# Patient Record
Sex: Male | Born: 1935 | Race: White | Hispanic: No | Marital: Married | Smoking: Never smoker
Health system: Southern US, Community
[De-identification: ages and names within clinical notes are randomized; demographics above are authoritative.]

## PROBLEM LIST (undated history)

## (undated) DIAGNOSIS — IMO0001 Reserved for inherently not codable concepts without codable children: Secondary | ICD-10-CM

## (undated) DIAGNOSIS — I639 Cerebral infarction, unspecified: Secondary | ICD-10-CM

## (undated) DIAGNOSIS — N189 Chronic kidney disease, unspecified: Secondary | ICD-10-CM

## (undated) DIAGNOSIS — I5022 Chronic systolic (congestive) heart failure: Secondary | ICD-10-CM

## (undated) DIAGNOSIS — I340 Nonrheumatic mitral (valve) insufficiency: Secondary | ICD-10-CM

## (undated) DIAGNOSIS — I2510 Atherosclerotic heart disease of native coronary artery without angina pectoris: Secondary | ICD-10-CM

## (undated) DIAGNOSIS — C801 Malignant (primary) neoplasm, unspecified: Secondary | ICD-10-CM

## (undated) DIAGNOSIS — K219 Gastro-esophageal reflux disease without esophagitis: Secondary | ICD-10-CM

## (undated) DIAGNOSIS — M7120 Synovial cyst of popliteal space [Baker], unspecified knee: Secondary | ICD-10-CM

## (undated) DIAGNOSIS — J90 Pleural effusion, not elsewhere classified: Secondary | ICD-10-CM

## (undated) DIAGNOSIS — Z85038 Personal history of other malignant neoplasm of large intestine: Secondary | ICD-10-CM

## (undated) DIAGNOSIS — I219 Acute myocardial infarction, unspecified: Secondary | ICD-10-CM

## (undated) DIAGNOSIS — H269 Unspecified cataract: Secondary | ICD-10-CM

## (undated) DIAGNOSIS — I10 Essential (primary) hypertension: Secondary | ICD-10-CM

## (undated) DIAGNOSIS — I509 Heart failure, unspecified: Secondary | ICD-10-CM

## (undated) DIAGNOSIS — L0390 Cellulitis, unspecified: Secondary | ICD-10-CM

## (undated) DIAGNOSIS — E785 Hyperlipidemia, unspecified: Secondary | ICD-10-CM

## (undated) DIAGNOSIS — J810 Acute pulmonary edema: Secondary | ICD-10-CM

## (undated) DIAGNOSIS — Z5189 Encounter for other specified aftercare: Secondary | ICD-10-CM

## (undated) DIAGNOSIS — D649 Anemia, unspecified: Secondary | ICD-10-CM

## (undated) DIAGNOSIS — I255 Ischemic cardiomyopathy: Secondary | ICD-10-CM

## (undated) DIAGNOSIS — F419 Anxiety disorder, unspecified: Secondary | ICD-10-CM

## (undated) DIAGNOSIS — I350 Nonrheumatic aortic (valve) stenosis: Secondary | ICD-10-CM

## (undated) HISTORY — DX: Ischemic cardiomyopathy: I25.5

## (undated) HISTORY — DX: Hyperlipidemia, unspecified: E78.5

## (undated) HISTORY — PX: CATARACT EXTRACTION: SUR2

## (undated) HISTORY — DX: Nonrheumatic aortic (valve) stenosis: I35.0

## (undated) HISTORY — PX: CARDIAC CATHETERIZATION: SHX172

## (undated) HISTORY — DX: Personal history of other malignant neoplasm of large intestine: Z85.038

## (undated) HISTORY — DX: Atherosclerotic heart disease of native coronary artery without angina pectoris: I25.10

## (undated) HISTORY — PX: COLONOSCOPY: SHX174

## (undated) HISTORY — PX: TONSILLECTOMY: SUR1361

## (undated) HISTORY — PX: ESOPHAGEAL DILATION: SHX303

## (undated) HISTORY — DX: Chronic systolic (congestive) heart failure: I50.22

## (undated) HISTORY — DX: Essential (primary) hypertension: I10

## (undated) HISTORY — DX: Cerebral infarction, unspecified: I63.9

---

## 1994-12-11 HISTORY — PX: COLON RESECTION: SHX5231

## 1998-11-06 ENCOUNTER — Inpatient Hospital Stay (HOSPITAL_COMMUNITY): Admission: EM | Admit: 1998-11-06 | Discharge: 1998-11-08 | Payer: Self-pay | Admitting: Emergency Medicine

## 1998-11-06 ENCOUNTER — Encounter: Payer: Self-pay | Admitting: Emergency Medicine

## 1998-11-07 ENCOUNTER — Encounter: Payer: Self-pay | Admitting: Family Medicine

## 1999-10-13 ENCOUNTER — Encounter (INDEPENDENT_AMBULATORY_CARE_PROVIDER_SITE_OTHER): Payer: Self-pay | Admitting: Specialist

## 1999-10-13 ENCOUNTER — Ambulatory Visit (HOSPITAL_COMMUNITY): Admission: RE | Admit: 1999-10-13 | Discharge: 1999-10-13 | Payer: Self-pay | Admitting: Gastroenterology

## 2000-09-10 DIAGNOSIS — I639 Cerebral infarction, unspecified: Secondary | ICD-10-CM

## 2000-09-10 HISTORY — DX: Cerebral infarction, unspecified: I63.9

## 2000-09-11 ENCOUNTER — Inpatient Hospital Stay (HOSPITAL_COMMUNITY): Admission: EM | Admit: 2000-09-11 | Discharge: 2000-09-13 | Payer: Self-pay | Admitting: Emergency Medicine

## 2000-09-12 ENCOUNTER — Encounter: Payer: Self-pay | Admitting: Neurology

## 2000-09-13 ENCOUNTER — Encounter: Payer: Self-pay | Admitting: Pediatrics

## 2000-09-14 ENCOUNTER — Inpatient Hospital Stay (HOSPITAL_COMMUNITY): Admission: EM | Admit: 2000-09-14 | Discharge: 2000-09-20 | Payer: Self-pay | Admitting: Emergency Medicine

## 2000-09-14 ENCOUNTER — Encounter: Payer: Self-pay | Admitting: Emergency Medicine

## 2000-09-15 ENCOUNTER — Encounter: Payer: Self-pay | Admitting: Neurology

## 2000-09-17 ENCOUNTER — Encounter: Payer: Self-pay | Admitting: Pediatrics

## 2000-09-20 ENCOUNTER — Inpatient Hospital Stay (HOSPITAL_COMMUNITY)
Admission: AD | Admit: 2000-09-20 | Discharge: 2000-09-27 | Payer: Self-pay | Admitting: Physical Medicine & Rehabilitation

## 2000-09-25 ENCOUNTER — Encounter: Payer: Self-pay | Admitting: Physical Medicine & Rehabilitation

## 2000-10-02 ENCOUNTER — Encounter
Admission: RE | Admit: 2000-10-02 | Discharge: 2000-10-25 | Payer: Self-pay | Admitting: Physical Medicine & Rehabilitation

## 2000-10-15 ENCOUNTER — Encounter: Payer: Self-pay | Admitting: Physical Medicine & Rehabilitation

## 2000-10-15 ENCOUNTER — Ambulatory Visit (HOSPITAL_COMMUNITY)
Admission: RE | Admit: 2000-10-15 | Discharge: 2000-10-15 | Payer: Self-pay | Admitting: Physical Medicine & Rehabilitation

## 2001-07-25 ENCOUNTER — Encounter: Payer: Self-pay | Admitting: Otolaryngology

## 2001-07-25 ENCOUNTER — Encounter: Admission: RE | Admit: 2001-07-25 | Discharge: 2001-07-25 | Payer: Self-pay | Admitting: Otolaryngology

## 2001-07-26 ENCOUNTER — Ambulatory Visit (HOSPITAL_BASED_OUTPATIENT_CLINIC_OR_DEPARTMENT_OTHER): Admission: RE | Admit: 2001-07-26 | Discharge: 2001-07-26 | Payer: Self-pay | Admitting: Otolaryngology

## 2002-12-13 ENCOUNTER — Encounter: Payer: Self-pay | Admitting: Emergency Medicine

## 2002-12-13 ENCOUNTER — Emergency Department (HOSPITAL_COMMUNITY): Admission: EM | Admit: 2002-12-13 | Discharge: 2002-12-13 | Payer: Self-pay | Admitting: Emergency Medicine

## 2004-07-01 ENCOUNTER — Encounter: Admission: RE | Admit: 2004-07-01 | Discharge: 2004-07-01 | Payer: Self-pay | Admitting: Otolaryngology

## 2004-07-04 ENCOUNTER — Ambulatory Visit (HOSPITAL_BASED_OUTPATIENT_CLINIC_OR_DEPARTMENT_OTHER): Admission: RE | Admit: 2004-07-04 | Discharge: 2004-07-04 | Payer: Self-pay | Admitting: Otolaryngology

## 2004-07-04 ENCOUNTER — Ambulatory Visit (HOSPITAL_COMMUNITY): Admission: RE | Admit: 2004-07-04 | Discharge: 2004-07-04 | Payer: Self-pay | Admitting: Otolaryngology

## 2007-12-12 HISTORY — PX: EYE SURGERY: SHX253

## 2008-01-21 ENCOUNTER — Encounter: Payer: Self-pay | Admitting: Family Medicine

## 2008-01-21 ENCOUNTER — Ambulatory Visit: Payer: Self-pay

## 2008-02-06 ENCOUNTER — Ambulatory Visit: Payer: Self-pay | Admitting: Cardiology

## 2009-02-03 ENCOUNTER — Encounter: Payer: Self-pay | Admitting: Cardiology

## 2009-02-03 ENCOUNTER — Ambulatory Visit: Payer: Self-pay | Admitting: Cardiology

## 2009-02-03 DIAGNOSIS — I359 Nonrheumatic aortic valve disorder, unspecified: Secondary | ICD-10-CM | POA: Insufficient documentation

## 2009-02-03 DIAGNOSIS — I119 Hypertensive heart disease without heart failure: Secondary | ICD-10-CM

## 2009-03-23 ENCOUNTER — Ambulatory Visit: Payer: Self-pay | Admitting: Vascular Surgery

## 2009-03-24 ENCOUNTER — Ambulatory Visit (HOSPITAL_COMMUNITY): Admission: RE | Admit: 2009-03-24 | Discharge: 2009-03-24 | Payer: Self-pay | Admitting: Ophthalmology

## 2009-11-24 ENCOUNTER — Encounter (INDEPENDENT_AMBULATORY_CARE_PROVIDER_SITE_OTHER): Payer: Self-pay | Admitting: *Deleted

## 2010-01-14 ENCOUNTER — Ambulatory Visit: Payer: Self-pay | Admitting: Cardiology

## 2010-01-27 ENCOUNTER — Ambulatory Visit (HOSPITAL_COMMUNITY): Admission: RE | Admit: 2010-01-27 | Discharge: 2010-01-27 | Payer: Self-pay | Admitting: Cardiology

## 2010-01-27 ENCOUNTER — Ambulatory Visit: Payer: Self-pay

## 2010-01-27 ENCOUNTER — Ambulatory Visit: Payer: Self-pay | Admitting: Cardiovascular Disease

## 2010-01-27 ENCOUNTER — Encounter: Payer: Self-pay | Admitting: Cardiology

## 2011-01-12 NOTE — Assessment & Plan Note (Signed)
Summary: F1Y/ANAS   Visit Type:  1 yr f/u Primary Provider:  Jacalyn Lefevre  CC:  no cardiac complaints today.  History of Present Illness: Mr Schroepfer returns today for evaluation and management of his moderate aortic stenosis. He is extremely active and denies any angina, presyncope or syncope with exertion, or significant dyspnea on exertion.  His blood pressure is 100 better control. He denies any palpitations.  Current Medications (verified): 1)  Prazosin Hcl 1 Mg Caps (Prazosin Hcl) .Marland Kitchen.. 1 Cap Two Times A Day 2)  Plavix 75 Mg Tabs (Clopidogrel Bisulfate) .Marland Kitchen.. 1 Tab Qd 3)  Metipranolol 0.3 % Soln (Metipranolol) .Marland Kitchen.. 1 Gtt Each Eye Once Daily 4)  Prilosec Otc 20 Mg Tbec (Omeprazole Magnesium) .Marland Kitchen.. 1 Tab Qd 5)  Amlodipine Besylate 5 Mg Tabs (Amlodipine Besylate) .Marland Kitchen.. 1 Tab Qam 6)  Metoprolol Succinate 50 Mg Xr24h-Tab (Metoprolol Succinate) .Marland Kitchen.. 1 Tab Qd 7)  Travatan Z 0.004 % Soln (Travoprost) .Marland Kitchen.. 1 Gtt Each Eye At Bedtime  Allergies (verified): No Known Drug Allergies  Past History:  Past Medical History: Last updated: 09-Feb-2009 Hypertension Significant for stroke on September 11, 2000 history of colon cancer, resection in 1996 dilatation of the esophagus in 2002 and 2005    Past Surgical History: Last updated: February 09, 2009 Cancer removed from colon 1995-1996? dilation of esophagus  Family History: Last updated: Feb 09, 2009 Mother: cancer..died @ 31 Father: MI.Marland Kitchendied @ 85 Siblings: Mental disorder..sister died @ 80 brother died @ 70.. diabetic and CHF  Social History: Last updated: 2009-02-09 Married  Tobacco Use - No.  Alcohol Use - no Drug Use - no  Risk Factors: Smoking Status: never (February 09, 2009)  Review of Systems       negative other than history of present illness  Vital Signs:  Patient profile:   77 year old male Height:      69 inches Weight:      171 pounds BMI:     25.34 Pulse rate:   59 / minute Pulse rhythm:   irregular BP sitting:    136 / 80  (left arm) Cuff size:   large  Vitals Entered By: Danielle Rankin, CMA (January 14, 2010 10:03 AM)  Physical Exam  General:  Well developed, well nourished, in no acute distress. Head:  normocephalic and atraumatic Eyes:  PERRLA/EOM intact; conjunctiva and lids normal. Neck:  Neck supple, no JVD. No masses, thyromegaly or abnormal cervical nodes. Chest Orlena Garmon:  no deformities or breast masses noted Lungs:  Clear bilaterally to auscultation and percussion. Heart:  aortic stenosis murmur, S2 difficult to hear split, regular rate and rhythm, PMI nondisplaced Abdomen:  Bowel sounds positive; abdomen soft and non-tender without masses, organomegaly, or hernias noted. No hepatosplenomegaly. Msk:  decreased ROM.   Pulses:  pulses normal in all 4 extremities Extremities:  No clubbing or cyanosis. Neurologic:  Alert and oriented x 3. Skin:  Intact without lesions or rashes. Psych:  Normal affect.   Impression & Recommendations:  Problem # 1:  AORTIC STENOSIS/ INSUFFICIENCY, NON-RHEUMATIC (ICD-424.1)  I will obtain a 2-D echocardiogram to evaluate his aortic stenosis. It is very difficult to hear his S2 split, but it clearly is not symptomatic at this point. His updated medication list for this problem includes:    Metoprolol Succinate 50 Mg Xr24h-tab (Metoprolol succinate) .Marland Kitchen... 1 tab qd  Orders: EKG w/ Interpretation (93000) Echocardiogram (Echo)  Problem # 2:  HYPERTENSION, HEART UNCONTROLLED W/O ASSOC CHF (ICD-402.00) Assessment: Improved  His updated medication list for this problem includes:  Prazosin Hcl 1 Mg Caps (Prazosin hcl) .Marland Kitchen... 1 cap two times a day    Amlodipine Besylate 5 Mg Tabs (Amlodipine besylate) .Marland Kitchen... 1 tab qam    Metoprolol Succinate 50 Mg Xr24h-tab (Metoprolol succinate) .Marland Kitchen... 1 tab qd  Patient Instructions: 1)  Your physician recommends that you schedule a follow-up appointment in: YEAR WITH DR Yousif Edelson  2)  Your physician recommends that you continue on  your current medications as directed. Please refer to the Current Medication list given to you today. 3)  Your physician has requested that you have an echocardiogram.  Echocardiography is a painless test that uses sound waves to create images of your heart. It provides your doctor with information about the size and shape of your heart and how well your heart's chambers and valves are working.  This procedure takes approximately one hour. There are no restrictions for this procedure.

## 2011-02-17 ENCOUNTER — Encounter (HOSPITAL_BASED_OUTPATIENT_CLINIC_OR_DEPARTMENT_OTHER)
Admission: RE | Admit: 2011-02-17 | Discharge: 2011-02-17 | Disposition: A | Discharge status: Home / Self Care | Payer: Medicare Other | Source: Ambulatory Visit | Attending: Otolaryngology | Admitting: Otolaryngology

## 2011-02-17 ENCOUNTER — Other Ambulatory Visit: Payer: Self-pay | Admitting: Otolaryngology

## 2011-02-17 ENCOUNTER — Ambulatory Visit
Admission: RE | Admit: 2011-02-17 | Discharge: 2011-02-17 | Disposition: A | Discharge status: Home / Self Care | Payer: Medicare Other | Source: Ambulatory Visit | Attending: Otolaryngology | Admitting: Otolaryngology

## 2011-02-17 DIAGNOSIS — Z01811 Encounter for preprocedural respiratory examination: Secondary | ICD-10-CM

## 2011-02-17 LAB — BASIC METABOLIC PANEL
Calcium: 9.4 mg/dL (ref 8.4–10.5)
GFR calc Af Amer: 43 mL/min — ABNORMAL LOW (ref >60)
GFR calc non Af Amer: 36 mL/min — ABNORMAL LOW (ref >60)
Glucose, Bld: 167 mg/dL — ABNORMAL HIGH (ref 70–99)
Sodium: 137 mEq/L (ref 135–145)

## 2011-02-21 ENCOUNTER — Ambulatory Visit (HOSPITAL_BASED_OUTPATIENT_CLINIC_OR_DEPARTMENT_OTHER)
Admission: RE | Admit: 2011-02-21 | Discharge: 2011-02-21 | Disposition: A | Discharge status: Home / Self Care | Payer: Medicare Other | Source: Ambulatory Visit | Attending: Otolaryngology | Admitting: Otolaryngology

## 2011-02-21 DIAGNOSIS — Z01812 Encounter for preprocedural laboratory examination: Secondary | ICD-10-CM | POA: Insufficient documentation

## 2011-02-21 DIAGNOSIS — R1310 Dysphagia, unspecified: Secondary | ICD-10-CM | POA: Insufficient documentation

## 2011-02-21 DIAGNOSIS — K222 Esophageal obstruction: Secondary | ICD-10-CM | POA: Insufficient documentation

## 2011-02-21 LAB — GLUCOSE, CAPILLARY: Glucose-Capillary: 112 mg/dL — ABNORMAL HIGH (ref 70–99)

## 2011-02-22 LAB — POCT HEMOGLOBIN-HEMACUE: Hemoglobin: 13.4 g/dL (ref 13.0–17.0)

## 2011-03-03 NOTE — Op Note (Signed)
  NAMETERIQUE, KAWABATA             ACCOUNT NO.:  [id number]  MEDICAL RECORD NO.:  [id number]            PATIENT TYPE:  LOCATION:                                 FACILITY:  PHYSICIAN:  Kristine Garbe. Ezzard Standing, M.D. DATE OF BIRTH:  DATE OF PROCEDURE:  02/21/2011 DATE OF DISCHARGE:                              OPERATIVE REPORT   PREOPERATIVE DIAGNOSIS:  Esophageal stricture.  POSTOPERATIVE DIAGNOSIS:  Esophageal stricture.  OPERATION:  Cervical esophagoscopy with dilation of esophageal stricture using the Savary dilators (dilated to a #20 Savary dilator).  SURGEON:  Kristine Garbe. Ezzard Standing, MD  ANESTHESIA:  General endotracheal.  COMPLICATIONS:  None.  BRIEF CLINICAL NOTE:  [name] [name] is an 77 year old gentleman who has had history of esophageal stricture status post dilations in 2002 and also 2005.  More recently, he has developed a progressive dysphagia with difficulty swallowing large amounts of food as well as pills.  He is taken to the operating room at this time for esophagoscopy and dilation of esophageal stricture.  DESCRIPTION OF PROCEDURE:  After adequate endotracheal anesthesia, the patient received 1 g Ancef IV preoperatively.  Cervical esophagoscopy was performed, a stricture was identified at approximate 16-17 cm from the upper lip.  The Savary guidewire was passed down through the stricture site, and then starting with a #8 Savary dilator the stricture was dilated sequentially up to a #20 Savary dilator without any difficulty.  Following dilation to a #20 Savary dilator, cervical esophagoscopy was performed and the area distal to the stricture was clear of any stricture or abnormalities.  This completed the procedure. The patient was awakened from anesthesia and transferred to recovery room postop doing well.  DISPOSITION:  Nuno was discharged home later this morning on amoxicillin suspension 400 mg b.i.d. for 1 week, Nexium 40 mg once a day for 4 weeks.   We will have him followup in my office in 2 weeks for recheck.          ______________________________ Kristine Garbe. Ezzard Standing, M.D.    CEN/MEDQ  D:  02/21/2011  T:  02/22/2011  Job:  [id number]  Electronically Signed by Dillard Cannon M.D. on 03/03/2011 11:11:56 AM

## 2011-03-17 ENCOUNTER — Encounter: Payer: Self-pay | Admitting: Cardiology

## 2011-03-23 ENCOUNTER — Ambulatory Visit (INDEPENDENT_AMBULATORY_CARE_PROVIDER_SITE_OTHER): Payer: Medicare Other | Admitting: Cardiology

## 2011-03-23 ENCOUNTER — Encounter: Payer: Self-pay | Admitting: Cardiology

## 2011-03-23 VITALS — BP 220/80 | HR 54 | Resp 14 | Ht 69.0 in | Wt 146.0 lb

## 2011-03-23 DIAGNOSIS — I119 Hypertensive heart disease without heart failure: Secondary | ICD-10-CM

## 2011-03-23 DIAGNOSIS — I359 Nonrheumatic aortic valve disorder, unspecified: Secondary | ICD-10-CM

## 2011-03-23 DIAGNOSIS — I6529 Occlusion and stenosis of unspecified carotid artery: Secondary | ICD-10-CM

## 2011-03-23 NOTE — Assessment & Plan Note (Signed)
Stable with recent Echo showing mild AS. Follow clinically.

## 2011-03-23 NOTE — Progress Notes (Signed)
   Patient ID: [name] [name], male    DOB: 26-Feb-1926, 77 y.o.   MRN: [id number]  HPI  [name] [name] returns for E and M of his aortic valve disease. He denies any angina, DOE, or syncope. He has well controlled blood pressure at home. It is almost always high in the office. Last Echocardiogram was 2/11 which showed only mild AS, moderate LVH, no AI. EF was 60%.    Review of Systems  All other systems reviewed and are negative.      Physical Exam  Nursing note and vitals reviewed. Constitutional: He is oriented to person, place, and time. He appears well-developed and well-nourished. No distress.  HENT:  Head: Normocephalic and atraumatic.  Eyes: EOM are normal. Pupils are equal, round, and reactive to light.  Neck: Normal range of motion. Neck supple. No JVD present. No tracheal deviation present. No thyromegaly present.       Bruits versus referred sounds  Cardiovascular: Regular rhythm, S1 normal, intact distal pulses and normal pulses.   No extrasystoles are present. Bradycardia present.  PMI is not displaced.  Exam reveals no S4.   Murmur heard.  Crescendo systolic murmur is present with a grade of 2/6   No diastolic murmur is present  Pulmonary/Chest: Effort normal and breath sounds normal. He has no rales.  Abdominal: Soft. Bowel sounds are normal.       No midline bruit  Musculoskeletal: He exhibits no edema.  Neurological: He is alert and oriented to person, place, and time.  Skin: Skin is warm and dry.  Psychiatric: He has a normal mood and affect.

## 2011-03-23 NOTE — Patient Instructions (Signed)
Your physician recommends that you schedule a follow-up appointment in: 1 YEAR WITH DR. WALL  Your physician has requested that you have a carotid duplex 433.10. This test is an ultrasound of the carotid arteries in your neck. It looks at blood flow through these arteries that supply the brain with blood. Allow one hour for this exam. There are no restrictions or special instructions.

## 2011-04-05 ENCOUNTER — Encounter (INDEPENDENT_AMBULATORY_CARE_PROVIDER_SITE_OTHER): Payer: Medicare Other | Admitting: *Deleted

## 2011-04-05 DIAGNOSIS — I6529 Occlusion and stenosis of unspecified carotid artery: Secondary | ICD-10-CM

## 2011-04-05 DIAGNOSIS — R0989 Other specified symptoms and signs involving the circulatory and respiratory systems: Secondary | ICD-10-CM

## 2011-04-10 ENCOUNTER — Encounter: Payer: Self-pay | Admitting: Cardiology

## 2011-04-18 ENCOUNTER — Telehealth: Payer: Self-pay | Admitting: *Deleted

## 2011-04-18 NOTE — Telephone Encounter (Signed)
Message copied by Lisabeth Devoid on Tue Apr 18, 2011  3:02 PM ------      Message from: Valera Castle      Created: Thu Apr 13, 2011  9:16 AM       Stable, ASA, Followup in 2 years

## 2011-04-18 NOTE — Telephone Encounter (Signed)
Pt aware of carotid results Debbie Nissi Doffing RN  

## 2011-04-25 NOTE — Assessment & Plan Note (Signed)
[facility]                            CARDIOLOGY OFFICE NOTE   [name] [name]                      MRN:          [id number]  DATE:02/06/2008                            DOB:          21-Dec-1925    Was asked by Dr. Bradd Canary to consult on [name] [name] with a heart  murmur.   HISTORY OF PRESENT ILLNESS:  [name] [name] is an 77 year old married  white male who has had no previous cardiac condition.  He has carried a  diagnosis of hypertension for some 30 years.   He was recently seen by Dr. Artis Flock who heard a heart murmur.   He denies any exertional dyspnea, angina, presyncope or syncope with  exertion.  He denies orthopnea, PND or peripheral edema.   PAST MEDICAL HISTORY:  Significant for stroke on September 11, 2000.  He is  on Plavix ever since and has done well.  He has no remaining dysfunction  or disability.  He has also had a history of colon cancer, resection in  1996.  He has had dilatation of the esophagus in 2002 and 2005.   His current meds are prazosin 1 mg p.o. b.i.d. Plavix 75 mg a day,  metipranolol eyedrops Prilosec over-the-counter, fluorouracil cream,  amlodipine 5 mg a day, metoprolol succinate 50 mg a day and Travatan.   He has no known drug allergies.   His family history is noncontributory.   SOCIAL HISTORY:  Retired.  He is married.  Three children.  Wife is with  him today.   He does not smoke, does not drink.   REVIEW OF SYSTEMS:  All 15 points of care reviewed and only significant  for previous ulcer, gastroesophageal reflux, and the above HPI.  He has  borderline diabetes.   EXAM:  Today his blood pressure is 151/81, pulse is 53 and sinus brady  by EKG.  Weight is 220.  He is 5 feet 9 inches.  He looks younger than stated age.  Face shows diffuse erythema from his cream for his skin cancers.  PERLA.  Extraocular intact.  He has arcus senilis.  Sclerae are slightly  injected.  Facial symmetry is normal.   Dentition satisfactory.  Neck is supple.  Carotids were equal bilaterally with a soft systolic  sound the right neck.  There is no bruit on the left.  LUNGS:  Clear.  HEART:  Reveals a nondisplaced PMI.  Soft aortic stenosis murmur along  left sternal border.  Difficult to hear S2 split.  There is no gallop.  There is no lift.  ABDOMEN:  Soft, good bowel sounds.  No midline bruits.  EXTREMITIES:  No cyanosis, clubbing or edema.  Pulses are intact.  NEURO:  Exam is intact.   Electrocardiogram shows sinus bradycardia rate of 53, a left axis  deviation with some LVH with strain pattern.   I had a nice chat with [name] [name] and his wife.  I have reviewed 2-D  echocardiogram that had been ordered prior to this visit which shows  mild to moderate aortic stenosis.  He has calcification but his mean  gradient is only 15 mmHg.  His estimated valve area was greater than 1.  He has mild mitral regurgitation.  Mild left atrial dilatation.  EF is  55 6%.  There was no significant LVH.   ASSESSMENT/PLAN:  1. Mild to moderate aortic stenosis which is currently asymptomatic.  2. Hypertension.  3. Sinus bradycardia.  4. History of a previous stroke.  5. Diet-controlled diabetes.  6. Other problems as listed above.   RECOMMENDATIONS:  At the present time I have asked [name] [name] to see  [place] back on annual basis.  He may never need to have any significant  intervention of his aortic valve.  We talked about the classic warning  signs of dyspnea on exertion, exertional angina, or presyncope or  syncope.  If any of those happen, I want to see him back sooner than a  year.  Otherwise will see him back at that time.     Thomas C. Daleen Squibb, MD, [facility]  Electronically Signed    TCW/MedQ  DD: 02/06/2008  DT: 02/07/2008  Job #: [id number]   cc:   Quita Skye. Artis Flock, M.D.

## 2011-04-28 NOTE — H&P (Signed)
[place]  Patient:    [name] [name], [name] [name]                      MRN: [id number] Adm. Date:  [id number] Attending:  Erich Montane                         History and Physical  PATIENTS ADDRESS [address] [place], [place]  [address]  DATE OF BIRTH:  [birth year].  REASON FOR ADMISSION:  This is one of several [facility] admissions for this 77 year old right-handed white married male from [place], [place], discharged 24 hours ago, and now readmitted for recurrent right-sided weakness.  HISTORY OF PRESENT ILLNESS:  Mr. Withey has a 30-year history of high blood pressure, without known diabetes mellitus, heart disease, stroke or previous neurologic symptoms.  On September 11, 2000, he developed the onset of right foot and leg dragging, stuttering speech, and blurred vision and was admitted to [facility] for IV heparin therapy.  At that time, his Doppler studies showed right internal carotid artery stenosis of 40-60%, with normal vertebral antegrade flow and normal left ICA flow.  An extracranial MRA showed no significant stenosis of the right internal carotid artery.  There was evidence of sinus bradycardia and nonspecific ST-T wave changes on his EKG.  An MRI of the brain showed atrophy and small vessel disease.  The diffusion images were negative for an acute abnormality.  There was decreased caliber to the left internal carotid artery in its vertical petrous and cavernous segments and the MRA of the brain showed evidence of the left internal carotid artery to have a long segment of 50% stenosis and intracranially, there was severe atherosclerotic disease in the middle cerebral artery branches on the left, with lack of flow into the distal branches of the left middle cerebral artery distal to the trifurcation; there was also stenosis of the posterior cerebral arteries bilaterally.  A 2-D  echocardiogram was unremarkable and a glycosylated hemoglobin A1c was 7.1, which is elevated.  His cholesterol was 138 with triglycerides of 111. HDL cholesterol was 25.  LDL was 91.  He had low HDLs.  In the hospital, he was placed on IV heparin therapy and subsequently was placed on Plavix and aspirin.  He was discharged on September 13, 2000.  At home this day, he was noted to have difficulty with slurred speech, increasing right-sided weakness, without associated headache, and a tendency to fall to his right.  There was no chest pain or palpitations and he came to the emergency room for further evaluation.  PAST MEDICAL HISTORY:  His past medical history is significant for hypertension for 30 years, left brain TIA, September 11, 2000, colon cancer, status post surgery in 1995, and appendectomy in 1945.  MEDICINES AT TIME OF ADMISSION 1. Plavix 75 mg q.d. 2. Prilosec 40 mg p.o. q.d. 3. Aspirin 325 mg q.d. 4. Prazosin 1 mg p.o. b.i.d.  FAMILY HISTORY:  His mother died at 39 from oral cancer.  His father died at 69 from myocardial infarction.  He has one brother, 68, living and well.  He has a brother, 60, with heart problems.  He has a sister, 33, and another sister, 65, living and well.  He has three children, a son, 4, and daughters, 85 and 90, living and well.  HABIT HISTORY:  He does not smoke cigarettes.  He does not drink  alcohol.  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  ALLERGIES:  He has no allergies.  PHYSICAL EXAMINATION  GENERAL:  Examination revealed a well-developed, pleasant white male with blood pressure in the right arm 190/80, left arm 180/80.  Heart rate was 60. He was afebrile.  Respiratory rate was 18.  NEUROLOGIC:  He was alert and oriented x 3.  He could name objects.  There was no evidence of any aphasia.  He had stuttering speech.  His cranial nerve examination revealed visual fields to be full.  The disks were flat.   The extraocular movements were full.  There was no seventh nerve palsy.  Tongue was midline.  The uvula was midline.  Gags were present.  Sternocleidomastoid and trapezius testing were normal.  Motor examination revealed right hand distal-greater-than-proximal drift with decreased rapid alternating movement skills in the right hand.  His sensory examination was intact to pinprick, touch, ______ and vibration testing.  Deep tendon reflexes were 2+ and plantar responses were downgoing.  HEENT:  Examination revealed an old hole in the left tympanic membrane.  NECK:  There was a loud right carotid bruit heard.  The neck was supple.  LUNGS:  Clear to auscultation.  HEART:  No murmurs.  ABDOMEN:  There was no enlargement of the liver, spleen or kidneys.  GU:  He was uncircumcised.  RECTAL:  Examination was not done since it is not pertinent to the present illness.  IMAGING STUDY:  CT scan of the brain showed ischemic stroke of the small posterior limb of the internal capsule.  IMPRESSION 1. Left brain stroke, code 434.01. 2. 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 3. Hypertension, code 796.2. 4. Colon cancer, code 153.9. 5. Right carotid bruit, code 785.9.  PLAN:  Plan at this time is to admit the patient for heparin therapy. DD:  09/14/00 TD:  09/15/00 Job: [id number]

## 2011-04-28 NOTE — Discharge Summary (Signed)
[place]  Patient:    [name] [name], [name] [name]                      MRN: [id number] Adm. Date:  [id number] Disc. Date: 09/19/00 Attending:  Erich Montane                           Discharge Summary  ADMISSION DIAGNOSES: 1. New onset of left brain stroke with aphasia and right hemiparesis. 2. History of hypertension.  DISCHARGE DIAGNOSES: 1. Left brain stroke secondary to distal middle cerebral artery occlusion. 2. History of hypertension. 3. History of colon cancer.  PROCEDURE: 1. CT scan of the brain. 2. MRI scan of the brain. 3. MRI angiogram. 4. Two-dimensional echocardiogram. 5. Cerebral angiogram.  COMPLICATIONS:  None.  HISTORY OF PRESENT ILLNESS:  [name] [name] is a 77 year old white gentleman born 1926/11/10, with a history of hypertension.  This patient presents following a recent evaluation for a TIA-type event on September 11, 2000.  This patient at that time had positive right foot and leg dragging, stuttering speech, and blurred vision.  The patient was placed on heparin therapy at that point and had Doppler study showing right internal carotid artery stenosis at 40 to 60%, vertebral artery flow was antegrade, normal left internal carotid artery flow.  An extracranial MRI angiogram showed no significant stenosis of the right internal carotid artery and MRI of the brain showed atrophy with small vessel disease.  A diffusion weight image was negative at that time. There was some evidence of decreased caliber of the left internal carotid artery in the petrous segment.  MRI angiogram intracranially showed about 50% stenosis of the carotid artery intracranially atherosclerotic disease effecting the middle cerebral branches on the left.  Two-dimensional echocardiogram was roughly unremarkable at that point.  The patient was discharged on Plavix and aspirin and was being treated with prazosin for his hypertension.  The patient,  however, came back several days after discharge with new onset of right-sided hemiparesis, aphasia, gait disturbance.  The patient was admitted for further evaluation.  PAST MEDICAL HISTORY:  Significant for: 1. History of hypertension. 2. Left brain transient ischemic attack on September 11, 2000. 3. History of colon cancer, status post surgical resection in 1995. 4. History of appendectomy in 1945.  MEDICATIONS: 1. Plavix 75 mg a day. 2. Prilosec 40 mg a day. 3. Aspirin 325 mg daily. 4. Prazosin 1 mg b.i.d.  ALLERGIES:  No known drug allergies.  SOCIAL HISTORY:  He does not smoke or drink.  Please refer to history and physical dictation for social history, family history, review of systems, and physical examination.  LABORATORY DATA:  Notable for white count of 6.0, hemoglobin 13.0, hematocrit 36.9, MCV 90.4, platelets 138.  Coags were normal on admission.  Sodium 138, potassium 3.9, chloride 106, CO2 25, glucose 121, BUN 17, creatinine 1.1, calcium 9.1, total protein 6.4, albumin 3.4, AST 36, ALT 31, ALP 49, total bilirubin 1.1.  CT scan of the brain on admission showed a nonhemorrhagic, left thallamic/internal capsular infarct.  Two-dimensional echocardiogram that apparently was done on the prior admission showed evidence of no aortic stenosis, moderate aortic sclerosis, no tricuspid regurgitation, normal tricuspid valve, no mitral reguritation, normal mitral valve, left ventricular hypertrophy was seen, normal left ventricular systolic function, no source of intracardial thrombi or emboli were seen.  EKG revealed sinus bradycardia with a heart rate of 54, otherwise normal EKG  was noted.  HOSPITAL COURSE:  The patient has done well during the course of hospitalization.  The patient was admitted for an evaluation of stroke event involving the left brain.  The patient has had right hemiparesis of face and arm greater than leg.  The patient underwent an evaluation for  possible intracranial disease.  The patient was placed on heparin.  Cerebral angiogram was performed showing evidence of an occluded inferior division branch of the left middle cerebral artery, approximately 40% stenosis of the superior division of the left middle cerebral, approximately 50% stenosis of the right posterior cerebral artery just distal to its origin with some focal stenosis distally to this as well.  The left posterior frontal and anterior parietal regions were being supplied to the distal branches of the left close marginal artery and some partial reconstitution of the angular distribution of the left middle cerebral artery was seen.   The patient was seen and evaluated by physical, occupational, and speech therapy.  A barium swallow study was performed showing tracheal aspiration of honey and lectar-thick liquids.  The patient was placed on pudding consistency diet, excluding liquids currently. This may need to be repeated in the future.  The patient was taken off of heparin following the cerebral angiogram and was placed on Plavix.  This patient has done well so far and rehabilitation has been consulted. Rehabilitation felt that he would be an excellent candidate for acute inpatient rehabilitation and will be transferred when a bed is available.  At the time of discharge, the patient is bright, alert, and cooperative.  Mild aphasia is noted.  Weakness of the right arm and face greater than leg is noted.  The patient is ambulatory with minimal assistance.  The patient has weakness of the right arm.  At the time of this dictation, the patient currently is on subcu heparin as per recommendations by rehabilitation.  He is on Plavix 75 mg a day, Pepcid 20 mg q.12h., prazosin 1 mg daily.  This may need eventually to be increased to 1 mg twice a day.  The patient is medically ready for transfer to rehabilitation unit at this point. DD:  09/19/00 TD:  09/19/00 Job:  [id number]

## 2011-04-28 NOTE — Op Note (Signed)
[place]  Patient:    [name] [name], [name] [name]                                Visit Number: [id number] MRN: [id number]          Service Type: DSU Location: [facility] Attending:  Carlean Purl Proc. Date: 07/26/01 Adm. Date:  [id number]   CC:         Stanley C. Andrey Campanile, M.D.   Operative Report  PREOPERATIVE DIAGNOSIS:  Dysphagia secondary to upper cervical esophageal stricture.  POSTOPERATIVE DIAGNOSIS:  Dysphagia secondary to upper cervical esophageal stricture.  PROCEDURE:  Direct laryngoscopy, cervical esophagoscopy with dilation of cervical esophageal stricture with Savary dilators (dilated to #20).  SURGEON:  Kristine Garbe. Ezzard Standing, M.D.  ANESTHESIA:  General endotracheal.  COMPLICATIONS:  None.  BRIEF CLINICAL NOTE:  [name] [name] is a 77 year old gentleman who has had long-standing problems with dysphagia on examination and barium swallow. 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 Gastroenterology felt that this would be better dilated under a general anesthetic versus in the endoscopy suite.  He is taken to the operating room at this time for dilation of proximal cervical esophageal stricture.  DESCRIPTION OF PROCEDURE:  After adequate anesthesia, first direct laryngoscopy was performed.  The base of tongue, epiglottis, piriform sinuses were clear.  Vocal cords likewise were clear.  Next, the cervical esophagoscope was passed.  On examination, both piriform sinuses were clear, but on attempting to enter the cervical esophagus there was a very tight stricture, less than 8 mm in size.  I was able to pass the smallest Pilling dilator through the cervical esophagoscope and down through the small opening to the cervical esophagus.  It was elected to use the Savary dilators.  The guidewire was passed down to the small opening first and then using the sequential dilators starting with a #8, the strictured area  was dilated up to #20 with the Savary dilators.  After dilating the cervical stricture, rigid esophagoscopy was performed.  The esophagoscope could now be passed through the strictured area, and the more distal esophagus appeared normal.  There was a moderate amount of bleeding around the dilated strictured area where the mucosa was torn, but there was no evidence of perforation.  This completed the procedure.  The patient was awoken from anesthesia and transferred to the recovery room postop doing well.  DISPOSITION:  [name] [name] is discharged home later this morning.  He was instructed to double his dose of Prilosec to 20 mg twice a day for the next two weeks to help reduce any incidence of reflux disease.  He is instructed to take a liquid diet today and advance to a regular diet tomorrow.  Will continue with his regular medications, and he will have a follow-up appointment in my office in two weeks for recheck. DD:  07/26/01 TD:  07/26/01 Job: [id number]

## 2011-04-28 NOTE — Op Note (Signed)
[name] [name], [name] [name]                         ACCOUNT NO.:  [id number]   MEDICAL RECORD NO.:  [id number]                   PATIENT TYPE:  AMB   LOCATION:  DSC                                  FACILITY:  [facility]   PHYSICIAN:  Christopher E. Ezzard Standing, M.D.         DATE OF BIRTH:  March 26, 1926   DATE OF PROCEDURE:  07/04/2004  DATE OF DISCHARGE:                                 OPERATIVE REPORT   PREOPERATIVE DIAGNOSIS:  Cervical esophageal stricture with a dysphagia.   POSTOPERATIVE DIAGNOSIS:  Cervical esophageal stricture with a dysphagia.   OPERATION:  Direct laryngoscopy, rigid esophagoscopy with dilation of  cervical esophageal stricture with Savory dilators.   SURGEON:  Dr. Narda Bonds.   ANESTHESIA:  General.   COMPLICATIONS:  None.   BRIEF CLINICAL NOTE:  [name] [name] is a 77 year old gentleman who has had  a history of dysphagia in the past requiring dilation of the cervical  esophagus from a small stricture last performed in July of 2002.  He had  dysphagia for a number of years but in 2002, was having significant problems  where a barium swallow showed a proximal esophageal web.  Dr. Randa Evens  recommended dilation of this under direct esophagoscopy.  He has done well  for about three years but over the last year, has gradually gotten worse.  He was taken to the operating room at this time for dilation of cervical  esophageal stricture.   DESCRIPTION OF PROCEDURE:  After adequate endotracheal anesthesia, first a  direct laryngoscopy was performed.  The base of the tongue, epiglottis were  all normal.  Evaluation of the endolarynx, vocal cords were clear.  The A.  folds were clear.  Both piriform sinuses were clear.  On examination of the  opening of the esophagus, there was dense scar tissue with a small opening  to the cervical esophagus approximately 3-4 mm in size.  Using the cervical  esophagoscope, this was positioned to visualize the stricture site.  The  wire  guide of the Savory dilator was then passed through the small opening  of the cervical esophagus and passed down to where the wire 30 cm mark was  at the lips.  Then starting with the 6 mm Savory dilator, thrusts of  dilations of the esophagus stricture were performed.  We were able to dilate  them up to 20 mm using the [facility] dilators in a sequential manner.  Following dilation with a 20 mm Savory dilator, esophagoscopy was performed  again.  This time was able to easily pass the cervical esophagoscope through  the strictured area down into the cervical esophagus.  There were some  abrasions where the dilation occurred and of the proximal cervical esophagus  but the mucosa distal to this was otherwise intact and appeared normal.  This completed the procedure.  [name] [name] was awoken from anesthesia and  transferred to the recovery room postoperatively doing well.   DISPOSITION:  Durelle was  discharged home later this morning on Tylenol and  Tylenol #3 p.r.n. pain.  He was instructed to stay on liquids for the next  24 hours and then advanced to a regular diet over the next two or three  days.  I will have him follow up in my office in 7-10 days for recheck.  He  will notify us if he had any significant fever or severe pain.                                               Kristine Garbe. Ezzard Standing, M.D.    CEN/MEDQ  D:  07/04/2004  T:  07/04/2004  Job:  [id number]

## 2011-04-28 NOTE — Discharge Summary (Signed)
[place]  Patient:    [name] [name], [name] [name]                      MRN: [id number] Adm. Date:  [id number] Disc. Date: [id number] Attending:  Fenton Malling CC:         [place]. Andrey Campanile, M.D.   Discharge Summary  DATE OF BIRTH:  12-Feb-1926  FINAL DIAGNOSES: 1. Transient ischemic attack left brain (435.8). 2. Hypertension without congestive heart failure (404.10). 3. Gastroesophageal reflux disease. 4. Mild hyperglycemia, question glucose intolerance versus incipient    diabetes. 5. Bradycardia, unknown etiology.  PROCEDURES:  MRI brain, MRA intracranial, carotid Doppler, 2-D echocardiogram.  COMPLICATIONS:  None.  SUMMARY OF HOSPITALIZATION:  The patient did well in the hospital and did not have any further TIA-like events.  He had experienced vision change, weakness, and clumsiness of the right foot and dizziness on the day of admission that lasted for a few minutes.  It appeared that his right-sided visual loss was a visual field deficit rather than a monocular problem.  His symptoms resolved within 30 to 35 minutes.  Risk factors included hypertension.  Other medical problems included gastroesophageal reflux disease, history of esophageal bleeding, colon cancer with resection without recurrence.  There is a family history of coronary artery disease in his father and his sister.  During the hospitalization, the patient had normal examination.  Laboratory data as follows:  MRI of the brain showed mild central and cortical atrophy, small vessel disease in the left corona radiata that was patchy.  No acute lesions were seen.  MR angiography: Hyperplastic left internal carotid with decreased flow in the left parietal region, particularly in the distal M1 segment and trifurcation. There was also atherosclerotic narrowing of the right P1 segment.  An MRA of the neck was performed today which failed to show evidence of  significant stenosis either of the right or left internal carotid arteries.  Carotid Dopplers suggested a 40 to 60% stenosis of the right internal carotid artery, but this must have been related to tortuosity because no obstruction was seen on the basis of MRI criteria.  Left internal carotid was normal. Vertebrals were antegrade.  There was evidence of noncalcific plaque at the bifurcation proximal internal and external carotid arteries on the right and the left.  There was noncalcific plaque at the bifurcation.  A 2-D echocardiogram performed this evening by Dr. Corliss Marcus showed evidence of left ventricular hypertrophy.  He said that there were no other significant abnormalities.  EKG showed a sinus bradycardia with nonspecific ST-T wave abnormalities.  CBC: White count 4900, hemoglobin 13.3, hematocrit 37.9, MCV 91.7, platelet count 160,000, 54 polys, 36 lymphs, 5 monos, 2 eosinophils, 3 AUC.  Initial pH 7.415.  Basic metabolic panel: Sodium 140, potassium 4.1, chloride 106, CO2 30, glucose 139, BUN 11, creatinine 1.1, calcium 8.8.  The patient had not been placed on IV fluids.  Hemoglobin A1C 7.1, slightly elevated.  Total cholesterol 138.  Triglycerides 111, HDL fraction 25, LDL 91.  This is a little less than 6:1 which is about average risk for a male.  The patient was placed on heparin, had no further symptoms.  Heparin was tapered and discontinued today, and he will be sent home on aspirin 325 mg per day and Plavix 75 mg per day.  The patient is at risk for further stroke in the left brain.  Should he have further TIA events, we would need  to consider switching to Coumadin.  I do not know if that will add additional benefit to him.  It certainly would increase his risk.  I am not willing to assume that until such time as it is necessary to do so.  I discussed this with the patient, and he is in agreement.  The patient is to follow up with Dr. Karma Ganja for secondary  stroke prevention which includes treatment with antiplatelet drugs, careful titration of his blood pressure, and consideration of oral hypoglycemic agents, although his glucose is not markedly elevated, and hemoglobin A1C is only mildly so, also consideration of use of "statin" medications such as Pravachol given his low HDL.  I appreciate the opportunity to see the patient and will see him back in followup at Dr. Julien Nordmann request. DD:  09/13/00 TD:  09/14/00 Job: [id number]

## 2011-04-28 NOTE — H&P (Signed)
[place]  Patient:    [name] [name], [name] [name]                      MRN: [id number] Adm. Date:  [id number] Attending:  Fenton Malling                         History and Physical  DATE OF BIRTH: [birth year]  CHIEF COMPLAINT: Right-sided weakness and visual changes.  HISTORY OF PRESENT ILLNESS: Mr. Spence Soberano is a 77 year old white male with a past medical history of hypertension, gastroesophageal reflux disease, history of colon carcinoma, and history of GI bleeding, who was in his usual state of health until about 2 p.m. today when he while shopping in a store began to notice he was dragging his right foot.  He also noticed that objects in his right field of vision seemed to have a blurred quality as if there was a translucent film across his right visual field.  He initially indicated that this was out of his right eye but he did not do any testing of his eyes independently and on closer questioning it is clear that this is actually a visual field symptom.  He noticed no problems in his right hand.  He reports he may have had slurred speech but denies any problems with word finding.  His wife accompanied him out of the store and they went home and called his primary physician, Dr. Andrey Campanile, who advised them to come in.  He was still symptomatic upon going to Dr. Julien Nordmann office but by the time he left the office his symptoms had resolved.  Total duration was 30-35 minutes.  He came to the ER and has had no further spells.  He did note that while he was in the store he felt dizzy and as though he was "going to pass out", but does not describe a vertiginous sensation.  He has had no further episodes since his arrival and denies any history of previous similar episodes.  PAST MEDICAL HISTORY:  1. Hypertension, under fairly good control.  2. Gastroesophageal reflux disease.  3. History of colon cancer, status post surgery in 1995 with no  evidence of     recurrence.  4. History of GI bleeding.  SOCIAL HISTORY: He is married and [place]  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  He denies alcohol, tobacco, or illicit drug use.  FAMILY HISTORY: Mother died at age 47 of oral cancer.  Father died at age 16 with coronary disease.  He has a sister with coronary disease at age 48 and a brother who was mentally retarded and died early.  He also has a brother, sister, and other children who are alive and well.  ALLERGIES: No known drug allergies.  MEDICATIONS:  1. Prilosec.  2. Prazosin.  No antiplatelet agents.  REVIEW OF SYSTEMS: The patient denies fever or chills, weight loss, sweats, headaches, numbness, dementia, Parkinsons, neuromuscular disorder, prior head injury, nausea, vomiting, melena, hematochezia, diarrhea, constipation, swelling, cyanosis, flushing, or pallor.  PHYSICAL EXAMINATION:  VITAL SIGNS: Temperature 98 degrees, blood pressure 154/83, pulse 59, respirations 16.  Oxygen saturation 96% on room air.  GENERAL: He is alert and in no evident distress.  HEENT: Head normocephalic, atraumatic.  Oropharynx benign.  NECK: Supple, without adenopathy or bruits.  No JVD.  HEART: Bradycardic and  irregular, without murmurs, rubs, or gallops.  CHEST: Clear to auscultation bilaterally.  ABDOMEN: Normoactive bowel sounds.  Soft, nontender, nondistended.  EXTREMITIES: No clubbing, cyanosis, or edema.  Peripheral pulses 2+.  GU/RECTAL: Prostate benign.  Normal sphincter tone.  Hemoccult negative stool.  SKIN: No rash.  NEUROLOGIC: Mental status shows he is awake and alert and fully oriented.  He is able to give a clear and concise history.  Speech is fluent and not dysarthric.  Mood is euthymic and affect appropriate.  He scores 30/30 on the mini mental status examination.  Cranial nerves show funduscopic examination is  benign.  Visual acuity is 20/25 OU.  Pupils are equal and briskly reactive. Extraocular movements are intact without nystagmus.  Visual fields are full to confrontation.  Facial strength and sensation are normal.  Tongue and palate move well and are in the midline.  Motor examination shows normal bulk and tone, no atrophy or fasciculations.  Normal strength in all tested extremity muscles.  Sensation intact to light touch in all extremities.  Cerebellar shows rapid alternating movements normal.  Finger-to-nose normal.  Gait normal.  He is able to heel and toe walk.  Reflexes 2+ and symmetric.  Toes are downgoing.  LABORATORY DATA: CBC showed a WBC of 4.9, hemoglobin 13.3, platelets 160,000. BMET is remarkable for a glucose of 151.  Coags are normal.  EKG shows sinus bradycardia with a rate of 55.  No acute injury current.  IMPRESSION:  1. Left hemispheric transient ischemic attack with right transient visual     field defects and weakness.  This may be due to posterior circulation     event.  2. Hyperglycemia, without history of diabetes.  3. Hypertension, fairly well controlled.  4. Gastroesophageal reflux disease.  PLAN:  1. Admit to stroke team.  2. Heparin per protocol.  3. Carotid Dopplers.  4. Echocardiogram.  5. MRI with MRA of intracerebral vessels.  6. Repeat BMET, check hemoglobin A1C, TSH, and lipids in the morning.  7. Disposition pending above. DD:  09/11/00 TD:  09/12/00 Job: [id number]

## 2011-12-12 DIAGNOSIS — I219 Acute myocardial infarction, unspecified: Secondary | ICD-10-CM

## 2011-12-12 HISTORY — DX: Acute myocardial infarction, unspecified: I21.9

## 2011-12-13 ENCOUNTER — Other Ambulatory Visit: Payer: Self-pay

## 2011-12-13 ENCOUNTER — Encounter (HOSPITAL_COMMUNITY): Payer: Self-pay

## 2011-12-13 ENCOUNTER — Inpatient Hospital Stay (HOSPITAL_COMMUNITY)
Admission: EM | Admit: 2011-12-13 | Discharge: 2011-12-23 | DRG: 280 | Disposition: A | Discharge status: Home Health | Payer: Medicare Other | Attending: Cardiology | Admitting: Cardiology

## 2011-12-13 ENCOUNTER — Inpatient Hospital Stay (HOSPITAL_COMMUNITY): Payer: Medicare Other

## 2011-12-13 ENCOUNTER — Emergency Department (HOSPITAL_COMMUNITY): Payer: Medicare Other

## 2011-12-13 ENCOUNTER — Encounter (HOSPITAL_COMMUNITY)
Admission: EM | Disposition: A | Discharge status: Home Health | Payer: Self-pay | Source: Home / Self Care | Attending: Cardiology

## 2011-12-13 DIAGNOSIS — J9600 Acute respiratory failure, unspecified whether with hypoxia or hypercapnia: Secondary | ICD-10-CM | POA: Diagnosis not present

## 2011-12-13 DIAGNOSIS — I959 Hypotension, unspecified: Secondary | ICD-10-CM | POA: Diagnosis not present

## 2011-12-13 DIAGNOSIS — Z7982 Long term (current) use of aspirin: Secondary | ICD-10-CM

## 2011-12-13 DIAGNOSIS — E785 Hyperlipidemia, unspecified: Secondary | ICD-10-CM | POA: Diagnosis present

## 2011-12-13 DIAGNOSIS — E119 Type 2 diabetes mellitus without complications: Secondary | ICD-10-CM | POA: Diagnosis present

## 2011-12-13 DIAGNOSIS — I219 Acute myocardial infarction, unspecified: Secondary | ICD-10-CM | POA: Diagnosis not present

## 2011-12-13 DIAGNOSIS — Z79899 Other long term (current) drug therapy: Secondary | ICD-10-CM

## 2011-12-13 DIAGNOSIS — R0789 Other chest pain: Secondary | ICD-10-CM | POA: Diagnosis not present

## 2011-12-13 DIAGNOSIS — D649 Anemia, unspecified: Secondary | ICD-10-CM | POA: Diagnosis present

## 2011-12-13 DIAGNOSIS — R079 Chest pain, unspecified: Secondary | ICD-10-CM

## 2011-12-13 DIAGNOSIS — I5021 Acute systolic (congestive) heart failure: Secondary | ICD-10-CM | POA: Diagnosis not present

## 2011-12-13 DIAGNOSIS — R5381 Other malaise: Secondary | ICD-10-CM | POA: Diagnosis not present

## 2011-12-13 DIAGNOSIS — I214 Non-ST elevation (NSTEMI) myocardial infarction: Secondary | ICD-10-CM | POA: Diagnosis not present

## 2011-12-13 DIAGNOSIS — Z85038 Personal history of other malignant neoplasm of large intestine: Secondary | ICD-10-CM | POA: Diagnosis not present

## 2011-12-13 DIAGNOSIS — Z8673 Personal history of transient ischemic attack (TIA), and cerebral infarction without residual deficits: Secondary | ICD-10-CM

## 2011-12-13 DIAGNOSIS — R609 Edema, unspecified: Secondary | ICD-10-CM

## 2011-12-13 DIAGNOSIS — Z09 Encounter for follow-up examination after completed treatment for conditions other than malignant neoplasm: Secondary | ICD-10-CM | POA: Diagnosis not present

## 2011-12-13 DIAGNOSIS — E876 Hypokalemia: Secondary | ICD-10-CM | POA: Diagnosis not present

## 2011-12-13 DIAGNOSIS — Z9911 Dependence on respirator [ventilator] status: Secondary | ICD-10-CM | POA: Diagnosis not present

## 2011-12-13 DIAGNOSIS — J811 Chronic pulmonary edema: Secondary | ICD-10-CM | POA: Diagnosis not present

## 2011-12-13 DIAGNOSIS — I119 Hypertensive heart disease without heart failure: Secondary | ICD-10-CM

## 2011-12-13 DIAGNOSIS — M7989 Other specified soft tissue disorders: Secondary | ICD-10-CM

## 2011-12-13 DIAGNOSIS — I501 Left ventricular failure: Secondary | ICD-10-CM

## 2011-12-13 DIAGNOSIS — I359 Nonrheumatic aortic valve disorder, unspecified: Secondary | ICD-10-CM | POA: Diagnosis present

## 2011-12-13 DIAGNOSIS — J90 Pleural effusion, not elsewhere classified: Secondary | ICD-10-CM | POA: Diagnosis not present

## 2011-12-13 DIAGNOSIS — N183 Chronic kidney disease, stage 3 unspecified: Secondary | ICD-10-CM | POA: Diagnosis present

## 2011-12-13 DIAGNOSIS — R0989 Other specified symptoms and signs involving the circulatory and respiratory systems: Secondary | ICD-10-CM | POA: Diagnosis not present

## 2011-12-13 DIAGNOSIS — I2510 Atherosclerotic heart disease of native coronary artery without angina pectoris: Secondary | ICD-10-CM | POA: Diagnosis present

## 2011-12-13 DIAGNOSIS — I130 Hypertensive heart and chronic kidney disease with heart failure and stage 1 through stage 4 chronic kidney disease, or unspecified chronic kidney disease: Secondary | ICD-10-CM | POA: Diagnosis present

## 2011-12-13 DIAGNOSIS — J9819 Other pulmonary collapse: Secondary | ICD-10-CM | POA: Diagnosis not present

## 2011-12-13 DIAGNOSIS — D631 Anemia in chronic kidney disease: Secondary | ICD-10-CM

## 2011-12-13 DIAGNOSIS — N179 Acute kidney failure, unspecified: Secondary | ICD-10-CM | POA: Diagnosis not present

## 2011-12-13 DIAGNOSIS — I509 Heart failure, unspecified: Secondary | ICD-10-CM | POA: Diagnosis present

## 2011-12-13 DIAGNOSIS — N189 Chronic kidney disease, unspecified: Secondary | ICD-10-CM | POA: Diagnosis not present

## 2011-12-13 DIAGNOSIS — R918 Other nonspecific abnormal finding of lung field: Secondary | ICD-10-CM | POA: Diagnosis not present

## 2011-12-13 DIAGNOSIS — J984 Other disorders of lung: Secondary | ICD-10-CM | POA: Diagnosis not present

## 2011-12-13 DIAGNOSIS — J810 Acute pulmonary edema: Secondary | ICD-10-CM

## 2011-12-13 DIAGNOSIS — I5023 Acute on chronic systolic (congestive) heart failure: Secondary | ICD-10-CM

## 2011-12-13 HISTORY — PX: LEFT HEART CATHETERIZATION WITH CORONARY ANGIOGRAM: SHX5451

## 2011-12-13 LAB — PROTIME-INR
INR: 1.14 (ref 0.00–1.49)
Prothrombin Time: 14.8 seconds (ref 11.6–15.2)

## 2011-12-13 LAB — POCT I-STAT, CHEM 8
BUN: 20 mg/dL (ref 6–23)
Creatinine, Ser: 1.7 mg/dL — ABNORMAL HIGH (ref 0.50–1.35)
Potassium: 4.2 mEq/L (ref 3.5–5.1)
Sodium: 140 mEq/L (ref 135–145)

## 2011-12-13 LAB — POCT I-STAT 3, ART BLOOD GAS (G3+)
Acid-base deficit: 3 mmol/L — ABNORMAL HIGH (ref 0.0–2.0)
Bicarbonate: 21.8 mEq/L (ref 20.0–24.0)
Bicarbonate: 21.9 mEq/L (ref 20.0–24.0)
O2 Saturation: 87 %
O2 Saturation: 94 %
TCO2: 23 mmol/L (ref 0–100)
TCO2: 23 mmol/L (ref 0–100)
pCO2 arterial: 32.8 mmHg — ABNORMAL LOW (ref 35.0–45.0)
pCO2 arterial: 48.2 mmHg — ABNORMAL HIGH (ref 35.0–45.0)
pCO2 arterial: 39.6 mmHg (ref 35.0–45.0)
pH, Arterial: 7.257 — ABNORMAL LOW (ref 7.350–7.450)
pO2, Arterial: 44 mmHg — ABNORMAL LOW (ref 80.0–100.0)
pO2, Arterial: 59 mmHg — ABNORMAL LOW (ref 80.0–100.0)
pO2, Arterial: 74 mmHg — ABNORMAL LOW (ref 80.0–100.0)

## 2011-12-13 LAB — COMPREHENSIVE METABOLIC PANEL
AST: 17 U/L (ref 0–37)
Albumin: 3.1 g/dL — ABNORMAL LOW (ref 3.5–5.2)
Alkaline Phosphatase: 57 U/L (ref 39–117)
BUN: 20 mg/dL (ref 6–23)
Creatinine, Ser: 1.79 mg/dL — ABNORMAL HIGH (ref 0.50–1.35)
Potassium: 4.1 mEq/L (ref 3.5–5.1)
Total Protein: 6 g/dL (ref 6.0–8.3)

## 2011-12-13 LAB — HEPARIN LEVEL (UNFRACTIONATED): Heparin Unfractionated: 0.23 IU/mL — ABNORMAL LOW (ref 0.30–0.70)

## 2011-12-13 LAB — CARDIAC PANEL(CRET KIN+CKTOT+MB+TROPI)
CK, MB: 26.6 ng/mL (ref 0.3–4.0)
Relative Index: 6 — ABNORMAL HIGH (ref 0.0–2.5)
Total CK: 449 U/L — ABNORMAL HIGH (ref 7–232)

## 2011-12-13 LAB — CBC
HCT: 34.5 % — ABNORMAL LOW (ref 39.0–52.0)
MCHC: 33.6 g/dL (ref 30.0–36.0)
Platelets: 124 10*3/uL — ABNORMAL LOW (ref 150–400)
RDW: 12.9 % (ref 11.5–15.5)

## 2011-12-13 LAB — POCT I-STAT TROPONIN I

## 2011-12-13 SURGERY — LEFT HEART CATHETERIZATION WITH CORONARY ANGIOGRAM
Anesthesia: LOCAL

## 2011-12-13 MED ORDER — MIDAZOLAM HCL 2 MG/2ML IJ SOLN
INTRAMUSCULAR | Status: AC
  Filled 2011-12-13: qty 2

## 2011-12-13 MED ORDER — SODIUM CHLORIDE 0.9 % IV SOLN: 250.0000 mL | INTRAVENOUS | Status: DC | PRN

## 2011-12-13 MED ORDER — LIDOCAINE HCL (CARDIAC) 20 MG/ML IV SOLN
INTRAVENOUS | Status: AC
  Filled 2011-12-13: qty 5

## 2011-12-13 MED ORDER — SODIUM CHLORIDE 0.9 % IJ SOLN
3.0000 mL | Freq: Two times a day (BID) | INTRAMUSCULAR | Status: DC
  Administered 2011-12-13 – 2011-12-19 (×10): 3 mL via INTRAVENOUS

## 2011-12-13 MED ORDER — SODIUM CHLORIDE 0.9 % IV SOLN
25.0000 ug/h | INTRAVENOUS | Status: DC
  Administered 2011-12-13: 25 ug/h via INTRAVENOUS
  Filled 2011-12-13 (×2): qty 50

## 2011-12-13 MED ORDER — ADULT MULTIVITAMIN W/MINERALS CH
1.0000 | ORAL_TABLET | Freq: Every day | ORAL | Status: DC
  Filled 2011-12-13: qty 1

## 2011-12-13 MED ORDER — NITROGLYCERIN 0.4 MG SL SUBL: 0.4000 mg | SUBLINGUAL_TABLET | SUBLINGUAL | Status: DC | PRN

## 2011-12-13 MED ORDER — FUROSEMIDE 10 MG/ML IJ SOLN
INTRAMUSCULAR | Status: AC
  Filled 2011-12-13: qty 4

## 2011-12-13 MED ORDER — SODIUM CHLORIDE 0.9 % IJ SOLN: 3.0000 mL | Freq: Two times a day (BID) | INTRAMUSCULAR | Status: DC

## 2011-12-13 MED ORDER — MIDAZOLAM HCL 2 MG/2ML IJ SOLN
1.0000 mg | INTRAMUSCULAR | Status: DC | PRN
  Administered 2011-12-13 (×2): 2 mg via INTRAVENOUS
  Filled 2011-12-13 (×3): qty 2

## 2011-12-13 MED ORDER — PANTOPRAZOLE SODIUM 40 MG IV SOLR
40.0000 mg | Freq: Every day | INTRAVENOUS | Status: DC
  Administered 2011-12-13 – 2011-12-15 (×3): 40 mg via INTRAVENOUS
  Filled 2011-12-13 (×4): qty 40

## 2011-12-13 MED ORDER — SIMVASTATIN 10 MG PO TABS
10.0000 mg | ORAL_TABLET | Freq: Every day | ORAL | Status: DC
  Filled 2011-12-13: qty 1

## 2011-12-13 MED ORDER — ASPIRIN 81 MG PO CHEW
324.0000 mg | CHEWABLE_TABLET | ORAL | Status: AC
  Administered 2011-12-13: 324 mg via ORAL
  Filled 2011-12-13: qty 4

## 2011-12-13 MED ORDER — MIDAZOLAM HCL 2 MG/2ML IJ SOLN
INTRAMUSCULAR | Status: AC
  Administered 2011-12-13: 4 mg
  Filled 2011-12-13: qty 4

## 2011-12-13 MED ORDER — ASPIRIN EC 81 MG PO TBEC: 81.0000 mg | DELAYED_RELEASE_TABLET | Freq: Every day | ORAL | Status: DC

## 2011-12-13 MED ORDER — SODIUM CHLORIDE 0.9 % IV SOLN
250.0000 mL | INTRAVENOUS | Status: DC | PRN
  Administered 2011-12-13: 250 mL via INTRAVENOUS

## 2011-12-13 MED ORDER — ROCURONIUM BROMIDE 50 MG/5ML IV SOLN
INTRAVENOUS | Status: AC
Start: 2011-12-13 — End: 2011-12-14
  Filled 2011-12-13: qty 2

## 2011-12-13 MED ORDER — HEPARIN (PORCINE) IN NACL 2-0.9 UNIT/ML-% IJ SOLN
INTRAMUSCULAR | Status: AC
  Filled 2011-12-13: qty 2000

## 2011-12-13 MED ORDER — SODIUM CHLORIDE 0.9 % IJ SOLN: 3.0000 mL | INTRAMUSCULAR | Status: DC | PRN

## 2011-12-13 MED ORDER — SIMVASTATIN 10 MG PO TABS
10.0000 mg | ORAL_TABLET | Freq: Every day | ORAL | Status: DC
  Administered 2011-12-13 – 2011-12-18 (×5): 10 mg
  Filled 2011-12-13 (×7): qty 1

## 2011-12-13 MED ORDER — LISINOPRIL 20 MG PO TABS
20.0000 mg | ORAL_TABLET | Freq: Every day | ORAL | Status: DC
  Filled 2011-12-13: qty 1

## 2011-12-13 MED ORDER — FENTANYL CITRATE 0.05 MG/ML IJ SOLN
25.0000 ug | INTRAMUSCULAR | Status: DC | PRN
  Administered 2011-12-13: 50 ug via INTRAVENOUS
  Administered 2011-12-13: 18:00:00 via INTRAVENOUS
  Filled 2011-12-13 (×3): qty 2

## 2011-12-13 MED ORDER — HEPARIN SOD (PORCINE) IN D5W 100 UNIT/ML IV SOLN
1000.0000 [IU]/h | INTRAVENOUS | Status: DC
  Administered 2011-12-13 – 2011-12-14 (×2): 1200 [IU]/h via INTRAVENOUS
  Administered 2011-12-14: 1000 [IU]/h via INTRAVENOUS
  Filled 2011-12-13 (×4): qty 250

## 2011-12-13 MED ORDER — FENTANYL CITRATE 0.05 MG/ML IJ SOLN
100.0000 ug | Freq: Once | INTRAMUSCULAR | Status: AC
  Administered 2011-12-13: 100 ug via INTRAVENOUS

## 2011-12-13 MED ORDER — HEPARIN SOD (PORCINE) IN D5W 100 UNIT/ML IV SOLN: 1200.0000 [IU]/h | INTRAVENOUS | Status: DC

## 2011-12-13 MED ORDER — CLOPIDOGREL BISULFATE 75 MG PO TABS
600.0000 mg | ORAL_TABLET | Freq: Once | ORAL | Status: DC
  Filled 2011-12-13: qty 8

## 2011-12-13 MED ORDER — ETOMIDATE 2 MG/ML IV SOLN
INTRAVENOUS | Status: AC
  Administered 2011-12-13: 20 mg
  Filled 2011-12-13: qty 20

## 2011-12-13 MED ORDER — METOPROLOL SUCCINATE ER 50 MG PO TB24
50.0000 mg | ORAL_TABLET | Freq: Every day | ORAL | Status: DC
  Administered 2011-12-13: 50 mg via ORAL
  Filled 2011-12-13: qty 1

## 2011-12-13 MED ORDER — TIMOLOL MALEATE 0.5 % OP SOLN
1.0000 [drp] | Freq: Two times a day (BID) | OPHTHALMIC | Status: DC
  Administered 2011-12-13 – 2011-12-23 (×20): 1 [drp] via OPHTHALMIC
  Filled 2011-12-13 (×3): qty 5

## 2011-12-13 MED ORDER — SODIUM CHLORIDE 0.9 % IJ SOLN
3.0000 mL | Freq: Two times a day (BID) | INTRAMUSCULAR | Status: DC
  Administered 2011-12-13 – 2011-12-22 (×15): 3 mL via INTRAVENOUS

## 2011-12-13 MED ORDER — LIDOCAINE HCL (PF) 1 % IJ SOLN
INTRAMUSCULAR | Status: AC
  Filled 2011-12-13: qty 30

## 2011-12-13 MED ORDER — SUCCINYLCHOLINE CHLORIDE 20 MG/ML IJ SOLN
INTRAMUSCULAR | Status: AC
  Filled 2011-12-13: qty 10

## 2011-12-13 MED ORDER — SODIUM CHLORIDE 0.9 % IV SOLN
INTRAVENOUS | Status: DC
  Administered 2011-12-13 – 2011-12-18 (×2): via INTRAVENOUS

## 2011-12-13 MED ORDER — NITROGLYCERIN IN D5W 200-5 MCG/ML-% IV SOLN
5.0000 ug/min | INTRAVENOUS | Status: DC
  Administered 2011-12-13 (×2): 5 ug/min via INTRAVENOUS
  Filled 2011-12-13: qty 250

## 2011-12-13 MED ORDER — ONDANSETRON HCL 4 MG/2ML IJ SOLN: 4.0000 mg | Freq: Four times a day (QID) | INTRAMUSCULAR | Status: DC | PRN

## 2011-12-13 MED ORDER — NITROGLYCERIN 0.2 MG/ML ON CALL CATH LAB
INTRAVENOUS | Status: AC
  Filled 2011-12-13: qty 1

## 2011-12-13 MED ORDER — DEXTROSE 5 % IV SOLN
8.0000 mg/h | INTRAVENOUS | Status: DC
  Administered 2011-12-13: 8 mg/h via INTRAVENOUS
  Filled 2011-12-13 (×5): qty 25

## 2011-12-13 MED ORDER — SODIUM CHLORIDE 0.9 % IV SOLN
1.0000 mg/h | INTRAVENOUS | Status: DC
  Administered 2011-12-13: 2 mg/h via INTRAVENOUS
  Administered 2011-12-14: 1 mg/h via INTRAVENOUS
  Filled 2011-12-13 (×2): qty 10

## 2011-12-13 MED ORDER — PANTOPRAZOLE SODIUM 40 MG PO TBEC
40.0000 mg | DELAYED_RELEASE_TABLET | Freq: Every day | ORAL | Status: DC
  Administered 2011-12-13: 40 mg via ORAL
  Filled 2011-12-13: qty 1

## 2011-12-13 MED ORDER — CLOPIDOGREL BISULFATE 75 MG PO TABS: 75.0000 mg | ORAL_TABLET | Freq: Every day | ORAL | Status: DC

## 2011-12-13 MED ORDER — FENTANYL CITRATE 0.05 MG/ML IJ SOLN
INTRAMUSCULAR | Status: AC
  Administered 2011-12-13: 100 ug
  Filled 2011-12-13: qty 4

## 2011-12-13 MED ORDER — ASPIRIN 300 MG RE SUPP: 300.0000 mg | RECTAL | Status: AC

## 2011-12-13 MED ORDER — ALBUTEROL SULFATE (5 MG/ML) 0.5% IN NEBU
2.5000 mg | INHALATION_SOLUTION | Freq: Four times a day (QID) | RESPIRATORY_TRACT | Status: DC
  Administered 2011-12-13 – 2011-12-18 (×19): 2.5 mg via RESPIRATORY_TRACT
  Filled 2011-12-13 (×19): qty 0.5

## 2011-12-13 MED ORDER — HEPARIN SODIUM (PORCINE) 1000 UNIT/ML IJ SOLN
INTRAMUSCULAR | Status: AC
  Filled 2011-12-13: qty 1

## 2011-12-13 MED ORDER — MIDAZOLAM HCL 2 MG/2ML IJ SOLN
2.0000 mg | Freq: Once | INTRAMUSCULAR | Status: DC
  Filled 2011-12-13: qty 2

## 2011-12-13 MED ORDER — ACETAMINOPHEN 325 MG PO TABS
650.0000 mg | ORAL_TABLET | ORAL | Status: DC | PRN
  Administered 2011-12-14: 650 mg via ORAL
  Filled 2011-12-13: qty 2

## 2011-12-13 MED ORDER — VITAMIN D3 25 MCG (1000 UNIT) PO TABS
2000.0000 [IU] | ORAL_TABLET | Freq: Every day | ORAL | Status: DC
  Filled 2011-12-13: qty 2

## 2011-12-13 MED ORDER — SODIUM CHLORIDE 0.9 % IV SOLN
250.0000 mL | INTRAVENOUS | Status: DC | PRN
  Administered 2011-12-13 (×2): 1000 mL via INTRAVENOUS

## 2011-12-13 MED ORDER — VERAPAMIL HCL 2.5 MG/ML IV SOLN
INTRAVENOUS | Status: AC
  Filled 2011-12-13: qty 2

## 2011-12-13 MED ORDER — HEPARIN (PORCINE) IN NACL 100-0.45 UNIT/ML-% IJ SOLN
1200.0000 [IU]/h | INTRAMUSCULAR | Status: DC
  Administered 2011-12-13: 1000 [IU]/h via INTRAVENOUS
  Administered 2011-12-13: 1200 [IU]/h via INTRAVENOUS
  Administered 2011-12-13: 1000 [IU]/h via INTRAVENOUS
  Filled 2011-12-13 (×2): qty 250

## 2011-12-13 NOTE — Interval H&P Note (Signed)
History and Physical Interval Note:  12/13/2011 2:30 PM  [name] [name]  has presented today for surgery, with the diagnosis of chest pain  The various methods of treatment have been discussed with the patient and family. After consideration of risks, benefits and other options for treatment, the patient has consented to  Procedure(s): LEFT HEART CATHETERIZATION WITH CORONARY ANGIOGRAM as a surgical intervention .  The patients' history has been reviewed, patient examined, no change in status, stable for surgery.  I have reviewed the patients' chart and labs.  Questions were answered to the patient's satisfaction.     Theron Arista [facility]

## 2011-12-13 NOTE — Op Note (Signed)
Cardiac Catheterization Procedure Note  Name: [name] [name] MRN: [id number] DOB: [birth year]  Procedure: Left Heart Cath, Selective Coronary Angiography  Indication: 78 year old white male who presents with a non-ST elevation myocardial infarction. He has a history of chronic renal insufficiency. Echocardiogram in the past demonstrated LVH with normal systolic function and mild to moderate aortic stenosis.   Procedural Details: The right wrist was prepped, draped, and anesthetized with 1% lidocaine. Using the modified Seldinger technique, a 5 French sheath was introduced into the right radial artery. 3 mg of verapamil was administered through the sheath, weight-based unfractionated heparin was administered intravenously. Standard Judkins catheters were used for selective coronary angiography . Catheter exchanges were performed over an exchange length guidewire. Procedure was complicated by development of acute pulmonary edema associated with sinus tachycardia, hypoxemia, and increased patient agitation. This was managed with increasing IV nitroglycerin, IV Lasix, and BiPAP therapy. Patient was transported to the intensive care unit following the procedure. A TR band was used for radial hemostasis at the completion of the procedure.   Procedural Findings: Hemodynamics: AO 148/82 with a mean of 111 mmHg LV 162 with an EDP of 42 mmHg  Coronary angiography: Coronary dominance: right  Left mainstem: Mild ostial disease of 20%.  Left anterior descending (LAD): 50% ostial disease. The mid LAD is diffusely diseased up to 60-70%. The first diagonal branch has 40% disease at the origin and in the mid vessel. The second diagonal branch has 70% disease.  Left circumflex (LCx): Occluded at the ostium. There is minimal left to left collateral.  Right coronary artery (RCA): This is a dominant vessel. There is an 80-90% ostial stenosis.  Left ventriculography: Not performed to avoid increased contrast  load.  Final Conclusions:   1. Severe 3 vessel obstructive coronary disease. 2. Severely elevated left ventricular filling pressures.  Recommendations:  Aggressive medical therapy for pulmonary edema. Patient will  be transferred to the intensive care unit for IV Lasix, nitroglycerin, and BiPAP therapy.  [name] [name] 12/13/2011, 3:12 PM

## 2011-12-13 NOTE — H&P (View-Only) (Signed)
  Patient Name: Chastin Riesgo      SUBJECTIVE:no further chest pain no history of CAD;   Has new onset edema but without sob Echo 2011 normal LV function and Mild AS  Past Medical History  Diagnosis Date  . HYPERTENSION, HEART UNCONTROLLED W/O ASSOC CHF 02/03/2009  . Stroke 10.2.01  . History of colon cancer   . Heart murmur     PHYSICAL EXAM Filed Vitals:   12/13/11 0330 12/13/11 0415 12/13/11 0457 12/13/11 0730  BP: 125/59 117/57 122/57 120/59  Pulse: 76 71 65 62  Temp:   99.2 F (37.3 C) 98.5 F (36.9 C)  TempSrc:   Oral Oral  Resp: 24 23 23 22   Height:    5\' 8"  (1.727 m)  Weight:    149 lb 14.6 oz (68 kg)  SpO2: 97% 96% 97% 98%    General appearance: alert, cooperative, appears stated age and no distress Neck: no carotid bruit, no JVD, thyroid not enlarged, symmetric, no tenderness/mass/nodules and carotids brisk Lungs: clear to auscultation bilaterally Heart: systolic murmur: systolic ejection 3/6, crescendo and decrescendo at 2nd right intercostal space; split s2 preserved Abdomen: soft, non-tender; bowel sounds normal; no masses,  no organomegaly Extremities: edema 2 Pulses: 2+ and symmetric Skin: Skin color, texture, turgor normal. No rashes or lesions Neurologic: Alert and oriented X 3, normal strength and tone. Normal symmetric reflexes. Normal coordination and gait  TELEMETRY: Reviewed telemetry pt in nsr ECG with ST depression:    Intake/Output Summary (Last 24 hours) at 12/13/11 0815 Last data filed at 12/13/11 0500  Gross per 24 hour  Intake   9.17 ml  Output      0 ml  Net   9.17 ml    LABS: Basic Metabolic Panel:  Lab 12/13/11 4098 12/13/11 0252  NA 140 137  K 4.2 4.1  CL 105 102  CO2 -- 26  GLUCOSE 182* 180*  BUN 20 20  CREATININE 1.70* 1.79*  CALCIUM -- 8.9  MG -- --  PHOS -- --   Cardiac Enzymes: No results found for this basename: CKTOTAL:3,CKMB:3,CKMBINDEX:3,TROPONINI:3 in the last 72 hours CBC:  Lab 12/13/11 0310 12/13/11  0252  WBC -- 6.5  NEUTROABS -- --  HGB 11.9* 11.6*  HCT 35.0* 34.5*  MCV -- 96.1  PLT -- 124*   PROTIME: No results found for this basename: LABPROT:3,INR:3 in the last 72 hours Liver Function Tests:  Basename 12/13/11 0252  AST 17  ALT 13  ALKPHOS 57  BILITOT 0.4  PROT 6.0  ALBUMIN 3.1*     ASSESSMENT AND PLAN: 78 yo male with typical chest pain and +tn POC and repeat pending.  With chronic renal insuff (Cr 1.8 02/2010) hydration and cors only seems appropriate,  Will have to be careful with fluid given edema.  AS can be reevaluated by echo but does not seem the culprit by exam   Patient Active Hospital Problem List: NSTEMI (non-ST elevated myocardial infarction) (12/13/2011)   Assessment: as above   Plan: ccontinue ASA, BB hep and NTG; already has received plavix Chronic renal insufficiency, stage III (moderate) (12/13/2011)   Assessment: hydration pre procedure   Plan:  Anemia (12/13/2011)   Assessment: lieky related to above but will check stool guiac Edema-hold off on diuretics until post cath give renal insuff   Plan:      Signed, Sherryl Manges MD  12/13/2011

## 2011-12-13 NOTE — Procedures (Signed)
Central Venous Catheter Insertion Procedure Note [name] [name] [id number] 01-Jan-1926  Procedure: Insertion of Central Venous Catheter Indications: Assessment of intravascular volume and Drug and/or fluid administration  Procedure Details Consent: Risks of procedure as well as the alternatives and risks of each were explained to the (patient/caregiver).  Consent for procedure obtained. Time Out: Verified patient identification, verified procedure, site/side was marked, verified correct patient position, special equipment/implants available, medications/allergies/relevent history reviewed, required imaging and test results available.  Performed  Maximum sterile technique was used including antiseptics, cap, gloves, gown, hand hygiene, mask and sheet. Skin prep: Chlorhexidine; local anesthetic administered A antimicrobial bonded/coated triple lumen catheter was placed in the right subclavian vein using the Seldinger technique.  Evaluation Blood flow good Complications: No apparent complications Patient did tolerate procedure well. Chest X-ray ordered to verify placement.  CXR: normal.  [name] [name] 12/13/2011, 4:56 PM

## 2011-12-13 NOTE — Progress Notes (Signed)
CTSP following cath  Repeat ABG 55/29/7.25 on 100% BiPAP HR 140 qwith ST dep  BP 170/110 Diffuse rales Have discussed with CCM and family  We will proceed with intubation and aggressive medical therapy with issues of revascularization and IABP support deferred.   CCT  Dr Delford Field to see

## 2011-12-13 NOTE — Progress Notes (Signed)
ANTICOAGULATION CONSULT NOTE - Follow Up  Pharmacy Consult for Heparin Indication: chest pain/ACS  No Known Allergies  Patient Measurements: Height: 5\' 8"  (172.7 cm) Weight: 152 lb 8.9 oz (69.2 kg) IBW/kg (Calculated) : 68.4   Vital Signs: Temp: 98.2 F (36.8 C) (01/02 2001) Temp src: Oral (01/02 2001) BP: 93/57 mmHg (01/02 1921) Pulse Rate: 73  (01/02 1921)  Labs:  Basename 12/13/11 1800 12/13/11 1133 12/13/11 0849 12/13/11 0745 12/13/11 0310 12/13/11 0252  HGB -- -- -- -- 11.9* 11.6*  HCT -- -- -- -- 35.0* 34.5*  PLT -- -- -- -- -- 124*  APTT -- -- -- -- -- --  LABPROT -- -- 14.8 -- -- --  INR -- -- 1.14 -- -- --  HEPARINUNFRC -- 0.23* -- -- -- --  CREATININE -- -- -- -- 1.70* 1.79*  CKTOTAL 1098* -- -- 449* -- --  CKMB 65.4* -- -- 26.6* -- --  TROPONINI >25.00* -- -- 13.96* -- --   Estimated Creatinine Clearance: 31.9 ml/min (by C-G formula based on Cr of 1.7).  Medical History: Past Medical History  Diagnosis Date  . HYPERTENSION, HEART UNCONTROLLED W/O ASSOC CHF 02/03/2009  . Stroke 10.2.01  . History of colon cancer   . Heart murmur   . Hypertension     Medications:  Scheduled:     . albuterol  2.5 mg Nebulization Q6H  . aspirin  324 mg Oral NOW   Or  . aspirin  300 mg Rectal NOW  . etomidate      . fentaNYL      . furosemide      . furosemide      . heparin      . heparin      . lidocaine (cardiac) 100 mg/37ml      . lidocaine      . midazolam      . midazolam      . midazolam      . nitroGLYCERIN      . pantoprazole (PROTONIX) IV  40 mg Intravenous Daily  . rocuronium      . simvastatin  10 mg Per Tube QHS  . sodium chloride  3 mL Intravenous Q12H  . sodium chloride  3 mL Intravenous Q12H  . succinylcholine      . timolol  1 drop Both Eyes BID  . verapamil      . DISCONTD: aspirin EC  81 mg Oral Daily  . DISCONTD: cholecalciferol  2,000 Units Oral Daily  . DISCONTD: clopidogrel  600 mg Oral Once  . DISCONTD: clopidogrel  75 mg Oral  Daily  . DISCONTD: lisinopril  20 mg Oral Daily  . DISCONTD: metoprolol  50 mg Oral Daily  . DISCONTD: mulitivitamin with minerals  1 tablet Oral Daily  . DISCONTD: pantoprazole  40 mg Oral Q1200  . DISCONTD: simvastatin  10 mg Oral QHS  . DISCONTD: sodium chloride  3 mL Intravenous Q12H    Assessment: 78 year old male presented with NSTEMI and is now post-cath with findings of multi-vessel disease as well as elevated left ventricular filling pressures.  He developed complications of pulmonary edema and was transferred to ICU.  The plan now is to resume anticoagulation 8 hours post sheath removal which was around 1530PM.    Goal of Therapy:  Heparin level 0.3-0.7 units/ml   Plan:  1) Restart IV heparin drip at 1200 units / hr 2) Obtain heparin level 8 hours after restart 3)  Monitor for  signs of bleeding   Nadara Mustard, PharmD., MS Clinical Pharmacist Pager:  [phone] 12/13/2011,8:27 PM

## 2011-12-13 NOTE — Procedures (Signed)
Intubation Procedure Note [name] [name] [id number] 10-Oct-1926  Procedure: Intubation Indications: Respiratory insufficiency  Procedure Details Consent: Risks of procedure as well as the alternatives and risks of each were explained to the (patient/caregiver).  Consent for procedure obtained. Time Out: Verified patient identification, verified procedure, site/side was marked, verified correct patient position, special equipment/implants available, medications/allergies/relevent history reviewed, required imaging and test results available.  Performed  Miller   Evaluation Hemodynamic Status: BP stable throughout; O2 sats: stable throughout Patient's Current Condition: unstable Complications: No apparent complications Patient did tolerate procedure well. Chest X-ray ordered to verify placement.  CXR: tube position acceptable.   [name] [name] 12/13/2011

## 2011-12-13 NOTE — Progress Notes (Signed)
  Patient Name: [name] [name]      SUBJECTIVE:no further chest pain no history of CAD;   Has new onset edema but without sob Echo 2011 normal LV function and Mild AS  Past Medical History  Diagnosis Date  . HYPERTENSION, HEART UNCONTROLLED W/O ASSOC CHF 02/03/2009  . Stroke 10.2.01  . History of colon cancer   . Heart murmur     PHYSICAL EXAM Filed Vitals:   12/13/11 0330 12/13/11 0415 12/13/11 0457 12/13/11 0730  BP: 125/59 117/57 122/57 120/59  Pulse: 76 71 65 62  Temp:   99.2 F (37.3 C) 98.5 F (36.9 C)  TempSrc:   Oral Oral  Resp: 24 23 23 22  Height:    5' 8" (1.727 m)  Weight:    149 lb 14.6 oz (68 kg)  SpO2: 97% 96% 97% 98%    General appearance: alert, cooperative, appears stated age and no distress Neck: no carotid bruit, no JVD, thyroid not enlarged, symmetric, no tenderness/mass/nodules and carotids brisk Lungs: clear to auscultation bilaterally Heart: systolic murmur: systolic ejection 3/6, crescendo and decrescendo at 2nd right intercostal space; split s2 preserved Abdomen: soft, non-tender; bowel sounds normal; no masses,  no organomegaly Extremities: edema 2 Pulses: 2+ and symmetric Skin: Skin color, texture, turgor normal. No rashes or lesions Neurologic: Alert and oriented X 3, normal strength and tone. Normal symmetric reflexes. Normal coordination and gait  TELEMETRY: Reviewed telemetry pt in nsr ECG with ST depression:    Intake/Output Summary (Last 24 hours) at 12/13/11 0815 Last data filed at 12/13/11 0500  Gross per 24 hour  Intake   9.17 ml  Output      0 ml  Net   9.17 ml    LABS: Basic Metabolic Panel:  Lab 12/13/11 0310 12/13/11 0252  NA 140 137  K 4.2 4.1  CL 105 102  CO2 -- 26  GLUCOSE 182* 180*  BUN 20 20  CREATININE 1.70* 1.79*  CALCIUM -- 8.9  MG -- --  PHOS -- --   Cardiac Enzymes: No results found for this basename: CKTOTAL:3,CKMB:3,CKMBINDEX:3,TROPONINI:3 in the last 72 hours CBC:  Lab 12/13/11 0310 12/13/11  0252  WBC -- 6.5  NEUTROABS -- --  HGB 11.9* 11.6*  HCT 35.0* 34.5*  MCV -- 96.1  PLT -- 124*   PROTIME: No results found for this basename: LABPROT:3,INR:3 in the last 72 hours Liver Function Tests:  Basename 12/13/11 0252  AST 17  ALT 13  ALKPHOS 57  BILITOT 0.4  PROT 6.0  ALBUMIN 3.1*     ASSESSMENT AND PLAN: 78 yo male with typical chest pain and +tn POC and repeat pending.  With chronic renal insuff (Cr 1.8 02/2010) hydration and cors only seems appropriate,  Will have to be careful with fluid given edema.  AS can be reevaluated by echo but does not seem the culprit by exam   Patient Active Hospital Problem List: NSTEMI (non-ST elevated myocardial infarction) (12/13/2011)   Assessment: as above   Plan: ccontinue ASA, BB hep and NTG; already has received plavix Chronic renal insufficiency, stage III (moderate) (12/13/2011)   Assessment: hydration pre procedure   Plan:  Anemia (12/13/2011)   Assessment: lieky related to above but will check stool guiac Edema-hold off on diuretics until post cath give renal insuff   Plan:      Signed, Steven Klein MD  12/13/2011   

## 2011-12-13 NOTE — ED Provider Notes (Signed)
History     CSN: [id number]  Arrival date & time 12/13/11  0229   First MD Initiated Contact with Patient 12/13/11 0248      Chief Complaint  Patient presents with  . Chest Pain    (Consider location/radiation/quality/duration/timing/severity/associated sxs/prior treatment) Patient is a 78 y.o. male presenting with chest pain. The history is provided by the patient.  Chest Pain The chest pain began 3 - 5 hours ago. Duration of episode(s) is 4 hours. Chest pain occurs constantly. The chest pain is improving. Associated with: nothing. At its most intense, the pain is at 6/10. The pain is currently at 1/10. The severity of the pain is moderate. The quality of the pain is described as pressure-like. The pain does not radiate. Exacerbated by: nothing. Primary symptoms include shortness of breath. Pertinent negatives for primary symptoms include no fever, no abdominal pain, no nausea and no vomiting.  Pertinent negatives for associated symptoms include no diaphoresis and no near-syncope. He tried aspirin for the symptoms.  His past medical history is significant for strokes.  Pertinent negatives for past medical history include no CAD.  Procedure history is negative for cardiac catheterization.    at home tonight around 10 PM began to develop left-sided chest pressure that worse around 11 p.m. and he took aspirin. Pain lasts about 4 hours until EMS arrived and gave him nitroglycerin. Pain-free on arrival to emergency department no history of similar pain. No history of MI or known heart disease. Moderate in severity no known aggravating factors. No recent illness. No fevers or chills. No leg pain or swelling. No weakness or numbness.  Past Medical History  Diagnosis Date  . HYPERTENSION, HEART UNCONTROLLED W/O ASSOC CHF 02/03/2009  . Stroke 10.2.01  . History of colon cancer   . Heart murmur     Past Surgical History  Procedure Date  . Colon resection 1996  . Esophageal dilation   .  Colonoscopy   . Cataract extraction     Family History  Problem Relation Age of Onset  . Cancer    . Heart attack    . Diabetes    . Heart failure    . Cancer Mother   . Heart attack Father     History  Substance Use Topics  . Smoking status: Never Smoker   . Smokeless tobacco: Not on file  . Alcohol Use: No      Review of Systems  Constitutional: Negative for fever, chills and diaphoresis.  HENT: Negative for neck pain and neck stiffness.   Eyes: Negative for pain.  Respiratory: Positive for shortness of breath.   Cardiovascular: Positive for chest pain. Negative for near-syncope.  Gastrointestinal: Negative for nausea, vomiting and abdominal pain.  Genitourinary: Negative for dysuria.  Musculoskeletal: Negative for back pain.  Skin: Negative for rash.  Neurological: Negative for headaches.  All other systems reviewed and are negative.    Allergies  Review of patient's allergies indicates no known allergies.  Home Medications   Current Outpatient Rx  Name Route Sig Dispense Refill  . CLOPIDOGREL BISULFATE 75 MG PO TABS Oral Take 75 mg by mouth daily.      Marland Kitchen LISINOPRIL 20 MG PO TABS Oral Take 20 mg by mouth daily.      Marland Kitchen METFORMIN HCL 500 MG PO TABS  2 tabs morning and 1 tab po qhs     . METIPRANOLOL 0.3 % OP SOLN Both Eyes Place 1 drop into both eyes daily.      Marland Kitchen  METOPROLOL SUCCINATE ER 50 MG PO TB24 Oral Take 50 mg by mouth daily.      . MULTIVITAMINS PO CAPS Oral Take 1 capsule by mouth daily.      Marland Kitchen OMEPRAZOLE 20 MG PO CPDR Oral Take 20 mg by mouth daily.      Marland Kitchen PRAZOSIN HCL 1 MG PO CAPS Oral Take 1 mg by mouth 2 (two) times daily.      Marland Kitchen SIMVASTATIN 10 MG PO TABS Oral Take 10 mg by mouth at bedtime.        BP 119/49  Pulse 75  Temp(Src) 99.8 F (37.7 C) (Oral)  Resp 18  Ht 5\' 8"  (1.727 m)  Wt 150 lb (68.04 kg)  BMI 22.81 kg/m2  SpO2 91%  Physical Exam  Constitutional: He is oriented to person, place, and time. He appears well-developed and  well-nourished.  HENT:  Head: Normocephalic and atraumatic.  Eyes: Conjunctivae and EOM are normal. Pupils are equal, round, and reactive to light.  Neck: Trachea normal. Neck supple. No thyromegaly present.  Cardiovascular: Normal rate, regular rhythm, S1 normal, S2 normal and normal pulses.   Murmur heard.  Systolic murmur is present with a grade of 4/6   No diastolic murmur is present  Pulses:      Radial pulses are 2+ on the right side, and 2+ on the left side.  Pulmonary/Chest: Effort normal and breath sounds normal. He has no wheezes. He has no rhonchi. He has no rales. He exhibits no tenderness.  Abdominal: Soft. Normal appearance and bowel sounds are normal. There is no tenderness. There is no CVA tenderness and negative Murphy's sign.  Musculoskeletal:       BLE:s Calves nontender, no cords or erythema, negative Homans sign  Neurological: He is alert and oriented to person, place, and time. He has normal strength. No cranial nerve deficit or sensory deficit. GCS eye subscore is 4. GCS verbal subscore is 5. GCS motor subscore is 6.  Skin: Skin is warm and dry. No rash noted. He is not diaphoretic.  Psychiatric: His speech is normal.       Cooperative and appropriate    ED Course  Procedures (including critical care time)  Labs Reviewed  POCT I-STAT, CHEM 8 - Abnormal; Notable for the following:    Creatinine, Ser 1.70 (*)    Glucose, Bld 182 (*)    Hemoglobin 11.9 (*)    HCT 35.0 (*)    All other components within normal limits  POCT I-STAT TROPONIN I - Abnormal; Notable for the following:    Troponin i, poc 0.31 (*)    All other components within normal limits  I-STAT TROPONIN I  I-STAT, CHEM 8  CBC  COMPREHENSIVE METABOLIC PANEL   Dg Chest Portable 1 View  12/13/2011  *RADIOLOGY REPORT*  Clinical Data: Left-sided chest pain and pressure  PORTABLE CHEST - 1 VIEW  Comparison: 02/17/2011  Findings: Shallow inspiration with elevation of the left hemidiaphragm similar to  previous study.  The heart size and pulmonary vascularity appear increased and there is new development of interstitial change suggesting pulmonary vascular congestion with mild interstitial edema.  No blunting of costophrenic angles. No pneumothorax.  No focal airspace consolidation.  Degenerative changes in the spine.  IMPRESSION: Increasing heart size and pulmonary vascular congestion with mild interstitial edema since previous study.  Original Report Authenticated By: Marlon Pel, M.D.   CRITICAL CARE Performed by: Sunnie Nielsen   Total critical care time: 45  Critical care time  was exclusive of separately billable procedures and treating other patients.  Critical care was necessary to treat or prevent imminent or life-threatening deterioration.  Critical care was time spent personally by me on the following activities: development of treatment plan with patient and/or surrogate as well as nursing, discussions with consultants, evaluation of patient's response to treatment, examination of patient, obtaining history from patient or surrogate, ordering and performing treatments and interventions, ordering and review of laboratory studies, ordering and review of radiographic studies, pulse oximetry and IV drips and serial evaluations, re-evaluation of patient's condition.   Date: 12/13/2011  Rate: 83  Rhythm: normal sinus rhythm  QRS Axis: normal  Intervals: normal  ST/T Wave abnormalities: ST depressions inferiorly and ST depressions laterally  Conduction Disutrbances:none  Narrative Interpretation:   Old EKG Reviewed: new INF ST depressions, STE aVR > V1  Patient developed return of mild chest pain and repeat EKG demonstrated no changes from above. Labs reviewed and heparin initiated and a nitroglycerin drip initiated. Cardiology consultation obtained and [place] cardiologist on call to see patient bedside at 3:45 AM.  MDM    non-ST elevation MI. Aspirin prior to arrival.  Nitroglycerin for pain. Heparin drip initiated. Cardiology consult for admit. Serial EKGs.        Sunnie Nielsen, MD 12/14/11 [phone]

## 2011-12-13 NOTE — Progress Notes (Signed)
CRITICAL VALUE ALERT  Critical value received: Troponin I 13.96 and MB 26.6  Date of notification:  12/13/2011 Time of notification:  0904  Critical value read back:yes  Nurse who received alert:  Victorino December RN  MD notified (1st page):  Theodore Demark PA  Time of first page:  0905  MD notified (2nd page):Dr. Marikay Alar  Time of second page: 0920  Responding MD:  Theodore Demark PA     Dr. Marikay Alar  Time MD responded: [phone]      [phone]

## 2011-12-13 NOTE — Progress Notes (Addendum)
ANTICOAGULATION CONSULT NOTE - Initial Consult  Pharmacy Consult for Heparin Indication: chest pain/ACS  No Known Allergies  Patient Measurements: Height: 5\' 8"  (172.7 cm) Weight: 149 lb 14.6 oz (68 kg) IBW/kg (Calculated) : 68.4   Vital Signs: Temp: 98.5 F (36.9 C) (01/02 0730) Temp src: Oral (01/02 0730) BP: 120/59 mmHg (01/02 0730) Pulse Rate: 62  (01/02 0730)  Labs:  Basename 12/13/11 0849 12/13/11 0745 12/13/11 0310 12/13/11 0252  HGB -- -- 11.9* 11.6*  HCT -- -- 35.0* 34.5*  PLT -- -- -- 124*  APTT -- -- -- --  LABPROT 14.8 -- -- --  INR 1.14 -- -- --  HEPARINUNFRC -- -- -- --  CREATININE -- -- 1.70* 1.79*  CKTOTAL -- 449* -- --  CKMB -- 26.6* -- --  TROPONINI -- 13.96* -- --   Estimated Creatinine Clearance: 31.7 ml/min (by C-G formula based on Cr of 1.7).  Medical History: Past Medical History  Diagnosis Date  . HYPERTENSION, HEART UNCONTROLLED W/O ASSOC CHF 02/03/2009  . Stroke 10.2.01  . History of colon cancer   . Heart murmur   . Hypertension     Medications:  Scheduled:    . aspirin  324 mg Oral NOW   Or  . aspirin  300 mg Rectal NOW  . aspirin EC  81 mg Oral Daily  . cholecalciferol  2,000 Units Oral Daily  . clopidogrel  600 mg Oral Once  . clopidogrel  75 mg Oral Daily  . metoprolol  50 mg Oral Daily  . mulitivitamin with minerals  1 tablet Oral Daily  . pantoprazole  40 mg Oral Q1200  . simvastatin  10 mg Oral QHS  . sodium chloride  3 mL Intravenous Q12H  . timolol  1 drop Both Eyes BID  . DISCONTD: lisinopril  20 mg Oral Daily    Assessment: 78 year old male presented this AM to ED with CP.  Heparin drip started at approximately 330 AM, ? Cath today  Goal of Therapy:  Heparin level 0.3-0.7 units/ml   Plan:  1) Continue heparin drip at 1000 units / hr 2) Obtain heparin level at 11:30 am if not to cath  Thank you  Elwin Sleight 12/13/2011,10:06 AM   Heparin level = 0.23 (goal = 0.3 to 0.7)  Plan: 1) Increase heparin  to 1200 units / hr  2) Follow up post cath  Thank you.  Okey Regal, PharmD

## 2011-12-13 NOTE — Progress Notes (Signed)
*  PRELIMINARY RESULTS*  Lower Extremity Venous has been performed. Bilateral:  No evidence of DVT or Baker's Cyst.     Farrel Demark RDMS 12/13/2011, 10:01 AM

## 2011-12-13 NOTE — ED Notes (Signed)
Pt is brought in by ems for chest pains. Pt states that the pain started at 1900 last night. #20 Left AC. Pt took 4 baby asa prior to ems arrival. ems gave one nitro. No relief with the nitro. Pt denies sob but says that he is getting winded.

## 2011-12-13 NOTE — H&P (Signed)
[name] [name] is an 78 y.o. male with mild to moderate aortic stenosis and LVH, HTN, DM and hyerlipidemia. Chief Complaint: Chest pain HPI: He was in is usual state of health until about bedtime when he started having left sided chest pain that was about 8/10 in intensity and pressure-like in quality . He denies nausea or vomiting, denies radiation, lightheadedness or SOB. Patient worsened over the course of 3 hrs and he had to have his wife call 911. He received 325mg  of Aspirin but only started to feel better after he received nitroglycerin. Pain resolved completely but recurred in the ER. His EKG revealed new st depression in leads V4-6 and point of care troponin was 0.31.  Past Medical History  Diagnosis Date  . HYPERTENSION, HEART UNCONTROLLED W/O ASSOC CHF 02/03/2009  . Stroke 10.2.01  . History of colon cancer   . Heart murmur     Past Surgical History  Procedure Date  . Colon resection 1996  . Esophageal dilation   . Colonoscopy   . Cataract extraction     Family History  Problem Relation Age of Onset  . Cancer    . Heart attack    . Diabetes    . Heart failure    . Cancer Mother   . Heart attack Father    Social History:  reports that he has never smoked. He does not have any smokeless tobacco history on file. He reports that he does not drink alcohol or use illicit drugs.  Allergies: No Known Allergies  Medications Prior to Admission  Medication Dose Route Frequency Provider Last Rate Last Dose  . heparin ADULT infusion 100 units/mL (25000 units/250 mL)  1,000 Units/hr Intravenous Continuous Sunnie Nielsen, MD 10 mL/hr at 12/13/11 0417 1,000 Units/hr at 12/13/11 0417  . nitroGLYCERIN 0.2 mg/mL in dextrose 5 % infusion  5 mcg/min Intravenous Titrated Sunnie Nielsen, MD 1.5 mL/hr at 12/13/11 0340 5 mcg/min at 12/13/11 0340   Medications Prior to Admission  Medication Sig Dispense Refill  . clopidogrel (PLAVIX) 75 MG tablet Take 75 mg by mouth daily.        Marland Kitchen lisinopril  (PRINIVIL,ZESTRIL) 20 MG tablet Take 20 mg by mouth daily.        . metFORMIN (GLUCOPHAGE) 500 MG tablet 2 tabs morning and 1 tab po qhs       . metipranolol (OPTIPRANOLOL) 0.3 % ophthalmic solution Place 1 drop into both eyes daily.        . metoprolol (TOPROL-XL) 50 MG 24 hr tablet Take 50 mg by mouth daily.        . Multiple Vitamin (MULTIVITAMIN) capsule Take 1 capsule by mouth daily.        Marland Kitchen omeprazole (PRILOSEC) 20 MG capsule Take 20 mg by mouth daily.        . prazosin (MINIPRESS) 1 MG capsule Take 1 mg by mouth 2 (two) times daily.        . simvastatin (ZOCOR) 10 MG tablet Take 10 mg by mouth at bedtime.          Results for orders placed during the hospital encounter of 12/13/11 (from the past 48 hour(s))  CBC     Status: Abnormal   Collection Time   12/13/11  2:52 AM      Component Value Range Comment   WBC 6.5  4.0 - 10.5 (K/uL)    RBC 3.59 (*) 4.22 - 5.81 (MIL/uL)    Hemoglobin 11.6 (*) 13.0 - 17.0 (g/dL)  HCT 34.5 (*) 39.0 - 52.0 (%)    MCV 96.1  78.0 - 100.0 (fL)    MCH 32.3  26.0 - 34.0 (pg)    MCHC 33.6  30.0 - 36.0 (g/dL)    RDW 09.8  11.9 - 14.7 (%)    Platelets 124 (*) 150 - 400 (K/uL)   COMPREHENSIVE METABOLIC PANEL     Status: Abnormal   Collection Time   12/13/11  2:52 AM      Component Value Range Comment   Sodium 137  135 - 145 (mEq/L)    Potassium 4.1  3.5 - 5.1 (mEq/L)    Chloride 102  96 - 112 (mEq/L)    CO2 26  19 - 32 (mEq/L)    Glucose, Bld 180 (*) 70 - 99 (mg/dL)    BUN 20  6 - 23 (mg/dL)    Creatinine, Ser 8.29 (*) 0.50 - 1.35 (mg/dL)    Calcium 8.9  8.4 - 10.5 (mg/dL)    Total Protein 6.0  6.0 - 8.3 (g/dL)    Albumin 3.1 (*) 3.5 - 5.2 (g/dL)    AST 17  0 - 37 (U/L)    ALT 13  0 - 53 (U/L)    Alkaline Phosphatase 57  39 - 117 (U/L)    Total Bilirubin 0.4  0.3 - 1.2 (mg/dL)    GFR calc non Af Amer 33 (*) >90 (mL/min)    GFR calc Af Amer 39 (*) >90 (mL/min)   POCT I-STAT TROPONIN I     Status: Abnormal   Collection Time   12/13/11  3:08 AM       Component Value Range Comment   Troponin i, poc 0.31 (*) 0.00 - 0.08 (ng/mL)    Comment NOTIFIED PHYSICIAN      Comment 3            POCT I-STAT, CHEM 8     Status: Abnormal   Collection Time   12/13/11  3:10 AM      Component Value Range Comment   Sodium 140  135 - 145 (mEq/L)    Potassium 4.2  3.5 - 5.1 (mEq/L)    Chloride 105  96 - 112 (mEq/L)    BUN 20  6 - 23 (mg/dL)    Creatinine, Ser 5.62 (*) 0.50 - 1.35 (mg/dL)    Glucose, Bld 130 (*) 70 - 99 (mg/dL)    Calcium, Ion 8.65  1.12 - 1.32 (mmol/L)    TCO2 25  0 - 100 (mmol/L)    Hemoglobin 11.9 (*) 13.0 - 17.0 (g/dL)    HCT 78.4 (*) 69.6 - 52.0 (%)    Dg Chest Portable 1 View  12/13/2011  *RADIOLOGY REPORT*  Clinical Data: Left-sided chest pain and pressure  PORTABLE CHEST - 1 VIEW  Comparison: 02/17/2011  Findings: Shallow inspiration with elevation of the left hemidiaphragm similar to previous study.  The heart size and pulmonary vascularity appear increased and there is new development of interstitial change suggesting pulmonary vascular congestion with mild interstitial edema.  No blunting of costophrenic angles. No pneumothorax.  No focal airspace consolidation.  Degenerative changes in the spine.  IMPRESSION: Increasing heart size and pulmonary vascular congestion with mild interstitial edema since previous study.  Original Report Authenticated By: Marlon Pel, M.D.    Review of Systems  Constitutional: Negative.   HENT: Negative.   Eyes: Negative.   Respiratory: Negative.   Gastrointestinal: Negative.   Genitourinary: Negative.   Musculoskeletal: Negative.  Skin: Negative.   Neurological: Negative.   Endo/Heme/Allergies: Negative.   Psychiatric/Behavioral: Negative.     Blood pressure 122/57, pulse 65, temperature 99.2 F (37.3 C), temperature source Oral, resp. rate 23, height 5\' 8"  (1.727 m), weight 150 lb (68.04 kg), SpO2 97.00%. Physical Exam  Constitutional: He is oriented to person, place, and time. No  distress.  HENT:  Head: Normocephalic.  Mouth/Throat: Oropharynx is clear and moist.  Eyes: Conjunctivae are normal. Pupils are equal, round, and reactive to light. No scleral icterus.  Neck: Normal range of motion. Neck supple. No JVD present.  Cardiovascular: Normal rate, regular rhythm, S1 normal and S2 normal.  PMI is not displaced.  Exam reveals gallop, S3 and decreased pulses. Exam reveals no S4 and no friction rub.   Murmur heard.  Crescendo decrescendo systolic murmur is present with a grade of 4/6  Respiratory: Effort normal and breath sounds normal. No respiratory distress. He has no wheezes. He has no rales. He exhibits no tenderness.  GI: Soft. Bowel sounds are normal. There is no tenderness.  Genitourinary:       Deferred  Musculoskeletal:       Right lower leg: He exhibits swelling and edema.       Legs: Lymphadenopathy:    He has no cervical adenopathy.  Neurological: He is alert and oriented to person, place, and time.  Skin: Skin is warm and dry. No pallor.  Psychiatric: He has a normal mood and affect. His speech is normal and behavior is normal. Judgment and thought content normal. Cognition and memory are normal.     Assessment/Plan NSTEMI  78 yr old man with mild to moderate aortic stenosis and multiple risk factors for CAD presents with typical chest pain, new ST depression and initial point of care troponin of 0.31  PLAN Proceed directly to cardiac catheterization C/w ASA  Plavix 600mg  stat then 75mg  daily Heparin drip Nitro drip ACEI,BB,Statin D/C metformin 2D echo  Hydrate gently prior to cath (renal insufficiency).  Grandville Silos 12/13/2011, 5:18 AM

## 2011-12-13 NOTE — ED Notes (Signed)
Troponin 0.31 ng/mL reported to Dr. Theodoro Kalata by B. Bing Plume, EMT

## 2011-12-13 NOTE — Consult Note (Signed)
Name: [name] [name] MRN: [id number] DOB: 1926/10/07    LOS: 0  PCCM  CONSULT NOTE  History of Present Illness: Elderly M with CAD s/p cath attempt this am with subsequent acute pulm edema.  PCCM asked to help with care . Pt intubated and CVL placed.  Pt with STEMI on admit  Lines / Drains: ETT 12/12/10>>> CVL R [place] 12/12/10>>>   Cultures: none  Antibiotics: none  Tests / Events: 1/2>>cath attempt no PCI done  The patient is sedated, intubated and unable to provide history, which was obtained for available medical records.    Past Medical History  Diagnosis Date  . HYPERTENSION, HEART UNCONTROLLED W/O ASSOC CHF 02/03/2009  . Stroke 10.2.01  . History of colon cancer   . Heart murmur   . Hypertension    Past Surgical History  Procedure Date  . Colon resection 1996  . Esophageal dilation   . Colonoscopy   . Cataract extraction    Prior to Admission medications   Medication Sig Start Date End Date Taking? Authorizing Provider  cholecalciferol (VITAMIN D) 1000 UNITS tablet Take 2,000 Units by mouth daily.     Yes Historical Provider, MD  clopidogrel (PLAVIX) 75 MG tablet Take 75 mg by mouth daily.     Yes Historical Provider, MD  lisinopril (PRINIVIL,ZESTRIL) 20 MG tablet Take 20 mg by mouth daily.     Yes Historical Provider, MD  metFORMIN (GLUCOPHAGE) 500 MG tablet 2 tabs morning and 1 tab po qhs    Yes Historical Provider, MD  metipranolol (OPTIPRANOLOL) 0.3 % ophthalmic solution Place 1 drop into both eyes daily.     Yes Historical Provider, MD  metoprolol (TOPROL-XL) 50 MG 24 hr tablet Take 50 mg by mouth daily.     Yes Historical Provider, MD  Multiple Vitamin (MULTIVITAMIN) capsule Take 1 capsule by mouth daily.     Yes Historical Provider, MD  omeprazole (PRILOSEC) 20 MG capsule Take 20 mg by mouth daily.     Yes Historical Provider, MD  prazosin (MINIPRESS) 1 MG capsule Take 1 mg by mouth 2 (two) times daily.     Yes Historical Provider, MD  simvastatin (ZOCOR) 10 MG  tablet Take 10 mg by mouth at bedtime.     Yes Historical Provider, MD   Allergies No Known Allergies  Family History Family History  Problem Relation Age of Onset  . Cancer    . Heart attack    . Diabetes    . Heart failure    . Cancer Mother   . Heart attack Father     Social History  reports that he has never smoked. He has never used smokeless tobacco. He reports that he does not drink alcohol or use illicit drugs.  Review Of Systems  11 points review of systems is negative with an exception of listed in HPI.  Vital Signs: Temp:  [98 F (36.7 C)-99.8 F (37.7 C)] 98 F (36.7 C) (01/02 1340) Pulse Rate:  [62-141] 106  (01/02 1627) Resp:  [13-39] 13  (01/02 1627) BP: (65-161)/(46-123) 65/46 mmHg (01/02 1627) SpO2:  [0 %-99 %] 90 % (01/02 1627) FiO2 (%):  [88 %-100 %] 100 % (01/02 1627) Weight:  [68 kg (149 lb 14.6 oz)-68.04 kg (150 lb)] 149 lb 14.6 oz (68 kg) (01/02 0730) I/O last 3 completed shifts: In: 9.2 [I.V.:9.2] Out: -   Physical Examination: General:  Elderly M on vent Neuro:  Awake and alert prior to intubation   HEENT:  JVD  noted.  orophyx clear Neck:  supple   Cardiovascular:  RRR nl s1 s2 no s3/s4  Lungs:  Rales, poor airflow Abdomen:  Soft NT BSA Musculoskeletal:  FROM Skin:  Clear   Ventilator settings: Vent Mode:  [-] PRVC FiO2 (%):  [88 %-100 %] 100 % Set Rate:  [12 bmp] 12 bmp Vt Set:  [500 mL] 500 mL PEEP:  [5 cmH20] 5 cmH20 Plateau Pressure:  [26 cmH20] 26 cmH20  Labs and Imaging:  Reviewed.  Please refer to the Assessment and Plan section for relevant results. Dg Chest Portable 1 View  12/13/2011  *RADIOLOGY REPORT*  Clinical Data: Left-sided chest pain and pressure  PORTABLE CHEST - 1 VIEW  Comparison: 02/17/2011  Findings: Shallow inspiration with elevation of the left hemidiaphragm similar to previous study.  The heart size and pulmonary vascularity appear increased and there is new development of interstitial change suggesting  pulmonary vascular congestion with mild interstitial edema.  No blunting of costophrenic angles. No pneumothorax.  No focal airspace consolidation.  Degenerative changes in the spine.  IMPRESSION: Increasing heart size and pulmonary vascular congestion with mild interstitial edema since previous study.  Original Report Authenticated By: Marlon Pel, M.D.    Assessment and Plan: 1. Acute pulmonary edema and acute resp failure d/t CAD and STEMI Plan Full vent supp IV lasix drip BD neb meds Per cardiology  2. CKD    Lab 12/13/11 0310 12/13/11 0252  CREATININE 1.70* 1.79*    Monitor  3. Anemia  Lab 12/13/11 0310 12/13/11 0252  HGB 11.9* 11.6*  HCT 35.0* 34.5*  WBC -- 6.5  PLT -- 124*   Monitor  4. HTN, hold BP meds for now   Best practices / Disposition: -->ICU status under PCCM -->full code -->SCD  for DVT Px -->Protonix for GI Px -->ventilator bundle -->diet  NPO -->family updated at bedside  The patient is critically ill with multiple organ systems failure and requires high complexity decision making for assessment and support, frequent evaluation and titration of therapies, application of advanced monitoring technologies and extensive interpretation of multiple databases. Critical Care Time devoted to patient care services described in this note is 45  minutes.  [name]  M.D. Pulmonary and Critical Care Medicine [facility] Cell: [phone]   Pager: [phone] 12/13/2011, 4:51 PM

## 2011-12-13 NOTE — Progress Notes (Signed)
Late entry- 1545- Pt received from cath lab. Pt currently on Bipap and having labored breathing-RR 30-50s with pink, frothy sputum. Pt extremely anxious and grabbing at tubing. Pt reassured and MD at bedside. ABG drawn. 1550-Results of ABG called to elink and Dr Graciela Husbands. Dr Graciela Husbands at bedside to speak with family. Wife states pt would like short term intubation but does not want long term ventilation per pt's previous request. Dr Delford Field, PCCM, at bedside for intubation. 1615-Pt intubated with sats 85%. Dr Delford Field aware of sats and will continue to monitor.  1645-Central line placed by Dr Delford Field, without difficulty, first attempt, using sterile technique. 1655-Portable chest xray done and Dr Delford Field reviewed- states ETT placement appropriate and central line okay to use. 1700-Sats continue in the 80's. Dr Delford Field aware. Will continue to monitor. 1745-Family at bedside, with pastor. All informed of patient's POC and verbalize understanding. Pt alert at times and nods head appropriately. 1805-Wife given pt's dentures and partials. She states she will carry them home and will bring them back when appropriate for pt.

## 2011-12-13 NOTE — Progress Notes (Signed)
  Echocardiogram 2D Echocardiogram has been performed.  Berda Shelvin Lynn Tarus Briski, RDCS 12/13/2011, 10:12 AM

## 2011-12-13 NOTE — ED Notes (Signed)
Family at bedside. 

## 2011-12-14 ENCOUNTER — Inpatient Hospital Stay (HOSPITAL_COMMUNITY): Payer: Medicare Other

## 2011-12-14 DIAGNOSIS — I501 Left ventricular failure: Secondary | ICD-10-CM

## 2011-12-14 DIAGNOSIS — I5021 Acute systolic (congestive) heart failure: Secondary | ICD-10-CM

## 2011-12-14 DIAGNOSIS — J9600 Acute respiratory failure, unspecified whether with hypoxia or hypercapnia: Secondary | ICD-10-CM

## 2011-12-14 DIAGNOSIS — J810 Acute pulmonary edema: Secondary | ICD-10-CM

## 2011-12-14 DIAGNOSIS — I214 Non-ST elevation (NSTEMI) myocardial infarction: Secondary | ICD-10-CM

## 2011-12-14 DIAGNOSIS — I5023 Acute on chronic systolic (congestive) heart failure: Secondary | ICD-10-CM

## 2011-12-14 LAB — BLOOD GAS, ARTERIAL
Acid-base deficit: 0.2 mmol/L (ref 0.0–2.0)
Drawn by: 31101
FIO2: 1 %
MECHVT: 500 mL
O2 Saturation: 100 %
RATE: 12 resp/min
TCO2: 25.2 mmol/L (ref 0–100)
pCO2 arterial: 39.1 mmHg (ref 35.0–45.0)

## 2011-12-14 LAB — BASIC METABOLIC PANEL
BUN: 31 mg/dL — ABNORMAL HIGH (ref 6–23)
CO2: 26 mEq/L (ref 19–32)
Chloride: 102 mEq/L (ref 96–112)
Glucose, Bld: 191 mg/dL — ABNORMAL HIGH (ref 70–99)
Potassium: 4.8 mEq/L (ref 3.5–5.1)
Sodium: 138 mEq/L (ref 135–145)

## 2011-12-14 LAB — HEPARIN LEVEL (UNFRACTIONATED): Heparin Unfractionated: 0.61 IU/mL (ref 0.30–0.70)

## 2011-12-14 LAB — LIPID PANEL
Cholesterol: 124 mg/dL (ref 0–200)
HDL: 47 mg/dL (ref >39)
Total CHOL/HDL Ratio: 2.6 RATIO
Triglycerides: 69 mg/dL (ref <150)

## 2011-12-14 LAB — GLUCOSE, CAPILLARY
Glucose-Capillary: 158 mg/dL — ABNORMAL HIGH (ref 70–99)
Glucose-Capillary: 173 mg/dL — ABNORMAL HIGH (ref 70–99)

## 2011-12-14 LAB — CBC
HCT: 41.6 % (ref 39.0–52.0)
Hemoglobin: 13.9 g/dL (ref 13.0–17.0)
RBC: 4.3 MIL/uL (ref 4.22–5.81)
WBC: 11 10*3/uL — ABNORMAL HIGH (ref 4.0–10.5)

## 2011-12-14 LAB — PROTIME-INR: INR: 1.24 (ref 0.00–1.49)

## 2011-12-14 MED ORDER — ASPIRIN 81 MG PO CHEW
324.0000 mg | CHEWABLE_TABLET | ORAL | Status: AC
  Administered 2011-12-14: 324 mg via ORAL
  Filled 2011-12-14: qty 4

## 2011-12-14 MED ORDER — ASPIRIN 300 MG RE SUPP
300.0000 mg | RECTAL | Status: AC
  Filled 2011-12-14: qty 1

## 2011-12-14 MED ORDER — CHLORHEXIDINE GLUCONATE 0.12 % MT SOLN
OROMUCOSAL | Status: AC
  Administered 2011-12-14: 15 mL
  Filled 2011-12-14: qty 15

## 2011-12-14 NOTE — Progress Notes (Signed)
Midnight CBG 181. E-Link MD notified.  No orders received.

## 2011-12-14 NOTE — Progress Notes (Signed)
Patient Name: [name] [name]      SUBJECTIVE:responsive to voice and denies ch pain or sob--intuibated  Past Medical History  Diagnosis Date  . HYPERTENSION, HEART UNCONTROLLED W/O ASSOC CHF 02/03/2009  . Stroke 10.2.01  . History of colon cancer   . Heart murmur   . Hypertension     PHYSICAL EXAM Filed Vitals:   12/14/11 0700 12/14/11 0711 12/14/11 0800 12/14/11 0925  BP: 116/70  119/60   Pulse: 65 65 65 66  Temp:   99.3 F (37.4 C)   TempSrc:   Oral   Resp: 15 14 14 16   Height:      Weight:      SpO2: 97% 97% 97% 95%    General appearance: intubated Neck: no JVD Lungs: clear to auscultation bilaterally and anteriorly Heart: regular rate and rhythm, S1, S2 normal, no murmur, click, rub or gallop Extremities: edema trace Skin: Skin color, texture, turgor normal. No rashes or lesions Neurologic: Grossly normal   TELEMETRY: Reviewed telemetry pt in nsr :    Intake/Output Summary (Last 24 hours) at 12/14/11 1105 Last data filed at 12/14/11 0800  Gross per 24 hour  Intake  517.5 ml  Output    891 ml  Net -373.5 ml    LABS: Basic Metabolic Panel:  Lab 12/14/11 0347 12/13/11 0310 12/13/11 0252  NA 138 140 137  K 4.8 4.2 4.1  CL 102 105 102  CO2 26 -- 26  GLUCOSE 191* 182* 180*  BUN 31* 20 20  CREATININE 2.33* 1.70* 1.79*  CALCIUM 8.7 -- 8.9  MG -- -- --  PHOS -- -- --   Cardiac Enzymes:  Basename 12/13/11 1800 12/13/11 0745  CKTOTAL 1098* 449*  CKMB 65.4* 26.6*  CKMBINDEX -- --  TROPONINI >25.00* 13.96*   CBC:  Lab 12/14/11 0400 12/13/11 0310 12/13/11 0252  WBC 11.0* -- 6.5  NEUTROABS -- -- --  HGB 13.9 11.9* 11.6*  HCT 41.6 35.0* 34.5*  MCV 96.7 -- 96.1  PLT 158 -- 124*   PROTIME:  Basename 12/14/11 0400 12/13/11 0849  LABPROT 15.9* 14.8  INR 1.24 1.14   Liver Function Tests:  Basename 12/13/11 0252  AST 17  ALT 13  ALKPHOS 57  BILITOT 0.4  PROT 6.0  ALBUMIN 3.1*   No results found for this basename: LIPASE:2,AMYLASE:2  in the last 72 hours BNP: No components found with this basename: POCBNP:3 D-Dimer: No results found for this basename: DDIMER:2 in the last 72 hours Hemoglobin A1C: No results found for this basename: HGBA1C in the last 72 hours Fasting Lipid Panel:  Basename 12/14/11 0400  CHOL 124  HDL 47  LDLCALC 63  TRIG 69  CHOLHDL 2.6  LDLDIRECT --   Thyroid Function Tests: No results found for this basename: TSH,T4TOTAL,FREET3,T3FREE,THYROIDAB in the last 72 hours Anemia Panel: No results found for this basename: VITAMINB12,FOLATE,FERRITIN,TIBC,IRON,RETICCTPCT in the last 72 hours  ECHO Left ventricle: The cavity size was normal. Wall thickness was normal. Systolic function was severely reduced. The estimated ejection fraction was in the range of 25% to 30%. Diffuse hypokinesis. There is akinesis of the posterolateral myocardium. Features are consistent with a pseudonormal left ventricular filling pattern, with concomitant abnormal relaxation and increased filling pressure (grade 2 diastolic dysfunction). - Aortic valve: Valve mobility was restricted. There was moderate to severe stenosis. Valve area: 1.03cm^2(VTI). Valve area: 1.11cm^2 (Vmax). - Mitral valve: Calcified annulus. Mild to moderate regurgitation. - Left atrium: The atrium was moderately dilated  ASSESSMENT AND PLAN:  Status post nonSTEMI complicated by acute pulmonary edema Echo reports aortic stenosis as well as mitral regurgitation neither of which is audible on examination. He is gradually improving from a pulmonary point of view and leak in further weAn  over the next 24 hours. We will need to review revascularization options with Dr. [place].  Renal insufficiency has also raised his head, not unexpectedly given hiis  day yesterday. Carefully he has reached his [place] and will begin to improve his renal function.  We will utilize antiplatelet therapy; anticipate initiation of beta blocker therapy within the  next few days and ACE inhibitors if his creatinine tolerates and hydralazine nitrates if they do not.  I reviewed with the family that the course is still treacherous.    Patient Active Hospital Problem List: NSTEMI (non-ST elevated myocardial infarction) (12/13/2011)   Acute systolic heart failure (12/14/2011)   Acute on chronic renal failure (12/13/2011)     REspiratory failure    Signed, Sherryl Manges MD  12/14/2011

## 2011-12-14 NOTE — Consult Note (Signed)
Name: [name] [name] MRN: [id number] DOB: 17-May-1926    LOS: 1  PCCM  CONSULT NOTE  History of Present Illness: Elderly M with CAD s/p cath attempt this am with subsequent acute pulm edema.  PCCM asked to help with care . Pt intubated and CVL placed.  Pt with STEMI on admit  Lines / Drains: ETT 12/12/10>>> CVL R [place] 12/12/10>>>   Cultures: None Recent Results (from the past 240 hour(s))  MRSA PCR SCREENING     Status: Normal   Collection Time   12/13/11  8:49 AM      Component Value Range Status Comment   MRSA by PCR NEGATIVE  NEGATIVE  Final      Antibiotics: Anti-infectives    None      Tests / Events: 1/2>>cath attempt no PCI done   INTERVAL HX and OVERNIGHT:  On 90% fio2 despite lasix gtt  Deeply sedated - RASS -3. WUA in progress. Dtr and wife at bedside - in tears   Vital Signs: Temp:  [97.2 F (36.2 C)-100.3 F (37.9 C)] 99.3 F (37.4 C) (01/03 0800) Pulse Rate:  [63-141] 66  (01/03 0925) Resp:  [12-39] 16  (01/03 0925) BP: (65-195)/(45-123) 119/60 mmHg (01/03 0800) SpO2:  [84 %-99 %] 95 % (01/03 0925) FiO2 (%):  [70 %-100 %] 70 % (01/03 0925) Weight:  [66.9 kg (147 lb 7.8 oz)-69.2 kg (152 lb 8.9 oz)] 147 lb 7.8 oz (66.9 kg) (01/03 0500) I/O last 3 completed shifts: In: 487.7 [I.V.:457.7; NG/GT:20; IV Piggyback:10] Out: 726 [Urine:726]  Physical Examination: General:  Elderly M on vent Neuro:  RASS -3 on sedation gtt HEENT:  JVD noted.  orophyx clear Neck:  supple   Cardiovascular:  RRR nl s1 s2 no s3/s4  Lungs:  Rales,  Abdomen:  Soft NT BSA Musculoskeletal:  FROM Skin:  Clear   Ventilator settings: Vent Mode:  [-] PRVC FiO2 (%):  [70 %-100 %] 70 % Set Rate:  [12 bmp] 12 bmp Vt Set:  [500 mL] 500 mL PEEP:  [5 cmH20-7 cmH20] 7 cmH20 Plateau Pressure:  [15 cmH20-26 cmH20] 16 cmH20  Labs and Imaging:  Portable Chest Xray In Am  12/14/2011  *RADIOLOGY REPORT*  Clinical Data: Endotracheal tube.  Short of breath.  PORTABLE CHEST - 1 VIEW   Comparison: 12/13/2011.  Findings: Endotracheal tube remains present with the tip at the level of the clavicles.  Enteric tube is present.  Right subclavian central line present with the tip in the mid SVC.  Bilateral perihilar and basilar predominant airspace disease most compatible with pulmonary edema.  Bilateral pleural effusions.  Basilar atelectasis.  IMPRESSION:  1.  Stable support apparatus. 2.  Worsening pulmonary aeration with increasing perihilar and basilar predominant airspace disease consistent with edema. 3.  Basilar atelectasis and bilateral pleural effusions.  Original Report Authenticated By: Andreas Newport, M.D.   Dg Chest Port 1 View  12/13/2011  *RADIOLOGY REPORT*  Clinical Data: Respiratory distress.  Central line placement.  PORTABLE CHEST - 1 VIEW  Comparison: Portable film earlier in the day.  Findings: Central venous line has been placed from a right subclavian approach.  Tip lies at the SVC/RA junction.  There is no visible pneumothorax.  Endotracheal tube lies 6 cm above the carina.  Bilateral perihilar air space disease has worsened consistent with progressive acute pulmonary edema.  Normal heart size.  Bones unremarkable.  IMPRESSION: Central venous catheter good position from a right subclavian approach.  Tip SVC/RA junction.  No pneumothorax.  Worsening pulmonary edema.  Original Report Authenticated By: Elsie Stain, M.D.   Dg Chest Portable 1 View  12/13/2011  *RADIOLOGY REPORT*  Clinical Data: Left-sided chest pain and pressure  PORTABLE CHEST - 1 VIEW  Comparison: 02/17/2011  Findings: Shallow inspiration with elevation of the left hemidiaphragm similar to previous study.  The heart size and pulmonary vascularity appear increased and there is new development of interstitial change suggesting pulmonary vascular congestion with mild interstitial edema.  No blunting of costophrenic angles. No pneumothorax.  No focal airspace consolidation.  Degenerative changes in the spine.   IMPRESSION: Increasing heart size and pulmonary vascular congestion with mild interstitial edema since previous study.  Original Report Authenticated By: Marlon Pel, M.D.     [facility]     Component Value Date/Time   NA 138 12/14/2011 0400   K 4.8 12/14/2011 0400   CL 102 12/14/2011 0400   CO2 26 12/14/2011 0400   GLUCOSE 191* 12/14/2011 0400   BUN 31* 12/14/2011 0400   CREATININE 2.33* 12/14/2011 0400   CALCIUM 8.7 12/14/2011 0400   PROT 6.0 12/13/2011 0252   ALBUMIN 3.1* 12/13/2011 0252   AST 17 12/13/2011 0252   ALT 13 12/13/2011 0252   ALKPHOS 57 12/13/2011 0252   BILITOT 0.4 12/13/2011 0252   GFRNONAA 24* 12/14/2011 0400   GFRAA 28* 12/14/2011 0400   CBC    Component Value Date/Time   WBC 11.0* 12/14/2011 0400   RBC 4.30 12/14/2011 0400   HGB 13.9 12/14/2011 0400   HCT 41.6 12/14/2011 0400   PLT 158 12/14/2011 0400   MCV 96.7 12/14/2011 0400   MCH 32.3 12/14/2011 0400   MCHC 33.4 12/14/2011 0400   RDW 13.4 12/14/2011 0400    Recent Labs  Basename 12/14/11 0400 12/13/11 0310 12/13/11 0252   HGB 13.9 11.9* 11.6*  ]  Recent Labs  Basename 12/14/11 0400 12/13/11 0310 12/13/11 0252   BUN 31* 20 20  ]  Assessment and Plan: 1. Acute pulmonary edema and acute resp failure d/t CAD and STEMI  - on 12/13/10: still on high fio2. Does not meet SBT criteria Plan Full vent sup- increase peep IV lasix drip BD neb meds Per cardiology  2. CKD    Lab 12/14/11 0400 12/13/11 0310 12/13/11 0252  CREATININE 2.33* 1.70* 1.79*    Monitor  3. Anemia  Lab 12/14/11 0400 12/13/11 0310 12/13/11 0252  HGB 13.9 11.9* 11.6*  HCT 41.6 35.0* 34.5*  WBC 11.0* -- 6.5  PLT 158 -- 124*   Monitor  4. HTN, hold BP meds for now   Best practices / Disposition: -->ICU status under PCCM -->full code -->SCD  for DVT Px -->Protonix for GI Px -->ventilator bundle -->diet  NPO -->family wife and bio dtr updated at bedside  The patient is critically ill with multiple organ systems failure and requires high complexity  decision making for assessment and support, frequent evaluation and titration of therapies, application of advanced monitoring technologies and extensive interpretation of multiple databases. Critical Care Time devoted to patient care services described in this note is 45  minutes.  Dr. Kalman Shan, M.D., [facility].C.P Pulmonary and Critical Care Medicine Staff Physician [facility] Pager: [phone], If no answer or between  15:00h - 7:00h: call [phone]

## 2011-12-14 NOTE — Progress Notes (Signed)
ANTICOAGULATION CONSULT NOTE - Follow Up Consult  Pharmacy Consult for: Heparin Indication: chest pain/ACS  No Known Allergies  Vital Signs: Temp: 99.9 F (37.7 C) (01/03 1530) Temp src: Oral (01/03 1530) BP: 119/56 mmHg (01/03 1925) Pulse Rate: 70  (01/03 1925)  Labs:  Basename 12/14/11 1850 12/14/11 0923 12/14/11 0400 12/13/11 1800 12/13/11 1133 12/13/11 0849 12/13/11 0745 12/13/11 0310 12/13/11 0252  HGB -- -- 13.9 -- -- -- -- 11.9* --  HCT -- -- 41.6 -- -- -- -- 35.0* 34.5*  PLT -- -- 158 -- -- -- -- -- 124*  APTT -- -- -- -- -- -- -- -- --  LABPROT -- -- 15.9* -- -- 14.8 -- -- --  INR -- -- 1.24 -- -- 1.14 -- -- --  HEPARINUNFRC 1.10* 0.61 -- -- 0.23* -- -- -- --  CREATININE -- -- 2.33* -- -- -- -- 1.70* 1.79*  CKTOTAL -- -- -- 1098* -- -- 449* -- --  CKMB -- -- -- 65.4* -- -- 26.6* -- --  TROPONINI -- -- -- >25.00* -- -- 13.96* -- --   Estimated Creatinine Clearance: 22.7 ml/min (by C-G formula based on Cr of 2.33).   Medications:  Heparin @ 1200 units/hr  Assessment: 85yom w/ NSTEMI resumed on heparin s/p cath 2/2 findings of multi-vessel disease.Heparin level has increased from 0.61 to 1.1?  Goal of Therapy:  Heparin level 0.3-0.7 units/ml   Plan:  1) Continue heparin @ 1000 units/hr  Merilynn Finland, [employer] 12/14/2011,7:38 PM

## 2011-12-14 NOTE — Progress Notes (Addendum)
INITIAL ADULT NUTRITION ASSESSMENT Date: 12/14/2011   Time: 3:06 PM  Reason for Assessment: VDRF  ASSESSMENT: Male 78 y.o.  Dx: NSTEMI (non-ST elevated myocardial infarction)  Hx:  Past Medical History  Diagnosis Date  . HYPERTENSION, HEART UNCONTROLLED W/O ASSOC CHF 02/03/2009  . Stroke 10.2.01  . History of colon cancer   . Heart murmur   . Hypertension     Related Meds:     . albuterol  2.5 mg Nebulization Q6H  . aspirin  324 mg Oral NOW   Or  . aspirin  300 mg Rectal NOW  . chlorhexidine      . etomidate      . fentaNYL      . fentaNYL  100 mcg Intravenous Once  . lidocaine (cardiac) 100 mg/54ml      . midazolam      . midazolam      . midazolam  2 mg Intravenous Once  . pantoprazole (PROTONIX) IV  40 mg Intravenous Daily  . rocuronium      . simvastatin  10 mg Per Tube QHS  . sodium chloride  3 mL Intravenous Q12H  . sodium chloride  3 mL Intravenous Q12H  . succinylcholine      . timolol  1 drop Both Eyes BID  . DISCONTD: aspirin EC  81 mg Oral Daily  . DISCONTD: cholecalciferol  2,000 Units Oral Daily  . DISCONTD: clopidogrel  600 mg Oral Once  . DISCONTD: clopidogrel  75 mg Oral Daily  . DISCONTD: metoprolol  50 mg Oral Daily  . DISCONTD: mulitivitamin with minerals  1 tablet Oral Daily  . DISCONTD: pantoprazole  40 mg Oral Q1200  . DISCONTD: simvastatin  10 mg Oral QHS  . DISCONTD: sodium chloride  3 mL Intravenous Q12H    Ht: 5\' 8"  (172.7 cm)  Wt: 147 lb 7.8 oz (66.9 kg)  Ideal Wt: 70 kg % Ideal Wt: 95%  Usual Wt: unable to obtain % Usual Wt: ---  Body mass index is 22.43 kg/(m^2).  Food/Nutrition Related Hx: no nutrition problems per nutrition screen  Labs:  CMP     Component Value Date/Time   NA 138 12/14/2011 0400   K 4.8 12/14/2011 0400   CL 102 12/14/2011 0400   CO2 26 12/14/2011 0400   GLUCOSE 191* 12/14/2011 0400   BUN 31* 12/14/2011 0400   CREATININE 2.33* 12/14/2011 0400   CALCIUM 8.7 12/14/2011 0400   PROT 6.0 12/13/2011 0252   ALBUMIN  3.1* 12/13/2011 0252   AST 17 12/13/2011 0252   ALT 13 12/13/2011 0252   ALKPHOS 57 12/13/2011 0252   BILITOT 0.4 12/13/2011 0252   GFRNONAA 24* 12/14/2011 0400   GFRAA 28* 12/14/2011 0400    I/O last 3 completed shifts: In: 487.7 [I.V.:457.7; NG/GT:20; IV Piggyback:10] Out: 726 [Urine:726] Total I/O In: 216.5 [I.V.:216.5] Out: 390 [Urine:390]  Diet Order: NPO  Supplements/Tube Feeding: N/A  IVF:    sodium chloride Last Rate: 12 mL/hr at 12/14/11 1300  fentaNYL infusion INTRAVENOUS Last Rate: 25 mcg/hr (12/14/11 1300)  furosemide (LASIX) infusion Last Rate: 8 mg/hr (12/14/11 1300)  heparin Last Rate: 1,200 Units/hr (12/14/11 1300)  midazolam (VERSED) infusion Last Rate: 1 mg/hr (12/14/11 1300)  DISCONTD: heparin Last Rate: Stopped (12/13/11 1354)  DISCONTD: heparin   DISCONTD: nitroGLYCERIN Last Rate: Stopped (12/13/11 1500)    Estimated Nutritional Needs:   Kcal: 1,500-1,600 Protein: 80-90 Fluid: 1.5-1.6 L  RD unable to obtain nutrition hx -- pt intubated & sedated; s/p  cardiac cath attempt 1/2 with subsequent acute pulmonary edema; does not meet SBT criteria; OGT in place; if extubation not expected in next 24-48 hours would initiate enteral nutrition support -- please see recommendations below  NUTRITION DIAGNOSIS: -Inadequate oral intake (NI-2.1).  Status: Ongoing  RELATED TO: inability to eat   AS EVIDENCE BY: NPO status  MONITORING/EVALUATION(Goals): Goal: Initiate EN within 24-48 hours if extubation not expected to meet >90% of minimum estimated nutrition needs Monitor: EN initiation, labs, weight, I/O's  EDUCATION NEEDS: -Education not appropriate at this time  INTERVENTION:  If EN initiated, recommend Promote formula -- initiate at 25 ml/hr, advance 10 ml every 4 hours until goal rate of 55 ml/hr to provide 1440 kcals, 83 gm protein, 1107 ml of free water  RD to follow for nutrition care plan  Dietitian #: [phone]  DOCUMENTATION CODES Per approved criteria    -Not Applicable    Alger Memos 12/14/2011, 3:06 PM

## 2011-12-14 NOTE — Progress Notes (Signed)
ANTICOAGULATION CONSULT NOTE - Follow Up Consult  Pharmacy Consult for: Heparin Indication: chest pain/ACS  No Known Allergies  Vital Signs: Temp: 99.3 F (37.4 C) (01/03 0800) Temp src: Oral (01/03 0800) BP: 119/60 mmHg (01/03 0800) Pulse Rate: 66  (01/03 0925)  Labs:  Basename 12/14/11 0923 12/14/11 0400 12/13/11 1800 12/13/11 1133 12/13/11 0849 12/13/11 0745 12/13/11 0310 12/13/11 0252  HGB -- 13.9 -- -- -- -- 11.9* --  HCT -- 41.6 -- -- -- -- 35.0* 34.5*  PLT -- 158 -- -- -- -- -- 124*  APTT -- -- -- -- -- -- -- --  LABPROT -- 15.9* -- -- 14.8 -- -- --  INR -- 1.24 -- -- 1.14 -- -- --  HEPARINUNFRC 0.61 -- -- 0.23* -- -- -- --  CREATININE -- 2.33* -- -- -- -- 1.70* 1.79*  CKTOTAL -- -- 1098* -- -- 449* -- --  CKMB -- -- 65.4* -- -- 26.6* -- --  TROPONINI -- -- >25.00* -- -- 13.96* -- --   Estimated Creatinine Clearance: 22.7 ml/min (by C-G formula based on Cr of 2.33).   Medications:  Heparin @ 1200 units/hr  Assessment: 85yom w/ NSTEMI resumed on heparin s/p cath 2/2 findings of multi-vessel disease. Initial heparin level (drawn ~ 2h late) after resuming heparin is therapeutic at 0.61. No bleeding noted.  Goal of Therapy:  Heparin level 0.3-0.7 units/ml   Plan:  1) Continue heparin @ 1200 units/hr 2) Check a confirmatory heparin level in 8 hours  Fredrik Rigger 12/14/2011,10:50 AM

## 2011-12-15 ENCOUNTER — Inpatient Hospital Stay (HOSPITAL_COMMUNITY): Payer: Medicare Other

## 2011-12-15 DIAGNOSIS — I214 Non-ST elevation (NSTEMI) myocardial infarction: Principal | ICD-10-CM

## 2011-12-15 LAB — BASIC METABOLIC PANEL
CO2: 29 mEq/L (ref 19–32)
Chloride: 101 mEq/L (ref 96–112)
GFR calc non Af Amer: 23 mL/min — ABNORMAL LOW (ref >90)
Glucose, Bld: 150 mg/dL — ABNORMAL HIGH (ref 70–99)
Potassium: 3.6 mEq/L (ref 3.5–5.1)
Sodium: 139 mEq/L (ref 135–145)

## 2011-12-15 LAB — GLUCOSE, CAPILLARY
Glucose-Capillary: 155 mg/dL — ABNORMAL HIGH (ref 70–99)
Glucose-Capillary: 154 mg/dL — ABNORMAL HIGH (ref 70–99)
Glucose-Capillary: 166 mg/dL — ABNORMAL HIGH (ref 70–99)

## 2011-12-15 LAB — CBC
MCH: 32.5 pg (ref 26.0–34.0)
MCV: 95.6 fL (ref 78.0–100.0)
Platelets: 160 10*3/uL (ref 150–400)
RBC: 4.06 MIL/uL — ABNORMAL LOW (ref 4.22–5.81)

## 2011-12-15 MED ORDER — BIOTENE DRY MOUTH MT LIQD
15.0000 mL | Freq: Four times a day (QID) | OROMUCOSAL | Status: DC
  Administered 2011-12-16 (×2): 15 mL via OROMUCOSAL

## 2011-12-15 MED ORDER — FUROSEMIDE 10 MG/ML IJ SOLN
40.0000 mg | Freq: Once | INTRAMUSCULAR | Status: AC
  Administered 2011-12-15: 40 mg via INTRAVENOUS
  Filled 2011-12-15: qty 4

## 2011-12-15 MED ORDER — FUROSEMIDE 10 MG/ML IJ SOLN
80.0000 mg | Freq: Two times a day (BID) | INTRAMUSCULAR | Status: DC
  Administered 2011-12-15 (×2): 80 mg via INTRAVENOUS
  Filled 2011-12-15 (×5): qty 8

## 2011-12-15 MED ORDER — TICAGRELOR 90 MG PO TABS
90.0000 mg | ORAL_TABLET | Freq: Two times a day (BID) | ORAL | Status: DC
  Administered 2011-12-16 – 2011-12-23 (×15): 90 mg via ORAL
  Filled 2011-12-15 (×17): qty 1

## 2011-12-15 MED ORDER — FENTANYL CITRATE 0.05 MG/ML IJ SOLN
25.0000 ug | INTRAMUSCULAR | Status: DC | PRN
  Administered 2011-12-15 (×3): 50 ug via INTRAVENOUS
  Filled 2011-12-15 (×3): qty 2

## 2011-12-15 MED ORDER — HEPARIN SOD (PORCINE) IN D5W 100 UNIT/ML IV SOLN
1100.0000 [IU]/h | INTRAVENOUS | Status: DC
  Administered 2011-12-15 (×2): 1100 [IU]/h via INTRAVENOUS
  Administered 2011-12-16: 1050 [IU]/h via INTRAVENOUS
  Administered 2011-12-17: 1100 [IU]/h via INTRAVENOUS
  Filled 2011-12-15 (×6): qty 250

## 2011-12-15 MED ORDER — CHLORHEXIDINE GLUCONATE 0.12 % MT SOLN
15.0000 mL | Freq: Two times a day (BID) | OROMUCOSAL | Status: DC
  Administered 2011-12-15 – 2011-12-16 (×2): 15 mL via OROMUCOSAL
  Filled 2011-12-15 (×2): qty 15

## 2011-12-15 MED ORDER — PROMOTE PO LIQD
25.0000 mL/h | ORAL | Status: DC
  Filled 2011-12-15 (×5): qty 1000

## 2011-12-15 NOTE — Progress Notes (Signed)
Fentanyl 75 and versed 25 wasted in sink. Witnessed by crystal rice rn

## 2011-12-15 NOTE — Progress Notes (Addendum)
Nutrition Consult/Follow-up  RD consult for EN initiation and management. Initial nutrition assessment completed 1/3.  Diet Order:  NPO  Estimated Nutrition Needs: 1,500-1,600 kcals, 80-90 gm protein  Meds: Scheduled Meds:   . albuterol  2.5 mg Nebulization Q6H  . aspirin  324 mg Oral NOW   Or  . aspirin  300 mg Rectal NOW  . furosemide  80 mg Intravenous BID  . pantoprazole (PROTONIX) IV  40 mg Intravenous Daily  . simvastatin  10 mg Per Tube QHS  . sodium chloride  3 mL Intravenous Q12H  . sodium chloride  3 mL Intravenous Q12H  . timolol  1 drop Both Eyes BID  . DISCONTD: midazolam  2 mg Intravenous Once   Continuous Infusions:   . sodium chloride 12 mL/hr at 12/14/11 1800  . heparin 1,000 Units/hr (12/14/11 1940)  . DISCONTD: fentaNYL infusion INTRAVENOUS 25 mcg/hr (12/14/11 1800)  . DISCONTD: furosemide (LASIX) infusion 8 mg/hr (12/14/11 1800)  . DISCONTD: midazolam (VERSED) infusion 1 mg/hr (12/14/11 1829)   PRN Meds:.sodium chloride, fentaNYL, sodium chloride, sodium chloride, DISCONTD: acetaminophen, DISCONTD: ondansetron (ZOFRAN) IV  Labs:  CMP     Component Value Date/Time   NA 139 12/15/2011 0515   K 3.6 12/15/2011 0515   CL 101 12/15/2011 0515   CO2 29 12/15/2011 0515   GLUCOSE 150* 12/15/2011 0515   BUN 41* 12/15/2011 0515   CREATININE 2.43* 12/15/2011 0515   CALCIUM 8.9 12/15/2011 0515   PROT 6.0 12/13/2011 0252   ALBUMIN 3.1* 12/13/2011 0252   AST 17 12/13/2011 0252   ALT 13 12/13/2011 0252   ALKPHOS 57 12/13/2011 0252   BILITOT 0.4 12/13/2011 0252   GFRNONAA 23* 12/15/2011 0515   GFRAA 27* 12/15/2011 0515     Intake/Output Summary (Last 24 hours) at 12/15/11 1017 Last data filed at 12/15/11 0900  Gross per 24 hour  Intake 762.25 ml  Output   1450 ml  Net -687.75 ml    Weight Status:  66.5 kg (1/4) -- stable  Nutrition Dx:  Inadequate Oral Intake, ongoing  Goal:  EN to meet >90% of minimum estimated nutrition needs, currently unmet Monitor: EN tolerance, weight,  labs, I/O's  Intervention:    Initiate Promote formula at 25 ml/hr via OGT, advance 10 ml every 4 hours until goal rate of 55 ml/hr to provide 1440 kcals, 83 gm protein, 1107 ml of free water  RD to follow for nutrition care plan   Alger Memos Pager #:  [phone]

## 2011-12-15 NOTE — Progress Notes (Signed)
Patient Name: [name] [name]      SUBJECTIVE:responsive to voice and denies ch pain or sob--intubated  Past Medical History  Diagnosis Date  . HYPERTENSION, HEART UNCONTROLLED W/O ASSOC CHF 02/03/2009  . Stroke 10.2.01  . History of colon cancer   . Heart murmur   . Hypertension     PHYSICAL EXAM Filed Vitals:   12/15/11 0400 12/15/11 0500 12/15/11 0600 12/15/11 0729  BP:  152/63 134/57   Pulse: 64 69 67   Temp:      TempSrc:      Resp: 12 17 12    Height:      Weight:  146 lb 9.7 oz (66.5 kg)    SpO2: 96% 100% 96% 97%    General appearance: intubated reduced vent support  40% O2 Neck: no JVD-8cm  Lungs: clear to auscultation bilaterally and anteriorly Heart: regular rate and rhythm, S1, S2 normal, no murmur, click, rub or gallop Extremities: edema trace Skin: Skin color, texture, turgor normal. No rashes or lesions Neurologic: Grossly normal   TELEMETRY: Reviewed telemetry pt in nsr :    Intake/Output Summary (Last 24 hours) at 12/15/11 0811 Last data filed at 12/15/11 0600  Gross per 24 hour  Intake    767 ml  Output   1450 ml  Net   -683 ml    LABS: Basic Metabolic Panel:  Lab 12/15/11 1610 12/14/11 0400 12/13/11 0310 12/13/11 0252  NA 139 138 140 137  K 3.6 4.8 4.2 4.1  CL 101 102 105 102  CO2 29 26 -- 26  GLUCOSE 150* 191* 182* 180*  BUN 41* 31* 20 20  CREATININE 2.43* 2.33* 1.70* 1.79*  CALCIUM 8.9 8.7 -- --  MG -- -- -- --  PHOS -- -- -- --   Cardiac Enzymes:  Basename 12/13/11 1800 12/13/11 0745  CKTOTAL 1098* 449*  CKMB 65.4* 26.6*  CKMBINDEX -- --  TROPONINI >25.00* 13.96*   CBC:  Lab 12/14/11 0400 12/13/11 0310 12/13/11 0252  WBC 11.0* -- 6.5  NEUTROABS -- -- --  HGB 13.9 11.9* 11.6*  HCT 41.6 35.0* 34.5*  MCV 96.7 -- 96.1  PLT 158 -- 124*   PROTIME:  Basename 12/14/11 0400 12/13/11 0849  LABPROT 15.9* 14.8  INR 1.24 1.14   Liver Function Tests:  Basename 12/13/11 0252  AST 17  ALT 13  ALKPHOS 57  BILITOT 0.4    PROT 6.0  ALBUMIN 3.1*    Fasting Lipid Panel:  Basename 12/14/11 0400  CHOL 124  HDL 47  LDLCALC 63  TRIG 69  CHOLHDL 2.6  LDLDIRECT --    ECHO Left ventricle: The cavity size was normal. Wall thickness was normal. Systolic function was severely reduced. The estimated ejection fraction was in the range of 25% to 30%. Diffuse hypokinesis. There is akinesis of the posterolateral myocardium. Features are consistent with a pseudonormal left ventricular filling pattern, with concomitant abnormal relaxation and increased filling pressure (grade 2 diastolic dysfunction). - Aortic valve: Valve mobility was restricted. There was moderate to severe stenosis. Valve area: 1.03cm^2(VTI). Valve area: 1.11cm^2 (Vmax). - Mitral valve: Calcified annulus. Mild to moderate regurgitation. - Left atrium: The atrium was moderately dilated      ASSESSMENT AND PLAN:  Status post nonSTEMI complicated by acute pulmonary edema Echo reports aortic stenosis as well as mitral regurgitation neither of which is audible on examination. He is gradually improving from a pulmonary point of view and leak in further weAn  over the next 24 hours. We  will need to review revascularization options with Dr. [place].  Renal insufficiency has also raised his head, not unexpectedly The rate of rise is slowing so hopefully  he is approacjhing his [place] and will begin to improve his renal function.  We will utilize antiplatelet therapy; anticipate initiation of beta blocker therapy within the next few days and ACE inhibitors if his creatinine tolerates and hydralazine nitrates if they do not.  I reviewed with the family that the course is still treacherous.  Today will change lasix to iv bolus and await extubation  Patient Active Hospital Problem List: NSTEMI (non-ST elevated myocardial infarction) (12/13/2011)   Acute systolic heart failure (12/14/2011)   Acute on chronic renal failure (12/13/2011)     REspiratory  failure    Signed, Sherryl Manges MD  12/15/2011

## 2011-12-15 NOTE — Progress Notes (Signed)
Name: [name] [name] MRN: [id number] DOB: 17-Dec-1925    LOS: 2  PCCM  CONSULT NOTE  History of Present Illness: Elderly M with CAD s/p cath attempt this am with subsequent acute pulm edema.  PCCM asked to help with care . Pt intubated and CVL placed.  Pt with STEMI on admit  Lines / Drains: ETT 12/12/10>>> CVL R [place] 12/12/10>>>   Cultures: None Recent Results (from the past 240 hour(s))  MRSA PCR SCREENING     Status: Normal   Collection Time   12/13/11  8:49 AM      Component Value Range Status Comment   MRSA by PCR NEGATIVE  NEGATIVE  Final      Antibiotics: Anti-infectives    None      Tests / Events: 1/2>>cath attempt no PCI done   INTERVAL HX and OVERNIGHT:  Diuresed well. Fio2 down to 40%/peep 7. Cards going to change to lasix bolus from gtt On continous sedation -  RASS -1, CAM-ICU negative for delirium.  Afrebrile  Vital Signs: Temp:  [97.5 F (36.4 C)-99.9 F (37.7 C)] 98.2 F (36.8 C) (01/04 0800) Pulse Rate:  [62-77] 73  (01/04 0900) Resp:  [12-20] 20  (01/04 0900) BP: (114-152)/(52-98) 131/98 mmHg (01/04 0900) SpO2:  [96 %-100 %] 100 % (01/04 0900) FiO2 (%):  [39.6 %-69.7 %] 39.9 % (01/04 0900) Weight:  [66.5 kg (146 lb 9.7 oz)] 146 lb 9.7 oz (66.5 kg) (01/04 0500) I/O last 3 completed shifts: In: 1258.8 [I.V.:1238.8; NG/GT:20] Out: 2115 [Urine:2115]  Physical Examination: General:  Elde rly M on vent Neuro:  On continous sedation -  RASS -1, CAM-ICU negative for delirium. HEENT:  JVD noted.  orophyx clear Neck:  supple   Cardiovascular:  RRR nl s1 s2 no s3/s4  Lungs:  Rales improved Abdomen:  Soft NT BSA Musculoskeletal:  FROM Skin:  Clear   Ventilator settings: Vent Mode:  [-] PRVC FiO2 (%):  [39.6 %-69.7 %] 39.9 % Set Rate:  [4 bmp-12 bmp] 12 bmp Vt Set:  [500 mL] 500 mL PEEP:  [4.8 cmH20-7 cmH20] 4.8 cmH20 Pressure Support:  [5 cmH20] 5 cmH20 Plateau Pressure:  [15 cmH20-17 cmH20] 16 cmH20  Labs and Imaging:  Portable Chest Xray In  Am  12/14/2011  *RADIOLOGY REPORT*  Clinical Data: Endotracheal tube.  Short of breath.  PORTABLE CHEST - 1 VIEW  Comparison: 12/13/2011.  Findings: Endotracheal tube remains present with the tip at the level of the clavicles.  Enteric tube is present.  Right subclavian central line present with the tip in the mid SVC.  Bilateral perihilar and basilar predominant airspace disease most compatible with pulmonary edema.  Bilateral pleural effusions.  Basilar atelectasis.  IMPRESSION:  1.  Stable support apparatus. 2.  Worsening pulmonary aeration with increasing perihilar and basilar predominant airspace disease consistent with edema. 3.  Basilar atelectasis and bilateral pleural effusions.  Original Report Authenticated By: Andreas Newport, M.D.   Dg Chest Port 1 View  12/13/2011  *RADIOLOGY REPORT*  Clinical Data: Respiratory distress.  Central line placement.  PORTABLE CHEST - 1 VIEW  Comparison: Portable film earlier in the day.  Findings: Central venous line has been placed from a right subclavian approach.  Tip lies at the SVC/RA junction.  There is no visible pneumothorax.  Endotracheal tube lies 6 cm above the carina.  Bilateral perihilar air space disease has worsened consistent with progressive acute pulmonary edema.  Normal heart size.  Bones unremarkable.  IMPRESSION: Central venous catheter good position  from a right subclavian approach.  Tip SVC/RA junction.  No pneumothorax.  Worsening pulmonary edema.  Original Report Authenticated By: Elsie Stain, M.D.     [facility]     Component Value Date/Time   NA 139 12/15/2011 0515   K 3.6 12/15/2011 0515   CL 101 12/15/2011 0515   CO2 29 12/15/2011 0515   GLUCOSE 150* 12/15/2011 0515   BUN 41* 12/15/2011 0515   CREATININE 2.43* 12/15/2011 0515   CALCIUM 8.9 12/15/2011 0515   PROT 6.0 12/13/2011 0252   ALBUMIN 3.1* 12/13/2011 0252   AST 17 12/13/2011 0252   ALT 13 12/13/2011 0252   ALKPHOS 57 12/13/2011 0252   BILITOT 0.4 12/13/2011 0252   GFRNONAA 23* 12/15/2011 0515    GFRAA 27* 12/15/2011 0515   CBC    Component Value Date/Time   WBC 11.0* 12/14/2011 0400   RBC 4.30 12/14/2011 0400   HGB 13.9 12/14/2011 0400   HCT 41.6 12/14/2011 0400   PLT 158 12/14/2011 0400   MCV 96.7 12/14/2011 0400   MCH 32.3 12/14/2011 0400   MCHC 33.4 12/14/2011 0400   RDW 13.4 12/14/2011 0400    Recent Labs  Basename 12/14/11 0400 12/13/11 0310 12/13/11 0252   HGB 13.9 11.9* 11.6*  ]  Recent Labs  Basename 12/15/11 0515 12/14/11 0400 12/13/11 0310 12/13/11 0252   BUN 41* 31* 20 20  ]  Assessment and Plan: 1. Acute pulmonary edema and acute resp failure d/t CAD and STEMI  - on 12/14/10: Meets SBT criteria Plan SBT today 12/15/11 but do not extubate until BNP improves significantly and cxr shows improved edema Might even need T piece trial to ensure loss of peep after extubation will not result in failed extubation Change to prn sedatino 12/15/11 without benzo but fentanyl alone Contninue diuresis Get nutrtion consult  2. CKD    Lab 12/15/11 0515 12/14/11 0400 12/13/11 0310 12/13/11 0252  CREATININE 2.43* 2.33* 1.70* 1.79*    Monitor  3. Anemia  Lab 12/14/11 0400 12/13/11 0310 12/13/11 0252  HGB 13.9 11.9* 11.6*  HCT 41.6 35.0* 34.5*  WBC 11.0* -- 6.5  PLT 158 -- 124*   Monitor. PRBC for hgb < 8gm%  4. HTN, hold BP meds for now   Best practices / Disposition: -->ICU status -->full code -->SCD  for DVT Px aliong with cardiac anticoagulant -->Protonix for GI Px -->ventilator bundle -->diet  -start tube feeds -->family wife and bio dtr x 2 updated at bedside  The patient is critically ill with multiple organ systems failure and requires high complexity decision making for assessment and support, frequent evaluation and titration of therapies, application of advanced monitoring technologies and extensive interpretation of multiple databases. Critical Care Time devoted to patient care services described in this note is 35  minutes.  Dr. Kalman Shan, M.D.,  [facility].C.P Pulmonary and Critical Care Medicine Staff Physician [facility] [place] [facility] Pager: [phone], If no answer or between  15:00h - 7:00h: call [phone]  12/15/2011

## 2011-12-15 NOTE — Progress Notes (Signed)
ANTICOAGULATION CONSULT NOTE - Follow Up Consult  Pharmacy Consult for: Heparin Indication: NSTEMI s/p cath w/ multi-vessel dz  No Known Allergies  Vital Signs: Temp: 99.4 F (37.4 C) (01/04 1140) Temp src: Oral (01/04 1140) BP: 150/86 mmHg (01/04 1100) Pulse Rate: 88  (01/04 1100)  Labs:  Basename 12/15/11 0947 12/15/11 0515 12/14/11 1850 12/14/11 0923 12/14/11 0400 12/13/11 1800 12/13/11 0849 12/13/11 0745 12/13/11 0310 12/13/11 0252  HGB -- -- -- -- 13.9 -- -- -- 11.9* --  HCT -- -- -- -- 41.6 -- -- -- 35.0* 34.5*  PLT -- -- -- -- 158 -- -- -- -- 124*  APTT -- -- -- -- -- -- -- -- -- --  LABPROT -- -- -- -- 15.9* -- 14.8 -- -- --  INR -- -- -- -- 1.24 -- 1.14 -- -- --  HEPARINUNFRC 0.31 -- 1.10* 0.61 -- -- -- -- -- --  CREATININE -- 2.43* -- -- 2.33* -- -- -- 1.70* --  CKTOTAL -- -- -- -- -- 1098* -- 449* -- --  CKMB -- -- -- -- -- 65.4* -- 26.6* -- --  TROPONINI -- -- -- -- -- >25.00* -- 13.96* -- --   Estimated Creatinine Clearance: 21.7 ml/min (by C-G formula based on Cr of 2.43).   Medications:  Heparin @ 1000 units/hr  Assessment: 85yom continues on heparin s/p cath. Heparin level of 0.31 is at goal. No bleeding noted. Will increase rate slightly to try and prevent subtherapeutic level.  Goal of Therapy:  Heparin level 0.3-0.7 units/ml   Plan:  1) Increase heparin to 1100 units/hr 2) Follow up heparin level in AM  Fredrik Rigger 12/15/2011,11:41 AM

## 2011-12-16 ENCOUNTER — Inpatient Hospital Stay (HOSPITAL_COMMUNITY): Payer: Medicare Other

## 2011-12-16 DIAGNOSIS — J810 Acute pulmonary edema: Secondary | ICD-10-CM

## 2011-12-16 DIAGNOSIS — J9600 Acute respiratory failure, unspecified whether with hypoxia or hypercapnia: Secondary | ICD-10-CM

## 2011-12-16 DIAGNOSIS — I214 Non-ST elevation (NSTEMI) myocardial infarction: Secondary | ICD-10-CM

## 2011-12-16 DIAGNOSIS — Z9911 Dependence on respirator [ventilator] status: Secondary | ICD-10-CM

## 2011-12-16 LAB — GLUCOSE, CAPILLARY
Glucose-Capillary: 151 mg/dL — ABNORMAL HIGH (ref 70–99)
Glucose-Capillary: 169 mg/dL — ABNORMAL HIGH (ref 70–99)
Glucose-Capillary: 195 mg/dL — ABNORMAL HIGH (ref 70–99)

## 2011-12-16 LAB — BASIC METABOLIC PANEL
BUN: 47 mg/dL — ABNORMAL HIGH (ref 6–23)
Calcium: 9.3 mg/dL (ref 8.4–10.5)
GFR calc Af Amer: 26 mL/min — ABNORMAL LOW (ref >90)
GFR calc non Af Amer: 23 mL/min — ABNORMAL LOW (ref >90)
Glucose, Bld: 153 mg/dL — ABNORMAL HIGH (ref 70–99)
Potassium: 3.2 mEq/L — ABNORMAL LOW (ref 3.5–5.1)
Sodium: 144 mEq/L (ref 135–145)

## 2011-12-16 LAB — HEPARIN LEVEL (UNFRACTIONATED): Heparin Unfractionated: 0.72 IU/mL — ABNORMAL HIGH (ref 0.30–0.70)

## 2011-12-16 MED ORDER — FUROSEMIDE 10 MG/ML IJ SOLN
40.0000 mg | Freq: Two times a day (BID) | INTRAMUSCULAR | Status: DC
  Administered 2011-12-16: 40 mg via INTRAVENOUS
  Filled 2011-12-16 (×4): qty 4

## 2011-12-16 MED ORDER — CARVEDILOL 3.125 MG PO TABS
3.1250 mg | ORAL_TABLET | Freq: Two times a day (BID) | ORAL | Status: DC
  Administered 2011-12-16 – 2011-12-17 (×3): 3.125 mg via ORAL
  Filled 2011-12-16 (×7): qty 1

## 2011-12-16 MED ORDER — PANTOPRAZOLE SODIUM 40 MG PO TBEC
40.0000 mg | DELAYED_RELEASE_TABLET | Freq: Every day | ORAL | Status: DC
  Administered 2011-12-16 – 2011-12-23 (×8): 40 mg via ORAL
  Filled 2011-12-16 (×9): qty 1

## 2011-12-16 NOTE — Progress Notes (Signed)
Name: [name] [name] MRN: [id number] DOB: June 14, 1926    LOS: 3  PCCM  CONSULT NOTE  History of Present Illness: Elderly M with CAD s/p cath attempt this am with subsequent acute pulm edema.  PCCM asked to help with care . Pt intubated and CVL placed.  Pt with STEMI on admit  Lines / Drains: ETT 12/12/10>>> CVL R [place] 12/12/10>>>   Cultures: None Recent Results (from the past 240 hour(s))  MRSA PCR SCREENING     Status: Normal   Collection Time   12/13/11  8:49 AM      Component Value Range Status Comment   MRSA by PCR NEGATIVE  NEGATIVE  Final      Antibiotics: Anti-infectives    None      Tests / Events: 1/2>>cath attempt no PCI done   INTERVAL HX and OVERNIGHT:  1/5> Weaned yesterday most of day 5/5 -tolerated well.  Neg 3L balance yesterday, CVP 4 Pt is awake and communicates wants ETT out "now"  Several family members at bedside-updated   Vital Signs: Temp:  [98 F (36.7 C)-99.4 F (37.4 C)] 98.5 F (36.9 C) (01/05 0347) Pulse Rate:  [64-102] 71  (01/05 0333) Resp:  [12-26] 16  (01/05 0700) BP: (127-173)/(54-119) 131/58 mmHg (01/05 0400) SpO2:  [94 %-100 %] 97 % (01/05 0700) FiO2 (%):  [39.6 %-40.3 %] 39.8 % (01/05 0700) Weight:  [61.5 kg (135 lb 9.3 oz)] 135 lb 9.3 oz (61.5 kg) (01/05 0442) I/O last 3 completed shifts: In: 943.8 [I.V.:935.8; IV Piggyback:8] Out: 4540 [Urine:4540]  Physical Examination: General:  Elde rly M on vent Neuro:  A/O x 3 , very little sedation needed-none for 12 hr  HEENT:  JVD noted.  ETT in place  Neck:  supple   Cardiovascular:  RRR nl s1 s2 no s3/s4 , no edema, CVP 4  Lungs: decreased BS in bases  Abdomen:  Soft NT BSA Musculoskeletal: nm Skin:  Clear   Ventilator settings: Vent Mode:  [-] PRVC FiO2 (%):  [39.6 %-40.3 %] 39.8 % Set Rate:  [12 bmp] 12 bmp Vt Set:  [500 mL] 500 mL PEEP:  [4.6 cmH20-5 cmH20] 5 cmH20 Pressure Support:  [5 cmH20] 5 cmH20 Plateau Pressure:  [14 cmH20-15 cmH20] 14 cmH20  Labs and  Imaging:  Dg Chest Port 1 View  12/15/2011  *RADIOLOGY REPORT*  Clinical Data: ETT  PORTABLE CHEST - 1 VIEW  Comparison: 12/14/2011  Findings: Endotracheal tube terminates 5 cm above the carina.  Moderate interstitial edema.  Suspected bilateral pleural effusions.  No pneumothorax.  The heart is top normal in size.  Stable right subclavian venous catheter.  Degenerative changes of the visualized thoracolumbar spine.  IMPRESSION: Endotracheal tube terminates 5 cm above the carina.  Moderate interstitial edema with suspected bilateral pleural effusions.  Original Report Authenticated By: Charline Bills, M.D.     [facility]     Component Value Date/Time   NA 139 12/15/2011 0515   K 3.6 12/15/2011 0515   CL 101 12/15/2011 0515   CO2 29 12/15/2011 0515   GLUCOSE 150* 12/15/2011 0515   BUN 41* 12/15/2011 0515   CREATININE 2.43* 12/15/2011 0515   CALCIUM 8.9 12/15/2011 0515   PROT 6.0 12/13/2011 0252   ALBUMIN 3.1* 12/13/2011 0252   AST 17 12/13/2011 0252   ALT 13 12/13/2011 0252   ALKPHOS 57 12/13/2011 0252   BILITOT 0.4 12/13/2011 0252   GFRNONAA 23* 12/15/2011 0515   GFRAA 27* 12/15/2011 0515   CBC  Component Value Date/Time   WBC 10.4 12/15/2011 1524   RBC 4.06* 12/15/2011 1524   HGB 13.2 12/15/2011 1524   HCT 38.8* 12/15/2011 1524   PLT 160 12/15/2011 1524   MCV 95.6 12/15/2011 1524   MCH 32.5 12/15/2011 1524   MCHC 34.0 12/15/2011 1524   RDW 13.0 12/15/2011 1524    Recent Labs  Basename 12/15/11 1524 12/14/11 0400   HGB 13.2 13.9  ]  Recent Labs  Basename 12/15/11 0515 12/14/11 0400   BUN 41* 31*  ]  Assessment and Plan: 1. Acute pulmonary edema and acute resp failure d/t CAD and STEMI  - on 1/512: improved aeration on xr, Neg i/o bal.  Weaned well. Off sedation.  Plan Plan to extubate today.  Check xray in am  Will discuss with cards regarding diuresis ( scr tr up and cvp low-appears very dry)   2. CKD    Lab 12/15/11 0515 12/14/11 0400 12/13/11 0310 12/13/11 0252  CREATININE 2.43* 2.33* 1.70* 1.79*    1/5>scr tr up with diuresis -discuss with cards  bmet in am   3. Anemia  Lab 12/15/11 1524 12/14/11 0400 12/13/11 0310 12/13/11 0252  HGB 13.2 13.9 11.9* --  HCT 38.8* 41.6 35.0* --  WBC 10.4 11.0* -- 6.5  PLT 160 158 -- 124*   Monitor. PRBC for hgb < 8gm%  4. HTN, hold BP meds for now   5. CAD/STEMI -on hep drip  Per Cards   Best practices / Disposition: -->ICU status -->full code -->SCD  for DVT Px aliong with cardiac anticoagulant -->Protonix for GI Px -->ventilator bundle -->family wife /family updated at bedside  Rubye Oaks NP-C  [facility] Pulmonary/Critical Care  [phone]  12/16/2011  Pt seen and examined and database reviewed. I agree with above findings, assessment and plan  Billy Fischer, MD;  PCCM service; Mobile [phone]

## 2011-12-16 NOTE — Progress Notes (Signed)
Pt tol ice chips well--tol meds in apple sauce without difficulty. Family at bedside.

## 2011-12-16 NOTE — Progress Notes (Addendum)
Patient Name: [name] [name]      SUBJECTIVE: --intubated but wide denies chest pain radiating to his tube out  Past Medical History  Diagnosis Date  . HYPERTENSION, HEART UNCONTROLLED W/O ASSOC CHF 02/03/2009  . Stroke 10.2.01  . History of colon cancer   . Heart murmur   . Hypertension     PHYSICAL EXAM Filed Vitals:   12/16/11 0700 12/16/11 0752 12/16/11 0755 12/16/11 0800  BP:    154/68  Pulse:    72  Temp:   99.2 F (37.3 C)   TempSrc:   Oral   Resp: 16   24  Height:      Weight:      SpO2: 97% 96%  98%    General appearance: intubated reduced vent support  40% O2 Neck: no JVD-8cm  Lungs: clear to auscultation bilaterally and anteriorly Heart: regular rate and rhythm, S1, S2 normal, 2/6 murmur at the left lower sternal border going to the apex Extremities: edema trace Skin: Skin color, texture, turgor normal. No rashes or lesions Neurologic: Grossly normal   TELEMETRY: Reviewed telemetry pt in nsr :    Intake/Output Summary (Last 24 hours) at 12/16/11 0809 Last data filed at 12/16/11 0800  Gross per 24 hour  Intake    522 ml  Output   3140 ml  Net  -2618 ml    LABS: Basic Metabolic Panel:  Lab 12/15/11 1610 12/14/11 0400 12/13/11 0310 12/13/11 0252  NA 139 138 140 137  K 3.6 4.8 4.2 4.1  CL 101 102 105 102  CO2 29 26 -- 26  GLUCOSE 150* 191* 182* 180*  BUN 41* 31* 20 20  CREATININE 2.43* 2.33* 1.70* 1.79*  CALCIUM 8.9 8.7 -- --  MG -- -- -- --  PHOS -- -- -- --   Cardiac Enzymes:  Basename 12/13/11 1800  CKTOTAL 1098*  CKMB 65.4*  CKMBINDEX --  TROPONINI >25.00*   CBC:  Lab 12/15/11 1524 12/14/11 0400 12/13/11 0310 12/13/11 0252  WBC 10.4 11.0* -- 6.5  NEUTROABS -- -- -- --  HGB 13.2 13.9 11.9* 11.6*  HCT 38.8* 41.6 35.0* 34.5*  MCV 95.6 96.7 -- 96.1  PLT 160 158 -- 124*   PROTIME:  Basename 12/14/11 0400 12/13/11 0849  LABPROT 15.9* 14.8  INR 1.24 1.14   Liver Function Tests: No results found for this basename:  AST:2,ALT:2,ALKPHOS:2,BILITOT:2,PROT:2,ALBUMIN:2 in the last 72 hours  Fasting Lipid Panel:  Basename 12/14/11 0400  CHOL 124  HDL 47  LDLCALC 63  TRIG 69  CHOLHDL 2.6  LDLDIRECT --    ECHO Left ventricle: The cavity size was normal. Wall thickness was normal. Systolic function was severely reduced. The estimated ejection fraction was in the range of 25% to 30%. Diffuse hypokinesis. There is akinesis of the posterolateral myocardium. Features are consistent with a pseudonormal left ventricular filling pattern, with concomitant abnormal relaxation and increased filling pressure (grade 2 diastolic dysfunction). - Aortic valve: Valve mobility was restricted. There was moderate to severe stenosis. Valve area: 1.03cm^2(VTI). Valve area: 1.11cm^2 (Vmax). - Mitral valve: Calcified annulus. Mild to moderate regurgitation. - Left atrium: The atrium was moderately dilated      ASSESSMENT AND PLAN:  Status post nonSTEMI complicated by acute pulmonary edema Echo reports aortic stenosis as well as mitral regurgitation . His murmur is now audible.   Is vastly improved from [name] review and we expect extubation. There is a vigorous diuresis yesterday and we will need to wait and see what his  creatinine is doing prior to the initiation of afterload reduction.  We will begin beta blocker therapy and continued use of antiplatelet therapy.Add tricagelor per Dr Margaretha Seeds review with Dr. [place] next week revascularization options.  Lipids presently well controlled on statin therapy at 10 mg a day Patient Active Hospital Problem List: NSTEMI (non-ST elevated myocardial infarction) (12/13/2011)   Acute systolic heart failure (12/14/2011)   Acute on chronic renal failure (12/13/2011)     REspiratory failure    Signed, [name] Manges MD  12/16/2011

## 2011-12-16 NOTE — Progress Notes (Signed)
ANTICOAGULATION CONSULT NOTE - Follow Up Consult  Pharmacy Consult for Heparin  Indication: NSTEMI s/p cath w/ multi-vessel dz   Assessment:  69 YOM continues on heparin s/p cath. Heparin level 0.72 (up from 0.31 yesterday). No bleeding noted. CBC ok. No issues with infusion.   Goal of Therapy:  Heparin level 0.3-0.7 units/ml   Plan:  1. Decrease heparin to1050 units/hr (87ml/hr) 2. F/u AM heparin level   Thank you,  Brett Fairy, PharmD Pager: [phone]  12/16/2011 3:05 PM   No Known Allergies  Patient Measurements: Height: 5\' 8"  (172.7 cm) Weight: 135 lb 9.3 oz (61.5 kg) IBW/kg (Calculated) : 68.4    Vital Signs: Temp: 98.2 F (36.8 C) (01/05 1149) Temp src: Oral (01/05 1149) BP: 121/87 mmHg (01/05 1400) Pulse Rate: 77  (01/05 1400)  Labs:  Basename 12/16/11 1158 12/16/11 0600 12/15/11 1524 12/15/11 0947 12/15/11 0515 12/14/11 0400 12/13/11 1800  HGB -- -- 13.2 -- -- 13.9 --  HCT -- -- 38.8* -- -- 41.6 --  PLT -- -- 160 -- -- 158 --  APTT -- -- -- -- -- -- --  LABPROT -- -- -- -- -- 15.9* --  INR -- -- -- -- -- 1.24 --  HEPARINUNFRC 0.72* 0.63 -- 0.31 -- -- --  CREATININE -- 2.45* -- -- 2.43* 2.33* --  CKTOTAL -- -- -- -- -- -- 1098*  CKMB -- -- -- -- -- -- 65.4*  TROPONINI -- -- -- -- -- -- >25.00*   Estimated Creatinine Clearance: 19.9 ml/min (by C-G formula based on Cr of 2.45).   Medications:  Scheduled:    . albuterol  2.5 mg Nebulization Q6H  . carvedilol  3.125 mg Oral BID WC  . feeding supplement (PROMOTE)  25-55 mL/hr Per Tube Q18H  . furosemide  40 mg Intravenous Once  . furosemide  40 mg Intravenous BID  . pantoprazole  40 mg Oral Q1200  . simvastatin  10 mg Per Tube QHS  . sodium chloride  3 mL Intravenous Q12H  . sodium chloride  3 mL Intravenous Q12H  . Ticagrelor  90 mg Oral BID  . timolol  1 drop Both Eyes BID  . DISCONTD: antiseptic oral rinse  15 mL Mouth Rinse QID  . DISCONTD: chlorhexidine  15 mL Mouth Rinse BID  . DISCONTD:  furosemide  80 mg Intravenous BID  . DISCONTD: pantoprazole (PROTONIX) IV  40 mg Intravenous Daily   Infusions:    . sodium chloride 12 mL/hr at 12/14/11 1800  . heparin 1,100 Units/hr (12/15/11 2131)   PRN: sodium chloride, sodium chloride, sodium chloride, DISCONTD: fentaNYL  Bufford Buttner, Logun Colavito N 12/16/2011,3:02 PM

## 2011-12-16 NOTE — Progress Notes (Signed)
ANTICOAGULATION CONSULT NOTE - Follow Up Consult  Pharmacy Consult for Heparin  Indication: NSTEMI s/p cath w/ multi-vessel dz  Assessment: 59 YOM continues on heparin s/p cath. Heparin level 0.63 (up from 0.31). No bleeding noted. CBC ok. Pt was subtherapeutic at 10 units/hr and supratherapeutic at 12 units/hr. Currently at 11 units/hr. Although patient is currently therapeutic, the level may continue to rise.   Goal of Therapy:  Heparin level 0.3-0.7 units/ml   Plan:  1. Will check confirmatory heparin level at 1300 today and f/u with heparin dosing.   Thank you,  Brett Fairy, PharmD Pager: [phone]  12/16/2011 8:23 AM   No Known Allergies  Patient Measurements: Height: 5\' 8"  (172.7 cm) Weight: 135 lb 9.3 oz (61.5 kg) IBW/kg (Calculated) : 68.4    Vital Signs: Temp: 99.2 F (37.3 C) (01/05 0755) Temp src: Oral (01/05 0755) BP: 154/68 mmHg (01/05 0800) Pulse Rate: 72  (01/05 0800)  Labs:  Basename 12/16/11 0600 12/15/11 1524 12/15/11 0947 12/15/11 0515 12/14/11 1850 12/14/11 0400 12/13/11 1800 12/13/11 0849  HGB -- 13.2 -- -- -- 13.9 -- --  HCT -- 38.8* -- -- -- 41.6 -- --  PLT -- 160 -- -- -- 158 -- --  APTT -- -- -- -- -- -- -- --  LABPROT -- -- -- -- -- 15.9* -- 14.8  INR -- -- -- -- -- 1.24 -- 1.14  HEPARINUNFRC 0.63 -- 0.31 -- 1.10* -- -- --  CREATININE -- -- -- 2.43* -- 2.33* -- --  CKTOTAL -- -- -- -- -- -- 1098* --  CKMB -- -- -- -- -- -- 65.4* --  TROPONINI -- -- -- -- -- -- >25.00* --   Estimated Creatinine Clearance: 20 ml/min (by C-G formula based on Cr of 2.43).   Medications:  Scheduled:    . albuterol  2.5 mg Nebulization Q6H  . antiseptic oral rinse  15 mL Mouth Rinse QID  . chlorhexidine  15 mL Mouth Rinse BID  . feeding supplement (PROMOTE)  25-55 mL/hr Per Tube Q18H  . furosemide  40 mg Intravenous Once  . furosemide  80 mg Intravenous BID  . pantoprazole (PROTONIX) IV  40 mg Intravenous Daily  . simvastatin  10 mg Per Tube QHS  .  sodium chloride  3 mL Intravenous Q12H  . sodium chloride  3 mL Intravenous Q12H  . Ticagrelor  90 mg Oral BID  . timolol  1 drop Both Eyes BID  . DISCONTD: midazolam  2 mg Intravenous Once   Infusions:    . sodium chloride 12 mL/hr at 12/14/11 1800  . heparin 1,100 Units/hr (12/15/11 2131)  . DISCONTD: fentaNYL infusion INTRAVENOUS 25 mcg/hr (12/14/11 1800)  . DISCONTD: furosemide (LASIX) infusion 8 mg/hr (12/14/11 1800)  . DISCONTD: heparin 1,000 Units/hr (12/14/11 1940)  . DISCONTD: midazolam (VERSED) infusion 1 mg/hr (12/14/11 1829)   PRN: sodium chloride, fentaNYL, sodium chloride, sodium chloride, DISCONTD: acetaminophen, DISCONTD: ondansetron (ZOFRAN) IV   Amarien Carne N 12/16/2011,8:10 AM

## 2011-12-17 ENCOUNTER — Inpatient Hospital Stay (HOSPITAL_COMMUNITY): Payer: Medicare Other

## 2011-12-17 DIAGNOSIS — E876 Hypokalemia: Secondary | ICD-10-CM

## 2011-12-17 LAB — CBC
HCT: 33.7 % — ABNORMAL LOW (ref 39.0–52.0)
Hemoglobin: 11.4 g/dL — ABNORMAL LOW (ref 13.0–17.0)
RDW: 12.8 % (ref 11.5–15.5)
WBC: 6.1 10*3/uL (ref 4.0–10.5)

## 2011-12-17 LAB — GLUCOSE, CAPILLARY
Glucose-Capillary: 147 mg/dL — ABNORMAL HIGH (ref 70–99)
Glucose-Capillary: 174 mg/dL — ABNORMAL HIGH (ref 70–99)
Glucose-Capillary: 160 mg/dL — ABNORMAL HIGH (ref 70–99)

## 2011-12-17 LAB — BASIC METABOLIC PANEL
CO2: 33 mEq/L — ABNORMAL HIGH (ref 19–32)
Chloride: 99 mEq/L (ref 96–112)
GFR calc Af Amer: 27 mL/min — ABNORMAL LOW (ref >90)
Sodium: 143 mEq/L (ref 135–145)

## 2011-12-17 LAB — HEPARIN LEVEL (UNFRACTIONATED): Heparin Unfractionated: 0.28 IU/mL — ABNORMAL LOW (ref 0.30–0.70)

## 2011-12-17 MED ORDER — FUROSEMIDE 10 MG/ML IJ SOLN
20.0000 mg | Freq: Once | INTRAMUSCULAR | Status: AC
  Administered 2011-12-18: 20 mg via INTRAVENOUS
  Filled 2011-12-17: qty 2

## 2011-12-17 MED ORDER — POTASSIUM CHLORIDE 20 MEQ PO PACK: 40.0000 meq | PACK | Freq: Once | ORAL | Status: DC

## 2011-12-17 MED ORDER — POTASSIUM CHLORIDE CRYS ER 20 MEQ PO TBCR
EXTENDED_RELEASE_TABLET | ORAL | Status: AC
  Filled 2011-12-17: qty 2

## 2011-12-17 MED ORDER — POTASSIUM CHLORIDE CRYS ER 20 MEQ PO TBCR
40.0000 meq | EXTENDED_RELEASE_TABLET | ORAL | Status: AC
  Administered 2011-12-17 (×2): 40 meq via ORAL
  Filled 2011-12-17: qty 2

## 2011-12-17 MED ORDER — ASPIRIN 81 MG PO CHEW
81.0000 mg | CHEWABLE_TABLET | Freq: Every day | ORAL | Status: DC
  Administered 2011-12-17 – 2011-12-23 (×7): 81 mg via ORAL
  Filled 2011-12-17 (×7): qty 1

## 2011-12-17 MED ORDER — CARVEDILOL 6.25 MG PO TABS
6.2500 mg | ORAL_TABLET | Freq: Two times a day (BID) | ORAL | Status: DC
  Administered 2011-12-17 – 2011-12-18 (×2): 6.25 mg via ORAL
  Filled 2011-12-17 (×4): qty 1

## 2011-12-17 NOTE — Progress Notes (Signed)
Name: [name] [name] MRN: [id number] DOB: 06-13-1926    LOS: 4  PCCM  CONSULT NOTE  History of Present Illness: Elderly M with CAD s/p cath attempt this am with subsequent acute pulm edema.  PCCM asked to help with care . Pt intubated and CVL placed.  Pt with STEMI on admit  Lines / Drains: ETT 12/12/10>>>15 CVL [place]  12/12/10>>>   Cultures: None Recent Results (from the past 240 hour(s))  MRSA PCR SCREENING     Status: Normal   Collection Time   12/13/11  8:49 AM      Component Value Range Status Comment   MRSA by PCR NEGATIVE  NEGATIVE  Final      Antibiotics: Anti-infectives    None      Tests / Events: 1/2>>cath attempt no PCI done   INTERVAL HX and OVERNIGHT:  1/5> extubated yesteday, good sats overnight.    Vital Signs: Temp:  [97.8 F (36.6 C)-98.7 F (37.1 C)] 98.2 F (36.8 C) (01/06 0740) Pulse Rate:  [60-81] 69  (01/06 1000) Resp:  [15-28] 28  (01/06 1000) BP: (92-163)/(31-87) 119/45 mmHg (01/06 1000) SpO2:  [91 %-99 %] 95 % (01/06 1000) Weight:  [61.6 kg (135 lb 12.9 oz)] 135 lb 12.9 oz (61.6 kg) (01/06 0500) I/O last 3 completed shifts: In: 1282 [P.O.:540; I.V.:738; IV Piggyback:4] Out: 2780 [Urine:2780]  Physical Examination: General:  NAD  Neuro:  A/O x 3  HEENT:  JVD noted.  Neck:  supple   Cardiovascular:  RRR nl s1 s2 no s3/s4 , no edema,  Lungs: decreased BS in bases  Abdomen:  Soft NT BSA Musculoskeletal: nm Skin:  Clear   Ventilator settings:    Labs and Imaging:  Dg Chest Port 1 View  12/17/2011  *RADIOLOGY REPORT*  Clinical Data: Follow up edema  PORTABLE CHEST - 1 VIEW  Comparison: 12/16/2011  Findings: Interval extubation.  Patchy bilateral airspace opacities, possibly edema.  Left lower lobe opacity, likely a combination of atelectasis and effusion. No pneumothorax.  The heart is top normal in size.  Stable right subclavian venous catheter.  IMPRESSION: Interval extubation.  Patchy bilateral airspace opacities, possibly edema.   Left lower lobe opacity, likely a combination of atelectasis and effusion.  Original Report Authenticated By: Charline Bills, M.D.   Dg Chest Port 1 View  12/16/2011  *RADIOLOGY REPORT*  Clinical Data: Pulmonary edema, intubated  PORTABLE CHEST - 1 VIEW  Comparison: 12/15/2011  Findings: Endotracheal tube terminates 5 cm above the carina. Stable right subclavian venous catheter.  Mild cardiomegaly with moderate interstitial edema and suspected bilateral pleural effusions.  No pneumothorax.  IMPRESSION: Endotracheal tube terminates 5 cm above the carina.  Mild cardiomegaly with moderate interstitial edema and suspected bilateral pleural effusions.  Original Report Authenticated By: Charline Bills, M.D.   Dg Chest Port 1 View  12/15/2011  *RADIOLOGY REPORT*  Clinical Data: ETT  PORTABLE CHEST - 1 VIEW  Comparison: 12/14/2011  Findings: Endotracheal tube terminates 5 cm above the carina.  Moderate interstitial edema.  Suspected bilateral pleural effusions.  No pneumothorax.  The heart is top normal in size.  Stable right subclavian venous catheter.  Degenerative changes of the visualized thoracolumbar spine.  IMPRESSION: Endotracheal tube terminates 5 cm above the carina.  Moderate interstitial edema with suspected bilateral pleural effusions.  Original Report Authenticated By: Charline Bills, M.D.     [facility]     Component Value Date/Time   NA 143 12/17/2011 0610   K 2.5* 12/17/2011 4098  CL 99 12/17/2011 0610   CO2 33* 12/17/2011 0610   GLUCOSE 150* 12/17/2011 0610   BUN 55* 12/17/2011 0610   CREATININE 2.39* 12/17/2011 0610   CALCIUM 8.9 12/17/2011 0610   PROT 6.0 12/13/2011 0252   ALBUMIN 3.1* 12/13/2011 0252   AST 17 12/13/2011 0252   ALT 13 12/13/2011 0252   ALKPHOS 57 12/13/2011 0252   BILITOT 0.4 12/13/2011 0252   GFRNONAA 24* 12/17/2011 0610   GFRAA 27* 12/17/2011 0610   CBC    Component Value Date/Time   WBC 6.1 12/17/2011 0610   RBC 3.54* 12/17/2011 0610   HGB 11.4* 12/17/2011 0610   HCT 33.7* 12/17/2011 0610     PLT 144* 12/17/2011 0610   MCV 95.2 12/17/2011 0610   MCH 32.2 12/17/2011 0610   MCHC 33.8 12/17/2011 0610   RDW 12.8 12/17/2011 0610    Recent Labs  Basename 12/17/11 0610 12/15/11 1524   HGB 11.4* 13.2  ]  Recent Labs  Basename 12/17/11 0610 12/16/11 0600 12/15/11 0515   BUN 55* 47* 41*  ]  Assessment and Plan: 1. Acute pulmonary edema and acute resp failure d/t CAD and STEMI  - on 1/612: neg bal, good UOP  -extubated 1/5  -xray cont w/ Patchy bilateral airspace opacities, possibly edema. Left lower lobe opacity, likely a combination of atelectasis and effusion. No fever or bump in wbc   Plan Mobilize pt.  Hold on abx  Cont on diuresis per cards  2. CKD    Lab 12/17/11 0610 12/16/11 0600 12/15/11 0515 12/14/11 0400 12/13/11 0310  CREATININE 2.39* 2.45* 2.43* 2.33* 1.70*   1/6>scr tr up with diuresis Lasix decreased per cards  bmet in am   3. Anemia  Lab 12/17/11 0610 12/15/11 1524 12/14/11 0400  HGB 11.4* 13.2 13.9  HCT 33.7* 38.8* 41.6  WBC 6.1 10.4 11.0*  PLT 144* 160 158   Monitor. PRBC for hgb < 8gm%  4. HTN, hold BP meds for now   5. CAD/STEMI -on hep drip  Per Cards   Best practices / Disposition: -->ICU status -->full code -->SCD  for DVT Px aliong with cardiac anticoagulant -->Protonix for GI Px  -->family wife /family updated at bedside   >pulmonary will sign off, contact As needed     Rubye Oaks NP-C  [facility]  [phone]  12/17/2011  Patient seen and examined and database was reviewed with ACNP. The above note reflects the plan as established on morning rounds.  Billy Fischer, MD;  PCCM service; Mobile [phone]

## 2011-12-17 NOTE — Progress Notes (Addendum)
ANTICOAGULATION CONSULT NOTE - Follow Up Consult  Pharmacy Consult for Heparin Indication: NSTEMI s/p cath w/ multi-vessel dz   Assessment:  41 YOM continues on heparin s/p cath. Heparin level 0.28, down from 0.72 yesterday with only a 50 unit decrease in dose. Noted large drop in H/H/Plts. Spoke with nurse, pt not bleeding from any sites and pt not complaining of stomach pain. No issues with infusion.   Goal of Therapy:  Heparin level 0.3-0.7 units/ml   Plan:  1. Increase heparin to1100 units/hr (69ml/hr)  2. F/u 8 hr heparin level  3. ++++Follow up CBC++++  Thank you,  Brett Fairy, PharmD Pager: [phone]  12/17/2011 8:07 AM   No Known Allergies  Patient Measurements: Height: 5\' 8"  (172.7 cm) Weight: 135 lb 12.9 oz (61.6 kg) IBW/kg (Calculated) : 68.4   Vital Signs: Temp: 98.2 F (36.8 C) (01/06 0740) Temp src: Oral (01/06 0740) BP: 131/48 mmHg (01/06 0700) Pulse Rate: 61  (01/06 0700)  Labs:  Basename 12/17/11 0610 12/16/11 1158 12/16/11 0600 12/15/11 1524 12/15/11 0515  HGB 11.4* -- -- 13.2 --  HCT 33.7* -- -- 38.8* --  PLT 144* -- -- 160 --  APTT -- -- -- -- --  LABPROT -- -- -- -- --  INR -- -- -- -- --  HEPARINUNFRC 0.28* 0.72* 0.63 -- --  CREATININE 2.39* -- 2.45* -- 2.43*  CKTOTAL -- -- -- -- --  CKMB -- -- -- -- --  TROPONINI -- -- -- -- --   Estimated Creatinine Clearance: 20.4 ml/min (by C-G formula based on Cr of 2.39).   Medications:  Scheduled:    . albuterol  2.5 mg Nebulization Q6H  . carvedilol  3.125 mg Oral BID WC  . feeding supplement (PROMOTE)  25-55 mL/hr Per Tube Q18H  . furosemide  40 mg Intravenous BID  . pantoprazole  40 mg Oral Q1200  . potassium chloride SA      . potassium chloride  40 mEq Oral Q2H  . simvastatin  10 mg Per Tube QHS  . sodium chloride  3 mL Intravenous Q12H  . sodium chloride  3 mL Intravenous Q12H  . Ticagrelor  90 mg Oral BID  . timolol  1 drop Both Eyes BID  . DISCONTD: antiseptic oral rinse  15 mL  Mouth Rinse QID  . DISCONTD: chlorhexidine  15 mL Mouth Rinse BID  . DISCONTD: furosemide  80 mg Intravenous BID  . DISCONTD: pantoprazole (PROTONIX) IV  40 mg Intravenous Daily  . DISCONTD: potassium chloride  40 mEq Oral Once  . DISCONTD: potassium chloride  40 mEq Oral Once   Infusions:    . sodium chloride 12 mL/hr at 12/14/11 1800  . heparin 1,050 Units/hr (12/16/11 1615)   PRN: sodium chloride, sodium chloride, sodium chloride, DISCONTD: fentaNYL  Juliann Pulse 12/17/2011,8:00 AM  Addendum:   RN Myriam Jacobson called to notify Rx that pt had very minor bleeding from nose, now resolved.  Will continue to monitor.  Kabe Mckoy L. Illene Bolus, PharmD, BCPS Clinical Pharmacist Pager: [phone] 12/17/2011 5:48 PM

## 2011-12-17 NOTE — Progress Notes (Signed)
   Patient Name: [name] [name]      SUBJECTIVE: Much improved this morning. He has no chest pain or shortness of breath.  Past Medical History  Diagnosis Date  . HYPERTENSION, HEART UNCONTROLLED W/O ASSOC CHF 02/03/2009  . Stroke 10.2.01  . History of colon cancer   . Heart murmur   . Hypertension     PHYSICAL EXAM Filed Vitals:   12/17/11 0600 12/17/11 0700 12/17/11 0740 12/17/11 0755  BP: 131/46 131/48    Pulse: 60 61    Temp:   98.2 F (36.8 C)   TempSrc:   Oral   Resp: 25 24    Height:      Weight:      SpO2: 93% 97%  98%    Well developed and nourished in no acute distress HENT normal Neck supple with JVP-7-8 Carotids brisk and full   Clear Regular rate and rhythm, no murmurs or gallops Abd-soft with active BS without hepatomegaly No Clubbing cyanosis edema Skin-warm and dry A & Oriented  Grossly normal sensory and motor function   TELEMETRY: Reviewed telemetry pt in sinus :    Intake/Output Summary (Last 24 hours) at 12/17/11 0813 Last data filed at 12/17/11 0700  Gross per 24 hour  Intake   1009 ml  Output    840 ml  Net    169 ml    LABS: Basic Metabolic Panel:  Lab 12/17/11 1610 12/16/11 0600 12/15/11 0515 12/14/11 0400 12/13/11 0310 12/13/11 0252  NA 143 144 139 138 140 137  K 2.5* 3.2* 3.6 4.8 4.2 4.1  CL 99 99 101 102 105 102  CO2 33* 30 29 26  -- 26  GLUCOSE 150* 153* 150* 191* 182* 180*  BUN 55* 47* 41* 31* 20 20  CREATININE 2.39* 2.45* 2.43* 2.33* 1.70* 1.79*  CALCIUM 8.9 9.3 -- -- -- --  MG -- -- -- -- -- --  PHOS -- -- -- -- -- --   CBC:  Lab 12/17/11 0610 12/15/11 1524 12/14/11 0400 12/13/11 0310 12/13/11 0252  WBC 6.1 10.4 11.0* -- 6.5  NEUTROABS -- -- -- -- --  HGB 11.4* 13.2 13.9 11.9* 11.6*  HCT 33.7* 38.8* 41.6 35.0* 34.5*  MCV 95.2 95.6 96.7 -- 96.1  PLT 144* 160 158 -- 124*     ASSESSMENT AND PLAN: Patient is hemodynamically much improved. I appear to be guilty of a little bit overzealous diuresis. We will  back off on his Lasix today. We'll replete his potassium. Cr starting down even with BUN going up  Anticipate reviewing with interventional colleagues tomorrow a plan for revascularization.  Will increase carvedilol a little bit today.  Continue aspirin heparin and ticagrelor  Patient Active Hospital Problem List: NSTEMI (non-ST elevated myocardial infarction) (12/13/2011)   Acute systolic heart failure (12/14/2011)   Acute on chronic renal failure (12/13/2011)   Anemia (12/13/2011)   Hypokalemia (12/17/2011)      Signed, Sherryl Manges MD  12/17/2011

## 2011-12-17 NOTE — Progress Notes (Signed)
Pt had very small nose bleed--pharmacy notified.

## 2011-12-17 NOTE — Progress Notes (Signed)
crCRITICAL VALUE ALERT  Critical value received:  K+ 2.5  Date of notification:  12/17/11  Time of notification:  0800  Critical value read back:yes  Nurse who received alert:  Cephus Shelling RN  MD notified (1st page):  Graciela Husbands  Time of first page: 0802  MD notified (2nd page):  Time of second page:  Responding MD: Graciela Husbands  Time MD responded: [phone]

## 2011-12-17 NOTE — Progress Notes (Addendum)
ANTICOAGULATION CONSULT NOTE - Follow Up Consult  Pharmacy Consult for UFH Indication: NSTEMI  No Known Allergies  Patient Measurements: Height: 5\' 8"  (172.7 cm) Weight: 135 lb 12.9 oz (61.6 kg) IBW/kg (Calculated) : 68.4   Vital Signs: Temp: 100.1 F (37.8 C) (01/06 1600) Temp src: Oral (01/06 1600) BP: 138/61 mmHg (01/06 1800) Pulse Rate: 80  (01/06 1800)  Labs:  Basename 12/17/11 1647 12/17/11 0610 12/16/11 1158 12/16/11 0600 12/15/11 1524 12/15/11 0515  HGB -- 11.4* -- -- 13.2 --  HCT -- 33.7* -- -- 38.8* --  PLT -- 144* -- -- 160 --  APTT -- -- -- -- -- --  LABPROT -- -- -- -- -- --  INR -- -- -- -- -- --  HEPARINUNFRC 0.33 0.28* 0.72* -- -- --  CREATININE -- 2.39* -- 2.45* -- 2.43*  CKTOTAL -- -- -- -- -- --  CKMB -- -- -- -- -- --  TROPONINI -- -- -- -- -- --   Estimated Creatinine Clearance: 20.4 ml/min (by C-G formula based on Cr of 2.39).   Medications:  Scheduled:    . albuterol  2.5 mg Nebulization Q6H  . carvedilol  6.25 mg Oral BID WC  . feeding supplement (PROMOTE)  25-55 mL/hr Per Tube Q18H  . furosemide  20 mg Intravenous Once  . pantoprazole  40 mg Oral Q1200  . potassium chloride SA      . potassium chloride  40 mEq Oral Q2H  . simvastatin  10 mg Per Tube QHS  . sodium chloride  3 mL Intravenous Q12H  . sodium chloride  3 mL Intravenous Q12H  . Ticagrelor  90 mg Oral BID  . timolol  1 drop Both Eyes BID  . DISCONTD: carvedilol  3.125 mg Oral BID WC  . DISCONTD: furosemide  40 mg Intravenous BID  . DISCONTD: potassium chloride  40 mEq Oral Once  . DISCONTD: potassium chloride  40 mEq Oral Once    Assessment: 79 y/o male patient admitted with NSTEMI, receiving IV heparin for anticoagulation. Heparin level therapeutic on 1100 units/hr. Noted epistaxis this evening. Will keep heparin on lower side of goal.  Goal of Therapy:  Heparin level 0.3-0.7 units/ml   Plan:  Continue heparin gtt at 1100 units/hr and f/u in am.  Verlene Mayer, PharmD, BCPS Pager [phone] 12/17/2011,6:17 PM   Patient has not received aspirin since 1.3, was d/c'd. Will resume this pm.

## 2011-12-18 DIAGNOSIS — I219 Acute myocardial infarction, unspecified: Secondary | ICD-10-CM

## 2011-12-18 DIAGNOSIS — N179 Acute kidney failure, unspecified: Secondary | ICD-10-CM

## 2011-12-18 DIAGNOSIS — I5021 Acute systolic (congestive) heart failure: Secondary | ICD-10-CM

## 2011-12-18 DIAGNOSIS — I359 Nonrheumatic aortic valve disorder, unspecified: Secondary | ICD-10-CM

## 2011-12-18 LAB — HEPARIN LEVEL (UNFRACTIONATED): Heparin Unfractionated: 0.27 IU/mL — ABNORMAL LOW (ref 0.30–0.70)

## 2011-12-18 LAB — GLUCOSE, CAPILLARY: Glucose-Capillary: 139 mg/dL — ABNORMAL HIGH (ref 70–99)

## 2011-12-18 LAB — BASIC METABOLIC PANEL
CO2: 30 mEq/L (ref 19–32)
Calcium: 9.1 mg/dL (ref 8.4–10.5)
Chloride: 97 mEq/L (ref 96–112)
Glucose, Bld: 149 mg/dL — ABNORMAL HIGH (ref 70–99)
Sodium: 137 mEq/L (ref 135–145)

## 2011-12-18 MED ORDER — CARVEDILOL 12.5 MG PO TABS
12.5000 mg | ORAL_TABLET | Freq: Two times a day (BID) | ORAL | Status: DC
  Administered 2011-12-18 – 2011-12-19 (×3): 12.5 mg via ORAL
  Filled 2011-12-18 (×6): qty 1

## 2011-12-18 MED ORDER — HEPARIN SODIUM (PORCINE) 5000 UNIT/ML IJ SOLN
5000.0000 [IU] | Freq: Three times a day (TID) | INTRAMUSCULAR | Status: DC
  Administered 2011-12-18 – 2011-12-20 (×8): 5000 [IU] via SUBCUTANEOUS
  Filled 2011-12-18 (×14): qty 1

## 2011-12-18 MED ORDER — ALBUTEROL SULFATE (5 MG/ML) 0.5% IN NEBU: 2.5000 mg | INHALATION_SOLUTION | RESPIRATORY_TRACT | Status: DC | PRN

## 2011-12-18 NOTE — Progress Notes (Signed)
Inpatient Diabetes Program Recommendations  AACE/ADA: New Consensus Statement on Inpatient Glycemic Control (2009)  Target Ranges:  Prepandial:   less than 140 mg/dL      Peak postprandial:   less than 180 mg/dL (1-2 hours)      Critically ill patients:  140 - 180 mg/dL    Inpatient Diabetes Program Recommendations Correction (SSI): Start senstiive scale TID

## 2011-12-18 NOTE — Progress Notes (Signed)
CARDIAC REHAB PHASE I   PRE:  Rate/Rhythm: 65SR  BP:  Supine:   Sitting: 144/66  Standing:    SaO2: 98%RA  MODE:  Ambulation: 150 ft   POST:  Rate/Rhythem: 74SR  BP:  Supine:   Sitting: 159/68  Standing:    SaO2: 98%RA 1450-1515 Pt walked 150 ft on RA with rolling walker and asst x 2. Encouraged pt to stand close to walker and upright. Denied CP. To chair with call light. Tolerated well.  Duanne Limerick

## 2011-12-18 NOTE — Progress Notes (Signed)
Patient Name: [name] [name]      SUBJECTIVE: Much improved this morning. He has no chest pain or shortness of breath. States breathing significantly better since he coughed up some secretions and had a BM. Still feels quite weak.   Past Medical History  Diagnosis Date  . HYPERTENSION, HEART UNCONTROLLED W/O ASSOC CHF 02/03/2009  . Stroke 10.2.01  . History of colon cancer   . Heart murmur   . Hypertension     PHYSICAL EXAM Filed Vitals:   12/18/11 0400 12/18/11 0500 12/18/11 0600 12/18/11 0700  BP: 130/56 154/71 144/55 143/55  Pulse: 63 68 64 63  Temp: 97.5 F (36.4 C)     TempSrc: Oral     Resp: 27 20 16 17   Height:      Weight:  63.1 kg (139 lb 1.8 oz)    SpO2: 96% 97% 93% 94%    Well developed and nourished in no acute distress HENT normal Neck supple with JVP 6cm Carotids brisk and full   Clear Regular rate and rhythm, harsh grade 2-3/6 systolic murmur RUSB. Abd-soft with active BS without hepatomegaly No Clubbing cyanosis edema, a lot of bruising on arms. Skin-warm and dry A & Oriented  Grossly normal sensory and motor function   TELEMETRY: Reviewed telemetry pt in sinus :    Intake/Output Summary (Last 24 hours) at 12/18/11 0759 Last data filed at 12/18/11 0700  Gross per 24 hour  Intake 1713.5 ml  Output   1055 ml  Net  658.5 ml    LABS: Basic Metabolic Panel:  Lab 12/18/11 5409 12/17/11 0610 12/16/11 0600 12/15/11 0515 12/14/11 0400 12/13/11 0310 12/13/11 0252  NA 137 143 144 139 138 140 137  K 3.8 2.5* 3.2* 3.6 4.8 4.2 4.1  CL 97 99 99 101 102 105 102  CO2 30 33* 30 29 26  -- 26  GLUCOSE 149* 150* 153* 150* 191* 182* 180*  BUN 49* 55* 47* 41* 31* 20 20  CREATININE 2.09* 2.39* 2.45* 2.43* 2.33* 1.70* 1.79*  CALCIUM 9.1 8.9 -- -- -- -- --  MG -- -- -- -- -- -- --  PHOS -- -- -- -- -- -- --   CBC:  Lab 12/17/11 0610 12/15/11 1524 12/14/11 0400 12/13/11 0310 12/13/11 0252  WBC 6.1 10.4 11.0* -- 6.5  NEUTROABS -- -- -- -- --  HGB  11.4* 13.2 13.9 11.9* 11.6*  HCT 33.7* 38.8* 41.6 35.0* 34.5*  MCV 95.2 95.6 96.7 -- 96.1  PLT 144* 160 158 -- 124*     ASSESSMENT AND PLAN: 1. NSTEMI secondary to ostial LCX occlusion. No recurrent angina.  2. CAD cath films reviewed today. L main 50% ostial, Diffuse moderate LAD disease, 100% ostial LCX, 80% ostial RCA.  3. Moderate to severe aortic stenosis by Echo. Mean gradient 16 mmHg, peak 29 mmHg, AVA of 1.1 cm2. By cath AV gradient 14 mmHg.  4. Acute on chronic renal insufficiency. Improving with decrease diuretic dose.  5. CHF with acute systolic failure and pulmonary edema. Clinically improved. Still needs diuresis. EF 25-30 %.d  6. Anemia. Mild.  7. Hypokalemia. Corrected.  8. Deconditioning.  Plan: Will move to stepdown. Progress activity with cardiac Rehab. Stop IV heparin. Will change to SQ for DVT prophylaxis. Change Lasix to po. Continue to hold ACEi due to renal insufficiency. Increase Coreg. Add nitrates. Will need to consider Surgical consult for CABG/AVR. Clearly now patient is at high risk and I think we need him to recover from this  acute episode before we make a decision but I will ask CV surgery to see at some point.   Patient Active Hospital Problem List: NSTEMI (non-ST elevated myocardial infarction) (12/13/2011)   Acute systolic heart failure (12/14/2011)   Acute on chronic renal failure (12/13/2011)   Anemia (12/13/2011)   Hypokalemia (12/17/2011)      Signed, Peter [place] MD, [facility]  12/18/2011

## 2011-12-19 LAB — BASIC METABOLIC PANEL
BUN: 45 mg/dL — ABNORMAL HIGH (ref 6–23)
Creatinine, Ser: 1.95 mg/dL — ABNORMAL HIGH (ref 0.50–1.35)
GFR calc Af Amer: 35 mL/min — ABNORMAL LOW (ref >90)
GFR calc non Af Amer: 30 mL/min — ABNORMAL LOW (ref >90)
Glucose, Bld: 136 mg/dL — ABNORMAL HIGH (ref 70–99)
Potassium: 3.3 mEq/L — ABNORMAL LOW (ref 3.5–5.1)

## 2011-12-19 MED ORDER — ISOSORBIDE MONONITRATE ER 30 MG PO TB24
30.0000 mg | ORAL_TABLET | Freq: Every day | ORAL | Status: DC
  Administered 2011-12-19 – 2011-12-20 (×2): 30 mg via ORAL
  Filled 2011-12-19 (×3): qty 1

## 2011-12-19 MED ORDER — POTASSIUM CHLORIDE CRYS ER 20 MEQ PO TBCR
40.0000 meq | EXTENDED_RELEASE_TABLET | Freq: Once | ORAL | Status: AC
  Administered 2011-12-19: 40 meq via ORAL
  Filled 2011-12-19: qty 2

## 2011-12-19 MED ORDER — ALBUTEROL SULFATE HFA 108 (90 BASE) MCG/ACT IN AERS
2.0000 | INHALATION_SPRAY | RESPIRATORY_TRACT | Status: DC | PRN
  Filled 2011-12-19: qty 6.7

## 2011-12-19 MED ORDER — SIMVASTATIN 10 MG PO TABS
10.0000 mg | ORAL_TABLET | Freq: Every day | ORAL | Status: DC
  Administered 2011-12-19 – 2011-12-22 (×4): 10 mg via ORAL
  Filled 2011-12-19 (×5): qty 1

## 2011-12-19 MED ORDER — FUROSEMIDE 40 MG PO TABS
40.0000 mg | ORAL_TABLET | Freq: Two times a day (BID) | ORAL | Status: DC
  Administered 2011-12-19 – 2011-12-22 (×6): 40 mg via ORAL
  Filled 2011-12-19 (×9): qty 1

## 2011-12-19 NOTE — Progress Notes (Signed)
CARDIAC REHAB PHASE I   PRE:  Rate/Rhythm: 62 SR    BP: sitting 111/55    SaO2: 94 RA  MODE:  Ambulation: 250 ft   POST:  Rate/Rhythm: 76 SR with PVCs    BP: sitting 125/62     SaO2: 97 RA  Tolerated well assist x2 with RW. Bent forward over RW. No c/o. No CP, to chair. [phone]   [name] CES, ACSM

## 2011-12-19 NOTE — Progress Notes (Signed)
Cardiology Progress Note Patient Name: [name] [name] Date of Encounter: 12/19/2011, 8:22 AM     Subjective  No overnight events. Patient was able to walk with cardiac rehab yesterday without shortness of breath or chest pain. Patient feels well this morning without any complaints.   Objective   Telemetry: Sinus rhythm 60s-70s  Medications: . aspirin  81 mg Oral Daily  . carvedilol  12.5 mg Oral BID WC  . heparin  5,000 Units Subcutaneous Q8H  . pantoprazole  40 mg Oral Q1200  . simvastatin  10 mg Per Tube QHS  . Ticagrelor  90 mg Oral BID  . timolol  1 drop Both Eyes BID    Physical Exam: Temp:  [97.4 F (36.3 C)-99.7 F (37.6 C)] 98.9 F (37.2 C) (01/08 0744) Pulse Rate:  [63-73] 67  (01/07 1713) Resp:  [16-18] 18  (01/08 0744) BP: (122-159)/(55-96) 139/55 mmHg (01/08 0342) SpO2:  [91 %-96 %] 92 % (01/08 0744) Weight:  [138 lb 3.7 oz (62.7 kg)] 138 lb 3.7 oz (62.7 kg) (01/08 0500)  General: Elderly white male, in no acute distress. Head: Normocephalic, atraumatic, sclera non-icteric, nares are without discharge.  Neck: Supple. Negative for carotid bruits or JVD Lungs: Fine bibasilar rales. No wheezes or rhonchi. Breathing is unlabored. Heart: RRR S1 S2.  harsh grade 2-3/6 systolic murmur RUSB. No rubs or gallops.  Abdomen: Soft, non-tender, non-distended with normoactive bowel sounds. No rebound/guarding. No obvious abdominal masses. Msk:  Strength and tone appear normal for age. Extremities: No edema. No clubbing or cyanosis. Distal pedal pulses are 2+ and equal bilaterally. Neuro: Alert and oriented X 3. Moves all extremities spontaneously. Psych:  Responds to questions appropriately with a normal affect.  Intake/Output Summary (Last 24 hours) at 12/19/11 0822 Last data filed at 12/19/11 0500  Gross per 24 hour  Intake    711 ml  Output   1050 ml  Net   -339 ml   Labs:  [facility] 12/19/11 0645 12/18/11 0629  NA 137 137  K 3.3* 3.8  CL 99 97  CO2 28 30   GLUCOSE 136* 149*  BUN 45* 49*  CREATININE 1.95* 2.09*  CALCIUM 8.9 9.1   Basename 12/17/11 0610  WBC 6.1  HGB 11.4*  HCT 33.7*  MCV 95.2  PLT 144*     12/13/2011 07:45 12/13/2011 18:00  CK, MB 26.6 (HH) 65.4 (HH)  CK Total 449 (H) 1098 (H)  Troponin I 13.96 (HH) >25.00 (HH)     12/14/2011 04:00  Cholesterol 124  Triglycerides 69  HDL 47  LDL (calc) 63  VLDL 14  Total CHOL/HDL Ratio 2.6   Radiology/Studies:   12/13/11 - 2D Echocardiogram Study Conclusions - Left ventricle: The cavity size was normal. Wall thickness was normal. Systolic function was severely reduced. The estimated ejection fraction was in the range of 25% to 30%. Diffuse hypokinesis. There is akinesis of the posterolateral myocardium. Features are consistent with a pseudonormal left ventricular filling pattern, with concomitant abnormal relaxation and increased filling pressure (grade 2 diastolic dysfunction). - Aortic valve: Valve mobility was restricted. There was moderate to severe stenosis. Valve area: 1.03cm^2(VTI). Valve area: 1.11cm^2 (Vmax). - Mitral valve: Calcified annulus. Mild to moderate regurgitation. - Left atrium: The atrium was moderately dilated.  12/13/11 - Left Heart Cath, Selective Coronary Angiography Hemodynamics:  AO 148/82 with a mean of 111 mmHg  LV 162 with an EDP of 42 mmHg  Coronary dominance: right  Left mainstem: Mild ostial  disease of 20%.  Left anterior descending (LAD): 50% ostial disease. The mid LAD is diffusely diseased up to 60-70%. The first diagonal branch has 40% disease at the origin and in the mid vessel. The second diagonal branch has 70% disease.  Left circumflex (LCx): Occluded at the ostium. There is minimal left to left collateral.  Right coronary artery (RCA): This is a dominant vessel. There is an 80-90% ostial stenosis.  Left ventriculography: Not performed to avoid increased contrast load.  Final Conclusions:  1. Severe 3 vessel obstructive coronary disease.  2.  Severely elevated left ventricular filling pressures.  Recommendations: Aggressive medical therapy for pulmonary edema. Patient will be transferred to the intensive care unit for IV Lasix, nitroglycerin, and BiPAP therapy.   Dg Chest Port 1 View 12/17/2011 Findings: Interval extubation.  Patchy bilateral airspace opacities, possibly edema.  Left lower lobe opacity, likely a combination of atelectasis and effusion. No pneumothorax.  The heart is top normal in size.  Stable right subclavian venous catheter.  IMPRESSION: Interval extubation.  Patchy bilateral airspace opacities, possibly edema.  Left lower lobe opacity, likely a combination of atelectasis and effusion.    Assessment and Plan  78 y.o. male w/ PMHx significant for Aortic Stenosis, HTN, CKD, and CVA who presented [facility]11 with complaints of chest pain and had new ST depressions on EKG and positive poc troponin.   1. NSTEMI: Secondary to ostial LCx occlusion. No recurrent angina  2. CAD: L main 50% ostial, Diffuse moderate LAD disease, 100% ostial LCX, 80% ostial RCA.   3. Moderate to severe aortic stenosis by Echo. Mean gradient 16 mmHg, peak 29 mmHg, AVA of 1.1 cm2. By cath AV gradient 14 mmHg.   4. Acute on chronic renal insufficiency. BUN/Crt 45/1.95 this am.   5. CHF with acute systolic failure and pulmonary edema. EF 25-30 %. Clinically improved.  6. Anemia. Mild.   7. Hypokalemia. 3.3 this am  8. Deconditioning.   Plan: Awaiting stepdown bed. Was able to ambulate with Cardiac Rehab yesterday without chest pain. BUN/Crt improving. Volume status stable. Continue to hold ACEi due to renal insufficiency. Cont ASA, Coreg,statin, ticagrelor. Add Imdur 30mg . Supplement Potassium.  Check CBC in AM and cont to guaic stools. Will need to consider Surgical consult for CABG/AVR when recovered from this acute episode.    Signed, HOPE, JESSICA PA-C  Patient seen and examined and history reviewed. Agree with  above findings and plan. Patient is making excellent progress. Will transfer to telemetry. Continue to monitor renal function.  Theron Arista [facility] 12/19/2011 11:47 AM

## 2011-12-20 ENCOUNTER — Other Ambulatory Visit: Payer: Self-pay

## 2011-12-20 DIAGNOSIS — I2510 Atherosclerotic heart disease of native coronary artery without angina pectoris: Secondary | ICD-10-CM

## 2011-12-20 LAB — CBC
Hemoglobin: 11.5 g/dL — ABNORMAL LOW (ref 13.0–17.0)
MCH: 31.5 pg (ref 26.0–34.0)
MCHC: 33.5 g/dL (ref 30.0–36.0)
RDW: 12.6 % (ref 11.5–15.5)

## 2011-12-20 LAB — BASIC METABOLIC PANEL
BUN: 46 mg/dL — ABNORMAL HIGH (ref 6–23)
Calcium: 9.2 mg/dL (ref 8.4–10.5)
GFR calc Af Amer: 30 mL/min — ABNORMAL LOW (ref >90)
GFR calc non Af Amer: 26 mL/min — ABNORMAL LOW (ref >90)
Glucose, Bld: 133 mg/dL — ABNORMAL HIGH (ref 70–99)
Potassium: 3.7 mEq/L (ref 3.5–5.1)
Sodium: 139 mEq/L (ref 135–145)

## 2011-12-20 LAB — GLUCOSE, CAPILLARY: Glucose-Capillary: 147 mg/dL — ABNORMAL HIGH (ref 70–99)

## 2011-12-20 MED ORDER — CARVEDILOL 25 MG PO TABS
25.0000 mg | ORAL_TABLET | Freq: Two times a day (BID) | ORAL | Status: DC
  Administered 2011-12-20 – 2011-12-23 (×7): 25 mg via ORAL
  Filled 2011-12-20 (×9): qty 1

## 2011-12-20 NOTE — Progress Notes (Signed)
Cardiology Progress Note Patient Name: [name] [name] Date of Encounter: 12/20/2011, 7:33 AM     Subjective  No overnight events. Patient was able to walk with cardiac rehab yesterday without shortness of breath or chest pain. Patient feels well this morning without any complaints. Didn't sleep well last night.   Objective   Telemetry: Sinus rhythm 60s-70s  Medications: . aspirin  81 mg Oral Daily  . carvedilol  12.5 mg Oral BID WC  . heparin  5,000 Units Subcutaneous Q8H  . pantoprazole  40 mg Oral Q1200  . simvastatin  10 mg Per Tube QHS  . Ticagrelor  90 mg Oral BID  . timolol  1 drop Both Eyes BID    Physical Exam: Temp:  [97.4 F (36.3 C)-98.9 F (37.2 C)] 97.5 F (36.4 C) (01/09 0500) Pulse Rate:  [62-65] 65  (01/09 0500) Resp:  [16-20] 18  (01/09 0500) BP: (117-137)/(61-80) 121/67 mmHg (01/09 0500) SpO2:  [92 %-98 %] 98 % (01/09 0500) Weight:  [63.095 kg (139 lb 1.6 oz)] 63.095 kg (139 lb 1.6 oz) (01/09 0500)  General: Elderly white male, in no acute distress. Head: Normocephalic, atraumatic, sclera non-icteric, nares are without discharge.  Neck: Supple. Negative for carotid bruits or JVD Lungs: Fine bibasilar rales. No wheezes or rhonchi. Breathing is unlabored. Heart: RRR S1 S2.  harsh grade 2-3/6 systolic murmur RUSB. No rubs or gallops.  Abdomen: Soft, non-tender, non-distended with normoactive bowel sounds. No rebound/guarding. No obvious abdominal masses. Msk:  Strength and tone appear normal for age. Extremities: No edema. No clubbing or cyanosis. Distal pedal pulses are 2+ and equal bilaterally. Neuro: Alert and oriented X 3. Moves all extremities spontaneously. Psych:  Responds to questions appropriately with a normal affect.  Intake/Output Summary (Last 24 hours) at 12/19/11 0822 Last data filed at 12/19/11 0500  Gross per 24 hour  Intake    711 ml  Output   1050 ml  Net   -339 ml   Labs:  [facility] 12/19/11 0645 12/18/11 0629  NA 137 137    K 3.3* 3.8  CL 99 97  CO2 28 30  GLUCOSE 136* 149*  BUN 45* 49*  CREATININE 1.95* 2.09*  CALCIUM 8.9 9.1   Basename 12/17/11 0610  WBC 6.1  HGB 11.4*  HCT 33.7*  MCV 95.2  PLT 144*     12/13/2011 07:45 12/13/2011 18:00  CK, MB 26.6 (HH) 65.4 (HH)  CK Total 449 (H) 1098 (H)  Troponin I 13.96 (HH) >25.00 (HH)     12/14/2011 04:00  Cholesterol 124  Triglycerides 69  HDL 47  LDL (calc) 63  VLDL 14  Total CHOL/HDL Ratio 2.6   Radiology/Studies:   12/13/11 - 2D Echocardiogram Study Conclusions - Left ventricle: The cavity size was normal. Wall thickness was normal. Systolic function was severely reduced. The estimated ejection fraction was in the range of 25% to 30%. Diffuse hypokinesis. There is akinesis of the posterolateral myocardium. Features are consistent with a pseudonormal left ventricular filling pattern, with concomitant abnormal relaxation and increased filling pressure (grade 2 diastolic dysfunction). - Aortic valve: Valve mobility was restricted. There was moderate to severe stenosis. Valve area: 1.03cm^2(VTI). Valve area: 1.11cm^2 (Vmax). - Mitral valve: Calcified annulus. Mild to moderate regurgitation. - Left atrium: The atrium was moderately dilated.  12/13/11 - Left Heart Cath, Selective Coronary Angiography Hemodynamics:  AO 148/82 with a mean of 111 mmHg  LV 162 with an EDP of 42 mmHg  Coronary dominance:  right  Left mainstem: Mild ostial disease of 20%.  Left anterior descending (LAD): 50% ostial disease. The mid LAD is diffusely diseased up to 60-70%. The first diagonal branch has 40% disease at the origin and in the mid vessel. The second diagonal branch has 70% disease.  Left circumflex (LCx): Occluded at the ostium. There is minimal left to left collateral.  Right coronary artery (RCA): This is a dominant vessel. There is an 80-90% ostial stenosis.  Left ventriculography: Not performed to avoid increased contrast load.  Final Conclusions:  1. Severe 3  vessel obstructive coronary disease.  2. Severely elevated left ventricular filling pressures.  Recommendations: Aggressive medical therapy for pulmonary edema. Patient will be transferred to the intensive care unit for IV Lasix, nitroglycerin, and BiPAP therapy.   Dg Chest Port 1 View 12/17/2011 Findings: Interval extubation.  Patchy bilateral airspace opacities, possibly edema.  Left lower lobe opacity, likely a combination of atelectasis and effusion. No pneumothorax.  The heart is top normal in size.  Stable right subclavian venous catheter.  IMPRESSION: Interval extubation.  Patchy bilateral airspace opacities, possibly edema.  Left lower lobe opacity, likely a combination of atelectasis and effusion.    Assessment and Plan  78 y.o. male w/ PMHx significant for Aortic Stenosis, HTN, CKD, and CVA who presented to [facility] on 12/13/11 with complaints of chest pain and had new ST depressions on EKG and positive poc troponin.   1. NSTEMI: Secondary to ostial LCx occlusion. No recurrent angina  2. CAD: L main 50% ostial, Diffuse moderate LAD disease, 100% ostial LCX, 80% ostial RCA.   3. Moderate to severe aortic stenosis by Echo. Mean gradient 16 mmHg, peak 29 mmHg, AVA of 1.1 cm2. By cath AV gradient 14 mmHg.   4. Acute on chronic renal insufficiency. BMET pending today.   5. CHF with acute systolic failure and pulmonary edema. EF 25-30 %. Clinically improved.  6. Anemia. Mild.   7. Hypokalemia.   8. Deconditioning.   Plan: Progressing well with cardiac Rehab. Will increase Coreg to 25 mg bid. Hold ACEi due to renal insufficiency.  Will need to consider Surgical consult for CABG/AVR when recovered from this acute episode. Hopefully home next 1-2 days. Will benefit from [facility] +/- PT. Will PT assessment.   Theron Arista [facility] 12/20/2011 7:33 AM

## 2011-12-20 NOTE — Consult Note (Signed)
[address]            [name] [address]          [phone]     Reason for Consult: Severe multivessel coronary disease status post ST segment elevation MI Referring Physician: Dr. Peter Swaziland  [name] [name] is an 78 y.o. male.  HPI:   The patient has a known history of mild to moderate aortic stenosis with left ventricular hypertrophy as well as hypertension, diabetes, and hyperlipidemia. He was in his usual state of health until around midnight on 12/12/2010. While trying to get sleep he developed sudden substernal chest pressure which he never had before. After about 3 hours it was getting worse and he woke his wife up to call 911 and he was brought to [facility] by EMS. He was given an aspirin as well as nitroglycerin and his chest pain resolved completely. It recurred in the emergency room. Echocardiogram showed ST segment depression in leads V4 through V6 and his initial troponin was 0.31. An echocardiogram showed moderate to severe aortic stenosis with an ejection fraction of 25-30%. He subsequently underwent cardiac catheterization which showed the culprit to be a left circumflex occluded at the origin. The patient also has a significant ostial left main stenosis and I would estimate to be in the range of 60%. The LAD has a 50% ostial stenosis in the mid LAD and is diffusely diseased up to about 70%. The right coronary artery is a dominant vessel with 80 and 90% ostial stenosis. The patient went into pulmonary edema post catheterization and required intubation. He gradually improved and since extubation has done well. He has had no recurrent symptoms.  Past Medical History  Diagnosis Date  . HYPERTENSION, HEART UNCONTROLLED W/O ASSOC CHF 02/03/2009  . Stroke, no residual 10.2.01  . History of colon cancer   . Heart murmur   . Hypertension     Past Surgical History  Procedure Date  . Colon resection 1996  . Esophageal dilation x 3   .  Colonoscopy   . Cataract extraction     Family History  Problem Relation Age of Onset  . Cancer    . Heart attack    . Diabetes    . Heart failure    . Cancer Mother   . Heart attack Father     Social History:  reports that he has never smoked. He has never used smokeless tobacco. He reports that he does not drink alcohol or use illicit drugs.  Allergies: No Known Allergies  Medications:  Prior to Admission:  Prescriptions prior to admission  Medication Sig Dispense Refill  . cholecalciferol (VITAMIN D) 1000 UNITS tablet Take 2,000 Units by mouth daily.        . clopidogrel (PLAVIX) 75 MG tablet Take 75 mg by mouth daily.        Marland Kitchen lisinopril (PRINIVIL,ZESTRIL) 20 MG tablet Take 20 mg by mouth daily.        . metFORMIN (GLUCOPHAGE) 500 MG tablet 2 tabs morning and 1 tab po qhs       . metipranolol (OPTIPRANOLOL) 0.3 % ophthalmic solution Place 1 drop into both eyes daily.        . metoprolol (TOPROL-XL) 50 MG 24 hr tablet Take 50 mg by mouth daily.        . Multiple Vitamin (MULTIVITAMIN) capsule Take 1 capsule  by mouth daily.        Marland Kitchen omeprazole (PRILOSEC) 20 MG capsule Take 20 mg by mouth daily.        . prazosin (MINIPRESS) 1 MG capsule Take 1 mg by mouth 2 (two) times daily.        . simvastatin (ZOCOR) 10 MG tablet Take 10 mg by mouth at bedtime.         Scheduled:   . aspirin  81 mg Oral Daily  . carvedilol  25 mg Oral BID WC  . furosemide  40 mg Oral BID  . heparin  5,000 Units Subcutaneous Q8H  . isosorbide mononitrate  30 mg Oral Daily  . pantoprazole  40 mg Oral Q1200  . simvastatin  10 mg Oral QHS  . sodium chloride  3 mL Intravenous Q12H  . Ticagrelor  90 mg Oral BID  . timolol  1 drop Both Eyes BID  . DISCONTD: carvedilol  12.5 mg Oral BID WC  . DISCONTD: sodium chloride  3 mL Intravenous Q12H   Continuous:   . DISCONTD: sodium chloride 10 mL/hr at 12/18/11 0357   ZOX:[id number], albuterol, sodium chloride, DISCONTD: sodium chloride, DISCONTD: sodium  chloride  Results for orders placed during the hospital encounter of 12/13/11 (from the past 48 hour(s))  BASIC METABOLIC PANEL     Status: Abnormal   Collection Time   12/19/11  6:45 AM      Component Value Range Comment   Sodium 137  135 - 145 (mEq/L)    Potassium 3.3 (*) 3.5 - 5.1 (mEq/L)    Chloride 99  96 - 112 (mEq/L)    CO2 28  19 - 32 (mEq/L)    Glucose, Bld 136 (*) 70 - 99 (mg/dL)    BUN 45 (*) 6 - 23 (mg/dL)    Creatinine, Ser 8.11 (*) 0.50 - 1.35 (mg/dL)    Calcium 8.9  8.4 - 10.5 (mg/dL)    GFR calc non Af Amer 30 (*) >90 (mL/min)    GFR calc Af Amer 35 (*) >90 (mL/min)   CBC     Status: Abnormal   Collection Time   12/20/11  5:50 AM      Component Value Range Comment   WBC 6.3  4.0 - 10.5 ([name]/uL)    RBC 3.65 (*) 4.22 - 5.81 (MIL/uL)    Hemoglobin 11.5 (*) 13.0 - 17.0 (g/dL)    HCT 91.4 (*) 78.2 - 52.0 (%)    MCV 94.0  78.0 - 100.0 (fL)    MCH 31.5  26.0 - 34.0 (pg)    MCHC 33.5  30.0 - 36.0 (g/dL)    RDW 95.6  21.3 - 08.6 (%)    Platelets 174  150 - 400 ([name]/uL)   BASIC METABOLIC PANEL     Status: Abnormal   Collection Time   12/20/11  5:50 AM      Component Value Range Comment   Sodium 139  135 - 145 (mEq/L)    Potassium 3.7  3.5 - 5.1 (mEq/L)    Chloride 100  96 - 112 (mEq/L)    CO2 29  19 - 32 (mEq/L)    Glucose, Bld 133 (*) 70 - 99 (mg/dL)    BUN 46 (*) 6 - 23 (mg/dL)    Creatinine, Ser 5.78 (*) 0.50 - 1.35 (mg/dL)    Calcium 9.2  8.4 - 10.5 (mg/dL)    GFR calc non Af Amer 26 (*) >90 (mL/min)    GFR calc Af  Amer 30 (*) >90 (mL/min)   GLUCOSE, CAPILLARY     Status: Abnormal   Collection Time   12/20/11  8:04 AM      Component Value Range Comment   Glucose-Capillary 152 (*) 70 - 99 (mg/dL)   GLUCOSE, CAPILLARY     Status: Abnormal   Collection Time   12/20/11 11:42 AM      Component Value Range Comment   Glucose-Capillary 162 (*) 70 - 99 (mg/dL)      No results found.  Review of Systems  Constitutional: Negative for fever, chills, weight loss and  malaise/fatigue.  HENT: Positive for tinnitus.   Eyes: Negative.   Respiratory: Negative for cough, hemoptysis, sputum production and shortness of breath.   Cardiovascular: Positive for chest pain. Negative for palpitations, orthopnea, claudication, leg swelling and PND.  Gastrointestinal: Negative.   Genitourinary: Negative.   Musculoskeletal: Negative.   Skin: Negative.   Neurological: Negative.   Endo/Heme/Allergies: Does not bruise/bleed easily.  Psychiatric/Behavioral: Negative.    Blood pressure 121/67, pulse 65, temperature 97.5 F (36.4 C), temperature source Oral, resp. rate 18, height 5\' 8"  (1.727 m), weight 63.095 kg (139 lb 1.6 oz), SpO2 98.00%. Physical Exam  Constitutional: He is oriented to person, place, and time. He appears well-developed and well-nourished. No distress.  HENT:  Head: Normocephalic and atraumatic.  Mouth/Throat: Oropharynx is clear and moist. No oropharyngeal exudate.  Eyes: EOM are normal. Pupils are equal, round, and reactive to light.  Neck: Neck supple. No JVD present. No thyromegaly present.  Cardiovascular: Normal rate, regular rhythm, intact distal pulses and normal pulses.  Exam reveals no gallop and no friction rub.   Murmur heard.      2/6 systolic ejection murmur over aorta  Respiratory: Breath sounds normal.  GI: Soft. Bowel sounds are normal. He exhibits no mass. There is no tenderness.  Musculoskeletal: He exhibits no edema.  Lymphadenopathy:    He has no cervical adenopathy.  Neurological: He is alert and oriented to person, place, and time. He has normal strength. No cranial nerve deficit or sensory deficit.  Skin: Skin is warm and dry.  Psychiatric: He has a normal mood and affect.   Cardiac Cath:     Procedural Findings: Hemodynamics: AO 148/82 with a mean of 111 mmHg LV 162 with an EDP of 42 mmHg  Coronary angiography: Coronary dominance: right  Left mainstem: Mild ostial disease of 20%.  Left anterior descending  (LAD): 50% ostial disease. The mid LAD is diffusely diseased up to 60-70%. The first diagonal branch has 40% disease at the origin and in the mid vessel. The second diagonal branch has 70% disease.  Left circumflex (LCx): Occluded at the ostium. There is minimal left to left collateral.  Right coronary artery (RCA): This is a dominant vessel. There is an 80-90% ostial stenosis.  Left ventriculography: Not performed to avoid increased contrast load.  Final Conclusions:   1. Severe 3 vessel obstructive coronary disease. 2. Severely elevated left ventricular filling pressures.    [facility]*                *[facility]*                      [address]                     [place], [place] [address]  [phone]   ------------------------------------------------------------ Transthoracic Echocardiography  Patient:    [name], [name] MR #:       [id number] Study Date: 12/13/2011 Gender:     M Age:        77 Height:     172.7cm Weight:     68kg BSA:        1.23m^2 Pt. Status: Room:       2505    PERFORMING   [place], [facility]  ATTENDING    Valera Castle, MD  SONOGRAPHER  [facility], RDCS  ORDERING     [place], [place] cc:  ------------------------------------------------------------ LV EF: 25% -   30%  ------------------------------------------------------------ Indications:      MI - acute 410.91.  ------------------------------------------------------------ History:   PMH:   Congestive heart failure.  Aortic valve disease.  Risk factors:  Hypertension.  ------------------------------------------------------------ Study Conclusions  - Left ventricle: The cavity size was normal. Wall thickness   was normal. Systolic function was severely reduced. The   estimated ejection fraction was in the range of 25% to   30%. Diffuse hypokinesis. There is akinesis of the   posterolateral myocardium. Features are  consistent with a   pseudonormal left ventricular filling pattern, with   concomitant abnormal relaxation and increased filling   pressure (grade 2 diastolic dysfunction). - Aortic valve: Valve mobility was restricted. There was   moderate to severe stenosis. Valve area: 1.03cm^2(VTI).   Valve area: 1.11cm^2 (Vmax). - Mitral valve: Calcified annulus. Mild to moderate   regurgitation. - Left atrium: The atrium was moderately dilated. Transthoracic echocardiography.  M-mode, complete 2D, spectral Doppler, and color Doppler.  Height:  Height: 172.7cm. Height: 68in.  Weight:  Weight: 68kg. Weight: 149.6lb.  Body mass index:  BMI: 22.8kg/m^2.  Body surface area:    BSA: 1.108m^2.  Blood pressure:     120/59.  Patient status:  Inpatient.  Location:  Bedside.  ------------------------------------------------------------  ------------------------------------------------------------ Left ventricle:  The cavity size was normal. Wall thickness was normal. Systolic function was severely reduced. The estimated ejection fraction was in the range of 25% to 30%. Diffuse hypokinesis.  Regional wall motion abnormalities: There is akinesis of the posterolateral myocardium. Features are consistent with a pseudonormal left ventricular filling pattern, with concomitant abnormal relaxation and increased filling pressure (grade 2 diastolic dysfunction).  ------------------------------------------------------------ Aortic valve:   Moderately calcified leaflets. Valve mobility was restricted.  Doppler:   There was moderate to severe stenosis.    No regurgitation.    VTI ratio of LVOT to aortic valve: 0.3. Valve area: 1.03cm^2(VTI). Indexed valve area: 0.57cm^2/m^2 (VTI). Peak velocity ratio of LVOT to aortic valve: 0.32. Valve area: 1.11cm^2 (Vmax). Indexed valve area: 0.61cm^2/m^2 (Vmax).    Mean gradient: 16mm Hg (S). Peak gradient: 29mm Hg  (S).  ------------------------------------------------------------ Aorta:  Aortic root: The aortic root was normal in size.  ------------------------------------------------------------ Mitral valve:   Calcified annulus. Mobility was not restricted.  Doppler:  Transvalvular velocity was within the normal range. There was no evidence for stenosis.  Mild to moderate regurgitation.    Peak gradient: 6mm Hg (D).  ------------------------------------------------------------ Left atrium:  The atrium was moderately dilated.  ------------------------------------------------------------ Right ventricle:  The cavity size was normal. Systolic function was normal.  ------------------------------------------------------------ Pulmonic valve:    Doppler:  Transvalvular velocity was within the normal range. There was no evidence for stenosis.   ------------------------------------------------------------ Tricuspid valve:   Structurally normal valve.    Doppler: Transvalvular velocity was within the normal range.  Trivial regurgitation.  ------------------------------------------------------------ Right atrium:  The atrium was normal in size.  ------------------------------------------------------------ Pericardium:  There was no pericardial effusion.  ------------------------------------------------------------ Systemic veins: Inferior vena cava: The vessel was normal in size.  ------------------------------------------------------------  2D measurements        Normal  Doppler measurements   Normal Left ventricle                 Left ventricle LVID ED,   50.3 mm     43-52   Ea, lat    6.69 cm/s   ------ chord,                         ann, tiss PLAX                           DP LVID ES,   43.3 mm     23-38   E/Ea, lat  17.6        ------ chord,                         ann, tiss     4 PLAX                           DP FS, chord,   14 %      >29     Ea, med    5.15 cm/s   ------ PLAX                            ann, tiss LVPW, ED   10.6 mm     ------  DP IVS/LVPW   0.99        <1.3    E/Ea, med  22.9        ------ ratio, ED                      ann, tiss     1 Ventricular septum             DP IVS, ED    10.5 mm     ------  LVOT LVOT                           Peak vel,  86.5 cm/s   ------ Diam, S      21 mm     ------  S Area       3.46 cm^2   ------  VTI, S     18.5 cm     ------ Aorta                          Aortic valve Root diam,   31 mm     ------  Peak vel,   270 cm/s   ------ ED                             S Left atrium                    Mean vel,   182 cm/s   ------ AP dim       39 mm     ------  S AP dim     2.16 cm/m^2 <2.2  VTI, S     62.4 cm     ------ index                          Mean         16 mm Hg  ------                                gradient,                                S                                Peak         29 mm Hg  ------                                gradient,                                S                                VTI ratio   0.3        ------                                LVOT/AV                                Area, VTI  1.03 cm^2   ------                                Area index 0.57 cm^2/m ------                                (VTI)           ^2                                Peak vel   0.32        ------                                ratio,                                LVOT/AV                                Area, Vmax 1.11 cm^2   ------                                Area index 0.61  cm^2/m ------                                (Vmax)          ^2                                Mitral valve                                Peak E vel  118 cm/s   ------                                Peak A vel 93.8 cm/s   ------                                Decelerati  211 ms     150-23                                on time                0                                Peak          6 mm Hg  ------                                 gradient,                                D                                Peak E/A    1.3        ------                                ratio                                Regurg       37 cm/s   ------                                alias vel,                                PISA                                Max regurg  589 cm/s   ------  vel                                Regurg VTI  195 cm     ------                                ERO, PISA  0.14 cm^2   ------                                Regurg       27 ml     ------                                vol, PISA                                Right ventricle                                Sa vel,    15.9 cm/s   ------                                lat ann,                                tiss DP   ------------------------------------------------------------ Prepared and Electronically Authenticated by  Olga Millers, MD, [facility] 2013-01-02T14:57:[id number]   Assessment/Plan:  The patient has significant ostial left main and severe multivessel coronary disease with  moderate to severe aortic stenosis status post ST segment elevation MI secondary to left circumflex occlusion. He has severely reduced systolic function on his admission echocardiogram and went into acute pulmonary edema post catheterization. He is now doing well clinically but I think his best long-term prognosis and quality of life is going to be with coronary bypass graft surgery and aortic valve replacement. His operative risk is increased due to his advanced age, left ventricular dysfunction, and chronic renal insufficiency. I think the potential benefits for survival far outweigh the risk given the degree of coronary stenosis and aortic stenosis. I discussed the recommendation for surgical treatment and the benefits and risks with the patient and wife including but not limited to bleeding, blood transfusion, infection, stroke, myocardial  infarction, graft failure, acute renal failure possibly requiring dialysis, heart block requiring permanent pacemaker, organ dysfunction, and death. They seem to understand and are inclined to proceed with surgery once he recovers. I agree with Dr. Swaziland that it is probably best to send him home for about one month so that he can recover from his current myocardial infarction and improve his strength and nutritional state. This will also help to minimize the insult of his kidneys. The patient will be discharged home and followed up in Dr. Elvis Coil office and I will be happy to see him when Dr. Swaziland feels he is ready for surgery.  [name] [name],[name] [name] 12/20/2011, 1:16 PM

## 2011-12-20 NOTE — Progress Notes (Signed)
Physical Therapy Evaluation Patient Details Name: Izaac Reisig MRN: [id number] DOB: [birth year] Today's Date: 12/20/2011  Problem List:  Patient Active Problem List  Diagnoses  . HYPERTENSION, HEART UNCONTROLLED W/O ASSOC CHF  . AORTIC STENOSIS/ INSUFFICIENCY, NON-RHEUMATIC  . NSTEMI (non-ST elevated myocardial infarction)  . Acute on chronic renal failure  . Anemia  . Acute systolic heart failure  . Hypokalemia    Past Medical History:  Past Medical History  Diagnosis Date  . HYPERTENSION, HEART UNCONTROLLED W/O ASSOC CHF 02/03/2009  . Stroke 10.2.01  . History of colon cancer   . Heart murmur   . Hypertension    Past Surgical History:  Past Surgical History  Procedure Date  . Colon resection 1996  . Esophageal dilation   . Colonoscopy   . Cataract extraction     PT Assessment/Plan/Recommendation PT Assessment Clinical Impression Statement: Pt tolerated treatment well. During DGI and ambulation noted patient did tend to lean toward side he was looking and did have a few LOB, recommended using a walker when first going home to help with safey. Educated family on proper techique for using walker and also for carrying walker up the stairs if the patient is alone and  needs to do so himself. Recommending HHPT for further rehab after discharge.  PT Recommendation/Assessment: Patient will need skilled PT in the acute care venue PT Problem List: Decreased activity tolerance;Decreased balance;Decreased mobility;Decreased coordination;Decreased safety awareness Barriers to Discharge: None PT Therapy Diagnosis : Difficulty walking PT Plan PT Frequency: Min 3X/week PT Treatment/Interventions: DME instruction;Gait training;Stair training;Functional mobility training;Therapeutic activities;Therapeutic exercise;Balance training;Patient/family education PT Recommendation Follow Up Recommendations: Supervision for mobility/OOB Equipment Recommended:  (daughter is going to borrow RW  from friend to use) PT Goals  Acute Rehab PT Goals PT Goal Formulation: With patient Time For Goal Achievement: 7 days Pt will Ambulate: >150 feet;with modified independence;with least restrictive assistive device PT Goal: Ambulate - Progress: Other (comment) Pt will Go Up / Down Stairs: Flight;with supervision;with least restrictive assistive device;with cues (comment type and amount) PT Goal: Up/Down Stairs - Progress: Other (comment) Pt will Perform Home Exercise Program: with supervision, verbal cues required/provided PT Goal: Perform Home Exercise Program - Progress: Other (comment) Additional Goals Additional Goal #1: Pt will score >45/56 on Berg Balance Test.  PT Goal: Additional Goal #1 - Progress: Other (comment)  PT Evaluation Precautions/Restrictions   Fall Prior Functioning  Home Living Lives With: Spouse Receives Help From: Family Type of Home: Apartment Home Layout: Two level Alternate Level Stairs-Rails: Right Alternate Level Stairs-Number of Steps: flight Home Access: Level entry Bathroom Shower/Tub: Engineer, manufacturing systems: Standard Home Adaptive Equipment: Shower chair with back;Grab bars around toilet;Grab bars in shower Prior Function Level of Independence: Independent with basic ADLs;Independent with homemaking with ambulation;Independent with transfers;Independent with gait Driving: Yes Vocation: Retired Designer, television/film set Level: Oriented X4 Sensation/Coordination Educational psychologist Movements are Fluid and Coordinated: Yes Extremity Assessment RUE Assessment RUE Assessment: Within Functional Limits LUE Assessment LUE Assessment: Within Functional Limits RLE Assessment RLE Assessment: Within Functional Limits LLE Assessment LLE Assessment: Within Functional Limits Mobility (including Balance) Transfers Transfers: Yes Ambulation/Gait Ambulation/Gait: Yes Ambulation/Gait Assistance: 4: Min assist Ambulation/Gait  Assistance Details (indicate cue type and reason): tends to loose balance a little when looking side to side, starts to stagger to side looking at  Ambulation Distance (Feet): 300 Feet Assistive device: None Gait Pattern: Decreased stride length;Lateral trunk lean to left Gait velocity: starts to lean toward side after a few feet, looses balance  when not focusing but is able to self correct without PT asssistance  Stairs: No Wheelchair Mobility Wheelchair Mobility: No  Posture/Postural Control Posture/Postural Control: No significant limitations Balance Balance Assessed: Yes Dynamic Gait Index Level Surface: Normal Change in Gait Speed: Normal Gait with Horizontal Head Turns: Mild Impairment Gait with Vertical Head Turns: Mild Impairment Gait and Pivot Turn: Normal Step Over Obstacle: Mild Impairment Step Around Obstacles: Normal Steps: Mild Impairment Total Score: 20  High Level Balance High Level Balance Comments: Pt was able to complete DGI with minimal LOB. He scored a 20/24 on the DGI. Did note pt had some trouble when looking side to side, he tended to start to walk to the side he turned his head.  Exercise    End of Session PT - End of Session Equipment Utilized During Treatment: Gait belt Activity Tolerance: Patient tolerated treatment well Patient left: in bed;with call bell in reach;with family/visitor present General Behavior During Session: [facility] for tasks performed Cognition: [facility] for tasks performed  Elvera Bicker 12/20/2011, 1:39 PM  [facility] [phone] (pager)

## 2011-12-20 NOTE — Progress Notes (Signed)
CARDIAC REHAB PHASE I   PRE:  Rate/Rhythm: 57 SB    BP: sitting 21308    SaO2:  MODE:  Ambulation: 320 ft   POST:  Rate/Rhythm: 68    BP: sitting 65784     SaO2: 98 RA  Assist x1 with RW. Has trouble staying close to RW, esp in turns. VCs for safety. Wife sts he has stairs to climb outside of apt. Plan to not leave once there. Tired after walk. Ed completed including NTG, HF, daily wts, sodium, walking daily in apt. Will f/u. [phone]  [name] CES, ACSM

## 2011-12-20 NOTE — Progress Notes (Signed)
Nutrition Follow-up  Pt extubated 1/5. Noted discharge hopefully in 1-2 days.  Diet Order:  Carbohydrate Modified Medium Calorie. PO intake 100% per flowsheet records.  Re-estimated Nutrition Needs: 1600-1800 kcals, 80-90 gm  Meds: Scheduled Meds:   . aspirin  81 mg Oral Daily  . carvedilol  25 mg Oral BID WC  . furosemide  40 mg Oral BID  . heparin  5,000 Units Subcutaneous Q8H  . isosorbide mononitrate  30 mg Oral Daily  . pantoprazole  40 mg Oral Q1200  . simvastatin  10 mg Oral QHS  . sodium chloride  3 mL Intravenous Q12H  . Ticagrelor  90 mg Oral BID  . timolol  1 drop Both Eyes BID  . DISCONTD: carvedilol  12.5 mg Oral BID WC  . DISCONTD: sodium chloride  3 mL Intravenous Q12H   Continuous Infusions:   . DISCONTD: sodium chloride 10 mL/hr at 12/18/11 0357   PRN Meds:.albuterol, albuterol, sodium chloride, DISCONTD: sodium chloride, DISCONTD: sodium chloride  Labs:  CMP     Component Value Date/Time   NA 139 12/20/2011 0550   K 3.7 12/20/2011 0550   CL 100 12/20/2011 0550   CO2 29 12/20/2011 0550   GLUCOSE 133* 12/20/2011 0550   BUN 46* 12/20/2011 0550   CREATININE 2.21* 12/20/2011 0550   CALCIUM 9.2 12/20/2011 0550   PROT 6.0 12/13/2011 0252   ALBUMIN 3.1* 12/13/2011 0252   AST 17 12/13/2011 0252   ALT 13 12/13/2011 0252   ALKPHOS 57 12/13/2011 0252   BILITOT 0.4 12/13/2011 0252   GFRNONAA 26* 12/20/2011 0550   GFRAA 30* 12/20/2011 0550     Intake/Output Summary (Last 24 hours) at 12/20/11 1031 Last data filed at 12/20/11 0500  Gross per 24 hour  Intake    123 ml  Output    200 ml  Net    -77 ml    Weight Status:  63 kg (1/9) -- down likely due to diuresis  Nutrition Dx:  Inadequate Oral Intake, resolved  Goal:  Meet 90-100% of estimated nutrition needs, met Monitor: PO intake, labs, weight, I/O's  Intervention:  No nutrition intervention warranted at this time  RD to follow for nutrition care plan   Alger Memos Pager #:  [phone]

## 2011-12-20 NOTE — Progress Notes (Signed)
   CARE MANAGEMENT NOTE 12/20/2011  Patient:  [name] [name],[name] [name]   Account Number:  [id number]  Date Initiated:  12/20/2011  Documentation initiated by:  GRAVES-BIGELOW,Tekeshia Klahr  Subjective/Objective Assessment:   Pt admitted with Nstemi. Plan for home with [facility] RN. Will speak to pt and see what agency he will like to go with. PT is still working with pt- will look at recommendations.     Action/Plan:   CM will place Brilinta card for 30 day supply in shadow chart. Please give pt card before d/c. CM will call [facility] to make sure they have in stock. Will make [facility] RN referral  with [facility].   Anticipated DC Date:  12/22/2011   Anticipated DC Plan:  HOME W HOME HEALTH SERVICES      DC Planning Services  CM consult      [facility] Choice  HOME HEALTH   Choice offered to / List presented to:  C-1 Patient        HH arranged  HH-10 DISEASE MANAGEMENT  HH-1 RN      [facility] agency  [facility]   Status of service:  Completed, signed off Medicare Important Message given?   (If response is "NO", the following Medicare IM given date fields will be blank) Date Medicare IM given:   Date Additional Medicare IM given:    Discharge Disposition:  HOME W HOME HEALTH SERVICES  Per UR Regulation:    Comments:  12-20-11 [id number], RN,BSN [phone] CM did make referral with [facility] for services. SOC to begin within 24-48 hrs psot d/c. CM called [facility] and they have 1 bottle in stock. Cm will make pt aware.

## 2011-12-21 LAB — GLUCOSE, CAPILLARY: Glucose-Capillary: 147 mg/dL — ABNORMAL HIGH (ref 70–99)

## 2011-12-21 LAB — BASIC METABOLIC PANEL
GFR calc Af Amer: 30 mL/min — ABNORMAL LOW (ref >90)
GFR calc non Af Amer: 26 mL/min — ABNORMAL LOW (ref >90)
Potassium: 3.4 mEq/L — ABNORMAL LOW (ref 3.5–5.1)
Sodium: 136 mEq/L (ref 135–145)

## 2011-12-21 MED ORDER — ISOSORBIDE MONONITRATE ER 60 MG PO TB24
60.0000 mg | ORAL_TABLET | Freq: Every day | ORAL | Status: DC
  Administered 2011-12-21 – 2011-12-22 (×2): 60 mg via ORAL
  Filled 2011-12-21 (×2): qty 1

## 2011-12-21 MED ORDER — GUAIFENESIN-DM 100-10 MG/5ML PO SYRP
15.0000 mL | ORAL_SOLUTION | ORAL | Status: DC | PRN
  Administered 2011-12-21: 15 mL via ORAL
  Filled 2011-12-21: qty 5

## 2011-12-21 NOTE — Progress Notes (Signed)
CARDIAC REHAB PHASE I   PRE:  Rate/Rhythm: 60 SR    BP: sitting 124/56    SaO2:   MODE:  Ambulation: 500 ft   POST:  Rate/Rhythm: 68    BP: sitting 150/70     SaO2:   Tolerated fairly well, stronger today with more focus to stay closer to RW. Increased distance. Had x1 incident of getting foot outside of RW leg when he got close to wall on turn. Continue to give vcs for standing tall and close to RW. Less fatigue today. HHPT will be of much benefit as pt has stairs to enter home. Will f/u. [phone]  [name] CES, ACSM

## 2011-12-21 NOTE — Progress Notes (Signed)
Cardiology Progress Note Patient Name: [name] [name] Date of Encounter: 12/21/2011, 6:49 AM     Subjective  Developed nosebleed last night. SQ heparin stopped. Nosebleed now controlled. Denies SOB or chest pain. Making progress with PT   Objective   Telemetry: Sinus rhythm 55-60  Medications: . aspirin  81 mg Oral Daily  . carvedilol  12.5 mg Oral BID WC  . heparin  5,000 Units Subcutaneous Q8H  . pantoprazole  40 mg Oral Q1200  . simvastatin  10 mg Per Tube QHS  . Ticagrelor  90 mg Oral BID  . timolol  1 drop Both Eyes BID    Physical Exam: Temp:  [97.4 F (36.3 C)-98.3 F (36.8 C)] 97.4 F (36.3 C) (01/10 0500) Pulse Rate:  [58-68] 61  (01/10 0500) Resp:  [18-24] 18  (01/10 0500) BP: (97-158)/(55-73) 158/73 mmHg (01/10 0500) SpO2:  [92 %] 92 % (01/10 0500) Weight:  [62.1 kg (136 lb 14.5 oz)] 62.1 kg (136 lb 14.5 oz) (01/10 0500)  General: Elderly white male, in no acute distress. Head: Normocephalic, atraumatic, sclera non-icteric, nares are without discharge.  Neck: Supple. Negative for carotid bruits or JVD Lungs: Fine bibasilar rales. No wheezes or rhonchi. Breathing is unlabored. Heart: RRR S1 S2.  harsh grade 2-3/6 systolic murmur RUSB. No rubs or gallops.  Abdomen: Soft, non-tender, non-distended with normoactive bowel sounds. No rebound/guarding. No obvious abdominal masses. Msk:  Strength and tone appear normal for age. Extremities: No edema. No clubbing or cyanosis. Distal pedal pulses are 2+ and equal bilaterally. Neuro: Alert and oriented X 3. Moves all extremities spontaneously. Psych:  Responds to questions appropriately with a normal affect.  Intake/Output Summary (Last 24 hours) at 12/19/11 0822 Last data filed at 12/19/11 0500  Gross per 24 hour  Intake    711 ml  Output   1050 ml  Net   -339 ml   Labs:  [facility] 12/19/11 0645 12/18/11 0629  NA 137 137  K 3.3* 3.8  CL 99 97  CO2 28 30  GLUCOSE 136* 149*  BUN 45* 49*  CREATININE  1.95* 2.09*  CALCIUM 8.9 9.1   Basename 12/17/11 0610  WBC 6.1  HGB 11.4*  HCT 33.7*  MCV 95.2  PLT 144*     12/13/2011 07:45 12/13/2011 18:00  CK, MB 26.6 (HH) 65.4 (HH)  CK Total 449 (H) 1098 (H)  Troponin I 13.96 (HH) >25.00 (HH)     12/14/2011 04:00  Cholesterol 124  Triglycerides 69  HDL 47  LDL (calc) 63  VLDL 14  Total CHOL/HDL Ratio 2.6   Radiology/Studies:   12/13/11 - 2D Echocardiogram Study Conclusions - Left ventricle: The cavity size was normal. Wall thickness was normal. Systolic function was severely reduced. The estimated ejection fraction was in the range of 25% to 30%. Diffuse hypokinesis. There is akinesis of the posterolateral myocardium. Features are consistent with a pseudonormal left ventricular filling pattern, with concomitant abnormal relaxation and increased filling pressure (grade 2 diastolic dysfunction). - Aortic valve: Valve mobility was restricted. There was moderate to severe stenosis. Valve area: 1.03cm^2(VTI). Valve area: 1.11cm^2 (Vmax). - Mitral valve: Calcified annulus. Mild to moderate regurgitation. - Left atrium: The atrium was moderately dilated.  12/13/11 - Left Heart Cath, Selective Coronary Angiography Hemodynamics:  AO 148/82 with a mean of 111 mmHg  LV 162 with an EDP of 42 mmHg  Coronary dominance: right  Left mainstem: Mild ostial disease of 20%.  Left anterior descending (LAD): 50%  ostial disease. The mid LAD is diffusely diseased up to 60-70%. The first diagonal branch has 40% disease at the origin and in the mid vessel. The second diagonal branch has 70% disease.  Left circumflex (LCx): Occluded at the ostium. There is minimal left to left collateral.  Right coronary artery (RCA): This is a dominant vessel. There is an 80-90% ostial stenosis.  Left ventriculography: Not performed to avoid increased contrast load.  Final Conclusions:  1. Severe 3 vessel obstructive coronary disease.  2. Severely elevated left ventricular filling  pressures.  Recommendations: Aggressive medical therapy for pulmonary edema. Patient will be transferred to the intensive care unit for IV Lasix, nitroglycerin, and BiPAP therapy.   Dg Chest Port 1 View 12/17/2011 Findings: Interval extubation.  Patchy bilateral airspace opacities, possibly edema.  Left lower lobe opacity, likely a combination of atelectasis and effusion. No pneumothorax.  The heart is top normal in size.  Stable right subclavian venous catheter.  IMPRESSION: Interval extubation.  Patchy bilateral airspace opacities, possibly edema.  Left lower lobe opacity, likely a combination of atelectasis and effusion.    Assessment and Plan  78 y.o. male w/ PMHx significant for Aortic Stenosis, HTN, CKD, and CVA who presented to [facility] on 12/13/11 with complaints of chest pain and had new ST depressions on EKG and positive poc troponin.   1. NSTEMI: Secondary to ostial LCx occlusion. No recurrent angina  2. CAD: L main 50% ostial, Diffuse moderate LAD disease, 100% ostial LCX, 80% ostial RCA.   3. Moderate to severe aortic stenosis by Echo. Mean gradient 16 mmHg, peak 29 mmHg, AVA of 1.1 cm2. By cath AV gradient 14 mmHg.   4. Acute on chronic renal insufficiency. Reanl function stable today.   5. CHF with acute systolic failure and pulmonary edema. EF 25-30 %. Clinically improved. Weight stable. Fluid balance negative  6. Anemia. Mild.   7. Hypokalemia.   8. Deconditioning.   Plan: Progressing well with cardiac Rehab and PT. Appreciate Dr. Sharee Pimple input. Will continue to work with PT today. Plan on discharge in am with [facility] and PT.    Theron Arista [facility] 12/21/2011 6:49 AM

## 2011-12-21 NOTE — Progress Notes (Signed)
Patient has been referred to [facility] for [facility] RN services. Spoke with Hilda Lias who confirmed the referral.

## 2011-12-21 NOTE — Progress Notes (Signed)
RN spoke with [name] [name] concerning nose bleeds. Pt scheduled for 5000 units sq heparin at 0600. Per NP, order to change heparin to SCDs. Pt given 15ml of robutussin for cough. Fine crackles in bil bases of lungs. Pt advised to use IS. Will continue to monitor.

## 2011-12-21 NOTE — Progress Notes (Addendum)
Pt experienced nose bleeds X2 during 7p-7a shift. First occurrence at 0300 during ambulation to bathroom. Nose bleed occurring at 0410 am while pt in bed had duration of 10 minutes. Warm towel placed over patient's nose, pressure placed by RN. Pt states nose bleed during night occurred 1 night night ago, does not recall informing MD of nose bleeds. Heart rate 58,  B/P 127/62. RN assessed fine crackles in bil bases of pt's lungs. Pt complaining of non-productive cough. Will continue to monitor.

## 2011-12-21 NOTE — Plan of Care (Signed)
Problem: Phase I Progression Outcomes Goal: Initial discharge plan identified Outcome: Completed/Met Date Met:  12/21/11 Home with spouse and HHRN Goal: Other Phase I Outcomes/Goals Outcome: Completed/Met Date Met:  12/21/11 Bed alarm

## 2011-12-22 LAB — BASIC METABOLIC PANEL
BUN: 40 mg/dL — ABNORMAL HIGH (ref 6–23)
Chloride: 99 mEq/L (ref 96–112)
GFR calc Af Amer: 30 mL/min — ABNORMAL LOW (ref >90)
Glucose, Bld: 142 mg/dL — ABNORMAL HIGH (ref 70–99)
Potassium: 3.5 mEq/L (ref 3.5–5.1)
Sodium: 139 mEq/L (ref 135–145)

## 2011-12-22 LAB — GLUCOSE, CAPILLARY
Glucose-Capillary: 143 mg/dL — ABNORMAL HIGH (ref 70–99)
Glucose-Capillary: 179 mg/dL — ABNORMAL HIGH (ref 70–99)
Glucose-Capillary: 175 mg/dL — ABNORMAL HIGH (ref 70–99)

## 2011-12-22 MED ORDER — ISOSORBIDE MONONITRATE ER 60 MG PO TB24: 60.0000 mg | ORAL_TABLET | Freq: Every day | ORAL | Status: DC

## 2011-12-22 MED ORDER — FUROSEMIDE 40 MG PO TABS: 40.0000 mg | ORAL_TABLET | Freq: Two times a day (BID) | ORAL | Status: DC

## 2011-12-22 MED ORDER — ISOSORBIDE MONONITRATE ER 30 MG PO TB24
30.0000 mg | ORAL_TABLET | Freq: Every day | ORAL | Status: DC
  Administered 2011-12-23: 30 mg via ORAL
  Filled 2011-12-22: qty 1

## 2011-12-22 MED ORDER — ASPIRIN 81 MG PO CHEW: 81.0000 mg | CHEWABLE_TABLET | Freq: Every day | ORAL | Status: DC

## 2011-12-22 MED ORDER — CARVEDILOL 25 MG PO TABS: 25.0000 mg | ORAL_TABLET | Freq: Two times a day (BID) | ORAL | Status: DC

## 2011-12-22 MED ORDER — TICAGRELOR 90 MG PO TABS: 90.0000 mg | ORAL_TABLET | Freq: Two times a day (BID) | ORAL | Status: DC

## 2011-12-22 MED ORDER — ISOSORBIDE MONONITRATE ER 30 MG PO TB24: 60.0000 mg | ORAL_TABLET | Freq: Every day | ORAL | Status: DC

## 2011-12-22 MED ORDER — FUROSEMIDE 40 MG PO TABS
40.0000 mg | ORAL_TABLET | Freq: Every day | ORAL | Status: DC
  Administered 2011-12-23: 40 mg via ORAL
  Filled 2011-12-22: qty 1

## 2011-12-22 MED ORDER — ISOSORBIDE MONONITRATE ER 30 MG PO TB24: 30.0000 mg | ORAL_TABLET | Freq: Every day | ORAL | Status: DC

## 2011-12-22 NOTE — Progress Notes (Signed)
Pt. Wife came out and stated patient didn't know his name. I went to patients room neuro check preformed. Neuro check was normal  Pt B/P taken 80/44. Theodore Demark, PA notified. No new orders received. Pt returned to bed. Will continue to monitor patient

## 2011-12-22 NOTE — Progress Notes (Signed)
Physical Therapy Treatment Patient Details Name: [name] [name] MRN: [id number] DOB: 1926-01-19 Today's Date: 12/22/2011  PT Assessment/Plan  PT - Assessment/Plan Comments on Treatment Session: Pt scored a 51/56 on the Berg Balance Scale indicating a moderate risk for falling, still recommending using a RW when first going home for safety. Recommending pt have a HH safety evaluation and please give patient a prescription to follow up with outpatient PT at a future date.  PT Plan: Discharge plan remains appropriate PT Frequency: Min 3X/week Follow Up Recommendations: Home health PT (with transition to outpatient PT ) Equipment Recommended:  (going to borrow a RW from a friend) PT Goals  Acute Rehab PT Goals PT Goal Formulation: With patient PT Goal: Ambulate - Progress: Progressing toward goal PT Goal: Up/Down Stairs - Progress: Progressing toward goal PT Goal: Perform Home Exercise Program - Progress: Progressing toward goal Additional Goals PT Goal: Additional Goal #1 - Progress: Met  PT Treatment Precautions/Restrictions  Precautions Precautions: Fall Mobility (including Balance) Bed Mobility Bed Mobility: No Transfers Transfers: Yes Sit to Stand: 7: Independent Stand to Sit: 7: Independent Stand Pivot Transfers: 7: Independent Ambulation/Gait Ambulation/Gait: Yes Ambulation/Gait Assistance: 5: Supervision Ambulation/Gait Assistance Details (indicate cue type and reason): pt was more balanced today and was able to ambulate without any LOB or staggers to the side  Ambulation Distance (Feet): 100 Feet Assistive device: None Gait Pattern: Decreased stride length Stairs: No Wheelchair Mobility Wheelchair Mobility: No  Hospital doctor Sit to Stand: Able to stand without using hands and stabilize independently Standing Unsupported: Able to stand safely 2 minutes Sitting with Back Unsupported but Feet Supported on Floor or Stool: Able to sit safely and securely 2  minutes Stand to Sit: Sits safely with minimal use of hands Transfers: Able to transfer safely, minor use of hands Standing Unsupported with Eyes Closed: Able to stand 10 seconds with supervision Standing Ubsupported with Feet Together: Able to place feet together independently and stand for 1 minute with supervision From Standing, Reach Forward with Outstretched Arm: Can reach confidently >25 cm (10") From Standing Position, Pick up Object from Floor: Able to pick up shoe safely and easily From Standing Position, Turn to Look Behind Over each Shoulder: Looks behind from both sides and weight shifts well Turn 360 Degrees: Able to turn 360 degrees safely in 4 seconds or less Standing Unsupported, Alternately Place Feet on Step/Stool: Able to stand independently and complete 8 steps >20 seconds Standing Unsupported, One Foot in Front: Able to plae foot ahead of the other independently and hold 30 seconds Standing on One Leg: Able to lift leg independently and hold 5-10 seconds Total Score: 51  High Level Balance High Level Balance Comments: Pt scored a 51/56 on the Berg balance scale indicating that he is at moderate risk for falling but on the boarder line for being low risk of falling. Do still recommend using RW when first going home at this time  for safety until feels comfortable without any assistive device. Should work on more of difficult balance activities in outpatiient PT.    End of Session PT - End of Session Equipment Utilized During Treatment: Gait belt Activity Tolerance: Patient tolerated treatment well Patient left: in bed;with call bell in reach;with family/visitor present General Behavior During Session: [facility] for tasks performed Cognition: [facility] for tasks performed  Elvera Bicker 12/22/2011, 10:20 AM [facility] [phone] (pager)

## 2011-12-22 NOTE — Progress Notes (Signed)
Cardiology Progress Note Patient Name: [name] [name] Date of Encounter: 12/22/2011, 8:47 AM     Subjective   Denies SOB or chest pain. Making progress with PT. Feels great.   Objective   Telemetry: Sinus rhythm 55-60  Medications: . aspirin  81 mg Oral Daily  . carvedilol  12.5 mg Oral BID WC  . heparin  5,000 Units Subcutaneous Q8H  . pantoprazole  40 mg Oral Q1200  . simvastatin  10 mg Per Tube QHS  . Ticagrelor  90 mg Oral BID  . timolol  1 drop Both Eyes BID    Physical Exam: Temp:  [97.4 F (36.3 C)-97.8 F (36.6 C)] 97.5 F (36.4 C) (01/11 0500) Pulse Rate:  [56-112] 112  (01/11 0500) Resp:  [16-20] 16  (01/11 0500) BP: (121-145)/(54-64) 145/64 mmHg (01/11 0500) SpO2:  [94 %-96 %] 96 % (01/11 0500) Weight:  [63 kg (138 lb 14.2 oz)] 63 kg (138 lb 14.2 oz) (01/11 0500)  General: Elderly white male, in no acute distress. Head: Normocephalic, atraumatic, sclera non-icteric, nares are without discharge.  Neck: Supple. Negative for carotid bruits or JVD Lungs: Clear. No wheezes or rhonchi. Breathing is unlabored. Heart: RRR S1 S2.  harsh grade 2-3/6 systolic murmur RUSB. No rubs or gallops.  Abdomen: Soft, non-tender, non-distended with normoactive bowel sounds. No rebound/guarding. No obvious abdominal masses. Msk:  Strength and tone appear normal for age. Extremities: No edema. No clubbing or cyanosis. Distal pedal pulses are 2+ and equal bilaterally. Neuro: Alert and oriented X 3. Moves all extremities spontaneously. Psych:  Responds to questions appropriately with a normal affect.  Intake/Output Summary (Last 24 hours) at 12/19/11 0822 Last data filed at 12/19/11 0500  Gross per 24 hour  Intake    711 ml  Output   1050 ml  Net   -339 ml   Labs: BMET    Component Value Date/Time   NA 139 12/22/2011 0641   K 3.5 12/22/2011 0641   CL 99 12/22/2011 0641   CO2 28 12/22/2011 0641   GLUCOSE 142* 12/22/2011 0641   BUN 40* 12/22/2011 0641   CREATININE 2.20*  12/22/2011 0641   CALCIUM 9.4 12/22/2011 0641   GFRNONAA 26* 12/22/2011 0641   GFRAA 30* 12/22/2011 0641      Radiology/Studies:   12/13/11 - 2D Echocardiogram Study Conclusions - Left ventricle: The cavity size was normal. Wall thickness was normal. Systolic function was severely reduced. The estimated ejection fraction was in the range of 25% to 30%. Diffuse hypokinesis. There is akinesis of the posterolateral myocardium. Features are consistent with a pseudonormal left ventricular filling pattern, with concomitant abnormal relaxation and increased filling pressure (grade 2 diastolic dysfunction). - Aortic valve: Valve mobility was restricted. There was moderate to severe stenosis. Valve area: 1.03cm^2(VTI). Valve area: 1.11cm^2 (Vmax). - Mitral valve: Calcified annulus. Mild to moderate regurgitation. - Left atrium: The atrium was moderately dilated.  12/13/11 - Left Heart Cath, Selective Coronary Angiography Hemodynamics:  AO 148/82 with a mean of 111 mmHg  LV 162 with an EDP of 42 mmHg  Coronary dominance: right  Left mainstem: Mild ostial disease of 20%.  Left anterior descending (LAD): 50% ostial disease. The mid LAD is diffusely diseased up to 60-70%. The first diagonal branch has 40% disease at the origin and in the mid vessel. The second diagonal branch has 70% disease.  Left circumflex (LCx): Occluded at the ostium. There is minimal left to left collateral.  Right coronary artery (  RCA): This is a dominant vessel. There is an 80-90% ostial stenosis.  Left ventriculography: Not performed to avoid increased contrast load.  Final Conclusions:  1. Severe 3 vessel obstructive coronary disease.  2. Severely elevated left ventricular filling pressures.  Recommendations: Aggressive medical therapy for pulmonary edema. Patient will be transferred to the intensive care unit for IV Lasix, nitroglycerin, and BiPAP therapy.   Dg Chest Port 1 View 12/17/2011 Findings: Interval extubation.  Patchy  bilateral airspace opacities, possibly edema.  Left lower lobe opacity, likely a combination of atelectasis and effusion. No pneumothorax.  The heart is top normal in size.  Stable right subclavian venous catheter.  IMPRESSION: Interval extubation.  Patchy bilateral airspace opacities, possibly edema.  Left lower lobe opacity, likely a combination of atelectasis and effusion.    Assessment and Plan  78 y.o. male w/ PMHx significant for Aortic Stenosis, HTN, CKD, and CVA who presented to [facility] on 12/13/11 with complaints of chest pain and had new ST depressions on EKG and positive poc troponin.   1. NSTEMI: Secondary to ostial LCx occlusion. No recurrent angina  2. CAD: L main 50% ostial, Diffuse moderate LAD disease, 100% ostial LCX, 80% ostial RCA.   3. Moderate to severe aortic stenosis by Echo. Mean gradient 16 mmHg, peak 29 mmHg, AVA of 1.1 cm2. By cath AV gradient 14 mmHg.   4. Acute on chronic renal insufficiency. Reanl function stable today.   5. CHF with acute systolic failure and pulmonary edema. EF 25-30 %. Clinically improved. Weight stable. Fluid balance negative  6. Anemia. Mild.   7. Deconditioning.   Plan: Progressing well with cardiac Rehab and PT. Appreciate Dr. Sharee Pimple input. Will continue to work with PT today. Plan on discharge today with [facility] and PT. Follow up in our office with Lawson Fiscal in 1 week with BMET, CBC.    Theron Arista [facility] 12/22/2011 8:47 AM

## 2011-12-22 NOTE — Progress Notes (Signed)
   CARE MANAGEMENT NOTE 12/22/2011  Patient:  [name] [name],[name] [name]   Account Number:  [id number]  Date Initiated:  12/20/2011  Documentation initiated by:  GRAVES-BIGELOW,Gerrell Tabet  Subjective/Objective Assessment:   Pt admitted with Nstemi. Plan for home with [facility] RN. Will speak to pt and see what agency he will like to go with. PT is still working with pt- will look at recommendations.     Action/Plan:   CM will place Brilinta card for 30 day supply in shadow chart. Please give pt card before d/c. CM will call [facility] to make sure they have in stock. Will make [facility] RN referral  with [facility].   Anticipated DC Date:  12/22/2011   Anticipated DC Plan:  HOME W HOME HEALTH SERVICES      DC Planning Services  CM consult      [facility] Choice  HOME HEALTH   Choice offered to / List presented to:  C-1 Patient        HH arranged  HH-10 DISEASE MANAGEMENT  HH-1 RN  HH-2 PT      HH agency  [facility]   Status of service:  Completed, signed off Medicare Important Message given?   (If response is "NO", the following Medicare IM given date fields will be blank) Date Medicare IM given:   Date Additional Medicare IM given:    Discharge Disposition:  HOME W HOME HEALTH SERVICES  Per UR Regulation:    Comments:  12-22-11 1419 Tomi Bamberger, RN,BSN [phone] CM made referral with [facility] for PT consult for safety evaluation for home. Pt will hopefully d/c on 12-23-11 with [facility] services. SOC to begin within 24-48 hrs post d/c.   12/21/2011  0845    Konrad Felix RN, MSN, OCN, case manager Confirmed referral for [facility] RN with Hilda Lias of [facility].  12-20-11 [address], [place] [phone] CM did make referral with [facility] for services. SOC to begin within 24-48 hrs psot d/c. CM called [facility] and they have 1 bottle in stock. Cm will make pt aware.

## 2011-12-22 NOTE — Progress Notes (Signed)
Pt wife stated pt was complaining of having difficulty swallowing. Theodore Demark, PA notified and swallow screen preformed. Patient passed swallow screen and is sitting up in bed talking to wife and daughter. Will continue to monitor. No new orders recieved

## 2011-12-22 NOTE — Discharge Summary (Addendum)
CARDIOLOGY DISCHARGE SUMMARY   Patient ID: [name] [name] MRN: [id number] DOB/AGE: 07-15-1926 78 y.o.  Admit date: 12/13/2011 Discharge date: 12/22/2011  Primary Discharge Diagnosis:  Non-ST elevation MI with acute pulmonary edema requiring intubation Secondary Discharge Diagnosis:  Patient Active Problem List  Diagnoses  . HYPERTENSION, HEART UNCONTROLLED W/O ASSOC CHF  . AORTIC STENOSIS/ INSUFFICIENCY, NON-RHEUMATIC  . NSTEMI (non-ST elevated myocardial infarction)  . Acute on chronic renal failure  . Anemia  . Acute systolic heart failure  . Hypokalemia    Consults: Pulmonary and critical care medicine, triad cardiac and thoracic surgery, nutrition, physical therapy, cardiac rehabilitation  Procedures: Intubation and mechanical ventilation, 2-D echocardiogram, left heart catheterization, selective coronary angiography, insertion of central venous catheter, lower extremity venous Dopplers  Hospital Course: [name] is an 78 year old male with a history of coronary artery disease he developed chest pain and EMS was summoned. He received aspirin and nitroglycerin. In the emergency room, he had EKG changes and elevated cardiac enzymes. He was admitted for further evaluation and treatment. He was taken to the cath lab. The cardiac catheterization results are listed below.  Cardiac catheterization: Left mainstem: Mild ostial disease of 20%.  Left anterior descending (LAD): 50% ostial disease. The mid LAD is diffusely diseased up to 60-70%. The first diagonal branch has 40% disease at the origin and in the mid vessel. The second diagonal branch has 70% disease.  Left circumflex (LCx): Occluded at the ostium. There is minimal left to left collateral.  Right coronary artery (RCA): This is a dominant vessel. There is an 80-90% ostial stenosis.   He was in pulmonary edema and was transferred to the critical care unit for IV Lasix and placed on BiPAP. His condition worsened. Pulmonary  was consulted. He was intubated and a central line was placed.  He has chronic renal insufficiency but his creatinine worsened. This was followed closely during his hospital stay. His ACE inhibitor was discontinued. He was diuresed with a Lasix drip, and his volume status gradually improved. A 2-D echocardiogram showed aortic stenosis as well as mitral regurgitation. He was started on tube feeds to maintain his nutrition. By 12/16/2011, his respiratory status had improved and he was extubated. As his blood pressure improved, he was started on a beta blocker. This was uptitrated for better blood pressure and heart rate control. He was hypokalemic because of diuresis but this was supplemented and resolved. His creatinine peaked at 2.45 and trended back down slightly. At discharge he still has significant renal insufficiency and therefore the ACE inhibitor was not restarted.  Because of his prolonged hospitalization and acute illness, he had problems with deconditioning. He was seen by both cardiac rehabilitation and physical therapy. The physical therapy is to continue as an outpatient.  He was seen by cardiac surgery and consideration for bypass surgery with an aortic valve replacement. Dr. Laneta Simmers saw him and his recommendations are summarized below.  The patient has significant ostial left main and severe multivessel coronary disease with moderate to severe aortic stenosis status post ST segment elevation MI secondary to left circumflex occlusion. He has severely reduced systolic function on his admission echocardiogram and went into acute pulmonary edema post catheterization. I think his best long-term prognosis and quality of life is going to be with coronary bypass graft surgery and aortic valve replacement. His operative risk is increased due to his advanced age, left ventricular dysfunction, and chronic renal insufficiency.  I discussed the recommendation for surgical treatment and the benefits and  risks with the patient and wife. They seem to understand and are inclined to proceed with surgery once he recovers. I agree with Dr. Swaziland that it is probably best to send him home for about one month so that he can recover from his current myocardial infarction and improve his strength and nutritional state. This will also help to minimize the insult of his kidneys.  Physical therapy room and recommended a home health and safety evaluation. He will also have a home health RN and physical therapy. It was recommended that he use a rolling walker. His fall risk was communicated with the patient and his wife. He has tolerated treatment well.  On 12/22/2011, he was seen Dr Swaziland. He was ambulating without chest pain or shortness of breath and considered stable for discharge, to followup as an outpatient.   Labs:   Lab Results  Component Value Date   WBC 6.3 12/20/2011   HGB 11.5* 12/20/2011   HCT 34.3* 12/20/2011   MCV 94.0 12/20/2011   PLT 174 12/20/2011    Lab 12/22/11 0641  NA 139  K 3.5  CL 99  CO2 28  BUN 40*  CREATININE 2.20*  CALCIUM 9.4  PROT --  BILITOT --  ALKPHOS --  ALT --  AST --  GLUCOSE 142*   Lab Results  Component Value Date   CKTOTAL 1098* 12/13/2011   CKMB 65.4* 12/13/2011   TROPONINI >25.00* 12/13/2011    Lipid Panel     Component Value Date/Time   CHOL 124 12/14/2011 0400   TRIG 69 12/14/2011 0400   HDL 47 12/14/2011 0400   CHOLHDL 2.6 12/14/2011 0400   VLDL 14 12/14/2011 0400   LDLCALC 63 12/14/2011 0400     Radiology: PORTABLE CHEST - 1 VIEW  Comparison: 12/16/2011  Findings: Interval extubation.  Patchy bilateral airspace opacities, possibly edema. Left lower lobe opacity, likely a combination of atelectasis and effusion. No pneumothorax.  The heart is top normal in size.  Stable right subclavian venous catheter.  IMPRESSION: Interval extubation.  Patchy bilateral airspace opacities, possibly edema.  Left lower lobe opacity, likely a combination of  atelectasis and effusion.   EKG: 14-Dec-2011 06:56:15 Normal sinus rhythm rate 66 Left ventricular hypertrophy with repolarization abnormality Abnormal ECG  Echo: 12/13/2011 Study Conclusions  - Left ventricle: The cavity size was normal. Wall thickness was normal. Systolic function was severely reduced. The estimated ejection fraction was in the range of 25% to 30%. Diffuse hypokinesis. There is akinesis of the posterolateral myocardium. Features are consistent with a pseudonormal left ventricular filling pattern, with concomitant abnormal relaxation and increased filling pressure (grade 2 diastolic dysfunction). - Aortic valve: Valve mobility was restricted. There was moderate to severe stenosis. Valve area: 1.03cm^2(VTI). Valve area: 1.11cm^2 (Vmax). - Mitral valve: Calcified annulus. Mild to moderate regurgitation. - Left atrium: The atrium was moderately dilated.  Lower extremity venous Dopplers 12/13/2011 Summary: No evidence of deep vein thrombosis involving the right lower extremity and left lower extremity.    FOLLOW UP PLANS AND APPOINTMENTS Discharge Orders    Future Appointments: Provider: Department: Dept Phone: Center:   01/11/2012 10:30 AM Beatrice Lecher, PA [facility] [phone] [facility]     Future Orders Please Complete By Expires   Diet - low sodium heart healthy      Increase activity slowly      Call MD for:  redness, tenderness, or signs of infection (pain, swelling, redness, odor or green/yellow discharge around incision site)      (  HEART FAILURE PATIENTS) Call MD:  Anytime you have any of the following symptoms: 1) 3 pound weight gain in 24 hours or 5 pounds in 1 week 2) shortness of breath, with or without a dry hacking cough 3) swelling in the hands, feet or stomach 4) if you have to sleep on extra pillows at night in order to breathe.        Current Discharge Medication List    START taking these medications   Details  aspirin 81  MG chewable tablet Chew 1 tablet (81 mg total) by mouth daily.    carvedilol (COREG) 25 MG tablet Take 1 tablet (25 mg total) by mouth 2 (two) times daily with a meal. Qty: 60 tablet, Refills: 11    furosemide (LASIX) 40 MG tablet Take 1 tablet (40 mg total) by mouth 2 (two) times daily. Qty: 60 tablet, Refills: 11    isosorbide mononitrate (IMDUR) 30MG  24 hr tablet Take 1 tablet (30 mg total) by mouth daily. Qty: 30 tablet, Refills: 11    Ticagrelor (BRILINTA) 90 MG TABS tablet Take 1 tablet (90 mg total) by mouth 2 (two) times daily. Qty: 60 tablet, Refills: 11      CONTINUE these medications which have NOT CHANGED   Details  cholecalciferol (VITAMIN D) 1000 UNITS tablet Take 2,000 Units by mouth daily.      metFORMIN (GLUCOPHAGE) 500 MG tablet 2 tabs morning and 1 tab po qhs     metipranolol (OPTIPRANOLOL) 0.3 % ophthalmic solution Place 1 drop into both eyes daily.      Multiple Vitamin (MULTIVITAMIN) capsule Take 1 capsule by mouth daily.      omeprazole (PRILOSEC) 20 MG capsule Take 20 mg by mouth daily.      simvastatin (ZOCOR) 10 MG tablet Take 10 mg by mouth at bedtime.        STOP taking these medications     clopidogrel (PLAVIX) 75 MG tablet      lisinopril (PRINIVIL,ZESTRIL) 20 MG tablet      metoprolol (TOPROL-XL) 50 MG 24 hr tablet      prazosin (MINIPRESS) 1 MG capsule        Follow-up Information    Follow up with Tereso Newcomer, PA. (for Dr Daleen Squibb, see on January 31, 10:30 AM)    Contact information:   [address] [place] [address] [phone]       Follow up with Karn Pickler in 3 weeks. (As needed)    Contact information:   [address] [facility] [address] [phone]          BRING ALL MEDICATIONS WITH YOU TO FOLLOW UP APPOINTMENTS  Time spent with patient to include physician time: Signed: Theodore Demark 12/22/2011, 12:21 PM Co-Sign MD  Patient was kept overnight  secondary to hypotension and confusion, weakness. Imdur was adjusted to 30 mg daily from 60 mg daily. Lasix was decreased to 40 mg daily from BID. He was found to be hypokalemic on rounds day of discharge with K+ of 3.1. He was given 40 mEq po prior to leaving. He will need follow-up BMET next week. I have called the office to inform them of this.

## 2011-12-22 NOTE — Progress Notes (Signed)
CTSP re: hypotension.  [name] got his morning medications as prescribed. He received Lasix 40 mg, core eighth 25 mg, both at 8 AM. A 10 AM, he received Imdur 60 mg. At approximately 1 PM, he was not feeling well and when his blood pressure was checked it was approximately 80 systolic. This was confirmed manually. The patient was sitting up at that time. He was put back in bed but was very weak. He did not have chest pain or shortness of breath.  Dr. Swaziland was made aware of the situation and recommended decreasing his Lasix from 40 mg twice a day to 40 mg daily. His Imdur was decreased from 60 mg daily to 30 mg daily. He will be held overnight.  Later, his blood pressure improved somewhat but he remained weak. He also had some possible mental status changes that resolved without intervention. A bedside swallow screen was performed and he passed. There was no unilateral weakness.   The situation was discussed with the patient, his wife and daughter. They were apprised of the medication changes made and advised that he was being kept overnight. If he has no problems overnight, he should be stable for discharge home tomorrow. They were in agreement with this plan. All questions were answered.

## 2011-12-23 DIAGNOSIS — I214 Non-ST elevation (NSTEMI) myocardial infarction: Secondary | ICD-10-CM

## 2011-12-23 LAB — BASIC METABOLIC PANEL
Chloride: 98 mEq/L (ref 96–112)
Creatinine, Ser: 2.19 mg/dL — ABNORMAL HIGH (ref 0.50–1.35)
GFR calc Af Amer: 30 mL/min — ABNORMAL LOW (ref >90)
GFR calc non Af Amer: 26 mL/min — ABNORMAL LOW (ref >90)
Potassium: 3.1 mEq/L — ABNORMAL LOW (ref 3.5–5.1)

## 2011-12-23 LAB — GLUCOSE, CAPILLARY: Glucose-Capillary: 224 mg/dL — ABNORMAL HIGH (ref 70–99)

## 2011-12-23 MED ORDER — FUROSEMIDE 40 MG PO TABS: 40.0000 mg | ORAL_TABLET | Freq: Every day | ORAL | Status: DC

## 2011-12-23 MED ORDER — POTASSIUM CHLORIDE CRYS ER 20 MEQ PO TBCR
40.0000 meq | EXTENDED_RELEASE_TABLET | Freq: Once | ORAL | Status: AC
  Administered 2011-12-23: 40 meq via ORAL
  Filled 2011-12-23: qty 2

## 2011-12-23 NOTE — Progress Notes (Signed)
Pt very confused this morning. Pt states "feel like they are in a dream  and can't get out, scenarios keep repeating themselves".  Pt complains of no weakness, SOB, numbness, and pupils equal and react to light. Will continue to monitor.

## 2011-12-23 NOTE — Progress Notes (Signed)
SUBJECTIVE:Feeling much better, wants to go home. Concerned about cost of medications.  Principal Problem:  *NSTEMI (non-ST elevated myocardial infarction) Active Problems:  AORTIC STENOSIS/ INSUFFICIENCY, NON-RHEUMATIC  Acute on chronic renal failure  Anemia  Acute systolic heart failure  Hypokalemia   LABS: Basic Metabolic Panel:  Basename 12/23/11 0500 12/22/11 0641  NA 137 139  K 3.1* 3.5  CL 98 99  CO2 30 28  GLUCOSE 130* 142*  BUN 38* 40*  CREATININE 2.19* 2.20*  CALCIUM 9.0 9.4  MG -- --  PHOS -- --   RADIOLOGY: IMPRESSION: Interval extubation. Patchy bilateral airspace opacities, possibly edema. Left lower lobe opacity, likely a combination of atelectasis and effusion.  PHYSICAL EXAM BP 169/60  Pulse 62  Temp(Src) 98.4 F (36.9 C) (Oral)  Resp 18  Ht 5\' 8"  (1.727 m)  Wt 141 lb 8.6 oz (64.2 kg)  BMI 21.52 kg/m2  SpO2 98% General: Well developed, well nourished, in no acute distress Head: Eyes PERRLA, No xanthomas.   Normal cephalic and atramatic  Lungs: Clear bilaterally to auscultation and percussion. Heart: HRRR S1 S2, 2/6 systolic murmur MRG .  Pulses are 2+ & equal.            Positive carotid bruit. No JVD.  No abdominal bruits. No femoral bruits. Abdomen: Bowel sounds are positive, abdomen soft and non-tender without masses or                  Hernia's noted. Msk:  Back normal, normal gait. Normal strength and tone for age. Extremities: No clubbing, cyanosis or edema.  DP +1 Eccymosis  Noted on both arms. Cath insertion site  Neuro: Alert and oriented X 3. Psych:  Good affect, responds appropriately  TELEMETRY: Reviewed  ASSESSMENT AND PLAN: 1. NSTEMI:  3 V cad  Seen by B. Bartle.  He recommended that he recover from current event and increae strength.  Plan cardiac rehab.  ONce stronger plan for surgery (CABG and AVR) 2.  BP:  Better than yesterday.  I would favor running a little high given anatomy.  Wife will follow as outpatient.  Need to  replete K.  Send on  daily dose.  F/U this week as outpaient. 3.  AS:  Plan for AVR with CABG\ 4.  Dyslipidemia:  COntinue statin 5.  Renal:  Follow BMET this week with BNP.   Bettey Mare. Lyman Bishop NP [facility] 12/23/2011, 8:50 AM I have seen and examined patient.  Lungs CTA;  Cardia:  RRR  III/VI systolic murmur   Ext:  No edemai\ Plan to d/c with close outpatient f/u.

## 2011-12-23 NOTE — Discharge Summary (Signed)
Patient seen and examined and history reviewed. Agree with above findings and plan. See earlier rounding note.  Thedora Hinders 12/23/2011 10:23 AM

## 2011-12-23 NOTE — Progress Notes (Signed)
Patient seen and examined and history reviewed. Agree with above findings and plan.  Thedora Hinders 12/23/2011 10:22 AM

## 2011-12-26 DIAGNOSIS — I359 Nonrheumatic aortic valve disorder, unspecified: Secondary | ICD-10-CM | POA: Diagnosis not present

## 2011-12-26 DIAGNOSIS — I2510 Atherosclerotic heart disease of native coronary artery without angina pectoris: Secondary | ICD-10-CM | POA: Diagnosis not present

## 2011-12-26 DIAGNOSIS — E119 Type 2 diabetes mellitus without complications: Secondary | ICD-10-CM | POA: Diagnosis not present

## 2011-12-26 DIAGNOSIS — I509 Heart failure, unspecified: Secondary | ICD-10-CM | POA: Diagnosis not present

## 2011-12-26 DIAGNOSIS — I214 Non-ST elevation (NSTEMI) myocardial infarction: Secondary | ICD-10-CM | POA: Diagnosis not present

## 2011-12-26 DIAGNOSIS — I10 Essential (primary) hypertension: Secondary | ICD-10-CM | POA: Diagnosis not present

## 2011-12-27 DIAGNOSIS — I214 Non-ST elevation (NSTEMI) myocardial infarction: Secondary | ICD-10-CM | POA: Diagnosis not present

## 2011-12-27 DIAGNOSIS — I359 Nonrheumatic aortic valve disorder, unspecified: Secondary | ICD-10-CM | POA: Diagnosis not present

## 2011-12-27 DIAGNOSIS — I2510 Atherosclerotic heart disease of native coronary artery without angina pectoris: Secondary | ICD-10-CM | POA: Diagnosis not present

## 2011-12-27 DIAGNOSIS — E119 Type 2 diabetes mellitus without complications: Secondary | ICD-10-CM | POA: Diagnosis not present

## 2011-12-27 DIAGNOSIS — I509 Heart failure, unspecified: Secondary | ICD-10-CM | POA: Diagnosis not present

## 2011-12-27 DIAGNOSIS — I10 Essential (primary) hypertension: Secondary | ICD-10-CM | POA: Diagnosis not present

## 2011-12-29 DIAGNOSIS — I509 Heart failure, unspecified: Secondary | ICD-10-CM | POA: Diagnosis not present

## 2011-12-29 DIAGNOSIS — I10 Essential (primary) hypertension: Secondary | ICD-10-CM | POA: Diagnosis not present

## 2011-12-29 DIAGNOSIS — I2510 Atherosclerotic heart disease of native coronary artery without angina pectoris: Secondary | ICD-10-CM | POA: Diagnosis not present

## 2011-12-29 DIAGNOSIS — I359 Nonrheumatic aortic valve disorder, unspecified: Secondary | ICD-10-CM | POA: Diagnosis not present

## 2011-12-29 DIAGNOSIS — I214 Non-ST elevation (NSTEMI) myocardial infarction: Secondary | ICD-10-CM | POA: Diagnosis not present

## 2011-12-29 DIAGNOSIS — E119 Type 2 diabetes mellitus without complications: Secondary | ICD-10-CM | POA: Diagnosis not present

## 2012-01-01 DIAGNOSIS — I509 Heart failure, unspecified: Secondary | ICD-10-CM | POA: Diagnosis not present

## 2012-01-01 DIAGNOSIS — E119 Type 2 diabetes mellitus without complications: Secondary | ICD-10-CM | POA: Diagnosis not present

## 2012-01-01 DIAGNOSIS — I214 Non-ST elevation (NSTEMI) myocardial infarction: Secondary | ICD-10-CM | POA: Diagnosis not present

## 2012-01-01 DIAGNOSIS — I359 Nonrheumatic aortic valve disorder, unspecified: Secondary | ICD-10-CM | POA: Diagnosis not present

## 2012-01-01 DIAGNOSIS — I10 Essential (primary) hypertension: Secondary | ICD-10-CM | POA: Diagnosis not present

## 2012-01-01 DIAGNOSIS — I2510 Atherosclerotic heart disease of native coronary artery without angina pectoris: Secondary | ICD-10-CM | POA: Diagnosis not present

## 2012-01-02 DIAGNOSIS — I359 Nonrheumatic aortic valve disorder, unspecified: Secondary | ICD-10-CM | POA: Diagnosis not present

## 2012-01-02 DIAGNOSIS — I509 Heart failure, unspecified: Secondary | ICD-10-CM | POA: Diagnosis not present

## 2012-01-02 DIAGNOSIS — E119 Type 2 diabetes mellitus without complications: Secondary | ICD-10-CM | POA: Diagnosis not present

## 2012-01-02 DIAGNOSIS — I214 Non-ST elevation (NSTEMI) myocardial infarction: Secondary | ICD-10-CM | POA: Diagnosis not present

## 2012-01-02 DIAGNOSIS — I10 Essential (primary) hypertension: Secondary | ICD-10-CM | POA: Diagnosis not present

## 2012-01-02 DIAGNOSIS — I2510 Atherosclerotic heart disease of native coronary artery without angina pectoris: Secondary | ICD-10-CM | POA: Diagnosis not present

## 2012-01-03 DIAGNOSIS — I10 Essential (primary) hypertension: Secondary | ICD-10-CM | POA: Diagnosis not present

## 2012-01-03 DIAGNOSIS — I509 Heart failure, unspecified: Secondary | ICD-10-CM | POA: Diagnosis not present

## 2012-01-03 DIAGNOSIS — I359 Nonrheumatic aortic valve disorder, unspecified: Secondary | ICD-10-CM | POA: Diagnosis not present

## 2012-01-03 DIAGNOSIS — E119 Type 2 diabetes mellitus without complications: Secondary | ICD-10-CM | POA: Diagnosis not present

## 2012-01-03 DIAGNOSIS — I2510 Atherosclerotic heart disease of native coronary artery without angina pectoris: Secondary | ICD-10-CM | POA: Diagnosis not present

## 2012-01-03 DIAGNOSIS — I214 Non-ST elevation (NSTEMI) myocardial infarction: Secondary | ICD-10-CM | POA: Diagnosis not present

## 2012-01-05 DIAGNOSIS — I359 Nonrheumatic aortic valve disorder, unspecified: Secondary | ICD-10-CM | POA: Diagnosis not present

## 2012-01-05 DIAGNOSIS — I10 Essential (primary) hypertension: Secondary | ICD-10-CM | POA: Diagnosis not present

## 2012-01-05 DIAGNOSIS — I214 Non-ST elevation (NSTEMI) myocardial infarction: Secondary | ICD-10-CM | POA: Diagnosis not present

## 2012-01-05 DIAGNOSIS — I509 Heart failure, unspecified: Secondary | ICD-10-CM | POA: Diagnosis not present

## 2012-01-05 DIAGNOSIS — I2510 Atherosclerotic heart disease of native coronary artery without angina pectoris: Secondary | ICD-10-CM | POA: Diagnosis not present

## 2012-01-05 DIAGNOSIS — E119 Type 2 diabetes mellitus without complications: Secondary | ICD-10-CM | POA: Diagnosis not present

## 2012-01-08 DIAGNOSIS — I2510 Atherosclerotic heart disease of native coronary artery without angina pectoris: Secondary | ICD-10-CM | POA: Diagnosis not present

## 2012-01-08 DIAGNOSIS — I509 Heart failure, unspecified: Secondary | ICD-10-CM | POA: Diagnosis not present

## 2012-01-08 DIAGNOSIS — I359 Nonrheumatic aortic valve disorder, unspecified: Secondary | ICD-10-CM | POA: Diagnosis not present

## 2012-01-08 DIAGNOSIS — I10 Essential (primary) hypertension: Secondary | ICD-10-CM | POA: Diagnosis not present

## 2012-01-08 DIAGNOSIS — E119 Type 2 diabetes mellitus without complications: Secondary | ICD-10-CM | POA: Diagnosis not present

## 2012-01-08 DIAGNOSIS — I214 Non-ST elevation (NSTEMI) myocardial infarction: Secondary | ICD-10-CM | POA: Diagnosis not present

## 2012-01-09 ENCOUNTER — Ambulatory Visit (INDEPENDENT_AMBULATORY_CARE_PROVIDER_SITE_OTHER): Payer: Medicare Other | Admitting: *Deleted

## 2012-01-09 DIAGNOSIS — I119 Hypertensive heart disease without heart failure: Secondary | ICD-10-CM

## 2012-01-09 LAB — BASIC METABOLIC PANEL
Chloride: 102 mEq/L (ref 96–112)
GFR: 27.55 mL/min — ABNORMAL LOW (ref >60.00)
Potassium: 4.2 mEq/L (ref 3.5–5.1)
Sodium: 141 mEq/L (ref 135–145)

## 2012-01-10 ENCOUNTER — Encounter: Payer: Self-pay | Admitting: Cardiology

## 2012-01-10 DIAGNOSIS — I359 Nonrheumatic aortic valve disorder, unspecified: Secondary | ICD-10-CM | POA: Diagnosis not present

## 2012-01-10 DIAGNOSIS — E119 Type 2 diabetes mellitus without complications: Secondary | ICD-10-CM | POA: Diagnosis not present

## 2012-01-10 DIAGNOSIS — I509 Heart failure, unspecified: Secondary | ICD-10-CM | POA: Diagnosis not present

## 2012-01-10 DIAGNOSIS — I2510 Atherosclerotic heart disease of native coronary artery without angina pectoris: Secondary | ICD-10-CM | POA: Diagnosis not present

## 2012-01-10 DIAGNOSIS — I214 Non-ST elevation (NSTEMI) myocardial infarction: Secondary | ICD-10-CM | POA: Diagnosis not present

## 2012-01-10 DIAGNOSIS — I10 Essential (primary) hypertension: Secondary | ICD-10-CM | POA: Diagnosis not present

## 2012-01-11 ENCOUNTER — Encounter: Payer: Self-pay | Admitting: Physician Assistant

## 2012-01-11 ENCOUNTER — Ambulatory Visit (INDEPENDENT_AMBULATORY_CARE_PROVIDER_SITE_OTHER): Payer: Medicare Other | Admitting: Physician Assistant

## 2012-01-11 VITALS — BP 133/64 | HR 52 | Resp 18 | Ht 67.0 in | Wt 139.0 lb

## 2012-01-11 DIAGNOSIS — I255 Ischemic cardiomyopathy: Secondary | ICD-10-CM

## 2012-01-11 DIAGNOSIS — I5042 Chronic combined systolic (congestive) and diastolic (congestive) heart failure: Secondary | ICD-10-CM | POA: Insufficient documentation

## 2012-01-11 DIAGNOSIS — I5022 Chronic systolic (congestive) heart failure: Secondary | ICD-10-CM

## 2012-01-11 DIAGNOSIS — I25110 Atherosclerotic heart disease of native coronary artery with unstable angina pectoris: Secondary | ICD-10-CM | POA: Insufficient documentation

## 2012-01-11 DIAGNOSIS — I2589 Other forms of chronic ischemic heart disease: Secondary | ICD-10-CM

## 2012-01-11 DIAGNOSIS — I10 Essential (primary) hypertension: Secondary | ICD-10-CM | POA: Diagnosis not present

## 2012-01-11 DIAGNOSIS — N184 Chronic kidney disease, stage 4 (severe): Secondary | ICD-10-CM | POA: Insufficient documentation

## 2012-01-11 DIAGNOSIS — I2510 Atherosclerotic heart disease of native coronary artery without angina pectoris: Secondary | ICD-10-CM | POA: Diagnosis not present

## 2012-01-11 DIAGNOSIS — E785 Hyperlipidemia, unspecified: Secondary | ICD-10-CM

## 2012-01-11 DIAGNOSIS — I359 Nonrheumatic aortic valve disorder, unspecified: Secondary | ICD-10-CM | POA: Diagnosis not present

## 2012-01-11 DIAGNOSIS — N189 Chronic kidney disease, unspecified: Secondary | ICD-10-CM

## 2012-01-11 MED ORDER — ISOSORBIDE MONONITRATE ER 30 MG PO TB24: 30.0000 mg | ORAL_TABLET | Freq: Every day | ORAL | Status: DC

## 2012-01-11 NOTE — Assessment & Plan Note (Signed)
Doing well as noted above.  Continue ASA and Brilinta.  Schedule appt with Dr. Laneta Simmers to discuss timing of CABG/AVR.

## 2012-01-11 NOTE — Assessment & Plan Note (Addendum)
Lab Results  Component Value Date   CREATININE 2.4* 01/09/2012   BUN 35* 01/09/2012   NA 141 01/09/2012   K 4.2 01/09/2012   CL 102 01/09/2012   CO2 29 01/09/2012     Stable.  Arrange follow up bmet in 2 weeks.

## 2012-01-11 NOTE — Patient Instructions (Addendum)
Your physician recommends that you schedule a follow-up appointment in: DR. WALL IN 2 MONTHS  Your physician recommends that you return for lab work in: 2 WEEKS FOR REPEAT BMET DX HTN, CAD  A REFILL WAS SENT IN TODAY FOR IMDUR 30 MG 1 TABLET DAILY # 30 X 11  YOU HAVE AN APPOINTMENT TO SEE DR. BARTLE TO DISCUSS ABOUT HAVING CABG AND AVR; YOUR APPT IS ON 01/23/12 PLEASE BE THERE @ 3:30 TO FILL OUT PAPER WORK FOR YOUR 4:00 PM APPT WITH THE DR.

## 2012-01-11 NOTE — Assessment & Plan Note (Signed)
Volume stable.  Continue current meds. 

## 2012-01-11 NOTE — Assessment & Plan Note (Signed)
Continue Zocor 

## 2012-01-11 NOTE — Progress Notes (Signed)
[address] [place], [place]  [address] Phone: [phone] Fax:  [phone]  Date:  01/11/2012   Name:  [name] [name]       DOB:  [birth year] MRN:  [id number]  PCP:  Dr. Bufford Spikes Primary Cardiologist:  Dr. Valera Castle  Primary Electrophysiologist:  None    History of Present Illness: [name] [name] is a 78 y.o. male who presents for post hospital follow up.  He has a history of hypertension and aortic stenosis. He was admitted 1/2- 1/11 with an NSTEMI.  He presented with chest pain and EKG changes.  He was taken directly to the cardiac catheterization lab: Ostial left main 20%, ostial LAD 50%, mid 60-70%, ostial D1 40% and mid 40%, D2 70%, ostial circumflex occluded, ostial RCA 80-90%, LVEDP was 42.  He required aggressive IV diuresis post catheterization for pulmonary edema.  He progressed to respiratory failure and was placed on the ventilator.  He developed acute on chronic renal failure.  His ACE inhibitor was discontinued.  Echocardiogram 12/13/11: EF 25-30%, posterolateral akinesis and diffuse hypokinesis, grade 2 diastolic dysfunction, moderate-severe AS, aortic valve area 1.03, mean gradient 16, mild-moderate MR, moderate LAE.  He required tube feedings for nutrition.  He also required physical therapy due to deconditioning.  He was seen by Dr. Laneta Simmers of cardiac surgery.  It was felt that the patient should be allowed to recover over one month prior to proceeding with CABG plus AVR.  Labs: Hemoglobin 11.5, ALT 13, TC 124, TG 69, HDL 47, LDL 63.  Carotid Dopplers 4/12:  0-39% bilaterally.  Follow up labs 01/09/12: Potassium 4.2, BUN 35, creatinine 2.4.  He is doing very well.  Denies chest pain, dyspnea, syncope, near syncope, fatigue, orthopnea, PND, edema.  Weights fairly stable.  He has completed HHPT.  Note from PT reviewed.  He was recently d/c and has progressed nicely.  Vitals have been good and BP noted to be 120/60.    Past Medical History    Diagnosis Date  . HTN (hypertension)   . Stroke 10.2.01  . History of colon cancer   . Aortic stenosis     Echocardiogram 12/13/11: EF 25-30%, posterolateral akinesis and diffuse hypokinesis, grade 2 diastolic dysfunction, moderate-severe AS, aortic valve area 1.03, mean gradient 16, mild-moderate MR, moderate LAE  . CAD (coronary artery disease)     s/p NSTEMI 12/2011:  LHC - Ostial left main 20%, ostial LAD 50%, mid 60-70%, ostial D1 40% and mid 40%, D2 70%, ostial circumflex occluded, ostial RCA 80-90%, LVEDP was 42;  plan eventually for CABG/AVR with Dr. Laneta Simmers  . Ischemic cardiomyopathy   . Chronic systolic heart failure   . CKD (chronic kidney disease)   . HLD (hyperlipidemia)   . DM2 (diabetes mellitus, type 2)     Current Outpatient Prescriptions  Medication Sig Dispense Refill  . aspirin 81 MG chewable tablet Chew 1 tablet (81 mg total) by mouth daily.      . carvedilol (COREG) 25 MG tablet Take 1 tablet (25 mg total) by mouth 2 (two) times daily with a meal.  60 tablet  11  . cholecalciferol (VITAMIN D) 1000 UNITS tablet Take 2,000 Units by mouth daily.        . furosemide (LASIX) 40 MG tablet Take 1 tablet (40 mg total) by mouth 2 (two) times daily.  60 tablet  11  . isosorbide mononitrate (IMDUR) 30 MG 24 hr tablet Take 1 tablet (30 mg total) by mouth daily.  30 tablet  11  . metFORMIN (GLUCOPHAGE) 500 MG tablet 2 tabs morning and 1 tab po qhs       . metipranolol (OPTIPRANOLOL) 0.3 % ophthalmic solution Place 1 drop into both eyes 2 (two) times daily.       . Multiple Vitamin (MULTIVITAMIN) capsule Take 1 capsule by mouth daily.        Marland Kitchen omeprazole (PRILOSEC) 20 MG capsule Take 20 mg by mouth daily.        . simvastatin (ZOCOR) 10 MG tablet Take 10 mg by mouth at bedtime.        . Ticagrelor (BRILINTA) 90 MG TABS tablet Take 1 tablet (90 mg total) by mouth 2 (two) times daily.  60 tablet  11    Allergies: No Known Allergies  History  Substance Use Topics  . Smoking  status: Never Smoker   . Smokeless tobacco: Never Used  . Alcohol Use: No     ROS:  Please see the history of present illness.    All other systems reviewed and negative.   PHYSICAL EXAM: VS:  BP 133/64  Pulse 52  Resp 18  Ht 5\' 7"  (1.702 m)  Wt 139 lb (63.05 kg)  BMI 21.77 kg/m2 Well nourished, well developed, in no acute distress HEENT: normal Neck: no JVD Cardiac:  normal S1, S2; RRR; harsh 2/6 systolic murmur RUSB Lungs:  clear to auscultation bilaterally, no wheezing, rhonchi or rales Abd: soft, nontender, no hepatomegaly Ext: no edema; right wrist without hematoma or bruit Skin: warm and dry Neuro:  CNs 2-12 intact, no focal abnormalities noted  EKG:  Sinus brady, HR 52, normal axis, LVH, no sig change when compared to prior  ASSESSMENT AND PLAN:

## 2012-01-11 NOTE — Assessment & Plan Note (Signed)
Controlled.  Continue current therapy.  

## 2012-01-11 NOTE — Assessment & Plan Note (Signed)
He is not on ACE due to CKD.  Can consider adding hydralazine and nitrates for afterload reduction.  But, with severe AS, do not want to push BP down too low.  Hopefully LVF will recover after CABG and AVR.

## 2012-01-11 NOTE — Assessment & Plan Note (Signed)
Doing well.  No angina.  He has progressed nicely with HHPT since d/c.  Discussed with Dr. Valera Castle.  Will schedule follow up with Dr. Laneta Simmers to discuss timing of CABG/AVR.

## 2012-01-12 DIAGNOSIS — E119 Type 2 diabetes mellitus without complications: Secondary | ICD-10-CM | POA: Diagnosis not present

## 2012-01-12 DIAGNOSIS — I359 Nonrheumatic aortic valve disorder, unspecified: Secondary | ICD-10-CM | POA: Diagnosis not present

## 2012-01-12 DIAGNOSIS — I214 Non-ST elevation (NSTEMI) myocardial infarction: Secondary | ICD-10-CM | POA: Diagnosis not present

## 2012-01-12 DIAGNOSIS — I10 Essential (primary) hypertension: Secondary | ICD-10-CM | POA: Diagnosis not present

## 2012-01-12 DIAGNOSIS — I509 Heart failure, unspecified: Secondary | ICD-10-CM | POA: Diagnosis not present

## 2012-01-12 DIAGNOSIS — I2510 Atherosclerotic heart disease of native coronary artery without angina pectoris: Secondary | ICD-10-CM | POA: Diagnosis not present

## 2012-01-18 ENCOUNTER — Other Ambulatory Visit: Payer: Self-pay | Admitting: *Deleted

## 2012-01-23 ENCOUNTER — Ambulatory Visit (INDEPENDENT_AMBULATORY_CARE_PROVIDER_SITE_OTHER): Payer: Medicare Other | Admitting: Surgery

## 2012-01-23 ENCOUNTER — Encounter: Payer: Self-pay | Admitting: Surgery

## 2012-01-23 VITALS — BP 174/72 | HR 56 | Resp 18 | Ht 68.0 in | Wt 136.0 lb

## 2012-01-23 DIAGNOSIS — I2510 Atherosclerotic heart disease of native coronary artery without angina pectoris: Secondary | ICD-10-CM | POA: Diagnosis not present

## 2012-01-23 DIAGNOSIS — I359 Nonrheumatic aortic valve disorder, unspecified: Secondary | ICD-10-CM

## 2012-01-23 DIAGNOSIS — I350 Nonrheumatic aortic (valve) stenosis: Secondary | ICD-10-CM

## 2012-01-23 NOTE — Progress Notes (Signed)
[address]            [name] [address]          [phone]      HPI:  The patient has a known history of mild to moderate aortic stenosis with left ventricular hypertrophy as well as hypertension, diabetes, and hyperlipidemia. He was in his usual state of health until around midnight on 12/12/2010. While trying to get sleep he developed sudden substernal chest pressure which he never had before. After about 3 hours it was getting worse and he woke his wife up to call [phone] and he was brought to [facility] by EMS. He was given an aspirin as well as nitroglycerin and his chest pain resolved completely. It recurred in the emergency room. Echocardiogram showed ST segment depression in leads V4 through V6 and his initial troponin was 0.31. An echocardiogram showed moderate to severe aortic stenosis with an ejection fraction of 25-30%. He subsequently underwent cardiac catheterization which showed the culprit to be a left circumflex occluded at the origin. The patient also has a significant ostial left main stenosis and I would estimate to be in the range of 60%. The LAD has a 50% ostial stenosis in the mid LAD and is diffusely diseased up to about 70%. The right coronary artery is a dominant vessel with 80 and 90% ostial stenosis. The patient went into pulmonary edema post catheterization and required intubation. I was consulted to see him during his hospitalization and agreed that aortic valve replacement and coronary bypass graft surgery was probably the best treatment for preventing further myocardial ischemia and infarction and progressive congestive heart failure symptoms. Dr. [place] and I felt that it was best to allow him to recover for a month or so after his myocardial infarction before proceeding with surgery. He also has some chronic kidney disease with a creatinine of around 2.2-2.4.  Since discharge the patient said that he has continued to feel better. He is  now walking and exercising daily without chest pain or shortness of breath. He denies any dyspnea or fatigue. His appetite has been good.   Current Outpatient Prescriptions  Medication Sig Dispense Refill  . aspirin 81 MG chewable tablet Chew 1 tablet (81 mg total) by mouth daily.      . carvedilol (COREG) 25 MG tablet Take 1 tablet (25 mg total) by mouth 2 (two) times daily with a meal.  60 tablet  11  . cholecalciferol (VITAMIN D) 1000 UNITS tablet Take 2,000 Units by mouth daily.        . furosemide (LASIX) 40 MG tablet Take 1 tablet (40 mg total) by mouth 2 (two) times daily.  60 tablet  11  . isosorbide mononitrate (IMDUR) 30 MG 24 hr tablet Take 1 tablet (30 mg total) by mouth daily.  30 tablet  11  . metFORMIN (GLUCOPHAGE) 500 MG tablet 2 tabs morning and 1 tab po qhs       . metipranolol (OPTIPRANOLOL) 0.3 % ophthalmic solution Place 1 drop into both eyes 2 (two) times daily.       . Multiple Vitamin (MULTIVITAMIN) capsule Take 1 capsule by mouth daily.        Marland Kitchen omeprazole (PRILOSEC) 20 MG capsule Take 20 mg by mouth daily.        . simvastatin (ZOCOR) 10 MG tablet Take 10 mg by mouth at bedtime.        Marland Kitchen  Ticagrelor (BRILINTA) 90 MG TABS tablet Take 1 tablet (90 mg total) by mouth 2 (two) times daily.  60 tablet  11     Physical Exam:  BP 174/72  Pulse 56  Resp 18  Ht 5\' 8"  (1.727 m)  Wt 136 lb (61.689 kg)  BMI 20.68 kg/m2  SpO2 98% He looks well. Cardiac exam shows a regular rate and rhythm with normal S1 and S2. There is a grade 3/6 harsh systolic murmur over the aorta. Lung exam is clear. There is no peripheral edema.  Diagnostic Tests:  [facility] D[facility]*                *[facility]*                      [address]                     [place], [place] [address]                         [phone]   ------------------------------------------------------------ Transthoracic Echocardiography  Patient:    [name] [name], [name] [name] MR #:        [id number] Study Date: 12/13/2011 Gender:     M Age:        78 Height:     172.7cm Weight:     68kg BSA:        1.36m^2 Pt. Status: Room:       2505    PERFORMING   [place], [facility]  ATTENDING    Valera Castle, MD  SONOGRAPHER  [facility], RDCS  ORDERING     [place], [place] cc:  ------------------------------------------------------------ LV EF: 25% -   30%  ------------------------------------------------------------ Indications:      MI - acute 410.91.  ------------------------------------------------------------ History:   PMH:   Congestive heart failure.  Aortic valve disease.  Risk factors:  Hypertension.  ------------------------------------------------------------ Study Conclusions  - Left ventricle: The cavity size was normal. Wall thickness   was normal. Systolic function was severely reduced. The   estimated ejection fraction was in the range of 25% to   30%. Diffuse hypokinesis. There is akinesis of the   posterolateral myocardium. Features are consistent with a   pseudonormal left ventricular filling pattern, with   concomitant abnormal relaxation and increased filling   pressure (grade 2 diastolic dysfunction). - Aortic valve: Valve mobility was restricted. There was   moderate to severe stenosis. Valve area: 1.03cm^2(VTI).   Valve area: 1.11cm^2 (Vmax). - Mitral valve: Calcified annulus. Mild to moderate   regurgitation. - Left atrium: The atrium was moderately dilated. Transthoracic echocardiography.  M-mode, complete 2D, spectral Doppler, and color Doppler.  Height:  Height: 172.7cm. Height: 68in.  Weight:  Weight: 68kg. Weight: 149.6lb.  Body mass index:  BMI: 22.8kg/m^2.  Body surface area:    BSA: 1.64m^2.  Blood pressure:     120/59.  Patient status:  Inpatient.  Location:  Bedside.  ------------------------------------------------------------  ------------------------------------------------------------ Left ventricle:  The cavity size  was normal. Wall thickness was normal. Systolic function was severely reduced. The estimated ejection fraction was in the range of 25% to 30%. Diffuse hypokinesis.  Regional wall motion abnormalities: There is akinesis of the posterolateral myocardium. Features are consistent with a pseudonormal left ventricular filling pattern, with concomitant abnormal relaxation and increased filling pressure (grade 2 diastolic dysfunction).  ------------------------------------------------------------ Aortic valve:   Moderately calcified leaflets. Valve mobility was restricted.  Doppler:   There was moderate to severe stenosis.    No regurgitation.    VTI ratio of LVOT to aortic valve: 0.3. Valve area: 1.03cm^2(VTI). Indexed valve area: 0.57cm^2/m^2 (VTI). Peak velocity ratio of LVOT to aortic valve: 0.32. Valve area: 1.11cm^2 (Vmax). Indexed valve area: 0.61cm^2/m^2 (Vmax).    Mean gradient: 16mm Hg (S). Peak gradient: 29mm Hg (S).  ------------------------------------------------------------ Aorta:  Aortic root: The aortic root was normal in size.  ------------------------------------------------------------ Mitral valve:   Calcified annulus. Mobility was not restricted.  Doppler:  Transvalvular velocity was within the normal range. There was no evidence for stenosis.  Mild to moderate regurgitation.    Peak gradient: 6mm Hg (D).  ------------------------------------------------------------ Left atrium:  The atrium was moderately dilated.  ------------------------------------------------------------ Right ventricle:  The cavity size was normal. Systolic function was normal.  ------------------------------------------------------------ Pulmonic valve:    Doppler:  Transvalvular velocity was within the normal range. There was no evidence for stenosis.   ------------------------------------------------------------ Tricuspid valve:   Structurally normal valve.    Doppler: Transvalvular  velocity was within the normal range.  Trivial regurgitation.  ------------------------------------------------------------ Right atrium:  The atrium was normal in size.  ------------------------------------------------------------ Pericardium:  There was no pericardial effusion.  ------------------------------------------------------------ Systemic veins: Inferior vena cava: The vessel was normal in size.  ------------------------------------------------------------  2D measurements        Normal  Doppler measurements   Normal Left ventricle                 Left ventricle LVID ED,   50.3 mm     43-52   Ea, lat    6.69 cm/s   ------ chord,                         ann, tiss PLAX                           DP LVID ES,   43.3 mm     23-38   E/Ea, lat  17.6        ------ chord,                         ann, tiss     4 PLAX                           DP FS, chord,   14 %      >29     Ea, med    5.15 cm/s   ------ PLAX                           ann, tiss LVPW, ED   10.6 mm     ------  DP IVS/LVPW   0.99        <1.3    E/Ea, med  22.9        ------ ratio, ED                      ann, tiss     1 Ventricular septum             DP IVS, ED    10.5 mm     ------  LVOT LVOT  Peak vel,  86.5 cm/s   ------ Diam, S      21 mm     ------  S Area       3.46 cm^2   ------  VTI, S     18.5 cm     ------ Aorta                          Aortic valve Root diam,   31 mm     ------  Peak vel,   270 cm/s   ------ ED                             S Left atrium                    Mean vel,   182 cm/s   ------ AP dim       39 mm     ------  S AP dim     2.16 cm/m^2 <2.2    VTI, S     62.4 cm     ------ index                          Mean         16 mm Hg  ------                                gradient,                                S                                Peak         29 mm Hg  ------                                gradient,                                S                                 VTI ratio   0.3        ------                                LVOT/AV                                Area, VTI  1.03 cm^2   ------                                Area index 0.57 cm^2/m ------                                (VTI)           ^  2                                Peak vel   0.32        ------                                ratio,                                LVOT/AV                                Area, Vmax 1.11 cm^2   ------                                Area index 0.61 cm^2/m ------                                (Vmax)          ^2                                Mitral valve                                Peak E vel  118 cm/s   ------                                Peak A vel 93.8 cm/s   ------                                Decelerati  211 ms     150-23                                on time                0                                Peak          6 mm Hg  ------                                gradient,                                D                                Peak E/A    1.3        ------  ratio                                Regurg       37 cm/s   ------                                alias vel,                                PISA                                Max regurg  589 cm/s   ------                                vel                                Regurg VTI  195 cm     ------                                ERO, PISA  0.14 cm^2   ------                                Regurg       27 ml     ------                                vol, PISA                                Right ventricle                                Sa vel,    15.9 cm/s   ------                                lat ann,                                tiss DP   ------------------------------------------------------------ Prepared and Electronically Authenticated by  Olga Millers, MD, [facility] 2013-01-02T14:57:48.950 Procedural  Findings: Hemodynamics: AO 148/82 with a mean of 111 mmHg LV 162 with an EDP of 42 mmHg  Coronary angiography: Coronary dominance: right  Left mainstem: Mild ostial disease of 20%.  Left anterior descending (LAD): 50% ostial disease. The mid LAD is diffusely diseased up to 60-70%. The first diagonal branch has 40% disease at the origin and in the mid vessel. The second diagonal branch has 70% disease.  Left circumflex (LCx): Occluded at the ostium. There is minimal left to left collateral.  Right coronary artery (RCA): This is a dominant vessel. There is an 80-90% ostial stenosis.  Left ventriculography: Not  performed to avoid increased contrast load.  Final Conclusions:   1. Severe 3 vessel obstructive coronary disease. 2. Severely elevated left ventricular filling pressures.   Impression:  The patient has moderate to severe aortic stenosis as well as severe multivessel coronary disease with moderate left ventricular dysfunction status post ST segment elevation MI secondary to left circumflex occlusion. He is recovering well after his myocardial infarction and even though he is 78 years old, or is he is still active and independent. I think his best long-term prognosis is going to be with aortic valve replacement and coronary bypass graft surgery. I think that if he continues medical therapy he is going to present within the next one to 2 years with worsening congestive heart failure symptoms or myocardial ischemia and may not be an operative candidate at that time. His operative risk is increased due to to his age and chronic kidney disease but I think the risk of surgery is still much less than the risk of continued medical therapy. I discussed all this with the patient and his wife. I discussed the operative procedure with the patient and wife including alternatives, benefits and risks; including but not limited to bleeding, blood transfusion, infection, stroke, myocardial infarction,  graft failure, heart block requiring a permanent pacemaker, organ dysfunction, and death.  Margaretmary Dys understands and agrees to proceed.  We will schedule surgery for February 28.  Plan:  We will plan aortic valve replacement and coronary bypass graft surgery on Thursday, 02/08/2012.

## 2012-01-24 ENCOUNTER — Telehealth: Payer: Self-pay

## 2012-01-24 ENCOUNTER — Other Ambulatory Visit: Payer: Self-pay

## 2012-01-24 ENCOUNTER — Other Ambulatory Visit: Payer: Self-pay | Admitting: Surgery

## 2012-01-24 DIAGNOSIS — I359 Nonrheumatic aortic valve disorder, unspecified: Secondary | ICD-10-CM

## 2012-01-24 DIAGNOSIS — I2510 Atherosclerotic heart disease of native coronary artery without angina pectoris: Secondary | ICD-10-CM

## 2012-01-24 NOTE — Telephone Encounter (Signed)
Per Dr. Sharee Pimple request, I spoke with the patient regarding his Brilinta - instructed him to stop taking after the 02/01/12 dose in preparation for his 02/08/12 surgery.

## 2012-01-25 ENCOUNTER — Other Ambulatory Visit (INDEPENDENT_AMBULATORY_CARE_PROVIDER_SITE_OTHER): Payer: Medicare Other | Admitting: *Deleted

## 2012-01-25 DIAGNOSIS — I2510 Atherosclerotic heart disease of native coronary artery without angina pectoris: Secondary | ICD-10-CM

## 2012-01-25 DIAGNOSIS — I10 Essential (primary) hypertension: Secondary | ICD-10-CM | POA: Diagnosis not present

## 2012-01-25 DIAGNOSIS — I359 Nonrheumatic aortic valve disorder, unspecified: Secondary | ICD-10-CM | POA: Diagnosis not present

## 2012-01-25 DIAGNOSIS — I5022 Chronic systolic (congestive) heart failure: Secondary | ICD-10-CM | POA: Diagnosis not present

## 2012-01-25 LAB — BASIC METABOLIC PANEL
CO2: 31 mEq/L (ref 19–32)
Calcium: 9.7 mg/dL (ref 8.4–10.5)
Glucose, Bld: 220 mg/dL — ABNORMAL HIGH (ref 70–99)
Sodium: 141 mEq/L (ref 135–145)

## 2012-01-26 ENCOUNTER — Encounter (HOSPITAL_COMMUNITY): Payer: Self-pay | Admitting: Pharmacy Technician

## 2012-01-26 ENCOUNTER — Telehealth: Payer: Self-pay | Admitting: *Deleted

## 2012-01-26 NOTE — Telephone Encounter (Signed)
Message copied by Tarri Fuller on Fri Jan 26, 2012 11:21 AM ------      Message from: [place], [place] T      Created: Fri Jan 26, 2012  8:23 AM       Stable creatinine      Tereso Newcomer, PA-C  8:22 AM 01/26/2012

## 2012-01-26 NOTE — Telephone Encounter (Signed)
pt notified of lab results and asked for lab results to be sent to surgeon Dr. Laneta Simmers. Danielle Rankin

## 2012-01-30 DIAGNOSIS — I2510 Atherosclerotic heart disease of native coronary artery without angina pectoris: Secondary | ICD-10-CM | POA: Diagnosis not present

## 2012-01-30 DIAGNOSIS — I214 Non-ST elevation (NSTEMI) myocardial infarction: Secondary | ICD-10-CM | POA: Diagnosis not present

## 2012-01-30 DIAGNOSIS — I509 Heart failure, unspecified: Secondary | ICD-10-CM | POA: Diagnosis not present

## 2012-01-30 DIAGNOSIS — I359 Nonrheumatic aortic valve disorder, unspecified: Secondary | ICD-10-CM | POA: Diagnosis not present

## 2012-01-30 DIAGNOSIS — I10 Essential (primary) hypertension: Secondary | ICD-10-CM | POA: Diagnosis not present

## 2012-01-30 DIAGNOSIS — E119 Type 2 diabetes mellitus without complications: Secondary | ICD-10-CM | POA: Diagnosis not present

## 2012-02-06 ENCOUNTER — Inpatient Hospital Stay (HOSPITAL_COMMUNITY)
Admission: RE | Admit: 2012-02-06 | Discharge: 2012-02-06 | Disposition: A | Discharge status: Home / Self Care | Payer: Medicare Other | Source: Ambulatory Visit | Attending: Surgery | Admitting: Surgery

## 2012-02-06 ENCOUNTER — Encounter (HOSPITAL_COMMUNITY)
Admission: RE | Admit: 2012-02-06 | Discharge: 2012-02-06 | Disposition: A | Discharge status: Home / Self Care | Payer: Medicare Other | Source: Ambulatory Visit | Attending: Surgery | Admitting: Surgery

## 2012-02-06 ENCOUNTER — Other Ambulatory Visit: Payer: Self-pay

## 2012-02-06 ENCOUNTER — Ambulatory Visit (HOSPITAL_COMMUNITY)
Admission: RE | Admit: 2012-02-06 | Discharge: 2012-02-06 | Disposition: A | Discharge status: Home / Self Care | Payer: Medicare Other | Source: Ambulatory Visit | Attending: Surgery | Admitting: Surgery

## 2012-02-06 ENCOUNTER — Encounter (HOSPITAL_COMMUNITY): Payer: Self-pay

## 2012-02-06 DIAGNOSIS — Z01811 Encounter for preprocedural respiratory examination: Secondary | ICD-10-CM | POA: Diagnosis not present

## 2012-02-06 DIAGNOSIS — M899 Disorder of bone, unspecified: Secondary | ICD-10-CM | POA: Diagnosis not present

## 2012-02-06 DIAGNOSIS — M949 Disorder of cartilage, unspecified: Secondary | ICD-10-CM | POA: Diagnosis not present

## 2012-02-06 DIAGNOSIS — I2510 Atherosclerotic heart disease of native coronary artery without angina pectoris: Secondary | ICD-10-CM | POA: Diagnosis not present

## 2012-02-06 DIAGNOSIS — M47814 Spondylosis without myelopathy or radiculopathy, thoracic region: Secondary | ICD-10-CM | POA: Diagnosis not present

## 2012-02-06 DIAGNOSIS — Z01810 Encounter for preprocedural cardiovascular examination: Secondary | ICD-10-CM

## 2012-02-06 DIAGNOSIS — I359 Nonrheumatic aortic valve disorder, unspecified: Secondary | ICD-10-CM

## 2012-02-06 HISTORY — DX: Malignant (primary) neoplasm, unspecified: C80.1

## 2012-02-06 HISTORY — DX: Gastro-esophageal reflux disease without esophagitis: K21.9

## 2012-02-06 HISTORY — DX: Chronic kidney disease, unspecified: N18.9

## 2012-02-06 LAB — BLOOD GAS, ARTERIAL
Bicarbonate: 28.3 mEq/L — ABNORMAL HIGH (ref 20.0–24.0)
FIO2: 0.21 %
Patient temperature: 98.6
pH, Arterial: 7.505 — ABNORMAL HIGH (ref 7.350–7.450)

## 2012-02-06 LAB — COMPREHENSIVE METABOLIC PANEL
ALT: 21 U/L (ref 0–53)
AST: 22 U/L (ref 0–37)
Albumin: 3.1 g/dL — ABNORMAL LOW (ref 3.5–5.2)
Alkaline Phosphatase: 53 U/L (ref 39–117)
Glucose, Bld: 79 mg/dL (ref 70–99)
Potassium: 4.3 mEq/L (ref 3.5–5.1)
Sodium: 138 mEq/L (ref 135–145)
Total Protein: 6.2 g/dL (ref 6.0–8.3)

## 2012-02-06 LAB — ABO/RH: ABO/RH(D): O POS

## 2012-02-06 LAB — CBC
Hemoglobin: 11 g/dL — ABNORMAL LOW (ref 13.0–17.0)
MCHC: 34.1 g/dL (ref 30.0–36.0)
RDW: 13.8 % (ref 11.5–15.5)

## 2012-02-06 LAB — SURGICAL PCR SCREEN
MRSA, PCR: NEGATIVE
Staphylococcus aureus: POSITIVE — AB

## 2012-02-06 LAB — URINALYSIS, ROUTINE W REFLEX MICROSCOPIC
Bilirubin Urine: NEGATIVE
Hgb urine dipstick: NEGATIVE
Ketones, ur: NEGATIVE mg/dL
Nitrite: NEGATIVE
Urobilinogen, UA: 0.2 mg/dL (ref 0.0–1.0)

## 2012-02-06 LAB — PROTIME-INR: INR: 1.16 (ref 0.00–1.49)

## 2012-02-06 MED ORDER — DEXTROSE 5 % IV SOLN
750.0000 mg | INTRAVENOUS | Status: DC
  Filled 2012-02-06: qty 750

## 2012-02-06 MED ORDER — DEXMEDETOMIDINE HCL 100 MCG/ML IV SOLN: 0.1000 ug/kg/h | INTRAVENOUS | Status: DC

## 2012-02-06 MED ORDER — EPINEPHRINE HCL 1 MG/ML IJ SOLN
0.5000 ug/min | INTRAVENOUS | Status: DC
  Filled 2012-02-06: qty 4

## 2012-02-06 MED ORDER — MAGNESIUM SULFATE 50 % IJ SOLN
40.0000 meq | INTRAMUSCULAR | Status: DC
  Filled 2012-02-06: qty 10

## 2012-02-06 MED ORDER — SODIUM CHLORIDE 0.9 % IV SOLN
INTRAVENOUS | Status: AC
  Administered 2012-02-07: 70 mL/h via INTRAVENOUS
  Filled 2012-02-06: qty 40

## 2012-02-06 MED ORDER — NITROGLYCERIN IN D5W 200-5 MCG/ML-% IV SOLN
2.0000 ug/min | INTRAVENOUS | Status: AC
  Administered 2012-02-07: 5 ug/min via INTRAVENOUS
  Filled 2012-02-06: qty 250

## 2012-02-06 MED ORDER — POTASSIUM CHLORIDE 2 MEQ/ML IV SOLN
80.0000 meq | INTRAVENOUS | Status: DC
  Filled 2012-02-06: qty 40

## 2012-02-06 MED ORDER — SODIUM CHLORIDE 0.9 % IV SOLN
INTRAVENOUS | Status: DC
  Filled 2012-02-06: qty 40

## 2012-02-06 MED ORDER — VANCOMYCIN HCL 1000 MG IV SOLR
1250.0000 mg | INTRAVENOUS | Status: AC
  Administered 2012-02-07: 1250 mg via INTRAVENOUS
  Filled 2012-02-06: qty 1250

## 2012-02-06 MED ORDER — VERAPAMIL HCL 2.5 MG/ML IV SOLN
INTRAVENOUS | Status: AC
  Administered 2012-02-07: 11:00:00
  Filled 2012-02-06: qty 2.5

## 2012-02-06 MED ORDER — VERAPAMIL HCL 2.5 MG/ML IV SOLN
INTRAVENOUS | Status: DC
  Filled 2012-02-06: qty 2.5

## 2012-02-06 MED ORDER — SODIUM CHLORIDE 0.9 % IV SOLN
0.1000 ug/kg/h | INTRAVENOUS | Status: AC
  Administered 2012-02-07: .2 ug/kg/h via INTRAVENOUS
  Filled 2012-02-06: qty 4

## 2012-02-06 MED ORDER — PHENYLEPHRINE HCL 10 MG/ML IJ SOLN
30.0000 ug/min | INTRAVENOUS | Status: DC
  Filled 2012-02-06: qty 2

## 2012-02-06 MED ORDER — CEFUROXIME SODIUM 1.5 G IJ SOLR
1.5000 g | INTRAMUSCULAR | Status: AC
  Administered 2012-02-07: .75 g via INTRAVENOUS
  Administered 2012-02-07: 1.5 g via INTRAVENOUS
  Filled 2012-02-06: qty 1.5

## 2012-02-06 MED ORDER — DOPAMINE-DEXTROSE 3.2-5 MG/ML-% IV SOLN
2.0000 ug/kg/min | INTRAVENOUS | Status: AC
  Administered 2012-02-07: 2 ug/kg/min via INTRAVENOUS
  Filled 2012-02-06: qty 250

## 2012-02-06 MED ORDER — SODIUM CHLORIDE 0.9 % IV SOLN
INTRAVENOUS | Status: AC
  Administered 2012-02-07: 1 [IU]/h via INTRAVENOUS
  Filled 2012-02-06: qty 1

## 2012-02-06 MED ORDER — METOPROLOL TARTRATE 12.5 MG HALF TABLET
12.5000 mg | ORAL_TABLET | Freq: Once | ORAL | Status: DC
  Filled 2012-02-06: qty 1

## 2012-02-06 MED ORDER — SODIUM CHLORIDE 0.9 % IV SOLN
INTRAVENOUS | Status: DC
  Filled 2012-02-06: qty 1

## 2012-02-06 MED ORDER — NITROGLYCERIN IN D5W 200-5 MCG/ML-% IV SOLN
2.0000 ug/min | INTRAVENOUS | Status: DC
  Filled 2012-02-06: qty 250

## 2012-02-06 MED ORDER — CHLORHEXIDINE GLUCONATE 4 % EX LIQD
30.0000 mL | CUTANEOUS | Status: DC
  Filled 2012-02-06: qty 30

## 2012-02-06 MED ORDER — DOPAMINE-DEXTROSE 3.2-5 MG/ML-% IV SOLN: 2.0000 ug/kg/min | INTRAVENOUS | Status: DC

## 2012-02-06 MED ORDER — SODIUM CHLORIDE 0.9 % IV SOLN: 0.1000 ug/kg/h | INTRAVENOUS | Status: DC

## 2012-02-06 NOTE — H&P (Signed)
[address]            [name] [address]          [phone]      HPI:  The patient has a known history of mild to moderate aortic stenosis with left ventricular hypertrophy as well as hypertension, diabetes, and hyperlipidemia. He was in his usual state of health until around midnight on 12/12/2010. While trying to get sleep he developed sudden substernal chest pressure which he never had before. After about 3 hours it was getting worse and he woke his wife up to call 911 and he was brought to [facility] by EMS. He was given an aspirin as well as nitroglycerin and his chest pain resolved completely. It recurred in the emergency room. Echocardiogram showed ST segment depression in leads V4 through V6 and his initial troponin was 0.31. An echocardiogram showed moderate to severe aortic stenosis with an ejection fraction of 25-30%. He subsequently underwent cardiac catheterization which showed the culprit to be a left circumflex occluded at the origin. The patient also has a significant ostial left main stenosis and I would estimate to be in the range of 60%. The LAD has a 50% ostial stenosis in the mid LAD and is diffusely diseased up to about 70%. The right coronary artery is a dominant vessel with 80 and 90% ostial stenosis. The patient went into pulmonary edema post catheterization and required intubation. I was consulted to see him during his hospitalization and agreed that aortic valve replacement and coronary bypass graft surgery was probably the best treatment for preventing further myocardial ischemia and infarction and progressive congestive heart failure symptoms. Dr. [place] and I felt that it was best to allow him to recover for a month or so after his myocardial infarction before proceeding with surgery. He also has some chronic kidney disease with a creatinine of around 2.2-2.4.  Since discharge the patient said that he has continued to feel better. He is  now walking and exercising daily without chest pain or shortness of breath. He denies any dyspnea or fatigue. His appetite has been good.  Current Outpatient Prescriptions   Medication  Sig  Dispense  Refill   .  aspirin 81 MG chewable tablet  Chew 1 tablet (81 mg total) by mouth daily.     .  carvedilol (COREG) 25 MG tablet  Take 1 tablet (25 mg total) by mouth 2 (two) times daily with a meal.  60 tablet  11   .  cholecalciferol (VITAMIN D) 1000 UNITS tablet  Take 2,000 Units by mouth daily.     .  furosemide (LASIX) 40 MG tablet  Take 1 tablet (40 mg total) by mouth 2 (two) times daily.  60 tablet  11   .  isosorbide mononitrate (IMDUR) 30 MG 24 hr tablet  Take 1 tablet (30 mg total) by mouth daily.  30 tablet  11   .  metFORMIN (GLUCOPHAGE) 500 MG tablet  2 tabs morning and 1 tab po qhs     .  metipranolol (OPTIPRANOLOL) 0.3 % ophthalmic solution  Place 1 drop into both eyes 2 (two) times daily.     .  Multiple Vitamin (MULTIVITAMIN) capsule  Take 1 capsule by mouth daily.     Marland Kitchen  omeprazole (PRILOSEC) 20 MG capsule  Take 20 mg by mouth daily.     Marland Kitchen  simvastatin (ZOCOR) 10 MG tablet  Take 10 mg by mouth at bedtime.     .  Ticagrelor (BRILINTA) 90 MG TABS tablet  Take 1 tablet (90 mg total) by mouth 2 (two) times daily.  60 tablet  11     Past Medical History   Diagnosis  Date   .  HYPERTENSION, HEART UNCONTROLLED W/O ASSOC CHF  02/03/2009   .  Stroke, no residual  10.2.01   .  History of colon cancer     .  Heart murmur     .  Hypertension         Past Surgical History   Procedure  Date   .  Colon resection  1996   .  Esophageal dilation x 3     .  Colonoscopy     .  Cataract extraction         Family History   Problem  Relation  Age of Onset   .  Cancer       .  Heart attack       .  Diabetes       .  Heart failure       .  Cancer  Mother     .  Heart attack  Father       Social History:  reports that he has never smoked. He has never used smokeless tobacco. He reports  that he does not drink alcohol or use illicit drugs.  Allergies: No Known Allergies  Review of Systems  Constitutional: Negative for fever, chills, weight loss and malaise/fatigue.  HENT: Positive for tinnitus.   Eyes: Negative.   Respiratory: Negative for cough, hemoptysis, sputum production and shortness of breath.   Cardiovascular: Positive for chest pain. Negative for palpitations, orthopnea, claudication, leg swelling and PND.  Gastrointestinal: Negative.   Genitourinary: Negative.   Musculoskeletal: Negative.   Skin: Negative.   Neurological: Negative.   Endo/Heme/Allergies: Does not bruise/bleed easily.  Psychiatric/Behavioral: Negative.    Blood pressure 121/67, pulse 65, temperature 97.5 F (36.4 C), temperature source Oral, resp. rate 18, height 5\' 8"  (1.727 m), weight 63.095 kg (139 lb 1.6 oz), SpO2 98.00%. Physical Exam  Constitutional: He is oriented to person, place, and time. He appears well-developed and well-nourished. No distress.  HENT:   Head: Normocephalic and atraumatic.   Mouth/Throat: Oropharynx is clear and moist. No oropharyngeal exudate.  Eyes: EOM are normal. Pupils are equal, round, and reactive to light.  Neck: Neck supple. No JVD present. No thyromegaly present.  Cardiovascular: Normal rate, regular rhythm, intact distal pulses and normal pulses.  Exam reveals no gallop and no friction rub.    Murmur heard.      2/6 systolic ejection murmur over aorta  Respiratory: Breath sounds normal.  GI: Soft. Bowel sounds are normal. He exhibits no mass. There is no tenderness.  Musculoskeletal: He exhibits no edema.  Lymphadenopathy:    He has no cervical adenopathy.  Neurological: He is alert and oriented to person, place, and time. He has normal strength. No cranial nerve deficit or sensory deficit.  Skin: Skin is warm and dry.  Psychiatric: He has a normal mood and affect.     Diagnostic studies:  Cardiac Cath:        Procedural  Findings: Hemodynamics: AO 148/82 with a mean of 111 mmHg LV 162 with an EDP of 42 mmHg  Coronary angiography: Coronary dominance: right  Left mainstem: Mild ostial disease of 20%.  Left anterior descending (  LAD): 50% ostial disease. The mid LAD is diffusely diseased up to 60-70%. The first diagonal branch has 40% disease at the origin and in the mid vessel. The second diagonal branch has 70% disease.  Left circumflex (LCx): Occluded at the ostium. There is minimal left to left collateral.  Right coronary artery (RCA): This is a dominant vessel. There is an 80-90% ostial stenosis.  Left ventriculography: Not performed to avoid increased contrast load.  Final Conclusions:   1. Severe 3 vessel obstructive coronary disease. 2. Severely elevated left ventricular filling pressures.    [facility]*                *[facility]*                      [address]                     [place], [place] [address]                         [phone]   ------------------------------------------------------------ Transthoracic Echocardiography  Patient:    [name] [name], [name] [name] MR #:       [id number] Study Date: 12/13/2011 Gender:     M Age:        78 Height:     172.7cm Weight:     68kg BSA:        1.58m^2 Pt. Status: Room:       2505    PERFORMING   [place], [facility]  ATTENDING    Valera Castle, MD  SONOGRAPHER  [facility], RDCS  ORDERING     [place], [place] cc:  ------------------------------------------------------------ LV EF: 25% -   30%  ------------------------------------------------------------ Indications:      MI - acute 410.91.  ------------------------------------------------------------ History:   PMH:   Congestive heart failure.  Aortic valve disease.  Risk factors:  Hypertension.  ------------------------------------------------------------ Study Conclusions  - Left ventricle: The cavity size was normal. Wall thickness    was normal. Systolic function was severely reduced. The   estimated ejection fraction was in the range of 25% to   30%. Diffuse hypokinesis. There is akinesis of the   posterolateral myocardium. Features are consistent with a   pseudonormal left ventricular filling pattern, with   concomitant abnormal relaxation and increased filling   pressure (grade 2 diastolic dysfunction). - Aortic valve: Valve mobility was restricted. There was   moderate to severe stenosis. Valve area: 1.03cm^2(VTI).   Valve area: 1.11cm^2 (Vmax). - Mitral valve: Calcified annulus. Mild to moderate   regurgitation. - Left atrium: The atrium was moderately dilated. Transthoracic echocardiography.  M-mode, complete 2D, spectral Doppler, and color Doppler.  Height:  Height: 172.7cm. Height: 68in.  Weight:  Weight: 68kg. Weight: 149.6lb.  Body mass index:  BMI: 22.8kg/m^2.  Body surface area:    BSA: 1.41m^2.  Blood pressure:     120/59.  Patient status:  Inpatient.  Location:  Bedside.  ------------------------------------------------------------  ------------------------------------------------------------ Left ventricle:  The cavity size was normal. Wall thickness was normal. Systolic function was severely reduced. The estimated ejection fraction was in the range of 25% to 30%. Diffuse hypokinesis.  Regional wall motion abnormalities: There is akinesis of the posterolateral myocardium. Features are consistent with a pseudonormal left ventricular filling pattern, with concomitant abnormal relaxation and increased filling pressure (grade 2 diastolic dysfunction).  ------------------------------------------------------------ Aortic valve:   Moderately calcified leaflets. Valve mobility was restricted.  Doppler:   There was moderate to severe stenosis.    No regurgitation.    VTI ratio of LVOT to aortic valve: 0.3. Valve area: 1.03cm^2(VTI). Indexed valve area: 0.57cm^2/m^2 (VTI). Peak velocity ratio of LVOT to  aortic valve: 0.32. Valve area: 1.11cm^2 (Vmax). Indexed valve area: 0.61cm^2/m^2 (Vmax).    Mean gradient: 16mm Hg (S). Peak gradient: 29mm Hg (S).  ------------------------------------------------------------ Aorta:  Aortic root: The aortic root was normal in size.  ------------------------------------------------------------ Mitral valve:   Calcified annulus. Mobility was not restricted.  Doppler:  Transvalvular velocity was within the normal range. There was no evidence for stenosis.  Mild to moderate regurgitation.    Peak gradient: 6mm Hg (D).  ------------------------------------------------------------ Left atrium:  The atrium was moderately dilated.  ------------------------------------------------------------ Right ventricle:  The cavity size was normal. Systolic function was normal.  ------------------------------------------------------------ Pulmonic valve:    Doppler:  Transvalvular velocity was within the normal range. There was no evidence for stenosis.   ------------------------------------------------------------ Tricuspid valve:   Structurally normal valve.    Doppler: Transvalvular velocity was within the normal range.  Trivial regurgitation.  ------------------------------------------------------------ Right atrium:  The atrium was normal in size.  ------------------------------------------------------------ Pericardium:  There was no pericardial effusion.  ------------------------------------------------------------ Systemic veins: Inferior vena cava: The vessel was normal in size.  ------------------------------------------------------------  2D measurements        Normal  Doppler measurements   Normal Left ventricle                 Left ventricle LVID ED,   50.3 mm     43-52   Ea, lat    6.69 cm/s   ------ chord,                         ann, tiss PLAX                           DP LVID ES,   43.3 mm     23-38   E/Ea, lat  17.6        ------ chord,                          ann, tiss     4 PLAX                           DP FS, chord,   14 %      >29     Ea, med    5.15 cm/s   ------ PLAX                           ann, tiss LVPW, ED   10.6 mm     ------  DP IVS/LVPW   0.99        <1.3    E/Ea, med  22.9        ------ ratio, ED                      ann, tiss     1 Ventricular septum             DP IVS, ED    10.5 mm     ------  LVOT LVOT  Peak vel,  86.5 cm/s   ------ Diam, S      21 mm     ------  S Area       3.46 cm^2   ------  VTI, S     18.5 cm     ------ Aorta                          Aortic valve Root diam,   31 mm     ------  Peak vel,   270 cm/s   ------ ED                             S Left atrium                    Mean vel,   182 cm/s   ------ AP dim       39 mm     ------  S AP dim     2.16 cm/m^2 <2.2    VTI, S     62.4 cm     ------ index                          Mean         16 mm Hg  ------                                gradient,                                S                                Peak         29 mm Hg  ------                                gradient,                                S                                VTI ratio   0.3        ------                                LVOT/AV                                Area, VTI  1.03 cm^2   ------                                Area index 0.57 cm^2/m ------                                (VTI)           ^  2                                Peak vel   0.32        ------                                ratio,                                LVOT/AV                                Area, Vmax 1.11 cm^2   ------                                Area index 0.61 cm^2/m ------                                (Vmax)          ^2                                Mitral valve                                Peak E vel  118 cm/s   ------                                Peak A vel 93.8 cm/s   ------                                Decelerati  211 ms      150-23                                on time                0                                Peak          6 mm Hg  ------                                gradient,                                D                                Peak E/A    1.3        ------  ratio                                Regurg       37 cm/s   ------                                alias vel,                                PISA                                Max regurg  589 cm/s   ------                                vel                                Regurg VTI  195 cm     ------                                ERO, PISA  0.14 cm^2   ------                                Regurg       27 ml     ------                                vol, PISA                                Right ventricle                                Sa vel,    15.9 cm/s   ------                                lat ann,                                tiss DP   ------------------------------------------------------------ Prepared and Electronically Authenticated by  Olga Millers, MD, [facility] 2013-01-02T14:57:[id number]   Impression:  The patient has moderate to severe aortic stenosis as well as severe multivessel coronary disease with moderate left ventricular dysfunction status post ST segment elevation MI secondary to left circumflex occlusion. He is recovering well after his myocardial infarction and even though he is 78 years old, or is he is still active and independent. I think his best long-term prognosis is going to be with aortic valve replacement and coronary bypass graft surgery. I think that if he continues medical therapy he is going to present within the next one to 2 years with worsening congestive heart failure symptoms or myocardial ischemia and  may not be an operative candidate at that time. His operative risk is increased due to to his age and chronic kidney disease but I think the risk of surgery is  still much less than the risk of continued medical therapy. I discussed all this with the patient and his wife. I discussed the operative procedure with the patient and wife including alternatives, benefits and risks; including but not limited to bleeding, blood transfusion, infection, stroke, myocardial infarction, graft failure, heart block requiring a permanent pacemaker, organ dysfunction, and death. [name] [name] understands and agrees to proceed. We will schedule surgery for February 28.    Plan:   We will plan aortic valve replacement and coronary bypass graft surgery on Wednesday 02/07/12.

## 2012-02-06 NOTE — Progress Notes (Signed)
Pre-op Cardiac Surgery  Carotid Findings:  No significant ICA stenosis with antegrade vertebral flow bilaterally.  Upper Extremity Right Left  Brachial Pressures 143 141  Radial Waveforms Tri Tri  Ulnar Waveforms Tri Tri  Palmar Arch (Allen's Test) Waveform decreases >50% with radial compression and remains normal with ulnar compression Waveform decreases >50% with radial compression and remains normal with ulnar compression    Lower  Extremity Right Left  Dorsalis Pedis Not obtained 185, Biphasic  Peroneal 53, Monophasic   Posterior Tibial 82, Monophasic 166, Monophasic  Ankle/Brachial Indices 0.57 1.29    Findings:  Right ABI shows moderate disease. Left ABI is normal, but may be falsely elevated.   Farrel Demark , RDMS

## 2012-02-06 NOTE — Progress Notes (Addendum)
Echo 03/23/11 and 12/13/11 cath 12/13/11 in epic Dopplers in epic,,pfi's on chart

## 2012-02-06 NOTE — Pre-Procedure Instructions (Addendum)
20 [name] [name]  02/06/2012   Your procedure is scheduled on: 2.27.13  Report to [facility] at 630  AM.  Call this number if you have problems the morning of surgery: [phone]   Remember:   Do not eat food:After Midnight.  May have clear liquids: up to 4 Hours before arrival.230 am  Clear liquids include soda, tea, black coffee, apple or grape juice, broth.  Take these medicines the morning of surgery with A SIP OF WATER: carvedilol, imdur, omeprazole, aspirin              STOP   Brilinta, multivit  Do not wear jewelry, make-up or nail polish.  Do not wear lotions, powders, or perfumes. You may wear deodorant.  Do not shave 48 hours prior to surgery.  Do not bring valuables to the hospital.  Contacts, dentures or bridgework may not be worn into surgery.  Leave suitcase in the car. After surgery it may be brought to your room.  For patients admitted to the hospital, checkout time is 11:00 AM the day of discharge.   Patients discharged the day of surgery will not be allowed to drive home.  Name and phone number of your driver:roberta [phone]   Special Instructions: Incentive Spirometry - Practice and bring it with you on the day of surgery. and CHG Shower Use Special Wash: 1/2 bottle night before surgery and 1/2 bottle morning of surgery.   Please read over the following fact sheets that you were given: Pain Booklet, Coughing and Deep Breathing, Blood Transfusion Information, Open Heart Packet, MRSA Information and Surgical Site Infection Prevention

## 2012-02-07 ENCOUNTER — Encounter (HOSPITAL_COMMUNITY): Payer: Self-pay | Admitting: Anesthesiology

## 2012-02-07 ENCOUNTER — Encounter: Payer: Self-pay | Admitting: Cardiology

## 2012-02-07 ENCOUNTER — Inpatient Hospital Stay (HOSPITAL_COMMUNITY)
Admission: RE | Admit: 2012-02-07 | Discharge: 2012-02-15 | DRG: 220 | Disposition: A | Discharge status: Home / Self Care | Payer: Medicare Other | Source: Ambulatory Visit | Attending: Surgery | Admitting: Surgery

## 2012-02-07 ENCOUNTER — Other Ambulatory Visit: Payer: Self-pay | Admitting: *Deleted

## 2012-02-07 ENCOUNTER — Encounter (HOSPITAL_COMMUNITY): Payer: Self-pay

## 2012-02-07 ENCOUNTER — Encounter (HOSPITAL_COMMUNITY)
Admission: RE | Disposition: A | Discharge status: Home / Self Care | Payer: Self-pay | Source: Ambulatory Visit | Attending: Surgery

## 2012-02-07 ENCOUNTER — Inpatient Hospital Stay (HOSPITAL_COMMUNITY): Payer: Medicare Other

## 2012-02-07 ENCOUNTER — Ambulatory Visit (HOSPITAL_COMMUNITY): Payer: Medicare Other | Admitting: Anesthesiology

## 2012-02-07 DIAGNOSIS — I129 Hypertensive chronic kidney disease with stage 1 through stage 4 chronic kidney disease, or unspecified chronic kidney disease: Secondary | ICD-10-CM | POA: Diagnosis present

## 2012-02-07 DIAGNOSIS — E119 Type 2 diabetes mellitus without complications: Secondary | ICD-10-CM | POA: Diagnosis present

## 2012-02-07 DIAGNOSIS — Z951 Presence of aortocoronary bypass graft: Secondary | ICD-10-CM | POA: Diagnosis not present

## 2012-02-07 DIAGNOSIS — I252 Old myocardial infarction: Secondary | ICD-10-CM | POA: Diagnosis not present

## 2012-02-07 DIAGNOSIS — R339 Retention of urine, unspecified: Secondary | ICD-10-CM | POA: Diagnosis not present

## 2012-02-07 DIAGNOSIS — J811 Chronic pulmonary edema: Secondary | ICD-10-CM | POA: Diagnosis not present

## 2012-02-07 DIAGNOSIS — I97190 Other postprocedural cardiac functional disturbances following cardiac surgery: Secondary | ICD-10-CM | POA: Diagnosis present

## 2012-02-07 DIAGNOSIS — J90 Pleural effusion, not elsewhere classified: Secondary | ICD-10-CM | POA: Diagnosis present

## 2012-02-07 DIAGNOSIS — R918 Other nonspecific abnormal finding of lung field: Secondary | ICD-10-CM | POA: Diagnosis not present

## 2012-02-07 DIAGNOSIS — I10 Essential (primary) hypertension: Secondary | ICD-10-CM | POA: Diagnosis not present

## 2012-02-07 DIAGNOSIS — D62 Acute posthemorrhagic anemia: Secondary | ICD-10-CM | POA: Diagnosis not present

## 2012-02-07 DIAGNOSIS — I2510 Atherosclerotic heart disease of native coronary artery without angina pectoris: Principal | ICD-10-CM | POA: Diagnosis present

## 2012-02-07 DIAGNOSIS — Z23 Encounter for immunization: Secondary | ICD-10-CM

## 2012-02-07 DIAGNOSIS — Z01812 Encounter for preprocedural laboratory examination: Secondary | ICD-10-CM

## 2012-02-07 DIAGNOSIS — E785 Hyperlipidemia, unspecified: Secondary | ICD-10-CM | POA: Diagnosis present

## 2012-02-07 DIAGNOSIS — J984 Other disorders of lung: Secondary | ICD-10-CM | POA: Diagnosis not present

## 2012-02-07 DIAGNOSIS — N189 Chronic kidney disease, unspecified: Secondary | ICD-10-CM | POA: Diagnosis present

## 2012-02-07 DIAGNOSIS — Z954 Presence of other heart-valve replacement: Secondary | ICD-10-CM | POA: Diagnosis not present

## 2012-02-07 DIAGNOSIS — I359 Nonrheumatic aortic valve disorder, unspecified: Secondary | ICD-10-CM | POA: Diagnosis present

## 2012-02-07 DIAGNOSIS — J9819 Other pulmonary collapse: Secondary | ICD-10-CM | POA: Diagnosis not present

## 2012-02-07 DIAGNOSIS — R0602 Shortness of breath: Secondary | ICD-10-CM | POA: Diagnosis not present

## 2012-02-07 HISTORY — PX: CORONARY ARTERY BYPASS GRAFT: SHX141

## 2012-02-07 HISTORY — PX: AORTIC VALVE REPLACEMENT: SHX41

## 2012-02-07 LAB — POCT I-STAT 4, (NA,K, GLUC, HGB,HCT)
Glucose, Bld: 108 mg/dL — ABNORMAL HIGH (ref 70–99)
Glucose, Bld: 132 mg/dL — ABNORMAL HIGH (ref 70–99)
Glucose, Bld: 112 mg/dL — ABNORMAL HIGH (ref 70–99)
Glucose, Bld: 124 mg/dL — ABNORMAL HIGH (ref 70–99)
HCT: 23 % — ABNORMAL LOW (ref 39.0–52.0)
HCT: 21 % — ABNORMAL LOW (ref 39.0–52.0)
HCT: 20 % — ABNORMAL LOW (ref 39.0–52.0)
Hemoglobin: 8.8 g/dL — ABNORMAL LOW (ref 13.0–17.0)
Hemoglobin: 7.8 g/dL — ABNORMAL LOW (ref 13.0–17.0)
Hemoglobin: 7.1 g/dL — ABNORMAL LOW (ref 13.0–17.0)
Hemoglobin: 6.8 g/dL — CL (ref 13.0–17.0)
Hemoglobin: 8.5 g/dL — ABNORMAL LOW (ref 13.0–17.0)
Potassium: 4.4 mEq/L (ref 3.5–5.1)
Potassium: 3.9 mEq/L (ref 3.5–5.1)
Potassium: 5.5 mEq/L — ABNORMAL HIGH (ref 3.5–5.1)
Sodium: 136 mEq/L (ref 135–145)
Sodium: 133 mEq/L — ABNORMAL LOW (ref 135–145)
Sodium: 137 mEq/L (ref 135–145)

## 2012-02-07 LAB — POCT I-STAT 3, ART BLOOD GAS (G3+)
Acid-base deficit: 1 mmol/L (ref 0.0–2.0)
Acid-base deficit: 1 mmol/L (ref 0.0–2.0)
Bicarbonate: 31.1 mEq/L — ABNORMAL HIGH (ref 20.0–24.0)
Bicarbonate: 27.4 mEq/L — ABNORMAL HIGH (ref 20.0–24.0)
Bicarbonate: 23.3 mEq/L (ref 20.0–24.0)
Bicarbonate: 24.4 mEq/L — ABNORMAL HIGH (ref 20.0–24.0)
Bicarbonate: 24.7 mEq/L — ABNORMAL HIGH (ref 20.0–24.0)
O2 Saturation: 100 %
O2 Saturation: 99 %
Patient temperature: 35.1
Patient temperature: 38.2
TCO2: 32 mmol/L (ref 0–100)
TCO2: 28 mmol/L (ref 0–100)
TCO2: 26 mmol/L (ref 0–100)
pCO2 arterial: 42.5 mmHg (ref 35.0–45.0)
pCO2 arterial: 34.7 mmHg — ABNORMAL LOW (ref 35.0–45.0)
pCO2 arterial: 24.3 mmHg — ABNORMAL LOW (ref 35.0–45.0)
pCO2 arterial: 44 mmHg (ref 35.0–45.0)
pH, Arterial: 7.473 — ABNORMAL HIGH (ref 7.350–7.450)
pH, Arterial: 7.583 — ABNORMAL HIGH (ref 7.350–7.450)
pO2, Arterial: 55 mmHg — ABNORMAL LOW (ref 80.0–100.0)
pO2, Arterial: 173 mmHg — ABNORMAL HIGH (ref 80.0–100.0)
pO2, Arterial: 142 mmHg — ABNORMAL HIGH (ref 80.0–100.0)

## 2012-02-07 LAB — PROTIME-INR
INR: 1.78 — ABNORMAL HIGH (ref 0.00–1.49)
Prothrombin Time: 21 seconds — ABNORMAL HIGH (ref 11.6–15.2)

## 2012-02-07 LAB — POCT I-STAT, CHEM 8
BUN: 27 mg/dL — ABNORMAL HIGH (ref 6–23)
Calcium, Ion: 1.12 mmol/L (ref 1.12–1.32)
Chloride: 104 mEq/L (ref 96–112)
Creatinine, Ser: 1.8 mg/dL — ABNORMAL HIGH (ref 0.50–1.35)
TCO2: 23 mmol/L (ref 0–100)

## 2012-02-07 LAB — CBC
HCT: 25.6 % — ABNORMAL LOW (ref 39.0–52.0)
Hemoglobin: 8.8 g/dL — ABNORMAL LOW (ref 13.0–17.0)
MCH: 31.6 pg (ref 26.0–34.0)
MCH: 31.9 pg (ref 26.0–34.0)
MCHC: 34.7 g/dL (ref 30.0–36.0)
MCV: 92.8 fL (ref 78.0–100.0)
Platelets: 102 10*3/uL — ABNORMAL LOW (ref 150–400)
RBC: 2.76 MIL/uL — ABNORMAL LOW (ref 4.22–5.81)
RDW: 15 % (ref 11.5–15.5)
WBC: 5.3 10*3/uL (ref 4.0–10.5)

## 2012-02-07 LAB — GLUCOSE, CAPILLARY
Glucose-Capillary: 84 mg/dL (ref 70–99)
Glucose-Capillary: 80 mg/dL (ref 70–99)
Glucose-Capillary: 115 mg/dL — ABNORMAL HIGH (ref 70–99)
Glucose-Capillary: 155 mg/dL — ABNORMAL HIGH (ref 70–99)

## 2012-02-07 LAB — HEMOGLOBIN AND HEMATOCRIT, BLOOD: Hemoglobin: 7.3 g/dL — ABNORMAL LOW (ref 13.0–17.0)

## 2012-02-07 LAB — FIBRINOGEN: Fibrinogen: 156 mg/dL — ABNORMAL LOW (ref 204–475)

## 2012-02-07 SURGERY — REPLACEMENT, AORTIC VALVE, OPEN
Anesthesia: General | Site: Chest | Wound class: Clean

## 2012-02-07 MED ORDER — SODIUM CHLORIDE 0.9 % IV SOLN: 250.0000 mL | INTRAVENOUS | Status: DC

## 2012-02-07 MED ORDER — BISACODYL 10 MG RE SUPP: 10.0000 mg | Freq: Every day | RECTAL | Status: DC

## 2012-02-07 MED ORDER — TIMOLOL MALEATE 0.5 % OP SOLN
1.0000 [drp] | Freq: Two times a day (BID) | OPHTHALMIC | Status: DC
  Administered 2012-02-07 – 2012-02-15 (×16): 1 [drp] via OPHTHALMIC
  Filled 2012-02-07: qty 5

## 2012-02-07 MED ORDER — ACETAMINOPHEN 160 MG/5ML PO SOLN: 650.0000 mg | ORAL | Status: AC

## 2012-02-07 MED ORDER — POTASSIUM CHLORIDE 10 MEQ/50ML IV SOLN
10.0000 meq | INTRAVENOUS | Status: AC
  Administered 2012-02-07 (×2): 10 meq via INTRAVENOUS

## 2012-02-07 MED ORDER — ASPIRIN 81 MG PO CHEW: 324.0000 mg | CHEWABLE_TABLET | Freq: Every day | ORAL | Status: DC

## 2012-02-07 MED ORDER — SODIUM CHLORIDE 0.9 % IV SOLN
INTRAVENOUS | Status: DC
  Administered 2012-02-07: 20 mL/h via INTRAVENOUS

## 2012-02-07 MED ORDER — LACTATED RINGERS IV SOLN
INTRAVENOUS | Status: DC | PRN
  Administered 2012-02-07 (×2): via INTRAVENOUS

## 2012-02-07 MED ORDER — SODIUM CHLORIDE 0.9 % IR SOLN
Status: DC | PRN
  Administered 2012-02-07: 7000 mL

## 2012-02-07 MED ORDER — METOPROLOL TARTRATE 12.5 MG HALF TABLET
12.5000 mg | ORAL_TABLET | Freq: Two times a day (BID) | ORAL | Status: DC
  Filled 2012-02-07 (×3): qty 1

## 2012-02-07 MED ORDER — SIMVASTATIN 10 MG PO TABS
10.0000 mg | ORAL_TABLET | Freq: Every day | ORAL | Status: DC
  Administered 2012-02-08 – 2012-02-14 (×7): 10 mg via ORAL
  Filled 2012-02-07 (×9): qty 1

## 2012-02-07 MED ORDER — HEMOSTATIC AGENTS (NO CHARGE) OPTIME
TOPICAL | Status: DC | PRN
  Administered 2012-02-07: 3 via TOPICAL

## 2012-02-07 MED ORDER — VANCOMYCIN HCL 1000 MG IV SOLR
1000.0000 mg | Freq: Once | INTRAVENOUS | Status: AC
  Administered 2012-02-07: 1000 mg via INTRAVENOUS
  Filled 2012-02-07: qty 1000

## 2012-02-07 MED ORDER — ARTIFICIAL TEARS OP OINT
TOPICAL_OINTMENT | OPHTHALMIC | Status: DC | PRN
  Administered 2012-02-07: 1 via OPHTHALMIC

## 2012-02-07 MED ORDER — SODIUM CHLORIDE 0.9 % IV SOLN
250.0000 mL | INTRAVENOUS | Status: DC
  Administered 2012-02-07: 20 mL/h via INTRAVENOUS

## 2012-02-07 MED ORDER — NITROGLYCERIN IN D5W 200-5 MCG/ML-% IV SOLN
0.0000 ug/min | INTRAVENOUS | Status: DC
  Administered 2012-02-07: 70 ug/min via INTRAVENOUS
  Filled 2012-02-07: qty 250

## 2012-02-07 MED ORDER — LACTATED RINGERS IV SOLN: 500.0000 mL | Freq: Once | INTRAVENOUS | Status: AC | PRN

## 2012-02-07 MED ORDER — HEPARIN SODIUM (PORCINE) 1000 UNIT/ML IJ SOLN
INTRAMUSCULAR | Status: DC | PRN
  Administered 2012-02-07: 15000 [IU] via INTRAVENOUS

## 2012-02-07 MED ORDER — SODIUM CHLORIDE 0.9 % IJ SOLN
3.0000 mL | Freq: Two times a day (BID) | INTRAMUSCULAR | Status: DC
  Administered 2012-02-08 (×2): 3 mL via INTRAVENOUS

## 2012-02-07 MED ORDER — METOPROLOL TARTRATE 25 MG/10 ML ORAL SUSPENSION
12.5000 mg | Freq: Two times a day (BID) | ORAL | Status: DC
  Filled 2012-02-07 (×3): qty 5

## 2012-02-07 MED ORDER — VECURONIUM BROMIDE 10 MG IV SOLR
INTRAVENOUS | Status: DC | PRN
  Administered 2012-02-07: 10 mg via INTRAVENOUS
  Administered 2012-02-07 (×2): 5 mg via INTRAVENOUS
  Administered 2012-02-07: 10 mg via INTRAVENOUS

## 2012-02-07 MED ORDER — ACETAMINOPHEN 650 MG RE SUPP
650.0000 mg | RECTAL | Status: AC
  Administered 2012-02-07: 650 mg via RECTAL

## 2012-02-07 MED ORDER — ONDANSETRON HCL 4 MG/2ML IJ SOLN
4.0000 mg | Freq: Four times a day (QID) | INTRAMUSCULAR | Status: DC | PRN
  Filled 2012-02-07: qty 2

## 2012-02-07 MED ORDER — PANTOPRAZOLE SODIUM 40 MG PO TBEC: 40.0000 mg | DELAYED_RELEASE_TABLET | Freq: Every day | ORAL | Status: DC

## 2012-02-07 MED ORDER — SODIUM CHLORIDE 0.45 % IV SOLN
INTRAVENOUS | Status: DC
  Administered 2012-02-07: 20 mL/h via INTRAVENOUS

## 2012-02-07 MED ORDER — ACETAMINOPHEN 160 MG/5ML PO SOLN
975.0000 mg | Freq: Four times a day (QID) | ORAL | Status: DC
  Administered 2012-02-07: 975 mg
  Filled 2012-02-07 (×2): qty 40.6

## 2012-02-07 MED ORDER — ASPIRIN EC 325 MG PO TBEC
325.0000 mg | DELAYED_RELEASE_TABLET | Freq: Every day | ORAL | Status: DC
  Filled 2012-02-07 (×2): qty 1

## 2012-02-07 MED ORDER — LACTATED RINGERS IV SOLN: INTRAVENOUS | Status: DC

## 2012-02-07 MED ORDER — BISACODYL 5 MG PO TBEC: 10.0000 mg | DELAYED_RELEASE_TABLET | Freq: Every day | ORAL | Status: DC

## 2012-02-07 MED ORDER — DOCUSATE SODIUM 100 MG PO CAPS: 200.0000 mg | ORAL_CAPSULE | Freq: Every day | ORAL | Status: DC

## 2012-02-07 MED ORDER — SODIUM CHLORIDE 0.9 % IJ SOLN: 3.0000 mL | INTRAMUSCULAR | Status: DC | PRN

## 2012-02-07 MED ORDER — MUPIROCIN 2 % EX OINT
TOPICAL_OINTMENT | CUTANEOUS | Status: AC
  Administered 2012-02-07: 1 via NASAL
  Filled 2012-02-07: qty 22

## 2012-02-07 MED ORDER — ROCURONIUM BROMIDE 100 MG/10ML IV SOLN
INTRAVENOUS | Status: DC | PRN
  Administered 2012-02-07: 50 mg via INTRAVENOUS

## 2012-02-07 MED ORDER — PHENYLEPHRINE HCL 10 MG/ML IJ SOLN
0.0000 ug/min | INTRAVENOUS | Status: DC
  Filled 2012-02-07: qty 2

## 2012-02-07 MED ORDER — HETASTARCH-ELECTROLYTES 6 % IV SOLN
INTRAVENOUS | Status: DC | PRN
  Administered 2012-02-07: 14:00:00 via INTRAVENOUS

## 2012-02-07 MED ORDER — MORPHINE SULFATE 2 MG/ML IJ SOLN
1.0000 mg | INTRAMUSCULAR | Status: AC | PRN
  Administered 2012-02-07 (×2): 2 mg via INTRAVENOUS
  Filled 2012-02-07 (×2): qty 1

## 2012-02-07 MED ORDER — SODIUM CHLORIDE 0.9 % IV SOLN
0.1000 ug/kg/h | INTRAVENOUS | Status: DC
  Filled 2012-02-07 (×2): qty 2

## 2012-02-07 MED ORDER — HEMOSTATIC AGENTS (NO CHARGE) OPTIME
TOPICAL | Status: DC | PRN
  Administered 2012-02-07: 1 via TOPICAL

## 2012-02-07 MED ORDER — VITAMIN D3 25 MCG (1000 UNIT) PO TABS
2000.0000 [IU] | ORAL_TABLET | Freq: Every day | ORAL | Status: DC
  Administered 2012-02-09: 1000 [IU] via ORAL
  Administered 2012-02-10 – 2012-02-15 (×6): 2000 [IU] via ORAL
  Filled 2012-02-07 (×8): qty 2

## 2012-02-07 MED ORDER — FENTANYL CITRATE 0.05 MG/ML IJ SOLN
INTRAMUSCULAR | Status: DC | PRN
  Administered 2012-02-07: 150 ug via INTRAVENOUS
  Administered 2012-02-07: 250 ug via INTRAVENOUS
  Administered 2012-02-07: 700 ug via INTRAVENOUS
  Administered 2012-02-07: 250 ug via INTRAVENOUS
  Administered 2012-02-07: 100 ug via INTRAVENOUS
  Administered 2012-02-07: 50 ug via INTRAVENOUS

## 2012-02-07 MED ORDER — LACTATED RINGERS IV SOLN
INTRAVENOUS | Status: DC | PRN
  Administered 2012-02-07 (×2): via INTRAVENOUS

## 2012-02-07 MED ORDER — ETOMIDATE 2 MG/ML IV SOLN
INTRAVENOUS | Status: DC | PRN
  Administered 2012-02-07: 8 mg via INTRAVENOUS

## 2012-02-07 MED ORDER — INSULIN REGULAR HUMAN 100 UNIT/ML IJ SOLN
INTRAMUSCULAR | Status: DC
  Filled 2012-02-07: qty 1

## 2012-02-07 MED ORDER — INSULIN ASPART 100 UNIT/ML ~~LOC~~ SOLN
0.0000 [IU] | SUBCUTANEOUS | Status: DC
  Administered 2012-02-08 (×2): 2 [IU] via SUBCUTANEOUS
  Administered 2012-02-08: 12 [IU] via SUBCUTANEOUS
  Filled 2012-02-07: qty 3

## 2012-02-07 MED ORDER — MIDAZOLAM HCL 5 MG/5ML IJ SOLN
INTRAMUSCULAR | Status: DC | PRN
  Administered 2012-02-07: 4 mg via INTRAVENOUS
  Administered 2012-02-07: 1 mg via INTRAVENOUS
  Administered 2012-02-07: 3 mg via INTRAVENOUS
  Administered 2012-02-07: 2 mg via INTRAVENOUS

## 2012-02-07 MED ORDER — MORPHINE SULFATE 4 MG/ML IJ SOLN
2.0000 mg | INTRAMUSCULAR | Status: DC | PRN
  Administered 2012-02-08 (×3): 2 mg via INTRAVENOUS
  Filled 2012-02-07 (×2): qty 1

## 2012-02-07 MED ORDER — ALBUMIN HUMAN 5 % IV SOLN: 250.0000 mL | INTRAVENOUS | Status: AC | PRN

## 2012-02-07 MED ORDER — MIDAZOLAM HCL 2 MG/2ML IJ SOLN: 2.0000 mg | INTRAMUSCULAR | Status: DC | PRN

## 2012-02-07 MED ORDER — PROTAMINE SULFATE 10 MG/ML IV SOLN
INTRAVENOUS | Status: DC | PRN
  Administered 2012-02-07: 150 mg via INTRAVENOUS

## 2012-02-07 MED ORDER — THROMBIN 20000 UNITS EX KIT
PACK | CUTANEOUS | Status: DC | PRN
  Administered 2012-02-07: 20000 [IU] via TOPICAL

## 2012-02-07 MED ORDER — MUPIROCIN 2 % EX OINT
TOPICAL_OINTMENT | Freq: Two times a day (BID) | CUTANEOUS | Status: DC
Start: 2012-02-07 — End: 2012-02-15
  Administered 2012-02-07: 1 via NASAL
  Administered 2012-02-07 – 2012-02-15 (×16): via NASAL
  Filled 2012-02-07: qty 22

## 2012-02-07 MED ORDER — ACETAMINOPHEN 500 MG PO TABS
1000.0000 mg | ORAL_TABLET | Freq: Four times a day (QID) | ORAL | Status: DC
  Administered 2012-02-08 – 2012-02-09 (×3): 1000 mg via ORAL
  Filled 2012-02-07 (×8): qty 2

## 2012-02-07 MED ORDER — INSULIN REGULAR BOLUS VIA INFUSION
0.0000 [IU] | Freq: Three times a day (TID) | INTRAVENOUS | Status: DC
  Administered 2012-02-07: 0 [IU] via INTRAVENOUS
  Filled 2012-02-07: qty 10

## 2012-02-07 MED ORDER — FAMOTIDINE IN NACL 20-0.9 MG/50ML-% IV SOLN
20.0000 mg | Freq: Two times a day (BID) | INTRAVENOUS | Status: AC
  Administered 2012-02-07 – 2012-02-08 (×2): 20 mg via INTRAVENOUS
  Filled 2012-02-07: qty 50

## 2012-02-07 MED ORDER — DEXTROSE 5 % IV SOLN
1.5000 g | Freq: Two times a day (BID) | INTRAVENOUS | Status: DC
  Administered 2012-02-07 – 2012-02-08 (×3): 1.5 g via INTRAVENOUS
  Filled 2012-02-07 (×5): qty 1.5

## 2012-02-07 MED ORDER — LACTATED RINGERS IV SOLN
INTRAVENOUS | Status: DC | PRN
  Administered 2012-02-07: 08:00:00 via INTRAVENOUS

## 2012-02-07 MED ORDER — METOPROLOL TARTRATE 1 MG/ML IV SOLN: 2.5000 mg | INTRAVENOUS | Status: DC | PRN

## 2012-02-07 MED ORDER — INSULIN ASPART 100 UNIT/ML ~~LOC~~ SOLN
0.0000 [IU] | SUBCUTANEOUS | Status: AC
  Administered 2012-02-07 (×2): 2 [IU] via SUBCUTANEOUS
  Filled 2012-02-07: qty 3

## 2012-02-07 MED ORDER — OXYCODONE HCL 5 MG PO TABS: 5.0000 mg | ORAL_TABLET | ORAL | Status: DC | PRN

## 2012-02-07 MED ORDER — ALBUMIN HUMAN 5 % IV SOLN
INTRAVENOUS | Status: DC | PRN
  Administered 2012-02-07 (×2): via INTRAVENOUS

## 2012-02-07 SURGICAL SUPPLY — 125 items
ADAPTER CARDIO PERF ANTE/RETRO (ADAPTER) ×2 IMPLANT
ATTRACTOMAT 16X20 MAGNETIC DRP (DRAPES) ×2 IMPLANT
BAG DECANTER FOR FLEXI CONT (MISCELLANEOUS) ×2 IMPLANT
BANDAGE ELASTIC 4 VELCRO ST LF (GAUZE/BANDAGES/DRESSINGS) ×2 IMPLANT
BANDAGE ELASTIC 6 VELCRO ST LF (GAUZE/BANDAGES/DRESSINGS) ×2 IMPLANT
BANDAGE GAUZE ELAST BULKY 4 IN (GAUZE/BANDAGES/DRESSINGS) ×2 IMPLANT
BASKET HEART (ORDER IN 25'S) (MISCELLANEOUS) ×1
BASKET HEART (ORDER IN 25S) (MISCELLANEOUS) ×1 IMPLANT
BENZOIN TINCTURE PRP APPL 2/3 (GAUZE/BANDAGES/DRESSINGS) ×2 IMPLANT
BLADE SAW STERNAL (BLADE) ×2 IMPLANT
BLADE SURG 11 STRL SS (BLADE) ×2 IMPLANT
BLADE SURG 15 STRL LF DISP TIS (BLADE) ×1 IMPLANT
BLADE SURG 15 STRL SS (BLADE) ×1
BLADE SURG ROTATE 9660 (MISCELLANEOUS) IMPLANT
CANISTER SUCTION 2500CC (MISCELLANEOUS) ×2 IMPLANT
CANNULA GUNDRY RCSP 15FR (MISCELLANEOUS) ×2 IMPLANT
CATH ROBINSON RED A/P 18FR (CATHETERS) ×8 IMPLANT
CATH THORACIC 28FR (CATHETERS) ×2 IMPLANT
CATH THORACIC 28FR RT ANG (CATHETERS) IMPLANT
CATH THORACIC 36FR (CATHETERS) ×2 IMPLANT
CATH THORACIC 36FR RT ANG (CATHETERS) ×2 IMPLANT
CLIP FOGARTY SPRING 6M (CLIP) IMPLANT
CLIP TI MEDIUM 24 (CLIP) IMPLANT
CLIP TI WIDE RED SMALL 24 (CLIP) ×2 IMPLANT
CLOTH BEACON ORANGE TIMEOUT ST (SAFETY) ×2 IMPLANT
CONT SPEC 4OZ CLIKSEAL STRL BL (MISCELLANEOUS) ×2 IMPLANT
CONT SPEC STER OR (MISCELLANEOUS) ×2 IMPLANT
COVER SURGICAL LIGHT HANDLE (MISCELLANEOUS) ×4 IMPLANT
CRADLE DONUT ADULT HEAD (MISCELLANEOUS) ×2 IMPLANT
DRAPE CARDIOVASCULAR INCISE (DRAPES) ×2
DRAPE SLUSH MACHINE 52X66 (DRAPES) IMPLANT
DRAPE SLUSH/WARMER DISC (DRAPES) ×2 IMPLANT
DRAPE SRG 135X102X78XABS (DRAPES) ×1 IMPLANT
DRSG COVADERM 4X14 (GAUZE/BANDAGES/DRESSINGS) ×2 IMPLANT
ELECT CAUTERY BLADE 6.4 (BLADE) ×2 IMPLANT
ELECT REM PT RETURN 9FT ADLT (ELECTROSURGICAL) ×4
ELECTRODE REM PT RTRN 9FT ADLT (ELECTROSURGICAL) ×2 IMPLANT
GAUZE KERLIX 2  STERILE LF (GAUZE/BANDAGES/DRESSINGS) ×2 IMPLANT
GLOVE BIO SURGEON STRL SZ 6 (GLOVE) ×4 IMPLANT
GLOVE BIO SURGEON STRL SZ 6.5 (GLOVE) ×6 IMPLANT
GLOVE BIO SURGEON STRL SZ7 (GLOVE) IMPLANT
GLOVE BIO SURGEON STRL SZ7.5 (GLOVE) IMPLANT
GLOVE BIOGEL PI IND STRL 6 (GLOVE) ×2 IMPLANT
GLOVE BIOGEL PI IND STRL 6.5 (GLOVE) IMPLANT
GLOVE BIOGEL PI IND STRL 7.0 (GLOVE) ×2 IMPLANT
GLOVE BIOGEL PI INDICATOR 6 (GLOVE) ×2
GLOVE BIOGEL PI INDICATOR 6.5 (GLOVE)
GLOVE BIOGEL PI INDICATOR 7.0 (GLOVE) ×2
GLOVE EUDERMIC 7 POWDERFREE (GLOVE) ×4 IMPLANT
GLOVE ORTHO TXT STRL SZ7.5 (GLOVE) IMPLANT
GOWN PREVENTION PLUS XLARGE (GOWN DISPOSABLE) ×2 IMPLANT
GOWN STRL NON-REIN LRG LVL3 (GOWN DISPOSABLE) ×12 IMPLANT
HEART VENT LT CURVED (MISCELLANEOUS) ×2 IMPLANT
HEMOSTAT POWDER SURGIFOAM 1G (HEMOSTASIS) ×6 IMPLANT
HEMOSTAT SURGICEL 2X14 (HEMOSTASIS) ×2 IMPLANT
INSERT FOGARTY 61MM (MISCELLANEOUS) IMPLANT
INSERT FOGARTY XLG (MISCELLANEOUS) IMPLANT
KIT BASIN OR (CUSTOM PROCEDURE TRAY) ×4 IMPLANT
KIT CATH CPB BARTLE (MISCELLANEOUS) ×2 IMPLANT
KIT ROOM TURNOVER OR (KITS) ×2 IMPLANT
KIT SUCTION CATH 14FR (SUCTIONS) ×2 IMPLANT
KIT VASOVIEW W/TROCAR VH 2000 (KITS) ×2 IMPLANT
LINE EXTENSION DELIVERY (MISCELLANEOUS) ×2 IMPLANT
NS IRRIG 1000ML POUR BTL (IV SOLUTION) ×12 IMPLANT
PACK OPEN HEART (CUSTOM PROCEDURE TRAY) ×2 IMPLANT
PAD ARMBOARD 7.5X6 YLW CONV (MISCELLANEOUS) ×4 IMPLANT
PENCIL BUTTON HOLSTER BLD 10FT (ELECTRODE) ×2 IMPLANT
PUNCH AORTIC ROTATE 4.0MM (MISCELLANEOUS) ×2 IMPLANT
PUNCH AORTIC ROTATE 4.5MM 8IN (MISCELLANEOUS) ×2 IMPLANT
PUNCH AORTIC ROTATE 5MM 8IN (MISCELLANEOUS) IMPLANT
SOLUTION ANTI FOG 6CC (MISCELLANEOUS) IMPLANT
SPONGE GAUZE 4X4 12PLY (GAUZE/BANDAGES/DRESSINGS) ×4 IMPLANT
SPONGE INTESTINAL PEANUT (DISPOSABLE) IMPLANT
SPONGE LAP 18X18 X RAY DECT (DISPOSABLE) IMPLANT
SPONGE LAP 4X18 X RAY DECT (DISPOSABLE) ×2 IMPLANT
STRIP CLOSURE SKIN 1/2X4 (GAUZE/BANDAGES/DRESSINGS) ×2 IMPLANT
SUT BONE WAX W31G (SUTURE) ×2 IMPLANT
SUT ETHIBON 2 0 V 52N 30 (SUTURE) ×4 IMPLANT
SUT ETHIBON EXCEL 2-0 V-5 (SUTURE) IMPLANT
SUT ETHIBOND 2 0 SH (SUTURE) ×2
SUT ETHIBOND 2 0 SH 36X2 (SUTURE) ×1 IMPLANT
SUT ETHIBOND 2 0 V4 (SUTURE) IMPLANT
SUT ETHIBOND 2 0V4 GREEN (SUTURE) IMPLANT
SUT ETHIBOND 4 0 RB 1 (SUTURE) IMPLANT
SUT ETHIBOND V-5 VALVE (SUTURE) IMPLANT
SUT MNCRL AB 4-0 PS2 18 (SUTURE) IMPLANT
SUT PROLENE 3 0 SH 1 (SUTURE) ×2 IMPLANT
SUT PROLENE 3 0 SH DA (SUTURE) ×2 IMPLANT
SUT PROLENE 3 0 SH1 36 (SUTURE) IMPLANT
SUT PROLENE 4 0 RB 1 (SUTURE) ×6
SUT PROLENE 4 0 SH DA (SUTURE) IMPLANT
SUT PROLENE 4-0 RB1 .5 CRCL 36 (SUTURE) ×6 IMPLANT
SUT PROLENE 5 0 C 1 36 (SUTURE) IMPLANT
SUT PROLENE 6 0 C 1 30 (SUTURE) IMPLANT
SUT PROLENE 6 0 CC (SUTURE) IMPLANT
SUT PROLENE 7 0 BV 1 (SUTURE) IMPLANT
SUT PROLENE 7 0 BV1 MDA (SUTURE) ×4 IMPLANT
SUT PROLENE 7.0 RB 3 (SUTURE) ×2 IMPLANT
SUT PROLENE 8 0 BV175 6 (SUTURE) IMPLANT
SUT SILK  1 MH (SUTURE)
SUT SILK 1 MH (SUTURE) IMPLANT
SUT SILK 2 0 SH CR/8 (SUTURE) IMPLANT
SUT SILK 3 0 SH CR/8 (SUTURE) IMPLANT
SUT STEEL 6MS V (SUTURE) ×4 IMPLANT
SUT STEEL STERNAL CCS#1 18IN (SUTURE) IMPLANT
SUT STEEL SZ 6 DBL 3X14 BALL (SUTURE) IMPLANT
SUT VIC AB 1 CTX 36 (SUTURE) ×2
SUT VIC AB 1 CTX36XBRD ANBCTR (SUTURE) ×2 IMPLANT
SUT VIC AB 2-0 CT1 27 (SUTURE) ×2
SUT VIC AB 2-0 CT1 TAPERPNT 27 (SUTURE) ×1 IMPLANT
SUT VIC AB 2-0 CTX 27 (SUTURE) IMPLANT
SUT VIC AB 3-0 SH 27 (SUTURE)
SUT VIC AB 3-0 SH 27X BRD (SUTURE) IMPLANT
SUT VIC AB 3-0 X1 27 (SUTURE) IMPLANT
SUT VICRYL 4-0 PS2 18IN ABS (SUTURE) ×2 IMPLANT
SUTURE E-PAK OPEN HEART (SUTURE) ×2 IMPLANT
SYSTEM SAHARA CHEST DRAIN ATS (WOUND CARE) ×2 IMPLANT
TOWEL OR 17X24 6PK STRL BLUE (TOWEL DISPOSABLE) ×4 IMPLANT
TOWEL OR 17X26 10 PK STRL BLUE (TOWEL DISPOSABLE) ×4 IMPLANT
TRAY FOLEY IC TEMP SENS 14FR (CATHETERS) ×2 IMPLANT
TUBE SUCT INTRACARD DLP 20F (MISCELLANEOUS) ×2 IMPLANT
TUBING INSUFFLATION 10FT LAP (TUBING) ×2 IMPLANT
UNDERPAD 30X30 INCONTINENT (UNDERPADS AND DIAPERS) ×2 IMPLANT
VALVE MAGNA EASE AORTIC 23MM (Prosthesis & Implant Heart) ×2 IMPLANT
WATER STERILE IRR 1000ML POUR (IV SOLUTION) ×4 IMPLANT

## 2012-02-07 NOTE — Progress Notes (Signed)
                   [address]            [place] [address]          [phone]    BP 106/46  Pulse 75  Temp(Src) 101.1 F (38.4 C) (Oral)  Resp 14  Wt 145 lb 8.1 oz (66 kg)  SpO2 100%  PAP: (21-40)/(12-23) 34/14 mmHg CO:  [3.2 L/min-4 L/min] 3.9 L/min CI:  [1.8 L/min/m2-2.5 L/min/m2] 2.5 L/min/m2     . sodium chloride 20 mL/hr (02/07/12 1445)  . sodium chloride 20 mL/hr (02/07/12 1445)  . sodium chloride 20 mL/hr (02/07/12 1615)  . dexmedetomidine (PRECEDEX) IV infusion 0.3 mcg/kg/hr (02/07/12 1700)  . nitroGLYCERIN 40 mcg/min (02/07/12 2049)  . phenylephrine (NEO-SYNEPHRINE) Adult infusion 0 mcg/min (02/07/12 1445)  . DISCONTD: sodium chloride    . DISCONTD: insulin (NOVOLIN-R) infusion 1.1 Units/hr (02/07/12 1445)  . DISCONTD: lactated ringers      Not bleeding Extubated  Alert neuro intact  Delight Ovens MD  Beeper [phone] Office [phone] 02/07/2012 9:27 PM

## 2012-02-07 NOTE — Preoperative (Signed)
Beta Blockers   Reason not to administer Beta Blockers:Not Applicable 

## 2012-02-07 NOTE — Brief Op Note (Signed)
02/07/2012  2:17 PM  PATIENT:  Margaretmary Dys  78 y.o. male  PRE-OPERATIVE DIAGNOSIS:  CAD, AORTIC STENOSIS  POST-OPERATIVE DIAGNOSIS:  CAD, AORTIC STENOSIS  PROCEDURE:  Procedure(s) (LRB): AORTIC VALVE REPLACEMENT (AVR) (N/A) CORONARY ARTERY BYPASS GRAFTING (CABG) (N/A)  LIMA to LAD, SVG to D2, SVG to PDA.  EVH right leg.  SURGEON:  Surgeon(s) and Role: [name], MD - Primary  PHYSICIAN ASSISTANT: Coral Ceo, [facility]   ANESTHESIA:   general  Off bypass on dopamine 2 mcg/kg/min.

## 2012-02-07 NOTE — Procedures (Signed)
Extubation Procedure Note  Patient Details:   Name: [name] [name] DOB: 01/14/1926 MRN: [id number]   Airway Documentation:  AIRWAYS 8.5 mm (Active)  Secured at (cm) 21 cm 02/07/2012 12:00 AM    Evaluation  O2 sats: stable throughout Complications: No apparent complications Patient did tolerate procedure well. Bilateral Breath Sounds: Diminished   Yes PT had Nif of -20, VC of over 1000 ml, and able to lift head for at least 5 seconds. PT extubated to 3 lpm nasal cannula and spo2 is 98% ABG    Component Value Date/Time   PHART 7.369 02/07/2012 2008   PCO2ART 42.9 02/07/2012 2008   PO2ART 173.0* 02/07/2012 2008   HCO3 24.4* 02/07/2012 2008   TCO2 23 02/07/2012 2012   ACIDBASEDEF 1.0 02/07/2012 2008   O2SAT 99.0 02/07/2012 2008     [name] 02/07/2012, 8:46 PM

## 2012-02-07 NOTE — Anesthesia Postprocedure Evaluation (Signed)
  Anesthesia Post-op Note  Patient: [name] [name]  Procedure(s) Performed: Procedure(s) (LRB): AORTIC VALVE REPLACEMENT (AVR) (N/A) CORONARY ARTERY BYPASS GRAFTING (CABG) (N/A)  Patient Location: SICU  Anesthesia Type: General  Level of Consciousness: sedated  Airway and Oxygen Therapy: Patient remains intubated per anesthesia plan  Post-op Pain: none  Post-op Assessment: Post-op Vital signs reviewed, Patient's Cardiovascular Status Stable and Respiratory Function Stable  Post-op Vital Signs: Reviewed and stable  Complications: No apparent anesthesia complications

## 2012-02-07 NOTE — Transfer of Care (Signed)
Immediate Anesthesia Transfer of Care Note  Patient: Paz Winsett  Procedure(s) Performed: Procedure(s) (LRB): AORTIC VALVE REPLACEMENT (AVR) (N/A) CORONARY ARTERY BYPASS GRAFTING (CABG) (N/A)  Patient Location: PACU and SICU  Anesthesia Type: General  Level of Consciousness: sedated, unresponsive and Patient remains intubated per anesthesia plan  Airway & Oxygen Therapy: Patient remains intubated per anesthesia plan and Patient placed on Ventilator (see vital sign flow sheet for setting)  Post-op Assessment: Report given to PACU RN and Post -op Vital signs reviewed and stable  Post vital signs: Reviewed and stable  Complications: No apparent anesthesia complications

## 2012-02-07 NOTE — Anesthesia Preprocedure Evaluation (Addendum)
Anesthesia Evaluation  Patient identified by MRN, date of birth, ID band Patient awake    Reviewed: Allergy & Precautions, H&P , NPO status , Patient's Chart, lab work & pertinent test results, reviewed documented beta blocker date and time   History of Anesthesia Complications Negative for: history of anesthetic complications  Airway Mallampati: II TM Distance: >3 FB Neck ROM: Full    Dental  (+) Edentulous Upper, Lower Dentures and Dental Advisory Given   Pulmonary neg pulmonary ROS,  clear to auscultation        Cardiovascular Exercise Tolerance: Poor hypertension, Pt. on medications and Pt. on home beta blockers + angina with exertion + CAD, + Past MI (MI 12/13/11; card cath with immediate CHF requiring intubation), +CHF and + DOE + Valvular Problems/Murmurs AS Regular Bradycardia+ Systolic murmurs EF 25-30% with severe AS, mild MR   Neuro/Psych CVA, No Residual Symptoms Negative Psych ROS   GI/Hepatic Neg liver ROS, GERD-  Medicated and Controlled,  Endo/Other  Diabetes mellitus-, Type 2, Oral Hypoglycemic Agents  Renal/GU Renal InsufficiencyRenal disease  Genitourinary negative   Musculoskeletal negative musculoskeletal ROS (+)   Abdominal   Peds  Hematology negative hematology ROS (+)   Anesthesia Other Findings   Reproductive/Obstetrics negative OB ROS                          Anesthesia Physical Anesthesia Plan  ASA: IV  Anesthesia Plan: General   Post-op Pain Management:    Induction: Intravenous  Airway Management Planned: Oral ETT  Additional Equipment: Arterial line, 3D TEE, CVP, Ultrasound Guidance Line Placement and PA Cath  Intra-op Plan:   Post-operative Plan: Post-operative intubation/ventilation  Informed Consent:   Dental advisory given  Plan Discussed with: Anesthesiologist  Anesthesia Plan Comments:         Anesthesia Quick Evaluation

## 2012-02-07 NOTE — OR Nursing (Signed)
Small amount of blood noted on rectal temperature probe upon removal at end of surgery.

## 2012-02-07 NOTE — Interval H&P Note (Signed)
History and Physical Interval Note:  02/07/2012 8:30 AM  [name] [name]  has presented today for surgery, with the diagnosis of CAD, AORTIC STENOSIS  The various methods of treatment have been discussed with the patient and family. After consideration of risks, benefits and other options for treatment, the patient has consented to  Procedure(s) (LRB): AORTIC VALVE REPLACEMENT (AVR) (N/A) CORONARY ARTERY BYPASS GRAFTING (CABG) (N/A) as a surgical intervention .  The patients' history has been reviewed, patient examined, no change in status, stable for surgery.  I have reviewed the patients' chart and labs.  Questions were answered to the patient's satisfaction.     [name]

## 2012-02-07 NOTE — OR Nursing (Signed)
13:15pm - 1st call to SICU; call to volunteer desk to inform family off pump

## 2012-02-07 NOTE — Progress Notes (Signed)
  Echocardiogram Echocardiogram Transesophageal has been performed.  Mercy Moore 02/07/2012, 9:24 AM

## 2012-02-08 ENCOUNTER — Encounter: Payer: Self-pay | Admitting: Cardiology

## 2012-02-08 ENCOUNTER — Encounter (HOSPITAL_COMMUNITY): Payer: Self-pay | Admitting: Surgery

## 2012-02-08 ENCOUNTER — Inpatient Hospital Stay (HOSPITAL_COMMUNITY): Payer: Medicare Other

## 2012-02-08 DIAGNOSIS — I2510 Atherosclerotic heart disease of native coronary artery without angina pectoris: Principal | ICD-10-CM

## 2012-02-08 DIAGNOSIS — J811 Chronic pulmonary edema: Secondary | ICD-10-CM | POA: Diagnosis not present

## 2012-02-08 DIAGNOSIS — J9819 Other pulmonary collapse: Secondary | ICD-10-CM | POA: Diagnosis not present

## 2012-02-08 DIAGNOSIS — R918 Other nonspecific abnormal finding of lung field: Secondary | ICD-10-CM | POA: Diagnosis not present

## 2012-02-08 LAB — PREPARE FRESH FROZEN PLASMA: Unit division: 0

## 2012-02-08 LAB — TYPE AND SCREEN
ABO/RH(D): O POS
Antibody Screen: NEGATIVE
Unit division: 0

## 2012-02-08 LAB — BASIC METABOLIC PANEL
BUN: 24 mg/dL — ABNORMAL HIGH (ref 6–23)
Calcium: 7.2 mg/dL — ABNORMAL LOW (ref 8.4–10.5)
Creatinine, Ser: 1.51 mg/dL — ABNORMAL HIGH (ref 0.50–1.35)
GFR calc Af Amer: 47 mL/min — ABNORMAL LOW (ref >90)
GFR calc non Af Amer: 41 mL/min — ABNORMAL LOW (ref >90)
Potassium: 3.6 mEq/L (ref 3.5–5.1)

## 2012-02-08 LAB — CBC
HCT: 20.3 % — ABNORMAL LOW (ref 39.0–52.0)
HCT: 27.2 % — ABNORMAL LOW (ref 39.0–52.0)
HCT: 30.5 % — ABNORMAL LOW (ref 39.0–52.0)
MCH: 31.6 pg (ref 26.0–34.0)
MCH: 31.4 pg (ref 26.0–34.0)
MCH: 31.6 pg (ref 26.0–34.0)
MCHC: 33.5 g/dL (ref 30.0–36.0)
MCHC: 33.5 g/dL (ref 30.0–36.0)
MCHC: 33.4 g/dL (ref 30.0–36.0)
MCV: 94.4 fL (ref 78.0–100.0)
MCV: 94.4 fL (ref 78.0–100.0)
RDW: 15.5 % (ref 11.5–15.5)
RDW: 15.6 % — ABNORMAL HIGH (ref 11.5–15.5)
RDW: 15.5 % (ref 11.5–15.5)

## 2012-02-08 LAB — PREPARE PLATELET PHERESIS: Unit division: 0

## 2012-02-08 LAB — GLUCOSE, CAPILLARY
Glucose-Capillary: 157 mg/dL — ABNORMAL HIGH (ref 70–99)
Glucose-Capillary: 138 mg/dL — ABNORMAL HIGH (ref 70–99)
Glucose-Capillary: 267 mg/dL — ABNORMAL HIGH (ref 70–99)
Glucose-Capillary: 61 mg/dL — ABNORMAL LOW (ref 70–99)

## 2012-02-08 LAB — POCT I-STAT, CHEM 8
Calcium, Ion: 1.2 mmol/L (ref 1.12–1.32)
Chloride: 104 mEq/L (ref 96–112)
Glucose, Bld: 205 mg/dL — ABNORMAL HIGH (ref 70–99)
HCT: 30 % — ABNORMAL LOW (ref 39.0–52.0)
Hemoglobin: 10.2 g/dL — ABNORMAL LOW (ref 13.0–17.0)
TCO2: 25 mmol/L (ref 0–100)

## 2012-02-08 MED ORDER — SODIUM CHLORIDE 0.9 % IV SOLN: INTRAVENOUS | Status: DC

## 2012-02-08 MED ORDER — METOCLOPRAMIDE HCL 5 MG/ML IJ SOLN
5.0000 mg | Freq: Four times a day (QID) | INTRAMUSCULAR | Status: AC
  Administered 2012-02-08 – 2012-02-09 (×4): 5 mg via INTRAVENOUS
  Filled 2012-02-08 (×4): qty 1

## 2012-02-08 MED ORDER — INSULIN ASPART 100 UNIT/ML ~~LOC~~ SOLN
0.0000 [IU] | SUBCUTANEOUS | Status: DC
  Administered 2012-02-08: 2 [IU] via SUBCUTANEOUS
  Filled 2012-02-08: qty 3

## 2012-02-08 MED ORDER — INSULIN ASPART 100 UNIT/ML ~~LOC~~ SOLN: 0.0000 [IU] | SUBCUTANEOUS | Status: DC

## 2012-02-08 MED ORDER — FUROSEMIDE 10 MG/ML IJ SOLN
80.0000 mg | Freq: Once | INTRAMUSCULAR | Status: AC
  Administered 2012-02-08: 80 mg via INTRAVENOUS
  Filled 2012-02-08: qty 8

## 2012-02-08 MED ORDER — INSULIN GLARGINE 100 UNIT/ML ~~LOC~~ SOLN
10.0000 [IU] | SUBCUTANEOUS | Status: DC
  Administered 2012-02-08: 10 [IU] via SUBCUTANEOUS
  Filled 2012-02-08: qty 3

## 2012-02-08 MED ORDER — CARVEDILOL 6.25 MG PO TABS
6.2500 mg | ORAL_TABLET | Freq: Two times a day (BID) | ORAL | Status: DC
  Administered 2012-02-08 – 2012-02-13 (×11): 6.25 mg via ORAL
  Filled 2012-02-08 (×13): qty 1

## 2012-02-08 MED ORDER — POTASSIUM CHLORIDE 10 MEQ/50ML IV SOLN
INTRAVENOUS | Status: AC
  Administered 2012-02-08: 10 meq via INTRAVENOUS
  Filled 2012-02-08: qty 200

## 2012-02-08 MED ORDER — SODIUM CHLORIDE 0.9 % IV SOLN
INTRAVENOUS | Status: DC
  Administered 2012-02-08: 6.4 [IU]/h via INTRAVENOUS
  Filled 2012-02-08: qty 1

## 2012-02-08 MED ORDER — INSULIN ASPART 100 UNIT/ML ~~LOC~~ SOLN: 3.0000 [IU] | Freq: Three times a day (TID) | SUBCUTANEOUS | Status: DC

## 2012-02-08 MED ORDER — INSULIN GLARGINE 100 UNIT/ML ~~LOC~~ SOLN: 12.0000 [IU] | Freq: Two times a day (BID) | SUBCUTANEOUS | Status: DC

## 2012-02-08 MED ORDER — POTASSIUM CHLORIDE 10 MEQ/50ML IV SOLN
10.0000 meq | INTRAVENOUS | Status: AC
  Administered 2012-02-08 (×3): 10 meq via INTRAVENOUS

## 2012-02-08 MED FILL — Magnesium Sulfate Inj 50%: INTRAMUSCULAR | Qty: 10 | Status: AC

## 2012-02-08 MED FILL — Verapamil HCl IV Soln 2.5 MG/ML: INTRAVENOUS | Qty: 4 | Status: AC

## 2012-02-08 MED FILL — Mupirocin Oint 2%: CUTANEOUS | Qty: 22 | Status: AC

## 2012-02-08 MED FILL — Potassium Chloride Inj 2 mEq/ML: INTRAVENOUS | Qty: 40 | Status: AC

## 2012-02-08 MED FILL — Nitroglycerin IV Soln 5 MG/ML: INTRAVENOUS | Qty: 10 | Status: AC

## 2012-02-08 MED FILL — Lactated Ringer's Solution: INTRAVENOUS | Qty: 500 | Status: AC

## 2012-02-08 MED FILL — Heparin Sodium (Porcine) Inj 1000 Unit/ML: INTRAMUSCULAR | Qty: 10 | Status: AC

## 2012-02-08 NOTE — Progress Notes (Signed)
[facility] BRIEF SICU PROGRESS NOTE  1 Day Post-Op  S/P Procedure(s) (LRB): AORTIC VALVE REPLACEMENT (AVR) (N/A) CORONARY ARTERY BYPASS GRAFTING (CABG) (N/A)   Stable day NSR with stable BP Breathing comfortably with O2 sats 98-100% Diuresing adequately Hgb stable  Plan: Continue current plan  Haden Cavenaugh H 02/08/2012 5:21 PM

## 2012-02-08 NOTE — Progress Notes (Signed)
1 Day Post-Op Procedure(s) (LRB): AORTIC VALVE REPLACEMENT (AVR) (N/A) CORONARY ARTERY BYPASS GRAFTING (CABG) (N/A) Subjective: Mild nausea, no pain  Objective: Vital signs in last 24 hours: Temp:  [95.5 F (35.3 C)-101.1 F (38.4 C)] 98.6 F (37 C) (02/28 0722) Pulse Rate:  [65-90] 66  (02/28 0700) Cardiac Rhythm:  [-] Normal sinus rhythm (02/28 0700) Resp:  [8-28] 13  (02/28 0700) BP: (101-161)/(44-75) 144/63 mmHg (02/28 0700) SpO2:  [97 %-100 %] 100 % (02/28 0700) Arterial Line BP: (109-179)/(43-75) 175/56 mmHg (02/28 0700) FiO2 (%):  [40 %-50 %] 40 % (02/27 1932) Weight:  [66 kg (145 lb 8.1 oz)-68.6 kg (151 lb 3.8 oz)] 68.6 kg (151 lb 3.8 oz) (02/28 0458)  Hemodynamic parameters for last 24 hours: PAP: (21-43)/(9-24) 28/10 mmHg CO:  [3.2 L/min-4 L/min] 3.8 L/min CI:  [1.8 L/min/m2-2.5 L/min/m2] 2.1 L/min/m2  Intake/Output from previous day: 02/27 0701 - 02/28 0700 In: 6810.8 [I.V.:4007.8; UJWJX:9147; NG/GT:60; IV Piggyback:900] Out: 4790 [Urine:2390; Emesis/NG output:300; Blood:1400; Chest Tube:700] Intake/Output this shift:    General appearance: alert and cooperative Neurologic: intact Heart: regular rate and rhythm Lungs: clear to auscultation bilaterally Extremities: edema moderate Wound: dressing dry  Lab Results:  Basename 02/08/12 0400 02/07/12 2016  WBC 5.6 5.3  HGB 6.8* 8.8*  HCT 20.3* 25.6*  PLT 66* 103*   BMET:  Basename 02/08/12 0400 02/07/12 2016 02/07/12 2012 02/06/12 1443  NA 141 -- 139 --  K 3.6 -- 4.6 --  CL 111 -- 104 --  CO2 20 -- -- 25  GLUCOSE 117* -- 150* --  BUN 24* -- 27* --  CREATININE 1.51* 1.63* -- --  CALCIUM 7.2* -- -- 9.7    PT/INR:  Basename 02/07/12 1437  LABPROT 21.0*  INR 1.78*   ABG    Component Value Date/Time   PHART 7.360 02/07/2012 2227   HCO3 24.7* 02/07/2012 2227   TCO2 26 02/07/2012 2227   ACIDBASEDEF 1.0 02/07/2012 2227   O2SAT 99.0 02/07/2012 2227   CBG (last 3)   Basename 02/08/12 0741 02/08/12 0400  02/08/12 0015  GLUCAP 157* 86 102*    Assessment/Plan: S/P Procedure(s) (LRB): AORTIC VALVE REPLACEMENT (AVR) (N/A) CORONARY ARTERY BYPASS GRAFTING (CABG) (N/A) Mobilize Diuresis:  Watch creatinine.  Baseline CKD with creat 2-2.4.  d/c tubes/lines reglan for nausea Acute blood loss anemia:  Repeat hgb this am before considering transfusion, since there was a 2 point drop since last pm. He was anemic preop. See progression orders   LOS: 1 day    Berley Gambrell K 02/08/2012

## 2012-02-08 NOTE — Op Note (Signed)
NAMEHAIDER, [name] [name]             ACCOUNT NO.:  [id number]  MEDICAL RECORD NO.:  [id number]  LOCATION:  2315                         FACILITY:  [facility]  PHYSICIAN:  Evelene Croon, M.D.     DATE OF BIRTH:  Jun 23, 1926  DATE OF PROCEDURE:  02/07/2012 DATE OF DISCHARGE:                              OPERATIVE REPORT   PREOPERATIVE DIAGNOSES: 1. Severe multivessel coronary artery disease. 2. Severe aortic stenosis.  POSTOPERATIVE DIAGNOSES: 1. Severe multivessel coronary artery disease. 2. Severe aortic stenosis.  OPERATIVE PROCEDURE:  Median sternotomy, extracorporeal circulation, coronary artery bypass graft surgery x3 using a left internal mammary artery graft to the left anterior descending coronary artery, with a saphenous vein graft to the second diagonal branch of the LAD, and a saphenous vein graft to the posterior descending branch of the right coronary artery.  Aortic valve replacement using a 23-mm Edwards pericardial valve.  Endoscopic vein harvesting from the right leg.  ATTENDING SURGEON:  Evelene Croon, MD  ASSISTANT:  Coral Ceo, PA-C  ANESTHESIA:  General endotracheal.  CLINICAL HISTORY:  This patient is an 78 year old gentleman with a known history of mild-to-moderate aortic stenosis with left ventricular hypertrophy, as well as hypertension, diabetes, and hyperlipidemia who was admitted last month with substernal chest pressure, which he had never had before.  Electrocardiogram showed ST-segment depression in lead V4 through V6 and his initial troponin was 0.31.  He had an echocardiogram done which showed moderate to severe aortic stenosis with ejection fraction of 25-30%.  Cardiac catheterization showed the culprit to be occluded left circumflex at its origin.  He also had significant ostial left main stenosis that I estimated to be at least 60%.  The LAD had a 50% ostial stenosis and in the midportion was diffusely diseased up to about 70%.  The right  coronary artery was a dominant vessel with 80-90% ostial stenosis.  Post catheterization, the patient went into pulmonary edema and required intubation.  He improved with diuresis and going off the ventilator.  Given his acute MI and baseline chronic kidney disease with creatinine of 2.2-2.4, it was felt that it would be best to let him recover from his myocardial infarction and plan to do aortic valve replacement and coronary bypass surgery in about 4 weeks. I saw him back in the office and he was doing quite well and said that he felt better than he has in a while.  He is anxious to proceed ahead with surgery while he was still doing well.  I discussed the operative procedure of aortic valve replacement and coronary bypass surgery with the patient and his wife including alternatives, benefits, and risks including, but not limited to, bleeding, blood transfusion, infection, stroke, myocardial infarction, graft failure, heart block requiring permanent pacemaker, organ dysfunction, and death.  He understood and agreed to proceed.  We also discussed the use of a tissue valve given his age and he was in agreement with that.  PROCEDURE:  The patient was taken to the operating room and placed on the table in supine position.  After induction of general endotracheal anesthesia, a Foley catheter was placed in the bladder using sterile technique.  Then, the chest, abdomen, and both lower  extremities were prepped and draped in usual sterile manner.  A TEE was performed by Dr. Sampson Goon and showed severe calcific aortic stenosis.  There was mild mitral regurgitation that was central.  Left ventricular function was moderately depressed with an ejection fraction about 25-30%.  Then, the chest was opened through a median sternotomy incision, and the pericardium opened in the midline.  Examination of the heart showed good ventricular contractility.  The ascending aorta had no palpable plaques in it,  it was relatively long.  Then, the left internal mammary artery was harvested from the chest wall as a pedicle graft.  This was a medium caliber vessel with excellent blood flow through it.  At the same time, we harvested a segment of greater saphenous vein from the right leg using endoscopic vein harvest technique.  This vein was of medium size and good quality.  Then, the patient was heparinized and when an adequate ACT was obtained, the distal ascending aorta was cannulated using a 20-French aortic cannula for arterial inflow.  Venous outflow was achieved using a two- stage venous cannula for the right atrial appendage.  An antegrade cardioplegia and vent cannula was inserted in the aortic root.  A left ventricular vent was placed through the right superior pulmonary vein, and a retrograde cardioplegia cannula placed through a pursestring suture in the right atrium and advanced in the coronary sinus.  The patient was placed on cardiopulmonary bypass and distal coronaries identified.  The LAD was a moderate-sized graftable vessel.  It was heavily diseased throughout its proximal and midportion but further distally was suitable for grafting.  The first diagonal branch was small and diffusely diseased and was not felt to be graftable.  The second diagonal was slightly larger and heavily diseased proximally but in the midportion was suitable for grafting.  The left circumflex gave off 2 small diffusely diseased marginal branches neither of which were graftable.  This area had some collateral flow seen on catheterization and still appeared to have some viable muscle in this territory.  The right coronary artery was diffusely calcified and tight and this extended out into the mid portion of the posterior descending branch. The distal posterior descending was small but suitable for grafting.  Then, the aorta was crossclamped and 500 mL of cold blood antegrade cardioplegia was administered  in the aortic root with quick arrest of the heart.  Systemic hypothermia to 20 degrees centigrade and topical hypothermic iced saline were used.  A temperature probe was placed in the septum and an insulating pad in the pericardium.  Then, the first distal anastomosis was performed to the posterior descending coronary artery.  The internal diameter distally was about 1.5-1.6 mm.  Conduit used was a segment of greater saphenous vein and the anastomosis performed in an end-to-side manner using continuous 7-0 Prolene suture.  Flow was noted through the graft was excellent.  Second distal anastomosis was performed at the second diagonal branch. The internal diameter distally was about 1.5 mm.  The conduit used was a second segment of greater saphenous vein and the anastomosis performed in an end-to-side manner using continuous 7-0 Prolene suture.  Flow was noted through the graft and was good.  A third distal anastomosis was performed to the distal LAD.  The internal diameter was about 1.6 mm.  The conduit used was a left internal mammary graft, this was brought through an opening in the left pericardium anterior to the phrenic nerve.  This anastomosed to the LAD in an  end-to-side manner using continuous 8-0 Prolene suture.  The pedicle was sutured to the epicardium with 6-0 Prolene sutures.  Then, another dose of antegrade cardioplegia was given followed by 300 mL of retrograde cold blood cardioplegia.  Additional doses of retrograde cardioplegia were given throughout the remainder of the operation about 20 minutes intervals to maintain myocardial temperature around 10 degrees centigrade.  The aorta was then opened transversely about 1 cm above the sino-tubular junction.  Examination of the native valve showed that there were 3 leaflets that were heavily calcified and poorly mobile.  The left and right coronary ostia were identified and appeared narrowed.  The native valve was excised.   The anulus was decalcified with rongeurs.  Care was taken to remove all particulate debris.  The left ventricle and the aortic root were irrigated with iced saline solution.  Then, the anulus was sized.  A 23-mm Edwards pericardial Magna-Ease valve was chosen. This had model #3300TFX, serial [id number]. Then, a series of pledgeted 2-0 Ethibond horizontal mattress sutures were placed around the aortic anulus with the pledgets in a subannular position.  The sutures were then placed through the sewing ring and the valve lowered into place.  The sutures were tied sequentially.  The valve seated nicely.  The coronary ostia were not obstructed.  The patient was then rewarmed to 37 degrees centigrade.  The aortotomy was closed in 2 layers using continuous 4-0 Prolene suture with felt strips to reinforce the closure.  Then, the 2 proximal vein graft anastomoses were performed in the mid ascending aorta in an end-to-side manner using continuous 6-0 Prolene suture.  Then, the left side of the heart was de- aired.  The head was placed in Trendelenburg position, and the cross- clamp removed with a time of 118 minutes.  There was spontaneous return of marked sinus bradycardia rhythm.  The proximal and distal anastomoses appeared hemostatic and allowed the grafts satisfactory.  Graft markers were placed around the proximal anastomoses.  Two temporary right ventricular and right atrial pacing wires were placed and brought out through the skin.  When the patient was warmed to 37 degrees centigrade, he was paced in an AV sequential mode and weaned from cardiopulmonary bypass on dopamine at 2 mcg/kg/minute.  Cardiac function appeared good with cardiac output of 4.5 L/minute.  TEE showed normal functioning of aortic valve prosthesis without evidence of perivalvular leak or regurgitation through the valve.  There was still mild mitral regurgitation.  Left ventricular function appeared improved.  Pulmonary  artery pressures were normal. Then, protamine was given, and the venous and aortic cannulae were removed without difficulty.  Hemostasis was achieved.  The patient was given 2 units of platelets due to platelet count of 70,000.  Then, 3 chest tubes were placed with a tube in the posterior pericardium, one in the left pleural space, and one in the anterior mediastinum.  The sternum was closed with #6 stainless steel wires.  The fascia was closed with continuous #1 Vicryl suture.  Subcutaneous tissues were closed with continuous 2-0 Vicryl and the skin with a 3-0 Vicryl subcuticular closure.  The lower extremity vein harvest site was closed in layers in a similar manner.  The sponge, needle, and instrument counts were correct according to the scrub nurse.  Dry sterile dressings were applied over the incisions around chest tubes, which were hooked to Pleur-Evac suction.  The patient remained hemodynamically stable and was transported to the SICU in guarded  but stable condition.  Evelene Croon, M.D.     BB/MEDQ  D:  02/07/2012  T:  02/08/2012  Job:  [id number]

## 2012-02-08 NOTE — Progress Notes (Signed)
CRITICAL VALUE ALERT  Critical value received:  Hg 6.8  Date of notification:  02/08/12  Time of notification:  1623  Critical value read back:yes  Nurse who received alert:  S. Meredith Mody, RN  MD notified (1st page): N/A  MD not called per order to not call for Hg unless lower the 6.  Will continue to closely monitor patient and call MD if patient shows any signs of bleeding.  Vitals WNL.

## 2012-02-08 NOTE — Plan of Care (Signed)
Problem: Phase I Progression Outcomes Goal: Verbalized level of anxiety/depression Outcome: Completed/Met Date Met:  02/08/12 denies

## 2012-02-08 NOTE — Progress Notes (Signed)
UR Completed.  [name] [name] 02/08/2012 [phone]

## 2012-02-08 NOTE — Progress Notes (Signed)
   CARE MANAGEMENT NOTE 02/08/2012  Patient:  [name] [name]   Account Number:  [id number]  Date Initiated:  02/08/2012  Documentation initiated by:  [facility]  Subjective/Objective Assessment:   Post op CABG x3.  has spouse.     Action/Plan:   PTA, PT INDEPENDENT, LIVES WITH SPOUSE.  PLANS TO DC HOME WITH SPOUSE AS CAREGIVER.   Anticipated DC Date:  02/13/2012   Anticipated DC Plan:  HOME W HOME HEALTH SERVICES      DC Planning Services  CM consult      Choice offered to / List presented to:             Status of service:  In process, will continue to follow Medicare Important Message given?   (If response is "NO", the following Medicare IM given date fields will be blank) Date Medicare IM given:   Date Additional Medicare IM given:    Discharge Disposition:    Per UR Regulation:  Reviewed for med. necessity/level of care/duration of stay  Comments:  02/08/12 [name] Howell,RN,BSN 1515 MET WITH PT TO DISCUSS HOME NEEDS.  PT STATES WIFE WILL PROVIDE 24HR CARE AT DISCHARGE.  HE HAS A WALKER AT HOME IF NEEDED.  WILL FOLLOW FOR HOME NEEDS AS PT PROGRESSES. Phone #[phone]

## 2012-02-09 ENCOUNTER — Encounter: Payer: Self-pay | Admitting: Cardiology

## 2012-02-09 ENCOUNTER — Inpatient Hospital Stay (HOSPITAL_COMMUNITY): Payer: Medicare Other

## 2012-02-09 DIAGNOSIS — J90 Pleural effusion, not elsewhere classified: Secondary | ICD-10-CM | POA: Diagnosis not present

## 2012-02-09 DIAGNOSIS — J9819 Other pulmonary collapse: Secondary | ICD-10-CM | POA: Diagnosis not present

## 2012-02-09 DIAGNOSIS — J984 Other disorders of lung: Secondary | ICD-10-CM | POA: Diagnosis not present

## 2012-02-09 LAB — BASIC METABOLIC PANEL
BUN: 30 mg/dL — ABNORMAL HIGH (ref 6–23)
CO2: 25 mEq/L (ref 19–32)
Calcium: 8.6 mg/dL (ref 8.4–10.5)
Creatinine, Ser: 1.95 mg/dL — ABNORMAL HIGH (ref 0.50–1.35)

## 2012-02-09 LAB — GLUCOSE, CAPILLARY
Glucose-Capillary: 60 mg/dL — ABNORMAL LOW (ref 70–99)
Glucose-Capillary: 84 mg/dL (ref 70–99)
Glucose-Capillary: 138 mg/dL — ABNORMAL HIGH (ref 70–99)
Glucose-Capillary: 93 mg/dL (ref 70–99)

## 2012-02-09 LAB — CBC
HCT: 30.5 % — ABNORMAL LOW (ref 39.0–52.0)
MCHC: 33.4 g/dL (ref 30.0–36.0)
MCV: 95.9 fL (ref 78.0–100.0)
Platelets: 96 10*3/uL — ABNORMAL LOW (ref 150–400)
RDW: 15.6 % — ABNORMAL HIGH (ref 11.5–15.5)
WBC: 9.1 10*3/uL (ref 4.0–10.5)

## 2012-02-09 MED ORDER — ASPIRIN EC 325 MG PO TBEC
325.0000 mg | DELAYED_RELEASE_TABLET | Freq: Every day | ORAL | Status: DC
  Administered 2012-02-09 – 2012-02-15 (×7): 325 mg via ORAL
  Filled 2012-02-09 (×7): qty 1

## 2012-02-09 MED ORDER — BISACODYL 5 MG PO TBEC
10.0000 mg | DELAYED_RELEASE_TABLET | Freq: Every day | ORAL | Status: DC | PRN
  Administered 2012-02-09: 10 mg via ORAL
  Filled 2012-02-09: qty 2

## 2012-02-09 MED ORDER — ONDANSETRON HCL 4 MG PO TABS: 4.0000 mg | ORAL_TABLET | Freq: Four times a day (QID) | ORAL | Status: DC | PRN

## 2012-02-09 MED ORDER — ACETAMINOPHEN 325 MG PO TABS: 650.0000 mg | ORAL_TABLET | Freq: Four times a day (QID) | ORAL | Status: DC | PRN

## 2012-02-09 MED ORDER — INSULIN GLARGINE 100 UNIT/ML ~~LOC~~ SOLN
15.0000 [IU] | Freq: Every day | SUBCUTANEOUS | Status: DC
  Administered 2012-02-09 – 2012-02-11 (×3): 15 [IU] via SUBCUTANEOUS
  Filled 2012-02-09: qty 3

## 2012-02-09 MED ORDER — INSULIN ASPART 100 UNIT/ML ~~LOC~~ SOLN
0.0000 [IU] | Freq: Three times a day (TID) | SUBCUTANEOUS | Status: DC
  Administered 2012-02-09 – 2012-02-12 (×7): 2 [IU] via SUBCUTANEOUS
  Administered 2012-02-12: 4 [IU] via SUBCUTANEOUS
  Administered 2012-02-12: 2 [IU] via SUBCUTANEOUS
  Administered 2012-02-13: 4 [IU] via SUBCUTANEOUS
  Administered 2012-02-13: 2 [IU] via SUBCUTANEOUS
  Administered 2012-02-13 (×2): 4 [IU] via SUBCUTANEOUS
  Administered 2012-02-14 (×4): 2 [IU] via SUBCUTANEOUS
  Filled 2012-02-09: qty 3

## 2012-02-09 MED ORDER — SODIUM CHLORIDE 0.9 % IJ SOLN: 3.0000 mL | INTRAMUSCULAR | Status: DC | PRN

## 2012-02-09 MED ORDER — TRAMADOL HCL 50 MG PO TABS: 50.0000 mg | ORAL_TABLET | Freq: Two times a day (BID) | ORAL | Status: DC | PRN

## 2012-02-09 MED ORDER — DOCUSATE SODIUM 100 MG PO CAPS
200.0000 mg | ORAL_CAPSULE | Freq: Every day | ORAL | Status: DC
  Administered 2012-02-09: 100 mg via ORAL
  Administered 2012-02-12: 200 mg via ORAL
  Filled 2012-02-09 (×6): qty 2

## 2012-02-09 MED ORDER — SODIUM CHLORIDE 0.9 % IV SOLN: 250.0000 mL | INTRAVENOUS | Status: DC | PRN

## 2012-02-09 MED ORDER — MOVING RIGHT ALONG BOOK
Freq: Once | Status: AC
  Administered 2012-02-09: 08:00:00
  Filled 2012-02-09: qty 1

## 2012-02-09 MED ORDER — ONDANSETRON HCL 4 MG/2ML IJ SOLN: 4.0000 mg | Freq: Four times a day (QID) | INTRAMUSCULAR | Status: DC | PRN

## 2012-02-09 MED ORDER — OXYCODONE HCL 5 MG PO TABS: 5.0000 mg | ORAL_TABLET | ORAL | Status: DC | PRN

## 2012-02-09 MED ORDER — SODIUM CHLORIDE 0.9 % IJ SOLN
3.0000 mL | Freq: Two times a day (BID) | INTRAMUSCULAR | Status: DC
  Administered 2012-02-09 – 2012-02-15 (×9): 3 mL via INTRAVENOUS

## 2012-02-09 MED ORDER — BISACODYL 10 MG RE SUPP
10.0000 mg | Freq: Every day | RECTAL | Status: DC | PRN
  Administered 2012-02-10: 10 mg via RECTAL
  Filled 2012-02-09: qty 1

## 2012-02-09 MED ORDER — PANTOPRAZOLE SODIUM 40 MG PO TBEC
40.0000 mg | DELAYED_RELEASE_TABLET | Freq: Every day | ORAL | Status: DC
  Administered 2012-02-09 – 2012-02-15 (×7): 40 mg via ORAL
  Filled 2012-02-09 (×5): qty 1

## 2012-02-09 NOTE — Progress Notes (Signed)
Pt voided at 1759 after having foley removed at 0900 this am. Pt had not voided other than that. Bladder scanned after pt voided and 0ml were left. Will continue to encourage PO intake of water and monitor voiding.

## 2012-02-09 NOTE — Progress Notes (Signed)
CARDIAC REHAB PHASE I   PRE:  Rate/Rhythm: 67 SR    BP: sitting 160/68    SaO2: 95 RA  MODE:  Ambulation: 400 ft   POST:  Rate/Rhythm: 72    BP: sitting 160/70     SaO2: 97  RA  Assist x1 with RW for 3 rd walk today. Sts he feels good and is not tired. Had trouble staying close to RW and keeping feet inside legs of RW during turns. This was a problem last hospitalization as well. Return to recliner. [phone]  [name] CES, ACSM

## 2012-02-09 NOTE — Progress Notes (Signed)
CBG at 0200 60.  Pt asymptomatic.  Pt given 240oz orange juice.  CBG rechecked at 0250 = 84.  Will continue to monitor

## 2012-02-09 NOTE — Progress Notes (Signed)
2 Days Post-Op Procedure(s) (LRB): AORTIC VALVE REPLACEMENT (AVR) (N/A) CORONARY ARTERY BYPASS GRAFTING (CABG) (N/A) Subjective: No complaints   Objective: Vital signs in last 24 hours: Temp:  [98 F (36.7 C)-99 F (37.2 C)] 98 F (36.7 C) (03/01 0738) Pulse Rate:  [53-66] 65  (03/01 0700) Cardiac Rhythm:  [-] Sinus bradycardia (03/01 0000) Resp:  [15-22] 19  (03/01 0700) BP: (130-177)/(50-99) 151/64 mmHg (03/01 0700) SpO2:  [94 %-100 %] 97 % (03/01 0700) Arterial Line BP: (136-186)/(44-67) 150/49 mmHg (02/28 1315) Weight:  [69.1 kg (152 lb 5.4 oz)] 69.1 kg (152 lb 5.4 oz) (03/01 0600)  Hemodynamic parameters for last 24 hours: PAP: (29-39)/(12-20) 39/20 mmHg  Intake/Output from previous day: 02/28 0701 - 03/01 0700 In: 1923 [P.O.:1080; I.V.:532; IV Piggyback:311] Out: 1650 [Urine:1620; Chest Tube:30] Intake/Output this shift:    General appearance: alert and cooperative Neurologic: intact Heart: regular rate and rhythm and friction rub heard  Lungs: clear to auscultation bilaterally Extremities: edema mild Wound: dressing dry  Lab Results:  Basename 02/09/12 0400 02/08/12 1730 02/08/12 1700  WBC 9.1 -- 8.9  HGB 10.2* 10.2* --  HCT 30.5* 30.0* --  PLT 96* -- 113*   BMET:  Basename 02/09/12 0400 02/08/12 1730 02/08/12 0400  NA 137 139 --  K 4.6 4.9 --  CL 104 104 --  CO2 25 -- 20  GLUCOSE 89 205* --  BUN 30* 29* --  CREATININE 1.95* 1.80* --  CALCIUM 8.6 -- 7.2*    PT/INR:  Basename 02/07/12 1437  LABPROT 21.0*  INR 1.78*   ABG    Component Value Date/Time   PHART 7.360 02/07/2012 2227   HCO3 24.7* 02/07/2012 2227   TCO2 25 02/08/2012 1730   ACIDBASEDEF 1.0 02/07/2012 2227   O2SAT 99.0 02/07/2012 2227   CBG (last 3)   Basename 02/09/12 0403 02/09/12 0256 02/09/12 0211  GLUCAP 78 84 60*    Assessment/Plan: S/P Procedure(s) (LRB): AORTIC VALVE REPLACEMENT (AVR) (N/A) CORONARY ARTERY BYPASS GRAFTING (CABG) (N/A) Mobilize Diabetes control:   Continue lantus and SSI. Chronic Kidney disease: Creatinine baseline about 2-2.4 Plan for transfer to step-down: see transfer orders   LOS: 2 days    Maeola Mchaney K 02/09/2012

## 2012-02-10 LAB — URINALYSIS, ROUTINE W REFLEX MICROSCOPIC
Bilirubin Urine: NEGATIVE
Glucose, UA: NEGATIVE mg/dL
Ketones, ur: NEGATIVE mg/dL
Nitrite: NEGATIVE
Protein, ur: 100 mg/dL — AB
Specific Gravity, Urine: 1.019 (ref 1.005–1.030)
Urobilinogen, UA: 1 mg/dL (ref 0.0–1.0)
pH: 5.5 (ref 5.0–8.0)

## 2012-02-10 LAB — CBC
MCH: 31.6 pg (ref 26.0–34.0)
MCHC: 33.9 g/dL (ref 30.0–36.0)
Platelets: 121 10*3/uL — ABNORMAL LOW (ref 150–400)

## 2012-02-10 LAB — BASIC METABOLIC PANEL
BUN: 33 mg/dL — ABNORMAL HIGH (ref 6–23)
CO2: 24 mEq/L (ref 19–32)
Calcium: 9.2 mg/dL (ref 8.4–10.5)
Chloride: 99 mEq/L (ref 96–112)
Creatinine, Ser: 2.09 mg/dL — ABNORMAL HIGH (ref 0.50–1.35)
Glucose, Bld: 100 mg/dL — ABNORMAL HIGH (ref 70–99)

## 2012-02-10 LAB — GLUCOSE, CAPILLARY: Glucose-Capillary: 108 mg/dL — ABNORMAL HIGH (ref 70–99)

## 2012-02-10 LAB — URINE MICROSCOPIC-ADD ON

## 2012-02-10 MED ORDER — SODIUM CHLORIDE 0.9 % IV SOLN
INTRAVENOUS | Status: DC
  Administered 2012-02-10 – 2012-02-11 (×3): via INTRAVENOUS

## 2012-02-10 MED ORDER — TAMSULOSIN HCL 0.4 MG PO CAPS
0.4000 mg | ORAL_CAPSULE | Freq: Every day | ORAL | Status: DC
  Administered 2012-02-10 – 2012-02-14 (×5): 0.4 mg via ORAL
  Filled 2012-02-10 (×7): qty 1

## 2012-02-10 NOTE — Progress Notes (Signed)
                    [address]            [place] [address]          [phone]     3 Days Post-Op Procedure(s) (LRB): AORTIC VALVE REPLACEMENT (AVR) (N/A) CORONARY ARTERY BYPASS GRAFTING (CABG) (N/A)  Subjective: Had urinary retention overnight requiring Foley to be replaced.  Reports no h/o GU problems in past.  Finally had a BM.  Objective: Vital signs in last 24 hours: Patient Vitals for the past 24 hrs:  BP Temp Temp src Pulse Resp SpO2 Weight  02/10/12 0436 129/62 mmHg 98.8 F (37.1 C) Oral 68  18  94 % 70.217 kg (154 lb 12.8 oz)  02/09/12 2155 114/50 mmHg 98.4 F (36.9 C) Oral 67  16  90 % -  02/09/12 1420 116/86 mmHg 97.8 F (36.6 C) Oral 66  16  93 % -   Current Weight  02/10/12 70.217 kg (154 lb 12.8 oz)     Intake/Output from previous day: 03/01 0701 - 03/02 0700 In: 1380 [P.O.:1380] Out: 200 [Urine:200]  CBGs 40-138-93-88-100  PHYSICAL EXAM:  Heart: RRR Lungs: decreased BS in bases Wound: clean and dry Extremities: mild RLE edema  Lab Results: CBC: Basename 02/10/12 0655 02/09/12 0400  WBC 11.9* 9.1  HGB 10.5* 10.2*  HCT 31.0* 30.5*  PLT 121* 96*   BMET:  Basename 02/10/12 0655 02/09/12 0400  NA 134* 137  K 4.1 4.6  CL 99 104  CO2 24 25  GLUCOSE 100* 89  BUN 33* 30*  CREATININE 2.09* 1.95*  CALCIUM 9.2 8.6    PT/INR:  Basename 02/07/12 1437  LABPROT 21.0*  INR 1.78*     Assessment/Plan: S/P Procedure(s) (LRB): AORTIC VALVE REPLACEMENT (AVR) (N/A) CORONARY ARTERY BYPASS GRAFTING (CABG) (N/A) CV- stable on current meds. CRI- Cr up some today- continue to monitor.  GU- urinary retention. Foley back in place, UOP marginal.  Continue IVF for now. ?start Flomax. DM- Metformin held secondary to increased Cr.  Continue Lantus, SSI for now. CRPI/PT   LOS: 3 days    Ludia Gartland H 02/10/2012

## 2012-02-10 NOTE — Progress Notes (Signed)
Patient in bathroom and trying to have a B.M. And cannot and voided a very small amt in commode. Abdo. Sl. Distended and patient feels like he has to void and have a  B.M. Bladder scan patient 266 ml in bladder. R.N. Aware and Dr. Maren Beach aware of output last 24 hrs. See orders to start 0.9 % N.S. At 50 ml/hr and insert foley. Gave patient a ducolax supp. Patient is impacted felt hard stool down low. Foley 75F inserted without diff. Pt. tol. well. Clr. Yellow urine obtain.approx 150 m; obtained.  :Patient had a large B.M.

## 2012-02-10 NOTE — Progress Notes (Signed)
CARDIAC REHAB PHASE I   PRE:  Rate/Rhythm: 65 SR    BP: sitting 140/60    SaO2: 98  RA  MODE:  Ambulation: 500 ft   POST:  Rate/Rhythm: 74    BP: sitting 160/70     SaO2: 97 RA  Tolerated well. First walk today. Sts walking to BR many times. VCs to keep feet inside RW. Doing well otherwise. To recliner.  [id number]  [name] CES, ACSM

## 2012-02-10 NOTE — Progress Notes (Signed)
Postop day 5 aVR-CABG x3  Patient is progressing maintaining sinus rhythm ambulating on room air. Last p.m. a Foley catheter was inserted do to retention and elevated creatinine 2.4. He is been placed on IV fluids 50 cc per hour and the urine output from his indwelling catheter is now adequate. We will initiate Flomax and leave the catheter in for 48 hours. UA will be checked.  The patient and wife expressed desire for him to go home with home health support and we'll care management for assistance with arrangements.

## 2012-02-10 NOTE — Progress Notes (Signed)
Patient ambulated 500 ft using rolling walker with RN on room air. Patient has a slow, steady gait. Patient tolerated ambulation well. Patient returned to bed and resting. Will continue to monitor.

## 2012-02-11 LAB — GLUCOSE, CAPILLARY
Glucose-Capillary: 120 mg/dL — ABNORMAL HIGH (ref 70–99)
Glucose-Capillary: 126 mg/dL — ABNORMAL HIGH (ref 70–99)

## 2012-02-11 LAB — BASIC METABOLIC PANEL
CO2: 26 mEq/L (ref 19–32)
Calcium: 8.7 mg/dL (ref 8.4–10.5)
GFR calc non Af Amer: 29 mL/min — ABNORMAL LOW (ref >90)
Potassium: 4.1 mEq/L (ref 3.5–5.1)
Sodium: 139 mEq/L (ref 135–145)

## 2012-02-11 MED ORDER — GLUCOSE 40 % PO GEL
ORAL | Status: AC
  Administered 2012-02-11: 06:00:00
  Filled 2012-02-11: qty 1

## 2012-02-11 NOTE — Progress Notes (Signed)
Pt. Ambulated 500 ft using RW.  Tolerated well. Encouraged one more walk today. [name] [name], Chrystine Oiler

## 2012-02-11 NOTE — Progress Notes (Addendum)
                    [address]            [place] [address]          [phone]     4 Days Post-Op Procedure(s) (LRB): AORTIC VALVE REPLACEMENT (AVR) (N/A) CORONARY ARTERY BYPASS GRAFTING (CABG) (N/A)  Subjective: Feels well, no complaints.  Objective: Vital signs in last 24 hours: Patient Vitals for the past 24 hrs:  BP Temp Temp src Pulse Resp SpO2 Weight  02/11/12 0508 146/78 mmHg 99.7 F (37.6 C) Oral 74  19  91 % 70.761 kg (156 lb)  02/10/12 1959 149/76 mmHg 99.1 F (37.3 C) Oral 73  19  94 % -  02/10/12 1516 151/75 mmHg 98.1 F (36.7 C) Oral 66  18  95 % -   Current Weight  02/11/12 70.761 kg (156 lb)     Intake/Output from previous day: 03/02 0701 - 03/03 0700 In: 1690.8 [P.O.:480; I.V.:1210.8] Out: 1660 [Urine:1660]  CBGs 116-57-60-120  PHYSICAL EXAM:  Heart: RRR Lungs: clear Wound: clean and dry Extremities: mild LE edema  Lab Results: CBC: Basename 02/10/12 0655 02/09/12 0400  WBC 11.9* 9.1  HGB 10.5* 10.2*  HCT 31.0* 30.5*  PLT 121* 96*   BMET:  Basename 02/11/12 0505 02/10/12 0655  NA 139 134*  K 4.1 4.1  CL 106 99  CO2 26 24  GLUCOSE 57* 100*  BUN 35* 33*  CREATININE 1.99* 2.09*  CALCIUM 8.7 9.2    PT/INR: No results found for this basename: LABPROT,INR in the last 72 hours U/A  Small leukocytes, cx pending  Assessment/Plan: S/P Procedure(s) (LRB): AORTIC VALVE REPLACEMENT (AVR) (N/A) CORONARY ARTERY BYPASS GRAFTING (CABG) (N/A) CV- stable GU- Leave Foley today and consider voiding trial in am.  On Flomax. CRI- Cr trending back down.  Watch.  Will saline lock IVF as UOP excellent and eating better. DM- po meds held due to increased Cr. Having hypoglycemia - will d/c Lantus and monitor. Continue SSI.   LOS: 4 days    COLLINS,GINA H 02/11/2012   patient examined and medical record reviewed,agree with above note. VAN TRIGT III,Estelita Iten 02/11/2012

## 2012-02-11 NOTE — Progress Notes (Signed)
CBG: 60  Treatment: Glucose Gel  Symptoms: None  Follow-up CBG: Time:0709 CBG Result:120  Possible Reasons for Event: Unknown  Comments/MD notified:Will continue to monitor    [name] [name], Wayne Both

## 2012-02-12 DIAGNOSIS — I359 Nonrheumatic aortic valve disorder, unspecified: Secondary | ICD-10-CM

## 2012-02-12 DIAGNOSIS — I2510 Atherosclerotic heart disease of native coronary artery without angina pectoris: Secondary | ICD-10-CM | POA: Diagnosis not present

## 2012-02-12 LAB — BASIC METABOLIC PANEL
BUN: 26 mg/dL — ABNORMAL HIGH (ref 6–23)
CO2: 24 mEq/L (ref 19–32)
Chloride: 106 mEq/L (ref 96–112)
Creatinine, Ser: 1.68 mg/dL — ABNORMAL HIGH (ref 0.50–1.35)
Glucose, Bld: 72 mg/dL (ref 70–99)

## 2012-02-12 LAB — URINE CULTURE
Colony Count: NO GROWTH
Culture  Setup Time: 201303030440
Culture: NO GROWTH

## 2012-02-12 LAB — GLUCOSE, CAPILLARY
Glucose-Capillary: 78 mg/dL (ref 70–99)
Glucose-Capillary: 151 mg/dL — ABNORMAL HIGH (ref 70–99)
Glucose-Capillary: 146 mg/dL — ABNORMAL HIGH (ref 70–99)
Glucose-Capillary: 183 mg/dL — ABNORMAL HIGH (ref 70–99)

## 2012-02-12 MED ORDER — TRAMADOL HCL 50 MG PO TABS: 50.0000 mg | ORAL_TABLET | Freq: Two times a day (BID) | ORAL | Status: AC | PRN

## 2012-02-12 MED ORDER — POTASSIUM CHLORIDE CRYS ER 20 MEQ PO TBCR: 20.0000 meq | EXTENDED_RELEASE_TABLET | Freq: Every day | ORAL | Status: DC

## 2012-02-12 MED ORDER — FUROSEMIDE 40 MG PO TABS: 40.0000 mg | ORAL_TABLET | Freq: Every day | ORAL | Status: DC

## 2012-02-12 MED ORDER — POTASSIUM CHLORIDE CRYS ER 20 MEQ PO TBCR
30.0000 meq | EXTENDED_RELEASE_TABLET | Freq: Once | ORAL | Status: AC
  Administered 2012-02-12: 30 meq via ORAL
  Filled 2012-02-12: qty 1

## 2012-02-12 MED ORDER — POTASSIUM CHLORIDE CRYS ER 20 MEQ PO TBCR
40.0000 meq | EXTENDED_RELEASE_TABLET | Freq: Two times a day (BID) | ORAL | Status: DC
  Administered 2012-02-12 – 2012-02-13 (×2): 40 meq via ORAL
  Filled 2012-02-12 (×3): qty 2

## 2012-02-12 MED ORDER — TRAMADOL HCL 50 MG PO TABS: 50.0000 mg | ORAL_TABLET | Freq: Two times a day (BID) | ORAL | Status: DC | PRN

## 2012-02-12 MED ORDER — ASPIRIN 325 MG PO TBEC: 325.0000 mg | DELAYED_RELEASE_TABLET | Freq: Every day | ORAL | Status: DC

## 2012-02-12 MED ORDER — CARVEDILOL 6.25 MG PO TABS: 6.2500 mg | ORAL_TABLET | Freq: Two times a day (BID) | ORAL | Status: DC

## 2012-02-12 MED ORDER — FUROSEMIDE 80 MG PO TABS
80.0000 mg | ORAL_TABLET | Freq: Two times a day (BID) | ORAL | Status: DC
  Administered 2012-02-12 – 2012-02-14 (×4): 80 mg via ORAL
  Filled 2012-02-12 (×6): qty 1

## 2012-02-12 MED ORDER — TAMSULOSIN HCL 0.4 MG PO CAPS: 0.4000 mg | ORAL_CAPSULE | Freq: Every day | ORAL | Status: DC

## 2012-02-12 NOTE — Discharge Summary (Signed)
Physician Discharge Summary  Patient ID: Cori Henningsen MRN: [id number] DOB/AGE: [birth year] 78 y.o.  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   Procedure (s):  Median sternotomy, extracorporeal circulation,  coronary artery bypass graft surgery x3 using a left internal mammary artery graft to the left anterior descending coronary artery, with a saphenous vein graft to the second diagonal branch of the LAD, and a saphenous vein graft to the posterior descending branch of the right coronary artery. Aortic valve replacement using a 23-mm Edwards  pericardial valve. Endoscopic vein harvesting from the right leg by Dr. Laneta Simmers on 02/08/2012.  History of Presenting Illness: This is an 78 year old Caucasian male with  a known history of mild to moderate aortic stenosis with left ventricular hypertrophy as well as hypertension, diabetes, and hyperlipidemia. He was in his usual state of health until around midnight on 12/12/2010. While trying to get sleep he developed sudden substernal chest pressure which he never had before. After about 3 hours, it was getting worse and he woke his wife up to call [phone] and he was brought to [facility] by EMS. He was given an aspirin as well as nitroglycerin and his chest pain resolved completely. It recurred in the emergency room. Echocardiogram showed ST segment depression in leads V4 through V6 and his initial troponin was 0.31. An echocardiogram showed moderate to severe aortic  stenosis with an ejection fraction of 25-30%. He subsequently underwent cardiac catheterization which showed the culprit to be a left circumflex occluded at the origin. The patient also has a significant ostial left main stenosis and I would estimate to be in the range of 60%. The LAD has a 50% ostial stenosis in the mid LAD and is diffusely diseased up to about 70%. The right coronary artery is a dominant vessel with 80 and 90% ostial stenosis. The patient went into pulmonary edema post catheterization and required intubation. I was consulted to see him during this hospitalization.A long discussion was had regarding the necessitation of an AVR and CABG. He agreed that aortic valve replacement and coronary bypass graft surgery was probably the best treatment for preventing further myocardial ischemia and infarction and progressive congestive heart failure symptoms. Potential risks, complications, and benefits were discussed with him and he agreed to proceed. Dr. [place] and I felt that it was best to allow him to recover for a month or so after his myocardial infarction before proceeding with surgery. It should also be noted that he has some chronic kidney disease with a creatinine of around 2.2-2.4. Since discharge the patient said that he has continued to feel better. He is now walking and exercising daily without chest pain or shortness of breath. He denies any dyspnea or fatigue and his appetite has been good. He was admitted to [facility] in order to undergo an AVR/CABGx3 on 02/08/2012.   Brief Hospital Course:  He was extubated successfully without difficulty the evening of surgery. Ganz, A-line, and chest tubes were all removed early in his postoperative course. His Foley did remained for a couple days postoperatively as he had elevated renal insufficiency. He was also  found have acute blood loss anemia. His H&H was low as 6 and 20.3. This was repeated because there was almost a 2 point drop from  earlier. The repeat H and H was 10.2 and 30.5.He was also found to have thrombocytopenia postoperatively. His platelet count was low as 66,000. His last platelet count was up to 121,000. She stated, the patient history of diabetes mellitus. His glucose was well controlled on insulin. He was not able to be restarted on his metformin as his creatinine remained elevated. His creatinine did go as high as 2.09; however, his last creatinine was down to 1.68. He was felt surgically stable for transfer from the intensive care unit to PCT U. for further convalescence on 02/09/2012. Unfortunately, he developed urinary retention and had to have the Foley be reinserted. He was also started on Flomax. His Foley was removed on 02/11/2012 and he was able to void on his own without difficulty. He has  already been tolerating a diet and has had a bowel movement. His continued to progress with cardiac rehabilitation. He then had hypoglycemia as a result all scheduled insulin was discontinued and he was only given insulin on a when necessary basis. Epicardial pacing wires and chest tube sutures will be removed in the a.m. Provided he remains afebrile, hemodynamically  stable, and pending morning round evaluation, he will be stable for discharge on 02/13/2012. Filed Vitals:   02/12/12 0455  BP: 153/75  Pulse: 67  Temp: 97.7 F (36.5 C)  Resp: 16     Latest Vital Signs: Blood pressure 153/75, pulse 67, temperature 97.7 F (36.5 C), temperature source Oral, resp. rate 16, height 5\' 9"  (1.753 m), weight 158 lb 1.6 oz (71.714 kg), SpO2 94.00%.  Physical Exam: Cardiovascular: RRR, no murmurs, gallops, or rubs.  Pulmonary: Slightly decreased at bases; no rales, wheezes, or rhonchi.  Abdomen: Soft, non tender, bowel sounds present.  Extremities: Trace lower extremity edema.  Wounds: Clean and dry. No erythema or signs of infection.   Discharge Condition:Stable  Recent laboratory studies:  Lab Results  Component Value  Date   WBC 11.9* 02/10/2012   HGB 10.5* 02/10/2012   HCT 31.0* 02/10/2012   MCV 93.4 02/10/2012   PLT 121* 02/10/2012   Lab Results  Component Value Date   NA 137 02/12/2012   K 3.5 02/12/2012   CL 106 02/12/2012   CO2 24 02/12/2012   CREATININE 1.68* 02/12/2012   GLUCOSE 72 02/12/2012      Diagnostic Studies:   Dg Chest Portable 1 View In Am  02/09/2012  *RADIOLOGY REPORT*  Clinical Data: Postop for cardiac surgery.  Hypertension. Diabetes.  Hypokalemia.  PORTABLE CHEST - 1 VIEW  Comparison: 1 day prior  Findings: Removal of Swan-Ganz catheter with a right IJ Cordis sheath remaining in place. Prior median sternotomy.  Aortic valve repair.  Mediastinal drain and left-sided chest tubes have been removed. No pneumothorax.  Small left pleural effusion is not significantly changed.  Improved interstitial edema/mild venous congestion.  Patchy bibasilar airspace disease persists.  IMPRESSION:  1.  Improved interstitial edema/venous congestion. 2.  Removal of support apparatus, including left-sided chest tubes without pneumothorax. 3.  Similar small left pleural effusion and patchy bibasilar atelectasis.  Original Report Authenticated By: Consuello Bossier, M.D.    Discharge Orders    Future Appointments: Provider: Department: Dept Phone: Center:   02/22/2012 12:00 PM Valera Castle, MD [facility] [phone] [facility]   03/05/2012 2:30 PM [name], MD [facility]  [phone] [id number]      Discharge Medications: Medication List  As of 02/12/2012  9:26 AM   STOP taking these medications         aspirin 81 MG chewable tablet      isosorbide mononitrate 30 MG 24 hr tablet      Ticagrelor 90 MG Tabs tablet         TAKE these medications         aspirin 325 MG EC tablet   Take 1 tablet (325 mg total) by mouth daily.      carvedilol 6.25 MG tablet   Commonly known as: COREG   Take 1 tablet (6.25 mg total) by mouth 2 (two) times daily with a meal.      cholecalciferol 1000 UNITS tablet    Commonly known as: VITAMIN D   Take 2,000 Units by mouth daily.      furosemide 40 MG tablet   Commonly known as: LASIX   Take 1 tablet (40 mg total) by mouth daily. For 5 days then stop.      metFORMIN 500 MG tablet   Commonly known as: GLUCOPHAGE   Take 500-1,000 mg by mouth. 2 tabs in the am, 1 tab in the pm      metipranolol 0.3 % ophthalmic solution   Commonly known as: OPTIPRANOLOL   Place 1 drop into both eyes 2 (two) times daily.      multivitamin capsule   Take 1 capsule by mouth daily.      omeprazole 20 MG capsule   Commonly known as: PRILOSEC   Take 20 mg by mouth daily.      potassium chloride SA 20 MEQ tablet   Commonly known as: K-DUR,KLOR-CON   Take 1 tablet (20 mEq total) by mouth daily. For 5 days then stop.      simvastatin 10 MG tablet   Commonly known as: ZOCOR   Take 10 mg by mouth at bedtime.      Tamsulosin HCl 0.4 MG Caps   Commonly known as: FLOMAX   Take 1 capsule (0.4 mg total) by mouth daily after supper.      traMADol 50 MG tablet   Commonly known as: ULTRAM   Take 1 tablet (50 mg total) by mouth every 12 (twelve) hours as needed for pain.            Follow Up Appointments: Follow-up Information    Follow up with Valera Castle, MD. (Call for a follow up for 2 weeks)    Contact information:   [address] [place] [address] [phone]       Follow up with [name], MD. (PA/LAT CXR to be taken on 03/05/2012  at 1:30 pm;Appointment with Dr. Laneta Simmers is on 03/05/2012 at 2:30 pm)    Contact information:   [address] [place] [address] [phone]          Signed: Doree Fudge MPA-C 02/12/2012, 9:26 AM

## 2012-02-12 NOTE — Progress Notes (Signed)
Foley removed at 0500 this AM. Pt voided x1 at 0830 AM. No s/s of distress. No discomfort. Will continue to monitor. Dion Saucier

## 2012-02-12 NOTE — Progress Notes (Signed)
    SUBJECTIVE: Only c/o mild SOB with activity. No chest pain. No events.   BP 153/75  Pulse 67  Temp(Src) 97.7 F (36.5 C) (Oral)  Resp 16  Ht 5\' 9"  (1.753 m)  Wt 158 lb 1.6 oz (71.714 kg)  BMI 23.35 kg/m2  SpO2 94%  Intake/Output Summary (Last 24 hours) at 02/12/12 0811 Last data filed at 02/12/12 0500  Gross per 24 hour  Intake 1684.17 ml  Output    600 ml  Net 1084.17 ml    PHYSICAL EXAM General: Well developed, well nourished, in no acute distress. Alert and oriented x 3.  Psych:  Good affect, responds appropriately Neck: No JVD. No masses noted.  Lungs: Clear bilaterally with no wheezes or rhonci noted.  Heart: RRR with no murmurs noted. Abdomen: Bowel sounds are present. Soft, non-tender.  Extremities: No lower extremity edema.   LABS: Basic Metabolic Panel:  Basename 02/12/12 0500 02/11/12 0505  NA 137 139  K 3.5 4.1  CL 106 106  CO2 24 26  GLUCOSE 72 57*  BUN 26* 35*  CREATININE 1.68* 1.99*  CALCIUM 8.2* 8.7  MG -- --  PHOS -- --   CBC:  Basename 02/10/12 0655  WBC 11.9*  NEUTROABS --  HGB 10.5*  HCT 31.0*  MCV 93.4  PLT 121*   Current Meds:    . aspirin EC  325 mg Oral Daily  . carvedilol  6.25 mg Oral BID WC  . cholecalciferol  2,000 Units Oral Daily  . docusate sodium  200 mg Oral Daily  . insulin aspart  0-24 Units Subcutaneous TID AC & HS  . mupirocin ointment   Nasal BID  . pantoprazole  40 mg Oral QAC breakfast  . simvastatin  10 mg Oral QHS  . sodium chloride  3 mL Intravenous Q12H  . Tamsulosin HCl  0.4 mg Oral QPC supper  . timolol  1 drop Both Eyes BID  . DISCONTD: insulin glargine  15 Units Subcutaneous Daily     ASSESSMENT AND PLAN:  Pt followed in our office by Dr. Daleen Squibb. Now s/p AVR with bioprosthetic AV and 3V CABG on 02/07/12. He is doing well overall.  Renal function is improving. He has some SOB. Lungs are clear overall. May need repeat CXR.  [name] [name]  3/4/20138:11 AM

## 2012-02-12 NOTE — Progress Notes (Addendum)
CARDIAC REHAB PHASE I   PRE:  Rate/Rhythm: 71 SR  BP:  Supine:   Sitting: 110/68  Standing:    SaO2: 97 RA  MODE:  Ambulation: 550 ft   POST:  Rate/Rhythem: 77  BP:  Supine:   Sitting: 122/70  Standing:    SaO2: 97 RA 0955-1015 Assisted X 1 and used hand assist to ambulate. Gait steady with walking. VS stable He has walker at home he can use. Back to recliner after walk with call light in reach and family present. Pt was using arms a lot with getting out of chair, discussed sternal precautions with him and family.  [name] [name]

## 2012-02-12 NOTE — Progress Notes (Signed)
Patient ID: [name] [name], male   DOB: 01-Mar-1926, 78 y.o.   MRN: [id number]   He is still SOB with lying down and walking.  Weight is 6 kg over preop and he has significant lower extremity edema.  I would diurese him further. He has bilateral effusions and atelectasis on exam.  CXR to be done in am.  I would not send him home until breathing is better or he will bounce right back.

## 2012-02-12 NOTE — Progress Notes (Signed)
UR Completed.  [name] [name] [phone] 02/12/2012  

## 2012-02-12 NOTE — Progress Notes (Signed)
Called to restart out-dated iv; site wnl; flushed easily with 10cc ns; pt requests "it not be changed; let's leave it;"  Pt for probable d/c home Tuesday; will re-assess and chg if needed;

## 2012-02-12 NOTE — Progress Notes (Addendum)
5 Days Post-Op Procedure(s) (LRB): AORTIC VALVE REPLACEMENT (AVR) (N/A) CORONARY ARTERY BYPASS GRAFTING (CABG) (N/A)  Subjective: Patient with some shortness of breath.Has already voided without difficulty.  Objective: Vital signs in last 24 hours: Patient Vitals for the past 24 hrs:  BP Temp Temp src Pulse Resp SpO2 Weight  02/12/12 0500 - - - - - - 158 lb 1.6 oz (71.714 kg)  02/12/12 0455 153/75 mmHg 97.7 F (36.5 C) Oral 67  16  94 % -  02/11/12 2013 148/72 mmHg 99.7 F (37.6 C) Oral 76  18  91 % -  02/11/12 1500 150/74 mmHg 97.5 F (36.4 C) Oral 72  18  95 % -   Pre op weight  66.4 kg Current Weight  02/12/12 158 lb 1.6 oz (71.714 kg)      Intake/Output from previous day: 03/03 0701 - 03/04 0700 In: 1924.2 [P.O.:480; I.V.:1144.2] Out: 600 [Urine:600]   Physical Exam:  Cardiovascular: RRR, no murmurs, gallops, or rubs. Pulmonary: Slightly decreased at bases; no rales, wheezes, or rhonchi. Abdomen: Soft, non tender, bowel sounds present. Extremities: Trace lower extremity edema. Wounds: Clean and dry.  No erythema or signs of infection.  Lab Results: CBC: Basename 02/10/12 0655  WBC 11.9*  HGB 10.5*  HCT 31.0*  PLT 121*   BMET:  Basename 02/12/12 0500 02/11/12 0505  NA 137 139  K 3.5 4.1  CL 106 106  CO2 24 26  GLUCOSE 72 57*  BUN 26* 35*  CREATININE 1.68* 1.99*  CALCIUM 8.2* 8.7    PT/INR: No results found for this basename: LABPROT,INR in the last 72 hours ABG:  INR: Will add last result for INR, ABG once components are confirmed Will add last 4 CBG results once components are confirmed  Assessment/Plan:  1. CV - Previous trigeminy.Maintaining SR. Continue Coreg 6.25 bid. 2.  Pulmonary - Encourage incentive spirometer.Check CXR am. 3. Renal insufficiency-Creatinine continues to decrease. Now 1.68. 4.  Acute blood loss anemia - Last H/H 10.5/31. 5.DM-Pre op HGA1C 6.CBGs 141/126/78.Oral medication being held secondary to elevated  creatinine. 6.Supplement potassium. 7.Probable home mid week.  ZIMMERMAN,DONIELLE MPA-C 02/12/2012   I have seen and examined Margaretmary Dys and agree with the above assessment  and plan.  Delight Ovens MD [facility] [phone] Office [phone] 02/12/2012 9:12 AM

## 2012-02-12 NOTE — Discharge Instructions (Signed)
Activity: 1.May walk up steps                2.No lifting more than ten pounds for four weeks.                 3.No driving for four weeks.                4.Stop any activity that causes chest pain, shortness of breath, dizziness, sweating or excessive weakness.                5.Avoid straining.                6.Continue with your breathing exercises daily.  Diet: Diabetic diet and Low fat, Low salt diet  Wound Care: May shower.  Clean wounds with mild soap and water daily. Contact the office at [phone] if any problems arise. Coronary Artery Bypass Grafting Care After Refer to this sheet in the next few weeks. These instructions provide you with information on caring for yourself after your procedure. Your caregiver may also give you more specific instructions. Your treatment has been planned according to current medical practices, but problems sometimes occur. Call your caregiver if you have any problems or questions after your procedure.  Recovery from open heart surgery will be different for everyone. Some people feel well after 3 or 4 weeks, while for others it takes longer. After heart surgery, it may be normal to:  Not have an appetite, feel nauseated by the smell of food, or only want to eat a small amount.   Be constipated because of changes in your diet, activity, and medicines. Eat foods high in fiber. Add fresh fruits and vegetables to your diet. Stool softeners may be helpful.   Feel sad or unhappy. You may be frustrated or cranky. You may have good days and bad days. Do not give up. Talk to your caregiver if you do not feel better.   Feel weakness and fatigue. You many need physical therapy or cardiac rehabilitation to get your strength back.   Develop an irregular heartbeat called atrial fibrillation. Symptoms of atrial fibrillation are a fast, irregular heartbeat or feelings of fluttery heartbeats, shortness of breath, low blood pressure, and dizziness. If these symptoms  develop, see your caregiver right away.  MEDICATION  Have a list of all the medicines you will be taking when you leave the hospital. For every medicine, know the following:   Name.   Exact dose.   Time of day to be taken.   How often it should be taken.   Why you are taking it.   Ask which medicines should or should not be taken together. If you take more than one heart medicine, ask if it is okay to take them together. Some heart medicines should not be taken at the same time because they may lower your blood pressure too much.   Narcotic pain medicine can cause constipation. Eat fresh fruits and vegetables. Add fiber to your diet. Stool softener medicine may help relieve constipation.   Keep a copy of your medicines with you at all times.   Do not add or stop taking any medicine until you check with your caregiver.   Medicines can have side effects. Call your caregiver who prescribed the medicine if you:   Start throwing up, have diarrhea, or have stomach pain.   Feel dizzy or lightheaded when you stand up.   Feel your heart is skipping beats or is beating   too fast or too slow.   Develop a rash.   Notice unusual bruising or bleeding.  HOME CARE INSTRUCTIONS  After heart surgery, it is important to learn how to take your pulse. Have your caregiver show you how to take your pulse.   Use your incentive spirometer. Ask your caregiver how long after surgery you need to use it.  Care of your chest incision  Tell your caregiver right away if you notice clicking in your chest (sternum).   Support your chest with a pillow or your arms when you take deep breaths and cough.   Follow your caregiver's instructions about when you can bathe or swim.   Protect your incision from sunlight during the first year to keep the scar from getting dark.   Tell your caregiver if you notice:   Increased tenderness of your incision.   Increased redness or swelling around your incision.     Drainage or pus from your incision.  Care of your leg incision(s)  Avoid crossing your legs.   Avoid sitting for long periods of time. Change positions every half hour.   Elevate your leg(s) when you are sitting.   Check your leg(s) daily for swelling. Check the incisions for redness or drainage.   Wear your elastic stockings as told by your caregiver. Take them off at bedtime.  Diet  Diet is very important to heart health.   Eat plenty of fresh fruits and vegetables. Meats should be lean cut. Avoid canned, processed, and fried foods.   Talk to a dietician. They can teach you how to make healthy food and drink choices.  Weight  Weigh yourself every day. This is important because it helps to know if you are retaining fluid that may make your heart and lungs work harder.   Use the same scale each time.   Weigh yourself every morning at the same time. You should do this after you go to the bathroom, but before you eat breakfast.   Your weight will be more accurate if you do not wear any clothes.   Record your weight.   Tell your caregiver if you have gained 2 pounds or more overnight.  Activity Stop any activity at once if you have chest pain, shortness of breath, irregular heartbeats, or dizziness. Get help right away if you have any of these symptoms.  Bathing.  Avoid soaking in a bath or hot tub until your incisions are healed.   Rest. You need a balance of rest and activity.   Exercise. Exercise per your caregiver's advice. You may need physical therapy or cardiac rehabilitation to help strengthen your muscles and build your endurance.   Climbing stairs. Unless your caregiver tells you not to climb stairs, go up stairs slowly and rest if you tire. Do not pull yourself up by the handrail.   Driving a car. Follow your caregiver's advice on when you may drive. You may ride as a passenger at any time. When traveling for long periods of time in a car, get out of the car and  walk around for a few minutes every 2 hours.   Lifting. Avoid lifting, pushing, or pulling anything heavier than 10 pounds for 6 weeks after surgery or as told by your caregiver.   Returning to work. Check with your caregiver. People heal at different rates. Most people will be able to go back to work 6 to 12 weeks after surgery.   Sexual activity. You may resume sexual relations as   told by your caregiver.  SEEK MEDICAL CARE IF:  Any of your incisions are red, painful, or have any type of drainage coming from them.   You have an oral temperature above 102 F (38.9 C).   You have ankle or leg swelling.   You have pain in your legs.   You have weight gain of 2 or more pounds a day.   You feel dizzy or lightheaded when you stand up.  SEEK IMMEDIATE MEDICAL CARE IF:  You have angina or chest pain that goes to your jaw or arms. Call your local emergency services right away.   You have shortness of breath at rest or with activity.   You have a fast or irregular heartbeat (arrhythmia).   There is a "clicking" in your sternum when you move.   You have numbness or weakness in your arms or legs.  MAKE SURE YOU:  Understand these instructions.   Will watch your condition.   Will get help right away if you are not doing well or get worse.  Document Released: 06/16/2005 Document Revised: 11/16/2011 Document Reviewed: 02/01/2011 Exit[employer] Patient Information 2012 [employer].Aortic Valve Replacement Care After Read the instructions outlined below and refer to this sheet for the next few weeks. These discharge instructions provide you with general information on caring for yourself after you leave the hospital. Your surgeon may also give you specific instructions. While your treatment has been planned according to the most current medical practices available, unavoidable complications occasionally occur. If you have any problems or questions after discharge, please call your  surgeon. AFTER THE PROCEDURE  Full recovery from heart valve surgery can take several months.   Blood thinning (anticoagulation) treatment with warfarin is often prescribed for 6 weeks to 3 months after surgery for those with biological valves. It is prescribed for life for those with mechanical valves.   Recovery includes healing of the surgical incision. There is a gradual building of stamina and exercise abilities. An exercise program under the direction of a physical therapist may be recommended.   Once you have an artificial valve, your heart function and your life will return to normal. You usually feel better after surgery. Shortness of breath and fatigue should lessen. If your heart was already severely damaged before your surgery, you may continue to have problems.   You can usually resume most of your normal activities. You will have to continue to monitor your condition. You need to watch out for blood clots and infections.   Artificial valves need to be replaced after a period of time. It is important that you see your caregiver regularly.   Some individuals with an aortic valve replacement need to take antibiotics before having dental work or other surgical procedures. This is called prophylactic antibiotic treatment. These drugs help to prevent infective endocarditis. Antibiotics are only recommended for individuals with the highest risk for developing infective endocarditis. Let your dentist and your caregiver know if you have a history of any of the following so that the necessary precautions can be taken:   A VSD.   A repaired VSD.   Endocarditis in the past.   An artificial (prosthetic) heart valve.  HOME CARE INSTRUCTIONS   Use all medications as prescribed.   Take your temperature every morning for the first week after surgery. Record these.   Weigh yourself every morning for at least the first week after surgery and record.   Do not lift more than 10 pounds (4.5    kg) until your breastbone (sternum) has healed. Avoid all activities which would place strain on your incision.   You may shower as soon as directed by your caregiver after surgery. Pat incisions dry. Do not rub incisions with washcloth or towel.   Avoid driving for 4 to 6 weeks following surgery or as instructed.   Use your elastic stockings during the day. You should wear the stockings for at least 2 weeks after discharge or longer if your ankles are swollen. The stockings help blood flow and help reduce swelling in the legs. It is easiest to put the stockings on before you get out of bed in the morning. They should fit snugly.  Pain Control  If a prescription was given for a pain reliever, please follow your doctor's directions.   If the pain is not relieved by your medicine, becomes worse, or you have difficulty breathing, call your surgeon.  Activity  Take frequent rest periods throughout the day.   Wait one week before returning to strenuous activities such as heavy lifting (more than 10 pounds), pushing or pulling.   Talk with your doctor about when you may return to work and your exercise routine.   Do not drive while taking prescription pain medication.  Nutrition  You may resume your normal diet.   Drink plenty of fluids (6-8 glasses a day).   Eat a well-balanced diet.   Call your caregiver for persistent nausea or vomiting.  Elimination Your normal bowel function should return. If constipation should occur, you may:  Take a mild laxative.   Add fruit and bran to your diet.   Drink more fluids.   Call your doctor if constipation is not relieved.  SEEK IMMEDIATE MEDICAL CARE IF:   You develop chest pain which is not coming from your surgical cut (incision).   You develop shortness of breath or have difficulty breathing.   You develop a temperature over 101 F (38.3 C).   You have a sudden weight gain. Let your caregiver know what the weight gain is.   You  develop a rash.   You develop any reaction or side effects to medications given.   You have increased bleeding from wounds.   You see redness, swelling, or have increasing pain in wounds.   You have pus coming from your wound.   You develop lightheadedness or feel faint.  Document Released: 06/15/2005 Document Revised: 11/16/2011 Document Reviewed: 09/06/2005 Exit[employer] Patient Information 2012 [employer].    

## 2012-02-12 NOTE — Progress Notes (Signed)
   CARE MANAGEMENT NOTE 02/12/2012  Patient:  [name] [name],[name] [name]   Account Number:  [id number]  Date Initiated:  02/08/2012  Documentation initiated by:  Avie Arenas  Subjective/Objective Assessment:   Post op CABG x3.  has spouse.     Action/Plan:   PTA, PT INDEPENDENT, LIVES WITH SPOUSE.  PLANS TO DC HOME WITH SPOUSE AS CAREGIVER.   Anticipated DC Date:  02/13/2012   Anticipated DC Plan:  HOME W HOME HEALTH SERVICES      DC Planning Services  CM consult      [facility] Choice  HOME HEALTH   Choice offered to / List presented to:  C-1 Patient        HH arranged  HH-1 RN      [facility] agency  [facility]   Status of service:  In process, will continue to follow Medicare Important Message given?   (If response is "NO", the following Medicare IM given date fields will be blank) Date Medicare IM given:   Date Additional Medicare IM given:    Discharge Disposition:  HOME W HOME HEALTH SERVICES  Per UR Regulation:  Reviewed for med. necessity/level of care/duration of stay  Comments:  02/12/12 Elyse Prevo,RN,BSN 1224 MET WITH PT AND WIFE; PT PROGRESSING WELL, INTERESTED IN HHRN AT DC.  WILL OBTAIN ORDER--THEY REQUEST [facility] FOR HOME HEALTH FOLLOW UP.  START OF CARE 24-48HR POST DC DATE. REFERRAL TO [facility]. Phone #[phone]  02/08/12 Alucard Fearnow,RN,BSN 1515 MET WITH PT TO DISCUSS HOME NEEDS.  PT STATES WIFE WILL PROVIDE 24HR CARE AT DISCHARGE.  HE HAS A WALKER AT HOME IF NEEDED.  WILL FOLLOW FOR HOME NEEDS AS PT PROGRESSES. Phone #[phone]

## 2012-02-13 ENCOUNTER — Inpatient Hospital Stay (HOSPITAL_COMMUNITY): Payer: Medicare Other

## 2012-02-13 DIAGNOSIS — R0602 Shortness of breath: Secondary | ICD-10-CM | POA: Diagnosis not present

## 2012-02-13 DIAGNOSIS — J90 Pleural effusion, not elsewhere classified: Secondary | ICD-10-CM | POA: Diagnosis not present

## 2012-02-13 LAB — BASIC METABOLIC PANEL
CO2: 26 mEq/L (ref 19–32)
Calcium: 9.2 mg/dL (ref 8.4–10.5)
Creatinine, Ser: 1.74 mg/dL — ABNORMAL HIGH (ref 0.50–1.35)
GFR calc Af Amer: 40 mL/min — ABNORMAL LOW (ref >90)
Sodium: 136 mEq/L (ref 135–145)

## 2012-02-13 LAB — GLUCOSE, CAPILLARY
Glucose-Capillary: 127 mg/dL — ABNORMAL HIGH (ref 70–99)
Glucose-Capillary: 198 mg/dL — ABNORMAL HIGH (ref 70–99)

## 2012-02-13 MED ORDER — POTASSIUM CHLORIDE CRYS ER 20 MEQ PO TBCR
40.0000 meq | EXTENDED_RELEASE_TABLET | Freq: Every day | ORAL | Status: AC
  Administered 2012-02-13 – 2012-02-15 (×3): 40 meq via ORAL
  Filled 2012-02-13 (×2): qty 2

## 2012-02-13 MED ORDER — CARVEDILOL 12.5 MG PO TABS
12.5000 mg | ORAL_TABLET | Freq: Two times a day (BID) | ORAL | Status: DC
  Administered 2012-02-13 – 2012-02-14 (×2): 12.5 mg via ORAL
  Filled 2012-02-13 (×4): qty 1

## 2012-02-13 MED ORDER — GLIMEPIRIDE 1 MG PO TABS
1.0000 mg | ORAL_TABLET | Freq: Every day | ORAL | Status: DC
  Administered 2012-02-14 – 2012-02-15 (×2): 1 mg via ORAL
  Filled 2012-02-13 (×3): qty 1

## 2012-02-13 MED ORDER — PREDNISONE 10 MG PO TABS
10.0000 mg | ORAL_TABLET | Freq: Every day | ORAL | Status: DC
  Administered 2012-02-14 – 2012-02-15 (×2): 10 mg via ORAL
  Filled 2012-02-13 (×3): qty 1

## 2012-02-13 MED FILL — Albumin, Human Inj 5%: INTRAVENOUS | Qty: 250 | Status: AC

## 2012-02-13 MED FILL — Heparin Sodium (Porcine) Inj 1000 Unit/ML: INTRAMUSCULAR | Qty: 10 | Status: AC

## 2012-02-13 MED FILL — Sodium Chloride IV Soln 0.9%: INTRAVENOUS | Qty: 1000 | Status: AC

## 2012-02-13 MED FILL — Heparin Sodium (Porcine) Inj 1000 Unit/ML: INTRAMUSCULAR | Qty: 30 | Status: AC

## 2012-02-13 MED FILL — Sodium Chloride Irrigation Soln 0.9%: Qty: 3000 | Status: AC

## 2012-02-13 MED FILL — Lidocaine HCl IV Inj 20 MG/ML: INTRAVENOUS | Qty: 10 | Status: AC

## 2012-02-13 MED FILL — Mannitol IV Soln 20%: INTRAVENOUS | Qty: 500 | Status: AC

## 2012-02-13 MED FILL — Electrolyte-R (PH 7.4) Solution: INTRAVENOUS | Qty: 6000 | Status: AC

## 2012-02-13 MED FILL — Sodium Bicarbonate IV Soln 8.4%: INTRAVENOUS | Qty: 50 | Status: AC

## 2012-02-13 NOTE — Progress Notes (Signed)
CARDIAC REHAB PHASE I   PRE:  Rate/Rhythm: 66SR  BP:  Supine:   Sitting: 130/70  Standing:    SaO2: 94%RA  MODE:  Ambulation: 550 ft   POST:  Rate/Rhythem: 66  BP:  Supine: 140/90  Sitting:   Standing:    SaO2: 100%RA 1050-1108 Pt walked 550 ft on RA with handheld asst. Gait steady. Limps on right leg. Stated this was from old CVA. To bed after walk. Did not want to increase distance this walk. To bed for pacing wire removal.  [name] [name]

## 2012-02-13 NOTE — Progress Notes (Addendum)
                    [address]            [place] [address]          [phone]     6 Days Post-Op Procedure(s) (LRB): AORTIC VALVE REPLACEMENT (AVR) (N/A) CORONARY ARTERY BYPASS GRAFTING (CABG) (N/A)  Subjective: Breathing was "rough" last night, but feels much better this morning.  Family notices a difference in that he can talk without dyspneic.  Voiding without problem.   Objective: Vital signs in last 24 hours: Patient Vitals for the past 24 hrs:  BP Temp Temp src Pulse Resp SpO2 Weight  02/13/12 0403 158/73 mmHg 98.6 F (37 C) Oral 69  18  99 % 70.534 kg (155 lb 8 oz)  02/12/12 2143 187/91 mmHg 98.4 F (36.9 C) Axillary 77  18  95 % -  02/12/12 1419 110/71 mmHg 98.5 F (36.9 C) Oral 67  18  95 % -   Current Weight  02/13/12 70.534 kg (155 lb 8 oz)  Pre-op wt= 66 kg  Intake/Output from previous day: 03/04 0701 - 03/05 0700 In: 492 [P.O.:492] Out: 900 [Urine:900]  CBGs [phone]  PHYSICAL EXAM:  Heart: RRR Lungs: decreased BS in bases Wound: clean and dry Extremities: bilat LE edema, R>L  Lab Results: CBC:No results found for this basename: WBC:2,HGB:2,HCT:2,PLT:2 in the last 72 hours BMET:  Basename 02/13/12 0502 02/12/12 0500  NA 136 137  K 4.2 3.5  CL 102 106  CO2 26 24  GLUCOSE 128* 72  BUN 27* 26*  CREATININE 1.74* 1.68*  CALCIUM 9.2 8.2*    PT/INR: No results found for this basename: LABPROT,INR in the last 72 hours  CXR- small bilateral effusions, L>R  Assessment/Plan: S/P Procedure(s) (LRB): AORTIC VALVE REPLACEMENT (AVR) (N/A) CORONARY ARTERY BYPASS GRAFTING (CABG) (N/A) Vol overload- wt down 2 kg/24 hrs and breathing better.  Continue diuresis. GU- voiding without difficulty.  Cont Flomax. CV- BPs trending up.  Will increase Coreg to 12.5 mg (was on 25 at home) and watch BPs. DM- continue SSI and watch . Metformin on hold secondary to increased Cr. ARI- Cr up slightly- monitor.    LOS: 6 days     COLLINS,GINA H 02/13/2012   He feels better today after diuresis and lost 2 lbs overnight.  CXR shows small bilat pleural effusions and bibasilar atel.  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  With renal dysfunction we can't use NSAID so will give a few days of prednisone.  It may raise his blood glucose but we can deal with that. With his renal dysfunction I would avoid restarting Metformin.  Will start low dose Amaryl and follow. I would not send him home until completely diuresed, especially with renal dysfunction.

## 2012-02-13 NOTE — Progress Notes (Signed)
Patient stated that he felt better after receiving his 80mg  lasix po medicine last night. Will continue to monitor.

## 2012-02-13 NOTE — Progress Notes (Signed)
Pacing wires pulled at 1115. All wires intact. Pt tolerated procedure well. No s/s of distress. No arrhythmias. Site WNL. Vital signs stable. Call bell within reach. Bedrest until 1215. Dion Saucier

## 2012-02-14 LAB — BASIC METABOLIC PANEL
BUN: 29 mg/dL — ABNORMAL HIGH (ref 6–23)
CO2: 25 mEq/L (ref 19–32)
Chloride: 102 mEq/L (ref 96–112)
Creatinine, Ser: 1.8 mg/dL — ABNORMAL HIGH (ref 0.50–1.35)
GFR calc Af Amer: 38 mL/min — ABNORMAL LOW (ref >90)
Glucose, Bld: 123 mg/dL — ABNORMAL HIGH (ref 70–99)
Potassium: 4.1 mEq/L (ref 3.5–5.1)

## 2012-02-14 MED ORDER — FUROSEMIDE 80 MG PO TABS
80.0000 mg | ORAL_TABLET | Freq: Every day | ORAL | Status: DC
  Administered 2012-02-15: 80 mg via ORAL
  Filled 2012-02-14 (×2): qty 1

## 2012-02-14 MED ORDER — CARVEDILOL 25 MG PO TABS
25.0000 mg | ORAL_TABLET | Freq: Two times a day (BID) | ORAL | Status: DC
  Administered 2012-02-14 – 2012-02-15 (×2): 25 mg via ORAL
  Filled 2012-02-14 (×5): qty 1

## 2012-02-14 NOTE — Progress Notes (Signed)
Pt walking independently with wife. Doing well. Ed completed. Requests his referral be sent to [facility]. Pt sts he will walk with his wife.  [phone]

## 2012-02-14 NOTE — Progress Notes (Signed)
                    [address]            [place] [address]          [phone]     7 Days Post-Op Procedure(s) (LRB): AORTIC VALVE REPLACEMENT (AVR) (N/A) CORONARY ARTERY BYPASS GRAFTING (CABG) (N/A)  Subjective: Walked x3 yesterday.  Feeling well, no complaints.  Objective: Vital signs in last 24 hours: Patient Vitals for the past 24 hrs:  BP Temp Temp src Pulse Resp SpO2 Weight  02/14/12 0634 136/70 mmHg 98.4 F (36.9 C) Oral 67  18  94 % 67.6 kg (149 lb 0.5 oz)  02/13/12 2106 138/69 mmHg 97.9 F (36.6 C) Oral 68  18  95 % -  02/13/12 1359 132/71 mmHg 97.7 F (36.5 C) Oral 64  18  95 % -  02/13/12 1112 152/65 mmHg - - 61  20  99 % -   Current Weight  02/14/12 67.6 kg (149 lb 0.5 oz)   Pre-op wt= 66 kg   Intake/Output from previous day: 03/05 0701 - 03/06 0700 In: 720 [P.O.:720] Out: 1900 [Urine:1900]  CBGs [phone]  PHYSICAL EXAM:  Heart: RRR, soft rub Lungs: few fine basilar crackles, overall clear Wound: clean and dry Extremities: mild RLE edema, LLE nearly resolved  Lab Results: CBC:No results found for this basename: WBC:2,HGB:2,HCT:2,PLT:2 in the last 72 hours BMET:  [facility] 02/14/12 0537 02/13/12 0502  NA 136 136  K 4.1 4.2  CL 102 102  CO2 25 26  GLUCOSE 123* 128*  BUN 29* 27*  CREATININE 1.80* 1.74*  CALCIUM 8.4 9.2    PT/INR: No results found for this basename: LABPROT,INR in the last 72 hours   Assessment/Plan: S/P Procedure(s) (LRB): AORTIC VALVE REPLACEMENT (AVR) (N/A) CORONARY ARTERY BYPASS GRAFTING (CABG) (N/A) CV- stable.  BPs still slightly elevated.  Will resume home dose of Coreg. Vol overload- diuresed 1900 cc yesterday, wt down 3 kg.  Continue Lasix- will decrease to 80 mg daily in light of renal function and since he is almost back to pre-op wt. ARI- Cr continues to trend up.  Monitor. Decrease Lasix dose. Prob pericarditis- continue steroid taper. DM- now on Amaryl.  Watch CBGs while on  steroids. GU- stable, cont Flomax.   LOS: 7 days    Aniylah Avans H 02/14/2012

## 2012-02-15 DIAGNOSIS — I2510 Atherosclerotic heart disease of native coronary artery without angina pectoris: Secondary | ICD-10-CM | POA: Diagnosis not present

## 2012-02-15 DIAGNOSIS — I359 Nonrheumatic aortic valve disorder, unspecified: Secondary | ICD-10-CM | POA: Diagnosis not present

## 2012-02-15 LAB — GLUCOSE, CAPILLARY
Glucose-Capillary: 112 mg/dL — ABNORMAL HIGH (ref 70–99)
Glucose-Capillary: 137 mg/dL — ABNORMAL HIGH (ref 70–99)

## 2012-02-15 LAB — BASIC METABOLIC PANEL
BUN: 29 mg/dL — ABNORMAL HIGH (ref 6–23)
Chloride: 100 mEq/L (ref 96–112)
Glucose, Bld: 112 mg/dL — ABNORMAL HIGH (ref 70–99)
Potassium: 3.8 mEq/L (ref 3.5–5.1)
Sodium: 138 mEq/L (ref 135–145)

## 2012-02-15 MED ORDER — PREDNISONE 10 MG PO TABS: 10.0000 mg | ORAL_TABLET | Freq: Every day | ORAL | Status: DC

## 2012-02-15 MED ORDER — CARVEDILOL 25 MG PO TABS: 25.0000 mg | ORAL_TABLET | Freq: Two times a day (BID) | ORAL | Status: DC

## 2012-02-15 MED ORDER — FUROSEMIDE 40 MG PO TABS: 40.0000 mg | ORAL_TABLET | Freq: Every day | ORAL | Status: DC

## 2012-02-15 MED ORDER — GLIMEPIRIDE 1 MG PO TABS: 1.0000 mg | ORAL_TABLET | Freq: Every day | ORAL | Status: DC

## 2012-02-15 MED ORDER — POTASSIUM CHLORIDE CRYS ER 20 MEQ PO TBCR: 20.0000 meq | EXTENDED_RELEASE_TABLET | Freq: Every day | ORAL | Status: DC

## 2012-02-15 NOTE — Progress Notes (Signed)
Pt. Discharged 02/15/2012  11:34 AM Discharge instructions reviewed with patient/family. Patient/family verbalized understanding. All Rx's given. Questions answered as needed. Pt. Discharged to home with family/self.  Joselito Fieldhouse, Randall An  RN

## 2012-02-15 NOTE — Progress Notes (Signed)
Subjective:  Feels well.  Denies any chest pain.  Objective:  Vital Signs in the last 24 hours: Temp:  [96.7 F (35.9 C)-98.5 F (36.9 C)] 96.7 F (35.9 C) (03/07 0827) Pulse Rate:  [59-76] 66  (03/07 0827) Resp:  [18-19] 18  (03/07 0827) BP: (122-156)/(63-70) 152/69 mmHg (03/07 0827) SpO2:  [92 %-98 %] 96 % (03/07 0827) Weight:  [146 lb 2.6 oz (66.3 kg)] 146 lb 2.6 oz (66.3 kg) (03/07 0609)  Intake/Output from previous day: 03/06 0701 - 03/07 0700 In: 240 [P.O.:240] Out: 1500 [Urine:1500] Intake/Output from this shift:       . aspirin EC  325 mg Oral Daily  . carvedilol  25 mg Oral BID WC  . cholecalciferol  2,000 Units Oral Daily  . docusate sodium  200 mg Oral Daily  . furosemide  80 mg Oral Daily  . glimepiride  1 mg Oral Q breakfast  . insulin aspart  0-24 Units Subcutaneous TID AC & HS  . mupirocin ointment   Nasal BID  . pantoprazole  40 mg Oral QAC breakfast  . potassium chloride  40 mEq Oral Daily  . predniSONE  10 mg Oral Q breakfast  . simvastatin  10 mg Oral QHS  . sodium chloride  3 mL Intravenous Q12H  . Tamsulosin HCl  0.4 mg Oral QPC supper  . timolol  1 drop Both Eyes BID      . sodium chloride Stopped (02/12/12 0500)    Physical Exam: The patient appears to be in no distress.  Head and neck exam reveals that the pupils are equal and reactive.  The extraocular movements are full.  There is no scleral icterus.  Mouth and pharynx are benign.  No lymphadenopathy.  No carotid bruits.  The jugular venous pressure is normal.  Thyroid is not enlarged or tender.  Chest is clear to percussion and auscultation.  No rales or rhonchi.  Mild decrease in breath sounds at left base.  Heart reveals no abnormal lift or heave.  First and second heart sounds are normal.  There is a two- component pericardial friction rub along left sternal edge (asymptomatic).  The abdomen is soft and nontender.  Bowel sounds are normoactive.  There is no hepatosplenomegaly or  mass.  There are no abdominal bruits.  Extremities reveal Mild residual edema right lower leg.  Pedal pulses are good.  There is no cyanosis or clubbing.  Neurologic exam is normal strength and no lateralizing weakness.  No sensory deficits.  Integument reveals no rash  Lab Results: No results found for this basename: WBC:2,HGB:2,PLT:2 in the last 72 hours  Basename 02/15/12 0520 02/14/12 0537  NA 138 136  K 3.8 4.1  CL 100 102  CO2 29 25  GLUCOSE 112* 123*  BUN 29* 29*  CREATININE 1.86* 1.80*   No results found for this basename: TROPONINI:2,CK,MB:2 in the last 72 hours Hepatic Function Panel No results found for this basename: PROT,ALBUMIN,AST,ALT,ALKPHOS,BILITOT,BILIDIR,IBILI in the last 72 hours No results found for this basename: CHOL in the last 72 hours No results found for this basename: PROTIME in the last 72 hours  Imaging: Imaging results have been reviewed. Last xray 02/13/12.  Cardiac Studies:  Assessment/Plan:  Patient Active Hospital Problem List: Severe Aortic Stenosis (02/03/2009)   Assessment: Doing well.  Rhythm stable. Weight down another 3 lb overnight. Asymptomatic pericardial rub.   Plan: Home soon.  Should follow-up with Dr. Daleen Squibb in about 2 weeks.   LOS: 8 days  Cassell Clement 02/15/2012, 9:41 AM

## 2012-02-15 NOTE — Progress Notes (Signed)
UR Completed.   [name] [name] [phone] 02/15/2012  

## 2012-02-15 NOTE — Progress Notes (Addendum)
                    [address]            [place] [address]          [phone]     8 Days Post-Op Procedure(s) (LRB): AORTIC VALVE REPLACEMENT (AVR) (N/A) CORONARY ARTERY BYPASS GRAFTING (CABG) (N/A)  Subjective: Feels well.  Wants to go home today.  Objective: Vital signs in last 24 hours: Patient Vitals for the past 24 hrs:  BP Temp Temp src Pulse Resp SpO2 Weight  02/15/12 0827 152/69 mmHg 96.7 F (35.9 C) Oral 66  18  96 % -  02/15/12 0609 156/70 mmHg 97.6 F (36.4 C) Oral 76  18  98 % 66.3 kg (146 lb 2.6 oz)  02/14/12 2124 122/63 mmHg 98.1 F (36.7 C) Oral 59  19  92 % -  02/14/12 1331 123/64 mmHg 98.5 F (36.9 C) Oral 68  18  96 % -   Current Weight  02/15/12 66.3 kg (146 lb 2.6 oz)  Pre-op wt= 66 kg    Intake/Output from previous day: 03/06 0701 - 03/07 0700 In: 240 [P.O.:240] Out: 1500 [Urine:1500]  CBGs 150-149-112-112  PHYSICAL EXAM:  Heart: RRR Lungs: generally clear Wound: clean and dry Extremities: trace LE edema   Lab Results: CBC:No results found for this basename: WBC:2,HGB:2,HCT:2,PLT:2 in the last 72 hours BMET:  Basename 02/15/12 0520 02/14/12 0537  NA 138 136  K 3.8 4.1  CL 100 102  CO2 29 25  GLUCOSE 112* 123*  BUN 29* 29*  CREATININE 1.86* 1.80*  CALCIUM 8.6 8.4    PT/INR: No results found for this basename: LABPROT,INR in the last 72 hours   Assessment/Plan: S/P Procedure(s) (LRB): AORTIC VALVE REPLACEMENT (AVR) (N/A) CORONARY ARTERY BYPASS GRAFTING (CABG) (N/A) CV- stable. Vol overload- back to preop weight and LE edema nearly resolved. ARI- Cr stable.  Pericarditis- steroid taper. DM- sugars stable on Amaryl. GU- stable, cont Flomax. R eye has area of lateral erythema, prominent blood vessels.  Pt has had this multiple times in the past and his ophthalmologist is aware and not concerned (feels it is related to cataracts/eye surgery).  Watch. Possible d/c later today or in am.    LOS: 8 days     Hawkins Seaman H 02/15/2012  ADDENDUM: discussed with Dr. Dorris Fetch.  Since he is back to baseline weight and his edema is significantly improved, we will plan discharge home today.

## 2012-02-15 NOTE — Discharge Summary (Signed)
[address]            [name] [address]          [phone]         Discharge Summary:ADDENDUM  Name: [name] [name] DOB: 06-Apr-1926 78 y.o. MRN: [id number]  Admission Date: 02/07/2012 Discharge Date: 02/15/2012   Additional Discharge Diagnoses:   Postoperative acute renal insufficiency  Post pericardiotomy syndrome  Postoperative urinary retention, resolved  Postoperative volume overload  Bilateral pleural effusions   Additional Hospital Course:   The patient was originally scheduled for discharge home on 02/12/2012. However on morning rounds evaluation he was noted to be significantly volume overloaded, short of breath, approximately 6 kg above his preoperative weight increased lower extremity edema on physical exam. He remained in the hospital for aggressive diuresis with IV Lasix. Chest x-ray was obtained which showed stable small bilateral pleural effusions, left greater than right. Also his renal function has continued to worsen, with creatinine bumping up to 1.8. Because of his acute renal insufficiency his metformin was not restarted and he was started on He also developed a pericardial rub, it was felt to have a post pericardiotomy syndrome which could be contributing to his effusions. He was started on a short prednisone taper.  Over the next several days, the patient made slow but steady progress. He has been diuresed back to his preoperative weight and his lower extremity edema is almost completely resolved. He still has a soft rub on exam, but his breathing has remained stable and his effusions are decreasing with diuresis. His renal function is stable. He is voiding without difficulty. His blood sugars are stable on Amaryl. He has been seen and evaluated on the morning of this dictation and is deemed ready for discharge home at this time.     Recent vital signs:  Filed Vitals:   02/15/12 0827  BP: 152/69  Pulse: 66    Temp: 96.7 F (35.9 C)  Resp: 18    Recent laboratory studies:  CBC:No results found for this basename: WBC:2,HGB:2,HCT:2,PLT:2 in the last 72 hours BMET:  Basename 02/15/12 0520 02/14/12 0537  NA 138 136  K 3.8 4.1  CL 100 102  CO2 29 25  GLUCOSE 112* 123*  BUN 29* 29*  CREATININE 1.86* 1.80*  CALCIUM 8.6 8.4    PT/INR: No results found for this basename: LABPROT,INR in the last 72 hours  Discharge Medications:   Medication List  As of 02/15/2012 10:44 AM   STOP taking these medications         aspirin 81 MG chewable tablet      isosorbide mononitrate 30 MG 24 hr tablet      metFORMIN 500 MG tablet      Ticagrelor 90 MG Tabs tablet         TAKE these medications         aspirin 325 MG EC tablet   Take 1 tablet (325 mg total) by mouth daily.      carvedilol 25 MG tablet   Commonly known as: COREG   Take 1 tablet (25 mg total) by mouth 2 (two) times daily with a meal.      cholecalciferol 1000 UNITS tablet   Commonly known as: VITAMIN D   Take 2,000 Units by mouth daily.      furosemide 40 MG tablet   Commonly known as: LASIX  Take 1 tablet (40 mg total) by mouth daily.      glimepiride 1 MG tablet   Commonly known as: AMARYL   Take 1 tablet (1 mg total) by mouth daily with breakfast.      metipranolol 0.3 % ophthalmic solution   Commonly known as: OPTIPRANOLOL   Place 1 drop into both eyes 2 (two) times daily.      multivitamin capsule   Take 1 capsule by mouth daily.      omeprazole 20 MG capsule   Commonly known as: PRILOSEC   Take 20 mg by mouth daily.      potassium chloride SA 20 MEQ tablet   Commonly known as: K-DUR,KLOR-CON   Take 1 tablet (20 mEq total) by mouth daily.      predniSONE 10 MG tablet   Commonly known as: DELTASONE   Take 1 tablet (10 mg total) by mouth daily with breakfast. X 1 dose      simvastatin 10 MG tablet   Commonly known as: ZOCOR   Take 10 mg by mouth at bedtime.      Tamsulosin HCl 0.4 MG Caps   Commonly  known as: FLOMAX   Take 1 capsule (0.4 mg total) by mouth daily after supper.      traMADol 50 MG tablet   Commonly known as: ULTRAM   Take 1 tablet (50 mg total) by mouth every 12 (twelve) hours as needed for pain.            Discharge Instructions:  The patient is to refrain from driving, heavy lifting or strenuous activity.  May shower daily and clean incisions with soap and water.  May resume regular diet.  Discharge Orders    Future Appointments: Provider: Department: Dept Phone: Center:   02/22/2012 12:00 PM Valera Castle, MD [facility] [phone] [facility]   03/05/2012 2:30 PM [name], MD [facility] [phone] [facility]      Follow-up Information    Follow up with Valera Castle, MD. (Call for a follow up for 2 weeks)    Contact information:   [address] [place] [address] [phone]       Follow up with [name], MD on 03/05/2012. (Have a chest x-ray at 1:30, then see Dr. Laneta Simmers at 2:30 pm)    Contact information:   [address] [place] [address] [phone]           Tally Mckinnon H 02/15/2012, 10:44 AM

## 2012-02-16 DIAGNOSIS — I509 Heart failure, unspecified: Secondary | ICD-10-CM | POA: Diagnosis not present

## 2012-02-16 DIAGNOSIS — I10 Essential (primary) hypertension: Secondary | ICD-10-CM | POA: Diagnosis not present

## 2012-02-16 DIAGNOSIS — I214 Non-ST elevation (NSTEMI) myocardial infarction: Secondary | ICD-10-CM | POA: Diagnosis not present

## 2012-02-16 DIAGNOSIS — I2510 Atherosclerotic heart disease of native coronary artery without angina pectoris: Secondary | ICD-10-CM | POA: Diagnosis not present

## 2012-02-16 DIAGNOSIS — E119 Type 2 diabetes mellitus without complications: Secondary | ICD-10-CM | POA: Diagnosis not present

## 2012-02-16 DIAGNOSIS — I359 Nonrheumatic aortic valve disorder, unspecified: Secondary | ICD-10-CM | POA: Diagnosis not present

## 2012-02-19 DIAGNOSIS — I509 Heart failure, unspecified: Secondary | ICD-10-CM | POA: Diagnosis not present

## 2012-02-19 DIAGNOSIS — I2510 Atherosclerotic heart disease of native coronary artery without angina pectoris: Secondary | ICD-10-CM | POA: Diagnosis not present

## 2012-02-19 DIAGNOSIS — I10 Essential (primary) hypertension: Secondary | ICD-10-CM | POA: Diagnosis not present

## 2012-02-19 DIAGNOSIS — E119 Type 2 diabetes mellitus without complications: Secondary | ICD-10-CM | POA: Diagnosis not present

## 2012-02-19 DIAGNOSIS — I214 Non-ST elevation (NSTEMI) myocardial infarction: Secondary | ICD-10-CM | POA: Diagnosis not present

## 2012-02-19 DIAGNOSIS — I359 Nonrheumatic aortic valve disorder, unspecified: Secondary | ICD-10-CM | POA: Diagnosis not present

## 2012-02-22 ENCOUNTER — Ambulatory Visit (INDEPENDENT_AMBULATORY_CARE_PROVIDER_SITE_OTHER): Payer: Medicare Other | Admitting: Cardiology

## 2012-02-22 ENCOUNTER — Encounter: Payer: Self-pay | Admitting: Cardiology

## 2012-02-22 VITALS — BP 130/68 | HR 64 | Ht 69.0 in | Wt 149.0 lb

## 2012-02-22 DIAGNOSIS — I2510 Atherosclerotic heart disease of native coronary artery without angina pectoris: Secondary | ICD-10-CM | POA: Diagnosis not present

## 2012-02-22 DIAGNOSIS — N189 Chronic kidney disease, unspecified: Secondary | ICD-10-CM

## 2012-02-22 DIAGNOSIS — I2589 Other forms of chronic ischemic heart disease: Secondary | ICD-10-CM

## 2012-02-22 DIAGNOSIS — I5022 Chronic systolic (congestive) heart failure: Secondary | ICD-10-CM

## 2012-02-22 DIAGNOSIS — D649 Anemia, unspecified: Secondary | ICD-10-CM

## 2012-02-22 DIAGNOSIS — E876 Hypokalemia: Secondary | ICD-10-CM

## 2012-02-22 DIAGNOSIS — I509 Heart failure, unspecified: Secondary | ICD-10-CM | POA: Diagnosis not present

## 2012-02-22 DIAGNOSIS — I255 Ischemic cardiomyopathy: Secondary | ICD-10-CM

## 2012-02-22 DIAGNOSIS — I359 Nonrheumatic aortic valve disorder, unspecified: Secondary | ICD-10-CM

## 2012-02-22 DIAGNOSIS — I10 Essential (primary) hypertension: Secondary | ICD-10-CM

## 2012-02-22 LAB — BASIC METABOLIC PANEL
BUN: 30 mg/dL — ABNORMAL HIGH (ref 6–23)
Calcium: 9.3 mg/dL (ref 8.4–10.5)
GFR: 32.86 mL/min — ABNORMAL LOW (ref >60.00)
Glucose, Bld: 108 mg/dL — ABNORMAL HIGH (ref 70–99)
Potassium: 5 mEq/L (ref 3.5–5.1)
Sodium: 139 mEq/L (ref 135–145)

## 2012-02-22 NOTE — Assessment & Plan Note (Signed)
We'll recheck potassium and kidney function.

## 2012-02-22 NOTE — Patient Instructions (Addendum)
Your physician recommends that you schedule a follow-up appointment in: 3 months with Dr. Daleen Squibb.  LABS TODAY:  BMET  You have been referred to Cardiac Rehab at [facility].

## 2012-02-22 NOTE — Progress Notes (Signed)
. HPI [name] [name] returns today for evaluation and management of recent aortic valve replacement severe aortic stenosis and coronary artery bypass grafting for severe three-vessel coronary artery disease.  His preoperative ejection fraction was only 25%. His multiple comorbidities including advanced age.  He's done well except for significant bruising in the right upper mid thigh graft harvesting. He has had some edema particularly lower extremity. He is on Lasix and potassium.  His wife is very supportive and would like to get him in cardiac rehabilitation. We will send a referral. Is not seen surgery back yet.  He denies any chest pain other than incisional pain. He denies orthopnea, PND or pleuritic chest pain.  Past Medical History  Diagnosis Date  . HTN (hypertension)   . History of colon cancer   . Aortic stenosis     Echocardiogram 12/13/11: EF 25-30%, posterolateral akinesis and diffuse hypokinesis, grade 2 diastolic dysfunction, moderate-severe AS, aortic valve area 1.03, mean gradient 16, mild-moderate MR, moderate LAE  . CAD (coronary artery disease)     s/p NSTEMI 12/2011:  LHC - Ostial left main 20%, ostial LAD 50%, mid 60-70%, ostial D1 40% and mid 40%, D2 70%, ostial circumflex occluded, ostial RCA 80-90%, LVEDP was 42;  plan eventually for CABG/AVR with Dr. Laneta Simmers  . Ischemic cardiomyopathy   . Chronic systolic heart failure   . CKD (chronic kidney disease)   . HLD (hyperlipidemia)   . DM2 (diabetes mellitus, type 2)   . Angina   . Heart murmur   . Cancer     colon resection  . Stroke 10/01  . GERD (gastroesophageal reflux disease)   . Diabetes mellitus     borderline  . Chronic kidney disease     Current Outpatient Prescriptions  Medication Sig Dispense Refill  . aspirin EC 325 MG EC tablet Take 1 tablet (325 mg total) by mouth daily.  30 tablet    . carvedilol (COREG) 25 MG tablet Take 1 tablet (25 mg total) by mouth 2 (two) times daily with a meal.  60 tablet   1  . cholecalciferol (VITAMIN D) 1000 UNITS tablet Take 2,000 Units by mouth daily.       Marland Kitchen glimepiride (AMARYL) 1 MG tablet Take 1 tablet (1 mg total) by mouth daily with breakfast.  30 tablet  1  . Multiple Vitamin (MULTIVITAMIN) capsule Take 1 capsule by mouth daily.        Marland Kitchen omeprazole (PRILOSEC) 20 MG capsule Take 20 mg by mouth daily.        . potassium chloride SA (K-DUR,KLOR-CON) 20 MEQ tablet Take 1 tablet (20 mEq total) by mouth daily.  14 tablet  0  . simvastatin (ZOCOR) 10 MG tablet Take 10 mg by mouth at bedtime.        . Tamsulosin HCl (FLOMAX) 0.4 MG CAPS Take 1 capsule (0.4 mg total) by mouth daily after supper.  30 capsule  1  . traMADol (ULTRAM) 50 MG tablet Take 1 tablet (50 mg total) by mouth every 12 (twelve) hours as needed for pain.  40 tablet  0  . furosemide (LASIX) 40 MG tablet Take 1 tablet (40 mg total) by mouth daily.  14 tablet  0  . metipranolol (OPTIPRANOLOL) 0.3 % ophthalmic solution Place 1 drop into both eyes 2 (two) times daily.         No Known Allergies  Family History  Problem Relation Age of Onset  . Cancer    . Heart attack    .  Diabetes    . Heart failure    . Cancer Mother   . Heart attack Father     History   Social History  . Marital Status: Married    Spouse Name: N/A    Number of Children: N/A  . Years of Education: N/A   Occupational History  . Not on file.   Social History Main Topics  . Smoking status: Never Smoker   . Smokeless tobacco: Never Used  . Alcohol Use: No  . Drug Use: No  . Sexually Active: Not Currently   Other Topics Concern  . Not on file   Social History Narrative  . No narrative on file    ROS ALL NEGATIVE EXCEPT THOSE NOTED IN HPI  PE  General Appearance: well developed, well nourished in no acute distress, elderly frail HEENT: symmetrical face, PERRLA,   Neck: no JVD, thyromegaly, or adenopathy, trachea midline Chest: symmetric without deformity Cardiac: PMI non-displaced, RRR, normal S1,  S2, no gallop or murmur Lung: clear to ausculation and percussion Vascular: all pulses full without bruits  Abdominal: nondistended, nontender, good bowel sounds, no HSM, no bruits Extremities: no cyanosis, 2+ pitting edema of the ankle on the right and 1+ on the left. Right inner thigh shows no acute gouty, no redness but you can feel subcutaneous hematoma. Is no discoloration now no sign of DVT, no varicosities  Skin: normal color, no rashes Neuro: alert and oriented x 3, non-focal Pysch: normal affect  EKG  BMET    Component Value Date/Time   NA 139 02/22/2012 1250   K 5.0 02/22/2012 1250   CL 98 02/22/2012 1250   CO2 33* 02/22/2012 1250   GLUCOSE 108* 02/22/2012 1250   BUN 30* 02/22/2012 1250   CREATININE 2.1* 02/22/2012 1250   CALCIUM 9.3 02/22/2012 1250   GFRNONAA 32* 02/15/2012 0520   GFRAA 37* 02/15/2012 0520    Lipid Panel     Component Value Date/Time   CHOL 124 12/14/2011 0400   TRIG 69 12/14/2011 0400   HDL 47 12/14/2011 0400   CHOLHDL 2.6 12/14/2011 0400   VLDL 14 12/14/2011 0400   LDLCALC 63 12/14/2011 0400    CBC    Component Value Date/Time   WBC 11.9* 02/10/2012 0655   RBC 3.32* 02/10/2012 0655   HGB 10.5* 02/10/2012 0655   HCT 31.0* 02/10/2012 0655   PLT 121* 02/10/2012 0655   MCV 93.4 02/10/2012 0655   MCH 31.6 02/10/2012 0655   MCHC 33.9 02/10/2012 0655   RDW 14.9 02/10/2012 0655

## 2012-02-22 NOTE — Assessment & Plan Note (Signed)
Status post aortic valve replacement with significant improvement in his symptoms.

## 2012-02-22 NOTE — Assessment & Plan Note (Signed)
Stable. Hopefully, his LV function with improved after revascularization and aortic valve replacement. I will repeat an echocardiogram in 6 months.

## 2012-02-23 DIAGNOSIS — I10 Essential (primary) hypertension: Secondary | ICD-10-CM | POA: Diagnosis not present

## 2012-02-23 DIAGNOSIS — E119 Type 2 diabetes mellitus without complications: Secondary | ICD-10-CM | POA: Diagnosis not present

## 2012-02-23 DIAGNOSIS — I359 Nonrheumatic aortic valve disorder, unspecified: Secondary | ICD-10-CM | POA: Diagnosis not present

## 2012-02-23 DIAGNOSIS — I509 Heart failure, unspecified: Secondary | ICD-10-CM | POA: Diagnosis not present

## 2012-02-23 DIAGNOSIS — I2510 Atherosclerotic heart disease of native coronary artery without angina pectoris: Secondary | ICD-10-CM | POA: Diagnosis not present

## 2012-02-23 DIAGNOSIS — I214 Non-ST elevation (NSTEMI) myocardial infarction: Secondary | ICD-10-CM | POA: Diagnosis not present

## 2012-02-24 DIAGNOSIS — I10 Essential (primary) hypertension: Secondary | ICD-10-CM | POA: Diagnosis not present

## 2012-02-24 DIAGNOSIS — I509 Heart failure, unspecified: Secondary | ICD-10-CM | POA: Diagnosis not present

## 2012-02-24 DIAGNOSIS — E119 Type 2 diabetes mellitus without complications: Secondary | ICD-10-CM | POA: Diagnosis not present

## 2012-02-24 DIAGNOSIS — I359 Nonrheumatic aortic valve disorder, unspecified: Secondary | ICD-10-CM | POA: Diagnosis not present

## 2012-02-24 DIAGNOSIS — I214 Non-ST elevation (NSTEMI) myocardial infarction: Secondary | ICD-10-CM | POA: Diagnosis not present

## 2012-02-24 DIAGNOSIS — I2510 Atherosclerotic heart disease of native coronary artery without angina pectoris: Secondary | ICD-10-CM | POA: Diagnosis not present

## 2012-02-26 ENCOUNTER — Other Ambulatory Visit: Payer: Self-pay

## 2012-02-26 MED ORDER — POTASSIUM CHLORIDE CRYS ER 20 MEQ PO TBCR: 20.0000 meq | EXTENDED_RELEASE_TABLET | Freq: Every day | ORAL | Status: DC

## 2012-02-27 ENCOUNTER — Other Ambulatory Visit: Payer: Self-pay | Admitting: Surgery

## 2012-02-27 DIAGNOSIS — I2510 Atherosclerotic heart disease of native coronary artery without angina pectoris: Secondary | ICD-10-CM | POA: Diagnosis not present

## 2012-02-27 DIAGNOSIS — I359 Nonrheumatic aortic valve disorder, unspecified: Secondary | ICD-10-CM | POA: Diagnosis not present

## 2012-02-27 DIAGNOSIS — I10 Essential (primary) hypertension: Secondary | ICD-10-CM | POA: Diagnosis not present

## 2012-02-27 DIAGNOSIS — I509 Heart failure, unspecified: Secondary | ICD-10-CM | POA: Diagnosis not present

## 2012-02-27 DIAGNOSIS — I214 Non-ST elevation (NSTEMI) myocardial infarction: Secondary | ICD-10-CM | POA: Diagnosis not present

## 2012-02-27 DIAGNOSIS — E119 Type 2 diabetes mellitus without complications: Secondary | ICD-10-CM | POA: Diagnosis not present

## 2012-03-01 DIAGNOSIS — I10 Essential (primary) hypertension: Secondary | ICD-10-CM | POA: Diagnosis not present

## 2012-03-01 DIAGNOSIS — I2510 Atherosclerotic heart disease of native coronary artery without angina pectoris: Secondary | ICD-10-CM | POA: Diagnosis not present

## 2012-03-01 DIAGNOSIS — I359 Nonrheumatic aortic valve disorder, unspecified: Secondary | ICD-10-CM | POA: Diagnosis not present

## 2012-03-01 DIAGNOSIS — E119 Type 2 diabetes mellitus without complications: Secondary | ICD-10-CM | POA: Diagnosis not present

## 2012-03-01 DIAGNOSIS — I509 Heart failure, unspecified: Secondary | ICD-10-CM | POA: Diagnosis not present

## 2012-03-01 DIAGNOSIS — I214 Non-ST elevation (NSTEMI) myocardial infarction: Secondary | ICD-10-CM | POA: Diagnosis not present

## 2012-03-05 ENCOUNTER — Ambulatory Visit (INDEPENDENT_AMBULATORY_CARE_PROVIDER_SITE_OTHER): Payer: Self-pay | Admitting: Surgery

## 2012-03-05 ENCOUNTER — Encounter: Payer: Self-pay | Admitting: Surgery

## 2012-03-05 ENCOUNTER — Ambulatory Visit
Admission: RE | Admit: 2012-03-05 | Discharge: 2012-03-05 | Disposition: A | Discharge status: Home / Self Care | Payer: Medicare Other | Source: Ambulatory Visit | Attending: Surgery | Admitting: Surgery

## 2012-03-05 VITALS — BP 170/74 | HR 64 | Resp 16 | Ht 69.0 in | Wt 136.0 lb

## 2012-03-05 DIAGNOSIS — I359 Nonrheumatic aortic valve disorder, unspecified: Secondary | ICD-10-CM

## 2012-03-05 DIAGNOSIS — J90 Pleural effusion, not elsewhere classified: Secondary | ICD-10-CM | POA: Diagnosis not present

## 2012-03-05 DIAGNOSIS — Z09 Encounter for follow-up examination after completed treatment for conditions other than malignant neoplasm: Secondary | ICD-10-CM

## 2012-03-05 DIAGNOSIS — Z951 Presence of aortocoronary bypass graft: Secondary | ICD-10-CM | POA: Diagnosis not present

## 2012-03-05 DIAGNOSIS — I2510 Atherosclerotic heart disease of native coronary artery without angina pectoris: Secondary | ICD-10-CM

## 2012-03-05 DIAGNOSIS — J9819 Other pulmonary collapse: Secondary | ICD-10-CM | POA: Diagnosis not present

## 2012-03-05 NOTE — Progress Notes (Signed)
                     [address]            [name] [address]          [phone]     HPI: Patient returns for routine postoperative follow-up having undergone CABG and AVR with a tissue valve on 02/07/12. The patient's early postoperative recovery while in the hospital was notable for an uncomplicated postop course. Since hospital discharge the patient reports that he has been feeling well.  He is ambulating without shortness of breath and doing 2500 on his IS.   Current Outpatient Prescriptions  Medication Sig Dispense Refill  . aspirin EC 325 MG EC tablet Take 1 tablet (325 mg total) by mouth daily.  30 tablet    . carvedilol (COREG) 25 MG tablet Take 1 tablet (25 mg total) by mouth 2 (two) times daily with a meal.  60 tablet  1  . cholecalciferol (VITAMIN D) 1000 UNITS tablet Take 2,000 Units by mouth daily.       . furosemide (LASIX) 40 MG tablet Take 1 tablet (40 mg total) by mouth daily.  14 tablet  0  . glimepiride (AMARYL) 1 MG tablet Take 1 tablet (1 mg total) by mouth daily with breakfast.  30 tablet  1  . metipranolol (OPTIPRANOLOL) 0.3 % ophthalmic solution Place 1 drop into both eyes 2 (two) times daily.       . Multiple Vitamin (MULTIVITAMIN) capsule Take 1 capsule by mouth daily.        Marland Kitchen omeprazole (PRILOSEC) 20 MG capsule Take 20 mg by mouth daily.        . simvastatin (ZOCOR) 10 MG tablet Take 10 mg by mouth at bedtime.        . Tamsulosin HCl (FLOMAX) 0.4 MG CAPS Take 1 capsule (0.4 mg total) by mouth daily after supper.  30 capsule  1    Physical Exam: BP 170/74  Pulse 64  Resp 16  Ht 5\' 9"  (1.753 m)  Wt 136 lb (61.689 kg)  BMI 20.08 kg/m2  SpO2 98% He looks well. Lungs:  Decrease breath sounds over the left lower lobe. Heart:  RRR, valve sounds normal Incision healing well.  Sternum stable Mild lower extremity edema, R>L.  Leg incision ok   Diagnostic Tests:  Moderate left pleural effusion that is significantly larger than at the  time of discharge.  Impression:  He is doing well overall s/p CABG/AVR.  He does have a significant left pleural effusion and I think it is worth having a thoracentesis done even though he is asymptomatic.  Plan:  I will schedule thoracentesis of the left pleural effusion by IR and I will see him back in 2 weeks for followup.

## 2012-03-06 ENCOUNTER — Other Ambulatory Visit: Payer: Self-pay | Admitting: Surgery

## 2012-03-06 DIAGNOSIS — J90 Pleural effusion, not elsewhere classified: Secondary | ICD-10-CM

## 2012-03-12 ENCOUNTER — Ambulatory Visit (HOSPITAL_COMMUNITY)
Admission: RE | Admit: 2012-03-12 | Discharge: 2012-03-12 | Disposition: A | Discharge status: Home / Self Care | Payer: Medicare Other | Source: Ambulatory Visit | Attending: Surgery | Admitting: Surgery

## 2012-03-12 ENCOUNTER — Ambulatory Visit (HOSPITAL_COMMUNITY)
Admission: RE | Admit: 2012-03-12 | Discharge: 2012-03-12 | Disposition: A | Discharge status: Home / Self Care | Payer: Medicare Other | Source: Ambulatory Visit | Attending: Radiology | Admitting: Radiology

## 2012-03-12 VITALS — BP 140/58

## 2012-03-12 DIAGNOSIS — J90 Pleural effusion, not elsewhere classified: Secondary | ICD-10-CM | POA: Diagnosis not present

## 2012-03-12 DIAGNOSIS — J9819 Other pulmonary collapse: Secondary | ICD-10-CM | POA: Diagnosis not present

## 2012-03-12 NOTE — Procedures (Signed)
Left pleural effusion US guided Left thoracentesis  Pt tolerated well 1.1 L yellow fluid  cxr pending BP stable

## 2012-03-13 ENCOUNTER — Other Ambulatory Visit: Payer: Self-pay | Admitting: Surgery

## 2012-03-13 ENCOUNTER — Telehealth (HOSPITAL_COMMUNITY): Payer: Self-pay | Admitting: *Deleted

## 2012-03-13 DIAGNOSIS — I359 Nonrheumatic aortic valve disorder, unspecified: Secondary | ICD-10-CM | POA: Diagnosis not present

## 2012-03-13 DIAGNOSIS — I2510 Atherosclerotic heart disease of native coronary artery without angina pectoris: Secondary | ICD-10-CM

## 2012-03-13 DIAGNOSIS — I214 Non-ST elevation (NSTEMI) myocardial infarction: Secondary | ICD-10-CM | POA: Diagnosis not present

## 2012-03-13 DIAGNOSIS — I509 Heart failure, unspecified: Secondary | ICD-10-CM | POA: Diagnosis not present

## 2012-03-13 DIAGNOSIS — I10 Essential (primary) hypertension: Secondary | ICD-10-CM | POA: Diagnosis not present

## 2012-03-13 DIAGNOSIS — E119 Type 2 diabetes mellitus without complications: Secondary | ICD-10-CM | POA: Diagnosis not present

## 2012-03-13 NOTE — Telephone Encounter (Signed)
Post procedure follow up call.  Spoke with pt says doing well no problems

## 2012-03-19 ENCOUNTER — Ambulatory Visit: Payer: Self-pay | Admitting: Surgery

## 2012-03-19 ENCOUNTER — Ambulatory Visit
Admission: RE | Admit: 2012-03-19 | Discharge: 2012-03-19 | Disposition: A | Discharge status: Home / Self Care | Payer: Medicare Other | Source: Ambulatory Visit | Attending: Surgery | Admitting: Surgery

## 2012-03-19 VITALS — Ht 69.0 in

## 2012-03-19 DIAGNOSIS — Z951 Presence of aortocoronary bypass graft: Secondary | ICD-10-CM | POA: Diagnosis not present

## 2012-03-19 DIAGNOSIS — J90 Pleural effusion, not elsewhere classified: Secondary | ICD-10-CM | POA: Diagnosis not present

## 2012-03-19 DIAGNOSIS — I2510 Atherosclerotic heart disease of native coronary artery without angina pectoris: Secondary | ICD-10-CM

## 2012-03-21 ENCOUNTER — Other Ambulatory Visit: Payer: Self-pay | Admitting: Surgery

## 2012-03-21 ENCOUNTER — Encounter (HOSPITAL_COMMUNITY)
Admission: RE | Admit: 2012-03-21 | Discharge: 2012-03-21 | Disposition: A | Discharge status: Home / Self Care | Payer: Medicare Other | Source: Ambulatory Visit | Attending: Cardiology | Admitting: Cardiology

## 2012-03-21 DIAGNOSIS — E119 Type 2 diabetes mellitus without complications: Secondary | ICD-10-CM | POA: Insufficient documentation

## 2012-03-21 DIAGNOSIS — E785 Hyperlipidemia, unspecified: Secondary | ICD-10-CM | POA: Insufficient documentation

## 2012-03-21 DIAGNOSIS — Z951 Presence of aortocoronary bypass graft: Secondary | ICD-10-CM | POA: Insufficient documentation

## 2012-03-21 DIAGNOSIS — N189 Chronic kidney disease, unspecified: Secondary | ICD-10-CM | POA: Insufficient documentation

## 2012-03-21 DIAGNOSIS — Z5189 Encounter for other specified aftercare: Secondary | ICD-10-CM | POA: Insufficient documentation

## 2012-03-21 DIAGNOSIS — I252 Old myocardial infarction: Secondary | ICD-10-CM | POA: Insufficient documentation

## 2012-03-21 DIAGNOSIS — I2510 Atherosclerotic heart disease of native coronary artery without angina pectoris: Secondary | ICD-10-CM

## 2012-03-21 DIAGNOSIS — Z954 Presence of other heart-valve replacement: Secondary | ICD-10-CM | POA: Insufficient documentation

## 2012-03-21 DIAGNOSIS — I129 Hypertensive chronic kidney disease with stage 1 through stage 4 chronic kidney disease, or unspecified chronic kidney disease: Secondary | ICD-10-CM | POA: Insufficient documentation

## 2012-03-21 DIAGNOSIS — I359 Nonrheumatic aortic valve disorder, unspecified: Secondary | ICD-10-CM | POA: Insufficient documentation

## 2012-03-21 NOTE — Progress Notes (Signed)
Cardiac Rehab Medication Review by a Pharmacist  Does the patient  feel that his/her medications are working for him/her?  yes  Has the patient been experiencing any side effects to the medications prescribed?  no  Does the patient measure his/her own blood pressure or blood glucose at home?  no   Does the patient have any problems obtaining medications due to transportation or finances?   no  Understanding of regimen: good Understanding of indications: fair Potential of compliance: good    Pharmacist comments: Doing well, right leg is having some trouble with stiffness, to see MD next week    [name] [name], Cristal Deer. PharmD 03/21/2012 8:18 AM

## 2012-03-25 ENCOUNTER — Encounter: Payer: Self-pay | Admitting: Surgery

## 2012-03-25 ENCOUNTER — Encounter: Payer: Self-pay | Admitting: Cardiology

## 2012-03-25 ENCOUNTER — Encounter (HOSPITAL_COMMUNITY): Payer: Medicare Other

## 2012-03-25 ENCOUNTER — Ambulatory Visit (INDEPENDENT_AMBULATORY_CARE_PROVIDER_SITE_OTHER): Payer: Self-pay | Admitting: Surgery

## 2012-03-25 VITALS — BP 150/71 | HR 62 | Resp 18 | Ht 69.0 in | Wt 148.0 lb

## 2012-03-25 DIAGNOSIS — Z9889 Other specified postprocedural states: Secondary | ICD-10-CM

## 2012-03-25 DIAGNOSIS — Z951 Presence of aortocoronary bypass graft: Secondary | ICD-10-CM

## 2012-03-25 DIAGNOSIS — Z952 Presence of prosthetic heart valve: Secondary | ICD-10-CM

## 2012-03-25 DIAGNOSIS — I2510 Atherosclerotic heart disease of native coronary artery without angina pectoris: Secondary | ICD-10-CM

## 2012-03-27 ENCOUNTER — Encounter (HOSPITAL_COMMUNITY)
Admission: RE | Admit: 2012-03-27 | Discharge: 2012-03-27 | Disposition: A | Discharge status: Home / Self Care | Payer: Medicare Other | Source: Ambulatory Visit | Attending: Cardiology | Admitting: Cardiology

## 2012-03-27 DIAGNOSIS — Z951 Presence of aortocoronary bypass graft: Secondary | ICD-10-CM | POA: Diagnosis not present

## 2012-03-27 DIAGNOSIS — I252 Old myocardial infarction: Secondary | ICD-10-CM | POA: Diagnosis not present

## 2012-03-27 DIAGNOSIS — I129 Hypertensive chronic kidney disease with stage 1 through stage 4 chronic kidney disease, or unspecified chronic kidney disease: Secondary | ICD-10-CM | POA: Diagnosis not present

## 2012-03-27 DIAGNOSIS — I509 Heart failure, unspecified: Secondary | ICD-10-CM | POA: Diagnosis not present

## 2012-03-27 DIAGNOSIS — Z5189 Encounter for other specified aftercare: Secondary | ICD-10-CM | POA: Diagnosis not present

## 2012-03-27 DIAGNOSIS — Z954 Presence of other heart-valve replacement: Secondary | ICD-10-CM | POA: Diagnosis not present

## 2012-03-27 DIAGNOSIS — N189 Chronic kidney disease, unspecified: Secondary | ICD-10-CM | POA: Diagnosis not present

## 2012-03-27 DIAGNOSIS — E119 Type 2 diabetes mellitus without complications: Secondary | ICD-10-CM | POA: Diagnosis not present

## 2012-03-27 DIAGNOSIS — E785 Hyperlipidemia, unspecified: Secondary | ICD-10-CM | POA: Diagnosis not present

## 2012-03-27 DIAGNOSIS — I359 Nonrheumatic aortic valve disorder, unspecified: Secondary | ICD-10-CM | POA: Diagnosis not present

## 2012-03-27 DIAGNOSIS — I10 Essential (primary) hypertension: Secondary | ICD-10-CM | POA: Diagnosis not present

## 2012-03-27 DIAGNOSIS — I2510 Atherosclerotic heart disease of native coronary artery without angina pectoris: Secondary | ICD-10-CM | POA: Diagnosis not present

## 2012-03-27 DIAGNOSIS — I214 Non-ST elevation (NSTEMI) myocardial infarction: Secondary | ICD-10-CM | POA: Diagnosis not present

## 2012-03-27 NOTE — Progress Notes (Signed)
[name] [name] 78 y.o. male       Nutrition Screen                                                                    YES  NO Do you live in a nursing home?  X   Do you eat out more than 3 times/week?    X If yes, how many times per week do you eat out?  Do you have food allergies?   X If yes, what are you allergic to?  Have you gained or lost more than 10 lbs without trying?              X  If yes, how much weight have you lost and over what time period?  lbs gained or lost over  weeks/month  Do you want to lose weight?     X If yes, what is a goal weight or amount of weight you would like to lose?  lb  Do you eat alone most of the time?   X   Do you eat less than 2 meals/day?  X If yes, how many meals do you eat?  Do you drink more than 3 alcohol drinks/day?  X If yes, how many drinks per day?  Are you having trouble with constipation? *  X If yes, what are you doing to help relieve constipation?  Do you have financial difficulties with buying food?*    X   Are you experiencing regular nausea/ vomiting?*     X   Do you have a poor appetite? *                                        X   Do you have trouble chewing/swallowing? *   X    Pt with diagnoses of:  X CABG              X AVR/MVR/ICD      X Dyslipidemia  / HDL< 40 / LDL>70 / High TG      X >76 years old  X %  Body fat >goal / Body Mass Index >25 X HTN / BP >120/80 X MI X Pre-diabetes XCHF        Pt Risk Score   1       Diagnosis Risk Score  90       Total Risk Score   91                        X High Risk                Low Risk    HT: 64.75" Ht Readings from Last 1 Encounters:  03/25/12 5\' 9"  (1.753 m)    WT:   148.5 lb (67.5 kg) Wt Readings from Last 3 Encounters:  03/25/12 148 lb (67.132 kg)  03/21/12 148 lb 13 oz (67.5 kg)  03/05/12 136 lb (61.689 kg)     IBW 61.1 110%IBW BMI 23.5 25.2%body fat  Meds reviewed: Glimepiride, MVI, vitamin D Past Medical History  Diagnosis Date  . HTN (hypertension)   .  History of colon cancer   . Aortic stenosis     Echocardiogram 12/13/11: EF 25-30%, posterolateral akinesis and diffuse hypokinesis, grade 2 diastolic dysfunction, moderate-severe AS, aortic valve area 1.03, mean gradient 16, mild-moderate MR, moderate LAE  . CAD (coronary artery disease)     s/p NSTEMI 12/2011:  LHC - Ostial left main 20%, ostial LAD 50%, mid 60-70%, ostial D1 40% and mid 40%, D2 70%, ostial circumflex occluded, ostial RCA 80-90%, LVEDP was 42;  plan eventually for CABG/AVR with Dr. Laneta Simmers  . Ischemic cardiomyopathy   . Chronic systolic heart failure   . CKD (chronic kidney disease)   . HLD (hyperlipidemia)   . DM2 (diabetes mellitus, type 2)   . Angina   . Heart murmur   . Cancer     colon resection  . Stroke 10/01  . GERD (gastroesophageal reflux disease)   . Diabetes mellitus     borderline  . Chronic kidney disease        Activity level: Pt is active  Wt goal: 148 lb ( 67.5 kg) Current tobacco use? No Food/Drug Interaction? No Labs:  Lipid Panel     Component Value Date/Time   CHOL 124 12/14/2011 0400   TRIG 69 12/14/2011 0400   HDL 47 12/14/2011 0400   CHOLHDL 2.6 12/14/2011 0400   VLDL 14 12/14/2011 0400   LDLCALC 63 12/14/2011 0400   Lab Results  Component Value Date   HGBA1C 6.0* 02/06/2012   02/22/12 Glucose 108  LDL goal:< 70      MI, DM and > 2:      HTN, family h/o, > 76 yo male Estimated Daily Nutrition Needs for: ? wt maintenance  1950-2200 Kcal , Total Fat 60-70gm, Saturated Fat 14-17 gm, Trans Fat 2.1-2.4 gm,  Sodium less than 1500 mg , Grams of CHO 250

## 2012-03-27 NOTE — Progress Notes (Addendum)
Pt started cardiac rehab today.  Pt tolerated light exercise without difficulty. Telemetry Sinus with St Depression and T wave inversion not present on previous ECG tracings.  Blood pressure 120-150 systolic over 60 to 70 diastolic. Faxed ECG tracings with vital signs to Dr Anola Gurney office for review. Dr Johney Frame notified and reviewed ECG tracings said [name] [name] is okay to go home. Will continue to monitor the patient throughout  the program. [name] [name] left without complaints.

## 2012-03-29 ENCOUNTER — Encounter (HOSPITAL_COMMUNITY)
Admission: RE | Admit: 2012-03-29 | Discharge: 2012-03-29 | Disposition: A | Discharge status: Home / Self Care | Payer: Medicare Other | Source: Ambulatory Visit | Attending: Cardiology | Admitting: Cardiology

## 2012-03-29 LAB — GLUCOSE, CAPILLARY
Glucose-Capillary: 85 mg/dL (ref 70–99)
Glucose-Capillary: 85 mg/dL (ref 70–99)

## 2012-03-29 NOTE — Progress Notes (Signed)
CBG 85.  Patient asymptomatic.  [name] [name] was given lemonade and graham crackers.  Repeat blood sugar 85 15 minutes later.  Dr Ernest Mallick office called to notify of blood sugars.  Will fax [name] [name]'s exercise flow sheet to Dr Ernest Mallick office for review.

## 2012-04-01 ENCOUNTER — Encounter (HOSPITAL_COMMUNITY)
Admission: RE | Admit: 2012-04-01 | Discharge: 2012-04-01 | Disposition: A | Discharge status: Home / Self Care | Payer: Medicare Other | Source: Ambulatory Visit | Attending: Cardiology | Admitting: Cardiology

## 2012-04-01 ENCOUNTER — Encounter: Payer: Self-pay | Admitting: Surgery

## 2012-04-01 NOTE — Progress Notes (Signed)
Nutrition Note Spoke with pt. Pt holding Amaryl due to low pre-exercise CBG's of 106 and 85 mg/dL. Post-exercise CBG's decreasing significantly despite pre-exercise snack given. Pre-exercise CBG's should be > 110 mg/dL due to risk for hypoglycemia with Amaryl. Pt last A1c 6.0, which indicates very tight glucose control. Pt ate a broiled chicken sandwiches, carrots, greens, pineapple, and a glass of milk at 12:15-13:30 pm. Pt reports today's lunch is typical for him. Pre-exercise CBG while holding Amaryl today was 219 mg/dL and post-exercise CBG was 122 mg/dL. Will continue to monitor pre- and post-exercise CBG's while pt continues to hold Amaryl. Will notify pt's PCP of pt's trial of holding Amaryl and monitoring CBG's. Continue client-centered nutrition education by RD as part of interdisciplinary care.  Monitor and evaluate progress toward nutrition goal with team.

## 2012-04-01 NOTE — Progress Notes (Signed)
HPI:  Patient returns for routine postoperative follow-up having undergone CABG and AVR with a tissue valve on 02/07/12. I last saw him on 03/05/2012 for his initial postoperative visit and a chest x-ray at that time showed a significant left pleural effusion which did increase from his last chest x-ray while in the hospital. He felt well and was asymptomatic but given the size of the effusion I thought it was best to proceed with a thoracentesis. He had left thoracentesis performed on 03/12/2012 by IR withdrawing 1.1 L of yellow serous fluid. He says that since then he has continued to feel well it is walking daily without chest pain or shortness of breath.   Current Outpatient Prescriptions  Medication Sig Dispense Refill  . aspirin EC 325 MG EC tablet Take 1 tablet (325 mg total) by mouth daily.  30 tablet    . carvedilol (COREG) 25 MG tablet Take 1 tablet (25 mg total) by mouth 2 (two) times daily with a meal.  60 tablet  1  . cholecalciferol (VITAMIN D) 1000 UNITS tablet Take 2,000 Units by mouth daily.       . furosemide (LASIX) 40 MG tablet Take 1 tablet (40 mg total) by mouth daily.  14 tablet  0  . glimepiride (AMARYL) 1 MG tablet Take 1 tablet (1 mg total) by mouth daily with breakfast.  30 tablet  1  . metipranolol (OPTIPRANOLOL) 0.3 % ophthalmic solution Place 1 drop into both eyes 2 (two) times daily.       . Multiple Vitamin (MULTIVITAMIN) capsule Take 1 capsule by mouth daily.        Marland Kitchen omeprazole (PRILOSEC) 20 MG capsule Take 20 mg by mouth daily.        . simvastatin (ZOCOR) 10 MG tablet Take 10 mg by mouth at bedtime.        . Tamsulosin HCl (FLOMAX) 0.4 MG CAPS Take 1 capsule (0.4 mg total) by mouth daily after supper.  30 capsule  1  . DISCONTD: metFORMIN (GLUCOPHAGE) 500 MG tablet Take 500-1,000 mg by mouth. 2 tabs in the am, 1 tab in the pm      . DISCONTD: potassium chloride SA (K-DUR,KLOR-CON) 20 MEQ tablet Take 1 tablet (20 mEq total) by mouth daily.  14 tablet  3      Physical Exam: BP 150/71  Pulse 62  Resp 18  Ht 5\' 9"  (1.753 m)  Wt 148 lb (67.132 kg)  BMI 21.86 kg/m2  SpO2 98% He looks well. Lung exam reveals decreased breath sounds at left base. Cardiac exam shows a regular rate and rhythm with normal bowel sounds. There is no peripheral edema.  Diagnostic Tests:  *RADIOLOGY REPORT*   Clinical Data: Status post CABG, follow-up   CHEST - 2 VIEW   Comparison: Chest x-ray of 03/12/2012   Findings: There has been a slight increase in volume of the left pleural effusion and there may be a tiny right effusion present. Cardiomegaly is stable.  Median sternotomy sutures are noted from prior CABG as well as aortic valve replacement.   IMPRESSION: Slight increase in small left pleural effusion.   Original Report Authenticated By: Juline Patch, M.D.   Impression:  He continues to do well following surgery. Chest x-ray today shows a slight increase in the left pleural effusion compared to his chest x-ray immediately after thoracentesis. We will continue to follow this for now.  Plan:  I will plan to see him back in about one month with  a repeat chest x-ray. I told him if he develops any shortness of breath he should call his immediately so that we can see him and get a repeat chest x-ray at that time.

## 2012-04-02 LAB — GLUCOSE, CAPILLARY: Glucose-Capillary: 219 mg/dL — ABNORMAL HIGH (ref 70–99)

## 2012-04-03 ENCOUNTER — Other Ambulatory Visit: Payer: Self-pay | Admitting: Cardiology

## 2012-04-03 ENCOUNTER — Encounter (HOSPITAL_COMMUNITY)
Admission: RE | Admit: 2012-04-03 | Discharge: 2012-04-03 | Disposition: A | Discharge status: Home / Self Care | Payer: Medicare Other | Source: Ambulatory Visit | Attending: Cardiology | Admitting: Cardiology

## 2012-04-03 DIAGNOSIS — I6529 Occlusion and stenosis of unspecified carotid artery: Secondary | ICD-10-CM

## 2012-04-03 NOTE — Progress Notes (Signed)
Fax received from [employer], DO. Glimepiride discontinued and Hemoglobin A1c to be rechecked at next office visit. Pt informed re: DO order. Pt expressed understanding. Continue client-centered nutrition education by RD as part of interdisciplinary care.  Monitor and evaluate progress toward nutrition goal with team.

## 2012-04-04 NOTE — Progress Notes (Signed)
[name] [name] is not taking his glimepiride. Blood sugars have been stable. Will continue to monitor the patient.

## 2012-04-05 ENCOUNTER — Encounter (HOSPITAL_COMMUNITY)
Admission: RE | Admit: 2012-04-05 | Discharge: 2012-04-05 | Disposition: A | Discharge status: Home / Self Care | Payer: Medicare Other | Source: Ambulatory Visit | Attending: Cardiology | Admitting: Cardiology

## 2012-04-08 ENCOUNTER — Encounter (HOSPITAL_COMMUNITY): Payer: Medicare Other

## 2012-04-08 ENCOUNTER — Inpatient Hospital Stay (HOSPITAL_COMMUNITY)
Admission: AD | Admit: 2012-04-08 | Discharge: 2012-04-15 | DRG: 863 | Disposition: A | Discharge status: Home / Self Care | Payer: Medicare Other | Source: Ambulatory Visit | Attending: Surgery | Admitting: Surgery

## 2012-04-08 ENCOUNTER — Institutional Professional Consult (permissible substitution) (INDEPENDENT_AMBULATORY_CARE_PROVIDER_SITE_OTHER): Payer: Medicare Other | Admitting: Physician Assistant

## 2012-04-08 ENCOUNTER — Other Ambulatory Visit: Payer: Self-pay | Admitting: Thoracic Surgery (Cardiothoracic Vascular Surgery)

## 2012-04-08 VITALS — BP 140/70 | Temp 97.8°F | Resp 16 | Ht 69.0 in | Wt 140.4 lb

## 2012-04-08 DIAGNOSIS — Z7982 Long term (current) use of aspirin: Secondary | ICD-10-CM | POA: Diagnosis not present

## 2012-04-08 DIAGNOSIS — Z9889 Other specified postprocedural states: Secondary | ICD-10-CM | POA: Diagnosis not present

## 2012-04-08 DIAGNOSIS — Z951 Presence of aortocoronary bypass graft: Secondary | ICD-10-CM

## 2012-04-08 DIAGNOSIS — E785 Hyperlipidemia, unspecified: Secondary | ICD-10-CM | POA: Diagnosis present

## 2012-04-08 DIAGNOSIS — N179 Acute kidney failure, unspecified: Secondary | ICD-10-CM | POA: Diagnosis present

## 2012-04-08 DIAGNOSIS — I2589 Other forms of chronic ischemic heart disease: Secondary | ICD-10-CM | POA: Diagnosis present

## 2012-04-08 DIAGNOSIS — B9689 Other specified bacterial agents as the cause of diseases classified elsewhere: Secondary | ICD-10-CM | POA: Diagnosis present

## 2012-04-08 DIAGNOSIS — T8140XA Infection following a procedure, unspecified, initial encounter: Secondary | ICD-10-CM | POA: Diagnosis not present

## 2012-04-08 DIAGNOSIS — L0291 Cutaneous abscess, unspecified: Secondary | ICD-10-CM

## 2012-04-08 DIAGNOSIS — I359 Nonrheumatic aortic valve disorder, unspecified: Secondary | ICD-10-CM

## 2012-04-08 DIAGNOSIS — I2510 Atherosclerotic heart disease of native coronary artery without angina pectoris: Secondary | ICD-10-CM

## 2012-04-08 DIAGNOSIS — I5022 Chronic systolic (congestive) heart failure: Secondary | ICD-10-CM | POA: Diagnosis present

## 2012-04-08 DIAGNOSIS — L03119 Cellulitis of unspecified part of limb: Secondary | ICD-10-CM | POA: Diagnosis present

## 2012-04-08 DIAGNOSIS — I129 Hypertensive chronic kidney disease with stage 1 through stage 4 chronic kidney disease, or unspecified chronic kidney disease: Secondary | ICD-10-CM | POA: Diagnosis present

## 2012-04-08 DIAGNOSIS — J90 Pleural effusion, not elsewhere classified: Secondary | ICD-10-CM | POA: Diagnosis not present

## 2012-04-08 DIAGNOSIS — Z79899 Other long term (current) drug therapy: Secondary | ICD-10-CM

## 2012-04-08 DIAGNOSIS — N189 Chronic kidney disease, unspecified: Secondary | ICD-10-CM | POA: Diagnosis present

## 2012-04-08 DIAGNOSIS — E876 Hypokalemia: Secondary | ICD-10-CM | POA: Diagnosis present

## 2012-04-08 DIAGNOSIS — L02419 Cutaneous abscess of limb, unspecified: Secondary | ICD-10-CM | POA: Diagnosis present

## 2012-04-08 DIAGNOSIS — Y92009 Unspecified place in unspecified non-institutional (private) residence as the place of occurrence of the external cause: Secondary | ICD-10-CM

## 2012-04-08 DIAGNOSIS — E119 Type 2 diabetes mellitus without complications: Secondary | ICD-10-CM | POA: Diagnosis present

## 2012-04-08 DIAGNOSIS — L0390 Cellulitis, unspecified: Secondary | ICD-10-CM

## 2012-04-08 DIAGNOSIS — M7989 Other specified soft tissue disorders: Secondary | ICD-10-CM | POA: Diagnosis not present

## 2012-04-08 DIAGNOSIS — Y838 Other surgical procedures as the cause of abnormal reaction of the patient, or of later complication, without mention of misadventure at the time of the procedure: Secondary | ICD-10-CM | POA: Diagnosis present

## 2012-04-08 HISTORY — DX: Pleural effusion, not elsewhere classified: J90

## 2012-04-08 HISTORY — DX: Encounter for other specified aftercare: Z51.89

## 2012-04-08 HISTORY — DX: Reserved for inherently not codable concepts without codable children: IMO0001

## 2012-04-08 HISTORY — DX: Heart failure, unspecified: I50.9

## 2012-04-08 HISTORY — DX: Acute myocardial infarction, unspecified: I21.9

## 2012-04-08 LAB — CBC
MCHC: 33.1 g/dL (ref 30.0–36.0)
Platelets: 144 10*3/uL — ABNORMAL LOW (ref 150–400)
RDW: 12.6 % (ref 11.5–15.5)

## 2012-04-08 LAB — GLUCOSE, CAPILLARY
Glucose-Capillary: 138 mg/dL — ABNORMAL HIGH (ref 70–99)
Glucose-Capillary: 127 mg/dL — ABNORMAL HIGH (ref 70–99)

## 2012-04-08 LAB — BASIC METABOLIC PANEL
GFR calc Af Amer: 36 mL/min — ABNORMAL LOW (ref >90)
GFR calc non Af Amer: 31 mL/min — ABNORMAL LOW (ref >90)
Potassium: 4.2 mEq/L (ref 3.5–5.1)
Sodium: 138 mEq/L (ref 135–145)

## 2012-04-08 MED ORDER — VITAMIN D3 25 MCG (1000 UNIT) PO TABS
2000.0000 [IU] | ORAL_TABLET | Freq: Every day | ORAL | Status: DC
  Administered 2012-04-09 – 2012-04-15 (×7): 2000 [IU] via ORAL
  Filled 2012-04-08 (×7): qty 2

## 2012-04-08 MED ORDER — PANTOPRAZOLE SODIUM 40 MG PO TBEC
40.0000 mg | DELAYED_RELEASE_TABLET | Freq: Every day | ORAL | Status: DC
  Administered 2012-04-08 – 2012-04-14 (×7): 40 mg via ORAL
  Filled 2012-04-08 (×7): qty 1

## 2012-04-08 MED ORDER — ASPIRIN 81 MG PO TBEC: 325.0000 mg | DELAYED_RELEASE_TABLET | Freq: Every day | ORAL | Status: DC

## 2012-04-08 MED ORDER — INSULIN ASPART 100 UNIT/ML ~~LOC~~ SOLN
0.0000 [IU] | Freq: Three times a day (TID) | SUBCUTANEOUS | Status: DC
  Administered 2012-04-08 – 2012-04-09 (×4): 1 [IU] via SUBCUTANEOUS
  Administered 2012-04-10: 2 [IU] via SUBCUTANEOUS
  Administered 2012-04-10 (×2): 1 [IU] via SUBCUTANEOUS
  Administered 2012-04-11: 2 [IU] via SUBCUTANEOUS
  Administered 2012-04-11 – 2012-04-12 (×2): 1 [IU] via SUBCUTANEOUS
  Administered 2012-04-13: 3 [IU] via SUBCUTANEOUS
  Administered 2012-04-13: 1 [IU] via SUBCUTANEOUS
  Administered 2012-04-14: 3 [IU] via SUBCUTANEOUS
  Administered 2012-04-14: 1 [IU] via SUBCUTANEOUS

## 2012-04-08 MED ORDER — ASPIRIN EC 325 MG PO TBEC
325.0000 mg | DELAYED_RELEASE_TABLET | Freq: Every day | ORAL | Status: DC
  Administered 2012-04-08 – 2012-04-15 (×8): 325 mg via ORAL
  Filled 2012-04-08 (×8): qty 1

## 2012-04-08 MED ORDER — ENOXAPARIN SODIUM 40 MG/0.4ML ~~LOC~~ SOLN
40.0000 mg | Freq: Two times a day (BID) | SUBCUTANEOUS | Status: DC
  Administered 2012-04-08 – 2012-04-09 (×2): 40 mg via SUBCUTANEOUS
  Filled 2012-04-08 (×4): qty 0.4

## 2012-04-08 MED ORDER — INSULIN ASPART 100 UNIT/ML ~~LOC~~ SOLN: 0.0000 [IU] | Freq: Every day | SUBCUTANEOUS | Status: DC

## 2012-04-08 MED ORDER — TAMSULOSIN HCL 0.4 MG PO CAPS
0.4000 mg | ORAL_CAPSULE | Freq: Every day | ORAL | Status: DC
  Administered 2012-04-08 – 2012-04-14 (×7): 0.4 mg via ORAL
  Filled 2012-04-08 (×8): qty 1

## 2012-04-08 MED ORDER — ACETAMINOPHEN 325 MG PO TABS
650.0000 mg | ORAL_TABLET | ORAL | Status: DC | PRN
  Administered 2012-04-08 (×2): 650 mg via ORAL
  Filled 2012-04-08 (×2): qty 2

## 2012-04-08 MED ORDER — SIMVASTATIN 20 MG PO TABS
20.0000 mg | ORAL_TABLET | Freq: Every day | ORAL | Status: DC
  Administered 2012-04-08 – 2012-04-14 (×7): 20 mg via ORAL
  Filled 2012-04-08 (×8): qty 1

## 2012-04-08 MED ORDER — FUROSEMIDE 40 MG PO TABS
40.0000 mg | ORAL_TABLET | Freq: Every day | ORAL | Status: DC
  Administered 2012-04-09 – 2012-04-15 (×7): 40 mg via ORAL
  Filled 2012-04-08 (×7): qty 1

## 2012-04-08 MED ORDER — METIPRANOLOL 0.3 % OP SOLN
1.0000 [drp] | Freq: Two times a day (BID) | OPHTHALMIC | Status: DC
  Filled 2012-04-08 (×2): qty 5

## 2012-04-08 MED ORDER — TRAMADOL HCL 50 MG PO TABS: 50.0000 mg | ORAL_TABLET | Freq: Four times a day (QID) | ORAL | Status: DC | PRN

## 2012-04-08 MED ORDER — VANCOMYCIN HCL 1000 MG IV SOLR
750.0000 mg | INTRAVENOUS | Status: DC
  Administered 2012-04-08 – 2012-04-09 (×2): 750 mg via INTRAVENOUS
  Filled 2012-04-08 (×3): qty 750

## 2012-04-08 MED ORDER — CARVEDILOL 25 MG PO TABS
25.0000 mg | ORAL_TABLET | Freq: Two times a day (BID) | ORAL | Status: DC
  Administered 2012-04-08 – 2012-04-15 (×14): 25 mg via ORAL
  Filled 2012-04-08 (×16): qty 1

## 2012-04-08 NOTE — Progress Notes (Signed)
ANTIBIOTIC CONSULT NOTE - INITIAL  Pharmacy Consult for Vancomycin Indication: cellulitis  No Known Allergies  Patient Measurements: Height: 5\' 9"  (175.3 cm) Weight: 141 lb 8.6 oz (64.2 kg) IBW/kg (Calculated) : 70.7  Adjusted Body Weight:  Vital Signs: Temp: 99.2 F (37.3 C) (04/29 1420) Temp src: Oral (04/29 1420) BP: 163/74 mmHg (04/29 1420) Pulse Rate: 67  (04/29 1420) Intake/Output from previous day:   Intake/Output from this shift:    Labs: No results found for this basename: WBC:3,HGB:3,PLT:3,LABCREA:3,CREATININE:3 in the last 72 hours Estimated Creatinine Clearance: 23.8 ml/min (by C-G formula based on Cr of 2.1). No results found for this basename: VANCOTROUGH:2,VANCOPEAK:2,VANCORANDOM:2,GENTTROUGH:2,GENTPEAK:2,GENTRANDOM:2,TOBRATROUGH:2,TOBRAPEAK:2,TOBRARND:2,AMIKACINPEAK:2,AMIKACINTROU:2,AMIKACIN:2, in the last 72 hours   Microbiology: No results found for this or any previous visit (from the past 720 hour(s)).  Medical History: Past Medical History  Diagnosis Date  . HTN (hypertension)   . History of colon cancer   . Aortic stenosis     Echocardiogram 12/13/11: EF 25-30%, posterolateral akinesis and diffuse hypokinesis, grade 2 diastolic dysfunction, moderate-severe AS, aortic valve area 1.03, mean gradient 16, mild-moderate MR, moderate LAE  . CAD (coronary artery disease)     s/p NSTEMI 12/2011:  LHC - Ostial left main 20%, ostial LAD 50%, mid 60-70%, ostial D1 40% and mid 40%, D2 70%, ostial circumflex occluded, ostial RCA 80-90%, LVEDP was 42;  plan eventually for CABG/AVR with Dr. Laneta Simmers  . Ischemic cardiomyopathy   . Chronic systolic heart failure   . CKD (chronic kidney disease)   . HLD (hyperlipidemia)   . DM2 (diabetes mellitus, type 2)   . Angina   . Heart murmur   . Cancer     colon resection  . Stroke 10/01  . GERD (gastroesophageal reflux disease)   . Diabetes mellitus     borderline  . Chronic kidney disease     Medications:    Prescriptions prior to admission  Medication Sig Dispense Refill  . aspirin EC 325 MG EC tablet Take 1 tablet (325 mg total) by mouth daily.  30 tablet    . carvedilol (COREG) 25 MG tablet Take 1 tablet (25 mg total) by mouth 2 (two) times daily with a meal.  60 tablet  1  . cholecalciferol (VITAMIN D) 1000 UNITS tablet Take 2,000 Units by mouth daily.       . furosemide (LASIX) 40 MG tablet Take 1 tablet (40 mg total) by mouth daily.  14 tablet  0  . metipranolol (OPTIPRANOLOL) 0.3 % ophthalmic solution Place 1 drop into both eyes 2 (two) times daily.       . Multiple Vitamin (MULTIVITAMIN) capsule Take 1 capsule by mouth daily.        Marland Kitchen omeprazole (PRILOSEC) 20 MG capsule Take 20 mg by mouth daily.        . simvastatin (ZOCOR) 10 MG tablet Take 10 mg by mouth at bedtime.        . Tamsulosin HCl (FLOMAX) 0.4 MG CAPS Take 1 capsule (0.4 mg total) by mouth daily after supper.  30 capsule  1   Assessment:[name] [name] is an 78 yo male S/P AVR/CABG performed by Dr. Laneta Simmers in 02/07/2012. At his postoperative visit he was found to have a large sided left pleural effusion, for which he underwent IR guided Thoracentesis with approximately 1L of serosaguinous fluid evacuated. He last followed up with Dr. Laneta Simmers on 03/25/2012 at which time he was doing very well. He contacted our office concerned about his right leg. He stated he had undergone  vein harvest, but his leg is very red and painful to touch.  Starting vancomycin for cellulitis, CrCl =approximately 30 ml/min  Goal of Therapy:  Vancomycin trough level 10-15 mcg/ml  Plan:  1) Vancomycin 750 mg IV Q 24 hours 2) Follow Scr, plan, cultures.  Elwin Sleight 04/08/2012,2:25 PM

## 2012-04-08 NOTE — Progress Notes (Signed)
  HPI:  [name] [name] is an 78 yo male S/P AVR/CABG performed by Dr. Laneta Simmers in 02/07/2012.  At his postoperative visit he was found to have a large sided left pleural effusion, for which he underwent IR guided Thoracentesis with approximately 1L of serosaguinous fluid evacuated.  He last followed up with Dr. Laneta Simmers on 03/25/2012 at which time he was doing very well.  He contacted our office concerned about his right leg.  He stated he had undergone vein harvest, but his leg is very red and painful to touch.  The patient was instructed to come to clinic this afternoon to be evaluated.  The patient states overall he has been doing very well.  He does state that he had some swellling in his RLE where the vein was taken out, however overnight the leg became more swollen, red, and very painful to touch.  He states he was not able to get up out of bed and walk on the leg this morning.  He denies fever, but states that they did not take his temperature.  He does complain of chills.  The patient states he is a borderline diabetic, and as of late his blood sugars have been running very low.   Current Outpatient Prescriptions  Medication Sig Dispense Refill  . aspirin EC 325 MG EC tablet Take 1 tablet (325 mg total) by mouth daily.  30 tablet    . carvedilol (COREG) 25 MG tablet Take 1 tablet (25 mg total) by mouth 2 (two) times daily with a meal.  60 tablet  1  . cholecalciferol (VITAMIN D) 1000 UNITS tablet Take 2,000 Units by mouth daily.       . furosemide (LASIX) 40 MG tablet Take 1 tablet (40 mg total) by mouth daily.  14 tablet  0  . metipranolol (OPTIPRANOLOL) 0.3 % ophthalmic solution Place 1 drop into both eyes 2 (two) times daily.       . Multiple Vitamin (MULTIVITAMIN) capsule Take 1 capsule by mouth daily.        Marland Kitchen omeprazole (PRILOSEC) 20 MG capsule Take 20 mg by mouth daily.        . simvastatin (ZOCOR) 10 MG tablet Take 10 mg by mouth at bedtime.        . Tamsulosin HCl (FLOMAX) 0.4 MG CAPS Take 1  capsule (0.4 mg total) by mouth daily after supper.  30 capsule  1  . DISCONTD: metFORMIN (GLUCOPHAGE) 500 MG tablet Take 500-1,000 mg by mouth. 2 tabs in the am, 1 tab in the pm      . DISCONTD: potassium chloride SA (K-DUR,KLOR-CON) 20 MEQ tablet Take 1 tablet (20 mEq total) by mouth daily.  14 tablet  3   Physical Exam:  Gen: no apparent distress Lungs: CTA bilaterally Heart: RRR Abd: soft non-tender, non-distended Extremities: RLE very swollen along vein harvesting tunnel from mid calf to right above knee, erythematous and soft to touch; Neuro: grossly intact  Impression:  [name] [name] is an 78 yo male S/P AVR/CABG from 02/07/2012 who presents with questionable RLE cellulitis.    Procedure: I/D of RLE possible abscess, 2cm incision made in RLE calf region, approximately 200cc of serosanguinous drainage expressed, +Odor, wound culture obtained, wound cleaned and packed with iodophorm gauze  Plan:  1. RLE Cellulitis-  Will admit to inpatient 2. Check CBC, BMET, Blood Cultures

## 2012-04-08 NOTE — H&P (Signed)
CARDIOTHORACIC SURGERY HISTORY AND PHYSICAL EXAM  PCP is [name],[name] L., DO, DO Attending physician is [name]., MD   HPI:  [name] [name] is an 78 yo male S/P AVR/CABG performed by Dr. Laneta Simmers in 02/07/2012.  At his postoperative visit he was found to have a large sided left pleural effusion, for which he underwent IR guided Thoracentesis with approximately 1L of serosaguinous fluid evacuated.  He last followed up with Dr. Laneta Simmers on 03/25/2012 at which time he was doing very well.  He contacted our office concerned about his right leg.  He stated he had undergone vein harvest, but his leg is very red and painful to touch.  The patient was instructed to come to clinic this afternoon to be evaluated.  The patient states overall he has been doing very well.  He does state that he had some swellling in his RLE where the vein was taken out, however overnight the leg became more swollen, red, and very painful to touch.  He states he was not able to get up out of bed and walk on the leg this morning.  He denies fever, but states that they did not take his temperature.  He does complain of chills.  The patient states he is a borderline diabetic, and as of late his blood sugars have been running very low.     Current Outpatient Prescriptions   Medication  Sig  Dispense  Refill   .  aspirin EC 325 MG EC tablet  Take 1 tablet (325 mg total) by mouth daily.   30 tablet      .  carvedilol (COREG) 25 MG tablet  Take 1 tablet (25 mg total) by mouth 2 (two) times daily with a meal.   60 tablet   1   .  cholecalciferol (VITAMIN D) 1000 UNITS tablet  Take 2,000 Units by mouth daily.          .  furosemide (LASIX) 40 MG tablet  Take 1 tablet (40 mg total) by mouth daily.   14 tablet   0   .  metipranolol (OPTIPRANOLOL) 0.3 % ophthalmic solution  Place 1 drop into both eyes 2 (two) times daily.          .  Multiple Vitamin (MULTIVITAMIN) capsule  Take 1 capsule by mouth daily.           Marland Kitchen  omeprazole  (PRILOSEC) 20 MG capsule  Take 20 mg by mouth daily.           .  simvastatin (ZOCOR) 10 MG tablet  Take 10 mg by mouth at bedtime.           .  Tamsulosin HCl (FLOMAX) 0.4 MG CAPS  Take 1 capsule (0.4 mg total) by mouth daily after supper.   30 capsule   1   .  DISCONTD: metFORMIN (GLUCOPHAGE) 500 MG tablet  Take 500-1,000 mg by mouth. 2 tabs in the am, 1 tab in the pm         .  DISCONTD: potassium chloride SA (K-DUR,KLOR-CON) 20 MEQ tablet  Take 1 tablet (20 mEq total) by mouth daily.   14 tablet   3    Physical Exam:  Gen: no apparent distress Lungs: CTA bilaterally Heart: RRR Abd: soft non-tender, non-distended Extremities: RLE very swollen along vein harvesting tunnel from mid calf to right above knee, erythematous and soft to touch; Neuro: grossly intact  Impression:  [name] is an 78 yo male S/P AVR/CABG from 02/07/2012 who presents with questionable RLE cellulitis.    Procedure: I/D of RLE possible abscess, 2cm incision made in RLE calf region, approximately 200cc of serosanguinous drainage expressed, +Odor, wound culture obtained, wound cleaned and packed with iodophorm gauze  Plan:  1. RLE Cellulitis-  Will admit to inpatient  BARRETT, ERIN R    I have seen and examined the patient and agree with the assessment and plan as outlined by [name].  [name] [name] 04/08/2012 6:54 PM

## 2012-04-09 ENCOUNTER — Encounter (HOSPITAL_COMMUNITY): Payer: Self-pay | Admitting: General Practice

## 2012-04-09 LAB — GLUCOSE, CAPILLARY
Glucose-Capillary: 138 mg/dL — ABNORMAL HIGH (ref 70–99)
Glucose-Capillary: 141 mg/dL — ABNORMAL HIGH (ref 70–99)

## 2012-04-09 MED ORDER — METIPRANOLOL 0.3 % OP SOLN
1.0000 [drp] | Freq: Two times a day (BID) | OPHTHALMIC | Status: DC
  Administered 2012-04-10 – 2012-04-15 (×11): 1 [drp] via OPHTHALMIC
  Filled 2012-04-09: qty 5

## 2012-04-09 MED ORDER — ENOXAPARIN SODIUM 30 MG/0.3ML ~~LOC~~ SOLN
30.0000 mg | SUBCUTANEOUS | Status: DC
  Administered 2012-04-10 – 2012-04-15 (×6): 30 mg via SUBCUTANEOUS
  Filled 2012-04-09 (×7): qty 0.3

## 2012-04-09 MED ORDER — CIPROFLOXACIN HCL 500 MG PO TABS
500.0000 mg | ORAL_TABLET | Freq: Every day | ORAL | Status: DC
  Administered 2012-04-09 – 2012-04-10 (×2): 500 mg via ORAL
  Filled 2012-04-09 (×3): qty 1

## 2012-04-09 MED ORDER — OXYCODONE HCL 5 MG PO TABS: 5.0000 mg | ORAL_TABLET | ORAL | Status: DC | PRN

## 2012-04-09 NOTE — Progress Notes (Signed)
Agree with outpatient discharge summary written by Trevor Mace exercise specialist.

## 2012-04-09 NOTE — Progress Notes (Addendum)
  Subjective:  Mr. Osment complains of right leg pain  Objective: Vital signs in last 24 hours: Temp:  [97.8 F (36.6 C)-101.6 F (38.7 C)] 99.2 F (37.3 C) (04/30 0613) Pulse Rate:  [67-71] 71  (04/30 1478) Cardiac Rhythm:  [-] Normal sinus rhythm (04/29 2015) Resp:  [16-18] 18  (04/30 0613) BP: (118-163)/(59-74) 134/59 mmHg (04/30 0613) SpO2:  [90 %-94 %] 92 % (04/30 0613) Weight:  [140 lb 6.4 oz (63.685 kg)-141 lb 8.6 oz (64.2 kg)] 141 lb 8.6 oz (64.2 kg) (04/29 1420)  Intake/Output from previous day: 04/29 0701 - 04/30 0700 In: 600 [P.O.:600] Out: 575 [Urine:575]  General appearance: alert, cooperative and no distress Heart: regular rate and rhythm Lungs: clear to auscultation bilaterally Abdomen: soft, non-tender; bowel sounds normal; no masses,  no organomegaly Extremities: edema trace Wound: RLE wound I/D yesterday, still draining serosanguinous fluid, +Odor  Lab Results:  [facility] 04/08/12 1617  WBC 8.2  HGB 11.0*  HCT 33.2*  PLT 144*   BMET:  Basename 04/08/12 1617  NA 138  K 4.2  CL 100  CO2 29  GLUCOSE 123*  BUN 32*  CREATININE 1.90*  CALCIUM 9.4    PT/INR: No results found for this basename: LABPROT,INR in the last 72 hours ABG    Component Value Date/Time   PHART 7.360 02/07/2012 2227   HCO3 24.7* 02/07/2012 2227   TCO2 25 02/08/2012 1730   ACIDBASEDEF 1.0 02/07/2012 2227   O2SAT 99.0 02/07/2012 2227   CBG (last 3)   Basename 04/09/12 0615 04/08/12 2110 04/08/12 1640  GLUCAP 121* 127* 138*    Assessment/Plan:  1. RLE Cellulitis- S/P I&D, culture pending, will continue Vancomycin, add Cipro PO for further coverage 2. R/O Bacteremia- patients WBC WNL, remains Febrile, blood cultures pending 3. Renal Insufficiency- patients creatinine is 1.91, which is running around his baseline, will discontinue ultram 4. Pain control- will start Oxy IR Q4 prn pain 6. DM- CBGs being closely monitored, will adjust medications as needed 7. Wound  care   LOS: 1 day    [name] 04/09/2012

## 2012-04-09 NOTE — Progress Notes (Signed)
Cardiac Rehabilitation Program Outcomes Report   Orientation:  03/21/2012 Discharge Date:  04/09/2012  # of sessions completed: 4/36  Cardiologist: Daleen Squibb Family MD:  [place] Time:  1445  A.  Exercise Program:  Tolerates exercise @ 3.2 METS for 30 minutes, Bike Test Results:  Pre: 0.73 mile and Discharged  B.  Mental Health:  Good mental attitude and Quality of Life (QOL) Pre Scores Only:  Overall  28.81, Health/Functioning 28.80, Socioeconomics 30, Psych/Spiritual 30, Family 25.50    C.  Education/Instruction/Skills  Uses Perceived Exertion Scale and/or Dyspnea Scale and Attended 4/13 education classes    D.  Nutrition/Weight Control/Body Composition:     E.  Blood Lipids    Lab Results  Component Value Date   CHOL 124 12/14/2011   HDL 47 12/14/2011   LDLCALC 63 12/14/2011   TRIG 69 12/14/2011   CHOLHDL 2.6 12/14/2011    F.  Lifestyle Changes:    G.  Symptoms noted with exercise:  Hypoglycemic (4/19)  and Resting hypertension post exercise (4/26)  Report Completed By:  Fabio Pierce, MA, ACSM RCEP   Comments:  Pt did well while in program and achieved 3.2 METs.  Pt was dropped from program after a readmission for cellulitis in his leg (vein graft site).  Since pt will need to be off his feet for a while, we felt it was best to drop him at this time; however, once he is cleared to return we will gladly bring the pt back in to resume rehab at that time (starting on session 5).  As of d/c pt was in sinus rhythm with ST depression.  Thanks for the referral and we look forward to working with him again in the future. Fabio Pierce, MA, ACSM RCEP

## 2012-04-09 NOTE — Progress Notes (Signed)
UR Completed.  Dajha Urquilla Jane [phone] 04/09/2012  

## 2012-04-09 NOTE — Progress Notes (Signed)
   CARE MANAGEMENT NOTE 04/09/2012  Patient:  Hickam,Rino   Account Number:  [id number]  Date Initiated:  04/09/2012  Documentation initiated by:  Avie Arenas  Subjective/Objective Assessment:   Cellulitis - post CABG in Feb.  has spouse had HHRN with [facility] on last dc'.     Action/Plan:   PTA, PT INDEPENDENT, LIVES WITH SPOUSE.  WIFE TO PROVIDE CARE AT DISCHARGE.  CURRENTLY NOT ACTIVE WITH HH AGENCY; WILL FOLLOW FOR HOME NEEDS AS PT PROGRESSES.   Anticipated DC Date:  04/12/2012   Anticipated DC Plan:  HOME W HOME HEALTH SERVICES      DC Planning Services  CM consult      Choice offered to / List presented to:             Status of service:  In process, will continue to follow Medicare Important Message given?   (If response is "NO", the following Medicare IM given date fields will be blank) Date Medicare IM given:   Date Additional Medicare IM given:    Discharge Disposition:    Per UR Regulation:  Reviewed for med. necessity/level of care/duration of stay  If discussed at Long Length of Stay Meetings, dates discussed:    Comments:    Jerrell Belfast, RN, BSN Phone #[phone]

## 2012-04-10 ENCOUNTER — Encounter (HOSPITAL_COMMUNITY): Payer: Medicare Other

## 2012-04-10 LAB — GLUCOSE, CAPILLARY: Glucose-Capillary: 162 mg/dL — ABNORMAL HIGH (ref 70–99)

## 2012-04-10 MED ORDER — DOCUSATE SODIUM 100 MG PO CAPS
100.0000 mg | ORAL_CAPSULE | Freq: Two times a day (BID) | ORAL | Status: DC
  Administered 2012-04-10 – 2012-04-11 (×2): 100 mg via ORAL
  Filled 2012-04-10 (×11): qty 1

## 2012-04-10 MED ORDER — DEXTROSE 5 % IV SOLN
1.0000 g | INTRAVENOUS | Status: DC
  Administered 2012-04-10: 1 g via INTRAVENOUS
  Filled 2012-04-10 (×2): qty 1

## 2012-04-10 MED ORDER — CIPROFLOXACIN IN D5W 400 MG/200ML IV SOLN
400.0000 mg | Freq: Every day | INTRAVENOUS | Status: DC
  Administered 2012-04-11: 400 mg via INTRAVENOUS
  Filled 2012-04-10: qty 200

## 2012-04-10 MED ORDER — BISACODYL 5 MG PO TBEC
5.0000 mg | DELAYED_RELEASE_TABLET | Freq: Every day | ORAL | Status: DC | PRN
  Administered 2012-04-10: 5 mg via ORAL
  Filled 2012-04-10: qty 1

## 2012-04-10 NOTE — Progress Notes (Signed)
ANTIBIOTIC CONSULT NOTE - FOLLOW UP  Pharmacy Consult for cefepime Indication: GNR in RLE wound  No Known Allergies  Patient Measurements: Height: 5\' 9"  (175.3 cm) Weight: 141 lb 8.6 oz (64.2 kg) IBW/kg (Calculated) : 70.7    Vital Signs: Temp: 96.9 F (36.1 C) (05/01 0615) Temp src: Oral (05/01 0615) BP: 134/60 mmHg (05/01 0615) Pulse Rate: 64  (05/01 0615) Intake/Output from previous day: 04/30 0701 - 05/01 0700 In: 720 [P.O.:720] Out: 850 [Urine:850] Intake/Output from this shift: Total I/O In: 240 [P.O.:240] Out: -   Labs:  Basename 04/08/12 1617  WBC 8.2  HGB 11.0*  PLT 144*  LABCREA --  CREATININE 1.90*   Estimated Creatinine Clearance: 26.3 ml/min (by C-G formula based on Cr of 1.9). No results found for this basename: VANCOTROUGH:2,VANCOPEAK:2,VANCORANDOM:2,GENTTROUGH:2,GENTPEAK:2,GENTRANDOM:2,TOBRATROUGH:2,TOBRAPEAK:2,TOBRARND:2,AMIKACINPEAK:2,AMIKACINTROU:2,AMIKACIN:2, in the last 72 hours   Microbiology: Recent Results (from the past 720 hour(s))  CULTURE, BLOOD (ROUTINE X 2)     Status: Normal (Preliminary result)   Collection Time   04/08/12  4:11 PM      Component Value Range Status Comment   Specimen Description BLOOD LEFT ARM   Final    Special Requests     Final    Value: BOTTLES DRAWN AEROBIC AND ANAEROBIC  AER 10CC, ANA 6CC   Culture  Setup Time [id number]   Final    Culture     Final    Value:        BLOOD CULTURE RECEIVED NO GROWTH TO DATE CULTURE WILL BE HELD FOR 5 DAYS BEFORE ISSUING A FINAL NEGATIVE REPORT   Report Status PENDING   Incomplete   CULTURE, BLOOD (ROUTINE X 2)     Status: Normal (Preliminary result)   Collection Time   04/08/12  4:16 PM      Component Value Range Status Comment   Specimen Description BLOOD RIGHT ARM   Final    Special Requests BOTTLES DRAWN AEROBIC AND ANAEROBIC 5CC   Final    Culture  Setup Time [id number]   Final    Culture     Final    Value:        BLOOD CULTURE RECEIVED NO GROWTH TO DATE CULTURE  WILL BE HELD FOR 5 DAYS BEFORE ISSUING A FINAL NEGATIVE REPORT   Report Status PENDING   Incomplete   CULTURE, ROUTINE-ABSCESS     Status: Normal (Preliminary result)   Collection Time   04/08/12 11:30 PM      Component Value Range Status Comment   GRAM STAIN No WBC Seen   Preliminary    GRAM STAIN No Squamous Epithelial Cells Seen   Preliminary    GRAM STAIN Moderate Gram Negative Rods   Preliminary      Assessment: 78 yo man with RLE cellulitis at site of vein harvesting s/p I&D 4/29.  T max 99.2,  Also hypothermic at 96.9.  Wound cx with GNRs. IV vanco stopped and cipro changed from po to IV.  D/w PA, Erin Barrett.  Will add anti-pseudomonal cephalosporin until sensitivities are reported.  Plan:  Cefepime 1 gm q24 until sensitivities reported. Then narrow abx. Continue on Cipro 400 mg IV q24.   Herby Abraham, Pharm.D. [phone] 04/10/2012 11:20 AM

## 2012-04-10 NOTE — Progress Notes (Addendum)
Subjective:  [name] [name] continues to complain of RLE discomfort.  He has remained febrile off and on.  Objective: Vital signs in last 24 hours: Temp:  [96.9 F (36.1 C)-99.2 F (37.3 C)] 96.9 F (36.1 C) (05/01 0615) Pulse Rate:  [64-68] 64  (05/01 0615) Cardiac Rhythm:  [-] Normal sinus rhythm (04/30 2030) Resp:  [16-18] 17  (05/01 0615) BP: (125-134)/(52-60) 134/60 mmHg (05/01 0615) SpO2:  [95 %] 95 % (05/01 0615)  Intake/Output from previous day: 04/30 0701 - 05/01 0700 In: 720 [P.O.:720] Out: 850 [Urine:850]  General appearance: alert, cooperative and no distress Heart: regular rate and rhythm Lungs: clear to auscultation bilaterally Abdomen: soft, non-tender; bowel sounds normal; no masses,  no organomegaly Extremities: edema trace Wound: appears to be worsening, leg is very erythematous, ecchymotic, drainage present has become purulent +Odor, wound tracks approximatly 6cm up leg  Lab Results:  [facility] 04/08/12 1617  WBC 8.2  HGB 11.0*  HCT 33.2*  PLT 144*   BMET:  Basename 04/08/12 1617  NA 138  K 4.2  CL 100  CO2 29  GLUCOSE 123*  BUN 32*  CREATININE 1.90*  CALCIUM 9.4    PT/INR: No results found for this basename: LABPROT,INR in the last 72 hours ABG    Component Value Date/Time   PHART 7.360 02/07/2012 2227   HCO3 24.7* 02/07/2012 2227   TCO2 25 02/08/2012 1730   ACIDBASEDEF 1.0 02/07/2012 2227   O2SAT 99.0 02/07/2012 2227   CBG (last 3)   Basename 04/10/12 0626 04/09/12 2020 04/09/12 1638  GLUCAP 128* 141* 138*    Assessment/Plan:  1. RLE Cellulitis- appears to be worsening, on Vancomycin and Cipro, wound culture is pending 2. ? Bactermia- blood cultures remain negative, will repeat CBC in AM 3. Renal Insufficiency- chronic 4. DM- CBGS, controlled with sliding scale 5. Dispo- wound appears to be worsening, will discuss with staff, may benefit from further debridement and washout in the OR   LOS: 2 days    BARRETT,  ERIN 04/10/2012   I have seen and examined the patient and agree with the assessment and plan as outlined.  However, I think his leg looks much better than it did 48 hours ago.  Wound culture is growing Gram negative rods - will change Cipro to IV and d/c Vanc.  I am not convinced that the wound needs to be opened further, but it might benefit from wound VAC due to the amount of drainage.  Will ask wound care team to place [facility].   Demetri Goshert H 04/10/2012 10:56 AM

## 2012-04-10 NOTE — Progress Notes (Signed)
   CARE MANAGEMENT NOTE 04/10/2012  Patient:  [name],[name]   Account Number:  [id number]  Date Initiated:  04/09/2012  Documentation initiated by:  Avie Arenas  Subjective/Objective Assessment:   Cellulitis - post CABG in Feb.  has spouse had HHRN with [facility] on last dc'.     Action/Plan:   PTA, PT INDEPENDENT, LIVES WITH SPOUSE.  WIFE TO PROVIDE CARE AT DISCHARGE.  CURRENTLY NOT ACTIVE WITH HH AGENCY; WILL FOLLOW FOR HOME NEEDS AS PT PROGRESSES.   Anticipated DC Date:  04/12/2012   Anticipated DC Plan:  HOME W HOME HEALTH SERVICES      DC Planning Services  CM consult      Choice offered to / List presented to:             Status of service:  In process, will continue to follow Medicare Important Message given?   (If response is "NO", the following Medicare IM given date fields will be blank) Date Medicare IM given:   Date Additional Medicare IM given:    Discharge Disposition:    Per UR Regulation:  Reviewed for med. necessity/level of care/duration of stay  If discussed at Long Length of Stay Meetings, dates discussed:    Comments:  04/10/12 [name] Jeon,RN,BSN 1200 WOUND VAC PLACED TODAY BY WOC RN.  MD:  PLEASE SIGN [employer] VAC INSURANCE AUTH FORM IF PT WILL NEED WOUND VAC FOR HOME. WILL FOLLOW. Phone #[phone]

## 2012-04-10 NOTE — Consult Note (Signed)
WOC consult Note Reason for Consult: requested to place NPWT VAC dressing to RLE wound.  Pt has small opening along surgical site from surgery in Feb. 2013.  Draining serous fluid upon my removal of the dressing today.   Wound type: surgical Measurement:1.5cm 0.5cm x 0.2cm Wound bed: unable to visualize Drainage (amount, consistency, odor) serous Periwound: surrounding erythema that extends up the extremity and induration. Dressing procedure/placement/frequency: applied VAC dressing to LE wound, covered intact skin with Mepitel first to protect skin, applied 1pc black foam to open area, mushroom with another pc. Of black foam to protect surrounding skin from [facility] pad.  Seal obtained at .  Pt. Tolerated without problems.  Education provided to pt and his wife on mobility with new tubing attached to leg and safety measures.  WOC will follow as needed for assistance with [facility] dressing Thanks  Thurlow Gallaga [employer], CWOCN [phone])

## 2012-04-11 DIAGNOSIS — L02419 Cutaneous abscess of limb, unspecified: Secondary | ICD-10-CM

## 2012-04-11 LAB — BASIC METABOLIC PANEL
CO2: 25 mEq/L (ref 19–32)
Calcium: 9.2 mg/dL (ref 8.4–10.5)
Creatinine, Ser: 2.11 mg/dL — ABNORMAL HIGH (ref 0.50–1.35)
GFR calc non Af Amer: 27 mL/min — ABNORMAL LOW (ref >90)
Glucose, Bld: 140 mg/dL — ABNORMAL HIGH (ref 70–99)

## 2012-04-11 LAB — GLUCOSE, CAPILLARY
Glucose-Capillary: 157 mg/dL — ABNORMAL HIGH (ref 70–99)
Glucose-Capillary: 102 mg/dL — ABNORMAL HIGH (ref 70–99)

## 2012-04-11 LAB — CBC
Hemoglobin: 10.1 g/dL — ABNORMAL LOW (ref 13.0–17.0)
MCH: 31.7 pg (ref 26.0–34.0)
MCHC: 33.3 g/dL (ref 30.0–36.0)
MCV: 95 fL (ref 78.0–100.0)
RBC: 3.19 MIL/uL — ABNORMAL LOW (ref 4.22–5.81)

## 2012-04-11 MED ORDER — OXYCODONE HCL 5 MG PO TABS: 5.0000 mg | ORAL_TABLET | ORAL | Status: AC | PRN

## 2012-04-11 MED ORDER — CIPROFLOXACIN HCL 250 MG PO TABS
250.0000 mg | ORAL_TABLET | Freq: Two times a day (BID) | ORAL | Status: DC
  Administered 2012-04-11 – 2012-04-15 (×8): 250 mg via ORAL
  Filled 2012-04-11 (×10): qty 1

## 2012-04-11 NOTE — Progress Notes (Signed)
CARDIAC REHAB PHASE I   PRE:  Rate/Rhythm: 56SB  BP:  Supine: 110/40  Sitting:   Standing:    SaO2: 96%RA  MODE:  Ambulation: 440 ft   POST:  Rate/Rhythem: 59  BP:  Supine:   Sitting: 122/56  Standing:    SaO2: 98%RA 1337-1410 Pt walked 440 ft on RA with rolling walker and asst x 1. Pt able to bear weight on both legs and stated it felt good to be walking. He stated it did not hurt as he thought it would. He was very pleased with his walk as I was. Mainly walked with pt for safety. Tolerated very well. Back to bed after walk. Wound vac intact.  [name] [name]

## 2012-04-11 NOTE — Progress Notes (Signed)
Pt has had a good BM today and does not want any stool softeners or laxatives for now. Will continue to monitor. [name] [name]

## 2012-04-11 NOTE — Care Management Note (Signed)
    Page 1 of 2   04/11/2012     3:13:09 PM   CARE MANAGEMENT NOTE 04/11/2012  Patient:  [name] [name], [name] [name]   Account Number:  [id number]  Date Initiated:  04/09/2012  Documentation initiated by:  Avie Arenas  Subjective/Objective Assessment:   Cellulitis - post CABG in Feb.  has spouse had HHRN with [facility] on last dc'.     Action/Plan:   PTA, PT INDEPENDENT, LIVES WITH SPOUSE.  WIFE TO PROVIDE CARE AT DISCHARGE.  CURRENTLY NOT ACTIVE WITH HH AGENCY; WILL FOLLOW FOR HOME NEEDS AS PT PROGRESSES.   Anticipated DC Date:  04/12/2012   Anticipated DC Plan:  HOME W HOME HEALTH SERVICES      DC Planning Services  CM consult      [facility] Choice  HOME HEALTH   Choice offered to / List presented to:  C-1 Patient   DME arranged  VAC      DME agency  [facility]     HH arranged  HH-1 RN      [facility] agency  [facility]   Status of service:  In process, will continue to follow Medicare Important Message given?   (If response is "NO", the following Medicare IM given date fields will be blank) Date Medicare IM given:   Date Additional Medicare IM given:    Discharge Disposition:  HOME W HOME HEALTH SERVICES  Per UR Regulation:  Reviewed for med. necessity/level of care/duration of stay  If discussed at Long Length of Stay Meetings, dates discussed:    Comments:  04/11/12 Vencent Hauschild,RN,BSN 1150 CM REFERRAL FOR HOME WOUND VAC.  MET WITH PT AND FAMILY TO DISCUSS HOME HEALTH CHOICE; THEY PREFER [facility] FOR HOME HEALTH FOLLOW UP.  PT MAY NEED IV ANTIBIOTICS AT DC; WILL FOLLOW FOR CULTURE RESULTS.  WILL COMPLETE AND FAX HOME VAC INSURANCE AUTHORIZATION FORMS TO [employer].  AWAIT INSURANCE APPROVAL AND DELIVERY OF HOME VAC.  PT/FAMILY HAVE LOTS OF QUESTIONS:  ERIN BARRETT, PA NOTIFIED AND CAME TO PT'S ROOM TO ANSWER ALL QUESTIONS.  04/10/12 Sonny Anthes,RN,BSN 1200 WOUND VAC PLACED TODAY BY WOC RN.  MD:  PLEASE SIGN [employer] VAC INSURANCE AUTH FORM IF PT WILL NEED WOUND VAC FOR HOME. WILL FOLLOW.

## 2012-04-11 NOTE — Progress Notes (Addendum)
Subjective:  [name] [name] has no complaints this morning.  He states the pain in his RLE has improved.  He underwent wound vac placement yesterday  Objective: Vital signs in last 24 hours: Temp:  [95.7 F (35.4 C)-98.2 F (36.8 C)] 95.7 F (35.4 C) (05/02 0517) Pulse Rate:  [55-63] 63  (05/02 0517) Cardiac Rhythm:  [-] Sinus bradycardia;Normal sinus rhythm (05/02 0738) Resp:  [16-19] 19  (05/02 0517) BP: (114-160)/(53-70) 159/68 mmHg (05/02 0517) SpO2:  [96 %-98 %] 96 % (05/02 0517)  Intake/Output from previous day: 05/01 0701 - 05/02 0700 In: 720 [P.O.:720] Out: 825 [Urine:825]    General appearance: alert, cooperative and no distress Heart: regular rate and rhythm Lungs: clear to auscultation bilaterally Abdomen: soft, non-tender; bowel sounds normal; no masses,  no organomegaly Extremities: edema trace Wound: wound vac in place, surrounding tissue appears less erythematous  Lab Results:  Basename 04/11/12 0635 04/08/12 1617  WBC 7.9 8.2  HGB 10.1* 11.0*  HCT 30.3* 33.2*  PLT 121* 144*   BMET:  Basename 04/08/12 1617  NA 138  K 4.2  CL 100  CO2 29  GLUCOSE 123*  BUN 32*  CREATININE 1.90*  CALCIUM 9.4    PT/INR: No results found for this basename: LABPROT,INR in the last 72 hours ABG    Component Value Date/Time   PHART 7.360 02/07/2012 2227   HCO3 24.7* 02/07/2012 2227   TCO2 25 02/08/2012 1730   ACIDBASEDEF 1.0 02/07/2012 2227   O2SAT 99.0 02/07/2012 2227   CBG (last 3)   Basename 04/11/12 0632 04/10/12 2053 04/10/12 1555  GLUCAP 134* 131* 143*    Assessment/Plan:  1. RLE Cellulitis- + GNR on culture, currently on IV Cipro and Cefipime, sensitivities pending 2. Wound Care- wound vac applied yesterday, will likely be changed tomorrow 3. Renal Insuffiency- chronic 4. Dispo- pending sensitivity results, once obtained patient will be ready for discharge   LOS: 3 days    BARRETT, ERIN 04/11/2012   Leg wound culture grew Gram neg rods.  ID and  sens pending. He can be on oral cipro for this. Will put on 250mg  every 12 hrs with creat of 2.0. Follow up on ID and sens.  Will dc maxepime. He still has a lot of pain around right knee when getting up to ambulate but this is getting better.  He will be able to go home with wound vac as soon as he is able to ambulate independently. He has to go up a long flight of stairs just to get into his house.

## 2012-04-11 NOTE — Discharge Summary (Signed)
Physician Discharge Summary  Patient ID: [name] [name] MRN: [id number] DOB/AGE: [birth year] 78 y.o.  Admit date: 04/08/2012 Discharge date: 04/15/2012  Admission Diagnoses:  Patient Active Problem List  Diagnoses  . Hypertension  . Severe Aortic Stenosis  . NSTEMI (non-ST elevated myocardial infarction)  . Acute on chronic renal failure  . Anemia  . Hypokalemia  . CAD  . Chronic systolic heart failure  . Ischemic cardiomyopathy  . CKD (chronic kidney disease)  . HLD (hyperlipidemia)  . Cellulitis and abscess of leg   Discharge Diagnoses:   Patient Active Problem List  Diagnoses  . Hypertension  . Severe Aortic Stenosis  . NSTEMI (non-ST elevated myocardial infarction)  . Acute on chronic renal failure  . Anemia  . Hypokalemia  . CAD  . Chronic systolic heart failure  . Ischemic cardiomyopathy  . CKD (chronic kidney disease)  . HLD (hyperlipidemia)  . Cellulitis and abscess of leg   Discharged Condition: good  History of Present Illness:   [name] [name] is an 78 yo male S/P AVR/CABG performed by Dr. Laneta Simmers in 02/07/2012. At his postoperative visit he was found to have a large sided left pleural effusion, for which he underwent IR guided Thoracentesis with approximately 1L of serosaguinous fluid evacuated. He last followed up with Dr. Laneta Simmers on 03/25/2012 at which time he was doing very well. He contacted the [facility] office on 04/08/2012 with a complaint of RLE swelling, reddness, and painful to touch.  He was scheduled for clinic that afternoon.  The patient presented at which time he stated his leg was so tender he was not able to get up out of bed and walk.  He was febrile on exam in the office and his RLE was erythematous, warm to touch, and there was a collection of fluid in the RLE endovein harvest tract.  Due to the appearance of the wound it was felt the patient should undergo ID with wound culture and subsequent admission to the hospital for treatment with IV antibiotics.   The wound was lanced with approximately 2cm incision, the wound drained approximately 200c of serosanguinous fluid, which did not appear to be purulent however there was an odor present.  The wound was cleaned and packed with iodophrom gauze.  Wound culture was obtained during the procedure.  Hospital Course:   Upon arrival to the hospital from our office the patient was placed on IV Vancomycin and labs and blood cultures were obtained.  The patient's CBC was WNL and his creatinine was elevated which appeared to be his baseline value.  HD #1 the patients wound was again assessed.  It appeared clean with continue drainage of serosanguinous fluid.  The patient remained febrile.  The patient was started on oral Ciprofloxacin for additional coverage.  HD #2 the patient's blood cultures were negative but would be monitored for 5 days total.  The patients wound culture was positive for gram negative rods.  His Vancomycin was discontinued.  He was placed on IV Cefipime per pharmacy recommendations and his Cipro regimen was changed to IV as well.  The patients wound continued to drain serosanquinous fluid.  Wound care was consulted and placed a wound vac over the incision.  HD #3 the patient is doing better.  He is no longer febrile and states he is not experiencing as much pain in his RLE. HD #4  Final culture results were positive for Serratia Marcessans, which is sensitive to Ciprofloxacin.  HD #6 the patients wound was re-evaluated and it  was felt there would not be need of a wound vac.  The patient has approximately a 1 inch incision of right calf that is draining serosanquinous fluid.  He will require wet to dry dressing changes daily.  He will remain on Ciprofloxacin 250mg  PO BID.  Today is day 5/14.  He is medically stable and will be discharged home today.  He is scheduled to follow up with Dr. Laneta Simmers on 04/23/2012 at 12:00 Disposition: 01-Home or Self Care  Discharge Orders    Future Appointments: Provider:  Department: Dept Phone: Center:   04/23/2012 12:00 PM [name], MD [facility] [phone] [facility]   05/29/2012 12:15 PM Gaylord Shih, MD [facility] [phone] [facility]     Medication List  As of 04/15/2012  8:48 AM   STOP taking these medications         traMADol 50 MG tablet         TAKE these medications         aspirin 325 MG EC tablet   Take 1 tablet (325 mg total) by mouth daily.      carvedilol 25 MG tablet   Commonly known as: COREG   Take 1 tablet (25 mg total) by mouth 2 (two) times daily with a meal.      cholecalciferol 1000 UNITS tablet   Commonly known as: VITAMIN D   Take 2,000 Units by mouth daily.      ciprofloxacin 250 MG tablet   Commonly known as: CIPRO   Take 1 tablet (250 mg total) by mouth 2 (two) times daily.      furosemide 40 MG tablet   Commonly known as: LASIX   Take 1 tablet (40 mg total) by mouth daily.      metipranolol 0.3 % ophthalmic solution   Commonly known as: OPTIPRANOLOL   Place 1 drop into both eyes 2 (two) times daily.      multivitamin capsule   Take 1 capsule by mouth daily.      omeprazole 20 MG capsule   Commonly known as: PRILOSEC   Take 20 mg by mouth daily.      oxyCODONE 5 MG immediate release tablet   Commonly known as: Oxy IR/ROXICODONE   Take 1 tablet (5 mg total) by mouth every 4 (four) hours as needed.      simvastatin 10 MG tablet   Commonly known as: ZOCOR   Take 10 mg by mouth at bedtime.      Tamsulosin HCl 0.4 MG Caps   Commonly known as: FLOMAX   Take 1 capsule (0.4 mg total) by mouth daily after supper.           Follow-up Information    Follow up with [name], MD on 04/23/2012. (Appointment is at 12:00pm)    Contact information:   [address] [place] [address] [phone]       Follow up with [facility]. (please get chest xray performed one hour prior to appoitment  with Dr. Laneta Simmers)    Contact information:   [address], [address]          Signed: [name] 04/15/2012, 8:48 AM

## 2012-04-12 ENCOUNTER — Encounter (HOSPITAL_COMMUNITY): Payer: Medicare Other

## 2012-04-12 ENCOUNTER — Inpatient Hospital Stay (HOSPITAL_COMMUNITY): Payer: Medicare Other

## 2012-04-12 LAB — GLUCOSE, CAPILLARY
Glucose-Capillary: 118 mg/dL — ABNORMAL HIGH (ref 70–99)
Glucose-Capillary: 95 mg/dL (ref 70–99)
Glucose-Capillary: 140 mg/dL — ABNORMAL HIGH (ref 70–99)

## 2012-04-12 LAB — CULTURE, ROUTINE-ABSCESS
Gram Stain: NONE SEEN
Gram Stain: NONE SEEN

## 2012-04-12 MED ORDER — CIPROFLOXACIN HCL 500 MG PO TABS: 500.0000 mg | ORAL_TABLET | Freq: Two times a day (BID) | ORAL | Status: DC

## 2012-04-12 MED ORDER — CIPROFLOXACIN HCL 250 MG PO TABS: 500.0000 mg | ORAL_TABLET | Freq: Two times a day (BID) | ORAL | Status: DC

## 2012-04-12 NOTE — Progress Notes (Signed)
CARDIAC REHAB PHASE I   PRE:  Rate/Rhythm: 53SB  BP:  Supine: 100/60  Sitting:   Standing:    SaO2: 95%RA  MODE:  Ambulation: 500 ft   POST:  Rate/Rhythem: 56  BP:  Supine: 130/60  Sitting:   Standing:    SaO2: 97%RA 1441-1507 Pt walked 500 ft on RA with rolling walker and minimal asst. Gait steady. Tolerated well. Wants to go home.  [name] [name]

## 2012-04-12 NOTE — Progress Notes (Addendum)
Subjective:  [name] is doing well this morning.  He states he was able to ambulate through the hallway without difficulty.  He states the pain in his leg is better.   Objective: Vital signs in last 24 hours: Temp:  [98.2 F (36.8 C)-98.6 F (37 C)] 98.4 F (36.9 C) (05/03 0440) Pulse Rate:  [54-58] 58  (05/03 0440) Cardiac Rhythm:  [-] Sinus bradycardia (05/02 1940) Resp:  [16-18] 16  (05/03 0440) BP: (124-153)/(48-65) 153/65 mmHg (05/03 0440) SpO2:  [93 %-98 %] 93 % (05/03 0440)  Intake/Output from previous day: 05/02 0701 - 05/03 0700 In: 720 [P.O.:720] Out: 801 [Urine:750; Drains:50; Stool:1]    General appearance: alert, cooperative and no distress Heart: regular rate and rhythm Lungs: clear to auscultation bilaterally Abdomen: soft, non-tender; bowel sounds normal; no masses,  no organomegaly Extremities: edema trace Wound: wound vac in place, erythema improved, still warm to touch  Lab Results:  [facility] 04/11/12 0635  WBC 7.9  HGB 10.1*  HCT 30.3*  PLT 121*   BMET:  Basename 04/11/12 0635  NA 137  K 4.4  CL 101  CO2 25  GLUCOSE 140*  BUN 39*  CREATININE 2.11*  CALCIUM 9.2    PT/INR: No results found for this basename: LABPROT,INR in the last 72 hours ABG    Component Value Date/Time   PHART 7.360 02/07/2012 2227   HCO3 24.7* 02/07/2012 2227   TCO2 25 02/08/2012 1730   ACIDBASEDEF 1.0 02/07/2012 2227   O2SAT 99.0 02/07/2012 2227   CBG (last 3)   Basename 04/12/12 0610 04/11/12 2024 04/11/12 1648  GLUCAP 118* 144* 102*    Assessment/Plan:  1. RLE Cellulitis- culture growing GNR, on Cipro and Cefipime awaiting final results 2. Wound care- wound vac in place, will likely be changed today 3. DM- CBGs being controlled with diet and sliding scale insulin prn 4. Renal Insufficiency- chronic 5. Dispo- patient will need wound vac at discharge, awaiting final culture report to make appropriate antibiotic treatment plan, will d/c once this report is  obtained    LOS: 4 days    Raford Pitcher, ERIN 04/12/2012   Chart reviewed, patient examined, agree with above. He is walking well without pain.  Followup on ID and sens of GNR in wound.  Wound vac change today.  He can go home later today or tomorrow.  Will repeat chest xray while he is here since he had some effusion and left thoracentesis before.

## 2012-04-13 LAB — GLUCOSE, CAPILLARY: Glucose-Capillary: 129 mg/dL — ABNORMAL HIGH (ref 70–99)

## 2012-04-13 NOTE — Progress Notes (Addendum)
                    [address]            [name] [address]          [phone]          Subjective: Feels well, no complaints.  Objective: Vital signs in last 24 hours: Patient Vitals for the past 24 hrs:  BP Temp Temp src Pulse Resp SpO2  04/13/12 0424 144/57 mmHg 98.6 F (37 C) Oral 65  19  97 %  04/12/12 2127 142/58 mmHg 98.4 F (36.9 C) Oral 57  18  93 %  04/12/12 1627 140/56 mmHg - - 60  - -  04/12/12 1337 115/57 mmHg 98.4 F (36.9 C) Oral 55  18  95 %  04/12/12 1015 139/60 mmHg - - 68  - -   Current Weight  04/08/12 64.2 kg (141 lb 8.6 oz)     Intake/Output from previous day: 05/03 0701 - 05/04 0700 In: 720 [P.O.:720] Out: 700 [Urine:700]    PHYSICAL EXAM:  Heart: RRR Lungs: clear Wound: RLE with decreased erythema, no fluctuance, minimal edema.  VAC in place R calf with serosanguinous drainage.   Lab Results: CBC: Basename 04/11/12 0635  WBC 7.9  HGB 10.1*  HCT 30.3*  PLT 121*   BMET:  Basename 04/11/12 0635  NA 137  K 4.4  CL 101  CO2 25  GLUCOSE 140*  BUN 39*  CREATININE 2.11*  CALCIUM 9.2    PT/INR: No results found for this basename: LABPROT,INR in the last 72 hours   Assessment/Plan: Serratia RLE cellulitis- improving.  Continue Cipro, VAC. Stable otherwise, continue current care.   LOS: 5 days    COLLINS,GINA H 04/13/2012   I have seen and examined [name] [name] and agree with the above assessment  and plan.  Delight Ovens MD Beeper [phone] Office [phone] 04/13/2012 12:03 PM

## 2012-04-13 NOTE — Progress Notes (Addendum)
CARDIAC REHAB PHASE I   PRE:  Rate/Rhythm: 64 NSR  BP:  Supine:   Sitting: 120/60  Standing:    SaO2: 100%  MODE:  Ambulation: 550 ft   POST:  Rate/Rhythem: 59 SB  BP:  Supine:   Sitting: 150/68  Standing:    SaO2: 99%   Pt ambulated with RN X1 assist for safety without difficulty. Steady gait, asymptomatic.  Will continue to follow.   Esaias Cleavenger [place]  12:10-12:37pm

## 2012-04-14 NOTE — Progress Notes (Addendum)
                    [address]            [place] [address]          [phone]          Subjective: No complaints.  Objective: Vital signs in last 24 hours: Patient Vitals for the past 24 hrs:  BP Temp Temp src Pulse Resp SpO2 Weight  04/14/12 0630 - - - 65  - - -  04/14/12 0429 149/62 mmHg 98.5 F (36.9 C) Oral 58  18  91 % 63.3 kg (139 lb 8.8 oz)  04/13/12 2002 128/63 mmHg 98.6 F (37 C) Oral 56  18  97 % -  04/13/12 1440 140/57 mmHg 98.6 F (37 C) Oral 67  18  97 % -   Current Weight  04/14/12 63.3 kg (139 lb 8.8 oz)     Intake/Output from previous day: 05/04 0701 - 05/05 0700 In: 480 [P.O.:480] Out: 775 [Urine:775]    PHYSICAL EXAM:  Heart: RRR Lungs: clear Wound: RLE with VAC in place, surrounding erythema nearly resolved. Extremities: mild RLE edema  Lab Results: CBC:No results found for this basename: WBC:2,HGB:2,HCT:2,PLT:2 in the last 72 hours BMET: No results found for this basename: NA:2,K:2,CL:2,CO2:2,GLUCOSE:2,BUN:2,CREATININE:2,CALCIUM:2 in the last 72 hours  PT/INR: No results found for this basename: LABPROT,INR in the last 72 hours   Assessment/Plan: Serratia RLE wound infection/cellulitis- Continue VAC, antibiotic therapy (Vanc/Cipro). For VAC change in am.     LOS: 6 days    COLLINS,GINA H 04/14/2012   swelling and redness much better today. Wound Vac nurse to coordinated with Dr Laneta Simmers in am to check wound prob d/c home in am

## 2012-04-15 ENCOUNTER — Encounter (HOSPITAL_COMMUNITY): Payer: Medicare Other

## 2012-04-15 LAB — CBC
HCT: 27.9 % — ABNORMAL LOW (ref 39.0–52.0)
Hemoglobin: 9.3 g/dL — ABNORMAL LOW (ref 13.0–17.0)
MCV: 92.4 fL (ref 78.0–100.0)
RBC: 3.02 MIL/uL — ABNORMAL LOW (ref 4.22–5.81)
WBC: 5.9 10*3/uL (ref 4.0–10.5)

## 2012-04-15 LAB — CULTURE, BLOOD (ROUTINE X 2)
Culture  Setup Time: 201304300111
Culture: NO GROWTH

## 2012-04-15 MED ORDER — CIPROFLOXACIN HCL 250 MG PO TABS: 250.0000 mg | ORAL_TABLET | Freq: Two times a day (BID) | ORAL | Status: AC

## 2012-04-15 NOTE — Progress Notes (Addendum)
Subjective:  [name] [name] has no complaints this morning.  He was unable to get a wound vac on Friday, being per [facility] wound does not meet requirements for wound vac.   Objective: Vital signs in last 24 hours: Temp:  [98 F (36.7 C)-98.7 F (37.1 C)] 98 F (36.7 C) (05/06 0419) Pulse Rate:  [54-68] 57  (05/06 0419) Cardiac Rhythm:  [-] Sinus bradycardia (05/05 1930) Resp:  [18-19] 19  (05/06 0419) BP: (140-155)/(56-69) 153/69 mmHg (05/06 0419) SpO2:  [92 %-95 %] 95 % (05/06 0419)  Intake/Output from previous day: 05/05 0701 - 05/06 0700 In: 720 [P.O.:720] Out: 351 [Urine:350; Stool:1]    General appearance: alert, cooperative and no distress Neurologic: intact Heart: regular rate and rhythm Lungs: clear to auscultation bilaterally Abdomen: soft, non-tender; bowel sounds normal; no masses,  no organomegaly Extremities: edema RLE 1+ LLE trace Wound: wound vac in place, wound appears to be less erythematous, minimal drainage output  Lab Results:  Basename 04/15/12 0445  WBC 5.9  HGB 9.3*  HCT 27.9*  PLT 171   BMET: No results found for this basename: NA:2,K:2,CL:2,CO2:2,GLUCOSE:2,BUN:2,CREATININE:2,CALCIUM:2 in the last 72 hours  PT/INR: No results found for this basename: LABPROT,INR in the last 72 hours ABG    Component Value Date/Time   PHART 7.360 02/07/2012 2227   HCO3 24.7* 02/07/2012 2227   TCO2 25 02/08/2012 1730   ACIDBASEDEF 1.0 02/07/2012 2227   O2SAT 99.0 02/07/2012 2227   CBG (last 3)   Basename 04/15/12 0555 04/14/12 2152 04/14/12 1607  GLUCAP 105* 191* 117*    Assessment/Plan:  1. RLE Cellulitis- + Serratia Marcescens, on Cipro 2. Wound care- to be changed this morning, Dr. Laneta Simmers to evaluate wound and assess need for wound vac 3. Dispo- patient stable for discharge today, we are having issues with getting wound vac approval.  Most likely patient can be discharged home with routine wound care per home health.   LOS: 7 days    [name] 04/15/2012    Chart reviewed, patient examined, agree with above. Wound vac removed.  The wound is tiny with minimal serous drainage.  He just needs a small wet to dry saline dressing to keep the edges of the wound apart so that any further fluid will drain.  Continue cipro for serratia in culture.  Will send home today with [facility] to do dressing.  He can return to cardiac rehab now.

## 2012-04-15 NOTE — Progress Notes (Signed)
CARDIAC REHAB PHASE I   PRE:  Rate/Rhythm: 66SR  BP:  Supine: 150/58  Sitting:   Standing:    SaO2: 96%RA  MODE:  Ambulation: 550 ft   POST:  Rate/Rhythem: 72  BP:  Supine:   Sitting: 138/60  Standing:    SaO2: 97%RA 0837-0900 Pt walked 550 ft on RA with rolling walker and minimal asst. Has walker at home. Tolerated well. Will notify CRP 2 that pt for discharge today. Encouraged pt to increase walking at home to prepare for return to program.  [name] [name]

## 2012-04-16 DIAGNOSIS — I10 Essential (primary) hypertension: Secondary | ICD-10-CM | POA: Diagnosis not present

## 2012-04-16 DIAGNOSIS — I214 Non-ST elevation (NSTEMI) myocardial infarction: Secondary | ICD-10-CM | POA: Diagnosis not present

## 2012-04-16 DIAGNOSIS — E119 Type 2 diabetes mellitus without complications: Secondary | ICD-10-CM | POA: Diagnosis not present

## 2012-04-16 DIAGNOSIS — I509 Heart failure, unspecified: Secondary | ICD-10-CM | POA: Diagnosis not present

## 2012-04-16 DIAGNOSIS — I2510 Atherosclerotic heart disease of native coronary artery without angina pectoris: Secondary | ICD-10-CM | POA: Diagnosis not present

## 2012-04-16 DIAGNOSIS — I359 Nonrheumatic aortic valve disorder, unspecified: Secondary | ICD-10-CM | POA: Diagnosis not present

## 2012-04-17 ENCOUNTER — Encounter (HOSPITAL_COMMUNITY): Payer: Medicare Other

## 2012-04-17 DIAGNOSIS — I214 Non-ST elevation (NSTEMI) myocardial infarction: Secondary | ICD-10-CM | POA: Diagnosis not present

## 2012-04-17 DIAGNOSIS — E119 Type 2 diabetes mellitus without complications: Secondary | ICD-10-CM | POA: Diagnosis not present

## 2012-04-17 DIAGNOSIS — I509 Heart failure, unspecified: Secondary | ICD-10-CM | POA: Diagnosis not present

## 2012-04-17 DIAGNOSIS — I359 Nonrheumatic aortic valve disorder, unspecified: Secondary | ICD-10-CM | POA: Diagnosis not present

## 2012-04-17 DIAGNOSIS — I10 Essential (primary) hypertension: Secondary | ICD-10-CM | POA: Diagnosis not present

## 2012-04-17 DIAGNOSIS — I2510 Atherosclerotic heart disease of native coronary artery without angina pectoris: Secondary | ICD-10-CM | POA: Diagnosis not present

## 2012-04-19 ENCOUNTER — Other Ambulatory Visit: Payer: Self-pay | Admitting: Surgery

## 2012-04-19 ENCOUNTER — Encounter (HOSPITAL_COMMUNITY): Payer: Medicare Other

## 2012-04-19 DIAGNOSIS — I10 Essential (primary) hypertension: Secondary | ICD-10-CM | POA: Diagnosis not present

## 2012-04-19 DIAGNOSIS — I2510 Atherosclerotic heart disease of native coronary artery without angina pectoris: Secondary | ICD-10-CM

## 2012-04-19 DIAGNOSIS — I214 Non-ST elevation (NSTEMI) myocardial infarction: Secondary | ICD-10-CM | POA: Diagnosis not present

## 2012-04-19 DIAGNOSIS — E119 Type 2 diabetes mellitus without complications: Secondary | ICD-10-CM | POA: Diagnosis not present

## 2012-04-19 DIAGNOSIS — I359 Nonrheumatic aortic valve disorder, unspecified: Secondary | ICD-10-CM | POA: Diagnosis not present

## 2012-04-19 DIAGNOSIS — I509 Heart failure, unspecified: Secondary | ICD-10-CM | POA: Diagnosis not present

## 2012-04-22 ENCOUNTER — Encounter (HOSPITAL_COMMUNITY): Payer: Medicare Other

## 2012-04-23 ENCOUNTER — Ambulatory Visit: Payer: Medicare Other

## 2012-04-23 ENCOUNTER — Ambulatory Visit
Admission: RE | Admit: 2012-04-23 | Discharge: 2012-04-23 | Disposition: A | Discharge status: Home / Self Care | Payer: Medicare Other | Source: Ambulatory Visit | Attending: Surgery | Admitting: Surgery

## 2012-04-23 ENCOUNTER — Encounter: Payer: Self-pay | Admitting: Surgery

## 2012-04-23 ENCOUNTER — Ambulatory Visit (INDEPENDENT_AMBULATORY_CARE_PROVIDER_SITE_OTHER): Payer: Self-pay | Admitting: Surgery

## 2012-04-23 VITALS — BP 148/72 | HR 56 | Resp 18 | Ht 69.0 in | Wt 146.0 lb

## 2012-04-23 DIAGNOSIS — Z954 Presence of other heart-valve replacement: Secondary | ICD-10-CM

## 2012-04-23 DIAGNOSIS — J90 Pleural effusion, not elsewhere classified: Secondary | ICD-10-CM | POA: Diagnosis not present

## 2012-04-23 DIAGNOSIS — Z951 Presence of aortocoronary bypass graft: Secondary | ICD-10-CM

## 2012-04-23 DIAGNOSIS — T8140XA Infection following a procedure, unspecified, initial encounter: Secondary | ICD-10-CM

## 2012-04-23 DIAGNOSIS — I2510 Atherosclerotic heart disease of native coronary artery without angina pectoris: Secondary | ICD-10-CM

## 2012-04-23 DIAGNOSIS — J9819 Other pulmonary collapse: Secondary | ICD-10-CM | POA: Diagnosis not present

## 2012-04-23 DIAGNOSIS — T8149XA Infection following a procedure, other surgical site, initial encounter: Secondary | ICD-10-CM

## 2012-04-23 DIAGNOSIS — Z952 Presence of prosthetic heart valve: Secondary | ICD-10-CM

## 2012-04-23 NOTE — Progress Notes (Signed)
HPI:  The patient returns today for examination of his right leg saphenous vein harvest wound. He was recently hospitalized for wound infection with Serratia and was treated briefly with a wound VAC. Since discharge, a home health nurse has been doing wet-to-dry dressings once daily and he has been maintained on Cipro. He denies any fever or chills. He feels well overall.  Current Outpatient Prescriptions  Medication Sig Dispense Refill  . aspirin EC 325 MG EC tablet Take 1 tablet (325 mg total) by mouth daily.  30 tablet    . cholecalciferol (VITAMIN D) 1000 UNITS tablet Take 2,000 Units by mouth daily.       . ciprofloxacin (CIPRO) 250 MG tablet Take 1 tablet (250 mg total) by mouth 2 (two) times daily.  30 tablet  0  . furosemide (LASIX) 40 MG tablet Take 1 tablet (40 mg total) by mouth daily.  14 tablet  0  . metipranolol (OPTIPRANOLOL) 0.3 % ophthalmic solution Place 1 drop into both eyes 2 (two) times daily.       . Multiple Vitamin (MULTIVITAMIN) capsule Take 1 capsule by mouth daily.        Marland Kitchen omeprazole (PRILOSEC) 20 MG capsule Take 20 mg by mouth daily.        . simvastatin (ZOCOR) 10 MG tablet Take 10 mg by mouth at bedtime.        . Tamsulosin HCl (FLOMAX) 0.4 MG CAPS Take 1 capsule (0.4 mg total) by mouth daily after supper.  30 capsule  1  . carvedilol (COREG) 25 MG tablet Take 1 tablet (25 mg total) by mouth 2 (two) times daily with a meal.  60 tablet  1  . DISCONTD: metFORMIN (GLUCOPHAGE) 500 MG tablet Take 500-1,000 mg by mouth. 2 tabs in the am, 1 tab in the pm      . DISCONTD: potassium chloride SA (K-DUR,KLOR-CON) 20 MEQ tablet Take 1 tablet (20 mEq total) by mouth daily.  14 tablet  3     Physical Exam: BP 148/72  Pulse 56  Resp 18  Ht 5\' 9"  (1.753 m)  Wt 146 lb (66.225 kg)  BMI 21.56 kg/m2  SpO2 99% He looks well. Lung exam reveals slight decreased breath sounds at left base. Cardiac exam shows a regular rate and rhythm with normal valve sounds. Chest incision is  healing well and the sternum is stable. The right leg vein harvest wound is clean and almost healed. There is mild residual induration along the vein harvest tunnel. There is mild right lower leg edema.  Diagnostic Tests:  CHEST - 2 VIEW   Comparison: Most recent 04/12/2012   Findings: Cardiomegaly.  Previous median sternotomy for aortic valve replacement.  Improving left pleural effusion.  Mild left basilar atelectasis is improved.  Clear right lung.  No bony abnormality.   IMPRESSION: Improving left pleural effusion and left basilar atelectasis.   Original Report Authenticated By: Elsie Stain, M.D.  Impression: His leg wound infection is resolving with dressing changes and antibiotics. He will complete his course of Cipro which is due stop next week. His wife is going to do once daily wet-to-dry dressing changes until the wound is too small to use a dressing on. He can resume cardiac rehabilitation.  Plan: I will plan to see him back in 3 weeks for followup.

## 2012-04-24 ENCOUNTER — Encounter (HOSPITAL_COMMUNITY): Payer: Medicare Other

## 2012-04-25 ENCOUNTER — Encounter (HOSPITAL_COMMUNITY)
Admission: RE | Admit: 2012-04-25 | Discharge: 2012-04-25 | Disposition: A | Discharge status: Home / Self Care | Payer: Medicare Other | Source: Ambulatory Visit | Attending: Cardiology | Admitting: Cardiology

## 2012-04-25 DIAGNOSIS — Z954 Presence of other heart-valve replacement: Secondary | ICD-10-CM | POA: Insufficient documentation

## 2012-04-25 DIAGNOSIS — Z5189 Encounter for other specified aftercare: Secondary | ICD-10-CM | POA: Insufficient documentation

## 2012-04-25 DIAGNOSIS — E785 Hyperlipidemia, unspecified: Secondary | ICD-10-CM | POA: Insufficient documentation

## 2012-04-25 DIAGNOSIS — I359 Nonrheumatic aortic valve disorder, unspecified: Secondary | ICD-10-CM | POA: Insufficient documentation

## 2012-04-25 DIAGNOSIS — I129 Hypertensive chronic kidney disease with stage 1 through stage 4 chronic kidney disease, or unspecified chronic kidney disease: Secondary | ICD-10-CM | POA: Insufficient documentation

## 2012-04-25 DIAGNOSIS — I252 Old myocardial infarction: Secondary | ICD-10-CM | POA: Insufficient documentation

## 2012-04-25 DIAGNOSIS — N189 Chronic kidney disease, unspecified: Secondary | ICD-10-CM | POA: Insufficient documentation

## 2012-04-25 DIAGNOSIS — E119 Type 2 diabetes mellitus without complications: Secondary | ICD-10-CM | POA: Insufficient documentation

## 2012-04-25 DIAGNOSIS — Z951 Presence of aortocoronary bypass graft: Secondary | ICD-10-CM | POA: Insufficient documentation

## 2012-04-25 NOTE — Progress Notes (Signed)
Cardiac Rehab Medication Review by a Pharmacist  Does the patient  feel that his/her medications are working for him/her?  yes  Has the patient been experiencing any side effects to the medications prescribed?  no  Does the patient measure his/her own blood pressure or blood glucose at home?  no   Does the patient have any problems obtaining medications due to transportation or finances?   no  Understanding of regimen: good Understanding of indications: good Potential of compliance: good    Pharmacist comments: Patient uses a pill box to help remember when to take medications and also has help of family member at home.      Darreld Mclean 04/25/2012 9:23 AM

## 2012-04-26 ENCOUNTER — Encounter (HOSPITAL_COMMUNITY): Payer: Medicare Other

## 2012-04-29 ENCOUNTER — Encounter (HOSPITAL_COMMUNITY): Payer: Medicare Other

## 2012-04-29 ENCOUNTER — Encounter (HOSPITAL_COMMUNITY)
Admission: RE | Admit: 2012-04-29 | Discharge: 2012-04-29 | Disposition: A | Discharge status: Home / Self Care | Payer: Medicare Other | Source: Ambulatory Visit | Attending: Cardiology | Admitting: Cardiology

## 2012-04-29 DIAGNOSIS — E785 Hyperlipidemia, unspecified: Secondary | ICD-10-CM | POA: Diagnosis not present

## 2012-04-29 DIAGNOSIS — I359 Nonrheumatic aortic valve disorder, unspecified: Secondary | ICD-10-CM | POA: Diagnosis not present

## 2012-04-29 DIAGNOSIS — Z954 Presence of other heart-valve replacement: Secondary | ICD-10-CM | POA: Diagnosis not present

## 2012-04-29 DIAGNOSIS — Z5189 Encounter for other specified aftercare: Secondary | ICD-10-CM | POA: Diagnosis not present

## 2012-04-29 DIAGNOSIS — I129 Hypertensive chronic kidney disease with stage 1 through stage 4 chronic kidney disease, or unspecified chronic kidney disease: Secondary | ICD-10-CM | POA: Diagnosis not present

## 2012-04-29 DIAGNOSIS — Z951 Presence of aortocoronary bypass graft: Secondary | ICD-10-CM | POA: Diagnosis not present

## 2012-04-29 DIAGNOSIS — I252 Old myocardial infarction: Secondary | ICD-10-CM | POA: Diagnosis not present

## 2012-04-29 DIAGNOSIS — N189 Chronic kidney disease, unspecified: Secondary | ICD-10-CM | POA: Diagnosis not present

## 2012-04-29 DIAGNOSIS — E119 Type 2 diabetes mellitus without complications: Secondary | ICD-10-CM | POA: Diagnosis not present

## 2012-04-29 LAB — GLUCOSE, CAPILLARY: Glucose-Capillary: 136 mg/dL — ABNORMAL HIGH (ref 70–99)

## 2012-04-29 NOTE — Progress Notes (Signed)
Pt started cardiac rehab today.  Pt tolerated light exercise without difficulty. Telemetry Sinus rhythm with ST depression previously documented. Vital signs stable.  Blood sugar 136. [name] [name] is not on diabetic medication. Will continue to monitor the patient throughout  the program.

## 2012-05-01 ENCOUNTER — Encounter (HOSPITAL_COMMUNITY): Payer: Medicare Other

## 2012-05-01 ENCOUNTER — Encounter (HOSPITAL_COMMUNITY)
Admission: RE | Admit: 2012-05-01 | Discharge: 2012-05-01 | Disposition: A | Discharge status: Home / Self Care | Payer: Medicare Other | Source: Ambulatory Visit | Attending: Cardiology | Admitting: Cardiology

## 2012-05-01 NOTE — Progress Notes (Signed)
[name] [name] 78 y.o. male       Nutrition Screen                                                                    YES  NO Do you live in a nursing home?  X   Do you eat out more than 3 times/week?    X If yes, how many times per week do you eat out?   Do you have food allergies?   X If yes, what are you allergic to?  Have you gained or lost more than 10 lbs without trying?               X If yes, how much weight have you lost and over what time period?  Do you want to lose weight?     X If yes, what is a goal weight or amount of weight you would like to lose?   Do you eat alone most of the time?   X   Do you eat less than 2 meals/day?  X If yes, how many meals do you eat?    Do you drink more than 3 alcohol drinks/day?  X If yes, how many drinks per day?   Are you having trouble with constipation? *  X If yes, what are you doing to help relieve constipation?  Do you have financial difficulties with buying food?*    X   Are you experiencing regular nausea/ vomiting?*     X   Do you have a poor appetite? *                                        X   Do you have trouble chewing/swallowing? *   X    Pt with diagnoses of:  X CABG              X AVR/MVR/ICD      X Dyslipidemia  / HDL< 40 / LDL>70 / High TG      X >81 years old  X %  Body fat >goal / Body Mass Index >25 X HTN / BP >120/80 X MI X CHF          X DM/A1c >6 / CBG >126       Pt Risk Score   0       Diagnosis Risk Score  135       Total Risk Score   135                        X High Risk                Low Risk    HT: 66.75" Ht Readings from Last 1 Encounters:  04/25/12 5' 6.75" (1.695 m)    WT:   145.2 lb (66.0 kg) Wt Readings from Last 3 Encounters:  04/25/12 145 lb 8.1 oz (66 kg)  04/23/12 146 lb (66.225 kg)  04/14/12 139 lb 8.8 oz (63.3 kg)     IBW 66.6 99%IBW BMI 23.0 23.2%body fat  Meds reviewed: MVI, Vitamin D Past Medical History  Diagnosis Date  . HTN (hypertension)   . History of colon cancer     . Aortic stenosis     Echocardiogram 12/13/11: EF 25-30%, posterolateral akinesis and diffuse hypokinesis, grade 2 diastolic dysfunction, moderate-severe AS, aortic valve area 1.03, mean gradient 16, mild-moderate MR, moderate LAE  . CAD (coronary artery disease)     s/p NSTEMI 12/2011:  LHC - Ostial left main 20%, ostial LAD 50%, mid 60-70%, ostial D1 40% and mid 40%, D2 70%, ostial circumflex occluded, ostial RCA 80-90%, LVEDP was 42;  plan eventually for CABG/AVR with Dr. Laneta Simmers  . Ischemic cardiomyopathy   . Chronic systolic heart failure   . CKD (chronic kidney disease)   . HLD (hyperlipidemia)   . DM2 (diabetes mellitus, type 2)   . Angina   . Heart murmur   . Cancer     colon resection  . Stroke 10/01  . GERD (gastroesophageal reflux disease)   . Chronic kidney disease   . Myocardial infarction   . Pleural effusion   . CHF (congestive heart failure)   . Diabetes mellitus     borderline  . Blood transfusion     NO REACTION TO TRANSFUSION       Activity level: Pt is active  Wt goal: 145.2 lb ( 66.0 kg) Current tobacco use? No Food/Drug Interaction? No Labs:  Lipid Panel     Component Value Date/Time   CHOL 124 12/14/2011 0400   TRIG 69 12/14/2011 0400   HDL 47 12/14/2011 0400   CHOLHDL 2.6 12/14/2011 0400   VLDL 14 12/14/2011 0400   LDLCALC 63 12/14/2011 0400   Lab Results  Component Value Date   HGBA1C 6.0* 02/06/2012   04/11/12 Glucose 140  LDL goal: < 70      MI, DM and > 2:      HTN, family h/o, > 76 yo male Estimated Daily Nutrition Needs for: ? wt maintenance 1950-2100 Kcal , Total Fat 65-70gm, Saturated Fat 15-16 gm, Trans Fat 2.1-2.3 gm,  Sodium less than 1500 mg, Grams of CHO 250

## 2012-05-03 ENCOUNTER — Encounter (HOSPITAL_COMMUNITY): Payer: Medicare Other

## 2012-05-03 ENCOUNTER — Encounter (HOSPITAL_COMMUNITY)
Admission: RE | Admit: 2012-05-03 | Discharge: 2012-05-03 | Disposition: A | Discharge status: Home / Self Care | Payer: Medicare Other | Source: Ambulatory Visit | Attending: Cardiology | Admitting: Cardiology

## 2012-05-06 ENCOUNTER — Encounter (HOSPITAL_COMMUNITY): Payer: Medicare Other

## 2012-05-08 ENCOUNTER — Encounter (HOSPITAL_COMMUNITY)
Admission: RE | Admit: 2012-05-08 | Discharge: 2012-05-08 | Disposition: A | Discharge status: Home / Self Care | Payer: Medicare Other | Source: Ambulatory Visit | Attending: Cardiology | Admitting: Cardiology

## 2012-05-08 ENCOUNTER — Encounter (HOSPITAL_COMMUNITY): Payer: Medicare Other

## 2012-05-10 ENCOUNTER — Encounter (HOSPITAL_COMMUNITY)
Admission: RE | Admit: 2012-05-10 | Discharge: 2012-05-10 | Disposition: A | Discharge status: Home / Self Care | Payer: Medicare Other | Source: Ambulatory Visit | Attending: Cardiology | Admitting: Cardiology

## 2012-05-10 ENCOUNTER — Encounter (HOSPITAL_COMMUNITY): Payer: Medicare Other

## 2012-05-13 ENCOUNTER — Encounter (HOSPITAL_COMMUNITY): Payer: Medicare Other

## 2012-05-13 ENCOUNTER — Encounter (HOSPITAL_COMMUNITY)
Admission: RE | Admit: 2012-05-13 | Discharge: 2012-05-13 | Disposition: A | Discharge status: Home / Self Care | Payer: Medicare Other | Source: Ambulatory Visit | Attending: Cardiology | Admitting: Cardiology

## 2012-05-13 DIAGNOSIS — N189 Chronic kidney disease, unspecified: Secondary | ICD-10-CM | POA: Insufficient documentation

## 2012-05-13 DIAGNOSIS — E785 Hyperlipidemia, unspecified: Secondary | ICD-10-CM | POA: Insufficient documentation

## 2012-05-13 DIAGNOSIS — I359 Nonrheumatic aortic valve disorder, unspecified: Secondary | ICD-10-CM | POA: Insufficient documentation

## 2012-05-13 DIAGNOSIS — Z954 Presence of other heart-valve replacement: Secondary | ICD-10-CM | POA: Diagnosis not present

## 2012-05-13 DIAGNOSIS — I129 Hypertensive chronic kidney disease with stage 1 through stage 4 chronic kidney disease, or unspecified chronic kidney disease: Secondary | ICD-10-CM | POA: Insufficient documentation

## 2012-05-13 DIAGNOSIS — I252 Old myocardial infarction: Secondary | ICD-10-CM | POA: Insufficient documentation

## 2012-05-13 DIAGNOSIS — Z951 Presence of aortocoronary bypass graft: Secondary | ICD-10-CM | POA: Insufficient documentation

## 2012-05-13 DIAGNOSIS — Z5189 Encounter for other specified aftercare: Secondary | ICD-10-CM | POA: Diagnosis not present

## 2012-05-13 DIAGNOSIS — E119 Type 2 diabetes mellitus without complications: Secondary | ICD-10-CM | POA: Diagnosis not present

## 2012-05-14 ENCOUNTER — Ambulatory Visit: Payer: Medicare Other | Admitting: Surgery

## 2012-05-15 ENCOUNTER — Encounter: Payer: Self-pay | Admitting: Surgery

## 2012-05-15 ENCOUNTER — Ambulatory Visit (INDEPENDENT_AMBULATORY_CARE_PROVIDER_SITE_OTHER): Payer: Medicare Other | Admitting: Surgery

## 2012-05-15 ENCOUNTER — Encounter (HOSPITAL_COMMUNITY): Payer: Medicare Other

## 2012-05-15 ENCOUNTER — Encounter (HOSPITAL_COMMUNITY)
Admission: RE | Admit: 2012-05-15 | Discharge: 2012-05-15 | Disposition: A | Discharge status: Home / Self Care | Payer: Medicare Other | Source: Ambulatory Visit | Attending: Cardiology | Admitting: Cardiology

## 2012-05-15 VITALS — BP 149/70 | HR 64 | Resp 18 | Ht 69.0 in | Wt 150.0 lb

## 2012-05-15 DIAGNOSIS — J90 Pleural effusion, not elsewhere classified: Secondary | ICD-10-CM

## 2012-05-15 DIAGNOSIS — Z952 Presence of prosthetic heart valve: Secondary | ICD-10-CM

## 2012-05-15 DIAGNOSIS — Z951 Presence of aortocoronary bypass graft: Secondary | ICD-10-CM

## 2012-05-15 DIAGNOSIS — Z954 Presence of other heart-valve replacement: Secondary | ICD-10-CM | POA: Diagnosis not present

## 2012-05-15 DIAGNOSIS — T8140XA Infection following a procedure, unspecified, initial encounter: Secondary | ICD-10-CM

## 2012-05-16 ENCOUNTER — Encounter: Payer: Self-pay | Admitting: Surgery

## 2012-05-16 NOTE — Progress Notes (Signed)
HPI: The patient returns today for followup of his right leg saphenous vein harvest wound infection. He said that it closed up last week and he is no longer doing dressing changes to it. He denies any fever or chills. He feels well otherwise.  Current Outpatient Prescriptions  Medication Sig Dispense Refill  . aspirin EC 325 MG EC tablet Take 1 tablet (325 mg total) by mouth daily.  30 tablet    . carvedilol (COREG) 25 MG tablet Take 1 tablet (25 mg total) by mouth 2 (two) times daily with a meal.  60 tablet  1  . cholecalciferol (VITAMIN D) 1000 UNITS tablet Take 1,000 Units by mouth daily.       . furosemide (LASIX) 40 MG tablet Take 1 tablet (40 mg total) by mouth daily.  14 tablet  0  . metipranolol (OPTIPRANOLOL) 0.3 % ophthalmic solution Place 1 drop into both eyes 2 (two) times daily.       . Multiple Vitamin (MULTIVITAMIN) capsule Take 1 capsule by mouth daily.        Marland Kitchen omeprazole (PRILOSEC) 20 MG capsule Take 20 mg by mouth daily.        . simvastatin (ZOCOR) 10 MG tablet Take 10 mg by mouth at bedtime.        . Tamsulosin HCl (FLOMAX) 0.4 MG CAPS Take 1 capsule (0.4 mg total) by mouth daily after supper.  30 capsule  1  . DISCONTD: metFORMIN (GLUCOPHAGE) 500 MG tablet Take 500-1,000 mg by mouth. 2 tabs in the am, 1 tab in the pm      . DISCONTD: potassium chloride SA (K-DUR,KLOR-CON) 20 MEQ tablet Take 1 tablet (20 mEq total) by mouth daily.  14 tablet  3     Physical Exam: BP 149/70  Pulse 64  Resp 18  Ht 5\' 9"  (1.753 m)  Wt 150 lb (68.04 kg)  BMI 22.15 kg/m2  SpO2 97% He looks well. The right leg saphenous vein harvest wound has completely healed. There is no sign of infection.  There is mild edema in both lower legs. Lungs: Clear  Diagnostic Tests: None  Impression: The right leg vein harvest wound has completely healed. I would not expect him to have any more trouble with that.  Plan: I told him he did not need to return to see me unless he developed any further  problems with his incisions. He will continue followup with cardiology.

## 2012-05-17 ENCOUNTER — Encounter (HOSPITAL_COMMUNITY): Payer: Medicare Other

## 2012-05-17 ENCOUNTER — Encounter (HOSPITAL_COMMUNITY)
Admission: RE | Admit: 2012-05-17 | Discharge: 2012-05-17 | Disposition: A | Discharge status: Home / Self Care | Payer: Medicare Other | Source: Ambulatory Visit | Attending: Cardiology | Admitting: Cardiology

## 2012-05-20 ENCOUNTER — Encounter (HOSPITAL_COMMUNITY)
Admission: RE | Admit: 2012-05-20 | Discharge: 2012-05-20 | Disposition: A | Discharge status: Home / Self Care | Payer: Medicare Other | Source: Ambulatory Visit | Attending: Cardiology | Admitting: Cardiology

## 2012-05-20 ENCOUNTER — Encounter (HOSPITAL_COMMUNITY): Payer: Medicare Other

## 2012-05-21 DIAGNOSIS — I10 Essential (primary) hypertension: Secondary | ICD-10-CM | POA: Diagnosis not present

## 2012-05-21 DIAGNOSIS — E1159 Type 2 diabetes mellitus with other circulatory complications: Secondary | ICD-10-CM | POA: Diagnosis not present

## 2012-05-22 ENCOUNTER — Encounter (HOSPITAL_COMMUNITY): Payer: Medicare Other

## 2012-05-22 ENCOUNTER — Encounter (HOSPITAL_COMMUNITY)
Admission: RE | Admit: 2012-05-22 | Discharge: 2012-05-22 | Disposition: A | Discharge status: Home / Self Care | Payer: Medicare Other | Source: Ambulatory Visit | Attending: Cardiology | Admitting: Cardiology

## 2012-05-23 DIAGNOSIS — I2510 Atherosclerotic heart disease of native coronary artery without angina pectoris: Secondary | ICD-10-CM | POA: Diagnosis not present

## 2012-05-23 DIAGNOSIS — I359 Nonrheumatic aortic valve disorder, unspecified: Secondary | ICD-10-CM | POA: Diagnosis not present

## 2012-05-23 DIAGNOSIS — I10 Essential (primary) hypertension: Secondary | ICD-10-CM | POA: Diagnosis not present

## 2012-05-23 DIAGNOSIS — E1159 Type 2 diabetes mellitus with other circulatory complications: Secondary | ICD-10-CM | POA: Diagnosis not present

## 2012-05-24 ENCOUNTER — Encounter (HOSPITAL_COMMUNITY)
Admission: RE | Admit: 2012-05-24 | Discharge: 2012-05-24 | Disposition: A | Discharge status: Home / Self Care | Payer: Medicare Other | Source: Ambulatory Visit

## 2012-05-24 ENCOUNTER — Encounter (HOSPITAL_COMMUNITY): Payer: Medicare Other

## 2012-05-27 ENCOUNTER — Encounter (HOSPITAL_COMMUNITY)
Admission: RE | Admit: 2012-05-27 | Discharge: 2012-05-27 | Disposition: A | Discharge status: Home / Self Care | Payer: Medicare Other | Source: Ambulatory Visit | Attending: Cardiology | Admitting: Cardiology

## 2012-05-27 ENCOUNTER — Encounter (HOSPITAL_COMMUNITY): Payer: Medicare Other

## 2012-05-29 ENCOUNTER — Encounter (HOSPITAL_COMMUNITY)
Admission: RE | Admit: 2012-05-29 | Discharge: 2012-05-29 | Disposition: A | Discharge status: Home / Self Care | Payer: Medicare Other | Source: Ambulatory Visit | Attending: Cardiology | Admitting: Cardiology

## 2012-05-29 ENCOUNTER — Encounter (HOSPITAL_COMMUNITY): Payer: Medicare Other

## 2012-05-29 ENCOUNTER — Ambulatory Visit (INDEPENDENT_AMBULATORY_CARE_PROVIDER_SITE_OTHER): Payer: Medicare Other | Admitting: Cardiology

## 2012-05-29 ENCOUNTER — Encounter: Payer: Self-pay | Admitting: Cardiology

## 2012-05-29 VITALS — BP 130/64 | HR 67 | Ht 69.0 in | Wt 149.0 lb

## 2012-05-29 DIAGNOSIS — E785 Hyperlipidemia, unspecified: Secondary | ICD-10-CM

## 2012-05-29 DIAGNOSIS — I5022 Chronic systolic (congestive) heart failure: Secondary | ICD-10-CM | POA: Diagnosis not present

## 2012-05-29 DIAGNOSIS — N189 Chronic kidney disease, unspecified: Secondary | ICD-10-CM

## 2012-05-29 DIAGNOSIS — I2589 Other forms of chronic ischemic heart disease: Secondary | ICD-10-CM | POA: Diagnosis not present

## 2012-05-29 DIAGNOSIS — I10 Essential (primary) hypertension: Secondary | ICD-10-CM

## 2012-05-29 DIAGNOSIS — I255 Ischemic cardiomyopathy: Secondary | ICD-10-CM

## 2012-05-29 DIAGNOSIS — I359 Nonrheumatic aortic valve disorder, unspecified: Secondary | ICD-10-CM | POA: Diagnosis not present

## 2012-05-29 NOTE — Assessment & Plan Note (Signed)
Status post aortic valve replacement with progressive improvement in stamina and less shortness of breath.

## 2012-05-29 NOTE — Assessment & Plan Note (Signed)
Status post coronary bypass grafting. Stable. Improved functional capacity postop.

## 2012-05-29 NOTE — Assessment & Plan Note (Signed)
Stable. Continue medical therapy 

## 2012-05-29 NOTE — Patient Instructions (Addendum)
Your physician wants you to follow-up in: 6 months with Dr. Wall.  You will receive a reminder letter in the mail two months in advance. If you don't receive a letter, please call our office to schedule the follow-up appointment.  

## 2012-05-29 NOTE — Progress Notes (Signed)
HPI  [name] returns for close followup after having coronary bypass grafting and aortic valve are placement about 6 months ago. Please see my previous note.  He continues to progressively improve. He denies any orthopnea, PND but has had some edema in his right lower extremity which is where his grafts were harvested. His stamina has improved and his shortness of breath on exertion has improved. He denies any chest pain. He is nodding had incisional chest pain.  He is very compliant with his medications. His weight is improved. He is now over the cellulitis of his right lower extremity. Past Medical History  Diagnosis Date  . HTN (hypertension)   . History of colon cancer   . Aortic stenosis     Echocardiogram 12/13/11: EF 25-30%, posterolateral akinesis and diffuse hypokinesis, grade 2 diastolic dysfunction, moderate-severe AS, aortic valve area 1.03, mean gradient 16, mild-moderate MR, moderate LAE  . CAD (coronary artery disease)     s/p NSTEMI 12/2011:  LHC - Ostial left main 20%, ostial LAD 50%, mid 60-70%, ostial D1 40% and mid 40%, D2 70%, ostial circumflex occluded, ostial RCA 80-90%, LVEDP was 42;  plan eventually for CABG/AVR with Dr. Laneta Simmers  . Ischemic cardiomyopathy   . Chronic systolic heart failure   . CKD (chronic kidney disease)   . HLD (hyperlipidemia)   . DM2 (diabetes mellitus, type 2)   . Angina   . Heart murmur   . Cancer     colon resection  . Stroke 10/01  . GERD (gastroesophageal reflux disease)   . Chronic kidney disease   . Myocardial infarction   . Pleural effusion   . CHF (congestive heart failure)   . Diabetes mellitus     borderline  . Blood transfusion     NO REACTION TO TRANSFUSION    Current Outpatient Prescriptions  Medication Sig Dispense Refill  . aspirin EC 325 MG EC tablet Take 1 tablet (325 mg total) by mouth daily.  30 tablet    . carvedilol (COREG) 25 MG tablet Take 1 tablet (25 mg total) by mouth 2 (two) times daily with a meal.  60  tablet  1  . cholecalciferol (VITAMIN D) 1000 UNITS tablet Take 1,000 Units by mouth daily.       . furosemide (LASIX) 40 MG tablet Take 1 tablet (40 mg total) by mouth daily.  14 tablet  0  . metipranolol (OPTIPRANOLOL) 0.3 % ophthalmic solution Place 1 drop into both eyes 2 (two) times daily.       . Multiple Vitamin (MULTIVITAMIN) capsule Take 1 capsule by mouth daily.        . nitroGLYCERIN (NITROSTAT) 0.4 MG SL tablet Place 0.4 mg under the tongue every 5 (five) minutes as needed.      Marland Kitchen omeprazole (PRILOSEC) 20 MG capsule Take 20 mg by mouth daily.        . simvastatin (ZOCOR) 10 MG tablet Take 10 mg by mouth at bedtime.        . Tamsulosin HCl (FLOMAX) 0.4 MG CAPS Take 1 capsule (0.4 mg total) by mouth daily after supper.  30 capsule  1  . DISCONTD: metFORMIN (GLUCOPHAGE) 500 MG tablet Take 500-1,000 mg by mouth. 2 tabs in the am, 1 tab in the pm      . DISCONTD: potassium chloride SA (K-DUR,KLOR-CON) 20 MEQ tablet Take 1 tablet (20 mEq total) by mouth daily.  14 tablet  3    No Known Allergies  Family History  Problem Relation Age of Onset  . Cancer    . Heart attack    . Diabetes    . Heart failure    . Cancer Mother   . Heart attack Father     History   Social History  . Marital Status: Married    Spouse Name: N/A    Number of Children: N/A  . Years of Education: N/A   Occupational History  . Not on file.   Social History Main Topics  . Smoking status: Never Smoker   . Smokeless tobacco: Never Used  . Alcohol Use: No  . Drug Use: No  . Sexually Active: Not Currently   Other Topics Concern  . Not on file   Social History Narrative  . No narrative on file    ROS ALL NEGATIVE EXCEPT THOSE NOTED IN HPI  PE  General Appearance: well developed, well nourished in no acute distress, looks much stronger HEENT: symmetrical face, PERRLA, good dentition  Neck: no JVD, thyromegaly, or adenopathy, trachea midline Chest: symmetric without deformity Cardiac: PMI  non-displaced, RRR, normal S1, S2, no gallop, soft systolic murmur S2 splits no diastolic component Lung: clear to ausculation and percussion Vascular: all pulses full without bruits  Abdominal: nondistended, nontender, good bowel sounds, no HSM, no bruits Extremities: no cyanosis, clubbing, 1+ pitting edema the right lower extremity Feet are warm with no signs of ulceration or lesions. no sign of DVT, no varicosities  Skin: normal color, no rashes Neuro: alert and oriented x 3, non-focal Pysch: normal affect  EKG  BMET    Component Value Date/Time   NA 137 04/11/2012 0635   K 4.4 04/11/2012 0635   CL 101 04/11/2012 0635   CO2 25 04/11/2012 0635   GLUCOSE 140* 04/11/2012 0635   BUN 39* 04/11/2012 0635   CREATININE 2.11* 04/11/2012 0635   CALCIUM 9.2 04/11/2012 0635   GFRNONAA 27* 04/11/2012 0635   GFRAA 31* 04/11/2012 0635    Lipid Panel     Component Value Date/Time   CHOL 124 12/14/2011 0400   TRIG 69 12/14/2011 0400   HDL 47 12/14/2011 0400   CHOLHDL 2.6 12/14/2011 0400   VLDL 14 12/14/2011 0400   LDLCALC 63 12/14/2011 0400    CBC    Component Value Date/Time   WBC 5.9 04/15/2012 0445   RBC 3.02* 04/15/2012 0445   HGB 9.3* 04/15/2012 0445   HCT 27.9* 04/15/2012 0445   PLT 171 04/15/2012 0445   MCV 92.4 04/15/2012 0445   MCH 30.8 04/15/2012 0445   MCHC 33.3 04/15/2012 0445   RDW 12.3 04/15/2012 0445

## 2012-05-31 ENCOUNTER — Encounter (HOSPITAL_COMMUNITY): Payer: Medicare Other

## 2012-06-03 ENCOUNTER — Encounter (HOSPITAL_COMMUNITY): Payer: Medicare Other

## 2012-06-03 ENCOUNTER — Encounter (HOSPITAL_COMMUNITY)
Admission: RE | Admit: 2012-06-03 | Discharge: 2012-06-03 | Disposition: A | Discharge status: Home / Self Care | Payer: Medicare Other | Source: Ambulatory Visit | Attending: Cardiology | Admitting: Cardiology

## 2012-06-03 ENCOUNTER — Other Ambulatory Visit: Payer: Self-pay | Admitting: Physician Assistant

## 2012-06-03 ENCOUNTER — Other Ambulatory Visit: Payer: Self-pay | Admitting: Cardiology

## 2012-06-04 DIAGNOSIS — H4011X0 Primary open-angle glaucoma, stage unspecified: Secondary | ICD-10-CM | POA: Diagnosis not present

## 2012-06-04 DIAGNOSIS — H409 Unspecified glaucoma: Secondary | ICD-10-CM | POA: Diagnosis not present

## 2012-06-04 DIAGNOSIS — H2510 Age-related nuclear cataract, unspecified eye: Secondary | ICD-10-CM | POA: Diagnosis not present

## 2012-06-05 ENCOUNTER — Encounter (HOSPITAL_COMMUNITY): Payer: Medicare Other

## 2012-06-05 ENCOUNTER — Encounter (HOSPITAL_COMMUNITY)
Admission: RE | Admit: 2012-06-05 | Discharge: 2012-06-05 | Disposition: A | Discharge status: Home / Self Care | Payer: Medicare Other | Source: Ambulatory Visit | Attending: Cardiology | Admitting: Cardiology

## 2012-06-05 NOTE — Progress Notes (Signed)
[name] [name] 78 y.o. male Nutrition Note Spoke with pt.  Nutrition Plan, Nutrition Survey, and cholesterol goals reviewed with pt. Pt is following Step 2 of the Therapeutic Lifestyle Changes diet. Pt is watching his sodium intake due to CHF. Pt choosing mostly fresh or frozen vegetables and rarely adds salt to food. Age-appropriate nutrition recommendations discussed. Pt is a diet-controlled diabetic. Pt last A1c was 6.0, which indicates tight glucose control. This Clinical research associate went over Diabetes Education test results. Pt expressed understanding of information reviewed.  Nutrition Diagnosis   Food-and nutrition-related knowledge deficit related to lack of exposure to information as related to diagnosis of: ? CVD ? DM (A1c 6.0)  Nutrition RX/ Estimated Daily Nutrition Needs for: wt maintenance 1950-2100 Kcal, 65-70 gm fat, 15-16 gm sat fat, 2.1-2.3 gm trans-fat, <1500 mg sodium , 250 gm CHO   Nutrition Intervention   Pt's individual nutrition plan including cholesterol goals reviewed with pt.   Benefits of adopting Therapeutic Lifestyle Changes discussed when Medficts reviewed.   Pt to attend the Portion Distortion class - met 06/03/12   Pt to attend the  ? Nutrition I class                         ? Nutrition II class        ? Diabetes Blitz class        ? Diabetes Q & A class   Pt given handouts for: ? wt loss ? DM ? low sodium    Continue client-centered nutrition education by RD, as part of interdisciplinary care. Goal(s)   Pt to describe the benefit of including fruits, vegetables, whole grains, and low-fat dairy products in a heart healthy meal plan. Monitor and Evaluate progress toward nutrition goal with team.  Nutrition Risk: change to Moderate Risk

## 2012-06-07 ENCOUNTER — Encounter (HOSPITAL_COMMUNITY): Payer: Medicare Other

## 2012-06-07 ENCOUNTER — Encounter (HOSPITAL_COMMUNITY)
Admission: RE | Admit: 2012-06-07 | Discharge: 2012-06-07 | Disposition: A | Discharge status: Home / Self Care | Payer: Medicare Other | Source: Ambulatory Visit | Attending: Cardiology | Admitting: Cardiology

## 2012-06-10 ENCOUNTER — Encounter (HOSPITAL_COMMUNITY): Payer: Medicare Other

## 2012-06-10 ENCOUNTER — Encounter (HOSPITAL_COMMUNITY)
Admission: RE | Admit: 2012-06-10 | Discharge: 2012-06-10 | Disposition: A | Discharge status: Home / Self Care | Payer: Medicare Other | Source: Ambulatory Visit | Attending: Cardiology | Admitting: Cardiology

## 2012-06-10 DIAGNOSIS — I359 Nonrheumatic aortic valve disorder, unspecified: Secondary | ICD-10-CM | POA: Diagnosis not present

## 2012-06-10 DIAGNOSIS — N189 Chronic kidney disease, unspecified: Secondary | ICD-10-CM | POA: Insufficient documentation

## 2012-06-10 DIAGNOSIS — Z954 Presence of other heart-valve replacement: Secondary | ICD-10-CM | POA: Diagnosis not present

## 2012-06-10 DIAGNOSIS — Z951 Presence of aortocoronary bypass graft: Secondary | ICD-10-CM | POA: Diagnosis not present

## 2012-06-10 DIAGNOSIS — I252 Old myocardial infarction: Secondary | ICD-10-CM | POA: Diagnosis not present

## 2012-06-10 DIAGNOSIS — E785 Hyperlipidemia, unspecified: Secondary | ICD-10-CM | POA: Insufficient documentation

## 2012-06-10 DIAGNOSIS — Z5189 Encounter for other specified aftercare: Secondary | ICD-10-CM | POA: Diagnosis not present

## 2012-06-10 DIAGNOSIS — I129 Hypertensive chronic kidney disease with stage 1 through stage 4 chronic kidney disease, or unspecified chronic kidney disease: Secondary | ICD-10-CM | POA: Diagnosis not present

## 2012-06-10 DIAGNOSIS — E119 Type 2 diabetes mellitus without complications: Secondary | ICD-10-CM | POA: Diagnosis not present

## 2012-06-12 ENCOUNTER — Encounter (HOSPITAL_COMMUNITY): Payer: Medicare Other

## 2012-06-12 ENCOUNTER — Encounter (HOSPITAL_COMMUNITY)
Admission: RE | Admit: 2012-06-12 | Discharge: 2012-06-12 | Disposition: A | Discharge status: Home / Self Care | Payer: Medicare Other | Source: Ambulatory Visit | Attending: Cardiology | Admitting: Cardiology

## 2012-06-14 ENCOUNTER — Encounter (HOSPITAL_COMMUNITY): Payer: Medicare Other

## 2012-06-14 ENCOUNTER — Other Ambulatory Visit: Payer: Self-pay | Admitting: Physician Assistant

## 2012-06-14 ENCOUNTER — Encounter (HOSPITAL_COMMUNITY)
Admission: RE | Admit: 2012-06-14 | Discharge: 2012-06-14 | Disposition: A | Discharge status: Home / Self Care | Payer: Medicare Other | Source: Ambulatory Visit | Attending: Cardiology | Admitting: Cardiology

## 2012-06-14 ENCOUNTER — Other Ambulatory Visit: Payer: Self-pay | Admitting: Cardiology

## 2012-06-17 ENCOUNTER — Encounter (HOSPITAL_COMMUNITY): Payer: Medicare Other

## 2012-06-17 ENCOUNTER — Encounter (HOSPITAL_COMMUNITY)
Admission: RE | Admit: 2012-06-17 | Discharge: 2012-06-17 | Disposition: A | Discharge status: Home / Self Care | Payer: Medicare Other | Source: Ambulatory Visit | Attending: Cardiology | Admitting: Cardiology

## 2012-06-19 ENCOUNTER — Encounter (HOSPITAL_COMMUNITY): Payer: Medicare Other

## 2012-06-19 ENCOUNTER — Encounter (HOSPITAL_COMMUNITY)
Admission: RE | Admit: 2012-06-19 | Discharge: 2012-06-19 | Disposition: A | Discharge status: Home / Self Care | Payer: Medicare Other | Source: Ambulatory Visit | Attending: Cardiology | Admitting: Cardiology

## 2012-06-21 ENCOUNTER — Encounter (HOSPITAL_COMMUNITY): Payer: Medicare Other

## 2012-06-21 ENCOUNTER — Encounter (HOSPITAL_COMMUNITY)
Admission: RE | Admit: 2012-06-21 | Discharge: 2012-06-21 | Disposition: A | Discharge status: Home / Self Care | Payer: Medicare Other | Source: Ambulatory Visit | Attending: Cardiology | Admitting: Cardiology

## 2012-06-21 LAB — GLUCOSE, CAPILLARY: Glucose-Capillary: 106 mg/dL — ABNORMAL HIGH (ref 70–99)

## 2012-06-21 NOTE — Progress Notes (Signed)
Resting and exertional blood pressure have been elevated recently exit blood pressure 152/60 today.  [name] [name] is taking his medications as prescribed. [name] [name] is without complaints. Will fax exercise flow sheets to Dr. Vern Claude office for review.

## 2012-06-24 ENCOUNTER — Encounter (HOSPITAL_COMMUNITY)
Admission: RE | Admit: 2012-06-24 | Discharge: 2012-06-24 | Disposition: A | Discharge status: Home / Self Care | Payer: Medicare Other | Source: Ambulatory Visit | Attending: Cardiology | Admitting: Cardiology

## 2012-06-24 ENCOUNTER — Encounter (HOSPITAL_COMMUNITY): Payer: Medicare Other

## 2012-06-26 ENCOUNTER — Encounter (HOSPITAL_COMMUNITY)
Admission: RE | Admit: 2012-06-26 | Discharge: 2012-06-26 | Disposition: A | Discharge status: Home / Self Care | Payer: Medicare Other | Source: Ambulatory Visit | Attending: Cardiology | Admitting: Cardiology

## 2012-06-26 ENCOUNTER — Encounter (HOSPITAL_COMMUNITY): Payer: Medicare Other

## 2012-06-28 ENCOUNTER — Encounter (HOSPITAL_COMMUNITY): Payer: Medicare Other

## 2012-06-28 ENCOUNTER — Encounter (HOSPITAL_COMMUNITY)
Admission: RE | Admit: 2012-06-28 | Discharge: 2012-06-28 | Disposition: A | Discharge status: Home / Self Care | Payer: Medicare Other | Source: Ambulatory Visit | Attending: Cardiology | Admitting: Cardiology

## 2012-07-01 ENCOUNTER — Encounter (HOSPITAL_COMMUNITY): Payer: Medicare Other

## 2012-07-01 ENCOUNTER — Encounter (HOSPITAL_COMMUNITY)
Admission: RE | Admit: 2012-07-01 | Discharge: 2012-07-01 | Disposition: A | Discharge status: Home / Self Care | Payer: Medicare Other | Source: Ambulatory Visit | Attending: Cardiology | Admitting: Cardiology

## 2012-07-03 ENCOUNTER — Encounter (HOSPITAL_COMMUNITY): Payer: Medicare Other

## 2012-07-03 ENCOUNTER — Encounter (HOSPITAL_COMMUNITY)
Admission: RE | Admit: 2012-07-03 | Discharge: 2012-07-03 | Disposition: A | Discharge status: Home / Self Care | Payer: Medicare Other | Source: Ambulatory Visit | Attending: Cardiology | Admitting: Cardiology

## 2012-07-05 ENCOUNTER — Encounter (HOSPITAL_COMMUNITY)
Admission: RE | Admit: 2012-07-05 | Discharge: 2012-07-05 | Disposition: A | Discharge status: Home / Self Care | Payer: Medicare Other | Source: Ambulatory Visit | Attending: Cardiology | Admitting: Cardiology

## 2012-07-05 ENCOUNTER — Encounter (HOSPITAL_COMMUNITY): Payer: Medicare Other

## 2012-07-08 ENCOUNTER — Encounter (HOSPITAL_COMMUNITY)
Admission: RE | Admit: 2012-07-08 | Discharge: 2012-07-08 | Disposition: A | Discharge status: Home / Self Care | Payer: Medicare Other | Source: Ambulatory Visit | Attending: Cardiology | Admitting: Cardiology

## 2012-07-08 ENCOUNTER — Encounter (HOSPITAL_COMMUNITY): Payer: Medicare Other

## 2012-07-09 DIAGNOSIS — H409 Unspecified glaucoma: Secondary | ICD-10-CM | POA: Diagnosis not present

## 2012-07-09 DIAGNOSIS — H4011X0 Primary open-angle glaucoma, stage unspecified: Secondary | ICD-10-CM | POA: Diagnosis not present

## 2012-07-09 NOTE — Progress Notes (Signed)
[name] [name] graduates next week. [name] [name] feels he needs more "therapy' for his leg once he finishes cardiac rehab. [name] [name] does appear to limp after walking the track for an extended period of time.  Upon inspection patient has bilateral lower extremity edema. SAO2 95% on room air.  [name] [name] denies shortness of breath.  Lung fields clear upon ausculation. [name] [name]'s weight is up 1.6 kg from the 07/05/12 exercise session. Dr Renato Gails patients primary physician called and notified of patients concerns and weight gain. Dr Renato Gails said she will see [name] [name] in the office this week.  Appointment made for [name] [name] to see Dr Renato Gails on Thursday. Will fax exercise flow sheets to Dr. Ernest Mallick office for review.

## 2012-07-10 ENCOUNTER — Encounter (HOSPITAL_COMMUNITY)
Admission: RE | Admit: 2012-07-10 | Discharge: 2012-07-10 | Disposition: A | Discharge status: Home / Self Care | Payer: Medicare Other | Source: Ambulatory Visit | Attending: Cardiology | Admitting: Cardiology

## 2012-07-10 ENCOUNTER — Encounter (HOSPITAL_COMMUNITY): Payer: Medicare Other

## 2012-07-11 ENCOUNTER — Other Ambulatory Visit: Payer: Self-pay | Admitting: Physician Assistant

## 2012-07-11 ENCOUNTER — Other Ambulatory Visit: Payer: Self-pay | Admitting: *Deleted

## 2012-07-11 ENCOUNTER — Other Ambulatory Visit: Payer: Self-pay | Admitting: Internal Medicine

## 2012-07-11 DIAGNOSIS — G8190 Hemiplegia, unspecified affecting unspecified side: Secondary | ICD-10-CM

## 2012-07-12 ENCOUNTER — Encounter (HOSPITAL_COMMUNITY)
Admission: RE | Admit: 2012-07-12 | Discharge: 2012-07-12 | Disposition: A | Discharge status: Home / Self Care | Payer: Medicare Other | Source: Ambulatory Visit | Attending: Cardiology | Admitting: Cardiology

## 2012-07-12 ENCOUNTER — Ambulatory Visit
Admission: RE | Admit: 2012-07-12 | Discharge: 2012-07-12 | Disposition: A | Discharge status: Home / Self Care | Payer: Medicare Other | Source: Ambulatory Visit | Attending: Internal Medicine | Admitting: Internal Medicine

## 2012-07-12 ENCOUNTER — Encounter (HOSPITAL_COMMUNITY): Payer: Medicare Other

## 2012-07-12 DIAGNOSIS — E785 Hyperlipidemia, unspecified: Secondary | ICD-10-CM | POA: Diagnosis not present

## 2012-07-12 DIAGNOSIS — Z954 Presence of other heart-valve replacement: Secondary | ICD-10-CM | POA: Insufficient documentation

## 2012-07-12 DIAGNOSIS — I359 Nonrheumatic aortic valve disorder, unspecified: Secondary | ICD-10-CM | POA: Insufficient documentation

## 2012-07-12 DIAGNOSIS — I129 Hypertensive chronic kidney disease with stage 1 through stage 4 chronic kidney disease, or unspecified chronic kidney disease: Secondary | ICD-10-CM | POA: Diagnosis not present

## 2012-07-12 DIAGNOSIS — Z951 Presence of aortocoronary bypass graft: Secondary | ICD-10-CM | POA: Insufficient documentation

## 2012-07-12 DIAGNOSIS — N189 Chronic kidney disease, unspecified: Secondary | ICD-10-CM | POA: Diagnosis not present

## 2012-07-12 DIAGNOSIS — E119 Type 2 diabetes mellitus without complications: Secondary | ICD-10-CM | POA: Insufficient documentation

## 2012-07-12 DIAGNOSIS — Z5189 Encounter for other specified aftercare: Secondary | ICD-10-CM | POA: Insufficient documentation

## 2012-07-12 DIAGNOSIS — G319 Degenerative disease of nervous system, unspecified: Secondary | ICD-10-CM | POA: Diagnosis not present

## 2012-07-12 DIAGNOSIS — G8190 Hemiplegia, unspecified affecting unspecified side: Secondary | ICD-10-CM

## 2012-07-12 DIAGNOSIS — I252 Old myocardial infarction: Secondary | ICD-10-CM | POA: Diagnosis not present

## 2012-07-15 ENCOUNTER — Encounter (HOSPITAL_COMMUNITY)
Admission: RE | Admit: 2012-07-15 | Discharge: 2012-07-15 | Disposition: A | Discharge status: Home / Self Care | Payer: Medicare Other | Source: Ambulatory Visit | Attending: Cardiology | Admitting: Cardiology

## 2012-07-18 ENCOUNTER — Other Ambulatory Visit: Payer: Self-pay | Admitting: Cardiology

## 2012-07-23 ENCOUNTER — Encounter (HOSPITAL_COMMUNITY): Payer: Self-pay | Admitting: *Deleted

## 2012-07-23 ENCOUNTER — Inpatient Hospital Stay (HOSPITAL_COMMUNITY)
Admission: EM | Admit: 2012-07-23 | Discharge: 2012-07-26 | DRG: 292 | Disposition: A | Discharge status: Home / Self Care | Payer: Medicare Other | Attending: Cardiology | Admitting: Cardiology

## 2012-07-23 ENCOUNTER — Emergency Department (HOSPITAL_COMMUNITY): Payer: Medicare Other

## 2012-07-23 DIAGNOSIS — Z954 Presence of other heart-valve replacement: Secondary | ICD-10-CM

## 2012-07-23 DIAGNOSIS — J9819 Other pulmonary collapse: Secondary | ICD-10-CM | POA: Diagnosis not present

## 2012-07-23 DIAGNOSIS — I2589 Other forms of chronic ischemic heart disease: Secondary | ICD-10-CM | POA: Diagnosis present

## 2012-07-23 DIAGNOSIS — N179 Acute kidney failure, unspecified: Secondary | ICD-10-CM | POA: Diagnosis present

## 2012-07-23 DIAGNOSIS — Z951 Presence of aortocoronary bypass graft: Secondary | ICD-10-CM | POA: Diagnosis not present

## 2012-07-23 DIAGNOSIS — Z79899 Other long term (current) drug therapy: Secondary | ICD-10-CM

## 2012-07-23 DIAGNOSIS — R091 Pleurisy: Secondary | ICD-10-CM | POA: Diagnosis not present

## 2012-07-23 DIAGNOSIS — M7989 Other specified soft tissue disorders: Secondary | ICD-10-CM | POA: Diagnosis not present

## 2012-07-23 DIAGNOSIS — I129 Hypertensive chronic kidney disease with stage 1 through stage 4 chronic kidney disease, or unspecified chronic kidney disease: Secondary | ICD-10-CM | POA: Diagnosis present

## 2012-07-23 DIAGNOSIS — I252 Old myocardial infarction: Secondary | ICD-10-CM | POA: Diagnosis not present

## 2012-07-23 DIAGNOSIS — E119 Type 2 diabetes mellitus without complications: Secondary | ICD-10-CM | POA: Diagnosis present

## 2012-07-23 DIAGNOSIS — K219 Gastro-esophageal reflux disease without esophagitis: Secondary | ICD-10-CM | POA: Diagnosis present

## 2012-07-23 DIAGNOSIS — Z8673 Personal history of transient ischemic attack (TIA), and cerebral infarction without residual deficits: Secondary | ICD-10-CM | POA: Diagnosis not present

## 2012-07-23 DIAGNOSIS — I2510 Atherosclerotic heart disease of native coronary artery without angina pectoris: Secondary | ICD-10-CM | POA: Diagnosis present

## 2012-07-23 DIAGNOSIS — L02419 Cutaneous abscess of limb, unspecified: Secondary | ICD-10-CM | POA: Diagnosis present

## 2012-07-23 DIAGNOSIS — I25110 Atherosclerotic heart disease of native coronary artery with unstable angina pectoris: Secondary | ICD-10-CM | POA: Diagnosis present

## 2012-07-23 DIAGNOSIS — Z85038 Personal history of other malignant neoplasm of large intestine: Secondary | ICD-10-CM | POA: Diagnosis not present

## 2012-07-23 DIAGNOSIS — I119 Hypertensive heart disease without heart failure: Secondary | ICD-10-CM | POA: Diagnosis present

## 2012-07-23 DIAGNOSIS — D649 Anemia, unspecified: Secondary | ICD-10-CM | POA: Diagnosis present

## 2012-07-23 DIAGNOSIS — I5023 Acute on chronic systolic (congestive) heart failure: Secondary | ICD-10-CM | POA: Diagnosis not present

## 2012-07-23 DIAGNOSIS — E785 Hyperlipidemia, unspecified: Secondary | ICD-10-CM | POA: Diagnosis present

## 2012-07-23 DIAGNOSIS — I509 Heart failure, unspecified: Secondary | ICD-10-CM | POA: Diagnosis not present

## 2012-07-23 DIAGNOSIS — R0602 Shortness of breath: Secondary | ICD-10-CM | POA: Diagnosis not present

## 2012-07-23 DIAGNOSIS — D631 Anemia in chronic kidney disease: Secondary | ICD-10-CM | POA: Diagnosis present

## 2012-07-23 DIAGNOSIS — Z7982 Long term (current) use of aspirin: Secondary | ICD-10-CM | POA: Diagnosis not present

## 2012-07-23 DIAGNOSIS — I5022 Chronic systolic (congestive) heart failure: Secondary | ICD-10-CM | POA: Diagnosis not present

## 2012-07-23 DIAGNOSIS — J90 Pleural effusion, not elsewhere classified: Secondary | ICD-10-CM | POA: Diagnosis present

## 2012-07-23 DIAGNOSIS — R6889 Other general symptoms and signs: Secondary | ICD-10-CM | POA: Diagnosis not present

## 2012-07-23 DIAGNOSIS — N184 Chronic kidney disease, stage 4 (severe): Secondary | ICD-10-CM | POA: Diagnosis present

## 2012-07-23 DIAGNOSIS — N189 Chronic kidney disease, unspecified: Secondary | ICD-10-CM | POA: Diagnosis present

## 2012-07-23 DIAGNOSIS — M7120 Synovial cyst of popliteal space [Baker], unspecified knee: Secondary | ICD-10-CM

## 2012-07-23 HISTORY — DX: Acute pulmonary edema: J81.0

## 2012-07-23 HISTORY — DX: Synovial cyst of popliteal space (Baker), unspecified knee: M71.20

## 2012-07-23 HISTORY — DX: Nonrheumatic mitral (valve) insufficiency: I34.0

## 2012-07-23 HISTORY — DX: Cellulitis, unspecified: L03.90

## 2012-07-23 LAB — COMPREHENSIVE METABOLIC PANEL
ALT: 12 U/L (ref 0–53)
Alkaline Phosphatase: 69 U/L (ref 39–117)
CO2: 29 mEq/L (ref 19–32)
Chloride: 100 mEq/L (ref 96–112)
GFR calc Af Amer: 35 mL/min — ABNORMAL LOW (ref >90)
Glucose, Bld: 143 mg/dL — ABNORMAL HIGH (ref 70–99)
Potassium: 4 mEq/L (ref 3.5–5.1)
Sodium: 136 mEq/L (ref 135–145)
Total Bilirubin: 0.6 mg/dL (ref 0.3–1.2)
Total Protein: 6.5 g/dL (ref 6.0–8.3)

## 2012-07-23 LAB — GLUCOSE, CAPILLARY: Glucose-Capillary: 145 mg/dL — ABNORMAL HIGH (ref 70–99)

## 2012-07-23 LAB — CBC
Hemoglobin: 9.4 g/dL — ABNORMAL LOW (ref 13.0–17.0)
Hemoglobin: 9.5 g/dL — ABNORMAL LOW (ref 13.0–17.0)
MCHC: 31.9 g/dL (ref 30.0–36.0)
RBC: 3.42 MIL/uL — ABNORMAL LOW (ref 4.22–5.81)
RBC: 3.52 MIL/uL — ABNORMAL LOW (ref 4.22–5.81)
WBC: 5.8 10*3/uL (ref 4.0–10.5)
WBC: 5.2 10*3/uL (ref 4.0–10.5)

## 2012-07-23 LAB — CREATININE, SERUM
Creatinine, Ser: 1.91 mg/dL — ABNORMAL HIGH (ref 0.50–1.35)
GFR calc non Af Amer: 31 mL/min — ABNORMAL LOW (ref >90)

## 2012-07-23 LAB — PROTIME-INR: INR: 1.39 (ref 0.00–1.49)

## 2012-07-23 LAB — CARDIAC PANEL(CRET KIN+CKTOT+MB+TROPI)
Relative Index: INVALID (ref 0.0–2.5)
Troponin I: <0.3 ng/mL (ref <0.30)

## 2012-07-23 LAB — POCT I-STAT 3, ART BLOOD GAS (G3+)
Acid-Base Excess: 5 mmol/L — ABNORMAL HIGH (ref 0.0–2.0)
Bicarbonate: 28.6 mEq/L — ABNORMAL HIGH (ref 20.0–24.0)
pO2, Arterial: 110 mmHg — ABNORMAL HIGH (ref 80.0–100.0)

## 2012-07-23 MED ORDER — HEPARIN SODIUM (PORCINE) 5000 UNIT/ML IJ SOLN
5000.0000 [IU] | Freq: Three times a day (TID) | INTRAMUSCULAR | Status: DC
  Administered 2012-07-23 – 2012-07-24 (×3): 5000 [IU] via SUBCUTANEOUS
  Filled 2012-07-23 (×6): qty 1

## 2012-07-23 MED ORDER — FUROSEMIDE 10 MG/ML IJ SOLN
40.0000 mg | Freq: Once | INTRAMUSCULAR | Status: AC
  Administered 2012-07-23: 40 mg via INTRAVENOUS
  Filled 2012-07-23: qty 4

## 2012-07-23 MED ORDER — SIMVASTATIN 10 MG PO TABS
10.0000 mg | ORAL_TABLET | Freq: Every day | ORAL | Status: DC
  Administered 2012-07-23: 10 mg via ORAL
  Filled 2012-07-23 (×2): qty 1

## 2012-07-23 MED ORDER — ACETAMINOPHEN 500 MG PO TABS
500.0000 mg | ORAL_TABLET | ORAL | Status: DC | PRN
  Filled 2012-07-23: qty 1

## 2012-07-23 MED ORDER — MULTIVITAMINS PO CAPS: 1.0000 | ORAL_CAPSULE | Freq: Every day | ORAL | Status: DC

## 2012-07-23 MED ORDER — PANTOPRAZOLE SODIUM 40 MG PO TBEC
40.0000 mg | DELAYED_RELEASE_TABLET | Freq: Every day | ORAL | Status: DC
  Administered 2012-07-23 – 2012-07-25 (×3): 40 mg via ORAL
  Filled 2012-07-23 (×4): qty 1

## 2012-07-23 MED ORDER — ADULT MULTIVITAMIN W/MINERALS CH
1.0000 | ORAL_TABLET | Freq: Every day | ORAL | Status: DC
  Administered 2012-07-23 – 2012-07-26 (×4): 1 via ORAL
  Filled 2012-07-23 (×4): qty 1

## 2012-07-23 MED ORDER — TRAVOPROST (BAK FREE) 0.004 % OP SOLN
1.0000 [drp] | Freq: Two times a day (BID) | OPHTHALMIC | Status: DC
  Administered 2012-07-23 – 2012-07-26 (×7): 1 [drp] via OPHTHALMIC
  Filled 2012-07-23: qty 2.5

## 2012-07-23 MED ORDER — SODIUM CHLORIDE 0.9 % IV SOLN: 250.0000 mL | INTRAVENOUS | Status: DC | PRN

## 2012-07-23 MED ORDER — SODIUM CHLORIDE 0.9 % IJ SOLN: 3.0000 mL | INTRAMUSCULAR | Status: DC | PRN

## 2012-07-23 MED ORDER — NITROGLYCERIN 0.4 MG SL SUBL: 0.4000 mg | SUBLINGUAL_TABLET | SUBLINGUAL | Status: DC | PRN

## 2012-07-23 MED ORDER — FUROSEMIDE 10 MG/ML IJ SOLN
40.0000 mg | Freq: Two times a day (BID) | INTRAMUSCULAR | Status: DC
  Administered 2012-07-23: 40 mg via INTRAVENOUS
  Filled 2012-07-23 (×3): qty 4

## 2012-07-23 MED ORDER — ONDANSETRON HCL 4 MG/2ML IJ SOLN: 4.0000 mg | Freq: Four times a day (QID) | INTRAMUSCULAR | Status: DC | PRN

## 2012-07-23 MED ORDER — INSULIN ASPART 100 UNIT/ML ~~LOC~~ SOLN
0.0000 [IU] | Freq: Three times a day (TID) | SUBCUTANEOUS | Status: DC
  Administered 2012-07-23: 2 [IU] via SUBCUTANEOUS
  Administered 2012-07-23 – 2012-07-25 (×2): 1 [IU] via SUBCUTANEOUS
  Administered 2012-07-25: 2 [IU] via SUBCUTANEOUS
  Administered 2012-07-25: 5 [IU] via SUBCUTANEOUS
  Administered 2012-07-26: 3 [IU] via SUBCUTANEOUS
  Administered 2012-07-26: 1 [IU] via SUBCUTANEOUS
  Filled 2012-07-23: qty 2

## 2012-07-23 MED ORDER — VITAMIN D3 25 MCG (1000 UNIT) PO TABS
1000.0000 [IU] | ORAL_TABLET | Freq: Every day | ORAL | Status: DC
  Administered 2012-07-23 – 2012-07-26 (×4): 1000 [IU] via ORAL
  Filled 2012-07-23 (×4): qty 1

## 2012-07-23 MED ORDER — CARVEDILOL 25 MG PO TABS
25.0000 mg | ORAL_TABLET | Freq: Two times a day (BID) | ORAL | Status: DC
  Administered 2012-07-23 – 2012-07-26 (×5): 25 mg via ORAL
  Filled 2012-07-23 (×8): qty 1

## 2012-07-23 MED ORDER — TAMSULOSIN HCL 0.4 MG PO CAPS
0.4000 mg | ORAL_CAPSULE | Freq: Every day | ORAL | Status: DC
  Administered 2012-07-23 – 2012-07-25 (×3): 0.4 mg via ORAL
  Filled 2012-07-23 (×4): qty 1

## 2012-07-23 MED ORDER — ASPIRIN EC 325 MG PO TBEC
325.0000 mg | DELAYED_RELEASE_TABLET | Freq: Every day | ORAL | Status: DC
  Administered 2012-07-23 – 2012-07-26 (×4): 325 mg via ORAL
  Filled 2012-07-23 (×4): qty 1

## 2012-07-23 MED ORDER — SODIUM CHLORIDE 0.9 % IJ SOLN
3.0000 mL | Freq: Two times a day (BID) | INTRAMUSCULAR | Status: DC
  Administered 2012-07-23 (×2): 3 mL via INTRAVENOUS

## 2012-07-23 NOTE — ED Notes (Signed)
Breakfast Ordered 

## 2012-07-23 NOTE — Progress Notes (Signed)
*  Preliminary Results* Bilateral lower extremity venous duplex completed. There is no obvious evidence of deep vein thrombosis bilaterally. Unable to visualize mid to distal segment of the right posterior tibial vein, therefore deep vein thrombosis cannot be excluded in this segment.  07/23/2012 3:34 PM Gertie Fey, RDMS, RDCS

## 2012-07-23 NOTE — H&P (Signed)
History and Physical  Patient ID: [name] [name] MRN: [id number], DOB: 06-Jan-1926 Date of Encounter: 07/23/2012, 8:43 AM Primary Physician: [name] Spikes, DO Primary Cardiologist: Dr. Daleen [name]  Chief Complaint: shortness of breath  HPI: [name] is an 78 y/o M with a hx of CVA, colon cancer, CKG CAD s/p NSTEMI 12/2011 with flash pulmonary edema requiring intubation, as well as severe AS who went on to have CABG/pericardial AVR 01/2012. His post-op course was complicated by L pleural effusion requiring thoracentesis as well as RLE cellulitis (+serratia) in 03/2012. He last saw Dr. Daleen [name] 05/2012 and was felt to be doing well. He presented to [facility] today with complaints of SOB. He reports a functional decline over the last few months since his surgery, more recently in the last 3 weeks with progressive weakness, dyspnea with exertion, orthopnea that "settles itself out" once he lies down, and LEE. He has started having intermittent episodes of respiratory distress like "gasping for air." Breathing is better sitting up in a recliner, but even with this measure had difficulty breathing last night so came to the ER. He reports are palpitations but does not relay them to these events. No chest pain/pressure, syncope, fevers, chills, nausea, diaphoresis, or appetite changes. He monitors his blood pressure at home and reports it has been consistently WNL. Here it was 165/79 on arrival. He does not use added salt. He saw his PCP for his progressive weakness in early August with CT head performed at that time demonstrating no acute abnormality.   In the ED, CXR demonstrates moderate CHF, moderate L pleural effusion with compressive atelectasis. Labs [place] [address], BUN/Cr 32/1.93, Hgb 9.4. (OP values: Cr 2.11 & Hgb 9.3 04/2012). Troponin I is neg x 1. He was given 40mg  IV Lasix in the ED with some improvement in symptoms.  Past Medical History  Diagnosis Date  . HTN (hypertension)     . History of colon cancer     s/p colon resection  . Aortic stenosis     a. s/p AVR with 23mm Edwards pericardial valve 02/07/12 - post-op course complicated by pleural effusion requring thoracentesis/leg cellulitis 03/2012.  Marland Kitchen CAD (coronary artery disease)     a. s/p NSTEMI 12/2011:  LHC - Ostial left main 20%, ostial LAD 50%, mid 60-70%, ostial D1 40% and mid 40%, D2 70%, ostial circumflex occluded, ostial RCA 80-90%, LVEDP was 42. b.  s/p CABG x 3 at time of AVR (LiMA-LAD, SVG-2nd daigonal, SVG-PDA) 02/07/12 (post-op course noted above).  . Ischemic cardiomyopathy   . Chronic systolic heart failure     a. TEE 01/2012: EF 25-30%, diffuse hypokinesis. b. Not on ACEI due to renal insufficiency.  Marland Kitchen HLD (hyperlipidemia)   . Stroke 10/01  . GERD (gastroesophageal reflux disease)   . Chronic kidney disease   . Myocardial infarction   . Pleural effusion     a. post-operatively after AVR/CABG s/p thoracentesis 03/2012 yielding 1L serosanguinous fluid.  . Diabetes mellitus     borderline  . Blood transfusion     NO REACTION TO TRANSFUSION  . Cellulitis     a. RLE cellulitis 2 months post-operatively after AVR/CABG - serratia marcessans, tx with I&D/antibiotics  . Mitral regurgitation     Moderate by TEE 01/2012  . Flash pulmonary edema     Post-cath 12/2011, went into acute pulm edema requiring IV lasix and intubation     Most Recent Cardiac Studies: TEE 02/07/12 Study Conclusions (Intraoperative transesophageal echocardiography) -  Left ventricle: Systolic function was severely reduced. The estimated ejection fraction was in the range of 25% to 30%. Diffuse hypokinesis.  - Aortic valve: Moderately to severely calcified annulus. Trileaflet; severely thickened, severely calcified leaflets. Valve area: 0.87cm^2(VTI). Valve area: 0.87cm^2 (Vmax). - Mitral valve: Mildly calcified annulus. Mildly thickened leaflets . Moderate regurgitation, with multiple jets directed eccentrically and toward the free  wall. - Right atrium: No evidence of thrombus in the atrial cavity or appendage. - Atrial septum: No defect or patent foramen ovale was identified. Echo contrast study showed no right-to-left atrial level shunt, following an increase in RA pressure induced by provocative maneuvers. - Tricuspid valve: No evidence of vegetation.   Cardiac Cath 12/2011 (pre-cabg/AVR) Procedural Findings:  Hemodynamics:  AO 148/82 with a mean of 111 mmHg  LV 162 with an EDP of 42 mmHg  Coronary angiography:  Coronary dominance: right  Left mainstem: Mild ostial disease of 20%.  Left anterior descending (LAD): 50% ostial disease. The mid LAD is diffusely diseased up to 60-70%. The first diagonal branch has 40% disease at the origin and in the mid vessel. The second diagonal branch has 70% disease.  Left circumflex (LCx): Occluded at the ostium. There is minimal left to left collateral.  Right coronary artery (RCA): This is a dominant vessel. There is an 80-90% ostial stenosis.  Left ventriculography: Not performed to avoid increased contrast load.  Final Conclusions:  1. Severe 3 vessel obstructive coronary disease.  2. Severely elevated left ventricular filling pressures.  Recommendations:  Aggressive medical therapy for pulmonary edema. Patient will  be transferred to the intensive care unit for IV Lasix, nitroglycerin, and BiPAP therapy.    Surgical History:  Past Surgical History  Procedure Date  . Colon resection 1996  . Esophageal dilation   . Colonoscopy   . Cataract extraction     rt  . Cardiac catheterization     1.3.13  stopped breathing, put on ventilator for 5-6 days  . Eye surgery   . Tonsillectomy   . Aortic valve replacement 02/07/2012    Procedure: AORTIC VALVE REPLACEMENT (AVR);  Surgeon: [name], MD;  Location: [facility] OR;  Service: Open Heart Surgery;  Laterality: N/A;  . Coronary artery bypass graft 02/07/2012    Procedure: CORONARY ARTERY BYPASS GRAFTING (CABG);  Surgeon: [name], MD;  Location: [facility] OR;  Service: Open Heart Surgery;  Laterality: N/A;  CABG x three; using right leg greater saphenous vein harvested endoscopically     Home Meds: Prior to Admission medications   Medication Sig Start Date End Date Taking? Authorizing Provider  aspirin EC 325 MG EC tablet Take 1 tablet (325 mg total) by mouth daily. 02/12/12  Yes Donielle Margaretann Loveless, PA  carvedilol (COREG) 25 MG tablet TAKE 1 TABLET BY MOUTH TWICE DAILY WITH A MEAL 07/18/12  Yes Gaylord Shih, MD  cholecalciferol (VITAMIN D) 1000 UNITS tablet Take 1,000 Units by mouth daily.    Yes Historical Provider, MD  furosemide (LASIX) 40 MG tablet TAKE 1 TABLET BY MOUTH ONCE DAILY 06/03/12  Yes Gaylord Shih, MD  Multiple Vitamin (MULTIVITAMIN) capsule Take 1 capsule by mouth daily.     Yes Historical Provider, MD  nitroGLYCERIN (NITROSTAT) 0.4 MG SL tablet Place 0.4 mg under the tongue every 5 (five) minutes x 3 doses as needed. For chest pain   Yes Historical Provider, MD  omeprazole (PRILOSEC) 20 MG capsule Take 20 mg by mouth daily.     Yes Historical  Provider, MD  simvastatin (ZOCOR) 10 MG tablet Take 10 mg by mouth at bedtime.     Yes Historical Provider, MD  Tamsulosin HCl (FLOMAX) 0.4 MG CAPS TAKE 1 CAPSULE BY MOUTH ONCE DAILY AFTER SUPPER 06/14/12  Yes Gaylord Shih, MD  Travoprost, BAK Free, (TRAVATAN) 0.004 % SOLN ophthalmic solution Place 1 drop into both eyes 2 (two) times daily.   Yes Historical Provider, MD    Allergies: No Known Allergies  History   Social History  . Marital Status: Married    Spouse Name: N/A    Number of Children: N/A  . Years of Education: N/A   Occupational History  . Not on file.   Social History Main Topics  . Smoking status: Never Smoker   . Smokeless tobacco: Never Used  . Alcohol Use: No  . Drug Use: No  . Sexually Active: Not Currently   Other Topics Concern  . Not on file   Social History Narrative  . No narrative on file     Family History  Problem  Relation Age of Onset  . Cancer    . Heart attack    . Diabetes    . Heart failure    . Cancer Mother   . Heart attack Father     Review of Systems: General: negative for chills, fever, night sweats or weight changes.  Cardiovascular: see above Dermatological: negative for rash Respiratory: negative for cough or wheezing Urologic: negative for hematuria Abdominal: negative for nausea, vomiting, diarrhea, bright red blood per rectum, melena, or hematemesis Neurologic: negative for visual changes, syncope, or dizziness, but +"overall weak" All other systems reviewed and are otherwise negative except as noted above.  Labs:   Lab Results  Component Value Date   WBC 5.8 07/23/2012   HGB 9.4* 07/23/2012   HCT 29.5* 07/23/2012   MCV 86.3 07/23/2012   PLT 154 07/23/2012     Lab 07/23/12 0433  NA 136  K 4.0  CL 100  CO2 29  BUN 32*  CREATININE 1.93*  CALCIUM 9.1  PROT 6.5  BILITOT 0.6  ALKPHOS 69  ALT 12  AST 14  GLUCOSE 143*    Basename 07/23/12 0433  CKTOTAL --  CKMB --  TROPONINI <0.30   Lab Results  Component Value Date   CHOL 124 12/14/2011   HDL 47 12/14/2011   LDLCALC 63 12/14/2011   TRIG 69 12/14/2011   Radiology/Studies:  Chest Port 1 View 07/23/2012  *RADIOLOGY REPORT*  Clinical Data: Short of breath.  PORTABLE CHEST - 1 VIEW  Comparison: 04/23/2012.  Findings: Increasing bilateral pleural effusions, left moderate and right small.  Diffuse airspace disease is present with perihilar and basilar predominant distribution compatible with CHF. Compressive atelectasis of the left lower lobe.  Median sternotomy and aortic valve replacement. Monitoring leads are projected over the chest.  IMPRESSION: Moderate CHF.  Moderate left pleural effusion with compressive atelectasis.  Original Report Authenticated By: Andreas Newport, M.D.     EKG: NSR 65bpm, biphasic T I, TWI avL, slight ST depression 1mm V6, ST segment abnormalities in V2-V3. Lateral changes are more pronounced  compared to 03/06/2012.  Physical Exam: Blood pressure 153/58, pulse 69, temperature 99 F (37.2 C), temperature source Oral, resp. rate 20, SpO2 98.00%. General: Well developed thin elderly WM in no acute distress. Head: Normocephalic, atraumatic, sclera non-icteric, no xanthomas, nares are without discharge.  Neck: Negative for carotid bruits. JVD moderately elevated. Lungs: Decreased BS about 1/2 way up, coarse beyond  that. No overt rales, wheezes or rhonchi. Breathing is unlabored on [place]. Heart: RRR with S1 S2. Very soft systolic murmur, no diastolic murmur heard. No rubs or gallops appreciated. Sternal wound appears to be healving well. Abdomen: Soft, non-tender, non-distended with normoactive bowel sounds. No hepatomegaly. No rebound/guarding. No obvious abdominal masses. Msk:  Strength and tone appear normal for age. Extremities: No clubbing or cyanosis. 1-2+ LE edema. His RLE does appear somewhat more generally erythematous than the L, but no difference in warmth of extremities. No suppuration or dehiscence of harvest site. Distal pedal pulses are 1+ and equal bilaterally. Neuro: Alert and oriented X 3. Moves all extremities spontaneously. Psych:  Responds to questions appropriately with a normal affect.    ASSESSMENT AND PLAN:  Mr. Gonzaga is an 78 y/o M with hx of CVA, CKD and CAD s/p CABG / pericardial AVR for AS in Feb 2013 whose post-op course was complicated in April 2013 by RLE cellulitis requiring antibiotic/I&D as well as L pleural effusion requiring tap. He presents with weakness/dyspnea, CHF, & recurrent L pleural effusion.  1. Acute on chronic systolic CHF - unclear precipitant but this appears to have been a progressive process. Will admit, diurese, follow daily weights as well as I&O's. See below re: pleural effusion. In regards to his RLE erythema, will obtain LE dopplers to rule out DVT (note pt is afebrile, normal WBC). Continue to hold off on ACEI given CKD & need for  diuresis.  2. Recurrent left pleural effusion - will ask TCTS to see regarding their input on possible repeat thoracentesis. 3. CAD s/p CABG - no chest pain. Dyspnea likely related to #1/#2. Continue ASA, BB, statin. 4. Aortic stenosis s/p tissue AVR - No AI on exam. Hold off on echo for now. 5. Chronic renal insufficiency, stage III-IV - will need to watch Cr closely with diuresis. Cr appears at baseline. 6. H/o borderline DM - not on any agents at home. Add CBG checks and SSI. 7. Anemia - Hgb in line with 04/2012 but may actually be higher given volume overload. No overt signs/symptoms of bleeding. Repeat PT/INR in AM. Follow CBC.  Signed, Dayna Dunn PA-C 07/23/2012, 8:43 AM  History and all data above reviewed.  Patient examined.  I agree with the findings as above.  He has has progressive dyspnea similar to previous.  He is no SOB trying to climb the stairs.  No fevers or chills.  Weights stable.    The patient exam reveals COR:RRR, no rub  ,  Lungs: Decreased BS on the left base  ,  Abd: Positive bowel sounds, no rebound no guarding, Ext Mild edema right greater than left.  All available labs, radiology testing, previous records reviewed. Agree with documented assessment and plan. We will admit for diuresis but he will need repeat thoracentesis and consideration of further treatment of this recurrent effusion.  We have contacted [facility].  Fayrene Fearing Haniah Penny  9:00 AM  07/23/2012

## 2012-07-23 NOTE — ED Provider Notes (Addendum)
History     CSN: [id number]  Arrival date & time 07/23/12  0419   First MD Initiated Contact with Patient 07/23/12 [phone]      Chief Complaint  Patient presents with  . Shortness of Breath    (Consider location/radiation/quality/duration/timing/severity/associated sxs/prior treatment) Patient is a 78 y.o. male presenting with shortness of breath. The history is provided by the patient.  Shortness of Breath  The current episode started today. The problem occurs rarely. The problem has been resolved. The problem is moderate. Nothing relieves the symptoms. Associated symptoms include shortness of breath. Urine output has been normal. There were no sick contacts.    Past Medical History  Diagnosis Date  . HTN (hypertension)   . History of colon cancer   . Aortic stenosis     Echocardiogram 12/13/11: EF 25-30%, posterolateral akinesis and diffuse hypokinesis, grade 2 diastolic dysfunction, moderate-severe AS, aortic valve area 1.03, mean gradient 16, mild-moderate MR, moderate LAE  . CAD (coronary artery disease)     s/p NSTEMI 12/2011:  LHC - Ostial left main 20%, ostial LAD 50%, mid 60-70%, ostial D1 40% and mid 40%, D2 70%, ostial circumflex occluded, ostial RCA 80-90%, LVEDP was 42;  plan eventually for CABG/AVR with Dr. Laneta Simmers  . Ischemic cardiomyopathy   . Chronic systolic heart failure   . CKD (chronic kidney disease)   . HLD (hyperlipidemia)   . DM2 (diabetes mellitus, type 2)   . Angina   . Heart murmur   . Cancer     colon resection  . Stroke 10/01  . GERD (gastroesophageal reflux disease)   . Chronic kidney disease   . Myocardial infarction   . Pleural effusion   . CHF (congestive heart failure)   . Diabetes mellitus     borderline  . Blood transfusion     NO REACTION TO TRANSFUSION    Past Surgical History  Procedure Date  . Colon resection 1996  . Esophageal dilation   . Colonoscopy   . Cataract extraction     rt  . Cardiac catheterization     1.3.13   stopped breathing, put on ventalator for 5-6 days  . Eye surgery   . Tonsillectomy   . Aortic valve replacement 02/07/2012    Procedure: AORTIC VALVE REPLACEMENT (AVR);  Surgeon: [name], MD;  Location: [facility] OR;  Service: Open Heart Surgery;  Laterality: N/A;  . Coronary artery bypass graft 02/07/2012    Procedure: CORONARY ARTERY BYPASS GRAFTING (CABG);  Surgeon: [name], MD;  Location: [facility] OR;  Service: Open Heart Surgery;  Laterality: N/A;  CABG x three; using right leg greater saphenous vein harvested endoscopically    Family History  Problem Relation Age of Onset  . Cancer    . Heart attack    . Diabetes    . Heart failure    . Cancer Mother   . Heart attack Father     History  Substance Use Topics  . Smoking status: Never Smoker   . Smokeless tobacco: Never Used  . Alcohol Use: No      Review of Systems  Respiratory: Positive for shortness of breath.   All other systems reviewed and are negative.    Allergies  Review of patient's allergies indicates no known allergies.  Home Medications   Current Outpatient Rx  Name Route Sig Dispense Refill  . ASPIRIN 325 MG PO TBEC Oral Take 1 tablet (325 mg total) by mouth daily. 30 tablet   .  CARVEDILOL 25 MG PO TABS  TAKE 1 TABLET BY MOUTH TWICE DAILY WITH A MEAL 60 tablet 5  . VITAMIN D 1000 UNITS PO TABS Oral Take 1,000 Units by mouth daily.     . FUROSEMIDE 40 MG PO TABS  TAKE 1 TABLET BY MOUTH ONCE DAILY 30 tablet 6  . METIPRANOLOL 0.3 % OP SOLN Both Eyes Place 1 drop into both eyes 2 (two) times daily.     . MULTIVITAMINS PO CAPS Oral Take 1 capsule by mouth daily.      Marland Kitchen NITROGLYCERIN 0.4 MG SL SUBL Sublingual Place 0.4 mg under the tongue every 5 (five) minutes as needed.    Marland Kitchen OMEPRAZOLE 20 MG PO CPDR Oral Take 20 mg by mouth daily.      Marland Kitchen SIMVASTATIN 10 MG PO TABS Oral Take 10 mg by mouth at bedtime.      . TAMSULOSIN HCL 0.4 MG PO CAPS  TAKE 1 CAPSULE BY MOUTH ONCE DAILY AFTER SUPPER 30 capsule 1    BP  165/79  Pulse 69  Temp 99 F (37.2 C) (Oral)  Resp 18  SpO2 100%  Physical Exam  Constitutional: He is oriented to person, place, and time. He appears well-developed and well-nourished.  HENT:  Head: Normocephalic and atraumatic.  Eyes: Conjunctivae are normal. Pupils are equal, round, and reactive to light.  Neck: Normal range of motion. Neck supple.  Cardiovascular: Normal rate, regular rhythm, normal heart sounds and intact distal pulses.   Pulmonary/Chest: Effort normal and breath sounds normal.  Abdominal: Soft. Bowel sounds are normal.  Musculoskeletal: He exhibits edema.       Right shoulder: He exhibits swelling.       Right ankle: He exhibits swelling.       Left ankle: He exhibits swelling.  Neurological: He is alert and oriented to person, place, and time.  Skin: Skin is warm and dry.  Psychiatric: He has a normal mood and affect. His behavior is normal. Judgment and thought content normal.    ED Course  Procedures (including critical care time)   Labs Reviewed  CBC  COMPREHENSIVE METABOLIC PANEL  TROPONIN I  PRO B NATRIURETIC PEPTIDE  PROTIME-INR   No results found.   No diagnosis found.    Date: 07/23/2012  Rate: 65  Rhythm: normal sinus rhythm  QRS Axis: left  Intervals: PR prolonged  ST/T Wave abnormalities: normal  Conduction Disutrbances:nonspecific intraventricular conduction delay  Narrative Interpretation:   Old EKG Reviewed: none available  MDM  + hx of chf,  Sob this am.  Improved with supplemental o2 at home.   + chf,  Pleural effusion.  Discussed with labauer on call,  Will admit        Rosanne Ashing, MD 07/23/12 [id number]  Corryn Madewell Lytle Michaels, MD 07/23/12 [phone]

## 2012-07-23 NOTE — Progress Notes (Signed)
Pt admitted to room 4709 from the ED.  Pt alert and oriented, VS wnl, pt placed on tele, and pt oriented to room and call light.  Will carry out MD orders and continue to monitor.

## 2012-07-23 NOTE — ED Notes (Signed)
Report called to RN on [id number]. 

## 2012-07-23 NOTE — ED Notes (Signed)
Per EMS pt. Has c/o SOB.  Pt. Was in the tripod position and unable to speak.  Pt. Noted with shallow breathing and being pale.  Pt. Reports that when he takes a deep breath he feels "like a mass is on his left side."  Pt. Has a past surgery in February and the leg got infected and he has felt weak since then.

## 2012-07-23 NOTE — Progress Notes (Signed)
Utilization review completed.  

## 2012-07-23 NOTE — ED Notes (Signed)
Pt resting on stretcher, nad noted, abc intact, deneis needs at this time. Will monitor pt.

## 2012-07-24 ENCOUNTER — Inpatient Hospital Stay (HOSPITAL_COMMUNITY): Payer: Medicare Other

## 2012-07-24 ENCOUNTER — Encounter (HOSPITAL_COMMUNITY)
Admission: EM | Disposition: A | Discharge status: Home / Self Care | Payer: Self-pay | Source: Home / Self Care | Attending: Cardiology

## 2012-07-24 ENCOUNTER — Inpatient Hospital Stay (HOSPITAL_COMMUNITY): Payer: Medicare Other | Admitting: Anesthesiology

## 2012-07-24 ENCOUNTER — Encounter (HOSPITAL_COMMUNITY): Payer: Self-pay | Admitting: Anesthesiology

## 2012-07-24 DIAGNOSIS — I5022 Chronic systolic (congestive) heart failure: Secondary | ICD-10-CM | POA: Diagnosis not present

## 2012-07-24 DIAGNOSIS — R091 Pleurisy: Secondary | ICD-10-CM | POA: Diagnosis not present

## 2012-07-24 DIAGNOSIS — J90 Pleural effusion, not elsewhere classified: Secondary | ICD-10-CM

## 2012-07-24 HISTORY — PX: CHEST TUBE INSERTION: SHX231

## 2012-07-24 LAB — BASIC METABOLIC PANEL
BUN: 35 mg/dL — ABNORMAL HIGH (ref 6–23)
Calcium: 9 mg/dL (ref 8.4–10.5)
GFR calc non Af Amer: 29 mL/min — ABNORMAL LOW (ref >90)
Glucose, Bld: 119 mg/dL — ABNORMAL HIGH (ref 70–99)
Potassium: 3.7 mEq/L (ref 3.5–5.1)

## 2012-07-24 LAB — CBC WITH DIFFERENTIAL/PLATELET
Basophils Absolute: 0 10*3/uL (ref 0.0–0.1)
Eosinophils Relative: 1 % (ref 0–5)
HCT: 29.3 % — ABNORMAL LOW (ref 39.0–52.0)
Lymphocytes Relative: 23 % (ref 12–46)
MCV: 87.2 fL (ref 78.0–100.0)
Monocytes Absolute: 0.5 10*3/uL (ref 0.1–1.0)
RDW: 14.7 % (ref 11.5–15.5)
WBC: 4.9 10*3/uL (ref 4.0–10.5)

## 2012-07-24 LAB — GLUCOSE, CAPILLARY: Glucose-Capillary: 116 mg/dL — ABNORMAL HIGH (ref 70–99)

## 2012-07-24 SURGERY — INSERTION, PLEURAL DRAINAGE CATHETER
Anesthesia: Monitor Anesthesia Care | Site: Chest | Laterality: Left | Wound class: Clean Contaminated

## 2012-07-24 SURGERY — Surgical Case
Anesthesia: *Unknown

## 2012-07-24 MED ORDER — MIDAZOLAM HCL 5 MG/5ML IJ SOLN
INTRAMUSCULAR | Status: DC | PRN
  Administered 2012-07-24: 2 mg via INTRAVENOUS

## 2012-07-24 MED ORDER — ESMOLOL HCL 10 MG/ML IV SOLN
INTRAVENOUS | Status: DC | PRN
  Administered 2012-07-24: 10 mg via INTRAVENOUS

## 2012-07-24 MED ORDER — LIDOCAINE HCL 1 % IJ SOLN
INTRAMUSCULAR | Status: DC | PRN
  Administered 2012-07-24: 9 mL

## 2012-07-24 MED ORDER — HYDROMORPHONE HCL PF 1 MG/ML IJ SOLN: 0.2500 mg | INTRAMUSCULAR | Status: DC | PRN

## 2012-07-24 MED ORDER — LIDOCAINE HCL (PF) 1 % IJ SOLN
INTRAMUSCULAR | Status: AC
  Filled 2012-07-24: qty 30

## 2012-07-24 MED ORDER — CEFAZOLIN SODIUM 1-5 GM-% IV SOLN
1.0000 g | INTRAVENOUS | Status: AC
  Administered 2012-07-24: 1 g via INTRAVENOUS
  Filled 2012-07-24: qty 50

## 2012-07-24 MED ORDER — LACTATED RINGERS IV SOLN
INTRAVENOUS | Status: DC | PRN
  Administered 2012-07-24: 15:00:00 via INTRAVENOUS

## 2012-07-24 MED ORDER — LIDOCAINE HCL (CARDIAC) 20 MG/ML IV SOLN
INTRAVENOUS | Status: DC | PRN
  Administered 2012-07-24: 40 mg via INTRAVENOUS

## 2012-07-24 MED ORDER — CEFAZOLIN SODIUM-DEXTROSE 2-3 GM-% IV SOLR
INTRAVENOUS | Status: AC
  Filled 2012-07-24: qty 50

## 2012-07-24 MED ORDER — PROPOFOL 10 MG/ML IV EMUL
INTRAVENOUS | Status: DC | PRN
  Administered 2012-07-24: 100 ug/kg/min via INTRAVENOUS

## 2012-07-24 MED ORDER — LACTATED RINGERS IV SOLN
INTRAVENOUS | Status: DC
  Administered 2012-07-24: 15:00:00 via INTRAVENOUS

## 2012-07-24 MED ORDER — ONDANSETRON HCL 4 MG/2ML IJ SOLN: 4.0000 mg | Freq: Once | INTRAMUSCULAR | Status: DC | PRN

## 2012-07-24 MED ORDER — 0.9 % SODIUM CHLORIDE (POUR BTL) OPTIME
TOPICAL | Status: DC | PRN
  Administered 2012-07-24: 1000 mL

## 2012-07-24 MED ORDER — CEFAZOLIN SODIUM-DEXTROSE 2-3 GM-% IV SOLR
INTRAVENOUS | Status: DC | PRN
  Administered 2012-07-24: 2 g via INTRAVENOUS

## 2012-07-24 MED ORDER — FUROSEMIDE 10 MG/ML IJ SOLN
40.0000 mg | Freq: Every day | INTRAMUSCULAR | Status: DC
  Administered 2012-07-24: 40 mg via INTRAVENOUS
  Filled 2012-07-24: qty 4

## 2012-07-24 SURGICAL SUPPLY — 23 items
CANISTER SUCTION 2500CC (MISCELLANEOUS) IMPLANT
CLOTH BEACON ORANGE TIMEOUT ST (SAFETY) ×2 IMPLANT
COVER SURGICAL LIGHT HANDLE (MISCELLANEOUS) ×4 IMPLANT
DERMABOND ADVANCED (GAUZE/BANDAGES/DRESSINGS) ×1
DERMABOND ADVANCED .7 DNX12 (GAUZE/BANDAGES/DRESSINGS) ×1 IMPLANT
DRAPE C-ARM 42X72 X-RAY (DRAPES) ×2 IMPLANT
DRAPE LAPAROSCOPIC ABDOMINAL (DRAPES) ×2 IMPLANT
DRAPE PROXIMA HALF (DRAPES) ×2 IMPLANT
GLOVE EUDERMIC 7 POWDERFREE (GLOVE) ×2 IMPLANT
GOWN PREVENTION PLUS XLARGE (GOWN DISPOSABLE) ×2 IMPLANT
KIT BASIN OR (CUSTOM PROCEDURE TRAY) ×2 IMPLANT
KIT PLEURX DRAIN  500ML BOTTLE (DRAIN) ×2 IMPLANT
KIT PLEURX DRAIN CATH 15.5FR (DRAIN) ×2 IMPLANT
KIT ROOM TURNOVER OR (KITS) ×2 IMPLANT
NS IRRIG 1000ML POUR BTL (IV SOLUTION) ×2 IMPLANT
PACK GENERAL/GYN (CUSTOM PROCEDURE TRAY) ×2 IMPLANT
PAD ARMBOARD 7.5X6 YLW CONV (MISCELLANEOUS) ×4 IMPLANT
SET DRAINAGE LINE (MISCELLANEOUS) IMPLANT
SUT ETHILON 3 0 PS 1 (SUTURE) ×2 IMPLANT
SUT VIC AB 3-0 X1 27 (SUTURE) ×2 IMPLANT
TOWEL OR 17X24 6PK STRL BLUE (TOWEL DISPOSABLE) ×2 IMPLANT
TOWEL OR 17X26 10 PK STRL BLUE (TOWEL DISPOSABLE) ×2 IMPLANT
WATER STERILE IRR 1000ML POUR (IV SOLUTION) ×2 IMPLANT

## 2012-07-24 NOTE — Consult Note (Signed)
[address]            [name] [address]          [id number]       Reason for Consult: Recurrent left pleural effusion s/p AVR/CABG in Feb 2013 Referring Physician: Dr. Rollene Rotunda  [name] [name] is an 78 y.o. male.  HPI:   The patient is an 78 year old gentleman known to me status post coronary bypass graft surgery and pericardial aortic valve replacement in February 2013. He initially presented in January 2013 with a non-ST segment elevation MI and flash pulmonary edema requiring brief intubation. His postoperative course was somewhat slow due to to his age and he subsequently returned in April with a large left pleural effusion that required thoracentesis. There was partial recurrence of the effusion but last time I saw him in June of 2013 a chest x-ray showed a small residual left pleural effusion and the patient was doing well. He also developed an infection in the right lower extremity vein harvest site with Serratia that responded to antibiotics and dressing changes. He now presented with complaints of worsening shortness of breath, orthopnea, and lower extremity edema as well as progressive weakness over the past couple months. His BNP was noted to be 30,000 and chest x-ray showed a large left pleural effusion with left lower lobe atelectasis. There is a small right pleural effusion. He had seen his primary physician in early August for progressive weakness and had a CT scan of the head that showed no acute abnormality but chronic changes of age.  Past Medical History  Diagnosis Date  . HTN (hypertension)   . History of colon cancer     s/p colon resection  . Aortic stenosis     a. s/p AVR with 23mm Edwards pericardial valve 02/07/12 - post-op course complicated by pleural effusion requring thoracentesis/leg cellulitis 03/2012.  Marland Kitchen CAD (coronary artery disease)     a. s/p NSTEMI 12/2011:  LHC - Ostial left main 20%, ostial LAD 50%, mid 60-70%,  ostial D1 40% and mid 40%, D2 70%, ostial circumflex occluded, ostial RCA 80-90%, LVEDP was 42. b.  s/p CABG x 3 at time of AVR (LiMA-LAD, SVG-2nd daigonal, SVG-PDA) 02/07/12 (post-op course noted above).  . Ischemic cardiomyopathy   . Chronic systolic heart failure     a. TEE 01/2012: EF 25-30%, diffuse hypokinesis. b. Not on ACEI due to renal insufficiency.  Marland Kitchen HLD (hyperlipidemia)   . Stroke 10/01  . GERD (gastroesophageal reflux disease)   . Chronic kidney disease   . Myocardial infarction   . Pleural effusion     a. post-operatively after AVR/CABG s/p thoracentesis 03/2012 yielding 1L serosanguinous fluid.  . Diabetes mellitus     borderline  . Blood transfusion     NO REACTION TO TRANSFUSION  . Cellulitis     a. RLE cellulitis 2 months post-operatively after AVR/CABG - serratia marcessans, tx with I&D/antibiotics  . Mitral regurgitation     Moderate by TEE 01/2012  . Flash pulmonary edema     Post-cath 12/2011, went into acute pulm edema requiring IV lasix and intubation    Past Surgical History  Procedure Date  . Colon resection 1996  . Esophageal dilation   . Colonoscopy   . Cataract extraction     rt  . Cardiac catheterization     1.3.13  stopped breathing, put on ventilator  for 5-6 days  . Eye surgery   . Tonsillectomy   . Aortic valve replacement 02/07/2012    Procedure: AORTIC VALVE REPLACEMENT (AVR);  Surgeon: [name], MD;  Location: [facility] OR;  Service: Open Heart Surgery;  Laterality: N/A;  . Coronary artery bypass graft 02/07/2012    Procedure: CORONARY ARTERY BYPASS GRAFTING (CABG);  Surgeon: [name], MD;  Location: [facility] OR;  Service: Open Heart Surgery;  Laterality: N/A;  CABG x three; using right leg greater saphenous vein harvested endoscopically    Family History  Problem Relation Age of Onset  . Cancer    . Heart attack    . Diabetes    . Heart failure    . Cancer Mother   . Heart attack Father     Social History:  reports that he has never  smoked. He has never used smokeless tobacco. He reports that he does not drink alcohol or use illicit drugs.  Allergies: No Known Allergies  Medications:  I have reviewed the patient's current medications. Prior to Admission:  Prescriptions prior to admission  Medication Sig Dispense Refill  . aspirin EC 325 MG EC tablet Take 1 tablet (325 mg total) by mouth daily.  30 tablet    . carvedilol (COREG) 25 MG tablet TAKE 1 TABLET BY MOUTH TWICE DAILY WITH A MEAL  60 tablet  5  . cholecalciferol (VITAMIN D) 1000 UNITS tablet Take 1,000 Units by mouth daily.       . furosemide (LASIX) 40 MG tablet TAKE 1 TABLET BY MOUTH ONCE DAILY  30 tablet  6  . Multiple Vitamin (MULTIVITAMIN) capsule Take 1 capsule by mouth daily.        . nitroGLYCERIN (NITROSTAT) 0.4 MG SL tablet Place 0.4 mg under the tongue every 5 (five) minutes x 3 doses as needed. For chest pain      . omeprazole (PRILOSEC) 20 MG capsule Take 20 mg by mouth daily.        . simvastatin (ZOCOR) 10 MG tablet Take 10 mg by mouth at bedtime.        . Tamsulosin HCl (FLOMAX) 0.4 MG CAPS TAKE 1 CAPSULE BY MOUTH ONCE DAILY AFTER SUPPER  30 capsule  1  . Travoprost, BAK Free, (TRAVATAN) 0.004 % SOLN ophthalmic solution Place 1 drop into both eyes 2 (two) times daily.       Scheduled:   . aspirin EC  325 mg Oral Daily  . carvedilol  25 mg Oral BID WC  .  ceFAZolin (ANCEF) IV  1 g Intravenous On Call to OR  . cholecalciferol  1,000 Units Oral Daily  . furosemide  40 mg Intravenous BID  . insulin aspart  0-9 Units Subcutaneous TID WC  . multivitamin with minerals  1 tablet Oral Daily  . pantoprazole  40 mg Oral Q1200  . simvastatin  10 mg Oral QHS  . sodium chloride  3 mL Intravenous Q12H  . Tamsulosin HCl  0.4 mg Oral QPC supper  . Travoprost (BAK Free)  1 drop Both Eyes BID  . DISCONTD: heparin  5,000 Units Subcutaneous Q8H  . DISCONTD: multivitamin  1 capsule Oral Daily   Continuous:  ZOX:WRUEAV chloride, acetaminophen,  nitroGLYCERIN, ondansetron (ZOFRAN) IV, sodium chloride  Results for orders placed during the hospital encounter of 07/23/12 (from the past 48 hour(s))  CBC     Status: Abnormal   Collection Time   07/23/12  4:33 AM      Component Value  Range Comment   WBC 5.8  4.0 - 10.5 K/uL    RBC 3.42 (*) 4.22 - 5.81 MIL/uL    Hemoglobin 9.4 (*) 13.0 - 17.0 g/dL    HCT 40.9 (*) 81.1 - 52.0 %    MCV 86.3  78.0 - 100.0 fL    MCH 27.5  26.0 - 34.0 pg    MCHC 31.9  30.0 - 36.0 g/dL    RDW 91.4  78.2 - 95.6 %    Platelets 154  150 - 400 K/uL   COMPREHENSIVE METABOLIC PANEL     Status: Abnormal   Collection Time   07/23/12  4:33 AM      Component Value Range Comment   Sodium 136  135 - 145 mEq/L    Potassium 4.0  3.5 - 5.1 mEq/L    Chloride 100  96 - 112 mEq/L    CO2 29  19 - 32 mEq/L    Glucose, Bld 143 (*) 70 - 99 mg/dL    BUN 32 (*) 6 - 23 mg/dL    Creatinine, Ser 2.13 (*) 0.50 - 1.35 mg/dL    Calcium 9.1  8.4 - 08.6 mg/dL    Total Protein 6.5  6.0 - 8.3 g/dL    Albumin 2.9 (*) 3.5 - 5.2 g/dL    AST 14  0 - 37 U/L    ALT 12  0 - 53 U/L    Alkaline Phosphatase 69  39 - 117 U/L    Total Bilirubin 0.6  0.3 - 1.2 mg/dL    GFR calc non Af Amer 30 (*) >90 mL/min    GFR calc Af Amer 35 (*) >90 mL/min   TROPONIN I     Status: Normal   Collection Time   07/23/12  4:33 AM      Component Value Range Comment   Troponin I <0.30  <0.30 ng/mL   PRO B NATRIURETIC PEPTIDE     Status: Abnormal   Collection Time   07/23/12  4:33 AM      Component Value Range Comment   Pro B Natriuretic peptide (BNP) 30452.0 (*) 0 - 450 pg/mL   PROTIME-INR     Status: Abnormal   Collection Time   07/23/12  4:33 AM      Component Value Range Comment   Prothrombin Time 17.3 (*) 11.6 - 15.2 seconds    INR 1.39  0.00 - 1.49   POCT I-STAT 3, BLOOD GAS (G3+)     Status: Abnormal   Collection Time   07/23/12  5:09 AM      Component Value Range Comment   pH, Arterial 7.502 (*) 7.350 - 7.450    pCO2 arterial 36.5  35.0 - 45.0  mmHg    pO2, Arterial 110.0 (*) 80.0 - 100.0 mmHg    Bicarbonate 28.6 (*) 20.0 - 24.0 mEq/L    TCO2 30  0 - 100 mmol/L    O2 Saturation 99.0      Acid-Base Excess 5.0 (*) 0.0 - 2.0 mmol/L    Collection site RADIAL, ALLEN'S TEST ACCEPTABLE      Drawn by Operator      Sample type ARTERIAL     GLUCOSE, CAPILLARY     Status: Abnormal   Collection Time   07/23/12  9:57 AM      Component Value Range Comment   Glucose-Capillary 176 (*) 70 - 99 mg/dL   CARDIAC PANEL(CRET KIN+CKTOT+MB+TROPI)     Status: Normal   Collection Time  07/23/12 11:30 AM      Component Value Range Comment   Total CK 38  7 - 232 U/L    CK, MB 2.3  0.3 - 4.0 ng/mL    Troponin I <0.30  <0.30 ng/mL    Relative Index RELATIVE INDEX IS INVALID  0.0 - 2.5   CBC     Status: Abnormal   Collection Time   07/23/12 11:30 AM      Component Value Range Comment   WBC 5.2  4.0 - 10.5 K/uL    RBC 3.52 (*) 4.22 - 5.81 MIL/uL    Hemoglobin 9.5 (*) 13.0 - 17.0 g/dL    HCT 60.4 (*) 54.0 - 52.0 %    MCV 86.9  78.0 - 100.0 fL    MCH 27.0  26.0 - 34.0 pg    MCHC 31.0  30.0 - 36.0 g/dL    RDW 98.1  19.1 - 47.8 %    Platelets 158  150 - 400 K/uL   CREATININE, SERUM     Status: Abnormal   Collection Time   07/23/12 11:30 AM      Component Value Range Comment   Creatinine, Ser 1.91 (*) 0.50 - 1.35 mg/dL    GFR calc non Af Amer 31 (*) >90 mL/min    GFR calc Af Amer 35 (*) >90 mL/min   GLUCOSE, CAPILLARY     Status: Abnormal   Collection Time   07/23/12 11:42 AM      Component Value Range Comment   Glucose-Capillary 145 (*) 70 - 99 mg/dL    Comment 1 Notify RN     GLUCOSE, CAPILLARY     Status: Abnormal   Collection Time   07/23/12  4:01 PM      Component Value Range Comment   Glucose-Capillary 110 (*) 70 - 99 mg/dL    Comment 1 Notify RN     GLUCOSE, CAPILLARY     Status: Abnormal   Collection Time   07/23/12  9:20 PM      Component Value Range Comment   Glucose-Capillary 159 (*) 70 - 99 mg/dL   BASIC METABOLIC PANEL      Status: Abnormal   Collection Time   07/24/12  6:15 AM      Component Value Range Comment   Sodium 140  135 - 145 mEq/L    Potassium 3.7  3.5 - 5.1 mEq/L    Chloride 100  96 - 112 mEq/L    CO2 30  19 - 32 mEq/L    Glucose, Bld 119 (*) 70 - 99 mg/dL    BUN 35 (*) 6 - 23 mg/dL    Creatinine, Ser 2.95 (*) 0.50 - 1.35 mg/dL    Calcium 9.0  8.4 - 62.1 mg/dL    GFR calc non Af Amer 29 (*) >90 mL/min    GFR calc Af Amer 34 (*) >90 mL/min   PROTIME-INR     Status: Abnormal   Collection Time   07/24/12  6:15 AM      Component Value Range Comment   Prothrombin Time 16.9 (*) 11.6 - 15.2 seconds    INR 1.35  0.00 - 1.49   CBC WITH DIFFERENTIAL     Status: Abnormal   Collection Time   07/24/12  6:15 AM      Component Value Range Comment   WBC 4.9  4.0 - 10.5 K/uL    RBC 3.36 (*) 4.22 - 5.81 MIL/uL    Hemoglobin 9.1 (*)  13.0 - 17.0 g/dL    HCT 45.4 (*) 09.8 - 52.0 %    MCV 87.2  78.0 - 100.0 fL    MCH 27.1  26.0 - 34.0 pg    MCHC 31.1  30.0 - 36.0 g/dL    RDW 11.9  14.7 - 82.9 %    Platelets 151  150 - 400 K/uL    Neutrophils Relative 66  43 - 77 %    Neutro Abs 3.2  1.7 - 7.7 K/uL    Lymphocytes Relative 23  12 - 46 %    Lymphs Abs 1.1  0.7 - 4.0 K/uL    Monocytes Relative 9  3 - 12 %    Monocytes Absolute 0.5  0.1 - 1.0 K/uL    Eosinophils Relative 1  0 - 5 %    Eosinophils Absolute 0.1  0.0 - 0.7 K/uL    Basophils Relative 0  0 - 1 %    Basophils Absolute 0.0  0.0 - 0.1 K/uL     Dg Chest Port 1 View  07/23/2012  *RADIOLOGY REPORT*  Clinical Data: Short of breath.  PORTABLE CHEST - 1 VIEW  Comparison: 04/23/2012.  Findings: Increasing bilateral pleural effusions, left moderate and right small.  Diffuse airspace disease is present with perihilar and basilar predominant distribution compatible with CHF. Compressive atelectasis of the left lower lobe.  Median sternotomy and aortic valve replacement. Monitoring leads are projected over the chest.  IMPRESSION: Moderate CHF.  Moderate left  pleural effusion with compressive atelectasis.  Original Report Authenticated By: Andreas Newport, M.D.    Review of Systems  Constitutional: Positive for weight loss and malaise/fatigue. Negative for fever, chills and diaphoresis.  HENT: Negative.   Eyes: Negative.   Respiratory: Positive for cough and shortness of breath. Negative for hemoptysis, sputum production and wheezing.   Cardiovascular: Positive for orthopnea, leg swelling and PND. Negative for chest pain, palpitations and claudication.  Gastrointestinal: Negative.   Genitourinary: Negative.   Musculoskeletal:       Reports difficulty using right leg. He says that when he walks he has trouble bending the leg up and therefore flops the leg on the ground with his gait. He says this has been present since surgery but I don't remember him mentioning it before.  Skin: Negative.   Neurological: Positive for weakness.  Endo/Heme/Allergies: Negative.   Psychiatric/Behavioral: Negative.    Blood pressure 166/70, pulse 65, temperature 98.6 F (37 C), temperature source Oral, resp. rate 24, height 5\' 9"  (1.753 m), weight 69.4 kg (153 lb), SpO2 92.00%. Physical Exam  Constitutional: He is oriented to person, place, and time. He appears well-developed and well-nourished. No distress.  HENT:  Head: Normocephalic and atraumatic.  Mouth/Throat: Oropharynx is clear and moist.  Eyes: Conjunctivae and EOM are normal. Pupils are equal, round, and reactive to light.  Neck: Normal range of motion. Neck supple. No JVD present. No tracheal deviation present. No thyromegaly present.  Cardiovascular: Normal rate and regular rhythm.  Exam reveals no gallop and no friction rub.   Murmur heard.      2/6 systolic murmur at apex radiating into axilla.  Respiratory: Effort normal. No stridor.       Absent BS over LLL  GI: Soft. Bowel sounds are normal. He exhibits no distension and no mass. There is no tenderness.  Musculoskeletal: He exhibits edema.        Bilateral lower leg edema  Lymphadenopathy:    He has no cervical adenopathy.  Neurological: He is alert and oriented to person, place, and time. He has normal strength. No cranial nerve deficit or sensory deficit.  Skin: Skin is warm and dry.  Psychiatric: He has a normal mood and affect.    Assessment/Plan:  He has a recurrent large left pleural effusion with left lower lobe atelectasis and small right pleural effusion with signs of total body volume overload. I suspect this effusion is related to congestive heart failure [place]NP [place] 14782. He had some residual MR at the time of surgery that was mild to moderate and has a murmur on exam.  He has not had an echo since surgery.  I think the best way to manage his effusion is with a pleurx catheter to allow continuing drainage while his heart failure is treated. I don't think that the effusion will resolve until the heart failure is resolved. I discussed the procedure with him including aternatives, benefits, and risks and he agrees to proceed.  Will make NPO and do this afternoon.  BARTLE,BRYAN K 07/24/2012, 7:39 AM

## 2012-07-24 NOTE — Progress Notes (Signed)
Patient ID: Thor Nannini, male   DOB: 04/21/1926, 78 y.o.   MRN: [id number]   Patient Name: Chason Mciver Date of Encounter: 07/24/2012    SUBJECTIVE Breathing better this morning after diuresis. Creatinine increased a bit. Evaluated this morning by Dr. Laneta Simmers we will do a thoracentesis this afternoon.  CURRENT MEDS    . aspirin EC  325 mg Oral Daily  . carvedilol  25 mg Oral BID WC  .  ceFAZolin (ANCEF) IV  1 g Intravenous On Call to OR  . cholecalciferol  1,000 Units Oral Daily  . furosemide  40 mg Intravenous BID  . insulin aspart  0-9 Units Subcutaneous TID WC  . multivitamin with minerals  1 tablet Oral Daily  . pantoprazole  40 mg Oral Q1200  . simvastatin  10 mg Oral QHS  . sodium chloride  3 mL Intravenous Q12H  . Tamsulosin HCl  0.4 mg Oral QPC supper  . Travoprost (BAK Free)  1 drop Both Eyes BID  . DISCONTD: heparin  5,000 Units Subcutaneous Q8H  . DISCONTD: multivitamin  1 capsule Oral Daily    OBJECTIVE  Filed Vitals:   07/23/12 1808 07/23/12 2121 07/24/12 0240 07/24/12 0619  BP: 119/43 140/62 150/76 166/70  Pulse: 57 59 67 65  Temp: 97.4 F (36.3 C) 98.3 F (36.8 C) 98.1 F (36.7 C) 98.6 F (37 C)  TempSrc: Oral Oral Oral Oral  Resp: 19 18 18 24   Height:      Weight:      SpO2: 93% 96% 92% 92%    Intake/Output Summary (Last 24 hours) at 07/24/12 0750 Last data filed at 07/24/12 0729  Gross per 24 hour  Intake    465 ml  Output   2400 ml  Net  -1935 ml   Filed Weights   07/23/12 0742 07/23/12 1039  Weight: 153 lb (69.4 kg) 153 lb (69.4 kg)    PHYSICAL EXAM  General: Pleasant, NAD. Neuro: Alert and oriented X 3. Moves all extremities spontaneously. Psych: Normal affect. HEENT:  Normal  Neck: Supple without bruits or JVD. Lungs:  Resp regular and unlabored, CTA. Decreased breath sounds in left base Heart: RRR no s3, s4, or murmurs. Abdomen: Soft, non-tender, non-distended, BS + x 4.  Extremities: No clubbing, cyanosis , minimal edema  DP/PT/Radials 2+ and equal bilaterally.  Accessory Clinical Findings  CBC  Basename 07/24/12 0615 07/23/12 1130  WBC 4.9 5.2  NEUTROABS 3.2 --  HGB 9.1* 9.5*  HCT 29.3* 30.6*  MCV 87.2 86.9  PLT 151 158   Basic Metabolic Panel  Basename 07/24/12 0615 07/23/12 1130 07/23/12 0433  NA 140 -- 136  K 3.7 -- 4.0  CL 100 -- 100  CO2 30 -- 29  GLUCOSE 119* -- 143*  BUN 35* -- 32*  CREATININE 2.00* 1.91* --  CALCIUM 9.0 -- 9.1  MG -- -- --  PHOS -- -- --   Liver Function Tests  [facility] 07/23/12 0433  AST 14  ALT 12  ALKPHOS 69  BILITOT 0.6  PROT 6.5  ALBUMIN 2.9*   No results found for this basename: LIPASE:2,AMYLASE:2 in the last 72 hours Cardiac Enzymes  Basename 07/23/12 1130 07/23/12 0433  CKTOTAL 38 --  CKMB 2.3 --  CKMBINDEX -- --  TROPONINI <0.30 <0.30   BNP No components found with this basename: POCBNP:3 D-Dimer No results found for this basename: DDIMER:2 in the last 72 hours Hemoglobin A1C No results found for this basename: HGBA1C in the last 72  hours Fasting Lipid Panel No results found for this basename: CHOL,HDL,LDLCALC,TRIG,CHOLHDL,LDLDIRECT in the last 72 hours Thyroid Function Tests No results found for this basename: TSH,T4TOTAL,FREET3,T3FREE,THYROIDAB in the last 72 hours  TELE  Normal sinus rhythm  ECG   Radiology/Studies  Ct Head Wo Contrast  07/12/2012  *RADIOLOGY REPORT*  Clinical Data: Hemipareses.  History of stroke.  CT HEAD WITHOUT CONTRAST  Technique:  Contiguous axial images were obtained from the base of the skull through the vertex without contrast.  Comparison: Report from examinations 2001 and 2004.  Actual images no longer available.  Findings: The brain shows mild generalized atrophy, not advanced for age.  There is no evidence of old or acute focal infarction. No mass lesion, hemorrhage, hydrocephalus or extra-axial collection.  There is atherosclerotic calcification of the major vessels at the base of the brain.   Calvarium is unremarkable. Sinuses, middle ears and mastoids are clear with chronic sclerosis of the left mastoid region.  IMPRESSION: No acute or focal finding.  Mild age related atrophy, not advanced for age.  Original Report Authenticated By: Thomasenia Sales, M.D.   Dg Chest Port 1 View  07/23/2012  *RADIOLOGY REPORT*  Clinical Data: Short of breath.  PORTABLE CHEST - 1 VIEW  Comparison: 04/23/2012.  Findings: Increasing bilateral pleural effusions, left moderate and right small.  Diffuse airspace disease is present with perihilar and basilar predominant distribution compatible with CHF. Compressive atelectasis of the left lower lobe.  Median sternotomy and aortic valve replacement. Monitoring leads are projected over the chest.  IMPRESSION: Moderate CHF.  Moderate left pleural effusion with compressive atelectasis.  Original Report Authenticated By: Andreas Newport, M.D.    ASSESSMENT AND PLAN     He is feeling better after diuresis. Thoracentesis should help significantly. With his creatinine elevated, will cut his Lasix back to 40 mg IV every morning. Follow renal function. Venous Doppler is pending.  Signed, Valera Castle MD

## 2012-07-24 NOTE — Anesthesia Postprocedure Evaluation (Signed)
  Anesthesia Post-op Note  Patient: [name] [name]  Procedure(s) Performed: Procedure(s) (LRB): INSERTION PLEURAL DRAINAGE CATHETER (Left)  Patient Location: PACU  Anesthesia Type: MAC  Level of Consciousness: awake, alert  and oriented  Airway and Oxygen Therapy: Patient Spontanous Breathing and Patient connected to nasal cannula oxygen  Post-op Pain: none  Post-op Assessment: Post-op Vital signs reviewed  Post-op Vital Signs: Reviewed  Complications: No apparent anesthesia complications

## 2012-07-24 NOTE — Progress Notes (Signed)
Patient transferred to OR for thorancetesis procedure via stretcher. Nurse went down with patient to OR. Family present during time of transfer.

## 2012-07-24 NOTE — Anesthesia Procedure Notes (Signed)
Procedure Name: MAC Date/Time: 07/24/2012 3:40 PM Performed by: Alanda Amass A Pre-anesthesia Checklist: Patient identified, Emergency Drugs available, Suction available, Patient being monitored and Timeout performed Oxygen Delivery Method: Simple face mask

## 2012-07-24 NOTE — Brief Op Note (Signed)
07/23/2012 - 07/24/2012  4:19 PM  PATIENT:  [name] [name]  78 y.o. male  PRE-OPERATIVE DIAGNOSIS:  pleural effusion  POST-OPERATIVE DIAGNOSIS:  pleural effusion  PROCEDURE:  Procedure(s) (LRB): INSERTION PLEURAL DRAINAGE CATHETER (Left)  SURGEON:  Surgeon(s) and Role:    * [name], MD - Primary  PHYSICIAN ASSISTANT: none  ASSISTANTS: none   ANESTHESIA:   local and IV sedation   1700 cc serous pleural fluid:  Sent for cytology   EBL:  Total I/O In: 250 [I.V.:250] Out: 155 [Urine:150; Blood:5]  BLOOD ADMINISTERED:none  DRAINS: none   LOCAL MEDICATIONS USED:  LIDOCAINE   PATIENT DISPOSITION:  PACU - hemodynamically stable.   Delay start of Pharmacological VTE agent (>24hrs) due to surgical blood loss or risk of bleeding: yes

## 2012-07-24 NOTE — Progress Notes (Signed)
Pharmacist Heart Failure Core Measure Documentation  Assessment: [name] [name] has an EF documented as 25-30% on 02/07/2012 by ECHO.  Rationale: Heart failure patients with left ventricular systolic dysfunction (LVSD) and an EF < 40% should be prescribed an angiotensin converting enzyme inhibitor (ACEI) or angiotensin receptor blocker (ARB) at discharge unless a contraindication is documented in the medical record.  This patient is not currently on an ACEI or ARB for HF.  This note is being placed in the record in order to provide documentation that a contraindication to the use of these agents is present for this encounter.  ACE Inhibitor or Angiotensin Receptor Blocker is contraindicated (specify all that apply)  []   ACEI allergy AND ARB allergy []   Angioedema []   Moderate or severe aortic stenosis []   Hyperkalemia []   Hypotension []   Renal artery stenosis [x]   Worsening renal function, preexisting renal disease or dysfunction   [name] [name] 07/24/2012 1:29 PM

## 2012-07-24 NOTE — Transfer of Care (Signed)
Immediate Anesthesia Transfer of Care Note  Patient: [name] [name]  Procedure(s) Performed: Procedure(s) (LRB): INSERTION PLEURAL DRAINAGE CATHETER (Left)  Patient Location: PACU  Anesthesia Type: MAC  Level of Consciousness: sedated  Airway & Oxygen Therapy: Patient Spontanous Breathing  Post-op Assessment: Report given to PACU RN  Post vital signs: Reviewed and stable  Complications: No apparent anesthesia complications

## 2012-07-24 NOTE — Anesthesia Preprocedure Evaluation (Addendum)
Anesthesia Evaluation  Patient identified by MRN, date of birth, ID band Patient awake    Reviewed: Allergy & Precautions, H&P , NPO status , Patient's Chart, lab work & pertinent test results, reviewed documented beta blocker date and time   Airway Mallampati: I TM Distance: >3 FB Neck ROM: Full    Dental  (+) Missing and Dental Advisory Given   Pulmonary  breath sounds clear to auscultation        Cardiovascular hypertension, Pt. on home beta blockers and Pt. on medications + CAD and + Past MI Rhythm:Regular Rate:Normal     Neuro/Psych    GI/Hepatic   Endo/Other    Renal/GU Renal InsufficiencyRenal disease     Musculoskeletal   Abdominal   Peds  Hematology   Anesthesia Other Findings   Reproductive/Obstetrics                          Anesthesia Physical Anesthesia Plan  ASA: IV  Anesthesia Plan: MAC   Post-op Pain Management:    Induction: Intravenous  Airway Management Planned: Simple Face Mask  Additional Equipment:   Intra-op Plan:   Post-operative Plan: Extubation in OR  Informed Consent: I have reviewed the patients History and Physical, chart, labs and discussed the procedure including the risks, benefits and alternatives for the proposed anesthesia with the patient or authorized representative who has indicated his/her understanding and acceptance.   Dental advisory given  Plan Discussed with: CRNA, Anesthesiologist and Surgeon  Anesthesia Plan Comments:         Anesthesia Quick Evaluation

## 2012-07-25 ENCOUNTER — Encounter (HOSPITAL_COMMUNITY): Payer: Self-pay | Admitting: Surgery

## 2012-07-25 DIAGNOSIS — I5022 Chronic systolic (congestive) heart failure: Secondary | ICD-10-CM | POA: Diagnosis not present

## 2012-07-25 LAB — GLUCOSE, CAPILLARY
Glucose-Capillary: 152 mg/dL — ABNORMAL HIGH (ref 70–99)
Glucose-Capillary: 283 mg/dL — ABNORMAL HIGH (ref 70–99)

## 2012-07-25 LAB — BASIC METABOLIC PANEL
BUN: 34 mg/dL — ABNORMAL HIGH (ref 6–23)
Calcium: 9.1 mg/dL (ref 8.4–10.5)
GFR calc non Af Amer: 29 mL/min — ABNORMAL LOW (ref >90)
Glucose, Bld: 163 mg/dL — ABNORMAL HIGH (ref 70–99)

## 2012-07-25 MED ORDER — FUROSEMIDE 40 MG PO TABS
40.0000 mg | ORAL_TABLET | Freq: Every day | ORAL | Status: DC
  Administered 2012-07-25 – 2012-07-26 (×2): 40 mg via ORAL
  Filled 2012-07-25 (×2): qty 1

## 2012-07-25 NOTE — Progress Notes (Signed)
1 Day Post-Op Procedure(s) (LRB): INSERTION PLEURAL DRAINAGE CATHETER (Left)  Subjective: No complaints, slept well. Feels much better. Ambulated today  Objective: Vital signs in last 24 hours: Temp:  [97.7 F (36.5 C)-98 F (36.7 C)] 97.8 F (36.6 C) (08/15 1413) Pulse Rate:  [59-68] 65  (08/15 1700) Cardiac Rhythm:  [-]  Resp:  [18-20] 18  (08/15 1413) BP: (123-151)/(38-58) 125/45 mmHg (08/15 1700) SpO2:  [94 %-100 %] 94 % (08/15 1413) Weight:  [64.093 kg (141 lb 4.8 oz)] 64.093 kg (141 lb 4.8 oz) (08/15 0449)  Hemodynamic parameters for last 24 hours:    Intake/Output from previous day: 08/14 0701 - 08/15 0700 In: 1000 [I.V.:1000] Out: 1855 [Urine:1850; Blood:5] Intake/Output this shift:    General appearance: alert and cooperative Heart: regular rate and rhythm, systolic murmur at apex Lungs: clear to auscultation bilaterally  Lab Results:  [facility] 07/24/12 0615 07/23/12 1130  WBC 4.9 5.2  HGB 9.1* 9.5*  HCT 29.3* 30.6*  PLT 151 158   BMET:  Basename 07/25/12 0600 07/24/12 0615  NA 139 140  K 3.7 3.7  CL 98 100  CO2 32 30  GLUCOSE 163* 119*  BUN 34* 35*  CREATININE 1.97* 2.00*  CALCIUM 9.1 9.0    PT/INR:  Basename 07/24/12 0615  LABPROT 16.9*  INR 1.35   ABG    Component Value Date/Time   PHART 7.502* 07/23/2012 0509   HCO3 28.6* 07/23/2012 0509   TCO2 30 07/23/2012 0509   ACIDBASEDEF 1.0 02/07/2012 2227   O2SAT 99.0 07/23/2012 0509   CBG (last 3)   Basename 07/25/12 1644 07/25/12 1113 07/25/12 0604  GLUCAP 143* 283* 152*    Assessment/Plan: S/P Procedure(s) (LRB): INSERTION PLEURAL DRAINAGE CATHETER (Left) Pleurx catheter not drained today.  We will drain it tomorrow. When he goes home he will need HHRN to drain the catheter daily and I want to see him in the office in a week with a cxr.   LOS: 2 days    Evelene Croon K 07/25/2012

## 2012-07-25 NOTE — Progress Notes (Signed)
Inpatient Diabetes Program Recommendations  AACE/ADA: New Consensus Statement on Inpatient Glycemic Control  Target Ranges:  Prepandial:   less than 140 mg/dL      Peak postprandial:   less than 180 mg/dL (1-2 hours)      Critically ill patients:  140 - 180 mg/dL  Pager:  [phone] Hours:  8 am-10pm   Reason for Visit: Elevated prandial glucose:  152, 283 mg/dL  Inpatient Diabetes Program Recommendations  Insulin - Meal Coverage: May consider adding Novolog 3 units TID with meals  Diet: Add CHO modified medium to diet    Alfredia Client PhD, RN, BC-ADM Diabetes Coordinator  Office:  [phone] Team Pager:  [phone]

## 2012-07-25 NOTE — Op Note (Signed)
[name] [name] NO.:  [id number]  MEDICAL RECORD NO.:  [id number]  LOCATION:  4709                         FACILITY:  [facility]  PHYSICIAN:  Evelene Croon, M.D.     DATE OF BIRTH:  03-10-1926  DATE OF PROCEDURE:  07/24/2012 DATE OF DISCHARGE:                              OPERATIVE REPORT   PREOPERATIVE DIAGNOSIS:  Recurrent left pleural effusion.  POSTOPERATIVE DIAGNOSIS:  Recurrent left pleural effusion.  OPERATIVE PROCEDURE:  Insertion of left PleurX catheter.  ATTENDING SURGEON:  Evelene Croon, MD  ANESTHESIA:  Local with IV sedation.  ESTIMATED BLOOD LOSS:  Zero.  CLINICAL HISTORY:  This patient is an 78 year old gentleman who underwent aortic valve replacement and coronary artery bypass surgery by me in February 2013.  He had a left pleural effusion that developed postoperatively and had to have a left thoracentesis about 2 months postoperatively.  He had partial recurrence of this left pleural effusion, but remains stable without symptoms and now presents with worsening shortness of breath and generalized weakness with a moderate- to-large left pleural effusion and left lower lobe atelectasis.  He also has small right pleural effusion.  His BNP was 30,000.  I was asked to see him for management of the left pleural effusion and I felt that this was most likely due to congestive heart failure and would be best managed with insertion of a PleurX catheter since this effusion had recurred.  I discussed the operative procedure with the patient including alternatives, benefits, and risks including, but not limited to bleeding, infection, injury to the lung, pneumothorax, malfunction of the catheter, and recurrence of the effusion.  He understood all of this and agreed to proceed.  OPERATIVE PROCEDURE:  The patient was seen in the preoperative holding area, and the proper patient, proper operation, proper operative side were confirmed after reviewing his  x-rays.  Left-sided chest was signed by me.  Preoperative intravenous Ancef was given.  The patient was then taken back to the operating room, placed on the table in supine position.  After intravenous sedation under the care of Anesthesiology, a roll was placed beneath the left side of the back to slightly raised the left side of the chest.  Then, the left side of the chest was prepped with Betadine soap and solution, draped in a usual sterile manner.  Time-out was taken.  Proper patient, proper operation, proper operative side were confirmed with nursing and anesthesia staff.  Then, 1% lidocaine local anesthesia was used to anesthetize the skin and subcutaneous tissue in the left midaxillary line at approximately the eighth intercostal space.  A small incision was made about 5-mm in length and then a needle catheter was used to enter the left pleural space.  There was immediate return of serous fluid.  The needle was removed and a guidewire advanced through the catheter into the left pleural space.  Proper position was confirmed by fluoroscopy.  Then, a site was chosen for the catheter to exit anteriorly just above the left costal margin.  A 5-mm incision was made at this location after anesthetize the skin and subcutaneous tissue with 1% lidocaine local anesthesia.  Then, the subcutaneous tissue between the two  incisions was anesthetized with 1% lidocaine local anesthesia to allow tunneling of the catheter.  The catheter was then cut to the appropriate length and tunneled from the skin exit site to the midaxillary entry site using the provided tunneler.  It was positioned, so the cuff on the catheter was just inside the skin exit site.  Then, the peel-away introducer and sheath were inserted into the left pleural space over the guidewire and the guidewire and introducer were removed.  The catheter was then inserted into the left pleural space through the peel-away sheath and the sheath  was removed.  The fluoroscopy was performed to confirm proper position of the catheter within the left pleural space in a basilar location.  The catheter was then sewn at the exit site to the skin using a 3-0 nylon suture.  The chest entry site was closed with a single 3-0 Vicryl subcuticular stitch.  The catheter was then connected to vacuum bottle and 1700 mL of yellow serous fluid was removed.  This was sent for cytology.  The catheter was then capped.  A dry sterile dressing was applied around the catheter and then it was cover with a waterproof dressing.  The catheter was coiled and taped to the dressing.  The patient tolerated the procedure well and was then transported to the postanesthesia Care unit in satisfactory, stable condition.     Evelene Croon, M.D.     BB/MEDQ  D:  07/24/2012  T:  07/25/2012  Job:  [id number]

## 2012-07-25 NOTE — Progress Notes (Signed)
Patient ID: [name] [name], male   DOB: 11/16/1926, 78 y.o.   MRN: [id number]   Patient Name: [name] [name] Date of Encounter: 07/25/2012    SUBJECTIVE No shortness of breath after diuresis and thoracentesis this morning. 1700 cc of serous fluid was removed. Small tube still in place. Renal function is stable with cut back on Lasix yesterday. Venous Dopplers did not show DVT an incidental Baker's cyst on the left. The right leg was one was bothering him on admission. It has gotten much better with diuresis. He has no discomfort in his left leg.  CURRENT MEDS    . aspirin EC  325 mg Oral Daily  . carvedilol  25 mg Oral BID WC  .  ceFAZolin (ANCEF) IV  1 g Intravenous On Call to OR  . cholecalciferol  1,000 Units Oral Daily  . furosemide  40 mg Intravenous Daily  . insulin aspart  0-9 Units Subcutaneous TID WC  . multivitamin with minerals  1 tablet Oral Daily  . pantoprazole  40 mg Oral Q1200  . sodium chloride  3 mL Intravenous Q12H  . Tamsulosin HCl  0.4 mg Oral QPC supper  . Travoprost (BAK Free)  1 drop Both Eyes BID  . DISCONTD: simvastatin  10 mg Oral QHS    OBJECTIVE  Filed Vitals:   07/24/12 1734 07/24/12 1908 07/24/12 2059 07/25/12 0449  BP:  172/60 123/38 143/55  Pulse:  57 59 68  Temp: 97 F (36.1 C)  97.7 F (36.5 C) 98 F (36.7 C)  TempSrc:   Oral Oral  Resp:  20 20 18   Height:      Weight:    141 lb 4.8 oz (64.093 kg)  SpO2:  96% 99% 100%    Intake/Output Summary (Last 24 hours) at 07/25/12 0932 Last data filed at 07/25/12 0924  Gross per 24 hour  Intake   1240 ml  Output   1930 ml  Net   -690 ml   Filed Weights   07/23/12 0742 07/23/12 1039 07/25/12 0449  Weight: 153 lb (69.4 kg) 153 lb (69.4 kg) 141 lb 4.8 oz (64.093 kg)    PHYSICAL EXAM  General: Pleasant, NAD. Neuro: Alert and oriented X 3. Moves all extremities spontaneously. Psych: Normal affect. HEENT:  Normal  Neck: Supple without bruits or JVD. Lungs:  Resp regular and unlabored,  CTA. Heart: RRR no s3, s4, or murmurs. Abdomen: Soft, non-tender, non-distended, BS + x 4.  Extremities: No clubbing, cyanosis , minimal pitting edema at the ankles. No tenderness in either popliteal fossa. DP/PT/Radials 2+ and equal bilaterally.  Accessory Clinical Findings  CBC  Basename 07/24/12 0615 07/23/12 1130  WBC 4.9 5.2  NEUTROABS 3.2 --  HGB 9.1* 9.5*  HCT 29.3* 30.6*  MCV 87.2 86.9  PLT 151 158   Basic Metabolic Panel  Basename 07/25/12 0600 07/24/12 0615  NA 139 140  K 3.7 3.7  CL 98 100  CO2 32 30  GLUCOSE 163* 119*  BUN 34* 35*  CREATININE 1.97* 2.00*  CALCIUM 9.1 9.0  MG -- --  PHOS -- --   Liver Function Tests  [facility] 07/23/12 0433  AST 14  ALT 12  ALKPHOS 69  BILITOT 0.6  PROT 6.5  ALBUMIN 2.9*   No results found for this basename: LIPASE:2,AMYLASE:2 in the last 72 hours Cardiac Enzymes  Basename 07/23/12 1130 07/23/12 0433  CKTOTAL 38 --  CKMB 2.3 --  CKMBINDEX -- --  TROPONINI <0.30 <0.30   BNP No  components found with this basename: POCBNP:3 D-Dimer No results found for this basename: DDIMER:2 in the last 72 hours Hemoglobin A1C No results found for this basename: HGBA1C in the last 72 hours Fasting Lipid Panel No results found for this basename: CHOL,HDL,LDLCALC,TRIG,CHOLHDL,LDLDIRECT in the last 72 hours Thyroid Function Tests No results found for this basename: TSH,T4TOTAL,FREET3,T3FREE,THYROIDAB in the last 72 hours  TELE  Normal sinus rhythm  ECG    Radiology/Studies  Ct Head Wo Contrast  07/12/2012  *RADIOLOGY REPORT*  Clinical Data: Hemipareses.  History of stroke.  CT HEAD WITHOUT CONTRAST  Technique:  Contiguous axial images were obtained from the base of the skull through the vertex without contrast.  Comparison: Report from examinations 2001 and 2004.  Actual images no longer available.  Findings: The brain shows mild generalized atrophy, not advanced for age.  There is no evidence of old or acute focal  infarction. No mass lesion, hemorrhage, hydrocephalus or extra-axial collection.  There is atherosclerotic calcification of the major vessels at the base of the brain.  Calvarium is unremarkable. Sinuses, middle ears and mastoids are clear with chronic sclerosis of the left mastoid region.  IMPRESSION: No acute or focal finding.  Mild age related atrophy, not advanced for age.  Original Report Authenticated By: Thomasenia Sales, M.D.   Dg Chest Port 1 View  07/24/2012  *RADIOLOGY REPORT*  Clinical Data: 77 year old male status post pleural catheter placement.  PORTABLE CHEST - 1 VIEW  Comparison: 07/23/2012 and earlier.  Findings: Portable supine AP view 1640 hours.  Small caliber left pleural drain in place.  Virtually resolved previously seen moderate to large left pleural effusion.  Small pneumothorax identified at the left base, pleural edge visible at the costophrenic sulcus.  Small volume subcutaneous gas.   Stable cardiac size and mediastinal contours.  Sequelae of cardiac valve replacement.  Veiling opacity on the right compatible with small pleural effusion. Visualized tracheal air column is within normal limits.  IMPRESSION: 1.  Resolved left pleural effusion with small pneumothorax status post left pleural catheter placement. 2.  Veiling right pleural effusion.  Original Report Authenticated By: Harley Hallmark, M.D.   Dg Chest Port 1 View  07/23/2012  *RADIOLOGY REPORT*  Clinical Data: Short of breath.  PORTABLE CHEST - 1 VIEW  Comparison: 04/23/2012.  Findings: Increasing bilateral pleural effusions, left moderate and right small.  Diffuse airspace disease is present with perihilar and basilar predominant distribution compatible with CHF. Compressive atelectasis of the left lower lobe.  Median sternotomy and aortic valve replacement. Monitoring leads are projected over the chest.  IMPRESSION: Moderate CHF.  Moderate left pleural effusion with compressive atelectasis.  Original Report Authenticated  By: Andreas Newport, M.D.   Dg C-arm 1-60 Min-no Report  07/24/2012  CLINICAL DATA: intraop OR 17   C-ARM 1-60 MINUTES  Fluoroscopy was utilized by the requesting physician.  No radiographic  interpretation.      ASSESSMENT AND PLAN     He presented with acute on chronic systolic heart failure. He is diuresed his renal function stabilized. He was on 40 mg of Lasix by mouth on admission. Much of his deterioration and symptoms were due to large left pleural effusion. He is status post thoracentesis this morning with chest tube remaining.  We decrease his Lasix to 40 mg by mouth this morning. Check electrolytes and renal function morning. He may be able to go home tomorrow.  I do not think his leg pain was from his statin. We'll restart at discharge.  Signed, Valera Castle MD

## 2012-07-26 ENCOUNTER — Encounter (HOSPITAL_COMMUNITY): Payer: Self-pay | Admitting: Physician Assistant

## 2012-07-26 ENCOUNTER — Other Ambulatory Visit: Payer: Self-pay | Admitting: Surgery

## 2012-07-26 DIAGNOSIS — I5023 Acute on chronic systolic (congestive) heart failure: Secondary | ICD-10-CM

## 2012-07-26 DIAGNOSIS — J90 Pleural effusion, not elsewhere classified: Secondary | ICD-10-CM

## 2012-07-26 DIAGNOSIS — I5022 Chronic systolic (congestive) heart failure: Secondary | ICD-10-CM | POA: Diagnosis not present

## 2012-07-26 LAB — BASIC METABOLIC PANEL
BUN: 30 mg/dL — ABNORMAL HIGH (ref 6–23)
CO2: 24 mEq/L (ref 19–32)
GFR calc non Af Amer: 31 mL/min — ABNORMAL LOW (ref >90)
Glucose, Bld: 126 mg/dL — ABNORMAL HIGH (ref 70–99)
Potassium: 4.3 mEq/L (ref 3.5–5.1)

## 2012-07-26 LAB — GLUCOSE, CAPILLARY
Glucose-Capillary: 201 mg/dL — ABNORMAL HIGH (ref 70–99)
Glucose-Capillary: 222 mg/dL — ABNORMAL HIGH (ref 70–99)

## 2012-07-26 NOTE — Discharge Summary (Signed)
Discharge Summary   Patient ID: [name] [name],  MRN: [id number], DOB/AGE: [birth year] 78 y.o.  Admit date: 07/23/2012 Discharge date: 07/26/2012  Primary Physician: Bufford Spikes, DO Primary Cardiologist: Juanito Doom, MD  Discharge Diagnoses Principal Problem:  *Acute on chronic systolic CHF (congestive heart failure) Active Problems:  Hypertension  Acute on chronic renal failure  Anemia  CAD  HLD (hyperlipidemia)  Cellulitis and abscess of leg  Pleural effusion   Allergies No Known Allergies  Diagnostic Studies/Procedures  PORTABLE CHEST X-RAY - 07/23/12  PORTABLE CHEST - 1 VIEW  Comparison: 04/23/2012.  Findings: Increasing bilateral pleural effusions, left moderate and  right small. Diffuse airspace disease is present with perihilar  and basilar predominant distribution compatible with CHF.  Compressive atelectasis of the left lower lobe. Median sternotomy  and aortic valve replacement. Monitoring leads are projected over  the chest.  IMPRESSION:  Moderate CHF. Moderate left pleural effusion with compressive  Atelectasis.  LOWER EXTREMITY VENOUS DUPLEX - 07/23/12  - No evidence of deep vein thrombosis involving the right lower extremity and left lower extremity. - Unable to visualize a portion of the right posterior tibial vein, therefore deep vein thrombosis cannot be excluded in this segment. Incidental findings are consistent with: Baker's Cyst on the left.  PORTABLE X-RAY - 07/24/12  Comparison: 07/23/2012 and earlier.  Findings: Portable supine AP view 1640 hours. Small caliber left  pleural drain in place. Virtually resolved previously seen  moderate to large left pleural effusion. Small pneumothorax  identified at the left base, pleural edge visible at the  costophrenic sulcus. Small volume subcutaneous gas.  Stable cardiac size and mediastinal contours. Sequelae of cardiac  valve replacement. Veiling opacity on the right compatible with  small  pleural effusion. Visualized tracheal air column is within  normal limits.  IMPRESSION:  1. Resolved left pleural effusion with small pneumothorax status  post left pleural catheter placement.  2. Veiling right pleural effusion.  INSERTION OF LEFT PLEURX CATHETER - 07/24/12  Successful placement of left PleurX catheter to left pleural effusion space.   History of Present Illness  [name] [name] is a 78yo Caucasian male with the above problem list who was admitted to [facility] on 07/23/12 for acute on chronic systolic CHF with resultant moderate pleural effusion.   He has a history of CAD and a prior history of AS and underwent CABG/pericardial AVR in 01/2012 c/b L pleural effusion requiring thoracentesis and RLE cellulitis (+ serratia) in 03/2012. He had followed up with Dr. Daleen Squibb in 05/2012, and was felt to be stable. He reported a functinoal decline since his surgery with a more recent endorsement of progressive weakness, dyspnea on exertion, orthopnea and LEE. He did note occasional, intermittent episodes of respiratory distress described as "gasping for air." He noted improvement with sitting in a recliner. He denied chest pain, palpitations, fevers, cough or chills. There was no endorsement of increased dietary sodium. He did undergo noncontrast head CT by his PCP earlier this month revealing no acute abnormality. Given his worsening shortness of breath, he presented to [facility] ED.   There, pBNP was [place] [address]. CXR revealing moderate CHF, moderate L pleural effusion with compressive atelectasis. Baseline renal dysfunction was appreciated. He was found to have a normocytic anemia consistent with prior CBCs. No leukocytosis. Trop-I WNL. He was administered Lasix IV in the ED with some improvement in symptoms. The patient was admitted with acute on chronic systolic CHF.   Hospital Course  There was noted  RLE edema for which LE venous dopplers were ordered revealing no  evidence of DVT, but did indicate an incidental finding of Baker's cyst on the left. The patient's CHF exacerbation was felt to be progressive without a clear etiology. There is low suspicion of cardiac ischemia, BP remained well-controlled, the patient denied dietary indiscretion or medication noncompliance. The patient had a noted EF 25-30% on echocardiography in 01/2012. A TCTS consultation was requested regarding the patient's recurrent left pleural effusion with resultant compressive atelectasis. The recommendation was made for chest tube placement for therapeutic alleviation. As above, this was successful with an initial 1700 cc's of serous, transudative pleural fluid drawn.   Meanwhile, the patient was diuresed with Lasix IV. He did develop transient acute on CKD secondary to this. Lasix was down-titrated, held and eventually transitioned back to his outpatient dosing once his symptoms improved. ACEi/ARB therapy was not initiated given the patient's baseline CKD. He was continued on outpatient Coreg. He diuresed well (I/O -2165 cc, admission weight 153 lbs, discharge weight 145 lbs), there was good output from the chest tube and his symptoms improved.   There were no active signs of bleeding and H/H were followed throughout the admission with stable findings noted. The patient was noted to be hyperglycemic during the admission requiring SSI. There were no episodes of chest pain.   There was a concern of statin intolerance attributing to the patient's RLE discomfort and weakness. This was initially not felt to be contributing to the patient's symptoms, but will be held for 6 weeks with symptom monitoring. If symptoms do not improve, statin therapy will be re-initiated. He was assessed by Dr. Daleen Squibb today and felt to be stable for discharge given his CHF status, he will follow-up in the clinic in </= 7 days as scheduled below. Zocor will be held on discharge and for 6 weeks to evaluate for statin  intolerance. The patient will follow-up in 6 weeks with Dr. Daleen Squibb at which time further recommendations will be made. Additionally, the patient's EF is 25-30%. Consideration of repeat echo and implantation of an ICD for primary prevention if EF remains < 35% should be discussed as an outpatient. This has been conveyed upon scheduling for follow-up. He will follow-up with Dr. Laneta Simmers for chest tube removal in ~ 1 week after this was placed. CXR will be performed at this time. This is outlined below. He has a history of borderline DM and hyperglycemia this admission. He will follow-up with his PCP for this. Home health RN has been arranged for continued chest tube management until it is removed. This will also be drained by TCTS prior to discharge. This information, including supplemental heart failure and pleural effusion material, has been clearly outlined in the discharge AVS.   Discharge Vitals:  Blood pressure 139/51, pulse 67, temperature 98.2 F (36.8 C), temperature source Oral, resp. rate 16, height 5\' 9"  (1.753 m), weight 65.908 kg (145 lb 4.8 oz), SpO2 93.00%.   Weight change: 1.814 kg (4 lb)  Labs: Recent Labs  [facility] 07/24/12 0615   WBC 4.9   HGB 9.1*   HCT 29.3*   MCV 87.2   PLT 151    Lab 07/26/12 0629 07/25/12 0600 07/24/12 0615 07/23/12 0433  NA 136 139 140 --  K 4.3 3.7 3.7 --  CL 100 98 100 --  CO2 24 32 30 --  BUN 30* 34* 35* --  CREATININE 1.87* 1.97* 2.00* --  CALCIUM 8.8 9.1 9.0 --  PROT -- -- --  6.5  BILITOT -- -- -- 0.6  ALKPHOS -- -- -- 69  ALT -- -- -- 12  AST -- -- -- 14  AMYLASE -- -- -- --  LIPASE -- -- -- --  GLUCOSE 126* 163* 119* --   No results found for this basename: CKTOTAL:3,CKMB:3,CKMBINDEX:3,TROPONINI:3 in the last 72 hours Disposition:  Discharge Orders    Future Appointments: Provider: Department: Dept Phone: Center:   07/30/2012 9:00 AM [name], MD [facility] [phone] [facility]   07/31/2012 8:00 AM Rosalio Macadamia, NP [facility] [phone] None   09/06/2012 2:30 PM Gaylord Shih, MD [facility] [phone] [id number]     Follow-up Information    Follow up with [name], MD on 07/30/2012. (At 9:00 AM for chest tube removal. )    Contact information:   [address] [place] [address] [phone]       Follow up with Norma Fredrickson, NP on 07/31/2012. (At 8:00 AM for follow-up after this hospitalization. )    Contact information:   [address] [place] [address] [phone]       Schedule an appointment as soon as possible for a visit with [name], [name], DO. (In 1-2 weeks regarding elevated blood sugars and left leg Baker's cyst here in the hospital. )    Contact information:   [address] [place] [address] [phone]       Follow up with Valera Castle, MD on 09/06/2012. (At 2:30 PM with Dr. Daleen Squibb for follow-up and to discuss restarting a statin. )    Contact information:   [address] [place] [address] [phone]          Discharge Medications:  Medication List  As of 07/26/2012 12:22 PM   CONTINUE taking these medications         aspirin 325 MG EC tablet   Take 1 tablet (325 mg total) by mouth daily.      carvedilol 25 MG tablet   Commonly known as: COREG   TAKE 1 TABLET BY MOUTH TWICE DAILY WITH A MEAL      cholecalciferol 1000 UNITS tablet   Commonly known as: VITAMIN D      furosemide 40 MG tablet   Commonly known as: LASIX   TAKE 1 TABLET BY MOUTH ONCE DAILY      multivitamin capsule      nitroGLYCERIN 0.4 MG SL tablet   Commonly known as: NITROSTAT      omeprazole 20 MG capsule   Commonly known as: PRILOSEC      Tamsulosin HCl 0.4 MG Caps   Commonly known as: FLOMAX   TAKE 1 CAPSULE BY MOUTH ONCE DAILY AFTER SUPPER      Travoprost (BAK Free) 0.004 % Soln ophthalmic solution   Commonly known as: TRAVATAN          STOP taking these medications         simvastatin 10 MG tablet           Outstanding Labs/Studies: None  Duration of Discharge Encounter: Greater than 30 minutes including physician time.  Signed, R. Hurman Horn, PA-C 07/26/2012, 12:22 PM     Jesse Sans. Daleen Squibb, MD, [facility] Pager:  [phone]

## 2012-07-26 NOTE — Progress Notes (Signed)
[name] [name] to be D/C'd Home per MD order.  Discussed with the patient and all questions fully answered.   Dryden, Tapley  Home Medication Instructions [id number]   Printed on:07/26/12 1507  Medication Information                    omeprazole (PRILOSEC) 20 MG capsule Take 20 mg by mouth daily.             Multiple Vitamin (MULTIVITAMIN) capsule Take 1 capsule by mouth daily.             cholecalciferol (VITAMIN D) 1000 UNITS tablet Take 1,000 Units by mouth daily.            aspirin EC 325 MG EC tablet Take 1 tablet (325 mg total) by mouth daily.           nitroGLYCERIN (NITROSTAT) 0.4 MG SL tablet Place 0.4 mg under the tongue every 5 (five) minutes x 3 doses as needed. For chest pain           furosemide (LASIX) 40 MG tablet TAKE 1 TABLET BY MOUTH ONCE DAILY           Tamsulosin HCl (FLOMAX) 0.4 MG CAPS TAKE 1 CAPSULE BY MOUTH ONCE DAILY AFTER SUPPER           carvedilol (COREG) 25 MG tablet TAKE 1 TABLET BY MOUTH TWICE DAILY WITH A MEAL           Travoprost, BAK Free, (TRAVATAN) 0.004 % SOLN ophthalmic solution Place 1 drop into both eyes 2 (two) times daily.             VVS, Skin clean, dry and intact without evidence of skin break down, no evidence of skin tears noted. IV catheter discontinued intact. Site without signs and symptoms of complications. Dressing and pressure applied.  An After Visit Summary was printed and given to the patient. Patient escorted via WC, and D/C home via private auto with wife.  [name] [name] 07/26/2012 3:07 PM

## 2012-07-26 NOTE — Progress Notes (Signed)
                    [address]            [place] [address]          [phone]     2 Days Post-Op Procedure(s) (LRB): INSERTION PLEURAL DRAINAGE CATHETER (Left)  Subjective: Feels well.  Mild SOB with exertion.  Objective: Vital signs in last 24 hours: Patient Vitals for the past 24 hrs:  BP Temp Temp src Pulse Resp SpO2 Weight  07/26/12 0535 139/51 mmHg 98.2 F (36.8 C) Oral 67  16  93 % 145 lb 4.8 oz (65.908 kg)  07/25/12 2135 152/56 mmHg 99.4 F (37.4 C) Oral 66  20  96 % -  07/25/12 1700 125/45 mmHg - - 65  - - -  07/25/12 1413 124/46 mmHg 97.8 F (36.6 C) Oral 61  18  94 % -  07/25/12 1200 151/58 mmHg - - 61  - - -   Current Weight  07/26/12 145 lb 4.8 oz (65.908 kg)     Intake/Output from previous day: 08/15 0701 - 08/16 0700 In: 960 [P.O.:960] Out: 725 [Urine:725]    PHYSICAL EXAM:  Heart: RRR Lungs: slightly decreased BS in left base Wound: PleuRx catheter site clean and dry   Lab Results: CBC: Basename 07/24/12 0615 07/23/12 1130  WBC 4.9 5.2  HGB 9.1* 9.5*  HCT 29.3* 30.6*  PLT 151 158   BMET:  Basename 07/26/12 0629 07/25/12 0600  NA 136 139  K 4.3 3.7  CL 100 98  CO2 24 32  GLUCOSE 126* 163*  BUN 30* 34*  CREATININE 1.87* 1.97*  CALCIUM 8.8 9.1    PT/INR:  Basename 07/24/12 0615  LABPROT 16.9*  INR 1.35      Assessment/Plan: S/P Procedure(s) (LRB): INSERTION PLEURAL DRAINAGE CATHETER (Left) I drained the PleuRx catheter this morning and it yielded approximately 500 ml of straw colored fluid.  He will need to be drained daily at home. OK to discharge from our standpoint.  Follow up in 1 week with Dr. Laneta Simmers with chest x-ray.   LOS: 3 days    Deidre Carino H 07/26/2012

## 2012-07-26 NOTE — Progress Notes (Signed)
HOME HEALTH AGENCIES SERVING [place]   Agencies that are Medicare-Certified and are affiliated with [facility]  Telephone Number Address  [facility]   The [facility] has ownership interest in this company; however, you are under no obligation to use this agency. [phone] or  [phone] [address] [place], [place] [address]   Agencies that are Medicare-Certified and are not affiliated with [facility] Telephone Number Address  [facility] [phone] Fax [phone] [address] [place], [place]  [address]  [facility] [phone] or [phone] Fax [phone] [address] [place], [place] [address]  [facility] [phone] Fax [phone] [address] [place], [place] [address]  [facility] [phone] Fax [phone] [address] [place], [place]  [address]  [facility] [phone] Fax [phone] [address], Suite [place], [place] [address]  [facility] [phone] [address] [place], [place] [address]  [facility] [phone]  [address] [place], [place] [address]  [facility] [phone] or [phone] Fax [phone] [address] [place], [place]  [address]  [facility] [phone] Fax [phone] [address] [place], [place]  [address]  [facility]  [phone] Fax [phone] [address] [place], [place] [address]               Agencies that are not Medicare-Certified and are not affiliated with [facility] Agency Telephone Number Address  [facility] [place] [phone] or [phone] Fax [phone] [address] [place]  [address]  [facility] [phone] Fax [phone] [address] [place], [place]  [address]  [facility]  [phone] Fax [phone] [address] [place], [place] [address]  [facility] [phone] Fax [phone] [address] [place], [place] [address]  [facility] [phone] or [phone] Fax [phone] [address] [place], [place]  [address]  [facility] [phone] or [phone] Fax [phone] [address], Suite [place], [place]  [address]  [facility] [phone] Fax [phone] [address] [place], [place]  [address]  [facility] [phone] [address] [place], [place]  [address]  [facility] [phone] Fax [phone] [address] [place], [place]  [address]  [facility] [phone] Fax [phone] [address] [place], [place]  [address]  [facility] [phone] Fax [phone] [address] [place], [place] [address]  [facility] [phone] Fax [phone] [address] [place], [place]  [address]   In to see patient and wife regarding discharge with home health services. Choice of agencies given to patient. Reports  having used [facility] in the past and being pleased with services. Wishes to use that agency again. Appropriate referrals will be made.

## 2012-07-26 NOTE — Progress Notes (Signed)
Patient ID: [name] [name], male   DOB: 04-10-1926, 78 y.o.   MRN: [id number]   Patient Name: [name] [name] Date of Encounter: 07/26/2012    SUBJECTIVE  [name] feels better this morning but has had little shortness of breath during the night. Suggested we drain today for a good time. Input is positive, creatinine is improved.  Daughter is here at bedside. She's physical therapist. She has noted deterioration in his right leg of the last month or so. His CT scan ordered by his primary care which did not show any new stroke. He had a remote stroke in the past. He did affect his right side. He offers no other complaints of TIAs or threatened stroke.    CURRENT MEDS    . aspirin EC  325 mg Oral Daily  . carvedilol  25 mg Oral BID WC  . cholecalciferol  1,000 Units Oral Daily  . furosemide  40 mg Oral Daily  . insulin aspart  0-9 Units Subcutaneous TID WC  . multivitamin with minerals  1 tablet Oral Daily  . pantoprazole  40 mg Oral Q1200  . sodium chloride  3 mL Intravenous Q12H  . Tamsulosin HCl  0.4 mg Oral QPC supper  . Travoprost (BAK Free)  1 drop Both Eyes BID  . DISCONTD: furosemide  40 mg Intravenous Daily    OBJECTIVE  Filed Vitals:   07/25/12 1413 07/25/12 1700 07/25/12 2135 07/26/12 0535  BP: 124/46 125/45 152/56 139/51  Pulse: 61 65 66 67  Temp: 97.8 F (36.6 C)  99.4 F (37.4 C) 98.2 F (36.8 C)  TempSrc: Oral  Oral Oral  Resp: 18  20 16   Height:      Weight:    145 lb 4.8 oz (65.908 kg)  SpO2: 94%  96% 93%    Intake/Output Summary (Last 24 hours) at 07/26/12 0812 Last data filed at 07/26/12 0758  Gross per 24 hour  Intake    960 ml  Output    725 ml  Net    235 ml   Filed Weights   07/23/12 1039 07/25/12 0449 07/26/12 0535  Weight: 153 lb (69.4 kg) 141 lb 4.8 oz (64.093 kg) 145 lb 4.8 oz (65.908 kg)    PHYSICAL EXAM  General: Pleasant, NAD. Neuro: Alert and oriented X 3. Moves all extremities spontaneously. Psych: Normal affect. HEENT:   Normal  Neck: Supple without bruits or JVD. Lungs:  Resp regular and unlabored, CTA. Heart: RRR no s3, s4, or murmurs. Abdomen: Soft, non-tender, non-distended, BS + x 4.  Extremities: No clubbing, cyanosis or edema. DP/PT/Radials 2+ and equal bilaterally, right leg is weak. He has no muscle tenderness.  Accessory Clinical Findings  CBC  Basename 07/24/12 0615 07/23/12 1130  WBC 4.9 5.2  NEUTROABS 3.2 --  HGB 9.1* 9.5*  HCT 29.3* 30.6*  MCV 87.2 86.9  PLT 151 158   Basic Metabolic Panel  Basename 07/25/12 0600 07/24/12 0615  NA 139 140  K 3.7 3.7  CL 98 100  CO2 32 30  GLUCOSE 163* 119*  BUN 34* 35*  CREATININE 1.97* 2.00*  CALCIUM 9.1 9.0  MG -- --  PHOS -- --   Liver Function Tests No results found for this basename: AST:2,ALT:2,ALKPHOS:2,BILITOT:2,PROT:2,ALBUMIN:2 in the last 72 hours No results found for this basename: LIPASE:2,AMYLASE:2 in the last 72 hours Cardiac Enzymes  Basename 07/23/12 1130  CKTOTAL 38  CKMB 2.3  CKMBINDEX --  TROPONINI <0.30   BNP No components found with this  basename: POCBNP:3 D-Dimer No results found for this basename: DDIMER:2 in the last 72 hours Hemoglobin A1C No results found for this basename: HGBA1C in the last 72 hours Fasting Lipid Panel No results found for this basename: CHOL,HDL,LDLCALC,TRIG,CHOLHDL,LDLDIRECT in the last 72 hours Thyroid Function Tests No results found for this basename: TSH,T4TOTAL,FREET3,T3FREE,THYROIDAB in the last 72 hours  TELE  Normal sinus rhythm  ECG   Radiology/Studies  Ct Head Wo Contrast  07/12/2012  *RADIOLOGY REPORT*  Clinical Data: Hemipareses.  History of stroke.  CT HEAD WITHOUT CONTRAST  Technique:  Contiguous axial images were obtained from the base of the skull through the vertex without contrast.  Comparison: Report from examinations 2001 and 2004.  Actual images no longer available.  Findings: The brain shows mild generalized atrophy, not advanced for age.  There is no  evidence of old or acute focal infarction. No mass lesion, hemorrhage, hydrocephalus or extra-axial collection.  There is atherosclerotic calcification of the major vessels at the base of the brain.  Calvarium is unremarkable. Sinuses, middle ears and mastoids are clear with chronic sclerosis of the left mastoid region.  IMPRESSION: No acute or focal finding.  Mild age related atrophy, not advanced for age.  Original Report Authenticated By: Thomasenia Sales, M.D.   Dg Chest Port 1 View  07/24/2012  *RADIOLOGY REPORT*  Clinical Data: 78 year old male status post pleural catheter placement.  PORTABLE CHEST - 1 VIEW  Comparison: 07/23/2012 and earlier.  Findings: Portable supine AP view 1640 hours.  Small caliber left pleural drain in place.  Virtually resolved previously seen moderate to large left pleural effusion.  Small pneumothorax identified at the left base, pleural edge visible at the costophrenic sulcus.  Small volume subcutaneous gas.   Stable cardiac size and mediastinal contours.  Sequelae of cardiac valve replacement.  Veiling opacity on the right compatible with small pleural effusion. Visualized tracheal air column is within normal limits.  IMPRESSION: 1.  Resolved left pleural effusion with small pneumothorax status post left pleural catheter placement. 2.  Veiling right pleural effusion.  Original Report Authenticated By: Harley Hallmark, M.D.   Dg Chest Port 1 View  07/23/2012  *RADIOLOGY REPORT*  Clinical Data: Short of breath.  PORTABLE CHEST - 1 VIEW  Comparison: 04/23/2012.  Findings: Increasing bilateral pleural effusions, left moderate and right small.  Diffuse airspace disease is present with perihilar and basilar predominant distribution compatible with CHF. Compressive atelectasis of the left lower lobe.  Median sternotomy and aortic valve replacement. Monitoring leads are projected over the chest.  IMPRESSION: Moderate CHF.  Moderate left pleural effusion with compressive atelectasis.   Original Report Authenticated By: Andreas Newport, M.D.   Dg C-arm 1-60 Min-no Report  07/24/2012  CLINICAL DATA: intraop OR 17   C-ARM 1-60 MINUTES  Fluoroscopy was utilized by the requesting physician.  No radiographic  interpretation.      ASSESSMENT AND PLAN     He is stable from a cardiac standpoint. Once his chest tube drained, and he feels well without shortness of breath, will discharge home. Because of the concern from his daughter that simvastatin could be causing some right leg weakness, which I doubt, we will discontinue this drug and all statins for 6 weeks. Otherwise, his meds are unchanged. In addition he will see Dr. Laneta Simmers next week for followup and chest x-ray. Home health nurse needs to be arranged to drain his chest tube per Dr. Laneta Simmers. We also will arrange outpatient physical therapy. All questions answered. I'll  see him back in the office in a couple weeks.  Signed, Valera Castle MD

## 2012-07-28 DIAGNOSIS — J90 Pleural effusion, not elsewhere classified: Secondary | ICD-10-CM | POA: Diagnosis not present

## 2012-07-28 DIAGNOSIS — Z4801 Encounter for change or removal of surgical wound dressing: Secondary | ICD-10-CM | POA: Diagnosis not present

## 2012-07-28 DIAGNOSIS — E119 Type 2 diabetes mellitus without complications: Secondary | ICD-10-CM | POA: Diagnosis not present

## 2012-07-28 DIAGNOSIS — I10 Essential (primary) hypertension: Secondary | ICD-10-CM | POA: Diagnosis not present

## 2012-07-28 DIAGNOSIS — I2589 Other forms of chronic ischemic heart disease: Secondary | ICD-10-CM | POA: Diagnosis not present

## 2012-07-28 DIAGNOSIS — I5023 Acute on chronic systolic (congestive) heart failure: Secondary | ICD-10-CM | POA: Diagnosis not present

## 2012-07-30 ENCOUNTER — Ambulatory Visit (INDEPENDENT_AMBULATORY_CARE_PROVIDER_SITE_OTHER): Payer: Medicare Other | Admitting: Surgery

## 2012-07-30 ENCOUNTER — Ambulatory Visit
Admission: RE | Admit: 2012-07-30 | Discharge: 2012-07-30 | Disposition: A | Discharge status: Home / Self Care | Payer: Medicare Other | Source: Ambulatory Visit | Attending: Surgery | Admitting: Surgery

## 2012-07-30 ENCOUNTER — Encounter: Payer: Self-pay | Admitting: Surgery

## 2012-07-30 VITALS — BP 138/71 | HR 58 | Resp 16

## 2012-07-30 DIAGNOSIS — J9819 Other pulmonary collapse: Secondary | ICD-10-CM | POA: Diagnosis not present

## 2012-07-30 DIAGNOSIS — I509 Heart failure, unspecified: Secondary | ICD-10-CM

## 2012-07-30 DIAGNOSIS — Z09 Encounter for follow-up examination after completed treatment for conditions other than malignant neoplasm: Secondary | ICD-10-CM | POA: Diagnosis not present

## 2012-07-30 DIAGNOSIS — R918 Other nonspecific abnormal finding of lung field: Secondary | ICD-10-CM | POA: Diagnosis not present

## 2012-07-30 DIAGNOSIS — J90 Pleural effusion, not elsewhere classified: Secondary | ICD-10-CM

## 2012-07-30 NOTE — Progress Notes (Signed)
[address]            [name] [address]          [phone]      HPI:  He returns today for followup after placement of a left Pleurx catheter last Wednesday for recurrent left pleural effusion after aortic valve replacement and coronary bypass graft surgery in February 2013. The catheter was drained completely on Friday prior to discharge. He was drained again on Sunday by home health nursing and drained 1200 cc although the patient's wife notes that only one bottle was used and she did not think the catheter was completely drained. The catheter was drained in the office today and 1300 cc of serous fluid was removed with complete drainage of the catheter. He denies any chest pain or shortness of breath.  Current Outpatient Prescriptions  Medication Sig Dispense Refill  . aspirin EC 325 MG EC tablet Take 1 tablet (325 mg total) by mouth daily.  30 tablet    . carvedilol (COREG) 25 MG tablet TAKE 1 TABLET BY MOUTH TWICE DAILY WITH A MEAL  60 tablet  5  . cholecalciferol (VITAMIN D) 1000 UNITS tablet Take 1,000 Units by mouth daily.       . furosemide (LASIX) 40 MG tablet TAKE 1 TABLET BY MOUTH ONCE DAILY  30 tablet  6  . Multiple Vitamin (MULTIVITAMIN) capsule Take 1 capsule by mouth daily.        . nitroGLYCERIN (NITROSTAT) 0.4 MG SL tablet Place 0.4 mg under the tongue every 5 (five) minutes x 3 doses as needed. For chest pain      . omeprazole (PRILOSEC) 20 MG capsule Take 20 mg by mouth daily.        . Tamsulosin HCl (FLOMAX) 0.4 MG CAPS TAKE 1 CAPSULE BY MOUTH ONCE DAILY AFTER SUPPER  30 capsule  1  . Travoprost, BAK Free, (TRAVATAN) 0.004 % SOLN ophthalmic solution Place 1 drop into both eyes 2 (two) times daily.      Marland Kitchen DISCONTD: metFORMIN (GLUCOPHAGE) 500 MG tablet Take 500-1,000 mg by mouth. 2 tabs in the am, 1 tab in the pm      . DISCONTD: potassium chloride SA (K-DUR,KLOR-CON) 20 MEQ tablet Take 1 tablet (20 mEq total) by mouth daily.  14  tablet  3     Physical Exam: BP 138/71  Pulse 58  Resp 16  SpO2 100% He looks well. Lung exam before draining the Pleurx catheter showed absent breath sounds in the left lower lobe. There are markedly improved after drainage of the catheter. The catheter exit site has slight redness around it. There is no drainage. He has moderate bilateral lower extremity edema to the knee.  Diagnostic Tests:  *RADIOLOGY REPORT*   Clinical Data: Pleural effusion   CHEST - 2 VIEW   Comparison: 07/24/2012   Findings: Left basilar thoracic Pleurx drain is stable in position. The tip is positioned in the posterior costophrenic sulcus.  The pneumothorax has resolved.  A moderate pleural effusion has recurred.   Opacity throughout the right hemithorax has markedly improved. Subsegmental atelectasis at the right base persist.  Stable postoperative changes and thoracic spine.   IMPRESSION: Resolved left pneumothorax.   Moderate left pleural effusion has reaccumulated.   Left Pleurx drain is stable.   Improved right pleural effusion with residual right basilar atelectasis.  Original Report Authenticated By: Donavan Burnet, M.D.   Impression:  He still has a large amount of drainage from the left Pleurx catheter. I think he should be drained daily by home health nursing. The etiology of this effusion is most likely chronic congestive heart failure and this needs to be managed aggressively by cardiology if we have any hope of resolving this pleural effusion permanently. His wife notes that his weight has been decreasing and his lower extremity edema seems to be improving.  Plan:  We will continue once daily drainage of the Pleurx catheter by home health nursing and I will plan to see him back in about 2 weeks with a repeat chest x-ray.

## 2012-07-31 ENCOUNTER — Ambulatory Visit (INDEPENDENT_AMBULATORY_CARE_PROVIDER_SITE_OTHER): Payer: Medicare Other | Admitting: Nurse Practitioner

## 2012-07-31 ENCOUNTER — Telehealth: Payer: Self-pay | Admitting: *Deleted

## 2012-07-31 ENCOUNTER — Encounter: Payer: Self-pay | Admitting: Nurse Practitioner

## 2012-07-31 VITALS — BP 136/64 | HR 58 | Ht 69.0 in | Wt 147.0 lb

## 2012-07-31 DIAGNOSIS — R0609 Other forms of dyspnea: Secondary | ICD-10-CM

## 2012-07-31 DIAGNOSIS — I2589 Other forms of chronic ischemic heart disease: Secondary | ICD-10-CM | POA: Diagnosis not present

## 2012-07-31 DIAGNOSIS — R0600 Dyspnea, unspecified: Secondary | ICD-10-CM

## 2012-07-31 DIAGNOSIS — R0989 Other specified symptoms and signs involving the circulatory and respiratory systems: Secondary | ICD-10-CM | POA: Diagnosis not present

## 2012-07-31 DIAGNOSIS — J90 Pleural effusion, not elsewhere classified: Secondary | ICD-10-CM | POA: Diagnosis not present

## 2012-07-31 DIAGNOSIS — I10 Essential (primary) hypertension: Secondary | ICD-10-CM | POA: Diagnosis not present

## 2012-07-31 DIAGNOSIS — E119 Type 2 diabetes mellitus without complications: Secondary | ICD-10-CM | POA: Diagnosis not present

## 2012-07-31 DIAGNOSIS — I5023 Acute on chronic systolic (congestive) heart failure: Secondary | ICD-10-CM | POA: Diagnosis not present

## 2012-07-31 DIAGNOSIS — I5020 Unspecified systolic (congestive) heart failure: Secondary | ICD-10-CM | POA: Diagnosis not present

## 2012-07-31 DIAGNOSIS — Z4801 Encounter for change or removal of surgical wound dressing: Secondary | ICD-10-CM | POA: Diagnosis not present

## 2012-07-31 LAB — CBC WITH DIFFERENTIAL/PLATELET
Basophils Absolute: 0 10*3/uL (ref 0.0–0.1)
Basophils Relative: 0.4 % (ref 0.0–3.0)
Eosinophils Absolute: 0.1 10*3/uL (ref 0.0–0.7)
Eosinophils Relative: 2.7 % (ref 0.0–5.0)
HCT: 29.8 % — ABNORMAL LOW (ref 39.0–52.0)
Hemoglobin: 9.5 g/dL — ABNORMAL LOW (ref 13.0–17.0)
Lymphocytes Relative: 21.8 % (ref 12.0–46.0)
Lymphs Abs: 1.2 10*3/uL (ref 0.7–4.0)
MCHC: 31.8 g/dL (ref 30.0–36.0)
MCV: 86.2 fl (ref 78.0–100.0)
Monocytes Absolute: 0.5 10*3/uL (ref 0.1–1.0)
Monocytes Relative: 9 % (ref 3.0–12.0)
Neutro Abs: 3.5 10*3/uL (ref 1.4–7.7)
Neutrophils Relative %: 66.1 % (ref 43.0–77.0)
Platelets: 171 10*3/uL (ref 150.0–400.0)
RBC: 3.46 Mil/uL — ABNORMAL LOW (ref 4.22–5.81)
RDW: 15.6 % — ABNORMAL HIGH (ref 11.5–14.6)
WBC: 5.3 10*3/uL (ref 4.5–10.5)

## 2012-07-31 LAB — BASIC METABOLIC PANEL
BUN: 35 mg/dL — ABNORMAL HIGH (ref 6–23)
CO2: 32 mEq/L (ref 19–32)
Calcium: 9.1 mg/dL (ref 8.4–10.5)
Chloride: 96 mEq/L (ref 96–112)
Creatinine, Ser: 2 mg/dL — ABNORMAL HIGH (ref 0.4–1.5)
GFR: 34.56 mL/min — ABNORMAL LOW (ref >60.00)
Glucose, Bld: 139 mg/dL — ABNORMAL HIGH (ref 70–99)
Potassium: 4.6 mEq/L (ref 3.5–5.1)
Sodium: 134 mEq/L — ABNORMAL LOW (ref 135–145)

## 2012-07-31 LAB — BRAIN NATRIURETIC PEPTIDE: Pro B Natriuretic peptide (BNP): 1518 pg/mL — ABNORMAL HIGH (ref 0.0–100.0)

## 2012-07-31 MED ORDER — ISOSORBIDE MONONITRATE ER 30 MG PO TB24: 30.0000 mg | ORAL_TABLET | Freq: Every day | ORAL | Status: DC

## 2012-07-31 MED ORDER — HYDRALAZINE HCL 25 MG PO TABS: 25.0000 mg | ORAL_TABLET | Freq: Two times a day (BID) | ORAL | Status: DC

## 2012-07-31 NOTE — Telephone Encounter (Signed)
Message copied by Awilda Bill on Wed Jul 31, 2012  4:31 PM ------      Message from: Rosalio Macadamia      Created: Wed Jul 31, 2012 12:47 PM       Ok to report. Labs are satisfactory. Fluid level test has improved. Kidney function basically unchanged. Could consider treatment for his Aranesp. Would discuss at his appointment next week with Dr. Daleen Squibb.

## 2012-07-31 NOTE — Progress Notes (Signed)
[name] [name] Date of Birth: Aug 12, 1926 Medical Record #[id number]  History of Present Illness: [name] [name] is seen today for a post hospital visit. He is seen for Dr. Daleen Squibb. He has multiple medical issues which include chronic systolic heart failure. His EF from February of 2013 was only 25 to 30%. His other issues include renal failure, HTN, anemia, CAD, HLD, cellulitis with prior leg abscess and pleural effusion for which he has had a pleurex catheter placed. He has had AS and underwent CABG/pericardial AVR back in February. He has had a steady decline since his surgery.   He comes in today. He is here with his wife. He was recently admitted with acute CHF. BNP was markedly elevated. Still has his pleurex in place and it is drained daily. He is not on ACE/ARB due to his kidney function. Saw Dr. Laneta Simmers yesterday. His breathing has improved. Swelling has improved but not resolved. Weights have been ok but declining overall. No chest pain. Has not had an updated echo since his surgery. Trying to not use salt. Did not understand about how to adjust his diuretics based on his weight. Not dizzy or lightheaded. BP is running in the 130's at home. Never been on nitrate/hydralazine. Probably can't use Aldactone due to his kidneys.   Current Outpatient Prescriptions on File Prior to Visit  Medication Sig Dispense Refill  . aspirin EC 325 MG EC tablet Take 1 tablet (325 mg total) by mouth daily.  30 tablet    . carvedilol (COREG) 25 MG tablet TAKE 1 TABLET BY MOUTH TWICE DAILY WITH A MEAL  60 tablet  5  . cholecalciferol (VITAMIN D) 1000 UNITS tablet Take 1,000 Units by mouth daily.       . furosemide (LASIX) 40 MG tablet TAKE 1 TABLET BY MOUTH ONCE DAILY  30 tablet  6  . Multiple Vitamin (MULTIVITAMIN) capsule Take 1 capsule by mouth daily.        . nitroGLYCERIN (NITROSTAT) 0.4 MG SL tablet Place 0.4 mg under the tongue every 5 (five) minutes x 3 doses as needed. For chest pain      . omeprazole  (PRILOSEC) 20 MG capsule Take 20 mg by mouth daily.        . Tamsulosin HCl (FLOMAX) 0.4 MG CAPS TAKE 1 CAPSULE BY MOUTH ONCE DAILY AFTER SUPPER  30 capsule  1  . Travoprost, BAK Free, (TRAVATAN) 0.004 % SOLN ophthalmic solution Place 1 drop into both eyes 2 (two) times daily.      . hydrALAZINE (APRESOLINE) 25 MG tablet Take 1 tablet (25 mg total) by mouth 2 (two) times daily.  60 tablet  3  . DISCONTD: metFORMIN (GLUCOPHAGE) 500 MG tablet Take 500-1,000 mg by mouth. 2 tabs in the am, 1 tab in the pm      . DISCONTD: potassium chloride SA (K-DUR,KLOR-CON) 20 MEQ tablet Take 1 tablet (20 mEq total) by mouth daily.  14 tablet  3    No Known Allergies  Past Medical History  Diagnosis Date  . HTN (hypertension)   . History of colon cancer     s/p colon resection  . Aortic stenosis     a. s/p AVR with 23mm Edwards pericardial valve 02/07/12 - post-op course complicated by pleural effusion requring thoracentesis/leg cellulitis 03/2012.  Marland Kitchen CAD (coronary artery disease)     a. s/p NSTEMI 12/2011:  LHC - Ostial left main 20%, ostial LAD 50%, mid 60-70%, ostial D1 40% and mid 40%, D2 70%, ostial  circumflex occluded, ostial RCA 80-90%, LVEDP was 42. b.  s/p CABG x 3 at time of AVR (LiMA-LAD, SVG-2nd daigonal, SVG-PDA) 02/07/12 (post-op course noted above).  . Ischemic cardiomyopathy   . Chronic systolic heart failure     a. TEE 01/2012: EF 25-30%, diffuse hypokinesis. b. Not on ACEI due to renal insufficiency.  Marland Kitchen HLD (hyperlipidemia)   . Stroke 10/01  . GERD (gastroesophageal reflux disease)   . Chronic kidney disease   . Myocardial infarction   . Pleural effusion     a. post-operatively after AVR/CABG s/p thoracentesis 03/2012 yielding 1L serosanguinous fluid.  . Diabetes mellitus     borderline  . Blood transfusion     NO REACTION TO TRANSFUSION  . Cellulitis     a. RLE cellulitis 2 months post-operatively after AVR/CABG - serratia marcessans, tx with I&D/antibiotics  . Mitral regurgitation      Moderate by TEE 01/2012  . Flash pulmonary edema     Post-cath 12/2011, went into acute pulm edema requiring IV lasix and intubation  . Baker's cyst 07/23/12    Incidental finding of LE venous dopplers    Past Surgical History  Procedure Date  . Colon resection 1996  . Esophageal dilation   . Colonoscopy   . Cataract extraction     rt  . Cardiac catheterization     1.3.13  stopped breathing, put on ventilator for 5-6 days  . Eye surgery   . Tonsillectomy   . Aortic valve replacement 02/07/2012    Procedure: AORTIC VALVE REPLACEMENT (AVR);  Surgeon: [name], MD;  Location: [facility] OR;  Service: Open Heart Surgery;  Laterality: N/A;  . Coronary artery bypass graft 02/07/2012    Procedure: CORONARY ARTERY BYPASS GRAFTING (CABG);  Surgeon: [name], MD;  Location: [facility] OR;  Service: Open Heart Surgery;  Laterality: N/A;  CABG x three; using right leg greater saphenous vein harvested endoscopically  . Chest tube insertion 07/24/2012    Procedure: INSERTION PLEURAL DRAINAGE CATHETER;  Surgeon: [name], MD;  Location: [facility] OR;  Service: Thoracic;  Laterality: Left;    History  Smoking status  . Never Smoker   Smokeless tobacco  . Never Used    History  Alcohol Use No    Family History  Problem Relation Age of Onset  . Cancer    . Heart attack    . Diabetes    . Heart failure    . Cancer Mother   . Heart attack Father     Review of Systems: The review of systems is per the HPI.  All other systems were reviewed and are negative.  Physical Exam: BP 136/64  Pulse 58  Ht 5\' 9"  (1.753 m)  Wt 147 lb (66.679 kg)  BMI 21.71 kg/m2 Patient is very pleasant and in no acute distress. He is thin. Skin is warm and dry. Color is normal.  HEENT is unremarkable. Normocephalic/atraumatic. PERRL. Sclera are nonicteric. Neck is supple. No masses. No JVD. Lungs are clear but decreased on the left. Left pleurex in place. Cardiac exam shows a regular rate and rhythm. Soft S3 noted.  Abdomen is soft. Extremities are with 1-2+ edema. Gait and ROM are intact. No gross neurologic deficits noted.  LABORATORY DATA: PENDING FOR TODAY  Lab Results  Component Value Date   WBC 4.9 07/24/2012   HGB 9.1* 07/24/2012   HCT 29.3* 07/24/2012   PLT 151 07/24/2012   GLUCOSE 126* 07/26/2012   CHOL 124 12/14/2011  TRIG 69 12/14/2011   HDL 47 12/14/2011   LDLCALC 63 12/14/2011   ALT 12 07/23/2012   AST 14 07/23/2012   NA 136 07/26/2012   K 4.3 07/26/2012   CL 100 07/26/2012   CREATININE 1.87* 07/26/2012   BUN 30* 07/26/2012   CO2 24 07/26/2012   INR 1.35 07/24/2012   HGBA1C 6.0* 02/06/2012     Assessment / Plan: 1. Systolic heart failure - Patient has had recent admission. On target dose of his Coreg. Not a candidate for ACE/ARB due to his kidneys. Will update his echo and see where we stand with his EF. I have added Imdur 30 mg and Hydralazine 25 mg BID to his regimen. Will continue to monitor his BP at home and weigh daily. His wife will give extra dose of diuretic for weight gain over 3 pounds in 24 hours. Continue salt restriction. Will check labs today as well. He may benefit from having his anemia corrected with Aranesp as well.   2. S/P CABG/AVR - no chest pain. Will update echo.   3. CKD - rechecking labs today.   He is to see Dr. Daleen Squibb next week. Will see what the echo shows. Medicines are added today. Patient is agreeable to this plan and will call if any problems develop in the interim.

## 2012-07-31 NOTE — Telephone Encounter (Signed)
Called and notified spouse of lab results.  Pt does not see Dr. Daleen Squibb until the end of September.  Made follow up appt with Lawson Fiscal in the mean while to f/u echo and CKD issues.   Vista Mink, CMA

## 2012-07-31 NOTE — Patient Instructions (Addendum)
We are adding Imdur 30 mg daily  We are adding Hydralazine 25 mg two times a day  Monitor your blood pressure at home. You will see your blood pressure come down  Weigh every day. Take an extra fluid pill for weight gain of 3 pounds overnight  Minimize your salt  We will see you next week  We are going to get an ultrasound of your heart  Call the [facility] office at [phone] if you have any questions, problems or concerns.

## 2012-08-01 DIAGNOSIS — E119 Type 2 diabetes mellitus without complications: Secondary | ICD-10-CM | POA: Diagnosis not present

## 2012-08-01 DIAGNOSIS — J90 Pleural effusion, not elsewhere classified: Secondary | ICD-10-CM | POA: Diagnosis not present

## 2012-08-01 DIAGNOSIS — I5023 Acute on chronic systolic (congestive) heart failure: Secondary | ICD-10-CM | POA: Diagnosis not present

## 2012-08-01 DIAGNOSIS — I10 Essential (primary) hypertension: Secondary | ICD-10-CM | POA: Diagnosis not present

## 2012-08-01 DIAGNOSIS — I2589 Other forms of chronic ischemic heart disease: Secondary | ICD-10-CM | POA: Diagnosis not present

## 2012-08-01 DIAGNOSIS — Z4801 Encounter for change or removal of surgical wound dressing: Secondary | ICD-10-CM | POA: Diagnosis not present

## 2012-08-02 DIAGNOSIS — I2589 Other forms of chronic ischemic heart disease: Secondary | ICD-10-CM | POA: Diagnosis not present

## 2012-08-02 DIAGNOSIS — J90 Pleural effusion, not elsewhere classified: Secondary | ICD-10-CM | POA: Diagnosis not present

## 2012-08-02 DIAGNOSIS — Z4801 Encounter for change or removal of surgical wound dressing: Secondary | ICD-10-CM | POA: Diagnosis not present

## 2012-08-02 DIAGNOSIS — E119 Type 2 diabetes mellitus without complications: Secondary | ICD-10-CM | POA: Diagnosis not present

## 2012-08-02 DIAGNOSIS — I10 Essential (primary) hypertension: Secondary | ICD-10-CM | POA: Diagnosis not present

## 2012-08-02 DIAGNOSIS — I5023 Acute on chronic systolic (congestive) heart failure: Secondary | ICD-10-CM | POA: Diagnosis not present

## 2012-08-03 DIAGNOSIS — I10 Essential (primary) hypertension: Secondary | ICD-10-CM | POA: Diagnosis not present

## 2012-08-03 DIAGNOSIS — I2589 Other forms of chronic ischemic heart disease: Secondary | ICD-10-CM | POA: Diagnosis not present

## 2012-08-03 DIAGNOSIS — Z4801 Encounter for change or removal of surgical wound dressing: Secondary | ICD-10-CM | POA: Diagnosis not present

## 2012-08-03 DIAGNOSIS — I5023 Acute on chronic systolic (congestive) heart failure: Secondary | ICD-10-CM | POA: Diagnosis not present

## 2012-08-03 DIAGNOSIS — J90 Pleural effusion, not elsewhere classified: Secondary | ICD-10-CM | POA: Diagnosis not present

## 2012-08-03 DIAGNOSIS — E119 Type 2 diabetes mellitus without complications: Secondary | ICD-10-CM | POA: Diagnosis not present

## 2012-08-04 DIAGNOSIS — Z4801 Encounter for change or removal of surgical wound dressing: Secondary | ICD-10-CM | POA: Diagnosis not present

## 2012-08-04 DIAGNOSIS — I5023 Acute on chronic systolic (congestive) heart failure: Secondary | ICD-10-CM | POA: Diagnosis not present

## 2012-08-04 DIAGNOSIS — I2589 Other forms of chronic ischemic heart disease: Secondary | ICD-10-CM | POA: Diagnosis not present

## 2012-08-04 DIAGNOSIS — I10 Essential (primary) hypertension: Secondary | ICD-10-CM | POA: Diagnosis not present

## 2012-08-04 DIAGNOSIS — E119 Type 2 diabetes mellitus without complications: Secondary | ICD-10-CM | POA: Diagnosis not present

## 2012-08-04 DIAGNOSIS — J90 Pleural effusion, not elsewhere classified: Secondary | ICD-10-CM | POA: Diagnosis not present

## 2012-08-05 DIAGNOSIS — J90 Pleural effusion, not elsewhere classified: Secondary | ICD-10-CM | POA: Diagnosis not present

## 2012-08-05 DIAGNOSIS — I10 Essential (primary) hypertension: Secondary | ICD-10-CM | POA: Diagnosis not present

## 2012-08-05 DIAGNOSIS — E119 Type 2 diabetes mellitus without complications: Secondary | ICD-10-CM | POA: Diagnosis not present

## 2012-08-05 DIAGNOSIS — Z4801 Encounter for change or removal of surgical wound dressing: Secondary | ICD-10-CM | POA: Diagnosis not present

## 2012-08-05 DIAGNOSIS — I2589 Other forms of chronic ischemic heart disease: Secondary | ICD-10-CM | POA: Diagnosis not present

## 2012-08-05 DIAGNOSIS — I5023 Acute on chronic systolic (congestive) heart failure: Secondary | ICD-10-CM | POA: Diagnosis not present

## 2012-08-06 ENCOUNTER — Ambulatory Visit (HOSPITAL_COMMUNITY): Payer: Medicare Other | Attending: Cardiology | Admitting: Radiology

## 2012-08-06 DIAGNOSIS — I2589 Other forms of chronic ischemic heart disease: Secondary | ICD-10-CM | POA: Insufficient documentation

## 2012-08-06 DIAGNOSIS — I079 Rheumatic tricuspid valve disease, unspecified: Secondary | ICD-10-CM | POA: Insufficient documentation

## 2012-08-06 DIAGNOSIS — I509 Heart failure, unspecified: Secondary | ICD-10-CM | POA: Insufficient documentation

## 2012-08-06 DIAGNOSIS — I379 Nonrheumatic pulmonary valve disorder, unspecified: Secondary | ICD-10-CM | POA: Diagnosis not present

## 2012-08-06 DIAGNOSIS — R0600 Dyspnea, unspecified: Secondary | ICD-10-CM

## 2012-08-06 DIAGNOSIS — J90 Pleural effusion, not elsewhere classified: Secondary | ICD-10-CM | POA: Diagnosis not present

## 2012-08-06 DIAGNOSIS — Z4801 Encounter for change or removal of surgical wound dressing: Secondary | ICD-10-CM | POA: Diagnosis not present

## 2012-08-06 DIAGNOSIS — I5023 Acute on chronic systolic (congestive) heart failure: Secondary | ICD-10-CM | POA: Diagnosis not present

## 2012-08-06 DIAGNOSIS — I5020 Unspecified systolic (congestive) heart failure: Secondary | ICD-10-CM

## 2012-08-06 DIAGNOSIS — I10 Essential (primary) hypertension: Secondary | ICD-10-CM | POA: Insufficient documentation

## 2012-08-06 DIAGNOSIS — I059 Rheumatic mitral valve disease, unspecified: Secondary | ICD-10-CM | POA: Diagnosis not present

## 2012-08-06 DIAGNOSIS — E119 Type 2 diabetes mellitus without complications: Secondary | ICD-10-CM | POA: Diagnosis not present

## 2012-08-06 DIAGNOSIS — I359 Nonrheumatic aortic valve disorder, unspecified: Secondary | ICD-10-CM | POA: Diagnosis not present

## 2012-08-06 DIAGNOSIS — I2510 Atherosclerotic heart disease of native coronary artery without angina pectoris: Secondary | ICD-10-CM | POA: Insufficient documentation

## 2012-08-06 NOTE — Progress Notes (Signed)
Echocardiogram performed.  

## 2012-08-07 DIAGNOSIS — I5023 Acute on chronic systolic (congestive) heart failure: Secondary | ICD-10-CM | POA: Diagnosis not present

## 2012-08-07 DIAGNOSIS — I10 Essential (primary) hypertension: Secondary | ICD-10-CM | POA: Diagnosis not present

## 2012-08-07 DIAGNOSIS — E119 Type 2 diabetes mellitus without complications: Secondary | ICD-10-CM | POA: Diagnosis not present

## 2012-08-07 DIAGNOSIS — I2589 Other forms of chronic ischemic heart disease: Secondary | ICD-10-CM | POA: Diagnosis not present

## 2012-08-07 DIAGNOSIS — J90 Pleural effusion, not elsewhere classified: Secondary | ICD-10-CM | POA: Diagnosis not present

## 2012-08-07 DIAGNOSIS — Z4801 Encounter for change or removal of surgical wound dressing: Secondary | ICD-10-CM | POA: Diagnosis not present

## 2012-08-08 DIAGNOSIS — I5023 Acute on chronic systolic (congestive) heart failure: Secondary | ICD-10-CM | POA: Diagnosis not present

## 2012-08-08 DIAGNOSIS — Z4801 Encounter for change or removal of surgical wound dressing: Secondary | ICD-10-CM | POA: Diagnosis not present

## 2012-08-08 DIAGNOSIS — I2589 Other forms of chronic ischemic heart disease: Secondary | ICD-10-CM | POA: Diagnosis not present

## 2012-08-08 DIAGNOSIS — I10 Essential (primary) hypertension: Secondary | ICD-10-CM | POA: Diagnosis not present

## 2012-08-08 DIAGNOSIS — J90 Pleural effusion, not elsewhere classified: Secondary | ICD-10-CM | POA: Diagnosis not present

## 2012-08-08 DIAGNOSIS — E119 Type 2 diabetes mellitus without complications: Secondary | ICD-10-CM | POA: Diagnosis not present

## 2012-08-09 ENCOUNTER — Other Ambulatory Visit: Payer: Self-pay | Admitting: Surgery

## 2012-08-09 DIAGNOSIS — Z4801 Encounter for change or removal of surgical wound dressing: Secondary | ICD-10-CM | POA: Diagnosis not present

## 2012-08-09 DIAGNOSIS — J90 Pleural effusion, not elsewhere classified: Secondary | ICD-10-CM | POA: Diagnosis not present

## 2012-08-09 DIAGNOSIS — I5023 Acute on chronic systolic (congestive) heart failure: Secondary | ICD-10-CM | POA: Diagnosis not present

## 2012-08-09 DIAGNOSIS — I2589 Other forms of chronic ischemic heart disease: Secondary | ICD-10-CM | POA: Diagnosis not present

## 2012-08-09 DIAGNOSIS — I10 Essential (primary) hypertension: Secondary | ICD-10-CM | POA: Diagnosis not present

## 2012-08-09 DIAGNOSIS — E119 Type 2 diabetes mellitus without complications: Secondary | ICD-10-CM | POA: Diagnosis not present

## 2012-08-10 DIAGNOSIS — I5023 Acute on chronic systolic (congestive) heart failure: Secondary | ICD-10-CM | POA: Diagnosis not present

## 2012-08-10 DIAGNOSIS — I2589 Other forms of chronic ischemic heart disease: Secondary | ICD-10-CM | POA: Diagnosis not present

## 2012-08-10 DIAGNOSIS — J90 Pleural effusion, not elsewhere classified: Secondary | ICD-10-CM | POA: Diagnosis not present

## 2012-08-10 DIAGNOSIS — Z4801 Encounter for change or removal of surgical wound dressing: Secondary | ICD-10-CM | POA: Diagnosis not present

## 2012-08-10 DIAGNOSIS — I10 Essential (primary) hypertension: Secondary | ICD-10-CM | POA: Diagnosis not present

## 2012-08-10 DIAGNOSIS — E119 Type 2 diabetes mellitus without complications: Secondary | ICD-10-CM | POA: Diagnosis not present

## 2012-08-11 DIAGNOSIS — Z4801 Encounter for change or removal of surgical wound dressing: Secondary | ICD-10-CM | POA: Diagnosis not present

## 2012-08-11 DIAGNOSIS — J90 Pleural effusion, not elsewhere classified: Secondary | ICD-10-CM | POA: Diagnosis not present

## 2012-08-11 DIAGNOSIS — I5023 Acute on chronic systolic (congestive) heart failure: Secondary | ICD-10-CM | POA: Diagnosis not present

## 2012-08-11 DIAGNOSIS — E119 Type 2 diabetes mellitus without complications: Secondary | ICD-10-CM | POA: Diagnosis not present

## 2012-08-11 DIAGNOSIS — I10 Essential (primary) hypertension: Secondary | ICD-10-CM | POA: Diagnosis not present

## 2012-08-11 DIAGNOSIS — I2589 Other forms of chronic ischemic heart disease: Secondary | ICD-10-CM | POA: Diagnosis not present

## 2012-08-12 ENCOUNTER — Other Ambulatory Visit: Payer: Self-pay | Admitting: Cardiology

## 2012-08-12 DIAGNOSIS — I5023 Acute on chronic systolic (congestive) heart failure: Secondary | ICD-10-CM | POA: Diagnosis not present

## 2012-08-12 DIAGNOSIS — J90 Pleural effusion, not elsewhere classified: Secondary | ICD-10-CM | POA: Diagnosis not present

## 2012-08-12 DIAGNOSIS — I10 Essential (primary) hypertension: Secondary | ICD-10-CM | POA: Diagnosis not present

## 2012-08-12 DIAGNOSIS — E119 Type 2 diabetes mellitus without complications: Secondary | ICD-10-CM | POA: Diagnosis not present

## 2012-08-12 DIAGNOSIS — Z4801 Encounter for change or removal of surgical wound dressing: Secondary | ICD-10-CM | POA: Diagnosis not present

## 2012-08-12 DIAGNOSIS — I2589 Other forms of chronic ischemic heart disease: Secondary | ICD-10-CM | POA: Diagnosis not present

## 2012-08-13 ENCOUNTER — Ambulatory Visit (INDEPENDENT_AMBULATORY_CARE_PROVIDER_SITE_OTHER): Payer: Medicare Other | Admitting: Surgery

## 2012-08-13 ENCOUNTER — Ambulatory Visit
Admission: RE | Admit: 2012-08-13 | Discharge: 2012-08-13 | Disposition: A | Discharge status: Home / Self Care | Payer: Medicare Other | Source: Ambulatory Visit | Attending: Surgery | Admitting: Surgery

## 2012-08-13 ENCOUNTER — Encounter: Payer: Self-pay | Admitting: Surgery

## 2012-08-13 VITALS — BP 130/56 | HR 59 | Resp 18 | Ht 69.0 in | Wt 140.0 lb

## 2012-08-13 DIAGNOSIS — J9819 Other pulmonary collapse: Secondary | ICD-10-CM | POA: Diagnosis not present

## 2012-08-13 DIAGNOSIS — Z09 Encounter for follow-up examination after completed treatment for conditions other than malignant neoplasm: Secondary | ICD-10-CM | POA: Diagnosis not present

## 2012-08-13 DIAGNOSIS — J90 Pleural effusion, not elsewhere classified: Secondary | ICD-10-CM

## 2012-08-13 DIAGNOSIS — I509 Heart failure, unspecified: Secondary | ICD-10-CM | POA: Diagnosis not present

## 2012-08-13 NOTE — Progress Notes (Signed)
[address]            [name] [address]          [phone]      HPI:  He returns today for followup of a left Pleurx catheter placed on 07/23/2012 for recurrent left pleural effusion. The catheter drained 900-950 cc per day from August 24 through August 28 and since has started to decrease. It drained 750 cc on August 29, 825 cc on August 30, and 650 cc on August 31. Then on September 1 it only drained 100 cc but his wife suspects that the nurse did not drain it  correctly. The following day on September 2  it drained 1020 cc. Today in the office it drained 550 cc of yellow, serous fluid. The patient said that he has been feeling well otherwise and has had no shortness of breath. His lower extremity edema has continued to decrease.  Current Outpatient Prescriptions  Medication Sig Dispense Refill  . aspirin EC 325 MG EC tablet Take 1 tablet (325 mg total) by mouth daily.  30 tablet    . carvedilol (COREG) 25 MG tablet TAKE 1 TABLET BY MOUTH TWICE DAILY WITH A MEAL  60 tablet  5  . cholecalciferol (VITAMIN D) 1000 UNITS tablet Take 1,000 Units by mouth daily.       . furosemide (LASIX) 40 MG tablet TAKE 1 TABLET BY MOUTH ONCE DAILY  30 tablet  6  . hydrALAZINE (APRESOLINE) 25 MG tablet Take 1 tablet (25 mg total) by mouth 2 (two) times daily.  60 tablet  3  . isosorbide mononitrate (IMDUR) 30 MG 24 hr tablet Take 1 tablet (30 mg total) by mouth daily.  30 tablet  6  . Multiple Vitamin (MULTIVITAMIN) capsule Take 1 capsule by mouth daily.        . nitroGLYCERIN (NITROSTAT) 0.4 MG SL tablet Place 0.4 mg under the tongue every 5 (five) minutes x 3 doses as needed. For chest pain      . omeprazole (PRILOSEC) 20 MG capsule Take 20 mg by mouth daily.        . Tamsulosin HCl (FLOMAX) 0.4 MG CAPS TAKE 1 CAPSULE BY MOUTH ONCE DAILY AFTER SUPPER  30 capsule  1  . Travoprost, BAK Free, (TRAVATAN) 0.004 % SOLN ophthalmic solution Place 1 drop into both eyes 2 (two)  times daily.      Marland Kitchen DISCONTD: metFORMIN (GLUCOPHAGE) 500 MG tablet Take 500-1,000 mg by mouth. 2 tabs in the am, 1 tab in the pm      . DISCONTD: potassium chloride SA (K-DUR,KLOR-CON) 20 MEQ tablet Take 1 tablet (20 mEq total) by mouth daily.  14 tablet  3     Physical Exam: BP 130/56  Pulse 59  Resp 18  Ht 5\' 9"  (1.753 m)  Wt 140 lb (63.504 kg)  BMI 20.67 kg/m2  SpO2 95% He looks well. Lung exam reveals slight decrease in breath sounds at the left base prior to drainage of the Pleurx catheter. The catheter exit site has mild irritation around it. The chest wall entry incision is well-healed.  Diagnostic Tests:  *RADIOLOGY REPORT*   Clinical Data: Left pleural effusion, follow-up, some shortness of breath   CHEST - 2 VIEW   Comparison: Chest x-ray of 07/30/2012   Findings: There has been a decrease in volume of the left pleural effusion  with a left pleurex catheter remaining.  Volume loss at the left lung base has improved.  The right lung is clear.  The heart remains enlarged and there is perhaps minimal pulmonary vascular congestion present.   IMPRESSION:   1.  Decrease in volume of the left pleural effusion with decrease in left basilar atelectasis. 2.  Cardiomegaly.  Question mild pulmonary vascular congestion.     Original Report Authenticated By: Juline Patch, M.D.   Impression:  The catheter appears to be functioning well and the amount of drainage seems to be trending downwards although still a significant volume. I think we should continue daily drainage for now. I would give him a few more weeks before considering doing a talc pleurodesis through the catheter.  Plan:  I'll see him back in 2 weeks with a chest x-ray.

## 2012-08-14 ENCOUNTER — Encounter: Payer: Self-pay | Admitting: Nurse Practitioner

## 2012-08-14 ENCOUNTER — Ambulatory Visit (INDEPENDENT_AMBULATORY_CARE_PROVIDER_SITE_OTHER): Payer: Medicare Other | Admitting: Nurse Practitioner

## 2012-08-14 VITALS — BP 142/56 | HR 52 | Ht 69.0 in | Wt 138.8 lb

## 2012-08-14 DIAGNOSIS — G629 Polyneuropathy, unspecified: Secondary | ICD-10-CM

## 2012-08-14 DIAGNOSIS — R5383 Other fatigue: Secondary | ICD-10-CM

## 2012-08-14 DIAGNOSIS — I2589 Other forms of chronic ischemic heart disease: Secondary | ICD-10-CM | POA: Diagnosis not present

## 2012-08-14 DIAGNOSIS — R531 Weakness: Secondary | ICD-10-CM

## 2012-08-14 DIAGNOSIS — D531 Other megaloblastic anemias, not elsewhere classified: Secondary | ICD-10-CM

## 2012-08-14 DIAGNOSIS — E119 Type 2 diabetes mellitus without complications: Secondary | ICD-10-CM | POA: Diagnosis not present

## 2012-08-14 DIAGNOSIS — R5381 Other malaise: Secondary | ICD-10-CM

## 2012-08-14 DIAGNOSIS — G589 Mononeuropathy, unspecified: Secondary | ICD-10-CM

## 2012-08-14 DIAGNOSIS — I5022 Chronic systolic (congestive) heart failure: Secondary | ICD-10-CM

## 2012-08-14 DIAGNOSIS — J90 Pleural effusion, not elsewhere classified: Secondary | ICD-10-CM | POA: Diagnosis not present

## 2012-08-14 DIAGNOSIS — D619 Aplastic anemia, unspecified: Secondary | ICD-10-CM | POA: Diagnosis not present

## 2012-08-14 DIAGNOSIS — R42 Dizziness and giddiness: Secondary | ICD-10-CM

## 2012-08-14 DIAGNOSIS — D539 Nutritional anemia, unspecified: Secondary | ICD-10-CM | POA: Diagnosis not present

## 2012-08-14 DIAGNOSIS — Z4801 Encounter for change or removal of surgical wound dressing: Secondary | ICD-10-CM | POA: Diagnosis not present

## 2012-08-14 DIAGNOSIS — I5023 Acute on chronic systolic (congestive) heart failure: Secondary | ICD-10-CM | POA: Diagnosis not present

## 2012-08-14 DIAGNOSIS — I25810 Atherosclerosis of coronary artery bypass graft(s) without angina pectoris: Secondary | ICD-10-CM

## 2012-08-14 DIAGNOSIS — I10 Essential (primary) hypertension: Secondary | ICD-10-CM | POA: Diagnosis not present

## 2012-08-14 LAB — BASIC METABOLIC PANEL
BUN: 35 mg/dL — ABNORMAL HIGH (ref 6–23)
CO2: 32 mEq/L (ref 19–32)
Calcium: 9.2 mg/dL (ref 8.4–10.5)
Chloride: 101 mEq/L (ref 96–112)
Creatinine, Ser: 2.1 mg/dL — ABNORMAL HIGH (ref 0.4–1.5)
GFR: 32.45 mL/min — ABNORMAL LOW (ref >60.00)
Glucose, Bld: 82 mg/dL (ref 70–99)
Potassium: 4.5 mEq/L (ref 3.5–5.1)
Sodium: 137 mEq/L (ref 135–145)

## 2012-08-14 LAB — CBC WITH DIFFERENTIAL/PLATELET
Basophils Absolute: 0 10*3/uL (ref 0.0–0.1)
Basophils Relative: 0.5 % (ref 0.0–3.0)
Eosinophils Absolute: 0.1 10*3/uL (ref 0.0–0.7)
Eosinophils Relative: 2 % (ref 0.0–5.0)
HCT: 32 % — ABNORMAL LOW (ref 39.0–52.0)
Hemoglobin: 10 g/dL — ABNORMAL LOW (ref 13.0–17.0)
Lymphocytes Relative: 30.1 % (ref 12.0–46.0)
Lymphs Abs: 1.5 10*3/uL (ref 0.7–4.0)
MCHC: 31.4 g/dL (ref 30.0–36.0)
MCV: 85.8 fl (ref 78.0–100.0)
Monocytes Absolute: 0.3 10*3/uL (ref 0.1–1.0)
Monocytes Relative: 6.4 % (ref 3.0–12.0)
Neutro Abs: 3 10*3/uL (ref 1.4–7.7)
Neutrophils Relative %: 61 % (ref 43.0–77.0)
Platelets: 190 10*3/uL (ref 150.0–400.0)
RBC: 3.72 Mil/uL — ABNORMAL LOW (ref 4.22–5.81)
RDW: 16 % — ABNORMAL HIGH (ref 11.5–14.6)
WBC: 4.8 10*3/uL (ref 4.5–10.5)

## 2012-08-14 LAB — FOLATE: Folate: >24.8 ng/mL (ref >5.9)

## 2012-08-14 LAB — FERRITIN: Ferritin: 29.1 ng/mL (ref 22.0–322.0)

## 2012-08-14 LAB — VITAMIN B12: Vitamin B-12: 412 pg/mL (ref 211–911)

## 2012-08-14 MED ORDER — HYDRALAZINE HCL 25 MG PO TABS: 25.0000 mg | ORAL_TABLET | Freq: Three times a day (TID) | ORAL | Status: DC

## 2012-08-14 NOTE — Patient Instructions (Addendum)
We are going to check labs today.  Based on your labs, we may send you to hematology for your anemia  Increase the Hydralazine to three times a day.  See Dr. Daleen Squibb later this month as planned.  Weigh yourself each morning and record.  Take extra dose of diuretic for weight gain of 3 pounds in 24 hours.   Limit sodium intake. Goal is to have less than 2000 mg (2gm) of salt per day.   Call the [facility] office at [phone] if you have any questions, problems or concerns.

## 2012-08-14 NOTE — Progress Notes (Signed)
Margaretmary Dys Date of Birth: [birth year] Medical Record #[id number]  History of Present Illness: Mr. Carranza is seen back today for a 2 week check. He is subsequently seen with Dr. Daleen Squibb. He has multiple medical issues which include chronic systolic heart failure. His EF from February of 2013 was only 25 to 30%. His other issues include renal failure, HTN, anemia, CAD, HLD, cellulitis with prior leg abscess and pleural effusion for which he has had a pleurex catheter placed. He has had AS and underwent CABG/pericardial AVR back in February. He had a steady decline since his surgery. When I saw him 2 weeks ago, I placed him on nitrates and hydralazine. He has had his echo updated which unfortunately does not show improvement in his EF.  He comes in today. He is here with his wife. He is doing so much better clinically. Weight is down 9 pounds. Pleurex is still in place but starting to drain less. Not as short of breath. Swelling has resolved. Not dizzy. Tolerating his medicines well. Now actually walking. Overall, he has improved quite nicely.   Current Outpatient Prescriptions on File Prior to Visit  Medication Sig Dispense Refill  . aspirin EC 325 MG EC tablet Take 1 tablet (325 mg total) by mouth daily.  30 tablet    . carvedilol (COREG) 25 MG tablet TAKE 1 TABLET BY MOUTH TWICE DAILY WITH A MEAL  60 tablet  5  . cholecalciferol (VITAMIN D) 1000 UNITS tablet Take 1,000 Units by mouth daily.       . furosemide (LASIX) 40 MG tablet TAKE 1 TABLET BY MOUTH ONCE DAILY  30 tablet  6  . isosorbide mononitrate (IMDUR) 30 MG 24 hr tablet Take 1 tablet (30 mg total) by mouth daily.  30 tablet  6  . Multiple Vitamin (MULTIVITAMIN) capsule Take 1 capsule by mouth daily.        . nitroGLYCERIN (NITROSTAT) 0.4 MG SL tablet Place 0.4 mg under the tongue every 5 (five) minutes x 3 doses as needed. For chest pain      . omeprazole (PRILOSEC) 20 MG capsule Take 20 mg by mouth daily.        . Tamsulosin HCl  (FLOMAX) 0.4 MG CAPS TAKE 1 CAPSULE BY MOUTH ONCE DAILY AFTER SUPPER  30 capsule  1  . Travoprost, BAK Free, (TRAVATAN) 0.004 % SOLN ophthalmic solution Place 1 drop into both eyes 2 (two) times daily.      Marland Kitchen DISCONTD: hydrALAZINE (APRESOLINE) 25 MG tablet Take 1 tablet (25 mg total) by mouth 2 (two) times daily.  60 tablet  3  . DISCONTD: metFORMIN (GLUCOPHAGE) 500 MG tablet Take 500-1,000 mg by mouth. 2 tabs in the am, 1 tab in the pm      . DISCONTD: potassium chloride SA (K-DUR,KLOR-CON) 20 MEQ tablet Take 1 tablet (20 mEq total) by mouth daily.  14 tablet  3    No Known Allergies  Past Medical History  Diagnosis Date  . HTN (hypertension)   . History of colon cancer     s/p colon resection  . Aortic stenosis     a. s/p AVR with 23mm Edwards pericardial valve 02/07/12 - post-op course complicated by pleural effusion requring thoracentesis/leg cellulitis 03/2012.  Marland Kitchen CAD (coronary artery disease)     a. s/p NSTEMI 12/2011:  LHC - Ostial left main 20%, ostial LAD 50%, mid 60-70%, ostial D1 40% and mid 40%, D2 70%, ostial circumflex occluded, ostial RCA 80-90%, LVEDP was 42.  b.  s/p CABG x 3 at time of AVR (LiMA-LAD, SVG-2nd daigonal, SVG-PDA) 02/07/12 (post-op course noted above).  . Ischemic cardiomyopathy   . Chronic systolic heart failure     a. TEE 01/2012: EF 25-30%, diffuse hypokinesis. b. Not on ACEI due to renal insufficiency.  Marland Kitchen HLD (hyperlipidemia)   . Stroke 10/01  . GERD (gastroesophageal reflux disease)   . Chronic kidney disease   . Myocardial infarction   . Pleural effusion     a. post-operatively after AVR/CABG s/p thoracentesis 03/2012 yielding 1L serosanguinous fluid.  . Diabetes mellitus     borderline  . Blood transfusion     NO REACTION TO TRANSFUSION  . Cellulitis     a. RLE cellulitis 2 months post-operatively after AVR/CABG - serratia marcessans, tx with I&D/antibiotics  . Mitral regurgitation     Moderate by TEE 01/2012  . Flash pulmonary edema     Post-cath  12/2011, went into acute pulm edema requiring IV lasix and intubation  . Baker's cyst 07/23/12    Incidental finding of LE venous dopplers    Past Surgical History  Procedure Date  . Colon resection 1996  . Esophageal dilation   . Colonoscopy   . Cataract extraction     rt  . Cardiac catheterization     1.3.13  stopped breathing, put on ventilator for 5-6 days  . Eye surgery   . Tonsillectomy   . Aortic valve replacement 02/07/2012    Procedure: AORTIC VALVE REPLACEMENT (AVR);  Surgeon: [name], MD;  Location: [facility] OR;  Service: Open Heart Surgery;  Laterality: N/A;  . Coronary artery bypass graft 02/07/2012    Procedure: CORONARY ARTERY BYPASS GRAFTING (CABG);  Surgeon: [name], MD;  Location: [facility] OR;  Service: Open Heart Surgery;  Laterality: N/A;  CABG x three; using right leg greater saphenous vein harvested endoscopically  . Chest tube insertion 07/24/2012    Procedure: INSERTION PLEURAL DRAINAGE CATHETER;  Surgeon: [name], MD;  Location: [facility] OR;  Service: Thoracic;  Laterality: Left;    History  Smoking status  . Never Smoker   Smokeless tobacco  . Never Used    History  Alcohol Use No    Family History  Problem Relation Age of Onset  . Cancer    . Heart attack    . Diabetes    . Heart failure    . Cancer Mother   . Heart attack Father     Review of Systems: The review of systems is per the HPI.  All other systems were reviewed and are negative.  Physical Exam: BP 142/56  Pulse 52  Ht 5\' 9"  (1.753 m)  Wt 138 lb 12.8 oz (62.959 kg)  BMI 20.50 kg/m2 Patient is very pleasant and in no acute distress. He looks stronger to me. Weight is down 9 pounds since last visit. Skin is warm and dry. Color is normal.  HEENT is unremarkable. Normocephalic/atraumatic. PERRL. Sclera are nonicteric. Neck is supple. No masses. No JVD. Lungs are clear except for some ronchi in the left base. Cardiac exam shows a regular rate and rhythm. Abdomen is soft. Extremities  are without edema. Gait and ROM are intact. No gross neurologic deficits noted.   LABORATORY DATA: PENDING  Echo Study Conclusions  - Left ventricle: The cavity size was normal. Wall thickness was normal. The estimated ejection fraction was 25%. Global hypokinesis with posterior, inferior and inferoseptal akinesis. Features are consistent with a pseudonormal left ventricular filling  pattern, with concomitant abnormal relaxation and increased filling pressure (grade 2 diastolic dysfunction). E/medial e' > 15 suggests LV end diastolic pressure at least 20 mmHg. - Aortic valve: Bioprosthetic aortic valve. No regurgitation. No stenosis. Mean gradient: 6mm Hg (S). Peak gradient: 12mm Hg (S). - Mitral valve: Mild regurgitation. - Left atrium: The atrium was moderately dilated. - Right ventricle: The cavity size was mildly dilated. Systolic function was mildly to moderately reduced. - Right atrium: The atrium was mildly dilated. - Pulmonary arteries: PA systolic pressure 25-29 mmHg. - Systemic veins: IVC measured 2.2 cm with normal respirophasic variation, suggesting RA pressure 6-10 mmHg. - Pericardium, extracardiac: A trivial pericardial effusion was identified.  Lab Results  Component Value Date   WBC 5.3 07/31/2012   HGB 9.5* 07/31/2012   HCT 29.8* 07/31/2012   PLT 171.0 07/31/2012   GLUCOSE 139* 07/31/2012   CHOL 124 12/14/2011   TRIG 69 12/14/2011   HDL 47 12/14/2011   LDLCALC 63 12/14/2011   ALT 12 07/23/2012   AST 14 07/23/2012   NA 134* 07/31/2012   K 4.6 07/31/2012   CL 96 07/31/2012   CREATININE 2.0* 07/31/2012   BUN 35* 07/31/2012   CO2 32 07/31/2012   INR 1.35 07/24/2012   HGBA1C 6.0* 02/06/2012    Assessment / Plan: 1. Systolic heart failure - Repeat echo shows his EF to still be reduced. However, he is doing much better and looks more compensated today. I have increased his hydralazine to TID. He is to see Dr. Daleen Squibb later this month. Could give consideration for increasing the  hydralazine on return.  2. Anemia. We will check a panel today. Will consider referral to hematology for Aranesp.  I think overall he is doing better. We will check labs today. Hydralazine is increased. Will consider referral to hematology. Continue with daily weights and salt restriction. Patient is agreeable to this plan and will call if any problems develop in the interim.

## 2012-08-15 DIAGNOSIS — I5023 Acute on chronic systolic (congestive) heart failure: Secondary | ICD-10-CM | POA: Diagnosis not present

## 2012-08-15 DIAGNOSIS — I2589 Other forms of chronic ischemic heart disease: Secondary | ICD-10-CM | POA: Diagnosis not present

## 2012-08-15 DIAGNOSIS — Z4801 Encounter for change or removal of surgical wound dressing: Secondary | ICD-10-CM | POA: Diagnosis not present

## 2012-08-15 DIAGNOSIS — I10 Essential (primary) hypertension: Secondary | ICD-10-CM | POA: Diagnosis not present

## 2012-08-15 DIAGNOSIS — E119 Type 2 diabetes mellitus without complications: Secondary | ICD-10-CM | POA: Diagnosis not present

## 2012-08-15 DIAGNOSIS — J90 Pleural effusion, not elsewhere classified: Secondary | ICD-10-CM | POA: Diagnosis not present

## 2012-08-16 DIAGNOSIS — E119 Type 2 diabetes mellitus without complications: Secondary | ICD-10-CM | POA: Diagnosis not present

## 2012-08-16 DIAGNOSIS — I10 Essential (primary) hypertension: Secondary | ICD-10-CM | POA: Diagnosis not present

## 2012-08-16 DIAGNOSIS — Z4801 Encounter for change or removal of surgical wound dressing: Secondary | ICD-10-CM | POA: Diagnosis not present

## 2012-08-16 DIAGNOSIS — M6250 Muscle wasting and atrophy, not elsewhere classified, unspecified site: Secondary | ICD-10-CM | POA: Diagnosis not present

## 2012-08-16 DIAGNOSIS — D638 Anemia in other chronic diseases classified elsewhere: Secondary | ICD-10-CM | POA: Diagnosis not present

## 2012-08-16 DIAGNOSIS — I5023 Acute on chronic systolic (congestive) heart failure: Secondary | ICD-10-CM | POA: Diagnosis not present

## 2012-08-16 DIAGNOSIS — I509 Heart failure, unspecified: Secondary | ICD-10-CM | POA: Diagnosis not present

## 2012-08-16 DIAGNOSIS — I2589 Other forms of chronic ischemic heart disease: Secondary | ICD-10-CM | POA: Diagnosis not present

## 2012-08-16 DIAGNOSIS — G8190 Hemiplegia, unspecified affecting unspecified side: Secondary | ICD-10-CM | POA: Diagnosis not present

## 2012-08-16 DIAGNOSIS — J90 Pleural effusion, not elsewhere classified: Secondary | ICD-10-CM | POA: Diagnosis not present

## 2012-08-19 DIAGNOSIS — I2589 Other forms of chronic ischemic heart disease: Secondary | ICD-10-CM | POA: Diagnosis not present

## 2012-08-19 DIAGNOSIS — Z4801 Encounter for change or removal of surgical wound dressing: Secondary | ICD-10-CM | POA: Diagnosis not present

## 2012-08-19 DIAGNOSIS — I5023 Acute on chronic systolic (congestive) heart failure: Secondary | ICD-10-CM | POA: Diagnosis not present

## 2012-08-19 DIAGNOSIS — J90 Pleural effusion, not elsewhere classified: Secondary | ICD-10-CM | POA: Diagnosis not present

## 2012-08-19 DIAGNOSIS — I10 Essential (primary) hypertension: Secondary | ICD-10-CM | POA: Diagnosis not present

## 2012-08-19 DIAGNOSIS — E119 Type 2 diabetes mellitus without complications: Secondary | ICD-10-CM | POA: Diagnosis not present

## 2012-08-21 ENCOUNTER — Telehealth: Payer: Self-pay | Admitting: *Deleted

## 2012-08-21 NOTE — Telephone Encounter (Signed)
Message copied by Awilda Bill on Wed Aug 21, 2012  4:45 PM ------      Message from: Rosalio Macadamia      Created: Sat Aug 17, 2012 10:09 AM       Ok to report. Still waiting on the iron levels. Would like to refer him to hematology. Can we find out where the rest of the results are?

## 2012-08-21 NOTE — Telephone Encounter (Signed)
Called patient regarding labs.  We have results for the basic chemistries but are missing some results from [facility].  Called [facility] and neither the blood nor the requisition were received at [facility].  Vista Mink, CMA

## 2012-08-21 NOTE — Telephone Encounter (Signed)
Message copied by Awilda Bill on Wed Aug 21, 2012  3:42 PM ------      Message from: Rosalio Macadamia      Created: Sat Aug 17, 2012 10:09 AM       Ok to report. Still waiting on the iron levels. Would like to refer him to hematology. Can we find out where the rest of the results are?

## 2012-08-21 NOTE — Telephone Encounter (Signed)
Pt returned call this afternoon and agreed to come in for a re-draw of the iron-TIBC Friday of this week.  Pt will be in around 2:00. This will be a no charge re-draw.  Routing to [employer] for [employer].  Pt aware of lab results. Will still need referral to Hematology once all results are back in.   Vista Mink, CMA

## 2012-08-22 DIAGNOSIS — J90 Pleural effusion, not elsewhere classified: Secondary | ICD-10-CM | POA: Diagnosis not present

## 2012-08-22 DIAGNOSIS — I2589 Other forms of chronic ischemic heart disease: Secondary | ICD-10-CM | POA: Diagnosis not present

## 2012-08-22 DIAGNOSIS — E119 Type 2 diabetes mellitus without complications: Secondary | ICD-10-CM | POA: Diagnosis not present

## 2012-08-22 DIAGNOSIS — I10 Essential (primary) hypertension: Secondary | ICD-10-CM | POA: Diagnosis not present

## 2012-08-22 DIAGNOSIS — Z4801 Encounter for change or removal of surgical wound dressing: Secondary | ICD-10-CM | POA: Diagnosis not present

## 2012-08-22 DIAGNOSIS — I5023 Acute on chronic systolic (congestive) heart failure: Secondary | ICD-10-CM | POA: Diagnosis not present

## 2012-08-22 NOTE — Telephone Encounter (Signed)
Patient coming in for repeat labs Friday 08/23/2012.  Vista Mink, CMA

## 2012-08-23 ENCOUNTER — Other Ambulatory Visit (INDEPENDENT_AMBULATORY_CARE_PROVIDER_SITE_OTHER): Payer: Medicare Other

## 2012-08-23 ENCOUNTER — Other Ambulatory Visit: Payer: Self-pay | Admitting: Surgery

## 2012-08-23 DIAGNOSIS — D619 Aplastic anemia, unspecified: Secondary | ICD-10-CM | POA: Diagnosis not present

## 2012-08-23 DIAGNOSIS — I2510 Atherosclerotic heart disease of native coronary artery without angina pectoris: Secondary | ICD-10-CM

## 2012-08-23 DIAGNOSIS — I5022 Chronic systolic (congestive) heart failure: Secondary | ICD-10-CM | POA: Diagnosis not present

## 2012-08-23 DIAGNOSIS — J90 Pleural effusion, not elsewhere classified: Secondary | ICD-10-CM

## 2012-08-23 DIAGNOSIS — I25810 Atherosclerosis of coronary artery bypass graft(s) without angina pectoris: Secondary | ICD-10-CM | POA: Diagnosis not present

## 2012-08-23 DIAGNOSIS — D539 Nutritional anemia, unspecified: Secondary | ICD-10-CM | POA: Diagnosis not present

## 2012-08-23 DIAGNOSIS — R42 Dizziness and giddiness: Secondary | ICD-10-CM | POA: Diagnosis not present

## 2012-08-23 DIAGNOSIS — R5383 Other fatigue: Secondary | ICD-10-CM | POA: Diagnosis not present

## 2012-08-23 DIAGNOSIS — G589 Mononeuropathy, unspecified: Secondary | ICD-10-CM | POA: Diagnosis not present

## 2012-08-23 LAB — IRON AND TIBC
%SAT: 14 % — ABNORMAL LOW (ref 20–55)
Iron: 51 ug/dL (ref 42–165)
TIBC: 374 ug/dL (ref 215–435)
UIBC: 323 ug/dL (ref 125–400)

## 2012-08-26 ENCOUNTER — Telehealth: Payer: Self-pay | Admitting: *Deleted

## 2012-08-26 DIAGNOSIS — E119 Type 2 diabetes mellitus without complications: Secondary | ICD-10-CM | POA: Diagnosis not present

## 2012-08-26 DIAGNOSIS — J90 Pleural effusion, not elsewhere classified: Secondary | ICD-10-CM | POA: Diagnosis not present

## 2012-08-26 DIAGNOSIS — Z4801 Encounter for change or removal of surgical wound dressing: Secondary | ICD-10-CM | POA: Diagnosis not present

## 2012-08-26 DIAGNOSIS — I5023 Acute on chronic systolic (congestive) heart failure: Secondary | ICD-10-CM | POA: Diagnosis not present

## 2012-08-26 DIAGNOSIS — I2589 Other forms of chronic ischemic heart disease: Secondary | ICD-10-CM | POA: Diagnosis not present

## 2012-08-26 DIAGNOSIS — I10 Essential (primary) hypertension: Secondary | ICD-10-CM | POA: Diagnosis not present

## 2012-08-26 NOTE — Telephone Encounter (Signed)
Message copied by Awilda Bill on Mon Aug 26, 2012  8:38 AM ------      Message from: Rosalio Macadamia      Created: Sat Aug 24, 2012  9:27 AM       Ok to report. Can we get him a referral to hematology to be considered for Aranesp?

## 2012-08-27 ENCOUNTER — Encounter: Payer: Self-pay | Admitting: Surgery

## 2012-08-27 ENCOUNTER — Other Ambulatory Visit: Payer: Self-pay | Admitting: Nurse Practitioner

## 2012-08-27 ENCOUNTER — Ambulatory Visit (INDEPENDENT_AMBULATORY_CARE_PROVIDER_SITE_OTHER): Payer: Medicare Other | Admitting: Surgery

## 2012-08-27 ENCOUNTER — Other Ambulatory Visit: Payer: Self-pay | Admitting: *Deleted

## 2012-08-27 ENCOUNTER — Encounter (HOSPITAL_COMMUNITY): Payer: Self-pay | Admitting: Pharmacy Technician

## 2012-08-27 ENCOUNTER — Ambulatory Visit
Admission: RE | Admit: 2012-08-27 | Discharge: 2012-08-27 | Disposition: A | Discharge status: Home / Self Care | Payer: Medicare Other | Source: Ambulatory Visit | Attending: Surgery | Admitting: Surgery

## 2012-08-27 VITALS — BP 152/58 | HR 52 | Resp 16 | Ht 69.0 in | Wt 134.2 lb

## 2012-08-27 DIAGNOSIS — Z4801 Encounter for change or removal of surgical wound dressing: Secondary | ICD-10-CM | POA: Diagnosis not present

## 2012-08-27 DIAGNOSIS — I10 Essential (primary) hypertension: Secondary | ICD-10-CM | POA: Diagnosis not present

## 2012-08-27 DIAGNOSIS — J90 Pleural effusion, not elsewhere classified: Secondary | ICD-10-CM

## 2012-08-27 DIAGNOSIS — Z951 Presence of aortocoronary bypass graft: Secondary | ICD-10-CM | POA: Diagnosis not present

## 2012-08-27 DIAGNOSIS — E119 Type 2 diabetes mellitus without complications: Secondary | ICD-10-CM | POA: Diagnosis not present

## 2012-08-27 DIAGNOSIS — D649 Anemia, unspecified: Secondary | ICD-10-CM

## 2012-08-27 DIAGNOSIS — I5023 Acute on chronic systolic (congestive) heart failure: Secondary | ICD-10-CM | POA: Diagnosis not present

## 2012-08-27 DIAGNOSIS — Z09 Encounter for follow-up examination after completed treatment for conditions other than malignant neoplasm: Secondary | ICD-10-CM | POA: Diagnosis not present

## 2012-08-27 DIAGNOSIS — I2589 Other forms of chronic ischemic heart disease: Secondary | ICD-10-CM | POA: Diagnosis not present

## 2012-08-27 DIAGNOSIS — J9819 Other pulmonary collapse: Secondary | ICD-10-CM | POA: Diagnosis not present

## 2012-08-27 NOTE — Progress Notes (Signed)
                 [address]            [place] [address]          [phone]      HPI:  He returns today for followup of a left Pleurx catheter placed on 07/23/2012 for recurrent left pleural effusion. The catheter drained 900-950 cc per day from August 24 through August 28 and since has started to decrease. It drained 750 cc on August 29, 825 cc on August 30, and 650 cc on August 31. Then on September 1 it only drained 100 cc but his wife suspects that the nurse did not drain it correctly. The following day on September 2 it drained 1020 cc. On Sept. 3 it drained 550 cc of yellow, serous fluid. Since I last saw him on Sept. 3 it has continued to drain 500 to 600 cc per day. In the office today it was 550cc.The patient said that he has been feeling well otherwise and has had no shortness of breath. His lower extremity edema has continued to decrease.  Current Outpatient Prescriptions   Medication  Sig  Dispense  Refill   .  aspirin EC 325 MG EC tablet  Take 1 tablet (325 mg total) by mouth daily.  30 tablet    .  carvedilol (COREG) 25 MG tablet  TAKE 1 TABLET BY MOUTH TWICE DAILY WITH A MEAL  60 tablet  5   .  cholecalciferol (VITAMIN D) 1000 UNITS tablet  Take 1,000 Units by mouth daily.     .  furosemide (LASIX) 40 MG tablet  TAKE 1 TABLET BY MOUTH ONCE DAILY  30 tablet  6   .  hydrALAZINE (APRESOLINE) 25 MG tablet  Take 1 tablet (25 mg total) by mouth 3 (three) times daily.  90 tablet  3   .  isosorbide mononitrate (IMDUR) 30 MG 24 hr tablet  Take 1 tablet (30 mg total) by mouth daily.  30 tablet  6   .  Multiple Vitamin (MULTIVITAMIN) capsule  Take 1 capsule by mouth daily.     .  nitroGLYCERIN (NITROSTAT) 0.4 MG SL tablet  Place 0.4 mg under the tongue every 5 (five) minutes x 3 doses as needed. For chest pain     .  omeprazole (PRILOSEC) 20 MG capsule  Take 20 mg by mouth daily.     .  Tamsulosin HCl (FLOMAX) 0.4 MG CAPS  TAKE 1 CAPSULE BY MOUTH ONCE DAILY AFTER SUPPER   30 capsule  1   .  Travoprost, BAK Free, (TRAVATAN) 0.004 % SOLN ophthalmic solution  Place 1 drop into both eyes 2 (two) times daily.     .  DISCONTD: metFORMIN (GLUCOPHAGE) 500 MG tablet  Take 500-1,000 mg by mouth. 2 tabs in the am, 1 tab in the pm     .  DISCONTD: potassium chloride SA (K-DUR,KLOR-CON) 20 MEQ tablet  Take 1 tablet (20 mEq total) by mouth daily.  14 tablet  3    Physical Exam:  BP 152/58  Pulse 52  Resp 16  Ht 5' 9" (1.753 m)  Wt 134 lb 3.2 oz (60.873 kg)  BMI 19.82 kg/m2  SpO2 98%  He looks well.  Lung exam reveals decreased breath sounds at left base.  The catheter site has no signs of infection.  Diagnostic Tests:  *RADIOLOGY REPORT*  Clinical Data: CABG 2 weeks ago,   follow-up  CHEST - 2 VIEW  Comparison: Chest x-ray of 08/13/2012  Findings: There is little change in pleural and parenchymal opacity  at the left lung base consistent with effusion and atelectasis. A  left pleurex catheter remains. Cardiomegaly is stable and there  does appear to be mild pulmonary vascular congestion present.  Median sternotomy sutures are noted from CABG and aortic valve  replacement. There are degenerative changes throughout the  thoracic spine.  IMPRESSION:  1. No significant change in the left pleural effusion with left  pleurex catheter remaining. Mild left basilar atelectasis remains.  2. No change in cardiomegaly. Suspect mild pulmonary vascular  congestion.  Original Report Authenticated By: PAUL D. BARRY, M.D.  Impression:  He continues to have 500- 600 cc per day of serous drainage from the left Pleurx catheter. It has been almost 5 weeks since it was inserted. He appears to be euvolemic with no significant peripheral edema and I think his congestive heart failure is well-managed. I think it is best to proceed with talc pleurodesis through the Pleurx catheter. I discussed the procedure with the patient and his wife including alternatives, benefits, and risks including  but not limited to fever, chest pain, systemic inflammatory reaction, failure to resolve the pleural fluid drainage, and catheter malfunction. They understand and would like to proceed with talc pleurodesis.  Plan:  I will schedule talc pleurodesis in short stay on Friday, 08/30/2012.     

## 2012-08-28 ENCOUNTER — Telehealth: Payer: Self-pay | Admitting: Oncology

## 2012-08-28 NOTE — Telephone Encounter (Signed)
S/W in re NP appt 9/25 @ 1:30 w/Dr. Clelia Croft. Referring Dr. Daleen Squibb Dx- Anemia. NP packet mailed.

## 2012-08-28 NOTE — Telephone Encounter (Signed)
C/D on 9/18 for appt 9/25.  °

## 2012-08-29 ENCOUNTER — Encounter (HOSPITAL_COMMUNITY): Payer: Self-pay | Admitting: Vascular Surgery

## 2012-08-29 ENCOUNTER — Encounter (HOSPITAL_COMMUNITY)
Admission: RE | Admit: 2012-08-29 | Discharge: 2012-08-29 | Disposition: A | Discharge status: Home / Self Care | Payer: Medicare Other | Source: Ambulatory Visit | Attending: Surgery | Admitting: Surgery

## 2012-08-29 ENCOUNTER — Encounter (HOSPITAL_COMMUNITY): Payer: Self-pay

## 2012-08-29 VITALS — BP 161/65 | HR 54 | Temp 97.4°F | Resp 20 | Ht 69.0 in | Wt 139.5 lb

## 2012-08-29 DIAGNOSIS — J90 Pleural effusion, not elsewhere classified: Secondary | ICD-10-CM

## 2012-08-29 DIAGNOSIS — E119 Type 2 diabetes mellitus without complications: Secondary | ICD-10-CM | POA: Diagnosis not present

## 2012-08-29 DIAGNOSIS — Z01811 Encounter for preprocedural respiratory examination: Secondary | ICD-10-CM | POA: Diagnosis not present

## 2012-08-29 DIAGNOSIS — I10 Essential (primary) hypertension: Secondary | ICD-10-CM | POA: Diagnosis not present

## 2012-08-29 DIAGNOSIS — Z01818 Encounter for other preprocedural examination: Secondary | ICD-10-CM | POA: Diagnosis not present

## 2012-08-29 DIAGNOSIS — I5023 Acute on chronic systolic (congestive) heart failure: Secondary | ICD-10-CM | POA: Diagnosis not present

## 2012-08-29 DIAGNOSIS — I2589 Other forms of chronic ischemic heart disease: Secondary | ICD-10-CM | POA: Diagnosis not present

## 2012-08-29 DIAGNOSIS — Z01812 Encounter for preprocedural laboratory examination: Secondary | ICD-10-CM | POA: Diagnosis not present

## 2012-08-29 DIAGNOSIS — Z4801 Encounter for change or removal of surgical wound dressing: Secondary | ICD-10-CM | POA: Diagnosis not present

## 2012-08-29 HISTORY — DX: Unspecified cataract: H26.9

## 2012-08-29 LAB — PROTIME-INR
INR: 1.1 (ref 0.00–1.49)
Prothrombin Time: 14.1 seconds (ref 11.6–15.2)

## 2012-08-29 LAB — CBC
MCH: 26.6 pg (ref 26.0–34.0)
MCV: 86.4 fL (ref 78.0–100.0)
Platelets: 144 10*3/uL — ABNORMAL LOW (ref 150–400)
RDW: 16.3 % — ABNORMAL HIGH (ref 11.5–15.5)

## 2012-08-29 LAB — COMPREHENSIVE METABOLIC PANEL
AST: 20 U/L (ref 0–37)
Albumin: 3 g/dL — ABNORMAL LOW (ref 3.5–5.2)
BUN: 39 mg/dL — ABNORMAL HIGH (ref 6–23)
CO2: 32 mEq/L (ref 19–32)
Calcium: 9.5 mg/dL (ref 8.4–10.5)
Creatinine, Ser: 2.03 mg/dL — ABNORMAL HIGH (ref 0.50–1.35)
GFR calc non Af Amer: 28 mL/min — ABNORMAL LOW (ref >90)

## 2012-08-29 LAB — SURGICAL PCR SCREEN: MRSA, PCR: NEGATIVE

## 2012-08-29 MED ORDER — DEXTROSE 5 % IV SOLN
1.5000 g | INTRAVENOUS | Status: DC
  Filled 2012-08-29: qty 1.5

## 2012-08-29 NOTE — Progress Notes (Signed)
Confirmed with Dereck Leep, RN. That patient needed preop workup as ordered.

## 2012-08-29 NOTE — Pre-Procedure Instructions (Signed)
20 [name] [name]  08/29/2012   Your procedure is scheduled on:  Friday August 30, 2012  Report to [facility] Short Stay Center at 7:30 AM.  Call this number if you have problems the morning of surgery: [phone]   Remember:   Do not eat food or drink:After Midnight.      Take these medicines the morning of surgery with A SIP OF WATER: carvedilol, isosorbide, omeprazole, flomax, apresoline   Do not wear jewelry, make-up or nail polish.  Do not wear lotions, powders, or perfumes. You may wear deodorant.  Do not shave 48 hours prior to surgery. Men may shave face and neck.  Do not bring valuables to the hospital.  Contacts, dentures or bridgework may not be worn into surgery.  Leave suitcase in the car. After surgery it may be brought to your room.  For patients admitted to the hospital, checkout time is 11:00 AM the day of discharge.   Patients discharged the day of surgery will not be allowed to drive home.  Name and phone number of your driver: family / friend  Special Instructions: CHG Shower Use Special Wash: 1/2 bottle night before surgery and 1/2 bottle morning of surgery.   Please read over the following fact sheets that you were given: Pain Booklet, Coughing and Deep Breathing, MRSA Information and Surgical Site Infection Prevention

## 2012-08-29 NOTE — Progress Notes (Signed)
Revonda Standard reviewed today's labs, noted that they appeared stable in comparison with 08/14/12.

## 2012-08-29 NOTE — Progress Notes (Addendum)
Contacted Revonda Standard with Dr. Sharee Pimple office states okay for patient to arrive for procedure at 10:00am/short stay. Patient notified.

## 2012-08-30 ENCOUNTER — Encounter (HOSPITAL_COMMUNITY)
Admission: RE | Disposition: A | Discharge status: Home / Self Care | Payer: Self-pay | Source: Ambulatory Visit | Attending: Surgery

## 2012-08-30 ENCOUNTER — Other Ambulatory Visit: Payer: Self-pay | Admitting: Surgery

## 2012-08-30 ENCOUNTER — Other Ambulatory Visit: Payer: Self-pay | Admitting: *Deleted

## 2012-08-30 ENCOUNTER — Ambulatory Visit (HOSPITAL_COMMUNITY)
Admission: RE | Admit: 2012-08-30 | Discharge: 2012-08-30 | Disposition: A | Discharge status: Home / Self Care | Payer: Medicare Other | Source: Ambulatory Visit | Attending: Surgery | Admitting: Surgery

## 2012-08-30 ENCOUNTER — Other Ambulatory Visit: Payer: Self-pay | Admitting: Physician Assistant

## 2012-08-30 DIAGNOSIS — J90 Pleural effusion, not elsewhere classified: Secondary | ICD-10-CM

## 2012-08-30 DIAGNOSIS — Z01818 Encounter for other preprocedural examination: Secondary | ICD-10-CM | POA: Insufficient documentation

## 2012-08-30 DIAGNOSIS — I2510 Atherosclerotic heart disease of native coronary artery without angina pectoris: Secondary | ICD-10-CM

## 2012-08-30 DIAGNOSIS — Z01812 Encounter for preprocedural laboratory examination: Secondary | ICD-10-CM | POA: Insufficient documentation

## 2012-08-30 HISTORY — PX: TALC PLEURODESIS: SHX2506

## 2012-08-30 SURGERY — PLEURODESIS, USING TALC
Anesthesia: LOCAL | Laterality: Left | Wound class: Clean Contaminated

## 2012-08-30 MED ORDER — TALC EX POWD: CUTANEOUS | Status: DC | PRN

## 2012-08-30 MED ORDER — TALC 5 G PL SUSR
3.0000 g | Freq: Once | INTRAVENOUS | Status: DC
  Filled 2012-08-30: qty 3

## 2012-08-30 MED ORDER — OXYCODONE HCL 5 MG PO TABS: 5.0000 mg | ORAL_TABLET | ORAL | Status: DC | PRN

## 2012-08-30 MED ORDER — TALC 5 G PL SUSR: 3.0000 g | Freq: Once | INTRAVENOUS | Status: DC

## 2012-08-30 NOTE — Progress Notes (Signed)
S:  [name] [name] presents today for talc pleurodesis.  He states overall he is doing well. He denies chest pain, shortness of breath, PND, orthopnea.    O: BP 141/55  Pulse 54  Temp 97.8 F (36.6 C) (Oral)  Resp 18  SpO2 98%  Gen: no apparent distress Heart: RRR Lungs: decreased BS left base Abd: soft non-tender, non-distended Skin: chest tube site clean and dry Neuro: grossly intact  CXR: 9/19 stable in appearance + LLL atelectasis, minimal left pleural fluid present  A/P:  1. Talc pleurodesis today- patient educated on possible side effects of fever and pain associated with procedure.  He was instructed to contact our office should his fever persist for several day 2. RTC as scheduled on 09/03/2012 at 10:00AM 3. D/C home today

## 2012-09-02 DIAGNOSIS — E1159 Type 2 diabetes mellitus with other circulatory complications: Secondary | ICD-10-CM | POA: Diagnosis not present

## 2012-09-02 DIAGNOSIS — R972 Elevated prostate specific antigen [PSA]: Secondary | ICD-10-CM | POA: Diagnosis not present

## 2012-09-02 DIAGNOSIS — I10 Essential (primary) hypertension: Secondary | ICD-10-CM | POA: Diagnosis not present

## 2012-09-02 DIAGNOSIS — E785 Hyperlipidemia, unspecified: Secondary | ICD-10-CM | POA: Diagnosis not present

## 2012-09-02 NOTE — Telephone Encounter (Signed)
09-02-12  Schedule with Dr. Clelia Croft @ 2pm  Pt is aware of the appointment.

## 2012-09-02 NOTE — H&P (Signed)
[address]            [name] [address]          [phone]      HPI:  He returns today for followup of a left Pleurx catheter placed on 07/23/2012 for recurrent left pleural effusion. The catheter drained 900-950 cc per day from August 24 through August 28 and since has started to decrease. It drained 750 cc on August 29, 825 cc on August 30, and 650 cc on August 31. Then on September 1 it only drained 100 cc but his wife suspects that the nurse did not drain it correctly. The following day on September 2 it drained 1020 cc. On Sept. 3 it drained 550 cc of yellow, serous fluid. Since I last saw him on Sept. 3 it has continued to drain 500 to 600 cc per day. In the office today it was 550cc.The patient said that he has been feeling well otherwise and has had no shortness of breath. His lower extremity edema has continued to decrease.  Current Outpatient Prescriptions   Medication  Sig  Dispense  Refill   .  aspirin EC 325 MG EC tablet  Take 1 tablet (325 mg total) by mouth daily.  30 tablet    .  carvedilol (COREG) 25 MG tablet  TAKE 1 TABLET BY MOUTH TWICE DAILY WITH A MEAL  60 tablet  5   .  cholecalciferol (VITAMIN D) 1000 UNITS tablet  Take 1,000 Units by mouth daily.     .  furosemide (LASIX) 40 MG tablet  TAKE 1 TABLET BY MOUTH ONCE DAILY  30 tablet  6   .  hydrALAZINE (APRESOLINE) 25 MG tablet  Take 1 tablet (25 mg total) by mouth 3 (three) times daily.  90 tablet  3   .  isosorbide mononitrate (IMDUR) 30 MG 24 hr tablet  Take 1 tablet (30 mg total) by mouth daily.  30 tablet  6   .  Multiple Vitamin (MULTIVITAMIN) capsule  Take 1 capsule by mouth daily.     .  nitroGLYCERIN (NITROSTAT) 0.4 MG SL tablet  Place 0.4 mg under the tongue every 5 (five) minutes x 3 doses as needed. For chest pain     .  omeprazole (PRILOSEC) 20 MG capsule  Take 20 mg by mouth daily.     .  Tamsulosin HCl (FLOMAX) 0.4 MG CAPS  TAKE 1 CAPSULE BY MOUTH ONCE DAILY AFTER SUPPER   30 capsule  1   .  Travoprost, BAK Free, (TRAVATAN) 0.004 % SOLN ophthalmic solution  Place 1 drop into both eyes 2 (two) times daily.     Marland Kitchen  DISCONTD: metFORMIN (GLUCOPHAGE) 500 MG tablet  Take 500-1,000 mg by mouth. 2 tabs in the am, 1 tab in the pm     .  DISCONTD: potassium chloride SA (K-DUR,KLOR-CON) 20 MEQ tablet  Take 1 tablet (20 mEq total) by mouth daily.  14 tablet  3    Physical Exam:  BP 152/58  Pulse 52  Resp 16  Ht 5\' 9"  (1.753 m)  Wt 134 lb 3.2 oz (60.873 kg)  BMI 19.82 kg/m2  SpO2 98%  He looks well.  Lung exam reveals decreased breath sounds at left base.  The catheter site has no signs of infection.  Diagnostic Tests:  *RADIOLOGY REPORT*  Clinical Data: CABG 2 weeks ago,  follow-up  CHEST - 2 VIEW  Comparison: Chest x-ray of 08/13/2012  Findings: There is little change in pleural and parenchymal opacity  at the left lung base consistent with effusion and atelectasis. A  left pleurex catheter remains. Cardiomegaly is stable and there  does appear to be mild pulmonary vascular congestion present.  Median sternotomy sutures are noted from CABG and aortic valve  replacement. There are degenerative changes throughout the  thoracic spine.  IMPRESSION:  1. No significant change in the left pleural effusion with left  pleurex catheter remaining. Mild left basilar atelectasis remains.  2. No change in cardiomegaly. Suspect mild pulmonary vascular  congestion.  Original Report Authenticated By: Juline Patch, M.D.  Impression:  He continues to have 500- 600 cc per day of serous drainage from the left Pleurx catheter. It has been almost 5 weeks since it was inserted. He appears to be euvolemic with no significant peripheral edema and I think his congestive heart failure is well-managed. I think it is best to proceed with talc pleurodesis through the Pleurx catheter. I discussed the procedure with the patient and his wife including alternatives, benefits, and risks including  but not limited to fever, chest pain, systemic inflammatory reaction, failure to resolve the pleural fluid drainage, and catheter malfunction. They understand and would like to proceed with talc pleurodesis.  Plan:  I will schedule talc pleurodesis in short stay on Friday, 08/30/2012.

## 2012-09-03 ENCOUNTER — Ambulatory Visit (INDEPENDENT_AMBULATORY_CARE_PROVIDER_SITE_OTHER): Payer: Medicare Other | Admitting: Surgery

## 2012-09-03 ENCOUNTER — Other Ambulatory Visit: Payer: Self-pay | Admitting: Oncology

## 2012-09-03 ENCOUNTER — Ambulatory Visit
Admission: RE | Admit: 2012-09-03 | Discharge: 2012-09-03 | Disposition: A | Discharge status: Home / Self Care | Payer: Medicare Other | Source: Ambulatory Visit | Attending: Surgery | Admitting: Surgery

## 2012-09-03 ENCOUNTER — Encounter: Payer: Self-pay | Admitting: Surgery

## 2012-09-03 VITALS — BP 120/54 | HR 51 | Resp 18 | Ht 69.0 in | Wt 142.0 lb

## 2012-09-03 DIAGNOSIS — Z09 Encounter for follow-up examination after completed treatment for conditions other than malignant neoplasm: Secondary | ICD-10-CM

## 2012-09-03 DIAGNOSIS — Z4801 Encounter for change or removal of surgical wound dressing: Secondary | ICD-10-CM | POA: Diagnosis not present

## 2012-09-03 DIAGNOSIS — E119 Type 2 diabetes mellitus without complications: Secondary | ICD-10-CM | POA: Diagnosis not present

## 2012-09-03 DIAGNOSIS — J9819 Other pulmonary collapse: Secondary | ICD-10-CM | POA: Diagnosis not present

## 2012-09-03 DIAGNOSIS — I5023 Acute on chronic systolic (congestive) heart failure: Secondary | ICD-10-CM | POA: Diagnosis not present

## 2012-09-03 DIAGNOSIS — I10 Essential (primary) hypertension: Secondary | ICD-10-CM | POA: Diagnosis not present

## 2012-09-03 DIAGNOSIS — J90 Pleural effusion, not elsewhere classified: Secondary | ICD-10-CM

## 2012-09-03 DIAGNOSIS — D649 Anemia, unspecified: Secondary | ICD-10-CM

## 2012-09-03 DIAGNOSIS — Z23 Encounter for immunization: Secondary | ICD-10-CM | POA: Diagnosis not present

## 2012-09-03 DIAGNOSIS — I2589 Other forms of chronic ischemic heart disease: Secondary | ICD-10-CM | POA: Diagnosis not present

## 2012-09-03 NOTE — Progress Notes (Signed)
[address]            [name] [address]          [phone]       HPI:  The patient returns today for followup of his left Pleurx catheter. He had talc instillation last Friday. He did not drain the catheter on Saturday. On Sunday it drained 1050 cc. Yesterday it drained 475 cc. Today in the office it drained 325 cc. The patient said that he did well with the talc although his wife notes that he had a temperature of 102 and slept most of the day Saturday. He denies any cough, sputum production, chest pain, or shortness of breath. His wife notes that his weight gradually went up to 139.4 pounds on Sunday and therefore she started giving him some extra Lasix as instructed by the cardiologist. His weight is now starting to decrease again and was 136.6 today.  Current Outpatient Prescriptions  Medication Sig Dispense Refill  . aspirin EC 325 MG EC tablet Take 1 tablet (325 mg total) by mouth daily.  30 tablet    . carvedilol (COREG) 25 MG tablet Take 25 mg by mouth 2 (two) times daily with a meal.      . cholecalciferol (VITAMIN D) 1000 UNITS tablet Take 1,000 Units by mouth daily.       . furosemide (LASIX) 40 MG tablet Take 40 mg by mouth daily.      . hydrALAZINE (APRESOLINE) 25 MG tablet Take 1 tablet (25 mg total) by mouth 3 (three) times daily.  90 tablet  3  . isosorbide mononitrate (IMDUR) 30 MG 24 hr tablet Take 1 tablet (30 mg total) by mouth daily.  30 tablet  6  . Multiple Vitamin (MULTIVITAMIN) capsule Take 1 capsule by mouth daily.        . nitroGLYCERIN (NITROSTAT) 0.4 MG SL tablet Place 0.4 mg under the tongue every 5 (five) minutes x 3 doses as needed. For chest pain      . omeprazole (PRILOSEC) 20 MG capsule Take 20 mg by mouth daily.        Marland Kitchen oxyCODONE (OXY IR/ROXICODONE) 5 MG immediate release tablet Take 1 tablet (5 mg total) by mouth every 4 (four) hours as needed for pain.  20 tablet  0  . sodium chloride 0.9 % SOLN 60 mL with talc 5 G  SUSR 3 g 3 g by Intrapleural route once.  1 Bottle  0  . Tamsulosin HCl (FLOMAX) 0.4 MG CAPS Take 0.4 mg by mouth daily after supper.      . Travoprost, BAK Free, (TRAVATAN) 0.004 % SOLN ophthalmic solution Place 1 drop into both eyes 2 (two) times daily.      Marland Kitchen DISCONTD: metFORMIN (GLUCOPHAGE) 500 MG tablet Take 500-1,000 mg by mouth. 2 tabs in the am, 1 tab in the pm      . DISCONTD: potassium chloride SA (K-DUR,KLOR-CON) 20 MEQ tablet Take 1 tablet (20 mEq total) by mouth daily.  14 tablet  3     Physical Exam: BP 120/54  Pulse 51  Resp 18  Ht 5\' 9"  (1.753 m)  Wt 142 lb (64.411 kg)  BMI 20.97 kg/m2  SpO2 97% He looks well. Lung exam reveals slight decreased breath sounds at left base. The catheter site looks fine  Diagnostic Tests:  *RADIOLOGY REPORT*   Clinical Data: Follow-up pleural  effusion.  Cardiac surgery in February.   CHEST - 2 VIEW   Comparison: 08/29/2012 and 08/27/2012.   Findings: The pleural drainage catheter inferiorly in the left hemithorax is unchanged in position.  There has been some reaccumulation of left pleural fluid. There is associated mildly increased left basilar atelectasis.  No pneumothorax is seen.  The right lung is clear.  There is no right-sided pleural effusion. Heart size and mediastinal contours are stable status post CABG and aortic valve replacement.   IMPRESSION: Reaccumulation of small left pleural effusion with associated left basilar atelectasis.     Original Report Authenticated By: Gerrianne Scale, M.D.     Impression/Plan:  He is doing well following installation of talc for recurrent pleural effusion. He will continue once daily drainage and I will plan to see him back in the office next Tuesday for follow up.

## 2012-09-04 ENCOUNTER — Other Ambulatory Visit (HOSPITAL_BASED_OUTPATIENT_CLINIC_OR_DEPARTMENT_OTHER): Payer: Medicare Other | Admitting: Lab

## 2012-09-04 ENCOUNTER — Telehealth: Payer: Self-pay | Admitting: Oncology

## 2012-09-04 ENCOUNTER — Ambulatory Visit: Payer: Medicare Other

## 2012-09-04 ENCOUNTER — Ambulatory Visit (HOSPITAL_BASED_OUTPATIENT_CLINIC_OR_DEPARTMENT_OTHER): Payer: Medicare Other | Admitting: Oncology

## 2012-09-04 VITALS — BP 133/63 | HR 52 | Temp 97.6°F | Resp 20 | Ht 69.0 in | Wt 140.8 lb

## 2012-09-04 DIAGNOSIS — I214 Non-ST elevation (NSTEMI) myocardial infarction: Secondary | ICD-10-CM

## 2012-09-04 DIAGNOSIS — L02419 Cutaneous abscess of limb, unspecified: Secondary | ICD-10-CM

## 2012-09-04 DIAGNOSIS — I5023 Acute on chronic systolic (congestive) heart failure: Secondary | ICD-10-CM

## 2012-09-04 DIAGNOSIS — D638 Anemia in other chronic diseases classified elsewhere: Secondary | ICD-10-CM

## 2012-09-04 DIAGNOSIS — R5381 Other malaise: Secondary | ICD-10-CM

## 2012-09-04 DIAGNOSIS — N179 Acute kidney failure, unspecified: Secondary | ICD-10-CM

## 2012-09-04 DIAGNOSIS — N189 Chronic kidney disease, unspecified: Secondary | ICD-10-CM

## 2012-09-04 DIAGNOSIS — I10 Essential (primary) hypertension: Secondary | ICD-10-CM

## 2012-09-04 DIAGNOSIS — I5022 Chronic systolic (congestive) heart failure: Secondary | ICD-10-CM

## 2012-09-04 DIAGNOSIS — I255 Ischemic cardiomyopathy: Secondary | ICD-10-CM

## 2012-09-04 DIAGNOSIS — D649 Anemia, unspecified: Secondary | ICD-10-CM

## 2012-09-04 DIAGNOSIS — E785 Hyperlipidemia, unspecified: Secondary | ICD-10-CM

## 2012-09-04 DIAGNOSIS — J90 Pleural effusion, not elsewhere classified: Secondary | ICD-10-CM

## 2012-09-04 DIAGNOSIS — E876 Hypokalemia: Secondary | ICD-10-CM

## 2012-09-04 DIAGNOSIS — R5383 Other fatigue: Secondary | ICD-10-CM | POA: Diagnosis not present

## 2012-09-04 DIAGNOSIS — I2510 Atherosclerotic heart disease of native coronary artery without angina pectoris: Secondary | ICD-10-CM

## 2012-09-04 LAB — CBC WITH DIFFERENTIAL/PLATELET
Basophils Absolute: 0 10*3/uL (ref 0.0–0.1)
EOS%: 2.5 % (ref 0.0–7.0)
Eosinophils Absolute: 0.1 10*3/uL (ref 0.0–0.5)
HCT: 30.2 % — ABNORMAL LOW (ref 38.4–49.9)
HGB: 9.7 g/dL — ABNORMAL LOW (ref 13.0–17.1)
MCH: 27.5 pg (ref 27.2–33.4)
MCV: 85.7 fL (ref 79.3–98.0)
MONO%: 8.9 % (ref 0.0–14.0)
NEUT#: 2.6 10*3/uL (ref 1.5–6.5)
NEUT%: 58.7 % (ref 39.0–75.0)
Platelets: 172 10*3/uL (ref 140–400)

## 2012-09-04 LAB — COMPREHENSIVE METABOLIC PANEL (CC13)
AST: 18 U/L (ref 5–34)
Albumin: 2.7 g/dL — ABNORMAL LOW (ref 3.5–5.0)
Alkaline Phosphatase: 73 U/L (ref 40–150)
BUN: 41 mg/dL — ABNORMAL HIGH (ref 7.0–26.0)
Calcium: 9.3 mg/dL (ref 8.4–10.4)
Creatinine: 2.2 mg/dL — ABNORMAL HIGH (ref 0.7–1.3)
Glucose: 148 mg/dl — ABNORMAL HIGH (ref 70–99)

## 2012-09-04 MED ORDER — DARBEPOETIN ALFA-POLYSORBATE 300 MCG/0.6ML IJ SOLN
300.0000 ug | Freq: Once | INTRAMUSCULAR | Status: AC
  Administered 2012-09-04: 300 ug via SUBCUTANEOUS
  Filled 2012-09-04: qty 0.6

## 2012-09-04 NOTE — Progress Notes (Signed)
Note dictated

## 2012-09-04 NOTE — Telephone Encounter (Signed)
Gave pt appt for October 2013 lab and Injection and November lab and ML

## 2012-09-05 DIAGNOSIS — E1159 Type 2 diabetes mellitus with other circulatory complications: Secondary | ICD-10-CM | POA: Diagnosis not present

## 2012-09-05 DIAGNOSIS — I10 Essential (primary) hypertension: Secondary | ICD-10-CM | POA: Diagnosis not present

## 2012-09-05 DIAGNOSIS — I5023 Acute on chronic systolic (congestive) heart failure: Secondary | ICD-10-CM | POA: Diagnosis not present

## 2012-09-05 DIAGNOSIS — Z4801 Encounter for change or removal of surgical wound dressing: Secondary | ICD-10-CM | POA: Diagnosis not present

## 2012-09-05 DIAGNOSIS — I2589 Other forms of chronic ischemic heart disease: Secondary | ICD-10-CM | POA: Diagnosis not present

## 2012-09-05 DIAGNOSIS — J90 Pleural effusion, not elsewhere classified: Secondary | ICD-10-CM | POA: Diagnosis not present

## 2012-09-05 DIAGNOSIS — I5043 Acute on chronic combined systolic (congestive) and diastolic (congestive) heart failure: Secondary | ICD-10-CM | POA: Diagnosis not present

## 2012-09-05 DIAGNOSIS — I2510 Atherosclerotic heart disease of native coronary artery without angina pectoris: Secondary | ICD-10-CM | POA: Diagnosis not present

## 2012-09-05 DIAGNOSIS — E119 Type 2 diabetes mellitus without complications: Secondary | ICD-10-CM | POA: Diagnosis not present

## 2012-09-05 NOTE — Progress Notes (Signed)
CC:   Thomas C. Wall, MD, FACC  REASON FOR CONSULTATION:  Anemia.  HISTORY OF PRESENT ILLNESS:  [name] is a pleasant 78 year old gentleman of [place], lived the majority of his life around this area.  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  He is a gentleman with past medical history significant for coronary artery disease.  He is status post an MI and a bypass graft in February of 2013.  The patient has developed recurrent pleural effusion and currently has a PleurX catheter under the care of Dr. Laneta Simmers.  He is also following up with Dr. Valera Castle from Cardiology and has been noted to be anemic for quite a while at this time.  His hemoglobin has ranged between 9 and 10 for the last year since his cardiac operation. His most recent CBC on 08/14/2012 showed his white cell count was 4.8, __________ of 10.0, platelet count of 190, MCV 85.  He had a normal differential.  His chemistries show that his creatinine is 2.1 with a creatinine clearance about 30 cc/minute.  He had normal calcium.  The patient had iron studies that showed an iron level of 51 but saturation of about 14%.  Clinically, he is improving but very slowly since this operation, since his pleural effusion had been drained via the PleurX catheter.  He has had a significant improvement in his performance status and activity level.  He does report some fatigue and tiredness but did not report any chest pain.  He did not report any hematochezia. He did not report any melena.  He did not report any other constitutional symptoms.  His weight is stable.  His appetite is reasonable.  He had not had any recent illnesses or hospitalizations.  REVIEW OF SYSTEMS:  He does not report any headaches, blurry vision, double vision.  He does not report any motor or sensory neuropathy.  He does not report any alteration of mental status.  He  does not report any psychiatric issues, depression.  He does not report any fever, chills, sweats.  He does not report any cough, hemoptysis, hematemesis.  No nausea, vomiting.  He does not report any abdominal pain, hematochezia, melena, genitourinary complaints.  Rest of review of systems unremarkable.  PAST MEDICAL HISTORY:  Significant for hypertension, coronary artery disease.  He has diabetes, chronic __________ insufficiency.  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  MEDICATION:  He is on aspirin, carvedilol, vitamin D, Lasix, hydralazine, isosorbide mononitrate, multivitamin, nitroglycerin, omeprazole, oxycodone, Flomax, Travatan ophthalmic solution.  ALLERGIES:  None.  SOCIAL HISTORY:  He is married.  He has 3 children.  Denied any alcohol or tobacco abuse.  FAMILY HISTORY:  Really no history of any blood disorders or any malignancy.  Father had an MI and mother had a head and neck tumor from what he recalls.  PHYSICAL EXAMINATION:  Alert, awake, pleasant gentleman, appeared in no active distress  today.  His blood pressure is 133/63, pulse 52, respiration 20, temperature is 97.  HEENT:  Head is normocephalic, atraumatic.  Pupils equal, round, reactive to light.  Oral mucosa moist and pink.  Neck:  Supple without adenopathy.  Heart:  Regular rate and rhythm.  S1, S2.  Lungs:  Clear to auscultation.  Abdomen:  Soft, nontender.  No hepatosplenomegaly.  He did have decreased breath sounds at the base  of his left lung.  Extremities:  Had no clubbing, cyanosis, or edema.  Neurologic:  Intact motor, sensory and deep tendon reflexes.  LABORATORY DATA:  Showed a hemoglobin of 9.7, white cell count 4.5, platelet count 172.  ASSESSMENT AND PLAN:  78 year old gentleman with the following issues: Multifactorial anemia.  His anemia is normocytic normochromic.  He does have an element of chronic disease, probably has an element of  anemia of renal insufficiency.  He has a creatinine clearance of about 30 cc/minute.  He might have also an element of iron deficiency and maybe some vitamin B12 or folate deficiency.  For that reason, I am checking those parameters to make sure those are adequately addressed.  Other etiologies such as a plasma cell disorder, myelodysplastic syndrome are always a possibility and we are checking for that.  I will review his peripheral smear.  At this point, I think that is less likely scenario. In terms of the treatment, he certainly could be a candidate for erythropoietin replacement therapy.  We will check his erythropoietin to make sure that it is inappropriately low or normal and, based on that, I have discussed with him today the risks and benefit of appropriate replacement, complications with injection-related toxicities, hypertension, thromboembolic disease, coronary disease complications and he is willing to proceed and we will schedule him every 3 weeks.  We will assess him at the beginning of November to see if there is any benefit symptomatically from his disease.  If he has really no benefit at this time, we can certainly consider a bone marrow biopsy if he has any other cytopenias as well.  All his questions were answered today.    ______________________________ Benjiman Core, M.D. FNS/MEDQ  D:  09/04/2012  T:  09/04/2012  Job:  [id number]

## 2012-09-06 ENCOUNTER — Encounter: Payer: Medicare Other | Admitting: Cardiology

## 2012-09-06 LAB — SPEP & IFE WITH QIG
Albumin ELP: 48.5 % — ABNORMAL LOW (ref 55.8–66.1)
Alpha-1-Globulin: 6.9 % — ABNORMAL HIGH (ref 2.9–4.9)
Alpha-2-Globulin: 13.3 % — ABNORMAL HIGH (ref 7.1–11.8)
Gamma Globulin: 17 % (ref 11.1–18.8)
IgM, Serum: 76 mg/dL (ref 41–251)

## 2012-09-06 LAB — IRON AND TIBC
%SAT: 11 % — ABNORMAL LOW (ref 20–55)
Iron: 36 ug/dL — ABNORMAL LOW (ref 42–165)
UIBC: 295 ug/dL (ref 125–400)

## 2012-09-06 LAB — FERRITIN: Ferritin: 79 ng/mL (ref 22–322)

## 2012-09-06 LAB — ERYTHROPOIETIN: Erythropoietin: 32.5 m[IU]/mL (ref 2.6–34.0)

## 2012-09-09 DIAGNOSIS — I10 Essential (primary) hypertension: Secondary | ICD-10-CM | POA: Diagnosis not present

## 2012-09-09 DIAGNOSIS — I2589 Other forms of chronic ischemic heart disease: Secondary | ICD-10-CM | POA: Diagnosis not present

## 2012-09-09 DIAGNOSIS — J90 Pleural effusion, not elsewhere classified: Secondary | ICD-10-CM | POA: Diagnosis not present

## 2012-09-09 DIAGNOSIS — E119 Type 2 diabetes mellitus without complications: Secondary | ICD-10-CM | POA: Diagnosis not present

## 2012-09-09 DIAGNOSIS — Z4801 Encounter for change or removal of surgical wound dressing: Secondary | ICD-10-CM | POA: Diagnosis not present

## 2012-09-09 DIAGNOSIS — I5023 Acute on chronic systolic (congestive) heart failure: Secondary | ICD-10-CM | POA: Diagnosis not present

## 2012-09-10 ENCOUNTER — Ambulatory Visit (INDEPENDENT_AMBULATORY_CARE_PROVIDER_SITE_OTHER): Payer: Medicare Other | Admitting: Surgery

## 2012-09-10 ENCOUNTER — Other Ambulatory Visit: Payer: Self-pay

## 2012-09-10 ENCOUNTER — Encounter (HOSPITAL_COMMUNITY): Payer: Self-pay | Admitting: Pharmacy Technician

## 2012-09-10 ENCOUNTER — Encounter: Payer: Self-pay | Admitting: Surgery

## 2012-09-10 ENCOUNTER — Encounter: Payer: Self-pay | Admitting: Physician Assistant

## 2012-09-10 ENCOUNTER — Ambulatory Visit (INDEPENDENT_AMBULATORY_CARE_PROVIDER_SITE_OTHER): Payer: Medicare Other | Admitting: Physician Assistant

## 2012-09-10 VITALS — BP 127/60 | HR 53 | Resp 16 | Ht 69.0 in | Wt 140.0 lb

## 2012-09-10 VITALS — BP 112/46 | HR 47 | Ht 69.0 in | Wt 138.0 lb

## 2012-09-10 DIAGNOSIS — Z09 Encounter for follow-up examination after completed treatment for conditions other than malignant neoplasm: Secondary | ICD-10-CM

## 2012-09-10 DIAGNOSIS — J90 Pleural effusion, not elsewhere classified: Secondary | ICD-10-CM

## 2012-09-10 DIAGNOSIS — I2589 Other forms of chronic ischemic heart disease: Secondary | ICD-10-CM | POA: Diagnosis not present

## 2012-09-10 DIAGNOSIS — E785 Hyperlipidemia, unspecified: Secondary | ICD-10-CM

## 2012-09-10 DIAGNOSIS — I2510 Atherosclerotic heart disease of native coronary artery without angina pectoris: Secondary | ICD-10-CM

## 2012-09-10 DIAGNOSIS — I5022 Chronic systolic (congestive) heart failure: Secondary | ICD-10-CM

## 2012-09-10 DIAGNOSIS — N189 Chronic kidney disease, unspecified: Secondary | ICD-10-CM | POA: Diagnosis not present

## 2012-09-10 MED ORDER — FUROSEMIDE 40 MG PO TABS: 40.0000 mg | ORAL_TABLET | Freq: Two times a day (BID) | ORAL | Status: DC

## 2012-09-10 NOTE — Patient Instructions (Addendum)
Your physician recommends that you continue on your current medications as directed. Please refer to the Current Medication list given to you today. I sent in your lasix refill for #60 with 11 refills  Your physician recommends that you schedule a follow-up appointment in: 6-8 weeks with Dr. Daleen Squibb

## 2012-09-10 NOTE — Patient Instructions (Signed)
Plan talc pleurodesis in short stay on Thursday 09/12/12 early afternoon. My office will schedule the time and notify you. Do not drain the catheter that day.

## 2012-09-10 NOTE — Progress Notes (Signed)
[address] [place], [place]  [address] Phone: [phone] Fax:  [phone]  Date:  09/10/2012   Name:  [name] [name]   DOB:  September 17, 1926   MRN:  [id number]  PCP:  Bufford Spikes, DO  Primary Cardiologist:  Dr. Valera Castle  Primary Electrophysiologist:  None    History of Present Illness: [name] [name] is a 78 y.o. male who returns for follow up.  He has a hx of aortic stenosis, CAD, s/p NSTEMI 12/2011 with flash pulmonary edema, ischemic cardiomyopathy with EF 25-30%, systolic CHF, HTN, prior stroke, CKD, HL, DM2, anemia. He eventually underwent tissue AVR+CABG 01/2012.  Postoperative course complicated by pleural effusion requiring thoracentesis as well as LE cellulitis.  He was re-admitted in 8/13 with a/c systolic CHF and recurring left pleural effusion.  He had PleurX catheter placed and has been followed closely by Dr. Laneta Simmers.  He has subsequently undergone talc pleurodesis through his catheter with plans for repeat procedure this week.  He has persistent anemia and is now being seen by Dr. Clelia Croft and receiving Aranesp injections.  He had a follow up echo 08/06/12: EF 25%, mod diast dysfxn, AVR ok, mild MR, mod LAE, mild RAE, mild to mod RV systolic dysfunction.  He is not on ACE or ARB due to CKD.  Last seen by L. Gerhardt 08/14/12.  Since then, he is doing well.  Denies chest pain, significant DOE, orthopnea, PND, LE edema.  No syncope.  Still has significant drainage from his PleurX catheter.  His LE weakness is better off his statin.  He is seeing neurology.  Plan is to proceed with PT.    Labs (3/13):  K 3.8, creatinine 1.86, Hgb 10.5 Labs (5/13):  K 4.4, creatinine 2.11 Labs (8/13):  K 4.6, creatinine 2.0 Labs (9/13):  K 4.7, creatinine 2.2, Hgb 9.7   Wt Readings from Last 3 Encounters:  09/10/12 138 lb (62.596 kg)  09/10/12 140 lb (63.504 kg)  09/04/12 140 lb 12.8 oz (63.866 kg)     Past Medical History  Diagnosis Date  . History of colon cancer      s/p colon resection  . Aortic stenosis     a. s/p AVR with 23mm Edwards pericardial valve 02/07/12 - post-op course complicated by pleural effusion requring thoracentesis/leg cellulitis 03/2012.  Marland Kitchen CAD (coronary artery disease)     a. s/p NSTEMI 12/2011:  LHC - Ostial left main 20%, ostial LAD 50%, mid 60-70%, ostial D1 40% and mid 40%, D2 70%, ostial circumflex occluded, ostial RCA 80-90%, LVEDP was 42. b.  s/p CABG x 3 at time of AVR (LiMA-LAD, SVG-2nd daigonal, SVG-PDA) 02/07/12 (post-op course noted above).  . Ischemic cardiomyopathy   . Chronic systolic heart failure     a. TEE 01/2012: EF 25-30%, diffuse hypokinesis. b. Not on ACEI due to renal insufficiency.;  c. follow up  echo 08/06/12: EF 25%, mod diast dysfxn, AVR ok, mild MR, mod LAE, mild RAE, mild to mod RV systolic dysfunction  . HLD (hyperlipidemia)   . Stroke 10/01  . GERD (gastroesophageal reflux disease)   . Chronic kidney disease   . Pleural effusion     a. post-operatively after AVR/CABG s/p thoracentesis 03/2012 yielding 1L serosanguinous fluid.  . Diabetes mellitus     borderline  . Blood transfusion     NO REACTION TO TRANSFUSION  . Cellulitis     a. RLE cellulitis 2 months post-operatively after AVR/CABG - serratia marcessans, tx with I&D/antibiotics  .  Mitral regurgitation     Moderate by TEE 01/2012  . Flash pulmonary edema     Post-cath 12/2011, went into acute pulm edema requiring IV lasix and intubation  . Baker's cyst 07/23/12    Incidental finding of LE venous dopplers  . Cataract     right eye, hx of  . HTN (hypertension)     primary, Dr. Dorris Fetch    Current Outpatient Prescriptions  Medication Sig Dispense Refill  . aspirin EC 325 MG EC tablet Take 1 tablet (325 mg total) by mouth daily.  30 tablet    . carvedilol (COREG) 25 MG tablet Take 25 mg by mouth 2 (two) times daily with a meal.      . cholecalciferol (VITAMIN D) 1000 UNITS tablet Take 1,000 Units by mouth daily.       . darbepoetin  (ARANESP) 300 MCG/0.6ML SOLN Inject 300 mcg into the skin. Every three weeks      . furosemide (LASIX) 40 MG tablet Take 40 mg by mouth daily.      . hydrALAZINE (APRESOLINE) 25 MG tablet Take 1 tablet (25 mg total) by mouth 3 (three) times daily.  90 tablet  3  . isosorbide mononitrate (IMDUR) 30 MG 24 hr tablet Take 1 tablet (30 mg total) by mouth daily.  30 tablet  6  . Multiple Vitamin (MULTIVITAMIN) capsule Take 1 capsule by mouth daily.        . nitroGLYCERIN (NITROSTAT) 0.4 MG SL tablet Place 0.4 mg under the tongue every 5 (five) minutes x 3 doses as needed. For chest pain      . omeprazole (PRILOSEC) 20 MG capsule Take 20 mg by mouth daily.        Marland Kitchen oxyCODONE (OXY IR/ROXICODONE) 5 MG immediate release tablet Take 1 tablet (5 mg total) by mouth every 4 (four) hours as needed for pain.  20 tablet  0  . sodium chloride 0.9 % SOLN 60 mL with talc 5 G SUSR 3 g 3 g by Intrapleural route once.  1 Bottle  0  . Tamsulosin HCl (FLOMAX) 0.4 MG CAPS Take 0.4 mg by mouth daily after supper.      . Travoprost, BAK Free, (TRAVATAN) 0.004 % SOLN ophthalmic solution Place 1 drop into both eyes 2 (two) times daily.      Marland Kitchen DISCONTD: metFORMIN (GLUCOPHAGE) 500 MG tablet Take 500-1,000 mg by mouth. 2 tabs in the am, 1 tab in the pm      . DISCONTD: potassium chloride SA (K-DUR,KLOR-CON) 20 MEQ tablet Take 1 tablet (20 mEq total) by mouth daily.  14 tablet  3    Allergies: No Known Allergies  History  Substance Use Topics  . Smoking status: Never Smoker   . Smokeless tobacco: Never Used  . Alcohol Use: No     PHYSICAL EXAM: VS:  BP 112/46  Pulse 47  Ht 5\' 9"  (1.753 m)  Wt 138 lb (62.596 kg)  BMI 20.38 kg/m2 Well nourished, well developed, in no acute distress HEENT: normal Neck: no JVD Cardiac:  normal S1, S2; RRR; no murmur Lungs:  Decreased breath sounds on the left, no wheezing, no rhonchi  Abd: soft, nontender, no hepatomegaly Ext: trace LE edema R>L Skin: warm and dry Neuro:  CNs 2-12  intact, no focal abnormalities noted  EKG:  Sinus brady, HR 47, normal axis, LVH with repol abnormality, no significant change      ASSESSMENT AND PLAN:  1. Chronic Systolic CHF:  Volume stable.  His wife is very attentive to his weight and appropriately gives him extra lasix when his weight goes up.  He is on good dose of hydralazine, nitrates, coreg.  No ACE or ARB due to CKD.  Recent creatinine stable.  Follow up with Dr. Valera Castle in 6-8 weeks.  2. Ischemic Cardiomyopathy:  EF still < 35% 6 mos post CABG and AVR.  Given his advanced age, I suspect he is not a candidate for ICD.  Follow up with Dr. Valera Castle in 6-8 weeks.  3. Chronic Kidney Disease:  Creatinine stable.  4. Anemia:  Managed by hematology.  5. Pleural Effusion:  Still has PleurX cath in place.  2nd talc pleurodesis planned this week with Dr. Laneta Simmers.  6. Hypertension:  Controlled.  Continue current therapy.   7. Hyperlipidemia:  LE weakness much better.  He is not eager to try another statin at this time.  I think this is reasonable.  He is to start PT soon.  If he feels up to trying something like pravastatin in the future, we can discuss that later.   Signed, Tereso Newcomer, PA-C  1:04 PM 09/10/2012

## 2012-09-10 NOTE — Progress Notes (Signed)
[address]            [name] [address]          [phone]      HPI:  The patient returned today for followup of recurrent left pleural effusion. The left Pleurx catheter only draind 150 cc on September 28 but then 450 cc on the 29th and 400 cc on the 30th. Today it drained 250 cc of serous fluid. His weight is stable and he has no significant edema in his legs. He denies any fever or chills. He denies shortness of breath.  Current Outpatient Prescriptions  Medication Sig Dispense Refill  . aspirin EC 325 MG EC tablet Take 1 tablet (325 mg total) by mouth daily.  30 tablet    . carvedilol (COREG) 25 MG tablet Take 25 mg by mouth 2 (two) times daily with a meal.      . cholecalciferol (VITAMIN D) 1000 UNITS tablet Take 1,000 Units by mouth daily.       . darbepoetin (ARANESP) 300 MCG/0.6ML SOLN Inject 300 mcg into the skin. Every three weeks      . furosemide (LASIX) 40 MG tablet Take 40 mg by mouth daily.      . hydrALAZINE (APRESOLINE) 25 MG tablet Take 1 tablet (25 mg total) by mouth 3 (three) times daily.  90 tablet  3  . isosorbide mononitrate (IMDUR) 30 MG 24 hr tablet Take 1 tablet (30 mg total) by mouth daily.  30 tablet  6  . Multiple Vitamin (MULTIVITAMIN) capsule Take 1 capsule by mouth daily.        . nitroGLYCERIN (NITROSTAT) 0.4 MG SL tablet Place 0.4 mg under the tongue every 5 (five) minutes x 3 doses as needed. For chest pain      . omeprazole (PRILOSEC) 20 MG capsule Take 20 mg by mouth daily.        Marland Kitchen oxyCODONE (OXY IR/ROXICODONE) 5 MG immediate release tablet Take 1 tablet (5 mg total) by mouth every 4 (four) hours as needed for pain.  20 tablet  0  . sodium chloride 0.9 % SOLN 60 mL with talc 5 G SUSR 3 g 3 g by Intrapleural route once.  1 Bottle  0  . Tamsulosin HCl (FLOMAX) 0.4 MG CAPS Take 0.4 mg by mouth daily after supper.      . Travoprost, BAK Free, (TRAVATAN) 0.004 % SOLN ophthalmic solution Place 1 drop into both eyes 2  (two) times daily.      Marland Kitchen DISCONTD: metFORMIN (GLUCOPHAGE) 500 MG tablet Take 500-1,000 mg by mouth. 2 tabs in the am, 1 tab in the pm      . DISCONTD: potassium chloride SA (K-DUR,KLOR-CON) 20 MEQ tablet Take 1 tablet (20 mEq total) by mouth daily.  14 tablet  3     Physical Exam: BP 127/60  Pulse 53  Resp 16  Ht 5\' 9"  (1.753 m)  Wt 140 lb (63.504 kg)  BMI 20.67 kg/m2  SpO2 98% He looks well. Lung exam reveals slight decreased breath sounds at left base. The Pleurx catheter site looks fine. There is trace peripheral edema in the lower legs. .   Diagnostic Tests: None   Impression/Plan:  He still has significant output from the Pleurx catheter after talc pleurodesis through the catheter on September 20. I think it is time to try a second dose of  talc. If this does not result in cessation of the pleural effusion then he will likely require left thoracoscopy for pleurodesis or pleurectomy. I discussed these plans with the patient and he agrees to proceed. He will return on Thursday, October 3 for talc pleurodesos in short stay.

## 2012-09-12 ENCOUNTER — Other Ambulatory Visit: Payer: Self-pay

## 2012-09-12 ENCOUNTER — Encounter (HOSPITAL_COMMUNITY)
Admission: RE | Disposition: A | Discharge status: Home / Self Care | Payer: Self-pay | Source: Ambulatory Visit | Attending: Surgery

## 2012-09-12 ENCOUNTER — Ambulatory Visit (HOSPITAL_COMMUNITY)
Admission: RE | Admit: 2012-09-12 | Discharge: 2012-09-12 | Disposition: A | Discharge status: Home / Self Care | Payer: Medicare Other | Source: Ambulatory Visit | Attending: Surgery | Admitting: Surgery

## 2012-09-12 DIAGNOSIS — J90 Pleural effusion, not elsewhere classified: Secondary | ICD-10-CM

## 2012-09-12 HISTORY — PX: TALC PLEURODESIS: SHX2506

## 2012-09-12 SURGERY — PLEURODESIS, USING TALC
Anesthesia: LOCAL | Site: Chest | Laterality: Left | Wound class: Clean Contaminated

## 2012-09-12 MED ORDER — TALC 5 G PL SUSR: 3.0000 g | Freq: Once | INTRAVENOUS | Status: DC

## 2012-09-12 MED ORDER — TALC 5 G PL SUSR
3.0000 g | Freq: Once | INTRAVENOUS | Status: DC
  Filled 2012-09-12: qty 3

## 2012-09-12 NOTE — CV Procedure (Signed)
Talc pleurodesis performed without problems. 350 cc of serous effusion removed. Then 3 grams of sterile talc in suspension infused. Plan to see back in office in 2 weeks.

## 2012-09-12 NOTE — Progress Notes (Signed)
Pt. Arrival time to [facility]- 1210, Start time on procedure - 1332, finished - 1344, pt. D/c'd ambulatory at 1355

## 2012-09-12 NOTE — Progress Notes (Signed)
Call to D. Talley,RN for order for talc.  Call to Pharmacy, spoke with Glenis Smoker, [place], requesting order to be filled.

## 2012-09-12 NOTE — H&P (Signed)
[address]            [name] [address]          [phone]     HPI:  He returns today for followup of a left Pleurx catheter placed on 07/23/2012 for recurrent left pleural effusion. The catheter drained 900-950 cc per day from August 24 through August 28 and since has started to decrease. It drained 750 cc on August 29, 825 cc on August 30, and 650 cc on August 31. Then on September 1 it only drained 100 cc but his wife suspects that the nurse did not drain it correctly. The following day on September 2 it drained 1020 cc. On Sept. 3 it drained 550 cc of yellow, serous fluid. It continued to drain 500-600 cc per day so we instilled 3 grams of talc about 2 weeks ago to try to establish a pleurodesis. He tolerated this well but had a fever that night. He has continued to drain 300-350 cc per day since. The patient said that he has been feeling well otherwise and has had no shortness of breath. His lower extremity edema has continued to decrease.  Current Outpatient Prescriptions   Medication  Sig  Dispense  Refill   .  aspirin EC 325 MG EC tablet  Take 1 tablet (325 mg total) by mouth daily.  30 tablet    .  carvedilol (COREG) 25 MG tablet  TAKE 1 TABLET BY MOUTH TWICE DAILY WITH A MEAL  60 tablet  5   .  cholecalciferol (VITAMIN D) 1000 UNITS tablet  Take 1,000 Units by mouth daily.     .  furosemide (LASIX) 40 MG tablet  TAKE 1 TABLET BY MOUTH ONCE DAILY  30 tablet  6   .  hydrALAZINE (APRESOLINE) 25 MG tablet  Take 1 tablet (25 mg total) by mouth 3 (three) times daily.  90 tablet  3   .  isosorbide mononitrate (IMDUR) 30 MG 24 hr tablet  Take 1 tablet (30 mg total) by mouth daily.  30 tablet  6   .  Multiple Vitamin (MULTIVITAMIN) capsule  Take 1 capsule by mouth daily.     .  nitroGLYCERIN (NITROSTAT) 0.4 MG SL tablet  Place 0.4 mg under the tongue every 5 (five) minutes x 3 doses as needed. For chest pain     .  omeprazole (PRILOSEC) 20 MG capsule  Take  20 mg by mouth daily.     .  Tamsulosin HCl (FLOMAX) 0.4 MG CAPS  TAKE 1 CAPSULE BY MOUTH ONCE DAILY AFTER SUPPER  30 capsule  1   .  Travoprost, BAK Free, (TRAVATAN) 0.004 % SOLN ophthalmic solution  Place 1 drop into both eyes 2 (two) times daily.     Marland Kitchen  DISCONTD: metFORMIN (GLUCOPHAGE) 500 MG tablet  Take 500-1,000 mg by mouth. 2 tabs in the am, 1 tab in the pm     .  DISCONTD: potassium chloride SA (K-DUR,KLOR-CON) 20 MEQ tablet  Take 1 tablet (20 mEq total) by mouth daily.  14 tablet  3   Physical Exam:  BP 152/58  Pulse 52  Resp 16  Ht 5\' 9"  (1.753 m)  Wt 134 lb 3.2 oz (60.873 kg)  BMI 19.82 kg/m2  SpO2 98%  He looks well.  Lung exam reveals decreased breath sounds at left base.  The catheter  site has no signs of infection.  Diagnostic Tests:  *RADIOLOGY REPORT*  Clinical Data: CABG 2 weeks ago, follow-up  CHEST - 2 VIEW  Comparison: Chest x-ray of 08/13/2012  Findings: There is little change in pleural and parenchymal opacity  at the left lung base consistent with effusion and atelectasis. A  left pleurex catheter remains. Cardiomegaly is stable and there  does appear to be mild pulmonary vascular congestion present.  Median sternotomy sutures are noted from CABG and aortic valve  replacement. There are degenerative changes throughout the  thoracic spine.  IMPRESSION:  1. No significant change in the left pleural effusion with left  pleurex catheter remaining. Mild left basilar atelectasis remains.  2. No change in cardiomegaly. Suspect mild pulmonary vascular  congestion.  Original Report Authenticated By: Juline Patch, M.D.  Impression:   He continues to have 300-350 cc per day of serous drainage from the left Pleurx catheter. It has been since talc was inserted. He appears to be euvolemic with no significant peripheral edema and I think his congestive heart failure is well-managed. I think it is best to proceed with a second talc pleurodesis through the Pleurx catheter.  I discussed the procedure with the patient and his wife including alternatives, benefits, and risks including but not limited to fever, chest pain, systemic inflammatory reaction, failure to resolve the pleural fluid drainage, and catheter malfunction. They understand and would like to proceed with talc pleurodesis.  Plan:  I will schedule talc pleurodesis in short stay on Thursday, 09/12/2012.

## 2012-09-12 NOTE — Interval H&P Note (Signed)
History and Physical Interval Note:  09/12/2012 1:53 PM  [name] [name]  has presented today for surgery, with the diagnosis of PLEURAL EFFUSION  The various methods of treatment have been discussed with the patient and family. After consideration of risks, benefits and other options for treatment, the patient has consented to  Procedure(s) (LRB) with comments: TALC PLEURADESIS (Left) as a surgical intervention .  The patient's history has been reviewed, patient examined, no change in status, stable for surgery.  I have reviewed the patient's chart and labs.  Questions were answered to the patient's satisfaction.     [name]

## 2012-09-13 ENCOUNTER — Encounter (HOSPITAL_COMMUNITY): Payer: Self-pay

## 2012-09-13 ENCOUNTER — Encounter (HOSPITAL_COMMUNITY): Payer: Self-pay | Admitting: Surgery

## 2012-09-13 NOTE — Op Note (Signed)
[name] [name], [name] [name]             ACCOUNT NO.:  [id number]  MEDICAL RECORD NO.:  [id number]  LOCATION:  [facility]                         FACILITY:  [facility]  PHYSICIAN:  Evelene Croon, M.D.     DATE OF BIRTH:  12/30/1925  DATE OF PROCEDURE:  09/12/2012 DATE OF DISCHARGE:  09/12/2012                              OPERATIVE REPORT   PREOPERATIVE DIAGNOSIS:  Recurrent left pleural effusion.  POSTOPERATIVE DIAGNOSIS:  Recurrent left pleural effusion.  OPERATIVE PROCEDURE:  Talc pleurodesis through PleurX catheter.  ATTENDING SURGEON:  Evelene Croon, M.D.  ANESTHESIA:  None.  CLINICAL HISTORY:  This patient is an 78 year old gentleman who is status post aortic valve replacement and coronary bypass surgery.  He has had recurrent problems with left pleural effusion and had a left PleurX catheter placed a couple months ago.  He had persistent drainage from the catheter that would not stop and therefore about 2 weeks ago, we performed talc pleurodesis using 3 g of sterile talc suspension.  He has continued to have 300-350 mL of drainage daily from the catheter and therefore I felt we should try a 2nd dose of talc.  I discussed the procedure with the patient and his wife including alternatives, benefits, and risks including, but not limited to, infection, reaction to the talc with fever or respiratory difficulty, and the possibility that this may not resolve the pleural effusion and may require further intervention.  They understood all this and agreed to proceed.  PROCEDURE:  The patient was brought to the short stay area.  Consent was obtained.  The left PleurX catheter was drained sterilely and 350 mL of yellow serous fluid was removed.  Then, 3 g of sterile talc was infused through the PleurX catheter.  The patient tolerated this well without any symptoms.  The catheter was capped.  The exit site was cleaned with Betadine solution, and a dry sterile dressing was applied around the catheter  followed by a waterproof covering.  The patient tolerated the procedure well and was sent home with his wife.     Evelene Croon, M.D.     BB/MEDQ  D:  09/12/2012  T:  09/13/2012  Job:  [id number]

## 2012-09-16 DIAGNOSIS — E119 Type 2 diabetes mellitus without complications: Secondary | ICD-10-CM | POA: Diagnosis not present

## 2012-09-16 DIAGNOSIS — J90 Pleural effusion, not elsewhere classified: Secondary | ICD-10-CM | POA: Diagnosis not present

## 2012-09-16 DIAGNOSIS — I5023 Acute on chronic systolic (congestive) heart failure: Secondary | ICD-10-CM | POA: Diagnosis not present

## 2012-09-16 DIAGNOSIS — I2589 Other forms of chronic ischemic heart disease: Secondary | ICD-10-CM | POA: Diagnosis not present

## 2012-09-16 DIAGNOSIS — I10 Essential (primary) hypertension: Secondary | ICD-10-CM | POA: Diagnosis not present

## 2012-09-16 DIAGNOSIS — Z4801 Encounter for change or removal of surgical wound dressing: Secondary | ICD-10-CM | POA: Diagnosis not present

## 2012-09-17 DIAGNOSIS — Z951 Presence of aortocoronary bypass graft: Secondary | ICD-10-CM

## 2012-09-20 DIAGNOSIS — J90 Pleural effusion, not elsewhere classified: Secondary | ICD-10-CM | POA: Diagnosis not present

## 2012-09-20 DIAGNOSIS — I10 Essential (primary) hypertension: Secondary | ICD-10-CM | POA: Diagnosis not present

## 2012-09-20 DIAGNOSIS — E119 Type 2 diabetes mellitus without complications: Secondary | ICD-10-CM | POA: Diagnosis not present

## 2012-09-20 DIAGNOSIS — I2589 Other forms of chronic ischemic heart disease: Secondary | ICD-10-CM | POA: Diagnosis not present

## 2012-09-20 DIAGNOSIS — I5023 Acute on chronic systolic (congestive) heart failure: Secondary | ICD-10-CM | POA: Diagnosis not present

## 2012-09-20 DIAGNOSIS — Z4801 Encounter for change or removal of surgical wound dressing: Secondary | ICD-10-CM | POA: Diagnosis not present

## 2012-09-23 ENCOUNTER — Other Ambulatory Visit: Payer: Self-pay | Admitting: Surgery

## 2012-09-23 DIAGNOSIS — J90 Pleural effusion, not elsewhere classified: Secondary | ICD-10-CM

## 2012-09-24 ENCOUNTER — Other Ambulatory Visit: Payer: Self-pay | Admitting: *Deleted

## 2012-09-24 ENCOUNTER — Ambulatory Visit (INDEPENDENT_AMBULATORY_CARE_PROVIDER_SITE_OTHER): Payer: Self-pay | Admitting: Surgery

## 2012-09-24 ENCOUNTER — Encounter: Payer: Self-pay | Admitting: Surgery

## 2012-09-24 ENCOUNTER — Ambulatory Visit
Admission: RE | Admit: 2012-09-24 | Discharge: 2012-09-24 | Disposition: A | Discharge status: Home / Self Care | Payer: Medicare Other | Source: Ambulatory Visit | Attending: Surgery | Admitting: Surgery

## 2012-09-24 VITALS — BP 131/71 | HR 53 | Resp 16 | Ht 69.0 in | Wt 138.2 lb

## 2012-09-24 DIAGNOSIS — J90 Pleural effusion, not elsewhere classified: Secondary | ICD-10-CM

## 2012-09-24 DIAGNOSIS — Z09 Encounter for follow-up examination after completed treatment for conditions other than malignant neoplasm: Secondary | ICD-10-CM | POA: Diagnosis not present

## 2012-09-24 NOTE — Progress Notes (Signed)
                   [address]            [name] [address]          [phone]       HPI:  The patient returns today for followup of his left pleural effusion. He had a second dose of talc instilled on October 3. The catheter drainage has decreased nicely since then. Over the past week he has had minimal drainage from the catheter. He said that his breathing has been good. Denies any cough or sputum production.  Current Outpatient Prescriptions  Medication Sig Dispense Refill  . aspirin EC 325 MG EC tablet Take 1 tablet (325 mg total) by mouth daily.  30 tablet    . carvedilol (COREG) 25 MG tablet Take 25 mg by mouth 2 (two) times daily with a meal.      . cholecalciferol (VITAMIN D) 1000 UNITS tablet Take 1,000 Units by mouth daily.       . furosemide (LASIX) 40 MG tablet Take 40 mg by mouth 2 (two) times daily.      . hydrALAZINE (APRESOLINE) 25 MG tablet Take 1 tablet (25 mg total) by mouth 3 (three) times daily.  90 tablet  3  . isosorbide mononitrate (IMDUR) 30 MG 24 hr tablet Take 1 tablet (30 mg total) by mouth daily.  30 tablet  6  . Multiple Vitamin (MULTIVITAMIN) capsule Take 1 capsule by mouth daily.       . nitroGLYCERIN (NITROSTAT) 0.4 MG SL tablet Place 0.4 mg under the tongue every 5 (five) minutes x 3 doses as needed. For chest pain      . omeprazole (PRILOSEC) 20 MG capsule Take 20 mg by mouth daily.       Marland Kitchen oxyCODONE (OXY IR/ROXICODONE) 5 MG immediate release tablet Take 5 mg by mouth every 4 (four) hours as needed. For pain      . Tamsulosin HCl (FLOMAX) 0.4 MG CAPS Take 0.4 mg by mouth daily after supper.      . Travoprost, BAK Free, (TRAVATAN) 0.004 % SOLN ophthalmic solution Place 1 drop into both eyes 2 (two) times daily.      Marland Kitchen DISCONTD: metFORMIN (GLUCOPHAGE) 500 MG tablet Take 500-1,000 mg by mouth. 2 tabs in the am, 1 tab in the pm      . DISCONTD: potassium chloride SA (K-DUR,KLOR-CON) 20 MEQ tablet Take 1 tablet (20 mEq total) by mouth  daily.  14 tablet  3     Physical Exam: BP 131/71  Pulse 53  Resp 16  Ht 5\' 9"  (1.753 m)  Wt 138 lb 3.2 oz (62.687 kg)  BMI 20.41 kg/m2  SpO2 97% He looks well. Lung exam reveals slight decreased breath sounds at the left base. There is no drainage from the Pleurx catheter today.  Diagnostic Tests:  Chest x-ray today shows minimal residual left pleural effusion or pleural thickening.  Impression:  The  Pleurx catheter is not draining any significant fluid in the past week. Hopefully this means that we have had a good pleurodesis and resolution of his pleural effusion.  Plan:  He will return to Short Stay for Pleurx catheter removal. Then I will plan to see him back in about 1 month with a chest x-ray.

## 2012-09-25 ENCOUNTER — Other Ambulatory Visit (HOSPITAL_BASED_OUTPATIENT_CLINIC_OR_DEPARTMENT_OTHER): Payer: Medicare Other | Admitting: Lab

## 2012-09-25 ENCOUNTER — Ambulatory Visit (HOSPITAL_BASED_OUTPATIENT_CLINIC_OR_DEPARTMENT_OTHER): Payer: Medicare Other

## 2012-09-25 ENCOUNTER — Encounter (HOSPITAL_COMMUNITY): Payer: Self-pay | Admitting: Pharmacy Technician

## 2012-09-25 VITALS — BP 139/58 | HR 50 | Temp 97.0°F

## 2012-09-25 DIAGNOSIS — E876 Hypokalemia: Secondary | ICD-10-CM

## 2012-09-25 DIAGNOSIS — I5023 Acute on chronic systolic (congestive) heart failure: Secondary | ICD-10-CM

## 2012-09-25 DIAGNOSIS — D649 Anemia, unspecified: Secondary | ICD-10-CM

## 2012-09-25 DIAGNOSIS — E785 Hyperlipidemia, unspecified: Secondary | ICD-10-CM

## 2012-09-25 DIAGNOSIS — I10 Essential (primary) hypertension: Secondary | ICD-10-CM

## 2012-09-25 DIAGNOSIS — L03119 Cellulitis of unspecified part of limb: Secondary | ICD-10-CM

## 2012-09-25 DIAGNOSIS — N189 Chronic kidney disease, unspecified: Secondary | ICD-10-CM

## 2012-09-25 DIAGNOSIS — I2510 Atherosclerotic heart disease of native coronary artery without angina pectoris: Secondary | ICD-10-CM

## 2012-09-25 DIAGNOSIS — I5022 Chronic systolic (congestive) heart failure: Secondary | ICD-10-CM

## 2012-09-25 DIAGNOSIS — J90 Pleural effusion, not elsewhere classified: Secondary | ICD-10-CM

## 2012-09-25 DIAGNOSIS — N179 Acute kidney failure, unspecified: Secondary | ICD-10-CM

## 2012-09-25 DIAGNOSIS — I214 Non-ST elevation (NSTEMI) myocardial infarction: Secondary | ICD-10-CM

## 2012-09-25 DIAGNOSIS — L02419 Cutaneous abscess of limb, unspecified: Secondary | ICD-10-CM

## 2012-09-25 DIAGNOSIS — I255 Ischemic cardiomyopathy: Secondary | ICD-10-CM

## 2012-09-25 LAB — CBC WITH DIFFERENTIAL/PLATELET
BASO%: 0.4 % (ref 0.0–2.0)
Basophils Absolute: 0 10*3/uL (ref 0.0–0.1)
Eosinophils Absolute: 0.1 10*3/uL (ref 0.0–0.5)
HCT: 28.8 % — ABNORMAL LOW (ref 38.4–49.9)
HGB: 9.1 g/dL — ABNORMAL LOW (ref 13.0–17.1)
LYMPH%: 25 % (ref 14.0–49.0)
MONO#: 0.5 10*3/uL (ref 0.1–0.9)
NEUT%: 64.3 % (ref 39.0–75.0)
Platelets: 170 10*3/uL (ref 140–400)
WBC: 5.2 10*3/uL (ref 4.0–10.3)
lymph#: 1.3 10*3/uL (ref 0.9–3.3)

## 2012-09-25 MED ORDER — DARBEPOETIN ALFA-POLYSORBATE 300 MCG/0.6ML IJ SOLN
300.0000 ug | Freq: Once | INTRAMUSCULAR | Status: AC
  Administered 2012-09-25: 300 ug via SUBCUTANEOUS
  Filled 2012-09-25: qty 0.6

## 2012-09-26 ENCOUNTER — Encounter (HOSPITAL_COMMUNITY): Payer: Self-pay | Admitting: *Deleted

## 2012-09-27 ENCOUNTER — Ambulatory Visit (HOSPITAL_COMMUNITY)
Admission: RE | Admit: 2012-09-27 | Discharge: 2012-09-27 | Disposition: A | Discharge status: Home / Self Care | Payer: Medicare Other | Source: Ambulatory Visit | Attending: Surgery | Admitting: Surgery

## 2012-09-27 ENCOUNTER — Encounter (HOSPITAL_COMMUNITY)
Admission: RE | Disposition: A | Discharge status: Home / Self Care | Payer: Self-pay | Source: Ambulatory Visit | Attending: Surgery

## 2012-09-27 DIAGNOSIS — J90 Pleural effusion, not elsewhere classified: Secondary | ICD-10-CM

## 2012-09-27 DIAGNOSIS — Z954 Presence of other heart-valve replacement: Secondary | ICD-10-CM | POA: Diagnosis not present

## 2012-09-27 DIAGNOSIS — I2510 Atherosclerotic heart disease of native coronary artery without angina pectoris: Secondary | ICD-10-CM | POA: Insufficient documentation

## 2012-09-27 HISTORY — PX: REMOVAL OF PLEURAL DRAINAGE CATHETER: SHX5080

## 2012-09-27 HISTORY — DX: Anemia, unspecified: D64.9

## 2012-09-27 SURGERY — REMOVAL, CLOSED DRAINAGE CATHETER SYSTEM, PLEURAL
Anesthesia: LOCAL | Laterality: Left | Wound class: Clean Contaminated

## 2012-09-27 MED ORDER — LIDOCAINE HCL (PF) 1 % IJ SOLN
INTRAMUSCULAR | Status: AC
  Filled 2012-09-27: qty 30

## 2012-09-27 NOTE — Progress Notes (Addendum)
Pt. In [facility]  at 1110, procedure start time 1253, end time 1304. Pt. discharged with wife at 1320, ambulatory.

## 2012-09-27 NOTE — CV Procedure (Signed)
                   [address]            [name] [address]          [phone]    Brief procedure note:   After Betadine prep Left pleurx catheter removed intact using standard technique.  Anest 8cc 1% lidocaine local    Complications: none  Patient tolerated procedure well  # 1 3-0 nylon placed

## 2012-09-30 ENCOUNTER — Encounter (HOSPITAL_COMMUNITY): Payer: Self-pay

## 2012-09-30 NOTE — H&P (Signed)
HPI:  He returns today for followup of a left Pleurx catheter placed on 07/23/2012 for recurrent left pleural effusion. The catheter drained 900-950 cc per day from August 24 through August 28 and since has started to decrease. It drained 750 cc on August 29, 825 cc on August 30, and 650 cc on August 31. Then on September 1 it only drained 100 cc but his wife suspects that the nurse did not drain it correctly. The following day on September 2 it drained 1020 cc. On Sept. 3 it drained 550 cc of yellow, serous fluid. It continued to drain 500-600 cc per day so we instilled 3 grams of talc about 2 weeks ago to try to establish a pleurodesis. He tolerated this well but had a fever that night. He has continued to drain 300-350 cc per day since. The patient said that he has been feeling well otherwise and has had no shortness of breath. His lower extremity edema has continued to decrease.  Current Outpatient Prescriptions   Medication  Sig  Dispense  Refill   .  aspirin EC 325 MG EC tablet  Take 1 tablet (325 mg total) by mouth daily.  30 tablet    .  carvedilol (COREG) 25 MG tablet  TAKE 1 TABLET BY MOUTH TWICE DAILY WITH A MEAL  60 tablet  5   .  cholecalciferol (VITAMIN D) 1000 UNITS tablet  Take 1,000 Units by mouth daily.     .  furosemide (LASIX) 40 MG tablet  TAKE 1 TABLET BY MOUTH ONCE DAILY  30 tablet  6   .  hydrALAZINE (APRESOLINE) 25 MG tablet  Take 1 tablet (25 mg total) by mouth 3 (three) times daily.  90 tablet  3   .  isosorbide mononitrate (IMDUR) 30 MG 24 hr tablet  Take 1 tablet (30 mg total) by mouth daily.  30 tablet  6   .  Multiple Vitamin (MULTIVITAMIN) capsule  Take 1 capsule by mouth daily.     .  nitroGLYCERIN (NITROSTAT) 0.4 MG SL tablet  Place 0.4 mg under the tongue every 5 (five) minutes x 3 doses as needed. For chest pain     .  omeprazole (PRILOSEC) 20 MG capsule  Take 20 mg by mouth daily.     .  Tamsulosin HCl (FLOMAX) 0.4 MG CAPS  TAKE 1 CAPSULE BY MOUTH ONCE DAILY AFTER  SUPPER  30 capsule  1   .  Travoprost, BAK Free, (TRAVATAN) 0.004 % SOLN ophthalmic solution  Place 1 drop into both eyes 2 (two) times daily.     Marland Kitchen  DISCONTD: metFORMIN (GLUCOPHAGE) 500 MG tablet  Take 500-1,000 mg by mouth. 2 tabs in the am, 1 tab in the pm     .  DISCONTD: potassium chloride SA (K-DUR,KLOR-CON) 20 MEQ tablet  Take 1 tablet (20 mEq total) by mouth daily.  14 tablet  3   Physical Exam:  BP 152/58  Pulse 52  Resp 16  Ht 5\' 9"  (1.753 m)  Wt 134 lb 3.2 oz (60.873 kg)  BMI 19.82 kg/m2  SpO2 98%  He looks well.  Lung exam reveals decreased breath sounds at left base.  The catheter site has no signs of infection.  Diagnostic Tests:  *RADIOLOGY REPORT*  Clinical Data: CABG 2 weeks ago, follow-up  CHEST - 2 VIEW  Comparison: Chest x-ray of 08/13/2012  Findings: There is little change in pleural and parenchymal opacity  at the left lung base consistent with  effusion and atelectasis. A  left pleurex catheter remains. Cardiomegaly is stable and there  does appear to be mild pulmonary vascular congestion present.  Median sternotomy sutures are noted from CABG and aortic valve  replacement. There are degenerative changes throughout the  thoracic spine.  IMPRESSION:  1. No significant change in the left pleural effusion with left  pleurex catheter remaining. Mild left basilar atelectasis remains.  2. No change in cardiomegaly. Suspect mild pulmonary vascular  congestion.  Original Report Authenticated By: Juline Patch, M.D.  Impression:  He continues to have 300-350 cc per day of serous drainage from the left Pleurx catheter. It has been since talc was inserted. He appears to be euvolemic with no significant peripheral edema and I think his congestive heart failure is well-managed. I think it is best to proceed with a second talc pleurodesis through the Pleurx catheter. I discussed the procedure with the patient and his wife including alternatives, benefits, and risks including  but not limited to fever, chest pain, systemic inflammatory reaction, failure to resolve the pleural fluid drainage, and catheter malfunction. They understand and would like to proceed with talc pleurodesis.  Plan:  I will schedule talc pleurodesis in short stay on Thursday, 09/12/2012.

## 2012-10-01 ENCOUNTER — Encounter (HOSPITAL_COMMUNITY): Payer: Self-pay | Admitting: Surgery

## 2012-10-01 DIAGNOSIS — G8190 Hemiplegia, unspecified affecting unspecified side: Secondary | ICD-10-CM | POA: Diagnosis not present

## 2012-10-01 DIAGNOSIS — R209 Unspecified disturbances of skin sensation: Secondary | ICD-10-CM | POA: Diagnosis not present

## 2012-10-02 ENCOUNTER — Encounter (HOSPITAL_COMMUNITY): Payer: Self-pay

## 2012-10-04 ENCOUNTER — Ambulatory Visit (INDEPENDENT_AMBULATORY_CARE_PROVIDER_SITE_OTHER): Payer: Medicare Other | Admitting: *Deleted

## 2012-10-04 DIAGNOSIS — J90 Pleural effusion, not elsewhere classified: Secondary | ICD-10-CM

## 2012-10-04 DIAGNOSIS — Z09 Encounter for follow-up examination after completed treatment for conditions other than malignant neoplasm: Secondary | ICD-10-CM

## 2012-10-04 DIAGNOSIS — Z4802 Encounter for removal of sutures: Secondary | ICD-10-CM

## 2012-10-04 NOTE — Progress Notes (Signed)
[name] [name] returns for suture removal s/p removal of his left pleurX catheter.  The area is healed with very minimal redness of the site.  Lung are CTA.  He will return as scheduled with a cxr . He is very happy to be "catheter free" after such a long course of therapy!

## 2012-10-08 ENCOUNTER — Encounter: Payer: Self-pay | Admitting: Cardiology

## 2012-10-09 DIAGNOSIS — M6250 Muscle wasting and atrophy, not elsewhere classified, unspecified site: Secondary | ICD-10-CM | POA: Diagnosis not present

## 2012-10-09 DIAGNOSIS — D638 Anemia in other chronic diseases classified elsewhere: Secondary | ICD-10-CM | POA: Diagnosis not present

## 2012-10-09 DIAGNOSIS — G8190 Hemiplegia, unspecified affecting unspecified side: Secondary | ICD-10-CM | POA: Diagnosis not present

## 2012-10-15 ENCOUNTER — Other Ambulatory Visit: Payer: Self-pay | Admitting: Cardiology

## 2012-10-16 ENCOUNTER — Encounter: Payer: Self-pay | Admitting: Oncology

## 2012-10-16 ENCOUNTER — Other Ambulatory Visit (HOSPITAL_BASED_OUTPATIENT_CLINIC_OR_DEPARTMENT_OTHER): Payer: Medicare Other | Admitting: Lab

## 2012-10-16 ENCOUNTER — Ambulatory Visit (HOSPITAL_BASED_OUTPATIENT_CLINIC_OR_DEPARTMENT_OTHER): Payer: Medicare Other | Admitting: Oncology

## 2012-10-16 VITALS — BP 131/67 | HR 58 | Temp 97.1°F | Resp 20 | Ht 69.0 in | Wt 154.1 lb

## 2012-10-16 DIAGNOSIS — I5023 Acute on chronic systolic (congestive) heart failure: Secondary | ICD-10-CM

## 2012-10-16 DIAGNOSIS — D696 Thrombocytopenia, unspecified: Secondary | ICD-10-CM

## 2012-10-16 DIAGNOSIS — N179 Acute kidney failure, unspecified: Secondary | ICD-10-CM

## 2012-10-16 DIAGNOSIS — I10 Essential (primary) hypertension: Secondary | ICD-10-CM

## 2012-10-16 DIAGNOSIS — L02419 Cutaneous abscess of limb, unspecified: Secondary | ICD-10-CM

## 2012-10-16 DIAGNOSIS — I2510 Atherosclerotic heart disease of native coronary artery without angina pectoris: Secondary | ICD-10-CM

## 2012-10-16 DIAGNOSIS — E876 Hypokalemia: Secondary | ICD-10-CM

## 2012-10-16 DIAGNOSIS — D649 Anemia, unspecified: Secondary | ICD-10-CM

## 2012-10-16 DIAGNOSIS — L03119 Cellulitis of unspecified part of limb: Secondary | ICD-10-CM

## 2012-10-16 DIAGNOSIS — I214 Non-ST elevation (NSTEMI) myocardial infarction: Secondary | ICD-10-CM

## 2012-10-16 DIAGNOSIS — J90 Pleural effusion, not elsewhere classified: Secondary | ICD-10-CM

## 2012-10-16 DIAGNOSIS — E785 Hyperlipidemia, unspecified: Secondary | ICD-10-CM

## 2012-10-16 DIAGNOSIS — N189 Chronic kidney disease, unspecified: Secondary | ICD-10-CM

## 2012-10-16 DIAGNOSIS — I5022 Chronic systolic (congestive) heart failure: Secondary | ICD-10-CM

## 2012-10-16 DIAGNOSIS — I255 Ischemic cardiomyopathy: Secondary | ICD-10-CM

## 2012-10-16 LAB — CBC WITH DIFFERENTIAL/PLATELET
Basophils Absolute: 0 10*3/uL (ref 0.0–0.1)
Eosinophils Absolute: 0 10*3/uL (ref 0.0–0.5)
HGB: 8.8 g/dL — ABNORMAL LOW (ref 13.0–17.1)
MCV: 82.5 fL (ref 79.3–98.0)
MONO%: 8.5 % (ref 0.0–14.0)
NEUT#: 3.3 10*3/uL (ref 1.5–6.5)
RDW: 17.2 % — ABNORMAL HIGH (ref 11.0–14.6)

## 2012-10-16 MED ORDER — DARBEPOETIN ALFA-POLYSORBATE 300 MCG/0.6ML IJ SOLN
300.0000 ug | Freq: Once | INTRAMUSCULAR | Status: AC
  Administered 2012-10-16: 300 ug via SUBCUTANEOUS
  Filled 2012-10-16: qty 0.6

## 2012-10-16 NOTE — Progress Notes (Signed)
Hematology and Oncology Follow Up Visit  [name] [name] [id number] 16-Sep-1926 78 y.o. 10/16/2012 2:34 PM [name], [name], DOReed, [name] L, DO   Principle Diagnosis: anemia that is multifactorial likely due to chronic disease and renal insufficiency  Current therapy: Aranesp 300 mcg every 3 weeks for a hemoglobin less than 10  Interim History:  [name] [name] returns for routine followup with his wife. He has received 2 Aranesp injection since his last visit with [place]. He has not really noticed much improvement in terms of his fatigue. He had his pleurex catheter removed since we last saw him. He underwent a talc pleurodesis due to a recurrent pleural effusion. He is scheduled for a followup chest x-ray and visit next week. He denies chest pain, shortness of breath, dyspnea. He has not noticed any bleeding. Appetite is improving and he is slowly gaining weight. He denies any edema.  Medications: I have reviewed the patient's current medications. Current outpatient prescriptions:aspirin EC 325 MG tablet, Take 325 mg by mouth daily., Disp: , Rfl: ;  carvedilol (COREG) 25 MG tablet, Take 25 mg by mouth 2 (two) times daily with a meal., Disp: , Rfl: ;  cholecalciferol (VITAMIN D) 1000 UNITS tablet, Take 1,000 Units by mouth daily. , Disp: , Rfl: ;  furosemide (LASIX) 40 MG tablet, Take 40 mg by mouth daily. CAN TAKE AN EXTRA TABLET IF WEIGHT GAIN., Disp: , Rfl:  hydrALAZINE (APRESOLINE) 25 MG tablet, Take 25 mg by mouth 3 (three) times daily., Disp: , Rfl: ;  isosorbide mononitrate (IMDUR) 30 MG 24 hr tablet, Take 30 mg by mouth daily., Disp: , Rfl: ;  Multiple Vitamin (MULTIVITAMIN) capsule, Take 1 capsule by mouth daily. , Disp: , Rfl: ;  nitroGLYCERIN (NITROSTAT) 0.4 MG SL tablet, Place 0.4 mg under the tongue every 5 (five) minutes x 3 doses as needed. For chest pain, Disp: , Rfl:  omeprazole (PRILOSEC) 20 MG capsule, Take 20 mg by mouth daily. , Disp: , Rfl: ;  Tamsulosin HCl (FLOMAX) 0.4 MG CAPS, Take  0.4 mg by mouth daily after supper., Disp: , Rfl: ;  Tamsulosin HCl (FLOMAX) 0.4 MG CAPS, TAKE 1 CAPSULE BY MOUTH ONCE DAILY AFTER SUPPER, Disp: 30 capsule, Rfl: 1;  Travoprost, BAK Free, (TRAVATAN) 0.004 % SOLN ophthalmic solution, Place 1 drop into both eyes 2 (two) times daily., Disp: , Rfl:  [DISCONTINUED] metFORMIN (GLUCOPHAGE) 500 MG tablet, Take 500-1,000 mg by mouth. 2 tabs in the am, 1 tab in the pm, Disp: , Rfl: ;  [DISCONTINUED] potassium chloride SA (K-DUR,KLOR-CON) 20 MEQ tablet, Take 1 tablet (20 mEq total) by mouth daily., Disp: 14 tablet, Rfl: 3 Current facility-administered medications:[COMPLETED] darbepoetin (ARANESP) injection 300 mcg, 300 mcg, Subcutaneous, Once, Benjiman Core, MD, 300 mcg at 10/16/12 1424  Allergies: No Known Allergies  Past Medical History, Surgical history, Social history, and Family History were reviewed and updated.  Review of Systems: Constitutional:  Negative for fever, chills, night sweats, anorexia, weight loss, pain. Cardiovascular: no chest pain or dyspnea on exertion Respiratory: no cough, shortness of breath, or wheezing Neurological: no TIA or stroke symptoms Dermatological: negative ENT: negative Skin: Negative. Gastrointestinal: no abdominal pain, change in bowel habits, or black or bloody stools Genito-Urinary: no dysuria, trouble voiding, or hematuria Hematological and Lymphatic: negative Breast: negative for breast lumps Musculoskeletal: negative Remaining ROS negative. Physical Exam: Blood pressure 131/67, pulse 58, temperature 97.1 F (36.2 C), temperature source Oral, resp. rate 20, height 5\' 9"  (1.753 m), weight 154 lb 1.6 oz (69.899  kg). ECOG:  General appearance: alert, cooperative and no distress Head: Normocephalic, without obvious abnormality, atraumatic Neck: no adenopathy, no carotid bruit, no JVD, supple, symmetrical, trachea midline and thyroid not enlarged, symmetric, no tenderness/mass/nodules Lymph nodes: Cervical,  supraclavicular, and axillary nodes normal. Heart:regular rate and rhythm, S1, S2 normal, no murmur, click, rub or gallop Lung:chest clear, no wheezing, rales, normal symmetric air entry, no tachypnea, retractions or cyanosis Abdomen: soft, non-tender, without masses or organomegaly EXT:no erythema, induration, or nodules   Lab Results: Lab Results  Component Value Date   WBC 4.9 10/16/2012   HGB 8.8* 10/16/2012   HCT 27.4* 10/16/2012   MCV 82.5 10/16/2012   PLT 139* 10/16/2012     Chemistry      Component Value Date/Time   NA 140 09/04/2012 1309   NA 137 08/29/2012 1443   K 4.7 09/04/2012 1309   K 5.4* 08/29/2012 1443   CL 101 09/04/2012 1309   CL 101 08/29/2012 1443   CO2 31* 09/04/2012 1309   CO2 32 08/29/2012 1443   BUN 41.0* 09/04/2012 1309   BUN 39* 08/29/2012 1443   CREATININE 2.2* 09/04/2012 1309   CREATININE 2.03* 08/29/2012 1443      Component Value Date/Time   CALCIUM 9.3 09/04/2012 1309   CALCIUM 9.5 08/29/2012 1443   ALKPHOS 73 09/04/2012 1309   ALKPHOS 67 08/29/2012 1443   AST 18 09/04/2012 1309   AST 20 08/29/2012 1443   ALT 21 09/04/2012 1309   ALT 19 08/29/2012 1443   BILITOT 0.40 09/04/2012 1309   BILITOT 0.3 08/29/2012 1443     Impression and Plan: This is an 78 year old gentleman with the following issues: 1. Anemia, multifactorial. Likely due to chronic disease and renal insufficiency. Hemoglobin is 8.8 today and he will proceed with Aranesp injection. Counts reviewed with Dr Clelia Croft. Plan is to continue the Aranesp injections for at least 3 more months. If no improvement will consider a bone marrow biopsy. 2. Thrombocytopenia, mild. We will continue to watch this with future lab draws. He has no active bleeding. 3. Hypertension. Blood pressure is controlled with hydralazine, Coreg, and Lasix. 4. Coronary artery disease. He remains on aspirin, Coreg, and Imdur. 5. Followup. The patient will continue to have a lab and possible Aranesp injection every 3 weeks. Visit in  approximately 12 weeks.  Spent more than half the time coordinating care.    [name] [name] 11/6/20132:34 PM

## 2012-10-18 ENCOUNTER — Other Ambulatory Visit: Payer: Self-pay | Admitting: Surgery

## 2012-10-18 DIAGNOSIS — J90 Pleural effusion, not elsewhere classified: Secondary | ICD-10-CM

## 2012-10-22 ENCOUNTER — Ambulatory Visit (INDEPENDENT_AMBULATORY_CARE_PROVIDER_SITE_OTHER): Payer: Medicare Other | Admitting: Surgery

## 2012-10-22 ENCOUNTER — Encounter: Payer: Self-pay | Admitting: Surgery

## 2012-10-22 ENCOUNTER — Ambulatory Visit
Admission: RE | Admit: 2012-10-22 | Discharge: 2012-10-22 | Disposition: A | Discharge status: Home / Self Care | Payer: Medicare Other | Source: Ambulatory Visit | Attending: Surgery | Admitting: Surgery

## 2012-10-22 VITALS — BP 133/58 | HR 55 | Resp 18 | Ht 69.0 in | Wt 154.0 lb

## 2012-10-22 DIAGNOSIS — J90 Pleural effusion, not elsewhere classified: Secondary | ICD-10-CM

## 2012-10-22 DIAGNOSIS — Z09 Encounter for follow-up examination after completed treatment for conditions other than malignant neoplasm: Secondary | ICD-10-CM | POA: Diagnosis not present

## 2012-10-22 NOTE — Progress Notes (Signed)
[address]            [name] [address]          [phone]      HPI:  The patient returns today for followup of his recurrent left pleural effusion having under gone talc pleurodesis x2. He said that he is feeling well and denies any chest pain or shortness of breath. He has had no cough or sputum production. His peripheral edema has essentially resolved.  Current Outpatient Prescriptions  Medication Sig Dispense Refill  . aspirin EC 325 MG tablet Take 325 mg by mouth daily.      . carvedilol (COREG) 25 MG tablet Take 25 mg by mouth 2 (two) times daily with a meal.      . cholecalciferol (VITAMIN D) 1000 UNITS tablet Take 1,000 Units by mouth daily.       Marland Kitchen epoetin alfa (EPOGEN,PROCRIT) 2000 UNIT/ML injection Inject 2,000 Units into the skin once. Once every 3 weeks      . furosemide (LASIX) 40 MG tablet Take 40 mg by mouth daily. CAN TAKE AN EXTRA TABLET IF WEIGHT GAIN.      . hydrALAZINE (APRESOLINE) 25 MG tablet Take 25 mg by mouth 3 (three) times daily.      . isosorbide mononitrate (IMDUR) 30 MG 24 hr tablet Take 30 mg by mouth daily.      . Multiple Vitamin (MULTIVITAMIN) capsule Take 1 capsule by mouth daily.       . nitroGLYCERIN (NITROSTAT) 0.4 MG SL tablet Place 0.4 mg under the tongue every 5 (five) minutes x 3 doses as needed. For chest pain      . omeprazole (PRILOSEC) 20 MG capsule Take 20 mg by mouth daily.       . Prenatal Vit-Fe Fumarate-FA (MULTIVITAMIN-PRENATAL) 27-0.8 MG TABS Take 1 tablet by mouth daily.      . Tamsulosin HCl (FLOMAX) 0.4 MG CAPS TAKE 1 CAPSULE BY MOUTH ONCE DAILY AFTER SUPPER  30 capsule  1  . Travoprost, BAK Free, (TRAVATAN) 0.004 % SOLN ophthalmic solution Place 1 drop into both eyes 2 (two) times daily.      . [DISCONTINUED] metFORMIN (GLUCOPHAGE) 500 MG tablet Take 500-1,000 mg by mouth. 2 tabs in the am, 1 tab in the pm      . [DISCONTINUED] potassium chloride SA (K-DUR,KLOR-CON) 20 MEQ tablet Take 1 tablet  (20 mEq total) by mouth daily.  14 tablet  3     Physical Exam: BP 133/58  Pulse 55  Resp 18  Ht 5\' 9"  (1.753 m)  Wt 154 lb (69.854 kg)  BMI 22.74 kg/m2  SpO2 98% He looks well. Cardiac exam shows a regular rate and rhythm with normal heart sounds. Lung exam reveals a few crackles at the left base. There is no peripheral edema.  Diagnostic Tests:  *RADIOLOGY REPORT*   Clinical Data: Follow-up pleural effusion.   CHEST - 2 VIEW   Comparison: Two-view chest 09/24/2012.   Findings: Progressive interstitial and airspace disease is present at the left lung base.  The heart size is normal.  The lung volumes are low.  Bilateral pleural effusions are evident, worse on the left.  The patient is status post median sternotomy for aortic valve replacement.  The visualized soft tissues and bony thorax are unremarkable.   IMPRESSION:   1.  Progressive interstitial and airspace disease involving the  left lower lobe.  This raises concern for post obstructive disease with a central obstructing lesion.  Consider CT with contrast for further evaluation. 2.  Persistent bilateral pleural effusions, left greater than right.  The left sided effusion has increased.     Original Report Authenticated By: Marin Roberts, M.D.     Impression:  Overall I think he is doing well. The radiologist felt there was progressive interstitial and airspace disease in the left lower lobe but this is the reaction to the talc. This is a pleural reaction and is responsible for the pleurodesis. I don't think there is any significant residual effusion on the left side. There is a tiny right pleural effusion. The lungs are otherwise clear.  Plan:  I will plan to see him back in 3 months and we'll do a chest x-ray at that time.

## 2012-10-24 ENCOUNTER — Ambulatory Visit: Payer: Medicare Other | Admitting: Cardiology

## 2012-10-28 ENCOUNTER — Ambulatory Visit: Payer: Medicare Other | Attending: Neurology | Admitting: *Deleted

## 2012-10-28 DIAGNOSIS — IMO0001 Reserved for inherently not codable concepts without codable children: Secondary | ICD-10-CM | POA: Diagnosis not present

## 2012-10-28 DIAGNOSIS — M6281 Muscle weakness (generalized): Secondary | ICD-10-CM | POA: Diagnosis not present

## 2012-10-28 DIAGNOSIS — R269 Unspecified abnormalities of gait and mobility: Secondary | ICD-10-CM | POA: Diagnosis not present

## 2012-10-31 ENCOUNTER — Ambulatory Visit: Payer: Medicare Other | Admitting: Physical Therapy

## 2012-11-04 ENCOUNTER — Ambulatory Visit: Payer: Medicare Other | Admitting: *Deleted

## 2012-11-06 ENCOUNTER — Other Ambulatory Visit: Payer: Medicare Other | Admitting: Lab

## 2012-11-06 ENCOUNTER — Other Ambulatory Visit (HOSPITAL_BASED_OUTPATIENT_CLINIC_OR_DEPARTMENT_OTHER): Payer: Medicare Other | Admitting: Lab

## 2012-11-06 ENCOUNTER — Ambulatory Visit (HOSPITAL_BASED_OUTPATIENT_CLINIC_OR_DEPARTMENT_OTHER): Payer: Medicare Other

## 2012-11-06 VITALS — BP 132/64 | HR 56 | Temp 97.0°F

## 2012-11-06 DIAGNOSIS — E876 Hypokalemia: Secondary | ICD-10-CM

## 2012-11-06 DIAGNOSIS — J90 Pleural effusion, not elsewhere classified: Secondary | ICD-10-CM

## 2012-11-06 DIAGNOSIS — N189 Chronic kidney disease, unspecified: Secondary | ICD-10-CM

## 2012-11-06 DIAGNOSIS — L03119 Cellulitis of unspecified part of limb: Secondary | ICD-10-CM

## 2012-11-06 DIAGNOSIS — I5022 Chronic systolic (congestive) heart failure: Secondary | ICD-10-CM

## 2012-11-06 DIAGNOSIS — D649 Anemia, unspecified: Secondary | ICD-10-CM

## 2012-11-06 DIAGNOSIS — I5023 Acute on chronic systolic (congestive) heart failure: Secondary | ICD-10-CM

## 2012-11-06 DIAGNOSIS — I255 Ischemic cardiomyopathy: Secondary | ICD-10-CM

## 2012-11-06 DIAGNOSIS — E785 Hyperlipidemia, unspecified: Secondary | ICD-10-CM

## 2012-11-06 DIAGNOSIS — I214 Non-ST elevation (NSTEMI) myocardial infarction: Secondary | ICD-10-CM

## 2012-11-06 DIAGNOSIS — I2510 Atherosclerotic heart disease of native coronary artery without angina pectoris: Secondary | ICD-10-CM | POA: Diagnosis not present

## 2012-11-06 DIAGNOSIS — I10 Essential (primary) hypertension: Secondary | ICD-10-CM

## 2012-11-06 DIAGNOSIS — L02419 Cutaneous abscess of limb, unspecified: Secondary | ICD-10-CM

## 2012-11-06 DIAGNOSIS — N179 Acute kidney failure, unspecified: Secondary | ICD-10-CM

## 2012-11-06 LAB — CBC WITH DIFFERENTIAL/PLATELET
BASO%: 0.2 % (ref 0.0–2.0)
Basophils Absolute: 0 10e3/uL (ref 0.0–0.1)
EOS%: 1.7 % (ref 0.0–7.0)
Eosinophils Absolute: 0.1 10e3/uL (ref 0.0–0.5)
HCT: 29.6 % — ABNORMAL LOW (ref 38.4–49.9)
HGB: 9.2 g/dL — ABNORMAL LOW (ref 13.0–17.1)
LYMPH%: 27.4 % (ref 14.0–49.0)
MCH: 25.8 pg — ABNORMAL LOW (ref 27.2–33.4)
MCHC: 30.9 g/dL — ABNORMAL LOW (ref 32.0–36.0)
MCV: 83.4 fL (ref 79.3–98.0)
MONO#: 0.5 10e3/uL (ref 0.1–0.9)
MONO%: 12.1 % (ref 0.0–14.0)
NEUT#: 2.6 10e3/uL (ref 1.5–6.5)
NEUT%: 58.6 % (ref 39.0–75.0)
Platelets: 136 10e3/uL — ABNORMAL LOW (ref 140–400)
RBC: 3.55 10e6/uL — ABNORMAL LOW (ref 4.20–5.82)
RDW: 17.8 % — ABNORMAL HIGH (ref 11.0–14.6)
WBC: 4.4 10e3/uL (ref 4.0–10.3)
lymph#: 1.2 10e3/uL (ref 0.9–3.3)

## 2012-11-06 MED ORDER — DARBEPOETIN ALFA-POLYSORBATE 300 MCG/0.6ML IJ SOLN
300.0000 ug | Freq: Once | INTRAMUSCULAR | Status: AC
  Administered 2012-11-06: 300 ug via SUBCUTANEOUS
  Filled 2012-11-06: qty 0.6

## 2012-11-11 ENCOUNTER — Ambulatory Visit: Payer: Medicare Other | Attending: Neurology | Admitting: Physical Therapy

## 2012-11-11 DIAGNOSIS — M6281 Muscle weakness (generalized): Secondary | ICD-10-CM | POA: Diagnosis not present

## 2012-11-11 DIAGNOSIS — R269 Unspecified abnormalities of gait and mobility: Secondary | ICD-10-CM | POA: Insufficient documentation

## 2012-11-11 DIAGNOSIS — IMO0001 Reserved for inherently not codable concepts without codable children: Secondary | ICD-10-CM | POA: Diagnosis not present

## 2012-11-14 ENCOUNTER — Ambulatory Visit: Payer: Medicare Other | Admitting: Physical Therapy

## 2012-11-14 DIAGNOSIS — IMO0001 Reserved for inherently not codable concepts without codable children: Secondary | ICD-10-CM | POA: Diagnosis not present

## 2012-11-14 DIAGNOSIS — M6281 Muscle weakness (generalized): Secondary | ICD-10-CM | POA: Diagnosis not present

## 2012-11-14 DIAGNOSIS — R269 Unspecified abnormalities of gait and mobility: Secondary | ICD-10-CM | POA: Diagnosis not present

## 2012-11-19 ENCOUNTER — Ambulatory Visit: Payer: Medicare Other | Admitting: Physical Therapy

## 2012-11-19 DIAGNOSIS — IMO0001 Reserved for inherently not codable concepts without codable children: Secondary | ICD-10-CM | POA: Diagnosis not present

## 2012-11-19 DIAGNOSIS — R269 Unspecified abnormalities of gait and mobility: Secondary | ICD-10-CM | POA: Diagnosis not present

## 2012-11-19 DIAGNOSIS — M6281 Muscle weakness (generalized): Secondary | ICD-10-CM | POA: Diagnosis not present

## 2012-11-20 ENCOUNTER — Other Ambulatory Visit: Payer: Self-pay | Admitting: Oncology

## 2012-11-22 ENCOUNTER — Ambulatory Visit: Payer: Medicare Other | Admitting: *Deleted

## 2012-11-22 DIAGNOSIS — IMO0001 Reserved for inherently not codable concepts without codable children: Secondary | ICD-10-CM | POA: Diagnosis not present

## 2012-11-22 DIAGNOSIS — M6281 Muscle weakness (generalized): Secondary | ICD-10-CM | POA: Diagnosis not present

## 2012-11-22 DIAGNOSIS — R269 Unspecified abnormalities of gait and mobility: Secondary | ICD-10-CM | POA: Diagnosis not present

## 2012-11-26 ENCOUNTER — Ambulatory Visit: Payer: Medicare Other | Admitting: *Deleted

## 2012-11-26 DIAGNOSIS — M6281 Muscle weakness (generalized): Secondary | ICD-10-CM | POA: Diagnosis not present

## 2012-11-26 DIAGNOSIS — IMO0001 Reserved for inherently not codable concepts without codable children: Secondary | ICD-10-CM | POA: Diagnosis not present

## 2012-11-26 DIAGNOSIS — R269 Unspecified abnormalities of gait and mobility: Secondary | ICD-10-CM | POA: Diagnosis not present

## 2012-11-27 ENCOUNTER — Ambulatory Visit (HOSPITAL_BASED_OUTPATIENT_CLINIC_OR_DEPARTMENT_OTHER): Payer: Medicare Other

## 2012-11-27 ENCOUNTER — Other Ambulatory Visit (HOSPITAL_BASED_OUTPATIENT_CLINIC_OR_DEPARTMENT_OTHER): Payer: Medicare Other | Admitting: Lab

## 2012-11-27 VITALS — BP 151/76 | HR 56 | Temp 96.6°F

## 2012-11-27 DIAGNOSIS — I2510 Atherosclerotic heart disease of native coronary artery without angina pectoris: Secondary | ICD-10-CM

## 2012-11-27 DIAGNOSIS — I5022 Chronic systolic (congestive) heart failure: Secondary | ICD-10-CM

## 2012-11-27 DIAGNOSIS — I255 Ischemic cardiomyopathy: Secondary | ICD-10-CM

## 2012-11-27 DIAGNOSIS — I5023 Acute on chronic systolic (congestive) heart failure: Secondary | ICD-10-CM

## 2012-11-27 DIAGNOSIS — D649 Anemia, unspecified: Secondary | ICD-10-CM

## 2012-11-27 DIAGNOSIS — L03119 Cellulitis of unspecified part of limb: Secondary | ICD-10-CM

## 2012-11-27 DIAGNOSIS — I10 Essential (primary) hypertension: Secondary | ICD-10-CM

## 2012-11-27 DIAGNOSIS — I214 Non-ST elevation (NSTEMI) myocardial infarction: Secondary | ICD-10-CM

## 2012-11-27 DIAGNOSIS — L02419 Cutaneous abscess of limb, unspecified: Secondary | ICD-10-CM

## 2012-11-27 DIAGNOSIS — E876 Hypokalemia: Secondary | ICD-10-CM

## 2012-11-27 DIAGNOSIS — J90 Pleural effusion, not elsewhere classified: Secondary | ICD-10-CM

## 2012-11-27 DIAGNOSIS — N189 Chronic kidney disease, unspecified: Secondary | ICD-10-CM

## 2012-11-27 DIAGNOSIS — E785 Hyperlipidemia, unspecified: Secondary | ICD-10-CM

## 2012-11-27 LAB — CBC WITH DIFFERENTIAL/PLATELET
BASO%: 0.3 % (ref 0.0–2.0)
Basophils Absolute: 0 10*3/uL (ref 0.0–0.1)
EOS%: 0.8 % (ref 0.0–7.0)
Eosinophils Absolute: 0 10*3/uL (ref 0.0–0.5)
HCT: 31.9 % — ABNORMAL LOW (ref 38.4–49.9)
HGB: 9.9 g/dL — ABNORMAL LOW (ref 13.0–17.1)
LYMPH%: 21.3 % (ref 14.0–49.0)
MCH: 25.9 pg — ABNORMAL LOW (ref 27.2–33.4)
MCHC: 31 g/dL — ABNORMAL LOW (ref 32.0–36.0)
MCV: 83.6 fL (ref 79.3–98.0)
MONO#: 0.4 10*3/uL (ref 0.1–0.9)
MONO%: 9.5 % (ref 0.0–14.0)
NEUT#: 2.8 10*3/uL (ref 1.5–6.5)
NEUT%: 68.1 % (ref 39.0–75.0)
Platelets: 132 10*3/uL — ABNORMAL LOW (ref 140–400)
RBC: 3.82 10*6/uL — ABNORMAL LOW (ref 4.20–5.82)
RDW: 18.9 % — ABNORMAL HIGH (ref 11.0–14.6)
WBC: 4.2 10*3/uL (ref 4.0–10.3)
lymph#: 0.9 10*3/uL (ref 0.9–3.3)

## 2012-11-27 MED ORDER — DARBEPOETIN ALFA-POLYSORBATE 300 MCG/0.6ML IJ SOLN
300.0000 ug | Freq: Once | INTRAMUSCULAR | Status: AC
  Administered 2012-11-27: 300 ug via SUBCUTANEOUS
  Filled 2012-11-27: qty 0.6

## 2012-11-27 NOTE — Patient Instructions (Signed)
Call MD for problems 

## 2012-11-29 ENCOUNTER — Ambulatory Visit: Payer: Medicare Other | Attending: Neurology | Admitting: *Deleted

## 2012-11-29 DIAGNOSIS — M6281 Muscle weakness (generalized): Secondary | ICD-10-CM | POA: Diagnosis not present

## 2012-11-29 DIAGNOSIS — R269 Unspecified abnormalities of gait and mobility: Secondary | ICD-10-CM | POA: Insufficient documentation

## 2012-11-29 DIAGNOSIS — IMO0001 Reserved for inherently not codable concepts without codable children: Secondary | ICD-10-CM | POA: Diagnosis not present

## 2012-12-02 DIAGNOSIS — E785 Hyperlipidemia, unspecified: Secondary | ICD-10-CM | POA: Diagnosis not present

## 2012-12-02 DIAGNOSIS — R972 Elevated prostate specific antigen [PSA]: Secondary | ICD-10-CM | POA: Diagnosis not present

## 2012-12-02 DIAGNOSIS — E1159 Type 2 diabetes mellitus with other circulatory complications: Secondary | ICD-10-CM | POA: Diagnosis not present

## 2012-12-06 ENCOUNTER — Telehealth: Payer: Self-pay | Admitting: Oncology

## 2012-12-06 NOTE — Telephone Encounter (Signed)
s.w pt and advised of time change on 1.29.14 appt....pt ok and aware

## 2012-12-09 ENCOUNTER — Other Ambulatory Visit: Payer: Self-pay | Admitting: Nurse Practitioner

## 2012-12-09 DIAGNOSIS — I2510 Atherosclerotic heart disease of native coronary artery without angina pectoris: Secondary | ICD-10-CM | POA: Diagnosis not present

## 2012-12-09 DIAGNOSIS — E1159 Type 2 diabetes mellitus with other circulatory complications: Secondary | ICD-10-CM | POA: Diagnosis not present

## 2012-12-09 DIAGNOSIS — N183 Chronic kidney disease, stage 3 unspecified: Secondary | ICD-10-CM | POA: Diagnosis not present

## 2012-12-09 DIAGNOSIS — I5043 Acute on chronic combined systolic (congestive) and diastolic (congestive) heart failure: Secondary | ICD-10-CM | POA: Diagnosis not present

## 2012-12-12 ENCOUNTER — Other Ambulatory Visit: Payer: Self-pay | Admitting: Nurse Practitioner

## 2012-12-18 ENCOUNTER — Other Ambulatory Visit (HOSPITAL_BASED_OUTPATIENT_CLINIC_OR_DEPARTMENT_OTHER): Payer: Medicare Other | Admitting: Lab

## 2012-12-18 ENCOUNTER — Ambulatory Visit: Payer: Medicare Other

## 2012-12-18 DIAGNOSIS — I5023 Acute on chronic systolic (congestive) heart failure: Secondary | ICD-10-CM

## 2012-12-18 DIAGNOSIS — N189 Chronic kidney disease, unspecified: Secondary | ICD-10-CM

## 2012-12-18 DIAGNOSIS — E785 Hyperlipidemia, unspecified: Secondary | ICD-10-CM

## 2012-12-18 DIAGNOSIS — D649 Anemia, unspecified: Secondary | ICD-10-CM

## 2012-12-18 DIAGNOSIS — I214 Non-ST elevation (NSTEMI) myocardial infarction: Secondary | ICD-10-CM

## 2012-12-18 DIAGNOSIS — I2510 Atherosclerotic heart disease of native coronary artery without angina pectoris: Secondary | ICD-10-CM

## 2012-12-18 DIAGNOSIS — I10 Essential (primary) hypertension: Secondary | ICD-10-CM

## 2012-12-18 DIAGNOSIS — E876 Hypokalemia: Secondary | ICD-10-CM

## 2012-12-18 DIAGNOSIS — N179 Acute kidney failure, unspecified: Secondary | ICD-10-CM

## 2012-12-18 DIAGNOSIS — L03119 Cellulitis of unspecified part of limb: Secondary | ICD-10-CM

## 2012-12-18 DIAGNOSIS — I255 Ischemic cardiomyopathy: Secondary | ICD-10-CM

## 2012-12-18 DIAGNOSIS — L02419 Cutaneous abscess of limb, unspecified: Secondary | ICD-10-CM

## 2012-12-18 DIAGNOSIS — J90 Pleural effusion, not elsewhere classified: Secondary | ICD-10-CM

## 2012-12-18 DIAGNOSIS — I5022 Chronic systolic (congestive) heart failure: Secondary | ICD-10-CM

## 2012-12-18 LAB — CBC WITH DIFFERENTIAL/PLATELET
BASO%: 0.4 % (ref 0.0–2.0)
EOS%: 1.1 % (ref 0.0–7.0)
HCT: 32 % — ABNORMAL LOW (ref 38.4–49.9)
LYMPH%: 21.8 % (ref 14.0–49.0)
MCH: 25.8 pg — ABNORMAL LOW (ref 27.2–33.4)
MCHC: 31.1 g/dL — ABNORMAL LOW (ref 32.0–36.0)
MONO%: 10 % (ref 0.0–14.0)
NEUT%: 66.7 % (ref 39.0–75.0)
Platelets: 129 10*3/uL — ABNORMAL LOW (ref 140–400)
RBC: 3.85 10*6/uL — ABNORMAL LOW (ref 4.20–5.82)
WBC: 4.7 10*3/uL (ref 4.0–10.3)

## 2012-12-18 NOTE — Progress Notes (Signed)
Injection not needed today, hgb 10.0, per last office note.  Ok per Clenton Pare, NP.  Pt verbalizes understanding and is aware of next appt.

## 2012-12-19 ENCOUNTER — Telehealth: Payer: Self-pay | Admitting: Cardiology

## 2012-12-19 ENCOUNTER — Telehealth: Payer: Self-pay | Admitting: *Deleted

## 2012-12-19 MED ORDER — TAMSULOSIN HCL 0.4 MG PO CAPS: 0.4000 mg | ORAL_CAPSULE | Freq: Every day | ORAL | Status: DC

## 2012-12-19 NOTE — Telephone Encounter (Signed)
New Problem:    Patient's wife called in needing a refill of his Tamsulosin HCl (FLOMAX) 0.4 MG CAPS. Patient is currently on his last pill.

## 2012-12-19 NOTE — Telephone Encounter (Signed)
Not sure if Dr Daleen Squibb refills this medication but Dr Daleen Squibb & Ms Eunice Blase have not been here so i refilled with #30 ,3 refills.  patient is out of medication    Kaleyah Labreck CMA

## 2012-12-23 ENCOUNTER — Ambulatory Visit: Payer: Medicare Other | Attending: Neurology | Admitting: Physical Therapy

## 2012-12-23 DIAGNOSIS — R269 Unspecified abnormalities of gait and mobility: Secondary | ICD-10-CM | POA: Diagnosis not present

## 2012-12-23 DIAGNOSIS — IMO0001 Reserved for inherently not codable concepts without codable children: Secondary | ICD-10-CM | POA: Insufficient documentation

## 2012-12-23 DIAGNOSIS — M6281 Muscle weakness (generalized): Secondary | ICD-10-CM | POA: Diagnosis not present

## 2012-12-27 ENCOUNTER — Ambulatory Visit: Payer: Medicare Other | Admitting: Physical Therapy

## 2012-12-30 ENCOUNTER — Ambulatory Visit: Payer: Medicare Other | Admitting: Physical Therapy

## 2013-01-01 ENCOUNTER — Ambulatory Visit: Payer: Medicare Other | Admitting: Cardiology

## 2013-01-03 ENCOUNTER — Ambulatory Visit: Payer: Medicare Other | Admitting: Physical Therapy

## 2013-01-07 ENCOUNTER — Ambulatory Visit: Payer: Medicare Other | Admitting: Physical Therapy

## 2013-01-08 ENCOUNTER — Ambulatory Visit: Payer: Medicare Other

## 2013-01-08 ENCOUNTER — Encounter: Payer: Self-pay | Admitting: Oncology

## 2013-01-08 ENCOUNTER — Ambulatory Visit (HOSPITAL_BASED_OUTPATIENT_CLINIC_OR_DEPARTMENT_OTHER): Payer: Medicare Other | Admitting: Oncology

## 2013-01-08 ENCOUNTER — Other Ambulatory Visit: Payer: Self-pay | Admitting: Cardiology

## 2013-01-08 ENCOUNTER — Telehealth: Payer: Self-pay | Admitting: Oncology

## 2013-01-08 ENCOUNTER — Other Ambulatory Visit: Payer: Medicare Other | Admitting: Lab

## 2013-01-08 ENCOUNTER — Encounter: Payer: Self-pay | Admitting: Cardiology

## 2013-01-08 ENCOUNTER — Ambulatory Visit: Payer: Medicare Other | Admitting: Oncology

## 2013-01-08 ENCOUNTER — Ambulatory Visit (INDEPENDENT_AMBULATORY_CARE_PROVIDER_SITE_OTHER): Payer: Medicare Other | Admitting: Cardiology

## 2013-01-08 ENCOUNTER — Other Ambulatory Visit (HOSPITAL_BASED_OUTPATIENT_CLINIC_OR_DEPARTMENT_OTHER): Payer: Medicare Other

## 2013-01-08 VITALS — BP 142/65 | HR 50 | Temp 97.0°F | Resp 18 | Ht 69.0 in | Wt 154.4 lb

## 2013-01-08 VITALS — BP 160/70 | HR 57 | Ht 69.0 in | Wt 159.0 lb

## 2013-01-08 DIAGNOSIS — N189 Chronic kidney disease, unspecified: Secondary | ICD-10-CM

## 2013-01-08 DIAGNOSIS — D696 Thrombocytopenia, unspecified: Secondary | ICD-10-CM

## 2013-01-08 DIAGNOSIS — I5022 Chronic systolic (congestive) heart failure: Secondary | ICD-10-CM | POA: Diagnosis not present

## 2013-01-08 DIAGNOSIS — I214 Non-ST elevation (NSTEMI) myocardial infarction: Secondary | ICD-10-CM

## 2013-01-08 DIAGNOSIS — I5023 Acute on chronic systolic (congestive) heart failure: Secondary | ICD-10-CM

## 2013-01-08 DIAGNOSIS — D649 Anemia, unspecified: Secondary | ICD-10-CM

## 2013-01-08 DIAGNOSIS — I10 Essential (primary) hypertension: Secondary | ICD-10-CM | POA: Diagnosis not present

## 2013-01-08 DIAGNOSIS — E785 Hyperlipidemia, unspecified: Secondary | ICD-10-CM

## 2013-01-08 DIAGNOSIS — L03119 Cellulitis of unspecified part of limb: Secondary | ICD-10-CM

## 2013-01-08 DIAGNOSIS — I255 Ischemic cardiomyopathy: Secondary | ICD-10-CM

## 2013-01-08 DIAGNOSIS — J90 Pleural effusion, not elsewhere classified: Secondary | ICD-10-CM

## 2013-01-08 DIAGNOSIS — N179 Acute kidney failure, unspecified: Secondary | ICD-10-CM

## 2013-01-08 DIAGNOSIS — I2510 Atherosclerotic heart disease of native coronary artery without angina pectoris: Secondary | ICD-10-CM

## 2013-01-08 DIAGNOSIS — E876 Hypokalemia: Secondary | ICD-10-CM

## 2013-01-08 LAB — CBC WITH DIFFERENTIAL/PLATELET
Basophils Absolute: 0 10*3/uL (ref 0.0–0.1)
Eosinophils Absolute: 0.1 10*3/uL (ref 0.0–0.5)
HCT: 34.4 % — ABNORMAL LOW (ref 38.4–49.9)
HGB: 10.3 g/dL — ABNORMAL LOW (ref 13.0–17.1)
LYMPH%: 23.4 % (ref 14.0–49.0)
MCV: 85.4 fL (ref 79.3–98.0)
MONO#: 0.3 10*3/uL (ref 0.1–0.9)
MONO%: 6.5 % (ref 0.0–14.0)
NEUT#: 3.2 10*3/uL (ref 1.5–6.5)
NEUT%: 68.6 % (ref 39.0–75.0)
Platelets: 110 10*3/uL — ABNORMAL LOW (ref 140–400)
WBC: 4.6 10*3/uL (ref 4.0–10.3)

## 2013-01-08 LAB — BASIC METABOLIC PANEL
BUN: 40 mg/dL — ABNORMAL HIGH (ref 6–23)
CO2: 33 mEq/L — ABNORMAL HIGH (ref 19–32)
Calcium: 9.2 mg/dL (ref 8.4–10.5)
Glucose, Bld: 154 mg/dL — ABNORMAL HIGH (ref 70–99)

## 2013-01-08 MED ORDER — DARBEPOETIN ALFA-POLYSORBATE 300 MCG/0.6ML IJ SOLN
300.0000 ug | Freq: Once | INTRAMUSCULAR | Status: DC
  Filled 2013-01-08: qty 0.6

## 2013-01-08 NOTE — Progress Notes (Signed)
HPI  [name] returns today for evaluation and management of his complex cardiovascular history. He is almost year out from aortic valve replacement for severe aortic stenosis and coronary bypass grafting. Postoperative course complicated by cellulitis and persistent pleural effusions.. He has chronic systolic heart failure which is now stable with the Lasix a day. He weighs himself every day. He is very compliant with his meds.  He is still seeing Dr Laneta Simmers on regular basis and has an appointment the middle of February.  He denies orthopnea, PND and his edema has been stable. He has a lot of weakness in the right lower stream 8 from postoperative cellulitis and immobilization and is currently in rehabilitation which is helping him gain strength.  He is on not on a statin but his last cholesterol was 123 with a very low LDL in January 2013. He cannot take them because of pain in his legs and weakness.  He has chronic kidney disease. His last creatinine was 2.2 back in the latter part of last year. This needs to be repeated   Past Medical History  Diagnosis Date  . History of colon cancer     s/p colon resection  . Aortic stenosis     a. s/p AVR with 23mm Edwards pericardial valve 02/07/12 - post-op course complicated by pleural effusion requring thoracentesis/leg cellulitis 03/2012.  Marland Kitchen CAD (coronary artery disease)     a. s/p NSTEMI 12/2011:  LHC - Ostial left main 20%, ostial LAD 50%, mid 60-70%, ostial D1 40% and mid 40%, D2 70%, ostial circumflex occluded, ostial RCA 80-90%, LVEDP was 42. b.  s/p CABG x 3 at time of AVR (LiMA-LAD, SVG-2nd daigonal, SVG-PDA) 02/07/12 (post-op course noted above).  . Ischemic cardiomyopathy   . Chronic systolic heart failure     a. TEE 01/2012: EF 25-30%, diffuse hypokinesis. b. Not on ACEI due to renal insufficiency.;  c. follow up  echo 08/06/12: EF 25%, mod diast dysfxn, AVR ok, mild MR, mod LAE, mild RAE, mild to mod RV systolic dysfunction  . HLD  (hyperlipidemia)   . GERD (gastroesophageal reflux disease)   . Chronic kidney disease   . Pleural effusion     a. post-operatively after AVR/CABG s/p thoracentesis 03/2012 yielding 1L serosanguinous fluid.  . Diabetes mellitus     borderline  . Blood transfusion     NO REACTION TO TRANSFUSION  . Cellulitis     a. RLE cellulitis 2 months post-operatively after AVR/CABG - serratia marcessans, tx with I&D/antibiotics  . Mitral regurgitation     Moderate by TEE 01/2012  . Flash pulmonary edema     Post-cath 12/2011, went into acute pulm edema requiring IV lasix and intubation  . Baker's cyst 07/23/12    Incidental finding of LE venous dopplers  . Cataract     right eye, hx of  . HTN (hypertension)     primary, Dr. Dorris Fetch  . Myocardial infarction 12/2011  . CHF (congestive heart failure)   . Stroke 10/01    RIght Leg weakness  . Cancer '90's    Colon  . Anemia     Current Outpatient Prescriptions  Medication Sig Dispense Refill  . aspirin EC 325 MG tablet Take 325 mg by mouth daily.      . carvedilol (COREG) 25 MG tablet Take 25 mg by mouth 2 (two) times daily with a meal.      . cholecalciferol (VITAMIN D) 1000 UNITS tablet Take 1,000 Units by mouth daily.       Marland Kitchen  epoetin alfa (EPOGEN,PROCRIT) 2000 UNIT/ML injection Inject 2,000 Units into the skin once. Once every 3 weeks      . furosemide (LASIX) 40 MG tablet Take 40 mg by mouth 2 (two) times daily. CAN TAKE AN EXTRA TABLET IF WEIGHT GAIN.      . hydrALAZINE (APRESOLINE) 25 MG tablet Take 25 mg by mouth 3 (three) times daily.      . isosorbide mononitrate (IMDUR) 30 MG 24 hr tablet Take 30 mg by mouth daily.      . Multiple Vitamin (MULTIVITAMIN) capsule Take 1 capsule by mouth daily.       . nitroGLYCERIN (NITROSTAT) 0.4 MG SL tablet Place 0.4 mg under the tongue every 5 (five) minutes x 3 doses as needed. For chest pain      . omeprazole (PRILOSEC) 20 MG capsule Take 20 mg by mouth daily.       . Prenatal Vit-Fe  Fumarate-FA (MULTIVITAMIN-PRENATAL) 27-0.8 MG TABS Take 1 tablet by mouth daily.      . Tamsulosin HCl (FLOMAX) 0.4 MG CAPS Take 1 capsule (0.4 mg total) by mouth daily after supper.  30 capsule  3  . Travoprost, BAK Free, (TRAVATAN) 0.004 % SOLN ophthalmic solution Place 1 drop into both eyes 2 (two) times daily.      . [DISCONTINUED] metFORMIN (GLUCOPHAGE) 500 MG tablet Take 500-1,000 mg by mouth. 2 tabs in the am, 1 tab in the pm      . [DISCONTINUED] potassium chloride SA (K-DUR,KLOR-CON) 20 MEQ tablet Take 1 tablet (20 mEq total) by mouth daily.  14 tablet  3    No Known Allergies  Family History  Problem Relation Age of Onset  . Cancer    . Heart attack    . Diabetes    . Heart failure    . Cancer Mother   . Heart attack Father     History   Social History  . Marital Status: Married    Spouse Name: N/A    Number of Children: N/A  . Years of Education: N/A   Occupational History  . Not on file.   Social History Main Topics  . Smoking status: Never Smoker   . Smokeless tobacco: Never Used  . Alcohol Use: No  . Drug Use: No  . Sexually Active: Not Currently   Other Topics Concern  . Not on file   Social History Narrative  . No narrative on file    ROS ALL NEGATIVE EXCEPT THOSE NOTED IN HPI  PE  General Appearance: well developed, well nourished in no acute distress, looks much stronger with good skin color since last visit. HEENT: symmetrical face, PERRLA, good dentition , glasses Neck: no JVD, thyromegaly, or adenopathy, trachea midline Chest: symmetric without deformity Cardiac: PMI non-displaced, RRR, normal S1, S2, no gallop, systolic murmur but no diastolic component. Lung: clear to ausculation and percussion Vascular: Bilateral referred sounds versus bruits in the carotids., Distal pulses reduced but present.  Abdominal: nondistended, nontender, good bowel sounds, no HSM, no bruits Extremities: no cyanosis, clubbing, 2+ pitting edema in the right 1+ in  the left, wearing support stockings. no sign of DVT, no varicosities  Skin: normal color, no rashes Neuro: alert and oriented x 3, non-focal Pysch: normal affect  EKG  BMET    Component Value Date/Time   NA 140 09/04/2012 1309   NA 137 08/29/2012 1443   K 4.7 09/04/2012 1309   K 5.4* 08/29/2012 1443   CL 101 09/04/2012 1309  CL 101 08/29/2012 1443   CO2 31* 09/04/2012 1309   CO2 32 08/29/2012 1443   GLUCOSE 148* 09/04/2012 1309   GLUCOSE 130* 08/29/2012 1443   BUN 41.0* 09/04/2012 1309   BUN 39* 08/29/2012 1443   CREATININE 2.2* 09/04/2012 1309   CREATININE 2.03* 08/29/2012 1443   CALCIUM 9.3 09/04/2012 1309   CALCIUM 9.5 08/29/2012 1443   GFRNONAA 28* 08/29/2012 1443   GFRAA 33* 08/29/2012 1443    Lipid Panel     Component Value Date/Time   CHOL 124 12/14/2011 0400   TRIG 69 12/14/2011 0400   HDL 47 12/14/2011 0400   CHOLHDL 2.6 12/14/2011 0400   VLDL 14 12/14/2011 0400   LDLCALC 63 12/14/2011 0400    CBC    Component Value Date/Time   WBC 4.7 12/18/2012 1251   WBC 5.1 08/29/2012 1443   RBC 3.85* 12/18/2012 1251   RBC 3.69* 08/29/2012 1443   HGB 10.0* 12/18/2012 1251   HGB 9.8* 08/29/2012 1443   HCT 32.0* 12/18/2012 1251   HCT 31.9* 08/29/2012 1443   PLT 129* 12/18/2012 1251   PLT 144* 08/29/2012 1443   MCV 83.0 12/18/2012 1251   MCV 86.4 08/29/2012 1443   MCH 25.8* 12/18/2012 1251   MCH 26.6 08/29/2012 1443   MCHC 31.1* 12/18/2012 1251   MCHC 30.7 08/29/2012 1443   RDW 19.0* 12/18/2012 1251   RDW 16.3* 08/29/2012 1443   LYMPHSABS 1.0 12/18/2012 1251   LYMPHSABS 1.5 08/14/2012 1215   MONOABS 0.5 12/18/2012 1251   MONOABS 0.3 08/14/2012 1215   EOSABS 0.0 12/18/2012 1251   EOSABS 0.1 08/14/2012 1215   BASOSABS 0.0 12/18/2012 1251   BASOSABS 0.0 08/14/2012 1215    HPI   Past Medical History  Diagnosis Date  . History of colon cancer     s/p colon resection  . Aortic stenosis     a. s/p AVR with 23mm Edwards pericardial valve 02/07/12 - post-op course complicated by pleural effusion requring thoracentesis/leg  cellulitis 03/2012.  Marland Kitchen CAD (coronary artery disease)     a. s/p NSTEMI 12/2011:  LHC - Ostial left main 20%, ostial LAD 50%, mid 60-70%, ostial D1 40% and mid 40%, D2 70%, ostial circumflex occluded, ostial RCA 80-90%, LVEDP was 42. b.  s/p CABG x 3 at time of AVR (LiMA-LAD, SVG-2nd daigonal, SVG-PDA) 02/07/12 (post-op course noted above).  . Ischemic cardiomyopathy   . Chronic systolic heart failure     a. TEE 01/2012: EF 25-30%, diffuse hypokinesis. b. Not on ACEI due to renal insufficiency.;  c. follow up  echo 08/06/12: EF 25%, mod diast dysfxn, AVR ok, mild MR, mod LAE, mild RAE, mild to mod RV systolic dysfunction  . HLD (hyperlipidemia)   . GERD (gastroesophageal reflux disease)   . Chronic kidney disease   . Pleural effusion     a. post-operatively after AVR/CABG s/p thoracentesis 03/2012 yielding 1L serosanguinous fluid.  . Diabetes mellitus     borderline  . Blood transfusion     NO REACTION TO TRANSFUSION  . Cellulitis     a. RLE cellulitis 2 months post-operatively after AVR/CABG - serratia marcessans, tx with I&D/antibiotics  . Mitral regurgitation     Moderate by TEE 01/2012  . Flash pulmonary edema     Post-cath 12/2011, went into acute pulm edema requiring IV lasix and intubation  . Baker's cyst 07/23/12    Incidental finding of LE venous dopplers  . Cataract     right eye, hx of  .  HTN (hypertension)     primary, Dr. Dorris Fetch  . Myocardial infarction 12/2011  . CHF (congestive heart failure)   . Stroke 10/01    RIght Leg weakness  . Cancer '90's    Colon  . Anemia     Current Outpatient Prescriptions  Medication Sig Dispense Refill  . aspirin EC 325 MG tablet Take 325 mg by mouth daily.      . carvedilol (COREG) 25 MG tablet Take 25 mg by mouth 2 (two) times daily with a meal.      . cholecalciferol (VITAMIN D) 1000 UNITS tablet Take 1,000 Units by mouth daily.       Marland Kitchen epoetin alfa (EPOGEN,PROCRIT) 2000 UNIT/ML injection Inject 2,000 Units into the skin once. Once  every 3 weeks      . furosemide (LASIX) 40 MG tablet Take 40 mg by mouth 2 (two) times daily. CAN TAKE AN EXTRA TABLET IF WEIGHT GAIN.      . hydrALAZINE (APRESOLINE) 25 MG tablet Take 25 mg by mouth 3 (three) times daily.      . isosorbide mononitrate (IMDUR) 30 MG 24 hr tablet Take 30 mg by mouth daily.      . Multiple Vitamin (MULTIVITAMIN) capsule Take 1 capsule by mouth daily.       . nitroGLYCERIN (NITROSTAT) 0.4 MG SL tablet Place 0.4 mg under the tongue every 5 (five) minutes x 3 doses as needed. For chest pain      . omeprazole (PRILOSEC) 20 MG capsule Take 20 mg by mouth daily.       . Prenatal Vit-Fe Fumarate-FA (MULTIVITAMIN-PRENATAL) 27-0.8 MG TABS Take 1 tablet by mouth daily.      . Tamsulosin HCl (FLOMAX) 0.4 MG CAPS Take 1 capsule (0.4 mg total) by mouth daily after supper.  30 capsule  3  . Travoprost, BAK Free, (TRAVATAN) 0.004 % SOLN ophthalmic solution Place 1 drop into both eyes 2 (two) times daily.      . [DISCONTINUED] metFORMIN (GLUCOPHAGE) 500 MG tablet Take 500-1,000 mg by mouth. 2 tabs in the am, 1 tab in the pm      . [DISCONTINUED] potassium chloride SA (K-DUR,KLOR-CON) 20 MEQ tablet Take 1 tablet (20 mEq total) by mouth daily.  14 tablet  3    No Known Allergies  Family History  Problem Relation Age of Onset  . Cancer    . Heart attack    . Diabetes    . Heart failure    . Cancer Mother   . Heart attack Father     History   Social History  . Marital Status: Married    Spouse Name: N/A    Number of Children: N/A  . Years of Education: N/A   Occupational History  . Not on file.   Social History Main Topics  . Smoking status: Never Smoker   . Smokeless tobacco: Never Used  . Alcohol Use: No  . Drug Use: No  . Sexually Active: Not Currently   Other Topics Concern  . Not on file   Social History Narrative  . No narrative on file    ROS ALL NEGATIVE EXCEPT THOSE NOTED IN HPI  PE  General Appearance: well developed, well nourished in no  acute distress HEENT: symmetrical face, PERRLA, good dentition  Neck: no JVD, thyromegaly, or adenopathy, trachea midline Chest: symmetric without deformity Cardiac: PMI non-displaced, RRR, normal S1, S2, no gallop or murmur Lung: clear to ausculation and percussion Vascular: all pulses full  without bruits  Abdominal: nondistended, nontender, good bowel sounds, no HSM, no bruits Extremities: no cyanosis, clubbing or edema, no sign of DVT, no varicosities  Skin: normal color, no rashes Neuro: alert and oriented x 3, non-focal Pysch: normal affect  EKG  BMET    Component Value Date/Time   NA 140 09/04/2012 1309   NA 137 08/29/2012 1443   K 4.7 09/04/2012 1309   K 5.4* 08/29/2012 1443   CL 101 09/04/2012 1309   CL 101 08/29/2012 1443   CO2 31* 09/04/2012 1309   CO2 32 08/29/2012 1443   GLUCOSE 148* 09/04/2012 1309   GLUCOSE 130* 08/29/2012 1443   BUN 41.0* 09/04/2012 1309   BUN 39* 08/29/2012 1443   CREATININE 2.2* 09/04/2012 1309   CREATININE 2.03* 08/29/2012 1443   CALCIUM 9.3 09/04/2012 1309   CALCIUM 9.5 08/29/2012 1443   GFRNONAA 28* 08/29/2012 1443   GFRAA 33* 08/29/2012 1443    Lipid Panel     Component Value Date/Time   CHOL 124 12/14/2011 0400   TRIG 69 12/14/2011 0400   HDL 47 12/14/2011 0400   CHOLHDL 2.6 12/14/2011 0400   VLDL 14 12/14/2011 0400   LDLCALC 63 12/14/2011 0400    CBC    Component Value Date/Time   WBC 4.7 12/18/2012 1251   WBC 5.1 08/29/2012 1443   RBC 3.85* 12/18/2012 1251   RBC 3.69* 08/29/2012 1443   HGB 10.0* 12/18/2012 1251   HGB 9.8* 08/29/2012 1443   HCT 32.0* 12/18/2012 1251   HCT 31.9* 08/29/2012 1443   PLT 129* 12/18/2012 1251   PLT 144* 08/29/2012 1443   MCV 83.0 12/18/2012 1251   MCV 86.4 08/29/2012 1443   MCH 25.8* 12/18/2012 1251   MCH 26.6 08/29/2012 1443   MCHC 31.1* 12/18/2012 1251   MCHC 30.7 08/29/2012 1443   RDW 19.0* 12/18/2012 1251   RDW 16.3* 08/29/2012 1443   LYMPHSABS 1.0 12/18/2012 1251   LYMPHSABS 1.5 08/14/2012 1215   MONOABS 0.5 12/18/2012 1251   MONOABS  0.3 08/14/2012 1215   EOSABS 0.0 12/18/2012 1251   EOSABS 0.1 08/14/2012 1215   BASOSABS 0.0 12/18/2012 1251   BASOSABS 0.0 08/14/2012 1215

## 2013-01-08 NOTE — Assessment & Plan Note (Signed)
Weight is stable on his current medical program. Will check metabolic profile looking specifically at renal function and his potassium. I've made no changes today. He'll continue daily weights, med compliance, and dietary compliance. I'll see him back again in 6 months.

## 2013-01-08 NOTE — Assessment & Plan Note (Signed)
Stable. Continue secondary preventative therapy. He is not a candidate secondary to intolerance for statin.

## 2013-01-08 NOTE — Patient Instructions (Addendum)
Your physician recommends that you have lab work today: BMP   Your physician wants you to follow-up in: 6 months with Dr. Daleen Squibb. You will receive a reminder letter in the mail two months in advance. If you don't receive a letter, please call our office to schedule the follow-up appointment.  Weigh daily. If you gain 3 pounds, become short of breath, or have increased swelling please call our office

## 2013-01-08 NOTE — Telephone Encounter (Signed)
appts made and printed for pt aom °

## 2013-01-08 NOTE — Progress Notes (Signed)
Hematology and Oncology Follow Up Visit  [name] [name] [id number] 1926/11/22 79 y.o. 01/08/2013 3:45 PM [name], [name], DOReed, [name] L, DO   Principle Diagnosis: anemia that is multifactorial likely due to chronic disease and renal insufficiency  Current therapy: Aranesp 300 mcg every 3 weeks for a hemoglobin less than 10  Interim History:  [name] [name] returns for routine followup with his wife. Receiving Aranesp and tolerating well. He has not really noticed much improvement in terms of his fatigue. Patient seen by cardiology today and states pleural effusion is better. He denies chest pain, shortness of breath, dyspnea. He has not noticed any bleeding. Appetite is improving and weight is stable. Lower extremity edema is stable. No bleeding.  Medications: I have reviewed the patient's current medications. Current outpatient prescriptions:aspirin EC 325 MG tablet, Take 325 mg by mouth daily., Disp: , Rfl: ;  carvedilol (COREG) 25 MG tablet, Take 25 mg by mouth 2 (two) times daily with a meal., Disp: , Rfl: ;  cholecalciferol (VITAMIN D) 1000 UNITS tablet, Take 1,000 Units by mouth daily. , Disp: , Rfl: ;  epoetin alfa (EPOGEN,PROCRIT) 2000 UNIT/ML injection, Inject 2,000 Units into the skin once. Once every 3 weeks, Disp: , Rfl:  furosemide (LASIX) 40 MG tablet, Take 40 mg by mouth 2 (two) times daily. CAN TAKE AN EXTRA TABLET IF WEIGHT GAIN., Disp: , Rfl: ;  hydrALAZINE (APRESOLINE) 25 MG tablet, Take 25 mg by mouth 3 (three) times daily., Disp: , Rfl: ;  isosorbide mononitrate (IMDUR) 30 MG 24 hr tablet, Take 30 mg by mouth daily., Disp: , Rfl: ;  Multiple Vitamin (MULTIVITAMIN) capsule, Take 1 capsule by mouth daily. , Disp: , Rfl:  nitroGLYCERIN (NITROSTAT) 0.4 MG SL tablet, Place 0.4 mg under the tongue every 5 (five) minutes x 3 doses as needed. For chest pain, Disp: , Rfl: ;  omeprazole (PRILOSEC) 20 MG capsule, Take 20 mg by mouth daily. , Disp: , Rfl: ;  Prenatal Vit-Fe Fumarate-FA  (MULTIVITAMIN-PRENATAL) 27-0.8 MG TABS, Take 1 tablet by mouth daily., Disp: , Rfl:  Tamsulosin HCl (FLOMAX) 0.4 MG CAPS, Take 1 capsule (0.4 mg total) by mouth daily after supper., Disp: 30 capsule, Rfl: 3;  Travoprost, BAK Free, (TRAVATAN) 0.004 % SOLN ophthalmic solution, Place 1 drop into both eyes 2 (two) times daily., Disp: , Rfl: ;  [DISCONTINUED] metFORMIN (GLUCOPHAGE) 500 MG tablet, Take 500-1,000 mg by mouth. 2 tabs in the am, 1 tab in the pm, Disp: , Rfl:  [DISCONTINUED] potassium chloride SA (K-DUR,KLOR-CON) 20 MEQ tablet, Take 1 tablet (20 mEq total) by mouth daily., Disp: 14 tablet, Rfl: 3  Allergies: No Known Allergies  Past Medical History, Surgical history, Social history, and Family History were reviewed and updated.  Review of Systems: Constitutional:  Negative for fever, chills, night sweats, anorexia, weight loss, pain. Cardiovascular: no chest pain or dyspnea on exertion Respiratory: no cough, shortness of breath, or wheezing Neurological: no TIA or stroke symptoms Dermatological: negative ENT: negative Skin: Negative. Gastrointestinal: no abdominal pain, change in bowel habits, or black or bloody stools Genito-Urinary: no dysuria, trouble voiding, or hematuria Hematological and Lymphatic: negative Breast: negative for breast lumps Musculoskeletal: negative Remaining ROS negative.  Physical Exam: Blood pressure 142/65, pulse 50, temperature 97 F (36.1 C), temperature source Oral, resp. rate 18, height 5\' 9"  (1.753 m), weight 154 lb 6.4 oz (70.035 kg). ECOG:  General appearance: alert, cooperative and no distress Head: Normocephalic, without obvious abnormality, atraumatic Neck: no adenopathy, no carotid bruit, no JVD,  supple, symmetrical, trachea midline and thyroid not enlarged, symmetric, no tenderness/mass/nodules Lymph nodes: Cervical, supraclavicular, and axillary nodes normal. Heart:regular rate and rhythm, S1, S2 normal, no murmur, click, rub or  gallop Lung:chest clear, no wheezing, rales, normal symmetric air entry, no tachypnea, retractions or cyanosis Abdomen: soft, non-tender, without masses or organomegaly EXT:no erythema, induration, or nodules   Lab Results: Lab Results  Component Value Date   WBC 4.6 01/08/2013   HGB 10.3* 01/08/2013   HCT 34.4* 01/08/2013   MCV 85.4 01/08/2013   PLT 110* 01/08/2013     Chemistry      Component Value Date/Time   NA 140 09/04/2012 1309   NA 137 08/29/2012 1443   K 4.7 09/04/2012 1309   K 5.4* 08/29/2012 1443   CL 101 09/04/2012 1309   CL 101 08/29/2012 1443   CO2 31* 09/04/2012 1309   CO2 32 08/29/2012 1443   BUN 41.0* 09/04/2012 1309   BUN 39* 08/29/2012 1443   CREATININE 2.2* 09/04/2012 1309   CREATININE 2.03* 08/29/2012 1443      Component Value Date/Time   CALCIUM 9.3 09/04/2012 1309   CALCIUM 9.5 08/29/2012 1443   ALKPHOS 73 09/04/2012 1309   ALKPHOS 67 08/29/2012 1443   AST 18 09/04/2012 1309   AST 20 08/29/2012 1443   ALT 21 09/04/2012 1309   ALT 19 08/29/2012 1443   BILITOT 0.40 09/04/2012 1309   BILITOT 0.3 08/29/2012 1443     Impression and Plan: This is an 79 year old gentleman with the following issues: 1. Anemia, multifactorial. Likely due to chronic disease and renal insufficiency. Hemoglobin is 10.3 today. Aranesp was held. Recommend that he continue lab check every 3 weeks and Aranesp if Hgb <10.0. 2. Thrombocytopenia, mild. We will continue to watch this with future lab draws. He has no active bleeding. 3. Hypertension. Blood pressure is controlled with hydralazine, Coreg, and Lasix. 4. Coronary artery disease. He remains on aspirin, Coreg, and Imdur. 5. Followup. The patient will continue to have a lab and Aranesp injection every 3 weeks. Visit in approximately 12 weeks.  Spent more than half the time coordinating care.    [place], [place] 1/29/20143:45 PM

## 2013-01-09 ENCOUNTER — Ambulatory Visit: Payer: Medicare Other | Admitting: Physical Therapy

## 2013-01-13 ENCOUNTER — Ambulatory Visit: Payer: Medicare Other | Attending: Neurology | Admitting: Physical Therapy

## 2013-01-13 DIAGNOSIS — IMO0001 Reserved for inherently not codable concepts without codable children: Secondary | ICD-10-CM | POA: Diagnosis not present

## 2013-01-13 DIAGNOSIS — M6281 Muscle weakness (generalized): Secondary | ICD-10-CM | POA: Insufficient documentation

## 2013-01-13 DIAGNOSIS — R269 Unspecified abnormalities of gait and mobility: Secondary | ICD-10-CM | POA: Diagnosis not present

## 2013-01-15 ENCOUNTER — Ambulatory Visit: Payer: Medicare Other | Admitting: Physical Therapy

## 2013-01-15 ENCOUNTER — Telehealth: Payer: Self-pay | Admitting: Cardiology

## 2013-01-15 DIAGNOSIS — R269 Unspecified abnormalities of gait and mobility: Secondary | ICD-10-CM | POA: Diagnosis not present

## 2013-01-15 DIAGNOSIS — IMO0001 Reserved for inherently not codable concepts without codable children: Secondary | ICD-10-CM | POA: Diagnosis not present

## 2013-01-15 DIAGNOSIS — M6281 Muscle weakness (generalized): Secondary | ICD-10-CM | POA: Diagnosis not present

## 2013-01-15 MED ORDER — NITROGLYCERIN 0.4 MG SL SUBL: 0.4000 mg | SUBLINGUAL_TABLET | SUBLINGUAL | Status: DC | PRN

## 2013-01-15 MED ORDER — CARVEDILOL 25 MG PO TABS: 25.0000 mg | ORAL_TABLET | Freq: Two times a day (BID) | ORAL | Status: DC

## 2013-01-15 NOTE — Telephone Encounter (Signed)
New Problem    Refill Request Carbedilol 25 mg Nitroglycerin 0.4 mg  Both to [facility] on [address]

## 2013-01-20 ENCOUNTER — Ambulatory Visit: Payer: Medicare Other | Admitting: Physical Therapy

## 2013-01-20 DIAGNOSIS — IMO0001 Reserved for inherently not codable concepts without codable children: Secondary | ICD-10-CM | POA: Diagnosis not present

## 2013-01-20 DIAGNOSIS — M6281 Muscle weakness (generalized): Secondary | ICD-10-CM | POA: Diagnosis not present

## 2013-01-20 DIAGNOSIS — R269 Unspecified abnormalities of gait and mobility: Secondary | ICD-10-CM | POA: Diagnosis not present

## 2013-01-24 ENCOUNTER — Other Ambulatory Visit: Payer: Self-pay | Admitting: *Deleted

## 2013-01-24 ENCOUNTER — Encounter: Payer: Self-pay | Admitting: *Deleted

## 2013-01-24 ENCOUNTER — Ambulatory Visit: Payer: Medicare Other | Admitting: Physical Therapy

## 2013-01-24 DIAGNOSIS — I2510 Atherosclerotic heart disease of native coronary artery without angina pectoris: Secondary | ICD-10-CM

## 2013-01-24 DIAGNOSIS — J90 Pleural effusion, not elsewhere classified: Secondary | ICD-10-CM

## 2013-01-28 ENCOUNTER — Encounter: Payer: Medicare Other | Admitting: Surgery

## 2013-01-29 ENCOUNTER — Other Ambulatory Visit: Payer: Medicare Other | Admitting: Lab

## 2013-01-29 ENCOUNTER — Ambulatory Visit (HOSPITAL_BASED_OUTPATIENT_CLINIC_OR_DEPARTMENT_OTHER): Payer: Medicare Other

## 2013-01-29 VITALS — BP 138/63 | HR 51 | Temp 97.5°F

## 2013-01-29 DIAGNOSIS — I5022 Chronic systolic (congestive) heart failure: Secondary | ICD-10-CM

## 2013-01-29 DIAGNOSIS — I10 Essential (primary) hypertension: Secondary | ICD-10-CM

## 2013-01-29 DIAGNOSIS — I214 Non-ST elevation (NSTEMI) myocardial infarction: Secondary | ICD-10-CM

## 2013-01-29 DIAGNOSIS — I2510 Atherosclerotic heart disease of native coronary artery without angina pectoris: Secondary | ICD-10-CM

## 2013-01-29 DIAGNOSIS — N189 Chronic kidney disease, unspecified: Secondary | ICD-10-CM

## 2013-01-29 DIAGNOSIS — E876 Hypokalemia: Secondary | ICD-10-CM

## 2013-01-29 DIAGNOSIS — L02419 Cutaneous abscess of limb, unspecified: Secondary | ICD-10-CM

## 2013-01-29 DIAGNOSIS — D649 Anemia, unspecified: Secondary | ICD-10-CM

## 2013-01-29 DIAGNOSIS — I255 Ischemic cardiomyopathy: Secondary | ICD-10-CM

## 2013-01-29 DIAGNOSIS — E785 Hyperlipidemia, unspecified: Secondary | ICD-10-CM

## 2013-01-29 DIAGNOSIS — J90 Pleural effusion, not elsewhere classified: Secondary | ICD-10-CM

## 2013-01-29 DIAGNOSIS — I5023 Acute on chronic systolic (congestive) heart failure: Secondary | ICD-10-CM

## 2013-01-29 LAB — CBC WITH DIFFERENTIAL/PLATELET
Basophils Absolute: 0 10*3/uL (ref 0.0–0.1)
Eosinophils Absolute: 0 10*3/uL (ref 0.0–0.5)
HCT: 31.6 % — ABNORMAL LOW (ref 38.4–49.9)
HGB: 9.9 g/dL — ABNORMAL LOW (ref 13.0–17.1)
MCV: 84.8 fL (ref 79.3–98.0)
MONO%: 9.2 % (ref 0.0–14.0)
NEUT#: 2.7 10*3/uL (ref 1.5–6.5)
NEUT%: 62.8 % (ref 39.0–75.0)
RDW: 21.8 % — ABNORMAL HIGH (ref 11.0–14.6)

## 2013-01-29 MED ORDER — DARBEPOETIN ALFA-POLYSORBATE 300 MCG/0.6ML IJ SOLN
300.0000 ug | Freq: Once | INTRAMUSCULAR | Status: AC
  Administered 2013-01-29: 300 ug via SUBCUTANEOUS
  Filled 2013-01-29: qty 0.6

## 2013-01-30 ENCOUNTER — Ambulatory Visit (INDEPENDENT_AMBULATORY_CARE_PROVIDER_SITE_OTHER): Payer: Medicare Other | Admitting: Surgery

## 2013-01-30 ENCOUNTER — Ambulatory Visit
Admission: RE | Admit: 2013-01-30 | Discharge: 2013-01-30 | Disposition: A | Discharge status: Home / Self Care | Payer: Medicare Other | Source: Ambulatory Visit | Attending: Surgery | Admitting: Surgery

## 2013-01-30 ENCOUNTER — Encounter: Payer: Self-pay | Admitting: Surgery

## 2013-01-30 VITALS — BP 134/57 | HR 56 | Resp 20 | Ht 69.0 in | Wt 152.0 lb

## 2013-01-30 DIAGNOSIS — J90 Pleural effusion, not elsewhere classified: Secondary | ICD-10-CM

## 2013-01-30 DIAGNOSIS — J398 Other specified diseases of upper respiratory tract: Secondary | ICD-10-CM | POA: Diagnosis not present

## 2013-01-30 DIAGNOSIS — J988 Other specified respiratory disorders: Secondary | ICD-10-CM | POA: Diagnosis not present

## 2013-01-30 DIAGNOSIS — Z09 Encounter for follow-up examination after completed treatment for conditions other than malignant neoplasm: Secondary | ICD-10-CM

## 2013-01-30 DIAGNOSIS — I2510 Atherosclerotic heart disease of native coronary artery without angina pectoris: Secondary | ICD-10-CM

## 2013-01-30 NOTE — Progress Notes (Signed)
[address]       [name] [address]             [phone]     HPI:  The patient returns today for followup of his recurrent left pleural effusion having under gone talc pleurodesis x2. He said that he is feeling well and denies any chest pain or shortness of breath. He has had no cough or sputum production. His peripheral edema has essentially resolved since starting twice a day lasix. Weight has been stable.   Current Outpatient Prescriptions  Medication Sig Dispense Refill  . aspirin EC 325 MG tablet Take 325 mg by mouth daily.      . carvedilol (COREG) 25 MG tablet Take 1 tablet (25 mg total) by mouth 2 (two) times daily with a meal.  60 tablet  5  . cholecalciferol (VITAMIN D) 1000 UNITS tablet Take 1,000 Units by mouth daily.       Marland Kitchen epoetin alfa (EPOGEN,PROCRIT) 2000 UNIT/ML injection Inject 2,000 Units into the skin once. Once every 3 weeks      . furosemide (LASIX) 40 MG tablet Take 40 mg by mouth 2 (two) times daily. CAN TAKE AN EXTRA TABLET IF WEIGHT GAIN.      . hydrALAZINE (APRESOLINE) 25 MG tablet Take 25 mg by mouth 3 (three) times daily.      . isosorbide mononitrate (IMDUR) 30 MG 24 hr tablet Take 30 mg by mouth daily.      . Multiple Vitamin (MULTIVITAMIN) capsule Take 1 capsule by mouth daily.       . nitroGLYCERIN (NITROSTAT) 0.4 MG SL tablet Place 1 tablet (0.4 mg total) under the tongue every 5 (five) minutes x 3 doses as needed. For chest pain  25 tablet  5  . omeprazole (PRILOSEC) 20 MG capsule Take 20 mg by mouth daily.       . Prenatal Vit-Fe Fumarate-FA (MULTIVITAMIN-PRENATAL) 27-0.8 MG TABS Take 1 tablet by mouth daily.      . Tamsulosin HCl (FLOMAX) 0.4 MG CAPS Take 1 capsule (0.4 mg total) by mouth daily after supper.  30 capsule  3  . Travoprost, BAK Free, (TRAVATAN) 0.004 % SOLN ophthalmic solution Place 1 drop into both eyes 2 (two) times daily.      . [DISCONTINUED] metFORMIN (GLUCOPHAGE) 500 MG tablet Take 500-1,000 mg by mouth. 2  tabs in the am, 1 tab in the pm      . [DISCONTINUED] potassium chloride SA (K-DUR,KLOR-CON) 20 MEQ tablet Take 1 tablet (20 mEq total) by mouth daily.  14 tablet  3   No current facility-administered medications for this visit.     Physical Exam:  BP 134/57  Pulse 56  Resp 20  Ht 5\' 9"  (1.753 m)  Wt 152 lb (68.947 kg)  BMI 22.44 kg/m2  SpO2 98% He looks well. Lung exam is clear. Cardiac exam shows a regular rate and rhythm with normal heart sounds. There is no peripheral edema.  Diagnostic Tests:  *RADIOLOGY REPORT*   Clinical Data: History of cardiac surgery 1 year ago.  Follow-up left pleural effusion.   CHEST - 2 VIEW   Comparison: 10/22/2012 and 09/24/2012.   Findings: The partially loculated left pleural effusion and adjacent left basilar pulmonary opacity are unchanged from the most recent study.  There is no right-sided pleural effusion and the right lung is clear.  Heart size and mediastinal contours are stable status post CABG and aortic valve replacement.   IMPRESSION: Stable  loculated left pleural effusions/pleural thickening and basilar air space disease.  No acute findings.     Original Report Authenticated By: Carey Bullocks, M.D.     Impression:  His chest x-ray is unchanged from his previous chest x-ray in November with a small loculated left pleural effusion. He is undergone talc pleurodesis twice on the left side and I would not expect this effusion to change. His chronic congestive heart failure seems to be under good control. I don't think it is necessary to followup this small left pleural effusion with further chest x-rays unless his breathing changes.  Plan:  He will continue followup with Dr. Daleen Squibb.

## 2013-02-04 ENCOUNTER — Ambulatory Visit: Payer: Medicare Other | Admitting: Physical Therapy

## 2013-02-04 ENCOUNTER — Other Ambulatory Visit: Payer: Self-pay | Admitting: Nurse Practitioner

## 2013-02-04 DIAGNOSIS — R269 Unspecified abnormalities of gait and mobility: Secondary | ICD-10-CM | POA: Diagnosis not present

## 2013-02-04 DIAGNOSIS — M6281 Muscle weakness (generalized): Secondary | ICD-10-CM | POA: Diagnosis not present

## 2013-02-04 DIAGNOSIS — IMO0001 Reserved for inherently not codable concepts without codable children: Secondary | ICD-10-CM | POA: Diagnosis not present

## 2013-02-12 DIAGNOSIS — H409 Unspecified glaucoma: Secondary | ICD-10-CM | POA: Diagnosis not present

## 2013-02-12 DIAGNOSIS — H2510 Age-related nuclear cataract, unspecified eye: Secondary | ICD-10-CM | POA: Diagnosis not present

## 2013-02-12 DIAGNOSIS — H4011X0 Primary open-angle glaucoma, stage unspecified: Secondary | ICD-10-CM | POA: Diagnosis not present

## 2013-02-19 ENCOUNTER — Ambulatory Visit: Payer: Medicare Other

## 2013-02-19 ENCOUNTER — Other Ambulatory Visit (HOSPITAL_BASED_OUTPATIENT_CLINIC_OR_DEPARTMENT_OTHER): Payer: Medicare Other

## 2013-02-19 DIAGNOSIS — I10 Essential (primary) hypertension: Secondary | ICD-10-CM

## 2013-02-19 DIAGNOSIS — I2510 Atherosclerotic heart disease of native coronary artery without angina pectoris: Secondary | ICD-10-CM

## 2013-02-19 DIAGNOSIS — E785 Hyperlipidemia, unspecified: Secondary | ICD-10-CM

## 2013-02-19 DIAGNOSIS — N189 Chronic kidney disease, unspecified: Secondary | ICD-10-CM

## 2013-02-19 DIAGNOSIS — I5022 Chronic systolic (congestive) heart failure: Secondary | ICD-10-CM

## 2013-02-19 DIAGNOSIS — I5023 Acute on chronic systolic (congestive) heart failure: Secondary | ICD-10-CM

## 2013-02-19 DIAGNOSIS — I214 Non-ST elevation (NSTEMI) myocardial infarction: Secondary | ICD-10-CM

## 2013-02-19 DIAGNOSIS — I255 Ischemic cardiomyopathy: Secondary | ICD-10-CM

## 2013-02-19 DIAGNOSIS — L03119 Cellulitis of unspecified part of limb: Secondary | ICD-10-CM

## 2013-02-19 DIAGNOSIS — D649 Anemia, unspecified: Secondary | ICD-10-CM | POA: Diagnosis not present

## 2013-02-19 DIAGNOSIS — N179 Acute kidney failure, unspecified: Secondary | ICD-10-CM

## 2013-02-19 DIAGNOSIS — E876 Hypokalemia: Secondary | ICD-10-CM

## 2013-02-19 DIAGNOSIS — J90 Pleural effusion, not elsewhere classified: Secondary | ICD-10-CM

## 2013-02-19 LAB — CBC WITH DIFFERENTIAL/PLATELET
Basophils Absolute: 0 10*3/uL (ref 0.0–0.1)
Eosinophils Absolute: 0.1 10*3/uL (ref 0.0–0.5)
HGB: 10.9 g/dL — ABNORMAL LOW (ref 13.0–17.1)
LYMPH%: 30 % (ref 14.0–49.0)
MCH: 27.6 pg (ref 27.2–33.4)
MCV: 86.8 fL (ref 79.3–98.0)
MONO%: 8 % (ref 0.0–14.0)
NEUT#: 2.9 10*3/uL (ref 1.5–6.5)
Platelets: 113 10*3/uL — ABNORMAL LOW (ref 140–400)
RBC: 3.93 10*6/uL — ABNORMAL LOW (ref 4.20–5.82)

## 2013-02-19 MED ORDER — DARBEPOETIN ALFA-POLYSORBATE 300 MCG/0.6ML IJ SOLN: 300.0000 ug | Freq: Once | INTRAMUSCULAR | Status: DC

## 2013-02-25 ENCOUNTER — Other Ambulatory Visit: Payer: Self-pay | Admitting: Nurse Practitioner

## 2013-02-27 ENCOUNTER — Other Ambulatory Visit: Payer: Self-pay | Admitting: *Deleted

## 2013-02-27 DIAGNOSIS — E119 Type 2 diabetes mellitus without complications: Secondary | ICD-10-CM

## 2013-02-27 DIAGNOSIS — I10 Essential (primary) hypertension: Secondary | ICD-10-CM

## 2013-03-03 ENCOUNTER — Other Ambulatory Visit: Payer: Medicare Other

## 2013-03-03 DIAGNOSIS — E119 Type 2 diabetes mellitus without complications: Secondary | ICD-10-CM

## 2013-03-03 DIAGNOSIS — I10 Essential (primary) hypertension: Secondary | ICD-10-CM | POA: Diagnosis not present

## 2013-03-04 ENCOUNTER — Other Ambulatory Visit: Payer: Self-pay

## 2013-03-05 LAB — CBC WITH DIFFERENTIAL/PLATELET
Basophils Absolute: 0 10*3/uL (ref 0.0–0.2)
Basos: 0 % (ref 0–3)
Eos: 3 % (ref 0–5)
Eosinophils Absolute: 0.2 10*3/uL (ref 0.0–0.4)
HCT: 39.8 % (ref 37.5–51.0)
Hemoglobin: 12.3 g/dL — ABNORMAL LOW (ref 12.6–17.7)
Immature Grans (Abs): 0 10*3/uL (ref 0.0–0.1)
Immature Granulocytes: 0 % (ref 0–2)
Lymphocytes Absolute: 2 10*3/uL (ref 0.7–3.1)
Lymphs: 34 % (ref 14–46)
MCH: 27.6 pg (ref 26.6–33.0)
MCHC: 30.9 g/dL — ABNORMAL LOW (ref 31.5–35.7)
MCV: 89 fL (ref 79–97)
Monocytes Absolute: 0.5 10*3/uL (ref 0.1–0.9)
Monocytes: 8 % (ref 4–12)
Neutrophils Absolute: 3.2 10*3/uL (ref 1.4–7.0)
Neutrophils Relative %: 55 % (ref 40–74)
RBC: 4.46 x10E6/uL (ref 4.14–5.80)
RDW: 18.5 % — ABNORMAL HIGH (ref 12.3–15.4)
WBC: 5.9 10*3/uL (ref 3.4–10.8)

## 2013-03-05 LAB — BASIC METABOLIC PANEL
BUN/Creatinine Ratio: 19 (ref 10–22)
BUN: 41 mg/dL — ABNORMAL HIGH (ref 8–27)
CO2: 32 mmol/L — ABNORMAL HIGH (ref 19–28)
Calcium: 10.2 mg/dL (ref 8.6–10.2)
Chloride: 102 mmol/L (ref 97–108)
Creatinine, Ser: 2.16 mg/dL — ABNORMAL HIGH (ref 0.76–1.27)
GFR calc Af Amer: 31 mL/min/{1.73_m2} — ABNORMAL LOW (ref >59)
GFR calc non Af Amer: 27 mL/min/{1.73_m2} — ABNORMAL LOW (ref >59)
Glucose: 141 mg/dL — ABNORMAL HIGH (ref 65–99)
Potassium: 6 mmol/L — ABNORMAL HIGH (ref 3.5–5.2)
Sodium: 146 mmol/L — ABNORMAL HIGH (ref 134–144)

## 2013-03-06 ENCOUNTER — Encounter: Payer: Self-pay | Admitting: Internal Medicine

## 2013-03-06 ENCOUNTER — Other Ambulatory Visit: Payer: Self-pay | Admitting: Oncology

## 2013-03-06 ENCOUNTER — Ambulatory Visit (INDEPENDENT_AMBULATORY_CARE_PROVIDER_SITE_OTHER): Payer: Medicare Other | Admitting: Internal Medicine

## 2013-03-06 VITALS — BP 158/82 | HR 52 | Temp 97.7°F | Resp 20 | Ht 67.0 in | Wt 145.0 lb

## 2013-03-06 DIAGNOSIS — E118 Type 2 diabetes mellitus with unspecified complications: Secondary | ICD-10-CM

## 2013-03-06 DIAGNOSIS — I10 Essential (primary) hypertension: Secondary | ICD-10-CM

## 2013-03-06 DIAGNOSIS — I2510 Atherosclerotic heart disease of native coronary artery without angina pectoris: Secondary | ICD-10-CM | POA: Diagnosis not present

## 2013-03-06 DIAGNOSIS — D649 Anemia, unspecified: Secondary | ICD-10-CM

## 2013-03-06 DIAGNOSIS — I5022 Chronic systolic (congestive) heart failure: Secondary | ICD-10-CM

## 2013-03-06 DIAGNOSIS — E875 Hyperkalemia: Secondary | ICD-10-CM | POA: Diagnosis not present

## 2013-03-06 NOTE — Assessment & Plan Note (Signed)
Is at goal on all home records.  Here was slightly elevated.  Likely anxiety in the doctor's office.  No changes made.  Continue walking and PT exercises that have been recommended for strengthening of his right leg.

## 2013-03-06 NOTE — Assessment & Plan Note (Signed)
No difficulties with chest pain or shortness of breath.

## 2013-03-06 NOTE — Progress Notes (Signed)
Subjective:    Patient ID: [name] [name], male    DOB: July 17, 1926, 79 y.o.   MRN: [id number]  HPI  Is still taking his exercises.  He finished his therapy for the right leg weakness.  Is 1000 times better than it was.  Is walking.    No chest pains or breathing trouble.    Weights stable at 147 for past month, bp excellent in 110s and sugars also in 110s consistently  Is through with Dr. Laneta Simmers, his cardiovascular surgeon--was released.  Sees Dr. Daleen Squibb now every 6 mos.    Potassium was high at 6 labs for this visit.  Reviewed dietary measures to keep this down.    Please disregard duplicates of ROS and PE.  Review of Systems  Constitutional: Negative for fever, chills, weight loss and diaphoresis.       Weight stable.  HENT: Positive for hearing loss. Negative for congestion and sore throat.   Eyes: Negative for blurred vision.  Respiratory: Negative for cough and shortness of breath.   Cardiovascular: Negative for chest pain and leg swelling.  Gastrointestinal: Negative for heartburn, nausea, vomiting, abdominal pain and diarrhea.  Genitourinary: Negative for dysuria, urgency and frequency.       Nocturia typically x 2  Musculoskeletal: Negative for myalgias, back pain and falls.  Skin: Negative for rash.  Neurological: Positive for focal weakness. Negative for dizziness, loss of consciousness and weakness.       Right leg weakness has improved  Hematological: Bruises/bleeds easily.  Psychiatric/Behavioral: Negative for depression and memory loss. The patient is not nervous/anxious and does not have insomnia.    Review of Systems  Constitutional: Negative for fever, chills, weight loss and diaphoresis.       Weight stable.  HENT: Positive for hearing loss. Negative for congestion and sore throat.   Eyes: Negative for blurred vision.  Respiratory: Negative for cough and shortness of breath.   Cardiovascular: Negative for chest pain and leg swelling.  Gastrointestinal:  Negative for heartburn, nausea, vomiting, abdominal pain and diarrhea.  Genitourinary: Negative for dysuria, urgency and frequency.       Nocturia typically x 2  Musculoskeletal: Negative for myalgias, back pain and falls.  Skin: Negative for rash.  Neurological: Positive for focal weakness. Negative for dizziness, loss of consciousness and weakness.       Right leg weakness has improved  Endo/Heme/Allergies: Bruises/bleeds easily.  Psychiatric/Behavioral: Negative for depression and memory loss. The patient is not nervous/anxious and does not have insomnia.         Objective:   Physical Exam  Constitutional: He is oriented to person, place, and time. He appears well-developed and well-nourished.  HENT:  Head: Normocephalic and atraumatic.  Eyes: Pupils are equal, round, and reactive to light.  Neck: Neck supple. No JVD present.  Cardiovascular: Normal rate, regular rhythm and normal heart sounds.   Pulmonary/Chest: Effort normal and breath sounds normal.  Abdominal: Soft. Bowel sounds are normal.  Musculoskeletal: He exhibits no edema and no tenderness.  Slight limp of right leg when first stands, improves as he walks  Neurological: He is alert and oriented to person, place, and time. No cranial nerve deficit.  Skin: Skin is warm and dry.  Psychiatric: He has a normal mood and affect. His behavior is normal. Judgment and thought content normal.   Physical Exam  Constitutional: He is oriented to person, place, and time. He appears well-developed and well-nourished.  HENT:  Head: Normocephalic and atraumatic.  Eyes: Pupils are  equal, round, and reactive to light.  Neck: Neck supple. No JVD present.  Cardiovascular: Normal rate, regular rhythm and normal heart sounds.   Respiratory: Effort normal and breath sounds normal.  GI: Soft. Bowel sounds are normal.  Musculoskeletal: He exhibits no edema and no tenderness.  Slight limp of right leg when first stands, improves as he walks   Neurological: He is alert and oriented to person, place, and time. No cranial nerve deficit.  Skin: Skin is warm and dry.  Psychiatric: He has a normal mood and affect. His behavior is normal. Judgment and thought content normal.        Assessment & Plan:  CAD No difficulties with chest pain or shortness of breath.    Chronic systolic heart failure Denies any new edema.  Weights have been stable at 147 on his home record.  Doing wonderfully.  No changes needed to appropriate med regimen.  Hypertension Is at goal on all home records.  Here was slightly elevated.  Likely anxiety in the doctor's office.  No changes made.  Continue walking and PT exercises that have been recommended for strengthening of his right leg.  Hyperkalemia Eats a lot of potassium containing foods--counseled by MA when labs returned and again by me today.  He expresses understanding.    Anemia Last hgb when saw Dr. Clelia Croft was 10.5 so did not require procrit injection.

## 2013-03-06 NOTE — Assessment & Plan Note (Signed)
Last hgb when saw Dr. Clelia Croft was 10.5 so did not require procrit injection.

## 2013-03-06 NOTE — Assessment & Plan Note (Signed)
Denies any new edema.  Weights have been stable at 147 on his home record.  Doing wonderfully.  No changes needed to appropriate med regimen.

## 2013-03-06 NOTE — Assessment & Plan Note (Signed)
Eats a lot of potassium containing foods--counseled by MA when labs returned and again by me today.  He expresses understanding.

## 2013-03-12 ENCOUNTER — Other Ambulatory Visit (HOSPITAL_BASED_OUTPATIENT_CLINIC_OR_DEPARTMENT_OTHER): Payer: Medicare Other | Admitting: Lab

## 2013-03-12 ENCOUNTER — Ambulatory Visit: Payer: Medicare Other

## 2013-03-12 DIAGNOSIS — I255 Ischemic cardiomyopathy: Secondary | ICD-10-CM

## 2013-03-12 DIAGNOSIS — I10 Essential (primary) hypertension: Secondary | ICD-10-CM

## 2013-03-12 DIAGNOSIS — D649 Anemia, unspecified: Secondary | ICD-10-CM

## 2013-03-12 DIAGNOSIS — I5022 Chronic systolic (congestive) heart failure: Secondary | ICD-10-CM

## 2013-03-12 DIAGNOSIS — E876 Hypokalemia: Secondary | ICD-10-CM

## 2013-03-12 DIAGNOSIS — E785 Hyperlipidemia, unspecified: Secondary | ICD-10-CM

## 2013-03-12 DIAGNOSIS — N189 Chronic kidney disease, unspecified: Secondary | ICD-10-CM

## 2013-03-12 DIAGNOSIS — J90 Pleural effusion, not elsewhere classified: Secondary | ICD-10-CM

## 2013-03-12 DIAGNOSIS — L03119 Cellulitis of unspecified part of limb: Secondary | ICD-10-CM

## 2013-03-12 DIAGNOSIS — I2510 Atherosclerotic heart disease of native coronary artery without angina pectoris: Secondary | ICD-10-CM

## 2013-03-12 DIAGNOSIS — I214 Non-ST elevation (NSTEMI) myocardial infarction: Secondary | ICD-10-CM

## 2013-03-12 DIAGNOSIS — I5023 Acute on chronic systolic (congestive) heart failure: Secondary | ICD-10-CM

## 2013-03-12 LAB — CBC WITH DIFFERENTIAL/PLATELET
Eosinophils Absolute: 0.1 10*3/uL (ref 0.0–0.5)
MONO#: 0.4 10*3/uL (ref 0.1–0.9)
NEUT#: 3.4 10*3/uL (ref 1.5–6.5)
RBC: 3.93 10*6/uL — ABNORMAL LOW (ref 4.20–5.82)
RDW: 20 % — ABNORMAL HIGH (ref 11.0–14.6)
WBC: 5.4 10*3/uL (ref 4.0–10.3)
lymph#: 1.6 10*3/uL (ref 0.9–3.3)

## 2013-03-12 MED ORDER — DARBEPOETIN ALFA-POLYSORBATE 300 MCG/0.6ML IJ SOLN: 300.0000 ug | Freq: Once | INTRAMUSCULAR | Status: DC

## 2013-04-02 ENCOUNTER — Ambulatory Visit (HOSPITAL_BASED_OUTPATIENT_CLINIC_OR_DEPARTMENT_OTHER): Payer: Medicare Other | Admitting: Oncology

## 2013-04-02 ENCOUNTER — Other Ambulatory Visit (HOSPITAL_BASED_OUTPATIENT_CLINIC_OR_DEPARTMENT_OTHER): Payer: Medicare Other | Admitting: Lab

## 2013-04-02 ENCOUNTER — Ambulatory Visit: Payer: Medicare Other

## 2013-04-02 ENCOUNTER — Telehealth: Payer: Self-pay | Admitting: Oncology

## 2013-04-02 VITALS — BP 136/58 | HR 53 | Temp 97.9°F | Resp 18 | Ht 67.0 in | Wt 145.0 lb

## 2013-04-02 DIAGNOSIS — D649 Anemia, unspecified: Secondary | ICD-10-CM

## 2013-04-02 DIAGNOSIS — N189 Chronic kidney disease, unspecified: Secondary | ICD-10-CM

## 2013-04-02 LAB — CBC WITH DIFFERENTIAL/PLATELET
Basophils Absolute: 0 10*3/uL (ref 0.0–0.1)
Eosinophils Absolute: 0.1 10*3/uL (ref 0.0–0.5)
HGB: 10.8 g/dL — ABNORMAL LOW (ref 13.0–17.1)
LYMPH%: 29 % (ref 14.0–49.0)
MCV: 90.2 fL (ref 79.3–98.0)
MONO#: 0.4 10*3/uL (ref 0.1–0.9)
MONO%: 7.7 % (ref 0.0–14.0)
NEUT#: 3 10*3/uL (ref 1.5–6.5)
Platelets: 114 10*3/uL — ABNORMAL LOW (ref 140–400)
WBC: 4.9 10*3/uL (ref 4.0–10.3)

## 2013-04-02 MED ORDER — DARBEPOETIN ALFA-POLYSORBATE 300 MCG/0.6ML IJ SOLN: 300.0000 ug | Freq: Once | INTRAMUSCULAR | Status: DC

## 2013-04-02 NOTE — Telephone Encounter (Signed)
Printed the pt the avs schedule for may-July 2014

## 2013-04-02 NOTE — Progress Notes (Signed)
Hematology and Oncology Follow Up Visit  [name] [name] [id number] 1926/01/09 79 y.o. 04/02/2013 2:48 PM [name], [name], DOReed, [name] L, DO   Principle Diagnosis: anemia that is multifactorial likely due to chronic disease and renal insufficiency  Current therapy: Aranesp 300 mcg every 3 weeks for a hemoglobin less than 10  Interim History:  [name] [name] returns for routine followup with his wife. He is receiving Aranesp and tolerating well. He has noticed little improvement in terms of his fatigue. He denies chest pain, shortness of breath, dyspnea. He has not noticed any bleeding. Appetite is improving and weight is stable. Lower extremity edema is stable. No bleeding. His quality of life is improving at this time.   Medications: I have reviewed the patient's current medications. Current outpatient prescriptions:aspirin EC 325 MG tablet, Take 325 mg by mouth daily., Disp: , Rfl: ;  carvedilol (COREG) 25 MG tablet, Take 1 tablet (25 mg total) by mouth 2 (two) times daily with a meal., Disp: 60 tablet, Rfl: 5;  cholecalciferol (VITAMIN D) 1000 UNITS tablet, Take 1,000 Units by mouth daily. , Disp: , Rfl:  epoetin alfa (EPOGEN,PROCRIT) 2000 UNIT/ML injection, Inject 2,000 Units into the skin once. Once every 3 weeks, Disp: , Rfl: ;  furosemide (LASIX) 40 MG tablet, Take 40 mg by mouth 2 (two) times daily. CAN TAKE AN EXTRA TABLET IF WEIGHT GAIN., Disp: , Rfl: ;  hydrALAZINE (APRESOLINE) 25 MG tablet, TAKE 1 TABLET 3 TIMES A DAY, Disp: 90 tablet, Rfl: 3 isosorbide mononitrate (IMDUR) 30 MG 24 hr tablet, TAKE 1 TABLET (30 MG TOTAL) BY MOUTH DAILY., Disp: 30 tablet, Rfl: 6;  Multiple Vitamin (MULTIVITAMIN) capsule, Take 1 capsule by mouth daily. , Disp: , Rfl: ;  nitroGLYCERIN (NITROSTAT) 0.4 MG SL tablet, Place 1 tablet (0.4 mg total) under the tongue every 5 (five) minutes x 3 doses as needed. For chest pain, Disp: 25 tablet, Rfl: 5 omeprazole (PRILOSEC) 20 MG capsule, Take 20 mg by mouth daily. ,  Disp: , Rfl: ;  Prenatal Vit-Fe Fumarate-FA (MULTIVITAMIN-PRENATAL) 27-0.8 MG TABS, Take 1 tablet by mouth daily., Disp: , Rfl: ;  Tamsulosin HCl (FLOMAX) 0.4 MG CAPS, Take 1 capsule (0.4 mg total) by mouth daily after supper., Disp: 30 capsule, Rfl: 3;  Travoprost, BAK Free, (TRAVATAN) 0.004 % SOLN ophthalmic solution, Place 1 drop into both eyes 2 (two) times daily., Disp: , Rfl:  [DISCONTINUED] metFORMIN (GLUCOPHAGE) 500 MG tablet, Take 500-1,000 mg by mouth. 2 tabs in the am, 1 tab in the pm, Disp: , Rfl: ;  [DISCONTINUED] potassium chloride SA (K-DUR,KLOR-CON) 20 MEQ tablet, Take 1 tablet (20 mEq total) by mouth daily., Disp: 14 tablet, Rfl: 3  Allergies: No Known Allergies  Past Medical History, Surgical history, Social history, and Family History were reviewed and updated.  Review of Systems: Constitutional:  Negative for fever, chills, night sweats, anorexia, weight loss, pain. Cardiovascular: no chest pain or dyspnea on exertion Respiratory: no cough, shortness of breath, or wheezing Neurological: no TIA or stroke symptoms Dermatological: negative ENT: negative Skin: Negative. Gastrointestinal: no abdominal pain, change in bowel habits, or black or bloody stools Genito-Urinary: no dysuria, trouble voiding, or hematuria Hematological and Lymphatic: negative Breast: negative for breast lumps Musculoskeletal: negative Remaining ROS negative.  Physical Exam: Blood pressure 136/58, pulse 53, temperature 97.9 F (36.6 C), temperature source Oral, resp. rate 18, height 5\' 7"  (1.702 m), weight 145 lb (65.772 kg). ECOG: 1 General appearance: alert, cooperative and no distress Head: Normocephalic, without obvious abnormality, atraumatic  Neck: no adenopathy, no carotid bruit, no JVD, supple, symmetrical, trachea midline and thyroid not enlarged, symmetric, no tenderness/mass/nodules Lymph nodes: Cervical, supraclavicular, and axillary nodes normal. Heart:regular rate and rhythm, S1, S2  normal, no murmur, click, rub or gallop Lung:chest clear, no wheezing, rales, normal symmetric air entry, no tachypnea, retractions or cyanosis Abdomen: soft, non-tender, without masses or organomegaly EXT:no erythema, induration, or nodules   Lab Results: Lab Results  Component Value Date   WBC 4.9 04/02/2013   HGB 10.8* 04/02/2013   HCT 32.7* 04/02/2013   MCV 90.2 04/02/2013   PLT 114* 04/02/2013     Chemistry      Component Value Date/Time   NA 146* 03/03/2013 0918   NA 143 01/08/2013 1152   NA 140 09/04/2012 1309   K 6.0* 03/03/2013 0918   K 4.7 09/04/2012 1309   CL 102 03/03/2013 0918   CL 101 09/04/2012 1309   CO2 32* 03/03/2013 0918   CO2 31* 09/04/2012 1309   BUN 41* 03/03/2013 0918   BUN 40* 01/08/2013 1152   BUN 41.0* 09/04/2012 1309   CREATININE 2.16* 03/03/2013 0918   CREATININE 2.21* 01/08/2013 1152   CREATININE 2.2* 09/04/2012 1309      Component Value Date/Time   CALCIUM 10.2 03/03/2013 0918   CALCIUM 9.3 09/04/2012 1309   ALKPHOS 73 09/04/2012 1309   ALKPHOS 67 08/29/2012 1443   AST 18 09/04/2012 1309   AST 20 08/29/2012 1443   ALT 21 09/04/2012 1309   ALT 19 08/29/2012 1443   BILITOT 0.40 09/04/2012 1309   BILITOT 0.3 08/29/2012 1443     Impression and Plan: This is an 79 year old gentleman with the following issues: 1. Anemia, multifactorial. Likely due to chronic disease and renal insufficiency. Hemoglobin is 10.8 today. Aranesp was held. Recommend that he continue lab check every 3 weeks and Aranesp if Hgb <10.0. 2. Thrombocytopenia, mild. We will continue to watch this with future lab draws. He has no active bleeding. 3. Hypertension. Blood pressure is controlled with hydralazine, Coreg, and Lasix. 4. Coronary artery disease. He remains on aspirin, Coreg, and Imdur. 5. Followup. The patient will continue to have a lab and Aranesp injection every 3 weeks. Visit in approximately 12 to 16 weeks.     [name] [name] 4/23/20142:48 PM

## 2013-04-07 ENCOUNTER — Inpatient Hospital Stay (HOSPITAL_COMMUNITY)
Admission: EM | Admit: 2013-04-07 | Discharge: 2013-04-15 | DRG: 871 | Disposition: A | Discharge status: Skilled Nursing Facility | Payer: Medicare Other | Attending: Family Medicine | Admitting: Family Medicine

## 2013-04-07 ENCOUNTER — Emergency Department (HOSPITAL_COMMUNITY): Payer: Medicare Other

## 2013-04-07 ENCOUNTER — Encounter (HOSPITAL_COMMUNITY): Payer: Self-pay | Admitting: *Deleted

## 2013-04-07 DIAGNOSIS — I959 Hypotension, unspecified: Secondary | ICD-10-CM | POA: Diagnosis present

## 2013-04-07 DIAGNOSIS — A4151 Sepsis due to Escherichia coli [E. coli]: Principal | ICD-10-CM | POA: Diagnosis present

## 2013-04-07 DIAGNOSIS — I10 Essential (primary) hypertension: Secondary | ICD-10-CM | POA: Diagnosis not present

## 2013-04-07 DIAGNOSIS — E785 Hyperlipidemia, unspecified: Secondary | ICD-10-CM

## 2013-04-07 DIAGNOSIS — A4159 Other Gram-negative sepsis: Secondary | ICD-10-CM | POA: Diagnosis not present

## 2013-04-07 DIAGNOSIS — K219 Gastro-esophageal reflux disease without esophagitis: Secondary | ICD-10-CM | POA: Diagnosis present

## 2013-04-07 DIAGNOSIS — Z85038 Personal history of other malignant neoplasm of large intestine: Secondary | ICD-10-CM

## 2013-04-07 DIAGNOSIS — J189 Pneumonia, unspecified organism: Secondary | ICD-10-CM | POA: Diagnosis not present

## 2013-04-07 DIAGNOSIS — I359 Nonrheumatic aortic valve disorder, unspecified: Secondary | ICD-10-CM | POA: Diagnosis present

## 2013-04-07 DIAGNOSIS — E875 Hyperkalemia: Secondary | ICD-10-CM

## 2013-04-07 DIAGNOSIS — I428 Other cardiomyopathies: Secondary | ICD-10-CM | POA: Diagnosis present

## 2013-04-07 DIAGNOSIS — R079 Chest pain, unspecified: Secondary | ICD-10-CM

## 2013-04-07 DIAGNOSIS — Z8673 Personal history of transient ischemic attack (TIA), and cerebral infarction without residual deficits: Secondary | ICD-10-CM

## 2013-04-07 DIAGNOSIS — I2589 Other forms of chronic ischemic heart disease: Secondary | ICD-10-CM | POA: Diagnosis present

## 2013-04-07 DIAGNOSIS — I5022 Chronic systolic (congestive) heart failure: Secondary | ICD-10-CM | POA: Diagnosis not present

## 2013-04-07 DIAGNOSIS — A419 Sepsis, unspecified organism: Secondary | ICD-10-CM | POA: Diagnosis present

## 2013-04-07 DIAGNOSIS — E119 Type 2 diabetes mellitus without complications: Secondary | ICD-10-CM | POA: Diagnosis present

## 2013-04-07 DIAGNOSIS — E876 Hypokalemia: Secondary | ICD-10-CM | POA: Diagnosis present

## 2013-04-07 DIAGNOSIS — J90 Pleural effusion, not elsewhere classified: Secondary | ICD-10-CM

## 2013-04-07 DIAGNOSIS — D649 Anemia, unspecified: Secondary | ICD-10-CM | POA: Diagnosis not present

## 2013-04-07 DIAGNOSIS — Z951 Presence of aortocoronary bypass graft: Secondary | ICD-10-CM | POA: Diagnosis not present

## 2013-04-07 DIAGNOSIS — R279 Unspecified lack of coordination: Secondary | ICD-10-CM | POA: Diagnosis not present

## 2013-04-07 DIAGNOSIS — I252 Old myocardial infarction: Secondary | ICD-10-CM

## 2013-04-07 DIAGNOSIS — I2510 Atherosclerotic heart disease of native coronary artery without angina pectoris: Secondary | ICD-10-CM | POA: Diagnosis present

## 2013-04-07 DIAGNOSIS — Z66 Do not resuscitate: Secondary | ICD-10-CM | POA: Diagnosis present

## 2013-04-07 DIAGNOSIS — Z954 Presence of other heart-valve replacement: Secondary | ICD-10-CM | POA: Diagnosis not present

## 2013-04-07 DIAGNOSIS — R7881 Bacteremia: Secondary | ICD-10-CM | POA: Diagnosis not present

## 2013-04-07 DIAGNOSIS — N179 Acute kidney failure, unspecified: Secondary | ICD-10-CM | POA: Diagnosis present

## 2013-04-07 DIAGNOSIS — J9600 Acute respiratory failure, unspecified whether with hypoxia or hypercapnia: Secondary | ICD-10-CM | POA: Diagnosis not present

## 2013-04-07 DIAGNOSIS — R6521 Severe sepsis with septic shock: Secondary | ICD-10-CM | POA: Diagnosis present

## 2013-04-07 DIAGNOSIS — I5042 Chronic combined systolic (congestive) and diastolic (congestive) heart failure: Secondary | ICD-10-CM | POA: Diagnosis present

## 2013-04-07 DIAGNOSIS — I129 Hypertensive chronic kidney disease with stage 1 through stage 4 chronic kidney disease, or unspecified chronic kidney disease: Secondary | ICD-10-CM | POA: Diagnosis present

## 2013-04-07 DIAGNOSIS — G40909 Epilepsy, unspecified, not intractable, without status epilepticus: Secondary | ICD-10-CM | POA: Diagnosis not present

## 2013-04-07 DIAGNOSIS — I25110 Atherosclerotic heart disease of native coronary artery with unstable angina pectoris: Secondary | ICD-10-CM | POA: Diagnosis present

## 2013-04-07 DIAGNOSIS — D696 Thrombocytopenia, unspecified: Secondary | ICD-10-CM | POA: Diagnosis present

## 2013-04-07 DIAGNOSIS — I059 Rheumatic mitral valve disease, unspecified: Secondary | ICD-10-CM | POA: Diagnosis present

## 2013-04-07 DIAGNOSIS — Z452 Encounter for adjustment and management of vascular access device: Secondary | ICD-10-CM | POA: Diagnosis not present

## 2013-04-07 DIAGNOSIS — M6281 Muscle weakness (generalized): Secondary | ICD-10-CM | POA: Diagnosis not present

## 2013-04-07 DIAGNOSIS — R269 Unspecified abnormalities of gait and mobility: Secondary | ICD-10-CM | POA: Diagnosis not present

## 2013-04-07 DIAGNOSIS — I214 Non-ST elevation (NSTEMI) myocardial infarction: Secondary | ICD-10-CM

## 2013-04-07 DIAGNOSIS — N189 Chronic kidney disease, unspecified: Secondary | ICD-10-CM

## 2013-04-07 DIAGNOSIS — I255 Ischemic cardiomyopathy: Secondary | ICD-10-CM

## 2013-04-07 DIAGNOSIS — I498 Other specified cardiac arrhythmias: Secondary | ICD-10-CM | POA: Diagnosis not present

## 2013-04-07 DIAGNOSIS — J811 Chronic pulmonary edema: Secondary | ICD-10-CM | POA: Diagnosis not present

## 2013-04-07 DIAGNOSIS — R0989 Other specified symptoms and signs involving the circulatory and respiratory systems: Secondary | ICD-10-CM | POA: Diagnosis not present

## 2013-04-07 DIAGNOSIS — I509 Heart failure, unspecified: Secondary | ICD-10-CM

## 2013-04-07 DIAGNOSIS — R5381 Other malaise: Secondary | ICD-10-CM | POA: Diagnosis not present

## 2013-04-07 DIAGNOSIS — L02419 Cutaneous abscess of limb, unspecified: Secondary | ICD-10-CM

## 2013-04-07 DIAGNOSIS — R072 Precordial pain: Secondary | ICD-10-CM | POA: Diagnosis not present

## 2013-04-07 LAB — CBC WITH DIFFERENTIAL/PLATELET
HCT: 35.7 % — ABNORMAL LOW (ref 39.0–52.0)
Hemoglobin: 12 g/dL — ABNORMAL LOW (ref 13.0–17.0)
Lymphs Abs: 1.8 10*3/uL (ref 0.7–4.0)
MCH: 29.6 pg (ref 26.0–34.0)
Monocytes Relative: 9 % (ref 3–12)
Neutro Abs: 3.5 10*3/uL (ref 1.7–7.7)
Neutrophils Relative %: 59 % (ref 43–77)
RBC: 4.05 MIL/uL — ABNORMAL LOW (ref 4.22–5.81)

## 2013-04-07 LAB — COMPREHENSIVE METABOLIC PANEL
Alkaline Phosphatase: 109 U/L (ref 39–117)
BUN: 42 mg/dL — ABNORMAL HIGH (ref 6–23)
Chloride: 99 mEq/L (ref 96–112)
GFR calc Af Amer: 29 mL/min — ABNORMAL LOW (ref >90)
GFR calc non Af Amer: 25 mL/min — ABNORMAL LOW (ref >90)
Glucose, Bld: 150 mg/dL — ABNORMAL HIGH (ref 70–99)
Potassium: 4.5 mEq/L (ref 3.5–5.1)
Total Bilirubin: 0.5 mg/dL (ref 0.3–1.2)

## 2013-04-07 LAB — GLUCOSE, CAPILLARY: Glucose-Capillary: 154 mg/dL — ABNORMAL HIGH (ref 70–99)

## 2013-04-07 MED ORDER — NITROGLYCERIN IN D5W 200-5 MCG/ML-% IV SOLN
5.0000 ug/min | INTRAVENOUS | Status: DC
  Administered 2013-04-07: 5 ug/min via INTRAVENOUS
  Filled 2013-04-07: qty 250

## 2013-04-07 MED ORDER — FUROSEMIDE 10 MG/ML IJ SOLN
40.0000 mg | Freq: Two times a day (BID) | INTRAMUSCULAR | Status: DC
  Administered 2013-04-07 – 2013-04-09 (×4): 40 mg via INTRAVENOUS
  Filled 2013-04-07 (×6): qty 4

## 2013-04-07 NOTE — ED Notes (Signed)
Patient transported to X-ray 

## 2013-04-07 NOTE — ED Notes (Signed)
PT started having chest pressure around 1415 today and patient has taken 4 nitro without relief

## 2013-04-07 NOTE — ED Notes (Signed)
Patient transported to CT 

## 2013-04-07 NOTE — H&P (Signed)
ADMISSION HISTORY AND PHYSICAL   Date: 04/07/2013               Patient Name:  Aviyon Hocevar MRN: [id number]  DOB: 11-10-1926 Age / Sex: 79 y.o., male        PCP: Bufford Spikes Primary Cardiologist: Juanito Doom, MD         History of Present Illness Patient is a 79 y.o. male with a PMHx of CAD, CABG, AVR, anemia, CKD, who was admitted to [facility] on 04/07/2013 for evaluation of CP and dyspnea.     [name] presents with abdominal pain and chest pain since 2:30 this afternoon.  No N/V/D.  Took 3 SL NTG without relief.    He had just finished preparing food in the kitchen.    He states that he still have a "stiff board" sensation in his chest.   He is not eating any extra salt. Taking all his medications.    He walks regularly during the day.  He typically goes 4-5 times a day for 10 minutes each time.   He went walking this am and has  Not had any trouble.  He is still short of breath.    Medications: Outpatient medications:  (Not in a hospital admission)  No Known Allergies   Past Medical History  Diagnosis Date  . History of colon cancer     s/p colon resection  . Aortic stenosis     a. s/p AVR with 23mm Edwards pericardial valve 02/07/12 - post-op course complicated by pleural effusion requring thoracentesis/leg cellulitis 03/2012.  Marland Kitchen CAD (coronary artery disease)     a. s/p NSTEMI 12/2011:  LHC - Ostial left main 20%, ostial LAD 50%, mid 60-70%, ostial D1 40% and mid 40%, D2 70%, ostial circumflex occluded, ostial RCA 80-90%, LVEDP was 42. b.  s/p CABG x 3 at time of AVR (LiMA-LAD, SVG-2nd daigonal, SVG-PDA) 02/07/12 (post-op course noted above).  . Ischemic cardiomyopathy   . Chronic systolic heart failure     a. TEE 01/2012: EF 25-30%, diffuse hypokinesis. b. Not on ACEI due to renal insufficiency.;  c. follow up  echo 08/06/12: EF 25%, mod diast dysfxn, AVR ok, mild MR, mod LAE, mild RAE, mild to mod RV systolic dysfunction  . HLD (hyperlipidemia)   . GERD (gastroesophageal  reflux disease)   . Chronic kidney disease   . Pleural effusion     a. post-operatively after AVR/CABG s/p thoracentesis 03/2012 yielding 1L serosanguinous fluid.  . Diabetes mellitus     borderline  . Blood transfusion     NO REACTION TO TRANSFUSION  . Cellulitis     a. RLE cellulitis 2 months post-operatively after AVR/CABG - serratia marcessans, tx with I&D/antibiotics  . Mitral regurgitation     Moderate by TEE 01/2012  . Flash pulmonary edema     Post-cath 12/2011, went into acute pulm edema requiring IV lasix and intubation  . Baker's cyst 07/23/12    Incidental finding of LE venous dopplers  . Cataract     right eye, hx of  . HTN (hypertension)     primary, Dr. Dorris Fetch  . Myocardial infarction 12/2011  . CHF (congestive heart failure)   . Stroke 10/01    RIght Leg weakness  . Cancer '90's    Colon  . Anemia     Past Surgical History  Procedure Laterality Date  . Colon resection  1996  . Esophageal dilation    . Colonoscopy    .  Cataract extraction      rt  . Cardiac catheterization      1.3.13  stopped breathing, put on ventilator for 5-6 days  . Tonsillectomy    . Aortic valve replacement  02/07/2012    Procedure: AORTIC VALVE REPLACEMENT (AVR);  Surgeon: [name], MD;  Location: [facility] OR;  Service: Open Heart Surgery;  Laterality: N/A;  . Coronary artery bypass graft  02/07/2012    Procedure: CORONARY ARTERY BYPASS GRAFTING (CABG);  Surgeon: [name], MD;  Location: [facility] OR;  Service: Open Heart Surgery;  Laterality: N/A;  CABG x three; using right leg greater saphenous vein harvested endoscopically  . Chest tube insertion  07/24/2012    Procedure: INSERTION PLEURAL DRAINAGE CATHETER;  Surgeon: [name], MD;  Location: [facility] OR;  Service: Thoracic;  Laterality: Left;  . Talc pleurodesis  08/30/2012    Procedure: TALC PLEURADESIS;  Surgeon: [name], MD;  Location: [facility] OR;  Service: Thoracic;  Laterality: Left;  INSERTION OF TALC VIA LEFT PLEURX  .  Talc pleurodesis  09/12/2012    Procedure: TALC PLEURADESIS;  Surgeon: [name], MD;  Location[facility] OR;  Service: Thoracic;  Laterality: Left;  . Removal of pleural drainage catheter  09/27/2012    Procedure: REMOVAL OF PLEURAL DRAINAGE CATHETER;  Surgeon: [name], MD;  Location: [facility] OR;  Service: Thoracic;  Laterality: Left;  Marland Kitchen Eye surgery  2009    cataract removed from right eye    Family History  Problem Relation Age of Onset  . Cancer Mother   . Heart attack Father   . Mental illness Brother   . Kidney failure Brother     Social History:  reports that he has never smoked. He has never used smokeless tobacco. He reports that he does not drink alcohol or use illicit drugs.   Review of Systems: Constitutional:  denies fever, chills, diaphoresis, appetite change and fatigue.  HEENT: denies photophobia, eye pain, redness, hearing loss, ear pain, congestion, sore throat, rhinorrhea, sneezing, neck pain, neck stiffness and tinnitus.  Respiratory: denies SOB, DOE, cough, chest tightness, and wheezing.  Cardiovascular: admits to chest pain, palpitations and leg swelling starting today  Gastrointestinal: denies nausea, vomiting, abdominal pain, diarrhea, constipation, blood in stool.  Genitourinary: denies dysuria, urgency, frequency, hematuria, flank pain and difficulty urinating.  Musculoskeletal: denies  myalgias, back pain, joint swelling, arthralgias and gait problem.   Skin: denies pallor, rash and wound.  Neurological: denies dizziness, seizures, syncope, weakness, light-headedness, numbness and headaches.   Hematological: denies adenopathy, easy bruising, personal or family bleeding history.  Psychiatric/ Behavioral: denies suicidal ideation, mood changes, confusion, nervousness, sleep disturbance and agitation.    Physical Exam: BP 179/72  Pulse 65  Temp(Src) 98 F (36.7 C) (Oral)  Resp 28  SpO2 96%  General: Vital signs reviewed and noted. Well-developed,  well-nourished, in no acute distress; alert, appropriate and cooperative throughout examination.  Head: Normocephalic, atraumatic, sclera anicteric, mucus membranes are moist  Neck: Supple. Negative for carotid bruits. JVD not elevated.  Lungs:  Bibasilar rales   Heart: RRR with normal S1, S2, soft systolic murmur to ULSB, 3/6 systolic murmur to axilla,   Abdomen:  Soft,mildly tender , mildly distended.  + BS  MSK: Strength and the appear normal for age.  Extremities: No clubbing or cyanosis. Trace - 1 + pitting   Neurologic: Alert and oriented X 3. Moves all extremities spontaneously  Psych:  Responds to questions appropriately with a normal  affect.    Lab results: Basic Metabolic Panel:  Recent Labs Lab 04/07/13 1702  NA 140  K 4.5  CL 99  CO2 32  GLUCOSE 150*  BUN 42*  CREATININE 2.27*  CALCIUM 10.3    Liver Function Tests:  Recent Labs Lab 04/07/13 1702  AST 19  ALT 16  ALKPHOS 109  BILITOT 0.5  PROT 7.9  ALBUMIN 3.5   No results found for this basename: LIPASE, AMYLASE,  in the last 168 hours  CBC:  Recent Labs Lab 04/02/13 1407 04/07/13 1702  WBC 4.9 5.9  NEUTROABS 3.0 3.5  HGB 10.8* 12.0*  HCT 32.7* 35.7*  MCV 90.2 88.1  PLT 114* 134*    Cardiac Enzymes: No results found for this basename: CKTOTAL, CKMB, CKMBINDEX, TROPONINI,  in the last 168 hours  BNP: No components found with this basename: POCBNP,   CBG:  Recent Labs Lab 04/07/13 1701  GLUCAP 154*    Coagulation Studies: No results found for this basename: LABPROT, INR,  in the last 72 hours   Other results:  EKG:  NSR, LVH,    Imaging: Dg Chest Port 1 View  04/07/2013  *RADIOLOGY REPORT*  Clinical Data: Abdominal pain.  Chest pain.  PORTABLE CHEST - 1 VIEW  Comparison: 01/30/2013.  Findings: Cardiomegaly.  Median sternotomy / CABG.  Pulmonary vascular congestion is present.  Interstitial pulmonary edema present with Charyl Dancer B lines at the periphery, most notable at the right  base.  No pleural effusion is identified.  Mild prostatic aortic valve.  IMPRESSION: Mild CHF with cardiomegaly and interstitial pulmonary edema.   Original Report Authenticated By: Andreas Newport, M.D.        Assessment & Plan: 1. CHF:  Mr. Larin presents with abdominal pain, chest pain and dyspnea.  He is volume overloaded on exam.  His ECG does not look acute and I think his presentation is more CHF than acute coronary syndrome.   He had no relief with SL NTG.   Will give him IV Lasix - 40 mg IV BID  to start.  I would increase to 80 IV BID if needed.  Will get an echo.   Check cardiac enzymes.    In the setting of CKD, the elevated pro-BNP is less diagnostic.   2.  CKD:  Stable  3. Anemia:  4. Aortic stenosis  5. Mitral regurgitation:   Alvia Grove., MD, [facility] 04/07/2013, 7:30 PM

## 2013-04-07 NOTE — ED Notes (Addendum)
Patient is having chest pain 8/10 and shortness of breath. Started him on his ordered nitro gtt and placed him on 2L [place].

## 2013-04-07 NOTE — ED Provider Notes (Signed)
History     CSN: [id number]  Arrival date & time 04/07/13  1648   First MD Initiated Contact with [name] [name] 04/07/13 1712      Chief Complaint  [name] [name] presents with  . Chest Pain    (Consider location/radiation/quality/duration/timing/severity/associated sxs/prior treatment) [name] [name] is a 79 y.o. male presenting with chest pain. The history is provided by the [name] [name] and the spouse.  Chest Pain Pain location:  Substernal area Associated symptoms: shortness of breath   Associated symptoms: no abdominal pain, no back pain, no headache, no nausea, no numbness, not vomiting and no weakness   [name] [name] had chest pain and shortness of breath began this evening. He feels somewhat like his previous angina and has been unrelieved by nitroglycerin. He's a previous coronary artery bypass and aortic valve replacement. No fevers. No cough. No lightheadedness or dizziness. No nausea vomiting or diarrhea.[name] [name] is a history of CHF, however his weights have been good and he does not have swelling in his legs.  Past Medical History  Diagnosis Date  . History of colon cancer     s/p colon resection  . Aortic stenosis     a. s/p AVR with 23mm Edwards pericardial valve 02/07/12 - post-op course complicated by pleural effusion requring thoracentesis/leg cellulitis 03/2012.  Marland Kitchen CAD (coronary artery disease)     a. s/p NSTEMI 12/2011:  LHC - Ostial left main 20%, ostial LAD 50%, mid 60-70%, ostial D1 40% and mid 40%, D2 70%, ostial circumflex occluded, ostial RCA 80-90%, LVEDP was 42. b.  s/p CABG x 3 at time of AVR (LiMA-LAD, SVG-2nd daigonal, SVG-PDA) 02/07/12 (post-op course noted above).  . Ischemic cardiomyopathy   . Chronic systolic heart failure     a. TEE 01/2012: EF 25-30%, diffuse hypokinesis. b. Not on ACEI due to renal insufficiency.;  c. follow up  echo 08/06/12: EF 25%, mod diast dysfxn, AVR ok, mild MR, mod LAE, mild RAE, mild to mod RV systolic dysfunction  . HLD (hyperlipidemia)   . GERD  (gastroesophageal reflux disease)   . Chronic kidney disease   . Pleural effusion     a. post-operatively after AVR/CABG s/p thoracentesis 03/2012 yielding 1L serosanguinous fluid.  . Diabetes mellitus     borderline  . Blood transfusion     NO REACTION TO TRANSFUSION  . Cellulitis     a. RLE cellulitis 2 months post-operatively after AVR/CABG - serratia marcessans, tx with I&D/antibiotics  . Mitral regurgitation     Moderate by TEE 01/2012  . Flash pulmonary edema     Post-cath 12/2011, went into acute pulm edema requiring IV lasix and intubation  . Baker's cyst 07/23/12    Incidental finding of LE venous dopplers  . Cataract     right eye, hx of  . HTN (hypertension)     primary, Dr. Dorris Fetch  . Myocardial infarction 12/2011  . CHF (congestive heart failure)   . Stroke 10/01    RIght Leg weakness  . Cancer '90's    Colon  . Anemia     Past Surgical History  Procedure Laterality Date  . Colon resection  1996  . Esophageal dilation    . Colonoscopy    . Cataract extraction      rt  . Cardiac catheterization      1.3.13  stopped breathing, put on ventilator for 5-6 days  . Tonsillectomy    . Aortic valve replacement  02/07/2012    Procedure: AORTIC VALVE REPLACEMENT (AVR);  Surgeon: Payton Doughty  Laneta Simmers, MD;  Location: [facility] OR;  Service: Open Heart Surgery;  Laterality: N/A;  . Coronary artery bypass graft  02/07/2012    Procedure: CORONARY ARTERY BYPASS GRAFTING (CABG);  Surgeon: [name], MD;  Location: [facility] OR;  Service: Open Heart Surgery;  Laterality: N/A;  CABG x three; using right leg greater saphenous vein harvested endoscopically  . Chest tube insertion  07/24/2012    Procedure: INSERTION PLEURAL DRAINAGE CATHETER;  Surgeon: [name], MD;  Location: [facility] OR;  Service: Thoracic;  Laterality: Left;  . Talc pleurodesis  08/30/2012    Procedure: TALC PLEURADESIS;  Surgeon: [name], MD;  Location: [facility] OR;  Service: Thoracic;  Laterality: Left;  INSERTION OF TALC VIA  LEFT PLEURX  . Talc pleurodesis  09/12/2012    Procedure: TALC PLEURADESIS;  Surgeon: [name], MD;  Location: [facility] OR;  Service: Thoracic;  Laterality: Left;  . Removal of pleural drainage catheter  09/27/2012    Procedure: REMOVAL OF PLEURAL DRAINAGE CATHETER;  Surgeon: [name], MD;  Location: [facility] OR;  Service: Thoracic;  Laterality: Left;  Marland Kitchen Eye surgery  2009    cataract removed from right eye    Family History  Problem Relation Age of Onset  . Cancer Mother   . Heart attack Father   . Mental illness Brother   . Kidney failure Brother     History  Substance Use Topics  . Smoking status: Never Smoker   . Smokeless tobacco: Never Used  . Alcohol Use: No      Review of Systems  Constitutional: Negative for activity change and appetite change.  HENT: Negative for neck stiffness.   Eyes: Negative for pain.  Respiratory: Positive for shortness of breath. Negative for chest tightness.   Cardiovascular: Positive for chest pain. Negative for leg swelling.  Gastrointestinal: Negative for nausea, vomiting, abdominal pain and diarrhea.  Genitourinary: Negative for flank pain.  Musculoskeletal: Negative for back pain.  Skin: Negative for rash.  Neurological: Negative for weakness, numbness and headaches.  Psychiatric/Behavioral: Negative for behavioral problems.    Allergies  Review of [name] [name]'s allergies indicates no known allergies.  Home Medications   Current Outpatient Rx  Name  Route  Sig  Dispense  Refill  . aspirin EC 325 MG tablet   Oral   Take 325 mg by mouth every morning.          . carvedilol (COREG) 25 MG tablet   Oral   Take 1 tablet (25 mg total) by mouth 2 (two) times daily with a meal.   60 tablet   5   . cholecalciferol (VITAMIN D) 1000 UNITS tablet   Oral   Take 1,000 Units by mouth every morning.          Marland Kitchen epoetin alfa (EPOGEN,PROCRIT) 2000 UNIT/ML injection   Subcutaneous   Inject 2,000 Units into the skin every 21 ( twenty-one)  days. If hemoglobin <10         . furosemide (LASIX) 40 MG tablet   Oral   Take 40 mg by mouth 2 (two) times daily.          . hydrALAZINE (APRESOLINE) 25 MG tablet   Oral   Take 25 mg by mouth 3 (three) times daily.         . isosorbide mononitrate (IMDUR) 30 MG 24 hr tablet   Oral   Take 30 mg by mouth every morning.         Marland Kitchen  Multiple Vitamin (MULTIVITAMIN) capsule   Oral   Take 1 capsule by mouth every morning.          . nitroGLYCERIN (NITROSTAT) 0.4 MG SL tablet   Sublingual   Place 1 tablet (0.4 mg total) under the tongue every 5 (five) minutes x 3 doses as needed. For chest pain   25 tablet   5   . omeprazole (PRILOSEC) 20 MG capsule   Oral   Take 20 mg by mouth every morning.          . Prenatal Vit-Fe Fumarate-FA (MULTIVITAMIN-PRENATAL) 27-0.8 MG TABS   Oral   Take 1 tablet by mouth every evening.         . Tamsulosin HCl (FLOMAX) 0.4 MG CAPS   Oral   Take 1 capsule (0.4 mg total) by mouth daily after supper.   30 capsule   3   . Travoprost, BAK Free, (TRAVATAN) 0.004 % SOLN ophthalmic solution   Both Eyes   Place 1 drop into both eyes 2 (two) times daily.           BP 179/72  Pulse 65  Temp(Src) 98 F (36.7 C) (Oral)  Resp 28  SpO2 96%  Physical Exam  Nursing note and vitals reviewed. Constitutional: He is oriented to person, place, and time. He appears well-developed and well-nourished.  [name] [name] appears somewhat uncomfortable  HENT:  Head: Normocephalic and atraumatic.  Eyes: EOM are normal. Pupils are equal, round, and reactive to light.  Neck: Normal range of motion. Neck supple.  Cardiovascular: Normal rate, regular rhythm and normal heart sounds.   No murmur heard. Pulmonary/Chest: Effort normal and breath sounds normal.  Abdominal: Soft. Bowel sounds are normal. He exhibits no distension and no mass. There is no tenderness. There is no rebound and no guarding.  Musculoskeletal: Normal range of motion. He exhibits no  edema.  Neurological: He is alert and oriented to person, place, and time. No cranial nerve deficit.  Skin: Skin is warm and dry.  Psychiatric: He has a normal mood and affect.    ED Course  Procedures (including critical care time)  Labs Reviewed  PRO B NATRIURETIC PEPTIDE - Abnormal; Notable for the following:    Pro B Natriuretic peptide (BNP) 16363.0 (*)    All other components within normal limits  CBC WITH DIFFERENTIAL - Abnormal; Notable for the following:    RBC 4.05 (*)    Hemoglobin 12.0 (*)    HCT 35.7 (*)    RDW 17.5 (*)    Platelets 134 (*)    All other components within normal limits  COMPREHENSIVE METABOLIC PANEL - Abnormal; Notable for the following:    Glucose, Bld 150 (*)    BUN 42 (*)    Creatinine, Ser 2.27 (*)    GFR calc non Af Amer 25 (*)    GFR calc Af Amer 29 (*)    All other components within normal limits  GLUCOSE, CAPILLARY - Abnormal; Notable for the following:    Glucose-Capillary 154 (*)    All other components within normal limits  POCT I-STAT TROPONIN I   Dg Chest Port 1 View  04/07/2013  *RADIOLOGY REPORT*  Clinical Data: Abdominal pain.  Chest pain.  PORTABLE CHEST - 1 VIEW  Comparison: 01/30/2013.  Findings: Cardiomegaly.  Median sternotomy / CABG.  Pulmonary vascular congestion is present.  Interstitial pulmonary edema present with Charyl Dancer B lines at the periphery, most notable at the right base.  No pleural effusion is  identified.  Mild prostatic aortic valve.  IMPRESSION: Mild CHF with cardiomegaly and interstitial pulmonary edema.   Original Report Authenticated By: Andreas Newport, M.D.     Date: 04/07/2013  Rate: 57  Rhythm: sinus bradycardia  QRS Axis: normal  Intervals: normal  ST/T Wave abnormalities: nonspecific ST/T changes  Conduction Disutrbances:nonspecific intraventricular conduction delay  Narrative Interpretation: nonspecific ST and T wave changes. LVH  Old EKG Reviewed: changes noted   Date: 04/07/2013  Rate: 59   Rhythm: sinus bradycardia  QRS Axis: normal  Intervals: normal  ST/T Wave abnormalities: nonspecific ST/T changes  Conduction Disutrbances:nonspecific intraventricular conduction delay  Narrative Interpretation: LVH  Old EKG Reviewed: unchanged    1. CHF (congestive heart failure)   2. Chest pain       MDM  [name] [name] presents with chest pain shortness of breath. X-ray shows CHF and BNP is elevated. EKG is reassuring. [name] [name] be admitted to cardiology.        Juliet Rude. Rubin Payor, MD 04/07/13 4540

## 2013-04-08 ENCOUNTER — Encounter (HOSPITAL_COMMUNITY): Payer: Self-pay | Admitting: *Deleted

## 2013-04-08 DIAGNOSIS — I059 Rheumatic mitral valve disease, unspecified: Secondary | ICD-10-CM

## 2013-04-08 LAB — CBC
HCT: 35.4 % — ABNORMAL LOW (ref 39.0–52.0)
Hemoglobin: 11.8 g/dL — ABNORMAL LOW (ref 13.0–17.0)
Hemoglobin: 11.7 g/dL — ABNORMAL LOW (ref 13.0–17.0)
MCH: 29.4 pg (ref 26.0–34.0)
MCH: 29.3 pg (ref 26.0–34.0)
MCHC: 33.3 g/dL (ref 30.0–36.0)
MCHC: 33.1 g/dL (ref 30.0–36.0)
MCV: 88.3 fL (ref 78.0–100.0)
RBC: 4.01 MIL/uL — ABNORMAL LOW (ref 4.22–5.81)
RBC: 4 MIL/uL — ABNORMAL LOW (ref 4.22–5.81)

## 2013-04-08 LAB — TROPONIN I: Troponin I: <0.3 ng/mL (ref <0.30)

## 2013-04-08 LAB — BASIC METABOLIC PANEL
BUN: 42 mg/dL — ABNORMAL HIGH (ref 6–23)
CO2: 31 mEq/L (ref 19–32)
Calcium: 9.4 mg/dL (ref 8.4–10.5)
GFR calc non Af Amer: 26 mL/min — ABNORMAL LOW (ref >90)
Glucose, Bld: 185 mg/dL — ABNORMAL HIGH (ref 70–99)
Potassium: 4.1 mEq/L (ref 3.5–5.1)
Sodium: 138 mEq/L (ref 135–145)

## 2013-04-08 LAB — MRSA PCR SCREENING: MRSA by PCR: NEGATIVE

## 2013-04-08 MED ORDER — SODIUM CHLORIDE 0.9 % IJ SOLN
3.0000 mL | Freq: Two times a day (BID) | INTRAMUSCULAR | Status: DC
  Administered 2013-04-08 – 2013-04-10 (×5): 3 mL via INTRAVENOUS

## 2013-04-08 MED ORDER — HYDRALAZINE HCL 25 MG PO TABS
25.0000 mg | ORAL_TABLET | Freq: Three times a day (TID) | ORAL | Status: DC
  Administered 2013-04-08 (×4): 25 mg via ORAL
  Filled 2013-04-08 (×7): qty 1

## 2013-04-08 MED ORDER — ASPIRIN 81 MG PO CHEW
324.0000 mg | CHEWABLE_TABLET | ORAL | Status: AC
  Administered 2013-04-08: 324 mg via ORAL
  Filled 2013-04-08: qty 4

## 2013-04-08 MED ORDER — CARVEDILOL 25 MG PO TABS
25.0000 mg | ORAL_TABLET | Freq: Two times a day (BID) | ORAL | Status: DC
  Administered 2013-04-08 – 2013-04-09 (×3): 25 mg via ORAL
  Filled 2013-04-08 (×6): qty 1

## 2013-04-08 MED ORDER — MORPHINE SULFATE 2 MG/ML IJ SOLN
1.0000 mg | INTRAMUSCULAR | Status: DC | PRN
  Administered 2013-04-08 – 2013-04-10 (×2): 1 mg via INTRAVENOUS
  Filled 2013-04-08 (×2): qty 1

## 2013-04-08 MED ORDER — SODIUM CHLORIDE 0.9 % IV SOLN
250.0000 mL | INTRAVENOUS | Status: DC | PRN
  Administered 2013-04-08 – 2013-04-09 (×2): 250 mL via INTRAVENOUS

## 2013-04-08 MED ORDER — SODIUM CHLORIDE 0.9 % IJ SOLN
3.0000 mL | INTRAMUSCULAR | Status: DC | PRN
  Administered 2013-04-10: 3 mL via INTRAVENOUS

## 2013-04-08 MED ORDER — TAMSULOSIN HCL 0.4 MG PO CAPS
0.4000 mg | ORAL_CAPSULE | Freq: Every day | ORAL | Status: DC
  Administered 2013-04-08 – 2013-04-14 (×7): 0.4 mg via ORAL
  Filled 2013-04-08 (×10): qty 1

## 2013-04-08 MED ORDER — PANTOPRAZOLE SODIUM 40 MG PO TBEC
40.0000 mg | DELAYED_RELEASE_TABLET | Freq: Every day | ORAL | Status: DC
  Administered 2013-04-08 – 2013-04-15 (×8): 40 mg via ORAL
  Filled 2013-04-08 (×8): qty 1

## 2013-04-08 MED ORDER — ONDANSETRON HCL 4 MG/2ML IJ SOLN: 4.0000 mg | Freq: Four times a day (QID) | INTRAMUSCULAR | Status: DC | PRN

## 2013-04-08 MED ORDER — ADULT MULTIVITAMIN W/MINERALS CH
1.0000 | ORAL_TABLET | Freq: Every day | ORAL | Status: DC
  Administered 2013-04-08 – 2013-04-09 (×2): 1 via ORAL
  Filled 2013-04-08 (×2): qty 1

## 2013-04-08 MED ORDER — TRAVOPROST (BAK FREE) 0.004 % OP SOLN
1.0000 [drp] | Freq: Two times a day (BID) | OPHTHALMIC | Status: DC
  Administered 2013-04-08 – 2013-04-15 (×15): 1 [drp] via OPHTHALMIC
  Filled 2013-04-08 (×3): qty 2.5

## 2013-04-08 MED ORDER — NITROGLYCERIN IN D5W 200-5 MCG/ML-% IV SOLN: 5.0000 ug/min | INTRAVENOUS | Status: DC

## 2013-04-08 MED ORDER — ACETAMINOPHEN 325 MG PO TABS
650.0000 mg | ORAL_TABLET | ORAL | Status: DC | PRN
  Administered 2013-04-08 – 2013-04-13 (×4): 650 mg via ORAL
  Filled 2013-04-08 (×3): qty 2

## 2013-04-08 MED ORDER — VITAMIN D3 25 MCG (1000 UNIT) PO TABS
1000.0000 [IU] | ORAL_TABLET | Freq: Every morning | ORAL | Status: DC
  Administered 2013-04-08 – 2013-04-15 (×8): 1000 [IU] via ORAL
  Filled 2013-04-08 (×8): qty 1

## 2013-04-08 MED ORDER — HEPARIN SODIUM (PORCINE) 5000 UNIT/ML IJ SOLN
5000.0000 [IU] | Freq: Three times a day (TID) | INTRAMUSCULAR | Status: DC
  Administered 2013-04-08 – 2013-04-10 (×9): 5000 [IU] via SUBCUTANEOUS
  Filled 2013-04-08 (×11): qty 1

## 2013-04-08 MED ORDER — NITROGLYCERIN 0.4 MG SL SUBL: 0.4000 mg | SUBLINGUAL_TABLET | SUBLINGUAL | Status: DC | PRN

## 2013-04-08 MED ORDER — ISOSORBIDE MONONITRATE ER 60 MG PO TB24
60.0000 mg | ORAL_TABLET | Freq: Every day | ORAL | Status: DC
  Administered 2013-04-08 – 2013-04-15 (×8): 60 mg via ORAL
  Filled 2013-04-08 (×9): qty 1

## 2013-04-08 MED ORDER — ASPIRIN EC 325 MG PO TBEC
325.0000 mg | DELAYED_RELEASE_TABLET | Freq: Every morning | ORAL | Status: DC
  Administered 2013-04-08 – 2013-04-15 (×8): 325 mg via ORAL
  Filled 2013-04-08 (×8): qty 1

## 2013-04-08 NOTE — Progress Notes (Signed)
  Echocardiogram 2D Echocardiogram has been performed.  Mart Colpitts FRANCES 04/08/2013, 11:55 AM

## 2013-04-08 NOTE — Progress Notes (Signed)
0000 Rec'd pt from ED on monitor. Pt c/o CP 9/10 Assisted pt over into bed.  0020 Pt on 2L [place] for Sats 91% Call placed to Dr. Antoine Poche re: Unreleived persistent midsternal chest pressure Questions answered.  4540 New orders received.

## 2013-04-08 NOTE — Progress Notes (Signed)
Patient: Thane Age / Admit Date: 04/07/2013 / Date of Encounter: 04/08/2013, 7:45 AM   Subjective  Slept well. Feels much better. Chest pressure is completely gone. SOB improved.   Objective   Telemetry: NSR Physical Exam: Filed Vitals:   04/08/13 0600  BP: 145/52  Pulse: 63  Temp:   Resp: 24  Temp 98.7, POx 98% [place] General: Well developed, well nourished, in no acute distress. Head: Normocephalic, atraumatic, sclera non-icteric, no xanthomas, nares are without discharge. Neck: Negative for carotid bruits. JVD not elevated. Lungs: Coarse BS bilaterally. Breathing is unlabored. Heart: RRR S1 S2 without murmurs, rubs, or gallops.  Abdomen: Soft, non-tender, non-distended with normoactive bowel sounds. No hepatomegaly. No rebound/guarding. No obvious abdominal masses. Msk:  Strength and tone appear normal for age. Extremities: No clubbing or cyanosis. No edema.  Distal pedal pulses are 2+ and equal bilaterally. Neuro: Alert and oriented X 3. Moves all extremities spontaneously. Psych:  Responds to questions appropriately with a normal affect.    Intake/Output Summary (Last 24 hours) at 04/08/13 0745 Last data filed at 04/08/13 0443  Gross per 24 hour  Intake 212.63 ml  Output   1150 ml  Net -937.37 ml    Inpatient Medications:  . aspirin EC  325 mg Oral q morning - 10a  . carvedilol  25 mg Oral BID WC  . cholecalciferol  1,000 Units Oral q morning - 10a  . furosemide  40 mg Intravenous BID  . heparin  5,000 Units Subcutaneous Q8H  . hydrALAZINE  25 mg Oral TID  . multivitamin with minerals  1 tablet Oral Daily  . pantoprazole  40 mg Oral Daily  . sodium chloride  3 mL Intravenous Q12H  . tamsulosin  0.4 mg Oral QPC supper  . Travoprost (BAK Free)  1 drop Both Eyes BID    Labs:  Recent Labs  04/07/13 1702 04/08/13 0114  NA 140  --   K 4.5  --   CL 99  --   CO2 32  --   GLUCOSE 150*  --   BUN 42*  --   CREATININE 2.27* 2.19*  CALCIUM 10.3  --      Recent Labs  04/07/13 1702  AST 19  ALT 16  ALKPHOS 109  BILITOT 0.5  PROT 7.9  ALBUMIN 3.5    Recent Labs  04/07/13 1702 04/08/13 0114 04/08/13 0655  WBC 5.9 9.5 11.6*  NEUTROABS 3.5  --   --   HGB 12.0* 11.8* 11.7*  HCT 35.7* 35.4* 35.4*  MCV 88.1 88.3 88.5  PLT 134* 123* 129*    Recent Labs  04/08/13 0114  TROPONINI <0.30    Radiology/Studies:  Dg Chest Port 1 View 04/07/2013  *RADIOLOGY REPORT*  Clinical Data: Abdominal pain.  Chest pain.  PORTABLE CHEST - 1 VIEW  Comparison: 01/30/2013.  Findings: Cardiomegaly.  Median sternotomy / CABG.  Pulmonary vascular congestion is present.  Interstitial pulmonary edema present with Charyl Dancer B lines at the periphery, most notable at the right base.  No pleural effusion is identified.  Mild prostatic aortic valve.  IMPRESSION: Mild CHF with cardiomegaly and interstitial pulmonary edema.   Original Report Authenticated By: Andreas Newport, M.D.      Assessment and Plan  1. Acute on chronic systolic CHF/ICM EF 25% - Will ask nursing to enter weight for today. Not on ACEI/ARB due to #3. Suspect related to accelerated HTN. Dr. Daleen Squibb to decide regarding further diuresis plans. 2D echo pending. 2. CP with history  of CAD s/p CABG 01/2012 - suspect chest pressure was related to volume overload. Enzymes negative thus far. Con't ASA, BB. Statin intolerant per Dr. Vern Claude office notes. 3. CKD  - Cr stable with IV Lasix overnight. 4. HTN - pressures improving, on IV NTG. Consider converting back to Imdur. 5. Aortic stenosis s/p pericardial AVR 01/2012 - echo pending. 5. MR, moderate by TEE 01/2012 - await repeat echo. 6. Anemia/thrombocytopenia - stable.  Signed, Ronie Spies PA-C Patient examined and agree except changes made. Dry weight at home is 138 on home scales. His window will be tightened to 137-137. Discussed with he and his wife who understand how to titrate his diuretic.  Valera Castle, MD 04/08/2013 9:15 AM

## 2013-04-09 ENCOUNTER — Inpatient Hospital Stay (HOSPITAL_COMMUNITY): Payer: Medicare Other

## 2013-04-09 DIAGNOSIS — I5022 Chronic systolic (congestive) heart failure: Secondary | ICD-10-CM

## 2013-04-09 DIAGNOSIS — D649 Anemia, unspecified: Secondary | ICD-10-CM

## 2013-04-09 DIAGNOSIS — R6520 Severe sepsis without septic shock: Secondary | ICD-10-CM

## 2013-04-09 DIAGNOSIS — I959 Hypotension, unspecified: Secondary | ICD-10-CM

## 2013-04-09 DIAGNOSIS — A419 Sepsis, unspecified organism: Secondary | ICD-10-CM

## 2013-04-09 DIAGNOSIS — N179 Acute kidney failure, unspecified: Secondary | ICD-10-CM

## 2013-04-09 DIAGNOSIS — J90 Pleural effusion, not elsewhere classified: Secondary | ICD-10-CM

## 2013-04-09 LAB — URINE MICROSCOPIC-ADD ON

## 2013-04-09 LAB — CBC WITH DIFFERENTIAL/PLATELET
Basophils Relative: 0 % (ref 0–1)
Eosinophils Relative: 0 % (ref 0–5)
Hemoglobin: 10.2 g/dL — ABNORMAL LOW (ref 13.0–17.0)
Lymphocytes Relative: 5 % — ABNORMAL LOW (ref 12–46)
MCH: 29.7 pg (ref 26.0–34.0)
Monocytes Absolute: 0.5 10*3/uL (ref 0.1–1.0)
Monocytes Relative: 3 % (ref 3–12)
Neutrophils Relative %: 92 % — ABNORMAL HIGH (ref 43–77)
Platelets: 81 10*3/uL — ABNORMAL LOW (ref 150–400)
RBC: 3.44 MIL/uL — ABNORMAL LOW (ref 4.22–5.81)
WBC: 15 10*3/uL — ABNORMAL HIGH (ref 4.0–10.5)

## 2013-04-09 LAB — URINALYSIS, ROUTINE W REFLEX MICROSCOPIC
Glucose, UA: NEGATIVE mg/dL
Glucose, UA: NEGATIVE mg/dL
Hgb urine dipstick: NEGATIVE
Ketones, ur: 15 mg/dL — AB
Ketones, ur: 15 mg/dL — AB
Protein, ur: NEGATIVE mg/dL
Urobilinogen, UA: 1 mg/dL (ref 0.0–1.0)
pH: 5 (ref 5.0–8.0)

## 2013-04-09 LAB — BASIC METABOLIC PANEL
GFR calc non Af Amer: 16 mL/min — ABNORMAL LOW (ref >90)
Glucose, Bld: 127 mg/dL — ABNORMAL HIGH (ref 70–99)
Potassium: 3.5 mEq/L (ref 3.5–5.1)
Sodium: 135 mEq/L (ref 135–145)

## 2013-04-09 LAB — GLUCOSE, CAPILLARY: Glucose-Capillary: 146 mg/dL — ABNORMAL HIGH (ref 70–99)

## 2013-04-09 LAB — STREP PNEUMONIAE URINARY ANTIGEN: Strep Pneumo Urinary Antigen: NEGATIVE

## 2013-04-09 MED ORDER — SODIUM CHLORIDE 0.9 % IV BOLUS (SEPSIS)
500.0000 mL | Freq: Once | INTRAVENOUS | Status: AC
  Administered 2013-04-09: 500 mL via INTRAVENOUS

## 2013-04-09 MED ORDER — SODIUM CHLORIDE 0.9 % IV SOLN: 250.0000 mL | INTRAVENOUS | Status: DC | PRN

## 2013-04-09 MED ORDER — LEVOFLOXACIN IN D5W 750 MG/150ML IV SOLN
750.0000 mg | Freq: Once | INTRAVENOUS | Status: AC
  Administered 2013-04-09: 750 mg via INTRAVENOUS
  Filled 2013-04-09 (×3): qty 150

## 2013-04-09 MED ORDER — NOREPINEPHRINE BITARTRATE 1 MG/ML IJ SOLN
2.0000 ug/min | INTRAVENOUS | Status: DC
  Filled 2013-04-09 (×2): qty 4

## 2013-04-09 MED ORDER — PHENYLEPHRINE HCL 10 MG/ML IJ SOLN: 30.0000 ug/min | INTRAVENOUS | Status: DC

## 2013-04-09 MED ORDER — INSULIN ASPART 100 UNIT/ML ~~LOC~~ SOLN: 0.0000 [IU] | Freq: Every day | SUBCUTANEOUS | Status: DC

## 2013-04-09 MED ORDER — ALBUTEROL SULFATE (5 MG/ML) 0.5% IN NEBU
2.5000 mg | INHALATION_SOLUTION | Freq: Four times a day (QID) | RESPIRATORY_TRACT | Status: DC | PRN
  Administered 2013-04-09: 2.5 mg via RESPIRATORY_TRACT
  Filled 2013-04-09: qty 0.5

## 2013-04-09 MED ORDER — PIPERACILLIN-TAZOBACTAM IN DEX 2-0.25 GM/50ML IV SOLN
2.2500 g | Freq: Four times a day (QID) | INTRAVENOUS | Status: DC
  Administered 2013-04-09 – 2013-04-13 (×15): 2.25 g via INTRAVENOUS
  Filled 2013-04-09 (×19): qty 50

## 2013-04-09 MED ORDER — INSULIN ASPART 100 UNIT/ML ~~LOC~~ SOLN
0.0000 [IU] | Freq: Three times a day (TID) | SUBCUTANEOUS | Status: DC
  Administered 2013-04-09 – 2013-04-10 (×2): 3 [IU] via SUBCUTANEOUS
  Administered 2013-04-10: 2 [IU] via SUBCUTANEOUS
  Administered 2013-04-10 – 2013-04-11 (×2): 3 [IU] via SUBCUTANEOUS
  Administered 2013-04-11: 2 [IU] via SUBCUTANEOUS
  Administered 2013-04-12 (×2): 3 [IU] via SUBCUTANEOUS
  Administered 2013-04-12: 2 [IU] via SUBCUTANEOUS
  Administered 2013-04-13: 5 [IU] via SUBCUTANEOUS
  Administered 2013-04-13 (×2): 2 [IU] via SUBCUTANEOUS
  Administered 2013-04-14: 3 [IU] via SUBCUTANEOUS
  Administered 2013-04-14: 14:00:00 via SUBCUTANEOUS
  Administered 2013-04-14 – 2013-04-15 (×2): 3 [IU] via SUBCUTANEOUS
  Administered 2013-04-15: 2 [IU] via SUBCUTANEOUS

## 2013-04-09 MED ORDER — VANCOMYCIN HCL IN DEXTROSE 1-5 GM/200ML-% IV SOLN
1000.0000 mg | INTRAVENOUS | Status: DC
  Administered 2013-04-09: 1000 mg via INTRAVENOUS
  Filled 2013-04-09: qty 200

## 2013-04-09 MED ORDER — LEVOFLOXACIN IN D5W 500 MG/100ML IV SOLN
500.0000 mg | INTRAVENOUS | Status: DC
  Filled 2013-04-09: qty 100

## 2013-04-09 NOTE — Progress Notes (Addendum)
ANTIBIOTIC CONSULT NOTE - INITIAL  Pharmacy Consult for vanc/zosyn Indication: pneumonia  Allergies  Allergen Reactions  . Statins     Pain/weakness in legs    Patient Measurements: Height: 5\' 8"  (172.7 cm) Weight: 147 lb 11.3 oz (67 kg) IBW/kg (Calculated) : 68.4  Vital Signs: Temp: 100.7 F (38.2 C) (04/30 1330) Temp src: Axillary (04/30 1330) BP: 94/39 mmHg (04/30 1146) Pulse Rate: 72 (04/30 1146) Intake/Output from previous day: 04/29 0701 - 04/30 0700 In: 858 [P.O.:840; I.V.:18] Out: 1250 [Urine:1250] Intake/Output from this shift: Total I/O In: 643 [P.O.:310; I.V.:333] Out: 90 [Urine:90]  Labs:  Recent Labs  04/07/13 1702 04/08/13 0114 04/08/13 0655 04/09/13 0420  WBC 5.9 9.5 11.6*  --   HGB 12.0* 11.8* 11.7*  --   PLT 134* 123* 129*  --   CREATININE 2.27* 2.19* 2.18* 3.22*   Estimated Creatinine Clearance: 15.9 ml/min (by C-G formula based on Cr of 3.22). No results found for this basename: VANCOTROUGH, Leodis Binet, VANCORANDOM, GENTTROUGH, GENTPEAK, GENTRANDOM, TOBRATROUGH, TOBRAPEAK, TOBRARND, AMIKACINPEAK, AMIKACINTROU, AMIKACIN,  in the last 72 hours   Microbiology: Recent Results (from the past 720 hour(s))  MRSA PCR SCREENING     Status: None   Collection Time    04/08/13  1:49 AM      Result Value Range Status   MRSA by PCR NEGATIVE  NEGATIVE Final   Comment:            The GeneXpert MRSA Assay (FDA     approved for NASAL specimens     only), is one component of a     comprehensive MRSA colonization     surveillance program. It is not     intended to diagnose MRSA     infection nor to guide or     monitor treatment for     MRSA infections.    Medical History: Past Medical History  Diagnosis Date  . History of colon cancer     s/p colon resection  . Aortic stenosis     a. s/p AVR with 23mm Edwards pericardial valve 02/07/12 - post-op course complicated by pleural effusion requring thoracentesis/leg cellulitis 03/2012.  Marland Kitchen CAD (coronary  artery disease)     a. s/p NSTEMI 12/2011:  LHC - Ostial left main 20%, ostial LAD 50%, mid 60-70%, ostial D1 40% and mid 40%, D2 70%, ostial circumflex occluded, ostial RCA 80-90%, LVEDP was 42. b.  s/p CABG x 3 at time of AVR (LiMA-LAD, SVG-2nd daigonal, SVG-PDA) 02/07/12 (post-op course noted above).  . Ischemic cardiomyopathy   . Chronic systolic heart failure     a. TEE 01/2012: EF 25-30%, diffuse hypokinesis. b. Not on ACEI due to renal insufficiency.;  c. follow up  echo 08/06/12: EF 25%, mod diast dysfxn, AVR ok, mild MR, mod LAE, mild RAE, mild to mod RV systolic dysfunction  . HLD (hyperlipidemia)   . GERD (gastroesophageal reflux disease)   . Chronic kidney disease   . Pleural effusion     a. post-operatively after AVR/CABG s/p thoracentesis 03/2012 yielding 1L serosanguinous fluid.  . Diabetes mellitus     borderline  . Blood transfusion     NO REACTION TO TRANSFUSION  . Cellulitis     a. RLE cellulitis 2 months post-operatively after AVR/CABG - serratia marcessans, tx with I&D/antibiotics  . Mitral regurgitation     Moderate by TEE 01/2012  . Flash pulmonary edema     Post-cath 12/2011, went into acute pulm edema requiring IV lasix and intubation  .  Baker's cyst 07/23/12    Incidental finding of LE venous dopplers  . Cataract     right eye, hx of  . HTN (hypertension)     primary, Dr. Dorris Fetch  . Myocardial infarction 12/2011  . CHF (congestive heart failure)   . Stroke 10/01    RIght Leg weakness  . Cancer '90's    Colon  . Anemia     Medications:  Prescriptions prior to admission  Medication Sig Dispense Refill  . aspirin EC 325 MG tablet Take 325 mg by mouth every morning.       . carvedilol (COREG) 25 MG tablet Take 1 tablet (25 mg total) by mouth 2 (two) times daily with a meal.  60 tablet  5  . cholecalciferol (VITAMIN D) 1000 UNITS tablet Take 1,000 Units by mouth every morning.       Marland Kitchen epoetin alfa (EPOGEN,PROCRIT) 2000 UNIT/ML injection Inject 2,000 Units  into the skin every 21 ( twenty-one) days. If hemoglobin <10      . furosemide (LASIX) 40 MG tablet Take 40 mg by mouth 2 (two) times daily.       . hydrALAZINE (APRESOLINE) 25 MG tablet Take 25 mg by mouth 3 (three) times daily.      . isosorbide mononitrate (IMDUR) 30 MG 24 hr tablet Take 30 mg by mouth every morning.      . Multiple Vitamin (MULTIVITAMIN) capsule Take 1 capsule by mouth every morning.       . nitroGLYCERIN (NITROSTAT) 0.4 MG SL tablet Place 1 tablet (0.4 mg total) under the tongue every 5 (five) minutes x 3 doses as needed. For chest pain  25 tablet  5  . omeprazole (PRILOSEC) 20 MG capsule Take 20 mg by mouth every morning.       . Prenatal Vit-Fe Fumarate-FA (MULTIVITAMIN-PRENATAL) 27-0.8 MG TABS Take 1 tablet by mouth every evening.      . Tamsulosin HCl (FLOMAX) 0.4 MG CAPS Take 1 capsule (0.4 mg total) by mouth daily after supper.  30 capsule  3  . Travoprost, BAK Free, (TRAVATAN) 0.004 % SOLN ophthalmic solution Place 1 drop into both eyes 2 (two) times daily.       Scheduled:  . aspirin EC  325 mg Oral q morning - 10a  . carvedilol  25 mg Oral BID WC  . cholecalciferol  1,000 Units Oral q morning - 10a  . heparin  5,000 Units Subcutaneous Q8H  . hydrALAZINE  25 mg Oral TID  . isosorbide mononitrate  60 mg Oral Daily  . multivitamin with minerals  1 tablet Oral Daily  . pantoprazole  40 mg Oral Daily  . sodium chloride  500 mL Intravenous Once  . sodium chloride  3 mL Intravenous Q12H  . tamsulosin  0.4 mg Oral QPC supper  . Travoprost (BAK Free)  1 drop Both Eyes BID  . [DISCONTINUED] furosemide  40 mg Intravenous BID   Assessment: 79 yo with MMP who was admitted for suspected CHF exacerbation but now with likely PNA. Empiric with vanc/zosyn. His baseline scr was around 2 and it has trend up toda.   Goal of Therapy:  Vancomycin trough level 15-20 mcg/ml  Plan:  Zosyn 2.25g IV q6 Vanc 1g IV q48   Ulyses Southward [place] 04/09/2013,2:43 PM  Addendum - asked to  add Levaquin per pharmacy.  Given his acute renal failure, will give Levaquin 750 mg IV x 1, then 500 mg q 48 hrs.  Will watch renal  function closely and adjust doses as needed.  Tad Moore, BCPS  Clinical Pharmacist Pager [phone]  04/09/2013 5:48 PM

## 2013-04-09 NOTE — Progress Notes (Signed)
Utilization Review Completed. 04/09/2013

## 2013-04-09 NOTE — Procedures (Signed)
Central Venous Catheter Insertion Procedure Note [name] [name] [id number] January 19, 1926  Procedure: Insertion of Central Venous Catheter Indications: Assessment of intravascular volume, Drug and/or fluid administration and Frequent blood sampling  Procedure Details Consent: Risks of procedure as well as the alternatives and risks of each were explained to the (patient/caregiver).  Consent for procedure obtained. Time Out: Verified patient identification, verified procedure, site/side was marked, verified correct patient position, special equipment/implants available, medications/allergies/relevent history reviewed, required imaging and test results available.  Performed Real time [place] used to ID and cannulate the vessel  Maximum sterile technique was used including antiseptics, cap, gloves, gown, hand hygiene, mask and sheet. Skin prep: Chlorhexidine; local anesthetic administered A antimicrobial bonded/coated triple lumen catheter was placed in the right internal jugular vein using the Seldinger technique.  Evaluation Blood flow good Complications: No apparent complications Patient did tolerate procedure well. Chest X-ray ordered to verify placement.  CXR: pending.  BABCOCK,PETE 04/09/2013, 4:21 PM  Patient seen and examined, agree with above note.  I dictated the care and orders written for this patient under my direction.  Alyson Reedy, MD [phone]

## 2013-04-09 NOTE — Progress Notes (Signed)
eLink Physician-Brief Progress Note Patient Name: Stelios Kirby DOB: 06-Nov-1926 MRN: [id number]  Date of Service  04/09/2013   HPI/Events of Note   Low CVP 5  eICU Interventions  500 bolus NS, then start levo gtt   Intervention Category Intermediate Interventions: Hypotension - evaluation and management  ALVA,RAKESH V. 04/09/2013, 6:29 PM

## 2013-04-09 NOTE — Consult Note (Signed)
[facility] Medical Consultation  [name] [name] ZOX:[id number] DOB: [birth year] DOA: 04/07/2013 PCP: Bufford Spikes, DO   Requesting physician: Dr. Elease Hashimoto Date of consultation: 04/09/13 Reason for consultation: difficulty breathing suspecting pna  Impression/Recommendations Active Problems: Sepsis CAD Chronic systolic heart failure Sepsis associated Hypotension  Acute on chronic renal failure    1. Sepsis - Agree with obtaining blood cultures. Nursing reports has already been done. - Initiate IV broad spectrum antibiotics with Vanc/Zosyn - Lactic acid levels - Agree with obtaining urinalysis and chest x ray - Consulted PCCM - hold lasix  2. Hypotension associated sepsis - hold lasix - administer fluid bolus 500 ml over the next 2 hours unless otherwise indicated by PCCM - Most likely related to # 1 and intravascular fluid depletion given recent lasix administration  3. Acute on Chronic systolic heart failure - Complicating fluid rehydration - Initial x ray reported curly B lines and interstitial pulmonary edema - Appreciate Cardiology and PCCM input - Agree with strict I/O's and daily weights. Continue foley for accurate output monitoring - BNP  16,363 although patient has h/o CKD   4. Acute on chronic renal failure - At this point likely due to # 1 and 2 in the context of recent lasix administration. - likely should improve with IVF rehydration.  5. CAD - stable continue aspirin  I will followup again tomorrow. Please contact me if I can be of assistance in the meanwhile. Thank you for this consultation.  Chief Complaint: SOB  HPI:  79 y/o with h/o Chronic systolic CHF, Chronic Kidney disease, CAD who presented initially with SOB, chest/abdominal discomfort.  Was initially seen by cardiology for ACS rule out.  Reports that for the last 2 days he has had difficulty breathing which has progressively got worse.  Nothing he is aware of makes it better or worse.   Was recently seen and evaluated reportedly for chest pain and was ruled out.  We were consulted for suspected pneumonia and further evaluation and recommendations.  Review of Systems:  10 point review of systems reviewed and negative unless otherwise mentioned above.  Past Medical History  Diagnosis Date  . History of colon cancer     s/p colon resection  . Aortic stenosis     a. s/p AVR with 23mm Edwards pericardial valve 02/07/12 - post-op course complicated by pleural effusion requring thoracentesis/leg cellulitis 03/2012.  Marland Kitchen CAD (coronary artery disease)     a. s/p NSTEMI 12/2011:  LHC - Ostial left main 20%, ostial LAD 50%, mid 60-70%, ostial D1 40% and mid 40%, D2 70%, ostial circumflex occluded, ostial RCA 80-90%, LVEDP was 42. b.  s/p CABG x 3 at time of AVR (LiMA-LAD, SVG-2nd daigonal, SVG-PDA) 02/07/12 (post-op course noted above).  . Ischemic cardiomyopathy   . Chronic systolic heart failure     a. TEE 01/2012: EF 25-30%, diffuse hypokinesis. b. Not on ACEI due to renal insufficiency.;  c. follow up  echo 08/06/12: EF 25%, mod diast dysfxn, AVR ok, mild MR, mod LAE, mild RAE, mild to mod RV systolic dysfunction  . HLD (hyperlipidemia)   . GERD (gastroesophageal reflux disease)   . Chronic kidney disease   . Pleural effusion     a. post-operatively after AVR/CABG s/p thoracentesis 03/2012 yielding 1L serosanguinous fluid.  . Diabetes mellitus     borderline  . Blood transfusion     NO REACTION TO TRANSFUSION  . Cellulitis     a. RLE cellulitis 2 months post-operatively after AVR/CABG - serratia  marcessans, tx with I&D/antibiotics  . Mitral regurgitation     Moderate by TEE 01/2012  . Flash pulmonary edema     Post-cath 12/2011, went into acute pulm edema requiring IV lasix and intubation  . Baker's cyst 07/23/12    Incidental finding of LE venous dopplers  . Cataract     right eye, hx of  . HTN (hypertension)     primary, Dr. Dorris Fetch  . Myocardial infarction 12/2011  .  CHF (congestive heart failure)   . Stroke 10/01    RIght Leg weakness  . Cancer '90's    Colon  . Anemia    Past Surgical History  Procedure Laterality Date  . Colon resection  1996  . Esophageal dilation    . Colonoscopy    . Cataract extraction      rt  . Cardiac catheterization      1.3.13  stopped breathing, put on ventilator for 5-6 days  . Tonsillectomy    . Aortic valve replacement  02/07/2012    Procedure: AORTIC VALVE REPLACEMENT (AVR);  Surgeon: [name], MD;  Location: [facility] OR;  Service: Open Heart Surgery;  Laterality: N/A;  . Coronary artery bypass graft  02/07/2012    Procedure: CORONARY ARTERY BYPASS GRAFTING (CABG);  Surgeon: [name], MD;  Location: [facility] OR;  Service: Open Heart Surgery;  Laterality: N/A;  CABG x three; using right leg greater saphenous vein harvested endoscopically  . Chest tube insertion  07/24/2012    Procedure: INSERTION PLEURAL DRAINAGE CATHETER;  Surgeon: [name], MD;  Location: [facility] OR;  Service: Thoracic;  Laterality: Left;  . Talc pleurodesis  08/30/2012    Procedure: TALC PLEURADESIS;  Surgeon: [name], MD;  Location: [facility] OR;  Service: Thoracic;  Laterality: Left;  INSERTION OF TALC VIA LEFT PLEURX  . Talc pleurodesis  09/12/2012    Procedure: TALC PLEURADESIS;  Surgeon: [name], MD;  Location: [facility] OR;  Service: Thoracic;  Laterality: Left;  . Removal of pleural drainage catheter  09/27/2012    Procedure: REMOVAL OF PLEURAL DRAINAGE CATHETER;  Surgeon: [name], MD;  Location: [facility] OR;  Service: Thoracic;  Laterality: Left;  Marland Kitchen Eye surgery  2009    cataract removed from right eye   Social History:  reports that he has never smoked. He has never used smokeless tobacco. He reports that he does not drink alcohol or use illicit drugs.  Allergies  Allergen Reactions  . Statins     Pain/weakness in legs   Family History  Problem Relation Age of Onset  . Cancer Mother   . Heart attack Father   . Mental illness  Brother   . Kidney failure Brother     Prior to Admission medications   Medication Sig Start Date End Date Taking? Authorizing Provider  aspirin EC 325 MG tablet Take 325 mg by mouth every morning.    Yes Historical Provider, MD  carvedilol (COREG) 25 MG tablet Take 1 tablet (25 mg total) by mouth 2 (two) times daily with a meal. 01/15/13  Yes Gaylord Shih, MD  cholecalciferol (VITAMIN D) 1000 UNITS tablet Take 1,000 Units by mouth every morning.    Yes Historical Provider, MD  epoetin alfa (EPOGEN,PROCRIT) 2000 UNIT/ML injection Inject 2,000 Units into the skin every 21 ( twenty-one) days. If hemoglobin <10   Yes Historical Provider, MD  furosemide (LASIX) 40 MG tablet Take 40 mg by mouth 2 (two) times daily.  09/10/12  Yes Beatrice Lecher, PA-C  hydrALAZINE (APRESOLINE) 25 MG tablet Take 25 mg by mouth 3 (three) times daily.   Yes Historical Provider, MD  isosorbide mononitrate (IMDUR) 30 MG 24 hr tablet Take 30 mg by mouth every morning.   Yes Historical Provider, MD  Multiple Vitamin (MULTIVITAMIN) capsule Take 1 capsule by mouth every morning.    Yes Historical Provider, MD  nitroGLYCERIN (NITROSTAT) 0.4 MG SL tablet Place 1 tablet (0.4 mg total) under the tongue every 5 (five) minutes x 3 doses as needed. For chest pain 01/15/13  Yes Gaylord Shih, MD  omeprazole (PRILOSEC) 20 MG capsule Take 20 mg by mouth every morning.    Yes Historical Provider, MD  Prenatal Vit-Fe Fumarate-FA (MULTIVITAMIN-PRENATAL) 27-0.8 MG TABS Take 1 tablet by mouth every evening.   Yes Historical Provider, MD  Tamsulosin HCl (FLOMAX) 0.4 MG CAPS Take 1 capsule (0.4 mg total) by mouth daily after supper. 12/19/12  Yes Gaylord Shih, MD  Travoprost, BAK Free, (TRAVATAN) 0.004 % SOLN ophthalmic solution Place 1 drop into both eyes 2 (two) times daily.   Yes Historical Provider, MD   Physical Exam: Blood pressure 94/39, pulse 72, temperature 100.7 F (38.2 C), temperature source Axillary, resp. rate 27, height 5\' 8"  (1.727  m), weight 67 kg (147 lb 11.3 oz), SpO2 95.00%. Filed Vitals:   04/09/13 1016 04/09/13 1146 04/09/13 1330 04/09/13 1356  BP: 94/36 94/39    Pulse:  72    Temp:  98.5 F (36.9 C) 100.7 F (38.2 C)   TempSrc:  Oral Axillary   Resp:  27    Height:      Weight:      SpO2:  95%  95%     General:  Pt appears weak, but in NAD   Eyes: EOMI, non icteric  ENT: normal exterior appearance with dry mucus membranes  Neck: supple, no goiter  Cardiovascular: RRR, no MRG  Respiratory: Rhales at right lung base, no wheezes  Abdomen: soft, NT, ND  Skin: warm and dry  Musculoskeletal: no cyanosis or clubbing  Psychiatric: mood and affect appropriate  Neurologic: moves all extremities, no facial asymmetry  Labs on Admission:  Basic Metabolic Panel:  Recent Labs Lab 04/07/13 1702 04/08/13 0114 04/08/13 0655 04/09/13 0420  NA 140  --  138 135  K 4.5  --  4.1 3.5  CL 99  --  97 96  CO2 32  --  31 28  GLUCOSE 150*  --  185* 127*  BUN 42*  --  42* 55*  CREATININE 2.27* 2.19* 2.18* 3.22*  CALCIUM 10.3  --  9.4 8.9   Liver Function Tests:  Recent Labs Lab 04/07/13 1702  AST 19  ALT 16  ALKPHOS 109  BILITOT 0.5  PROT 7.9  ALBUMIN 3.5   No results found for this basename: LIPASE, AMYLASE,  in the last 168 hours No results found for this basename: AMMONIA,  in the last 168 hours CBC:  Recent Labs Lab 04/07/13 1702 04/08/13 0114 04/08/13 0655  WBC 5.9 9.5 11.6*  NEUTROABS 3.5  --   --   HGB 12.0* 11.8* 11.7*  HCT 35.7* 35.4* 35.4*  MCV 88.1 88.3 88.5  PLT 134* 123* 129*   Cardiac Enzymes:  Recent Labs Lab 04/08/13 0114 04/08/13 0655 04/08/13 1548  TROPONINI <0.30 <0.30 <0.30   BNP: No components found with this basename: POCBNP,  CBG:  Recent Labs Lab 04/07/13 1701  GLUCAP 154*    Radiological  Exams on Admission: Dg Chest Port 1 View  04/07/2013  *RADIOLOGY REPORT*  Clinical Data: Abdominal pain.  Chest pain.  PORTABLE CHEST - 1 VIEW   Comparison: 01/30/2013.  Findings: Cardiomegaly.  Median sternotomy / CABG.  Pulmonary vascular congestion is present.  Interstitial pulmonary edema present with Charyl Dancer B lines at the periphery, most notable at the right base.  No pleural effusion is identified.  Mild prostatic aortic valve.  IMPRESSION: Mild CHF with cardiomegaly and interstitial pulmonary edema.   Original Report Authenticated By: Andreas Newport, M.D.     EKG: Independently reviewed. NSR with no ST elevations or depressions  Time spent: > 60 minutes  Penny Pia [facility] Pager [phone]  If 7PM-7AM, please contact night-coverage [email or web address] Password [facility] 04/09/2013, 2:41 PM

## 2013-04-09 NOTE — H&P (Signed)
ADMISSION HISTORY AND PHYSICAL   Date: 04/09/2013               Patient Name:  [name] [name] MRN: [id number]  DOB: [birth year] Age / Sex: 79 y.o., male        PCP: Bufford Spikes Primary Cardiologist: Juanito Doom, MD         History of Present Illness Patient is a 79 y.o. male with a PMHx of CAD, CABG, AVR, anemia, CKD, who was admitted to [facility] on 04/07/2013 for evaluation of CP and dyspnea.     [name] presents with abdominal pain and chest pain since 2:30 this afternoon.  No N/V/D.  Took 3 SL NTG without relief.    He had just finished preparing food in the kitchen.     He was admitted 2 days ago with atypical CP.    He has ruled out for MI.  He is now running a temperature of 101.     Medications: Outpatient medications: Prescriptions prior to admission  Medication Sig Dispense Refill  . aspirin EC 325 MG tablet Take 325 mg by mouth every morning.       . carvedilol (COREG) 25 MG tablet Take 1 tablet (25 mg total) by mouth 2 (two) times daily with a meal.  60 tablet  5  . cholecalciferol (VITAMIN D) 1000 UNITS tablet Take 1,000 Units by mouth every morning.       Marland Kitchen epoetin alfa (EPOGEN,PROCRIT) 2000 UNIT/ML injection Inject 2,000 Units into the skin every 21 ( twenty-one) days. If hemoglobin <10      . furosemide (LASIX) 40 MG tablet Take 40 mg by mouth 2 (two) times daily.       . hydrALAZINE (APRESOLINE) 25 MG tablet Take 25 mg by mouth 3 (three) times daily.      . isosorbide mononitrate (IMDUR) 30 MG 24 hr tablet Take 30 mg by mouth every morning.      . Multiple Vitamin (MULTIVITAMIN) capsule Take 1 capsule by mouth every morning.       . nitroGLYCERIN (NITROSTAT) 0.4 MG SL tablet Place 1 tablet (0.4 mg total) under the tongue every 5 (five) minutes x 3 doses as needed. For chest pain  25 tablet  5  . omeprazole (PRILOSEC) 20 MG capsule Take 20 mg by mouth every morning.       . Prenatal Vit-Fe Fumarate-FA (MULTIVITAMIN-PRENATAL) 27-0.8 MG TABS Take 1 tablet by mouth  every evening.      . Tamsulosin HCl (FLOMAX) 0.4 MG CAPS Take 1 capsule (0.4 mg total) by mouth daily after supper.  30 capsule  3  . Travoprost, BAK Free, (TRAVATAN) 0.004 % SOLN ophthalmic solution Place 1 drop into both eyes 2 (two) times daily.        Allergies  Allergen Reactions  . Statins     Pain/weakness in legs     Past Medical History  Diagnosis Date  . History of colon cancer     s/p colon resection  . Aortic stenosis     a. s/p AVR with 23mm Edwards pericardial valve 02/07/12 - post-op course complicated by pleural effusion requring thoracentesis/leg cellulitis 03/2012.  Marland Kitchen CAD (coronary artery disease)     a. s/p NSTEMI 12/2011:  LHC - Ostial left main 20%, ostial LAD 50%, mid 60-70%, ostial D1 40% and mid 40%, D2 70%, ostial circumflex occluded, ostial RCA 80-90%, LVEDP was 42. b.  s/p CABG x 3 at time of AVR (LiMA-LAD, SVG-2nd daigonal, SVG-PDA)  02/07/12 (post-op course noted above).  . Ischemic cardiomyopathy   . Chronic systolic heart failure     a. TEE 01/2012: EF 25-30%, diffuse hypokinesis. b. Not on ACEI due to renal insufficiency.;  c. follow up  echo 08/06/12: EF 25%, mod diast dysfxn, AVR ok, mild MR, mod LAE, mild RAE, mild to mod RV systolic dysfunction  . HLD (hyperlipidemia)   . GERD (gastroesophageal reflux disease)   . Chronic kidney disease   . Pleural effusion     a. post-operatively after AVR/CABG s/p thoracentesis 03/2012 yielding 1L serosanguinous fluid.  . Diabetes mellitus     borderline  . Blood transfusion     NO REACTION TO TRANSFUSION  . Cellulitis     a. RLE cellulitis 2 months post-operatively after AVR/CABG - serratia marcessans, tx with I&D/antibiotics  . Mitral regurgitation     Moderate by TEE 01/2012  . Flash pulmonary edema     Post-cath 12/2011, went into acute pulm edema requiring IV lasix and intubation  . Baker's cyst 07/23/12    Incidental finding of LE venous dopplers  . Cataract     right eye, hx of  . HTN (hypertension)      primary, Dr. Dorris Fetch  . Myocardial infarction 12/2011  . CHF (congestive heart failure)   . Stroke 10/01    RIght Leg weakness  . Cancer '90's    Colon  . Anemia     Past Surgical History  Procedure Laterality Date  . Colon resection  1996  . Esophageal dilation    . Colonoscopy    . Cataract extraction      rt  . Cardiac catheterization      1.3.13  stopped breathing, put on ventilator for 5-6 days  . Tonsillectomy    . Aortic valve replacement  02/07/2012    Procedure: AORTIC VALVE REPLACEMENT (AVR);  Surgeon: [name], MD;  Location: [facility] OR;  Service: Open Heart Surgery;  Laterality: N/A;  . Coronary artery bypass graft  02/07/2012    Procedure: CORONARY ARTERY BYPASS GRAFTING (CABG);  Surgeon: [name], MD;  Location: [facility] OR;  Service: Open Heart Surgery;  Laterality: N/A;  CABG x three; using right leg greater saphenous vein harvested endoscopically  . Chest tube insertion  07/24/2012    Procedure: INSERTION PLEURAL DRAINAGE CATHETER;  Surgeon: [name], MD;  Location: [facility] OR;  Service: Thoracic;  Laterality: Left;  . Talc pleurodesis  08/30/2012    Procedure: TALC PLEURADESIS;  Surgeon: [name], MD;  Location: [facility] OR;  Service: Thoracic;  Laterality: Left;  INSERTION OF TALC VIA LEFT PLEURX  . Talc pleurodesis  09/12/2012    Procedure: TALC PLEURADESIS;  Surgeon: [name], MD;  Location: [facility] OR;  Service: Thoracic;  Laterality: Left;  . Removal of pleural drainage catheter  09/27/2012    Procedure: REMOVAL OF PLEURAL DRAINAGE CATHETER;  Surgeon: [name], MD;  Location: [facility] OR;  Service: Thoracic;  Laterality: Left;  Marland Kitchen Eye surgery  2009    cataract removed from right eye    Family History  Problem Relation Age of Onset  . Cancer Mother   . Heart attack Father   . Mental illness Brother   . Kidney failure Brother     Social History:  reports that he has never smoked. He has never used smokeless tobacco. He reports that he does not drink  alcohol or use illicit drugs.     Physical Exam: BP  94/39  Pulse 72  Temp(Src) 98.5 F (36.9 C) (Oral)  Resp 27  Ht 5\' 8"  (1.727 m)  Wt 147 lb 11.3 oz (67 kg)  BMI 22.46 kg/m2  SpO2 95%  General: Vital signs reviewed and noted. Chronically ill appearing gentleman  Head: Normocephalic, atraumatic, sclera anicteric, mucus membranes are moist  Neck: Supple. Negative for carotid bruits. JVD not elevated.  Lungs:  Coarse rhonchi, wheezes, decreased breath sounds  Heart: RRR with normal S1, S2, soft systolic murmur to ULSB, 3/6 systolic murmur to axilla,   Abdomen:  Soft,mildly tender , mildly distended.  + BS  MSK: Strength and the appear normal for age.  Extremities: No clubbing or cyanosis. Trace - 1 + pitting   Neurologic: Alert and oriented X 3. More lethargic today.  Psych:  less able to respond today.    Lab results: Basic Metabolic Panel:  Recent Labs Lab 04/07/13 1702 04/08/13 0114 04/08/13 0655 04/09/13 0420  NA 140  --  138 135  K 4.5  --  4.1 3.5  CL 99  --  97 96  CO2 32  --  31 28  GLUCOSE 150*  --  185* 127*  BUN 42*  --  42* 55*  CREATININE 2.27* 2.19* 2.18* 3.22*  CALCIUM 10.3  --  9.4 8.9    Liver Function Tests:  Recent Labs Lab 04/07/13 1702  AST 19  ALT 16  ALKPHOS 109  BILITOT 0.5  PROT 7.9  ALBUMIN 3.5   No results found for this basename: LIPASE, AMYLASE,  in the last 168 hours  CBC:  Recent Labs Lab 04/02/13 1407 04/07/13 1702 04/08/13 0114 04/08/13 0655  WBC 4.9 5.9 9.5 11.6*  NEUTROABS 3.0 3.5  --   --   HGB 10.8* 12.0* 11.8* 11.7*  HCT 32.7* 35.7* 35.4* 35.4*  MCV 90.2 88.1 88.3 88.5  PLT 114* 134* 123* 129*    Cardiac Enzymes:  Recent Labs Lab 04/08/13 0114 04/08/13 0655 04/08/13 1548  TROPONINI <0.30 <0.30 <0.30    BNP: No components found with this basename: POCBNP,   CBG:  Recent Labs Lab 04/07/13 1701  GLUCAP 154*    Coagulation Studies: No results found for this basename: LABPROT, INR,  in  the last 72 hours    Imaging: Dg Chest Port 1 View  04/07/2013  *RADIOLOGY REPORT*  Clinical Data: Abdominal pain.  Chest pain.  PORTABLE CHEST - 1 VIEW  Comparison: 01/30/2013.  Findings: Cardiomegaly.  Median sternotomy / CABG.  Pulmonary vascular congestion is present.  Interstitial pulmonary edema present with Charyl Dancer B lines at the periphery, most notable at the right base.  No pleural effusion is identified.  Mild prostatic aortic valve.  IMPRESSION: Mild CHF with cardiomegaly and interstitial pulmonary edema.   Original Report Authenticated By: Andreas Newport, M.D.       Assessment & Plan: 1. CHF:  Mr. Saxer presents with abdominal pain, chest pain and dyspnea.  We have diuresed him but now his symptoms are sounding more like pneumonia.  He has a temp of 101.  He has thich, coarse rhonchi and wheezes.  His WBC had increased slightly yesterday .  Repeat CBC is pending today.   Will get [facility] to assist.   At this point, I do not think he primary problem is cardiac.  Consider transfer to [facility] if they agree.  2.  CKD:  His creatinine has increased with diuresis. Cr = 3.2.  3. Anemia:  4. Aortic stenosis  5.  Mitral regurgitation:   Alvia Grove., MD, [facility] 04/09/2013, 1:34 PM

## 2013-04-09 NOTE — Consult Note (Signed)
PULMONARY  / CRITICAL CARE MEDICINE  Name: [name] [name] MRN: [id number] DOB: Mar 30, 1926    ADMISSION DATE:  04/07/2013 CONSULTATION DATE:  4/30  REFERRING MD :  vega PRIMARY SERVICE: [facility]-->PCCM  CHIEF COMPLAINT:  Dyspnea/septic shock  BRIEF PATIENT DESCRIPTION:  79 year old male admitted on 4/28 for what eventually felt to be PNA after he failed to respond to diuretics and treatment for heart failure. PCCM asked to see on 4/30 for severe sepsis/septic shock.   SIGNIFICANT EVENTS / STUDIES:    LINES / TUBES: Right IJ CVL 4/30>>>  CULTURES: u strep 4/30>>> u legionella 4/30>>>  ANTIBIOTICS: vanc 4/30>>> Zosyn 4/30>>> levaquin 4/30>>>  HISTORY OF PRESENT ILLNESS:   This is a 3 yom w/ h/o CAD, AVR, and systolic HF (EF 25%). Admitted on 4/28 w/ rather sudden onset of left sided CP and abd discomfort. Initially felt do be decompensated heart failure and was treated w/ oxygen and lasix. His symptoms continued to worsen, he developed progressive hypotension, fever, productive cough and worsening renal failure. PCCM asked to see.  PAST MEDICAL HISTORY :  Past Medical History  Diagnosis Date  . History of colon cancer     s/p colon resection  . Aortic stenosis     a. s/p AVR with 23mm Edwards pericardial valve 02/07/12 - post-op course complicated by pleural effusion requring thoracentesis/leg cellulitis 03/2012.  Marland Kitchen CAD (coronary artery disease)     a. s/p NSTEMI 12/2011:  LHC - Ostial left main 20%, ostial LAD 50%, mid 60-70%, ostial D1 40% and mid 40%, D2 70%, ostial circumflex occluded, ostial RCA 80-90%, LVEDP was 42. b.  s/p CABG x 3 at time of AVR (LiMA-LAD, SVG-2nd daigonal, SVG-PDA) 02/07/12 (post-op course noted above).  . Ischemic cardiomyopathy   . Chronic systolic heart failure     a. TEE 01/2012: EF 25-30%, diffuse hypokinesis. b. Not on ACEI due to renal insufficiency.;  c. follow up  echo 08/06/12: EF 25%, mod diast dysfxn, AVR ok, mild MR, mod LAE, mild RAE, mild to  mod RV systolic dysfunction  . HLD (hyperlipidemia)   . GERD (gastroesophageal reflux disease)   . Chronic kidney disease   . Pleural effusion     a. post-operatively after AVR/CABG s/p thoracentesis 03/2012 yielding 1L serosanguinous fluid.  . Diabetes mellitus     borderline  . Blood transfusion     NO REACTION TO TRANSFUSION  . Cellulitis     a. RLE cellulitis 2 months post-operatively after AVR/CABG - serratia marcessans, tx with I&D/antibiotics  . Mitral regurgitation     Moderate by TEE 01/2012  . Flash pulmonary edema     Post-cath 12/2011, went into acute pulm edema requiring IV lasix and intubation  . Baker's cyst 07/23/12    Incidental finding of LE venous dopplers  . Cataract     right eye, hx of  . HTN (hypertension)     primary, Dr. Dorris Fetch  . Myocardial infarction 12/2011  . CHF (congestive heart failure)   . Stroke 10/01    RIght Leg weakness  . Cancer '90's    Colon  . Anemia    Past Surgical History  Procedure Laterality Date  . Colon resection  1996  . Esophageal dilation    . Colonoscopy    . Cataract extraction      rt  . Cardiac catheterization      1.3.13  stopped breathing, put on ventilator for 5-6 days  . Tonsillectomy    . Aortic  valve replacement  02/07/2012    Procedure: AORTIC VALVE REPLACEMENT (AVR);  Surgeon: [name], MD;  Location[facility] OR;  Service: Open Heart Surgery;  Laterality: N/A;  . Coronary artery bypass graft  02/07/2012    Procedure: CORONARY ARTERY BYPASS GRAFTING (CABG);  Surgeon: [name], MD;  Location: [facility] OR;  Service: Open Heart Surgery;  Laterality: N/A;  CABG x three; using right leg greater saphenous vein harvested endoscopically  . Chest tube insertion  07/24/2012    Procedure: INSERTION PLEURAL DRAINAGE CATHETER;  Surgeon: [name], MD;  Location: [facility] OR;  Service: Thoracic;  Laterality: Left;  . Talc pleurodesis  08/30/2012    Procedure: TALC PLEURADESIS;  Surgeon: [name], MD;  Location: [facility] OR;   Service: Thoracic;  Laterality: Left;  INSERTION OF TALC VIA LEFT PLEURX  . Talc pleurodesis  09/12/2012    Procedure: TALC PLEURADESIS;  Surgeon: [name], MD;  Location: [facility] OR;  Service: Thoracic;  Laterality: Left;  . Removal of pleural drainage catheter  09/27/2012    Procedure: REMOVAL OF PLEURAL DRAINAGE CATHETER;  Surgeon: [name], MD;  Location: [facility] OR;  Service: Thoracic;  Laterality: Left;  Marland Kitchen Eye surgery  2009    cataract removed from right eye   Prior to Admission medications   Medication Sig Start Date End Date Taking? Authorizing Provider  aspirin EC 325 MG tablet Take 325 mg by mouth every morning.    Yes Historical Provider, MD  carvedilol (COREG) 25 MG tablet Take 1 tablet (25 mg total) by mouth 2 (two) times daily with a meal. 01/15/13  Yes Gaylord Shih, MD  cholecalciferol (VITAMIN D) 1000 UNITS tablet Take 1,000 Units by mouth every morning.    Yes Historical Provider, MD  epoetin alfa (EPOGEN,PROCRIT) 2000 UNIT/ML injection Inject 2,000 Units into the skin every 21 ( twenty-one) days. If hemoglobin <10   Yes Historical Provider, MD  furosemide (LASIX) 40 MG tablet Take 40 mg by mouth 2 (two) times daily.  09/10/12  Yes Scott Moishe Spice, PA-C  hydrALAZINE (APRESOLINE) 25 MG tablet Take 25 mg by mouth 3 (three) times daily.   Yes Historical Provider, MD  isosorbide mononitrate (IMDUR) 30 MG 24 hr tablet Take 30 mg by mouth every morning.   Yes Historical Provider, MD  Multiple Vitamin (MULTIVITAMIN) capsule Take 1 capsule by mouth every morning.    Yes Historical Provider, MD  nitroGLYCERIN (NITROSTAT) 0.4 MG SL tablet Place 1 tablet (0.4 mg total) under the tongue every 5 (five) minutes x 3 doses as needed. For chest pain 01/15/13  Yes Gaylord Shih, MD  omeprazole (PRILOSEC) 20 MG capsule Take 20 mg by mouth every morning.    Yes Historical Provider, MD  Prenatal Vit-Fe Fumarate-FA (MULTIVITAMIN-PRENATAL) 27-0.8 MG TABS Take 1 tablet by mouth every evening.   Yes  Historical Provider, MD  Tamsulosin HCl (FLOMAX) 0.4 MG CAPS Take 1 capsule (0.4 mg total) by mouth daily after supper. 12/19/12  Yes Gaylord Shih, MD  Travoprost, BAK Free, (TRAVATAN) 0.004 % SOLN ophthalmic solution Place 1 drop into both eyes 2 (two) times daily.   Yes Historical Provider, MD   Allergies  Allergen Reactions  . Statins     Pain/weakness in legs    FAMILY HISTORY:  Family History  Problem Relation Age of Onset  . Cancer Mother   . Heart attack Father   . Mental illness Brother   . Kidney failure Brother  SOCIAL HISTORY:  reports that he has never smoked. He has never used smokeless tobacco. He reports that he does not drink alcohol or use illicit drugs.  REVIEW OF SYSTEMS:   Review of Systems  Constitutional: No weight loss, gain, night sweats, Fevers, chills, fatigue .  HEENT: No headaches, visual changes, Difficulty swallowing, Tooth/dental problems, or Sore throat,  No sneezing, itching, ear ache, nasal congestion, post nasal drip, no visual complaints CV: No chest pain, Orthopnea, PND, swelling in lower extremities, dizziness, palpitations, syncope.  GI No heartburn, indigestion, abdominal pain, nausea, vomiting, diarrhea, change in bowel habits, loss of appetite, bloody stools.  Resp: No cough, yellow tinged sputum No coughing up of blood. No change in color of mucus. No wheezing.  Skin: no rash or itching or icterus GU: no dysuria, change in color of urine, no urgency or frequency. No flank pain, no hematuria  MS: No joint pain or swelling. No decreased range of motion  Psych: No change in mood or affect. No depression or anxiety.  Neuro: no difficulty with speech, weakness, numbness, ataxia    SUBJECTIVE:  SOB  VITAL SIGNS: Temp:  [97.9 F (36.6 C)-101 F (38.3 C)] 100.7 F (38.2 C) (04/30 1330) Pulse Rate:  [61-79] 72 (04/30 1146) Resp:  [20-34] 27 (04/30 1146) BP: (91-142)/(34-50) 94/39 mmHg (04/30 1146) SpO2:  [84 %-97 %] 95 % (04/30  1356) Weight:  [67 kg (147 lb 11.3 oz)] 67 kg (147 lb 11.3 oz) (04/30 0412) HEMODYNAMICS:   VENTILATOR SETTINGS:   INTAKE / OUTPUT: Intake/Output     04/29 0701 - 04/30 0700 04/30 0701 - 05/01 0700   P.O. 840 310   I.V. (mL/kg) 18 (0.3) 333 (5)   Total Intake(mL/kg) 858 (12.8) 643 (9.6)   Urine (mL/kg/hr) 1250 (0.8) 90 (0.2)   Total Output 1250 90   Net -392 +553        Stool Occurrence 1 x      PHYSICAL EXAMINATION: General:  Toxic appearing male, not in acute distress but does have increased WOB Neuro:  Awake, oriented  HEENT:  + upper airway noises.  Cardiovascular:  rrr Lungs:  Diffuse rhonchi  Abdomen:  Soft, not tender  Musculoskeletal:  intact Skin:  Scattered areas of ecchymosis   LABS:  Recent Labs Lab 04/07/13 1702 04/08/13 0114 04/08/13 0655 04/09/13 0420  NA 140  --  138 135  K 4.5  --  4.1 3.5  CL 99  --  97 96  CO2 32  --  31 28  BUN 42*  --  42* 55*  CREATININE 2.27* 2.19* 2.18* 3.22*  GLUCOSE 150*  --  185* 127*    Recent Labs Lab 04/08/13 0114 04/08/13 0655 04/09/13 1422  HGB 11.8* 11.7* 10.2*  HCT 35.4* 35.4* 30.2*  WBC 9.5 11.6* 15.0*  PLT 123* 129* PENDING    Recent Labs Lab 04/07/13 1702 04/08/13 0114 04/08/13 0655 04/09/13 1422  WBC 5.9 9.5 11.6* 15.0*     Recent Labs Lab 04/07/13 1701  GLUCAP 154*    CXR: LLL consolidative airspace disease.   ASSESSMENT / PLAN:  PULMONARY A: pneumonia      CP P:   Supplemental oxygen   CARDIOVASCULAR A:  severe sepsis in setting of PNA H/o CHF (EF 25%) P:  Check stat lactic acid Stop all diuretics and hold antihypertensives.  Volume challenge, careful to avoid overload  Cont IVs Family ok w/ central access but hope to avoid   RENAL A:  Acute renal  failure  P:   Volume challenge  GASTROINTESTINAL A:  No acute issues  Wonder about aspiration  P:   Consider aspiration eval  HEMATOLOGIC A:  Chronic anemia P:  Trend CBC Cont [place] heparin  INFECTIOUS A:    Pneumonia   sirs/sepsis  P:   Will cont vanc and zosyn (possible asp). Add levaquin Send urine studies Cycle PCT  ENDOCRINE A: hyperglycemia P:   Trend fasting glucose   NEUROLOGIC A:  No def P:   Supportive care   TODAY'S SUMMARY:   I have personally obtained a history, examined the patient, evaluated laboratory and imaging results, formulated the assessment and plan and placed orders.  CRITICAL CARE: The patient is critically ill with multiple organ systems failure and requires high complexity decision making for assessment and support, frequent evaluation and titration of therapies, application of advanced monitoring technologies and extensive interpretation of multiple databases. Critical Care Time devoted to patient care services described in this note is 45 minutes.   Alyson Reedy, M.D. Pulmonary and Critical Care Medicine [facility] Pager: [phone]  04/09/2013, 2:57 PM

## 2013-04-10 DIAGNOSIS — R7881 Bacteremia: Secondary | ICD-10-CM | POA: Diagnosis present

## 2013-04-10 DIAGNOSIS — I2510 Atherosclerotic heart disease of native coronary artery without angina pectoris: Secondary | ICD-10-CM

## 2013-04-10 LAB — CBC WITH DIFFERENTIAL/PLATELET
Basophils Absolute: 0 10*3/uL (ref 0.0–0.1)
Basophils Relative: 0 % (ref 0–1)
Hemoglobin: 10 g/dL — ABNORMAL LOW (ref 13.0–17.0)
Lymphocytes Relative: 5 % — ABNORMAL LOW (ref 12–46)
MCHC: 34.1 g/dL (ref 30.0–36.0)
Monocytes Relative: 3 % (ref 3–12)
Neutro Abs: 8.1 10*3/uL — ABNORMAL HIGH (ref 1.7–7.7)
Neutrophils Relative %: 92 % — ABNORMAL HIGH (ref 43–77)
RBC: 3.36 MIL/uL — ABNORMAL LOW (ref 4.22–5.81)
WBC: 8.9 10*3/uL (ref 4.0–10.5)

## 2013-04-10 LAB — GLUCOSE, CAPILLARY
Glucose-Capillary: 125 mg/dL — ABNORMAL HIGH (ref 70–99)
Glucose-Capillary: 177 mg/dL — ABNORMAL HIGH (ref 70–99)
Glucose-Capillary: 144 mg/dL — ABNORMAL HIGH (ref 70–99)

## 2013-04-10 LAB — LEGIONELLA ANTIGEN, URINE

## 2013-04-10 NOTE — Progress Notes (Signed)
While patient is in ICU will defer medical management to PCCM as per protocol.  Would like to thank PCCM for all their help.  Once patient is out of ICU will continue medical management.  Daysie Helf, [employer]

## 2013-04-10 NOTE — Consult Note (Signed)
PULMONARY  / CRITICAL CARE MEDICINE  Name: [name] [name] MRN: [id number] DOB: 01/02/1926    ADMISSION DATE:  04/07/2013 CONSULTATION DATE:  4/30  REFERRING MD :  vega PRIMARY SERVICE: [facility]M  CHIEF COMPLAINT:  Dyspnea/septic shock  BRIEF PATIENT DESCRIPTION:  79 year old male admitted on 4/28 for what eventually felt to be PNA after he failed to respond to diuretics and treatment for heart failure. PCCM asked to see on 4/30 for severe sepsis/septic shock. BC + for GNRs 5/1  SIGNIFICANT EVENTS / STUDIES:    LINES / TUBES: Right IJ CVL 4/30>>>  CULTURES: u strep 4/30>>>NEG u legionella 4/30>>> NEG Blood 4/30 >> 2/2 GNRs  ANTIBIOTICS: vanc 4/30>>> 5/1 Zosyn 4/30>>> levaquin 4/30>>>   SUBJECTIVE: Feels better. No new complaints  VITAL SIGNS: Temp:  [97.5 F (36.4 C)-98.1 F (36.7 C)] 97.9 F (36.6 C) (05/01 1600) Pulse Rate:  [58-71] 61 (05/01 1700) Resp:  [14-27] 22 (05/01 1700) BP: (87-126)/(30-89) 113/53 mmHg (05/01 1700) SpO2:  [97 %-99 %] 97 % (05/01 1700) Weight:  [66 kg (145 lb 8.1 oz)] 66 kg (145 lb 8.1 oz) (05/01 0500) HEMODYNAMICS: CVP:  [5 mmHg-9 mmHg] 5 mmHg VENTILATOR SETTINGS:   INTAKE / OUTPUT: Intake/Output     04/30 0701 - 05/01 0700 05/01 0701 - 05/02 0700   P.O. 310    I.V. (mL/kg) 483 (7.3) 80 (1.2)   IV Piggyback 1300 100   Total Intake(mL/kg) 2093 (31.7) 180 (2.7)   Urine (mL/kg/hr) 700 (0.4) 525 (0.8)   Stool 1 (0) 1 (0)   Total Output 701 526   Net +1392 -346          PHYSICAL EXAMINATION: General:  Frail but NAD Neuro:  Awake, oriented  HEENT: WNL Cardiovascular: RRR s M Lungs: Clear anteriorly Abdomen:  Soft, not tender  Ext: warm, no edema Skin:  Scattered areas of ecchymosis   LABS:  Recent Labs Lab 04/07/13 1702 04/08/13 0114 04/08/13 0655 04/09/13 0420  NA 140  --  138 135  K 4.5  --  4.1 3.5  CL 99  --  97 96  CO2 32  --  31 28  BUN 42*  --  42* 55*  CREATININE 2.27* 2.19* 2.18* 3.22*  GLUCOSE 150*   --  185* 127*    Recent Labs Lab 04/08/13 0655 04/09/13 1422 04/10/13 0500  HGB 11.7* 10.2* 10.0*  HCT 35.4* 30.2* 29.3*  WBC 11.6* 15.0* 8.9  PLT 129* 81* 63*    Recent Labs Lab 04/08/13 0114 04/08/13 0655 04/09/13 1422 04/09/13 1500 04/09/13 1551 04/10/13 0500  PROCALCITON  --   --   --   --  51.51 41.58  WBC 9.5 11.6* 15.0*  --   --  8.9  LATICACIDVEN  --   --   --  2.9*  --   --      Recent Labs Lab 04/09/13 1950 04/09/13 2201 04/10/13 0740 04/10/13 1155 04/10/13 1555  GLUCAP 142* 146* 125* 171* 177*    CXR: no new film  ASSESSMENT / PLAN:  PULMONARY A: pneumonia      P:   Supplemental oxygen   CARDIOVASCULAR A:  severe sepsis in setting of PNA H/o CHF (EF 25%) P:  Cont to monitor Cards input appreciate  RENAL A:  Acute renal failure, nonoliguric P:   Monitor BMET  GASTROINTESTINAL A:  No acute issues P:   Cont diet No indication for SUP but uses PPI @ home - continue  HEMATOLOGIC A:  Chronic anemia P:  Trend CBC Cont [place] heparin  INFECTIOUS A:   Suspected pneumonia   Severe Sepsis  GNR bacteremia P:   Micro and abx as above   ENDOCRINE A: hyperglycemia P:   Cont SSI   NEUROLOGIC A:  No abn P:   monitor  Watch in ICU through day today. If quiet 24 hrs, should be ready for SDU or Tele 5/2  Billy Fischer, MD ; [facility] service Mobile [phone].  After 5:30 PM or weekends, call [phone]

## 2013-04-10 NOTE — Progress Notes (Signed)
PROGRESS NOTE  Subjective:   79 yo admitted with dyspnea and atypical CP. He was found to have sepsis yesterday and was transferred to the MICU.  He was volume depleted and  He was given IVF.  His CVP is 7 this am.    Objective:    Vital Signs:   Temp:  [97.5 F (36.4 C)-100.7 F (38.2 C)] 97.6 F (36.4 C) (05/01 0741) Pulse Rate:  [58-72] 67 (05/01 0815) Resp:  [14-28] 22 (05/01 0815) BP: (84-126)/(30-64) 111/64 mmHg (05/01 0815) SpO2:  [93 %-99 %] 98 % (05/01 0815) Weight:  [145 lb 8.1 oz (66 kg)] 145 lb 8.1 oz (66 kg) (05/01 0500)  Last BM Date: 04/09/13   24-hour weight change: Weight change: 5 lb 15.2 oz (2.7 kg)  Weight trends: Filed Weights   04/08/13 0809 04/09/13 0412 04/10/13 0500  Weight: 139 lb 8.8 oz (63.3 kg) 147 lb 11.3 oz (67 kg) 145 lb 8.1 oz (66 kg)    Intake/Output:  04/30 0701 - 05/01 0700 In: 2093 [P.O.:310; I.V.:483; IV Piggyback:1300] Out: 701 [Urine:700; Stool:1] Total I/O In: 10 [I.V.:10] Out: -    Physical Exam: BP 111/64  Pulse 67  Temp(Src) 97.6 F (36.4 C) (Oral)  Resp 22  Ht 5\' 8"  (1.727 m)  Wt 145 lb 8.1 oz (66 kg)  BMI 22.13 kg/m2  SpO2 98%  General: Vital signs reviewed and noted.   Head: Normocephalic, atraumatic.  Eyes: conjunctivae/corneas clear.  EOM's intact.   Throat: normal  Neck:  normal  Lungs:    much better today.  Rhonchi and wheezing have cleared  Heart:  RR  Abdomen:  Soft, non-tender, non-distended    Extremities: No edema   Neurologic: A&O X3, CN II - XII are grossly intact.   Psych: Normal , more awake and alert.    Labs: BMET:  Recent Labs  04/08/13 0655 04/09/13 0420  NA 138 135  K 4.1 3.5  CL 97 96  CO2 31 28  GLUCOSE 185* 127*  BUN 42* 55*  CREATININE 2.18* 3.22*  CALCIUM 9.4 8.9    Liver function tests:  Recent Labs  04/07/13 1702  AST 19  ALT 16  ALKPHOS 109  BILITOT 0.5  PROT 7.9  ALBUMIN 3.5   No results found for this basename: LIPASE, AMYLASE,  in the last 72  hours  CBC:  Recent Labs  04/09/13 1422 04/10/13 0500  WBC 15.0* 8.9  NEUTROABS 13.7* 8.1*  HGB 10.2* 10.0*  HCT 30.2* 29.3*  MCV 87.8 87.2  PLT 81* 63*    Cardiac Enzymes:  Recent Labs  04/08/13 0114 04/08/13 0655 04/08/13 1548  TROPONINI <0.30 <0.30 <0.30    Coagulation Studies: No results found for this basename: LABPROT, INR,  in the last 72 hours    Other results:  Tele:  NSR  Medications:    Infusions: . norepinephrine (LEVOPHED) Adult infusion      Scheduled Medications: . aspirin EC  325 mg Oral q morning - 10a  . cholecalciferol  1,000 Units Oral q morning - 10a  . heparin  5,000 Units Subcutaneous Q8H  . insulin aspart  0-15 Units Subcutaneous TID WC  . insulin aspart  0-5 Units Subcutaneous QHS  . isosorbide mononitrate  60 mg Oral Daily  . [START ON 04/11/2013] levofloxacin (LEVAQUIN) IV  500 mg Intravenous Q48H  . pantoprazole  40 mg Oral Daily  . piperacillin-tazobactam (ZOSYN)  IV  2.25 g Intravenous Q6H  . sodium chloride  3 mL Intravenous Q12H  . tamsulosin  0.4 mg Oral QPC supper  . Travoprost (BAK Free)  1 drop Both Eyes BID  . vancomycin  1,000 mg Intravenous Q48H    Assessment/ Plan:      Sepsis:  Improving,  Will transition to Dr. Sung Amabile service. I appreciate his excellent care.   Active Problems:   Acute on chronic renal failure   CAD   Chronic systolic heart failure   Sepsis associated hypotension   Hypotension, unspecified  Will sign off after transfer to [facility] service.    Please call for questions.   Disposition:  Length of Stay: 3  Vesta Mixer, Montez Hageman., MD, [facility] 04/10/2013, 9:56 AM Office [phone] Pager [phone]

## 2013-04-11 ENCOUNTER — Inpatient Hospital Stay (HOSPITAL_COMMUNITY): Payer: Medicare Other

## 2013-04-11 LAB — CBC
MCHC: 34.4 g/dL (ref 30.0–36.0)
RDW: 18 % — ABNORMAL HIGH (ref 11.5–15.5)

## 2013-04-11 LAB — BASIC METABOLIC PANEL
GFR calc Af Amer: 16 mL/min — ABNORMAL LOW (ref >90)
GFR calc non Af Amer: 14 mL/min — ABNORMAL LOW (ref >90)
Potassium: 3.6 mEq/L (ref 3.5–5.1)
Sodium: 135 mEq/L (ref 135–145)

## 2013-04-11 LAB — HEPATIC FUNCTION PANEL
AST: 97 U/L — ABNORMAL HIGH (ref 0–37)
Bilirubin, Direct: 0.4 mg/dL — ABNORMAL HIGH (ref 0.0–0.3)

## 2013-04-11 LAB — GLUCOSE, CAPILLARY
Glucose-Capillary: 151 mg/dL — ABNORMAL HIGH (ref 70–99)
Glucose-Capillary: 110 mg/dL — ABNORMAL HIGH (ref 70–99)

## 2013-04-11 MED ORDER — LEVOFLOXACIN 500 MG PO TABS
500.0000 mg | ORAL_TABLET | Freq: Every day | ORAL | Status: DC
  Administered 2013-04-11: 500 mg via ORAL
  Filled 2013-04-11 (×2): qty 1

## 2013-04-11 NOTE — Progress Notes (Signed)
PULMONARY  / CRITICAL CARE MEDICINE  Name: [name] [name] MRN: [id number] DOB: [birth year]    ADMISSION DATE:  04/07/2013 CONSULTATION DATE:  4/30  REFERRING MD :  vega PRIMARY SERVICE: [facility]-->PCCM  CHIEF COMPLAINT:  Dyspnea/septic shock  BRIEF PATIENT DESCRIPTION:  79 year old male admitted on 4/28 for what eventually felt to be PNA after he failed to respond to diuretics and treatment for heart failure. PCCM asked to see on 4/30 for severe sepsis/septic shock. BC + for GNRs 5/1  SIGNIFICANT EVENTS / STUDIES:    LINES / TUBES: Right IJ CVL 4/30 >> 5/2  CULTURES: u strep 4/30>>>NEG u legionella 4/30>>> NEG Blood 4/30 >> 2/2 GNRs  ANTIBIOTICS: vanc 4/30>>> 5/1 Zosyn 4/30>>> levaquin 4/30>>>   SUBJECTIVE: Feels better. Reports RUQ pain  VITAL SIGNS: Temp:  [97.8 F (36.6 C)-98.2 F (36.8 C)] 97.9 F (36.6 C) (05/02 1141) Pulse Rate:  [60-72] 64 (05/02 1000) Resp:  [16-24] 20 (05/02 1200) BP: (87-140)/(30-105) 102/47 mmHg (05/02 1200) SpO2:  [95 %-99 %] 96 % (05/02 1300) HEMODYNAMICS:   VENTILATOR SETTINGS:   INTAKE / OUTPUT: Intake/Output     05/01 0701 - 05/02 0700 05/02 0701 - 05/03 0700   P.O.     I.V. (mL/kg) 80 (1.2)    IV Piggyback 200 50   Total Intake(mL/kg) 280 (4.2) 50 (0.8)   Urine (mL/kg/hr) 1085 (0.7) 900 (1.8)   Stool 1 (0)    Total Output 1086 900   Net -806 -850        Urine Occurrence  1 x   Stool Occurrence  1 x     PHYSICAL EXAMINATION: General:  Frail but NAD Neuro:  Awake, oriented  HEENT: WNL Cardiovascular: RRR s M Lungs: Clear anteriorly Abdomen:  Soft, not tender  Ext: warm, no edema Skin:  Scattered areas of ecchymosis   LABS:  Recent Labs Lab 04/08/13 0655 04/09/13 0420 04/11/13 0432  NA 138 135 135  K 4.1 3.5 3.6  CL 97 96 95*  CO2 31 28 27   BUN 42* 55* 82*  CREATININE 2.18* 3.22* 3.62*  GLUCOSE 185* 127* 152*    Recent Labs Lab 04/09/13 1422 04/10/13 0500 04/11/13 0432  HGB 10.2* 10.0* 10.0*  HCT  30.2* 29.3* 29.1*  WBC 15.0* 8.9 7.2  PLT 81* 63* 62*    Recent Labs Lab 04/08/13 0655 04/09/13 1422 04/09/13 1500 04/09/13 1551 04/10/13 0500 04/11/13 0432  PROCALCITON  --   --   --  51.51 41.58 27.28  WBC 11.6* 15.0*  --   --  8.9 7.2  LATICACIDVEN  --   --  2.9*  --   --   --      Recent Labs Lab 04/10/13 1155 04/10/13 1555 04/10/13 2231 04/11/13 0751 04/11/13 1124  GLUCAP 171* 177* 144* 151* 142*    CXR: NSC retrocardiac opacity  ASSESSMENT / PLAN:  PULMONARY A: Hypoxic resp failure ,improving - suspect LLL pneumonia  (but has chronic LLL changes on CXR)     P:   Cont supplemental oxygen   CARDIOVASCULAR A:  Septic shock, resolved Cardiomyopathy (EF 25%) P:  Cont to monitor Cards input appreciate  RENAL A:  Acute renal failure, nonoliguric P:   Cont to monitor BMET  GASTROINTESTINAL A:  No acute issues P:   Cont diet No indication for SUP but uses PPI @ home - continue  HEMATOLOGIC A:  Chronic anemia P:  Trend CBC Cont [place] heparin  INFECTIOUS A:   Suspected pneumonia  Severe Sepsis  GNR bacteremia  RUQ pain - consider GB disease P:   Micro and abx as above Narrow coverage as indicated by sensitivities Check LFTs - if abnormal, would consider GB US   ENDOCRINE A: hyperglycemia without hx of DM P:   Cont SSI   NEUROLOGIC A:  No abn P:   Monitor   Transfer to Tele 5/2. TRH to assume care as of 5/3 (discussed with Dr Sharon Seller)  Billy Fischer, MD ; [facility] [phone].  After 5:30 PM or weekends, call [phone]

## 2013-04-11 NOTE — Evaluation (Signed)
Physical Therapy Evaluation Patient Details Name: [name] [name] MRN: [id number] DOB: 07-12-1926 Today's Date: 04/11/2013 Time: 4098-1191 PT Time Calculation (min): 19 min  PT Assessment / Plan / Recommendation Clinical Impression  Patient is an 79 yo male admitted with abdominal/chest pain, sepsis, dyspnea.  Patient was very mobile pta, ambulating up/down 17 stairs daily to get into/out of apartment.  Now requires mod to max assist with all mobility.  Unable to ambulate more than 3'.  Will benefit from acute PT to maximize independence prior to discharge.  Recommend Inpatient Rehab consult for comprehensive therapies prior to return home with wife.    PT Assessment  Patient needs continued PT services    Follow Up Recommendations  CIR    Does the patient have the potential to tolerate intense rehabilitation      Barriers to Discharge Inaccessible home environment Has 17 steps to enter apartment    Equipment Recommendations  Rolling walker with 5" wheels    Recommendations for Other Services Rehab consult   Frequency Min 3X/week    Precautions / Restrictions Precautions Precautions: Fall Restrictions Weight Bearing Restrictions: No   Pertinent Vitals/Pain       Mobility  Bed Mobility Bed Mobility: Rolling Right;Right Sidelying to Sit;Sitting - Scoot to [place] of Bed;Sit to Supine Rolling Right: 3: Mod assist;With rail Right Sidelying to Sit: 3: Mod assist;With rails Sitting - Scoot to Edge of Bed: 4: Min guard Sit to Supine: 3: Mod assist Details for Bed Mobility Assistance: Verbal and tactile cues for rolling and moving to sitting.  Assist required due to pain in posterior neck with mobility.  Once upright, able to scoot to EOB with standby assist.  Required mod assist to return to supine again due to pain in neck. Transfers Transfers: Sit to Stand;Stand to Sit Sit to Stand: 3: Mod assist;With upper extremity assist;From bed Stand to Sit: 3: Mod assist;With upper  extremity assist;To bed Details for Transfer Assistance: Verbal cues for hand placement.  Required mod assist to rise to standing.  Unable to reach fully upright position, with hips, knees, and trunk remaining flexed.  Performed sit to stand x2. Ambulation/Gait Ambulation/Gait Assistance: 3: Mod assist Ambulation Distance (Feet): 3 Feet Assistive device: 1 person hand held assist Ambulation/Gait Assistance Details: Verbal cues to stand upright - patient unable.  Declined use of RW.  Attempted gait with hand-hold assist.  Patient only able to step 3', and the backward to bed.  Balance decreased in standing. Gait Pattern: Step-to pattern;Decreased step length - right;Decreased step length - left;Decreased stride length;Right flexed knee in stance;Left flexed knee in stance;Shuffle;Trunk flexed Gait velocity: Slow    Exercises     PT Diagnosis: Difficulty walking;Generalized weakness;Acute pain  PT Problem List: Decreased strength;Decreased activity tolerance;Decreased balance;Decreased mobility;Decreased knowledge of use of DME;Cardiopulmonary status limiting activity;Pain PT Treatment Interventions: DME instruction;Gait training;Functional mobility training;Therapeutic exercise;Balance training;Patient/family education   PT Goals Acute Rehab PT Goals PT Goal Formulation: With patient Time For Goal Achievement: 04/25/13 Potential to Achieve Goals: Good Pt will Roll Supine to Right Side: with modified independence;with rail PT Goal: Rolling Supine to Right Side - Progress: Goal set today Pt will go Supine/Side to Sit: with modified independence;with HOB 0 degrees;with rail PT Goal: Supine/Side to Sit - Progress: Goal set today Pt will go Sit to Supine/Side: with modified independence;with HOB 0 degrees;with rail PT Goal: Sit to Supine/Side - Progress: Goal set today Pt will go Sit to Stand: with supervision;with upper extremity assist PT Goal: Sit to  Stand - Progress: Goal set today Pt will  Ambulate: 51 - 150 feet;with supervision;with rolling walker PT Goal: Ambulate - Progress: Goal set today  Visit Information  Last PT Received On: 04/11/13 Assistance Needed: +2    Subjective Data  Subjective: I think I'll be OK when I get home.  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    Copywriter, advertising Arousal/Alertness: Awake/alert Behavior During Therapy: WFL for tasks assessed/performed Overall Cognitive Status: Within Functional Limits for tasks assessed    Extremity/Trunk Assessment Right Upper Extremity Assessment RUE ROM/Strength/Tone: [facility] for tasks assessed Left Upper Extremity Assessment LUE ROM/Strength/Tone: WFL for tasks assessed Right Lower Extremity Assessment RLE ROM/Strength/Tone: Deficits RLE ROM/Strength/Tone Deficits: Strength grossly 4-/5 Left Lower Extremity Assessment LLE ROM/Strength/Tone: Deficits LLE ROM/Strength/Tone Deficits: Strength grossly 4-/5 Trunk Assessment Trunk Assessment: Kyphotic   Balance Balance Balance Assessed: Yes Static Sitting Balance Static Sitting - Balance Support: No upper extremity supported;Feet supported Static Sitting - Level of Assistance: 5: Stand by assistance Static Sitting - Comment/# of Minutes: 5 Static Standing Balance Static Standing - Balance Support: Bilateral upper extremity supported Static Standing - Level of Assistance: 3: Mod assist Static Standing - Comment/# of Minutes: 2 minutes, with very flexed posture.  End of Session PT - End  of Session Equipment Utilized During Treatment: Gait belt;Oxygen Activity Tolerance: Patient limited by fatigue;Patient limited by pain Patient left: in bed;with call bell/phone within reach Nurse Communication: Mobility status  GP     Vena Austria 04/11/2013, 7:24 PM Durenda Hurt. Renaldo Fiddler, [facility] Acute Rehab Services Pager [phone]

## 2013-04-11 NOTE — Progress Notes (Signed)
Patient has arrived to unit, report received for 2100 nurse; will continue to monitor patient. Lorretta Harp RN

## 2013-04-11 NOTE — Care Management Note (Signed)
    Page 1 of 1   04/11/2013     12:19:15 PM   CARE MANAGEMENT NOTE 04/11/2013  Patient:  [name] [name], [name] [name]   Account Number:  [id number]  Date Initiated:  04/09/2013  Documentation initiated by:  Alvira Philips Assessment:   79 yr-old male adm with dx of systolic failure; lives with spouse, has h/o home health services through [facility]     Action/Plan:   Anticipated DC Date:  04/15/2013   Anticipated DC Plan:  HOME W HOME HEALTH SERVICES      DC Planning Services  CM consult      Choice offered to / List presented to:             Status of service:  In process, will continue to follow Medicare Important Message given?   (If response is "NO", the following Medicare IM given date fields will be blank) Date Medicare IM given:   Date Additional Medicare IM given:    Discharge Disposition:    Per UR Regulation:  Reviewed for med. necessity/level of care/duration of stay  If discussed at Long Length of Stay Meetings, dates discussed:    Comments:  ContactNabor, Thomann Spouse [phone]                Haig, Gerardo     [phone]  PCP: Dr Bufford Spikes  04-11-13 11:53am Avie Arenas, RNBSN [phone] Tx to ICU on 4-30 with sepsis - hypotensive - on IV antibiotics - Fluid bolused.  Talked with wife and two daughters in room.  Patient lives with wife at home in an apt that has about 16-17 steps to get into.  Has home oxygen with [facility].  He is able to go up and down stairs frequently at home prior to coming in.  Wife and patient both agree that they do get alone fine at home and would like to return to that level.  PT/OT ordered.  Both concerned about getting weaker and needing to get strength back up.

## 2013-04-12 DIAGNOSIS — R7881 Bacteremia: Secondary | ICD-10-CM

## 2013-04-12 DIAGNOSIS — E785 Hyperlipidemia, unspecified: Secondary | ICD-10-CM

## 2013-04-12 DIAGNOSIS — I10 Essential (primary) hypertension: Secondary | ICD-10-CM

## 2013-04-12 DIAGNOSIS — B9689 Other specified bacterial agents as the cause of diseases classified elsewhere: Secondary | ICD-10-CM

## 2013-04-12 LAB — COMPREHENSIVE METABOLIC PANEL
ALT: 128 U/L — ABNORMAL HIGH (ref 0–53)
AST: 39 U/L — ABNORMAL HIGH (ref 0–37)
Albumin: 2.1 g/dL — ABNORMAL LOW (ref 3.5–5.2)
CO2: 25 mEq/L (ref 19–32)
Calcium: 9.1 mg/dL (ref 8.4–10.5)
GFR calc non Af Amer: 15 mL/min — ABNORMAL LOW (ref >90)
Sodium: 137 mEq/L (ref 135–145)
Total Protein: 6 g/dL (ref 6.0–8.3)

## 2013-04-12 LAB — CULTURE, BLOOD (ROUTINE X 2)

## 2013-04-12 LAB — GLUCOSE, CAPILLARY
Glucose-Capillary: 183 mg/dL — ABNORMAL HIGH (ref 70–99)
Glucose-Capillary: 130 mg/dL — ABNORMAL HIGH (ref 70–99)

## 2013-04-12 MED ORDER — LEVOFLOXACIN 500 MG PO TABS
500.0000 mg | ORAL_TABLET | ORAL | Status: DC
  Filled 2013-04-12: qty 1

## 2013-04-12 MED ORDER — TRAMADOL HCL 50 MG PO TABS
25.0000 mg | ORAL_TABLET | Freq: Four times a day (QID) | ORAL | Status: DC | PRN
  Administered 2013-04-13 – 2013-04-15 (×4): 25 mg via ORAL
  Filled 2013-04-12 (×3): qty 1

## 2013-04-12 NOTE — Progress Notes (Signed)
[facility] PROGRESS NOTE  [name] [name] FAO:[id number] DOB: 1926/11/01 DOA: 04/07/2013 PCP: Bufford Spikes, DO  Assessment/Plan: 1. Sepsis - Improving on Levaquin and Zosyn - Growing E coli in blood. - VSS - hypotension resolved  2. Bacteremia - Growing E coli with sensitivities pending - Continue zosyn and levaquin  3. Chronic systolic heart failure -  On Imdur - B blocker and lasix on hold due to recent hypotension from sepsis.  Once blood pressures more steady will plan on continuing home regimen.  4. CAD - continue statin  5. Acute on chronic renal failure - creatinine improving.  Will continue to monitor S creatinine - most likely due to hypotension related to Sepsis  Code Status: DNR Family Communication: discussed with family at bedside Disposition Plan: Pending continued improvement in condition.  PT recommends CIR.  Consult placed for CIR evaluation   Consultants:  Onalee Hua simonds: critical care  Procedures:  Central line  Antibiotics:  Levaquin  Zosyn  HPI/Subjective: Only complaint  Objective: Filed Vitals:   04/11/13 1531 04/11/13 1959 04/11/13 2328 04/12/13 0455  BP: 114/58 132/49 132/50 147/73  Pulse:  64 65 68  Temp: 97.7 F (36.5 C) 97.3 F (36.3 C)  97.4 F (36.3 C)  TempSrc: Oral Oral  Axillary  Resp: 20 18 16 18   Height:      Weight: 65.318 kg (144 lb)   64.456 kg (142 lb 1.6 oz)  SpO2: 100% 100% 99% 98%    Intake/Output Summary (Last 24 hours) at 04/12/13 1211 Last data filed at 04/12/13 0900  Gross per 24 hour  Intake    810 ml  Output    400 ml  Net    410 ml   Filed Weights   04/10/13 0500 04/11/13 1531 04/12/13 0455  Weight: 66 kg (145 lb 8.1 oz) 65.318 kg (144 lb) 64.456 kg (142 lb 1.6 oz)    Exam:   General:  Pt in nad, alert and awake  Cardiovascular: RRR, no MRG  Respiratory: no wheezes, mild rhales  Abdomen: soft, NT, ND  Musculoskeletal: no cyanosis or clubbing   Data Reviewed: Basic  Metabolic Panel:  Recent Labs Lab 04/07/13 1702 04/08/13 0114 04/08/13 0655 04/09/13 0420 04/11/13 0432 04/12/13 0430  NA 140  --  138 135 135 137  K 4.5  --  4.1 3.5 3.6 3.5  CL 99  --  97 96 95* 98  CO2 32  --  31 28 27 25   GLUCOSE 150*  --  185* 127* 152* 160*  BUN 42*  --  42* 55* 82* 90*  CREATININE 2.27* 2.19* 2.18* 3.22* 3.62* 3.35*  CALCIUM 10.3  --  9.4 8.9 9.0 9.1   Liver Function Tests:  Recent Labs Lab 04/07/13 1702 04/11/13 0432 04/12/13 0430  AST 19 97* 39*  ALT 16 181* 128*  ALKPHOS 109 72 100  BILITOT 0.5 0.7 0.7  PROT 7.9 6.0 6.0  ALBUMIN 3.5 2.2* 2.1*   No results found for this basename: LIPASE, AMYLASE,  in the last 168 hours No results found for this basename: AMMONIA,  in the last 168 hours CBC:  Recent Labs Lab 04/07/13 1702 04/08/13 0114 04/08/13 0655 04/09/13 1422 04/10/13 0500 04/11/13 0432  WBC 5.9 9.5 11.6* 15.0* 8.9 7.2  NEUTROABS 3.5  --   --  13.7* 8.1*  --   HGB 12.0* 11.8* 11.7* 10.2* 10.0* 10.0*  HCT 35.7* 35.4* 35.4* 30.2* 29.3* 29.1*  MCV 88.1 88.3 88.5 87.8 87.2 85.3  PLT 134*  123* 129* 81* 63* 62*   Cardiac Enzymes:  Recent Labs Lab 04/08/13 0114 04/08/13 0655 04/08/13 1548  TROPONINI <0.30 <0.30 <0.30   BNP (last 3 results)  Recent Labs  07/23/12 0433 07/31/12 0842 04/07/13 1706  PROBNP 30452.0* 1518.0* 16363.0*   CBG:  Recent Labs Lab 04/11/13 0751 04/11/13 1124 04/11/13 1654 04/11/13 2118 04/12/13 0552  GLUCAP 151* 142* 110* 174* 150*    Recent Results (from the past 240 hour(s))  MRSA PCR SCREENING     Status: None   Collection Time    04/08/13  1:49 AM      Result Value Range Status   MRSA by PCR NEGATIVE  NEGATIVE Final   Comment:            The GeneXpert MRSA Assay (FDA     approved for NASAL specimens     only), is one component of a     comprehensive MRSA colonization     surveillance program. It is not     intended to diagnose MRSA     infection nor to guide or     monitor  treatment for     MRSA infections.  CULTURE, BLOOD (ROUTINE X 2)     Status: None   Collection Time    04/09/13  2:00 PM      Result Value Range Status   Specimen Description BLOOD RIGHT HAND   Final   Special Requests BOTTLES DRAWN AEROBIC AND ANAEROBIC 10CC   Final   Culture  Setup Time 04/09/2013 18:46   Final   Culture     Final   Value: ESCHERICHIA COLI     Note: Gram Stain Report Called to,Read Back By and Verified With: ZYRIL AT 0445 04/10/13 SNOLO   Report Status 04/12/2013 FINAL   Final   Organism ID, Bacteria ESCHERICHIA COLI   Final  CULTURE, BLOOD (ROUTINE X 2)     Status: None   Collection Time    04/09/13  2:08 PM      Result Value Range Status   Specimen Description BLOOD RIGHT FOREARM   Final   Special Requests BOTTLES DRAWN AEROBIC ONLY 10CC   Final   Culture  Setup Time 04/09/2013 18:46   Final   Culture     Final   Value: ESCHERICHIA COLI     Note: SUSCEPTIBILITIES PERFORMED ON PREVIOUS CULTURE WITHIN THE LAST 5 DAYS.     Note: Gram Stain Report Called to,Read Back By and Verified With: ZYRIL AT 0446 04/10/13 BY SNOLO   Report Status 04/12/2013 FINAL   Final     Studies: Dg Chest Port 1 View  04/11/2013  *RADIOLOGY REPORT*  Clinical Data: Evaluate respiratory failure  PORTABLE CHEST - 1 VIEW  Comparison: 04/09/2013; 04/07/2013  Findings:  Grossly unchanged cardiac silhouette and mediastinal contours post median sternotomy, CABG and valve replacement.  Stable positioning of support apparatus.  Pulmonary vasculature remains indistinct with cephalization of flow.  Grossly unchanged bibasilar heterogeneous opacities, left greater than right.  Small left-sided effusion is grossly unchanged given slightly reduced lung volumes. No definite pneumothorax.  Unchanged bones.  IMPRESSION: 1.  Grossly unchanged findings of mild pulmonary edema and bibasilar opacities, left greater than right, atelectasis versus infiltrate.  2.  Likely unchanged chronic left-sided pleural  effusion/pleural parenchymal thickening.   Original Report Authenticated By: Tacey Ruiz, MD     Scheduled Meds: . aspirin EC  325 mg Oral q morning - 10a  . cholecalciferol  1,000 Units  Oral q morning - 10a  . insulin aspart  0-15 Units Subcutaneous TID WC  . insulin aspart  0-5 Units Subcutaneous QHS  . isosorbide mononitrate  60 mg Oral Daily  . [START ON 04/13/2013] levofloxacin  500 mg Oral Q48H  . pantoprazole  40 mg Oral Daily  . piperacillin-tazobactam (ZOSYN)  IV  2.25 g Intravenous Q6H  . tamsulosin  0.4 mg Oral QPC supper  . Travoprost (BAK Free)  1 drop Both Eyes BID   Continuous Infusions:   Principal Problem:   Sepsis Active Problems:   Acute on chronic renal failure   CAD   Chronic systolic heart failure   Sepsis associated hypotension   Hypotension, unspecified   Bacteremia due to Gram-negative bacteria    Time spent: > 35 minutes    Penny Pia  [facility] Pager [phone]. If 7PM-7AM, please contact night-coverage at [email or web address], password [facility] 04/12/2013, 12:11 PM  LOS: 5 days

## 2013-04-12 NOTE — Progress Notes (Signed)
ANTIBIOTIC CONSULT NOTE - FOLLOW UP  Pharmacy Consult for Zoysn Indication: pneumonia and bacteremia coverage  Allergies  Allergen Reactions  . Statins     Pain/weakness in legs    Patient Measurements: Height: 5\' 8"  (172.7 cm) Weight: 142 lb 1.6 oz (64.456 kg) IBW/kg (Calculated) : 68.4  Vital Signs: Temp: 97.4 F (36.3 C) (05/03 0455) Temp src: Axillary (05/03 0455) BP: 147/73 mmHg (05/03 0455) Pulse Rate: 68 (05/03 0455) Intake/Output from previous day: 05/02 0701 - 05/03 0700 In: 620 [P.O.:420; IV Piggyback:200] Out: 1300 [Urine:1300]  Labs:  Recent Labs  04/09/13 1422 04/10/13 0500 04/11/13 0432 04/12/13 0430  WBC 15.0* 8.9 7.2  --   HGB 10.2* 10.0* 10.0*  --   PLT 81* 63* 62*  --   CREATININE  --   --  3.62* 3.35*   Estimated Creatinine Clearance: 14.7 ml/min (by C-G formula based on Cr of 3.35).  Microbiology: Recent Results (from the past 720 hour(s))  MRSA PCR SCREENING     Status: None   Collection Time    04/08/13  1:49 AM      Result Value Range Status   MRSA by PCR NEGATIVE  NEGATIVE Final   Comment:            The GeneXpert MRSA Assay (FDA     approved for NASAL specimens     only), is one component of a     comprehensive MRSA colonization     surveillance program. It is not     intended to diagnose MRSA     infection nor to guide or     monitor treatment for     MRSA infections.  CULTURE, BLOOD (ROUTINE X 2)     Status: None   Collection Time    04/09/13  2:00 PM      Result Value Range Status   Specimen Description BLOOD RIGHT HAND   Final   Special Requests BOTTLES DRAWN AEROBIC AND ANAEROBIC 10CC   Final   Culture  Setup Time 04/09/2013 18:46   Final   Culture     Final   Value: ESCHERICHIA COLI     Note: Gram Stain Report Called to,Read Back By and Verified With: ZYRIL AT 0445 04/10/13 SNOLO   Report Status 04/12/2013 FINAL   Final   Organism ID, Bacteria ESCHERICHIA COLI   Final  CULTURE, BLOOD (ROUTINE X 2)     Status: None   Collection Time    04/09/13  2:08 PM      Result Value Range Status   Specimen Description BLOOD RIGHT FOREARM   Final   Special Requests BOTTLES DRAWN AEROBIC ONLY 10CC   Final   Culture  Setup Time 04/09/2013 18:46   Final   Culture     Final   Value: ESCHERICHIA COLI     Note: SUSCEPTIBILITIES PERFORMED ON PREVIOUS CULTURE WITHIN THE LAST 5 DAYS.     Note: Gram Stain Report Called to,Read Back By and Verified With: ZYRIL AT 0446 04/10/13 BY SNOLO   Report Status 04/12/2013 FINAL   Final   Assessment:  Creatinine trending down some, but still elevated. BUN up.  E coli in blood cultures, sensitive to Zosyn.    Day # 4 Zosyn and Levaquin. Levaquin changed to 500 mg PO daily on 5/2, but had been on 500 mg IV q48hrs.  Received  Vancomycin x 1 dose on 4/30; discontinued 5/1.   Low platelet count. SQ heparin changed to SCDs on 5/1.  Goal of Therapy:  appropriate antibiotic doses for renal function and infection  Plan:   Continue Zosyn 2.25 grams IV q6hrs.  Levaquin adjusted to 500 mg PO q48hrs for current renal function.   CBC and bmet in am.  Dennie Fetters, RPh Pager: [phone] 04/12/2013,12:58 PM

## 2013-04-13 LAB — GLUCOSE, CAPILLARY
Glucose-Capillary: 142 mg/dL — ABNORMAL HIGH (ref 70–99)
Glucose-Capillary: 241 mg/dL — ABNORMAL HIGH (ref 70–99)
Glucose-Capillary: 133 mg/dL — ABNORMAL HIGH (ref 70–99)

## 2013-04-13 LAB — CBC
HCT: 31 % — ABNORMAL LOW (ref 39.0–52.0)
Hemoglobin: 10.7 g/dL — ABNORMAL LOW (ref 13.0–17.0)
MCV: 84.7 fL (ref 78.0–100.0)
RBC: 3.66 MIL/uL — ABNORMAL LOW (ref 4.22–5.81)
WBC: 6.6 10*3/uL (ref 4.0–10.5)

## 2013-04-13 LAB — BASIC METABOLIC PANEL
BUN: 87 mg/dL — ABNORMAL HIGH (ref 6–23)
CO2: 26 mEq/L (ref 19–32)
Chloride: 100 mEq/L (ref 96–112)
Creatinine, Ser: 3.31 mg/dL — ABNORMAL HIGH (ref 0.50–1.35)
Glucose, Bld: 160 mg/dL — ABNORMAL HIGH (ref 70–99)

## 2013-04-13 MED ORDER — CIPROFLOXACIN HCL 500 MG PO TABS
500.0000 mg | ORAL_TABLET | ORAL | Status: DC
  Administered 2013-04-13 – 2013-04-15 (×3): 500 mg via ORAL
  Filled 2013-04-13 (×3): qty 1

## 2013-04-13 NOTE — Progress Notes (Signed)
ANTIBIOTIC CONSULT NOTE - FOLLOW UP  Pharmacy Consult for Cipro Indication: pneumonia and bacteremia coverage  Allergies  Allergen Reactions  . Statins     Pain/weakness in legs    Patient Measurements: Height: 5\' 8"  (172.7 cm) Weight: 140 lb 10.5 oz (63.8 kg) IBW/kg (Calculated) : 68.4  Vital Signs: Temp: 97.6 F (36.4 C) (05/04 0502) Temp src: Oral (05/04 0502) BP: 150/82 mmHg (05/04 0502) Pulse Rate: 68 (05/04 0502) Intake/Output from previous day: 05/03 0701 - 05/04 0700 In: 600 [P.O.:600] Out: 1401 [Urine:1400; Stool:1]  Labs:  Recent Labs  04/11/13 0432 04/12/13 0430 04/13/13 0555  WBC 7.2  --  6.6  HGB 10.0*  --  10.7*  PLT 62*  --  66*  CREATININE 3.62* 3.35* 3.31*   Estimated Creatinine Clearance: 14.7 ml/min (by C-G formula based on Cr of 3.31).  Microbiology:   E coli in 2 of 2 blood cultures from 04/09/13.      Sensitive to multiple agents, including Cipro.  Assessment:  Creatinine trending down some, but still elevated. BUN up.  Day # 5 antibiotics. Zosyn and Levaquin changing to Cipro alone.  Afebrile, WBC down to 6.6.   Platelet count remains low but no further drop. Low baseline of 129K on admission.  SQ heparin changed to SCDs on 5/1. No bleeding noted.  Goal of Therapy:  appropriate antibiotic dose for renal function and infection  Plan:   Cipro 500 mg PO daily.  Will follow renal function for any need to adjust (would need q12hr dosing if crcl >30 ml/min)   Follow up platelet count with you.  Dennie Fetters, RPh Pager: [phone] 04/13/2013,11:00 AM

## 2013-04-13 NOTE — Progress Notes (Signed)
[facility] PROGRESS NOTE  [name] [name] AVW:[id number] DOB: 07/18/1926 DOA: 04/07/2013 PCP: Bufford Spikes, DO  Assessment/Plan: 1. Sepsis - Improving on Levaquin and Zosyn - Growing E coli in blood. - VSS - hypotension resolved  2. Bacteremia - Growing E coli pan sensitive - place on cipro. Pharmacy to dose  3. Chronic systolic heart failure -  On Imdur - B blocker and lasix on hold due to recent hypotension from sepsis.  Once blood pressures more steady will plan on continuing home regimen.  4. CAD - continue statin  5. Acute on chronic renal failure - creatinine improving.  Will continue to monitor S creatinine - most likely due to hypotension related to Sepsis  Code Status: DNR Family Communication: discussed with family at bedside Disposition Plan: Pending continued improvement in condition.  PT recommends CIR.  Consult placed for CIR evaluation   Consultants:  Onalee Hua simonds: critical care  Procedures:  Central line  Antibiotics:  Levaquin>>>5/4  Zosyn>>>5/4  HPI/Subjective: Has been complaining of neck stiffness.  But patient mentions that he feels better today.  He feels weak  Objective: Filed Vitals:   04/12/13 0455 04/12/13 1430 04/12/13 1954 04/13/13 0502  BP: 147/73 140/58 146/70 150/82  Pulse: 68 56 60 68  Temp: 97.4 F (36.3 C) 97.7 F (36.5 C) 97.6 F (36.4 C) 97.6 F (36.4 C)  TempSrc: Axillary Oral Oral Oral  Resp: 18 18 16 18   Height:      Weight: 64.456 kg (142 lb 1.6 oz)   63.8 kg (140 lb 10.5 oz)  SpO2: 98% 100% 100% 97%    Intake/Output Summary (Last 24 hours) at 04/13/13 1313 Last data filed at 04/13/13 0500  Gross per 24 hour  Intake    360 ml  Output   1401 ml  Net  -1041 ml   Filed Weights   04/11/13 1531 04/12/13 0455 04/13/13 0502  Weight: 65.318 kg (144 lb) 64.456 kg (142 lb 1.6 oz) 63.8 kg (140 lb 10.5 oz)    Exam:   General:  Pt in nad, alert and awake  Cardiovascular: RRR, no MRG  Respiratory:  no wheezes, mild rhales  Abdomen: soft, NT, ND  Musculoskeletal: no cyanosis or clubbing  Data Reviewed: Basic Metabolic Panel:  Recent Labs Lab 04/08/13 0655 04/09/13 0420 04/11/13 0432 04/12/13 0430 04/13/13 0555  NA 138 135 135 137 136  K 4.1 3.5 3.6 3.5 3.3*  CL 97 96 95* 98 100  CO2 31 28 27 25 26   GLUCOSE 185* 127* 152* 160* 160*  BUN 42* 55* 82* 90* 87*  CREATININE 2.18* 3.22* 3.62* 3.35* 3.31*  CALCIUM 9.4 8.9 9.0 9.1 9.0   Liver Function Tests:  Recent Labs Lab 04/07/13 1702 04/11/13 0432 04/12/13 0430  AST 19 97* 39*  ALT 16 181* 128*  ALKPHOS 109 72 100  BILITOT 0.5 0.7 0.7  PROT 7.9 6.0 6.0  ALBUMIN 3.5 2.2* 2.1*   No results found for this basename: LIPASE, AMYLASE,  in the last 168 hours No results found for this basename: AMMONIA,  in the last 168 hours CBC:  Recent Labs Lab 04/07/13 1702  04/08/13 0655 04/09/13 1422 04/10/13 0500 04/11/13 0432 04/13/13 0555  WBC 5.9  < > 11.6* 15.0* 8.9 7.2 6.6  NEUTROABS 3.5  --   --  13.7* 8.1*  --   --   HGB 12.0*  < > 11.7* 10.2* 10.0* 10.0* 10.7*  HCT 35.7*  < > 35.4* 30.2* 29.3* 29.1* 31.0*  MCV 88.1  < >  88.5 87.8 87.2 85.3 84.7  PLT 134*  < > 129* 81* 63* 62* 66*  < > = values in this interval not displayed. Cardiac Enzymes:  Recent Labs Lab 04/08/13 0114 04/08/13 0655 04/08/13 1548  TROPONINI <0.30 <0.30 <0.30   BNP (last 3 results)  Recent Labs  07/23/12 0433 07/31/12 0842 04/07/13 1706  PROBNP 30452.0* 1518.0* 16363.0*   CBG:  Recent Labs Lab 04/12/13 0552 04/12/13 1134 04/12/13 1631 04/12/13 2106 04/13/13 0536  GLUCAP 150* 155* 183* 130* 142*    Recent Results (from the past 240 hour(s))  MRSA PCR SCREENING     Status: None   Collection Time    04/08/13  1:49 AM      Result Value Range Status   MRSA by PCR NEGATIVE  NEGATIVE Final   Comment:            The GeneXpert MRSA Assay (FDA     approved for NASAL specimens     only), is one component of a      comprehensive MRSA colonization     surveillance program. It is not     intended to diagnose MRSA     infection nor to guide or     monitor treatment for     MRSA infections.  CULTURE, BLOOD (ROUTINE X 2)     Status: None   Collection Time    04/09/13  2:00 PM      Result Value Range Status   Specimen Description BLOOD RIGHT HAND   Final   Special Requests BOTTLES DRAWN AEROBIC AND ANAEROBIC 10CC   Final   Culture  Setup Time 04/09/2013 18:46   Final   Culture     Final   Value: ESCHERICHIA COLI     Note: Gram Stain Report Called to,Read Back By and Verified With: ZYRIL AT 0445 04/10/13 SNOLO   Report Status 04/12/2013 FINAL   Final   Organism ID, Bacteria ESCHERICHIA COLI   Final  CULTURE, BLOOD (ROUTINE X 2)     Status: None   Collection Time    04/09/13  2:08 PM      Result Value Range Status   Specimen Description BLOOD RIGHT FOREARM   Final   Special Requests BOTTLES DRAWN AEROBIC ONLY 10CC   Final   Culture  Setup Time 04/09/2013 18:46   Final   Culture     Final   Value: ESCHERICHIA COLI     Note: SUSCEPTIBILITIES PERFORMED ON PREVIOUS CULTURE WITHIN THE LAST 5 DAYS.     Note: Gram Stain Report Called to,Read Back By and Verified With: ZYRIL AT 0446 04/10/13 BY SNOLO   Report Status 04/12/2013 FINAL   Final     Studies: No results found.  Scheduled Meds: . aspirin EC  325 mg Oral q morning - 10a  . cholecalciferol  1,000 Units Oral q morning - 10a  . ciprofloxacin  500 mg Oral Q24H  . insulin aspart  0-15 Units Subcutaneous TID WC  . insulin aspart  0-5 Units Subcutaneous QHS  . isosorbide mononitrate  60 mg Oral Daily  . pantoprazole  40 mg Oral Daily  . tamsulosin  0.4 mg Oral QPC supper  . Travoprost (BAK Free)  1 drop Both Eyes BID   Continuous Infusions:   Principal Problem:   Sepsis Active Problems:   Acute on chronic renal failure   CAD   Chronic systolic heart failure   Sepsis associated hypotension   Hypotension, unspecified   Bacteremia  due to  Gram-negative bacteria    Time spent: > 35 minutes    Penny Pia  [facility] Pager [phone]. If 7PM-7AM, please contact night-coverage at [email or web address], password [facility] 04/13/2013, 1:13 PM  LOS: 6 days

## 2013-04-14 DIAGNOSIS — E876 Hypokalemia: Secondary | ICD-10-CM

## 2013-04-14 DIAGNOSIS — I509 Heart failure, unspecified: Secondary | ICD-10-CM

## 2013-04-14 DIAGNOSIS — R5381 Other malaise: Secondary | ICD-10-CM

## 2013-04-14 LAB — GLUCOSE, CAPILLARY
Glucose-Capillary: 168 mg/dL — ABNORMAL HIGH (ref 70–99)
Glucose-Capillary: 178 mg/dL — ABNORMAL HIGH (ref 70–99)

## 2013-04-14 MED ORDER — POTASSIUM CHLORIDE CRYS ER 20 MEQ PO TBCR
40.0000 meq | EXTENDED_RELEASE_TABLET | Freq: Once | ORAL | Status: AC
  Administered 2013-04-14: 40 meq via ORAL
  Filled 2013-04-14: qty 2

## 2013-04-14 MED ORDER — CYCLOBENZAPRINE HCL 5 MG PO TABS
5.0000 mg | ORAL_TABLET | Freq: Every day | ORAL | Status: DC
  Administered 2013-04-14: 5 mg via ORAL
  Filled 2013-04-14 (×2): qty 1

## 2013-04-14 NOTE — Evaluation (Signed)
Occupational Therapy Evaluation Patient Details Name: [name] [name] MRN: [id number] DOB: 08-09-1926 Today's Date: 04/14/2013 Time: 7829-5621 OT Time Calculation (min): 40 min  OT Assessment / Plan / Recommendation Clinical Impression  This 79 yo male admitted with CP and dyspnea and found to have mild congestive heart failure and interstitial pulmonary edema, and sepsis presents to acute OT with problems below. Will benefit from acute OT with follow up on CIR.    OT Assessment  Patient needs continued OT Services    Follow Up Recommendations  CIR    Barriers to Discharge None    Equipment Recommendations   (TBD)       Frequency  Min 2X/week    Precautions / Restrictions Precautions Precautions: Fall Precaution Comments: spams in neck and right hip with some movements Restrictions Weight Bearing Restrictions: No   Pertinent Vitals/Pain 10/10 in neck and right hip with certain movements; repositioned, heat applied to neck, massage and trigger point to neck, educated on AROM of neck "+" and "x" movements    ADL  Eating/Feeding: Simulated;Set up Where Assessed - Eating/Feeding: Chair Grooming: Simulated;Set up;Supervision/safety Where Assessed - Grooming: Unsupported sitting Upper Body Bathing: Simulated;Set up;Supervision/safety Where Assessed - Upper Body Bathing: Unsupported sitting Lower Body Bathing: Simulated;Maximal assistance Where Assessed - Lower Body Bathing: Supported sit to stand Upper Body Dressing: Simulated;Moderate assistance Where Assessed - Upper Body Dressing: Unsupported sitting Lower Body Dressing: Simulated;+1 Total assistance Where Assessed - Lower Body Dressing: Supported sit to stand Toilet Transfer: Simulated;Minimal assistance Toilet Transfer Method: Sit to stand;Stand pivot Acupuncturist:  (Bed>recliner next to bed) Toileting - Clothing Manipulation and Hygiene: Simulated;Moderate assistance Where Assessed - Toileting Clothing  Manipulation and Hygiene: Standing Equipment Used: Gait belt;Rolling walker Transfers/Ambulation Related to ADLs: Mod A sit to stand and stand to sit, Min A for ambulation (however pt states that when a spasm hits it throws him off balance)--no spasms in standing or turning to the recliner today, only with bed mobility    OT Diagnosis: Generalized weakness;Acute pain  OT Problem List: Decreased strength;Decreased activity tolerance;Impaired balance (sitting and/or standing);Pain;Decreased knowledge of use of DME or AE OT Treatment Interventions: Self-care/ADL training;Balance training;Patient/family education;DME and/or AE instruction;Therapeutic exercise;Therapeutic activities   OT Goals Acute Rehab OT Goals OT Goal Formulation: With patient Time For Goal Achievement: 04/21/13 Potential to Achieve Goals: Good ADL Goals Pt Will Perform Grooming: with set-up;with supervision;Unsupported;Standing at sink ADL Goal: Grooming - Progress: Goal set today Pt Will Transfer to Toilet: with min assist;Ambulation;with DME;Comfort height toilet;Grab bars;3-in-1;Regular height toilet ADL Goal: Toilet Transfer - Progress: Goal set today Miscellaneous OT Goals Miscellaneous OT Goal #1: Pt will be S to roll left and right for BADLs OT Goal: Miscellaneous Goal #1 - Progress: Goal set today Miscellaneous OT Goal #2: Pt will come up to sit EOB with Min A in prep for BADLs and/or transfers. OT Goal: Miscellaneous Goal #2 - Progress: Goal set today Miscellaneous OT Goal #3: Pt will be S for neck exercises to increase range to his right. OT Goal: Miscellaneous Goal #3 - Progress: Goal set today  Visit Information  Last OT Received On: 04/14/13 Assistance Needed: +1    Subjective Data  Subjective: I'm having spasms on my neck and my hip with certain movements   Prior Functioning     Home Living Lives With: Spouse Available Help at Discharge: Family;Available 24 hours/day Type of Home: Apartment Home  Access: Stairs to enter Entrance Stairs-Number of Steps: 17 Entrance Stairs-Rails: Right Home Layout: One  level Bathroom Shower/Tub: Walk-in shower;Door Home Adaptive Equipment: Straight cane Prior Function Level of Independence: Independent Able to Take Stairs?: Yes Driving: Yes Vocation: Retired Comments: Pt was very moblie pta without AD Communication Communication: No difficulties Dominant Hand: Right            Cognition  Cognition Arousal/Alertness: Awake/alert Behavior During Therapy: WFL for tasks assessed/performed Overall Cognitive Status: Within Functional Limits for tasks assessed    Extremity/Trunk Assessment Right Upper Extremity Assessment RUE ROM/Strength/Tone: [facility] for tasks assessed Left Upper Extremity Assessment LUE ROM/Strength/Tone: WFL for tasks assessed     Mobility Bed Mobility Bed Mobility: Rolling Right;Right Sidelying to Sit;Sitting - Scoot to Edge of Bed Rolling Right: 4: Min assist;With rail Right Sidelying to Sit: 3: Mod assist;HOB flat;With rails Sitting - Scoot to Edge of Bed: 4: Min guard Details for Bed Mobility Assistance: Cues for technique and pauses x3 due to spams on right hip Transfers Transfers: Sit to Stand;Stand to Sit Sit to Stand: 3: Mod assist;With upper extremity assist;From bed Stand to Sit: 3: Mod assist;With upper extremity assist;With armrests;To chair/3-in-1 Details for Transfer Assistance: VCs for hand placement        Balance Balance Balance Assessed: Yes Static Sitting Balance Static Sitting - Balance Support: No upper extremity supported;Feet supported Static Sitting - Level of Assistance:  (min guard A)   End of Session OT - End of Session Equipment Utilized During Treatment: Gait belt Activity Tolerance: Patient limited by pain Patient left: in chair;with call bell/phone within reach;with family/visitor present Nurse Communication:  (If he had a muscle relaxer--RN to let MD know)       Evette Georges [phone] 04/14/2013, 5:03 PM

## 2013-04-14 NOTE — Progress Notes (Signed)
04/14/13 1030 PT has recommended CIR.  Pt. may dc to CIR if accepted, otherwise pt. will dc home with home health.   NCM will continue to follow. Tera Mater, RN, BSN NCM [phone]

## 2013-04-14 NOTE — Progress Notes (Signed)
Received notice from CIR that is it doubtful that they will have a vacancy for patient and he is medically stable for d/c per MD.  CSW met with with patient and his wife; bed offers provided and they have chosen [facility].  CSW spoke with Wille Celeste at [facility]- she will review referral and call in the a.m with bed offer.  Other bed offers in place, if they are unable to offer in the a.m.  Plan d/c to SNF tomorrow.  Patient and wife are please with above.  CSW will follow up in the morning regarding d/c.  Fl2 on chart for MD's signature.   Lorri Frederick. [place]  [phone]

## 2013-04-14 NOTE — Progress Notes (Signed)
Rehab admissions - Evaluated for possible admission.  Patient appropriate for inpatient rehab admission.  However, limited beds open on inpatient rehab today and tomorrow.  Doubt that I can prioritize him into inpatient rehab today or tomorrow.  Recommend SNF pursuit as a back up in case no inpatient rehab bed is available.  Call me for questions.  #[phone]

## 2013-04-14 NOTE — Clinical Social Work Placement (Addendum)
     Clinical Social Work Department CLINICAL SOCIAL WORK PLACEMENT NOTE 04/15/2013  Patient:  LEHMAN, WHITELEY  Account Number:  [id number] Admit date:  04/07/2013  Clinical Social Worker:  Lupita Leash Pacen Watford, LCSWA  Date/time:  04/14/2013 10:00 AM  Clinical Social Work is seeking post-discharge placement for this patient at the following level of care:   SKILLED NURSING   (*CSW will update this form in Epic as items are completed)   04/14/2013  Patient/family provided with [facility] Department of Clinical Social Works list of facilities offering this level of care within the geographic area requested by the patient (or if unable, by the patients family).  04/14/2013  Patient/family informed of their freedom to choose among providers that offer the needed level of care, that participate in Medicare, Medicaid or managed care program needed by the patient, have an available bed and are willing to accept the patient.  04/14/2013  Patient/family informed of [facility] ownership interest in [facility], as well as of the fact that they are under no obligation to receive care at this facility.  PASARR submitted to EDS on 04/14/2013 PASARR number received from EDS on 04/14/2013  FL2 transmitted to all facilities in geographic area requested by pt/family on  04/14/2013 FL2 transmitted to all facilities within larger geographic area on   Patient informed that his/her managed care company has contracts with or will negotiate with  certain facilities, including the following:   NA     Patient/family informed of bed offers received:  04/14/2013 Patient [facility] bed [facility] Physician recommends and patient chooses bed at    Patient to be transferred to [facility] on  04/15/2013 Patient to be transferred to facility by Ambulance  The following physician request were entered in Epic:   Additional Comments: 04/14/13. Patient and  wife hoped for placement in CIR but they do not have any vacancies. Accepted bed at [facility].  04/15/13  Ok per MD for d/c today to SNF. OK per patient, wife and notified pt's nurse of d/c plan.  No further CSW needs identified.  CSW signing off.  Lorri Frederick.Sidra Oldfield, LCSWA [phone]

## 2013-04-14 NOTE — Progress Notes (Signed)
Rehab Admissions Coordinator Note:  Patient was screened by Trish Mage for appropriateness for an Inpatient Acute Rehab Consult.  At this time, a rehab consult has been ordered and is pending MD completion.  Will defer prescreen.  Lelon Frohlich M 04/14/2013, 8:29 AM  I can be reached at [phone].

## 2013-04-14 NOTE — Clinical Social Work Psychosocial (Signed)
     Clinical Social Work Department BRIEF PSYCHOSOCIAL ASSESSMENT 04/14/2013  Patient:  [name] [name], [name] [name]     Account Number:  [id number]     Admit date:  04/07/2013  Clinical Social Worker:  Tiburcio Pea  Date/Time:  04/14/2013 09:45 AM  Referred by:  Physician  Date Referred:  04/14/2013 Referred for  SNF Placement   Other Referral:   CIR is first choice   Interview type:  Other - See comment Other interview type:   Patient and wife    PSYCHOSOCIAL DATA Living Status:  WIFE Admitted from facility:   Level of care:   Primary support name:  [name] [name] Primary support relationship to patient:  SPOUSE Degree of support available:   Strong support    CURRENT CONCERNS Current Concerns  Post-Acute Placement   Other Concerns:    SOCIAL WORK ASSESSMENT / PLAN CSW met with patient's wife while he was receiving therapy this morning to discuss d/c disposition.  Patient is currently being considered for CIR and this is their first choice for disposition. Wife does not feel she can manage him at home at this time.  CSW discussed need for "back up" to CIR choice and benefits of SNF search which they agreed on. SNF list provided and SNF search will be initiated. Fl2 to be placed on chart for MD's signature.  Patient's wife states that she will discuss with the children this afternoon and CSW will follow up as soon as CIR assessment is completed.   Assessment/plan status:  Psychosocial Support/Ongoing Assessment of Needs Other assessment/ plan:   Information/referral to community resources:   SNF bed list provided  CIR discussed  Discussed home health and private duty care    PATIENTS/FAMILYS RESPONSE TO PLAN OF CARE: Patient and wife agree to SNF search but remain hopeful that CIR will be able to accept patient. They agree to SNF search for a back up.  Patient was up working with PT today; his wife was very responsive and appreciative of CSW's visit as was patient.   CSW will assist this sweet family with disposition.

## 2013-04-14 NOTE — Progress Notes (Signed)
[facility] PROGRESS NOTE  [name] [name] ZOX:[id number] DOB: 12/22/1925 DOA: 04/07/2013 PCP: Bufford Spikes, DO  Assessment/Plan: 1. Sepsis - Improving on Levaquin and Zosyn - Growing E coli in blood. - VSS - hypotension resolved  2. Bacteremia - Growing E coli pan sensitive - placed on cipro. Tolerating cipro well - Will treat for 10 days total and have patient follow up with his PCP or PCP at nursing home.  3. Chronic systolic heart failure -  On Imdur - B blocker and lasix on hold due to recent hypotension from sepsis.  Once blood pressures more steady will plan on continuing home regimen.  4. CAD - continue statin  5. Acute on chronic renal failure - creatinine improving.  Will continue to monitor S creatinine - most likely due to hypotension related to Sepsis  6. Hypokalemia - replace orally and reassess next am - bmp next am.  Code Status: DNR Family Communication: discussed with family at bedside Disposition Plan: was indicated that most likely patient will not meed CIR admission criteria.  Likely d/c to SNF once choice available and patient has made decision.   Consultants:  Onalee Hua simonds: critical care  Procedures:  Central line  Antibiotics:  Levaquin>>>5/4  Zosyn>>>5/4  HPI/Subjective: Has been complaining of neck stiffness.  But patient mentions that he feels better today.  Was requesting a muscle relaxer for his stiff neck.  Objective: Filed Vitals:   04/13/13 2020 04/14/13 0648 04/14/13 1134 04/14/13 1419  BP: 136/45 145/52 156/64 129/54  Pulse: 59 70 70 62  Temp: 97.7 F (36.5 C) 98.1 F (36.7 C)  97.7 F (36.5 C)  TempSrc: Oral Oral  Oral  Resp: 20 20  20   Height:      Weight:  63 kg (138 lb 14.2 oz)    SpO2: 100% 96% 97% 96%    Intake/Output Summary (Last 24 hours) at 04/14/13 1831 Last data filed at 04/14/13 1023  Gross per 24 hour  Intake      0 ml  Output    525 ml  Net   -525 ml   Filed Weights   04/12/13 0455  04/13/13 0502 04/14/13 0648  Weight: 64.456 kg (142 lb 1.6 oz) 63.8 kg (140 lb 10.5 oz) 63 kg (138 lb 14.2 oz)    Exam:   General:  Pt in nad, alert and awake  Cardiovascular: RRR, no MRG  Respiratory: no wheezes, mild rhales  Abdomen: soft, NT, ND  Musculoskeletal: no cyanosis or clubbing  Data Reviewed: Basic Metabolic Panel:  Recent Labs Lab 04/08/13 0655 04/09/13 0420 04/11/13 0432 04/12/13 0430 04/13/13 0555  NA 138 135 135 137 136  K 4.1 3.5 3.6 3.5 3.3*  CL 97 96 95* 98 100  CO2 31 28 27 25 26   GLUCOSE 185* 127* 152* 160* 160*  BUN 42* 55* 82* 90* 87*  CREATININE 2.18* 3.22* 3.62* 3.35* 3.31*  CALCIUM 9.4 8.9 9.0 9.1 9.0   Liver Function Tests:  Recent Labs Lab 04/11/13 0432 04/12/13 0430  AST 97* 39*  ALT 181* 128*  ALKPHOS 72 100  BILITOT 0.7 0.7  PROT 6.0 6.0  ALBUMIN 2.2* 2.1*   No results found for this basename: LIPASE, AMYLASE,  in the last 168 hours No results found for this basename: AMMONIA,  in the last 168 hours CBC:  Recent Labs Lab 04/08/13 0655 04/09/13 1422 04/10/13 0500 04/11/13 0432 04/13/13 0555  WBC 11.6* 15.0* 8.9 7.2 6.6  NEUTROABS  --  13.7* 8.1*  --   --  HGB 11.7* 10.2* 10.0* 10.0* 10.7*  HCT 35.4* 30.2* 29.3* 29.1* 31.0*  MCV 88.5 87.8 87.2 85.3 84.7  PLT 129* 81* 63* 62* 66*   Cardiac Enzymes:  Recent Labs Lab 04/08/13 0114 04/08/13 0655 04/08/13 1548  TROPONINI <0.30 <0.30 <0.30   BNP (last 3 results)  Recent Labs  07/23/12 0433 07/31/12 0842 04/07/13 1706  PROBNP 30452.0* 1518.0* 16363.0*   CBG:  Recent Labs Lab 04/13/13 1641 04/13/13 2135 04/14/13 0614 04/14/13 1119 04/14/13 1622  GLUCAP 133* 153* 157* 213* 168*    Recent Results (from the past 240 hour(s))  MRSA PCR SCREENING     Status: None   Collection Time    04/08/13  1:49 AM      Result Value Range Status   MRSA by PCR NEGATIVE  NEGATIVE Final   Comment:            The GeneXpert MRSA Assay (FDA     approved for NASAL  specimens     only), is one component of a     comprehensive MRSA colonization     surveillance program. It is not     intended to diagnose MRSA     infection nor to guide or     monitor treatment for     MRSA infections.  CULTURE, BLOOD (ROUTINE X 2)     Status: None   Collection Time    04/09/13  2:00 PM      Result Value Range Status   Specimen Description BLOOD RIGHT HAND   Final   Special Requests BOTTLES DRAWN AEROBIC AND ANAEROBIC 10CC   Final   Culture  Setup Time 04/09/2013 18:46   Final   Culture     Final   Value: ESCHERICHIA COLI     Note: Gram Stain Report Called to,Read Back By and Verified With: ZYRIL AT 0445 04/10/13 SNOLO   Report Status 04/12/2013 FINAL   Final   Organism ID, Bacteria ESCHERICHIA COLI   Final  CULTURE, BLOOD (ROUTINE X 2)     Status: None   Collection Time    04/09/13  2:08 PM      Result Value Range Status   Specimen Description BLOOD RIGHT FOREARM   Final   Special Requests BOTTLES DRAWN AEROBIC ONLY 10CC   Final   Culture  Setup Time 04/09/2013 18:46   Final   Culture     Final   Value: ESCHERICHIA COLI     Note: SUSCEPTIBILITIES PERFORMED ON PREVIOUS CULTURE WITHIN THE LAST 5 DAYS.     Note: Gram Stain Report Called to,Read Back By and Verified With: ZYRIL AT 0446 04/10/13 BY SNOLO   Report Status 04/12/2013 FINAL   Final     Studies: No results found.  Scheduled Meds: . aspirin EC  325 mg Oral q morning - 10a  . cholecalciferol  1,000 Units Oral q morning - 10a  . ciprofloxacin  500 mg Oral Q24H  . cyclobenzaprine  5 mg Oral QHS  . insulin aspart  0-15 Units Subcutaneous TID WC  . insulin aspart  0-5 Units Subcutaneous QHS  . isosorbide mononitrate  60 mg Oral Daily  . pantoprazole  40 mg Oral Daily  . tamsulosin  0.4 mg Oral QPC supper  . Travoprost (BAK Free)  1 drop Both Eyes BID   Continuous Infusions:   Principal Problem:   Sepsis Active Problems:   Acute on chronic renal failure   Hypokalemia   CAD   Chronic systolic  heart failure   Sepsis associated hypotension   Hypotension, unspecified   Bacteremia due to Gram-negative bacteria    Time spent: > 35 minutes    Penny Pia  [facility] Pager [phone]. If 7PM-7AM, please contact night-coverage at [email or web address], password [facility] 04/14/2013, 6:31 PM  LOS: 7 days

## 2013-04-14 NOTE — Consult Note (Signed)
Physical Medicine and Rehabilitation Consult Reason for Consult: Deconditioning/sepsis Referring Physician: [facility]   HPI: [name] [name] is a 79 y.o. right-handed male with history of coronary artery disease, CABG, AVR, chronic diastolic congestive heart failure and chronic renal insufficiency with baseline creatinine 2.27 at admission. Patient independent in active prior to admission. Admitted 04/09/2013 with abdominal and chest pain as well as low-grade fever 101. Workup for myocardial infarction negative. Chest x-ray with mild congestive heart failure and interstitial pulmonary edema. Placed on broad-spectrum antibiotics. Blood cultures positive for gram-negative rods/Escherichia coli 04/10/2013. Bouts of hypotension felt to be related to sepsis and monitored. Presently maintained on by mouth Cipro. Physical therapy evaluated 04/11/2013 for deconditioning related to sepsis with recommendations for physical medicine rehabilitation consult to consider inpatient rehabilitation services   Review of Systems  Respiratory: Positive for shortness of breath.   Cardiovascular: Positive for leg swelling.  Gastrointestinal:       Reflux  Genitourinary: Positive for urgency.  Musculoskeletal: Positive for back pain.  Neurological: Positive for weakness.  All other systems reviewed and are negative.   Past Medical History  Diagnosis Date  . History of colon cancer     s/p colon resection  . Aortic stenosis     a. s/p AVR with 23mm Edwards pericardial valve 02/07/12 - post-op course complicated by pleural effusion requring thoracentesis/leg cellulitis 03/2012.  Marland Kitchen CAD (coronary artery disease)     a. s/p NSTEMI 12/2011:  LHC - Ostial left main 20%, ostial LAD 50%, mid 60-70%, ostial D1 40% and mid 40%, D2 70%, ostial circumflex occluded, ostial RCA 80-90%, LVEDP was 42. b.  s/p CABG x 3 at time of AVR (LiMA-LAD, SVG-2nd daigonal, SVG-PDA) 02/07/12 (post-op course noted above).  . Ischemic cardiomyopathy    . Chronic systolic heart failure     a. TEE 01/2012: EF 25-30%, diffuse hypokinesis. b. Not on ACEI due to renal insufficiency.;  c. follow up  echo 08/06/12: EF 25%, mod diast dysfxn, AVR ok, mild MR, mod LAE, mild RAE, mild to mod RV systolic dysfunction  . HLD (hyperlipidemia)   . GERD (gastroesophageal reflux disease)   . Chronic kidney disease   . Pleural effusion     a. post-operatively after AVR/CABG s/p thoracentesis 03/2012 yielding 1L serosanguinous fluid.  . Diabetes mellitus     borderline  . Blood transfusion     NO REACTION TO TRANSFUSION  . Cellulitis     a. RLE cellulitis 2 months post-operatively after AVR/CABG - serratia marcessans, tx with I&D/antibiotics  . Mitral regurgitation     Moderate by TEE 01/2012  . Flash pulmonary edema     Post-cath 12/2011, went into acute pulm edema requiring IV lasix and intubation  . Baker's cyst 07/23/12    Incidental finding of LE venous dopplers  . Cataract     right eye, hx of  . HTN (hypertension)     primary, Dr. Dorris Fetch  . Myocardial infarction 12/2011  . CHF (congestive heart failure)   . Stroke 10/01    RIght Leg weakness  . Cancer '90's    Colon  . Anemia    Past Surgical History  Procedure Laterality Date  . Colon resection  1996  . Esophageal dilation    . Colonoscopy    . Cataract extraction      rt  . Cardiac catheterization      1.3.13  stopped breathing, put on ventilator for 5-6 days  . Tonsillectomy    . Aortic valve replacement  02/07/2012    Procedure: AORTIC VALVE REPLACEMENT (AVR);  Surgeon: [name], MD;  Location: [facility] OR;  Service: Open Heart Surgery;  Laterality: N/A;  . Coronary artery bypass graft  02/07/2012    Procedure: CORONARY ARTERY BYPASS GRAFTING (CABG);  Surgeon: [name], MD;  Location: [facility] OR;  Service: Open Heart Surgery;  Laterality: N/A;  CABG x three; using right leg greater saphenous vein harvested endoscopically  . Chest tube insertion  07/24/2012    Procedure:  INSERTION PLEURAL DRAINAGE CATHETER;  Surgeon: [name], MD;  Location: [facility] OR;  Service: Thoracic;  Laterality: Left;  . Talc pleurodesis  08/30/2012    Procedure: TALC PLEURADESIS;  Surgeon: [name], MD;  Location: [facility] OR;  Service: Thoracic;  Laterality: Left;  INSERTION OF TALC VIA LEFT PLEURX  . Talc pleurodesis  09/12/2012    Procedure: TALC PLEURADESIS;  Surgeon: [name], MD;  Location: [facility] OR;  Service: Thoracic;  Laterality: Left;  . Removal of pleural drainage catheter  09/27/2012    Procedure: REMOVAL OF PLEURAL DRAINAGE CATHETER;  Surgeon: [name], MD;  Location: [facility] OR;  Service: Thoracic;  Laterality: Left;  Marland Kitchen Eye surgery  2009    cataract removed from right eye   Family History  Problem Relation Age of Onset  . Cancer Mother   . Heart attack Father   . Mental illness Brother   . Kidney failure Brother    Social History:  reports that he has never smoked. He has never used smokeless tobacco. He reports that he does not drink alcohol or use illicit drugs. Allergies:  Allergies  Allergen Reactions  . Statins     Pain/weakness in legs   Medications Prior to Admission  Medication Sig Dispense Refill  . aspirin EC 325 MG tablet Take 325 mg by mouth every morning.       . carvedilol (COREG) 25 MG tablet Take 1 tablet (25 mg total) by mouth 2 (two) times daily with a meal.  60 tablet  5  . cholecalciferol (VITAMIN D) 1000 UNITS tablet Take 1,000 Units by mouth every morning.       Marland Kitchen epoetin alfa (EPOGEN,PROCRIT) 2000 UNIT/ML injection Inject 2,000 Units into the skin every 21 ( twenty-one) days. If hemoglobin <10      . furosemide (LASIX) 40 MG tablet Take 40 mg by mouth 2 (two) times daily.       . hydrALAZINE (APRESOLINE) 25 MG tablet Take 25 mg by mouth 3 (three) times daily.      . isosorbide mononitrate (IMDUR) 30 MG 24 hr tablet Take 30 mg by mouth every morning.      . Multiple Vitamin (MULTIVITAMIN) capsule Take 1 capsule by mouth every morning.        . nitroGLYCERIN (NITROSTAT) 0.4 MG SL tablet Place 1 tablet (0.4 mg total) under the tongue every 5 (five) minutes x 3 doses as needed. For chest pain  25 tablet  5  . omeprazole (PRILOSEC) 20 MG capsule Take 20 mg by mouth every morning.       . Prenatal Vit-Fe Fumarate-FA (MULTIVITAMIN-PRENATAL) 27-0.8 MG TABS Take 1 tablet by mouth every evening.      . Tamsulosin HCl (FLOMAX) 0.4 MG CAPS Take 1 capsule (0.4 mg total) by mouth daily after supper.  30 capsule  3  . Travoprost, BAK Free, (TRAVATAN) 0.004 % SOLN ophthalmic solution Place 1 drop into both eyes 2 (two) times daily.  Home: Home Living Lives With: Spouse Available Help at Discharge: Family;Available 24 hours/day Type of Home: Apartment Home Access: Stairs to enter Entrance Stairs-Number of Steps: 17 Entrance Stairs-Rails: Right Home Layout: One level Home Adaptive Equipment: Straight cane  Functional History: Prior Function Able to Take Stairs?: Yes Driving: Yes Vocation: Retired Comments: Very mobile pta Functional Status:  Mobility: Bed Mobility Bed Mobility: Rolling Right;Right Sidelying to Sit;Sitting - Scoot to [place] of Bed;Sit to Supine Rolling Right: 3: Mod assist;With rail Right Sidelying to Sit: 3: Mod assist;With rails Sitting - Scoot to Edge of Bed: 4: Min guard Sit to Supine: 3: Mod assist Transfers Transfers: Sit to Stand;Stand to Sit Sit to Stand: 3: Mod assist;With upper extremity assist;From bed Stand to Sit: 3: Mod assist;With upper extremity assist;To bed Ambulation/Gait Ambulation/Gait Assistance: 3: Mod assist Ambulation Distance (Feet): 3 Feet Assistive device: 1 person hand held assist Ambulation/Gait Assistance Details: Verbal cues to stand upright - patient unable.  Declined use of RW.  Attempted gait with hand-hold assist.  Patient only able to step 3', and the backward to bed.  Balance decreased in standing. Gait Pattern: Step-to pattern;Decreased step length - right;Decreased  step length - left;Decreased stride length;Right flexed knee in stance;Left flexed knee in stance;Shuffle;Trunk flexed Gait velocity: Slow    ADL:    Cognition: Cognition Overall Cognitive Status: Within Functional Limits for tasks assessed Arousal/Alertness: Awake/alert Orientation Level: Oriented X4 Cognition Arousal/Alertness: Awake/alert Behavior During Therapy: WFL for tasks assessed/performed Overall Cognitive Status: Within Functional Limits for tasks assessed  Blood pressure 136/45, pulse 59, temperature 97.7 F (36.5 C), temperature source Oral, resp. rate 20, height 5\' 8"  (1.727 m), weight 63.8 kg (140 lb 10.5 oz), SpO2 100.00%. Physical Exam  Vitals reviewed. Constitutional: He is oriented to person, place, and time.  HENT:  Head: Normocephalic.  Eyes: EOM are normal.  Neck: Normal range of motion. Neck supple. No thyromegaly present.  Cardiovascular: Normal rate and regular rhythm.   Pulmonary/Chest:  Decreased breath sounds but clear to auscultation  Abdominal: Soft. Bowel sounds are normal. He exhibits no distension.  Musculoskeletal:  Left torticollis, tight left SCM  Neurological: He is alert and oriented to person, place, and time.  Follows full commands. Girdle weakness. 3 to 3+ in UE prox to 4/5 distally. LE is 2-/5 prox to 3+ distally. No gross sensory deficits.   Skin: Skin is warm and dry.  Psychiatric: He has a normal mood and affect. His behavior is normal. Judgment and thought content normal.    Results for orders placed during the hospital encounter of 04/07/13 (from the past 24 hour(s))  GLUCOSE, CAPILLARY     Status: Abnormal   Collection Time    04/13/13 11:55 AM      Result Value Range   Glucose-Capillary 241 (*) 70 - 99 mg/dL   Comment 1 Documented in Chart     Comment 2 Notify RN    GLUCOSE, CAPILLARY     Status: Abnormal   Collection Time    04/13/13  4:41 PM      Result Value Range   Glucose-Capillary 133 (*) 70 - 99 mg/dL   Comment  1 Documented in Chart     Comment 2 Notify RN    GLUCOSE, CAPILLARY     Status: Abnormal   Collection Time    04/13/13  9:35 PM      Result Value Range   Glucose-Capillary 153 (*) 70 - 99 mg/dL   No results found.  Assessment/Plan: Diagnosis:  severe deconditioning after CHF/sepsis 1. Does the need for close, 24 hr/day medical supervision in concert with the patient's rehab needs make it unreasonable for this patient to be served in a less intensive setting? Yes 2. Co-Morbidities requiring supervision/potential complications: cad, chronic systolic HF, anemia, ckd 3. Due to bladder management, bowel management, safety, skin/wound care, disease management, medication administration, pain management and patient education, does the patient require 24 hr/day rehab nursing? Yes 4. Does the patient require coordinated care of a physician, rehab nurse, PT (1-2 hrs/day, 5 days/week) and OT (1-2 hrs/day, 5 days/week) to address physical and functional deficits in the context of the above medical diagnosis(es)? Yes Addressing deficits in the following areas: balance, endurance, locomotion, strength, transferring, bowel/bladder control, bathing, dressing, feeding, grooming, toileting and psychosocial support 5. Can the patient actively participate in an intensive therapy program of at least 3 hrs of therapy per day at least 5 days per week? Yes 6. The potential for patient to make measurable gains while on inpatient rehab is excellent 7. Anticipated functional outcomes upon discharge from inpatient rehab are mod I to supervision with PT, mod I to min assist with OT, n/a with SLP. 8. Estimated rehab length of stay to reach the above functional goals is: 2 weeks 9. Does the patient have adequate social supports to accommodate these discharge functional goals? Yes 10. Anticipated D/C setting: Home 11. Anticipated post D/C treatments: HH therapy 12. Overall Rehab/Functional Prognosis:  excellent  RECOMMENDATIONS: This patient's condition is appropriate for continued rehabilitative care in the following setting: CIR Patient has agreed to participate in recommended program. Yes Note that insurance prior authorization may be required for reimbursement for recommended care.  Comment: Rehab RN to follow up.   Ranelle Oyster, MD, [place] [facility]     04/14/2013

## 2013-04-14 NOTE — Progress Notes (Signed)
Physical Therapy Treatment Patient Details Name: [name] [name] MRN: [id number] DOB: October 18, 1926 Today's Date: 04/14/2013 Time: 1031-1100 PT Time Calculation (min): 29 min  PT Assessment / Plan / Recommendation Comments on Treatment Session  Pt able to ambulate today but cont's to be very deconditioned/weak.  Cont to recommend CIR to maximize functional recovery & independence with mobility (if not accepted to CIR, he will need SNF)    Follow Up Recommendations  CIR     Does the patient have the potential to tolerate intense rehabilitation     Barriers to Discharge        Equipment Recommendations  Rolling walker with 5" wheels    Recommendations for Other Services    Frequency Min 3X/week   Plan Discharge plan remains appropriate    Precautions / Restrictions Precautions Precautions: Fall Restrictions Weight Bearing Restrictions: No       Mobility  Bed Mobility Bed Mobility: Rolling Right;Right Sidelying to Sit;Sitting - Scoot to Edge of Bed Rolling Right: 3: Mod assist;With rail Right Sidelying to Sit: 3: Mod assist;HOB flat;With rails Sitting - Scoot to Edge of Bed: 4: Min guard Details for Bed Mobility Assistance: Cues for rolling due to pt c/o back pain.  Pt also reports pain in Rt hip therefore may want to trial rolling to Lt.  Cues for sequencing & technique.  (A) for LE's & to lift shoulders/trunk to sitting upright.   Transfers Transfers: Sit to Stand;Stand to Sit Sit to Stand: 3: Mod assist;With upper extremity assist;From bed Stand to Sit: 3: Mod assist;With upper extremity assist;With armrests;To chair/3-in-1 Details for Transfer Assistance: Cues for hand placement & technique.   (A) to achieve standing, balance, facilitation for increased trunk/hip extension, & (A) for controlled descent to recliner.   Ambulation/Gait Ambulation/Gait Assistance: 4: Min assist Ambulation Distance (Feet): 60 Feet Assistive device: Rolling walker Ambulation/Gait Assistance  Details: Pt with flexed posture that worsens as distance increases as well as trunk leaning laterally towards Lt.  Pt with small, shuffling steps.   Gait Pattern: Step-through pattern;Decreased stride length;Trunk flexed;Shuffle;Lateral trunk lean to left Gait velocity: Slow     PT Goals Acute Rehab PT Goals Time For Goal Achievement: 04/25/13 Potential to Achieve Goals: Good Pt will Roll Supine to Right Side: with modified independence;with rail PT Goal: Rolling Supine to Right Side - Progress: Not met Pt will go Supine/Side to Sit: with modified independence;with HOB 0 degrees;with rail PT Goal: Supine/Side to Sit - Progress: Progressing toward goal Pt will go Sit to Supine/Side: with modified independence;with HOB 0 degrees;with rail Pt will go Sit to Stand: with supervision;with upper extremity assist PT Goal: Sit to Stand - Progress: Progressing toward goal Pt will Ambulate: 51 - 150 feet;with supervision;with rolling walker PT Goal: Ambulate - Progress: Progressing toward goal  Visit Information  Last PT Received On: 04/14/13 Assistance Needed: +1    Subjective Data  Patient Stated Goal: To go home   Cognition  Cognition Arousal/Alertness: Awake/alert Behavior During Therapy: WFL for tasks assessed/performed Overall Cognitive Status: Within Functional Limits for tasks assessed    Balance     End of Session PT - End of Session Equipment Utilized During Treatment: Gait belt Activity Tolerance: Patient tolerated treatment well Patient left: in chair;with call bell/phone within reach;with family/visitor present Nurse Communication: Mobility status     Verdell Face, [place] [phone] 04/14/2013

## 2013-04-15 DIAGNOSIS — I5022 Chronic systolic (congestive) heart failure: Secondary | ICD-10-CM | POA: Diagnosis not present

## 2013-04-15 DIAGNOSIS — A4159 Other Gram-negative sepsis: Secondary | ICD-10-CM | POA: Diagnosis not present

## 2013-04-15 DIAGNOSIS — I10 Essential (primary) hypertension: Secondary | ICD-10-CM | POA: Diagnosis not present

## 2013-04-15 DIAGNOSIS — A419 Sepsis, unspecified organism: Secondary | ICD-10-CM | POA: Diagnosis not present

## 2013-04-15 DIAGNOSIS — I2510 Atherosclerotic heart disease of native coronary artery without angina pectoris: Secondary | ICD-10-CM | POA: Diagnosis not present

## 2013-04-15 DIAGNOSIS — N390 Urinary tract infection, site not specified: Secondary | ICD-10-CM | POA: Diagnosis not present

## 2013-04-15 DIAGNOSIS — D72829 Elevated white blood cell count, unspecified: Secondary | ICD-10-CM | POA: Diagnosis not present

## 2013-04-15 DIAGNOSIS — R627 Adult failure to thrive: Secondary | ICD-10-CM | POA: Diagnosis not present

## 2013-04-15 DIAGNOSIS — M6281 Muscle weakness (generalized): Secondary | ICD-10-CM | POA: Diagnosis not present

## 2013-04-15 DIAGNOSIS — R7881 Bacteremia: Secondary | ICD-10-CM | POA: Diagnosis not present

## 2013-04-15 DIAGNOSIS — R279 Unspecified lack of coordination: Secondary | ICD-10-CM | POA: Diagnosis not present

## 2013-04-15 DIAGNOSIS — G40909 Epilepsy, unspecified, not intractable, without status epilepticus: Secondary | ICD-10-CM | POA: Diagnosis not present

## 2013-04-15 DIAGNOSIS — R269 Unspecified abnormalities of gait and mobility: Secondary | ICD-10-CM | POA: Diagnosis not present

## 2013-04-15 DIAGNOSIS — R609 Edema, unspecified: Secondary | ICD-10-CM | POA: Diagnosis not present

## 2013-04-15 DIAGNOSIS — N179 Acute kidney failure, unspecified: Secondary | ICD-10-CM | POA: Diagnosis not present

## 2013-04-15 DIAGNOSIS — D649 Anemia, unspecified: Secondary | ICD-10-CM | POA: Diagnosis not present

## 2013-04-15 DIAGNOSIS — E559 Vitamin D deficiency, unspecified: Secondary | ICD-10-CM | POA: Diagnosis not present

## 2013-04-15 DIAGNOSIS — E876 Hypokalemia: Secondary | ICD-10-CM | POA: Diagnosis not present

## 2013-04-15 DIAGNOSIS — I509 Heart failure, unspecified: Secondary | ICD-10-CM | POA: Diagnosis not present

## 2013-04-15 DIAGNOSIS — N400 Enlarged prostate without lower urinary tract symptoms: Secondary | ICD-10-CM | POA: Diagnosis not present

## 2013-04-15 DIAGNOSIS — N189 Chronic kidney disease, unspecified: Secondary | ICD-10-CM | POA: Diagnosis not present

## 2013-04-15 LAB — GLUCOSE, CAPILLARY: Glucose-Capillary: 184 mg/dL — ABNORMAL HIGH (ref 70–99)

## 2013-04-15 LAB — BASIC METABOLIC PANEL
CO2: 26 mEq/L (ref 19–32)
Calcium: 8.7 mg/dL (ref 8.4–10.5)
Chloride: 102 mEq/L (ref 96–112)
GFR calc Af Amer: 21 mL/min — ABNORMAL LOW (ref >90)
Sodium: 139 mEq/L (ref 135–145)

## 2013-04-15 MED ORDER — CIPROFLOXACIN HCL 500 MG PO TABS: 500.0000 mg | ORAL_TABLET | ORAL | Status: DC

## 2013-04-15 MED ORDER — CYCLOBENZAPRINE HCL 5 MG PO TABS: 5.0000 mg | ORAL_TABLET | Freq: Every day | ORAL | Status: DC

## 2013-04-15 MED ORDER — TRAMADOL HCL 50 MG PO TABS: 25.0000 mg | ORAL_TABLET | Freq: Four times a day (QID) | ORAL | Status: DC | PRN

## 2013-04-15 MED ORDER — ISOSORBIDE MONONITRATE ER 60 MG PO TB24: 60.0000 mg | ORAL_TABLET | Freq: Every day | ORAL | Status: DC

## 2013-04-15 NOTE — Progress Notes (Signed)
PT Cancellation Note  Patient Details Name: [name] [name] MRN: [id number] DOB: [birth year]   Cancelled Treatment:     Pt politely deferring therapy at this time.  Awaiting d/c to SNF.   Verdell Face, [place] [phone] 04/15/2013

## 2013-04-15 NOTE — Progress Notes (Signed)
Nutrition Brief Note  Patient identified on the Malnutrition Screening Tool (MST) Report for recent weight loss without trying.  Per records below, patient's weight fluctuates likely due to fluid & medical hx.  Wt Readings from Last 10 Encounters:  04/15/13 139 lb 5.3 oz (63.2 kg)  04/02/13 145 lb (65.772 kg)  03/06/13 145 lb (65.772 kg)  01/30/13 152 lb (68.947 kg)  01/08/13 154 lb 6.4 oz (70.035 kg)  01/08/13 159 lb (72.122 kg)  10/22/12 154 lb (69.854 kg)  10/16/12 154 lb 1.6 oz (69.899 kg)  09/24/12 138 lb 3.2 oz (62.687 kg)  09/10/12 138 lb (62.596 kg)    Body mass index is 21.19 kg/(m^2). Patient meets criteria for Normal based on current BMI.   Current diet order is Heart Healthy, patient's average consumption is 75% of meals at this time. Labs and medications reviewed.   No nutrition interventions warranted at this time. If nutrition issues arise, please consult RD.   Maureen Chatters, RD, LDN Pager #: [phone] After-Hours Pager #: [phone]

## 2013-04-15 NOTE — Progress Notes (Signed)
Discharged for [facility] via ambulance transport.  Wife left prior to meet with staff for 1:30pm appt.  Pt stated pain level in neck at baseline (chronic @ level # 4).  Skin warm and dry.  No voiced complaints.  Report called to nurse @ [facility].  Next dose for medications written on discharge medication form and signed.

## 2013-04-15 NOTE — Discharge Summary (Signed)
Physician Discharge Summary  [name] [name] XBJ:[id number] DOB: Nov 01, 1926 DOA: 04/07/2013  PCP: Bufford Spikes, DO  Admit date: 04/07/2013 Discharge date: 04/15/2013  Time spent: > 35 minutes  Recommendations for Outpatient Follow-up:  1. Please be sure to follow up with patient's blood pressures and if able continue some of his medication for heart failure which were held initially due to lower blood pressures related to sepsis. 2. Monitor K levels. 3. Will need continue physical therapy while at SNF 4. Also continue to monitor creatinine levels while at home.  Discharge Diagnoses:  Principal Problem:   Sepsis Active Problems:   Acute on chronic renal failure   Hypokalemia   CAD   Chronic systolic heart failure   Sepsis associated hypotension   Hypotension, unspecified   Bacteremia due to Gram-negative bacteria   Discharge Condition: stable  Diet recommendation:Heart healthy with low sodium  Filed Weights   04/13/13 0502 04/14/13 0648 04/15/13 0441  Weight: 63.8 kg (140 lb 10.5 oz) 63 kg (138 lb 14.2 oz) 63.2 kg (139 lb 5.3 oz)    History of present illness:  79 y/o with h/o CAD, CABG, AVR, Anemia, CKD with prior admission for CP and dyspnea on 04/07/13 which he ruled out for cardiac origin. Presented this admission with R chest discomfort as well as SOB and generalized weakness  Hospital Course:  1. Sepsis - Improved initially on Levaquin and Zosyn. Sensitivities reviewed and blood cultures grew out E coli which was pan sensitive. Patient changed over to Cipro orally and has continued to improve clinically. - VSS  - hypotension resolved   2. Bacteremia  - Growing E coli pan sensitive  - placed on cipro. Tolerating cipro well  - Will treat for 14 days total and have patient follow up with his PCP or PCP at nursing home. He has received 6 days of antibiotics while in house and a script will be provided for cipro for 8 more days.  3. Chronic systolic heart failure  - On  Imdur  - B blocker and lasix on hold due to recent hypotension from sepsis. Once blood pressures more steady will plan on continuing home regimen. This can be done by PCP at SNF  4. CAD  - continue statin   5. Acute on chronic renal failure  - creatinine improving.  - will recommend pcp continue to monitor this as outpatient.  Suspect that it will continue to improve with improved oral intake and general condition.  6. Hypokalemia  - replace orally and is back to normal limits on day of d/c   Procedures:  Echocardiogram  Central line placement  Consultations:  PCCM  Physical medicine and rehabilitation  Discharge Exam: Filed Vitals:   04/14/13 1134 04/14/13 1419 04/14/13 2148 04/15/13 0441  BP: 156/64 129/54 135/48 148/59  Pulse: 70 62 69 75  Temp:  97.7 F (36.5 C) 98.3 F (36.8 C) 98.6 F (37 C)  TempSrc:  Oral Oral Oral  Resp:  20 20 20   Height:      Weight:    63.2 kg (139 lb 5.3 oz)  SpO2: 97% 96% 96% 94%    General: Pt in NAD, Alert and Awake Cardiovascular: RRR, No MRG Respiratory: CTA BL, no wheezes  Discharge Instructions  Discharge Orders   Future Appointments Provider Department Dept Phone   04/23/2013 1:15 PM Delcie Roch [facility] [phone]   04/23/2013 1:45 PM [facility] In[facility] Nurse [place] [facility] [phone]  05/14/2013 1:15 PM Beverely Pace [facility] [place] [facility] [phone]   05/14/2013 1:45 PM [facility] Inj Nurse [place] [facility] [phone]   05/29/2013 8:15 AM [facility] [phone]   06/02/2013 1:45 PM [name] Alroy Dust, DO [facility] [phone]   06/04/2013 1:15 PM Beverely Pace [facility] [place] [facility] [phone]   06/04/2013 1:45 PM [facility] Inj Nurse [place] [facility] [phone]   06/25/2013 1:15 PM Beverely Pace [facility] [place] [facility]  MEDICAL ONCOLOGY [phone]   06/25/2013 1:45 PM [facility] Inj Nurse [place] [facility] [phone]   06/25/2013 2:45 PM Myrtis Ser, NP [place] [facility] [phone]   Future Orders Complete By Expires     Call MD for:  severe uncontrolled pain  As directed     Call MD for:  temperature >100.4  As directed     Diet - low sodium heart healthy  As directed     Discharge instructions  As directed     Comments:      Will need to be further evaluated in ~ 1 week post discharge by primary care physician at [facility]. Patient is to complete full recommended dose of antibiotic regimen prescribed.    Increase activity slowly  As directed         Medication List    STOP taking these medications       carvedilol 25 MG tablet  Commonly known as:  COREG     epoetin alfa 2000 UNIT/ML injection  Commonly known as:  EPOGEN,PROCRIT     furosemide 40 MG tablet  Commonly known as:  LASIX     hydrALAZINE 25 MG tablet  Commonly known as:  APRESOLINE      TAKE these medications       aspirin EC 325 MG tablet  Take 325 mg by mouth every morning.     cholecalciferol 1000 UNITS tablet  Commonly known as:  VITAMIN D  Take 1,000 Units by mouth every morning.     ciprofloxacin 500 MG tablet  Commonly known as:  CIPRO  Take 1 tablet (500 mg total) by mouth daily.     cyclobenzaprine 5 MG tablet  Commonly known as:  FLEXERIL  Take 1 tablet (5 mg total) by mouth at bedtime.     isosorbide mononitrate 60 MG 24 hr tablet  Commonly known as:  IMDUR  Take 1 tablet (60 mg total) by mouth daily.     multivitamin capsule  Take 1 capsule by mouth every morning.     multivitamin-prenatal 27-0.8 MG Tabs  Take 1 tablet by mouth every evening.     nitroGLYCERIN 0.4 MG SL tablet  Commonly known as:  NITROSTAT  Place 1 tablet (0.4 mg total) under the tongue every 5 (five) minutes x 3 doses as needed. For chest pain     omeprazole 20 MG capsule   Commonly known as:  PRILOSEC  Take 20 mg by mouth every morning.     tamsulosin 0.4 MG Caps  Commonly known as:  FLOMAX  Take 1 capsule (0.4 mg total) by mouth daily after supper.     traMADol 50 MG tablet  Commonly known as:  ULTRAM  Take 0.5 tablets (25 mg total) by mouth every 6 (six) hours as needed for pain.     Travoprost (BAK Free) 0.004 % Soln ophthalmic solution  Commonly known as:  TRAVATAN  Place 1 drop into both eyes 2 (two) times daily.       Allergies  Allergen Reactions  . Statins     Pain/weakness in legs      The results of significant diagnostics from this hospitalization (including imaging, microbiology, ancillary and laboratory) are listed below for reference.    Significant Diagnostic Studies: Dg Chest Port 1 View  04/11/2013  *RADIOLOGY REPORT*  Clinical Data: Evaluate respiratory failure  PORTABLE CHEST - 1 VIEW  Comparison: 04/09/2013; 04/07/2013  Findings:  Grossly unchanged cardiac silhouette and mediastinal contours post median sternotomy, CABG and valve replacement.  Stable positioning of support apparatus.  Pulmonary vasculature remains indistinct with cephalization of flow.  Grossly unchanged bibasilar heterogeneous opacities, left greater than right.  Small left-sided effusion is grossly unchanged given slightly reduced lung volumes. No definite pneumothorax.  Unchanged bones.  IMPRESSION: 1.  Grossly unchanged findings of mild pulmonary edema and bibasilar opacities, left greater than right, atelectasis versus infiltrate.  2.  Likely unchanged chronic left-sided pleural effusion/pleural parenchymal thickening.   Original Report Authenticated By: Tacey Ruiz, MD    Dg Chest Port 1 View  04/09/2013  *RADIOLOGY REPORT*  Clinical Data: Central line placement  PORTABLE CHEST - 1 VIEW  Comparison: 04/09/2013  Findings: 1630 hours.  Right IJ central venous catheter is new in the interval.  Tip overlies the distal SVC.  No evidence for right- sided  pneumothorax or substantial right pleural effusion.  The cardiopericardial silhouette is enlarged.  Retrocardiac collapse / consolidation persist.  Vascular congestion noted without overt airspace pulmonary edema. Telemetry leads overlie the chest.  IMPRESSION: New right IJ central line tip projects over the distal SVC.  Cardiomegaly with vascular congestion.   Original Report Authenticated By: Kennith Center, M.D.    Dg Chest Port 1 View  04/09/2013  *RADIOLOGY REPORT*  Clinical Data: Right chest pain.  PORTABLE CHEST - 1 VIEW  Comparison: 04/07/2013.  Findings: Post CABG.  Cardiomegaly.  Pulmonary vascular congestion. Left base subsegmental atelectasis suspected.  Evaluation limited.  Calcified aorta.  No gross pneumothorax.  IMPRESSION: No significant change.   Original Report Authenticated By: Lacy Duverney, M.D.    Dg Chest Port 1 View  04/07/2013  *RADIOLOGY REPORT*  Clinical Data: Abdominal pain.  Chest pain.  PORTABLE CHEST - 1 VIEW  Comparison: 01/30/2013.  Findings: Cardiomegaly.  Median sternotomy / CABG.  Pulmonary vascular congestion is present.  Interstitial pulmonary edema present with Charyl Dancer B lines at the periphery, most notable at the right base.  No pleural effusion is identified.  Mild prostatic aortic valve.  IMPRESSION: Mild CHF with cardiomegaly and interstitial pulmonary edema.   Original Report Authenticated By: Andreas Newport, M.D.     Microbiology: Recent Results (from the past 240 hour(s))  MRSA PCR SCREENING     Status: None   Collection Time    04/08/13  1:49 AM      Result Value Range Status   MRSA by PCR NEGATIVE  NEGATIVE Final   Comment:            The GeneXpert MRSA Assay (FDA     approved for NASAL specimens     only), is one component of a     comprehensive MRSA colonization     surveillance program. It is not     intended to diagnose MRSA     infection nor to guide or     monitor treatment for     MRSA infections.  CULTURE, BLOOD (ROUTINE  X 2)     Status:  None   Collection Time    04/09/13  2:00 PM      Result Value Range Status   Specimen Description BLOOD RIGHT HAND   Final   Special Requests BOTTLES DRAWN AEROBIC AND ANAEROBIC 10CC   Final   Culture  Setup Time 04/09/2013 18:46   Final   Culture     Final   Value: ESCHERICHIA COLI     Note: Gram Stain Report Called to,Read Back By and Verified With: ZYRIL AT 0445 04/10/13 SNOLO   Report Status 04/12/2013 FINAL   Final   Organism ID, Bacteria ESCHERICHIA COLI   Final  CULTURE, BLOOD (ROUTINE X 2)     Status: None   Collection Time    04/09/13  2:08 PM      Result Value Range Status   Specimen Description BLOOD RIGHT FOREARM   Final   Special Requests BOTTLES DRAWN AEROBIC ONLY 10CC   Final   Culture  Setup Time 04/09/2013 18:46   Final   Culture     Final   Value: ESCHERICHIA COLI     Note: SUSCEPTIBILITIES PERFORMED ON PREVIOUS CULTURE WITHIN THE LAST 5 DAYS.     Note: Gram Stain Report Called to,Read Back By and Verified With: ZYRIL AT 0446 04/10/13 BY SNOLO   Report Status 04/12/2013 FINAL   Final     Labs: Basic Metabolic Panel:  Recent Labs Lab 04/09/13 0420 04/11/13 0432 04/12/13 0430 04/13/13 0555 04/15/13 0435  NA 135 135 137 136 139  K 3.5 3.6 3.5 3.3* 3.9  CL 96 95* 98 100 102  CO2 28 27 25 26 26   GLUCOSE 127* 152* 160* 160* 155*  BUN 55* 82* 90* 87* 75*  CREATININE 3.22* 3.62* 3.35* 3.31* 2.94*  CALCIUM 8.9 9.0 9.1 9.0 8.7   Liver Function Tests:  Recent Labs Lab 04/11/13 0432 04/12/13 0430  AST 97* 39*  ALT 181* 128*  ALKPHOS 72 100  BILITOT 0.7 0.7  PROT 6.0 6.0  ALBUMIN 2.2* 2.1*   No results found for this basename: LIPASE, AMYLASE,  in the last 168 hours No results found for this basename: AMMONIA,  in the last 168 hours CBC:  Recent Labs Lab 04/09/13 1422 04/10/13 0500 04/11/13 0432 04/13/13 0555  WBC 15.0* 8.9 7.2 6.6  NEUTROABS 13.7* 8.1*  --   --   HGB 10.2* 10.0* 10.0* 10.7*  HCT 30.2* 29.3* 29.1* 31.0*  MCV 87.8 87.2 85.3 84.7   PLT 81* 63* 62* 66*   Cardiac Enzymes:  Recent Labs Lab 04/08/13 1548  TROPONINI <0.30   BNP: BNP (last 3 results)  Recent Labs  07/23/12 0433 07/31/12 0842 04/07/13 1706  PROBNP 30452.0* 1518.0* 16363.0*   CBG:  Recent Labs Lab 04/14/13 0614 04/14/13 1119 04/14/13 1622 04/14/13 2146 04/15/13 0620  GLUCAP 157* 213* 168* 178* 144*       Signed:  Penny Pia  [facility] 04/15/2013, 10:02 AM

## 2013-04-16 DIAGNOSIS — A419 Sepsis, unspecified organism: Secondary | ICD-10-CM | POA: Diagnosis not present

## 2013-04-16 DIAGNOSIS — I10 Essential (primary) hypertension: Secondary | ICD-10-CM | POA: Diagnosis not present

## 2013-04-16 DIAGNOSIS — I509 Heart failure, unspecified: Secondary | ICD-10-CM | POA: Diagnosis not present

## 2013-04-16 DIAGNOSIS — N390 Urinary tract infection, site not specified: Secondary | ICD-10-CM | POA: Diagnosis not present

## 2013-04-17 ENCOUNTER — Telehealth: Payer: Self-pay | Admitting: Oncology

## 2013-04-21 ENCOUNTER — Telehealth: Payer: Self-pay | Admitting: Dietician

## 2013-04-22 ENCOUNTER — Telehealth: Payer: Self-pay | Admitting: *Deleted

## 2013-04-22 NOTE — Telephone Encounter (Signed)
Nurse bechel, from [facility] calling to say patient's wife cancelled his lab and injection appt, she did not know that [facility] provided transportation to  Dr appts. Lm on vm for scheduling to re-schedule appt, tomorrow if possible.

## 2013-04-23 ENCOUNTER — Ambulatory Visit: Payer: Medicare Other

## 2013-04-23 ENCOUNTER — Ambulatory Visit (HOSPITAL_BASED_OUTPATIENT_CLINIC_OR_DEPARTMENT_OTHER): Payer: Medicare Other | Admitting: Lab

## 2013-04-23 ENCOUNTER — Other Ambulatory Visit: Payer: Medicare Other | Admitting: Lab

## 2013-04-23 ENCOUNTER — Telehealth: Payer: Self-pay | Admitting: Oncology

## 2013-04-23 ENCOUNTER — Ambulatory Visit (HOSPITAL_BASED_OUTPATIENT_CLINIC_OR_DEPARTMENT_OTHER): Payer: Medicare Other

## 2013-04-23 DIAGNOSIS — I509 Heart failure, unspecified: Secondary | ICD-10-CM | POA: Diagnosis not present

## 2013-04-23 DIAGNOSIS — D649 Anemia, unspecified: Secondary | ICD-10-CM

## 2013-04-23 DIAGNOSIS — D72829 Elevated white blood cell count, unspecified: Secondary | ICD-10-CM | POA: Diagnosis not present

## 2013-04-23 DIAGNOSIS — E876 Hypokalemia: Secondary | ICD-10-CM | POA: Diagnosis not present

## 2013-04-23 DIAGNOSIS — N179 Acute kidney failure, unspecified: Secondary | ICD-10-CM

## 2013-04-23 LAB — CBC WITH DIFFERENTIAL/PLATELET
Eosinophils Absolute: 0.1 10*3/uL (ref 0.0–0.5)
MCV: 91.2 fL (ref 79.3–98.0)
MONO#: 0.5 10*3/uL (ref 0.1–0.9)
MONO%: 6.9 % (ref 0.0–14.0)
NEUT#: 5.4 10*3/uL (ref 1.5–6.5)
RBC: 3.08 10*6/uL — ABNORMAL LOW (ref 4.20–5.82)
RDW: 18 % — ABNORMAL HIGH (ref 11.0–14.6)
WBC: 7.3 10*3/uL (ref 4.0–10.3)
lymph#: 1.3 10*3/uL (ref 0.9–3.3)

## 2013-04-23 MED ORDER — DARBEPOETIN ALFA-POLYSORBATE 300 MCG/0.6ML IJ SOLN
300.0000 ug | Freq: Once | INTRAMUSCULAR | Status: AC
  Administered 2013-04-23: 300 ug via SUBCUTANEOUS
  Filled 2013-04-23: qty 0.6

## 2013-04-23 NOTE — Telephone Encounter (Signed)
Per desk nurse appt for lb/inj today that was cx'd by pt's wife has been restored. S/w Bechel @ [facility] (per desk nurse) and they can have pt here by 1pm.

## 2013-04-30 DIAGNOSIS — I509 Heart failure, unspecified: Secondary | ICD-10-CM | POA: Diagnosis not present

## 2013-04-30 DIAGNOSIS — N390 Urinary tract infection, site not specified: Secondary | ICD-10-CM | POA: Diagnosis not present

## 2013-04-30 DIAGNOSIS — R609 Edema, unspecified: Secondary | ICD-10-CM | POA: Diagnosis not present

## 2013-04-30 DIAGNOSIS — D649 Anemia, unspecified: Secondary | ICD-10-CM | POA: Diagnosis not present

## 2013-05-09 DIAGNOSIS — N400 Enlarged prostate without lower urinary tract symptoms: Secondary | ICD-10-CM | POA: Diagnosis not present

## 2013-05-09 DIAGNOSIS — R627 Adult failure to thrive: Secondary | ICD-10-CM | POA: Diagnosis not present

## 2013-05-09 DIAGNOSIS — D649 Anemia, unspecified: Secondary | ICD-10-CM | POA: Diagnosis not present

## 2013-05-09 DIAGNOSIS — R609 Edema, unspecified: Secondary | ICD-10-CM | POA: Diagnosis not present

## 2013-05-12 DIAGNOSIS — Z8701 Personal history of pneumonia (recurrent): Secondary | ICD-10-CM | POA: Diagnosis not present

## 2013-05-12 DIAGNOSIS — I509 Heart failure, unspecified: Secondary | ICD-10-CM | POA: Diagnosis not present

## 2013-05-12 DIAGNOSIS — I5022 Chronic systolic (congestive) heart failure: Secondary | ICD-10-CM | POA: Diagnosis not present

## 2013-05-12 DIAGNOSIS — N189 Chronic kidney disease, unspecified: Secondary | ICD-10-CM | POA: Diagnosis not present

## 2013-05-12 DIAGNOSIS — I2510 Atherosclerotic heart disease of native coronary artery without angina pectoris: Secondary | ICD-10-CM | POA: Diagnosis not present

## 2013-05-13 DIAGNOSIS — I509 Heart failure, unspecified: Secondary | ICD-10-CM | POA: Diagnosis not present

## 2013-05-13 DIAGNOSIS — I5022 Chronic systolic (congestive) heart failure: Secondary | ICD-10-CM | POA: Diagnosis not present

## 2013-05-13 DIAGNOSIS — Z8701 Personal history of pneumonia (recurrent): Secondary | ICD-10-CM | POA: Diagnosis not present

## 2013-05-13 DIAGNOSIS — I2510 Atherosclerotic heart disease of native coronary artery without angina pectoris: Secondary | ICD-10-CM | POA: Diagnosis not present

## 2013-05-13 DIAGNOSIS — N189 Chronic kidney disease, unspecified: Secondary | ICD-10-CM | POA: Diagnosis not present

## 2013-05-14 ENCOUNTER — Other Ambulatory Visit (HOSPITAL_BASED_OUTPATIENT_CLINIC_OR_DEPARTMENT_OTHER): Payer: Medicare Other | Admitting: Lab

## 2013-05-14 ENCOUNTER — Telehealth: Payer: Self-pay | Admitting: *Deleted

## 2013-05-14 ENCOUNTER — Ambulatory Visit: Payer: Medicare Other

## 2013-05-14 DIAGNOSIS — D649 Anemia, unspecified: Secondary | ICD-10-CM | POA: Diagnosis not present

## 2013-05-14 DIAGNOSIS — I5022 Chronic systolic (congestive) heart failure: Secondary | ICD-10-CM | POA: Diagnosis not present

## 2013-05-14 DIAGNOSIS — N189 Chronic kidney disease, unspecified: Secondary | ICD-10-CM | POA: Diagnosis not present

## 2013-05-14 DIAGNOSIS — I509 Heart failure, unspecified: Secondary | ICD-10-CM | POA: Diagnosis not present

## 2013-05-14 DIAGNOSIS — I2510 Atherosclerotic heart disease of native coronary artery without angina pectoris: Secondary | ICD-10-CM | POA: Diagnosis not present

## 2013-05-14 DIAGNOSIS — N179 Acute kidney failure, unspecified: Secondary | ICD-10-CM

## 2013-05-14 DIAGNOSIS — Z8701 Personal history of pneumonia (recurrent): Secondary | ICD-10-CM | POA: Diagnosis not present

## 2013-05-14 LAB — CBC WITH DIFFERENTIAL/PLATELET
Eosinophils Absolute: 0 10*3/uL (ref 0.0–0.5)
LYMPH%: 21 % (ref 14.0–49.0)
MONO#: 0.5 10*3/uL (ref 0.1–0.9)
NEUT#: 5 10*3/uL (ref 1.5–6.5)
Platelets: 148 10*3/uL (ref 140–400)
RBC: 3.57 10*6/uL — ABNORMAL LOW (ref 4.20–5.82)
WBC: 7.1 10*3/uL (ref 4.0–10.3)

## 2013-05-14 MED ORDER — DARBEPOETIN ALFA-POLYSORBATE 300 MCG/0.6ML IJ SOLN: 300.0000 ug | Freq: Once | INTRAMUSCULAR | Status: DC

## 2013-05-14 NOTE — Telephone Encounter (Signed)
I spoke with Angie about fax communication stating pt wife had restarted him on his Carvedilol & Hydralazine once he came home from rehab. Reviewed discharge instructions from hospital regarding restarting beta blocker & diuretic. States pt blood pressure has been within normal. & wife is adamant pt will take medications.  She is aware Dr. Daleen Squibb will review when in office tomorrow. Mylo Red RN

## 2013-05-15 DIAGNOSIS — I509 Heart failure, unspecified: Secondary | ICD-10-CM | POA: Diagnosis not present

## 2013-05-15 DIAGNOSIS — I2510 Atherosclerotic heart disease of native coronary artery without angina pectoris: Secondary | ICD-10-CM | POA: Diagnosis not present

## 2013-05-15 DIAGNOSIS — N189 Chronic kidney disease, unspecified: Secondary | ICD-10-CM | POA: Diagnosis not present

## 2013-05-15 DIAGNOSIS — I5022 Chronic systolic (congestive) heart failure: Secondary | ICD-10-CM | POA: Diagnosis not present

## 2013-05-15 DIAGNOSIS — Z8701 Personal history of pneumonia (recurrent): Secondary | ICD-10-CM | POA: Diagnosis not present

## 2013-05-15 NOTE — Telephone Encounter (Signed)
lmovm Dr. Daleen Squibb reviewed and pt okay to continue Coreg & hydralazine as long as blood pressure is stable & pt is not on 2 beta blockers. Have added medications back to pt list as he was taking them prior to ED visit.  Will forward to triage if further clarification by [facility] nurse is needed.  Mylo Red RN

## 2013-05-16 DIAGNOSIS — Z8701 Personal history of pneumonia (recurrent): Secondary | ICD-10-CM | POA: Diagnosis not present

## 2013-05-16 DIAGNOSIS — I5022 Chronic systolic (congestive) heart failure: Secondary | ICD-10-CM | POA: Diagnosis not present

## 2013-05-16 DIAGNOSIS — I509 Heart failure, unspecified: Secondary | ICD-10-CM | POA: Diagnosis not present

## 2013-05-16 DIAGNOSIS — I2510 Atherosclerotic heart disease of native coronary artery without angina pectoris: Secondary | ICD-10-CM | POA: Diagnosis not present

## 2013-05-16 DIAGNOSIS — N189 Chronic kidney disease, unspecified: Secondary | ICD-10-CM | POA: Diagnosis not present

## 2013-05-16 NOTE — Telephone Encounter (Signed)
Follow-up:     Called in wanting to know if the patient would still be able to take an extra lasix when he was experiencing weight gain.  Also would like to know what the weight gain limit was for when he needed to take it.  Please call back.

## 2013-05-16 NOTE — Telephone Encounter (Signed)
Called home nurse Brayton Caves) and advised not to give any extra lasix at this time. He has gained 2 pounds over the past week but measurements are unchanged and he is not SOB. Advised that he had renal dysfunction in the hospital so we need to be very carefull with his fluid status. Has an appointment on 6/11 with the cardiology PA.

## 2013-05-18 ENCOUNTER — Other Ambulatory Visit: Payer: Self-pay | Admitting: Nurse Practitioner

## 2013-05-19 DIAGNOSIS — I5022 Chronic systolic (congestive) heart failure: Secondary | ICD-10-CM | POA: Diagnosis not present

## 2013-05-19 DIAGNOSIS — Z8701 Personal history of pneumonia (recurrent): Secondary | ICD-10-CM | POA: Diagnosis not present

## 2013-05-19 DIAGNOSIS — I2510 Atherosclerotic heart disease of native coronary artery without angina pectoris: Secondary | ICD-10-CM | POA: Diagnosis not present

## 2013-05-19 DIAGNOSIS — I509 Heart failure, unspecified: Secondary | ICD-10-CM | POA: Diagnosis not present

## 2013-05-19 DIAGNOSIS — N189 Chronic kidney disease, unspecified: Secondary | ICD-10-CM | POA: Diagnosis not present

## 2013-05-20 ENCOUNTER — Telehealth: Payer: Self-pay | Admitting: Cardiology

## 2013-05-20 DIAGNOSIS — I2510 Atherosclerotic heart disease of native coronary artery without angina pectoris: Secondary | ICD-10-CM | POA: Diagnosis not present

## 2013-05-20 DIAGNOSIS — I5022 Chronic systolic (congestive) heart failure: Secondary | ICD-10-CM | POA: Diagnosis not present

## 2013-05-20 DIAGNOSIS — Z8701 Personal history of pneumonia (recurrent): Secondary | ICD-10-CM | POA: Diagnosis not present

## 2013-05-20 DIAGNOSIS — I509 Heart failure, unspecified: Secondary | ICD-10-CM | POA: Diagnosis not present

## 2013-05-20 DIAGNOSIS — N189 Chronic kidney disease, unspecified: Secondary | ICD-10-CM | POA: Diagnosis not present

## 2013-05-20 NOTE — Telephone Encounter (Signed)
New problem  Pt has temp of 99.2 & has a 7lb wt increase in pass 3 days

## 2013-05-20 NOTE — Telephone Encounter (Signed)
Pt's Physical therapy Tresa Endo called, because she said pt has gained 6.2 lbs in the last 3 days, has 2 + pitting edema before it was  1 +,  abdomen round and tight, pt denies SOB, temp 99.2 orally. Pt has an appointment to be seen tomorrow 05/21/13 at 1:45 PM  in [facility] clinic. I spoke with pt's wife which states pt was discharge from [place] to a rehab center on 04/15/13 with no Lasix medication ; Pt's wife said pt was taken 40 mg lasix twice a day prior going to the hospital. Two weeks later  lasix 40 mg once a day was started after pt's legs  started to swell and abdomen was becoming round and tight. Pt's wife states pt weigh 135.6 on June 1 st, today he weighs 142.1 lbs. On pt's last office visit with Dr. Daleen Squibb on 01/08/13 pt's weight was 152 lbs.  Yesterday 6/9 pt's wife started pt on 40 mg lasix twice a day. Pt's visiting nurse came to pt's house at 12:30 Pm, according to the nurse assessment; Pt denies SOB lungs has just a few crackles one or two in lower left lung; no work of breathing abdomen round but soft no distention, RR 18 to 20 breaths per minute with no work of breathing. Pedal pulses 1+ around ankles, BP 120/58 pulse 60 regular. According to nurse pt's  increased weight could be because of improved appetite after being home. Nurse Liborio Nixon states pt is  stable and can wait till tomorrow's appointment with Jacolyn Reedy PA. Pt and wife aware of appointment at 1:45 Pm.

## 2013-05-21 ENCOUNTER — Ambulatory Visit (INDEPENDENT_AMBULATORY_CARE_PROVIDER_SITE_OTHER): Payer: Medicare Other | Admitting: Physician Assistant

## 2013-05-21 ENCOUNTER — Encounter: Payer: Self-pay | Admitting: Physician Assistant

## 2013-05-21 VITALS — BP 118/62 | HR 57 | Ht 68.0 in | Wt 148.1 lb

## 2013-05-21 DIAGNOSIS — N189 Chronic kidney disease, unspecified: Secondary | ICD-10-CM

## 2013-05-21 DIAGNOSIS — N289 Disorder of kidney and ureter, unspecified: Secondary | ICD-10-CM | POA: Diagnosis not present

## 2013-05-21 DIAGNOSIS — I509 Heart failure, unspecified: Secondary | ICD-10-CM | POA: Diagnosis not present

## 2013-05-21 DIAGNOSIS — I10 Essential (primary) hypertension: Secondary | ICD-10-CM | POA: Diagnosis not present

## 2013-05-21 DIAGNOSIS — I5022 Chronic systolic (congestive) heart failure: Secondary | ICD-10-CM | POA: Diagnosis not present

## 2013-05-21 DIAGNOSIS — N179 Acute kidney failure, unspecified: Secondary | ICD-10-CM

## 2013-05-21 LAB — BASIC METABOLIC PANEL
CO2: 27 mEq/L (ref 19–32)
Calcium: 9 mg/dL (ref 8.4–10.5)
Glucose, Bld: 164 mg/dL — ABNORMAL HIGH (ref 70–99)
Potassium: 4.1 mEq/L (ref 3.5–5.1)
Sodium: 135 mEq/L (ref 135–145)

## 2013-05-21 MED ORDER — FUROSEMIDE 40 MG PO TABS: 40.0000 mg | ORAL_TABLET | Freq: Every day | ORAL | Status: DC

## 2013-05-21 NOTE — Progress Notes (Signed)
HPI:   This is an 79 yr old male patient who was recently hospitalized with acute on chronic systolic heart failure, sepsis(E Coli), and  acute on chronic renal failure. He was discharged to a rehabilitation facility without Lasix because of hypotension secondary to sepsis. Unfortunately his weight has gradually increased. He was discharged from the rehabilitation facility and his wife has been giving him Lasix 40 mg once daily since June 1. His dry weight is supposedly 137-138 pounds. He was up to 148 pounds on our scales 145 on his scales. He denies dyspnea or dyspnea on exertion but has developed some leg edema. He has not had lab work recently. His last creatinine was 2.94 The patient has a history of ischemic rami up the ejection fraction 25% he is not on ACE inhibitors or ARB because of his of his chronic kidney disease. He had CABG in 2013. He also has a history of pericardial aortic valve replacement 2013, and moderate MR. He's been intolerant to statins.    Allergies: -- Statins    --  Pain/weakness in legs  Current Outpatient Prescriptions on File Prior to Visit: aspirin EC 325 MG tablet, Take 325 mg by mouth every morning. , Disp: , Rfl:  carvedilol (COREG) 25 MG tablet, Take 25 mg by mouth 2 (two) times daily with a meal., Disp: , Rfl:  cholecalciferol (VITAMIN D) 1000 UNITS tablet, Take 1,000 Units by mouth every morning. , Disp: , Rfl:  isosorbide mononitrate (IMDUR) 60 MG 24 hr tablet, Take 1 tablet (60 mg total) by mouth daily., Disp: 30 tablet, Rfl: 0 Multiple Vitamin (MULTIVITAMIN) capsule, Take 1 capsule by mouth every morning. , Disp: , Rfl:  nitroGLYCERIN (NITROSTAT) 0.4 MG SL tablet, Place 1 tablet (0.4 mg total) under the tongue every 5 (five) minutes x 3 doses as needed. For chest pain, Disp: 25 tablet, Rfl: 5 omeprazole (PRILOSEC) 20 MG capsule, Take 20 mg by mouth every morning. , Disp: , Rfl:  Prenatal Vit-Fe Fumarate-FA (MULTIVITAMIN-PRENATAL) 27-0.8 MG TABS, Take 1  tablet by mouth every evening., Disp: , Rfl:  Tamsulosin HCl (FLOMAX) 0.4 MG CAPS, Take 1 capsule (0.4 mg total) by mouth daily after supper., Disp: 30 capsule, Rfl: 3 traMADol (ULTRAM) 50 MG tablet, Take 0.5 tablets (25 mg total) by mouth every 6 (six) hours as needed for pain., Disp: 30 tablet, Rfl: 0 Travoprost, BAK Free, (TRAVATAN) 0.004 % SOLN ophthalmic solution, Place 1 drop into both eyes 2 (two) times daily., Disp: , Rfl:  cyclobenzaprine (FLEXERIL) 5 MG tablet, Take 1 tablet (5 mg total) by mouth at bedtime., Disp: 7 tablet, Rfl: 0 hydrALAZINE (APRESOLINE) 25 MG tablet, Take 25 mg by mouth 3 (three) times daily., Disp: , Rfl:  [DISCONTINUED] metFORMIN (GLUCOPHAGE) 500 MG tablet, Take 500-1,000 mg by mouth. 2 tabs in the am, 1 tab in the pm, Disp: , Rfl:  [DISCONTINUED] potassium chloride SA (K-DUR,KLOR-CON) 20 MEQ tablet, Take 1 tablet (20 mEq total) by mouth daily., Disp: 14 tablet, Rfl: 3  No current facility-administered medications on file prior to visit.   Past Medical History:   History of colon cancer                                        Comment:s/p colon resection   Aortic stenosis  Comment:a. s/p AVR with 23mm Edwards pericardial valve               02/07/12 - post-op course complicated by pleural              effusion requring thoracentesis/leg cellulitis               03/2012.   CAD (coronary artery disease)                                  Comment:a. s/p NSTEMI 12/2011:  LHC - Ostial left main               20%, ostial LAD 50%, mid 60-70%, ostial D1 40%               and mid 40%, D2 70%, ostial circumflex               occluded, ostial RCA 80-90%, LVEDP was 42. b.                s/p CABG x 3 at time of AVR (LiMA-LAD, SVG-2nd               daigonal, SVG-PDA) 02/07/12 (post-op course               noted above).   Ischemic cardiomyopathy                                      Chronic systolic heart failure                                  Comment:a. TEE 01/2012: EF 25-30%, diffuse hypokinesis.               b. Not on ACEI due to renal insufficiency.;  c.              follow up  echo 08/06/12: EF 25%, mod diast               dysfxn, AVR ok, mild MR, mod LAE, mild RAE,               mild to mod RV systolic dysfunction   HLD (hyperlipidemia)                                         GERD (gastroesophageal reflux disease)                       Chronic kidney disease                                       Pleural effusion                                               Comment:a. post-operatively after AVR/CABG s/p               thoracentesis 03/2012 yielding 1L serosanguinous  fluid.   Diabetes mellitus                                              Comment:borderline   Blood transfusion                                              Comment:NO REACTION TO TRANSFUSION   Cellulitis                                                     Comment:a. RLE cellulitis 2 months post-operatively               after AVR/CABG - serratia marcessans, tx with               I&D/antibiotics   Mitral regurgitation                                           Comment:Moderate by TEE 01/2012   Flash pulmonary edema                                          Comment:Post-cath 12/2011, went into acute pulm edema               requiring IV lasix and intubation   Baker's cyst                                    07/23/12       Comment:Incidental finding of LE venous dopplers   Cataract                                                       Comment:right eye, hx of   HTN (hypertension)                                             Comment:primary, Dr. Dorris Fetch   Myocardial infarction                           12/2011       CHF (congestive heart failure)                               Stroke  10/01          Comment:RIght Leg weakness   Cancer                                          '90's           Comment:Colon   Anemia                                                      Past Surgical History:   COLON RESECTION                                  1996         ESOPHAGEAL DILATION                                           COLONOSCOPY                                                   CATARACT EXTRACTION                                             Comment:rt   CARDIAC CATHETERIZATION                                         Comment:1.3.13  stopped breathing, put on ventilator               for 5-6 days   TONSILLECTOMY                                                 AORTIC VALVE REPLACEMENT                         02/07/2012      Comment:Procedure: AORTIC VALVE REPLACEMENT (AVR);                Surgeon: [name], MD;  Location: [facility] OR;               Service: Open Heart Surgery;  Laterality: N/A;   CORONARY ARTERY BYPASS GRAFT                     02/07/2012      Comment:Procedure: CORONARY ARTERY BYPASS GRAFTING               (CABG);  Surgeon: [name], MD;                Location: [facility] OR;  Service: Open Heart Surgery;  Laterality: N/A;  CABG x three; using right leg              greater saphenous vein harvested endoscopically   CHEST TUBE INSERTION                             07/24/2012      Comment:Procedure: INSERTION PLEURAL DRAINAGE CATHETER;              Surgeon: [name], MD;  Location: [facility] OR;               Service: Thoracic;  Laterality: Left;   TALC PLEURODESIS                                 08/30/2012      Comment:Procedure: Lurlean Nanny;  Surgeon: [name], MD;  Location: [facility] OR;  Service:               Thoracic;  Laterality: Left;  INSERTION OF TALC              VIA LEFT PLEURX   TALC PLEURODESIS                                 09/12/2012      Comment:Procedure: Lurlean Nanny;  Surgeon: [name], MD;  Location: [facility] OR;  Service:               Thoracic;  Laterality: Left;   REMOVAL OF PLEURAL DRAINAGE  CATHETER             09/27/2012     Comment:Procedure: REMOVAL OF PLEURAL DRAINAGE               CATHETER;  Surgeon: [name], MD;                Location: [facility] OR;  Service: Thoracic;                Laterality: Left;   EYE SURGERY                                      2009           Comment:cataract removed from right eye  Review of patient's family history indicates:   Cancer                         Mother                   Heart attack                   Father                   Mental illness                 Brother                  Kidney failure  Brother                  Social History   Marital Status: Married             Spouse Name:                      Years of Education:                 Number of children:             Occupational History   None on file  Social History Main Topics   Smoking Status: Never Smoker                     Smokeless Status: Never Used                       Alcohol Use: No             Drug Use: No             Sexual Activity: Not Currently      Other Topics            Concern   None on file  Social History Narrative   None on file    ROS: Walking with a walker, gradually gaining his strength back, See history of present illness otherwise negative   PHYSICAL EXAM: Well-nournished, in no acute distress. Neck: Increased JVD and HJR, No Bruit, or thyroid enlargement  Lungs: Decreased breath sounds at the left base with fine crackles clear on the right No tachypnea  Cardiovascular: RRR, PMI not displaced, heart sounds distant with 2/6 systolic murmur cholesterol border, no gallops, bruit, thrill, or heave.  Abdomen: BS normal. Soft without organomegaly, masses, lesions or tenderness.  Extremities: +1 edema bilaterally in the ankles otherwise lower extremities without cyanosis or clubbing. Good distal pulses bilateral  SKin: Warm, no lesions or rashes   Musculoskeletal: No deformities  Neuro: no focal signs  BP  118/62  Pulse 57  Ht 5\' 8"  (1.727 m)  Wt 148 lb 1.9 oz (67.187 kg)  BMI 22.53 kg/m2   EKG: Sinus bradycardia 57 beats per minute and LVH nonspecific ST changes poor baseline tracing

## 2013-05-21 NOTE — Assessment & Plan Note (Signed)
Patient has increase in heart failure since he's been home from the hospital. His weight is up about 10 pounds although he is not short of breath. I will increase his Lasix to 40 mg twice a day for 3 days then decrease to 40 mg daily. Will check renal function today. We have scheduled him to be seen in the heart failure clinic next week to have close followup with his renal insufficiency. He will also need labs next week.

## 2013-05-21 NOTE — Patient Instructions (Addendum)
Your physician recommends that you keep your follow-up appointment with [facility]   Your physician has recommended you make the following change in your medication: START Lasix ( 40 mg) two time a day for 3 days than decrease to ( 40 mg ) daily. Medication was sent into your pharmacy  Your physician recommends that you have lab work today: bmet we will call you back with your results

## 2013-05-21 NOTE — Assessment & Plan Note (Signed)
Follow-up labs today

## 2013-05-21 NOTE — Assessment & Plan Note (Signed)
Stable

## 2013-05-22 ENCOUNTER — Telehealth (HOSPITAL_COMMUNITY): Payer: Self-pay | Admitting: *Deleted

## 2013-05-22 DIAGNOSIS — Z8701 Personal history of pneumonia (recurrent): Secondary | ICD-10-CM | POA: Diagnosis not present

## 2013-05-22 DIAGNOSIS — I5022 Chronic systolic (congestive) heart failure: Secondary | ICD-10-CM | POA: Diagnosis not present

## 2013-05-22 DIAGNOSIS — I509 Heart failure, unspecified: Secondary | ICD-10-CM | POA: Diagnosis not present

## 2013-05-22 DIAGNOSIS — N189 Chronic kidney disease, unspecified: Secondary | ICD-10-CM | POA: Diagnosis not present

## 2013-05-22 DIAGNOSIS — I2510 Atherosclerotic heart disease of native coronary artery without angina pectoris: Secondary | ICD-10-CM | POA: Diagnosis not present

## 2013-05-22 NOTE — Telephone Encounter (Signed)
She called concerned about pt's wt gain, pt is sch to see [place] as a new pt on Wed 6/18, he was seen by [facility] yesterday and lasix was increased, however she states pt had gained more weight today, discussed w/Dr Bensimhon he would like pt to have 40 mg of IV lasix today.  Spoke w/Ambre Tiburcio Pea, RN at [facility] and gave order for 1 time dose of IV Lasix today

## 2013-05-23 ENCOUNTER — Telehealth (HOSPITAL_COMMUNITY): Payer: Self-pay | Admitting: Adult Health

## 2013-05-23 ENCOUNTER — Telehealth (HOSPITAL_COMMUNITY): Payer: Self-pay | Admitting: *Deleted

## 2013-05-23 DIAGNOSIS — I5022 Chronic systolic (congestive) heart failure: Secondary | ICD-10-CM | POA: Diagnosis not present

## 2013-05-23 DIAGNOSIS — Z8701 Personal history of pneumonia (recurrent): Secondary | ICD-10-CM | POA: Diagnosis not present

## 2013-05-23 DIAGNOSIS — I509 Heart failure, unspecified: Secondary | ICD-10-CM

## 2013-05-23 DIAGNOSIS — I2510 Atherosclerotic heart disease of native coronary artery without angina pectoris: Secondary | ICD-10-CM | POA: Diagnosis not present

## 2013-05-23 DIAGNOSIS — N189 Chronic kidney disease, unspecified: Secondary | ICD-10-CM | POA: Diagnosis not present

## 2013-05-23 DIAGNOSIS — N289 Disorder of kidney and ureter, unspecified: Secondary | ICD-10-CM

## 2013-05-23 MED ORDER — POTASSIUM CHLORIDE CRYS ER 20 MEQ PO TBCR: 20.0000 meq | EXTENDED_RELEASE_TABLET | Freq: Every day | ORAL | Status: DC

## 2013-05-23 MED ORDER — FUROSEMIDE 40 MG PO TABS: 80.0000 mg | ORAL_TABLET | Freq: Two times a day (BID) | ORAL | Status: DC

## 2013-05-23 MED ORDER — METOLAZONE 2.5 MG PO TABS: 2.5000 mg | ORAL_TABLET | ORAL | Status: DC

## 2013-05-23 NOTE — Telephone Encounter (Signed)
Mrs Colmenares  was unsure about the diuretic regimen.  Per Dr Ivette Loyal recommendations: take lasix 80 mg in am and 80 mg in pm.   Mrs Stancil verbalized understanding.   Leeann Bady 3:50 PM

## 2013-05-23 NOTE — Telephone Encounter (Signed)
Brayton Caves, RN called early this AM to let us know she had drawn labs and would run the state, she states pt does have JVD, abd and LE edema, his wt is only down 1 lb from yesterday.  Received labs bun 44, cr 2.24, k 4.2 Dr Gala Romney has reviewed will increase lasix to 80 mg Twice daily and give metolazone 2.5 mg daily for 2 days start KCL 20 daily, keep appt with [place] 6/18 and will recheck labs then, pt's wife is aware and verbalizes understanding, rx sent into pharmacy.  Also called Brayton Caves and made her aware of changes

## 2013-05-26 ENCOUNTER — Telehealth (HOSPITAL_COMMUNITY): Payer: Self-pay | Admitting: *Deleted

## 2013-05-26 NOTE — Telephone Encounter (Signed)
Pt's wife called this AM concerned about pt's wt, wt today was 129.1, 6/11 145 lb, 6/13 145 lb, 6/14 138 lb and 6/15 135 lb, she states pt feels very tired but no dizziness or lightheadedness, it is unclear whether pt was given metolazone 2 or 3 days.  Info reviewed with Dr Gala Romney will hold lasix and potassium until appt Wed 6/18, may need lasix 80 mg Twice daily as maintenance dose

## 2013-05-27 ENCOUNTER — Other Ambulatory Visit: Payer: Self-pay | Admitting: Cardiology

## 2013-05-27 DIAGNOSIS — I2510 Atherosclerotic heart disease of native coronary artery without angina pectoris: Secondary | ICD-10-CM | POA: Diagnosis not present

## 2013-05-27 DIAGNOSIS — N189 Chronic kidney disease, unspecified: Secondary | ICD-10-CM | POA: Diagnosis not present

## 2013-05-27 DIAGNOSIS — I5022 Chronic systolic (congestive) heart failure: Secondary | ICD-10-CM | POA: Diagnosis not present

## 2013-05-27 DIAGNOSIS — I509 Heart failure, unspecified: Secondary | ICD-10-CM | POA: Diagnosis not present

## 2013-05-27 DIAGNOSIS — Z8701 Personal history of pneumonia (recurrent): Secondary | ICD-10-CM | POA: Diagnosis not present

## 2013-05-28 ENCOUNTER — Encounter (HOSPITAL_COMMUNITY): Payer: Self-pay

## 2013-05-28 ENCOUNTER — Ambulatory Visit (HOSPITAL_COMMUNITY)
Admission: RE | Admit: 2013-05-28 | Discharge: 2013-05-28 | Disposition: A | Discharge status: Home / Self Care | Payer: Medicare Other | Source: Ambulatory Visit | Attending: Internal Medicine | Admitting: Internal Medicine

## 2013-05-28 VITALS — BP 122/46 | HR 60 | Wt 135.8 lb

## 2013-05-28 DIAGNOSIS — I2589 Other forms of chronic ischemic heart disease: Secondary | ICD-10-CM | POA: Diagnosis not present

## 2013-05-28 DIAGNOSIS — I252 Old myocardial infarction: Secondary | ICD-10-CM | POA: Diagnosis not present

## 2013-05-28 DIAGNOSIS — N289 Disorder of kidney and ureter, unspecified: Secondary | ICD-10-CM

## 2013-05-28 DIAGNOSIS — N189 Chronic kidney disease, unspecified: Secondary | ICD-10-CM | POA: Insufficient documentation

## 2013-05-28 DIAGNOSIS — Z79899 Other long term (current) drug therapy: Secondary | ICD-10-CM | POA: Diagnosis not present

## 2013-05-28 DIAGNOSIS — M7120 Synovial cyst of popliteal space [Baker], unspecified knee: Secondary | ICD-10-CM | POA: Diagnosis not present

## 2013-05-28 DIAGNOSIS — E785 Hyperlipidemia, unspecified: Secondary | ICD-10-CM | POA: Diagnosis not present

## 2013-05-28 DIAGNOSIS — E119 Type 2 diabetes mellitus without complications: Secondary | ICD-10-CM | POA: Insufficient documentation

## 2013-05-28 DIAGNOSIS — I059 Rheumatic mitral valve disease, unspecified: Secondary | ICD-10-CM | POA: Diagnosis not present

## 2013-05-28 DIAGNOSIS — Z85038 Personal history of other malignant neoplasm of large intestine: Secondary | ICD-10-CM | POA: Diagnosis not present

## 2013-05-28 DIAGNOSIS — I2510 Atherosclerotic heart disease of native coronary artery without angina pectoris: Secondary | ICD-10-CM | POA: Insufficient documentation

## 2013-05-28 DIAGNOSIS — I5022 Chronic systolic (congestive) heart failure: Secondary | ICD-10-CM | POA: Diagnosis not present

## 2013-05-28 DIAGNOSIS — I10 Essential (primary) hypertension: Secondary | ICD-10-CM | POA: Insufficient documentation

## 2013-05-28 DIAGNOSIS — Z952 Presence of prosthetic heart valve: Secondary | ICD-10-CM | POA: Diagnosis not present

## 2013-05-28 DIAGNOSIS — I509 Heart failure, unspecified: Secondary | ICD-10-CM | POA: Insufficient documentation

## 2013-05-28 DIAGNOSIS — Z951 Presence of aortocoronary bypass graft: Secondary | ICD-10-CM | POA: Diagnosis not present

## 2013-05-28 DIAGNOSIS — Z7982 Long term (current) use of aspirin: Secondary | ICD-10-CM | POA: Diagnosis not present

## 2013-05-28 LAB — BASIC METABOLIC PANEL
BUN: 56 mg/dL — ABNORMAL HIGH (ref 6–23)
CO2: 35 mEq/L — ABNORMAL HIGH (ref 19–32)
Chloride: 88 mEq/L — ABNORMAL LOW (ref 96–112)
Creatinine, Ser: 2.37 mg/dL — ABNORMAL HIGH (ref 0.50–1.35)

## 2013-05-28 MED ORDER — FUROSEMIDE 40 MG PO TABS: 80.0000 mg | ORAL_TABLET | Freq: Every day | ORAL | Status: DC

## 2013-05-28 NOTE — Assessment & Plan Note (Addendum)
      Reviewed most recent discharge summary. Volume status much improved over the last 5 days after up titration of diuretics over the last 5 days at home. Volume status stable. Instructed to take 80 mg of lasix daily and hold lasix and potassium f his  weight is less than 131 pounds. Also instructed to take an Iadditional 40 mg of lasix if his weight is 135 or greater. Reinforced daily weights, low salt food choices, and daily weights. Check BMET today. Follow up in 3 weeks.

## 2013-05-28 NOTE — Patient Instructions (Addendum)
1. Take 80 mg of lasix daily which is 2  40 mg tablets  2. Hold lasix if your weight is less than 131 pounds  3. If you hold lasix do not take potassium  4. If your weight is 135 or greater take an additional 40 mg of lasix which is 1 tablet  5. Do the following things EVERYDAY: 1) Weigh yourself in the morning before breakfast. Write it down and keep it in a log. 2) Take your medicines as prescribed 3) Eat low salt foods-Limit salt (sodium) to 2000 mg per day.  4) Stay as active as you can everyday Limit all fluids for the day to less than 2 liters  Follow up in 2-3 weeks

## 2013-05-28 NOTE — Progress Notes (Signed)
Patient ID: [name] [name], male   DOB: 1926-03-05, 79 y.o.   MRN: [id number] Referred   HPI: [name] [name] is referred to the HF clinic by Wende Bushy for assistance with volume overload.   [name] [name] is a 79 y.o. male with a PMHx of CAD, CABG, AVR, anemia, CKD last creatinine 2.2, Chronic systolic heart failure EF 40%  03/2013 and sepis E-Coli. He is not on an ACE or ARB due renal failure.   Admitted to [facility] 4/28 through 04/15/13  with volume overload. Diuresed with IV lasix. He was not discharged on diuretics. Discharge weight 139 pounds.   Evaluated by Wende Bushy 05/21/13 with recommendations to increase lasix to 40 mg bid and referred to HF clinic. On 05/22/13 he contacted the HF clinic due to weight gain he was given 40 mg IV lasix at home by [facility] with poor response. On 05/23/13 he was instructed to increase lasix 80 mg twice a day wit metolazone 2.5 mg for 2 days. On 05/26/13 he reported a weight loss from 145 down to 129 pounds at that point he was instructed to hold lasix.  He presents as a new patient today. Overall he is feeling much better with the fluid off. Complains of fatigue and occasional  dizziness. Denies SOB/PND/Orthopnea/dizziness. .Following low salt diet and limits fluid intake to < 2 liters per day. His wife manages his medications.     Review of Systems:     Cardiac Review of Systems: {Y] = yes [ ]  = no  Chest Pain [    ]  Resting SOB [   ] Exertional SOB  [  ]  Orthopnea [  ]   Pedal Edema [   ]    Palpitations [  ] Syncope  [  ]   Presyncope [   ]  General Review of Systems: [Y] = yes [  ]=no Constitional: recent weight change [ Y ]; anorexia [  ]; fatigue [ Y ]; nausea [  ]; night sweats [  ]; fever [  ]; or chills [  ];                                                                                                                                          Dental: poor dentition[  ]; Last Dentist visit:   Eye : blurred vision [  ]; diplopia [   ]; vision changes [  ];   Amaurosis fugax[  ]; Resp: cough [  ];  wheezing[  ];  hemoptysis[  ]; shortness of breath[  ]; paroxysmal nocturnal dyspnea[  ]; dyspnea on exertion[  ]; or orthopnea[  ];  GI:  gallstones[  ], vomiting[  ];  dysphagia[  ]; melena[  ];  hematochezia [  ]; heartburn[  ];   Hx of  Colonoscopy[  ]; GU: kidney stones [  ]; hematuria[  ];  dysuria [  ];  nocturia[  ];  history of     obstruction [  ];                 Skin: rash, swelling[  ];, hair loss[  ];  peripheral edema[  ];  or itching[  ]; Musculosketetal: myalgias[  ];  joint swelling[  ];  joint erythema[  ];  joint pain[ Y ];  back pain[  ];  Heme/Lymph: bruising[  ];  bleeding[  ];  anemia[  ];  Neuro: TIA[  ];  headaches[  ];  stroke[  ];  vertigo[  ];  seizures[  ];   paresthesias[  ];  difficulty walking[  ];  Psych:depression[  ]; anxiety[  ];  Endocrine: diabetes[  ];  thyroid dysfunction[  ];  Immunizations: Flu [  ]; Pneumococcal[  ];  Other:    Past Medical History  Diagnosis Date  . History of colon cancer     s/p colon resection  . Aortic stenosis     a. s/p AVR with 23mm Edwards pericardial valve 02/07/12 - post-op course complicated by pleural effusion requring thoracentesis/leg cellulitis 03/2012.  Marland Kitchen CAD (coronary artery disease)     a. s/p NSTEMI 12/2011:  LHC - Ostial left main 20%, ostial LAD 50%, mid 60-70%, ostial D1 40% and mid 40%, D2 70%, ostial circumflex occluded, ostial RCA 80-90%, LVEDP was 42. b.  s/p CABG x 3 at time of AVR (LiMA-LAD, SVG-2nd daigonal, SVG-PDA) 02/07/12 (post-op course noted above).  . Ischemic cardiomyopathy   . Chronic systolic heart failure     a. TEE 01/2012: EF 25-30%, diffuse hypokinesis. b. Not on ACEI due to renal insufficiency.;  c. follow up  echo 08/06/12: EF 25%, mod diast dysfxn, AVR ok, mild [name], mod LAE, mild RAE, mild to mod RV systolic dysfunction  . HLD (hyperlipidemia)   . GERD (gastroesophageal reflux disease)   . Chronic kidney disease   . Pleural effusion     a.  post-operatively after AVR/CABG s/p thoracentesis 03/2012 yielding 1L serosanguinous fluid.  . Diabetes mellitus     borderline  . Blood transfusion     NO REACTION TO TRANSFUSION  . Cellulitis     a. RLE cellulitis 2 months post-operatively after AVR/CABG - serratia marcessans, tx with I&D/antibiotics  . Mitral regurgitation     Moderate by TEE 01/2012  . Flash pulmonary edema     Post-cath 12/2011, went into acute pulm edema requiring IV lasix and intubation  . Baker's cyst 07/23/12    Incidental finding of LE venous dopplers  . Cataract     right eye, hx of  . HTN (hypertension)     primary, Dr. Dorris Fetch  . Myocardial infarction 12/2011  . CHF (congestive heart failure)   . Stroke 10/01    RIght Leg weakness  . Cancer '90's    Colon  . Anemia     Current Outpatient Prescriptions  Medication Sig Dispense Refill  . aspirin EC 325 MG tablet Take 325 mg by mouth every morning.       . carvedilol (COREG) 25 MG tablet Take 25 mg by mouth 2 (two) times daily with a meal.      . cholecalciferol (VITAMIN D) 1000 UNITS tablet Take 1,000 Units by mouth every morning.       . furosemide (LASIX) 40 MG tablet Take 2 tablets (80 mg total) by mouth daily.  30 tablet  9  . hydrALAZINE (APRESOLINE)  25 MG tablet Take 25 mg by mouth 3 (three) times daily.      . isosorbide mononitrate (IMDUR) 60 MG 24 hr tablet Take 1 tablet (60 mg total) by mouth daily.  30 tablet  0  . Multiple Vitamin (MULTIVITAMIN) capsule Take 1 capsule by mouth every morning.       . nitroGLYCERIN (NITROSTAT) 0.4 MG SL tablet Place 1 tablet (0.4 mg total) under the tongue every 5 (five) minutes x 3 doses as needed. For chest pain  25 tablet  5  . omeprazole (PRILOSEC) 20 MG capsule Take 20 mg by mouth every morning.       . potassium chloride SA (K-DUR,KLOR-CON) 20 MEQ tablet Take 40 mEq by mouth daily.      . Prenatal Vit-Fe Fumarate-FA (MULTIVITAMIN-PRENATAL) 27-0.8 MG TABS Take 1 tablet by mouth every evening.      .  Tamsulosin HCl (FLOMAX) 0.4 MG CAPS Take 1 capsule (0.4 mg total) by mouth daily after supper.  30 capsule  3  . traMADol (ULTRAM) 50 MG tablet Take 0.5 tablets (25 mg total) by mouth every 6 (six) hours as needed for pain.  30 tablet  0  . Travoprost, BAK Free, (TRAVATAN) 0.004 % SOLN ophthalmic solution Place 1 drop into both eyes 2 (two) times daily.      . [DISCONTINUED] metFORMIN (GLUCOPHAGE) 500 MG tablet Take 500-1,000 mg by mouth. 2 tabs in the am, 1 tab in the pm       No current facility-administered medications for this encounter.     Allergies  Allergen Reactions  . Statins     Pain/weakness in legs    History   Social History  . Marital Status: Married    Spouse Name: N/A    Number of Children: N/A  . Years of Education: N/A   Occupational History  . Not on file.   Social History Main Topics  . Smoking status: Never Smoker   . Smokeless tobacco: Never Used  . Alcohol Use: No  . Drug Use: No  . Sexually Active: Not Currently   Other Topics Concern  . Not on file   Social History Narrative  . No narrative on file    Family History  Problem Relation Age of Onset  . Cancer Mother   . Heart attack Father   . Mental illness Brother   . Kidney failure Brother     PHYSICAL EXAM: Filed Vitals:   05/28/13 0912  BP: 122/46  Pulse: 60   General:  Elderly chronically ill appearing. No respiratory difficulty Wife  present HEENT: normal Neck: supple. JVD 5-6. Carotids 2+ bilat; no bruits. No lymphadenopathy or thryomegaly appreciated. Cor: PMI nondisplaced. Regular rate & rhythm. No rubs, gallops or murmurs. Lungs: clear Abdomen: soft, nontender, nondistended. No hepatosplenomegaly. No bruits or masses. Good bowel sounds. Extremities: no cyanosis, clubbing, rash, edema Neuro: alert & oriented x 3, cranial nerves grossly intact. moves all 4 extremities w/o difficulty. Affect pleasant.    Results for orders placed during the hospital encounter of 05/28/13  (from the past 24 hour(s))  BASIC METABOLIC PANEL     Status: Abnormal   Collection Time    05/28/13  9:44 AM      Result Value Range   Sodium 132 (*) 135 - 145 mEq/L   Potassium 4.3  3.5 - 5.1 mEq/L   Chloride 88 (*) 96 - 112 mEq/L   CO2 35 (*) 19 - 32 mEq/L   Glucose, Bld 324 (*)  70 - 99 mg/dL   BUN 56 (*) 6 - 23 mg/dL   Creatinine, Ser 1.61 (*) 0.50 - 1.35 mg/dL   Calcium 9.3  8.4 - 09.6 mg/dL   GFR calc non Af Amer 23 (*) >90 mL/min   GFR calc Af Amer 27 (*) >90 mL/min   No results found.   ASSESSMENT & PLAN:

## 2013-05-29 ENCOUNTER — Other Ambulatory Visit: Payer: Medicare Other

## 2013-05-29 DIAGNOSIS — I5022 Chronic systolic (congestive) heart failure: Secondary | ICD-10-CM | POA: Diagnosis not present

## 2013-05-29 DIAGNOSIS — I509 Heart failure, unspecified: Secondary | ICD-10-CM | POA: Diagnosis not present

## 2013-05-29 DIAGNOSIS — N189 Chronic kidney disease, unspecified: Secondary | ICD-10-CM | POA: Diagnosis not present

## 2013-05-29 DIAGNOSIS — Z8701 Personal history of pneumonia (recurrent): Secondary | ICD-10-CM | POA: Diagnosis not present

## 2013-05-29 DIAGNOSIS — I2510 Atherosclerotic heart disease of native coronary artery without angina pectoris: Secondary | ICD-10-CM | POA: Diagnosis not present

## 2013-05-30 ENCOUNTER — Other Ambulatory Visit: Payer: Self-pay | Admitting: Oncology

## 2013-06-02 ENCOUNTER — Ambulatory Visit: Payer: Medicare Other | Admitting: Internal Medicine

## 2013-06-02 DIAGNOSIS — I5022 Chronic systolic (congestive) heart failure: Secondary | ICD-10-CM | POA: Diagnosis not present

## 2013-06-02 DIAGNOSIS — Z8701 Personal history of pneumonia (recurrent): Secondary | ICD-10-CM | POA: Diagnosis not present

## 2013-06-02 DIAGNOSIS — I2510 Atherosclerotic heart disease of native coronary artery without angina pectoris: Secondary | ICD-10-CM | POA: Diagnosis not present

## 2013-06-02 DIAGNOSIS — N189 Chronic kidney disease, unspecified: Secondary | ICD-10-CM | POA: Diagnosis not present

## 2013-06-02 DIAGNOSIS — I509 Heart failure, unspecified: Secondary | ICD-10-CM | POA: Diagnosis not present

## 2013-06-04 ENCOUNTER — Ambulatory Visit (HOSPITAL_BASED_OUTPATIENT_CLINIC_OR_DEPARTMENT_OTHER): Payer: Medicare Other

## 2013-06-04 ENCOUNTER — Other Ambulatory Visit (HOSPITAL_BASED_OUTPATIENT_CLINIC_OR_DEPARTMENT_OTHER): Payer: Medicare Other | Admitting: Lab

## 2013-06-04 VITALS — BP 138/56 | HR 58 | Temp 97.5°F

## 2013-06-04 DIAGNOSIS — N189 Chronic kidney disease, unspecified: Secondary | ICD-10-CM

## 2013-06-04 DIAGNOSIS — I2510 Atherosclerotic heart disease of native coronary artery without angina pectoris: Secondary | ICD-10-CM | POA: Diagnosis not present

## 2013-06-04 DIAGNOSIS — I509 Heart failure, unspecified: Secondary | ICD-10-CM | POA: Diagnosis not present

## 2013-06-04 DIAGNOSIS — D649 Anemia, unspecified: Secondary | ICD-10-CM

## 2013-06-04 DIAGNOSIS — Z8701 Personal history of pneumonia (recurrent): Secondary | ICD-10-CM | POA: Diagnosis not present

## 2013-06-04 DIAGNOSIS — I5022 Chronic systolic (congestive) heart failure: Secondary | ICD-10-CM | POA: Diagnosis not present

## 2013-06-04 LAB — CBC WITH DIFFERENTIAL/PLATELET
BASO%: 0.4 % (ref 0.0–2.0)
EOS%: 1.3 % (ref 0.0–7.0)
HCT: 27.7 % — ABNORMAL LOW (ref 38.4–49.9)
LYMPH%: 28.8 % (ref 14.0–49.0)
MCH: 30.8 pg (ref 27.2–33.4)
MCHC: 33.3 g/dL (ref 32.0–36.0)
MCV: 92.5 fL (ref 79.3–98.0)
NEUT%: 61.5 % (ref 39.0–75.0)
Platelets: 185 10*3/uL (ref 140–400)

## 2013-06-04 MED ORDER — DARBEPOETIN ALFA-POLYSORBATE 300 MCG/0.6ML IJ SOLN
300.0000 ug | Freq: Once | INTRAMUSCULAR | Status: AC
  Administered 2013-06-04: 300 ug via SUBCUTANEOUS
  Filled 2013-06-04: qty 0.6

## 2013-06-04 NOTE — Patient Instructions (Addendum)
Darbepoetin Alfa injection What is this medicine? DARBEPOETIN ALFA (dar be POE e tin AL fa) helps your body make more red blood cells. It is used to treat anemia caused by chronic kidney failure and chemotherapy. This medicine may be used for other purposes; ask your health care provider or pharmacist if you have questions. What should I tell my health care provider before I take this medicine? They need to know if you have any of these conditions: -blood clotting disorders or history of blood clots -cancer patient not on chemotherapy -cystic fibrosis -heart disease, such as angina, heart failure, or a history of a heart attack -hemoglobin level of 12 g/dL or greater -high blood pressure -low levels of folate, iron, or vitamin B12 -seizures -an unusual or allergic reaction to darbepoetin, erythropoietin, albumin, hamster proteins, latex, other medicines, foods, dyes, or preservatives -pregnant or trying to get pregnant -breast-feeding How should I use this medicine? This medicine is for injection into a vein or under the skin. It is usually given by a health care professional in a hospital or clinic setting. If you get this medicine at home, you will be taught how to prepare and give this medicine. Do not shake the solution before you withdraw a dose. Use exactly as directed. Take your medicine at regular intervals. Do not take your medicine more often than directed. It is important that you put your used needles and syringes in a special sharps container. Do not put them in a trash can. If you do not have a sharps container, call your pharmacist or healthcare provider to get one. Talk to your pediatrician regarding the use of this medicine in children. While this medicine may be used in children as young as 1 year for selected conditions, precautions do apply. Overdosage: If you think you have taken too much of this medicine contact a poison control center or emergency room at once. NOTE:  This medicine is only for you. Do not share this medicine with others. What if I miss a dose? If you miss a dose, take it as soon as you can. If it is almost time for your next dose, take only that dose. Do not take double or extra doses. What may interact with this medicine? Do not take this medicine with any of the following medications: -epoetin alfa This list may not describe all possible interactions. Give your health care provider a list of all the medicines, herbs, non-prescription drugs, or dietary supplements you use. Also tell them if you smoke, drink alcohol, or use illegal drugs. Some items may interact with your medicine. What should I watch for while using this medicine? Visit your prescriber or health care professional for regular checks on your progress and for the needed blood tests and blood pressure measurements. It is especially important for the doctor to make sure your hemoglobin level is in the desired range, to limit the risk of potential side effects and to give you the best benefit. Keep all appointments for any recommended tests. Check your blood pressure as directed. Ask your doctor what your blood pressure should be and when you should contact him or her. As your body makes more red blood cells, you may need to take iron, folic acid, or vitamin B supplements. Ask your doctor or health care provider which products are right for you. If you have kidney disease continue dietary restrictions, even though this medication can make you feel better. Talk with your doctor or health care professional about the   foods you eat and the vitamins that you take. What side effects may I notice from receiving this medicine? Side effects that you should report to your doctor or health care professional as soon as possible: -allergic reactions like skin rash, itching or hives, swelling of the face, lips, or tongue -breathing problems -changes in vision -chest pain -confusion, trouble speaking  or understanding -feeling faint or lightheaded, falls -high blood pressure -muscle aches or pains -pain, swelling, warmth in the leg -rapid weight gain -severe headaches -sudden numbness or weakness of the face, arm or leg -trouble walking, dizziness, loss of balance or coordination -seizures (convulsions) -swelling of the ankles, feet, hands -unusually weak or tired Side effects that usually do not require medical attention (report to your doctor or health care professional if they continue or are bothersome): -diarrhea -fever, chills (flu-like symptoms) -headaches -nausea, vomiting -redness, stinging, or swelling at site where injected This list may not describe all possible side effects. Call your doctor for medical advice about side effects. You may report side effects to FDA at 1-800-FDA-1088. Where should I keep my medicine? Keep out of the reach of children. Store in a refrigerator between 2 and 8 degrees C (36 and 46 degrees F). Do not freeze. Do not shake. Throw away any unused portion if using a single-dose vial. Throw away any unused medicine after the expiration date. NOTE: This sheet is a summary. It may not cover all possible information. If you have questions about this medicine, talk to your doctor, pharmacist, or health care provider.  2013, [employer]. (11/10/2008 10:23:57 AM)  

## 2013-06-05 ENCOUNTER — Encounter: Payer: Self-pay | Admitting: Internal Medicine

## 2013-06-05 DIAGNOSIS — I509 Heart failure, unspecified: Secondary | ICD-10-CM | POA: Diagnosis not present

## 2013-06-05 DIAGNOSIS — Z8701 Personal history of pneumonia (recurrent): Secondary | ICD-10-CM | POA: Diagnosis not present

## 2013-06-05 DIAGNOSIS — I5022 Chronic systolic (congestive) heart failure: Secondary | ICD-10-CM | POA: Diagnosis not present

## 2013-06-05 DIAGNOSIS — I2510 Atherosclerotic heart disease of native coronary artery without angina pectoris: Secondary | ICD-10-CM | POA: Diagnosis not present

## 2013-06-05 DIAGNOSIS — N189 Chronic kidney disease, unspecified: Secondary | ICD-10-CM | POA: Diagnosis not present

## 2013-06-09 DIAGNOSIS — Z8701 Personal history of pneumonia (recurrent): Secondary | ICD-10-CM | POA: Diagnosis not present

## 2013-06-09 DIAGNOSIS — N189 Chronic kidney disease, unspecified: Secondary | ICD-10-CM | POA: Diagnosis not present

## 2013-06-09 DIAGNOSIS — I5022 Chronic systolic (congestive) heart failure: Secondary | ICD-10-CM | POA: Diagnosis not present

## 2013-06-09 DIAGNOSIS — I509 Heart failure, unspecified: Secondary | ICD-10-CM | POA: Diagnosis not present

## 2013-06-09 DIAGNOSIS — I2510 Atherosclerotic heart disease of native coronary artery without angina pectoris: Secondary | ICD-10-CM | POA: Diagnosis not present

## 2013-06-11 DIAGNOSIS — I509 Heart failure, unspecified: Secondary | ICD-10-CM | POA: Diagnosis not present

## 2013-06-11 DIAGNOSIS — I2510 Atherosclerotic heart disease of native coronary artery without angina pectoris: Secondary | ICD-10-CM | POA: Diagnosis not present

## 2013-06-11 DIAGNOSIS — Z8701 Personal history of pneumonia (recurrent): Secondary | ICD-10-CM | POA: Diagnosis not present

## 2013-06-11 DIAGNOSIS — I5022 Chronic systolic (congestive) heart failure: Secondary | ICD-10-CM | POA: Diagnosis not present

## 2013-06-11 DIAGNOSIS — N189 Chronic kidney disease, unspecified: Secondary | ICD-10-CM | POA: Diagnosis not present

## 2013-06-16 ENCOUNTER — Encounter (HOSPITAL_COMMUNITY): Payer: Self-pay

## 2013-06-16 ENCOUNTER — Telehealth (HOSPITAL_COMMUNITY): Payer: Self-pay | Admitting: Adult Health

## 2013-06-16 ENCOUNTER — Ambulatory Visit (HOSPITAL_COMMUNITY)
Admission: RE | Admit: 2013-06-16 | Discharge: 2013-06-16 | Disposition: A | Discharge status: Home / Self Care | Payer: Medicare Other | Source: Ambulatory Visit | Attending: Internal Medicine | Admitting: Internal Medicine

## 2013-06-16 VITALS — BP 130/50 | HR 58 | Wt 136.8 lb

## 2013-06-16 DIAGNOSIS — I5022 Chronic systolic (congestive) heart failure: Secondary | ICD-10-CM | POA: Insufficient documentation

## 2013-06-16 LAB — BASIC METABOLIC PANEL
CO2: 27 mEq/L (ref 19–32)
Calcium: 9.2 mg/dL (ref 8.4–10.5)
Creatinine, Ser: 2.02 mg/dL — ABNORMAL HIGH (ref 0.50–1.35)
GFR calc Af Amer: 33 mL/min — ABNORMAL LOW (ref >90)

## 2013-06-16 NOTE — Progress Notes (Signed)
Patient ID: [name] [name], male   DOB: May 17, 1926, 79 y.o.   MRN: [id number] PCP: Bufford Spikes  HPI: [name] is a 79 y.o. male with a PMHx of CAD, CABG, AVR, anemia, CKD last creatinine 2.2, Chronic systolic heart failure EF 40%  03/2013 and sepis E-Coli. He is not on an ACE or ARB due renal failure.   Admitted to [facility] 4/28 through 04/15/13  with volume overload. Diuresed with IV lasix. He was not discharged on diuretics. Discharge weight 139 pounds.   05/28/13 Creatinine 2.37 Potassium 4.3  He returns for follow up with his wife. Last office visit he was instructed to take 80 mg of lasix daily and hold lasix if his weight is < 131 pound and take an additional 40 mg of lasix if his weight is 135 or greater. Overall he is feeling much better. Over the last 3 weeks he has required extra 40 mg of lasix on 2 occasions for a weight of 135 pounds. He has held lasix on one occasion for weight < 131 pounds. Weight at home has been 129-135 pounds.  Denies SOB/PND/Orthopnea. His wife manages his medications.       Past Medical History  Diagnosis Date  . History of colon cancer     s/p colon resection  . Aortic stenosis     a. s/p AVR with 23mm Edwards pericardial valve 02/07/12 - post-op course complicated by pleural effusion requring thoracentesis/leg cellulitis 03/2012.  Marland Kitchen CAD (coronary artery disease)     a. s/p NSTEMI 12/2011:  LHC - Ostial left main 20%, ostial LAD 50%, mid 60-70%, ostial D1 40% and mid 40%, D2 70%, ostial circumflex occluded, ostial RCA 80-90%, LVEDP was 42. b.  s/p CABG x 3 at time of AVR (LiMA-LAD, SVG-2nd daigonal, SVG-PDA) 02/07/12 (post-op course noted above).  . Ischemic cardiomyopathy   . Chronic systolic heart failure     a. TEE 01/2012: EF 25-30%, diffuse hypokinesis. b. Not on ACEI due to renal insufficiency.;  c. follow up  echo 08/06/12: EF 25%, mod diast dysfxn, AVR ok, mild MR, mod LAE, mild RAE, mild to mod RV systolic dysfunction  . HLD (hyperlipidemia)   . GERD  (gastroesophageal reflux disease)   . Chronic kidney disease   . Pleural effusion     a. post-operatively after AVR/CABG s/p thoracentesis 03/2012 yielding 1L serosanguinous fluid.  . Diabetes mellitus     borderline  . Blood transfusion     NO REACTION TO TRANSFUSION  . Cellulitis     a. RLE cellulitis 2 months post-operatively after AVR/CABG - serratia marcessans, tx with I&D/antibiotics  . Mitral regurgitation     Moderate by TEE 01/2012  . Flash pulmonary edema     Post-cath 12/2011, went into acute pulm edema requiring IV lasix and intubation  . Baker's cyst 07/23/12    Incidental finding of LE venous dopplers  . Cataract     right eye, hx of  . HTN (hypertension)     primary, Dr. Dorris Fetch  . Myocardial infarction 12/2011  . CHF (congestive heart failure)   . Stroke 10/01    RIght Leg weakness  . Cancer '90's    Colon  . Anemia     Current Outpatient Prescriptions  Medication Sig Dispense Refill  . aspirin EC 325 MG tablet Take 325 mg by mouth every morning.       . carvedilol (COREG) 25 MG tablet Take 25 mg by mouth 2 (two) times daily with a meal.      .  cholecalciferol (VITAMIN D) 1000 UNITS tablet Take 1,000 Units by mouth every morning.       . furosemide (LASIX) 40 MG tablet Take 2 tablets (80 mg total) by mouth daily.  30 tablet  9  . hydrALAZINE (APRESOLINE) 25 MG tablet Take 25 mg by mouth 3 (three) times daily.      . isosorbide mononitrate (IMDUR) 60 MG 24 hr tablet Take 1 tablet (60 mg total) by mouth daily.  30 tablet  0  . Multiple Vitamin (MULTIVITAMIN) capsule Take 1 capsule by mouth every morning.       . nitroGLYCERIN (NITROSTAT) 0.4 MG SL tablet Place 1 tablet (0.4 mg total) under the tongue every 5 (five) minutes x 3 doses as needed. For chest pain  25 tablet  5  . omeprazole (PRILOSEC) 20 MG capsule Take 20 mg by mouth every morning.       . potassium chloride SA (K-DUR,KLOR-CON) 20 MEQ tablet Take 40 mEq by mouth daily.      . Prenatal Vit-Fe  Fumarate-FA (MULTIVITAMIN-PRENATAL) 27-0.8 MG TABS Take 1 tablet by mouth every evening.      . tamsulosin (FLOMAX) 0.4 MG CAPS TAKE 1 CAPSULE (0.4 MG TOTAL) BY MOUTH DAILY AFTER SUPPER.  30 capsule  3  . traMADol (ULTRAM) 50 MG tablet Take 0.5 tablets (25 mg total) by mouth every 6 (six) hours as needed for pain.  30 tablet  0  . Travoprost, BAK Free, (TRAVATAN) 0.004 % SOLN ophthalmic solution Place 1 drop into both eyes 2 (two) times daily.      . [DISCONTINUED] metFORMIN (GLUCOPHAGE) 500 MG tablet Take 500-1,000 mg by mouth. 2 tabs in the am, 1 tab in the pm       No current facility-administered medications for this encounter.     Allergies  Allergen Reactions  . Statins     Pain/weakness in legs    History   Social History  . Marital Status: Married    Spouse Name: N/A    Number of Children: N/A  . Years of Education: N/A   Occupational History  . Not on file.   Social History Main Topics  . Smoking status: Never Smoker   . Smokeless tobacco: Never Used  . Alcohol Use: No  . Drug Use: No  . Sexually Active: Not Currently   Other Topics Concern  . Not on file   Social History Narrative  . No narrative on file    Family History  Problem Relation Age of Onset  . Cancer Mother   . Heart attack Father   . Mental illness Brother   . Kidney failure Brother     PHYSICAL EXAM: Filed Vitals:   06/16/13 1316  BP: 130/50  Pulse: 58   General:  Elderly chronically ill appearing. No respiratory difficulty Wife  present HEENT: normal Neck: supple. JVD 5-6. Carotids 2+ bilat; no bruits. No lymphadenopathy or thryomegaly appreciated. Cor: PMI nondisplaced. Regular rate & rhythm. No rubs, gallops or murmurs. Lungs: clear Abdomen: soft, nontender, nondistended. No hepatosplenomegaly. No bruits or masses. Good bowel sounds. Extremities: no cyanosis, clubbing, rash, edema Neuro: alert & oriented x 3, cranial nerves grossly intact. moves all 4 extremities w/o difficulty.  Affect pleasant.    No results found for this or any previous visit (from the past 24 hour(s)). No results found.   ASSESSMENT & PLAN:

## 2013-06-16 NOTE — Assessment & Plan Note (Addendum)
Volume status stable. Continue current diuretic regimen. He will continue to hold lasix if his weight is < 131 pounds and take an additional 40 mg of lasix if his weight is 135 pounds or greater. Baseline weight is 131-134 pounds. O goal dose for beta blocker. He is not on Ace due to renal failure. Continue current dose for hydralazine/IMDUR. Check BMET today. Follow up in 2 months with Dr Gala Romney.

## 2013-06-16 NOTE — Addendum Note (Signed)
Addended by: Noralee Space on: 06/16/2013 02:40 PM   Modules accepted: Orders

## 2013-06-16 NOTE — Patient Instructions (Addendum)
Follow up in 2 months  Do the following things EVERYDAY: 1) Weigh yourself in the morning before breakfast. Write it down and keep it in a log. 2) Take your medicines as prescribed 3) Eat low salt foods-Limit salt (sodium) to 2000 mg per day.  4) Stay as active as you can everyday 5) Limit all fluids for the day to less than 2 liters 

## 2013-06-16 NOTE — Telephone Encounter (Signed)
Provided with lab results   Creatinine 2.02 Potassium 6.0   Instructed to sop potassium. Repeat BMET 06/18/13 at [facility].    Mr and Mrs Meacham verbalized understanding.   CLEGG,AMY 2:29 PM

## 2013-06-17 DIAGNOSIS — I5022 Chronic systolic (congestive) heart failure: Secondary | ICD-10-CM | POA: Diagnosis not present

## 2013-06-17 DIAGNOSIS — I2510 Atherosclerotic heart disease of native coronary artery without angina pectoris: Secondary | ICD-10-CM | POA: Diagnosis not present

## 2013-06-17 DIAGNOSIS — Z8701 Personal history of pneumonia (recurrent): Secondary | ICD-10-CM | POA: Diagnosis not present

## 2013-06-17 DIAGNOSIS — N189 Chronic kidney disease, unspecified: Secondary | ICD-10-CM | POA: Diagnosis not present

## 2013-06-17 DIAGNOSIS — I509 Heart failure, unspecified: Secondary | ICD-10-CM | POA: Diagnosis not present

## 2013-06-19 ENCOUNTER — Other Ambulatory Visit (INDEPENDENT_AMBULATORY_CARE_PROVIDER_SITE_OTHER): Payer: Medicare Other

## 2013-06-19 ENCOUNTER — Telehealth (HOSPITAL_COMMUNITY): Payer: Self-pay | Admitting: Adult Health

## 2013-06-19 DIAGNOSIS — I5022 Chronic systolic (congestive) heart failure: Secondary | ICD-10-CM | POA: Diagnosis not present

## 2013-06-19 LAB — BASIC METABOLIC PANEL
Calcium: 8.9 mg/dL (ref 8.4–10.5)
Creatinine, Ser: 2.3 mg/dL — ABNORMAL HIGH (ref 0.4–1.5)
GFR: 28.27 mL/min — ABNORMAL LOW (ref >60.00)
Glucose, Bld: 251 mg/dL — ABNORMAL HIGH (ref 70–99)
Sodium: 133 mEq/L — ABNORMAL LOW (ref 135–145)

## 2013-06-19 NOTE — Telephone Encounter (Signed)
Provided lab results. Potassium improved. 3.7  [name] [name] verbalized understanding.   [name] [name] 4:48 PM

## 2013-06-25 ENCOUNTER — Other Ambulatory Visit (HOSPITAL_BASED_OUTPATIENT_CLINIC_OR_DEPARTMENT_OTHER): Payer: Medicare Other | Admitting: Lab

## 2013-06-25 ENCOUNTER — Encounter: Payer: Self-pay | Admitting: Oncology

## 2013-06-25 ENCOUNTER — Telehealth: Payer: Self-pay | Admitting: Oncology

## 2013-06-25 ENCOUNTER — Ambulatory Visit (HOSPITAL_BASED_OUTPATIENT_CLINIC_OR_DEPARTMENT_OTHER): Payer: Medicare Other

## 2013-06-25 ENCOUNTER — Ambulatory Visit (HOSPITAL_BASED_OUTPATIENT_CLINIC_OR_DEPARTMENT_OTHER): Payer: Medicare Other | Admitting: Oncology

## 2013-06-25 VITALS — BP 126/54 | HR 49 | Temp 97.0°F | Resp 20 | Ht 68.0 in | Wt 138.5 lb

## 2013-06-25 VITALS — BP 122/49 | HR 49 | Temp 97.4°F

## 2013-06-25 DIAGNOSIS — D696 Thrombocytopenia, unspecified: Secondary | ICD-10-CM

## 2013-06-25 DIAGNOSIS — I10 Essential (primary) hypertension: Secondary | ICD-10-CM

## 2013-06-25 DIAGNOSIS — D649 Anemia, unspecified: Secondary | ICD-10-CM | POA: Diagnosis not present

## 2013-06-25 DIAGNOSIS — N179 Acute kidney failure, unspecified: Secondary | ICD-10-CM

## 2013-06-25 LAB — CBC WITH DIFFERENTIAL/PLATELET
BASO%: 0.4 % (ref 0.0–2.0)
Basophils Absolute: 0 10*3/uL (ref 0.0–0.1)
EOS%: 1.2 % (ref 0.0–7.0)
HGB: 9.1 g/dL — ABNORMAL LOW (ref 13.0–17.1)
MCH: 30.4 pg (ref 27.2–33.4)
RBC: 2.99 10*6/uL — ABNORMAL LOW (ref 4.20–5.82)
RDW: 15.8 % — ABNORMAL HIGH (ref 11.0–14.6)
lymph#: 1.9 10*3/uL (ref 0.9–3.3)

## 2013-06-25 MED ORDER — DARBEPOETIN ALFA-POLYSORBATE 300 MCG/0.6ML IJ SOLN
300.0000 ug | Freq: Once | INTRAMUSCULAR | Status: AC
  Administered 2013-06-25: 300 ug via SUBCUTANEOUS
  Filled 2013-06-25: qty 0.6

## 2013-06-25 NOTE — Telephone Encounter (Signed)
gv and printed appt sched and avs for pt  °

## 2013-06-25 NOTE — Progress Notes (Signed)
Hematology and Oncology Follow Up Visit  [name] [name] [id number] May 27, 1926 79 y.o. 06/25/2013 3:30 PM [name], [name], DOReed, [name] L, DO   Principle Diagnosis: anemia that is multifactorial likely due to chronic disease and renal insufficiency  Current therapy: Aranesp 300 mcg every 3 weeks for a hemoglobin less than 10  Interim History:  [name] [name] returns for routine followup with his wife. He is receiving Aranesp and tolerating well. He has noticed little improvement in terms of his fatigue. He denies chest pain, shortness of breath, dyspnea. He has not noticed any bleeding. Appetite is improving and weight is stable. Lower extremity edema has improved. He is being followed by the CHF clinic. No bleeding. His quality of life is improving at this time.   Medications: I have reviewed the patient's current medications. Current outpatient prescriptions:aspirin EC 325 MG tablet, Take 325 mg by mouth every morning. , Disp: , Rfl: ;  carvedilol (COREG) 25 MG tablet, Take 25 mg by mouth 2 (two) times daily with a meal., Disp: , Rfl: ;  cholecalciferol (VITAMIN D) 1000 UNITS tablet, Take 1,000 Units by mouth every morning. , Disp: , Rfl: ;  furosemide (LASIX) 40 MG tablet, Take 2 tablets (80 mg total) by mouth daily., Disp: 30 tablet, Rfl: 9 hydrALAZINE (APRESOLINE) 25 MG tablet, Take 25 mg by mouth 3 (three) times daily., Disp: , Rfl: ;  isosorbide mononitrate (IMDUR) 60 MG 24 hr tablet, Take 1 tablet (60 mg total) by mouth daily., Disp: 30 tablet, Rfl: 0;  Multiple Vitamin (MULTIVITAMIN) capsule, Take 1 capsule by mouth every morning. , Disp: , Rfl:  nitroGLYCERIN (NITROSTAT) 0.4 MG SL tablet, Place 1 tablet (0.4 mg total) under the tongue every 5 (five) minutes x 3 doses as needed. For chest pain, Disp: 25 tablet, Rfl: 5;  omeprazole (PRILOSEC) 20 MG capsule, Take 20 mg by mouth every morning. , Disp: , Rfl: ;  Prenatal Vit-Fe Fumarate-FA (MULTIVITAMIN-PRENATAL) 27-0.8 MG TABS, Take 1 tablet by  mouth every evening., Disp: , Rfl:  tamsulosin (FLOMAX) 0.4 MG CAPS, TAKE 1 CAPSULE (0.4 MG TOTAL) BY MOUTH DAILY AFTER SUPPER., Disp: 30 capsule, Rfl: 3;  traMADol (ULTRAM) 50 MG tablet, Take 0.5 tablets (25 mg total) by mouth every 6 (six) hours as needed for pain., Disp: 30 tablet, Rfl: 0;  Travoprost, BAK Free, (TRAVATAN) 0.004 % SOLN ophthalmic solution, Place 1 drop into both eyes 2 (two) times daily., Disp: , Rfl:  [DISCONTINUED] metFORMIN (GLUCOPHAGE) 500 MG tablet, Take 500-1,000 mg by mouth. 2 tabs in the am, 1 tab in the pm, Disp: , Rfl:   Allergies:  Allergies  Allergen Reactions  . Statins     Pain/weakness in legs    Past Medical History, Surgical history, Social history, and Family History were reviewed and updated.  Review of Systems: Constitutional:  Negative for fever, chills, night sweats, anorexia, weight loss, pain. Cardiovascular: no chest pain or dyspnea on exertion Respiratory: no cough, shortness of breath, or wheezing Neurological: no TIA or stroke symptoms Dermatological: negative ENT: negative Skin: Negative. Gastrointestinal: no abdominal pain, change in bowel habits, or black or bloody stools Genito-Urinary: no dysuria, trouble voiding, or hematuria Hematological and Lymphatic: negative Breast: negative for breast lumps Musculoskeletal: negative Remaining ROS negative.  Physical Exam: Blood pressure 126/54, pulse 49, temperature 97 F (36.1 C), temperature source Oral, resp. rate 20, height 5\' 8"  (1.727 m), weight 138 lb 8 oz (62.823 kg). ECOG: 1 General appearance: alert, cooperative and no distress Head: Normocephalic, without obvious abnormality,  atraumatic Neck: no adenopathy, no carotid bruit, no JVD, supple, symmetrical, trachea midline and thyroid not enlarged, symmetric, no tenderness/mass/nodules Lymph nodes: Cervical, supraclavicular, and axillary nodes normal. Heart:regular rate and rhythm, S1, S2 normal, no murmur, click, rub or  gallop Lung:chest clear, no wheezing, rales, normal symmetric air entry, no tachypnea, retractions or cyanosis Abdomen: soft, non-tender, without masses or organomegaly EXT:no erythema, induration, or nodules   Lab Results: Lab Results  Component Value Date   WBC 5.7 06/25/2013   HGB 9.1* 06/25/2013   HCT 28.1* 06/25/2013   MCV 94.1 06/25/2013   PLT 109* 06/25/2013     Chemistry      Component Value Date/Time   NA 133* 06/19/2013 0949   NA 146* 03/03/2013 0918   NA 140 09/04/2012 1309   K 3.7 06/19/2013 0949   K 4.7 09/04/2012 1309   CL 97 06/19/2013 0949   CL 101 09/04/2012 1309   CO2 37* 06/19/2013 0949   CO2 31* 09/04/2012 1309   BUN 44* 06/19/2013 0949   BUN 41* 03/03/2013 0918   BUN 41.0* 09/04/2012 1309   CREATININE 2.3* 06/19/2013 0949   CREATININE 2.21* 01/08/2013 1152   CREATININE 2.2* 09/04/2012 1309      Component Value Date/Time   CALCIUM 8.9 06/19/2013 0949   CALCIUM 9.3 09/04/2012 1309   ALKPHOS 100 04/12/2013 0430   ALKPHOS 73 09/04/2012 1309   AST 39* 04/12/2013 0430   AST 18 09/04/2012 1309   ALT 128* 04/12/2013 0430   ALT 21 09/04/2012 1309   BILITOT 0.7 04/12/2013 0430   BILITOT 0.40 09/04/2012 1309     Impression and Plan: This is an 79 year old gentleman with the following issues: 1. Anemia, multifactorial. Likely due to chronic disease and renal insufficiency. Hemoglobin is 9.1 today. Aranesp was given.  Recommend that he continue lab check every 3 weeks and Aranesp if Hgb <10.0. 2. Thrombocytopenia, mild. We will continue to watch this with future lab draws. He has no active bleeding. 3. Hypertension. Blood pressure is controlled with hydralazine, Coreg, and Lasix. 4. Coronary artery disease. He remains on aspirin, Coreg, and Imdur. 5. Followup. The patient will continue to have a lab and Aranesp injection every 3 weeks. Visit in approximately 12 to 16 weeks.     Clenton Pare 7/16/20143:30 PM

## 2013-07-04 ENCOUNTER — Encounter: Payer: Self-pay | Admitting: *Deleted

## 2013-07-07 ENCOUNTER — Encounter: Payer: Self-pay | Admitting: Internal Medicine

## 2013-07-07 ENCOUNTER — Ambulatory Visit (INDEPENDENT_AMBULATORY_CARE_PROVIDER_SITE_OTHER): Payer: Medicare Other | Admitting: Internal Medicine

## 2013-07-07 VITALS — BP 138/84 | HR 51 | Temp 97.4°F | Resp 14 | Ht 68.0 in | Wt 138.4 lb

## 2013-07-07 DIAGNOSIS — N183 Chronic kidney disease, stage 3 unspecified: Secondary | ICD-10-CM

## 2013-07-07 DIAGNOSIS — I509 Heart failure, unspecified: Secondary | ICD-10-CM | POA: Diagnosis not present

## 2013-07-07 DIAGNOSIS — I2510 Atherosclerotic heart disease of native coronary artery without angina pectoris: Secondary | ICD-10-CM | POA: Diagnosis not present

## 2013-07-07 DIAGNOSIS — D649 Anemia, unspecified: Secondary | ICD-10-CM

## 2013-07-07 DIAGNOSIS — I10 Essential (primary) hypertension: Secondary | ICD-10-CM

## 2013-07-07 DIAGNOSIS — Z8701 Personal history of pneumonia (recurrent): Secondary | ICD-10-CM | POA: Diagnosis not present

## 2013-07-07 DIAGNOSIS — E118 Type 2 diabetes mellitus with unspecified complications: Secondary | ICD-10-CM | POA: Diagnosis not present

## 2013-07-07 DIAGNOSIS — I5022 Chronic systolic (congestive) heart failure: Secondary | ICD-10-CM | POA: Diagnosis not present

## 2013-07-07 DIAGNOSIS — N189 Chronic kidney disease, unspecified: Secondary | ICD-10-CM | POA: Diagnosis not present

## 2013-07-07 NOTE — Progress Notes (Signed)
Patient ID: [name], male   DOB: 02/24/1926, 79 y.o.   MRN: [id number] Location:  [facility]  Code Status: DNR  Allergies  Allergen Reactions  . Statins     Pain/weakness in legs    Chief Complaint  Patient presents with  . Medical Managment of Chronic Issues    follow up from [facility]    HPI: Patient is a 79 y.o. white male seen in the office today.  He is always doing well when he comes here and has problems in between.    Last time had pneumonia with sepsis and had to go to [facility] afterwards.  Doing fine now.  Still getting rehab in right leg with [facility] which is weaker now than it was after his CABG Sugar and weights are stable. Went three weeks w/o lasix at [facility] and had to lasix resumed (b/c not on hospital d/c paperwork to [facility]) Went to advanced chf clinic at [facility] and straightened out now [facility] not coming anymore now.   He is here with his wife today  Review of Systems:  Review of Systems  Constitutional: Negative for fever and chills.  Eyes: Negative for blurred vision.  Respiratory: Negative for shortness of breath.   Cardiovascular: Negative for chest pain and leg swelling.  Gastrointestinal: Positive for abdominal pain. Negative for constipation, blood in stool and melena.       Early morning pain in back of hip--uses a cream that works from night till early am--goes away after up 30 mins  Genitourinary: Negative for dysuria.  Musculoskeletal: Negative for falls.  Skin: Negative for rash.  Neurological: Positive for focal weakness. Negative for headaches.       Right leg, ongoing  Endo/Heme/Allergies:       Gets procrit every 3 wks if hgb <10 at hematology  Psychiatric/Behavioral: Negative for depression and memory loss. The patient does not have insomnia.        Does get discouraged about his leg    Past Medical History  Diagnosis Date  . History of colon cancer      s/p colon resection  . Aortic stenosis     a. s/p AVR with 23mm Edwards pericardial valve 02/07/12 - post-op course complicated by pleural effusion requring thoracentesis/leg cellulitis 03/2012.  Marland Kitchen CAD (coronary artery disease)     a. s/p NSTEMI 12/2011:  LHC - Ostial left main 20%, ostial LAD 50%, mid 60-70%, ostial D1 40% and mid 40%, D2 70%, ostial circumflex occluded, ostial RCA 80-90%, LVEDP was 42. b.  s/p CABG x 3 at time of AVR (LiMA-LAD, SVG-2nd daigonal, SVG-PDA) 02/07/12 (post-op course noted above).  . Ischemic cardiomyopathy   . Chronic systolic heart failure     a. TEE 01/2012: EF 25-30%, diffuse hypokinesis. b. Not on ACEI due to renal insufficiency.;  c. follow up  echo 08/06/12: EF 25%, mod diast dysfxn, AVR ok, mild MR, mod LAE, mild RAE, mild to mod RV systolic dysfunction  . HLD (hyperlipidemia)   . GERD (gastroesophageal reflux disease)   . Chronic kidney disease   . Pleural effusion     a. post-operatively after AVR/CABG s/p thoracentesis 03/2012 yielding 1L serosanguinous fluid.  . Diabetes mellitus     borderline  . Blood transfusion     NO REACTION TO TRANSFUSION  . Cellulitis     a. RLE cellulitis 2 months post-operatively after AVR/CABG - serratia marcessans, tx with I&D/antibiotics  . Mitral regurgitation  Moderate by TEE 01/2012  . Flash pulmonary edema     Post-cath 12/2011, went into acute pulm edema requiring IV lasix and intubation  . Baker's cyst 07/23/12    Incidental finding of LE venous dopplers  . Cataract     right eye, hx of  . HTN (hypertension)     primary, Dr. Dorris Fetch  . Myocardial infarction 12/2011  . CHF (congestive heart failure)   . Stroke 10/01    RIght Leg weakness  . Cancer '90's    Colon  . Anemia     Past Surgical History  Procedure Laterality Date  . Colon resection  1996  . Esophageal dilation    . Colonoscopy    . Cataract extraction      rt  . Cardiac catheterization      1.3.13  stopped breathing, put on  ventilator for 5-6 days  . Tonsillectomy    . Aortic valve replacement  02/07/2012    Procedure: AORTIC VALVE REPLACEMENT (AVR);  Surgeon: [name], MD;  Location: [facility] OR;  Service: Open Heart Surgery;  Laterality: N/A;  . Coronary artery bypass graft  02/07/2012    Procedure: CORONARY ARTERY BYPASS GRAFTING (CABG);  Surgeon: [name], MD;  Location: [facility] OR;  Service: Open Heart Surgery;  Laterality: N/A;  CABG x three; using right leg greater saphenous vein harvested endoscopically  . Chest tube insertion  07/24/2012    Procedure: INSERTION PLEURAL DRAINAGE CATHETER;  Surgeon: [name], MD;  Location: [facility] OR;  Service: Thoracic;  Laterality: Left;  . Talc pleurodesis  08/30/2012    Procedure: TALC PLEURADESIS;  Surgeon: [name], MD;  Location: [facility] OR;  Service: Thoracic;  Laterality: Left;  INSERTION OF TALC VIA LEFT PLEURX  . Talc pleurodesis  09/12/2012    Procedure: TALC PLEURADESIS;  Surgeon: [name], MD;  Location: [facility] OR;  Service: Thoracic;  Laterality: Left;  . Removal of pleural drainage catheter  09/27/2012    Procedure: REMOVAL OF PLEURAL DRAINAGE CATHETER;  Surgeon: [name], MD;  Location: [facility] OR;  Service: Thoracic;  Laterality: Left;  Marland Kitchen Eye surgery  2009    cataract removed from right eye    Social History:   reports that he has never smoked. He has never used smokeless tobacco. He reports that he does not drink alcohol or use illicit drugs.  Family History  Problem Relation Age of Onset  . Cancer Mother   . Heart attack Father   . Mental illness Brother   . Kidney failure Brother     Medications: Patient's Medications  New Prescriptions   No medications on file  Previous Medications   ASPIRIN EC 325 MG TABLET    Take 325 mg by mouth every morning.    CARVEDILOL (COREG) 25 MG TABLET    Take 25 mg by mouth 2 (two) times daily with a meal.   CHOLECALCIFEROL (VITAMIN D) 1000 UNITS TABLET    Take 1,000 Units by mouth every morning.     FUROSEMIDE (LASIX) 40 MG TABLET    Take 2 tablets (80 mg total) by mouth daily.   HYDRALAZINE (APRESOLINE) 25 MG TABLET    Take 25 mg by mouth 3 (three) times daily.   ISOSORBIDE MONONITRATE (IMDUR) 60 MG 24 HR TABLET    Take 1 tablet (60 mg total) by mouth daily.   MULTIPLE VITAMIN (MULTIVITAMIN) CAPSULE    Take 1 capsule by mouth every morning.  NITROGLYCERIN (NITROSTAT) 0.4 MG SL TABLET    Place 1 tablet (0.4 mg total) under the tongue every 5 (five) minutes x 3 doses as needed. For chest pain   OMEPRAZOLE (PRILOSEC) 20 MG CAPSULE    Take 20 mg by mouth every morning.    PRENATAL VIT-FE FUMARATE-FA (MULTIVITAMIN-PRENATAL) 27-0.8 MG TABS    Take 1 tablet by mouth every evening.   TAMSULOSIN (FLOMAX) 0.4 MG CAPS    TAKE 1 CAPSULE (0.4 MG TOTAL) BY MOUTH DAILY AFTER SUPPER.   TRAMADOL (ULTRAM) 50 MG TABLET    Take 0.5 tablets (25 mg total) by mouth every 6 (six) hours as needed for pain.   TRAVOPROST, BAK FREE, (TRAVATAN) 0.004 % SOLN OPHTHALMIC SOLUTION    Place 1 drop into both eyes 2 (two) times daily.  Modified Medications   No medications on file  Discontinued Medications   No medications on file     Physical Exam: Filed Vitals:   07/07/13 1528  BP: 138/84  Pulse: 51  Temp: 97.4 F (36.3 C)  TempSrc: Oral  Resp: 14  Height: 5\' 8"  (1.727 m)  Weight: 138 lb 6.4 oz (62.778 kg)  Physical Exam  Constitutional: He is oriented to person, place, and time. No distress.  Thin white male  HENT:  Head: Normocephalic and atraumatic.  Cardiovascular: Normal rate, regular rhythm, normal heart sounds and intact distal pulses.   Pulmonary/Chest: Effort normal and breath sounds normal. No respiratory distress.  Abdominal: Soft. Bowel sounds are normal. He exhibits no distension. There is no tenderness.  Musculoskeletal: He exhibits no edema and no tenderness.  Increased difficulty elevating his right leg since hospitalization  Neurological: He is alert and oriented to person, place, and  time.  Skin: Skin is warm and dry.    Labs reviewed: Basic Metabolic Panel:  Recent Labs  95/62/13 0944 06/16/13 1327 06/19/13 0949  NA 132* 137 133*  K 4.3 6.0* 3.7  CL 88* 99 97  CO2 35* 27 37*  GLUCOSE 324* 173* 251*  BUN 56* 43* 44*  CREATININE 2.37* 2.02* 2.3*  CALCIUM 9.3 9.2 8.9   Liver Function Tests:  Recent Labs  04/07/13 1702 04/11/13 0432 04/12/13 0430  AST 19 97* 39*  ALT 16 181* 128*  ALKPHOS 109 72 100  BILITOT 0.5 0.7 0.7  PROT 7.9 6.0 6.0  ALBUMIN 3.5 2.2* 2.1*  CBC:  Recent Labs  05/14/13 1311 06/04/13 1306 06/25/13 1313  WBC 7.1 8.7 5.7  NEUTROABS 5.0 5.4 3.3  HGB 11.0* 9.2* 9.1*  HCT 33.4* 27.7* 28.1*  MCV 93.6 92.5 94.1  PLT 148 185 109*   Lipid Panel: No results found for this basename: CHOL, HDL, LDLCALC, TRIG, CHOLHDL, LDLDIRECT,  in the last 8760 hours Lab Results  Component Value Date   HGBA1C 6.0* 02/06/2012   Assessment/Plan: 1. Diabetes mellitus type 2, controlled, with complications Check routine labs that are overdue b/c of hospitalization and rehab stay - Microalbumin/Creatinine Ratio, Urine - Hemoglobin A1c - CMP  2. Chronic systolic heart failure -stable again and back on his lasix  3. Chronic kidney disease, stage III (moderate) - Microalbumin/Creatinine Ratio, Urine - f/u cmp, also for renal function  4. Essential hypertension, benign - at goal, no changes necessary  5. Anemia - CBC with Differential--gets labs q 3 wks with as needed procrit for hgb <10  Labs/tests ordered: urine microalbumin, cbc, cmp, hba1c Next appt:  3 mos

## 2013-07-08 LAB — MICROALBUMIN / CREATININE URINE RATIO
Creatinine, Ur: 38.4 mg/dL (ref 22.0–328.0)
MICROALB/CREAT RATIO: 30.2 mg/g creat — ABNORMAL HIGH (ref 0.0–30.0)
Microalbumin, Urine: 11.6 ug/mL (ref 0.0–17.0)

## 2013-07-08 LAB — CBC WITH DIFFERENTIAL/PLATELET
Basophils Absolute: 0 10*3/uL (ref 0.0–0.2)
Basos: 0 % (ref 0–3)
Eos: 2 % (ref 0–5)
Eosinophils Absolute: 0.1 10*3/uL (ref 0.0–0.4)
HCT: 34.9 % — ABNORMAL LOW (ref 37.5–51.0)
Hemoglobin: 10.6 g/dL — ABNORMAL LOW (ref 12.6–17.7)
Immature Grans (Abs): 0 10*3/uL (ref 0.0–0.1)
Immature Granulocytes: 0 % (ref 0–2)
Lymphocytes Absolute: 2.1 10*3/uL (ref 0.7–3.1)
Lymphs: 43 % (ref 14–46)
MCH: 29.1 pg (ref 26.6–33.0)
MCHC: 30.4 g/dL — ABNORMAL LOW (ref 31.5–35.7)
MCV: 96 fL (ref 79–97)
Monocytes Absolute: 0.4 10*3/uL (ref 0.1–0.9)
Monocytes: 7 % (ref 4–12)
Neutrophils Absolute: 2.3 10*3/uL (ref 1.4–7.0)
Neutrophils Relative %: 48 % (ref 40–74)
RBC: 3.64 x10E6/uL — ABNORMAL LOW (ref 4.14–5.80)
RDW: 15.1 % (ref 12.3–15.4)
WBC: 4.8 10*3/uL (ref 3.4–10.8)

## 2013-07-08 LAB — COMPREHENSIVE METABOLIC PANEL
ALT: 12 IU/L (ref 0–44)
AST: 13 IU/L (ref 0–40)
Albumin/Globulin Ratio: 0.9 — ABNORMAL LOW (ref 1.1–2.5)
Albumin: 3.5 g/dL (ref 3.5–4.7)
Alkaline Phosphatase: 87 IU/L (ref 39–117)
BUN/Creatinine Ratio: 18 (ref 10–22)
BUN: 39 mg/dL — ABNORMAL HIGH (ref 8–27)
CO2: 31 mmol/L — ABNORMAL HIGH (ref 18–29)
Calcium: 9.4 mg/dL (ref 8.6–10.2)
Chloride: 98 mmol/L (ref 97–108)
Creatinine, Ser: 2.2 mg/dL — ABNORMAL HIGH (ref 0.76–1.27)
GFR calc Af Amer: 30 mL/min/{1.73_m2} — ABNORMAL LOW (ref >59)
GFR calc non Af Amer: 26 mL/min/{1.73_m2} — ABNORMAL LOW (ref >59)
Globulin, Total: 3.7 g/dL (ref 1.5–4.5)
Glucose: 135 mg/dL — ABNORMAL HIGH (ref 65–99)
Potassium: 4.4 mmol/L (ref 3.5–5.2)
Sodium: 142 mmol/L (ref 134–144)
Total Bilirubin: 0.3 mg/dL (ref 0.0–1.2)
Total Protein: 7.2 g/dL (ref 6.0–8.5)

## 2013-07-08 LAB — HEMOGLOBIN A1C
Est. average glucose Bld gHb Est-mCnc: 143 mg/dL
Hgb A1c MFr Bld: 6.6 % — ABNORMAL HIGH (ref 4.8–5.6)

## 2013-07-16 ENCOUNTER — Other Ambulatory Visit (HOSPITAL_BASED_OUTPATIENT_CLINIC_OR_DEPARTMENT_OTHER): Payer: Medicare Other | Admitting: Lab

## 2013-07-16 ENCOUNTER — Ambulatory Visit (HOSPITAL_BASED_OUTPATIENT_CLINIC_OR_DEPARTMENT_OTHER): Payer: Medicare Other

## 2013-07-16 VITALS — BP 124/47 | HR 56 | Temp 97.7°F

## 2013-07-16 DIAGNOSIS — D649 Anemia, unspecified: Secondary | ICD-10-CM | POA: Diagnosis not present

## 2013-07-16 DIAGNOSIS — N189 Chronic kidney disease, unspecified: Secondary | ICD-10-CM

## 2013-07-16 LAB — CBC WITH DIFFERENTIAL/PLATELET
Basophils Absolute: 0 10*3/uL (ref 0.0–0.1)
EOS%: 1.1 % (ref 0.0–7.0)
HGB: 9.8 g/dL — ABNORMAL LOW (ref 13.0–17.1)
MCH: 28.9 pg (ref 27.2–33.4)
MCV: 95.3 fL (ref 79.3–98.0)
MONO%: 6.9 % (ref 0.0–14.0)
NEUT#: 2.7 10*3/uL (ref 1.5–6.5)
RBC: 3.39 10*6/uL — ABNORMAL LOW (ref 4.20–5.82)
RDW: 15.2 % — ABNORMAL HIGH (ref 11.0–14.6)
lymph#: 1.6 10*3/uL (ref 0.9–3.3)

## 2013-07-16 MED ORDER — DARBEPOETIN ALFA-POLYSORBATE 300 MCG/0.6ML IJ SOLN
300.0000 ug | Freq: Once | INTRAMUSCULAR | Status: AC
  Administered 2013-07-16: 300 ug via SUBCUTANEOUS
  Filled 2013-07-16: qty 0.6

## 2013-08-06 ENCOUNTER — Ambulatory Visit: Payer: Medicare Other

## 2013-08-06 ENCOUNTER — Other Ambulatory Visit (HOSPITAL_BASED_OUTPATIENT_CLINIC_OR_DEPARTMENT_OTHER): Payer: Medicare Other | Admitting: Lab

## 2013-08-06 DIAGNOSIS — D649 Anemia, unspecified: Secondary | ICD-10-CM | POA: Diagnosis not present

## 2013-08-06 DIAGNOSIS — N179 Acute kidney failure, unspecified: Secondary | ICD-10-CM

## 2013-08-06 LAB — CBC WITH DIFFERENTIAL/PLATELET
Basophils Absolute: 0 10*3/uL (ref 0.0–0.1)
Eosinophils Absolute: 0.1 10*3/uL (ref 0.0–0.5)
HGB: 10.8 g/dL — ABNORMAL LOW (ref 13.0–17.1)
MONO#: 0.4 10*3/uL (ref 0.1–0.9)
NEUT#: 2.8 10*3/uL (ref 1.5–6.5)
RBC: 3.78 10*6/uL — ABNORMAL LOW (ref 4.20–5.82)
RDW: 15.5 % — ABNORMAL HIGH (ref 11.0–14.6)
WBC: 5 10*3/uL (ref 4.0–10.3)
lymph#: 1.7 10*3/uL (ref 0.9–3.3)

## 2013-08-06 MED ORDER — DARBEPOETIN ALFA-POLYSORBATE 300 MCG/0.6ML IJ SOLN
300.0000 ug | Freq: Once | INTRAMUSCULAR | Status: DC
  Filled 2013-08-06: qty 0.6

## 2013-08-10 ENCOUNTER — Other Ambulatory Visit: Payer: Self-pay | Admitting: Cardiology

## 2013-08-12 ENCOUNTER — Encounter (HOSPITAL_COMMUNITY): Payer: Medicare Other

## 2013-08-12 DIAGNOSIS — H4011X0 Primary open-angle glaucoma, stage unspecified: Secondary | ICD-10-CM | POA: Diagnosis not present

## 2013-08-12 DIAGNOSIS — H409 Unspecified glaucoma: Secondary | ICD-10-CM | POA: Diagnosis not present

## 2013-08-18 ENCOUNTER — Ambulatory Visit (HOSPITAL_COMMUNITY)
Admission: RE | Admit: 2013-08-18 | Discharge: 2013-08-18 | Disposition: A | Discharge status: Home / Self Care | Payer: Medicare Other | Source: Ambulatory Visit | Attending: Internal Medicine | Admitting: Internal Medicine

## 2013-08-18 VITALS — BP 130/68 | HR 52 | Wt 134.8 lb

## 2013-08-18 DIAGNOSIS — I5022 Chronic systolic (congestive) heart failure: Secondary | ICD-10-CM | POA: Insufficient documentation

## 2013-08-18 DIAGNOSIS — Z952 Presence of prosthetic heart valve: Secondary | ICD-10-CM | POA: Insufficient documentation

## 2013-08-18 DIAGNOSIS — N189 Chronic kidney disease, unspecified: Secondary | ICD-10-CM | POA: Diagnosis not present

## 2013-08-18 DIAGNOSIS — I2510 Atherosclerotic heart disease of native coronary artery without angina pectoris: Secondary | ICD-10-CM | POA: Insufficient documentation

## 2013-08-18 MED ORDER — AMOXICILLIN 500 MG PO CAPS: 2000.0000 mg | ORAL_CAPSULE | ORAL | Status: DC | PRN

## 2013-08-18 NOTE — Progress Notes (Signed)
Patient ID: [name], male   DOB: Jun 15, 1926, 79 y.o.   MRN: [id number]  PCP: [name]  HPI: [name] is a 79 y.o. male with a PMHx of CAD s/p  CABG & AVR 2/13, anemia, CKD last creatinine 2.2, Chronic systolic heart failure EF 40%    He is not on an ACE or ARB due renal failure.   Admitted to [name] 4/28 through 04/15/13  with volume overload. Diuresed with IV lasix. Discharge weight 139 pounds.   05/28/13 Creatinine 2.37 Potassium 4.3  Echo 4/14: Left ventricle:  Significant HK inferior wall and inferolateral wall. Mildly to moderately dilated. The EF was 40%. Findings consistent with left ventricular diastolic dysfunction. Doppler parameters are consistent with high ventricular filling pressure. - Mitral valve:  Mild regurgitation. - Right ventricle: The cavity size was mildly to moderately dilated. Systolic function was mildly to moderately reduced.  Follow up:  Reports doing well. Weight at home 130-134 lbs. Holds lasix if weight 131 lbs and takes extra lasix if weight greater than 135lbs. He has not needed any extra lasix. Denies SOB, PND, orthopnea, edema or CP. Wife manages his medications.   SH: Lives with wife in [name]   Past Medical History  Diagnosis Date  . History of colon cancer     s/p colon resection  . Aortic stenosis     a. s/p AVR with 23mm Edwards pericardial valve 02/07/12 - post-op course complicated by pleural effusion requring thoracentesis/leg cellulitis 03/2012.  Marland Kitchen CAD (coronary artery disease)     a. s/p NSTEMI 12/2011:  LHC - Ostial left main 20%, ostial LAD 50%, mid 60-70%, ostial D1 40% and mid 40%, D2 70%, ostial circumflex occluded, ostial RCA 80-90%, LVEDP was 42. b.  s/p CABG x 3 at time of AVR (LiMA-LAD, SVG-2nd daigonal, SVG-PDA) 02/07/12 (post-op course noted above).  . Ischemic cardiomyopathy   . Chronic systolic heart failure     a. TEE 01/2012: EF 25-30%, diffuse hypokinesis. b. Not on ACEI due to renal insufficiency.;  c. follow up   echo 08/06/12: EF 25%, mod diast dysfxn, AVR ok, mild [name], mod LAE, mild RAE, mild to mod RV systolic dysfunction  . HLD (hyperlipidemia)   . GERD (gastroesophageal reflux disease)   . Chronic kidney disease   . Pleural effusion     a. post-operatively after AVR/CABG s/p thoracentesis 03/2012 yielding 1L serosanguinous fluid.  . Diabetes mellitus     borderline  . Blood transfusion     NO REACTION TO TRANSFUSION  . Cellulitis     a. RLE cellulitis 2 months post-operatively after AVR/CABG - serratia marcessans, tx with I&D/antibiotics  . Mitral regurgitation     Moderate by TEE 01/2012  . Flash pulmonary edema     Post-cath 12/2011, went into acute pulm edema requiring IV lasix and intubation  . Baker's cyst 07/23/12    Incidental finding of LE venous dopplers  . Cataract     right eye, hx of  . HTN (hypertension)     primary, [name]  . Myocardial infarction 12/2011  . CHF (congestive heart failure)   . Stroke 10/01    RIght Leg weakness  . Cancer '90's    Colon  . Anemia     Current Outpatient Prescriptions  Medication Sig Dispense Refill  . aspirin EC 325 MG tablet Take 325 mg by mouth every morning.       . carvedilol (COREG) 25 MG tablet Take 25 mg by mouth 2 (two)  times daily with a meal.      . cholecalciferol (VITAMIN D) 1000 UNITS tablet Take 1,000 Units by mouth every morning.       . furosemide (LASIX) 40 MG tablet Take 2 tablets (80 mg total) by mouth daily.  30 tablet  9  . hydrALAZINE (APRESOLINE) 25 MG tablet Take 25 mg by mouth 3 (three) times daily.      . isosorbide mononitrate (IMDUR) 60 MG 24 hr tablet Take 1 tablet (60 mg total) by mouth daily.  30 tablet  0  . Multiple Vitamin (MULTIVITAMIN) capsule Take 1 capsule by mouth every morning.       . nitroGLYCERIN (NITROSTAT) 0.4 MG SL tablet Place 1 tablet (0.4 mg total) under the tongue every 5 (five) minutes x 3 doses as needed. For chest pain  25 tablet  5  . omeprazole (PRILOSEC) 20 MG capsule Take 20  mg by mouth every morning.       . Prenatal Vit-Fe Fumarate-FA (MULTIVITAMIN-PRENATAL) 27-0.8 MG TABS Take 1 tablet by mouth every evening.      . tamsulosin (FLOMAX) 0.4 MG CAPS TAKE 1 CAPSULE (0.4 MG TOTAL) BY MOUTH DAILY AFTER SUPPER.  30 capsule  3  . traMADol (ULTRAM) 50 MG tablet Take 0.5 tablets (25 mg total) by mouth every 6 (six) hours as needed for pain.  30 tablet  0  . Travoprost, BAK Free, (TRAVATAN) 0.004 % SOLN ophthalmic solution Place 1 drop into both eyes 2 (two) times daily.      Marland Kitchen amoxicillin (AMOXIL) 500 MG capsule Take 4 capsules (2,000 mg total) by mouth as needed. Before dental procedures.  4 capsule  6  . [DISCONTINUED] metFORMIN (GLUCOPHAGE) 500 MG tablet Take 500-1,000 mg by mouth. 2 tabs in the am, 1 tab in the pm       No current facility-administered medications for this encounter.    Family History  Problem Relation Age of Onset  . Cancer Mother   . Heart attack Father   . Mental illness Brother   . Kidney failure Brother    Filed Vitals:   08/18/13 1345  BP: 130/68  Pulse: 52  Weight: 134 lb 12 oz (61.122 kg)  SpO2: 99%    PHYSICAL EXAM: General:  Elderly chronically ill appearing. No respiratory difficulty Wife  present HEENT: normal Neck: supple. JVD 5-6. Carotids 2+ bilat; no bruits. No lymphadenopathy or thryomegaly appreciated. Cor: PMI nondisplaced. Regular rate & rhythm. No rubs, gallops or murmurs. Lungs: clear Abdomen: soft, nontender, nondistended. No hepatosplenomegaly. No bruits or masses. Good bowel sounds. Extremities: no cyanosis, clubbing, rash, edema Neuro: alert & oriented x 3, cranial nerves grossly intact. moves all 4 extremities w/o difficulty. Affect pleasant.   ASSESSMENT & PLAN:  1) Chronic systolic HF, EF 40% (03/2013)  - NYHA II symptoms, volume status good.  - On goal dose coreg 25 mg BID. - Continue hydralazine, IMDUR and lasix at current dose. --Would not push meds at this point due to risk of symptomatic  hypotension.  2) CKD - Stable, last Cr 2.2 which is baseline for patient  3) AVR- - SBE prophylaxis reviewed. iInstructed patient to take 2 gm of Amoxicillin before dental procedures.   4) CAD s/p CABG -No evidence of ischemia. Continue current regimen.    Follow up 6 months.   Truman Hayward 2:37 PM

## 2013-08-18 NOTE — Patient Instructions (Addendum)
Doing great.  Will send prescription in for Amoxicillin 2 gm before all dental procedures.  Call any issues.  Follow up 6 months.

## 2013-08-25 ENCOUNTER — Other Ambulatory Visit: Payer: Self-pay | Admitting: Oncology

## 2013-08-27 ENCOUNTER — Ambulatory Visit: Payer: Medicare Other

## 2013-08-27 ENCOUNTER — Other Ambulatory Visit (HOSPITAL_BASED_OUTPATIENT_CLINIC_OR_DEPARTMENT_OTHER): Payer: Medicare Other

## 2013-08-27 DIAGNOSIS — D649 Anemia, unspecified: Secondary | ICD-10-CM | POA: Diagnosis not present

## 2013-08-27 DIAGNOSIS — N179 Acute kidney failure, unspecified: Secondary | ICD-10-CM

## 2013-08-27 LAB — CBC WITH DIFFERENTIAL/PLATELET
BASO%: 0.3 % (ref 0.0–2.0)
HCT: 33.4 % — ABNORMAL LOW (ref 38.4–49.9)
MCHC: 32.3 g/dL (ref 32.0–36.0)
MONO#: 0.4 10*3/uL (ref 0.1–0.9)
NEUT%: 58.1 % (ref 39.0–75.0)
WBC: 5.7 10*3/uL (ref 4.0–10.3)
lymph#: 1.8 10*3/uL (ref 0.9–3.3)

## 2013-08-27 MED ORDER — DARBEPOETIN ALFA-POLYSORBATE 300 MCG/0.6ML IJ SOLN: 300.0000 ug | Freq: Once | INTRAMUSCULAR | Status: DC

## 2013-09-03 ENCOUNTER — Other Ambulatory Visit: Payer: Self-pay | Admitting: Nurse Practitioner

## 2013-09-04 ENCOUNTER — Other Ambulatory Visit: Payer: Self-pay | Admitting: Cardiology

## 2013-09-17 ENCOUNTER — Ambulatory Visit: Payer: Medicare Other

## 2013-09-17 ENCOUNTER — Encounter (INDEPENDENT_AMBULATORY_CARE_PROVIDER_SITE_OTHER): Payer: Self-pay

## 2013-09-17 ENCOUNTER — Other Ambulatory Visit (HOSPITAL_BASED_OUTPATIENT_CLINIC_OR_DEPARTMENT_OTHER): Payer: Medicare Other | Admitting: Lab

## 2013-09-17 DIAGNOSIS — D649 Anemia, unspecified: Secondary | ICD-10-CM | POA: Diagnosis not present

## 2013-09-17 DIAGNOSIS — N179 Acute kidney failure, unspecified: Secondary | ICD-10-CM

## 2013-09-17 LAB — CBC WITH DIFFERENTIAL/PLATELET
Basophils Absolute: 0 10*3/uL (ref 0.0–0.1)
HCT: 33.3 % — ABNORMAL LOW (ref 38.4–49.9)
HGB: 10.8 g/dL — ABNORMAL LOW (ref 13.0–17.1)
MCH: 29.7 pg (ref 27.2–33.4)
MONO#: 0.4 10*3/uL (ref 0.1–0.9)
NEUT%: 57.7 % (ref 39.0–75.0)
WBC: 6 10*3/uL (ref 4.0–10.3)
lymph#: 2 10*3/uL (ref 0.9–3.3)

## 2013-09-17 MED ORDER — DARBEPOETIN ALFA-POLYSORBATE 300 MCG/0.6ML IJ SOLN
300.0000 ug | Freq: Once | INTRAMUSCULAR | Status: DC
  Filled 2013-09-17: qty 0.6

## 2013-09-22 ENCOUNTER — Other Ambulatory Visit: Payer: Self-pay | Admitting: Cardiology

## 2013-09-22 DIAGNOSIS — Z23 Encounter for immunization: Secondary | ICD-10-CM | POA: Diagnosis not present

## 2013-09-26 ENCOUNTER — Other Ambulatory Visit: Payer: Self-pay | Admitting: Cardiology

## 2013-09-29 ENCOUNTER — Other Ambulatory Visit: Payer: Self-pay | Admitting: Cardiology

## 2013-09-29 ENCOUNTER — Other Ambulatory Visit: Payer: Self-pay | Admitting: *Deleted

## 2013-09-29 MED ORDER — TAMSULOSIN HCL 0.4 MG PO CAPS: ORAL_CAPSULE | ORAL | Status: DC

## 2013-10-08 ENCOUNTER — Other Ambulatory Visit (HOSPITAL_BASED_OUTPATIENT_CLINIC_OR_DEPARTMENT_OTHER): Payer: Medicare Other | Admitting: Lab

## 2013-10-08 ENCOUNTER — Ambulatory Visit: Payer: Medicare Other

## 2013-10-08 DIAGNOSIS — D649 Anemia, unspecified: Secondary | ICD-10-CM

## 2013-10-08 DIAGNOSIS — N179 Acute kidney failure, unspecified: Secondary | ICD-10-CM

## 2013-10-08 LAB — CBC WITH DIFFERENTIAL/PLATELET
BASO%: 0.3 % (ref 0.0–2.0)
EOS%: 2.8 % (ref 0.0–7.0)
Eosinophils Absolute: 0.2 10*3/uL (ref 0.0–0.5)
HCT: 32.6 % — ABNORMAL LOW (ref 38.4–49.9)
LYMPH%: 33.8 % (ref 14.0–49.0)
MCH: 30.1 pg (ref 27.2–33.4)
MCV: 92.5 fL (ref 79.3–98.0)
MONO#: 0.4 10*3/uL (ref 0.1–0.9)
NEUT#: 3.1 10*3/uL (ref 1.5–6.5)
Platelets: 95 10*3/uL — ABNORMAL LOW (ref 140–400)
RBC: 3.52 10*6/uL — ABNORMAL LOW (ref 4.20–5.82)
WBC: 5.5 10*3/uL (ref 4.0–10.3)

## 2013-10-08 MED ORDER — DARBEPOETIN ALFA-POLYSORBATE 300 MCG/0.6ML IJ SOLN: 300.0000 ug | Freq: Once | INTRAMUSCULAR | Status: DC

## 2013-10-13 ENCOUNTER — Other Ambulatory Visit: Payer: Self-pay | Admitting: Internal Medicine

## 2013-10-16 ENCOUNTER — Ambulatory Visit (INDEPENDENT_AMBULATORY_CARE_PROVIDER_SITE_OTHER): Payer: Medicare Other | Admitting: Internal Medicine

## 2013-10-16 ENCOUNTER — Encounter: Payer: Self-pay | Admitting: Internal Medicine

## 2013-10-16 VITALS — BP 150/78 | HR 52 | Temp 97.8°F | Resp 16 | Wt 131.0 lb

## 2013-10-16 DIAGNOSIS — E119 Type 2 diabetes mellitus without complications: Secondary | ICD-10-CM | POA: Diagnosis not present

## 2013-10-16 DIAGNOSIS — R29898 Other symptoms and signs involving the musculoskeletal system: Secondary | ICD-10-CM

## 2013-10-16 DIAGNOSIS — I10 Essential (primary) hypertension: Secondary | ICD-10-CM

## 2013-10-16 DIAGNOSIS — I5022 Chronic systolic (congestive) heart failure: Secondary | ICD-10-CM

## 2013-10-16 DIAGNOSIS — D649 Anemia, unspecified: Secondary | ICD-10-CM | POA: Diagnosis not present

## 2013-10-16 DIAGNOSIS — N183 Chronic kidney disease, stage 3 unspecified: Secondary | ICD-10-CM

## 2013-10-16 NOTE — Progress Notes (Signed)
Patient ID: [name] [name], male   DOB: 09-Sep-1926, 79 y.o.   MRN: [id number] Location:  [facility] / [facility]  Allergies  Allergen Reactions  . Statins     Pain/weakness in legs    Chief Complaint  Patient presents with  . Medical Managment of Chronic Issues    3 month f/u  . other    average BS 117-134 fasting    HPI: Patient is a 79 y.o. male with h/o chronic systolic chf, DMII controlled with circulatory complications, htn, AS s/p AVR, CAD, stroke with right LE weakness for which he wears a brace  seen in the office today for medical management of chronic issues.   No issues since last visit. States that overall he is feeling much better. States that this past year he has been through the mill. States that his right leg is slowly coming along.  Pt brought bp/CBG/ Wt log with him and SBP has been running in the 130s for the last three months. CBGs avg 100-120 and wt has remained stable around 132lbs. He is proud of his legs staying edema-free.  States that he is no longer seeing the oncologist as much as he used to.  His next visit for labs is 11/19.   He also just visited with his cardiologist.    Review of Systems:  Review of Systems  Constitutional: Negative for fever, chills, weight loss and malaise/fatigue.  HENT: Negative for hearing loss.   Eyes: Negative for blurred vision and double vision.  Respiratory: Negative for shortness of breath.   Cardiovascular: Negative for chest pain.  Gastrointestinal: Negative for nausea, vomiting, diarrhea and constipation.  Genitourinary: Positive for frequency.       D/t lasix  Musculoskeletal: Positive for joint pain.       D/t right knee (prior infection). States that it is much better. Still has a limp but thinks with some time that will resolve.   Neurological: Negative for dizziness.  Endo/Heme/Allergies: Bruises/bleeds easily.  Psychiatric/Behavioral: Negative for depression. The patient is not  nervous/anxious.    Past Medical History  Diagnosis Date  . History of colon cancer     s/p colon resection  . Aortic stenosis     a. s/p AVR with 23mm Edwards pericardial valve 02/07/12 - post-op course complicated by pleural effusion requring thoracentesis/leg cellulitis 03/2012.  Marland Kitchen CAD (coronary artery disease)     a. s/p NSTEMI 12/2011:  LHC - Ostial left main 20%, ostial LAD 50%, mid 60-70%, ostial D1 40% and mid 40%, D2 70%, ostial circumflex occluded, ostial RCA 80-90%, LVEDP was 42. b.  s/p CABG x 3 at time of AVR (LiMA-LAD, SVG-2nd daigonal, SVG-PDA) 02/07/12 (post-op course noted above).  . Ischemic cardiomyopathy   . Chronic systolic heart failure     a. TEE 01/2012: EF 25-30%, diffuse hypokinesis. b. Not on ACEI due to renal insufficiency.;  c. follow up  echo 08/06/12: EF 25%, mod diast dysfxn, AVR ok, mild MR, mod LAE, mild RAE, mild to mod RV systolic dysfunction  . HLD (hyperlipidemia)   . GERD (gastroesophageal reflux disease)   . Chronic kidney disease   . Pleural effusion     a. post-operatively after AVR/CABG s/p thoracentesis 03/2012 yielding 1L serosanguinous fluid.  . Diabetes mellitus     borderline  . Blood transfusion     NO REACTION TO TRANSFUSION  . Cellulitis     a. RLE cellulitis 2 months post-operatively after AVR/CABG - serratia marcessans, tx  with I&D/antibiotics  . Mitral regurgitation     Moderate by TEE 01/2012  . Flash pulmonary edema     Post-cath 12/2011, went into acute pulm edema requiring IV lasix and intubation  . Baker's cyst 07/23/12    Incidental finding of LE venous dopplers  . Cataract     right eye, hx of  . HTN (hypertension)     primary, Dr. Dorris Fetch  . Myocardial infarction 12/2011  . CHF (congestive heart failure)   . Stroke 10/01    RIght Leg weakness  . Cancer '90's    Colon  . Anemia     Past Surgical History  Procedure Laterality Date  . Colon resection  1996  . Esophageal dilation    . Colonoscopy    . Cataract  extraction      rt  . Cardiac catheterization      1.3.13  stopped breathing, put on ventilator for 5-6 days  . Tonsillectomy    . Aortic valve replacement  02/07/2012    Procedure: AORTIC VALVE REPLACEMENT (AVR);  Surgeon: [name], MD;  Location: [facility] OR;  Service: Open Heart Surgery;  Laterality: N/A;  . Coronary artery bypass graft  02/07/2012    Procedure: CORONARY ARTERY BYPASS GRAFTING (CABG);  Surgeon: [name], MD;  Location: [facility] [place] OR;  Service: Open Heart Surgery;  Laterality: N/A;  CABG x three; using right leg greater saphenous vein harvested endoscopically  . Chest tube insertion  07/24/2012    Procedure: INSERTION PLEURAL DRAINAGE CATHETER;  Surgeon: [name], MD;  Location: [facility] OR;  Service: Thoracic;  Laterality: Left;  . Talc pleurodesis  08/30/2012    Procedure: TALC PLEURADESIS;  Surgeon: [name], MD;  Location: [facility] OR;  Service: Thoracic;  Laterality: Left;  INSERTION OF TALC VIA LEFT PLEURX  . Talc pleurodesis  09/12/2012    Procedure: TALC PLEURADESIS;  Surgeon: [name], MD;  Location: [facility] OR;  Service: Thoracic;  Laterality: Left;  . Removal of pleural drainage catheter  09/27/2012    Procedure: REMOVAL OF PLEURAL DRAINAGE CATHETER;  Surgeon: [name], MD;  Location: [facility] OR;  Service: Thoracic;  Laterality: Left;  Marland Kitchen Eye surgery  2009    cataract removed from right eye    Social History:   reports that he has never smoked. He has never used smokeless tobacco. He reports that he does not drink alcohol or use illicit drugs.  Family History  Problem Relation Age of Onset  . Cancer Mother   . Heart attack Father   . Mental illness Brother   . Kidney failure Brother     Medications: Patient's Medications  New Prescriptions   No medications on file  Previous Medications   AMOXICILLIN (AMOXIL) 500 MG CAPSULE    Take 4 capsules (2,000 mg total) by mouth as needed. Before dental procedures.   ASPIRIN EC 325 MG TABLET    Take 325 mg by mouth  every morning.    CARVEDILOL (COREG) 25 MG TABLET    Take 25 mg by mouth 2 (two) times daily with a meal.   CHOLECALCIFEROL (VITAMIN D) 1000 UNITS TABLET    Take 1,000 Units by mouth every morning.    FUROSEMIDE (LASIX) 40 MG TABLET    Take 2 tablets (80 mg total) by mouth daily.   HYDRALAZINE (APRESOLINE) 25 MG TABLET    Take 25 mg by mouth 3 (three) times daily.   ISOSORBIDE MONONITRATE (IMDUR) 30 MG 24  HR TABLET    TAKE 1 TABLET BY MOUTH EVERY DAY   MULTIPLE VITAMIN (MULTIVITAMIN) CAPSULE    Take 1 capsule by mouth every morning.    NITROGLYCERIN (NITROSTAT) 0.4 MG SL TABLET    Place 1 tablet (0.4 mg total) under the tongue every 5 (five) minutes x 3 doses as needed. For chest pain   OMEPRAZOLE (PRILOSEC) 20 MG CAPSULE    Take 20 mg by mouth every morning.    PRENATAL VIT-FE FUMARATE-FA (MULTIVITAMIN-PRENATAL) 27-0.8 MG TABS    Take 1 tablet by mouth every evening.   TAMSULOSIN (FLOMAX) 0.4 MG CAPS CAPSULE    Take one capsule by mouth daily after supper.   TRAMADOL (ULTRAM) 50 MG TABLET    Take 0.5 tablets (25 mg total) by mouth every 6 (six) hours as needed for pain.   TRAVOPROST, BAK FREE, (TRAVATAN) 0.004 % SOLN OPHTHALMIC SOLUTION    Place 1 drop into both eyes 2 (two) times daily.  Modified Medications   No medications on file  Discontinued Medications   No medications on file     Physical Exam: Filed Vitals:   10/16/13 1430  BP: 150/78  Pulse: 52  Temp: 97.8 F (36.6 C)  TempSrc: Oral  Resp: 16  Weight: 131 lb (59.421 kg)  SpO2: 99%   Physical Exam  Constitutional: He is oriented to person, place, and time. He appears well-developed and well-nourished.  Cardiovascular: Normal rate, regular rhythm, normal heart sounds and intact distal pulses.   Pulmonary/Chest: Effort normal and breath sounds normal.  Abdominal: Soft. Bowel sounds are normal.  Musculoskeletal: He exhibits no edema and no tenderness.  Wearing knee brace on right leg  Neurological: He is alert and  oriented to person, place, and time.  Skin: Skin is warm and dry.  Psychiatric: He has a normal mood and affect.     Labs reviewed: Basic Metabolic Panel:  Recent Labs  16/10/96 1327 06/19/13 0949 07/07/13 1626  NA 137 133* 142  K 6.0* 3.7 4.4  CL 99 97 98  CO2 27 37* 31*  GLUCOSE 173* 251* 135*  BUN 43* 44* 39*  CREATININE 2.02* 2.3* 2.20*  CALCIUM 9.2 8.9 9.4   Liver Function Tests:  Recent Labs  04/07/13 1702 04/11/13 0432 04/12/13 0430 07/07/13 1626  AST 19 97* 39* 13  ALT 16 181* 128* 12  ALKPHOS 109 72 100 87  BILITOT 0.5 0.7 0.7 0.3  PROT 7.9 6.0 6.0 7.2  ALBUMIN 3.5 2.2* 2.1*  --   CBC:  Recent Labs  08/27/13 1314 09/17/13 1324 10/08/13 1337  WBC 5.7 6.0 5.5  NEUTROABS 3.3 3.5 3.1  HGB 10.8* 10.8* 10.6*  HCT 33.4* 33.3* 32.6*  MCV 91.7 91.4 92.5  PLT 105* 104* 95*   Lipid Panel:  Lab Results  Component Value Date   HGBA1C 6.6* 07/07/2013   Assessment/Plan 1. Essential hypertension, benign - Controlled per his record, slightly elevated today in the office only - Basic metabolic panel; Future--hopefully, this can be drawn 11/19 with his cbc at oncology  2. Type II or unspecified type diabetes mellitus without mention of complication, not stated as uncontrolled - Controlled on diet and exercise alone- CBG ranging from 100-120  3. Anemia - Blood count slightly decreased but stable compared to previous blood counts - Will check kidney function with his next blood draw on  11/19 - Basic metabolic panel; Future  4. Right leg weakness s/p CVA -improved, still wearing a knee brace   5.  Chronic kidney disease, stage III (moderate) - Stable - will check kidney function with his next blood draw on 11/19 -avoid nsaids  Labs/tests ordered:  Has upcoming cbc, add bmp;  Check hba1c and FLP at 6 month f/u Next appt: BMP 11/19 with CBC draw

## 2013-10-29 ENCOUNTER — Other Ambulatory Visit (HOSPITAL_BASED_OUTPATIENT_CLINIC_OR_DEPARTMENT_OTHER): Payer: Medicare Other

## 2013-10-29 ENCOUNTER — Ambulatory Visit (HOSPITAL_BASED_OUTPATIENT_CLINIC_OR_DEPARTMENT_OTHER): Payer: Medicare Other | Admitting: Oncology

## 2013-10-29 ENCOUNTER — Telehealth: Payer: Self-pay | Admitting: Oncology

## 2013-10-29 ENCOUNTER — Ambulatory Visit: Payer: Medicare Other

## 2013-10-29 ENCOUNTER — Other Ambulatory Visit: Payer: Self-pay | Admitting: *Deleted

## 2013-10-29 VITALS — BP 154/61 | HR 61 | Temp 96.6°F | Resp 17 | Ht 68.0 in | Wt 142.4 lb

## 2013-10-29 DIAGNOSIS — D696 Thrombocytopenia, unspecified: Secondary | ICD-10-CM

## 2013-10-29 DIAGNOSIS — D649 Anemia, unspecified: Secondary | ICD-10-CM

## 2013-10-29 LAB — CBC WITH DIFFERENTIAL/PLATELET
Basophils Absolute: 0 10*3/uL (ref 0.0–0.1)
Eosinophils Absolute: 0.1 10*3/uL (ref 0.0–0.5)
HCT: 34.3 % — ABNORMAL LOW (ref 38.4–49.9)
LYMPH%: 35.5 % (ref 14.0–49.0)
MCHC: 31.2 g/dL — ABNORMAL LOW (ref 32.0–36.0)
MONO#: 0.5 10*3/uL (ref 0.1–0.9)
NEUT%: 54.5 % (ref 39.0–75.0)
Platelets: 116 10*3/uL — ABNORMAL LOW (ref 140–400)
RDW: 16.7 % — ABNORMAL HIGH (ref 11.0–14.6)
WBC: 6.7 10*3/uL (ref 4.0–10.3)

## 2013-10-29 MED ORDER — TAMSULOSIN HCL 0.4 MG PO CAPS: ORAL_CAPSULE | ORAL | Status: DC

## 2013-10-29 NOTE — Telephone Encounter (Signed)
gv and printed appt sched and avs for pt for Dec thru March 2015  °

## 2013-10-29 NOTE — Progress Notes (Signed)
Hematology and Oncology Follow Up Visit  [name] [name] [id number] 15-Jul-1926 79 y.o. 10/29/2013 1:25 PM [name], [name], DOReed, [name] L, DO   Principle Diagnosis: 79 year old gentleman with anemia that is multifactorial likely due to chronic disease and renal insufficiency  Current therapy: Aranesp 300 mcg every 3 weeks for a hemoglobin less than 10  Interim History:  [name] [name] returns for routine followup with his wife. He is receiving Aranesp and tolerating well. He has noticed some improvement in terms of his fatigue. He denies chest pain, shortness of breath, dyspnea. He has not noticed any bleeding. Appetite is improving and weight is stable. Lower extremity edema has improved. He has not reported any recent illness or hospitalization. He is not reporting any shortness of breath or excessive fatigue.  Medications: I have reviewed the patient's current medications.  Current Outpatient Prescriptions  Medication Sig Dispense Refill  . amoxicillin (AMOXIL) 500 MG capsule Take 4 capsules (2,000 mg total) by mouth as needed. Before dental procedures.  4 capsule  6  . aspirin EC 325 MG tablet Take 325 mg by mouth every morning.       . carvedilol (COREG) 25 MG tablet Take 25 mg by mouth 2 (two) times daily with a meal.      . cholecalciferol (VITAMIN D) 1000 UNITS tablet Take 1,000 Units by mouth every morning.       . furosemide (LASIX) 40 MG tablet Take 2 tablets (80 mg total) by mouth daily.  60 tablet  6  . hydrALAZINE (APRESOLINE) 25 MG tablet Take 25 mg by mouth 3 (three) times daily.      . isosorbide mononitrate (IMDUR) 30 MG 24 hr tablet TAKE 1 TABLET BY MOUTH EVERY DAY  30 tablet  3  . Multiple Vitamin (MULTIVITAMIN) capsule Take 1 capsule by mouth every morning.       . nitroGLYCERIN (NITROSTAT) 0.4 MG SL tablet Place 1 tablet (0.4 mg total) under the tongue every 5 (five) minutes x 3 doses as needed. For chest pain  25 tablet  5  . omeprazole (PRILOSEC) 20 MG capsule Take 20  mg by mouth every morning.       . Prenatal Vit-Fe Fumarate-FA (MULTIVITAMIN-PRENATAL) 27-0.8 MG TABS Take 1 tablet by mouth every evening.      . tamsulosin (FLOMAX) 0.4 MG CAPS capsule Take one capsule by mouth daily after supper.  30 capsule  5  . traMADol (ULTRAM) 50 MG tablet Take 0.5 tablets (25 mg total) by mouth every 6 (six) hours as needed for pain.  30 tablet  0  . Travoprost, BAK Free, (TRAVATAN) 0.004 % SOLN ophthalmic solution Place 1 drop into both eyes daily.       . [DISCONTINUED] metFORMIN (GLUCOPHAGE) 500 MG tablet Take 500-1,000 mg by mouth. 2 tabs in the am, 1 tab in the pm       No current facility-administered medications for this visit.    Allergies:  Allergies  Allergen Reactions  . Statins     Pain/weakness in legs    Past Medical History, Surgical history, Social history, and Family History were reviewed and updated.  Review of Systems: Constitutional:  Negative for fever, chills, night sweats, anorexia, weight loss, pain. Cardiovascular: no chest pain or dyspnea on exertion Respiratory: no cough, shortness of breath, or wheezing Neurological: no TIA or stroke symptoms Dermatological: negative ENT: negative Skin: Negative. Gastrointestinal: no abdominal pain, change in bowel habits, or black or bloody stools Genito-Urinary: no dysuria, trouble voiding, or hematuria  Hematological and Lymphatic: negative Breast: negative for breast lumps Musculoskeletal: negative Remaining ROS negative.  Physical Exam: Blood pressure 154/61, pulse 61, temperature 96.6 F (35.9 C), temperature source Oral, resp. rate 17, height 5\' 8"  (1.727 m), weight 142 lb 6.4 oz (64.592 kg). ECOG: 1 General appearance: alert, cooperative and no distress Head: Normocephalic, without obvious abnormality, atraumatic Neck: no adenopathy, no carotid bruit, no JVD, supple, symmetrical, trachea midline and thyroid not enlarged, symmetric, no tenderness/mass/nodules Lymph nodes: Cervical,  supraclavicular, and axillary nodes normal. Heart:regular rate and rhythm, S1, S2 normal, no murmur, click, rub or gallop Lung:chest clear, no wheezing, rales, normal symmetric air entry, no tachypnea, retractions or cyanosis Abdomen: soft, non-tender, without masses or organomegaly EXT:no erythema, induration, or nodules   Lab Results: Lab Results  Component Value Date   WBC 5.5 10/08/2013   HGB 10.6* 10/08/2013   HCT 32.6* 10/08/2013   MCV 92.5 10/08/2013   PLT 95* 10/08/2013     Chemistry      Component Value Date/Time   NA 142 07/07/2013 1626   NA 133* 06/19/2013 0949   NA 140 09/04/2012 1309   K 4.4 07/07/2013 1626   K 4.7 09/04/2012 1309   CL 98 07/07/2013 1626   CL 101 09/04/2012 1309   CO2 31* 07/07/2013 1626   CO2 31* 09/04/2012 1309   BUN 39* 07/07/2013 1626   BUN 44* 06/19/2013 0949   BUN 41.0* 09/04/2012 1309   CREATININE 2.20* 07/07/2013 1626   CREATININE 2.21* 01/08/2013 1152   CREATININE 2.2* 09/04/2012 1309      Component Value Date/Time   CALCIUM 9.4 07/07/2013 1626   CALCIUM 9.3 09/04/2012 1309   ALKPHOS 87 07/07/2013 1626   ALKPHOS 73 09/04/2012 1309   AST 13 07/07/2013 1626   AST 18 09/04/2012 1309   ALT 12 07/07/2013 1626   ALT 21 09/04/2012 1309   BILITOT 0.3 07/07/2013 1626   BILITOT 0.40 09/04/2012 1309     Impression and Plan: This is an 79 year old gentleman with the following issues: 1. Anemia, multifactorial. Likely due to chronic disease and renal insufficiency. Hemoglobin is 10.6 today. Aranesp will not be given today. I recommend that he continue lab check every 3 weeks and Aranesp if Hgb <10.0. 2. Thrombocytopenia, mild. We will continue to watch this with future lab draws. He has no active bleeding. 3. Hypertension. Blood pressure is controlled with hydralazine, Coreg, and Lasix. 4. Coronary artery disease. He remains on aspirin, Coreg, and Imdur. 5. Followup. The patient will continue to have a lab and Aranesp injection every 3 weeks. Visit in 02/2014.       [name] [name] 11/19/20141:25 PM

## 2013-10-30 ENCOUNTER — Other Ambulatory Visit: Payer: Self-pay

## 2013-10-30 ENCOUNTER — Other Ambulatory Visit: Payer: Self-pay | Admitting: *Deleted

## 2013-10-30 ENCOUNTER — Other Ambulatory Visit (HOSPITAL_COMMUNITY): Payer: Self-pay | Admitting: *Deleted

## 2013-10-30 MED ORDER — ISOSORBIDE MONONITRATE ER 30 MG PO TB24: ORAL_TABLET | ORAL | Status: DC

## 2013-10-30 MED ORDER — CARVEDILOL 25 MG PO TABS: 25.0000 mg | ORAL_TABLET | Freq: Two times a day (BID) | ORAL | Status: DC

## 2013-10-30 MED ORDER — HYDRALAZINE HCL 25 MG PO TABS: 25.0000 mg | ORAL_TABLET | Freq: Three times a day (TID) | ORAL | Status: DC

## 2013-10-30 NOTE — Telephone Encounter (Signed)
[address] pt. 

## 2013-10-30 NOTE — Telephone Encounter (Signed)
FAXED RX FROM [facility] [phone] F [phone] FUROSEMIDE 40 MG #90

## 2013-11-04 ENCOUNTER — Other Ambulatory Visit: Payer: Self-pay

## 2013-11-04 MED ORDER — FUROSEMIDE 40 MG PO TABS: 80.0000 mg | ORAL_TABLET | Freq: Every day | ORAL | Status: DC

## 2013-11-19 ENCOUNTER — Other Ambulatory Visit (HOSPITAL_BASED_OUTPATIENT_CLINIC_OR_DEPARTMENT_OTHER): Payer: Medicare Other

## 2013-11-19 ENCOUNTER — Ambulatory Visit: Payer: Medicare Other

## 2013-11-19 DIAGNOSIS — D649 Anemia, unspecified: Secondary | ICD-10-CM

## 2013-11-19 DIAGNOSIS — N179 Acute kidney failure, unspecified: Secondary | ICD-10-CM

## 2013-11-19 LAB — CBC WITH DIFFERENTIAL/PLATELET
BASO%: 0 % (ref 0.0–2.0)
EOS%: 2.1 % (ref 0.0–7.0)
Eosinophils Absolute: 0.1 10*3/uL (ref 0.0–0.5)
HCT: 33 % — ABNORMAL LOW (ref 38.4–49.9)
LYMPH%: 38.2 % (ref 14.0–49.0)
MCHC: 31.8 g/dL — ABNORMAL LOW (ref 32.0–36.0)
MCV: 99.4 fL — ABNORMAL HIGH (ref 79.3–98.0)
MONO%: 6 % (ref 0.0–14.0)
NEUT#: 3.3 10*3/uL (ref 1.5–6.5)
Platelets: 109 10*3/uL — ABNORMAL LOW (ref 140–400)
RBC: 3.32 10*6/uL — ABNORMAL LOW (ref 4.20–5.82)
RDW: 15.3 % — ABNORMAL HIGH (ref 11.0–14.6)
WBC: 6.1 10*3/uL (ref 4.0–10.3)
nRBC: 0 % (ref 0–0)

## 2013-11-19 MED ORDER — DARBEPOETIN ALFA-POLYSORBATE 300 MCG/0.6ML IJ SOLN: 300.0000 ug | Freq: Once | INTRAMUSCULAR | Status: DC

## 2013-12-09 ENCOUNTER — Other Ambulatory Visit: Payer: Self-pay | Admitting: Oncology

## 2013-12-10 ENCOUNTER — Other Ambulatory Visit (HOSPITAL_BASED_OUTPATIENT_CLINIC_OR_DEPARTMENT_OTHER): Payer: Medicare Other

## 2013-12-10 ENCOUNTER — Ambulatory Visit: Payer: Medicare Other

## 2013-12-10 DIAGNOSIS — D649 Anemia, unspecified: Secondary | ICD-10-CM

## 2013-12-10 DIAGNOSIS — N179 Acute kidney failure, unspecified: Secondary | ICD-10-CM

## 2013-12-10 DIAGNOSIS — N189 Chronic kidney disease, unspecified: Secondary | ICD-10-CM

## 2013-12-10 LAB — CBC WITH DIFFERENTIAL/PLATELET
BASO%: 0.3 % (ref 0.0–2.0)
EOS%: 3.7 % (ref 0.0–7.0)
MCH: 32.8 pg (ref 27.2–33.4)
MCHC: 33.2 g/dL (ref 32.0–36.0)
MONO#: 0.5 10*3/uL (ref 0.1–0.9)
MONO%: 8.8 % (ref 0.0–14.0)
NEUT%: 50 % (ref 39.0–75.0)
RBC: 3.07 10*6/uL — ABNORMAL LOW (ref 4.20–5.82)
RDW: 14.1 % (ref 11.0–14.6)
WBC: 5.2 10*3/uL (ref 4.0–10.3)
lymph#: 1.9 10*3/uL (ref 0.9–3.3)

## 2013-12-10 MED ORDER — DARBEPOETIN ALFA-POLYSORBATE 300 MCG/0.6ML IJ SOLN
300.0000 ug | Freq: Once | INTRAMUSCULAR | Status: DC
  Filled 2013-12-10: qty 0.6

## 2013-12-31 ENCOUNTER — Ambulatory Visit: Payer: Medicare Other

## 2013-12-31 ENCOUNTER — Other Ambulatory Visit (HOSPITAL_BASED_OUTPATIENT_CLINIC_OR_DEPARTMENT_OTHER): Payer: Medicare Other

## 2013-12-31 DIAGNOSIS — N179 Acute kidney failure, unspecified: Secondary | ICD-10-CM

## 2013-12-31 DIAGNOSIS — D649 Anemia, unspecified: Secondary | ICD-10-CM

## 2013-12-31 DIAGNOSIS — N189 Chronic kidney disease, unspecified: Principal | ICD-10-CM

## 2013-12-31 LAB — CBC WITH DIFFERENTIAL/PLATELET
BASO%: 0.4 % (ref 0.0–2.0)
Basophils Absolute: 0 10*3/uL (ref 0.0–0.1)
EOS ABS: 0.1 10*3/uL (ref 0.0–0.5)
EOS%: 2.1 % (ref 0.0–7.0)
HCT: 32.5 % — ABNORMAL LOW (ref 38.4–49.9)
HGB: 10.3 g/dL — ABNORMAL LOW (ref 13.0–17.1)
LYMPH%: 27.7 % (ref 14.0–49.0)
MCH: 31.8 pg (ref 27.2–33.4)
MCHC: 31.7 g/dL — AB (ref 32.0–36.0)
MCV: 100.3 fL — AB (ref 79.3–98.0)
MONO#: 0.4 10*3/uL (ref 0.1–0.9)
MONO%: 6.6 % (ref 0.0–14.0)
NEUT#: 3.6 10*3/uL (ref 1.5–6.5)
NEUT%: 63.2 % (ref 39.0–75.0)
PLATELETS: 118 10*3/uL — AB (ref 140–400)
RBC: 3.24 10*6/uL — ABNORMAL LOW (ref 4.20–5.82)
RDW: 14.7 % — ABNORMAL HIGH (ref 11.0–14.6)
WBC: 5.6 10*3/uL (ref 4.0–10.3)
lymph#: 1.6 10*3/uL (ref 0.9–3.3)

## 2013-12-31 MED ORDER — DARBEPOETIN ALFA-POLYSORBATE 300 MCG/0.6ML IJ SOLN
300.0000 ug | Freq: Once | INTRAMUSCULAR | Status: DC
  Filled 2013-12-31: qty 0.6

## 2014-01-11 ENCOUNTER — Other Ambulatory Visit: Payer: Self-pay | Admitting: Cardiology

## 2014-01-14 ENCOUNTER — Other Ambulatory Visit: Payer: Self-pay | Admitting: Internal Medicine

## 2014-01-21 ENCOUNTER — Ambulatory Visit: Payer: Medicare Other

## 2014-01-21 ENCOUNTER — Other Ambulatory Visit (HOSPITAL_BASED_OUTPATIENT_CLINIC_OR_DEPARTMENT_OTHER): Payer: Medicare Other

## 2014-01-21 DIAGNOSIS — D649 Anemia, unspecified: Secondary | ICD-10-CM

## 2014-01-21 DIAGNOSIS — N189 Chronic kidney disease, unspecified: Principal | ICD-10-CM

## 2014-01-21 DIAGNOSIS — N179 Acute kidney failure, unspecified: Secondary | ICD-10-CM

## 2014-01-21 LAB — CBC WITH DIFFERENTIAL/PLATELET
BASO%: 0.2 % (ref 0.0–2.0)
Basophils Absolute: 0 10*3/uL (ref 0.0–0.1)
EOS%: 1.6 % (ref 0.0–7.0)
Eosinophils Absolute: 0.1 10*3/uL (ref 0.0–0.5)
HEMATOCRIT: 35.5 % — AB (ref 38.4–49.9)
HGB: 11.2 g/dL — ABNORMAL LOW (ref 13.0–17.1)
LYMPH#: 2.1 10*3/uL (ref 0.9–3.3)
LYMPH%: 34.9 % (ref 14.0–49.0)
MCH: 31.9 pg (ref 27.2–33.4)
MCHC: 31.5 g/dL — AB (ref 32.0–36.0)
MCV: 101.1 fL — ABNORMAL HIGH (ref 79.3–98.0)
MONO#: 0.4 10*3/uL (ref 0.1–0.9)
MONO%: 6.1 % (ref 0.0–14.0)
NEUT#: 3.5 10*3/uL (ref 1.5–6.5)
NEUT%: 57.2 % (ref 39.0–75.0)
Platelets: 125 10*3/uL — ABNORMAL LOW (ref 140–400)
RBC: 3.51 10*6/uL — ABNORMAL LOW (ref 4.20–5.82)
RDW: 14.5 % (ref 11.0–14.6)
WBC: 6.1 10*3/uL (ref 4.0–10.3)

## 2014-01-21 MED ORDER — DARBEPOETIN ALFA-POLYSORBATE 300 MCG/0.6ML IJ SOLN: 300.0000 ug | Freq: Once | INTRAMUSCULAR | Status: DC

## 2014-02-09 DIAGNOSIS — H4011X0 Primary open-angle glaucoma, stage unspecified: Secondary | ICD-10-CM | POA: Diagnosis not present

## 2014-02-09 DIAGNOSIS — H409 Unspecified glaucoma: Secondary | ICD-10-CM | POA: Diagnosis not present

## 2014-02-11 ENCOUNTER — Other Ambulatory Visit (HOSPITAL_BASED_OUTPATIENT_CLINIC_OR_DEPARTMENT_OTHER): Payer: Medicare Other

## 2014-02-11 ENCOUNTER — Ambulatory Visit: Payer: Medicare Other

## 2014-02-11 DIAGNOSIS — N189 Chronic kidney disease, unspecified: Principal | ICD-10-CM

## 2014-02-11 DIAGNOSIS — N179 Acute kidney failure, unspecified: Secondary | ICD-10-CM

## 2014-02-11 DIAGNOSIS — D649 Anemia, unspecified: Secondary | ICD-10-CM | POA: Diagnosis not present

## 2014-02-11 LAB — CBC WITH DIFFERENTIAL/PLATELET
BASO%: 0.4 % (ref 0.0–2.0)
BASOS ABS: 0 10*3/uL (ref 0.0–0.1)
EOS%: 1.9 % (ref 0.0–7.0)
Eosinophils Absolute: 0.1 10*3/uL (ref 0.0–0.5)
HEMATOCRIT: 35.3 % — AB (ref 38.4–49.9)
HEMOGLOBIN: 11.3 g/dL — AB (ref 13.0–17.1)
LYMPH#: 1.9 10*3/uL (ref 0.9–3.3)
LYMPH%: 31.4 % (ref 14.0–49.0)
MCH: 31.7 pg (ref 27.2–33.4)
MCHC: 31.9 g/dL — ABNORMAL LOW (ref 32.0–36.0)
MCV: 99.1 fL — ABNORMAL HIGH (ref 79.3–98.0)
MONO#: 0.4 10*3/uL (ref 0.1–0.9)
MONO%: 7.3 % (ref 0.0–14.0)
NEUT#: 3.5 10*3/uL (ref 1.5–6.5)
NEUT%: 59 % (ref 39.0–75.0)
Platelets: 117 10*3/uL — ABNORMAL LOW (ref 140–400)
RBC: 3.56 10*6/uL — ABNORMAL LOW (ref 4.20–5.82)
RDW: 14.5 % (ref 11.0–14.6)
WBC: 6 10*3/uL (ref 4.0–10.3)

## 2014-02-11 MED ORDER — DARBEPOETIN ALFA-POLYSORBATE 300 MCG/0.6ML IJ SOLN: 300.0000 ug | Freq: Once | INTRAMUSCULAR | Status: DC

## 2014-02-17 ENCOUNTER — Other Ambulatory Visit: Payer: Self-pay | Admitting: Internal Medicine

## 2014-03-03 ENCOUNTER — Other Ambulatory Visit: Payer: Self-pay | Admitting: Oncology

## 2014-03-04 ENCOUNTER — Other Ambulatory Visit (HOSPITAL_BASED_OUTPATIENT_CLINIC_OR_DEPARTMENT_OTHER): Payer: Medicare Other

## 2014-03-04 ENCOUNTER — Encounter: Payer: Self-pay | Admitting: Oncology

## 2014-03-04 ENCOUNTER — Encounter (HOSPITAL_COMMUNITY): Payer: Self-pay

## 2014-03-04 ENCOUNTER — Ambulatory Visit: Payer: Medicare Other

## 2014-03-04 ENCOUNTER — Ambulatory Visit (HOSPITAL_BASED_OUTPATIENT_CLINIC_OR_DEPARTMENT_OTHER): Payer: Medicare Other | Admitting: Oncology

## 2014-03-04 ENCOUNTER — Telehealth: Payer: Self-pay | Admitting: Oncology

## 2014-03-04 VITALS — BP 150/47 | HR 50 | Temp 97.2°F | Resp 18 | Ht 68.0 in | Wt 149.0 lb

## 2014-03-04 DIAGNOSIS — N189 Chronic kidney disease, unspecified: Principal | ICD-10-CM

## 2014-03-04 DIAGNOSIS — D649 Anemia, unspecified: Secondary | ICD-10-CM

## 2014-03-04 DIAGNOSIS — D696 Thrombocytopenia, unspecified: Secondary | ICD-10-CM | POA: Diagnosis not present

## 2014-03-04 DIAGNOSIS — N179 Acute kidney failure, unspecified: Secondary | ICD-10-CM

## 2014-03-04 LAB — CBC WITH DIFFERENTIAL/PLATELET
BASO%: 0.3 % (ref 0.0–2.0)
BASOS ABS: 0 10*3/uL (ref 0.0–0.1)
EOS%: 1.6 % (ref 0.0–7.0)
Eosinophils Absolute: 0.1 10*3/uL (ref 0.0–0.5)
HEMATOCRIT: 35.9 % — AB (ref 38.4–49.9)
HEMOGLOBIN: 11.8 g/dL — AB (ref 13.0–17.1)
LYMPH%: 41.5 % (ref 14.0–49.0)
MCH: 31.7 pg (ref 27.2–33.4)
MCHC: 32.9 g/dL (ref 32.0–36.0)
MCV: 96.5 fL (ref 79.3–98.0)
MONO#: 0.4 10*3/uL (ref 0.1–0.9)
MONO%: 7.1 % (ref 0.0–14.0)
NEUT#: 2.9 10*3/uL (ref 1.5–6.5)
NEUT%: 49.5 % (ref 39.0–75.0)
Platelets: 103 10*3/uL — ABNORMAL LOW (ref 140–400)
RBC: 3.72 10*6/uL — ABNORMAL LOW (ref 4.20–5.82)
RDW: 14 % (ref 11.0–14.6)
WBC: 5.8 10*3/uL (ref 4.0–10.3)
lymph#: 2.4 10*3/uL (ref 0.9–3.3)
nRBC: 0 % (ref 0–0)

## 2014-03-04 MED ORDER — DARBEPOETIN ALFA-POLYSORBATE 300 MCG/0.6ML IJ SOLN: 300.0000 ug | Freq: Once | INTRAMUSCULAR | Status: DC

## 2014-03-04 NOTE — Telephone Encounter (Signed)
, °

## 2014-03-04 NOTE — Progress Notes (Signed)
Hematology and Oncology Follow Up Visit  [name] [name] [id number] 1926-06-25 80 y.o. 03/04/2014 3:16 PM [name], [name], DOReed, [name] L, DO   Principle Diagnosis: 80 year old gentleman with anemia that is multifactorial likely due to chronic disease and renal insufficiency  Current therapy: Aranesp 300 mcg every 3 to 4  weeks for a hemoglobin less than 10  Interim History:  [name] [name] returns for routine followup with his wife. He is receiving Aranesp and tolerating well. He has not received any injection for the last few months but he does notice some improvement in terms of his fatigue. He denies chest pain, shortness of breath, dyspnea. He has not noticed any bleeding. Appetite is improving and weight is stable. Lower extremity edema has improved. He has not reported any recent illness or hospitalization. He is not reporting any shortness of breath or excessive fatigue. He continues to perform activities of daily living without any hindrance or decline. He is able to drive and work outside his house without any major issues.  Medications: I have reviewed the patient's current medications.  Current Outpatient Prescriptions  Medication Sig Dispense Refill  . amoxicillin (AMOXIL) 500 MG capsule Take 4 capsules (2,000 mg total) by mouth as needed. Before dental procedures.  4 capsule  6  . aspirin EC 325 MG tablet Take 325 mg by mouth every morning.       . carvedilol (COREG) 25 MG tablet Take 1 tablet (25 mg total) by mouth 2 (two) times daily with a meal.  180 tablet  3  . cholecalciferol (VITAMIN D) 1000 UNITS tablet Take 1,000 Units by mouth every morning.       . furosemide (LASIX) 40 MG tablet Take 2 tablets (80 mg total) by mouth daily.  180 tablet  2  . hydrALAZINE (APRESOLINE) 25 MG tablet Take 1 tablet (25 mg total) by mouth 3 (three) times daily.  270 tablet  2  . isosorbide mononitrate (IMDUR) 60 MG 24 hr tablet Take 1 tablet (60 mg total) by mouth daily.  30 tablet  6  .  Multiple Vitamin (MULTIVITAMIN) capsule Take 1 capsule by mouth every morning.       . nitroGLYCERIN (NITROSTAT) 0.4 MG SL tablet Place 1 tablet (0.4 mg total) under the tongue every 5 (five) minutes x 3 doses as needed. For chest pain  25 tablet  5  . omeprazole (PRILOSEC) 20 MG capsule Take 20 mg by mouth every morning.       . Prenatal Vit-Fe Fumarate-FA (MULTIVITAMIN-PRENATAL) 27-0.8 MG TABS Take 1 tablet by mouth every evening.      . tamsulosin (FLOMAX) 0.4 MG CAPS capsule Take one capsule by mouth daily after supper.  30 capsule  5  . Travoprost, BAK Free, (TRAVATAN) 0.004 % SOLN ophthalmic solution Place 1 drop into both eyes daily.       . [DISCONTINUED] metFORMIN (GLUCOPHAGE) 500 MG tablet Take 500-1,000 mg by mouth. 2 tabs in the am, 1 tab in the pm       No current facility-administered medications for this visit.    Allergies:  Allergies  Allergen Reactions  . Statins     Pain/weakness in legs    Past Medical History, Surgical history, Social history, and Family History were reviewed and updated.  Review of Systems: Constitutional:  Negative for fever, chills, night sweats, anorexia, weight loss, pain. Cardiovascular: no chest pain or dyspnea on exertion Respiratory: no cough, shortness of breath, or wheezing Neurological: no TIA or stroke symptoms Dermatological: negative  ENT: negative Skin: Negative. Gastrointestinal: no abdominal pain, change in bowel habits, or black or bloody stools Genito-Urinary: no dysuria, trouble voiding, or hematuria Hematological and Lymphatic: negative Musculoskeletal: negative Remaining ROS negative.  Physical Exam: Blood pressure 150/47, pulse 50, temperature 97.2 F (36.2 C), temperature source Oral, resp. rate 18, height 5\' 8"  (1.727 m), weight 149 lb (67.586 kg), SpO2 100.00%. ECOG: 1 General appearance: alert, cooperative and no distress appeared younger than stated age. Head: Normocephalic, without obvious abnormality,  atraumatic Neck: no adenopathy, no carotid bruit, no JVD, supple, symmetrical, trachea midline and thyroid not enlarged, symmetric, no tenderness/mass/nodules Lymph nodes: Cervical, supraclavicular, and axillary nodes normal. Heart:regular rate and rhythm, S1, S2 normal, no murmur, click, rub or gallop Lung:chest clear, no wheezing, rales, normal symmetric air entry, no tachypnea, retractions or cyanosis Abdomen: soft, non-tender, without masses or organomegaly EXT:no erythema, induration, or nodules   Lab Results: Lab Results  Component Value Date   WBC 5.8 03/04/2014   HGB 11.8* 03/04/2014   HCT 35.9* 03/04/2014   MCV 96.5 03/04/2014   PLT 103* 03/04/2014     Chemistry      Component Value Date/Time   NA 142 07/07/2013 1626   NA 133* 06/19/2013 0949   NA 140 09/04/2012 1309   K 4.4 07/07/2013 1626   K 4.7 09/04/2012 1309   CL 98 07/07/2013 1626   CL 101 09/04/2012 1309   CO2 31* 07/07/2013 1626   CO2 31* 09/04/2012 1309   BUN 39* 07/07/2013 1626   BUN 44* 06/19/2013 0949   BUN 41.0* 09/04/2012 1309   CREATININE 2.20* 07/07/2013 1626   CREATININE 2.21* 01/08/2013 1152   CREATININE 2.2* 09/04/2012 1309      Component Value Date/Time   CALCIUM 9.4 07/07/2013 1626   CALCIUM 9.3 09/04/2012 1309   ALKPHOS 87 07/07/2013 1626   ALKPHOS 73 09/04/2012 1309   AST 13 07/07/2013 1626   AST 18 09/04/2012 1309   ALT 12 07/07/2013 1626   ALT 21 09/04/2012 1309   BILITOT 0.3 07/07/2013 1626   BILITOT 0.40 09/04/2012 1309     Impression and Plan: This is an 80 year old gentleman with the following issues: 1. Anemia, multifactorial. Likely due to chronic disease and renal insufficiency. Hemoglobin is 11.8 today. Aranesp will not be given today. I recommend that he continue lab check every 4 weeks and Aranesp if Hgb <10.0. 2. Thrombocytopenia, mild. We will continue to watch this with future lab draws. He has no active bleeding. 3. Hypertension. Blood pressure is controlled with hydralazine, Coreg, and  Lasix. 4. Coronary artery disease. He remains on aspirin, Coreg, and Imdur. 5. Followup. You will be evaluated for a clinical visit in 6 months well monthly with labs and possible injection.     [facility] 3/25/20153:16 PM

## 2014-03-12 ENCOUNTER — Other Ambulatory Visit: Payer: Self-pay | Admitting: Internal Medicine

## 2014-03-19 ENCOUNTER — Ambulatory Visit (HOSPITAL_COMMUNITY)
Admission: RE | Admit: 2014-03-19 | Discharge: 2014-03-19 | Disposition: A | Discharge status: Home / Self Care | Payer: Medicare Other | Source: Ambulatory Visit | Attending: Internal Medicine | Admitting: Internal Medicine

## 2014-03-19 ENCOUNTER — Encounter (HOSPITAL_COMMUNITY): Payer: Self-pay

## 2014-03-19 VITALS — BP 132/54 | HR 51 | Wt 148.0 lb

## 2014-03-19 DIAGNOSIS — E785 Hyperlipidemia, unspecified: Secondary | ICD-10-CM | POA: Diagnosis not present

## 2014-03-19 DIAGNOSIS — I2510 Atherosclerotic heart disease of native coronary artery without angina pectoris: Secondary | ICD-10-CM | POA: Diagnosis not present

## 2014-03-19 DIAGNOSIS — R0989 Other specified symptoms and signs involving the circulatory and respiratory systems: Secondary | ICD-10-CM | POA: Diagnosis not present

## 2014-03-19 DIAGNOSIS — I509 Heart failure, unspecified: Secondary | ICD-10-CM | POA: Diagnosis not present

## 2014-03-19 DIAGNOSIS — I2589 Other forms of chronic ischemic heart disease: Secondary | ICD-10-CM | POA: Insufficient documentation

## 2014-03-19 DIAGNOSIS — Z951 Presence of aortocoronary bypass graft: Secondary | ICD-10-CM | POA: Diagnosis not present

## 2014-03-19 DIAGNOSIS — I5022 Chronic systolic (congestive) heart failure: Secondary | ICD-10-CM | POA: Diagnosis not present

## 2014-03-19 DIAGNOSIS — Z952 Presence of prosthetic heart valve: Secondary | ICD-10-CM

## 2014-03-19 DIAGNOSIS — N189 Chronic kidney disease, unspecified: Secondary | ICD-10-CM | POA: Diagnosis not present

## 2014-03-19 DIAGNOSIS — Z954 Presence of other heart-valve replacement: Secondary | ICD-10-CM

## 2014-03-19 LAB — BASIC METABOLIC PANEL
BUN: 48 mg/dL — ABNORMAL HIGH (ref 6–23)
CALCIUM: 9.7 mg/dL (ref 8.4–10.5)
CO2: 28 mEq/L (ref 19–32)
Chloride: 100 mEq/L (ref 96–112)
Creatinine, Ser: 2.16 mg/dL — ABNORMAL HIGH (ref 0.50–1.35)
GFR calc Af Amer: 30 mL/min — ABNORMAL LOW (ref >90)
GFR, EST NON AFRICAN AMERICAN: 26 mL/min — AB (ref >90)
GLUCOSE: 138 mg/dL — AB (ref 70–99)
POTASSIUM: 4.4 meq/L (ref 3.7–5.3)
SODIUM: 142 meq/L (ref 137–147)

## 2014-03-19 LAB — LIPID PANEL
CHOLESTEROL: 137 mg/dL (ref 0–200)
HDL: 36 mg/dL — ABNORMAL LOW (ref >39)
LDL Cholesterol: 78 mg/dL (ref 0–99)
TRIGLYCERIDES: 113 mg/dL (ref <150)
Total CHOL/HDL Ratio: 3.8 RATIO
VLDL: 23 mg/dL (ref 0–40)

## 2014-03-19 LAB — PRO B NATRIURETIC PEPTIDE: PRO B NATRI PEPTIDE: 13615 pg/mL — AB (ref 0–450)

## 2014-03-19 NOTE — Patient Instructions (Signed)
Labs today  Your physician has requested that you have a carotid duplex. This test is an ultrasound of the carotid arteries in your neck. It looks at blood flow through these arteries that supply the brain with blood. Allow one hour for this exam. There are no restrictions or special instructions.  We will contact you in 6 months to schedule your next appointment.

## 2014-03-21 NOTE — Progress Notes (Signed)
Patient ID: Roxanne Orner, male   DOB: 09/26/1926, 80 y.o.   MRN: [id number] PCP: Hollace Kinnier  HPI: Mr Orozco is a 80 y.o. male with a PMHx of CAD, CABG, bioprosthetic AVR, anemia, CKD last creatinine 2.2, Chronic systolic heart failure EF 40%  03/2013 and sepis E-Coli. He is not on an ACE or ARB due renal failure.   Admitted to [facility] 4/28 through 04/15/13  with volume overload. Diuresed with IV lasix. He was not discharged on diuretics. Discharge weight 139 pounds.   He has been doing well recently.  He is very active.  He walks for exercise.  He can climb a flight of steps with no problems.  No significant exertional dyspnea.  No orthopnea/PND.  No chest pain.  He is off statins due to severe myalgias. Weight has been stable at home, 136-137 lbs.    Labs: 05/28/13 Creatinine 2.37 Potassium 4.3 7/14 Creatinine 2.2 3/15 HCT 35.9  Past Medical History  Diagnosis Date  . History of colon cancer     s/p colon resection  . Aortic stenosis     a. s/p AVR with 69mm Edwards pericardial valve 02/07/12 - post-op course complicated by pleural effusion requring thoracentesis/leg cellulitis 03/2012.  Marland Kitchen CAD (coronary artery disease)     a. s/p NSTEMI 12/2011:  LHC - Ostial left main 20%, ostial LAD 50%, mid 60-70%, ostial D1 40% and mid 40%, D2 70%, ostial circumflex occluded, ostial RCA 80-90%, LVEDP was 42. b.  s/p CABG x 3 at time of AVR (LiMA-LAD, SVG-2nd daigonal, SVG-PDA) 02/07/12 (post-op course noted above).  . Ischemic cardiomyopathy   . Chronic systolic heart failure     a. TEE 01/2012: EF 25-30%, diffuse hypokinesis. b. Not on ACEI due to renal insufficiency.;  c. follow up  echo 08/06/12: EF 25%, mod diast dysfxn, AVR ok, mild MR, mod LAE, mild RAE, mild to mod RV systolic dysfunction  . HLD (hyperlipidemia)   . GERD (gastroesophageal reflux disease)   . Chronic kidney disease   . Pleural effusion     a. post-operatively after AVR/CABG s/p thoracentesis 03/2012 yielding 1L serosanguinous fluid.   . Diabetes mellitus     borderline  . Blood transfusion     NO REACTION TO TRANSFUSION  . Cellulitis     a. RLE cellulitis 2 months post-operatively after AVR/CABG - serratia marcessans, tx with I&D/antibiotics  . Mitral regurgitation     Moderate by TEE 01/2012  . Flash pulmonary edema     Post-cath 12/2011, went into acute pulm edema requiring IV lasix and intubation  . Baker's cyst 07/23/12    Incidental finding of LE venous dopplers  . Cataract     right eye, hx of  . HTN (hypertension)     primary, Dr. Jose Persia  . Myocardial infarction 12/2011  . CHF (congestive heart failure)   . Stroke 10/01    RIght Leg weakness  . Cancer '90's    Colon  . Anemia     Current Outpatient Prescriptions  Medication Sig Dispense Refill  . amoxicillin (AMOXIL) 500 MG capsule Take 4 capsules (2,000 mg total) by mouth as needed. Before dental procedures.  4 capsule  6  . aspirin EC 325 MG tablet Take 325 mg by mouth every morning.       . carvedilol (COREG) 25 MG tablet Take 1 tablet (25 mg total) by mouth 2 (two) times daily with a meal.  180 tablet  3  . cholecalciferol (VITAMIN D) 1000 UNITS  tablet Take 1,000 Units by mouth every morning.       . furosemide (LASIX) 40 MG tablet Take 2 tablets (80 mg total) by mouth daily.  180 tablet  2  . hydrALAZINE (APRESOLINE) 25 MG tablet Take 1 tablet (25 mg total) by mouth 3 (three) times daily.  270 tablet  2  . hydrALAZINE (APRESOLINE) 25 MG tablet TAKE 1 TABLET BY MOUTH 3 TIMES DAILY  90 tablet  1  . isosorbide mononitrate (IMDUR) 60 MG 24 hr tablet Take 1 tablet (60 mg total) by mouth daily.  30 tablet  6  . Multiple Vitamin (MULTIVITAMIN) capsule Take 1 capsule by mouth every morning.       . nitroGLYCERIN (NITROSTAT) 0.4 MG SL tablet Place 1 tablet (0.4 mg total) under the tongue every 5 (five) minutes x 3 doses as needed. For chest pain  25 tablet  5  . omeprazole (PRILOSEC) 20 MG capsule Take 20 mg by mouth every morning.       . Prenatal  Vit-Fe Fumarate-FA (MULTIVITAMIN-PRENATAL) 27-0.8 MG TABS Take 1 tablet by mouth every evening.      . tamsulosin (FLOMAX) 0.4 MG CAPS capsule Take one capsule by mouth daily after supper.  30 capsule  5  . Travoprost, BAK Free, (TRAVATAN) 0.004 % SOLN ophthalmic solution Place 1 drop into both eyes daily.       . [DISCONTINUED] metFORMIN (GLUCOPHAGE) 500 MG tablet Take 500-1,000 mg by mouth. 2 tabs in the am, 1 tab in the pm       No current facility-administered medications for this encounter.     Allergies  Allergen Reactions  . Statins     Pain/weakness in legs    History   Social History  . Marital Status: Married    Spouse Name: N/A    Number of Children: N/A  . Years of Education: N/A   Occupational History  . Not on file.   Social History Main Topics  . Smoking status: Never Smoker   . Smokeless tobacco: Never Used  . Alcohol Use: No  . Drug Use: No  . Sexual Activity: Not Currently   Other Topics Concern  . Not on file   Social History Narrative  . No narrative on file    Family History  Problem Relation Age of Onset  . Cancer Mother   . Heart attack Father   . Mental illness Brother   . Kidney failure Brother     PHYSICAL EXAM: Filed Vitals:   03/19/14 1324  BP: 132/54  Pulse: 51   General:  Elderly chronically ill appearing. No respiratory difficulty Wife  present HEENT: normal Neck: supple. JVD 5-6. Carotids 2+ bilat; bilateral carotid bruits. No lymphadenopathy or thryomegaly appreciated. Cor: PMI nondisplaced. Regular rate & rhythm. No rubs, gallops.  2/6 SEM RUSB.  Lungs: clear Abdomen: soft, nontender, nondistended. No hepatosplenomegaly. No bruits or masses. Good bowel sounds. Extremities: no cyanosis, clubbing, rash, edema Neuro: alert & oriented x 3, cranial nerves grossly intact. moves all 4 extremities w/o difficulty. Affect pleasant.  ASSESSMENT & PLAN: 1. CAD: S/p CABG.  No ischemic symptoms. Continue ASA, Coreg.  He is not on a  statin due to myalgias.  2. Aortic valve replacement: Bioprosthetic.  Stable on last echo.  3. Chronic systolic CHF: Ischemic cardiomyopathy.  EF 40% on last echo.  Continue Coreg, hydralazine, and Imdur at current doses. Will check BMET/BNP today. Not on ACEI because of CKD.  4. CKD: BMET today.  5. Carotid stenosis: Will check carotid dopplers.   Followup 6 months.   Larey Dresser 03/21/2014

## 2014-03-23 ENCOUNTER — Ambulatory Visit (HOSPITAL_COMMUNITY)
Admission: RE | Admit: 2014-03-23 | Discharge: 2014-03-23 | Disposition: A | Discharge status: Home / Self Care | Payer: Medicare Other | Source: Ambulatory Visit | Attending: Cardiology | Admitting: Cardiology

## 2014-03-23 DIAGNOSIS — R0989 Other specified symptoms and signs involving the circulatory and respiratory systems: Secondary | ICD-10-CM | POA: Insufficient documentation

## 2014-03-23 NOTE — Progress Notes (Addendum)
Bilateral carotid artery duplex:  1-39% ICA stenosis.  Vertebral artery flow is antegrade.     

## 2014-03-26 ENCOUNTER — Encounter (HOSPITAL_COMMUNITY): Payer: Self-pay | Admitting: *Deleted

## 2014-04-01 ENCOUNTER — Other Ambulatory Visit (HOSPITAL_BASED_OUTPATIENT_CLINIC_OR_DEPARTMENT_OTHER): Payer: Medicare Other

## 2014-04-01 ENCOUNTER — Ambulatory Visit: Payer: Medicare Other

## 2014-04-01 DIAGNOSIS — D649 Anemia, unspecified: Secondary | ICD-10-CM

## 2014-04-01 DIAGNOSIS — N179 Acute kidney failure, unspecified: Secondary | ICD-10-CM

## 2014-04-01 DIAGNOSIS — N189 Chronic kidney disease, unspecified: Principal | ICD-10-CM

## 2014-04-01 LAB — CBC WITH DIFFERENTIAL/PLATELET
BASO%: 0.3 % (ref 0.0–2.0)
Basophils Absolute: 0 10*3/uL (ref 0.0–0.1)
EOS ABS: 0.1 10*3/uL (ref 0.0–0.5)
EOS%: 2.1 % (ref 0.0–7.0)
HEMATOCRIT: 34.3 % — AB (ref 38.4–49.9)
HGB: 11.1 g/dL — ABNORMAL LOW (ref 13.0–17.1)
LYMPH%: 32.2 % (ref 14.0–49.0)
MCH: 31.9 pg (ref 27.2–33.4)
MCHC: 32.5 g/dL (ref 32.0–36.0)
MCV: 98.2 fL — ABNORMAL HIGH (ref 79.3–98.0)
MONO#: 0.4 10*3/uL (ref 0.1–0.9)
MONO%: 7.2 % (ref 0.0–14.0)
NEUT%: 58.2 % (ref 39.0–75.0)
NEUTROS ABS: 3.4 10*3/uL (ref 1.5–6.5)
PLATELETS: 119 10*3/uL — AB (ref 140–400)
RBC: 3.5 10*6/uL — ABNORMAL LOW (ref 4.20–5.82)
RDW: 14.3 % (ref 11.0–14.6)
WBC: 5.8 10*3/uL (ref 4.0–10.3)
lymph#: 1.9 10*3/uL (ref 0.9–3.3)

## 2014-04-01 MED ORDER — DARBEPOETIN ALFA-POLYSORBATE 300 MCG/0.6ML IJ SOLN: 300.0000 ug | Freq: Once | INTRAMUSCULAR | Status: DC

## 2014-04-20 ENCOUNTER — Encounter: Payer: Medicare Other | Admitting: Internal Medicine

## 2014-04-24 ENCOUNTER — Telehealth: Payer: Self-pay | Admitting: Oncology

## 2014-04-24 NOTE — Telephone Encounter (Signed)
pt called to r/s appt done...adjust all tx after.Marland KitchenMarland Kitchenpt will pick up new sched at nxt visit

## 2014-04-29 ENCOUNTER — Other Ambulatory Visit: Payer: Medicare Other

## 2014-04-29 ENCOUNTER — Ambulatory Visit: Payer: Medicare Other

## 2014-05-05 ENCOUNTER — Telehealth: Payer: Self-pay | Admitting: Oncology

## 2014-05-05 NOTE — Telephone Encounter (Signed)
returned pt wife call and r/s appt....pt wants to stay on wednesdays....gv pt appt monthly appts.

## 2014-05-06 ENCOUNTER — Other Ambulatory Visit: Payer: Medicare Other

## 2014-05-06 ENCOUNTER — Ambulatory Visit: Payer: Medicare Other

## 2014-05-07 ENCOUNTER — Other Ambulatory Visit (HOSPITAL_BASED_OUTPATIENT_CLINIC_OR_DEPARTMENT_OTHER): Payer: Medicare Other

## 2014-05-07 ENCOUNTER — Ambulatory Visit: Payer: Medicare Other

## 2014-05-07 DIAGNOSIS — D649 Anemia, unspecified: Secondary | ICD-10-CM | POA: Diagnosis not present

## 2014-05-07 DIAGNOSIS — N179 Acute kidney failure, unspecified: Secondary | ICD-10-CM

## 2014-05-07 DIAGNOSIS — N189 Chronic kidney disease, unspecified: Principal | ICD-10-CM

## 2014-05-07 LAB — CBC WITH DIFFERENTIAL/PLATELET
BASO%: 0.2 % (ref 0.0–2.0)
Basophils Absolute: 0 10*3/uL (ref 0.0–0.1)
EOS%: 1.2 % (ref 0.0–7.0)
Eosinophils Absolute: 0.1 10*3/uL (ref 0.0–0.5)
HEMATOCRIT: 30.7 % — AB (ref 38.4–49.9)
HGB: 10 g/dL — ABNORMAL LOW (ref 13.0–17.1)
LYMPH%: 26.5 % (ref 14.0–49.0)
MCH: 32.1 pg (ref 27.2–33.4)
MCHC: 32.6 g/dL (ref 32.0–36.0)
MCV: 98.4 fL — ABNORMAL HIGH (ref 79.3–98.0)
MONO#: 0.3 10*3/uL (ref 0.1–0.9)
MONO%: 7.8 % (ref 0.0–14.0)
NEUT#: 2.6 10*3/uL (ref 1.5–6.5)
NEUT%: 64.3 % (ref 39.0–75.0)
Platelets: 108 10*3/uL — ABNORMAL LOW (ref 140–400)
RBC: 3.12 10*6/uL — ABNORMAL LOW (ref 4.20–5.82)
RDW: 14.8 % — ABNORMAL HIGH (ref 11.0–14.6)
WBC: 4.1 10*3/uL (ref 4.0–10.3)
lymph#: 1.1 10*3/uL (ref 0.9–3.3)

## 2014-05-07 MED ORDER — DARBEPOETIN ALFA-POLYSORBATE 300 MCG/0.6ML IJ SOLN: 300.0000 ug | Freq: Once | INTRAMUSCULAR | Status: DC

## 2014-05-12 ENCOUNTER — Other Ambulatory Visit: Payer: Self-pay | Admitting: Internal Medicine

## 2014-05-25 ENCOUNTER — Other Ambulatory Visit: Payer: Self-pay | Admitting: Cardiology

## 2014-05-27 ENCOUNTER — Ambulatory Visit: Payer: Medicare Other

## 2014-05-27 ENCOUNTER — Other Ambulatory Visit: Payer: Medicare Other

## 2014-05-28 ENCOUNTER — Encounter: Payer: Self-pay | Admitting: Internal Medicine

## 2014-05-28 ENCOUNTER — Ambulatory Visit (INDEPENDENT_AMBULATORY_CARE_PROVIDER_SITE_OTHER): Payer: Medicare Other | Admitting: Internal Medicine

## 2014-05-28 VITALS — BP 172/70 | HR 51 | Temp 98.2°F | Resp 18 | Ht 66.34 in | Wt 145.2 lb

## 2014-05-28 DIAGNOSIS — Z23 Encounter for immunization: Secondary | ICD-10-CM

## 2014-05-28 DIAGNOSIS — I5022 Chronic systolic (congestive) heart failure: Secondary | ICD-10-CM

## 2014-05-28 DIAGNOSIS — D518 Other vitamin B12 deficiency anemias: Secondary | ICD-10-CM | POA: Diagnosis not present

## 2014-05-28 DIAGNOSIS — I2510 Atherosclerotic heart disease of native coronary artery without angina pectoris: Secondary | ICD-10-CM

## 2014-05-28 DIAGNOSIS — N183 Chronic kidney disease, stage 3 unspecified: Secondary | ICD-10-CM

## 2014-05-28 DIAGNOSIS — D519 Vitamin B12 deficiency anemia, unspecified: Secondary | ICD-10-CM

## 2014-05-28 DIAGNOSIS — E119 Type 2 diabetes mellitus without complications: Secondary | ICD-10-CM

## 2014-05-28 DIAGNOSIS — I10 Essential (primary) hypertension: Secondary | ICD-10-CM

## 2014-05-28 MED ORDER — ZOSTER VACCINE LIVE 19400 UNT/0.65ML ~~LOC~~ SOLR: 0.6500 mL | Freq: Once | SUBCUTANEOUS | Status: DC

## 2014-05-28 NOTE — Progress Notes (Signed)
Patient ID: [name] [name], male   DOB: 12/07/1926, 80 y.o.   MRN: [id number]   Location:  [facility]  Code Status: DNR   Allergies  Allergen Reactions  . Statins     Pain/weakness in legs    Chief Complaint  Patient presents with  . Annual Exam    HPI: Patient is a 80 y.o. white male seen in the office today for his annual exam.    Next eye visit due next month. Doing great still exercising three times daily.  Gets one drop in each eye at night. Was extremely cold waiting today and shivering when I came into the room.  Appeared more dyspneic, but said it was only b/c of how cold he was.   He continues to walk daily also.   His bp readings and sugars were well controlled (brings detailed record--120s-130s, sugars 120s-130s, weight stable in mid 130s.    Review of Systems:  Review of Systems  Constitutional: Negative for fever and chills.  HENT: Negative for congestion.   Eyes: Negative for blurred vision.  Respiratory: Positive for shortness of breath. Negative for cough.   Cardiovascular: Negative for chest pain and leg swelling.  Gastrointestinal: Negative for abdominal pain.  Genitourinary: Positive for frequency. Negative for dysuria and urgency.       Uses urinal to avoid getting up at night.  Musculoskeletal: Negative for falls.  Skin: Negative for rash.  Neurological: Negative for dizziness, loss of consciousness and headaches.  Psychiatric/Behavioral: Negative for depression and memory loss.     Past Medical History  Diagnosis Date  . History of colon cancer     s/p colon resection  . Aortic stenosis     a. s/p AVR with 82mm Edwards pericardial valve 02/07/12 - post-op course complicated by pleural effusion requring thoracentesis/leg cellulitis 03/2012.  Marland Kitchen CAD (coronary artery disease)     a. s/p NSTEMI 12/2011:  LHC - Ostial left main 20%, ostial LAD 50%, mid 60-70%, ostial D1 40% and mid 40%, D2 70%, ostial circumflex  occluded, ostial RCA 80-90%, LVEDP was 42. b.  s/p CABG x 3 at time of AVR (LiMA-LAD, SVG-2nd daigonal, SVG-PDA) 02/07/12 (post-op course noted above).  . Ischemic cardiomyopathy   . Chronic systolic heart failure     a. TEE 01/2012: EF 25-30%, diffuse hypokinesis. b. Not on ACEI due to renal insufficiency.;  c. follow up  echo 08/06/12: EF 25%, mod diast dysfxn, AVR ok, mild MR, mod LAE, mild RAE, mild to mod RV systolic dysfunction  . HLD (hyperlipidemia)   . GERD (gastroesophageal reflux disease)   . Chronic kidney disease   . Pleural effusion     a. post-operatively after AVR/CABG s/p thoracentesis 03/2012 yielding 1L serosanguinous fluid.  . Diabetes mellitus     borderline  . Blood transfusion     NO REACTION TO TRANSFUSION  . Cellulitis     a. RLE cellulitis 2 months post-operatively after AVR/CABG - serratia marcessans, tx with I&D/antibiotics  . Mitral regurgitation     Moderate by TEE 01/2012  . Flash pulmonary edema     Post-cath 12/2011, went into acute pulm edema requiring IV lasix and intubation  . Baker's cyst 07/23/12    Incidental finding of LE venous dopplers  . Cataract     right eye, hx of  . HTN (hypertension)     primary, Dr. Jose Persia  . Myocardial infarction 12/2011  . CHF (congestive heart failure)   .  Stroke 10/01    RIght Leg weakness  . Cancer '90's    Colon  . Anemia     Past Surgical History  Procedure Laterality Date  . Colon resection  1996  . Esophageal dilation    . Colonoscopy    . Cataract extraction      rt  . Cardiac catheterization      1.3.13  stopped breathing, put on ventilator for 5-6 days  . Tonsillectomy    . Aortic valve replacement  02/07/2012    Procedure: AORTIC VALVE REPLACEMENT (AVR);  Surgeon: Gaye Pollack, MD;  Location: [place];  Service: Open Heart Surgery;  Laterality: N/A;  . Coronary artery bypass graft  02/07/2012    Procedure: CORONARY ARTERY BYPASS GRAFTING (CABG);  Surgeon: Gaye Pollack, MD;  Location: [place];   Service: Open Heart Surgery;  Laterality: N/A;  CABG x three; using right leg greater saphenous vein harvested endoscopically  . Chest tube insertion  07/24/2012    Procedure: INSERTION PLEURAL DRAINAGE CATHETER;  Surgeon: Gaye Pollack, MD;  Location: [place];  Service: Thoracic;  Laterality: Left;  . Talc pleurodesis  08/30/2012    Procedure: TALC PLEURADESIS;  Surgeon: Gaye Pollack, MD;  Location: [place];  Service: Thoracic;  Laterality: Left;  INSERTION OF TALC VIA LEFT PLEURX  . Talc pleurodesis  09/12/2012    Procedure: TALC PLEURADESIS;  Surgeon: Gaye Pollack, MD;  Location: [place];  Service: Thoracic;  Laterality: Left;  . Removal of pleural drainage catheter  09/27/2012    Procedure: REMOVAL OF PLEURAL DRAINAGE CATHETER;  Surgeon: Gaye Pollack, MD;  Location: [place];  Service: Thoracic;  Laterality: Left;  Marland Kitchen Eye surgery  2009    cataract removed from right eye    Social History:   reports that he has never smoked. He has never used smokeless tobacco. He reports that he does not drink alcohol or use illicit drugs.  Family History  Problem Relation Age of Onset  . Cancer Mother   . Heart attack Father   . Mental illness Brother   . Kidney failure Brother     Medications: Patient's Medications  New Prescriptions   No medications on file  Previous Medications   AMOXICILLIN (AMOXIL) 500 MG CAPSULE    Take 4 capsules (2,000 mg total) by mouth as needed. Before dental procedures.   ASPIRIN EC 325 MG TABLET    Take 325 mg by mouth every morning.    CARVEDILOL (COREG) 25 MG TABLET    Take 1 tablet (25 mg total) by mouth 2 (two) times daily with a meal.   CHOLECALCIFEROL (VITAMIN D) 1000 UNITS TABLET    Take 1,000 Units by mouth every morning.    FUROSEMIDE (LASIX) 40 MG TABLET    Take 2 tablets (80 mg total) by mouth daily.   HYDRALAZINE (APRESOLINE) 25 MG TABLET    Take 1 tablet (25 mg total) by mouth 3 (three) times daily.   HYDRALAZINE (APRESOLINE) 25 MG TABLET    TAKE 1 TABLET  BY MOUTH 3 TIMES DAILY   ISOSORBIDE MONONITRATE (IMDUR) 60 MG 24 HR TABLET    Take 1 tablet (60 mg total) by mouth daily.   MULTIPLE VITAMIN (MULTIVITAMIN) CAPSULE    Take 1 capsule by mouth every morning.    NITROSTAT 0.4 MG SL TABLET    PLACE 1 TABLET UNDER TONGUE EVERY 5 MINUTES FOR 3 DOSES AS NEEDED   OMEPRAZOLE (PRILOSEC) 20 MG CAPSULE  Take 20 mg by mouth every morning.    PRENATAL VIT-FE FUMARATE-FA (MULTIVITAMIN-PRENATAL) 27-0.8 MG TABS    Take 1 tablet by mouth every evening.   TAMSULOSIN (FLOMAX) 0.4 MG CAPS CAPSULE    Take one capsule by mouth daily after supper.   TRAVOPROST, BAK FREE, (TRAVATAN) 0.004 % SOLN OPHTHALMIC SOLUTION    Place 1 drop into both eyes daily.   Modified Medications   No medications on file  Discontinued Medications   No medications on file     Physical Exam: Filed Vitals:   05/28/14 1345  BP: 172/70  Pulse: 51  Temp: 98.2 F (36.8 C)  TempSrc: Oral  Resp: 18  Height: 5' 6.34" (1.685 m)  Weight: 145 lb 3.2 oz (65.862 kg)  SpO2: 95%  Physical Exam  Constitutional: He is oriented to person, place, and time. He appears well-developed and well-nourished. No distress.  HENT:  Head: Normocephalic and atraumatic.  Right Ear: External ear normal.  Left Ear: External ear normal.  Nose: Nose normal.  Mouth/Throat: Oropharynx is clear and moist. No oropharyngeal exudate.  Eyes: Conjunctivae and EOM are normal. Pupils are equal, round, and reactive to light.  Neck: Normal range of motion. Neck supple. No JVD present.  Cardiovascular: Normal rate, regular rhythm, normal heart sounds and intact distal pulses.   irreg irreg  Pulmonary/Chest: Effort normal and breath sounds normal. No respiratory distress.  Abdominal: Soft. Bowel sounds are normal. He exhibits no distension and no mass. There is no tenderness.  Musculoskeletal: Normal range of motion. He exhibits no edema and no tenderness.  Uses brace on right leg and walks with limp  Neurological: He  is alert and oriented to person, place, and time. He has normal reflexes. No cranial nerve deficit.  Skin: Skin is warm and dry.  onychomycotic toenails  Psychiatric: He has a normal mood and affect.   Labs reviewed: Basic Metabolic Panel:  Recent Labs  06/19/13 0949 07/07/13 1626 03/19/14 1345  NA 133* 142 142  K 3.7 4.4 4.4  CL 97 98 100  CO2 37* 31* 28  GLUCOSE 251* 135* 138*  BUN 44* 39* 48*  CREATININE 2.3* 2.20* 2.16*  CALCIUM 8.9 9.4 9.7   Liver Function Tests:  Recent Labs  07/07/13 1626  AST 13  ALT 12  ALKPHOS 87  BILITOT 0.3  PROT 7.2  CBC:  Recent Labs  03/04/14 1446 04/01/14 1256 05/07/14 1231  WBC 5.8 5.8 4.1  NEUTROABS 2.9 3.4 2.6  HGB 11.8* 11.1* 10.0*  HCT 35.9* 34.3* 30.7*  MCV 96.5 98.2* 98.4*  PLT 103* 119* 108*   Lipid Panel:  Recent Labs  03/19/14 1345  CHOL 137  HDL 36*  LDLCALC 78  TRIG 113  CHOLHDL 3.8   Lab Results  Component Value Date   HGBA1C 6.6* 07/07/2013   Assessment/Plan 1. Atherosclerosis of native coronary artery of native heart without angina pectoris -stable at this time, asymptomatic - Comprehensive metabolic panel  2. Chronic systolic heart failure -chronic dyspnea on exertion, otherwise doing fine, remains active  3. Type II or unspecified type diabetes mellitus without mention of complication, not stated as uncontrolled -sugars well controlled, doing well - Hemoglobin A1c - B12 and Folate Panel  4. Anemia due to vitamin B12 deficiency -requests b12 be checked so ordered today - B12 and Folate Panel  5. Chronic kidney disease, stage III (moderate) - stable, needs f/u labs - Comprehensive metabolic panel - V40 and Folate Panel  6. Essential hypertension, benign -  at goal given age and fall risk - Comprehensive metabolic panel  7. Need for shingles vaccine - zoster vaccine live, PF, (ZOSTAVAX) 54270 UNT/0.65ML injection; Inject 19,400 Units into the skin once.  Dispense: 1 each; Refill:  0  Labs/tests ordered:   Orders Placed This Encounter  Procedures  . Comprehensive metabolic panel  . Hemoglobin A1c  . B12 and Folate Panel   Next appt:  6 mos

## 2014-05-28 NOTE — Patient Instructions (Signed)
Please bring by your living will and health care power of attorney documentation so we can scan them into your health record.

## 2014-05-30 IMAGING — CR DG CHEST 2V
2 series · 2 of 2 positions shown · non-contrast
Comparison: 07/24/2012

CLINICAL DATA: Pleural effusion

CHEST - 2 VIEW

[w chest pa]
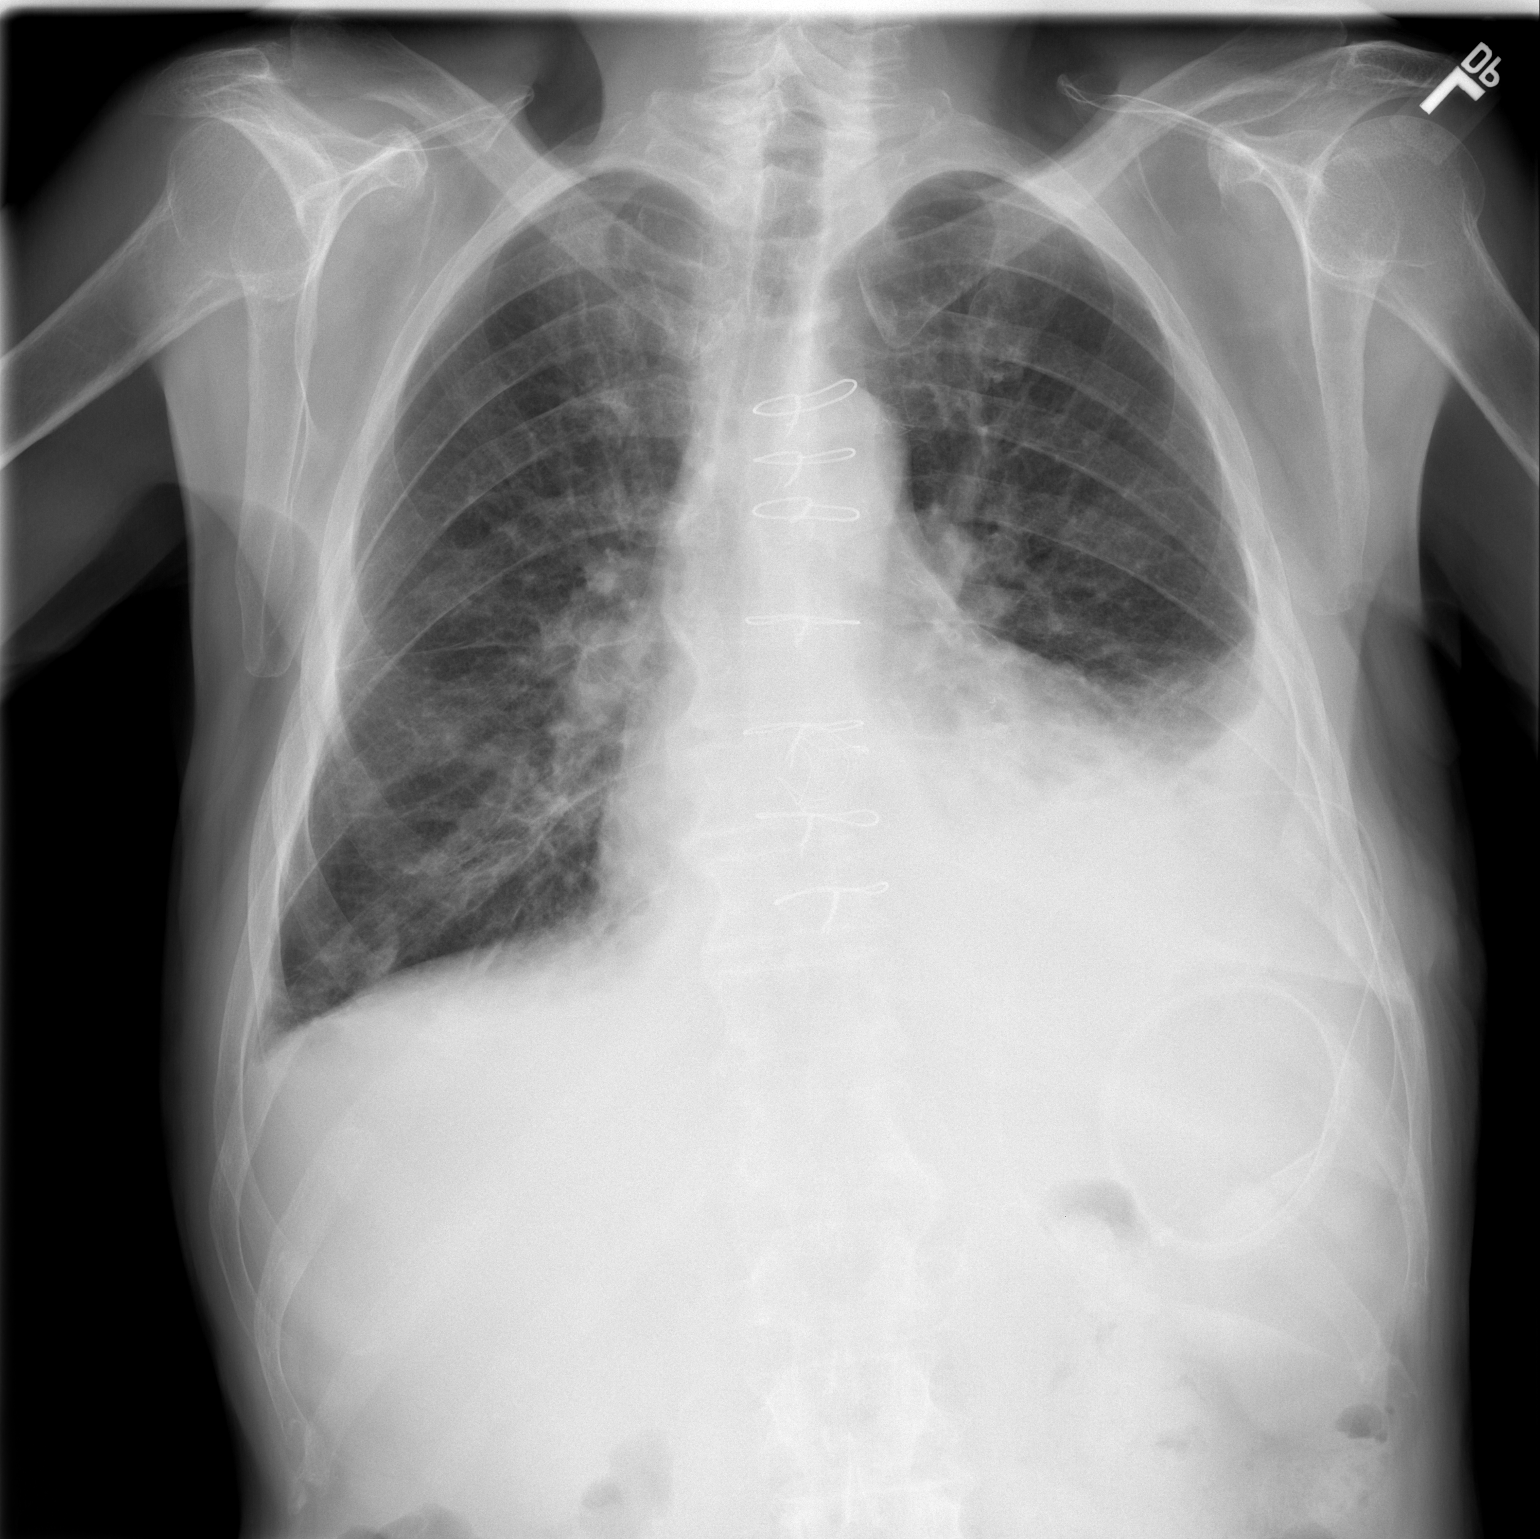

[w chest lat]
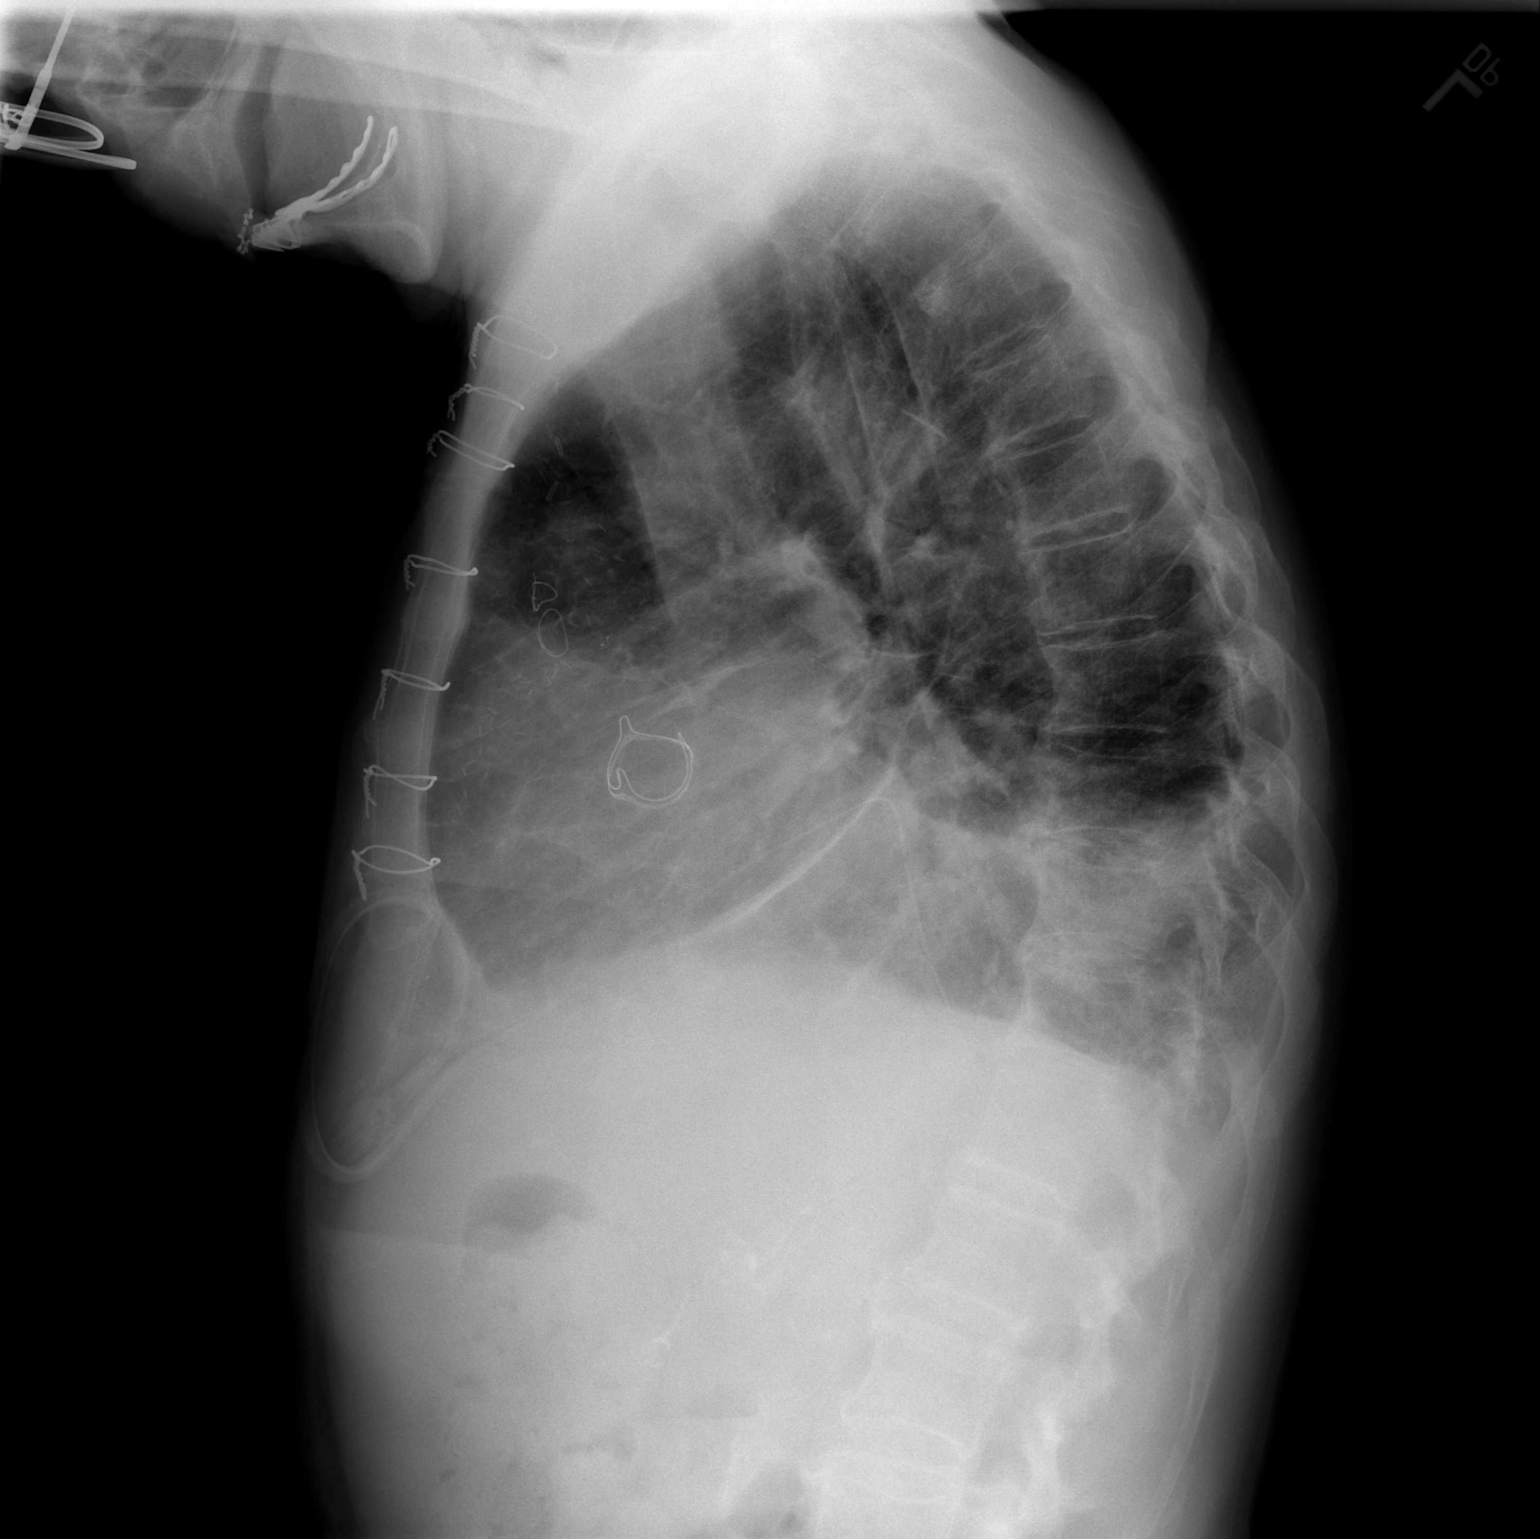

[2 of 2 positions shown; findings below may reference images not displayed]

FINDINGS: Left basilar thoracic Pleurx drain is stable in position.
The tip is positioned in the posterior costophrenic sulcus.  The
pneumothorax has resolved.  A moderate pleural effusion has
recurred.

Opacity throughout the right hemithorax has markedly improved.
Subsegmental atelectasis at the right base persist.  Stable
postoperative changes and thoracic spine.
IMPRESSION: Resolved left pneumothorax.

Moderate left pleural effusion has reaccumulated.

Left Pleurx drain is stable.

Improved right pleural effusion with residual right basilar
atelectasis.

## 2014-06-03 ENCOUNTER — Other Ambulatory Visit (HOSPITAL_BASED_OUTPATIENT_CLINIC_OR_DEPARTMENT_OTHER): Payer: Medicare Other

## 2014-06-03 ENCOUNTER — Ambulatory Visit: Payer: Medicare Other

## 2014-06-03 DIAGNOSIS — N179 Acute kidney failure, unspecified: Secondary | ICD-10-CM

## 2014-06-03 DIAGNOSIS — D500 Iron deficiency anemia secondary to blood loss (chronic): Secondary | ICD-10-CM

## 2014-06-03 DIAGNOSIS — N189 Chronic kidney disease, unspecified: Principal | ICD-10-CM

## 2014-06-03 DIAGNOSIS — D649 Anemia, unspecified: Secondary | ICD-10-CM | POA: Diagnosis not present

## 2014-06-03 LAB — CBC WITH DIFFERENTIAL/PLATELET
BASO%: 0.3 % (ref 0.0–2.0)
BASOS ABS: 0 10*3/uL (ref 0.0–0.1)
EOS%: 3.5 % (ref 0.0–7.0)
Eosinophils Absolute: 0.2 10*3/uL (ref 0.0–0.5)
HCT: 36.1 % — ABNORMAL LOW (ref 38.4–49.9)
HEMOGLOBIN: 11.7 g/dL — AB (ref 13.0–17.1)
LYMPH#: 2.1 10*3/uL (ref 0.9–3.3)
LYMPH%: 37.3 % (ref 14.0–49.0)
MCH: 32.3 pg (ref 27.2–33.4)
MCHC: 32.4 g/dL (ref 32.0–36.0)
MCV: 99.6 fL — AB (ref 79.3–98.0)
MONO#: 0.4 10*3/uL (ref 0.1–0.9)
MONO%: 7.1 % (ref 0.0–14.0)
NEUT#: 3 10*3/uL (ref 1.5–6.5)
NEUT%: 51.8 % (ref 39.0–75.0)
PLATELETS: 128 10*3/uL — AB (ref 140–400)
RBC: 3.63 10*6/uL — ABNORMAL LOW (ref 4.20–5.82)
RDW: 14.3 % (ref 11.0–14.6)
WBC: 5.7 10*3/uL (ref 4.0–10.3)

## 2014-06-03 MED ORDER — DARBEPOETIN ALFA-POLYSORBATE 300 MCG/0.6ML IJ SOLN: 300.0000 ug | Freq: Once | INTRAMUSCULAR | Status: DC

## 2014-06-10 ENCOUNTER — Other Ambulatory Visit: Payer: Medicare Other

## 2014-06-10 DIAGNOSIS — I10 Essential (primary) hypertension: Secondary | ICD-10-CM

## 2014-06-10 DIAGNOSIS — D649 Anemia, unspecified: Secondary | ICD-10-CM | POA: Diagnosis not present

## 2014-06-11 LAB — BASIC METABOLIC PANEL
BUN/Creatinine Ratio: 17 (ref 10–22)
BUN: 44 mg/dL — ABNORMAL HIGH (ref 8–27)
CO2: 28 mmol/L (ref 18–29)
Calcium: 9.8 mg/dL (ref 8.6–10.2)
Chloride: 97 mmol/L (ref 97–108)
Creatinine, Ser: 2.59 mg/dL — ABNORMAL HIGH (ref 0.76–1.27)
GFR calc Af Amer: 25 mL/min/{1.73_m2} — ABNORMAL LOW (ref >59)
GFR calc non Af Amer: 21 mL/min/{1.73_m2} — ABNORMAL LOW (ref >59)
Glucose: 261 mg/dL — ABNORMAL HIGH (ref 65–99)
Potassium: 4.6 mmol/L (ref 3.5–5.2)
Sodium: 140 mmol/L (ref 134–144)

## 2014-06-24 ENCOUNTER — Other Ambulatory Visit: Payer: Medicare Other

## 2014-06-24 ENCOUNTER — Ambulatory Visit: Payer: Medicare Other

## 2014-06-28 ENCOUNTER — Inpatient Hospital Stay (HOSPITAL_COMMUNITY)
Admission: EM | Admit: 2014-06-28 | Discharge: 2014-07-09 | DRG: 444 | Disposition: A | Discharge status: Home / Self Care | Payer: Medicare Other | Attending: Internal Medicine | Admitting: Internal Medicine

## 2014-06-28 ENCOUNTER — Encounter (HOSPITAL_COMMUNITY): Payer: Self-pay | Admitting: Emergency Medicine

## 2014-06-28 ENCOUNTER — Emergency Department (HOSPITAL_COMMUNITY): Payer: Medicare Other

## 2014-06-28 DIAGNOSIS — B9689 Other specified bacterial agents as the cause of diseases classified elsewhere: Secondary | ICD-10-CM | POA: Diagnosis present

## 2014-06-28 DIAGNOSIS — D649 Anemia, unspecified: Secondary | ICD-10-CM | POA: Diagnosis not present

## 2014-06-28 DIAGNOSIS — N184 Chronic kidney disease, stage 4 (severe): Secondary | ICD-10-CM | POA: Diagnosis present

## 2014-06-28 DIAGNOSIS — Z8249 Family history of ischemic heart disease and other diseases of the circulatory system: Secondary | ICD-10-CM

## 2014-06-28 DIAGNOSIS — A419 Sepsis, unspecified organism: Secondary | ICD-10-CM

## 2014-06-28 DIAGNOSIS — N039 Chronic nephritic syndrome with unspecified morphologic changes: Secondary | ICD-10-CM

## 2014-06-28 DIAGNOSIS — I252 Old myocardial infarction: Secondary | ICD-10-CM | POA: Diagnosis not present

## 2014-06-28 DIAGNOSIS — I2589 Other forms of chronic ischemic heart disease: Secondary | ICD-10-CM | POA: Diagnosis present

## 2014-06-28 DIAGNOSIS — R7989 Other specified abnormal findings of blood chemistry: Secondary | ICD-10-CM | POA: Diagnosis not present

## 2014-06-28 DIAGNOSIS — N269 Renal sclerosis, unspecified: Secondary | ICD-10-CM | POA: Diagnosis not present

## 2014-06-28 DIAGNOSIS — I10 Essential (primary) hypertension: Secondary | ICD-10-CM | POA: Diagnosis not present

## 2014-06-28 DIAGNOSIS — I2510 Atherosclerotic heart disease of native coronary artery without angina pectoris: Secondary | ICD-10-CM | POA: Diagnosis present

## 2014-06-28 DIAGNOSIS — K831 Obstruction of bile duct: Secondary | ICD-10-CM | POA: Diagnosis not present

## 2014-06-28 DIAGNOSIS — L03119 Cellulitis of unspecified part of limb: Secondary | ICD-10-CM

## 2014-06-28 DIAGNOSIS — Z7982 Long term (current) use of aspirin: Secondary | ICD-10-CM | POA: Diagnosis not present

## 2014-06-28 DIAGNOSIS — Z66 Do not resuscitate: Secondary | ICD-10-CM | POA: Diagnosis present

## 2014-06-28 DIAGNOSIS — I059 Rheumatic mitral valve disease, unspecified: Secondary | ICD-10-CM | POA: Diagnosis present

## 2014-06-28 DIAGNOSIS — N189 Chronic kidney disease, unspecified: Secondary | ICD-10-CM

## 2014-06-28 DIAGNOSIS — E119 Type 2 diabetes mellitus without complications: Secondary | ICD-10-CM | POA: Diagnosis present

## 2014-06-28 DIAGNOSIS — K219 Gastro-esophageal reflux disease without esophagitis: Secondary | ICD-10-CM | POA: Diagnosis not present

## 2014-06-28 DIAGNOSIS — R509 Fever, unspecified: Secondary | ICD-10-CM | POA: Diagnosis not present

## 2014-06-28 DIAGNOSIS — I959 Hypotension, unspecified: Secondary | ICD-10-CM

## 2014-06-28 DIAGNOSIS — R109 Unspecified abdominal pain: Secondary | ICD-10-CM | POA: Diagnosis not present

## 2014-06-28 DIAGNOSIS — R748 Abnormal levels of other serum enzymes: Secondary | ICD-10-CM

## 2014-06-28 DIAGNOSIS — I509 Heart failure, unspecified: Secondary | ICD-10-CM | POA: Diagnosis present

## 2014-06-28 DIAGNOSIS — R1010 Upper abdominal pain, unspecified: Secondary | ICD-10-CM

## 2014-06-28 DIAGNOSIS — Z954 Presence of other heart-valve replacement: Secondary | ICD-10-CM

## 2014-06-28 DIAGNOSIS — Z85038 Personal history of other malignant neoplasm of large intestine: Secondary | ICD-10-CM | POA: Diagnosis not present

## 2014-06-28 DIAGNOSIS — Z951 Presence of aortocoronary bypass graft: Secondary | ICD-10-CM

## 2014-06-28 DIAGNOSIS — K8050 Calculus of bile duct without cholangitis or cholecystitis without obstruction: Secondary | ICD-10-CM | POA: Diagnosis not present

## 2014-06-28 DIAGNOSIS — E876 Hypokalemia: Secondary | ICD-10-CM

## 2014-06-28 DIAGNOSIS — R7881 Bacteremia: Secondary | ICD-10-CM | POA: Diagnosis not present

## 2014-06-28 DIAGNOSIS — N183 Chronic kidney disease, stage 3 unspecified: Secondary | ICD-10-CM | POA: Diagnosis not present

## 2014-06-28 DIAGNOSIS — I69998 Other sequelae following unspecified cerebrovascular disease: Secondary | ICD-10-CM | POA: Diagnosis not present

## 2014-06-28 DIAGNOSIS — R0989 Other specified symptoms and signs involving the circulatory and respiratory systems: Secondary | ICD-10-CM | POA: Diagnosis not present

## 2014-06-28 DIAGNOSIS — J90 Pleural effusion, not elsewhere classified: Secondary | ICD-10-CM

## 2014-06-28 DIAGNOSIS — E785 Hyperlipidemia, unspecified: Secondary | ICD-10-CM | POA: Diagnosis present

## 2014-06-28 DIAGNOSIS — Z888 Allergy status to other drugs, medicaments and biological substances status: Secondary | ICD-10-CM

## 2014-06-28 DIAGNOSIS — I129 Hypertensive chronic kidney disease with stage 1 through stage 4 chronic kidney disease, or unspecified chronic kidney disease: Secondary | ICD-10-CM | POA: Diagnosis present

## 2014-06-28 DIAGNOSIS — E875 Hyperkalemia: Secondary | ICD-10-CM

## 2014-06-28 DIAGNOSIS — R066 Hiccough: Secondary | ICD-10-CM | POA: Diagnosis not present

## 2014-06-28 DIAGNOSIS — I119 Hypertensive heart disease without heart failure: Secondary | ICD-10-CM | POA: Diagnosis present

## 2014-06-28 DIAGNOSIS — K222 Esophageal obstruction: Secondary | ICD-10-CM | POA: Diagnosis not present

## 2014-06-28 DIAGNOSIS — K8071 Calculus of gallbladder and bile duct without cholecystitis with obstruction: Secondary | ICD-10-CM | POA: Diagnosis not present

## 2014-06-28 DIAGNOSIS — I25110 Atherosclerotic heart disease of native coronary artery with unstable angina pectoris: Secondary | ICD-10-CM | POA: Diagnosis present

## 2014-06-28 DIAGNOSIS — R7401 Elevation of levels of liver transaminase levels: Secondary | ICD-10-CM | POA: Diagnosis not present

## 2014-06-28 DIAGNOSIS — Z952 Presence of prosthetic heart valve: Secondary | ICD-10-CM

## 2014-06-28 DIAGNOSIS — I255 Ischemic cardiomyopathy: Secondary | ICD-10-CM

## 2014-06-28 DIAGNOSIS — N179 Acute kidney failure, unspecified: Secondary | ICD-10-CM | POA: Diagnosis not present

## 2014-06-28 DIAGNOSIS — D631 Anemia in chronic kidney disease: Secondary | ICD-10-CM | POA: Diagnosis present

## 2014-06-28 DIAGNOSIS — I5042 Chronic combined systolic (congestive) and diastolic (congestive) heart failure: Secondary | ICD-10-CM | POA: Diagnosis present

## 2014-06-28 DIAGNOSIS — K7200 Acute and subacute hepatic failure without coma: Secondary | ICD-10-CM | POA: Diagnosis present

## 2014-06-28 DIAGNOSIS — A498 Other bacterial infections of unspecified site: Secondary | ICD-10-CM | POA: Diagnosis present

## 2014-06-28 DIAGNOSIS — K8051 Calculus of bile duct without cholangitis or cholecystitis with obstruction: Secondary | ICD-10-CM | POA: Diagnosis not present

## 2014-06-28 DIAGNOSIS — I5022 Chronic systolic (congestive) heart failure: Secondary | ICD-10-CM | POA: Diagnosis present

## 2014-06-28 DIAGNOSIS — N281 Cyst of kidney, acquired: Secondary | ICD-10-CM | POA: Diagnosis not present

## 2014-06-28 DIAGNOSIS — I214 Non-ST elevation (NSTEMI) myocardial infarction: Secondary | ICD-10-CM

## 2014-06-28 DIAGNOSIS — R945 Abnormal results of liver function studies: Secondary | ICD-10-CM

## 2014-06-28 DIAGNOSIS — R29898 Other symptoms and signs involving the musculoskeletal system: Secondary | ICD-10-CM | POA: Diagnosis present

## 2014-06-28 DIAGNOSIS — L02419 Cutaneous abscess of limb, unspecified: Secondary | ICD-10-CM

## 2014-06-28 DIAGNOSIS — R1084 Generalized abdominal pain: Secondary | ICD-10-CM

## 2014-06-28 DIAGNOSIS — N400 Enlarged prostate without lower urinary tract symptoms: Secondary | ICD-10-CM | POA: Diagnosis not present

## 2014-06-28 DIAGNOSIS — K8309 Other cholangitis: Secondary | ICD-10-CM | POA: Diagnosis not present

## 2014-06-28 LAB — COMPREHENSIVE METABOLIC PANEL
ALK PHOS: 453 U/L — AB (ref 39–117)
ALT: 182 U/L — ABNORMAL HIGH (ref 0–53)
AST: 235 U/L — AB (ref 0–37)
Albumin: 3.6 g/dL (ref 3.5–5.2)
Anion gap: 14 (ref 5–15)
BUN: 36 mg/dL — ABNORMAL HIGH (ref 6–23)
CHLORIDE: 100 meq/L (ref 96–112)
CO2: 30 mEq/L (ref 19–32)
CREATININE: 2.08 mg/dL — AB (ref 0.50–1.35)
Calcium: 9.5 mg/dL (ref 8.4–10.5)
GFR calc Af Amer: 32 mL/min — ABNORMAL LOW (ref >90)
GFR calc non Af Amer: 27 mL/min — ABNORMAL LOW (ref >90)
Glucose, Bld: 221 mg/dL — ABNORMAL HIGH (ref 70–99)
POTASSIUM: 4.7 meq/L (ref 3.7–5.3)
Sodium: 144 mEq/L (ref 137–147)
Total Bilirubin: 1.4 mg/dL — ABNORMAL HIGH (ref 0.3–1.2)
Total Protein: 7.5 g/dL (ref 6.0–8.3)

## 2014-06-28 LAB — CBC WITH DIFFERENTIAL/PLATELET
BASOS ABS: 0 10*3/uL (ref 0.0–0.1)
BASOS PCT: 0 % (ref 0–1)
Eosinophils Absolute: 0.1 10*3/uL (ref 0.0–0.7)
Eosinophils Relative: 1 % (ref 0–5)
HCT: 37.5 % — ABNORMAL LOW (ref 39.0–52.0)
Hemoglobin: 12.1 g/dL — ABNORMAL LOW (ref 13.0–17.0)
Lymphocytes Relative: 14 % (ref 12–46)
Lymphs Abs: 1 10*3/uL (ref 0.7–4.0)
MCH: 32.9 pg (ref 26.0–34.0)
MCHC: 32.3 g/dL (ref 30.0–36.0)
MCV: 101.9 fL — ABNORMAL HIGH (ref 78.0–100.0)
Monocytes Absolute: 0.2 10*3/uL (ref 0.1–1.0)
Monocytes Relative: 3 % (ref 3–12)
NEUTROS ABS: 5.8 10*3/uL (ref 1.7–7.7)
Neutrophils Relative %: 82 % — ABNORMAL HIGH (ref 43–77)
Platelets: 140 10*3/uL — ABNORMAL LOW (ref 150–400)
RBC: 3.68 MIL/uL — ABNORMAL LOW (ref 4.22–5.81)
RDW: 13.9 % (ref 11.5–15.5)
WBC: 7.1 10*3/uL (ref 4.0–10.5)

## 2014-06-28 LAB — URINALYSIS, ROUTINE W REFLEX MICROSCOPIC
BILIRUBIN URINE: NEGATIVE
Glucose, UA: NEGATIVE mg/dL
Hgb urine dipstick: NEGATIVE
Ketones, ur: NEGATIVE mg/dL
Leukocytes, UA: NEGATIVE
NITRITE: NEGATIVE
Protein, ur: NEGATIVE mg/dL
SPECIFIC GRAVITY, URINE: 1.015 (ref 1.005–1.030)
Urobilinogen, UA: 0.2 mg/dL (ref 0.0–1.0)
pH: 8 (ref 5.0–8.0)

## 2014-06-28 LAB — I-STAT CG4 LACTIC ACID, ED: Lactic Acid, Venous: 2.64 mmol/L — ABNORMAL HIGH (ref 0.5–2.2)

## 2014-06-28 LAB — LIPASE, BLOOD: LIPASE: 34 U/L (ref 11–59)

## 2014-06-28 MED ORDER — DEXTROSE 5 % IV SOLN: 1.0000 g | Freq: Once | INTRAVENOUS | Status: DC

## 2014-06-28 MED ORDER — AZITHROMYCIN 500 MG IV SOLR
500.0000 mg | Freq: Once | INTRAVENOUS | Status: DC
Start: 2014-06-28 — End: 2014-06-28

## 2014-06-28 MED ORDER — ACETAMINOPHEN 325 MG PO TABS
650.0000 mg | ORAL_TABLET | Freq: Once | ORAL | Status: AC
  Administered 2014-06-28: 650 mg via ORAL
  Filled 2014-06-28: qty 2

## 2014-06-28 MED ORDER — MORPHINE SULFATE 4 MG/ML IJ SOLN
6.0000 mg | Freq: Once | INTRAMUSCULAR | Status: AC
  Administered 2014-06-28: 6 mg via INTRAVENOUS
  Filled 2014-06-28: qty 2

## 2014-06-28 MED ORDER — PIPERACILLIN-TAZOBACTAM 3.375 G IVPB
3.3750 g | Freq: Once | INTRAVENOUS | Status: AC
  Administered 2014-06-29: 3.375 g via INTRAVENOUS
  Filled 2014-06-28 (×2): qty 50

## 2014-06-28 MED ORDER — ONDANSETRON HCL 4 MG/2ML IJ SOLN
4.0000 mg | Freq: Once | INTRAMUSCULAR | Status: AC
  Administered 2014-06-28: 4 mg via INTRAVENOUS
  Filled 2014-06-28: qty 2

## 2014-06-28 MED ORDER — VANCOMYCIN HCL IN DEXTROSE 1-5 GM/200ML-% IV SOLN
1000.0000 mg | Freq: Once | INTRAVENOUS | Status: AC
  Administered 2014-06-28: 1000 mg via INTRAVENOUS
  Filled 2014-06-28: qty 200

## 2014-06-28 NOTE — ED Provider Notes (Signed)
CSN: [id number]     Arrival date & time 06/28/14  2040 History   First MD Initiated Contact with Patient 06/28/14 2128     Chief Complaint  Patient presents with  . Abdominal Pain      HPI Patient reports developing rather sudden onset abdominal cramping and discomfort that began approximately 3 hours ago.  He presents with riders and abdominal pain.  He states pain is moderate to severe in severity at this time.  No nausea vomiting or diarrhea.  Some cough over the past several days without shortness of breath.  No recent sick contacts.  No recent illness.  Was in his normal state of health earlier today.  Patient is status post cholecystectomy.   Past Medical History  Diagnosis Date  . History of colon cancer     s/p colon resection  . Aortic stenosis     a. s/p AVR with 19mm Edwards pericardial valve 02/07/12 - post-op course complicated by pleural effusion requring thoracentesis/leg cellulitis 03/2012.  Marland Kitchen CAD (coronary artery disease)     a. s/p NSTEMI 12/2011:  LHC - Ostial left main 20%, ostial LAD 50%, mid 60-70%, ostial D1 40% and mid 40%, D2 70%, ostial circumflex occluded, ostial RCA 80-90%, LVEDP was 42. b.  s/p CABG x 3 at time of AVR (LiMA-LAD, SVG-2nd daigonal, SVG-PDA) 02/07/12 (post-op course noted above).  . Ischemic cardiomyopathy   . Chronic systolic heart failure     a. TEE 01/2012: EF 25-30%, diffuse hypokinesis. b. Not on ACEI due to renal insufficiency.;  c. follow up  echo 08/06/12: EF 25%, mod diast dysfxn, AVR ok, mild MR, mod LAE, mild RAE, mild to mod RV systolic dysfunction  . HLD (hyperlipidemia)   . GERD (gastroesophageal reflux disease)   . Chronic kidney disease   . Pleural effusion     a. post-operatively after AVR/CABG s/p thoracentesis 03/2012 yielding 1L serosanguinous fluid.  . Diabetes mellitus     borderline  . Blood transfusion     NO REACTION TO TRANSFUSION  . Cellulitis     a. RLE cellulitis 2 months post-operatively after AVR/CABG - serratia  marcessans, tx with I&D/antibiotics  . Mitral regurgitation     Moderate by TEE 01/2012  . Flash pulmonary edema     Post-cath 12/2011, went into acute pulm edema requiring IV lasix and intubation  . Baker's cyst 07/23/12    Incidental finding of LE venous dopplers  . Cataract     right eye, hx of  . HTN (hypertension)     primary, Dr. Jose Persia  . Myocardial infarction 12/2011  . CHF (congestive heart failure)   . Stroke 10/01    RIght Leg weakness  . Cancer '90's    Colon  . Anemia    Past Surgical History  Procedure Laterality Date  . Colon resection  1996  . Esophageal dilation    . Colonoscopy    . Cataract extraction      rt  . Cardiac catheterization      1.3.13  stopped breathing, put on ventilator for 5-6 days  . Tonsillectomy    . Aortic valve replacement  02/07/2012    Procedure: AORTIC VALVE REPLACEMENT (AVR);  Surgeon: Gaye Pollack, MD;  Location: [facility];  Service: Open Heart Surgery;  Laterality: N/A;  . Coronary artery bypass graft  02/07/2012    Procedure: CORONARY ARTERY BYPASS GRAFTING (CABG);  Surgeon: Gaye Pollack, MD;  Location: [facility];  Service: Open Heart Surgery;  Laterality: N/A;  CABG x three; using right leg greater saphenous vein harvested endoscopically  . Chest tube insertion  07/24/2012    Procedure: INSERTION PLEURAL DRAINAGE CATHETER;  Surgeon: Gaye Pollack, MD;  Location: [facility];  Service: Thoracic;  Laterality: Left;  . Talc pleurodesis  08/30/2012    Procedure: TALC PLEURADESIS;  Surgeon: Gaye Pollack, MD;  Location: [facility];  Service: Thoracic;  Laterality: Left;  INSERTION OF TALC VIA LEFT PLEURX  . Talc pleurodesis  09/12/2012    Procedure: TALC PLEURADESIS;  Surgeon: Gaye Pollack, MD;  Location: [place];  Service: Thoracic;  Laterality: Left;  . Removal of pleural drainage catheter  09/27/2012    Procedure: REMOVAL OF PLEURAL DRAINAGE CATHETER;  Surgeon: Gaye Pollack, MD;  Location: [facility];  Service: Thoracic;  Laterality: Left;  Marland Kitchen Eye  surgery  2009    cataract removed from right eye   Family History  Problem Relation Age of Onset  . Cancer Mother   . Heart attack Father   . Mental illness Brother   . Kidney failure Brother    History  Substance Use Topics  . Smoking status: Never Smoker   . Smokeless tobacco: Never Used  . Alcohol Use: No    Review of Systems  All other systems reviewed and are negative.     Allergies  Statins  Home Medications   Prior to Admission medications   Medication Sig Start Date End Date Taking? Authorizing Provider  amoxicillin (AMOXIL) 500 MG capsule Take 2,000 mg by mouth See admin instructions. Take 4 capsules (2000 mg) prior to dental appointment   Yes Historical Provider, MD  aspirin EC 325 MG tablet Take 325 mg by mouth daily.    Yes Historical Provider, MD  carvedilol (COREG) 25 MG tablet Take 1 tablet (25 mg total) by mouth 2 (two) times daily with a meal. 10/30/13  Yes Jolaine Artist, MD  cholecalciferol (VITAMIN D) 1000 UNITS tablet Take 1,000 Units by mouth daily with supper.    Yes Historical Provider, MD  furosemide (LASIX) 40 MG tablet Take 40 mg by mouth 2 (two) times daily. At breakfast and at lunch   Yes Historical Provider, MD  hydrALAZINE (APRESOLINE) 25 MG tablet Take 1 tablet (25 mg total) by mouth 3 (three) times daily. 10/30/13  Yes Jolaine Artist, MD  isosorbide mononitrate (IMDUR) 60 MG 24 hr tablet Take 1 tablet (60 mg total) by mouth daily. 02/17/14  Yes Jolaine Artist, MD  Multiple Vitamin (MULTIVITAMIN WITH MINERALS) TABS tablet Take 1 tablet by mouth daily.   Yes Historical Provider, MD  nitroGLYCERIN (NITROSTAT) 0.4 MG SL tablet Place 0.4 mg under the tongue every 5 (five) minutes as needed for chest pain.   Yes Historical Provider, MD  omeprazole (PRILOSEC) 20 MG capsule Take 20 mg by mouth daily.    Yes Historical Provider, MD  Prenatal Vit-Fe Fumarate-FA (MULTIVITAMIN-PRENATAL) 27-0.8 MG TABS Take 1 tablet by mouth daily with supper.     Yes Historical Provider, MD  tamsulosin (FLOMAX) 0.4 MG CAPS capsule Take 0.4 mg by mouth daily after supper. Take 30 minutes after eating   Yes Historical Provider, MD  Travoprost, BAK Free, (TRAVATAN) 0.004 % SOLN ophthalmic solution Place 1 drop into both eyes at bedtime.    Yes Historical Provider, MD   BP 142/57  Pulse 88  Temp(Src) 103 F (39.4 C) (Rectal)  Resp 22  Ht 5\' 8"  (1.727 m)  Wt 138 lb 9  oz (62.852 kg)  BMI 21.07 kg/m2  SpO2 91% Physical Exam  Nursing note and vitals reviewed. Constitutional: He is oriented to person, place, and time. He appears well-developed and well-nourished.  Uncomfortable appearing  HENT:  Head: Normocephalic and atraumatic.  Eyes: EOM are normal.  Neck: Normal range of motion.  Cardiovascular: Normal rate, regular rhythm, normal heart sounds and intact distal pulses.   Pulmonary/Chest: Effort normal and breath sounds normal. No respiratory distress.  Abdominal: Soft. He exhibits no distension.  Mild upper abdominal tenderness without guarding or rebound  Musculoskeletal: Normal range of motion.  Neurological: He is alert and oriented to person, place, and time.  Skin: Skin is warm and dry.  Psychiatric: He has a normal mood and affect. Judgment normal.    ED Course  Procedures (including critical care time) Labs Review Labs Reviewed  CBC WITH DIFFERENTIAL - Abnormal; Notable for the following:    RBC 3.68 (*)    Hemoglobin 12.1 (*)    HCT 37.5 (*)    MCV 101.9 (*)    Platelets 140 (*)    Neutrophils Relative % 82 (*)    All other components within normal limits  COMPREHENSIVE METABOLIC PANEL - Abnormal; Notable for the following:    Glucose, Bld 221 (*)    BUN 36 (*)    Creatinine, Ser 2.08 (*)    AST 235 (*)    ALT 182 (*)    Alkaline Phosphatase 453 (*)    Total Bilirubin 1.4 (*)    GFR calc non Af Amer 27 (*)    GFR calc Af Amer 32 (*)    All other components within normal limits  URINALYSIS, ROUTINE W REFLEX MICROSCOPIC  - Abnormal; Notable for the following:    APPearance CLOUDY (*)    All other components within normal limits  I-STAT CG4 LACTIC ACID, ED - Abnormal; Notable for the following:    Lactic Acid, Venous 2.64 (*)    All other components within normal limits  CULTURE, BLOOD (ROUTINE X 2)  CULTURE, BLOOD (ROUTINE X 2)  URINE CULTURE  LIPASE, BLOOD  HEPATITIS PANEL, ACUTE    Imaging Review Ct Abdomen Pelvis Wo Contrast  06/28/2014   CLINICAL DATA:  Generalized abdominal pain and cramping.  EXAM: CT ABDOMEN AND PELVIS WITHOUT CONTRAST  TECHNIQUE: Multidetector CT imaging of the abdomen and pelvis was performed following the standard protocol without IV contrast.  COMPARISON:  None.  FINDINGS: Mild nodular densities at the left lung base are nonspecific but could reflect atelectasis. Mild associated scarring is seen. Diffuse coronary artery calcifications are seen. Calcification is also noted at the aortic valve. The patient is status post median sternotomy.  There is partial atrophy of the right hepatic lobe. The liver and spleen are otherwise grossly unremarkable in appearance. The patient is status post cholecystectomy. The pancreas is atrophic; the adrenal glands are unremarkable in appearance.  Mild bilateral renal atrophy is noted, worse on the left. Mild nonspecific perinephric stranding is seen bilaterally. A small 1.0 cm exophytic cyst is noted arising at the lower pole of the right kidney. The kidneys are otherwise unremarkable. There is no evidence of hydronephrosis. No renal or ureteral stones are seen.  No free fluid is identified. There is a moderate to large right inguinal hernia, containing a relatively long segment of ileum, without evidence for obstruction or strangulation at this time. The small bowel is otherwise unremarkable in appearance. A small left inguinal hernia is seen, containing only fat. The  stomach is within normal limits. No acute vascular abnormalities are seen. Scattered  calcification is seen along the abdominal aorta and its branches.  Postoperative change is noted at the right lower quadrant. The appendix is not definitely seen. Scattered diverticulosis is noted along the descending and proximal sigmoid colon, without evidence of diverticulitis.  The bladder is mildly distended and grossly unremarkable in appearance. The prostate is enlarged, measuring 5.0 cm in transverse dimension, with minimal calcification. No inguinal lymphadenopathy is seen.  No acute osseous abnormalities are identified. Degenerative change is noted at L5, with a prominent Schmorl's node and associated sclerotic change.  IMPRESSION: 1. No acute abnormality seen to explain the patient's symptoms. 2. Moderate to large right inguinal hernia, containing a relatively long segment of ileum, without evidence for obstruction or strangulation at this time. 3. Small left inguinal hernia, containing only fat. 4. Mild bilateral renal atrophy, worse on the left. Small right renal cyst seen. 5. Partial atrophy of the right hepatic lobe. 6. Scattered nodular densities at the left lung base are nonspecific, but could reflect atelectasis. Mild associated scarring seen. 7. Diffuse coronary artery calcifications noted. Calcification noted at the mitral valve. 8. Scattered diverticulosis along the descending and proximal sigmoid colon, without evidence of diverticulitis. 9. Enlarged prostate noted. 10. Scattered calcification along the abdominal aorta and branches.   Electronically Signed   By: Garald Balding M.D.   On: 06/28/2014 22:15   Dg Chest 2 View  06/28/2014   CLINICAL DATA:  Diffuse abdominal pain.  EXAM: CHEST  2 VIEW  COMPARISON:  Chest radiograph performed 04/11/2013  FINDINGS: The lungs are well-aerated. Mild left basilar airspace opacity may reflect atelectasis or possibly pneumonia. Mild vascular congestion is noted. There is no evidence of pleural effusion or pneumothorax.  The heart is mildly enlarged. The  patient is status post median sternotomy. An aortic valve replacement is noted. No acute osseous abnormalities are seen.  IMPRESSION: 1. Mild left basilar airspace opacity may reflect atelectasis or possibly mild pneumonia. 2. Mild vascular congestion and mild cardiomegaly.   Electronically Signed   By: Garald Balding M.D.   On: 06/28/2014 23:08  I personally reviewed the imaging tests through PACS system I reviewed available ER/hospitalization records through the EMR    EKG Interpretation None      MDM   Final diagnoses:  Fever, unspecified fever cause  Elevated liver enzymes  Pain of upper abdomen    11:28 PM Patient's discomfort and pain is improved at this time.  Fever to 103 on arrival to the emergency department.  Questionable infiltrate on chest x-ray however am not convinced this is pneumonia.  Elevated alkaline phosphatase and liver enzymes.  Could represent ascending cholangitis.  Repeat abdominal exam demonstrates no significant right upper quadrant or upper abdominal tenderness at this time.  Vancomycin and Zosyn given.  Urine culture pending.  Blood cultures pending.  Admit to the hospital.  Family updated.    Hoy Morn, MD 06/28/14 2329

## 2014-06-28 NOTE — H&P (Signed)
Patient's PCP: Hollace Kinnier, DO  Chief Complaint: Abdominal pain and fever  History of Present Illness: Icholas [name] is a 80 y.o. Caucasian male with history of colon cancer status post resection, status post cholecystectomy, coronary artery disease, hyperlipidemia, GERD, chronic kidney disease stage III, chronic systolic congestive heart failure, diabetes which is diet controlled, moderate mitral regurgitation, hypertension, and history of CVA who presents with the above complaints.  Patient provided some of the history, but family at bedside provided most of the history.  Patient's symptoms started at 7 p.m. today after having his dinner which consisted of eating a sandwich and cantaloupe.  Subsequently after that he started having abdominal cramping followed by sharp generalized abdominal pain.  The symptoms were quite severe as a result he presented to the emergency department for further evaluation.  In the emergency department he was given morphine which improved his pain.  He also had a temperature of 103 and was subsequently given Tylenol.  CT of abdomen and pelvis showed no acute abnormality and chest x-ray showed mild left basilar airspace opacity which may reflect atelectasis or mild pneumonia.  Due to his fever and abdominal pain hospitalist service was asked to admit the patient for further care and management.  By the time I evaluated the patient, his abdominal pain had significantly improved and is more comfortable.  Patient denies any chest pain, shortness of breath, cough, diarrhea, headaches or vision changes.  He had some chills earlier today.  Review of Systems: All systems reviewed with the patient and positive as per history of present illness, otherwise all other systems are negative.  Past Medical History  Diagnosis Date  . History of colon cancer     s/p colon resection  . Aortic stenosis     a. s/p AVR with 86mm Edwards pericardial valve 02/07/12 - post-op course  complicated by pleural effusion requring thoracentesis/leg cellulitis 03/2012.  Marland Kitchen CAD (coronary artery disease)     a. s/p NSTEMI 12/2011:  LHC - Ostial left main 20%, ostial LAD 50%, mid 60-70%, ostial D1 40% and mid 40%, D2 70%, ostial circumflex occluded, ostial RCA 80-90%, LVEDP was 42. b.  s/p CABG x 3 at time of AVR (LiMA-LAD, SVG-2nd daigonal, SVG-PDA) 02/07/12 (post-op course noted above).  . Ischemic cardiomyopathy   . Chronic systolic heart failure     a. TEE 01/2012: EF 25-30%, diffuse hypokinesis. b. Not on ACEI due to renal insufficiency.;  c. follow up  echo 08/06/12: EF 25%, mod diast dysfxn, AVR ok, mild MR, mod LAE, mild RAE, mild to mod RV systolic dysfunction  . HLD (hyperlipidemia)   . GERD (gastroesophageal reflux disease)   . Chronic kidney disease   . Pleural effusion     a. post-operatively after AVR/CABG s/p thoracentesis 03/2012 yielding 1L serosanguinous fluid.  . Diabetes mellitus     borderline  . Blood transfusion     NO REACTION TO TRANSFUSION  . Cellulitis     a. RLE cellulitis 2 months post-operatively after AVR/CABG - serratia marcessans, tx with I&D/antibiotics  . Mitral regurgitation     Moderate by TEE 01/2012  . Flash pulmonary edema     Post-cath 12/2011, went into acute pulm edema requiring IV lasix and intubation  . Baker's cyst 07/23/12    Incidental finding of LE venous dopplers  . Cataract     right eye, hx of  . HTN (hypertension)     primary, Dr. Jose Persia  . Myocardial infarction 12/2011  . CHF (  congestive heart failure)   . Stroke 10/01    RIght Leg weakness  . Cancer '90's    Colon  . Anemia    Past Surgical History  Procedure Laterality Date  . Colon resection  1996  . Esophageal dilation    . Colonoscopy    . Cataract extraction      rt  . Cardiac catheterization      1.3.13  stopped breathing, put on ventilator for 5-6 days  . Tonsillectomy    . Aortic valve replacement  02/07/2012    Procedure: AORTIC VALVE REPLACEMENT (AVR);   Surgeon: Gaye Pollack, MD;  Location: [place];  Service: Open Heart Surgery;  Laterality: N/A;  . Coronary artery bypass graft  02/07/2012    Procedure: CORONARY ARTERY BYPASS GRAFTING (CABG);  Surgeon: Gaye Pollack, MD;  Location: [place];  Service: Open Heart Surgery;  Laterality: N/A;  CABG x three; using right leg greater saphenous vein harvested endoscopically  . Chest tube insertion  07/24/2012    Procedure: INSERTION PLEURAL DRAINAGE CATHETER;  Surgeon: Gaye Pollack, MD;  Location: [place];  Service: Thoracic;  Laterality: Left;  . Talc pleurodesis  08/30/2012    Procedure: TALC PLEURADESIS;  Surgeon: Gaye Pollack, MD;  Location: [place];  Service: Thoracic;  Laterality: Left;  INSERTION OF TALC VIA LEFT PLEURX  . Talc pleurodesis  09/12/2012    Procedure: TALC PLEURADESIS;  Surgeon: Gaye Pollack, MD;  Location: [place];  Service: Thoracic;  Laterality: Left;  . Removal of pleural drainage catheter  09/27/2012    Procedure: REMOVAL OF PLEURAL DRAINAGE CATHETER;  Surgeon: Gaye Pollack, MD;  Location: [place];  Service: Thoracic;  Laterality: Left;  Marland Kitchen Eye surgery  2009    cataract removed from right eye   Family History  Problem Relation Age of Onset  . Cancer Mother   . Heart attack Father   . Mental illness Brother   . Kidney failure Brother    History   Social History  . Marital Status: Married    Spouse Name: N/A    Number of Children: N/A  . Years of Education: N/A   Occupational History  . Not on file.   Social History Main Topics  . Smoking status: Never Smoker   . Smokeless tobacco: Never Used  . Alcohol Use: No  . Drug Use: No  . Sexual Activity: Not Currently   Other Topics Concern  . Not on file   Social History Narrative  . No narrative on file   Allergies: Statins  Home Meds: Prior to Admission medications   Medication Sig Start Date End Date Taking? Authorizing Provider  amoxicillin (AMOXIL) 500 MG capsule Take 2,000 mg by mouth See admin  instructions. Take 4 capsules (2000 mg) prior to dental appointment   Yes Historical Provider, MD  aspirin EC 325 MG tablet Take 325 mg by mouth daily.    Yes Historical Provider, MD  carvedilol (COREG) 25 MG tablet Take 1 tablet (25 mg total) by mouth 2 (two) times daily with a meal. 10/30/13  Yes Jolaine Artist, MD  cholecalciferol (VITAMIN D) 1000 UNITS tablet Take 1,000 Units by mouth daily with supper.    Yes Historical Provider, MD  furosemide (LASIX) 40 MG tablet Take 40 mg by mouth 2 (two) times daily. At breakfast and at lunch   Yes Historical Provider, MD  hydrALAZINE (APRESOLINE) 25 MG tablet Take 1 tablet (25 mg total) by mouth 3 (  three) times daily. 10/30/13  Yes Jolaine Artist, MD  isosorbide mononitrate (IMDUR) 60 MG 24 hr tablet Take 1 tablet (60 mg total) by mouth daily. 02/17/14  Yes Jolaine Artist, MD  Multiple Vitamin (MULTIVITAMIN WITH MINERALS) TABS tablet Take 1 tablet by mouth daily.   Yes Historical Provider, MD  nitroGLYCERIN (NITROSTAT) 0.4 MG SL tablet Place 0.4 mg under the tongue every 5 (five) minutes as needed for chest pain.   Yes Historical Provider, MD  omeprazole (PRILOSEC) 20 MG capsule Take 20 mg by mouth daily.    Yes Historical Provider, MD  Prenatal Vit-Fe Fumarate-FA (MULTIVITAMIN-PRENATAL) 27-0.8 MG TABS Take 1 tablet by mouth daily with supper.    Yes Historical Provider, MD  tamsulosin (FLOMAX) 0.4 MG CAPS capsule Take 0.4 mg by mouth daily after supper. Take 30 minutes after eating   Yes Historical Provider, MD  Travoprost, BAK Free, (TRAVATAN) 0.004 % SOLN ophthalmic solution Place 1 drop into both eyes at bedtime.    Yes Historical Provider, MD    Physical Exam: Blood pressure 142/57, pulse 88, temperature 103 F (39.4 C), temperature source Rectal, resp. rate 22, height 5\' 8"  (1.727 m), weight 62.852 kg (138 lb 9 oz), SpO2 91.00%. General: Awake, Oriented x3, No acute distress. HEENT: EOMI, Moist mucous membranes Neck: Supple CV: S1 and  S2, grade 4/6 systolic murmur Lungs: Clear to ascultation bilaterally Abdomen: Soft, Nontender, Nondistended, +bowel sounds. Ext: Good pulses. Trace edema. No clubbing or cyanosis noted. Neuro: Cranial Nerves II-XII grossly intact. Has 5/5 motor strength in upper and lower extremities.  Lab results:  Recent Labs  06/28/14 2058  NA 144  K 4.7  CL 100  CO2 30  GLUCOSE 221*  BUN 36*  CREATININE 2.08*  CALCIUM 9.5    Recent Labs  06/28/14 2058  AST 235*  ALT 182*  ALKPHOS 453*  BILITOT 1.4*  PROT 7.5  ALBUMIN 3.6    Recent Labs  06/28/14 2058  LIPASE 34    Recent Labs  06/28/14 2058  WBC 7.1  NEUTROABS 5.8  HGB 12.1*  HCT 37.5*  MCV 101.9*  PLT 140*   No results found for this basename: CKTOTAL, CKMB, CKMBINDEX, TROPONINI,  in the last 72 hours No components found with this basename: POCBNP,  No results found for this basename: DDIMER,  in the last 72 hours No results found for this basename: HGBA1C,  in the last 72 hours No results found for this basename: CHOL, HDL, LDLCALC, TRIG, CHOLHDL, LDLDIRECT,  in the last 72 hours No results found for this basename: TSH, T4TOTAL, FREET3, T3FREE, THYROIDAB,  in the last 72 hours No results found for this basename: VITAMINB12, FOLATE, FERRITIN, TIBC, IRON, RETICCTPCT,  in the last 72 hours Imaging results:  Ct Abdomen Pelvis Wo Contrast  06/28/2014   CLINICAL DATA:  Generalized abdominal pain and cramping.  EXAM: CT ABDOMEN AND PELVIS WITHOUT CONTRAST  TECHNIQUE: Multidetector CT imaging of the abdomen and pelvis was performed following the standard protocol without IV contrast.  COMPARISON:  None.  FINDINGS: Mild nodular densities at the left lung base are nonspecific but could reflect atelectasis. Mild associated scarring is seen. Diffuse coronary artery calcifications are seen. Calcification is also noted at the aortic valve. The patient is status post median sternotomy.  There is partial atrophy of the right hepatic  lobe. The liver and spleen are otherwise grossly unremarkable in appearance. The patient is status post cholecystectomy. The pancreas is atrophic; the adrenal glands are unremarkable  in appearance.  Mild bilateral renal atrophy is noted, worse on the left. Mild nonspecific perinephric stranding is seen bilaterally. A small 1.0 cm exophytic cyst is noted arising at the lower pole of the right kidney. The kidneys are otherwise unremarkable. There is no evidence of hydronephrosis. No renal or ureteral stones are seen.  No free fluid is identified. There is a moderate to large right inguinal hernia, containing a relatively long segment of ileum, without evidence for obstruction or strangulation at this time. The small bowel is otherwise unremarkable in appearance. A small left inguinal hernia is seen, containing only fat. The stomach is within normal limits. No acute vascular abnormalities are seen. Scattered calcification is seen along the abdominal aorta and its branches.  Postoperative change is noted at the right lower quadrant. The appendix is not definitely seen. Scattered diverticulosis is noted along the descending and proximal sigmoid colon, without evidence of diverticulitis.  The bladder is mildly distended and grossly unremarkable in appearance. The prostate is enlarged, measuring 5.0 cm in transverse dimension, with minimal calcification. No inguinal lymphadenopathy is seen.  No acute osseous abnormalities are identified. Degenerative change is noted at L5, with a prominent Schmorl's node and associated sclerotic change.  IMPRESSION: 1. No acute abnormality seen to explain the patient's symptoms. 2. Moderate to large right inguinal hernia, containing a relatively long segment of ileum, without evidence for obstruction or strangulation at this time. 3. Small left inguinal hernia, containing only fat. 4. Mild bilateral renal atrophy, worse on the left. Small right renal cyst seen. 5. Partial atrophy of the  right hepatic lobe. 6. Scattered nodular densities at the left lung base are nonspecific, but could reflect atelectasis. Mild associated scarring seen. 7. Diffuse coronary artery calcifications noted. Calcification noted at the mitral valve. 8. Scattered diverticulosis along the descending and proximal sigmoid colon, without evidence of diverticulitis. 9. Enlarged prostate noted. 10. Scattered calcification along the abdominal aorta and branches.   Electronically Signed   By: Garald Balding M.D.   On: 06/28/2014 22:15   Dg Chest 2 View  06/28/2014   CLINICAL DATA:  Diffuse abdominal pain.  EXAM: CHEST  2 VIEW  COMPARISON:  Chest radiograph performed 04/11/2013  FINDINGS: The lungs are well-aerated. Mild left basilar airspace opacity may reflect atelectasis or possibly pneumonia. Mild vascular congestion is noted. There is no evidence of pleural effusion or pneumothorax.  The heart is mildly enlarged. The patient is status post median sternotomy. An aortic valve replacement is noted. No acute osseous abnormalities are seen.  IMPRESSION: 1. Mild left basilar airspace opacity may reflect atelectasis or possibly mild pneumonia. 2. Mild vascular congestion and mild cardiomegaly.   Electronically Signed   By: Garald Balding M.D.   On: 06/28/2014 23:08   Assessment & Plan by Problem: Abdominal pain Improved, unclear etiology.  CT of abdomen and pelvis did not show any acute findings.  Send for C. difficile and stool culture if any episodes of diarrhea.  Continue to monitor.  Fever Unclear etiology.  Blood cultures x2 drawn and sent.  Patient has been started on broad-spectrum antibiotics of vancomycin and Zosyn, which will be continued.  As indicated above center C. difficile and stool culture if any diarrhea.  Patient has had history of Escherichia coli sepsis, above antibiotics should have adequate coverage.  Chest x-ray showed mild left basilar airspace opacity which could be due to atelectasis versus mild  pneumonia, patient does not have any respiratory symptoms including cough or shortness of breath,  more likely due to atelectasis.  Antibiotics as indicated above.  If cultures are negative and if afebrile rapidly de-escalated antibiotics.  Elevated liver function tests Unclear etiology.  May be due to acute abdominal pain.  CT is negative, patient is status post cholecystectomy.  Recheck liver function tests in the morning, if it continues to rise may consider abdominal ultrasound for further evaluation.  Acute hepatitis panel ordered in the emergency department.  Hypertension Stable.  Continue home antihypertensive medications.  Type 2 diabetes Diet controlled.  Not on any diabetic medications at home.  Diabetic diet.  Sliding scale insulin.  Coronary artery disease and status post aortic valve replacement Continue home medications.  Stable.  Chronic systolic congestive heart failure Patient appears compensated.  Continue to monitor.  Patient is not on ACE or ARB due to chronic kidney disease stage III.  Chronic kidney disease stage III Renal function at baseline.  Continue to monitor.  Anemia Due to chronic kidney disease.  Hemoglobin stable.  Hyperlipidemia Stable.  Prophylaxis Subcutaneous heparin.  CODE STATUS DO NOT RESUSCITATE/DO NOT INTUBATE.  This was discussed with the patient and family at the time of admission.  Disposition Admit the patient to medical bed as inpatient.  Time spent on admission, talking to the patient, and coordinating care was: 50 mins.  Carsten Carstarphen A, MD 06/28/2014, 11:58 PM

## 2014-06-28 NOTE — ED Notes (Signed)
Pt. reports generalize/diffused abdominal pain " cramping " onset this evening , denies nausea/vomitting or diarrhea , no fever or chills. Last BM today .

## 2014-06-29 ENCOUNTER — Telehealth: Payer: Self-pay | Admitting: Physician Assistant

## 2014-06-29 ENCOUNTER — Encounter (HOSPITAL_COMMUNITY): Payer: Self-pay | Admitting: Internal Medicine

## 2014-06-29 DIAGNOSIS — I5022 Chronic systolic (congestive) heart failure: Secondary | ICD-10-CM

## 2014-06-29 LAB — COMPREHENSIVE METABOLIC PANEL
ALK PHOS: 386 U/L — AB (ref 39–117)
ALT: 292 U/L — ABNORMAL HIGH (ref 0–53)
AST: 342 U/L — ABNORMAL HIGH (ref 0–37)
Albumin: 3.1 g/dL — ABNORMAL LOW (ref 3.5–5.2)
Anion gap: 13 (ref 5–15)
BUN: 41 mg/dL — ABNORMAL HIGH (ref 6–23)
CO2: 31 mEq/L (ref 19–32)
Calcium: 8.9 mg/dL (ref 8.4–10.5)
Chloride: 99 mEq/L (ref 96–112)
Creatinine, Ser: 2.25 mg/dL — ABNORMAL HIGH (ref 0.50–1.35)
GFR, EST AFRICAN AMERICAN: 29 mL/min — AB (ref >90)
GFR, EST NON AFRICAN AMERICAN: 25 mL/min — AB (ref >90)
GLUCOSE: 132 mg/dL — AB (ref 70–99)
POTASSIUM: 4.2 meq/L (ref 3.7–5.3)
Sodium: 143 mEq/L (ref 137–147)
Total Bilirubin: 2.7 mg/dL — ABNORMAL HIGH (ref 0.3–1.2)
Total Protein: 6.4 g/dL (ref 6.0–8.3)

## 2014-06-29 LAB — HEPATITIS PANEL, ACUTE
HCV AB: NEGATIVE
HEP A IGM: NONREACTIVE
HEP B C IGM: NONREACTIVE
Hepatitis B Surface Ag: NEGATIVE

## 2014-06-29 LAB — CBC
HCT: 31.7 % — ABNORMAL LOW (ref 39.0–52.0)
Hemoglobin: 10.2 g/dL — ABNORMAL LOW (ref 13.0–17.0)
MCH: 32.1 pg (ref 26.0–34.0)
MCHC: 32.2 g/dL (ref 30.0–36.0)
MCV: 99.7 fL (ref 78.0–100.0)
PLATELETS: 120 10*3/uL — AB (ref 150–400)
RBC: 3.18 MIL/uL — ABNORMAL LOW (ref 4.22–5.81)
RDW: 14.1 % (ref 11.5–15.5)
WBC: 11.2 10*3/uL — ABNORMAL HIGH (ref 4.0–10.5)

## 2014-06-29 LAB — GLUCOSE, CAPILLARY
GLUCOSE-CAPILLARY: 139 mg/dL — AB (ref 70–99)
Glucose-Capillary: 134 mg/dL — ABNORMAL HIGH (ref 70–99)
Glucose-Capillary: 150 mg/dL — ABNORMAL HIGH (ref 70–99)
Glucose-Capillary: 151 mg/dL — ABNORMAL HIGH (ref 70–99)
Glucose-Capillary: 201 mg/dL — ABNORMAL HIGH (ref 70–99)

## 2014-06-29 MED ORDER — ADULT MULTIVITAMIN W/MINERALS CH
1.0000 | ORAL_TABLET | Freq: Every day | ORAL | Status: DC
  Administered 2014-06-29 – 2014-07-09 (×11): 1 via ORAL
  Filled 2014-06-29 (×12): qty 1

## 2014-06-29 MED ORDER — ACETAMINOPHEN 325 MG PO TABS
650.0000 mg | ORAL_TABLET | Freq: Four times a day (QID) | ORAL | Status: DC | PRN
Start: 2014-06-29 — End: 2014-07-09

## 2014-06-29 MED ORDER — PANTOPRAZOLE SODIUM 40 MG PO TBEC
40.0000 mg | DELAYED_RELEASE_TABLET | Freq: Every day | ORAL | Status: DC
  Administered 2014-06-29 – 2014-07-09 (×11): 40 mg via ORAL
  Filled 2014-06-29 (×10): qty 1

## 2014-06-29 MED ORDER — TAMSULOSIN HCL 0.4 MG PO CAPS
0.4000 mg | ORAL_CAPSULE | Freq: Every day | ORAL | Status: DC
  Administered 2014-06-29 – 2014-07-08 (×10): 0.4 mg via ORAL
  Filled 2014-06-29 (×11): qty 1

## 2014-06-29 MED ORDER — FUROSEMIDE 40 MG PO TABS
40.0000 mg | ORAL_TABLET | Freq: Two times a day (BID) | ORAL | Status: DC
  Administered 2014-06-29 (×2): 40 mg via ORAL
  Filled 2014-06-29 (×3): qty 1

## 2014-06-29 MED ORDER — PRENATAL 27-0.8 MG PO TABS
1.0000 | ORAL_TABLET | Freq: Every day | ORAL | Status: DC
  Administered 2014-06-29 – 2014-07-08 (×9): 1 via ORAL
  Filled 2014-06-29 (×11): qty 1

## 2014-06-29 MED ORDER — CARVEDILOL 6.25 MG PO TABS
6.2500 mg | ORAL_TABLET | Freq: Two times a day (BID) | ORAL | Status: DC
  Administered 2014-07-02 (×2): 6.25 mg via ORAL
  Filled 2014-06-29 (×10): qty 1

## 2014-06-29 MED ORDER — ONDANSETRON HCL 4 MG PO TABS: 4.0000 mg | ORAL_TABLET | Freq: Four times a day (QID) | ORAL | Status: DC | PRN

## 2014-06-29 MED ORDER — ONDANSETRON HCL 4 MG/2ML IJ SOLN: 4.0000 mg | Freq: Four times a day (QID) | INTRAMUSCULAR | Status: DC | PRN

## 2014-06-29 MED ORDER — INSULIN ASPART 100 UNIT/ML ~~LOC~~ SOLN
0.0000 [IU] | Freq: Every day | SUBCUTANEOUS | Status: DC
  Administered 2014-06-29 – 2014-07-08 (×3): 2 [IU] via SUBCUTANEOUS

## 2014-06-29 MED ORDER — FUROSEMIDE 40 MG PO TABS
40.0000 mg | ORAL_TABLET | Freq: Every day | ORAL | Status: DC
  Administered 2014-06-30 – 2014-07-09 (×10): 40 mg via ORAL
  Filled 2014-06-29 (×10): qty 1

## 2014-06-29 MED ORDER — VITAMIN D3 25 MCG (1000 UNIT) PO TABS
1000.0000 [IU] | ORAL_TABLET | Freq: Every day | ORAL | Status: DC
  Administered 2014-06-29 – 2014-07-08 (×9): 1000 [IU] via ORAL
  Filled 2014-06-29 (×11): qty 1

## 2014-06-29 MED ORDER — LATANOPROST 0.005 % OP SOLN
1.0000 [drp] | Freq: Every day | OPHTHALMIC | Status: DC
  Administered 2014-06-29 – 2014-07-08 (×10): 1 [drp] via OPHTHALMIC
  Filled 2014-06-29: qty 2.5

## 2014-06-29 MED ORDER — CARVEDILOL 25 MG PO TABS
25.0000 mg | ORAL_TABLET | Freq: Two times a day (BID) | ORAL | Status: DC
  Filled 2014-06-29 (×3): qty 1

## 2014-06-29 MED ORDER — INSULIN ASPART 100 UNIT/ML ~~LOC~~ SOLN
0.0000 [IU] | Freq: Three times a day (TID) | SUBCUTANEOUS | Status: DC
  Administered 2014-06-29 (×2): 1 [IU] via SUBCUTANEOUS
  Administered 2014-06-29 – 2014-06-30 (×2): 2 [IU] via SUBCUTANEOUS
  Administered 2014-07-01: 1 [IU] via SUBCUTANEOUS
  Administered 2014-07-01 (×2): 2 [IU] via SUBCUTANEOUS
  Administered 2014-07-02: 3 [IU] via SUBCUTANEOUS
  Administered 2014-07-04: 2 [IU] via SUBCUTANEOUS
  Administered 2014-07-04 – 2014-07-05 (×3): 1 [IU] via SUBCUTANEOUS
  Administered 2014-07-05 – 2014-07-06 (×2): 2 [IU] via SUBCUTANEOUS
  Administered 2014-07-06 – 2014-07-07 (×3): 1 [IU] via SUBCUTANEOUS
  Administered 2014-07-07: 3 [IU] via SUBCUTANEOUS
  Administered 2014-07-07 – 2014-07-09 (×3): 1 [IU] via SUBCUTANEOUS
  Administered 2014-07-09: 3 [IU] via SUBCUTANEOUS

## 2014-06-29 MED ORDER — ISOSORBIDE MONONITRATE ER 60 MG PO TB24
60.0000 mg | ORAL_TABLET | Freq: Every day | ORAL | Status: DC
  Administered 2014-06-29 – 2014-07-09 (×11): 60 mg via ORAL
  Filled 2014-06-29 (×11): qty 1

## 2014-06-29 MED ORDER — ACETAMINOPHEN 650 MG RE SUPP: 650.0000 mg | Freq: Four times a day (QID) | RECTAL | Status: DC | PRN

## 2014-06-29 MED ORDER — PIPERACILLIN-TAZOBACTAM 3.375 G IVPB
3.3750 g | Freq: Three times a day (TID) | INTRAVENOUS | Status: DC
  Administered 2014-06-29 – 2014-06-30 (×3): 3.375 g via INTRAVENOUS
  Filled 2014-06-29 (×6): qty 50

## 2014-06-29 MED ORDER — ASPIRIN EC 325 MG PO TBEC
325.0000 mg | DELAYED_RELEASE_TABLET | Freq: Every day | ORAL | Status: DC
  Administered 2014-06-29 – 2014-07-09 (×10): 325 mg via ORAL
  Filled 2014-06-29 (×11): qty 1

## 2014-06-29 MED ORDER — VANCOMYCIN HCL IN DEXTROSE 750-5 MG/150ML-% IV SOLN: 750.0000 mg | INTRAVENOUS | Status: DC

## 2014-06-29 MED ORDER — HEPARIN SODIUM (PORCINE) 5000 UNIT/ML IJ SOLN
5000.0000 [IU] | Freq: Three times a day (TID) | INTRAMUSCULAR | Status: DC
  Administered 2014-06-29 – 2014-07-09 (×26): 5000 [IU] via SUBCUTANEOUS
  Filled 2014-06-29 (×36): qty 1

## 2014-06-29 MED ORDER — HYDRALAZINE HCL 25 MG PO TABS
25.0000 mg | ORAL_TABLET | Freq: Three times a day (TID) | ORAL | Status: DC
  Administered 2014-06-29 – 2014-07-01 (×6): 25 mg via ORAL
  Filled 2014-06-29 (×9): qty 1

## 2014-06-29 NOTE — ED Notes (Signed)
Transporting patient to new room assignment. 

## 2014-06-29 NOTE — Progress Notes (Signed)
PATIENT DETAILS Name: [name] [name] Age: 80 y.o. Sex: male Date of Birth: 17-Jan-1926 Admit Date: 06/28/2014 Admitting Physician Bynum Bellows, MD ZOX:[id number], [name], DO  Subjective: No further abdominal pain. Afebrile overnight  Assessment/Plan: Principal Problem:   Abdominal pain - Associated with fever and elevated LFTs. - Etiology not clear, lipase levels within normal limit. He is status post cholecystectomy in the remote past, and get a MRI of the abdomen further evaluation. Repeat lipase levels. Supportive care  Active Problems: Acute hepatitis - Both AST/ALT and alkaline phosphatase elevated, bilirubin elevated at 2.7. - Acute hepatitis serology negative. Her abdominal pain, ? Transient cholangitis-? did he passed a stone-but with history of remote cholecystectomy. ? gram-negative bacteremia (has history of bacteremia asked her) - Check MRCP- follow LFTs - Continue with empiric Zosyn-D2, stop vancomycin. Await blood cultures.  Hypertension  Stable. Continue Coreg, Lasix, hydralazine and Imdur.   Type 2 diabetes  Diet controlled. Not on any diabetic medications at home. Diabetic diet. CBGs stable with Sliding scale insulin.   Coronary artery disease and status post aortic valve replacement  Continue home medications. Stable.   Chronic systolic congestive heart failure  Patient appears compensated. Continue to monitor. Patient is not on ACE or ARB due to chronic kidney disease stage III.   Chronic kidney disease stage III  Renal function at baseline. Continue to monitor.   Anemia  Due to chronic kidney disease. Hemoglobin stable.   Hyperlipidemia  Stable.   DVT Prophylaxis  Subcutaneous heparin.   CODE STATUS  DO NOT RESUSCITATE/DO NOT INTUBATE. This was discussed with the patient and family at the time of admission.  Family Communication Spouse and daughter at bedside  Procedures:  None  CONSULTS:  None  Time spent 40 minutes-which  includes 50% of the time with face-to-face with patient/ family and coordinating care related to the above assessment and plan.    MEDICATIONS: Scheduled Meds: . aspirin EC  325 mg Oral Daily  . carvedilol  25 mg Oral BID WC  . cholecalciferol  1,000 Units Oral Q supper  . furosemide  40 mg Oral BID WC  . heparin  5,000 Units Subcutaneous 3 times per day  . hydrALAZINE  25 mg Oral TID  . insulin aspart  0-5 Units Subcutaneous QHS  . insulin aspart  0-9 Units Subcutaneous TID WC  . isosorbide mononitrate  60 mg Oral Daily  . latanoprost  1 drop Both Eyes QHS  . multivitamin with minerals  1 tablet Oral Daily  . multivitamin-prenatal  1 tablet Oral Q supper  . pantoprazole  40 mg Oral Daily  . piperacillin-tazobactam (ZOSYN)  IV  3.375 g Intravenous Q8H  . tamsulosin  0.4 mg Oral QPC supper   Continuous Infusions:  PRN Meds:.acetaminophen, acetaminophen, ondansetron (ZOFRAN) IV, ondansetron  Antibiotics: Anti-infectives   Start     Dose/Rate Route Frequency Ordered Stop   06/30/14 0000  vancomycin (VANCOCIN) IVPB 750 mg/150 ml premix  Status:  Discontinued     750 mg 150 mL/hr over 60 Minutes Intravenous Every 24 hours 06/29/14 0043 06/29/14 1047   06/29/14 0800  piperacillin-tazobactam (ZOSYN) IVPB 3.375 g     3.375 g 12.5 mL/hr over 240 Minutes Intravenous Every 8 hours 06/29/14 0043     06/28/14 2330  cefTRIAXone (ROCEPHIN) 1 g in dextrose 5 % 50 mL IVPB  Status:  Discontinued     1 g 100 mL/hr over 30 Minutes Intravenous  Once 06/28/14 2315  06/28/14 2317   06/28/14 2330  azithromycin (ZITHROMAX) 500 mg in dextrose 5 % 250 mL IVPB  Status:  Discontinued     500 mg 250 mL/hr over 60 Minutes Intravenous  Once 06/28/14 2315 06/28/14 2317   06/28/14 2330  piperacillin-tazobactam (ZOSYN) IVPB 3.375 g     3.375 g 12.5 mL/hr over 240 Minutes Intravenous  Once 06/28/14 2317 06/29/14 0744   06/28/14 2330  vancomycin (VANCOCIN) IVPB 1000 mg/200 mL premix     1,000 mg 200 mL/hr  over 60 Minutes Intravenous  Once 06/28/14 2317 06/29/14 0000       PHYSICAL EXAM: Vital signs in last 24 hours: Filed Vitals:   06/29/14 0039 06/29/14 0506 06/29/14 0843 06/29/14 0952  BP: 125/59 111/60 109/47 145/63  Pulse: 84 65 62 57  Temp: 99.7 F (37.6 C) 98.3 F (36.8 C)    TempSrc: Oral Oral    Resp: 16 16    Height: 5\' 8"  (1.727 m)     Weight: 65.1 kg (143 lb 8.3 oz)     SpO2: 91% 97%      Weight change:  Filed Weights   06/28/14 2045 06/29/14 0039  Weight: 62.852 kg (138 lb 9 oz) 65.1 kg (143 lb 8.3 oz)   Body mass index is 21.83 kg/(m^2).   Gen Exam: Awake and alert with clear speech.   Neck: Supple, No JVD.   Chest: B/L Clear.   CVS: S1 S2 Regular, no murmurs.  Abdomen: soft, BS +, non tender, non distended.  Extremities: no edema, lower extremities warm to touch. Neurologic: Non Focal.   Skin: No Rash.   Wounds: N/A.   Intake/Output from previous day: No intake or output data in the 24 hours ending 06/29/14 1049   LAB RESULTS: CBC  Recent Labs Lab 06/28/14 2058 06/29/14 0533  WBC 7.1 11.2*  HGB 12.1* 10.2*  HCT 37.5* 31.7*  PLT 140* 120*  MCV 101.9* 99.7  MCH 32.9 32.1  MCHC 32.3 32.2  RDW 13.9 14.1  LYMPHSABS 1.0  --   MONOABS 0.2  --   EOSABS 0.1  --   BASOSABS 0.0  --     Chemistries   Recent Labs Lab 06/28/14 2058 06/29/14 0533  NA 144 143  K 4.7 4.2  CL 100 99  CO2 30 31  GLUCOSE 221* 132*  BUN 36* 41*  CREATININE 2.08* 2.25*  CALCIUM 9.5 8.9    CBG:  Recent Labs Lab 06/29/14 0041 06/29/14 0806  GLUCAP 201* 139*    GFR Estimated Creatinine Clearance: 21.7 ml/min (by C-G formula based on Cr of 2.25).  Coagulation profile No results found for this basename: INR, PROTIME,  in the last 168 hours  Cardiac Enzymes No results found for this basename: CK, CKMB, TROPONINI, MYOGLOBIN,  in the last 168 hours  No components found with this basename: POCBNP,  No results found for this basename: DDIMER,  in the last  72 hours No results found for this basename: HGBA1C,  in the last 72 hours No results found for this basename: CHOL, HDL, LDLCALC, TRIG, CHOLHDL, LDLDIRECT,  in the last 72 hours No results found for this basename: TSH, T4TOTAL, FREET3, T3FREE, THYROIDAB,  in the last 72 hours No results found for this basename: VITAMINB12, FOLATE, FERRITIN, TIBC, IRON, RETICCTPCT,  in the last 72 hours  Recent Labs  06/28/14 2058  LIPASE 34    Urine Studies No results found for this basename: UACOL, UAPR, USPG, UPH, UTP, UGL, UKET, UBIL,  UHGB, UNIT, UROB, ULEU, UEPI, UWBC, URBC, UBAC, CAST, CRYS, UCOM, BILUA,  in the last 72 hours  MICROBIOLOGY: No results found for this or any previous visit (from the past 240 hour(s)).  RADIOLOGY STUDIES/RESULTS: Ct Abdomen Pelvis Wo Contrast  06/28/2014   CLINICAL DATA:  Generalized abdominal pain and cramping.  EXAM: CT ABDOMEN AND PELVIS WITHOUT CONTRAST  TECHNIQUE: Multidetector CT imaging of the abdomen and pelvis was performed following the standard protocol without IV contrast.  COMPARISON:  None.  FINDINGS: Mild nodular densities at the left lung base are nonspecific but could reflect atelectasis. Mild associated scarring is seen. Diffuse coronary artery calcifications are seen. Calcification is also noted at the aortic valve. The patient is status post median sternotomy.  There is partial atrophy of the right hepatic lobe. The liver and spleen are otherwise grossly unremarkable in appearance. The patient is status post cholecystectomy. The pancreas is atrophic; the adrenal glands are unremarkable in appearance.  Mild bilateral renal atrophy is noted, worse on the left. Mild nonspecific perinephric stranding is seen bilaterally. A small 1.0 cm exophytic cyst is noted arising at the lower pole of the right kidney. The kidneys are otherwise unremarkable. There is no evidence of hydronephrosis. No renal or ureteral stones are seen.  No free fluid is identified. There is a  moderate to large right inguinal hernia, containing a relatively long segment of ileum, without evidence for obstruction or strangulation at this time. The small bowel is otherwise unremarkable in appearance. A small left inguinal hernia is seen, containing only fat. The stomach is within normal limits. No acute vascular abnormalities are seen. Scattered calcification is seen along the abdominal aorta and its branches.  Postoperative change is noted at the right lower quadrant. The appendix is not definitely seen. Scattered diverticulosis is noted along the descending and proximal sigmoid colon, without evidence of diverticulitis.  The bladder is mildly distended and grossly unremarkable in appearance. The prostate is enlarged, measuring 5.0 cm in transverse dimension, with minimal calcification. No inguinal lymphadenopathy is seen.  No acute osseous abnormalities are identified. Degenerative change is noted at L5, with a prominent Schmorl's node and associated sclerotic change.  IMPRESSION: 1. No acute abnormality seen to explain the patient's symptoms. 2. Moderate to large right inguinal hernia, containing a relatively long segment of ileum, without evidence for obstruction or strangulation at this time. 3. Small left inguinal hernia, containing only fat. 4. Mild bilateral renal atrophy, worse on the left. Small right renal cyst seen. 5. Partial atrophy of the right hepatic lobe. 6. Scattered nodular densities at the left lung base are nonspecific, but could reflect atelectasis. Mild associated scarring seen. 7. Diffuse coronary artery calcifications noted. Calcification noted at the mitral valve. 8. Scattered diverticulosis along the descending and proximal sigmoid colon, without evidence of diverticulitis. 9. Enlarged prostate noted. 10. Scattered calcification along the abdominal aorta and branches.   Electronically Signed   By: Garald Balding M.D.   On: 06/28/2014 22:15   Dg Chest 2 View  06/28/2014    CLINICAL DATA:  Diffuse abdominal pain.  EXAM: CHEST  2 VIEW  COMPARISON:  Chest radiograph performed 04/11/2013  FINDINGS: The lungs are well-aerated. Mild left basilar airspace opacity may reflect atelectasis or possibly pneumonia. Mild vascular congestion is noted. There is no evidence of pleural effusion or pneumothorax.  The heart is mildly enlarged. The patient is status post median sternotomy. An aortic valve replacement is noted. No acute osseous abnormalities are seen.  IMPRESSION: 1.  Mild left basilar airspace opacity may reflect atelectasis or possibly mild pneumonia. 2. Mild vascular congestion and mild cardiomegaly.   Electronically Signed   By: Garald Balding M.D.   On: 06/28/2014 23:08    Oren Binet, MD  [facility] Pager:[phone]  If 7PM-7AM, please contact night-coverage [email or web address] Password [email or web address] 06/29/2014, 10:49 AM   LOS: 1 day   **Disclaimer: This note may have been dictated with voice recognition software. Similar sounding words can inadvertently be transcribed and this note may contain transcription errors which may not have been corrected upon publication of note.**

## 2014-06-29 NOTE — Progress Notes (Signed)
ANTIBIOTIC CONSULT NOTE - INITIAL  Pharmacy Consult for Vancocin and Zosyn Indication: fever of unclear etiology  Allergies  Allergen Reactions  . Statins Other (See Comments)    Pain/weakness in legs    Patient Measurements: Height: 5\' 8"  (172.7 cm) Weight: 138 lb 9 oz (62.852 kg) IBW/kg (Calculated) : 68.4  Vital Signs: Temp: 103 F (39.4 C) (07/19 2208) Temp src: Rectal (07/19 2208) BP: 141/53 mmHg (07/19 2345) Pulse Rate: 90 (07/19 2357)  Labs:  Recent Labs  06/28/14 2058  WBC 7.1  HGB 12.1*  PLT 140*  CREATININE 2.08*   Estimated Creatinine Clearance: 22.7 ml/min (by C-G formula based on Cr of 2.08).    Medical History: Past Medical History  Diagnosis Date  . History of colon cancer     s/p colon resection  . Aortic stenosis     a. s/p AVR with 67mm Edwards pericardial valve 02/07/12 - post-op course complicated by pleural effusion requring thoracentesis/leg cellulitis 03/2012.  Marland Kitchen CAD (coronary artery disease)     a. s/p NSTEMI 12/2011:  LHC - Ostial left main 20%, ostial LAD 50%, mid 60-70%, ostial D1 40% and mid 40%, D2 70%, ostial circumflex occluded, ostial RCA 80-90%, LVEDP was 42. b.  s/p CABG x 3 at time of AVR (LiMA-LAD, SVG-2nd daigonal, SVG-PDA) 02/07/12 (post-op course noted above).  . Ischemic cardiomyopathy   . Chronic systolic heart failure     a. TEE 01/2012: EF 25-30%, diffuse hypokinesis. b. Not on ACEI due to renal insufficiency.;  c. follow up  echo 08/06/12: EF 25%, mod diast dysfxn, AVR ok, mild MR, mod LAE, mild RAE, mild to mod RV systolic dysfunction  . HLD (hyperlipidemia)   . GERD (gastroesophageal reflux disease)   . Chronic kidney disease   . Pleural effusion     a. post-operatively after AVR/CABG s/p thoracentesis 03/2012 yielding 1L serosanguinous fluid.  . Diabetes mellitus     borderline  . Blood transfusion     NO REACTION TO TRANSFUSION  . Cellulitis     a. RLE cellulitis 2 months post-operatively after AVR/CABG - serratia  marcessans, tx with I&D/antibiotics  . Mitral regurgitation     Moderate by TEE 01/2012  . Flash pulmonary edema     Post-cath 12/2011, went into acute pulm edema requiring IV lasix and intubation  . Baker's cyst 07/23/12    Incidental finding of LE venous dopplers  . Cataract     right eye, hx of  . HTN (hypertension)     primary, Dr. Jose Persia  . Myocardial infarction 12/2011  . CHF (congestive heart failure)   . Stroke 10/01    RIght Leg weakness  . Cancer '90's    Colon  . Anemia     Medications:  Prescriptions prior to admission  Medication Sig Dispense Refill  . amoxicillin (AMOXIL) 500 MG capsule Take 2,000 mg by mouth See admin instructions. Take 4 capsules (2000 mg) prior to dental appointment      . aspirin EC 325 MG tablet Take 325 mg by mouth daily.       . carvedilol (COREG) 25 MG tablet Take 1 tablet (25 mg total) by mouth 2 (two) times daily with a meal.  180 tablet  3  . cholecalciferol (VITAMIN D) 1000 UNITS tablet Take 1,000 Units by mouth daily with supper.       . furosemide (LASIX) 40 MG tablet Take 40 mg by mouth 2 (two) times daily. At breakfast and at lunch      .  hydrALAZINE (APRESOLINE) 25 MG tablet Take 1 tablet (25 mg total) by mouth 3 (three) times daily.  270 tablet  2  . isosorbide mononitrate (IMDUR) 60 MG 24 hr tablet Take 1 tablet (60 mg total) by mouth daily.  30 tablet  6  . Multiple Vitamin (MULTIVITAMIN WITH MINERALS) TABS tablet Take 1 tablet by mouth daily.      . nitroGLYCERIN (NITROSTAT) 0.4 MG SL tablet Place 0.4 mg under the tongue every 5 (five) minutes as needed for chest pain.      Marland Kitchen omeprazole (PRILOSEC) 20 MG capsule Take 20 mg by mouth daily.       . Prenatal Vit-Fe Fumarate-FA (MULTIVITAMIN-PRENATAL) 27-0.8 MG TABS Take 1 tablet by mouth daily with supper.       . tamsulosin (FLOMAX) 0.4 MG CAPS capsule Take 0.4 mg by mouth daily after supper. Take 30 minutes after eating      . Travoprost, BAK Free, (TRAVATAN) 0.004 % SOLN  ophthalmic solution Place 1 drop into both eyes at bedtime.        Scheduled:  . aspirin EC  325 mg Oral Daily  . carvedilol  25 mg Oral BID WC  . cholecalciferol  1,000 Units Oral Q supper  . furosemide  40 mg Oral BID  . heparin  5,000 Units Subcutaneous 3 times per day  . hydrALAZINE  25 mg Oral TID  . insulin aspart  0-5 Units Subcutaneous QHS  . insulin aspart  0-9 Units Subcutaneous TID WC  . isosorbide mononitrate  60 mg Oral Daily  . latanoprost  1 drop Both Eyes QHS  . multivitamin with minerals  1 tablet Oral Daily  . multivitamin-prenatal  1 tablet Oral Q supper  . pantoprazole  40 mg Oral Daily  . piperacillin-tazobactam (ZOSYN)  IV  3.375 g Intravenous Once  . tamsulosin  0.4 mg Oral QPC supper    Assessment: 80yo male c/o generalized diffuse abdominal pain/cramping, has complex PMH, found to be febrile without leukocytosis, CXR reveals atelectasis vs mild PNA, abdomen CT negative, to begin IV ABX.  Goal of Therapy:  Vancomycin trough level 15-20 mcg/ml  Plan:  Rec'd vanc 1g in ED; will continue with vancomycin 750mg  IV Q24H and Zosyn 3.375g IV Q8H and monitor CBC, SCr, Cx, levels prn.  Wynona Neat, PharmD, BCPS  06/29/2014,12:37 AM

## 2014-06-29 NOTE — Progress Notes (Signed)
MD Schorr notified of pt's blood culture result via text page. No orders at this time.

## 2014-06-29 NOTE — Progress Notes (Signed)
Called ED for report on patient.

## 2014-06-29 NOTE — Progress Notes (Signed)
Pt's BP has been low. Ghimire MD notified and aware.

## 2014-06-29 NOTE — Progress Notes (Signed)
NURSING PROGRESS NOTE  Jakarius Flamenco [id number] Admission Data: 06/29/2014 3:38 AM Attending Provider: Bynum Bellows, MD JJH:ERDE, [name], DO Code Status: DNR  Gerrell Tabet is a 80 y.o. male patient admitted from ED:  -No acute distress noted.  -No complaints of shortness of breath.  -No complaints of chest pain.   Blood pressure 125/59, pulse 84, temperature 99.7 F (37.6 C), temperature source Oral, resp. rate 16, height 5\' 8"  (1.727 m), weight 65.1 kg (143 lb 8.3 oz), SpO2 91.00%.   IV Fluids:  IV in place, occlusive dsg intact without redness, IV cath forearm right, condition patent and no redness none.   Allergies:  Statins  Past Medical History:   has a past medical history of History of colon cancer; Aortic stenosis; CAD (coronary artery disease); Ischemic cardiomyopathy; Chronic systolic heart failure; HLD (hyperlipidemia); GERD (gastroesophageal reflux disease); Chronic kidney disease; Pleural effusion; Diabetes mellitus; Blood transfusion; Cellulitis; Mitral regurgitation; Flash pulmonary edema; Baker's cyst (07/23/12); Cataract; HTN (hypertension); Myocardial infarction (12/2011); CHF (congestive heart failure); Stroke (10/01); Cancer ('90's); and Anemia.  Past Surgical History:   has past surgical history that includes Colon resection (1996); Esophageal dilation; Colonoscopy; Cataract extraction; Cardiac catheterization; Tonsillectomy; Aortic valve replacement (02/07/2012); Coronary artery bypass graft (02/07/2012); Chest tube insertion (07/24/2012); Talc pleurodesis (08/30/2012); Talc pleurodesis (09/12/2012); Removal of pleural drainage catheter (09/27/2012); and Eye surgery (2009).  Social History:   reports that he has never smoked. He has never used smokeless tobacco. He reports that he does not drink alcohol or use illicit drugs.  Skin: WDL  Patient/Family oriented to room. Information packet given to patient/family. Admission inpatient armband information verified with  patient/family to include name and date of birth and placed on patient arm. Side rails up x 2, fall assessment and education completed with patient/family. Patient/family able to verbalize understanding of risk associated with falls and verbalized understanding to call for assistance before getting out of bed. Call light within reach. Patient/family able to voice and demonstrate understanding of unit orientation instructions.

## 2014-06-29 NOTE — Progress Notes (Signed)
Utilization review completed.  

## 2014-06-29 NOTE — Telephone Encounter (Signed)
Pt's wife cld states pt is in the hospital and cancelled apt for July states per Dr. in the hospital pt will be given his injection in the hospital if needed...Marland KitchenMarland KitchenKJ

## 2014-06-30 DIAGNOSIS — B9689 Other specified bacterial agents as the cause of diseases classified elsewhere: Secondary | ICD-10-CM

## 2014-06-30 DIAGNOSIS — R7881 Bacteremia: Secondary | ICD-10-CM

## 2014-06-30 DIAGNOSIS — N179 Acute kidney failure, unspecified: Secondary | ICD-10-CM

## 2014-06-30 DIAGNOSIS — N189 Chronic kidney disease, unspecified: Secondary | ICD-10-CM

## 2014-06-30 LAB — BASIC METABOLIC PANEL
Anion gap: 13 (ref 5–15)
BUN: 46 mg/dL — AB (ref 6–23)
CHLORIDE: 96 meq/L (ref 96–112)
CO2: 29 mEq/L (ref 19–32)
CREATININE: 2.82 mg/dL — AB (ref 0.50–1.35)
Calcium: 8.8 mg/dL (ref 8.4–10.5)
GFR calc Af Amer: 22 mL/min — ABNORMAL LOW (ref >90)
GFR calc non Af Amer: 19 mL/min — ABNORMAL LOW (ref >90)
Glucose, Bld: 109 mg/dL — ABNORMAL HIGH (ref 70–99)
POTASSIUM: 3.9 meq/L (ref 3.7–5.3)
Sodium: 138 mEq/L (ref 137–147)

## 2014-06-30 LAB — URINE CULTURE
COLONY COUNT: NO GROWTH
CULTURE: NO GROWTH

## 2014-06-30 LAB — HEPATIC FUNCTION PANEL
ALT: 185 U/L — ABNORMAL HIGH (ref 0–53)
AST: 104 U/L — ABNORMAL HIGH (ref 0–37)
Albumin: 2.6 g/dL — ABNORMAL LOW (ref 3.5–5.2)
Alkaline Phosphatase: 296 U/L — ABNORMAL HIGH (ref 39–117)
BILIRUBIN INDIRECT: 0.5 mg/dL (ref 0.3–0.9)
Bilirubin, Direct: 2.8 mg/dL — ABNORMAL HIGH (ref 0.0–0.3)
Total Bilirubin: 3.3 mg/dL — ABNORMAL HIGH (ref 0.3–1.2)
Total Protein: 5.9 g/dL — ABNORMAL LOW (ref 6.0–8.3)

## 2014-06-30 LAB — CBC WITH DIFFERENTIAL/PLATELET
BASOS ABS: 0 10*3/uL (ref 0.0–0.1)
BASOS PCT: 0 % (ref 0–1)
EOS ABS: 0.2 10*3/uL (ref 0.0–0.7)
EOS PCT: 2 % (ref 0–5)
HEMATOCRIT: 30.5 % — AB (ref 39.0–52.0)
Hemoglobin: 10.1 g/dL — ABNORMAL LOW (ref 13.0–17.0)
Lymphocytes Relative: 16 % (ref 12–46)
Lymphs Abs: 1.2 10*3/uL (ref 0.7–4.0)
MCH: 32.6 pg (ref 26.0–34.0)
MCHC: 33.1 g/dL (ref 30.0–36.0)
MCV: 98.4 fL (ref 78.0–100.0)
MONO ABS: 0.4 10*3/uL (ref 0.1–1.0)
Monocytes Relative: 6 % (ref 3–12)
NEUTROS ABS: 5.4 10*3/uL (ref 1.7–7.7)
Neutrophils Relative %: 75 % (ref 43–77)
Platelets: 102 10*3/uL — ABNORMAL LOW (ref 150–400)
RBC: 3.1 MIL/uL — ABNORMAL LOW (ref 4.22–5.81)
RDW: 14.5 % (ref 11.5–15.5)
WBC: 7.2 10*3/uL (ref 4.0–10.5)

## 2014-06-30 LAB — GLUCOSE, CAPILLARY
Glucose-Capillary: 110 mg/dL — ABNORMAL HIGH (ref 70–99)
Glucose-Capillary: 159 mg/dL — ABNORMAL HIGH (ref 70–99)
Glucose-Capillary: 115 mg/dL — ABNORMAL HIGH (ref 70–99)
Glucose-Capillary: 156 mg/dL — ABNORMAL HIGH (ref 70–99)

## 2014-06-30 LAB — LIPASE, BLOOD: LIPASE: 11 U/L (ref 11–59)

## 2014-06-30 MED ORDER — PIPERACILLIN-TAZOBACTAM IN DEX 2-0.25 GM/50ML IV SOLN
2.2500 g | Freq: Four times a day (QID) | INTRAVENOUS | Status: DC
  Administered 2014-06-30 – 2014-07-01 (×3): 2.25 g via INTRAVENOUS
  Filled 2014-06-30 (×6): qty 50

## 2014-06-30 NOTE — Progress Notes (Signed)
Pt has been bradycardic, HR around 53. Ghimire MD is aware.

## 2014-06-30 NOTE — Progress Notes (Signed)
ANTIBIOTIC CONSULT NOTE - FOLLOW UP  Pharmacy Consult for Zosyn Indication: Ecoli bacteremia  Allergies  Allergen Reactions  . Statins Other (See Comments)    Pain/weakness in legs    Patient Measurements: Height: 5\' 8"  (172.7 cm) Weight: 144 lb 10 oz (65.6 kg) IBW/kg (Calculated) : 68.4 Adjusted Body Weight:   Vital Signs: Temp: 98.3 F (36.8 C) (07/21 0830) Temp src: Oral (07/21 0632) BP: 152/54 mmHg (07/21 0830) Pulse Rate: 53 (07/21 0830) Intake/Output from previous day: 07/20 0701 - 07/21 0700 In: 580 [P.O.:580] Out: 500 [Urine:500] Intake/Output from this shift:    Labs:  Recent Labs  06/28/14 2058 06/29/14 0533 06/30/14 0520  WBC 7.1 11.2* 7.2  HGB 12.1* 10.2* 10.1*  PLT 140* 120* 102*  CREATININE 2.08* 2.25* 2.82*   Estimated Creatinine Clearance: 17.4 ml/min (by C-G formula based on Cr of 2.82). No results found for this basename: VANCOTROUGH, Corlis Leak, VANCORANDOM, GENTTROUGH, GENTPEAK, GENTRANDOM, TOBRATROUGH, TOBRAPEAK, TOBRARND, AMIKACINPEAK, AMIKACINTROU, AMIKACIN,  in the last 72 hours   Microbiology: Recent Results (from the past 720 hour(s))  URINE CULTURE     Status: None   Collection Time    06/28/14 10:00 PM      Result Value Ref Range Status   Specimen Description URINE, CLEAN CATCH   Final   Special Requests NONE   Final   Culture  Setup Time     Final   Value: 06/28/2014 23:21     Performed at [place]     Final   Value: NO GROWTH     Performed at [facility]   Culture     Final   Value: NO GROWTH     Performed at [facility]   Report Status 06/30/2014 FINAL   Final  CULTURE, BLOOD (ROUTINE X 2)     Status: None   Collection Time    06/28/14 10:40 PM      Result Value Ref Range Status   Specimen Description BLOOD LEFT ARM   Final   Special Requests BOTTLES DRAWN AEROBIC AND ANAEROBIC [place]   Final   Culture  Setup Time     Final   Value: 06/29/2014 08:44     Performed at  [facility]   Culture     Final   Value: ESCHERICHIA COLI     Note: Gram Stain Report Called to,Read Back By and Verified With: LINDA BUN ON 06/29/2014 AT 7:58P BY WILEJ     Performed at [facility]   Report Status PENDING   Incomplete  CULTURE, BLOOD (ROUTINE X 2)     Status: None   Collection Time    06/28/14 10:45 PM      Result Value Ref Range Status   Specimen Description BLOOD LEFT HAND   Final   Special Requests BOTTLES DRAWN AEROBIC ONLY 8CC   Final   Culture  Setup Time     Final   Value: 06/29/2014 08:43     Performed at [facility]   Culture     Final   Value: ESCHERICHIA COLI     Note: Gram Stain Report Called to,Read Back By and Verified With: LINDA BUN ON 06/29/2014 AT 7:58P BY WILEJ     Performed at [facility]   Report Status PENDING   Incomplete    Anti-infectives   Start     Dose/Rate Route Frequency Ordered Stop   06/30/14 1600  piperacillin-tazobactam (ZOSYN) IVPB  2.25 g     2.25 g 100 mL/hr over 30 Minutes Intravenous Every 6 hours 06/30/14 1042     06/30/14 0000  vancomycin (VANCOCIN) IVPB 750 mg/150 ml premix  Status:  Discontinued     750 mg 150 mL/hr over 60 Minutes Intravenous Every 24 hours 06/29/14 0043 06/29/14 1047   06/29/14 0800  piperacillin-tazobactam (ZOSYN) IVPB 3.375 g  Status:  Discontinued     3.375 g 12.5 mL/hr over 240 Minutes Intravenous Every 8 hours 06/29/14 0043 06/30/14 1042   06/28/14 2330  cefTRIAXone (ROCEPHIN) 1 g in dextrose 5 % 50 mL IVPB  Status:  Discontinued     1 g 100 mL/hr over 30 Minutes Intravenous  Once 06/28/14 2315 06/28/14 2317   06/28/14 2330  azithromycin (ZITHROMAX) 500 mg in dextrose 5 % 250 mL IVPB  Status:  Discontinued     500 mg 250 mL/hr over 60 Minutes Intravenous  Once 06/28/14 2315 06/28/14 2317   06/28/14 2330  piperacillin-tazobactam (ZOSYN) IVPB 3.375 g     3.375 g 12.5 mL/hr over 240 Minutes Intravenous  Once 06/28/14 2317 06/29/14 0744   06/28/14 2330   vancomycin (VANCOCIN) IVPB 1000 mg/200 mL premix     1,000 mg 200 mL/hr over 60 Minutes Intravenous  Once 06/28/14 2317 06/29/14 0000      Assessment: 88yom on Zosyn Day 2 for Ecoli bacteremia. Vancomycin was discontinued 7/20. Ecoli sensitivities are currently pending - will follow-up for narrowing possibilities. SCr has continued to trend up (2.08-->2.82, CrCl 26ml/min) - will adjust Zosyn dose. - Afebrile, WBC down to wnl   Plan:  1. Change Zosyn to 2.25g IV q6h 2. Monitor renal function, cultures, clinical course and follow-up narrowing once susceptibilities reported  [name] [name] [phone] 06/30/2014,10:43 AM

## 2014-06-30 NOTE — Progress Notes (Signed)
PATIENT DETAILS Name: Gottfried Standish Age: 80 y.o. Sex: male Date of Birth: 1926-03-14 Admit Date: 06/28/2014 Admitting Physician Bynum Bellows, MD GQQ:[id number], [name], DO  Subjective: No complaints of abdominal pain.  Last BM two nights ago.  Passing flatus.  No fevers.  Assessment/Plan:  Principal Problem:  Abdominal pain  -Resolved. - Was associated with fever and elevated LFTs.  - Etiology not clear, lipase levels within normal limit. He is status post cholecystectomy in the remote past, and get a MRI of the abdomen further evaluation. Repeat lipase levels. Supportive care    Gram Neg Bacteremia -Blood culture show gram negative rods.  Continue with Zosyn.  MRI today as etiology unclear.  May have a stone lodged in the biliary tract?  Active Problems:  Acute hepatitis  -AST and ALT trending down, at 104 and 185 today.  Bilirubin at 3.3 - Both AST/ALT and alkaline phosphatase elevated, bilirubin elevated at 2.7.  - Acute hepatitis serology negative. ?Etiology of abdominal pain, ? Transient cholangitis-? did he passed a stone-but with history of remote cholecystectomy. ? gram-negative bacteremia (has history of bacteremia asked her)  - Check MRCP- follow LFTs  - Continue with empiric Zosyn-D2, stop vancomycin. Await blood cultures.   Acute on Chronic kidney disease stage III  Renal function slightly worsened compared to baseline. Have adjusted dosing of diuretic and anti-hypertensives.Continue to monitor, if worsens further may need renal input. Clinically euvolemic, no indication to start IVF at this time  Hypertension  Stable. Continue Coreg, Lasix, hydralazine and Imdur.   Type 2 diabetes  Diet controlled. Not on any diabetic medications at home. Diabetic diet. CBGs stable with Sliding scale insulin.   Coronary artery disease and status post aortic valve replacement  Continue home medications. Stable.   Chronic systolic congestive heart failure  Patient appears  compensated. Continue to monitor. Patient is not on ACE or ARB due to chronic kidney disease stage III.   Acute on Chronic kidney disease stage III  Renal function at baseline. Continue to monitor.   Anemia  Due to chronic kidney disease. Hemoglobin stable.   Hyperlipidemia  Stable.   DVT Prophylaxis  Subcutaneous heparin.   CODE STATUS  DO NOT RESUSCITATE/DO NOT INTUBATE. This was discussed with the patient and family at the time of admission.   Family Communication  Spouse and daughter at bedside   Procedures:  None  CONSULTS:  None  Time spent 40 minutes-which includes 50% of the time with face-to-face with patient/ family and coordinating care related to the above assessment and plan.   MEDICATIONS: Scheduled Meds: . aspirin EC  325 mg Oral Daily  . carvedilol  6.25 mg Oral BID WC  . cholecalciferol  1,000 Units Oral Q supper  . furosemide  40 mg Oral Daily  . heparin  5,000 Units Subcutaneous 3 times per day  . hydrALAZINE  25 mg Oral TID  . insulin aspart  0-5 Units Subcutaneous QHS  . insulin aspart  0-9 Units Subcutaneous TID WC  . isosorbide mononitrate  60 mg Oral Daily  . latanoprost  1 drop Both Eyes QHS  . multivitamin with minerals  1 tablet Oral Daily  . multivitamin-prenatal  1 tablet Oral Q supper  . pantoprazole  40 mg Oral Daily  . piperacillin-tazobactam (ZOSYN)  IV  3.375 g Intravenous Q8H  . tamsulosin  0.4 mg Oral QPC supper   Continuous Infusions:  PRN Meds:.acetaminophen, acetaminophen, ondansetron (ZOFRAN) IV, ondansetron  Antibiotics:  Anti-infectives   Start     Dose/Rate Route Frequency Ordered Stop   06/30/14 0000  vancomycin (VANCOCIN) IVPB 750 mg/150 ml premix  Status:  Discontinued     750 mg 150 mL/hr over 60 Minutes Intravenous Every 24 hours 06/29/14 0043 06/29/14 1047   06/29/14 0800  piperacillin-tazobactam (ZOSYN) IVPB 3.375 g     3.375 g 12.5 mL/hr over 240 Minutes Intravenous Every 8 hours 06/29/14 0043     06/28/14  2330  cefTRIAXone (ROCEPHIN) 1 g in dextrose 5 % 50 mL IVPB  Status:  Discontinued     1 g 100 mL/hr over 30 Minutes Intravenous  Once 06/28/14 2315 06/28/14 2317   06/28/14 2330  azithromycin (ZITHROMAX) 500 mg in dextrose 5 % 250 mL IVPB  Status:  Discontinued     500 mg 250 mL/hr over 60 Minutes Intravenous  Once 06/28/14 2315 06/28/14 2317   06/28/14 2330  piperacillin-tazobactam (ZOSYN) IVPB 3.375 g     3.375 g 12.5 mL/hr over 240 Minutes Intravenous  Once 06/28/14 2317 06/29/14 0744   06/28/14 2330  vancomycin (VANCOCIN) IVPB 1000 mg/200 mL premix     1,000 mg 200 mL/hr over 60 Minutes Intravenous  Once 06/28/14 2317 06/29/14 0000       PHYSICAL EXAM: Vital signs in last 24 hours: Filed Vitals:   06/29/14 2024 06/29/14 2141 06/29/14 2142 06/30/14 0632  BP: 117/51 126/56  142/60  Pulse: 54 55 58 55  Temp: 99.1 F (37.3 C)   98.9 F (37.2 C)  TempSrc: Oral   Oral  Resp: 18   18  Height:      Weight:    65.6 kg (144 lb 10 oz)  SpO2: 96%   99%    Weight change: 2.749 kg (6 lb 1 oz) Filed Weights   06/28/14 2045 06/29/14 0039 06/30/14 0632  Weight: 62.852 kg (138 lb 9 oz) 65.1 kg (143 lb 8.3 oz) 65.6 kg (144 lb 10 oz)   Body mass index is 21.99 kg/(m^2).   Gen Exam: Awake and alert with clear speech.  Neck: Supple, No JVD.  Chest: B/L Clear.  CVS: S1 S2 Regular, no murmurs.  Abdomen: soft, BS +, non tender, non distended.  Extremities: no edema, lower extremities warm to touch.  Neurologic: Non Focal.  Skin: No Rash.  Wounds: N/A.    Intake/Output from previous day:  Intake/Output Summary (Last 24 hours) at 06/30/14 0759 Last data filed at 06/30/14 0300  Gross per 24 hour  Intake    580 ml  Output    500 ml  Net     80 ml     LAB RESULTS: CBC  Recent Labs Lab 06/28/14 2058 06/29/14 0533 06/30/14 0520  WBC 7.1 11.2* 7.2  HGB 12.1* 10.2* 10.1*  HCT 37.5* 31.7* 30.5*  PLT 140* 120* 102*  MCV 101.9* 99.7 98.4  MCH 32.9 32.1 32.6  MCHC 32.3 32.2  33.1  RDW 13.9 14.1 14.5  LYMPHSABS 1.0  --  1.2  MONOABS 0.2  --  0.4  EOSABS 0.1  --  0.2  BASOSABS 0.0  --  0.0    Chemistries   Recent Labs Lab 06/28/14 2058 06/29/14 0533 06/30/14 0520  NA 144 143 138  K 4.7 4.2 3.9  CL 100 99 96  CO2 30 31 29   GLUCOSE 221* 132* 109*  BUN 36* 41* 46*  CREATININE 2.08* 2.25* 2.82*  CALCIUM 9.5 8.9 8.8    CBG:  Recent Labs Lab 06/29/14  0041 06/29/14 0806 06/29/14 1201 06/29/14 1705 06/29/14 2110  GLUCAP 201* 139* 150* 151* 134*    GFR Estimated Creatinine Clearance: 17.4 ml/min (by C-G formula based on Cr of 2.82).  Coagulation profile No results found for this basename: INR, PROTIME,  in the last 168 hours  Cardiac Enzymes No results found for this basename: CK, CKMB, TROPONINI, MYOGLOBIN,  in the last 168 hours  No components found with this basename: POCBNP,  No results found for this basename: DDIMER,  in the last 72 hours No results found for this basename: HGBA1C,  in the last 72 hours No results found for this basename: CHOL, HDL, LDLCALC, TRIG, CHOLHDL, LDLDIRECT,  in the last 72 hours No results found for this basename: TSH, T4TOTAL, FREET3, T3FREE, THYROIDAB,  in the last 72 hours No results found for this basename: VITAMINB12, FOLATE, FERRITIN, TIBC, IRON, RETICCTPCT,  in the last 72 hours  Recent Labs  06/28/14 2058 06/30/14 0520  LIPASE 34 11    Urine Studies No results found for this basename: UACOL, UAPR, USPG, UPH, UTP, UGL, UKET, UBIL, UHGB, UNIT, UROB, ULEU, UEPI, UWBC, URBC, UBAC, CAST, CRYS, UCOM, BILUA,  in the last 72 hours  MICROBIOLOGY: Recent Results (from the past 240 hour(s))  CULTURE, BLOOD (ROUTINE X 2)     Status: None   Collection Time    06/28/14 10:40 PM      Result Value Ref Range Status   Specimen Description BLOOD LEFT ARM   Final   Special Requests BOTTLES DRAWN AEROBIC AND ANAEROBIC [place]   Final   Culture  Setup Time     Final   Value: 06/29/2014 08:44     Performed  at [place]     Final   Value: [place]     Note: Gram Stain Report Called to,Read Back By and Verified With: LINDA BUN ON 06/29/2014 AT 7:58P BY WILEJ     Performed at [facility]   Report Status PENDING   Incomplete  CULTURE, BLOOD (ROUTINE X 2)     Status: None   Collection Time    06/28/14 10:45 PM      Result Value Ref Range Status   Specimen Description BLOOD LEFT HAND   Final   Special Requests BOTTLES DRAWN AEROBIC ONLY [place]   Final   Culture  Setup Time     Final   Value: 06/29/2014 08:43     Performed at [facility]   Culture     Final   Value: GRAM NEGATIVE RODS     Note: Gram Stain Report Called to,Read Back By and Verified With: LINDA BUN ON 06/29/2014 AT 7:58P BY WILEJ     Performed at [facility]   Report Status PENDING   Incomplete    RADIOLOGY STUDIES/RESULTS: Ct Abdomen Pelvis Wo Contrast  06/28/2014   CLINICAL DATA:  Generalized abdominal pain and cramping.  EXAM: CT ABDOMEN AND PELVIS WITHOUT CONTRAST  TECHNIQUE: Multidetector CT imaging of the abdomen and pelvis was performed following the standard protocol without IV contrast.  COMPARISON:  None.  FINDINGS: Mild nodular densities at the left lung base are nonspecific but could reflect atelectasis. Mild associated scarring is seen. Diffuse coronary artery calcifications are seen. Calcification is also noted at the aortic valve. The patient is status post median sternotomy.  There is partial atrophy of the right hepatic lobe. The liver and spleen are otherwise grossly unremarkable in appearance. The  patient is status post cholecystectomy. The pancreas is atrophic; the adrenal glands are unremarkable in appearance.  Mild bilateral renal atrophy is noted, worse on the left. Mild nonspecific perinephric stranding is seen bilaterally. A small 1.0 cm exophytic cyst is noted arising at the lower pole of the right kidney. The kidneys are otherwise unremarkable. There is no  evidence of hydronephrosis. No renal or ureteral stones are seen.  No free fluid is identified. There is a moderate to large right inguinal hernia, containing a relatively long segment of ileum, without evidence for obstruction or strangulation at this time. The small bowel is otherwise unremarkable in appearance. A small left inguinal hernia is seen, containing only fat. The stomach is within normal limits. No acute vascular abnormalities are seen. Scattered calcification is seen along the abdominal aorta and its branches.  Postoperative change is noted at the right lower quadrant. The appendix is not definitely seen. Scattered diverticulosis is noted along the descending and proximal sigmoid colon, without evidence of diverticulitis.  The bladder is mildly distended and grossly unremarkable in appearance. The prostate is enlarged, measuring 5.0 cm in transverse dimension, with minimal calcification. No inguinal lymphadenopathy is seen.  No acute osseous abnormalities are identified. Degenerative change is noted at L5, with a prominent Schmorl's node and associated sclerotic change.  IMPRESSION: 1. No acute abnormality seen to explain the patient's symptoms. 2. Moderate to large right inguinal hernia, containing a relatively long segment of ileum, without evidence for obstruction or strangulation at this time. 3. Small left inguinal hernia, containing only fat. 4. Mild bilateral renal atrophy, worse on the left. Small right renal cyst seen. 5. Partial atrophy of the right hepatic lobe. 6. Scattered nodular densities at the left lung base are nonspecific, but could reflect atelectasis. Mild associated scarring seen. 7. Diffuse coronary artery calcifications noted. Calcification noted at the mitral valve. 8. Scattered diverticulosis along the descending and proximal sigmoid colon, without evidence of diverticulitis. 9. Enlarged prostate noted. 10. Scattered calcification along the abdominal aorta and branches.    Electronically Signed   By: Garald Balding M.D.   On: 06/28/2014 22:15   Dg Chest 2 View  06/28/2014   CLINICAL DATA:  Diffuse abdominal pain.  EXAM: CHEST  2 VIEW  COMPARISON:  Chest radiograph performed 04/11/2013  FINDINGS: The lungs are well-aerated. Mild left basilar airspace opacity may reflect atelectasis or possibly pneumonia. Mild vascular congestion is noted. There is no evidence of pleural effusion or pneumothorax.  The heart is mildly enlarged. The patient is status post median sternotomy. An aortic valve replacement is noted. No acute osseous abnormalities are seen.  IMPRESSION: 1. Mild left basilar airspace opacity may reflect atelectasis or possibly mild pneumonia. 2. Mild vascular congestion and mild cardiomegaly.   Electronically Signed   By: Garald Balding M.D.   On: 06/28/2014 23:08    Janifer Adie, PA-S, [facility]  [facility] Pager:[phone]  If 7PM-7AM, please contact night-coverage [email or web address] Password [email or web address] 06/30/2014, 7:59 AM   LOS: 2 days   **Disclaimer: This note may have been dictated with voice recognition software. Similar sounding words can inadvertently be transcribed and this note may contain transcription errors which may not have been corrected upon publication of note.**  Attending Patient was seen, examined,treatment plan was discussed with the Physician extender. I have directly reviewed the clinical findings, lab, imaging studies and management of this patient in detail. I have made the necessary changes to the above noted documentation, and agree with the documentation,  as recorded by the Physician extender.  Nena Alexander MD [facility].

## 2014-07-01 ENCOUNTER — Inpatient Hospital Stay (HOSPITAL_COMMUNITY): Payer: Medicare Other

## 2014-07-01 ENCOUNTER — Ambulatory Visit: Payer: Medicare Other

## 2014-07-01 ENCOUNTER — Other Ambulatory Visit: Payer: Medicare Other

## 2014-07-01 DIAGNOSIS — R7989 Other specified abnormal findings of blood chemistry: Secondary | ICD-10-CM

## 2014-07-01 LAB — CULTURE, BLOOD (ROUTINE X 2)

## 2014-07-01 LAB — GLUCOSE, CAPILLARY
GLUCOSE-CAPILLARY: 117 mg/dL — AB (ref 70–99)
Glucose-Capillary: 163 mg/dL — ABNORMAL HIGH (ref 70–99)
Glucose-Capillary: 126 mg/dL — ABNORMAL HIGH (ref 70–99)
Glucose-Capillary: 182 mg/dL — ABNORMAL HIGH (ref 70–99)

## 2014-07-01 MED ORDER — HYDRALAZINE HCL 25 MG PO TABS
25.0000 mg | ORAL_TABLET | Freq: Three times a day (TID) | ORAL | Status: DC
  Administered 2014-07-01 – 2014-07-03 (×6): 25 mg via ORAL
  Filled 2014-07-01 (×8): qty 1

## 2014-07-01 MED ORDER — DEXTROSE 5 % IV SOLN
2.0000 g | INTRAVENOUS | Status: DC
  Administered 2014-07-01 – 2014-07-09 (×9): 2 g via INTRAVENOUS
  Filled 2014-07-01 (×9): qty 2

## 2014-07-01 NOTE — Progress Notes (Signed)
PATIENT DETAILS Name: [name] [name] Age: 80 y.o. Sex: male Date of Birth: 1926-01-15 Admit Date: 06/28/2014 Admitting Physician Bynum Bellows, MD JJ[facility]:ERDE, [name], DO  Subjective: Doing well.  Still no complaints of abdominal pain.  2 BMs last night and 1 this morning which he described as "normal."    Assessment/Plan: Abdominal pain  -Resolved.  - Was associated with fever and elevated LFTs which have been treading down. - Etiology not clear, lipase levels within normal limit. He is status post cholecystectomy in the remote past. Repeat lipase levels are normal. Supportive care.  - MRCP done this morning with multiple stones in the CBD and duct dilitation - Will consult GI for assistance  Gram Neg Bacteremia  -Blood culture with 2/2 e. coli. Transition to rocephin per sensitivities -MRCP today per above  Acute hepatitis  -AST and ALT trending down, at 104 and 185 today. Bilirubin at 3.3  - Both AST/ALT and alkaline phosphatase elevated, bilirubin elevated at 2.7.  - Acute hepatitis serology negative. ?Etiology of abdominal pain, ? Transient cholangitis-? did he passed a stone-but with history of remote cholecystectomy. ? gram-negative bacteremia (has history of bacteremia asked her)  - MRCP per above  - Initially on empiric Zosyn, transition to rocephin per sensitivities  Acute on Chronic kidney disease stage III  Renal function slightly worsened compared to baseline. Have adjusted dosing of diuretic and anti-hypertensives.Continue to monitor, if worsens further may need renal input. Clinically euvolemic, no indication to start IVF at this time   Hypertension  Stable. Continue Coreg, Lasix, hydralazine and Imdur.   Type 2 diabetes  Diet controlled. Not on any diabetic medications at home. Diabetic diet. CBGs stable with Sliding scale insulin.   Coronary artery disease and status post aortic valve replacement  Continue home medications. Stable.   Chronic  systolic congestive heart failure  Patient appears compensated. Continue to monitor. Patient is not on ACE or ARB due to chronic kidney disease stage III. Marland Kitchen  Acute on Chronic kidney disease stage III  Renal function at baseline. Continue to monitor.   Anemia  Due to chronic kidney disease. Hemoglobin stable.   Hyperlipidemia  Stable.   Disposition Keep inpatient.  DVT Prophylaxis  Subcutaneous heparin.   CODE STATUS  DO NOT RESUSCITATE/DO NOT INTUBATE. This was discussed with the patient and family at the time of admission.   Family Communication  Daughter at bedside   Procedures:  None  CONSULTS:  GI  Time spent 40 minutes-which includes 50% of the time with face-to-face with patient/ family and coordinating care related to the above assessment and plan.    MEDICATIONS: Scheduled Meds: . aspirin EC  325 mg Oral Daily  . carvedilol  6.25 mg Oral BID WC  . cholecalciferol  1,000 Units Oral Q supper  . furosemide  40 mg Oral Daily  . heparin  5,000 Units Subcutaneous 3 times per day  . hydrALAZINE  25 mg Oral TID  . insulin aspart  0-5 Units Subcutaneous QHS  . insulin aspart  0-9 Units Subcutaneous TID WC  . isosorbide mononitrate  60 mg Oral Daily  . latanoprost  1 drop Both Eyes QHS  . multivitamin with minerals  1 tablet Oral Daily  . multivitamin-prenatal  1 tablet Oral Q supper  . pantoprazole  40 mg Oral Daily  . piperacillin-tazobactam (ZOSYN)  IV  2.25 g Intravenous Q6H  . tamsulosin  0.4 mg Oral QPC supper   Continuous Infusions:  PRN Meds:.acetaminophen, acetaminophen, ondansetron (ZOFRAN) IV, ondansetron  Antibiotics: Anti-infectives   Start     Dose/Rate Route Frequency Ordered Stop   06/30/14 1600  piperacillin-tazobactam (ZOSYN) IVPB 2.25 g     2.25 g 100 mL/hr over 30 Minutes Intravenous Every 6 hours 06/30/14 1042     06/30/14 0000  vancomycin (VANCOCIN) IVPB 750 mg/150 ml premix  Status:  Discontinued     750 mg 150 mL/hr over 60 Minutes  Intravenous Every 24 hours 06/29/14 0043 06/29/14 1047   06/29/14 0800  piperacillin-tazobactam (ZOSYN) IVPB 3.375 g  Status:  Discontinued     3.375 g 12.5 mL/hr over 240 Minutes Intravenous Every 8 hours 06/29/14 0043 06/30/14 1042   06/28/14 2330  cefTRIAXone (ROCEPHIN) 1 g in dextrose 5 % 50 mL IVPB  Status:  Discontinued     1 g 100 mL/hr over 30 Minutes Intravenous  Once 06/28/14 2315 06/28/14 2317   06/28/14 2330  azithromycin (ZITHROMAX) 500 mg in dextrose 5 % 250 mL IVPB  Status:  Discontinued     500 mg 250 mL/hr over 60 Minutes Intravenous  Once 06/28/14 2315 06/28/14 2317   06/28/14 2330  piperacillin-tazobactam (ZOSYN) IVPB 3.375 g     3.375 g 12.5 mL/hr over 240 Minutes Intravenous  Once 06/28/14 2317 06/29/14 0744   06/28/14 2330  vancomycin (VANCOCIN) IVPB 1000 mg/200 mL premix     1,000 mg 200 mL/hr over 60 Minutes Intravenous  Once 06/28/14 2317 06/29/14 0000       PHYSICAL EXAM: Vital signs in last 24 hours: Filed Vitals:   06/30/14 1427 06/30/14 1703 06/30/14 2016 07/01/14 0509  BP: 113/51 138/54 153/56 152/61  Pulse: 53 50 54 52  Temp: 97.7 F (36.5 C)  97.9 F (36.6 C) 98.4 F (36.9 C)  TempSrc: Oral  Oral Oral  Resp: 19  18 18   Height:      Weight:    65.6 kg (144 lb 10 oz)  SpO2: 97%  99% 97%    Weight change: 0 kg (0 lb) Filed Weights   06/29/14 0039 06/30/14 0632 07/01/14 0509  Weight: 65.1 kg (143 lb 8.3 oz) 65.6 kg (144 lb 10 oz) 65.6 kg (144 lb 10 oz)   Body mass index is 21.99 kg/(m^2).   Gen Exam: Awake and alert with clear speech.  Neck: Supple, No JVD.  Chest: B/L Clear.  CVS: S1 S2 Regular, no murmurs.  Abdomen: soft, BS +, non tender, non distended.  Extremities: no edema, lower extremities warm to touch.  Neurologic: Non Focal.  Skin: No Rash.  Wounds: N/A.   Intake/Output from previous day:  Intake/Output Summary (Last 24 hours) at 07/01/14 0751 Last data filed at 07/01/14 0008  Gross per 24 hour  Intake    100 ml    Output    250 ml  Net   -150 ml     LAB RESULTS: CBC  Recent Labs Lab 06/28/14 2058 06/29/14 0533 06/30/14 0520  WBC 7.1 11.2* 7.2  HGB 12.1* 10.2* 10.1*  HCT 37.5* 31.7* 30.5*  PLT 140* 120* 102*  MCV 101.9* 99.7 98.4  MCH 32.9 32.1 32.6  MCHC 32.3 32.2 33.1  RDW 13.9 14.1 14.5  LYMPHSABS 1.0  --  1.2  MONOABS 0.2  --  0.4  EOSABS 0.1  --  0.2  BASOSABS 0.0  --  0.0    Chemistries   Recent Labs Lab 06/28/14 2058 06/29/14 0533 06/30/14 0520  NA 144 143 138  K 4.7  4.2 3.9  CL 100 99 96  CO2 30 31 29   GLUCOSE 221* 132* 109*  BUN 36* 41* 46*  CREATININE 2.08* 2.25* 2.82*  CALCIUM 9.5 8.9 8.8    CBG:  Recent Labs Lab 06/29/14 2110 06/30/14 0752 06/30/14 1158 06/30/14 1659 06/30/14 2121  GLUCAP 134* 110* 159* 115* 156*    GFR Estimated Creatinine Clearance: 17.4 ml/min (by C-G formula based on Cr of 2.82).  Coagulation profile No results found for this basename: INR, PROTIME,  in the last 168 hours  Cardiac Enzymes No results found for this basename: CK, CKMB, TROPONINI, MYOGLOBIN,  in the last 168 hours  No components found with this basename: POCBNP,  No results found for this basename: DDIMER,  in the last 72 hours No results found for this basename: HGBA1C,  in the last 72 hours No results found for this basename: CHOL, HDL, LDLCALC, TRIG, CHOLHDL, LDLDIRECT,  in the last 72 hours No results found for this basename: TSH, T4TOTAL, FREET3, T3FREE, THYROIDAB,  in the last 72 hours No results found for this basename: VITAMINB12, FOLATE, FERRITIN, TIBC, IRON, RETICCTPCT,  in the last 72 hours  Recent Labs  06/28/14 2058 06/30/14 0520  LIPASE 34 11    Urine Studies No results found for this basename: UACOL, UAPR, USPG, UPH, UTP, UGL, UKET, UBIL, UHGB, UNIT, UROB, ULEU, UEPI, UWBC, URBC, UBAC, CAST, CRYS, UCOM, BILUA,  in the last 72 hours  MICROBIOLOGY: Recent Results (from the past 240 hour(s))  URINE CULTURE     Status: None    Collection Time    06/28/14 10:00 PM      Result Value Ref Range Status   Specimen Description URINE, CLEAN CATCH   Final   Special Requests NONE   Final   Culture  Setup Time     Final   Value: 06/28/2014 23:21     Performed at [place]     Final   Value: NO GROWTH     Performed at [facility]   Culture     Final   Value: NO GROWTH     Performed at [facility]   Report Status 06/30/2014 FINAL   Final  CULTURE, BLOOD (ROUTINE X 2)     Status: None   Collection Time    06/28/14 10:40 PM      Result Value Ref Range Status   Specimen Description BLOOD LEFT ARM   Final   Special Requests BOTTLES DRAWN AEROBIC AND ANAEROBIC [place]   Final   Culture  Setup Time     Final   Value: 06/29/2014 08:44     Performed at [facility]   Culture     Final   Value: ESCHERICHIA COLI     Note: Gram Stain Report Called to,Read Back By and Verified With: LINDA BUN ON 06/29/2014 AT 7:58P BY WILEJ     Performed at [facility]   Report Status PENDING   Incomplete  CULTURE, BLOOD (ROUTINE X 2)     Status: None   Collection Time    06/28/14 10:45 PM      Result Value Ref Range Status   Specimen Description BLOOD LEFT HAND   Final   Special Requests BOTTLES DRAWN AEROBIC ONLY 8CC   Final   Culture  Setup Time     Final   Value: 06/29/2014 08:43     Performed at [facility]  Final   Value: ESCHERICHIA COLI     Note: Gram Stain Report Called to,Read Back By and Verified With: LINDA BUN ON 06/29/2014 AT 7:58P BY WILEJ     Performed at [facility]   Report Status PENDING   Incomplete    RADIOLOGY STUDIES/RESULTS: Ct Abdomen Pelvis Wo Contrast  06/28/2014   CLINICAL DATA:  Generalized abdominal pain and cramping.  EXAM: CT ABDOMEN AND PELVIS WITHOUT CONTRAST  TECHNIQUE: Multidetector CT imaging of the abdomen and pelvis was performed following the standard protocol without IV contrast.  COMPARISON:  None.   FINDINGS: Mild nodular densities at the left lung base are nonspecific but could reflect atelectasis. Mild associated scarring is seen. Diffuse coronary artery calcifications are seen. Calcification is also noted at the aortic valve. The patient is status post median sternotomy.  There is partial atrophy of the right hepatic lobe. The liver and spleen are otherwise grossly unremarkable in appearance. The patient is status post cholecystectomy. The pancreas is atrophic; the adrenal glands are unremarkable in appearance.  Mild bilateral renal atrophy is noted, worse on the left. Mild nonspecific perinephric stranding is seen bilaterally. A small 1.0 cm exophytic cyst is noted arising at the lower pole of the right kidney. The kidneys are otherwise unremarkable. There is no evidence of hydronephrosis. No renal or ureteral stones are seen.  No free fluid is identified. There is a moderate to large right inguinal hernia, containing a relatively long segment of ileum, without evidence for obstruction or strangulation at this time. The small bowel is otherwise unremarkable in appearance. A small left inguinal hernia is seen, containing only fat. The stomach is within normal limits. No acute vascular abnormalities are seen. Scattered calcification is seen along the abdominal aorta and its branches.  Postoperative change is noted at the right lower quadrant. The appendix is not definitely seen. Scattered diverticulosis is noted along the descending and proximal sigmoid colon, without evidence of diverticulitis.  The bladder is mildly distended and grossly unremarkable in appearance. The prostate is enlarged, measuring 5.0 cm in transverse dimension, with minimal calcification. No inguinal lymphadenopathy is seen.  No acute osseous abnormalities are identified. Degenerative change is noted at L5, with a prominent Schmorl's node and associated sclerotic change.  IMPRESSION: 1. No acute abnormality seen to explain the patient's  symptoms. 2. Moderate to large right inguinal hernia, containing a relatively long segment of ileum, without evidence for obstruction or strangulation at this time. 3. Small left inguinal hernia, containing only fat. 4. Mild bilateral renal atrophy, worse on the left. Small right renal cyst seen. 5. Partial atrophy of the right hepatic lobe. 6. Scattered nodular densities at the left lung base are nonspecific, but could reflect atelectasis. Mild associated scarring seen. 7. Diffuse coronary artery calcifications noted. Calcification noted at the mitral valve. 8. Scattered diverticulosis along the descending and proximal sigmoid colon, without evidence of diverticulitis. 9. Enlarged prostate noted. 10. Scattered calcification along the abdominal aorta and branches.   Electronically Signed   By: Garald Balding M.D.   On: 06/28/2014 22:15   Dg Chest 2 View  06/28/2014   CLINICAL DATA:  Diffuse abdominal pain.  EXAM: CHEST  2 VIEW  COMPARISON:  Chest radiograph performed 04/11/2013  FINDINGS: The lungs are well-aerated. Mild left basilar airspace opacity may reflect atelectasis or possibly pneumonia. Mild vascular congestion is noted. There is no evidence of pleural effusion or pneumothorax.  The heart is mildly enlarged. The patient is status post median sternotomy. An  aortic valve replacement is noted. No acute osseous abnormalities are seen.  IMPRESSION: 1. Mild left basilar airspace opacity may reflect atelectasis or possibly mild pneumonia. 2. Mild vascular congestion and mild cardiomegaly.   Electronically Signed   By: Garald Balding M.D.   On: 06/28/2014 23:08    Janifer Adie, PA-S, [facility]  [facility] Pager:[phone]  If 7PM-7AM, please contact night-coverage [email or web address] Password [email or web address] 07/01/2014, 7:51 AM   LOS: 3 days   **Disclaimer: This note may have been dictated with voice recognition software. Similar sounding words can inadvertently be transcribed and this note may  contain transcription errors which may not have been corrected upon publication of note.**

## 2014-07-01 NOTE — Consult Note (Signed)
Referring Provider: Dr. Wyline Copas Primary Care Physician:  Hollace Kinnier, DO Primary Gastroenterologist:  Dr. Oletta Lamas  Reason for Consultation:  Elevated LFTs; Common bile duct stones  HPI: [name] [name] is a 80 y.o. male called this evening to see patient for elevated LFTs and MRCP showing CBD stones. [name] [name] reports having the sudden onset of pain across his upper abdomen that started Sunday night and resolved Monday and has not returned. Denies any previous history of pain. Pain was severe and crampy and he had a fever of 103 per his wife. Denies N/V/chest pain/shortness of breath. Noncontrast CT unrevealing. MRCP today showed multiple stones in the CBD with dilation (see below for details). Gram negative bacteremia on IV Zosyn that was changed to IV Ceftriaxone. Remote history of colon cancer. Last colonoscopy in 2009 by Dr. Oletta Lamas that was unrevealing. Reports cholecystectomy 40 -50 years ago.  LFTs:  Results for [name], [name] (MRN [id number]) as of 07/01/2014 18:27  Ref. Range 06/28/2014 20:58 06/28/2014 22:50 06/29/2014 05:33 06/30/2014 05:20  Alkaline Phosphatase Latest Range: 39-117 U/L 453 (H)  386 (H) 296 (H)  Albumin Latest Range: 3.5-4.7 g/dL 3.6  3.1 (L) 2.6 (L)  Lipase Latest Range: 11-59 U/L 34   11  AST Latest Range: 0-37 U/L 235 (H)  342 (H) 104 (H)  ALT Latest Range: 0-53 U/L 182 (H)  292 (H) 185 (H)  Total Protein Latest Range: 6.4-8.3 g/dL 7.5  6.4 5.9 (L)  Bilirubin, Direct Latest Range: 0.0-0.3 mg/dL    2.8 (H)  Indirect Bilirubin Latest Range: 0.3-0.9 mg/dL    0.5  Total Bilirubin Latest Range: 0.3-1.2 mg/dL 1.4 (H)  2.7 (H) 3.3 (H)           Past Medical History  Diagnosis Date  . History of colon cancer     s/p colon resection  . Aortic stenosis     a. s/p AVR with 47mm [place] pericardial valve 02/07/12 - post-op course complicated by pleural effusion requring thoracentesis/leg cellulitis 03/2012.  Marland Kitchen CAD (coronary artery disease)     a. s/p NSTEMI  12/2011:  LHC - Ostial left main 20%, ostial LAD 50%, mid 60-70%, ostial D1 40% and mid 40%, D2 70%, ostial circumflex occluded, ostial RCA 80-90%, LVEDP was 42. b.  s/p CABG x 3 at time of AVR (LiMA-LAD, SVG-2nd daigonal, SVG-PDA) 02/07/12 (post-op course noted above).  . Ischemic cardiomyopathy   . Chronic systolic heart failure     a. TEE 01/2012: EF 25-30%, diffuse hypokinesis. b. Not on ACEI due to renal insufficiency.;  c. follow up  echo 08/06/12: EF 25%, mod diast dysfxn, AVR ok, mild MR, mod LAE, mild RAE, mild to mod RV systolic dysfunction  . HLD (hyperlipidemia)   . GERD (gastroesophageal reflux disease)   . Chronic kidney disease   . Pleural effusion     a. post-operatively after AVR/CABG s/p thoracentesis 03/2012 yielding 1L serosanguinous fluid.  . Diabetes mellitus     borderline  . Blood transfusion     NO REACTION TO TRANSFUSION  . Cellulitis     a. RLE cellulitis 2 months post-operatively after AVR/CABG - serratia marcessans, tx with I&D/antibiotics  . Mitral regurgitation     Moderate by TEE 01/2012  . Flash pulmonary edema     Post-cath 12/2011, went into acute pulm edema requiring IV lasix and intubation  . Baker's cyst 07/23/12    Incidental finding of LE venous dopplers  . Cataract     right eye, hx of  .  HTN (hypertension)     primary, Dr. Jose Persia  . Myocardial infarction 12/2011  . CHF (congestive heart failure)   . Stroke 10/01    RIght Leg weakness  . Cancer '90's    Colon  . Anemia     Past Surgical History  Procedure Laterality Date  . Colon resection  1996  . Esophageal dilation    . Colonoscopy    . Cataract extraction      rt  . Cardiac catheterization      1.3.13  stopped breathing, put on ventilator for 5-6 days  . Tonsillectomy    . Aortic valve replacement  02/07/2012    Procedure: AORTIC VALVE REPLACEMENT (AVR);  Surgeon: Gaye Pollack, MD;  Location: [place];  Service: Open Heart Surgery;  Laterality: N/A;  . Coronary artery bypass graft   02/07/2012    Procedure: CORONARY ARTERY BYPASS GRAFTING (CABG);  Surgeon: Gaye Pollack, MD;  Location: [place];  Service: Open Heart Surgery;  Laterality: N/A;  CABG x three; using right leg greater saphenous vein harvested endoscopically  . Chest tube insertion  07/24/2012    Procedure: INSERTION PLEURAL DRAINAGE CATHETER;  Surgeon: Gaye Pollack, MD;  Location: [place];  Service: Thoracic;  Laterality: Left;  . Talc pleurodesis  08/30/2012    Procedure: TALC PLEURADESIS;  Surgeon: Gaye Pollack, MD;  Location: [place];  Service: Thoracic;  Laterality: Left;  INSERTION OF TALC VIA LEFT PLEURX  . Talc pleurodesis  09/12/2012    Procedure: TALC PLEURADESIS;  Surgeon: Gaye Pollack, MD;  Location: [place];  Service: Thoracic;  Laterality: Left;  . Removal of pleural drainage catheter  09/27/2012    Procedure: REMOVAL OF PLEURAL DRAINAGE CATHETER;  Surgeon: Gaye Pollack, MD;  Location: [place];  Service: Thoracic;  Laterality: Left;  Marland Kitchen Eye surgery  2009    cataract removed from right eye   S/P cholecystectomy  Prior to Admission medications   Medication Sig Start Date End Date Taking? Authorizing Provider  amoxicillin (AMOXIL) 500 MG capsule Take 2,000 mg by mouth See admin instructions. Take 4 capsules (2000 mg) prior to dental appointment   Yes Historical Provider, MD  aspirin EC 325 MG tablet Take 325 mg by mouth daily.    Yes Historical Provider, MD  carvedilol (COREG) 25 MG tablet Take 1 tablet (25 mg total) by mouth 2 (two) times daily with a meal. 10/30/13  Yes Jolaine Artist, MD  cholecalciferol (VITAMIN D) 1000 UNITS tablet Take 1,000 Units by mouth daily with supper.    Yes Historical Provider, MD  furosemide (LASIX) 40 MG tablet Take 40 mg by mouth 2 (two) times daily. At breakfast and at lunch   Yes Historical Provider, MD  hydrALAZINE (APRESOLINE) 25 MG tablet Take 1 tablet (25 mg total) by mouth 3 (three) times daily. 10/30/13  Yes Jolaine Artist, MD  isosorbide mononitrate  (IMDUR) 60 MG 24 hr tablet Take 1 tablet (60 mg total) by mouth daily. 02/17/14  Yes Jolaine Artist, MD  Multiple Vitamin (MULTIVITAMIN WITH MINERALS) TABS tablet Take 1 tablet by mouth daily.   Yes Historical Provider, MD  nitroGLYCERIN (NITROSTAT) 0.4 MG SL tablet Place 0.4 mg under the tongue every 5 (five) minutes as needed for chest pain.   Yes Historical Provider, MD  omeprazole (PRILOSEC) 20 MG capsule Take 20 mg by mouth daily.    Yes Historical Provider, MD  Prenatal Vit-Fe Fumarate-FA (MULTIVITAMIN-PRENATAL) 27-0.8 MG TABS Take 1  tablet by mouth daily with supper.    Yes Historical Provider, MD  tamsulosin (FLOMAX) 0.4 MG CAPS capsule Take 0.4 mg by mouth daily after supper. Take 30 minutes after eating   Yes Historical Provider, MD  Travoprost, BAK Free, (TRAVATAN) 0.004 % SOLN ophthalmic solution Place 1 drop into both eyes at bedtime.    Yes Historical Provider, MD    Scheduled Meds: . aspirin EC  325 mg Oral Daily  . carvedilol  6.25 mg Oral BID WC  . cefTRIAXone (ROCEPHIN)  IV  2 g Intravenous Q24H  . cholecalciferol  1,000 Units Oral Q supper  . furosemide  40 mg Oral Daily  . heparin  5,000 Units Subcutaneous 3 times per day  . hydrALAZINE  25 mg Oral TID  . insulin aspart  0-5 Units Subcutaneous QHS  . insulin aspart  0-9 Units Subcutaneous TID WC  . isosorbide mononitrate  60 mg Oral Daily  . latanoprost  1 drop Both Eyes QHS  . multivitamin with minerals  1 tablet Oral Daily  . multivitamin-prenatal  1 tablet Oral Q supper  . pantoprazole  40 mg Oral Daily  . tamsulosin  0.4 mg Oral QPC supper   Continuous Infusions:  PRN Meds:.acetaminophen, acetaminophen, ondansetron (ZOFRAN) IV, ondansetron  Allergies as of 06/28/2014 - Review Complete 06/28/2014  Allergen Reaction Noted  . Statins Other (See Comments) 04/08/2013    Family History  Problem Relation Age of Onset  . Cancer Mother   . Heart attack Father   . Mental illness Brother   . Kidney failure  Brother     History   Social History  . Marital Status: Married    Spouse Name: N/A    Number of Children: N/A  . Years of Education: N/A   Occupational History  . Not on file.   Social History Main Topics  . Smoking status: Never Smoker   . Smokeless tobacco: Never Used  . Alcohol Use: No  . Drug Use: No  . Sexual Activity: Not Currently   Other Topics Concern  . Not on file   Social History Narrative  . No narrative on file    Review of Systems: All negative from GI standpoint except as stated above in HPI.  Physical Exam: Vital signs: Filed Vitals:   07/01/14 1732  BP: 146/50  Pulse: 48  Temp: 97.9  Resp: 20   Last BM Date: 06/30/14 General:   Elderly, thin, pleasant and cooperative in NAD HEENT: +scleral icterus, no oral lesions Neck: supple, nontender Lungs:  Clear throughout to auscultation.   No wheezes, crackles, or rhonchi. No acute distress. Heart:  Regular rate and rhythm; no murmurs, clicks, rubs,  or gallops. Abdomen: soft, nontender, nondistended, +BS  Rectal:  Deferred Ext: no edema  GI:  Lab Results:  Recent Labs  06/28/14 2058 06/29/14 0533 06/30/14 0520  WBC 7.1 11.2* 7.2  HGB 12.1* 10.2* 10.1*  HCT 37.5* 31.7* 30.5*  PLT 140* 120* 102*   BMET  Recent Labs  06/28/14 2058 06/29/14 0533 06/30/14 0520  NA 144 143 138  K 4.7 4.2 3.9  CL 100 99 96  CO2 30 31 29   GLUCOSE 221* 132* 109*  BUN 36* 41* 46*  CREATININE 2.08* 2.25* 2.82*  CALCIUM 9.5 8.9 8.8   LFT  Recent Labs  06/30/14 0520  PROT 5.9*  ALBUMIN 2.6*  AST 104*  ALT 185*  ALKPHOS 296*  BILITOT 3.3*  BILIDIR 2.8*  IBILI 0.5   PT/INR  No results found for this basename: LABPROT, INR,  in the last 72 hours   Studies/Results: Mr Abdomen Mrcp Wo Cm  07/01/2014   CLINICAL DATA:  Fever and elevated LFTs.  EXAM: MRI ABDOMEN WITHOUT  (INCLUDING MRCP)  TECHNIQUE: Multiplanar multisequence MR imaging of the abdomen was performed. Heavily T2-weighted images of  the biliary and pancreatic ducts were obtained, and three-dimensional MRCP images were rendered by post processing.  COMPARISON:  07/01/2014  FINDINGS: Small right pleural effusion is identified.  Atrophy of the right hepatic lobe identified. The patient is status post coli cystectomy. No focal liver abnormality identified. The common bile duct is increased in caliber measuring up to 8 mm. Within the proximal common bile duct there is a stone measuring 8 mm. In the distal common bile duct there are 3 stones measuring up to 7 mm. There is mild intrahepatic bile duct dilatation. There is volume loss from the liver parenchyma. There are multiple T2 hyperintense and T1 hypo intense structures within the head and tail of pancreas. The largest appears bilobed and is in the tail of pancreas measuring 1.4 cm. The spleen appears normal.  The adrenal glands are both within normal limits. 1 cm cyst is noted within the upper pole of the right kidney. Asymmetric cortical atrophy of the left kidney noted. There is a 5.2 cm cyst arising from the upper pole the left kidney. Normal appearance of the abdominal aorta. No retroperitoneal adenopathy identified.  A small amount of perihepatic ascites is identified.  IMPRESSION: 1. Increase caliber of the common bile duct which contains multiple stones. 2. Prior cholecystectomy with atrophy of the right hepatic lobe. 3. Atrophy of the pancreatic parenchyma which contains multiple cystic structures. If there is a history of pancreatitis these findings may reflect multiple pseudocysts. Differential considerations include branch duct IPMNs and multiple serous cystadenoma. Followup examination in 1 year with MRI is recommended. Preferably this should be performed with contrast material. 4. Left renal atrophy. 5. Bilateral renal cysts. 6. Small right pleural effusion.   Electronically Signed   By: Kerby Moors M.D.   On: 07/01/2014 08:45   Mr 3d Recon At Scanner  07/01/2014   CLINICAL  DATA:  Fever and elevated LFTs.  EXAM: MRI ABDOMEN WITHOUT  (INCLUDING MRCP)  TECHNIQUE: Multiplanar multisequence MR imaging of the abdomen was performed. Heavily T2-weighted images of the biliary and pancreatic ducts were obtained, and three-dimensional MRCP images were rendered by post processing.  COMPARISON:  07/01/2014  FINDINGS: Small right pleural effusion is identified.  Atrophy of the right hepatic lobe identified. The patient is status post coli cystectomy. No focal liver abnormality identified. The common bile duct is increased in caliber measuring up to 8 mm. Within the proximal common bile duct there is a stone measuring 8 mm. In the distal common bile duct there are 3 stones measuring up to 7 mm. There is mild intrahepatic bile duct dilatation. There is volume loss from the liver parenchyma. There are multiple T2 hyperintense and T1 hypo intense structures within the head and tail of pancreas. The largest appears bilobed and is in the tail of pancreas measuring 1.4 cm. The spleen appears normal.  The adrenal glands are both within normal limits. 1 cm cyst is noted within the upper pole of the right kidney. Asymmetric cortical atrophy of the left kidney noted. There is a 5.2 cm cyst arising from the upper pole the left kidney. Normal appearance of the abdominal aorta. No retroperitoneal adenopathy identified.  A  small amount of perihepatic ascites is identified.  IMPRESSION: 1. Increase caliber of the common bile duct which contains multiple stones. 2. Prior cholecystectomy with atrophy of the right hepatic lobe. 3. Atrophy of the pancreatic parenchyma which contains multiple cystic structures. If there is a history of pancreatitis these findings may reflect multiple pseudocysts. Differential considerations include branch duct IPMNs and multiple serous cystadenoma. Followup examination in 1 year with MRI is recommended. Preferably this should be performed with contrast material. 4. Left renal atrophy.  5. Bilateral renal cysts. 6. Small right pleural effusion.   Electronically Signed   By: Kerby Moors M.D.   On: 07/01/2014 08:45    Impression/Plan: 80 yo with acute onset of abdominal pain and elevated LFTs, fever in a patient s/p cholecystectomy decades ago and MRCP showing multiple CBD stones with dilation to 8 mm. Multiple cystic structures seen in pancreas. Will likely need an ERCP and possibly an EUS as the next step. Will discuss with Dr. Paulita Fujita to see about the timing of an ERCP. Patient aware of the risks/benefits. NPO p MN. Supportive care.    LOS: 3 days   [place] C.  07/01/2014, 6:18 PM

## 2014-07-02 LAB — COMPREHENSIVE METABOLIC PANEL
ALBUMIN: 2.6 g/dL — AB (ref 3.5–5.2)
ALT: 90 U/L — ABNORMAL HIGH (ref 0–53)
ANION GAP: 14 (ref 5–15)
AST: 32 U/L (ref 0–37)
Alkaline Phosphatase: 307 U/L — ABNORMAL HIGH (ref 39–117)
BUN: 38 mg/dL — ABNORMAL HIGH (ref 6–23)
CALCIUM: 9.1 mg/dL (ref 8.4–10.5)
CO2: 27 mEq/L (ref 19–32)
Chloride: 100 mEq/L (ref 96–112)
Creatinine, Ser: 2.65 mg/dL — ABNORMAL HIGH (ref 0.50–1.35)
GFR calc non Af Amer: 20 mL/min — ABNORMAL LOW (ref >90)
GFR, EST AFRICAN AMERICAN: 24 mL/min — AB (ref >90)
GLUCOSE: 123 mg/dL — AB (ref 70–99)
Potassium: 4.6 mEq/L (ref 3.7–5.3)
SODIUM: 141 meq/L (ref 137–147)
Total Bilirubin: 1.9 mg/dL — ABNORMAL HIGH (ref 0.3–1.2)
Total Protein: 6.4 g/dL (ref 6.0–8.3)

## 2014-07-02 LAB — GLUCOSE, CAPILLARY
GLUCOSE-CAPILLARY: 120 mg/dL — AB (ref 70–99)
Glucose-Capillary: 132 mg/dL — ABNORMAL HIGH (ref 70–99)
Glucose-Capillary: 231 mg/dL — ABNORMAL HIGH (ref 70–99)
Glucose-Capillary: 130 mg/dL — ABNORMAL HIGH (ref 70–99)

## 2014-07-02 MED ORDER — SODIUM CHLORIDE 0.9 % IV SOLN
INTRAVENOUS | Status: DC
  Administered 2014-07-02 (×2): via INTRAVENOUS

## 2014-07-02 MED ORDER — HYDRALAZINE HCL 20 MG/ML IJ SOLN
5.0000 mg | Freq: Four times a day (QID) | INTRAMUSCULAR | Status: DC | PRN
  Administered 2014-07-03: 5 mg via INTRAVENOUS
  Filled 2014-07-02: qty 1

## 2014-07-02 NOTE — Progress Notes (Addendum)
PATIENT DETAILS Name: [name] [name] Age: 80 y.o. Sex: male Date of Birth: 08-Sep-1926 Admit Date: 06/28/2014 Admitting Physician Bynum Bellows, MD TOI:ZTIW, [name], DO  Subjective: Doing well. No complaints of pain, SOB, N/V/D. Normal BMs. Afebrile.  Assessment/Plan: Choledocholithiasis with abdominal pain -Pain resolved.  - Was associated with fever and elevated LFTs which have been treading down.  - Etiology was initially not clear, lipase level was normal. He is status post cholecystectomy in the remote past. - MRCP done yesterday and showed multiple stones in the CBD and duct dilitation  - [facility] consulted and planning for ERCP and possible EUS which will likely be done on 7/24..  Gram Neg Bacteremia  -Blood culture with 2/2 e. coli. Transitioned to rocephin per sensitivities  -Source felt to be related to choledocholithiasis.  Anticipate 14 days of antibiotics.  Acute on Chronic kidney disease stage III  -Renal function slightly worsened compared to baseline.  -Have adjusted dosing of diuretic and anti-hypertensives. -Continue to monitor, if worsens further may need renal input. -baseline creatinine appears to be 2.1 - 2.2  Hypertension  Stable. Continue Coreg, Lasix, hydralazine and Imdur. Added prn IV hydralazine.  Type 2 diabetes  Diet controlled. Not on any diabetic medications at home. Diabetic diet. CBGs stable with Sliding scale insulin.   Coronary artery disease and status post aortic valve replacement  Continue home medications. Stable.   Chronic systolic congestive heart failure  Patient appears compensated. Continue to monitor. Patient is not on ACE or ARB due to chronic kidney disease stage III. Marland Kitchen   Anemia  Due to chronic kidney disease. Hemoglobin stable.   Hyperlipidemia  Stable.   Disposition  Keep inpatient.   DVT Prophylaxis  Subcutaneous heparin.   CODE STATUS  DO NOT RESUSCITATE/DO NOT INTUBATE. This was discussed with the  patient and family at the time of admission.   Family Communication  Daughter and wife at bedside   Procedures:  None  CONSULTS:  [facility] - [facility]  Time spent 40 minutes-which includes 50% of the time with face-to-face with patient/ family and coordinating care related to the above assessment and plan.    MEDICATIONS: Scheduled Meds: . aspirin EC  325 mg Oral Daily  . carvedilol  6.25 mg Oral BID WC  . cefTRIAXone (ROCEPHIN)  IV  2 g Intravenous Q24H  . cholecalciferol  1,000 Units Oral Q supper  . furosemide  40 mg Oral Daily  . heparin  5,000 Units Subcutaneous 3 times per day  . hydrALAZINE  25 mg Oral TID  . insulin aspart  0-5 Units Subcutaneous QHS  . insulin aspart  0-9 Units Subcutaneous TID WC  . isosorbide mononitrate  60 mg Oral Daily  . latanoprost  1 drop Both Eyes QHS  . multivitamin with minerals  1 tablet Oral Daily  . multivitamin-prenatal  1 tablet Oral Q supper  . pantoprazole  40 mg Oral Daily  . tamsulosin  0.4 mg Oral QPC supper   Continuous Infusions:  PRN Meds:.acetaminophen, acetaminophen, hydrALAZINE, ondansetron (ZOFRAN) IV, ondansetron  Antibiotics: Anti-infectives   Start     Dose/Rate Route Frequency Ordered Stop   07/01/14 1100  cefTRIAXone (ROCEPHIN) 2 g in dextrose 5 % 50 mL IVPB     2 g 100 mL/hr over 30 Minutes Intravenous Every 24 hours 07/01/14 1002     06/30/14 1600  piperacillin-tazobactam (ZOSYN) IVPB 2.25 g  Status:  Discontinued     2.25 g 100  mL/hr over 30 Minutes Intravenous Every 6 hours 06/30/14 1042 07/01/14 1002   06/30/14 0000  vancomycin (VANCOCIN) IVPB 750 mg/150 ml premix  Status:  Discontinued     750 mg 150 mL/hr over 60 Minutes Intravenous Every 24 hours 06/29/14 0043 06/29/14 1047   06/29/14 0800  piperacillin-tazobactam (ZOSYN) IVPB 3.375 g  Status:  Discontinued     3.375 g 12.5 mL/hr over 240 Minutes Intravenous Every 8 hours 06/29/14 0043 06/30/14 1042   06/28/14 2330  cefTRIAXone (ROCEPHIN) 1 g in dextrose 5 %  50 mL IVPB  Status:  Discontinued     1 g 100 mL/hr over 30 Minutes Intravenous  Once 06/28/14 2315 06/28/14 2317   06/28/14 2330  azithromycin (ZITHROMAX) 500 mg in dextrose 5 % 250 mL IVPB  Status:  Discontinued     500 mg 250 mL/hr over 60 Minutes Intravenous  Once 06/28/14 2315 06/28/14 2317   06/28/14 2330  piperacillin-tazobactam (ZOSYN) IVPB 3.375 g     3.375 g 12.5 mL/hr over 240 Minutes Intravenous  Once 06/28/14 2317 06/29/14 0744   06/28/14 2330  vancomycin (VANCOCIN) IVPB 1000 mg/200 mL premix     1,000 mg 200 mL/hr over 60 Minutes Intravenous  Once 06/28/14 2317 06/29/14 0000       PHYSICAL EXAM: Vital signs in last 24 hours: Filed Vitals:   07/01/14 2013 07/02/14 0550 07/02/14 0624 07/02/14 0752  BP: 144/65 164/64 154/63 150/60  Pulse: 49 61  55  Temp: 97.8 F (36.6 C) 98.1 F (36.7 C)    TempSrc: Oral Oral    Resp: 20 20    Height:      Weight:  64.501 kg (142 lb 3.2 oz)    SpO2: 99% 95%      Weight change: -1.099 kg (-2 lb 6.8 oz) Filed Weights   06/30/14 0632 07/01/14 0509 07/02/14 0550  Weight: 65.6 kg (144 lb 10 oz) 65.6 kg (144 lb 10 oz) 64.501 kg (142 lb 3.2 oz)   Body mass index is 21.63 kg/(m^2).   Gen Exam: Awake and alert with clear speech. Wife and daughter at bedside. Neck: Supple, No JVD.  Chest: B/L Clear. No accessory muscle use. CVS: S1 S2 Regular, no murmurs.  Abdomen: soft, BS +, non tender, non distended. No organomegaly Extremities: no edema, lower extremities warm to touch.  Neurologic: Non Focal.    Intake/Output from previous day:  Intake/Output Summary (Last 24 hours) at 07/02/14 1343 Last data filed at 07/01/14 1854  Gross per 24 hour  Intake    372 ml  Output      0 ml  Net    372 ml     LAB RESULTS: CBC  Recent Labs Lab 06/28/14 2058 06/29/14 0533 06/30/14 0520  WBC 7.1 11.2* 7.2  HGB 12.1* 10.2* 10.1*  HCT 37.5* 31.7* 30.5*  PLT 140* 120* 102*  MCV 101.9* 99.7 98.4  MCH 32.9 32.1 32.6  MCHC 32.3 32.2  33.1  RDW 13.9 14.1 14.5  LYMPHSABS 1.0  --  1.2  MONOABS 0.2  --  0.4  EOSABS 0.1  --  0.2  BASOSABS 0.0  --  0.0    Chemistries   Recent Labs Lab 06/28/14 2058 06/29/14 0533 06/30/14 0520 07/02/14 0835  NA 144 143 138 141  K 4.7 4.2 3.9 4.6  CL 100 99 96 100  CO2 30 31 29 27   GLUCOSE 221* 132* 109* 123*  BUN 36* 41* 46* 38*  CREATININE 2.08* 2.25* 2.82* 2.65*  CALCIUM 9.5 8.9 8.8 9.1    CBG:  Recent Labs Lab 07/01/14 1214 07/01/14 1651 07/01/14 2107 07/02/14 0750 07/02/14 1201  GLUCAP 126* 182* 117* 132* 231*     Recent Labs  06/30/14 0520  LIPASE 11     MICROBIOLOGY: Recent Results (from the past 240 hour(s))  URINE CULTURE     Status: None   Collection Time    06/28/14 10:00 PM      Result Value Ref Range Status   Specimen Description URINE, CLEAN CATCH   Final   Special Requests NONE   Final   Culture  Setup Time     Final   Value: 06/28/2014 23:21     Performed at [facility] Count     Final   Value: NO GROWTH     Performed at [facility]   Culture     Final   Value: NO GROWTH     Performed at [facility]   Report Status 06/30/2014 FINAL   Final  CULTURE, BLOOD (ROUTINE X 2)     Status: None   Collection Time    06/28/14 10:40 PM      Result Value Ref Range Status   Specimen Description BLOOD LEFT ARM   Final   Special Requests BOTTLES DRAWN AEROBIC AND ANAEROBIC [facility] EACH   Final   Culture  Setup Time     Final   Value: 06/29/2014 08:44     Performed at [facility]   Culture     Final   Value: ESCHERICHIA COLI     Note: Gram Stain Report Called to,Read Back By and Verified With: LINDA BUN ON 06/29/2014 AT 7:58P BY WILEJ     Performed at [facility]   Report Status 07/01/2014 FINAL   Final   Organism ID, Bacteria ESCHERICHIA COLI   Final  CULTURE, BLOOD (ROUTINE X 2)     Status: None   Collection Time    06/28/14 10:45 PM      Result Value Ref Range Status   Specimen  Description BLOOD LEFT HAND   Final   Special Requests BOTTLES DRAWN AEROBIC ONLY 8CC   Final   Culture  Setup Time     Final   Value: 06/29/2014 08:43     Performed at [facility]   Culture     Final   Value: ESCHERICHIA COLI     Note: SUSCEPTIBILITIES PERFORMED ON PREVIOUS CULTURE WITHIN THE LAST 5 DAYS.     Note: Gram Stain Report Called to,Read Back By and Verified With: LINDA BUN ON 06/29/2014 AT 7:58P BY Dennard Nip     Performed at [facility]   Report Status 07/01/2014 FINAL   Final    RADIOLOGY STUDIES/RESULTS: Ct Abdomen Pelvis Wo Contrast  06/28/2014   CLINICAL DATA:  Generalized abdominal pain and cramping.  EXAM: CT ABDOMEN AND PELVIS WITHOUT CONTRAST  TECHNIQUE: Multidetector CT imaging of the abdomen and pelvis was performed following the standard protocol without IV contrast.  COMPARISON:  None.  FINDINGS: Mild nodular densities at the left lung base are nonspecific but could reflect atelectasis. Mild associated scarring is seen. Diffuse coronary artery calcifications are seen. Calcification is also noted at the aortic valve. The patient is status post median sternotomy.  There is partial atrophy of the right hepatic lobe. The liver and spleen are otherwise grossly unremarkable in appearance. The patient is status post cholecystectomy. The pancreas is atrophic; the  adrenal glands are unremarkable in appearance.  Mild bilateral renal atrophy is noted, worse on the left. Mild nonspecific perinephric stranding is seen bilaterally. A small 1.0 cm exophytic cyst is noted arising at the lower pole of the right kidney. The kidneys are otherwise unremarkable. There is no evidence of hydronephrosis. No renal or ureteral stones are seen.  No free fluid is identified. There is a moderate to large right inguinal hernia, containing a relatively long segment of ileum, without evidence for obstruction or strangulation at this time. The small bowel is otherwise unremarkable in appearance. A  small left inguinal hernia is seen, containing only fat. The stomach is within normal limits. No acute vascular abnormalities are seen. Scattered calcification is seen along the abdominal aorta and its branches.  Postoperative change is noted at the right lower quadrant. The appendix is not definitely seen. Scattered diverticulosis is noted along the descending and proximal sigmoid colon, without evidence of diverticulitis.  The bladder is mildly distended and grossly unremarkable in appearance. The prostate is enlarged, measuring 5.0 cm in transverse dimension, with minimal calcification. No inguinal lymphadenopathy is seen.  No acute osseous abnormalities are identified. Degenerative change is noted at L5, with a prominent Schmorl's node and associated sclerotic change.  IMPRESSION: 1. No acute abnormality seen to explain the patient's symptoms. 2. Moderate to large right inguinal hernia, containing a relatively long segment of ileum, without evidence for obstruction or strangulation at this time. 3. Small left inguinal hernia, containing only fat. 4. Mild bilateral renal atrophy, worse on the left. Small right renal cyst seen. 5. Partial atrophy of the right hepatic lobe. 6. Scattered nodular densities at the left lung base are nonspecific, but could reflect atelectasis. Mild associated scarring seen. 7. Diffuse coronary artery calcifications noted. Calcification noted at the mitral valve. 8. Scattered diverticulosis along the descending and proximal sigmoid colon, without evidence of diverticulitis. 9. Enlarged prostate noted. 10. Scattered calcification along the abdominal aorta and branches.   Electronically Signed   By: Garald Balding M.D.   On: 06/28/2014 22:15   Dg Chest 2 View  06/28/2014   CLINICAL DATA:  Diffuse abdominal pain.  EXAM: CHEST  2 VIEW  COMPARISON:  Chest radiograph performed 04/11/2013  FINDINGS: The lungs are well-aerated. Mild left basilar airspace opacity may reflect atelectasis or  possibly pneumonia. Mild vascular congestion is noted. There is no evidence of pleural effusion or pneumothorax.  The heart is mildly enlarged. The patient is status post median sternotomy. An aortic valve replacement is noted. No acute osseous abnormalities are seen.  IMPRESSION: 1. Mild left basilar airspace opacity may reflect atelectasis or possibly mild pneumonia. 2. Mild vascular congestion and mild cardiomegaly.   Electronically Signed   By: Garald Balding M.D.   On: 06/28/2014 23:08   Mr Abdomen Mrcp Wo Cm  07/01/2014   CLINICAL DATA:  Fever and elevated LFTs.  EXAM: MRI ABDOMEN WITHOUT  (INCLUDING MRCP)  TECHNIQUE: Multiplanar multisequence MR imaging of the abdomen was performed. Heavily T2-weighted images of the biliary and pancreatic ducts were obtained, and three-dimensional MRCP images were rendered by post processing.  COMPARISON:  07/01/2014  FINDINGS: Small right pleural effusion is identified.  Atrophy of the right hepatic lobe identified. The patient is status post coli cystectomy. No focal liver abnormality identified. The common bile duct is increased in caliber measuring up to 8 mm. Within the proximal common bile duct there is a stone measuring 8 mm. In the distal common bile duct there are  3 stones measuring up to 7 mm. There is mild intrahepatic bile duct dilatation. There is volume loss from the liver parenchyma. There are multiple T2 hyperintense and T1 hypo intense structures within the head and tail of pancreas. The largest appears bilobed and is in the tail of pancreas measuring 1.4 cm. The spleen appears normal.  The adrenal glands are both within normal limits. 1 cm cyst is noted within the upper pole of the right kidney. Asymmetric cortical atrophy of the left kidney noted. There is a 5.2 cm cyst arising from the upper pole the left kidney. Normal appearance of the abdominal aorta. No retroperitoneal adenopathy identified.  A small amount of perihepatic ascites is identified.   IMPRESSION: 1. Increase caliber of the common bile duct which contains multiple stones. 2. Prior cholecystectomy with atrophy of the right hepatic lobe. 3. Atrophy of the pancreatic parenchyma which contains multiple cystic structures. If there is a history of pancreatitis these findings may reflect multiple pseudocysts. Differential considerations include branch duct IPMNs and multiple serous cystadenoma. Followup examination in 1 year with MRI is recommended. Preferably this should be performed with contrast material. 4. Left renal atrophy. 5. Bilateral renal cysts. 6. Small right pleural effusion.   Electronically Signed   By: Kerby Moors M.D.   On: 07/01/2014 08:45   Mr 3d Recon At Scanner  07/01/2014   CLINICAL DATA:  Fever and elevated LFTs.  EXAM: MRI ABDOMEN WITHOUT  (INCLUDING MRCP)  TECHNIQUE: Multiplanar multisequence MR imaging of the abdomen was performed. Heavily T2-weighted images of the biliary and pancreatic ducts were obtained, and three-dimensional MRCP images were rendered by post processing.  COMPARISON:  07/01/2014  FINDINGS: Small right pleural effusion is identified.  Atrophy of the right hepatic lobe identified. The patient is status post coli cystectomy. No focal liver abnormality identified. The common bile duct is increased in caliber measuring up to 8 mm. Within the proximal common bile duct there is a stone measuring 8 mm. In the distal common bile duct there are 3 stones measuring up to 7 mm. There is mild intrahepatic bile duct dilatation. There is volume loss from the liver parenchyma. There are multiple T2 hyperintense and T1 hypo intense structures within the head and tail of pancreas. The largest appears bilobed and is in the tail of pancreas measuring 1.4 cm. The spleen appears normal.  The adrenal glands are both within normal limits. 1 cm cyst is noted within the upper pole of the right kidney. Asymmetric cortical atrophy of the left kidney noted. There is a 5.2 cm cyst  arising from the upper pole the left kidney. Normal appearance of the abdominal aorta. No retroperitoneal adenopathy identified.  A small amount of perihepatic ascites is identified.  IMPRESSION: 1. Increase caliber of the common bile duct which contains multiple stones. 2. Prior cholecystectomy with atrophy of the right hepatic lobe. 3. Atrophy of the pancreatic parenchyma which contains multiple cystic structures. If there is a history of pancreatitis these findings may reflect multiple pseudocysts. Differential considerations include branch duct IPMNs and multiple serous cystadenoma. Followup examination in 1 year with MRI is recommended. Preferably this should be performed with contrast material. 4. Left renal atrophy. 5. Bilateral renal cysts. 6. Small right pleural effusion.   Electronically Signed   By: Kerby Moors M.D.   On: 07/01/2014 08:45    Janifer Adie, PA-S, [facility]  [place], [place] [facility] Pager:[phone]  If 7PM-7AM, please contact night-coverage [email or web address] Password [facility] 07/02/2014, 1:43  PM   LOS: 4 days   **Disclaimer: This note may have been dictated with voice recognition software. Similar sounding words can inadvertently be transcribed and this note may contain transcription errors which may not have been corrected upon publication of note.**  Pt seen and examined with Ms. York, PA-C. Agree with assessment and plan as outlined above. Pt with choledocholithiasis with ecoli bacteremia. GI is consulted with plans for ERCP.

## 2014-07-02 NOTE — Progress Notes (Signed)
Patient ID: [name] [name], male   DOB: [birth year], 80 y.o.   MRN: [id number] [facility] Progress Note  [name] [name] 80 y.o. 12/24/1925   Subjective: Feels good. Denies abdominal pain/N/V. Wife and daughter at bedside.  Objective: Vital signs in last 24 hours: Filed Vitals:   07/02/14 0752  BP: 150/60  Pulse: 55  Temp: 98.1  Resp: 20    Physical Exam: Gen: alert, no acute distress, elderly Abd: soft, nontender, nondistended, +BS  Lab Results:  Recent Labs  06/30/14 0520 07/02/14 0835  NA 138 141  K 3.9 4.6  CL 96 100  CO2 29 27  GLUCOSE 109* 123*  BUN 46* 38*  CREATININE 2.82* 2.65*  CALCIUM 8.8 9.1    Recent Labs  06/30/14 0520 07/02/14 0835  AST 104* 32  ALT 185* 90*  ALKPHOS 296* 307*  BILITOT 3.3* 1.9*  PROT 5.9* 6.4  ALBUMIN 2.6* 2.6*    Recent Labs  06/30/14 0520  WBC 7.2  NEUTROABS 5.4  HGB 10.1*  HCT 30.5*  MCV 98.4  PLT 102*   No results found for this basename: LABPROT, INR,  in the last 72 hours    Assessment/Plan: 80 yo with elevated LFTs and CBD stones seen on MRCP. Asymptomatic. Needs ERCP by Dr. Paulita Fujita planned for tomorrow (Time to be determined). Clears for dinner. NPO p MN.   [place] C. 07/02/2014, 1:41 PM

## 2014-07-02 NOTE — Progress Notes (Signed)
Pt refused bedalarm. Pt states he will use urinal tonight. Wrote nurse and nurse techs phone number on paper and placed on bed side table for pt to use tonight. Pt stated he would call for assistance if needed. Will continue to monitor pt. Ranelle Oyster, RN

## 2014-07-02 NOTE — Progress Notes (Signed)
Pt refusing bed alarm. Pt stated that he is able to walk to bathroom with walker by himself.  Placed walker by bed side.  Told pt to call for assistance if he needs it.  Will continue to monitor pt. Ranelle Oyster, RN

## 2014-07-03 ENCOUNTER — Encounter (HOSPITAL_COMMUNITY)
Admission: EM | Disposition: A | Discharge status: Home / Self Care | Payer: Self-pay | Source: Home / Self Care | Attending: Internal Medicine

## 2014-07-03 ENCOUNTER — Encounter (HOSPITAL_COMMUNITY): Payer: Medicare Other | Admitting: Certified Registered Nurse Anesthetist

## 2014-07-03 ENCOUNTER — Encounter (HOSPITAL_COMMUNITY): Payer: Self-pay

## 2014-07-03 ENCOUNTER — Inpatient Hospital Stay (HOSPITAL_COMMUNITY): Payer: Medicare Other | Admitting: Certified Registered Nurse Anesthetist

## 2014-07-03 DIAGNOSIS — I10 Essential (primary) hypertension: Secondary | ICD-10-CM

## 2014-07-03 HISTORY — PX: ESOPHAGOGASTRODUODENOSCOPY: SHX5428

## 2014-07-03 LAB — CBC
HEMATOCRIT: 32.3 % — AB (ref 39.0–52.0)
Hemoglobin: 10.8 g/dL — ABNORMAL LOW (ref 13.0–17.0)
MCH: 32.9 pg (ref 26.0–34.0)
MCHC: 33.4 g/dL (ref 30.0–36.0)
MCV: 98.5 fL (ref 78.0–100.0)
Platelets: 113 10*3/uL — ABNORMAL LOW (ref 150–400)
RBC: 3.28 MIL/uL — AB (ref 4.22–5.81)
RDW: 14.2 % (ref 11.5–15.5)
WBC: 5.2 10*3/uL (ref 4.0–10.5)

## 2014-07-03 LAB — COMPREHENSIVE METABOLIC PANEL
ALBUMIN: 2.6 g/dL — AB (ref 3.5–5.2)
ALT: 71 U/L — ABNORMAL HIGH (ref 0–53)
AST: 33 U/L (ref 0–37)
Alkaline Phosphatase: 313 U/L — ABNORMAL HIGH (ref 39–117)
Anion gap: 11 (ref 5–15)
BUN: 34 mg/dL — ABNORMAL HIGH (ref 6–23)
CALCIUM: 9 mg/dL (ref 8.4–10.5)
CO2: 28 mEq/L (ref 19–32)
Chloride: 100 mEq/L (ref 96–112)
Creatinine, Ser: 2.37 mg/dL — ABNORMAL HIGH (ref 0.50–1.35)
GFR calc Af Amer: 27 mL/min — ABNORMAL LOW (ref >90)
GFR calc non Af Amer: 23 mL/min — ABNORMAL LOW (ref >90)
Glucose, Bld: 131 mg/dL — ABNORMAL HIGH (ref 70–99)
POTASSIUM: 4.7 meq/L (ref 3.7–5.3)
SODIUM: 139 meq/L (ref 137–147)
Total Bilirubin: 1.2 mg/dL (ref 0.3–1.2)
Total Protein: 6.4 g/dL (ref 6.0–8.3)

## 2014-07-03 LAB — PROTIME-INR
INR: 1 (ref 0.00–1.49)
PROTHROMBIN TIME: 13.2 s (ref 11.6–15.2)

## 2014-07-03 LAB — GLUCOSE, CAPILLARY
GLUCOSE-CAPILLARY: 103 mg/dL — AB (ref 70–99)
Glucose-Capillary: 117 mg/dL — ABNORMAL HIGH (ref 70–99)
Glucose-Capillary: 121 mg/dL — ABNORMAL HIGH (ref 70–99)
Glucose-Capillary: 165 mg/dL — ABNORMAL HIGH (ref 70–99)

## 2014-07-03 SURGERY — EGD (ESOPHAGOGASTRODUODENOSCOPY)
Anesthesia: General

## 2014-07-03 MED ORDER — CARVEDILOL 3.125 MG PO TABS
3.1250 mg | ORAL_TABLET | Freq: Two times a day (BID) | ORAL | Status: DC
  Administered 2014-07-05 – 2014-07-09 (×4): 3.125 mg via ORAL
  Filled 2014-07-03 (×14): qty 1

## 2014-07-03 MED ORDER — CIPROFLOXACIN IN D5W 400 MG/200ML IV SOLN
INTRAVENOUS | Status: AC
Start: 2014-07-03 — End: 2014-07-03
  Filled 2014-07-03: qty 200

## 2014-07-03 MED ORDER — LACTATED RINGERS IV SOLN
INTRAVENOUS | Status: DC | PRN
  Administered 2014-07-03: 14:00:00 via INTRAVENOUS

## 2014-07-03 MED ORDER — GLYCOPYRROLATE 0.2 MG/ML IJ SOLN
INTRAMUSCULAR | Status: DC | PRN
  Administered 2014-07-03: 0.2 mg via INTRAVENOUS

## 2014-07-03 MED ORDER — PROPOFOL 10 MG/ML IV BOLUS
INTRAVENOUS | Status: DC | PRN
  Administered 2014-07-03: 120 mg via INTRAVENOUS

## 2014-07-03 MED ORDER — CIPROFLOXACIN IN D5W 400 MG/200ML IV SOLN
400.0000 mg | Freq: Once | INTRAVENOUS | Status: AC
  Administered 2014-07-03: 400 mg via INTRAVENOUS

## 2014-07-03 MED ORDER — ONDANSETRON HCL 4 MG/2ML IJ SOLN
INTRAMUSCULAR | Status: DC | PRN
  Administered 2014-07-03: 4 mg via INTRAVENOUS

## 2014-07-03 MED ORDER — HYDRALAZINE HCL 50 MG PO TABS
50.0000 mg | ORAL_TABLET | Freq: Three times a day (TID) | ORAL | Status: DC
  Administered 2014-07-03 – 2014-07-09 (×18): 50 mg via ORAL
  Filled 2014-07-03 (×21): qty 1

## 2014-07-03 MED ORDER — SODIUM CHLORIDE 0.9 % IV SOLN
INTRAVENOUS | Status: DC
  Administered 2014-07-03: 13:00:00 via INTRAVENOUS

## 2014-07-03 MED ORDER — FENTANYL CITRATE 0.05 MG/ML IJ SOLN
INTRAMUSCULAR | Status: DC | PRN
  Administered 2014-07-03: 100 ug via INTRAVENOUS

## 2014-07-03 MED ORDER — LIDOCAINE HCL (CARDIAC) 20 MG/ML IV SOLN
INTRAVENOUS | Status: DC | PRN
  Administered 2014-07-03: 80 mg via INTRAVENOUS

## 2014-07-03 MED ORDER — SUCCINYLCHOLINE CHLORIDE 20 MG/ML IJ SOLN
INTRAMUSCULAR | Status: DC | PRN
  Administered 2014-07-03: 100 mg via INTRAVENOUS

## 2014-07-03 MED ORDER — EPHEDRINE SULFATE 50 MG/ML IJ SOLN
INTRAMUSCULAR | Status: DC | PRN
  Administered 2014-07-03: 5 mg via INTRAVENOUS

## 2014-07-03 NOTE — Anesthesia Postprocedure Evaluation (Signed)
Anesthesia Post Note  Patient: [name] [name]  Procedure(s) Performed: Procedure(s) (LRB): ESOPHAGOGASTRODUODENOSCOPY (EGD) (N/A)  Anesthesia type: General  Patient location: PACU  Post pain: Pain level controlled  Post assessment: Patient's Cardiovascular Status Stable  Last Vitals:  Filed Vitals:   07/03/14 1500  BP: 160/55  Pulse:   Temp:   Resp:     Post vital signs: Reviewed and stable  Level of consciousness: alert  Complications: No apparent anesthesia complications

## 2014-07-03 NOTE — Anesthesia Preprocedure Evaluation (Addendum)
Anesthesia Evaluation  Patient identified by MRN, date of birth, ID band Patient awake    Reviewed: Allergy & Precautions, H&P , NPO status , Patient's Chart, lab work & pertinent test results, reviewed documented beta blocker date and time   History of Anesthesia Complications Negative for: history of anesthetic complications  Airway Mallampati: II TM Distance: >3 FB Neck ROM: Full    Dental  (+) Edentulous Upper, Partial Lower, Dental Advisory Given   Pulmonary neg pulmonary ROS,  breath sounds clear to auscultation  Pulmonary exam normal       Cardiovascular hypertension, Pt. on medications + CAD, + Past MI, + CABG and +CHF + Valvular Problems/Murmurs Rhythm:regular Rate:Bradycardia  S/p AVR and CABG 2013, EF 40%   Neuro/Psych CVA negative psych ROS   GI/Hepatic Neg liver ROS, GERD-  Medicated and Controlled,  Endo/Other  diabetes, Oral Hypoglycemic Agents"borderline" DM  Renal/GU ESRFRenal disease  negative genitourinary   Musculoskeletal   Abdominal   Peds  Hematology  (+) anemia ,   Anesthesia Other Findings See surgeon's H&P   Reproductive/Obstetrics negative OB ROS                         Anesthesia Physical Anesthesia Plan  ASA: III  Anesthesia Plan: General   Post-op Pain Management:    Induction: Intravenous  Airway Management Planned: Oral ETT  Additional Equipment:   Intra-op Plan:   Post-operative Plan: Extubation in OR  Informed Consent: I have reviewed the patients History and Physical, chart, labs and discussed the procedure including the risks, benefits and alternatives for the proposed anesthesia with the patient or authorized representative who has indicated his/her understanding and acceptance.   Dental advisory given  Plan Discussed with: Anesthesiologist and Surgeon  Anesthesia Plan Comments:         Anesthesia Quick Evaluation

## 2014-07-03 NOTE — Transfer of Care (Signed)
Immediate Anesthesia Transfer of Care Note  Patient: [name] [name]  Procedure(s) Performed: Procedure(s): ENDOSCOPIC RETROGRADE CHOLANGIOPANCREATOGRAPHY (ERCP) (N/A)  Patient Location: PACU  Anesthesia Type:General  Level of Consciousness: awake, alert  and oriented  Airway & Oxygen Therapy: Patient Spontanous Breathing and Patient connected to nasal cannula oxygen  Post-op Assessment: Report given to PACU RN and Post -op Vital signs reviewed and stable  Post vital signs: Reviewed and stable  Complications: No apparent anesthesia complications

## 2014-07-03 NOTE — Anesthesia Procedure Notes (Signed)
Procedure Name: Intubation Date/Time: 07/03/2014 1:44 PM Performed by: [place] Pink Pre-anesthesia Checklist: Patient identified, Emergency Drugs available, Suction available, Patient being monitored and Timeout performed Patient Re-evaluated:Patient Re-evaluated prior to inductionOxygen Delivery Method: Circle system utilized Preoxygenation: Pre-oxygenation with 100% oxygen Intubation Type: IV induction and Rapid sequence Laryngoscope Size: Mac and 3 Grade View: Grade I Tube type: Oral Tube size: 7.5 mm Number of attempts: 1 Airway Equipment and Method: Stylet Placement Confirmation: ETT inserted through vocal cords under direct vision,  positive ETCO2 and breath sounds checked- equal and bilateral Secured at: 22 cm Tube secured with: Tape Dental Injury: Teeth and Oropharynx as per pre-operative assessment

## 2014-07-03 NOTE — Progress Notes (Signed)
Patient ID: [name] [name], male   DOB: 06-11-1926, 80 y.o.   MRN: [id number] [facility] Progress Note  [name] [name] 80 y.o. 1926-03-19   Subjective: Feels good. Denies N/V/abdominal pain. Wife and daughter at bedside.  Objective: Vital signs: Filed Vitals:   07/03/14 0904  BP: 172/65  Pulse: 49  Temp: 98.3  Resp: 18    Physical Exam: Gen: alert, elderly, thin, no acute distress  Abd: soft, nontender, nondistended, +BS  Lab Results:  Recent Labs  07/02/14 0835  NA 141  K 4.6  CL 100  CO2 27  GLUCOSE 123*  BUN 38*  CREATININE 2.65*  CALCIUM 9.1    Recent Labs  07/02/14 0835  AST 32  ALT 90*  ALKPHOS 307*  BILITOT 1.9*  PROT 6.4  ALBUMIN 2.6*   No results found for this basename: WBC, NEUTROABS, HGB, HCT, MCV, PLT,  in the last 72 hours    Assessment/Plan: 80 yo with CBD stones on MRCP; AST/ALT/TB trending down. ALP yesterday 307 (296). No new labs today so stat CBC, CMET, PT/INR ordered. ERCP scheduled for 1 pm by Dr. Paulita Fujita. Dr. Penelope Coop will see tomorrow.   [place] C. 07/03/2014, 10:25 AM

## 2014-07-03 NOTE — H&P (View-Only) (Signed)
Patient ID: [name] [name], male   DOB: [birth year], 80 y.o.   MRN: [id number] [facility] Gastroenterology Progress Note  Davari Lopes 80 y.o. 1926/09/01   Subjective: Feels good. Denies N/V/abdominal pain. Wife and daughter at bedside.  Objective: Vital signs: Filed Vitals:   07/03/14 0904  BP: 172/65  Pulse: 49  Temp: 98.3  Resp: 18    Physical Exam: Gen: alert, elderly, thin, no acute distress  Abd: soft, nontender, nondistended, +BS  Lab Results:  Recent Labs  07/02/14 0835  NA 141  K 4.6  CL 100  CO2 27  GLUCOSE 123*  BUN 38*  CREATININE 2.65*  CALCIUM 9.1    Recent Labs  07/02/14 0835  AST 32  ALT 90*  ALKPHOS 307*  BILITOT 1.9*  PROT 6.4  ALBUMIN 2.6*   No results found for this basename: WBC, NEUTROABS, HGB, HCT, MCV, PLT,  in the last 72 hours    Assessment/Plan: 80 yo with CBD stones on MRCP; AST/ALT/TB trending down. ALP yesterday 307 (296). No new labs today so stat CBC, CMET, PT/INR ordered. ERCP scheduled for 1 pm by Dr. Paulita Fujita. Dr. Penelope Coop will see tomorrow.   Clinton C. 07/03/2014, 10:25 AM

## 2014-07-03 NOTE — Progress Notes (Addendum)
PATIENT DETAILS Name: Pasco Marchitto Age: 80 y.o. Sex: male Date of Birth: [birth year] Admit Date: 06/28/2014 Admitting Physician Bynum Bellows, MD JME:QAST, [name], DO  Subjective: Doing fine, same as yesterday.  No new complaints.  Denies abdominal pain, chest pain, SOB, N/V.  BM this morning described as being a little runny which he associates to the soft diet he at last night.  Assessment/Plan: Choledocholithiasis with abdominal pain  - Pain resolved.  - Was associated with fever and elevated LFTs which have been treading down.  - Etiology was initially not clear, lipase level was normal. He is status post cholecystectomy in the remote past.  - MRCP done 7/22 and showed multiple stones in the CBD and duct dilitation  - ERCP was attempted by Dr. Paulita Fujita but unfortunately a tight esophageal stricture prevented even the 10 mm diameter endoscope from passing thru his esophagus.  Dr. Radene Journey (ENT) has performed previous esophageal dilations on Mr. Ritchey.  Dr. Paulita Fujita requested that Dr. Lucia Gaskins be consulted for esophageal dilation after which the ERCP will be re-attempted.  Mr. Braithwaite will be kept inpatient until these procedures can be completed.  Gram Neg Bacteremia  -Blood culture with 2/2 e. coli. Transitioned to rocephin per sensitivities  -Source felt to be related to choledocholithiasis. Anticipate 14 days of antibiotics.  Will progress to ceftin on discharge.  Acute on Chronic kidney disease stage III  -Renal function slightly worsened compared to baseline.  -Have adjusted dosing of diuretic and anti-hypertensives.  -Continue to monitor, if worsens further may need renal input.  -baseline creatinine appears to be 2.1 - 2.2   Hypertension  Patient has been slightly bradycardic and BP has been elevated.   Will decrease coreg to 3.125 bid and increase hydralazine to 50 mg TID.   No ACE/ARB due to CKD.  Type 2 diabetes  Diet controlled. Not on any diabetic  medications at home. Diabetic diet. CBGs stable with Sliding scale insulin.   Coronary artery disease and status post aortic valve replacement  Continue home medications. Stable.   Chronic systolic congestive heart failure  Patient appears compensated. Continue to monitor. Patient is not on ACE or ARB due to chronic kidney disease stage III. Marland Kitchen   Anemia  Due to chronic kidney disease. Hemoglobin stable.   Hyperlipidemia  Stable.   Disposition  Keep inpatient.   DVT Prophylaxis  Subcutaneous heparin.   CODE STATUS  DO NOT RESUSCITATE/DO NOT INTUBATE. This was discussed with the patient and family at the time of admission.   Family Communication  Daughter and wife at bedside   Procedures:  None  CONSULTS:  [facility]   Time spent 3 minutes-which includes 50% of the time with face-to-face with patient/ family and coordinating care related to the above assessment and plan.    MEDICATIONS: Scheduled Meds: . [facility] HOLD] aspirin EC  325 mg Oral Daily  . carvedilol  3.125 mg Oral BID WC  . [MAR HOLD] cefTRIAXone (ROCEPHIN)  IV  2 g Intravenous Q24H  . [facility] HOLD] cholecalciferol  1,000 Units Oral Q supper  . [facility] HOLD] furosemide  40 mg Oral Daily  . [MAR HOLD] heparin  5,000 Units Subcutaneous 3 times per day  . hydrALAZINE  50 mg Oral TID  . [MAR HOLD] insulin aspart  0-5 Units Subcutaneous QHS  . [MAR HOLD] insulin aspart  0-9 Units Subcutaneous TID WC  . [facility] HOLD] isosorbide mononitrate  60 mg Oral Daily  . [  MAR HOLD] latanoprost  1 drop Both Eyes QHS  . [MAR HOLD] multivitamin with minerals  1 tablet Oral Daily  . [MAR HOLD] multivitamin-prenatal  1 tablet Oral Q supper  . [MAR HOLD] pantoprazole  40 mg Oral Daily  . [facility] HOLD] tamsulosin  0.4 mg Oral QPC supper   Continuous Infusions: . sodium chloride 20 mL/hr at 07/02/14 2255  . sodium chloride     PRN Meds:.[MAR HOLD] acetaminophen, [MAR HOLD] acetaminophen, [MAR HOLD] hydrALAZINE, [MAR HOLD] ondansetron  (ZOFRAN) IV, [MAR HOLD] ondansetron  Antibiotics: Anti-infectives   Start     Dose/Rate Route Frequency Ordered Stop   07/03/14 1245  ciprofloxacin (CIPRO) IVPB 400 mg     400 mg 200 mL/hr over 60 Minutes Intravenous  Once 07/03/14 1243 07/03/14 1347   07/01/14 1100  [MAR Hold]  cefTRIAXone (ROCEPHIN) 2 g in dextrose 5 % 50 mL IVPB     (On MAR Hold since 07/03/14 1242)   2 g 100 mL/hr over 30 Minutes Intravenous Every 24 hours 07/01/14 1002     06/30/14 1600  piperacillin-tazobactam (ZOSYN) IVPB 2.25 g  Status:  Discontinued     2.25 g 100 mL/hr over 30 Minutes Intravenous Every 6 hours 06/30/14 1042 07/01/14 1002   06/30/14 0000  vancomycin (VANCOCIN) IVPB 750 mg/150 ml premix  Status:  Discontinued     750 mg 150 mL/hr over 60 Minutes Intravenous Every 24 hours 06/29/14 0043 06/29/14 1047   06/29/14 0800  piperacillin-tazobactam (ZOSYN) IVPB 3.375 g  Status:  Discontinued     3.375 g 12.5 mL/hr over 240 Minutes Intravenous Every 8 hours 06/29/14 0043 06/30/14 1042   06/28/14 2330  cefTRIAXone (ROCEPHIN) 1 g in dextrose 5 % 50 mL IVPB  Status:  Discontinued     1 g 100 mL/hr over 30 Minutes Intravenous  Once 06/28/14 2315 06/28/14 2317   06/28/14 2330  azithromycin (ZITHROMAX) 500 mg in dextrose 5 % 250 mL IVPB  Status:  Discontinued     500 mg 250 mL/hr over 60 Minutes Intravenous  Once 06/28/14 2315 06/28/14 2317   06/28/14 2330  piperacillin-tazobactam (ZOSYN) IVPB 3.375 g     3.375 g 12.5 mL/hr over 240 Minutes Intravenous  Once 06/28/14 2317 06/29/14 0744   06/28/14 2330  vancomycin (VANCOCIN) IVPB 1000 mg/200 mL premix     1,000 mg 200 mL/hr over 60 Minutes Intravenous  Once 06/28/14 2317 06/29/14 0000       PHYSICAL EXAM: Vital signs in last 24 hours: Filed Vitals:   07/03/14 0904 07/03/14 1046 07/03/14 1252 07/03/14 1430  BP: 172/65 142/53 167/46   Pulse: 49 50    Temp:   97.5 F (36.4 C) 98.4 F (36.9 C)  TempSrc:   Oral   Resp:   24   Height:      Weight:       SpO2:   100%     Weight change: 0.498 kg (1 lb 1.6 oz) Filed Weights   07/01/14 0509 07/02/14 0550 07/03/14 0529  Weight: 65.6 kg (144 lb 10 oz) 64.501 kg (142 lb 3.2 oz) 65 kg (143 lb 4.8 oz)   Body mass index is 21.79 kg/(m^2).   Gen Exam: Awake and alert with clear speech. Smiling, pleasant, Wife and daughter at bedside.  Neck: Supple, No JVD.  Chest: B/L Clear. No accessory muscle use.  CVS: S1 S2 Regular, no murmurs.  Abdomen: soft, BS +, non tender, non distended. No organomegaly  Extremities: no edema, lower extremities  warm to touch.  Neurologic: Non Focal.   Intake/Output from previous day:  Intake/Output Summary (Last 24 hours) at 07/03/14 1440 Last data filed at 07/03/14 3016  Gross per 24 hour  Intake 231.67 ml  Output    650 ml  Net -418.33 ml     LAB RESULTS: CBC  Recent Labs Lab 06/28/14 2058 06/29/14 0533 06/30/14 0520 07/03/14 1140  WBC 7.1 11.2* 7.2 5.2  HGB 12.1* 10.2* 10.1* 10.8*  HCT 37.5* 31.7* 30.5* 32.3*  PLT 140* 120* 102* 113*  MCV 101.9* 99.7 98.4 98.5  MCH 32.9 32.1 32.6 32.9  MCHC 32.3 32.2 33.1 33.4  RDW 13.9 14.1 14.5 14.2  LYMPHSABS 1.0  --  1.2  --   MONOABS 0.2  --  0.4  --   EOSABS 0.1  --  0.2  --   BASOSABS 0.0  --  0.0  --     Chemistries   Recent Labs Lab 06/28/14 2058 06/29/14 0533 06/30/14 0520 07/02/14 0835 07/03/14 1140  NA 144 143 138 141 139  K 4.7 4.2 3.9 4.6 4.7  CL 100 99 96 100 100  CO2 30 31 29 27 28   GLUCOSE 221* 132* 109* 123* 131*  BUN 36* 41* 46* 38* 34*  CREATININE 2.08* 2.25* 2.82* 2.65* 2.37*  CALCIUM 9.5 8.9 8.8 9.1 9.0    CBG:  Recent Labs Lab 07/02/14 1201 07/02/14 1709 07/02/14 2248 07/03/14 0757 07/03/14 1141  GLUCAP 231* 120* 130* 117* 121*    GFR Estimated Creatinine Clearance: 20.6 ml/min (by C-G formula based on Cr of 2.37).   MICROBIOLOGY: Recent Results (from the past 240 hour(s))  URINE CULTURE     Status: None   Collection Time    06/28/14 10:00 PM       Result Value Ref Range Status   Specimen Description URINE, CLEAN CATCH   Final   Special Requests NONE   Final   Culture  Setup Time     Final   Value: 06/28/2014 23:21     Performed at [place]     Final   Value: NO GROWTH     Performed at [facility]   Culture     Final   Value: NO GROWTH     Performed at [facility]   Report Status 06/30/2014 FINAL   Final  CULTURE, BLOOD (ROUTINE X 2)     Status: None   Collection Time    06/28/14 10:40 PM      Result Value Ref Range Status   Specimen Description BLOOD LEFT ARM   Final   Special Requests BOTTLES DRAWN AEROBIC AND ANAEROBIC [facility] EACH   Final   Culture  Setup Time     Final   Value: 06/29/2014 08:44     Performed at [facility]   Culture     Final   Value: ESCHERICHIA COLI     Note: Gram Stain Report Called to,Read Back By and Verified With: LINDA BUN ON 06/29/2014 AT 7:58P BY WILEJ     Performed at [facility]   Report Status 07/01/2014 FINAL   Final   Organism ID, Bacteria ESCHERICHIA COLI   Final  CULTURE, BLOOD (ROUTINE X 2)     Status: None   Collection Time    06/28/14 10:45 PM      Result Value Ref Range Status   Specimen Description BLOOD LEFT HAND   Final   Special Requests  BOTTLES DRAWN AEROBIC ONLY 8CC   Final   Culture  Setup Time     Final   Value: 06/29/2014 08:43     Performed at [facility]   Culture     Final   Value: ESCHERICHIA COLI     Note: SUSCEPTIBILITIES PERFORMED ON PREVIOUS CULTURE WITHIN THE LAST 5 DAYS.     Note: Gram Stain Report Called to,Read Back By and Verified With: LINDA BUN ON 06/29/2014 AT 7:58P BY Dennard Nip     Performed at [facility]   Report Status 07/01/2014 FINAL   Final    RADIOLOGY STUDIES/RESULTS: Ct Abdomen Pelvis Wo Contrast  06/28/2014   CLINICAL DATA:  Generalized abdominal pain and cramping.  EXAM: CT ABDOMEN AND PELVIS WITHOUT CONTRAST  TECHNIQUE: Multidetector CT imaging of the abdomen and  pelvis was performed following the standard protocol without IV contrast.  COMPARISON:  None.  FINDINGS: Mild nodular densities at the left lung base are nonspecific but could reflect atelectasis. Mild associated scarring is seen. Diffuse coronary artery calcifications are seen. Calcification is also noted at the aortic valve. The patient is status post median sternotomy.  There is partial atrophy of the right hepatic lobe. The liver and spleen are otherwise grossly unremarkable in appearance. The patient is status post cholecystectomy. The pancreas is atrophic; the adrenal glands are unremarkable in appearance.  Mild bilateral renal atrophy is noted, worse on the left. Mild nonspecific perinephric stranding is seen bilaterally. A small 1.0 cm exophytic cyst is noted arising at the lower pole of the right kidney. The kidneys are otherwise unremarkable. There is no evidence of hydronephrosis. No renal or ureteral stones are seen.  No free fluid is identified. There is a moderate to large right inguinal hernia, containing a relatively long segment of ileum, without evidence for obstruction or strangulation at this time. The small bowel is otherwise unremarkable in appearance. A small left inguinal hernia is seen, containing only fat. The stomach is within normal limits. No acute vascular abnormalities are seen. Scattered calcification is seen along the abdominal aorta and its branches.  Postoperative change is noted at the right lower quadrant. The appendix is not definitely seen. Scattered diverticulosis is noted along the descending and proximal sigmoid colon, without evidence of diverticulitis.  The bladder is mildly distended and grossly unremarkable in appearance. The prostate is enlarged, measuring 5.0 cm in transverse dimension, with minimal calcification. No inguinal lymphadenopathy is seen.  No acute osseous abnormalities are identified. Degenerative change is noted at L5, with a prominent Schmorl's node and  associated sclerotic change.  IMPRESSION: 1. No acute abnormality seen to explain the patient's symptoms. 2. Moderate to large right inguinal hernia, containing a relatively long segment of ileum, without evidence for obstruction or strangulation at this time. 3. Small left inguinal hernia, containing only fat. 4. Mild bilateral renal atrophy, worse on the left. Small right renal cyst seen. 5. Partial atrophy of the right hepatic lobe. 6. Scattered nodular densities at the left lung base are nonspecific, but could reflect atelectasis. Mild associated scarring seen. 7. Diffuse coronary artery calcifications noted. Calcification noted at the mitral valve. 8. Scattered diverticulosis along the descending and proximal sigmoid colon, without evidence of diverticulitis. 9. Enlarged prostate noted. 10. Scattered calcification along the abdominal aorta and branches.   Electronically Signed   By: Garald Balding M.D.   On: 06/28/2014 22:15   Dg Chest 2 View  06/28/2014   CLINICAL DATA:  Diffuse abdominal pain.  EXAM:  CHEST  2 VIEW  COMPARISON:  Chest radiograph performed 04/11/2013  FINDINGS: The lungs are well-aerated. Mild left basilar airspace opacity may reflect atelectasis or possibly pneumonia. Mild vascular congestion is noted. There is no evidence of pleural effusion or pneumothorax.  The heart is mildly enlarged. The patient is status post median sternotomy. An aortic valve replacement is noted. No acute osseous abnormalities are seen.  IMPRESSION: 1. Mild left basilar airspace opacity may reflect atelectasis or possibly mild pneumonia. 2. Mild vascular congestion and mild cardiomegaly.   Electronically Signed   By: Garald Balding M.D.   On: 06/28/2014 23:08   Mr Abdomen Mrcp Wo Cm  07/01/2014   CLINICAL DATA:  Fever and elevated LFTs.  EXAM: MRI ABDOMEN WITHOUT  (INCLUDING MRCP)  TECHNIQUE: Multiplanar multisequence MR imaging of the abdomen was performed. Heavily T2-weighted images of the biliary and  pancreatic ducts were obtained, and three-dimensional MRCP images were rendered by post processing.  COMPARISON:  07/01/2014  FINDINGS: Small right pleural effusion is identified.  Atrophy of the right hepatic lobe identified. The patient is status post coli cystectomy. No focal liver abnormality identified. The common bile duct is increased in caliber measuring up to 8 mm. Within the proximal common bile duct there is a stone measuring 8 mm. In the distal common bile duct there are 3 stones measuring up to 7 mm. There is mild intrahepatic bile duct dilatation. There is volume loss from the liver parenchyma. There are multiple T2 hyperintense and T1 hypo intense structures within the head and tail of pancreas. The largest appears bilobed and is in the tail of pancreas measuring 1.4 cm. The spleen appears normal.  The adrenal glands are both within normal limits. 1 cm cyst is noted within the upper pole of the right kidney. Asymmetric cortical atrophy of the left kidney noted. There is a 5.2 cm cyst arising from the upper pole the left kidney. Normal appearance of the abdominal aorta. No retroperitoneal adenopathy identified.  A small amount of perihepatic ascites is identified.  IMPRESSION: 1. Increase caliber of the common bile duct which contains multiple stones. 2. Prior cholecystectomy with atrophy of the right hepatic lobe. 3. Atrophy of the pancreatic parenchyma which contains multiple cystic structures. If there is a history of pancreatitis these findings may reflect multiple pseudocysts. Differential considerations include branch duct IPMNs and multiple serous cystadenoma. Followup examination in 1 year with MRI is recommended. Preferably this should be performed with contrast material. 4. Left renal atrophy. 5. Bilateral renal cysts. 6. Small right pleural effusion.   Electronically Signed   By: Kerby Moors M.D.   On: 07/01/2014 08:45   Mr 3d Recon At Scanner  07/01/2014   CLINICAL DATA:  Fever and  elevated LFTs.  EXAM: MRI ABDOMEN WITHOUT  (INCLUDING MRCP)  TECHNIQUE: Multiplanar multisequence MR imaging of the abdomen was performed. Heavily T2-weighted images of the biliary and pancreatic ducts were obtained, and three-dimensional MRCP images were rendered by post processing.  COMPARISON:  07/01/2014  FINDINGS: Small right pleural effusion is identified.  Atrophy of the right hepatic lobe identified. The patient is status post coli cystectomy. No focal liver abnormality identified. The common bile duct is increased in caliber measuring up to 8 mm. Within the proximal common bile duct there is a stone measuring 8 mm. In the distal common bile duct there are 3 stones measuring up to 7 mm. There is mild intrahepatic bile duct dilatation. There is volume loss from the liver parenchyma. There  are multiple T2 hyperintense and T1 hypo intense structures within the head and tail of pancreas. The largest appears bilobed and is in the tail of pancreas measuring 1.4 cm. The spleen appears normal.  The adrenal glands are both within normal limits. 1 cm cyst is noted within the upper pole of the right kidney. Asymmetric cortical atrophy of the left kidney noted. There is a 5.2 cm cyst arising from the upper pole the left kidney. Normal appearance of the abdominal aorta. No retroperitoneal adenopathy identified.  A small amount of perihepatic ascites is identified.  IMPRESSION: 1. Increase caliber of the common bile duct which contains multiple stones. 2. Prior cholecystectomy with atrophy of the right hepatic lobe. 3. Atrophy of the pancreatic parenchyma which contains multiple cystic structures. If there is a history of pancreatitis these findings may reflect multiple pseudocysts. Differential considerations include branch duct IPMNs and multiple serous cystadenoma. Followup examination in 1 year with MRI is recommended. Preferably this should be performed with contrast material. 4. Left renal atrophy. 5. Bilateral  renal cysts. 6. Small right pleural effusion.   Electronically Signed   By: Kerby Moors M.D.   On: 07/01/2014 08:45    Janifer Adie, PA-S, [facility]  [place], [place] [facility] Pager:[phone]  If 7PM-7AM, please contact night-coverage [email or web address] Password [email or web address] 07/03/2014, 2:40 PM   LOS: 5 days   **Disclaimer: This note may have been dictated with voice recognition software. Similar sounding words can inadvertently be transcribed and this note may contain transcription errors which may not have been corrected upon publication of note.**  Pt seen and examined. Agree with assessment and plan as outlined above. Pt presents with CBD blockage with ERCP planned for today. Cont on rocephin for ecoli bacteremia. Marylu Lund, MD [phone]

## 2014-07-03 NOTE — Consult Note (Signed)
I was consulted to see patient concerning upper esophageal stricture. Patient was admitted with sepsis secondary to gallstones in the hepatic duct and ERCP was attempted today but Dr Paulita Fujita was unable to pass the duodenoscope because of a tight esophageal stricture just distal to the UES.  I was consulted concerning dilation of the stricture in order to perform ERCP for choledocholithiasis. I have previously dilated this stricture in 2002, 2005 and the last time in march of 2012 to 20 mm using the savory dilators. Patient hasn't really described that much trouble swallowing but his wife has stated that his eating has become much slower. Discussed patient with Dr Paulita Fujita. He and I feel that this would best be performed at the same time in the OR early next week , hopefully Mon or Tues  Am. Dr Paulita Fujita will arrange scheduling this.

## 2014-07-03 NOTE — Op Note (Signed)
[facility] [place], [id number]   ENDOSCOPY PROCEDURE REPORT  PATIENT: [name] [name], [name] [name]  MR#: [id number] BIRTHDATE: [birth year] , 28  yrs. old GENDER: Male ENDOSCOPIST: Arta Silence, MD REFERRED BY:  [facility] PROCEDURE DATE:  07/03/2014 PROCEDURE:  Esophagoscopy ASA CLASS:     Class III INDICATIONS:  choledocholithiasis. MEDICATIONS: General endotracheal anesthesia (GETA) TOPICAL ANESTHETIC:  DESCRIPTION OF PROCEDURE: After the risks benefits and alternatives of the procedure were thoroughly explained, informed consent was obtained.  The diagnostic forward-viewing endoscope followed by the side-viewing duodenoscope were introduced through the mouth and advanced to the esophagus upper. Without limitations.  The instrument was slowly withdrawn as the mucosa was fully examined.    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  Luminal diameter estimated to be 7-8 mm.  Gentle attempts were made to traverse the stricture, with both endoscope and duodenoscope, all unsuccessfully.  I do not feel comfort dilating such a tight and such a very proximal cervical esophageal web.  Procedure accordingly aborted.           The scope was then withdrawn from the patient and the procedure completed.  ENDOSCOPIC IMPRESSION:     Tight very proximal cervical esophageal web, unable to traverse endoscope to reach duodenum, therefore ERCP procedure aborted.  RECOMMENDATIONS:     1.  Watch for potential complications of procedure. 2.  Needs ENT consult with Dr. Melony Overly (who has dilated patient's esophagus on a number of occasions in the past) for consideration of dilatation of his esophageal web, early next week. ERCP can perhaps be considered conjointly (preferably) with the esophageal dilatation.  However, given  I am concerned that it may take a couple dilatation sessions in order to  safely dilate the esophagus enough to permit passage of the duodenoscope. Fortunately, patient is improving on antibiotics, and I don't feel that the need of ERCP is emergently required. 3.  Case discussed Erma Heritage, of the [facility] team.  eSigned:  Arta Silence, MD 07/03/2014 2:19 PM   CC:

## 2014-07-03 NOTE — Interval H&P Note (Signed)
History and Physical Interval Note:  07/03/2014 1:19 PM  [name] [name]  has presented today for surgery, with the diagnosis of bile duct stones  The various methods of treatment have been discussed with the patient and family. After consideration of risks, benefits and other options for treatment, the patient has consented to  Procedure(s): ENDOSCOPIC RETROGRADE CHOLANGIOPANCREATOGRAPHY (ERCP) (N/A) as a surgical intervention .  The patient's history has been reviewed, patient examined, no change in status, stable for surgery.  I have reviewed the patient's chart and labs.  Questions were answered to the patient's satisfaction.     Jos Cygan M  Assessment:  1.  Choledocholithiasis, with preceding history of abdominal pain and gram-negative bacteremia, worrisome for cholangitis.  Abdominal pain resolved and patient is in no distress at this time. 2.  History cervical esophageal web, with periodic esophageal dilatations by Dr. Lucia Gaskins, last such procedure reportedly done 2-3 years ago (done at outpatient surgical center, I don't see any records here).  Plan:  1.  Forward-viewing endoscopy of the esophagus to make sure there is no obvious obstructing pathology that would preclude passage of the side-viewing duodenoscope. 2.  If endoscopy is ok, will proceed with endoscopic retrograde cholangiopancreatography with possible biliary sphincterotomy and possible bile duct stone extraction(s). 3.  Risks (up to and including bleeding, infection, perforation, pancreatitis that can be complicated by infected necrosis and death), benefits (removal of stones, alleviating blockage, decreasing risk of cholangitis or choledocholithiasis-related pancreatitis), and alternatives (watchful waiting, percutaneous transhepatic cholangiography) of ERCP were explained to patient/family in detail and patient elects to proceed.

## 2014-07-03 NOTE — Progress Notes (Signed)
Patient has history of esophageal stricture. On inspection poster pharynx and upper esophageal sphincter patient noted to have very tight stricture. Neither EGD nor ERCP can be passed through stricture procedure cancelled by Dr Paulita Fujita.

## 2014-07-04 LAB — COMPREHENSIVE METABOLIC PANEL
ALT: 69 U/L — ABNORMAL HIGH (ref 0–53)
AST: 38 U/L — ABNORMAL HIGH (ref 0–37)
Albumin: 2.5 g/dL — ABNORMAL LOW (ref 3.5–5.2)
Alkaline Phosphatase: 302 U/L — ABNORMAL HIGH (ref 39–117)
Anion gap: 15 (ref 5–15)
BILIRUBIN TOTAL: 1.1 mg/dL (ref 0.3–1.2)
BUN: 29 mg/dL — ABNORMAL HIGH (ref 6–23)
CHLORIDE: 98 meq/L (ref 96–112)
CO2: 26 meq/L (ref 19–32)
Calcium: 8.9 mg/dL (ref 8.4–10.5)
Creatinine, Ser: 2.28 mg/dL — ABNORMAL HIGH (ref 0.50–1.35)
GFR calc Af Amer: 28 mL/min — ABNORMAL LOW (ref >90)
GFR, EST NON AFRICAN AMERICAN: 24 mL/min — AB (ref >90)
Glucose, Bld: 119 mg/dL — ABNORMAL HIGH (ref 70–99)
Potassium: 3.9 mEq/L (ref 3.7–5.3)
Sodium: 139 mEq/L (ref 137–147)
Total Protein: 6.2 g/dL (ref 6.0–8.3)

## 2014-07-04 LAB — CBC
HCT: 33.8 % — ABNORMAL LOW (ref 39.0–52.0)
HEMOGLOBIN: 10.9 g/dL — AB (ref 13.0–17.0)
MCH: 31.5 pg (ref 26.0–34.0)
MCHC: 32.2 g/dL (ref 30.0–36.0)
MCV: 97.7 fL (ref 78.0–100.0)
PLATELETS: 125 10*3/uL — AB (ref 150–400)
RBC: 3.46 MIL/uL — AB (ref 4.22–5.81)
RDW: 14.5 % (ref 11.5–15.5)
WBC: 5.6 10*3/uL (ref 4.0–10.5)

## 2014-07-04 LAB — GLUCOSE, CAPILLARY
Glucose-Capillary: 128 mg/dL — ABNORMAL HIGH (ref 70–99)
Glucose-Capillary: 168 mg/dL — ABNORMAL HIGH (ref 70–99)
Glucose-Capillary: 104 mg/dL — ABNORMAL HIGH (ref 70–99)
Glucose-Capillary: 187 mg/dL — ABNORMAL HIGH (ref 70–99)

## 2014-07-04 NOTE — Progress Notes (Signed)
[facility] Progress Note  Subjective: No complaints of abdominal pain. Feels good.  Objective: Vital signs in last 24 hours: Temp:  [98.2 F (36.8 C)-98.8 F (37.1 C)] 98.2 F (36.8 C) (07/25 0455) Pulse Rate:  [48-65] 56 (07/25 0946) Resp:  [11-18] 18 (07/25 0455) BP: (146-160)/(50-69) 154/50 mmHg (07/25 0946) SpO2:  [97 %-100 %] 97 % (07/25 0455) Weight:  [65.9 kg (145 lb 4.5 oz)] 65.9 kg (145 lb 4.5 oz) (07/25 0455) Weight change: 0.9 kg (1 lb 15.8 oz)   PE: Non icteric Abdomen soft  Lab Results: Results for orders placed during the hospital encounter of 06/28/14 (from the past 24 hour(s))  GLUCOSE, CAPILLARY     Status: Abnormal   Collection Time    07/03/14  5:51 PM      Result Value Ref Range   Glucose-Capillary 103 (*) 70 - 99 mg/dL  GLUCOSE, CAPILLARY     Status: Abnormal   Collection Time    07/03/14  9:23 PM      Result Value Ref Range   Glucose-Capillary 165 (*) 70 - 99 mg/dL  CBC     Status: Abnormal   Collection Time    07/04/14  4:40 AM      Result Value Ref Range   WBC 5.6  4.0 - 10.5 K/uL   RBC 3.46 (*) 4.22 - 5.81 MIL/uL   Hemoglobin 10.9 (*) 13.0 - 17.0 g/dL   HCT 33.8 (*) 39.0 - 52.0 %   MCV 97.7  78.0 - 100.0 fL   MCH 31.5  26.0 - 34.0 pg   MCHC 32.2  30.0 - 36.0 g/dL   RDW 14.5  11.5 - 15.5 %   Platelets 125 (*) 150 - 400 K/uL  COMPREHENSIVE METABOLIC PANEL     Status: Abnormal   Collection Time    07/04/14  4:40 AM      Result Value Ref Range   Sodium 139  137 - 147 mEq/L   Potassium 3.9  3.7 - 5.3 mEq/L   Chloride 98  96 - 112 mEq/L   CO2 26  19 - 32 mEq/L   Glucose, Bld 119 (*) 70 - 99 mg/dL   BUN 29 (*) 6 - 23 mg/dL   Creatinine, Ser 2.28 (*) 0.50 - 1.35 mg/dL   Calcium 8.9  8.4 - 10.5 mg/dL   Total Protein 6.2  6.0 - 8.3 g/dL   Albumin 2.5 (*) 3.5 - 5.2 g/dL   AST 38 (*) 0 - 37 U/L   ALT 69 (*) 0 - 53 U/L   Alkaline Phosphatase 302 (*) 39 - 117 U/L   Total Bilirubin 1.1  0.3 - 1.2 mg/dL   GFR calc non Af Amer 24  (*) >90 mL/min   GFR calc Af Amer 28 (*) >90 mL/min   Anion gap 15  5 - 15  GLUCOSE, CAPILLARY     Status: Abnormal   Collection Time    07/04/14  7:56 AM      Result Value Ref Range   Glucose-Capillary 128 (*) 70 - 99 mg/dL  GLUCOSE, CAPILLARY     Status: Abnormal   Collection Time    07/04/14 11:39 AM      Result Value Ref Range   Glucose-Capillary 168 (*) 70 - 99 mg/dL    Studies/Results: No results found.    Assessment: CBD stones Proximal esophageal web  Plan: For esophageal dilation and ERCP next week. Continue antibiotics.    Tyqwan Pink F 07/04/2014, 1:55 PM  Lab Results  Component Value Date   HGB 10.9* 07/04/2014   HGB 10.8* 07/03/2014   HGB 10.1* 06/30/2014   HGB 11.7* 06/03/2014   HGB 10.0* 05/07/2014   HGB 11.1* 04/01/2014   HCT 33.8* 07/04/2014   HCT 32.3* 07/03/2014   HCT 30.5* 06/30/2014   HCT 36.1* 06/03/2014   HCT 30.7* 05/07/2014   HCT 34.3* 04/01/2014   ALKPHOS 302* 07/04/2014   ALKPHOS 313* 07/03/2014   ALKPHOS 307* 07/02/2014   ALKPHOS 73 09/04/2012   AST 38* 07/04/2014   AST 33 07/03/2014   AST 32 07/02/2014   AST 18 09/04/2012   ALT 69* 07/04/2014   ALT 71* 07/03/2014   ALT 90* 07/02/2014   ALT 21 09/04/2012

## 2014-07-04 NOTE — Progress Notes (Signed)
PATIENT DETAILS Name: [name] [name] Age: 80 y.o. Sex: male Date of Birth: 01-02-1926 Admit Date: 06/28/2014 Admitting Physician [name] Bellows, MD [id number], [name], DO  Subjective: Doing fine, same as yesterday.  No new complaints.  Denies abdominal pain, chest pain, SOB, N/V.  BM this morning described as being a little runny which he associates to the soft diet he at last night.  Assessment/Plan: Choledocholithiasis with abdominal pain  - Pain resolved.  - Was associated with fever and elevated LFTs which have been treading down.   - MRCP done 7/22 and showed multiple stones in the CBD and duct dilitation  - ERCP was attempted by Dr. Paulita [name] but unfortunately a tight esophageal stricture prevented even the 10 mm diameter endoscope from passing thru his esophagus.  Dr. Radene [name] (ENT) has performed previous esophageal dilations on [name] [name].  Dr. Paulita [name] requested that Dr. Lucia [name] be consulted for esophageal dilation after which the ERCP will be re-attempted.  Gram Neg Bacteremia  -Blood culture with 2/2 e. coli. Transitioned to rocephin per sensitivities  -Source felt to be related to choledocholithiasis. Anticipate 14 days of antibiotics.  Plan on ceftin on discharge to complete course of abx  Acute on Chronic kidney disease stage III .  -baseline creatinine appears to be 2.1 - 2.2  -Near baseline currently  Hypertension  Patient has been slightly bradycardic and BP has been elevated.   Decreased coreg to 3.125 bid and increased hydralazine to 50 mg TID.   No ACE/ARB due to CKD.  Type 2 diabetes  Diet controlled. Not on any diabetic medications at home. Diabetic diet. CBGs stable with Sliding scale insulin.   Coronary artery disease and status post aortic valve replacement  Continue home medications. Stable.   Chronic systolic congestive heart failure  Patient appears compensated. Continue to monitor. Patient is not on ACE or ARB due to chronic kidney disease  stage III. Marland Kitchen   Anemia  Due to chronic kidney disease. Hemoglobin stable.   Hyperlipidemia  Stable.   Disposition  Keep inpatient.   DVT Prophylaxis  Subcutaneous heparin.   CODE STATUS  DO NOT RESUSCITATE/DO NOT INTUBATE. This was discussed with the patient and family at the time of admission.   Family Communication  Daughter and wife at bedside   Procedures:  None  CONSULTS:  [facility]   Time spent 50 minutes-which includes 50% of the time with face-to-face with patient/ family and coordinating care related to the above assessment and plan.  MEDICATIONS: Scheduled Meds: . aspirin EC  325 mg Oral Daily  . carvedilol  3.125 mg Oral BID WC  . cefTRIAXone (ROCEPHIN)  IV  2 g Intravenous Q24H  . cholecalciferol  1,000 Units Oral Q supper  . furosemide  40 mg Oral Daily  . heparin  5,000 Units Subcutaneous 3 times per day  . hydrALAZINE  50 mg Oral TID  . insulin aspart  0-5 Units Subcutaneous QHS  . insulin aspart  0-9 Units Subcutaneous TID WC  . isosorbide mononitrate  60 mg Oral Daily  . latanoprost  1 drop Both Eyes QHS  . multivitamin with minerals  1 tablet Oral Daily  . multivitamin-prenatal  1 tablet Oral Q supper  . pantoprazole  40 mg Oral Daily  . tamsulosin  0.4 mg Oral QPC supper   Continuous Infusions:   PRN Meds:.acetaminophen, acetaminophen, hydrALAZINE, ondansetron (ZOFRAN) IV, ondansetron  Antibiotics: Anti-infectives   Start     Dose/Rate Route Frequency  Ordered Stop   07/03/14 1245  ciprofloxacin (CIPRO) IVPB 400 mg     400 mg 200 mL/hr over 60 Minutes Intravenous  Once 07/03/14 1243 07/03/14 1347   07/01/14 1100  cefTRIAXone (ROCEPHIN) 2 g in dextrose 5 % 50 mL IVPB     2 g 100 mL/hr over 30 Minutes Intravenous Every 24 hours 07/01/14 1002     06/30/14 1600  piperacillin-tazobactam (ZOSYN) IVPB 2.25 g  Status:  Discontinued     2.25 g 100 mL/hr over 30 Minutes Intravenous Every 6 hours 06/30/14 1042 07/01/14 1002   06/30/14 0000   vancomycin (VANCOCIN) IVPB 750 mg/150 ml premix  Status:  Discontinued     750 mg 150 mL/hr over 60 Minutes Intravenous Every 24 hours 06/29/14 0043 06/29/14 1047   06/29/14 0800  piperacillin-tazobactam (ZOSYN) IVPB 3.375 g  Status:  Discontinued     3.375 g 12.5 mL/hr over 240 Minutes Intravenous Every 8 hours 06/29/14 0043 06/30/14 1042   06/28/14 2330  cefTRIAXone (ROCEPHIN) 1 g in dextrose 5 % 50 mL IVPB  Status:  Discontinued     1 g 100 mL/hr over 30 Minutes Intravenous  Once 06/28/14 2315 06/28/14 2317   06/28/14 2330  azithromycin (ZITHROMAX) 500 mg in dextrose 5 % 250 mL IVPB  Status:  Discontinued     500 mg 250 mL/hr over 60 Minutes Intravenous  Once 06/28/14 2315 06/28/14 2317   06/28/14 2330  piperacillin-tazobactam (ZOSYN) IVPB 3.375 g     3.375 g 12.5 mL/hr over 240 Minutes Intravenous  Once 06/28/14 2317 06/29/14 0744   06/28/14 2330  vancomycin (VANCOCIN) IVPB 1000 mg/200 mL premix     1,000 mg 200 mL/hr over 60 Minutes Intravenous  Once 06/28/14 2317 06/29/14 0000       PHYSICAL EXAM: Vital signs in last 24 hours: Filed Vitals:   07/04/14 0455 07/04/14 0717 07/04/14 0946 07/04/14 1359  BP: 149/58  154/50 131/55  Pulse: 57 48 56 54  Temp: 98.2 F (36.8 C)   97.5 F (36.4 C)  TempSrc: Oral   Oral  Resp: 18   16  Height:      Weight: 65.9 kg (145 lb 4.5 oz)     SpO2: 97%   100%    Weight change: 0.9 kg (1 lb 15.8 oz) Filed Weights   07/02/14 0550 07/03/14 0529 07/04/14 0455  Weight: 64.501 kg (142 lb 3.2 oz) 65 kg (143 lb 4.8 oz) 65.9 kg (145 lb 4.5 oz)   Body mass index is 22.1 kg/(m^2).   Gen Exam: Awake and alert with clear speech. Smiling, pleasant, Wife and daughter at bedside.  Neck: Supple, No JVD.  Chest: B/L Clear. No accessory muscle use.  CVS: S1 S2 Regular, no murmurs.  Abdomen: soft, BS +, non tender, non distended. No organomegaly  Extremities: no edema, lower extremities warm to touch.  Neurologic: Non Focal.   Intake/Output from  previous day:  Intake/Output Summary (Last 24 hours) at 07/04/14 1641 Last data filed at 07/04/14 1300  Gross per 24 hour  Intake    640 ml  Output    425 ml  Net    215 ml     LAB RESULTS: CBC  Recent Labs Lab 06/28/14 2058 06/29/14 0533 06/30/14 0520 07/03/14 1140 07/04/14 0440  WBC 7.1 11.2* 7.2 5.2 5.6  HGB 12.1* 10.2* 10.1* 10.8* 10.9*  HCT 37.5* 31.7* 30.5* 32.3* 33.8*  PLT 140* 120* 102* 113* 125*  MCV 101.9* 99.7 98.4 98.5 97.7  MCH 32.9 32.1 32.6 32.9 31.5  MCHC 32.3 32.2 33.1 33.4 32.2  RDW 13.9 14.1 14.5 14.2 14.5  LYMPHSABS 1.0  --  1.2  --   --   MONOABS 0.2  --  0.4  --   --   EOSABS 0.1  --  0.2  --   --   BASOSABS 0.0  --  0.0  --   --     Chemistries   Recent Labs Lab 06/29/14 0533 06/30/14 0520 07/02/14 0835 07/03/14 1140 07/04/14 0440  NA 143 138 141 139 139  K 4.2 3.9 4.6 4.7 3.9  CL 99 96 100 100 98  CO2 31 29 27 28 26   GLUCOSE 132* 109* 123* 131* 119*  BUN 41* 46* 38* 34* 29*  CREATININE 2.25* 2.82* 2.65* 2.37* 2.28*  CALCIUM 8.9 8.8 9.1 9.0 8.9    CBG:  Recent Labs Lab 07/03/14 1141 07/03/14 1751 07/03/14 2123 07/04/14 0756 07/04/14 1139  GLUCAP 121* 103* 165* 128* 168*    GFR Estimated Creatinine Clearance: 21.7 ml/min (by C-G formula based on Cr of 2.28).   MICROBIOLOGY: Recent Results (from the past 240 hour(s))  URINE CULTURE     Status: None   Collection Time    06/28/14 10:00 PM      Result Value Ref Range Status   Specimen Description URINE, CLEAN CATCH   Final   Special Requests NONE   Final   Culture  Setup Time     Final   Value: 06/28/2014 23:21     Performed at [facility]     Final   Value: NO GROWTH     Performed at [facility]   Culture     Final   Value: NO GROWTH     Performed at [facility]   Report Status 06/30/2014 FINAL   Final  CULTURE, BLOOD (ROUTINE X 2)     Status: None   Collection Time    06/28/14 10:40 PM      Result Value Ref Range  Status   Specimen Description BLOOD LEFT ARM   Final   Special Requests BOTTLES DRAWN AEROBIC AND ANAEROBIC [facility] EACH   Final   Culture  Setup Time     Final   Value: 06/29/2014 08:44     Performed at [facility]   Culture     Final   Value: ESCHERICHIA COLI     Note: Gram Stain Report Called to,Read Back By and Verified With: LINDA BUN ON 06/29/2014 AT 7:58P BY WILEJ     Performed at [facility]   Report Status 07/01/2014 FINAL   Final   Organism ID, Bacteria ESCHERICHIA COLI   Final  CULTURE, BLOOD (ROUTINE X 2)     Status: None   Collection Time    06/28/14 10:45 PM      Result Value Ref Range Status   Specimen Description BLOOD LEFT HAND   Final   Special Requests BOTTLES DRAWN AEROBIC ONLY 8CC   Final   Culture  Setup Time     Final   Value: 06/29/2014 08:43     Performed at [facility]   Culture     Final   Value: ESCHERICHIA COLI     Note: SUSCEPTIBILITIES PERFORMED ON PREVIOUS CULTURE WITHIN THE LAST 5 DAYS.     Note: Gram Stain Report Called to,Read Back By and Verified With: LINDA BUN ON 06/29/2014 AT 7:58P BY  WILEJ     Performed at [facility]   Report Status 07/01/2014 FINAL   Final    RADIOLOGY STUDIES/RESULTS: Ct Abdomen Pelvis Wo Contrast  06/28/2014   CLINICAL DATA:  Generalized abdominal pain and cramping.  EXAM: CT ABDOMEN AND PELVIS WITHOUT CONTRAST  TECHNIQUE: Multidetector CT imaging of the abdomen and pelvis was performed following the standard protocol without IV contrast.  COMPARISON:  None.  FINDINGS: Mild nodular densities at the left lung base are nonspecific but could reflect atelectasis. Mild associated scarring is seen. Diffuse coronary artery calcifications are seen. Calcification is also noted at the aortic valve. The patient is status post median sternotomy.  There is partial atrophy of the right hepatic lobe. The liver and spleen are otherwise grossly unremarkable in appearance. The patient is status post  cholecystectomy. The pancreas is atrophic; the adrenal glands are unremarkable in appearance.  Mild bilateral renal atrophy is noted, worse on the left. Mild nonspecific perinephric stranding is seen bilaterally. A small 1.0 cm exophytic cyst is noted arising at the lower pole of the right kidney. The kidneys are otherwise unremarkable. There is no evidence of hydronephrosis. No renal or ureteral stones are seen.  No free fluid is identified. There is a moderate to large right inguinal hernia, containing a relatively long segment of ileum, without evidence for obstruction or strangulation at this time. The small bowel is otherwise unremarkable in appearance. A small left inguinal hernia is seen, containing only fat. The stomach is within normal limits. No acute vascular abnormalities are seen. Scattered calcification is seen along the abdominal aorta and its branches.  Postoperative change is noted at the right lower quadrant. The appendix is not definitely seen. Scattered diverticulosis is noted along the descending and proximal sigmoid colon, without evidence of diverticulitis.  The bladder is mildly distended and grossly unremarkable in appearance. The prostate is enlarged, measuring 5.0 cm in transverse dimension, with minimal calcification. No inguinal lymphadenopathy is seen.  No acute osseous abnormalities are identified. Degenerative change is noted at L5, with a prominent Schmorl's node and associated sclerotic change.  IMPRESSION: 1. No acute abnormality seen to explain the patient's symptoms. 2. Moderate to large right inguinal hernia, containing a relatively long segment of ileum, without evidence for obstruction or strangulation at this time. 3. Small left inguinal hernia, containing only fat. 4. Mild bilateral renal atrophy, worse on the left. Small right renal cyst seen. 5. Partial atrophy of the right hepatic lobe. 6. Scattered nodular densities at the left lung base are nonspecific, but could  reflect atelectasis. Mild associated scarring seen. 7. Diffuse coronary artery calcifications noted. Calcification noted at the mitral valve. 8. Scattered diverticulosis along the descending and proximal sigmoid colon, without evidence of diverticulitis. 9. Enlarged prostate noted. 10. Scattered calcification along the abdominal aorta and branches.   Electronically Signed   By: Garald Balding M.D.   On: 06/28/2014 22:15   Dg Chest 2 View  06/28/2014   CLINICAL DATA:  Diffuse abdominal pain.  EXAM: CHEST  2 VIEW  COMPARISON:  Chest radiograph performed 04/11/2013  FINDINGS: The lungs are well-aerated. Mild left basilar airspace opacity may reflect atelectasis or possibly pneumonia. Mild vascular congestion is noted. There is no evidence of pleural effusion or pneumothorax.  The heart is mildly enlarged. The patient is status post median sternotomy. An aortic valve replacement is noted. No acute osseous abnormalities are seen.  IMPRESSION: 1. Mild left basilar airspace opacity may reflect atelectasis or possibly mild pneumonia. 2.  Mild vascular congestion and mild cardiomegaly.   Electronically Signed   By: Garald Balding M.D.   On: 06/28/2014 23:08   Mr Abdomen Mrcp Wo Cm  07/01/2014   CLINICAL DATA:  Fever and elevated LFTs.  EXAM: MRI ABDOMEN WITHOUT  (INCLUDING MRCP)  TECHNIQUE: Multiplanar multisequence MR imaging of the abdomen was performed. Heavily T2-weighted images of the biliary and pancreatic ducts were obtained, and three-dimensional MRCP images were rendered by post processing.  COMPARISON:  07/01/2014  FINDINGS: Small right pleural effusion is identified.  Atrophy of the right hepatic lobe identified. The patient is status post coli cystectomy. No focal liver abnormality identified. The common bile duct is increased in caliber measuring up to 8 mm. Within the proximal common bile duct there is a stone measuring 8 mm. In the distal common bile duct there are 3 stones measuring up to 7 mm. There is  mild intrahepatic bile duct dilatation. There is volume loss from the liver parenchyma. There are multiple T2 hyperintense and T1 hypo intense structures within the head and tail of pancreas. The largest appears bilobed and is in the tail of pancreas measuring 1.4 cm. The spleen appears normal.  The adrenal glands are both within normal limits. 1 cm cyst is noted within the upper pole of the right kidney. Asymmetric cortical atrophy of the left kidney noted. There is a 5.2 cm cyst arising from the upper pole the left kidney. Normal appearance of the abdominal aorta. No retroperitoneal adenopathy identified.  A small amount of perihepatic ascites is identified.  IMPRESSION: 1. Increase caliber of the common bile duct which contains multiple stones. 2. Prior cholecystectomy with atrophy of the right hepatic lobe. 3. Atrophy of the pancreatic parenchyma which contains multiple cystic structures. If there is a history of pancreatitis these findings may reflect multiple pseudocysts. Differential considerations include branch duct IPMNs and multiple serous cystadenoma. Followup examination in 1 year with MRI is recommended. Preferably this should be performed with contrast material. 4. Left renal atrophy. 5. Bilateral renal cysts. 6. Small right pleural effusion.   Electronically Signed   By: Kerby Moors M.D.   On: 07/01/2014 08:45   Mr 3d Recon At Scanner  07/01/2014   CLINICAL DATA:  Fever and elevated LFTs.  EXAM: MRI ABDOMEN WITHOUT  (INCLUDING MRCP)  TECHNIQUE: Multiplanar multisequence MR imaging of the abdomen was performed. Heavily T2-weighted images of the biliary and pancreatic ducts were obtained, and three-dimensional MRCP images were rendered by post processing.  COMPARISON:  07/01/2014  FINDINGS: Small right pleural effusion is identified.  Atrophy of the right hepatic lobe identified. The patient is status post coli cystectomy. No focal liver abnormality identified. The common bile duct is increased  in caliber measuring up to 8 mm. Within the proximal common bile duct there is a stone measuring 8 mm. In the distal common bile duct there are 3 stones measuring up to 7 mm. There is mild intrahepatic bile duct dilatation. There is volume loss from the liver parenchyma. There are multiple T2 hyperintense and T1 hypo intense structures within the head and tail of pancreas. The largest appears bilobed and is in the tail of pancreas measuring 1.4 cm. The spleen appears normal.  The adrenal glands are both within normal limits. 1 cm cyst is noted within the upper pole of the right kidney. Asymmetric cortical atrophy of the left kidney noted. There is a 5.2 cm cyst arising from the upper pole the left kidney. Normal appearance of the abdominal  aorta. No retroperitoneal adenopathy identified.  A small amount of perihepatic ascites is identified.  IMPRESSION: 1. Increase caliber of the common bile duct which contains multiple stones. 2. Prior cholecystectomy with atrophy of the right hepatic lobe. 3. Atrophy of the pancreatic parenchyma which contains multiple cystic structures. If there is a history of pancreatitis these findings may reflect multiple pseudocysts. Differential considerations include branch duct IPMNs and multiple serous cystadenoma. Followup examination in 1 year with MRI is recommended. Preferably this should be performed with contrast material. 4. Left renal atrophy. 5. Bilateral renal cysts. 6. Small right pleural effusion.   Electronically Signed   By: Kerby Moors M.D.   On: 07/01/2014 08:45    Janifer Adie, PA-S, [facility]  [place], [place] [facility] Pager:[phone]  If 7PM-7AM, please contact night-coverage [email or web address] Password [email or web address] 07/04/2014, 4:41 PM   LOS: 6 days   **Disclaimer: This note may have been dictated with voice recognition software. Similar sounding words can inadvertently be transcribed and this note may contain transcription errors which may  not have been corrected upon publication of note.**  Pt seen and examined. Agree with assessment and plan as outlined above. Pt presents with CBD blockage with ERCP planned for today. Cont on rocephin for ecoli bacteremia. Marylu Lund, MD [phone]

## 2014-07-05 ENCOUNTER — Encounter (HOSPITAL_COMMUNITY): Payer: Self-pay | Admitting: *Deleted

## 2014-07-05 LAB — GLUCOSE, CAPILLARY
GLUCOSE-CAPILLARY: 140 mg/dL — AB (ref 70–99)
GLUCOSE-CAPILLARY: 161 mg/dL — AB (ref 70–99)
GLUCOSE-CAPILLARY: 121 mg/dL — AB (ref 70–99)
Glucose-Capillary: 232 mg/dL — ABNORMAL HIGH (ref 70–99)

## 2014-07-05 NOTE — Progress Notes (Signed)
PATIENT DETAILS Name: [name] [name] Age: 80 y.o. Sex: male Date of Birth: [birth year] Admit Date: 06/28/2014 Admitting Physician Bynum Bellows, MD [id number], [name], DO  Subjective: No complaints. Pt feels well and near baseline  Assessment/Plan: Choledocholithiasis with abdominal pain  - Pain resolved.  - Was associated with fever and elevated LFTs which have been treading down.   - MRCP done 7/22 and showed multiple stones in the CBD and duct dilitation  - ERCP was attempted by Dr. Paulita Fujita but unfortunately a tight esophageal stricture prevented even the 10 mm diameter endoscope from passing thru his esophagus.  Dr. Radene Journey (ENT) has performed previous esophageal dilations on [name] [name].  - Esophageal dilation with ERCP planned this week. Will follow  Gram Neg Bacteremia  -Blood culture with 2/2 e. coli. Transitioned to rocephin per sensitivities  -Source felt to be related to choledocholithiasis. Anticipate 14 days of antibiotics.  Plan on ceftin on discharge to complete course of abx  Acute on Chronic kidney disease stage III .  -baseline creatinine appears to be 2.1 - 2.2  -Near baseline currently  Hypertension  Patient has been slightly bradycardic and BP has been elevated.   Decreased coreg to 3.125 bid and increased hydralazine to 50 mg TID.   No ACE/ARB due to CKD.  Type 2 diabetes  Diet controlled. Not on any diabetic medications at home. Diabetic diet. CBGs have remained stable with SSI.   Coronary artery disease and status post aortic valve replacement  Continue home meds  Chronic systolic congestive heart failure  Compensated. Continue to monitor. Patient is not on ACE or ARB due to chronic kidney disease stage III. Marland Kitchen   Anemia  Due to chronic kidney disease. Hemoglobin stable.   Hyperlipidemia  Stable.   Disposition  Keep inpatient.   DVT Prophylaxis  Subcutaneous heparin.   CODE STATUS  DO NOT RESUSCITATE/DO NOT INTUBATE. This was  discussed with the patient and family at the time of admission.   Family Communication  Daughter and wife at bedside   Procedures:  None  CONSULTS:  [facility]   Time spent 78 minutes-which includes 50% of the time with face-to-face with patient/ family and coordinating care related to the above assessment and plan.  MEDICATIONS: Scheduled Meds: . aspirin EC  325 mg Oral Daily  . carvedilol  3.125 mg Oral BID WC  . cefTRIAXone (ROCEPHIN)  IV  2 g Intravenous Q24H  . cholecalciferol  1,000 Units Oral Q supper  . furosemide  40 mg Oral Daily  . heparin  5,000 Units Subcutaneous 3 times per day  . hydrALAZINE  50 mg Oral TID  . insulin aspart  0-5 Units Subcutaneous QHS  . insulin aspart  0-9 Units Subcutaneous TID WC  . isosorbide mononitrate  60 mg Oral Daily  . latanoprost  1 drop Both Eyes QHS  . multivitamin with minerals  1 tablet Oral Daily  . multivitamin-prenatal  1 tablet Oral Q supper  . pantoprazole  40 mg Oral Daily  . tamsulosin  0.4 mg Oral QPC supper   Continuous Infusions:   PRN Meds:.acetaminophen, acetaminophen, hydrALAZINE, ondansetron (ZOFRAN) IV, ondansetron  Antibiotics: Anti-infectives   Start     Dose/Rate Route Frequency Ordered Stop   07/03/14 1245  ciprofloxacin (CIPRO) IVPB 400 mg     400 mg 200 mL/hr over 60 Minutes Intravenous  Once 07/03/14 1243 07/03/14 1347   07/01/14 1100  cefTRIAXone (ROCEPHIN) 2 g in dextrose 5 %  50 mL IVPB     2 g 100 mL/hr over 30 Minutes Intravenous Every 24 hours 07/01/14 1002     06/30/14 1600  piperacillin-tazobactam (ZOSYN) IVPB 2.25 g  Status:  Discontinued     2.25 g 100 mL/hr over 30 Minutes Intravenous Every 6 hours 06/30/14 1042 07/01/14 1002   06/30/14 0000  vancomycin (VANCOCIN) IVPB 750 mg/150 ml premix  Status:  Discontinued     750 mg 150 mL/hr over 60 Minutes Intravenous Every 24 hours 06/29/14 0043 06/29/14 1047   06/29/14 0800  piperacillin-tazobactam (ZOSYN) IVPB 3.375 g  Status:  Discontinued       3.375 g 12.5 mL/hr over 240 Minutes Intravenous Every 8 hours 06/29/14 0043 06/30/14 1042   06/28/14 2330  cefTRIAXone (ROCEPHIN) 1 g in dextrose 5 % 50 mL IVPB  Status:  Discontinued     1 g 100 mL/hr over 30 Minutes Intravenous  Once 06/28/14 2315 06/28/14 2317   06/28/14 2330  azithromycin (ZITHROMAX) 500 mg in dextrose 5 % 250 mL IVPB  Status:  Discontinued     500 mg 250 mL/hr over 60 Minutes Intravenous  Once 06/28/14 2315 06/28/14 2317   06/28/14 2330  piperacillin-tazobactam (ZOSYN) IVPB 3.375 g     3.375 g 12.5 mL/hr over 240 Minutes Intravenous  Once 06/28/14 2317 06/29/14 0744   06/28/14 2330  vancomycin (VANCOCIN) IVPB 1000 mg/200 mL premix     1,000 mg 200 mL/hr over 60 Minutes Intravenous  Once 06/28/14 2317 06/29/14 0000       PHYSICAL EXAM: Vital signs in last 24 hours: Filed Vitals:   07/04/14 0946 07/04/14 1359 07/04/14 2052 07/05/14 0441  BP: 154/50 131/55 162/55 141/55  Pulse: 56 54 60 60  Temp:  97.5 F (36.4 C) 98.6 F (37 C) 98.4 F (36.9 C)  TempSrc:  Oral Oral Oral  Resp:  16 17 16   Height:      Weight:    65.998 kg (145 lb 8 oz)  SpO2:  100% 98% 95%    Weight change: 0.098 kg (3.5 oz) Filed Weights   07/03/14 0529 07/04/14 0455 07/05/14 0441  Weight: 65 kg (143 lb 4.8 oz) 65.9 kg (145 lb 4.5 oz) 65.998 kg (145 lb 8 oz)   Body mass index is 22.13 kg/(m^2).   Gen Exam: Awake and alert with clear speech. Smiling, pleasant, Wife and daughter at bedside.  Neck: Supple, No JVD.  Chest: B/L Clear. No accessory muscle use.  CVS: S1 S2 Regular, no murmurs.  Abdomen: soft, BS +, non tender, non distended. No organomegaly  Extremities: no edema, lower extremities warm to touch.  Neurologic: Non Focal.   Intake/Output from previous day: No intake or output data in the 24 hours ending 07/05/14 1426   LAB RESULTS: CBC  Recent Labs Lab 06/28/14 2058 06/29/14 0533 06/30/14 0520 07/03/14 1140 07/04/14 0440  WBC 7.1 11.2* 7.2 5.2 5.6  HGB  12.1* 10.2* 10.1* 10.8* 10.9*  HCT 37.5* 31.7* 30.5* 32.3* 33.8*  PLT 140* 120* 102* 113* 125*  MCV 101.9* 99.7 98.4 98.5 97.7  MCH 32.9 32.1 32.6 32.9 31.5  MCHC 32.3 32.2 33.1 33.4 32.2  RDW 13.9 14.1 14.5 14.2 14.5  LYMPHSABS 1.0  --  1.2  --   --   MONOABS 0.2  --  0.4  --   --   EOSABS 0.1  --  0.2  --   --   BASOSABS 0.0  --  0.0  --   --  Chemistries   Recent Labs Lab 06/29/14 0533 06/30/14 0520 07/02/14 0835 07/03/14 1140 07/04/14 0440  NA 143 138 141 139 139  K 4.2 3.9 4.6 4.7 3.9  CL 99 96 100 100 98  CO2 31 29 27 28 26   GLUCOSE 132* 109* 123* 131* 119*  BUN 41* 46* 38* 34* 29*  CREATININE 2.25* 2.82* 2.65* 2.37* 2.28*  CALCIUM 8.9 8.8 9.1 9.0 8.9    CBG:  Recent Labs Lab 07/04/14 1139 07/04/14 1728 07/04/14 2051 07/05/14 0751 07/05/14 1146  GLUCAP 168* 104* 187* 140* 161*    GFR Estimated Creatinine Clearance: 21.7 ml/min (by C-G formula based on Cr of 2.28).   MICROBIOLOGY: Recent Results (from the past 240 hour(s))  URINE CULTURE     Status: None   Collection Time    06/28/14 10:00 PM      Result Value Ref Range Status   Specimen Description URINE, CLEAN CATCH   Final   Special Requests NONE   Final   Culture  Setup Time     Final   Value: 06/28/2014 23:21     Performed at [place]     Final   Value: NO GROWTH     Performed at [facility]   Culture     Final   Value: NO GROWTH     Performed at [facility]   Report Status 06/30/2014 FINAL   Final  CULTURE, BLOOD (ROUTINE X 2)     Status: None   Collection Time    06/28/14 10:40 PM      Result Value Ref Range Status   Specimen Description BLOOD LEFT ARM   Final   Special Requests BOTTLES DRAWN AEROBIC AND ANAEROBIC [facility] EACH   Final   Culture  Setup Time     Final   Value: 06/29/2014 08:44     Performed at [facility]   Culture     Final   Value: ESCHERICHIA COLI     Note: Gram Stain Report Called to,Read Back By and  Verified With: LINDA BUN ON 06/29/2014 AT 7:58P BY WILEJ     Performed at [facility]   Report Status 07/01/2014 FINAL   Final   Organism ID, Bacteria ESCHERICHIA COLI   Final  CULTURE, BLOOD (ROUTINE X 2)     Status: None   Collection Time    06/28/14 10:45 PM      Result Value Ref Range Status   Specimen Description BLOOD LEFT HAND   Final   Special Requests BOTTLES DRAWN AEROBIC ONLY 8CC   Final   Culture  Setup Time     Final   Value: 06/29/2014 08:43     Performed at [facility]   Culture     Final   Value: ESCHERICHIA COLI     Note: SUSCEPTIBILITIES PERFORMED ON PREVIOUS CULTURE WITHIN THE LAST 5 DAYS.     Note: Gram Stain Report Called to,Read Back By and Verified With: LINDA BUN ON 06/29/2014 AT 7:58P BY Dennard Nip     Performed at [facility]   Report Status 07/01/2014 FINAL   Final    RADIOLOGY STUDIES/RESULTS: Ct Abdomen Pelvis Wo Contrast  06/28/2014   CLINICAL DATA:  Generalized abdominal pain and cramping.  EXAM: CT ABDOMEN AND PELVIS WITHOUT CONTRAST  TECHNIQUE: Multidetector CT imaging of the abdomen and pelvis was performed following the standard protocol without IV contrast.  COMPARISON:  None.  FINDINGS: Mild  nodular densities at the left lung base are nonspecific but could reflect atelectasis. Mild associated scarring is seen. Diffuse coronary artery calcifications are seen. Calcification is also noted at the aortic valve. The patient is status post median sternotomy.  There is partial atrophy of the right hepatic lobe. The liver and spleen are otherwise grossly unremarkable in appearance. The patient is status post cholecystectomy. The pancreas is atrophic; the adrenal glands are unremarkable in appearance.  Mild bilateral renal atrophy is noted, worse on the left. Mild nonspecific perinephric stranding is seen bilaterally. A small 1.0 cm exophytic cyst is noted arising at the lower pole of the right kidney. The kidneys are otherwise unremarkable. There  is no evidence of hydronephrosis. No renal or ureteral stones are seen.  No free fluid is identified. There is a moderate to large right inguinal hernia, containing a relatively long segment of ileum, without evidence for obstruction or strangulation at this time. The small bowel is otherwise unremarkable in appearance. A small left inguinal hernia is seen, containing only fat. The stomach is within normal limits. No acute vascular abnormalities are seen. Scattered calcification is seen along the abdominal aorta and its branches.  Postoperative change is noted at the right lower quadrant. The appendix is not definitely seen. Scattered diverticulosis is noted along the descending and proximal sigmoid colon, without evidence of diverticulitis.  The bladder is mildly distended and grossly unremarkable in appearance. The prostate is enlarged, measuring 5.0 cm in transverse dimension, with minimal calcification. No inguinal lymphadenopathy is seen.  No acute osseous abnormalities are identified. Degenerative change is noted at L5, with a prominent Schmorl's node and associated sclerotic change.  IMPRESSION: 1. No acute abnormality seen to explain the patient's symptoms. 2. Moderate to large right inguinal hernia, containing a relatively long segment of ileum, without evidence for obstruction or strangulation at this time. 3. Small left inguinal hernia, containing only fat. 4. Mild bilateral renal atrophy, worse on the left. Small right renal cyst seen. 5. Partial atrophy of the right hepatic lobe. 6. Scattered nodular densities at the left lung base are nonspecific, but could reflect atelectasis. Mild associated scarring seen. 7. Diffuse coronary artery calcifications noted. Calcification noted at the mitral valve. 8. Scattered diverticulosis along the descending and proximal sigmoid colon, without evidence of diverticulitis. 9. Enlarged prostate noted. 10. Scattered calcification along the abdominal aorta and branches.    Electronically Signed   By: Garald Balding M.D.   On: 06/28/2014 22:15   Dg Chest 2 View  06/28/2014   CLINICAL DATA:  Diffuse abdominal pain.  EXAM: CHEST  2 VIEW  COMPARISON:  Chest radiograph performed 04/11/2013  FINDINGS: The lungs are well-aerated. Mild left basilar airspace opacity may reflect atelectasis or possibly pneumonia. Mild vascular congestion is noted. There is no evidence of pleural effusion or pneumothorax.  The heart is mildly enlarged. The patient is status post median sternotomy. An aortic valve replacement is noted. No acute osseous abnormalities are seen.  IMPRESSION: 1. Mild left basilar airspace opacity may reflect atelectasis or possibly mild pneumonia. 2. Mild vascular congestion and mild cardiomegaly.   Electronically Signed   By: Garald Balding M.D.   On: 06/28/2014 23:08   Mr Abdomen Mrcp Wo Cm  07/01/2014   CLINICAL DATA:  Fever and elevated LFTs.  EXAM: MRI ABDOMEN WITHOUT  (INCLUDING MRCP)  TECHNIQUE: Multiplanar multisequence MR imaging of the abdomen was performed. Heavily T2-weighted images of the biliary and pancreatic ducts were obtained, and three-dimensional MRCP images were  rendered by post processing.  COMPARISON:  07/01/2014  FINDINGS: Small right pleural effusion is identified.  Atrophy of the right hepatic lobe identified. The patient is status post coli cystectomy. No focal liver abnormality identified. The common bile duct is increased in caliber measuring up to 8 mm. Within the proximal common bile duct there is a stone measuring 8 mm. In the distal common bile duct there are 3 stones measuring up to 7 mm. There is mild intrahepatic bile duct dilatation. There is volume loss from the liver parenchyma. There are multiple T2 hyperintense and T1 hypo intense structures within the head and tail of pancreas. The largest appears bilobed and is in the tail of pancreas measuring 1.4 cm. The spleen appears normal.  The adrenal glands are both within normal limits. 1 cm  cyst is noted within the upper pole of the right kidney. Asymmetric cortical atrophy of the left kidney noted. There is a 5.2 cm cyst arising from the upper pole the left kidney. Normal appearance of the abdominal aorta. No retroperitoneal adenopathy identified.  A small amount of perihepatic ascites is identified.  IMPRESSION: 1. Increase caliber of the common bile duct which contains multiple stones. 2. Prior cholecystectomy with atrophy of the right hepatic lobe. 3. Atrophy of the pancreatic parenchyma which contains multiple cystic structures. If there is a history of pancreatitis these findings may reflect multiple pseudocysts. Differential considerations include branch duct IPMNs and multiple serous cystadenoma. Followup examination in 1 year with MRI is recommended. Preferably this should be performed with contrast material. 4. Left renal atrophy. 5. Bilateral renal cysts. 6. Small right pleural effusion.   Electronically Signed   By: Kerby Moors M.D.   On: 07/01/2014 08:45   Mr 3d Recon At Scanner  07/01/2014   CLINICAL DATA:  Fever and elevated LFTs.  EXAM: MRI ABDOMEN WITHOUT  (INCLUDING MRCP)  TECHNIQUE: Multiplanar multisequence MR imaging of the abdomen was performed. Heavily T2-weighted images of the biliary and pancreatic ducts were obtained, and three-dimensional MRCP images were rendered by post processing.  COMPARISON:  07/01/2014  FINDINGS: Small right pleural effusion is identified.  Atrophy of the right hepatic lobe identified. The patient is status post coli cystectomy. No focal liver abnormality identified. The common bile duct is increased in caliber measuring up to 8 mm. Within the proximal common bile duct there is a stone measuring 8 mm. In the distal common bile duct there are 3 stones measuring up to 7 mm. There is mild intrahepatic bile duct dilatation. There is volume loss from the liver parenchyma. There are multiple T2 hyperintense and T1 hypo intense structures within the head  and tail of pancreas. The largest appears bilobed and is in the tail of pancreas measuring 1.4 cm. The spleen appears normal.  The adrenal glands are both within normal limits. 1 cm cyst is noted within the upper pole of the right kidney. Asymmetric cortical atrophy of the left kidney noted. There is a 5.2 cm cyst arising from the upper pole the left kidney. Normal appearance of the abdominal aorta. No retroperitoneal adenopathy identified.  A small amount of perihepatic ascites is identified.  IMPRESSION: 1. Increase caliber of the common bile duct which contains multiple stones. 2. Prior cholecystectomy with atrophy of the right hepatic lobe. 3. Atrophy of the pancreatic parenchyma which contains multiple cystic structures. If there is a history of pancreatitis these findings may reflect multiple pseudocysts. Differential considerations include branch duct IPMNs and multiple serous cystadenoma. Followup examination in  1 year with MRI is recommended. Preferably this should be performed with contrast material. 4. Left renal atrophy. 5. Bilateral renal cysts. 6. Small right pleural effusion.   Electronically Signed   By: Kerby Moors M.D.   On: 07/01/2014 08:45    Janifer Adie, PA-S, [facility]  [place], [place] [facility] Pager:[phone]  If 7PM-7AM, please contact night-coverage [email or web address] Password [email or web address] 07/05/2014, 2:26 PM   LOS: 7 days   **Disclaimer: This note may have been dictated with voice recognition software. Similar sounding words can inadvertently be transcribed and this note may contain transcription errors which may not have been corrected upon publication of note.**  Pt seen and examined. Agree with assessment and plan as outlined above. Pt presents with CBD blockage with ERCP planned for today. Cont on rocephin for ecoli bacteremia. Marylu Lund, MD [phone]

## 2014-07-05 NOTE — Clinical Documentation Improvement (Signed)
Possible Clinical Conditions?  Sepsis secondary to  Gallstones Present on Admission Ecoli bacteremia  Present on Admission Other Condition  Cannot clinically Determine   Clinical Indicators:  Per 07/03/14 Consult note:Patient was admitted with sepsis secondary to gallstones in the hepatic duct and ERCP was attempted today but Dr Paulita Fujita was unable to pass the duodenoscope because of a tight esophageal stricture just distal to the UES.   Per 06/29/14 MD progress note: ? gram-negative bacteremia (has history of bacteremia asked her)   Per 06/30/14 MD Progress note: 88yom on Zosyn Day 2 for Ecoli bacteremia. Vancomycin was discontinued 7/20. Ecoli sensitivities are currently pending - will follow-up for narrowing possibilities    Thank You,  [name] [name] ,RN Clinical Documentation Specialist:  [place] [employer]

## 2014-07-05 NOTE — Progress Notes (Signed)
[facility] Progress Note  Subjective: The patient feels well. He denies abdominal pain.  Objective: Vital signs in last 24 hours: Temp:  [97.5 F (36.4 C)-98.6 F (37 C)] 98.4 F (36.9 C) (07/26 0441) Pulse Rate:  [54-60] 60 (07/26 0441) Resp:  [16-17] 16 (07/26 0441) BP: (131-162)/(55) 141/55 mmHg (07/26 0441) SpO2:  [95 %-100 %] 95 % (07/26 0441) Weight:  [65.998 kg (145 lb 8 oz)] 65.998 kg (145 lb 8 oz) (07/26 0441) Weight change: 0.098 kg (3.5 oz)   PE: He is in no distress  Abdomen is soft and nontender  Lab Results: Results for orders placed during the hospital encounter of 06/28/14 (from the past 24 hour(s))  GLUCOSE, CAPILLARY     Status: Abnormal   Collection Time    07/04/14 11:39 AM      Result Value Ref Range   Glucose-Capillary 168 (*) 70 - 99 mg/dL  GLUCOSE, CAPILLARY     Status: Abnormal   Collection Time    07/04/14  5:28 PM      Result Value Ref Range   Glucose-Capillary 104 (*) 70 - 99 mg/dL  GLUCOSE, CAPILLARY     Status: Abnormal   Collection Time    07/04/14  8:51 PM      Result Value Ref Range   Glucose-Capillary 187 (*) 70 - 99 mg/dL  GLUCOSE, CAPILLARY     Status: Abnormal   Collection Time    07/05/14  7:51 AM      Result Value Ref Range   Glucose-Capillary 140 (*) 70 - 99 mg/dL    Studies/Results: No results found.    Assessment: #1. Common bile duct stones  #2. Proximal esophageal stricture  Plan: Coordinate ERCP and esophageal dilatation. Continue current medical management.    Wonda Horner 07/05/2014, 10:43 AM  Lab Results  Component Value Date   HGB 10.9* 07/04/2014   HGB 10.8* 07/03/2014   HGB 10.1* 06/30/2014   HGB 11.7* 06/03/2014   HGB 10.0* 05/07/2014   HGB 11.1* 04/01/2014   HCT 33.8* 07/04/2014   HCT 32.3* 07/03/2014   HCT 30.5* 06/30/2014   HCT 36.1* 06/03/2014   HCT 30.7* 05/07/2014   HCT 34.3* 04/01/2014   ALKPHOS 302* 07/04/2014   ALKPHOS 313* 07/03/2014   ALKPHOS 307* 07/02/2014   ALKPHOS 73  09/04/2012   AST 38* 07/04/2014   AST 33 07/03/2014   AST 32 07/02/2014   AST 18 09/04/2012   ALT 69* 07/04/2014   ALT 71* 07/03/2014   ALT 90* 07/02/2014   ALT 21 09/04/2012

## 2014-07-06 ENCOUNTER — Encounter (HOSPITAL_COMMUNITY): Payer: Self-pay | Admitting: Gastroenterology

## 2014-07-06 DIAGNOSIS — I959 Hypotension, unspecified: Secondary | ICD-10-CM

## 2014-07-06 DIAGNOSIS — A419 Sepsis, unspecified organism: Secondary | ICD-10-CM

## 2014-07-06 LAB — GLUCOSE, CAPILLARY
Glucose-Capillary: 142 mg/dL — ABNORMAL HIGH (ref 70–99)
Glucose-Capillary: 187 mg/dL — ABNORMAL HIGH (ref 70–99)
Glucose-Capillary: 133 mg/dL — ABNORMAL HIGH (ref 70–99)
Glucose-Capillary: 135 mg/dL — ABNORMAL HIGH (ref 70–99)

## 2014-07-06 MED ORDER — SODIUM CHLORIDE 0.9 % IV SOLN
INTRAVENOUS | Status: DC
  Administered 2014-07-06: 22:00:00 via INTRAVENOUS

## 2014-07-06 NOTE — Progress Notes (Signed)
Patient is scheduled for dilation of the esophageal stenosis tomorrow in the am in the OR.

## 2014-07-06 NOTE — Progress Notes (Signed)
PATIENT DETAILS Name: [name] [name] Age: 80 y.o. Sex: male Date of Birth: Aug 10, 1926 Admit Date: 06/28/2014 Admitting Physician Bynum Bellows, MD FIE:PPIR, [name], DO  Subjective: Doing well.  No complaints.  BM yesterday which he describes as normal. Good appetite and ambulating well.  Assessment/Plan: Choledocholithiasis with abdominal pain  - Pain resolved.  - Was associated with fever and elevated LFTs which have been treading down.  - MRCP done 7/22 and showed multiple stones in the CBD and duct dilitation  - ERCP was attempted by Dr. Paulita Fujita but unfortunately a tight esophageal stricture prevented even the 10 mm diameter endoscope from passing thru his esophagus. Dr. Radene Journey (ENT) has performed previous esophageal dilations on Mr. Sevey.  - Esophageal dilation planned to be done tommorow AM in OR and ERCP tentatively scheduled Wed PM.  Gram Neg Bacteremia  -Blood culture with 2/2 e. coli. Transitioned to rocephin per sensitivities. Started 7/22. -Source felt to be related to choledocholithiasis. Anticipate 14 days of antibiotics. Plan on ceftin on discharge to complete course of abx   Acute on Chronic kidney disease stage III .  -baseline creatinine appears to be 2.1 - 2.2  -Near baseline currently   Hypertension  Patient has been slightly bradycardic and BP has been elevated.  Decreased coreg to 3.125 bid and increased hydralazine to 50 mg TID.  No ACE/ARB due to CKD.   Type 2 diabetes  Diet controlled. Not on any diabetic medications at home. Diabetic diet. CBGs have remained stable with SSI.   Coronary artery disease and status post aortic valve replacement  Continue home meds   Chronic systolic congestive heart failure  Compensated. Continue to monitor. Patient is not on ACE or ARB due to chronic kidney disease stage III. Marland Kitchen   Anemia  Due to chronic kidney disease. Hemoglobin stable.   Hyperlipidemia  Stable.   Disposition  Keep inpatient.    DVT Prophylaxis  Subcutaneous heparin.   CODE STATUS  DO NOT RESUSCITATE/DO NOT INTUBATE. This was discussed with the patient and family at the time of admission.   Family Communication  Daughter at bedside and both her and patient's wife are up to date with the plan for patient   Procedures:  None  CONSULTS:  [facility]  Otolaryngology ENT  Time spent 40 minutes-which includes 50% of the time with face-to-face with patient/ family and coordinating care related to the above assessment and plan.    MEDICATIONS: Scheduled Meds: . aspirin EC  325 mg Oral Daily  . carvedilol  3.125 mg Oral BID WC  . cefTRIAXone (ROCEPHIN)  IV  2 g Intravenous Q24H  . cholecalciferol  1,000 Units Oral Q supper  . furosemide  40 mg Oral Daily  . heparin  5,000 Units Subcutaneous 3 times per day  . hydrALAZINE  50 mg Oral TID  . insulin aspart  0-5 Units Subcutaneous QHS  . insulin aspart  0-9 Units Subcutaneous TID WC  . isosorbide mononitrate  60 mg Oral Daily  . latanoprost  1 drop Both Eyes QHS  . multivitamin with minerals  1 tablet Oral Daily  . multivitamin-prenatal  1 tablet Oral Q supper  . pantoprazole  40 mg Oral Daily  . tamsulosin  0.4 mg Oral QPC supper   Continuous Infusions:  PRN Meds:.acetaminophen, acetaminophen, hydrALAZINE, ondansetron (ZOFRAN) IV, ondansetron  Antibiotics: Anti-infectives   Start     Dose/Rate Route Frequency Ordered Stop   07/03/14 1245  ciprofloxacin (CIPRO)  IVPB 400 mg     400 mg 200 mL/hr over 60 Minutes Intravenous  Once 07/03/14 1243 07/03/14 1347   07/01/14 1100  cefTRIAXone (ROCEPHIN) 2 g in dextrose 5 % 50 mL IVPB     2 g 100 mL/hr over 30 Minutes Intravenous Every 24 hours 07/01/14 1002     06/30/14 1600  piperacillin-tazobactam (ZOSYN) IVPB 2.25 g  Status:  Discontinued     2.25 g 100 mL/hr over 30 Minutes Intravenous Every 6 hours 06/30/14 1042 07/01/14 1002   06/30/14 0000  vancomycin (VANCOCIN) IVPB 750 mg/150 ml premix  Status:   Discontinued     750 mg 150 mL/hr over 60 Minutes Intravenous Every 24 hours 06/29/14 0043 06/29/14 1047   06/29/14 0800  piperacillin-tazobactam (ZOSYN) IVPB 3.375 g  Status:  Discontinued     3.375 g 12.5 mL/hr over 240 Minutes Intravenous Every 8 hours 06/29/14 0043 06/30/14 1042   06/28/14 2330  cefTRIAXone (ROCEPHIN) 1 g in dextrose 5 % 50 mL IVPB  Status:  Discontinued     1 g 100 mL/hr over 30 Minutes Intravenous  Once 06/28/14 2315 06/28/14 2317   06/28/14 2330  azithromycin (ZITHROMAX) 500 mg in dextrose 5 % 250 mL IVPB  Status:  Discontinued     500 mg 250 mL/hr over 60 Minutes Intravenous  Once 06/28/14 2315 06/28/14 2317   06/28/14 2330  piperacillin-tazobactam (ZOSYN) IVPB 3.375 g     3.375 g 12.5 mL/hr over 240 Minutes Intravenous  Once 06/28/14 2317 06/29/14 0744   06/28/14 2330  vancomycin (VANCOCIN) IVPB 1000 mg/200 mL premix     1,000 mg 200 mL/hr over 60 Minutes Intravenous  Once 06/28/14 2317 06/29/14 0000       PHYSICAL EXAM: Vital signs in last 24 hours: Filed Vitals:   07/05/14 2224 07/06/14 0520 07/06/14 0837 07/06/14 1024  BP: 128/54 158/61  168/56  Pulse:  55 58   Temp:  98.3 F (36.8 C)    TempSrc:  Oral    Resp:  16    Height:      Weight:  64.5 kg (142 lb 3.2 oz)    SpO2:  98%      Weight change: -1.498 kg (-3 lb 4.9 oz) Filed Weights   07/04/14 0455 07/05/14 0441 07/06/14 0520  Weight: 65.9 kg (145 lb 4.5 oz) 65.998 kg (145 lb 8 oz) 64.5 kg (142 lb 3.2 oz)   Body mass index is 21.63 kg/(m^2).   Gen Exam: Awake and alert with clear speech. Sitting up and eating breakfast. In good spirits. Neck: Supple, No JVD.  Chest: B/L Clear. No accessory muscle use.  CVS: S1 S2 Regular, no murmurs, gallops, rubs noted  Abdomen: soft, BS +, non tender, non distended. No organomegaly  Extremities: no edema, lower extremities warm to touch.  Neurologic: Non Focal.    Intake/Output from previous day: No intake or output data in the 24 hours ending  07/06/14 1219   LAB RESULTS: CBC  Recent Labs Lab 06/30/14 0520 07/03/14 1140 07/04/14 0440  WBC 7.2 5.2 5.6  HGB 10.1* 10.8* 10.9*  HCT 30.5* 32.3* 33.8*  PLT 102* 113* 125*  MCV 98.4 98.5 97.7  MCH 32.6 32.9 31.5  MCHC 33.1 33.4 32.2  RDW 14.5 14.2 14.5  LYMPHSABS 1.2  --   --   MONOABS 0.4  --   --   EOSABS 0.2  --   --   BASOSABS 0.0  --   --  Chemistries   Recent Labs Lab 06/30/14 0520 07/02/14 0835 07/03/14 1140 07/04/14 0440  NA 138 141 139 139  K 3.9 4.6 4.7 3.9  CL 96 100 100 98  CO2 29 27 28 26   GLUCOSE 109* 123* 131* 119*  BUN 46* 38* 34* 29*  CREATININE 2.82* 2.65* 2.37* 2.28*  CALCIUM 8.8 9.1 9.0 8.9    CBG:  Recent Labs Lab 07/05/14 0751 07/05/14 1146 07/05/14 1705 07/05/14 2209 07/06/14 0759  GLUCAP 140* 161* 121* 232* 142*    GFR Estimated Creatinine Clearance: 21.2 ml/min (by C-G formula based on Cr of 2.28).  Coagulation profile  Recent Labs Lab 07/03/14 1140  INR 1.00    MICROBIOLOGY: Recent Results (from the past 240 hour(s))  URINE CULTURE     Status: None   Collection Time    06/28/14 10:00 PM      Result Value Ref Range Status   Specimen Description URINE, CLEAN CATCH   Final   Special Requests NONE   Final   Culture  Setup Time     Final   Value: 06/28/2014 23:21     Performed at [facility] Count     Final   Value: NO GROWTH     Performed at [facility]   Culture     Final   Value: NO GROWTH     Performed at [facility]   Report Status 06/30/2014 FINAL   Final  CULTURE, BLOOD (ROUTINE X 2)     Status: None   Collection Time    06/28/14 10:40 PM      Result Value Ref Range Status   Specimen Description BLOOD LEFT ARM   Final   Special Requests BOTTLES DRAWN AEROBIC AND ANAEROBIC [facility] EACH   Final   Culture  Setup Time     Final   Value: 06/29/2014 08:44     Performed at [facility]   Culture     Final   Value: ESCHERICHIA COLI     Note: Gram Stain  Report Called to,Read Back By and Verified With: LINDA BUN ON 06/29/2014 AT 7:58P BY WILEJ     Performed at [facility]   Report Status 07/01/2014 FINAL   Final   Organism ID, Bacteria ESCHERICHIA COLI   Final  CULTURE, BLOOD (ROUTINE X 2)     Status: None   Collection Time    06/28/14 10:45 PM      Result Value Ref Range Status   Specimen Description BLOOD LEFT HAND   Final   Special Requests BOTTLES DRAWN AEROBIC ONLY 8CC   Final   Culture  Setup Time     Final   Value: 06/29/2014 08:43     Performed at [facility]   Culture     Final   Value: ESCHERICHIA COLI     Note: SUSCEPTIBILITIES PERFORMED ON PREVIOUS CULTURE WITHIN THE LAST 5 DAYS.     Note: Gram Stain Report Called to,Read Back By and Verified With: LINDA BUN ON 06/29/2014 AT 7:58P BY Dennard Nip     Performed at [facility]   Report Status 07/01/2014 FINAL   Final    RADIOLOGY STUDIES/RESULTS: Ct Abdomen Pelvis Wo Contrast  06/28/2014   CLINICAL DATA:  Generalized abdominal pain and cramping.  EXAM: CT ABDOMEN AND PELVIS WITHOUT CONTRAST  TECHNIQUE: Multidetector CT imaging of the abdomen and pelvis was performed following the standard protocol without IV contrast.  COMPARISON:  None.  FINDINGS: Mild nodular densities at the left lung base are nonspecific but could reflect atelectasis. Mild associated scarring is seen. Diffuse coronary artery calcifications are seen. Calcification is also noted at the aortic valve. The patient is status post median sternotomy.  There is partial atrophy of the right hepatic lobe. The liver and spleen are otherwise grossly unremarkable in appearance. The patient is status post cholecystectomy. The pancreas is atrophic; the adrenal glands are unremarkable in appearance.  Mild bilateral renal atrophy is noted, worse on the left. Mild nonspecific perinephric stranding is seen bilaterally. A small 1.0 cm exophytic cyst is noted arising at the lower pole of the right kidney. The kidneys  are otherwise unremarkable. There is no evidence of hydronephrosis. No renal or ureteral stones are seen.  No free fluid is identified. There is a moderate to large right inguinal hernia, containing a relatively long segment of ileum, without evidence for obstruction or strangulation at this time. The small bowel is otherwise unremarkable in appearance. A small left inguinal hernia is seen, containing only fat. The stomach is within normal limits. No acute vascular abnormalities are seen. Scattered calcification is seen along the abdominal aorta and its branches.  Postoperative change is noted at the right lower quadrant. The appendix is not definitely seen. Scattered diverticulosis is noted along the descending and proximal sigmoid colon, without evidence of diverticulitis.  The bladder is mildly distended and grossly unremarkable in appearance. The prostate is enlarged, measuring 5.0 cm in transverse dimension, with minimal calcification. No inguinal lymphadenopathy is seen.  No acute osseous abnormalities are identified. Degenerative change is noted at L5, with a prominent Schmorl's node and associated sclerotic change.  IMPRESSION: 1. No acute abnormality seen to explain the patient's symptoms. 2. Moderate to large right inguinal hernia, containing a relatively long segment of ileum, without evidence for obstruction or strangulation at this time. 3. Small left inguinal hernia, containing only fat. 4. Mild bilateral renal atrophy, worse on the left. Small right renal cyst seen. 5. Partial atrophy of the right hepatic lobe. 6. Scattered nodular densities at the left lung base are nonspecific, but could reflect atelectasis. Mild associated scarring seen. 7. Diffuse coronary artery calcifications noted. Calcification noted at the mitral valve. 8. Scattered diverticulosis along the descending and proximal sigmoid colon, without evidence of diverticulitis. 9. Enlarged prostate noted. 10. Scattered calcification along  the abdominal aorta and branches.   Electronically Signed   By: Garald Balding M.D.   On: 06/28/2014 22:15   Dg Chest 2 View  06/28/2014   CLINICAL DATA:  Diffuse abdominal pain.  EXAM: CHEST  2 VIEW  COMPARISON:  Chest radiograph performed 04/11/2013  FINDINGS: The lungs are well-aerated. Mild left basilar airspace opacity may reflect atelectasis or possibly pneumonia. Mild vascular congestion is noted. There is no evidence of pleural effusion or pneumothorax.  The heart is mildly enlarged. The patient is status post median sternotomy. An aortic valve replacement is noted. No acute osseous abnormalities are seen.  IMPRESSION: 1. Mild left basilar airspace opacity may reflect atelectasis or possibly mild pneumonia. 2. Mild vascular congestion and mild cardiomegaly.   Electronically Signed   By: Garald Balding M.D.   On: 06/28/2014 23:08   Mr Abdomen Mrcp Wo Cm  07/01/2014   CLINICAL DATA:  Fever and elevated LFTs.  EXAM: MRI ABDOMEN WITHOUT  (INCLUDING MRCP)  TECHNIQUE: Multiplanar multisequence MR imaging of the abdomen was performed. Heavily T2-weighted images of the biliary and pancreatic ducts were obtained, and three-dimensional MRCP  images were rendered by post processing.  COMPARISON:  07/01/2014  FINDINGS: Small right pleural effusion is identified.  Atrophy of the right hepatic lobe identified. The patient is status post coli cystectomy. No focal liver abnormality identified. The common bile duct is increased in caliber measuring up to 8 mm. Within the proximal common bile duct there is a stone measuring 8 mm. In the distal common bile duct there are 3 stones measuring up to 7 mm. There is mild intrahepatic bile duct dilatation. There is volume loss from the liver parenchyma. There are multiple T2 hyperintense and T1 hypo intense structures within the head and tail of pancreas. The largest appears bilobed and is in the tail of pancreas measuring 1.4 cm. The spleen appears normal.  The adrenal glands  are both within normal limits. 1 cm cyst is noted within the upper pole of the right kidney. Asymmetric cortical atrophy of the left kidney noted. There is a 5.2 cm cyst arising from the upper pole the left kidney. Normal appearance of the abdominal aorta. No retroperitoneal adenopathy identified.  A small amount of perihepatic ascites is identified.  IMPRESSION: 1. Increase caliber of the common bile duct which contains multiple stones. 2. Prior cholecystectomy with atrophy of the right hepatic lobe. 3. Atrophy of the pancreatic parenchyma which contains multiple cystic structures. If there is a history of pancreatitis these findings may reflect multiple pseudocysts. Differential considerations include branch duct IPMNs and multiple serous cystadenoma. Followup examination in 1 year with MRI is recommended. Preferably this should be performed with contrast material. 4. Left renal atrophy. 5. Bilateral renal cysts. 6. Small right pleural effusion.   Electronically Signed   By: Kerby Moors M.D.   On: 07/01/2014 08:45   Mr 3d Recon At Scanner  07/01/2014   CLINICAL DATA:  Fever and elevated LFTs.  EXAM: MRI ABDOMEN WITHOUT  (INCLUDING MRCP)  TECHNIQUE: Multiplanar multisequence MR imaging of the abdomen was performed. Heavily T2-weighted images of the biliary and pancreatic ducts were obtained, and three-dimensional MRCP images were rendered by post processing.  COMPARISON:  07/01/2014  FINDINGS: Small right pleural effusion is identified.  Atrophy of the right hepatic lobe identified. The patient is status post coli cystectomy. No focal liver abnormality identified. The common bile duct is increased in caliber measuring up to 8 mm. Within the proximal common bile duct there is a stone measuring 8 mm. In the distal common bile duct there are 3 stones measuring up to 7 mm. There is mild intrahepatic bile duct dilatation. There is volume loss from the liver parenchyma. There are multiple T2 hyperintense and T1  hypo intense structures within the head and tail of pancreas. The largest appears bilobed and is in the tail of pancreas measuring 1.4 cm. The spleen appears normal.  The adrenal glands are both within normal limits. 1 cm cyst is noted within the upper pole of the right kidney. Asymmetric cortical atrophy of the left kidney noted. There is a 5.2 cm cyst arising from the upper pole the left kidney. Normal appearance of the abdominal aorta. No retroperitoneal adenopathy identified.  A small amount of perihepatic ascites is identified.  IMPRESSION: 1. Increase caliber of the common bile duct which contains multiple stones. 2. Prior cholecystectomy with atrophy of the right hepatic lobe. 3. Atrophy of the pancreatic parenchyma which contains multiple cystic structures. If there is a history of pancreatitis these findings may reflect multiple pseudocysts. Differential considerations include branch duct IPMNs and multiple serous cystadenoma. Followup  examination in 1 year with MRI is recommended. Preferably this should be performed with contrast material. 4. Left renal atrophy. 5. Bilateral renal cysts. 6. Small right pleural effusion.   Electronically Signed   By: Kerby Moors M.D.   On: 07/01/2014 08:45    Janifer Adie, PA-S, [facility]  [place], [place] [facility] Pager:[phone]  If 7PM-7AM, please contact night-coverage [email or web address] Password [email or web address] 07/06/2014, 12:19 PM   LOS: 8 days   **Disclaimer: This note may have been dictated with voice recognition software. Similar sounding words can inadvertently be transcribed and this note may contain transcription errors which may not have been corrected upon publication of note.**

## 2014-07-06 NOTE — Progress Notes (Signed)
Discussed coordinating esophageal dilatation/ERCP with Dr Lucia Gaskins. He will try and arrange dilatation either Tues morning or Wed PM with ERCP tentatively scheduled for Wed PM

## 2014-07-06 NOTE — Progress Notes (Signed)
Pt seen and examined. Agree with Ms. York, PA-C. Briefly, pt presents with cheledocholithiasis with ecoli bacteremia. Pt is planned for esophageal stricture dilation tomorrow with EGD planned for later. Will follow. Cont abx for now. Pt feeling well and near baseline. No complaints.

## 2014-07-07 ENCOUNTER — Encounter (HOSPITAL_COMMUNITY): Payer: Medicare Other | Admitting: Anesthesiology

## 2014-07-07 ENCOUNTER — Encounter (HOSPITAL_COMMUNITY)
Admission: EM | Disposition: A | Discharge status: Home / Self Care | Payer: Self-pay | Source: Home / Self Care | Attending: Internal Medicine

## 2014-07-07 ENCOUNTER — Inpatient Hospital Stay (HOSPITAL_COMMUNITY): Payer: Medicare Other | Admitting: Anesthesiology

## 2014-07-07 ENCOUNTER — Encounter (HOSPITAL_COMMUNITY): Payer: Self-pay | Admitting: Anesthesiology

## 2014-07-07 DIAGNOSIS — K222 Esophageal obstruction: Secondary | ICD-10-CM | POA: Diagnosis not present

## 2014-07-07 HISTORY — PX: ESOPHAGOSCOPY WITH DILITATION: SHX5618

## 2014-07-07 LAB — GLUCOSE, CAPILLARY
GLUCOSE-CAPILLARY: 142 mg/dL — AB (ref 70–99)
Glucose-Capillary: 136 mg/dL — ABNORMAL HIGH (ref 70–99)
Glucose-Capillary: 130 mg/dL — ABNORMAL HIGH (ref 70–99)
Glucose-Capillary: 221 mg/dL — ABNORMAL HIGH (ref 70–99)
Glucose-Capillary: 130 mg/dL — ABNORMAL HIGH (ref 70–99)

## 2014-07-07 SURGERY — ESOPHAGOSCOPY, WITH DILATION
Anesthesia: General

## 2014-07-07 MED ORDER — LIDOCAINE HCL (CARDIAC) 20 MG/ML IV SOLN
INTRAVENOUS | Status: AC
  Filled 2014-07-07: qty 5

## 2014-07-07 MED ORDER — PROPOFOL 10 MG/ML IV BOLUS
INTRAVENOUS | Status: AC
  Filled 2014-07-07: qty 20

## 2014-07-07 MED ORDER — ONDANSETRON HCL 4 MG/2ML IJ SOLN: 4.0000 mg | Freq: Once | INTRAMUSCULAR | Status: DC | PRN

## 2014-07-07 MED ORDER — HYDROMORPHONE HCL PF 1 MG/ML IJ SOLN
0.2500 mg | INTRAMUSCULAR | Status: DC | PRN
Start: 2014-07-07 — End: 2014-07-07

## 2014-07-07 MED ORDER — ONDANSETRON HCL 4 MG/2ML IJ SOLN
INTRAMUSCULAR | Status: DC | PRN
  Administered 2014-07-07: 4 mg via INTRAVENOUS

## 2014-07-07 MED ORDER — FENTANYL CITRATE 0.05 MG/ML IJ SOLN
INTRAMUSCULAR | Status: AC
  Filled 2014-07-07: qty 5

## 2014-07-07 MED ORDER — SUCCINYLCHOLINE CHLORIDE 20 MG/ML IJ SOLN
INTRAMUSCULAR | Status: AC
  Filled 2014-07-07: qty 1

## 2014-07-07 MED ORDER — EPHEDRINE SULFATE 50 MG/ML IJ SOLN
INTRAMUSCULAR | Status: AC
  Filled 2014-07-07: qty 1

## 2014-07-07 MED ORDER — EPHEDRINE SULFATE 50 MG/ML IJ SOLN
INTRAMUSCULAR | Status: DC | PRN
  Administered 2014-07-07: 10 mg via INTRAVENOUS

## 2014-07-07 MED ORDER — FENTANYL CITRATE 0.05 MG/ML IJ SOLN
INTRAMUSCULAR | Status: DC | PRN
  Administered 2014-07-07: 75 ug via INTRAVENOUS

## 2014-07-07 MED ORDER — ROCURONIUM BROMIDE 50 MG/5ML IV SOLN
INTRAVENOUS | Status: AC
  Filled 2014-07-07: qty 1

## 2014-07-07 MED ORDER — EPINEPHRINE HCL (NASAL) 0.1 % NA SOLN
NASAL | Status: DC | PRN
  Administered 2014-07-07: 20 mL via TOPICAL

## 2014-07-07 MED ORDER — ONDANSETRON HCL 4 MG/2ML IJ SOLN
INTRAMUSCULAR | Status: AC
  Filled 2014-07-07: qty 2

## 2014-07-07 MED ORDER — PHENYLEPHRINE 40 MCG/ML (10ML) SYRINGE FOR IV PUSH (FOR BLOOD PRESSURE SUPPORT)
PREFILLED_SYRINGE | INTRAVENOUS | Status: AC
  Filled 2014-07-07: qty 10

## 2014-07-07 MED ORDER — PROPOFOL 10 MG/ML IV BOLUS
INTRAVENOUS | Status: DC | PRN
  Administered 2014-07-07: 110 mg via INTRAVENOUS

## 2014-07-07 MED ORDER — SODIUM CHLORIDE 0.9 % IJ SOLN
INTRAMUSCULAR | Status: AC
  Filled 2014-07-07: qty 10

## 2014-07-07 MED ORDER — SUCCINYLCHOLINE CHLORIDE 20 MG/ML IJ SOLN
INTRAMUSCULAR | Status: DC | PRN
  Administered 2014-07-07: 100 mg via INTRAVENOUS

## 2014-07-07 MED ORDER — LIDOCAINE HCL (CARDIAC) 20 MG/ML IV SOLN
INTRAVENOUS | Status: DC | PRN
  Administered 2014-07-07: 100 mg via INTRAVENOUS
  Administered 2014-07-07: 80 mg via INTRATRACHEAL

## 2014-07-07 MED ORDER — LACTATED RINGERS IV SOLN
INTRAVENOUS | Status: DC | PRN
  Administered 2014-07-07: 07:00:00 via INTRAVENOUS

## 2014-07-07 MED ORDER — SODIUM CHLORIDE 0.9 % IV SOLN
INTRAVENOUS | Status: DC | PRN
  Administered 2014-07-07: 07:00:00 via INTRAVENOUS

## 2014-07-07 MED ORDER — SODIUM CHLORIDE 0.9 % IV SOLN
INTRAVENOUS | Status: DC
  Administered 2014-07-07 (×2): via INTRAVENOUS
  Administered 2014-07-08: 1000 mL via INTRAVENOUS

## 2014-07-07 MED ORDER — EPINEPHRINE HCL (NASAL) 0.1 % NA SOLN
NASAL | Status: AC
  Filled 2014-07-07: qty 30

## 2014-07-07 MED ORDER — EPINEPHRINE HCL 0.1 MG/ML IJ SOSY
PREFILLED_SYRINGE | INTRAMUSCULAR | Status: AC
  Filled 2014-07-07: qty 10

## 2014-07-07 SURGICAL SUPPLY — 21 items
BLADE 10 SAFETY STRL DISP (BLADE) ×3 IMPLANT
CANISTER SUCTION 2500CC (MISCELLANEOUS) ×3 IMPLANT
CONT SPEC 4OZ CLIKSEAL STRL BL (MISCELLANEOUS) ×3 IMPLANT
COVER TABLE BACK 60X90 (DRAPES) ×3 IMPLANT
DRAPE PROXIMA HALF (DRAPES) ×3 IMPLANT
GAUZE SPONGE 4X4 16PLY XRAY LF (GAUZE/BANDAGES/DRESSINGS) ×3 IMPLANT
GLOVE SS BIOGEL STRL SZ 7.5 (GLOVE) ×1 IMPLANT
GLOVE SUPERSENSE BIOGEL SZ 7.5 (GLOVE) ×2
GUARD TEETH (MISCELLANEOUS) IMPLANT
KIT ROOM TURNOVER OR (KITS) ×3 IMPLANT
NS IRRIG 1000ML POUR BTL (IV SOLUTION) ×3 IMPLANT
PAD ARMBOARD 7.5X6 YLW CONV (MISCELLANEOUS) ×6 IMPLANT
PATTIES SURGICAL .5 X3 (DISPOSABLE) IMPLANT
SPECIMEN JAR SMALL (MISCELLANEOUS) ×3 IMPLANT
SPONGE INTESTINAL PEANUT (DISPOSABLE) IMPLANT
SURGILUBE 2OZ TUBE FLIPTOP (MISCELLANEOUS) ×3 IMPLANT
TOWEL OR 17X24 6PK STRL BLUE (TOWEL DISPOSABLE) ×6 IMPLANT
TRAP SPECIMEN MUCOUS 40CC (MISCELLANEOUS) IMPLANT
TUBE CONNECTING 12'X1/4 (SUCTIONS) ×1
TUBE CONNECTING 12X1/4 (SUCTIONS) ×2 IMPLANT
WATER STERILE IRR 1000ML POUR (IV SOLUTION) ×3 IMPLANT

## 2014-07-07 NOTE — Transfer of Care (Signed)
Immediate Anesthesia Transfer of Care Note  Patient: [name] [name]  Procedure(s) Performed: Procedure(s): ESOPHAGOSCOPY WITH DILITATION/SAVARY DILATOR (N/A)  Patient Location: PACU  Anesthesia Type:General  Level of Consciousness: awake, alert , oriented and patient cooperative  Airway & Oxygen Therapy: Patient Spontanous Breathing and Patient connected to nasal cannula oxygen  Post-op Assessment: Report given to PACU RN, Post -op Vital signs reviewed and stable and Patient moving all extremities  Post vital signs: Reviewed and stable  Complications: No apparent anesthesia complications

## 2014-07-07 NOTE — Progress Notes (Addendum)
PATIENT DETAILS Name: [name] [name] Age: 80 y.o. Sex: male Date of Birth: [birth year] Admit Date: 06/28/2014 Admitting Physician Bynum Bellows, MD WSF:KCLE, [name], DO  Subjective: Just returned from esophageal dilatation procedure. States he is doing "wonderful, didn't feel a thing".  No complaints and on clear liquids right now.  Assessment/Plan: Choledocholithiasis with abdominal pain  - Pain resolved.  - Was associated with fever and elevated LFTs which have been treading down.  - MRCP done 7/22 and showed multiple stones in the CBD and duct dilitation  - 7/24 ERCP was attempted by Dr. Paulita Fujita but unfortunately a tight esophageal stricture prevented even the 10 mm diameter endoscope from passing thru his esophagus. Dr. Radene Journey (ENT) has performed previous esophageal dilations on [name].  - 7/28 Esophageal dilation by Dr. Lucia Gaskins - ECRP with sphincterotomy and stone extraction scheduled for 7/29 at 2:00 pm.  Gram Neg Bacteremia  -Blood culture with 2/2 e. coli. Transitioned to rocephin per sensitivities. Started 7/22.  -Source felt to be related to choledocholithiasis. Anticipate 14 days of antibiotics. Plan on ceftin on discharge to complete course of abx   Acute on Chronic kidney disease stage III .  -baseline creatinine appears to be 2.1 - 2.2  -Near baseline currently   Hypertension  Patient has been slightly bradycardic and BP has been elevated.  Decreased coreg to 3.125 bid and increased hydralazine to 50 mg TID.  No ACE/ARB due to CKD.   Type 2 diabetes  Diet controlled. Not on any diabetic medications at home. Diabetic diet. CBGs have remained stable with SSI.   Coronary artery disease and status post aortic valve replacement  Continue home meds   Chronic systolic congestive heart failure  Compensated. Continue to monitor. Patient is not on ACE or ARB due to chronic kidney disease stage III.   Anemia  Due to chronic kidney disease.  Hemoglobin stable.   Hyperlipidemia  Stable.   Disposition  Keep inpatient.   DVT Prophylaxis  Subcutaneous heparin.   CODE STATUS  DO NOT RESUSCITATE/DO NOT INTUBATE. This was discussed with the patient and family at the time of admission.   Family Communication  Wife, 2 daughters, and son in law at bedside, up to date on patient's plan  Procedures:  ERCP (attempted but failed due to esophageal stricture) Esophagoscopy with dilitations/savary dilator 25mm-19mm  CONSULTS:  [facility]  Time spent 40 minutes-which includes 50% of the time with face-to-face with patient/ family and coordinating care related to the above assessment and plan.    MEDICATIONS: Scheduled Meds: . aspirin EC  325 mg Oral Daily  . carvedilol  3.125 mg Oral BID WC  . cefTRIAXone (ROCEPHIN)  IV  2 g Intravenous Q24H  . cholecalciferol  1,000 Units Oral Q supper  . furosemide  40 mg Oral Daily  . heparin  5,000 Units Subcutaneous 3 times per day  . hydrALAZINE  50 mg Oral TID  . insulin aspart  0-5 Units Subcutaneous QHS  . insulin aspart  0-9 Units Subcutaneous TID WC  . isosorbide mononitrate  60 mg Oral Daily  . latanoprost  1 drop Both Eyes QHS  . multivitamin with minerals  1 tablet Oral Daily  . multivitamin-prenatal  1 tablet Oral Q supper  . pantoprazole  40 mg Oral Daily  . tamsulosin  0.4 mg Oral QPC supper   Continuous Infusions: . [START ON 07/08/2014] sodium chloride 20 mL/hr at 07/07/14 1132   PRN  Meds:.acetaminophen, acetaminophen, hydrALAZINE, ondansetron (ZOFRAN) IV, ondansetron  Antibiotics: Anti-infectives   Start     Dose/Rate Route Frequency Ordered Stop   07/03/14 1245  ciprofloxacin (CIPRO) IVPB 400 mg     400 mg 200 mL/hr over 60 Minutes Intravenous  Once 07/03/14 1243 07/03/14 1347   07/01/14 1100  cefTRIAXone (ROCEPHIN) 2 g in dextrose 5 % 50 mL IVPB     2 g 100 mL/hr over 30 Minutes Intravenous Every 24 hours 07/01/14 1002     06/30/14 1600   piperacillin-tazobactam (ZOSYN) IVPB 2.25 g  Status:  Discontinued     2.25 g 100 mL/hr over 30 Minutes Intravenous Every 6 hours 06/30/14 1042 07/01/14 1002   06/30/14 0000  vancomycin (VANCOCIN) IVPB 750 mg/150 ml premix  Status:  Discontinued     750 mg 150 mL/hr over 60 Minutes Intravenous Every 24 hours 06/29/14 0043 06/29/14 1047   06/29/14 0800  piperacillin-tazobactam (ZOSYN) IVPB 3.375 g  Status:  Discontinued     3.375 g 12.5 mL/hr over 240 Minutes Intravenous Every 8 hours 06/29/14 0043 06/30/14 1042   06/28/14 2330  cefTRIAXone (ROCEPHIN) 1 g in dextrose 5 % 50 mL IVPB  Status:  Discontinued     1 g 100 mL/hr over 30 Minutes Intravenous  Once 06/28/14 2315 06/28/14 2317   06/28/14 2330  azithromycin (ZITHROMAX) 500 mg in dextrose 5 % 250 mL IVPB  Status:  Discontinued     500 mg 250 mL/hr over 60 Minutes Intravenous  Once 06/28/14 2315 06/28/14 2317   06/28/14 2330  piperacillin-tazobactam (ZOSYN) IVPB 3.375 g     3.375 g 12.5 mL/hr over 240 Minutes Intravenous  Once 06/28/14 2317 06/29/14 0744   06/28/14 2330  vancomycin (VANCOCIN) IVPB 1000 mg/200 mL premix     1,000 mg 200 mL/hr over 60 Minutes Intravenous  Once 06/28/14 2317 06/29/14 0000       PHYSICAL EXAM: Vital signs in last 24 hours: Filed Vitals:   07/07/14 0830 07/07/14 0832 07/07/14 0843 07/07/14 0903  BP: 137/43 137/53  137/48  Pulse: 66 65  65  Temp:   97.2 F (36.2 C) 97.6 F (36.4 C)  TempSrc:    Oral  Resp: 18 23  18   Height:      Weight:      SpO2: 97% 97%  97%    Weight change: -0.543 kg (-1 lb 3.2 oz) Filed Weights   07/05/14 0441 07/06/14 0520 07/07/14 0514  Weight: 65.998 kg (145 lb 8 oz) 64.5 kg (142 lb 3.2 oz) 63.957 kg (141 lb)   Body mass index is 21.44 kg/(m^2).   Gen Exam: Awake and alert with clear speech. Family at bedside. Neck: Supple, No JVD.  Chest: B/L Clear. No accessory muscle use.  CVS: S1 S2 Regular, no murmurs, gallops, rubs noted  Abdomen: soft, BS +, non tender,  non distended. No organomegaly  Extremities: no edema, lower extremities warm to touch.  Neurologic: Non Focal.    Intake/Output from previous day:  Intake/Output Summary (Last 24 hours) at 07/07/14 1304 Last data filed at 07/07/14 1200  Gross per 24 hour  Intake 1049.67 ml  Output    300 ml  Net 749.67 ml     LAB RESULTS: CBC  Recent Labs Lab 07/03/14 1140 07/04/14 0440  WBC 5.2 5.6  HGB 10.8* 10.9*  HCT 32.3* 33.8*  PLT 113* 125*  MCV 98.5 97.7  MCH 32.9 31.5  MCHC 33.4 32.2  RDW 14.2 14.5  Chemistries   Recent Labs Lab 07/02/14 0835 07/03/14 1140 07/04/14 0440  NA 141 139 139  K 4.6 4.7 3.9  CL 100 100 98  CO2 27 28 26   GLUCOSE 123* 131* 119*  BUN 38* 34* 29*  CREATININE 2.65* 2.37* 2.28*  CALCIUM 9.1 9.0 8.9    CBG:  Recent Labs Lab 07/06/14 1634 07/06/14 2113 07/07/14 0826 07/07/14 0859 07/07/14 1242  GLUCAP 133* 135* 136* 130* 221*    GFR Estimated Creatinine Clearance: 21.1 ml/min (by C-G formula based on Cr of 2.28).  Coagulation profile  Recent Labs Lab 07/03/14 1140  INR 1.00    MICROBIOLOGY: Recent Results (from the past 240 hour(s))  URINE CULTURE     Status: None   Collection Time    06/28/14 10:00 PM      Result Value Ref Range Status   Specimen Description URINE, CLEAN CATCH   Final   Special Requests NONE   Final   Culture  Setup Time     Final   Value: 06/28/2014 23:21     Performed at [facility] Count     Final   Value: NO GROWTH     Performed at [facility]   Culture     Final   Value: NO GROWTH     Performed at [facility]   Report Status 06/30/2014 FINAL   Final  CULTURE, BLOOD (ROUTINE X 2)     Status: None   Collection Time    06/28/14 10:40 PM      Result Value Ref Range Status   Specimen Description BLOOD LEFT ARM   Final   Special Requests BOTTLES DRAWN AEROBIC AND ANAEROBIC [facility] EACH   Final   Culture  Setup Time     Final   Value: 06/29/2014 08:44      Performed at [facility]   Culture     Final   Value: ESCHERICHIA COLI     Note: Gram Stain Report Called to,Read Back By and Verified With: LINDA BUN ON 06/29/2014 AT 7:58P BY WILEJ     Performed at [facility]   Report Status 07/01/2014 FINAL   Final   Organism ID, Bacteria ESCHERICHIA COLI   Final  CULTURE, BLOOD (ROUTINE X 2)     Status: None   Collection Time    06/28/14 10:45 PM      Result Value Ref Range Status   Specimen Description BLOOD LEFT HAND   Final   Special Requests BOTTLES DRAWN AEROBIC ONLY 8CC   Final   Culture  Setup Time     Final   Value: 06/29/2014 08:43     Performed at [facility]   Culture     Final   Value: ESCHERICHIA COLI     Note: SUSCEPTIBILITIES PERFORMED ON PREVIOUS CULTURE WITHIN THE LAST 5 DAYS.     Note: Gram Stain Report Called to,Read Back By and Verified With: LINDA BUN ON 06/29/2014 AT 7:58P BY Dennard Nip     Performed at [facility]   Report Status 07/01/2014 FINAL   Final    RADIOLOGY STUDIES/RESULTS: Ct Abdomen Pelvis Wo Contrast  06/28/2014   CLINICAL DATA:  Generalized abdominal pain and cramping.  EXAM: CT ABDOMEN AND PELVIS WITHOUT CONTRAST  TECHNIQUE: Multidetector CT imaging of the abdomen and pelvis was performed following the standard protocol without IV contrast.  COMPARISON:  None.  FINDINGS: Mild nodular densities at the left lung base  are nonspecific but could reflect atelectasis. Mild associated scarring is seen. Diffuse coronary artery calcifications are seen. Calcification is also noted at the aortic valve. The patient is status post median sternotomy.  There is partial atrophy of the right hepatic lobe. The liver and spleen are otherwise grossly unremarkable in appearance. The patient is status post cholecystectomy. The pancreas is atrophic; the adrenal glands are unremarkable in appearance.  Mild bilateral renal atrophy is noted, worse on the left. Mild nonspecific perinephric stranding is seen  bilaterally. A small 1.0 cm exophytic cyst is noted arising at the lower pole of the right kidney. The kidneys are otherwise unremarkable. There is no evidence of hydronephrosis. No renal or ureteral stones are seen.  No free fluid is identified. There is a moderate to large right inguinal hernia, containing a relatively long segment of ileum, without evidence for obstruction or strangulation at this time. The small bowel is otherwise unremarkable in appearance. A small left inguinal hernia is seen, containing only fat. The stomach is within normal limits. No acute vascular abnormalities are seen. Scattered calcification is seen along the abdominal aorta and its branches.  Postoperative change is noted at the right lower quadrant. The appendix is not definitely seen. Scattered diverticulosis is noted along the descending and proximal sigmoid colon, without evidence of diverticulitis.  The bladder is mildly distended and grossly unremarkable in appearance. The prostate is enlarged, measuring 5.0 cm in transverse dimension, with minimal calcification. No inguinal lymphadenopathy is seen.  No acute osseous abnormalities are identified. Degenerative change is noted at L5, with a prominent Schmorl's node and associated sclerotic change.  IMPRESSION: 1. No acute abnormality seen to explain the patient's symptoms. 2. Moderate to large right inguinal hernia, containing a relatively long segment of ileum, without evidence for obstruction or strangulation at this time. 3. Small left inguinal hernia, containing only fat. 4. Mild bilateral renal atrophy, worse on the left. Small right renal cyst seen. 5. Partial atrophy of the right hepatic lobe. 6. Scattered nodular densities at the left lung base are nonspecific, but could reflect atelectasis. Mild associated scarring seen. 7. Diffuse coronary artery calcifications noted. Calcification noted at the mitral valve. 8. Scattered diverticulosis along the descending and proximal  sigmoid colon, without evidence of diverticulitis. 9. Enlarged prostate noted. 10. Scattered calcification along the abdominal aorta and branches.   Electronically Signed   By: Garald Balding M.D.   On: 06/28/2014 22:15   Dg Chest 2 View  06/28/2014   CLINICAL DATA:  Diffuse abdominal pain.  EXAM: CHEST  2 VIEW  COMPARISON:  Chest radiograph performed 04/11/2013  FINDINGS: The lungs are well-aerated. Mild left basilar airspace opacity may reflect atelectasis or possibly pneumonia. Mild vascular congestion is noted. There is no evidence of pleural effusion or pneumothorax.  The heart is mildly enlarged. The patient is status post median sternotomy. An aortic valve replacement is noted. No acute osseous abnormalities are seen.  IMPRESSION: 1. Mild left basilar airspace opacity may reflect atelectasis or possibly mild pneumonia. 2. Mild vascular congestion and mild cardiomegaly.   Electronically Signed   By: Garald Balding M.D.   On: 06/28/2014 23:08   Mr Abdomen Mrcp Wo Cm  07/01/2014   CLINICAL DATA:  Fever and elevated LFTs.  EXAM: MRI ABDOMEN WITHOUT  (INCLUDING MRCP)  TECHNIQUE: Multiplanar multisequence MR imaging of the abdomen was performed. Heavily T2-weighted images of the biliary and pancreatic ducts were obtained, and three-dimensional MRCP images were rendered by post processing.  COMPARISON:  07/01/2014  FINDINGS: Small right pleural effusion is identified.  Atrophy of the right hepatic lobe identified. The patient is status post coli cystectomy. No focal liver abnormality identified. The common bile duct is increased in caliber measuring up to 8 mm. Within the proximal common bile duct there is a stone measuring 8 mm. In the distal common bile duct there are 3 stones measuring up to 7 mm. There is mild intrahepatic bile duct dilatation. There is volume loss from the liver parenchyma. There are multiple T2 hyperintense and T1 hypo intense structures within the head and tail of pancreas. The largest  appears bilobed and is in the tail of pancreas measuring 1.4 cm. The spleen appears normal.  The adrenal glands are both within normal limits. 1 cm cyst is noted within the upper pole of the right kidney. Asymmetric cortical atrophy of the left kidney noted. There is a 5.2 cm cyst arising from the upper pole the left kidney. Normal appearance of the abdominal aorta. No retroperitoneal adenopathy identified.  A small amount of perihepatic ascites is identified.  IMPRESSION: 1. Increase caliber of the common bile duct which contains multiple stones. 2. Prior cholecystectomy with atrophy of the right hepatic lobe. 3. Atrophy of the pancreatic parenchyma which contains multiple cystic structures. If there is a history of pancreatitis these findings may reflect multiple pseudocysts. Differential considerations include branch duct IPMNs and multiple serous cystadenoma. Followup examination in 1 year with MRI is recommended. Preferably this should be performed with contrast material. 4. Left renal atrophy. 5. Bilateral renal cysts. 6. Small right pleural effusion.   Electronically Signed   By: Kerby Moors M.D.   On: 07/01/2014 08:45   Mr 3d Recon At Scanner  07/01/2014   CLINICAL DATA:  Fever and elevated LFTs.  EXAM: MRI ABDOMEN WITHOUT  (INCLUDING MRCP)  TECHNIQUE: Multiplanar multisequence MR imaging of the abdomen was performed. Heavily T2-weighted images of the biliary and pancreatic ducts were obtained, and three-dimensional MRCP images were rendered by post processing.  COMPARISON:  07/01/2014  FINDINGS: Small right pleural effusion is identified.  Atrophy of the right hepatic lobe identified. The patient is status post coli cystectomy. No focal liver abnormality identified. The common bile duct is increased in caliber measuring up to 8 mm. Within the proximal common bile duct there is a stone measuring 8 mm. In the distal common bile duct there are 3 stones measuring up to 7 mm. There is mild intrahepatic  bile duct dilatation. There is volume loss from the liver parenchyma. There are multiple T2 hyperintense and T1 hypo intense structures within the head and tail of pancreas. The largest appears bilobed and is in the tail of pancreas measuring 1.4 cm. The spleen appears normal.  The adrenal glands are both within normal limits. 1 cm cyst is noted within the upper pole of the right kidney. Asymmetric cortical atrophy of the left kidney noted. There is a 5.2 cm cyst arising from the upper pole the left kidney. Normal appearance of the abdominal aorta. No retroperitoneal adenopathy identified.  A small amount of perihepatic ascites is identified.  IMPRESSION: 1. Increase caliber of the common bile duct which contains multiple stones. 2. Prior cholecystectomy with atrophy of the right hepatic lobe. 3. Atrophy of the pancreatic parenchyma which contains multiple cystic structures. If there is a history of pancreatitis these findings may reflect multiple pseudocysts. Differential considerations include branch duct IPMNs and multiple serous cystadenoma. Followup examination in 1 year with MRI is recommended. Preferably  this should be performed with contrast material. 4. Left renal atrophy. 5. Bilateral renal cysts. 6. Small right pleural effusion.   Electronically Signed   By: Kerby Moors M.D.   On: 07/01/2014 08:45    Janifer Adie, PA-S, [facility]  [place], [place] [facility] Pager:[phone]  If 7PM-7AM, please contact night-coverage [email or web address] Password [email or web address] 07/07/2014, 1:04 PM   LOS: 9 days   **Disclaimer: This note may have been dictated with voice recognition software. Similar sounding words can inadvertently be transcribed and this note may contain transcription errors which may not have been corrected upon publication of note.**  Pt seen and examined. Agree with assessment as outlined above. Briefly, pt presents with choledocholithiasis with ecoli bacteremia. Pt also noted to  have esophageal stricture now s/p dilitation. Pt is now awaiting ERCP. Stable thus far. Will follow.  Marylu Lund , MD P [phone]

## 2014-07-07 NOTE — Progress Notes (Signed)
Report given to angel rn as caregiver 

## 2014-07-07 NOTE — Progress Notes (Signed)
[facility] Progress Note  Subjective: Patient just arrived back from esophageal dilatation by Dr. Lucia Gaskins. Tolerating a clear liquid diet and already noting improvement in his dysphagia. Otherwise no complaint  Objective: Vital signs in last 24 hours: Temp:  [97.2 F (36.2 C)-99.3 F (37.4 C)] 97.6 F (36.4 C) (07/28 0903) Pulse Rate:  [51-72] 65 (07/28 0903) Resp:  [10-23] 18 (07/28 0903) BP: (123-168)/(43-67) 137/48 mmHg (07/28 0903) SpO2:  [97 %-100 %] 97 % (07/28 0903) Weight:  [63.957 kg (141 lb)] 63.957 kg (141 lb) (07/28 0514) Weight change: -0.543 kg (-1 lb 3.2 oz)   PE: Alert and oriented no scleral icterus  Lab Results: Results for orders placed during the hospital encounter of 06/28/14 (from the past 24 hour(s))  GLUCOSE, CAPILLARY     Status: Abnormal   Collection Time    07/06/14 12:20 PM      Result Value Ref Range   Glucose-Capillary 187 (*) 70 - 99 mg/dL  GLUCOSE, CAPILLARY     Status: Abnormal   Collection Time    07/06/14  4:34 PM      Result Value Ref Range   Glucose-Capillary 133 (*) 70 - 99 mg/dL  GLUCOSE, CAPILLARY     Status: Abnormal   Collection Time    07/06/14  9:13 PM      Result Value Ref Range   Glucose-Capillary 135 (*) 70 - 99 mg/dL  GLUCOSE, CAPILLARY     Status: Abnormal   Collection Time    07/07/14  8:26 AM      Result Value Ref Range   Glucose-Capillary 136 (*) 70 - 99 mg/dL   Comment 1 Notify RN    GLUCOSE, CAPILLARY     Status: Abnormal   Collection Time    07/07/14  8:59 AM      Result Value Ref Range   Glucose-Capillary 130 (*) 70 - 99 mg/dL   Comment 1 Notify RN     Comment 2 Documented in Chart      Studies/Results: No results found.    Assessment: 1. Common bile duct stones 2. Upper esophageal stricture status post dilatation  Plan: 1. Allow clear liquids today 2. ERCP with sphincterotomy and stone extraction scheduled for tomorrow. Risks rationale and alternatives we discussed with patient and  family and they wish to proceed. We'll continue Rocephin.    Meyer Dockery C 07/07/2014, 9:45 AM

## 2014-07-07 NOTE — H&P (View-Only) (Signed)
Patient is scheduled for dilation of the esophageal stenosis tomorrow in the am in the OR.

## 2014-07-07 NOTE — Anesthesia Procedure Notes (Signed)
Procedure Name: Intubation Date/Time: 07/07/2014 7:37 AM Performed by: Julian Reil Pre-anesthesia Checklist: Patient identified, Emergency Drugs available, Suction available and Patient being monitored Patient Re-evaluated:Patient Re-evaluated prior to inductionOxygen Delivery Method: Circle system utilized Preoxygenation: Pre-oxygenation with 100% oxygen Intubation Type: IV induction Ventilation: Mask ventilation without difficulty Laryngoscope Size: Mac and 4 Grade View: Grade I Tube type: Oral Tube size: 7.5 mm Number of attempts: 1 Airway Equipment and Method: Stylet and LTA kit utilized Placement Confirmation: ETT inserted through vocal cords under direct vision,  positive ETCO2 and breath sounds checked- equal and bilateral Secured at: 22 cm Tube secured with: Tape Dental Injury: Teeth and Oropharynx as per pre-operative assessment

## 2014-07-07 NOTE — Anesthesia Postprocedure Evaluation (Signed)
  Anesthesia Post-op Note  Patient: [name] [name]  Procedure(s) Performed: Procedure(s): ESOPHAGOSCOPY WITH DILITATION/SAVARY DILATOR (N/A)  Patient Location: PACU  Anesthesia Type:General  Level of Consciousness: awake, alert , oriented and patient cooperative  Airway and Oxygen Therapy: Patient Spontanous Breathing  Post-op Pain: none  Post-op Assessment: Post-op Vital signs reviewed, Patient's Cardiovascular Status Stable, Respiratory Function Stable, Patent Airway, No signs of Nausea or vomiting and Pain level controlled  Post-op Vital Signs: stable  Last Vitals:  Filed Vitals:   07/07/14 0843  BP:   Pulse:   Temp: 36.2 C  Resp:     Complications: No apparent anesthesia complications

## 2014-07-07 NOTE — Anesthesia Preprocedure Evaluation (Addendum)
Anesthesia Evaluation  Patient identified by MRN, date of birth, ID band Patient awake    Reviewed: Allergy & Precautions, H&P , NPO status , Patient's Chart, lab work & pertinent test results  Airway Mallampati: I TM Distance: >3 FB     Dental  (+) Poor Dentition, Dental Advisory Given   Pulmonary          Cardiovascular hypertension, + CAD, + Past MI, + CABG and +CHF + Valvular Problems/Murmurs AS     Neuro/Psych CVA    GI/Hepatic GERD-  ,  Endo/Other  diabetes, Type 2  Renal/GU Renal InsufficiencyRenal disease     Musculoskeletal   Abdominal   Peds  Hematology  (+) anemia ,   Anesthesia Other Findings   Reproductive/Obstetrics                          Anesthesia Physical Anesthesia Plan  ASA: III  Anesthesia Plan: General   Post-op Pain Management:    Induction: Intravenous  Airway Management Planned: Oral ETT  Additional Equipment:   Intra-op Plan:   Post-operative Plan: Extubation in OR  Informed Consent: I have reviewed the patients History and Physical, chart, labs and discussed the procedure including the risks, benefits and alternatives for the proposed anesthesia with the patient or authorized representative who has indicated his/her understanding and acceptance.   Dental advisory given  Plan Discussed with: CRNA, Anesthesiologist and Surgeon  Anesthesia Plan Comments:        Anesthesia Quick Evaluation

## 2014-07-07 NOTE — Care Management Note (Signed)
    Page 1 of 1   07/09/2014     10:54:46 AM CARE MANAGEMENT NOTE 07/09/2014  Patient:  [name] [name], [name] [name]   Account Number:  [id number]  Date Initiated:  07/07/2014  Documentation initiated by:  Tomi Bamberger  Subjective/Objective Assessment:   dx abd pain, bacteremia, chf, ckd stage 3  admit- lives with spouse.     Action/Plan:   ERCP 7/24- could not be done secondary to esphageal stricture.  7/28 for dilatation today.  and ERCP 7/29.   Anticipated DC Date:  07/09/2014   Anticipated DC Plan:  [place]  CM consult      Choice offered to / List presented to:             Status of service:  Completed, signed off Medicare Important Message given?  YES (If response is "NO", the following Medicare IM given date fields will be blank) Date Medicare IM given:  07/07/2014 Medicare IM given by:  Tomi Bamberger Date Additional Medicare IM given:   Additional Medicare IM given by:    Discharge Disposition:  HOME/SELF CARE  Per UR Regulation:  Reviewed for med. necessity/level of care/duration of stay  If discussed at [place] of Stay Meetings, dates discussed:    Comments:  07/09/14 Fernando Salinas, BSN [phone] patient is for dc today, no needs anticipated.  07/07/14 [place], BSN [phone] patient lives with spouse.  patient had a ERCP attempt on 7/24 which could not be done due to  espohageal stricture, patient is for dilatation today, and then reattempt ERCP tomorrow. will advance diet also.

## 2014-07-07 NOTE — Interval H&P Note (Signed)
History and Physical Interval Note:  07/07/2014 7:29 AM  [name] [name]  has presented today for surgery, with the diagnosis of ESOPHAGEAL STRICTURE  The various methods of treatment have been discussed with the patient and family. After consideration of risks, benefits and other options for treatment, the patient has consented to  Procedure(s): ESOPHAGOSCOPY WITH DILITATION/SAVARY DILATOR (N/A) as a surgical intervention .  The patient's history has been reviewed, patient examined, no change in status, stable for surgery.  I have reviewed the patient's chart and labs.  Questions were answered to the patient's satisfaction.     Troi Florendo

## 2014-07-07 NOTE — Brief Op Note (Signed)
06/28/2014 - 07/07/2014  8:07 AM  PATIENT:  [name] [name]  80 y.o. male  PRE-OPERATIVE DIAGNOSIS:  ESOPHAGEAL STRICTURE  POST-OPERATIVE DIAGNOSIS:  same  PROCEDURE:  Procedure(s): ESOPHAGOSCOPY WITH DILITATION/SAVARY DILATOR (N/A) 19mm to 34mm  SURGEON:  Surgeon(s) and Role:    * Rozetta Nunnery, MD - Primary  PHYSICIAN ASSISTANT:   ASSISTANTS: none   ANESTHESIA:   general  EBL:  Total I/O In: 400 [I.V.:400] Out: -   BLOOD ADMINISTERED:none  DRAINS: none   LOCAL MEDICATIONS USED:  NONE  SPECIMEN:  No Specimen  DISPOSITION OF SPECIMEN:  N/A  COUNTS:  YES  TOURNIQUET:  * No tourniquets in log *  DICTATION: .Other Dictation: Dictation Number [id number]  PLAN OF CARE: Admit to inpatient   PATIENT DISPOSITION:  PACU - hemodynamically stable.   Delay start of Pharmacological VTE agent (>24hrs) due to surgical blood loss or risk of bleeding: not applicable

## 2014-07-08 ENCOUNTER — Inpatient Hospital Stay (HOSPITAL_COMMUNITY): Payer: Medicare Other | Admitting: Anesthesiology

## 2014-07-08 ENCOUNTER — Encounter (HOSPITAL_COMMUNITY)
Admission: EM | Disposition: A | Discharge status: Home / Self Care | Payer: Self-pay | Source: Home / Self Care | Attending: Internal Medicine

## 2014-07-08 ENCOUNTER — Encounter (HOSPITAL_COMMUNITY): Payer: Self-pay | Admitting: Otolaryngology

## 2014-07-08 ENCOUNTER — Inpatient Hospital Stay (HOSPITAL_COMMUNITY): Payer: Medicare Other

## 2014-07-08 ENCOUNTER — Encounter (HOSPITAL_COMMUNITY): Payer: Medicare Other | Admitting: Anesthesiology

## 2014-07-08 DIAGNOSIS — K8050 Calculus of bile duct without cholangitis or cholecystitis without obstruction: Principal | ICD-10-CM

## 2014-07-08 HISTORY — PX: ERCP: SHX5425

## 2014-07-08 LAB — COMPREHENSIVE METABOLIC PANEL
ALT: 183 U/L — ABNORMAL HIGH (ref 0–53)
ANION GAP: 15 (ref 5–15)
AST: 149 U/L — ABNORMAL HIGH (ref 0–37)
Albumin: 2.6 g/dL — ABNORMAL LOW (ref 3.5–5.2)
Alkaline Phosphatase: 634 U/L — ABNORMAL HIGH (ref 39–117)
BUN: 29 mg/dL — AB (ref 6–23)
CO2: 25 mEq/L (ref 19–32)
CREATININE: 2.27 mg/dL — AB (ref 0.50–1.35)
Calcium: 8.9 mg/dL (ref 8.4–10.5)
Chloride: 97 mEq/L (ref 96–112)
GFR calc Af Amer: 28 mL/min — ABNORMAL LOW (ref >90)
GFR calc non Af Amer: 24 mL/min — ABNORMAL LOW (ref >90)
Glucose, Bld: 120 mg/dL — ABNORMAL HIGH (ref 70–99)
POTASSIUM: 3.9 meq/L (ref 3.7–5.3)
Sodium: 137 mEq/L (ref 137–147)
TOTAL PROTEIN: 6.1 g/dL (ref 6.0–8.3)
Total Bilirubin: 2.7 mg/dL — ABNORMAL HIGH (ref 0.3–1.2)

## 2014-07-08 LAB — GLUCOSE, CAPILLARY
GLUCOSE-CAPILLARY: 126 mg/dL — AB (ref 70–99)
GLUCOSE-CAPILLARY: 125 mg/dL — AB (ref 70–99)
GLUCOSE-CAPILLARY: 113 mg/dL — AB (ref 70–99)
GLUCOSE-CAPILLARY: 202 mg/dL — AB (ref 70–99)
Glucose-Capillary: 114 mg/dL — ABNORMAL HIGH (ref 70–99)

## 2014-07-08 SURGERY — ERCP, WITH INTERVENTION IF INDICATED
Anesthesia: Moderate Sedation

## 2014-07-08 SURGERY — ERCP, WITH INTERVENTION IF INDICATED
Anesthesia: General

## 2014-07-08 MED ORDER — PROPOFOL 10 MG/ML IV BOLUS
INTRAVENOUS | Status: DC | PRN
  Administered 2014-07-08: 110 mg via INTRAVENOUS

## 2014-07-08 MED ORDER — SODIUM CHLORIDE 0.9 % IV SOLN
INTRAVENOUS | Status: DC | PRN
  Administered 2014-07-08: 15:00:00

## 2014-07-08 MED ORDER — NEOSTIGMINE METHYLSULFATE 10 MG/10ML IV SOLN
INTRAVENOUS | Status: DC | PRN
  Administered 2014-07-08: 2 mg via INTRAVENOUS

## 2014-07-08 MED ORDER — ROCURONIUM BROMIDE 100 MG/10ML IV SOLN
INTRAVENOUS | Status: DC | PRN
  Administered 2014-07-08: 25 mg via INTRAVENOUS

## 2014-07-08 MED ORDER — LIDOCAINE HCL 4 % MT SOLN
OROMUCOSAL | Status: DC | PRN
  Administered 2014-07-08: 4 mL via TOPICAL

## 2014-07-08 MED ORDER — GLUCAGON HCL RDNA (DIAGNOSTIC) 1 MG IJ SOLR
INTRAMUSCULAR | Status: AC
  Filled 2014-07-08: qty 1

## 2014-07-08 MED ORDER — SODIUM CHLORIDE 0.9 % IV SOLN: INTRAVENOUS | Status: DC

## 2014-07-08 MED ORDER — METOCLOPRAMIDE HCL 5 MG/ML IJ SOLN
10.0000 mg | Freq: Three times a day (TID) | INTRAMUSCULAR | Status: DC | PRN
  Administered 2014-07-08 (×2): 10 mg via INTRAVENOUS
  Filled 2014-07-08 (×3): qty 2

## 2014-07-08 MED ORDER — ONDANSETRON HCL 4 MG/2ML IJ SOLN
INTRAMUSCULAR | Status: DC | PRN
  Administered 2014-07-08: 4 mg via INTRAVENOUS

## 2014-07-08 MED ORDER — GLYCOPYRROLATE 0.2 MG/ML IJ SOLN
INTRAMUSCULAR | Status: DC | PRN
  Administered 2014-07-08: 0.4 mg via INTRAVENOUS

## 2014-07-08 MED ORDER — PHENYLEPHRINE HCL 10 MG/ML IJ SOLN
10.0000 mg | INTRAVENOUS | Status: DC | PRN
  Administered 2014-07-08: 40 ug/min via INTRAVENOUS

## 2014-07-08 NOTE — Transfer of Care (Signed)
Immediate Anesthesia Transfer of Care Note  Patient: [name] [name]   Procedure(s) Performed: Procedure(s): ENDOSCOPIC RETROGRADE CHOLANGIOPANCREATOGRAPHY (ERCP) (N/A)  Patient Location: PACU and Endoscopy Unit  Anesthesia Type:General  Level of Consciousness: awake, alert  and patient cooperative  Airway & Oxygen Therapy: Patient Spontanous Breathing and Patient connected to face mask oxygen  Post-op Assessment: Report given to PACU RN, Post -op Vital signs reviewed and stable and Patient moving all extremities  Post vital signs: Reviewed and stable  Complications: No apparent anesthesia complications

## 2014-07-08 NOTE — Anesthesia Procedure Notes (Signed)
Procedure Name: Intubation Date/Time: 07/08/2014 2:05 PM Performed by: Izora Gala Pre-anesthesia Checklist: Patient identified Patient Re-evaluated:Patient Re-evaluated prior to inductionOxygen Delivery Method: Circle system utilized Preoxygenation: Pre-oxygenation with 100% oxygen Intubation Type: IV induction Ventilation: Mask ventilation without difficulty Laryngoscope Size: Miller and 3 Grade View: Grade I Tube type: Oral Tube size: 7.5 mm Number of attempts: 1 Airway Equipment and Method: Stylet and LTA kit utilized Placement Confirmation: positive ETCO2,  ETT inserted through vocal cords under direct vision and breath sounds checked- equal and bilateral Secured at: 22 cm Tube secured with: Tape Dental Injury: Teeth and Oropharynx as per pre-operative assessment

## 2014-07-08 NOTE — Anesthesia Preprocedure Evaluation (Addendum)
Anesthesia Evaluation  Patient identified by MRN, date of birth, ID band Patient awake    Reviewed: Allergy & Precautions, H&P , NPO status , Patient's Chart, lab work & pertinent test results, reviewed documented beta blocker date and time   Airway Mallampati: II TM Distance: >3 FB Neck ROM: full    Dental   Pulmonary neg pulmonary ROS,  breath sounds clear to auscultation        Cardiovascular hypertension, On Medications and On Home Beta Blockers + CAD, + Past MI and +CHF + Valvular Problems/Murmurs AS Rhythm:regular     Neuro/Psych CVA negative neurological ROS  negative psych ROS   GI/Hepatic Neg liver ROS, GERD-  Medicated,  Endo/Other  negative endocrine ROSdiabetes  Renal/GU Renal InsufficiencyRenal disease  negative genitourinary   Musculoskeletal   Abdominal   Peds  Hematology  (+) anemia ,   Anesthesia Other Findings See surgeon's H&P   Reproductive/Obstetrics negative OB ROS                          Anesthesia Physical Anesthesia Plan  ASA: III  Anesthesia Plan: General   Post-op Pain Management:    Induction: Intravenous  Airway Management Planned: Oral ETT  Additional Equipment:   Intra-op Plan:   Post-operative Plan: Extubation in OR  Informed Consent: I have reviewed the patients History and Physical, chart, labs and discussed the procedure including the risks, benefits and alternatives for the proposed anesthesia with the patient or authorized representative who has indicated his/her understanding and acceptance.   Dental Advisory Given  Plan Discussed with: CRNA and Surgeon  Anesthesia Plan Comments:         Anesthesia Quick Evaluation

## 2014-07-08 NOTE — Progress Notes (Signed)
ERCP done, for a large irregular stones identified and removed after sphincterotomy. Pancreatic duct not entered. Patient tolerated the procedure well. We'll observe overnight for complications and check liver function tests in the morning.

## 2014-07-08 NOTE — Interval H&P Note (Signed)
History and Physical Interval Note:  07/08/2014 1:58 PM  [name] [name]  has presented today for surgery, with the diagnosis of cbd stone  The various methods of treatment have been discussed with the patient and family. After consideration of risks, benefits and other options for treatment, the patient has consented to  Procedure(s): ENDOSCOPIC RETROGRADE CHOLANGIOPANCREATOGRAPHY (ERCP) (N/A) as a surgical intervention .  The patient's history has been reviewed, patient examined, no change in status, stable for surgery.  I have reviewed the patient's chart and labs.  Questions were answered to the patient's satisfaction.     [name]

## 2014-07-08 NOTE — Op Note (Signed)
[facility] [place], [address]   ERCP PROCEDURE REPORT  PATIENT: [name], [name]  MR# :[id number] BIRTHDATE: May 27, 1926  GENDER: Male ENDOSCOPIST: Teena Irani, MD REFERRED BY: PROCEDURE DATE:  07/08/2014 PROCEDURE: ASA CLASS:   3 INDICATIONS:common bile duct stone MEDICATIONS: general anesthesia TOPICAL ANESTHETIC: none  DESCRIPTION OF PROCEDURE:   After the risks benefits and alternatives of the procedure were thoroughly explained, informed consent was obtained.  The TR-[id number] ([id number])  endoscope was introduced through the mouth  and advanced to the papilla of Vater.      .  it had a normal appearance with a generous intraduodenal segment. Selective cannulation of the common bile duct was achieved with the assistance of a guidewire. A cholangiogram was performed which showed 4 large stones and a dilated common bile duct. The pancreatic duct was not entered with the wire or dye. a large sphincterotomy was performed and clear amber bile was seen to exit the ampulla.A 12-15 mm balloon catheter used to remove the 4 stones individually from most distal to the most proximal. 2 additional balloon sweeps were made with no further stones were seen or delivered and an occlusion cholangiogram at the end of the procedure appeared negative for any further filling defects. The scope was then completely withdrawn from the patient and the procedure terminated.no other abnormalities were noted other than the generally dilated common bile duct.     COMPLICATIONS: none immediate  ENDOSCOPIC IMPRESSION: 4 large common bile duct stones removed with balloon after sphincterotomy. RECOMMENDATIONS: observe for complications, advance diet as tolerated, and check liver function tests in the morning.    _______________________________ Lorrin MaisTeena Irani, MD 07/08/2014 2:56 PM   CC:

## 2014-07-08 NOTE — H&P (View-Only) (Signed)
PATIENT DETAILS Name: [name] [name] Age: 80 y.o. Sex: male Date of Birth: September 24, 1926 Admit Date: 06/28/2014 Admitting Physician Bynum Bellows, MD MHD:QQIW, [name], DO  Subjective: Doing well. Complained of hiccups that started around 3AM last night and stopped 7:45AM this morning.  Did not sleep well last night.  No other complaints.  Assessment/Plan: Choledocholithiasis with abdominal pain  - Pain resolved.  - Was associated with fever and elevated LFTs which have been treading down.  - MRCP done 7/22 and showed multiple stones in the CBD and duct dilitation  - 7/24 ERCP was attempted by Dr. Paulita Fujita but unfortunately a tight esophageal stricture prevented even the 10 mm diameter endoscope from passing thru his esophagus. Dr. Radene Journey (ENT) has performed previous esophageal dilations on [name].  - 7/28 Esophageal dilation by Dr. Lucia Gaskins  - ECRP with sphincterotomy and stone extraction scheduled TODAY at 2:00 pm.   Gram Neg Bacteremia  -Blood culture with 2/2 e. coli. Transitioned to rocephin per sensitivities. Started 7/22.  -Source felt to be related to choledocholithiasis. Anticipate 14 days of antibiotics. Plan on ceftin on discharge to complete course of abx   Acute on Chronic kidney disease stage III .  -baseline creatinine appears to be 2.1 - 2.2  -Near baseline currently   Hypertension  Patient has been slightly bradycardic and BP has been elevated.  Decreased coreg to 3.125 bid and increased hydralazine to 50 mg TID.  No ACE/ARB due to CKD.   Type 2 diabetes  Diet controlled. Not on any diabetic medications at home. Diabetic diet. CBGs have remained stable with SSI.   Coronary artery disease and status post aortic valve replacement  Continue home meds   Chronic systolic congestive heart failure  Compensated. Continue to monitor. Patient is not on ACE or ARB due to chronic kidney disease stage III.   Anemia  Due to chronic kidney disease.  Hemoglobin stable.   Hyperlipidemia  Stable.   Disposition  Keep inpatient.   DVT Prophylaxis  Subcutaneous heparin.   CODE STATUS  DO NOT RESUSCITATE/DO NOT INTUBATE. This was discussed with the patient and family at the time of admission.   Family Communication  No family at bedside this morning. Will be in later today.  Procedures:  ERCP (attempted but failed due to esophageal stricture)  Esophagoscopy with dilitations/savary dilator 49mm-19mm  CONSULTS:  [facility]   MEDICATIONS: Scheduled Meds: . aspirin EC  325 mg Oral Daily  . carvedilol  3.125 mg Oral BID WC  . cefTRIAXone (ROCEPHIN)  IV  2 g Intravenous Q24H  . cholecalciferol  1,000 Units Oral Q supper  . furosemide  40 mg Oral Daily  . heparin  5,000 Units Subcutaneous 3 times per day  . hydrALAZINE  50 mg Oral TID  . insulin aspart  0-5 Units Subcutaneous QHS  . insulin aspart  0-9 Units Subcutaneous TID WC  . isosorbide mononitrate  60 mg Oral Daily  . latanoprost  1 drop Both Eyes QHS  . multivitamin with minerals  1 tablet Oral Daily  . multivitamin-prenatal  1 tablet Oral Q supper  . pantoprazole  40 mg Oral Daily  . tamsulosin  0.4 mg Oral QPC supper   Continuous Infusions: . sodium chloride 20 mL/hr at 07/07/14 2350   PRN Meds:.acetaminophen, acetaminophen, hydrALAZINE, ondansetron (ZOFRAN) IV, ondansetron  Antibiotics: Anti-infectives   Start     Dose/Rate Route Frequency Ordered Stop   07/03/14 1245  ciprofloxacin (  CIPRO) IVPB 400 mg     400 mg 200 mL/hr over 60 Minutes Intravenous  Once 07/03/14 1243 07/03/14 1347   07/01/14 1100  cefTRIAXone (ROCEPHIN) 2 g in dextrose 5 % 50 mL IVPB     2 g 100 mL/hr over 30 Minutes Intravenous Every 24 hours 07/01/14 1002     06/30/14 1600  piperacillin-tazobactam (ZOSYN) IVPB 2.25 g  Status:  Discontinued     2.25 g 100 mL/hr over 30 Minutes Intravenous Every 6 hours 06/30/14 1042 07/01/14 1002   06/30/14 0000  vancomycin (VANCOCIN)  IVPB 750 mg/150 ml premix  Status:  Discontinued     750 mg 150 mL/hr over 60 Minutes Intravenous Every 24 hours 06/29/14 0043 06/29/14 1047   06/29/14 0800  piperacillin-tazobactam (ZOSYN) IVPB 3.375 g  Status:  Discontinued     3.375 g 12.5 mL/hr over 240 Minutes Intravenous Every 8 hours 06/29/14 0043 06/30/14 1042   06/28/14 2330  cefTRIAXone (ROCEPHIN) 1 g in dextrose 5 % 50 mL IVPB  Status:  Discontinued     1 g 100 mL/hr over 30 Minutes Intravenous  Once 06/28/14 2315 06/28/14 2317   06/28/14 2330  azithromycin (ZITHROMAX) 500 mg in dextrose 5 % 250 mL IVPB  Status:  Discontinued     500 mg 250 mL/hr over 60 Minutes Intravenous  Once 06/28/14 2315 06/28/14 2317   06/28/14 2330  piperacillin-tazobactam (ZOSYN) IVPB 3.375 g     3.375 g 12.5 mL/hr over 240 Minutes Intravenous  Once 06/28/14 2317 06/29/14 0744   06/28/14 2330  vancomycin (VANCOCIN) IVPB 1000 mg/200 mL premix     1,000 mg 200 mL/hr over 60 Minutes Intravenous  Once 06/28/14 2317 06/29/14 0000       PHYSICAL EXAM: Vital signs in last 24 hours: Filed Vitals:   07/07/14 1616 07/07/14 2051 07/08/14 0415 07/08/14 0700  BP: 138/48 110/56 144/57 146/61  Pulse: 59 68 60 60  Temp:  98.7 F (37.1 C) 98.9 F (37.2 C) 98.7 F (37.1 C)  TempSrc:  Oral Oral Oral  Resp:  18 18 18   Height:      Weight:   65.4 kg (144 lb 2.9 oz)   SpO2:  96% 96% 97%    Weight change: 1.443 kg (3 lb 2.9 oz) Filed Weights   07/06/14 0520 07/07/14 0514 07/08/14 0415  Weight: 64.5 kg (142 lb 3.2 oz) 63.957 kg (141 lb) 65.4 kg (144 lb 2.9 oz)   Body mass index is 21.93 kg/(m^2).   Gen Exam: Awake and alert with clear speech.   Neck: Supple, No JVD.   Chest: B/L Clear.   CVS: S1 S2 Regular, no murmurs.  Abdomen: soft, BS +, non tender, non distended.  Extremities: no edema, lower extremities warm to touch. Neurologic: Non Focal.   Skin: No Rash.   Wounds: N/A.    Intake/Output from previous day:  Intake/Output Summary (Last 24  hours) at 07/08/14 0811 Last data filed at 07/08/14 0716  Gross per 24 hour  Intake 862.33 ml  Output    450 ml  Net 412.33 ml     LAB RESULTS: CBC  Recent Labs Lab 07/03/14 1140 07/04/14 0440  WBC 5.2 5.6  HGB 10.8* 10.9*  HCT 32.3* 33.8*  PLT 113* 125*  MCV 98.5 97.7  MCH 32.9 31.5  MCHC 33.4 32.2  RDW 14.2 14.5    Chemistries   Recent Labs Lab 07/02/14 0835 07/03/14 1140 07/04/14 0440 07/08/14 0546  NA 141 139 139 137  K 4.6 4.7 3.9 3.9  CL 100 100 98 97  CO2 27 28 26 25   GLUCOSE 123* 131* 119* 120*  BUN 38* 34* 29* 29*  CREATININE 2.65* 2.37* 2.28* 2.27*  CALCIUM 9.1 9.0 8.9 8.9    CBG:  Recent Labs Lab 07/07/14 0826 07/07/14 0859 07/07/14 1242 07/07/14 1724 07/07/14 2050  GLUCAP 136* 130* 221* 142* 130*    GFR Estimated Creatinine Clearance: 21.6 ml/min (by C-G formula based on Cr of 2.27).  Coagulation profile  Recent Labs Lab 07/03/14 1140  INR 1.00    Cardiac Enzymes No results found for this basename: CK, CKMB, TROPONINI, MYOGLOBIN,  in the last 168 hours  No components found with this basename: POCBNP,  No results found for this basename: DDIMER,  in the last 72 hours No results found for this basename: HGBA1C,  in the last 72 hours No results found for this basename: CHOL, HDL, LDLCALC, TRIG, CHOLHDL, LDLDIRECT,  in the last 72 hours No results found for this basename: TSH, T4TOTAL, FREET3, T3FREE, THYROIDAB,  in the last 72 hours No results found for this basename: VITAMINB12, FOLATE, FERRITIN, TIBC, IRON, RETICCTPCT,  in the last 72 hours No results found for this basename: LIPASE, AMYLASE,  in the last 72 hours  Urine Studies No results found for this basename: UACOL, UAPR, USPG, UPH, UTP, UGL, UKET, UBIL, UHGB, UNIT, UROB, ULEU, UEPI, UWBC, URBC, UBAC, CAST, CRYS, UCOM, BILUA,  in the last 72 hours  MICROBIOLOGY: Recent Results (from the past 240 hour(s))  URINE CULTURE     Status: None   Collection Time    06/28/14  10:00 PM      Result Value Ref Range Status   Specimen Description URINE, CLEAN CATCH   Final   Special Requests NONE   Final   Culture  Setup Time     Final   Value: 06/28/2014 23:21     Performed at [facility] Count     Final   Value: NO GROWTH     Performed at [facility]   Culture     Final   Value: NO GROWTH     Performed at [facility]   Report Status 06/30/2014 FINAL   Final  CULTURE, BLOOD (ROUTINE X 2)     Status: None   Collection Time    06/28/14 10:40 PM      Result Value Ref Range Status   Specimen Description BLOOD LEFT ARM   Final   Special Requests BOTTLES DRAWN AEROBIC AND ANAEROBIC [place]   Final   Culture  Setup Time     Final   Value: 06/29/2014 08:44     Performed at [facility]   Culture     Final   Value: ESCHERICHIA COLI     Note: Gram Stain Report Called to,Read Back By and Verified With: LINDA BUN ON 06/29/2014 AT 7:58P BY WILEJ     Performed at [facility]   Report Status 07/01/2014 FINAL   Final   Organism ID, Bacteria ESCHERICHIA COLI   Final  CULTURE, BLOOD (ROUTINE X 2)     Status: None   Collection Time    06/28/14 10:45 PM      Result Value Ref Range Status   Specimen Description BLOOD LEFT HAND   Final   Special Requests BOTTLES DRAWN AEROBIC ONLY [place]   Final   Culture  Setup Time     Final  Value: 06/29/2014 08:43     Performed at [facility]   Culture     Final   Value: ESCHERICHIA COLI     Note: SUSCEPTIBILITIES PERFORMED ON PREVIOUS CULTURE WITHIN THE LAST 5 DAYS.     Note: Gram Stain Report Called to,Read Back By and Verified With: LINDA BUN ON 06/29/2014 AT 7:58P BY Dennard Nip     Performed at [facility]   Report Status 07/01/2014 FINAL   Final    RADIOLOGY STUDIES/RESULTS: Ct Abdomen Pelvis Wo Contrast  06/28/2014   CLINICAL DATA:  Generalized abdominal pain and cramping.  EXAM: CT ABDOMEN AND PELVIS WITHOUT CONTRAST  TECHNIQUE: Multidetector CT imaging of the  abdomen and pelvis was performed following the standard protocol without IV contrast.  COMPARISON:  None.  FINDINGS: Mild nodular densities at the left lung base are nonspecific but could reflect atelectasis. Mild associated scarring is seen. Diffuse coronary artery calcifications are seen. Calcification is also noted at the aortic valve. The patient is status post median sternotomy.  There is partial atrophy of the right hepatic lobe. The liver and spleen are otherwise grossly unremarkable in appearance. The patient is status post cholecystectomy. The pancreas is atrophic; the adrenal glands are unremarkable in appearance.  Mild bilateral renal atrophy is noted, worse on the left. Mild nonspecific perinephric stranding is seen bilaterally. A small 1.0 cm exophytic cyst is noted arising at the lower pole of the right kidney. The kidneys are otherwise unremarkable. There is no evidence of hydronephrosis. No renal or ureteral stones are seen.  No free fluid is identified. There is a moderate to large right inguinal hernia, containing a relatively long segment of ileum, without evidence for obstruction or strangulation at this time. The small bowel is otherwise unremarkable in appearance. A small left inguinal hernia is seen, containing only fat. The stomach is within normal limits. No acute vascular abnormalities are seen. Scattered calcification is seen along the abdominal aorta and its branches.  Postoperative change is noted at the right lower quadrant. The appendix is not definitely seen. Scattered diverticulosis is noted along the descending and proximal sigmoid colon, without evidence of diverticulitis.  The bladder is mildly distended and grossly unremarkable in appearance. The prostate is enlarged, measuring 5.0 cm in transverse dimension, with minimal calcification. No inguinal lymphadenopathy is seen.  No acute osseous abnormalities are identified. Degenerative change is noted at L5, with a prominent  Schmorl's node and associated sclerotic change.  IMPRESSION: 1. No acute abnormality seen to explain the patient's symptoms. 2. Moderate to large right inguinal hernia, containing a relatively long segment of ileum, without evidence for obstruction or strangulation at this time. 3. Small left inguinal hernia, containing only fat. 4. Mild bilateral renal atrophy, worse on the left. Small right renal cyst seen. 5. Partial atrophy of the right hepatic lobe. 6. Scattered nodular densities at the left lung base are nonspecific, but could reflect atelectasis. Mild associated scarring seen. 7. Diffuse coronary artery calcifications noted. Calcification noted at the mitral valve. 8. Scattered diverticulosis along the descending and proximal sigmoid colon, without evidence of diverticulitis. 9. Enlarged prostate noted. 10. Scattered calcification along the abdominal aorta and branches.   Electronically Signed   By: Garald Balding M.D.   On: 06/28/2014 22:15   Dg Chest 2 View  06/28/2014   CLINICAL DATA:  Diffuse abdominal pain.  EXAM: CHEST  2 VIEW  COMPARISON:  Chest radiograph performed 04/11/2013  FINDINGS: The lungs are well-aerated. Mild left basilar airspace  opacity may reflect atelectasis or possibly pneumonia. Mild vascular congestion is noted. There is no evidence of pleural effusion or pneumothorax.  The heart is mildly enlarged. The patient is status post median sternotomy. An aortic valve replacement is noted. No acute osseous abnormalities are seen.  IMPRESSION: 1. Mild left basilar airspace opacity may reflect atelectasis or possibly mild pneumonia. 2. Mild vascular congestion and mild cardiomegaly.   Electronically Signed   By: Garald Balding M.D.   On: 06/28/2014 23:08   Mr Abdomen Mrcp Wo Cm  07/01/2014   CLINICAL DATA:  Fever and elevated LFTs.  EXAM: MRI ABDOMEN WITHOUT  (INCLUDING MRCP)  TECHNIQUE: Multiplanar multisequence MR imaging of the abdomen was performed. Heavily T2-weighted images of the  biliary and pancreatic ducts were obtained, and three-dimensional MRCP images were rendered by post processing.  COMPARISON:  07/01/2014  FINDINGS: Small right pleural effusion is identified.  Atrophy of the right hepatic lobe identified. The patient is status post coli cystectomy. No focal liver abnormality identified. The common bile duct is increased in caliber measuring up to 8 mm. Within the proximal common bile duct there is a stone measuring 8 mm. In the distal common bile duct there are 3 stones measuring up to 7 mm. There is mild intrahepatic bile duct dilatation. There is volume loss from the liver parenchyma. There are multiple T2 hyperintense and T1 hypo intense structures within the head and tail of pancreas. The largest appears bilobed and is in the tail of pancreas measuring 1.4 cm. The spleen appears normal.  The adrenal glands are both within normal limits. 1 cm cyst is noted within the upper pole of the right kidney. Asymmetric cortical atrophy of the left kidney noted. There is a 5.2 cm cyst arising from the upper pole the left kidney. Normal appearance of the abdominal aorta. No retroperitoneal adenopathy identified.  A small amount of perihepatic ascites is identified.  IMPRESSION: 1. Increase caliber of the common bile duct which contains multiple stones. 2. Prior cholecystectomy with atrophy of the right hepatic lobe. 3. Atrophy of the pancreatic parenchyma which contains multiple cystic structures. If there is a history of pancreatitis these findings may reflect multiple pseudocysts. Differential considerations include branch duct IPMNs and multiple serous cystadenoma. Followup examination in 1 year with MRI is recommended. Preferably this should be performed with contrast material. 4. Left renal atrophy. 5. Bilateral renal cysts. 6. Small right pleural effusion.   Electronically Signed   By: Kerby Moors M.D.   On: 07/01/2014 08:45   Mr 3d Recon At Scanner  07/01/2014   CLINICAL DATA:   Fever and elevated LFTs.  EXAM: MRI ABDOMEN WITHOUT  (INCLUDING MRCP)  TECHNIQUE: Multiplanar multisequence MR imaging of the abdomen was performed. Heavily T2-weighted images of the biliary and pancreatic ducts were obtained, and three-dimensional MRCP images were rendered by post processing.  COMPARISON:  07/01/2014  FINDINGS: Small right pleural effusion is identified.  Atrophy of the right hepatic lobe identified. The patient is status post coli cystectomy. No focal liver abnormality identified. The common bile duct is increased in caliber measuring up to 8 mm. Within the proximal common bile duct there is a stone measuring 8 mm. In the distal common bile duct there are 3 stones measuring up to 7 mm. There is mild intrahepatic bile duct dilatation. There is volume loss from the liver parenchyma. There are multiple T2 hyperintense and T1 hypo intense structures within the head and tail of pancreas. The largest appears bilobed and  is in the tail of pancreas measuring 1.4 cm. The spleen appears normal.  The adrenal glands are both within normal limits. 1 cm cyst is noted within the upper pole of the right kidney. Asymmetric cortical atrophy of the left kidney noted. There is a 5.2 cm cyst arising from the upper pole the left kidney. Normal appearance of the abdominal aorta. No retroperitoneal adenopathy identified.  A small amount of perihepatic ascites is identified.  IMPRESSION: 1. Increase caliber of the common bile duct which contains multiple stones. 2. Prior cholecystectomy with atrophy of the right hepatic lobe. 3. Atrophy of the pancreatic parenchyma which contains multiple cystic structures. If there is a history of pancreatitis these findings may reflect multiple pseudocysts. Differential considerations include branch duct IPMNs and multiple serous cystadenoma. Followup examination in 1 year with MRI is recommended. Preferably this should be performed with contrast material. 4. Left renal atrophy. 5.  Bilateral renal cysts. 6. Small right pleural effusion.   Electronically Signed   By: Kerby Moors M.D.   On: 07/01/2014 08:45    Janifer Adie, PA-S, [facility]  [facility] Pager:[phone]  If 7PM-7AM, please contact night-coverage [email or web address] Password [email or web address] 07/08/2014, 8:11 AM   LOS: 10 days   **Disclaimer: This note may have been dictated with voice recognition software. Similar sounding words can inadvertently be transcribed and this note may contain transcription errors which may not have been corrected upon publication of note.**  Attending Patient was seen, examined,treatment plan was discussed with the Physician extender. I have directly reviewed the clinical findings, lab, imaging studies and management of this patient in detail. I have made the necessary changes to the above noted documentation, and agree with the documentation, as recorded by the Physician extender.  Nena Alexander MD [facility].

## 2014-07-08 NOTE — Op Note (Signed)
NAMESADAO, WEYER             ACCOUNT NO.:  [id number]  MEDICAL RECORD NO.:  [id number]  LOCATION:                                 FACILITY:  PHYSICIAN:  Leonides Sake. Lucia Gaskins, M.D.DATE OF BIRTH:  05/30/1926  DATE OF PROCEDURE:  07/07/2014 DATE OF DISCHARGE:                              OPERATIVE REPORT   PREOPERATIVE DIAGNOSIS:  Upper esophageal stricture.  POSTOPERATIVE DIAGNOSIS:  Upper esophageal stricture.  OPERATION PERFORMED:  Cervical esophagoscopy with dilation using the Savary dilators 8 mm to 19 mm.  SURGEON:  Leonides Sake. Lucia Gaskins, M.D.  ANESTHESIA:  General endotracheal.  COMPLICATIONS:  None.  BRIEF CLINICAL NOTE:  [name] [name] is an 80 year old gentleman who has had history of upper esophageal stricture for 15 years.  He has had a previous dilations of the stricture 3 times previously by myself, last time 3-1/2 years ago.  He had recently been diagnosed with gallstones with common bile duct stone and was planning on having ERCP, but gastroenterology was unable to pass the endoscope and pass the upper esophageal stricture and was referred back to me for a dilation of the stricture.  He is taken to the operating room at this time for dilation of upper esophageal stricture using the Savary dilators.  DESCRIPTION OF PROCEDURE:  After adequate endotracheal anesthesia using the cervical esophagoscope, this was passed down.  The supraglottic tissues were all normal in appearance and passed down the right piriform sinus down to the upper esophageal stricture which was easily visualized.  The guidewire to the Savary dilators were then passed through the small opening of the stricture.  Subsequent dilations of the stricture was begun with the 8 mm Savary dilator passed over the guidewire.  Dilations were subsequently advanced to 19 mm dilator. Following dilation, the cervical esophagoscope was then placed and passed again through the strictured area without  any difficulty.  The mucosa of the esophagus distal to the stricture was normal in appearance.  This completed the procedure.  [name] was awoken from anesthesia and transferred to recovery room, postop doing well.  DISPOSITION:  He was transferred back to the floor and will resume diet and continue with antacid medication and will subsequently have ERCP performed by Gastroenterology in the next few days.          ______________________________ Leonides Sake Lucia Gaskins, M.D.     CEN/MEDQ  D:  07/07/2014  T:  07/07/2014  Job:  [id number]  cc:   Elyse Jarvis. Amedeo Plenty, M.D.

## 2014-07-08 NOTE — Progress Notes (Signed)
PATIENT DETAILS Name: [name] [name] Age: 80 y.o. Sex: male Date of Birth: 02/24/1926 Admit Date: 06/28/2014 Admitting Physician Bynum Bellows, MD ZDG:UYQI, [name], DO  Subjective: Doing well. Complained of hiccups that started around 3AM last night and stopped 7:45AM this morning.  Did not sleep well last night.  No other complaints.  Assessment/Plan: Choledocholithiasis with abdominal pain  - Pain resolved.  - Was associated with fever and elevated LFTs which have been treading down.  - MRCP done 7/22 and showed multiple stones in the CBD and duct dilitation  - 7/24 ERCP was attempted by Dr. Paulita Fujita but unfortunately a tight esophageal stricture prevented even the 10 mm diameter endoscope from passing thru his esophagus. Dr. Radene Journey (ENT) has performed previous esophageal dilations on [name].  - 7/28 Esophageal dilation by Dr. Lucia Gaskins  - ECRP with sphincterotomy and stone extraction scheduled TODAY at 2:00 pm.   Gram Neg Bacteremia  -Blood culture with 2/2 e. coli. Transitioned to rocephin per sensitivities. Started 7/22.  -Source felt to be related to choledocholithiasis. Anticipate 14 days of antibiotics. Plan on ceftin on discharge to complete course of abx   Acute on Chronic kidney disease stage III .  -baseline creatinine appears to be 2.1 - 2.2  -Near baseline currently   Hypertension  Patient has been slightly bradycardic and BP has been elevated.  Decreased coreg to 3.125 bid and increased hydralazine to 50 mg TID.  No ACE/ARB due to CKD.   Type 2 diabetes  Diet controlled. Not on any diabetic medications at home. Diabetic diet. CBGs have remained stable with SSI.   Coronary artery disease and status post aortic valve replacement  Continue home meds   Chronic systolic congestive heart failure  Compensated. Continue to monitor. Patient is not on ACE or ARB due to chronic kidney disease stage III.   Anemia  Due to chronic kidney disease.  Hemoglobin stable.   Hyperlipidemia  Stable.   Disposition  Keep inpatient.   DVT Prophylaxis  Subcutaneous heparin.   CODE STATUS  DO NOT RESUSCITATE/DO NOT INTUBATE. This was discussed with the patient and family at the time of admission.   Family Communication  No family at bedside this morning. Will be in later today.  Procedures:  ERCP (attempted but failed due to esophageal stricture)  Esophagoscopy with dilitations/savary dilator 58mm-19mm  CONSULTS:  [facility]   MEDICATIONS: Scheduled Meds: . aspirin EC  325 mg Oral Daily  . carvedilol  3.125 mg Oral BID WC  . cefTRIAXone (ROCEPHIN)  IV  2 g Intravenous Q24H  . cholecalciferol  1,000 Units Oral Q supper  . furosemide  40 mg Oral Daily  . heparin  5,000 Units Subcutaneous 3 times per day  . hydrALAZINE  50 mg Oral TID  . insulin aspart  0-5 Units Subcutaneous QHS  . insulin aspart  0-9 Units Subcutaneous TID WC  . isosorbide mononitrate  60 mg Oral Daily  . latanoprost  1 drop Both Eyes QHS  . multivitamin with minerals  1 tablet Oral Daily  . multivitamin-prenatal  1 tablet Oral Q supper  . pantoprazole  40 mg Oral Daily  . tamsulosin  0.4 mg Oral QPC supper   Continuous Infusions: . sodium chloride 20 mL/hr at 07/07/14 2350   PRN Meds:.acetaminophen, acetaminophen, hydrALAZINE, ondansetron (ZOFRAN) IV, ondansetron  Antibiotics: Anti-infectives   Start     Dose/Rate Route Frequency Ordered Stop   07/03/14 1245  ciprofloxacin (  CIPRO) IVPB 400 mg     400 mg 200 mL/hr over 60 Minutes Intravenous  Once 07/03/14 1243 07/03/14 1347   07/01/14 1100  cefTRIAXone (ROCEPHIN) 2 g in dextrose 5 % 50 mL IVPB     2 g 100 mL/hr over 30 Minutes Intravenous Every 24 hours 07/01/14 1002     06/30/14 1600  piperacillin-tazobactam (ZOSYN) IVPB 2.25 g  Status:  Discontinued     2.25 g 100 mL/hr over 30 Minutes Intravenous Every 6 hours 06/30/14 1042 07/01/14 1002   06/30/14 0000  vancomycin (VANCOCIN)  IVPB 750 mg/150 ml premix  Status:  Discontinued     750 mg 150 mL/hr over 60 Minutes Intravenous Every 24 hours 06/29/14 0043 06/29/14 1047   06/29/14 0800  piperacillin-tazobactam (ZOSYN) IVPB 3.375 g  Status:  Discontinued     3.375 g 12.5 mL/hr over 240 Minutes Intravenous Every 8 hours 06/29/14 0043 06/30/14 1042   06/28/14 2330  cefTRIAXone (ROCEPHIN) 1 g in dextrose 5 % 50 mL IVPB  Status:  Discontinued     1 g 100 mL/hr over 30 Minutes Intravenous  Once 06/28/14 2315 06/28/14 2317   06/28/14 2330  azithromycin (ZITHROMAX) 500 mg in dextrose 5 % 250 mL IVPB  Status:  Discontinued     500 mg 250 mL/hr over 60 Minutes Intravenous  Once 06/28/14 2315 06/28/14 2317   06/28/14 2330  piperacillin-tazobactam (ZOSYN) IVPB 3.375 g     3.375 g 12.5 mL/hr over 240 Minutes Intravenous  Once 06/28/14 2317 06/29/14 0744   06/28/14 2330  vancomycin (VANCOCIN) IVPB 1000 mg/200 mL premix     1,000 mg 200 mL/hr over 60 Minutes Intravenous  Once 06/28/14 2317 06/29/14 0000       PHYSICAL EXAM: Vital signs in last 24 hours: Filed Vitals:   07/07/14 1616 07/07/14 2051 07/08/14 0415 07/08/14 0700  BP: 138/48 110/56 144/57 146/61  Pulse: 59 68 60 60  Temp:  98.7 F (37.1 C) 98.9 F (37.2 C) 98.7 F (37.1 C)  TempSrc:  Oral Oral Oral  Resp:  18 18 18   Height:      Weight:   65.4 kg (144 lb 2.9 oz)   SpO2:  96% 96% 97%    Weight change: 1.443 kg (3 lb 2.9 oz) Filed Weights   07/06/14 0520 07/07/14 0514 07/08/14 0415  Weight: 64.5 kg (142 lb 3.2 oz) 63.957 kg (141 lb) 65.4 kg (144 lb 2.9 oz)   Body mass index is 21.93 kg/(m^2).   Gen Exam: Awake and alert with clear speech.   Neck: Supple, No JVD.   Chest: B/L Clear.   CVS: S1 S2 Regular, no murmurs.  Abdomen: soft, BS +, non tender, non distended.  Extremities: no edema, lower extremities warm to touch. Neurologic: Non Focal.   Skin: No Rash.   Wounds: N/A.    Intake/Output from previous day:  Intake/Output Summary (Last 24  hours) at 07/08/14 0811 Last data filed at 07/08/14 0716  Gross per 24 hour  Intake 862.33 ml  Output    450 ml  Net 412.33 ml     LAB RESULTS: CBC  Recent Labs Lab 07/03/14 1140 07/04/14 0440  WBC 5.2 5.6  HGB 10.8* 10.9*  HCT 32.3* 33.8*  PLT 113* 125*  MCV 98.5 97.7  MCH 32.9 31.5  MCHC 33.4 32.2  RDW 14.2 14.5    Chemistries   Recent Labs Lab 07/02/14 0835 07/03/14 1140 07/04/14 0440 07/08/14 0546  NA 141 139 139 137  K 4.6 4.7 3.9 3.9  CL 100 100 98 97  CO2 27 28 26 25   GLUCOSE 123* 131* 119* 120*  BUN 38* 34* 29* 29*  CREATININE 2.65* 2.37* 2.28* 2.27*  CALCIUM 9.1 9.0 8.9 8.9    CBG:  Recent Labs Lab 07/07/14 0826 07/07/14 0859 07/07/14 1242 07/07/14 1724 07/07/14 2050  GLUCAP 136* 130* 221* 142* 130*    GFR Estimated Creatinine Clearance: 21.6 ml/min (by C-G formula based on Cr of 2.27).  Coagulation profile  Recent Labs Lab 07/03/14 1140  INR 1.00    Cardiac Enzymes No results found for this basename: CK, CKMB, TROPONINI, MYOGLOBIN,  in the last 168 hours  No components found with this basename: POCBNP,  No results found for this basename: DDIMER,  in the last 72 hours No results found for this basename: HGBA1C,  in the last 72 hours No results found for this basename: CHOL, HDL, LDLCALC, TRIG, CHOLHDL, LDLDIRECT,  in the last 72 hours No results found for this basename: TSH, T4TOTAL, FREET3, T3FREE, THYROIDAB,  in the last 72 hours No results found for this basename: VITAMINB12, FOLATE, FERRITIN, TIBC, IRON, RETICCTPCT,  in the last 72 hours No results found for this basename: LIPASE, AMYLASE,  in the last 72 hours  Urine Studies No results found for this basename: UACOL, UAPR, USPG, UPH, UTP, UGL, UKET, UBIL, UHGB, UNIT, UROB, ULEU, UEPI, UWBC, URBC, UBAC, CAST, CRYS, UCOM, BILUA,  in the last 72 hours  MICROBIOLOGY: Recent Results (from the past 240 hour(s))  URINE CULTURE     Status: None   Collection Time    06/28/14  10:00 PM      Result Value Ref Range Status   Specimen Description URINE, CLEAN CATCH   Final   Special Requests NONE   Final   Culture  Setup Time     Final   Value: 06/28/2014 23:21     Performed at [facility] Count     Final   Value: NO GROWTH     Performed at [facility]   Culture     Final   Value: NO GROWTH     Performed at [facility]   Report Status 06/30/2014 FINAL   Final  CULTURE, BLOOD (ROUTINE X 2)     Status: None   Collection Time    06/28/14 10:40 PM      Result Value Ref Range Status   Specimen Description BLOOD LEFT ARM   Final   Special Requests BOTTLES DRAWN AEROBIC AND ANAEROBIC [place]   Final   Culture  Setup Time     Final   Value: 06/29/2014 08:44     Performed at [facility]   Culture     Final   Value: ESCHERICHIA COLI     Note: Gram Stain Report Called to,Read Back By and Verified With: LINDA BUN ON 06/29/2014 AT 7:58P BY WILEJ     Performed at [facility]   Report Status 07/01/2014 FINAL   Final   Organism ID, Bacteria ESCHERICHIA COLI   Final  CULTURE, BLOOD (ROUTINE X 2)     Status: None   Collection Time    06/28/14 10:45 PM      Result Value Ref Range Status   Specimen Description BLOOD LEFT HAND   Final   Special Requests BOTTLES DRAWN AEROBIC ONLY [place]   Final   Culture  Setup Time     Final  Value: 06/29/2014 08:43     Performed at [facility]   Culture     Final   Value: ESCHERICHIA COLI     Note: SUSCEPTIBILITIES PERFORMED ON PREVIOUS CULTURE WITHIN THE LAST 5 DAYS.     Note: Gram Stain Report Called to,Read Back By and Verified With: LINDA BUN ON 06/29/2014 AT 7:58P BY Dennard Nip     Performed at [facility]   Report Status 07/01/2014 FINAL   Final    RADIOLOGY STUDIES/RESULTS: Ct Abdomen Pelvis Wo Contrast  06/28/2014   CLINICAL DATA:  Generalized abdominal pain and cramping.  EXAM: CT ABDOMEN AND PELVIS WITHOUT CONTRAST  TECHNIQUE: Multidetector CT imaging of the  abdomen and pelvis was performed following the standard protocol without IV contrast.  COMPARISON:  None.  FINDINGS: Mild nodular densities at the left lung base are nonspecific but could reflect atelectasis. Mild associated scarring is seen. Diffuse coronary artery calcifications are seen. Calcification is also noted at the aortic valve. The patient is status post median sternotomy.  There is partial atrophy of the right hepatic lobe. The liver and spleen are otherwise grossly unremarkable in appearance. The patient is status post cholecystectomy. The pancreas is atrophic; the adrenal glands are unremarkable in appearance.  Mild bilateral renal atrophy is noted, worse on the left. Mild nonspecific perinephric stranding is seen bilaterally. A small 1.0 cm exophytic cyst is noted arising at the lower pole of the right kidney. The kidneys are otherwise unremarkable. There is no evidence of hydronephrosis. No renal or ureteral stones are seen.  No free fluid is identified. There is a moderate to large right inguinal hernia, containing a relatively long segment of ileum, without evidence for obstruction or strangulation at this time. The small bowel is otherwise unremarkable in appearance. A small left inguinal hernia is seen, containing only fat. The stomach is within normal limits. No acute vascular abnormalities are seen. Scattered calcification is seen along the abdominal aorta and its branches.  Postoperative change is noted at the right lower quadrant. The appendix is not definitely seen. Scattered diverticulosis is noted along the descending and proximal sigmoid colon, without evidence of diverticulitis.  The bladder is mildly distended and grossly unremarkable in appearance. The prostate is enlarged, measuring 5.0 cm in transverse dimension, with minimal calcification. No inguinal lymphadenopathy is seen.  No acute osseous abnormalities are identified. Degenerative change is noted at L5, with a prominent  Schmorl's node and associated sclerotic change.  IMPRESSION: 1. No acute abnormality seen to explain the patient's symptoms. 2. Moderate to large right inguinal hernia, containing a relatively long segment of ileum, without evidence for obstruction or strangulation at this time. 3. Small left inguinal hernia, containing only fat. 4. Mild bilateral renal atrophy, worse on the left. Small right renal cyst seen. 5. Partial atrophy of the right hepatic lobe. 6. Scattered nodular densities at the left lung base are nonspecific, but could reflect atelectasis. Mild associated scarring seen. 7. Diffuse coronary artery calcifications noted. Calcification noted at the mitral valve. 8. Scattered diverticulosis along the descending and proximal sigmoid colon, without evidence of diverticulitis. 9. Enlarged prostate noted. 10. Scattered calcification along the abdominal aorta and branches.   Electronically Signed   By: Garald Balding M.D.   On: 06/28/2014 22:15   Dg Chest 2 View  06/28/2014   CLINICAL DATA:  Diffuse abdominal pain.  EXAM: CHEST  2 VIEW  COMPARISON:  Chest radiograph performed 04/11/2013  FINDINGS: The lungs are well-aerated. Mild left basilar airspace  opacity may reflect atelectasis or possibly pneumonia. Mild vascular congestion is noted. There is no evidence of pleural effusion or pneumothorax.  The heart is mildly enlarged. The patient is status post median sternotomy. An aortic valve replacement is noted. No acute osseous abnormalities are seen.  IMPRESSION: 1. Mild left basilar airspace opacity may reflect atelectasis or possibly mild pneumonia. 2. Mild vascular congestion and mild cardiomegaly.   Electronically Signed   By: Garald Balding M.D.   On: 06/28/2014 23:08   Mr Abdomen Mrcp Wo Cm  07/01/2014   CLINICAL DATA:  Fever and elevated LFTs.  EXAM: MRI ABDOMEN WITHOUT  (INCLUDING MRCP)  TECHNIQUE: Multiplanar multisequence MR imaging of the abdomen was performed. Heavily T2-weighted images of the  biliary and pancreatic ducts were obtained, and three-dimensional MRCP images were rendered by post processing.  COMPARISON:  07/01/2014  FINDINGS: Small right pleural effusion is identified.  Atrophy of the right hepatic lobe identified. The patient is status post coli cystectomy. No focal liver abnormality identified. The common bile duct is increased in caliber measuring up to 8 mm. Within the proximal common bile duct there is a stone measuring 8 mm. In the distal common bile duct there are 3 stones measuring up to 7 mm. There is mild intrahepatic bile duct dilatation. There is volume loss from the liver parenchyma. There are multiple T2 hyperintense and T1 hypo intense structures within the head and tail of pancreas. The largest appears bilobed and is in the tail of pancreas measuring 1.4 cm. The spleen appears normal.  The adrenal glands are both within normal limits. 1 cm cyst is noted within the upper pole of the right kidney. Asymmetric cortical atrophy of the left kidney noted. There is a 5.2 cm cyst arising from the upper pole the left kidney. Normal appearance of the abdominal aorta. No retroperitoneal adenopathy identified.  A small amount of perihepatic ascites is identified.  IMPRESSION: 1. Increase caliber of the common bile duct which contains multiple stones. 2. Prior cholecystectomy with atrophy of the right hepatic lobe. 3. Atrophy of the pancreatic parenchyma which contains multiple cystic structures. If there is a history of pancreatitis these findings may reflect multiple pseudocysts. Differential considerations include branch duct IPMNs and multiple serous cystadenoma. Followup examination in 1 year with MRI is recommended. Preferably this should be performed with contrast material. 4. Left renal atrophy. 5. Bilateral renal cysts. 6. Small right pleural effusion.   Electronically Signed   By: Kerby Moors M.D.   On: 07/01/2014 08:45   Mr 3d Recon At Scanner  07/01/2014   CLINICAL DATA:   Fever and elevated LFTs.  EXAM: MRI ABDOMEN WITHOUT  (INCLUDING MRCP)  TECHNIQUE: Multiplanar multisequence MR imaging of the abdomen was performed. Heavily T2-weighted images of the biliary and pancreatic ducts were obtained, and three-dimensional MRCP images were rendered by post processing.  COMPARISON:  07/01/2014  FINDINGS: Small right pleural effusion is identified.  Atrophy of the right hepatic lobe identified. The patient is status post coli cystectomy. No focal liver abnormality identified. The common bile duct is increased in caliber measuring up to 8 mm. Within the proximal common bile duct there is a stone measuring 8 mm. In the distal common bile duct there are 3 stones measuring up to 7 mm. There is mild intrahepatic bile duct dilatation. There is volume loss from the liver parenchyma. There are multiple T2 hyperintense and T1 hypo intense structures within the head and tail of pancreas. The largest appears bilobed and  is in the tail of pancreas measuring 1.4 cm. The spleen appears normal.  The adrenal glands are both within normal limits. 1 cm cyst is noted within the upper pole of the right kidney. Asymmetric cortical atrophy of the left kidney noted. There is a 5.2 cm cyst arising from the upper pole the left kidney. Normal appearance of the abdominal aorta. No retroperitoneal adenopathy identified.  A small amount of perihepatic ascites is identified.  IMPRESSION: 1. Increase caliber of the common bile duct which contains multiple stones. 2. Prior cholecystectomy with atrophy of the right hepatic lobe. 3. Atrophy of the pancreatic parenchyma which contains multiple cystic structures. If there is a history of pancreatitis these findings may reflect multiple pseudocysts. Differential considerations include branch duct IPMNs and multiple serous cystadenoma. Followup examination in 1 year with MRI is recommended. Preferably this should be performed with contrast material. 4. Left renal atrophy. 5.  Bilateral renal cysts. 6. Small right pleural effusion.   Electronically Signed   By: Kerby Moors M.D.   On: 07/01/2014 08:45    Janifer Adie, PA-S, [facility]  [facility] Pager:[phone]  If 7PM-7AM, please contact night-coverage [email or web address] Password [email or web address] 07/08/2014, 8:11 AM   LOS: 10 days   **Disclaimer: This note may have been dictated with voice recognition software. Similar sounding words can inadvertently be transcribed and this note may contain transcription errors which may not have been corrected upon publication of note.**  Attending Patient was seen, examined,treatment plan was discussed with the Physician extender. I have directly reviewed the clinical findings, lab, imaging studies and management of this patient in detail. I have made the necessary changes to the above noted documentation, and agree with the documentation, as recorded by the Physician extender.  Nena [facility] MD [facility].

## 2014-07-09 ENCOUNTER — Encounter (HOSPITAL_COMMUNITY): Payer: Self-pay | Admitting: Gastroenterology

## 2014-07-09 LAB — COMPREHENSIVE METABOLIC PANEL
ALT: 122 U/L — AB (ref 0–53)
AST: 65 U/L — AB (ref 0–37)
Albumin: 2.7 g/dL — ABNORMAL LOW (ref 3.5–5.2)
Alkaline Phosphatase: 590 U/L — ABNORMAL HIGH (ref 39–117)
Anion gap: 12 (ref 5–15)
BUN: 30 mg/dL — ABNORMAL HIGH (ref 6–23)
CALCIUM: 8.9 mg/dL (ref 8.4–10.5)
CO2: 28 mEq/L (ref 19–32)
CREATININE: 2.31 mg/dL — AB (ref 0.50–1.35)
Chloride: 99 mEq/L (ref 96–112)
GFR calc Af Amer: 28 mL/min — ABNORMAL LOW (ref >90)
GFR calc non Af Amer: 24 mL/min — ABNORMAL LOW (ref >90)
Glucose, Bld: 96 mg/dL (ref 70–99)
Potassium: 4.2 mEq/L (ref 3.7–5.3)
SODIUM: 139 meq/L (ref 137–147)
TOTAL PROTEIN: 6.3 g/dL (ref 6.0–8.3)
Total Bilirubin: 1.5 mg/dL — ABNORMAL HIGH (ref 0.3–1.2)

## 2014-07-09 LAB — GLUCOSE, CAPILLARY
Glucose-Capillary: 122 mg/dL — ABNORMAL HIGH (ref 70–99)
Glucose-Capillary: 239 mg/dL — ABNORMAL HIGH (ref 70–99)

## 2014-07-09 MED ORDER — CEPHALEXIN 500 MG PO CAPS: 500.0000 mg | ORAL_CAPSULE | Freq: Two times a day (BID) | ORAL | Status: DC

## 2014-07-09 NOTE — Progress Notes (Signed)
07/09/14 Patient being discharged home with wife. IV site removed and discharge instructions reviewed with patient.

## 2014-07-09 NOTE — Discharge Summary (Signed)
PATIENT DETAILS Name: [name] [name] Age: 80 y.o. Sex: male Date of Birth: [birth year] MRN: [id number]. Admit Date: 06/28/2014 Admitting Physician: Bynum Bellows, MD JHE:RDEY, [facility], DO  Recommendations for Outpatient Follow-up:  1. Kidney function back to baseline. 2. LFT's have trended down but still elevated.  Please repeat LFTs need to be recheck in one week to ensure they have normalized  PRIMARY DISCHARGE DIAGNOSIS:  Principal Problem:   Abdominal pain Active Problems:   Hypertension   CAD   Chronic systolic heart failure   CKD (chronic kidney disease)   HLD (hyperlipidemia)   S/P AVR (aortic valve replacement)   Fever   Elevated liver function tests   DM2 (diabetes mellitus, type 2)      PAST MEDICAL HISTORY: Past Medical History  Diagnosis Date  . History of colon cancer     s/p colon resection  . Aortic stenosis     a. s/p AVR with 55mm Edwards pericardial valve 02/07/12 - post-op course complicated by pleural effusion requring thoracentesis/leg cellulitis 03/2012.  Marland Kitchen CAD (coronary artery disease)     a. s/p NSTEMI 12/2011:  LHC - Ostial left main 20%, ostial LAD 50%, mid 60-70%, ostial D1 40% and mid 40%, D2 70%, ostial circumflex occluded, ostial RCA 80-90%, LVEDP was 42. b.  s/p CABG x 3 at time of AVR (LiMA-LAD, SVG-2nd daigonal, SVG-PDA) 02/07/12 (post-op course noted above).  . Ischemic cardiomyopathy   . Chronic systolic heart failure     a. TEE 01/2012: EF 25-30%, diffuse hypokinesis. b. Not on ACEI due to renal insufficiency.;  c. follow up  echo 08/06/12: EF 25%, mod diast dysfxn, AVR ok, mild MR, mod LAE, mild RAE, mild to mod RV systolic dysfunction  . HLD (hyperlipidemia)   . GERD (gastroesophageal reflux disease)   . Chronic kidney disease   . Pleural effusion     a. post-operatively after AVR/CABG s/p thoracentesis 03/2012 yielding 1L serosanguinous fluid.  . Diabetes mellitus     borderline  . Blood transfusion     NO REACTION TO TRANSFUSION  .  Cellulitis     a. RLE cellulitis 2 months post-operatively after AVR/CABG - serratia marcessans, tx with I&D/antibiotics  . Mitral regurgitation     Moderate by TEE 01/2012  . Flash pulmonary edema     Post-cath 12/2011, went into acute pulm edema requiring IV lasix and intubation  . Baker's cyst 07/23/12    Incidental finding of LE venous dopplers  . Cataract     right eye, hx of  . HTN (hypertension)     primary, Dr. Jose Persia  . Myocardial infarction 12/2011  . CHF (congestive heart failure)   . Stroke 10/01    RIght Leg weakness  . Cancer '90's    Colon  . Anemia     DISCHARGE MEDICATIONS:   Medication List         amoxicillin 500 MG capsule  Commonly known as:  AMOXIL  Take 2,000 mg by mouth See admin instructions. Take 4 capsules (2000 mg) prior to dental appointment     aspirin EC 325 MG tablet  Take 325 mg by mouth daily.     carvedilol 25 MG tablet  Commonly known as:  COREG  Take 1 tablet (25 mg total) by mouth 2 (two) times daily with a meal.     cephALEXin 500 MG capsule  Commonly known as:  KEFLEX  Take 1 capsule (500 mg total) by mouth every 12 (twelve) hours.  cholecalciferol 1000 UNITS tablet  Commonly known as:  VITAMIN D  Take 1,000 Units by mouth daily with supper.     furosemide 40 MG tablet  Commonly known as:  LASIX  Take 40 mg by mouth 2 (two) times daily. At breakfast and at lunch     hydrALAZINE 25 MG tablet  Commonly known as:  APRESOLINE  Take 1 tablet (25 mg total) by mouth 3 (three) times daily.     isosorbide mononitrate 60 MG 24 hr tablet  Commonly known as:  IMDUR  Take 1 tablet (60 mg total) by mouth daily.     multivitamin with minerals Tabs tablet  Take 1 tablet by mouth daily.     multivitamin-prenatal 27-0.8 MG Tabs tablet  Take 1 tablet by mouth daily with supper.     nitroGLYCERIN 0.4 MG SL tablet  Commonly known as:  NITROSTAT  Place 0.4 mg under the tongue every 5 (five) minutes as needed for chest pain.       omeprazole 20 MG capsule  Commonly known as:  PRILOSEC  Take 20 mg by mouth daily.     tamsulosin 0.4 MG Caps capsule  Commonly known as:  FLOMAX  Take 0.4 mg by mouth daily after supper. Take 30 minutes after eating     Travoprost (BAK Free) 0.004 % Soln ophthalmic solution  Commonly known as:  TRAVATAN  Place 1 drop into both eyes at bedtime.        ALLERGIES:   Allergies  Allergen Reactions  . Statins Other (See Comments)    Pain/weakness in legs    BRIEF HPI: See H&P, Labs, Consult and Test reports for all details. In brief, patient was admitted on  07/19 for abdominal pain, fever, and elevated LFTs that were of unclear etiology.  Patient's symptoms started at 7 pm that day after having his dinner which consisted of eating a sandwich and cantaloupe. Subsequently after that, he started having abdominal cramping followed by sharp generalized abdominal pain. The symptoms were quite severe and as a result he presented to the emergency department for further evaluation.  CT of abdomen and pelvis showed no acute abnormality and chest x-ray showed mild left basilar airspace opacity which may reflect atelectasis or mild pneumonia.  Patient's abdominal pain had significantly improved and was much more comfortable when transferred on floor. Patient denied any chest pain, shortness of breath, cough, diarrhea, headaches or vision changes.         CONSULTATIONS:   [facility] - [facility]  PERTINENT RADIOLOGIC STUDIES: Ct Abdomen Pelvis Wo Contrast  06/28/2014   CLINICAL DATA:  Generalized abdominal pain and cramping.  EXAM: CT ABDOMEN AND PELVIS WITHOUT CONTRAST  TECHNIQUE: Multidetector CT imaging of the abdomen and pelvis was performed following the standard protocol without IV contrast.  COMPARISON:  None.  FINDINGS: Mild nodular densities at the left lung base are nonspecific but could reflect atelectasis. Mild associated scarring is seen. Diffuse coronary artery calcifications  are seen. Calcification is also noted at the aortic valve. The patient is status post median sternotomy.  There is partial atrophy of the right hepatic lobe. The liver and spleen are otherwise grossly unremarkable in appearance. The patient is status post cholecystectomy. The pancreas is atrophic; the adrenal glands are unremarkable in appearance.  Mild bilateral renal atrophy is noted, worse on the left. Mild nonspecific perinephric stranding is seen bilaterally. A small 1.0 cm exophytic cyst is noted arising at the lower pole of the right kidney.  The kidneys are otherwise unremarkable. There is no evidence of hydronephrosis. No renal or ureteral stones are seen.  No free fluid is identified. There is a moderate to large right inguinal hernia, containing a relatively long segment of ileum, without evidence for obstruction or strangulation at this time. The small bowel is otherwise unremarkable in appearance. A small left inguinal hernia is seen, containing only fat. The stomach is within normal limits. No acute vascular abnormalities are seen. Scattered calcification is seen along the abdominal aorta and its branches.  Postoperative change is noted at the right lower quadrant. The appendix is not definitely seen. Scattered diverticulosis is noted along the descending and proximal sigmoid colon, without evidence of diverticulitis.  The bladder is mildly distended and grossly unremarkable in appearance. The prostate is enlarged, measuring 5.0 cm in transverse dimension, with minimal calcification. No inguinal lymphadenopathy is seen.  No acute osseous abnormalities are identified. Degenerative change is noted at L5, with a prominent Schmorl's node and associated sclerotic change.  IMPRESSION: 1. No acute abnormality seen to explain the patient's symptoms. 2. Moderate to large right inguinal hernia, containing a relatively long segment of ileum, without evidence for obstruction or strangulation at this time. 3. Small  left inguinal hernia, containing only fat. 4. Mild bilateral renal atrophy, worse on the left. Small right renal cyst seen. 5. Partial atrophy of the right hepatic lobe. 6. Scattered nodular densities at the left lung base are nonspecific, but could reflect atelectasis. Mild associated scarring seen. 7. Diffuse coronary artery calcifications noted. Calcification noted at the mitral valve. 8. Scattered diverticulosis along the descending and proximal sigmoid colon, without evidence of diverticulitis. 9. Enlarged prostate noted. 10. Scattered calcification along the abdominal aorta and branches.   Electronically Signed   By: Garald Balding M.D.   On: 06/28/2014 22:15   Dg Chest 2 View  06/28/2014   CLINICAL DATA:  Diffuse abdominal pain.  EXAM: CHEST  2 VIEW  COMPARISON:  Chest radiograph performed 04/11/2013  FINDINGS: The lungs are well-aerated. Mild left basilar airspace opacity may reflect atelectasis or possibly pneumonia. Mild vascular congestion is noted. There is no evidence of pleural effusion or pneumothorax.  The heart is mildly enlarged. The patient is status post median sternotomy. An aortic valve replacement is noted. No acute osseous abnormalities are seen.  IMPRESSION: 1. Mild left basilar airspace opacity may reflect atelectasis or possibly mild pneumonia. 2. Mild vascular congestion and mild cardiomegaly.   Electronically Signed   By: Garald Balding M.D.   On: 06/28/2014 23:08   Mr Abdomen Mrcp Wo Cm  07/01/2014   CLINICAL DATA:  Fever and elevated LFTs.  EXAM: MRI ABDOMEN WITHOUT  (INCLUDING MRCP)  TECHNIQUE: Multiplanar multisequence MR imaging of the abdomen was performed. Heavily T2-weighted images of the biliary and pancreatic ducts were obtained, and three-dimensional MRCP images were rendered by post processing.  COMPARISON:  07/01/2014  FINDINGS: Small right pleural effusion is identified.  Atrophy of the right hepatic lobe identified. The patient is status post coli cystectomy. No  focal liver abnormality identified. The common bile duct is increased in caliber measuring up to 8 mm. Within the proximal common bile duct there is a stone measuring 8 mm. In the distal common bile duct there are 3 stones measuring up to 7 mm. There is mild intrahepatic bile duct dilatation. There is volume loss from the liver parenchyma. There are multiple T2 hyperintense and T1 hypo intense structures within the head and tail of pancreas. The largest  appears bilobed and is in the tail of pancreas measuring 1.4 cm. The spleen appears normal.  The adrenal glands are both within normal limits. 1 cm cyst is noted within the upper pole of the right kidney. Asymmetric cortical atrophy of the left kidney noted. There is a 5.2 cm cyst arising from the upper pole the left kidney. Normal appearance of the abdominal aorta. No retroperitoneal adenopathy identified.  A small amount of perihepatic ascites is identified.  IMPRESSION: 1. Increase caliber of the common bile duct which contains multiple stones. 2. Prior cholecystectomy with atrophy of the right hepatic lobe. 3. Atrophy of the pancreatic parenchyma which contains multiple cystic structures. If there is a history of pancreatitis these findings may reflect multiple pseudocysts. Differential considerations include branch duct IPMNs and multiple serous cystadenoma. Followup examination in 1 year with MRI is recommended. Preferably this should be performed with contrast material. 4. Left renal atrophy. 5. Bilateral renal cysts. 6. Small right pleural effusion.   Electronically Signed   By: Kerby Moors M.D.   On: 07/01/2014 08:45   Mr 3d Recon At Scanner  07/01/2014   CLINICAL DATA:  Fever and elevated LFTs.  EXAM: MRI ABDOMEN WITHOUT  (INCLUDING MRCP)  TECHNIQUE: Multiplanar multisequence MR imaging of the abdomen was performed. Heavily T2-weighted images of the biliary and pancreatic ducts were obtained, and three-dimensional MRCP images were rendered by post  processing.  COMPARISON:  07/01/2014  FINDINGS: Small right pleural effusion is identified.  Atrophy of the right hepatic lobe identified. The patient is status post coli cystectomy. No focal liver abnormality identified. The common bile duct is increased in caliber measuring up to 8 mm. Within the proximal common bile duct there is a stone measuring 8 mm. In the distal common bile duct there are 3 stones measuring up to 7 mm. There is mild intrahepatic bile duct dilatation. There is volume loss from the liver parenchyma. There are multiple T2 hyperintense and T1 hypo intense structures within the head and tail of pancreas. The largest appears bilobed and is in the tail of pancreas measuring 1.4 cm. The spleen appears normal.  The adrenal glands are both within normal limits. 1 cm cyst is noted within the upper pole of the right kidney. Asymmetric cortical atrophy of the left kidney noted. There is a 5.2 cm cyst arising from the upper pole the left kidney. Normal appearance of the abdominal aorta. No retroperitoneal adenopathy identified.  A small amount of perihepatic ascites is identified.  IMPRESSION: 1. Increase caliber of the common bile duct which contains multiple stones. 2. Prior cholecystectomy with atrophy of the right hepatic lobe. 3. Atrophy of the pancreatic parenchyma which contains multiple cystic structures. If there is a history of pancreatitis these findings may reflect multiple pseudocysts. Differential considerations include branch duct IPMNs and multiple serous cystadenoma. Followup examination in 1 year with MRI is recommended. Preferably this should be performed with contrast material. 4. Left renal atrophy. 5. Bilateral renal cysts. 6. Small right pleural effusion.   Electronically Signed   By: Kerby Moors M.D.   On: 07/01/2014 08:45   Dg Ercp Biliary & Pancreatic Ducts  07/08/2014   CLINICAL DATA:  Common bile duct stones.  EXAM: ERCP  TECHNIQUE: Multiple spot images obtained with the  fluoroscopic device and submitted for interpretation post-procedure.  COMPARISON:  MRCP - 07/01/2014; abdominal CT - 06/28/2014  FINDINGS: Four spot intraoperative radiographic images of the right upper abdominal quadrant during ERCP are provided for review.  Initial  image demonstrates an ERCP probe overlying the right upper abdominal quadrant. There is selective cannulation and opacification of the common bile duct which appears moderately dilated. There are at least 3 discrete filling defects seen within the common bile duct which are likely representative of the suspected gallstones seen on preceding MRCP.  Subsequent images demonstrate insufflation of a balloon within the common bile duct with subsequent biliary sweeping and presumed sphincterotomy.  IMPRESSION: ERCP with findings of choledocholithiasis and subsequent stone extraction.  These images were submitted for radiologic interpretation only. Please see the procedural report for the amount of contrast and the fluoroscopy time utilized.   Electronically Signed   By: Sandi Mariscal M.D.   On: 07/08/2014 15:43     PERTINENT LAB RESULTS: CMP     Component Value Date/Time   NA 139 07/09/2014 0645   NA 140 06/10/2014 1148   NA 140 09/04/2012 1309   K 4.2 07/09/2014 0645   K 4.7 09/04/2012 1309   CL 99 07/09/2014 0645   CL 101 09/04/2012 1309   CO2 28 07/09/2014 0645   CO2 31* 09/04/2012 1309   GLUCOSE 96 07/09/2014 0645   GLUCOSE 261* 06/10/2014 1148   GLUCOSE 148* 09/04/2012 1309   BUN 30* 07/09/2014 0645   BUN 44* 06/10/2014 1148   BUN 41.0* 09/04/2012 1309   CREATININE 2.31* 07/09/2014 0645   CREATININE 2.21* 01/08/2013 1152   CREATININE 2.2* 09/04/2012 1309   CALCIUM 8.9 07/09/2014 0645   CALCIUM 9.3 09/04/2012 1309   PROT 6.3 07/09/2014 0645   PROT 7.2 07/07/2013 1626   PROT 6.0* 09/04/2012 1309   ALBUMIN 2.7* 07/09/2014 0645   ALBUMIN 2.7* 09/04/2012 1309   AST 65* 07/09/2014 0645   AST 18 09/04/2012 1309   ALT 122* 07/09/2014 0645   ALT 21 09/04/2012  1309   ALKPHOS 590* 07/09/2014 0645   ALKPHOS 73 09/04/2012 1309   BILITOT 1.5* 07/09/2014 0645   BILITOT 0.40 09/04/2012 1309   GFRNONAA 24* 07/09/2014 0645   GFRAA 28* 07/09/2014 0645    BRIEF HOSPITAL COURSE:  Choledocholithiasis with abdominal pain  - Was associated with fever and elevated LFTs, subsequent workup including a - MRCP done 7/22 and showed multiple stones in the CBD and ductal dilitation. Gastroenterology was then consulted.On 7/24 ERCP was attempted by Dr. Paulita Fujita, but unfortunately a tight esophageal stricture prevented even the 10 mm diameter endoscope from passing thru his esophagus. Dr. Radene Journey (ENT) has performed previous esophageal dilations on Mr. Blyden. On - 7/28 Esophageal dilation performed dilating from 72mm to 30mm by Dr. Lucia Gaskins. Patient then on 7/29 underwent ECRP with sphincterotomy and stone extraction done 07/29 by Dr. Teena Irani. Currently doing very well post ERCP, diet has been advanced, patient has been cleared by gastroneurology for discharge. LFTs still elevated, but suspect will slowly down trend. Please check a LFT at next visit.  E Coli Bacteremia  -Blood culture with 2/2 e. coli. Transitioned to rocephin per sensitivities.  -Source felt to be related to choledocholithiasis.  -Discharged on Keflex 500 mg q 12 hours x 2 days.  Acute on Chronic kidney disease stage III .  -Baseline creatinine appears to be 2.1 - 2.2  -Back to baseline at discharge  Hypertension  Patient had been slightly bradycardic and BP had been elevated.  Decreased coreg to 3.125 bid and increased hydralazine to 50 mg TID.  No ACE/ARB due to CKD.  Discharged with home regimen of Carvedilol and Hydralazine   Type 2 diabetes  Diet controlled. Not on any diabetic medications at home. Diabetic diet. CBGs have remained stable with SSI while inpatient  Coronary artery disease and status post aortic valve replacement  Continued home meds   Chronic systolic congestive heart  failure  Compensated. Continued to monitor throughout hospital stay. Patient was not on ACE or ARB due to chronic kidney disease stage III.   Anemia  Due to chronic kidney disease. Hemoglobin was stable  TODAY-DAY OF DISCHARGE:  Subjective:   Beckie Salts s/p day 1 ERCP with successful stone removal, has no complaints of abdominal pain, nausea/vomiting/diarrhea, no chest pain or SOB, no new weakness, tingling, or numbness, feels much better and wants to go home today. No issues with urination or BMs.  Patient tolerating his diet and ambulating well.  Objective:   Blood pressure 149/66, pulse 60, temperature 99.2 F (37.3 C), temperature source Oral, resp. rate 15, height 5\' 8"  (1.727 m), weight 65.9 kg (145 lb 4.5 oz), SpO2 96.00%.  Intake/Output Summary (Last 24 hours) at 07/09/14 1127 Last data filed at 07/09/14 0727  Gross per 24 hour  Intake    980 ml  Output    800 ml  Net    180 ml   Filed Weights   07/07/14 0514 07/08/14 0415 07/09/14 0523  Weight: 63.957 kg (141 lb) 65.4 kg (144 lb 2.9 oz) 65.9 kg (145 lb 4.5 oz)    Exam Gen Exam: Awake and alert with clear speech. Oriented x 3, normal affect Neck: Supple, No JVD. No cervical lymphadenopathy Chest: B/L Clear. Symmetrical chest wall movement CVS: S1 S2 Regular, no murmurs, rubs, gallops noted. Abdomen: soft, BS +, non tender, non distended, no organomegaly, rebound, or gaurding Extremities: no edema, lower extremities warm to touch. No cyanosis, clubbing Neurologic: Non Focal.  Skin: No Rash or bruises noted Wounds: N/A.   DISCHARGE CONDITION: Stable  DISPOSITION: Home   DISCHARGE INSTRUCTIONS:    Activity:  As tolerated with full fall precautions use walker/cane & assistance as needed  Diet recommendation: Diabetic Diet Heart Healthy diet  Discharge Instructions   Activity as tolerated - No restrictions    Complete by:  As directed      Call MD for:  persistant nausea and vomiting    Complete by:   As directed      Call MD for:  severe uncontrolled pain    Complete by:  As directed      Diet general    Complete by:  As directed           Follow-up Information   Follow up with [name], [name], DO. Schedule an appointment as soon as possible for a visit in 1 week.   Specialty:  Geriatric Medicine   Contact information:   [address] [place] [address] [phone]       Total Time spent on discharge equals 45 minutes.  Signed: [employer] 07/09/2014 11:27 AM  **Disclaimer: This note may have been dictated with voice recognition software. Similar sounding words can inadvertently be transcribed and this note may contain transcription errors which may not have been corrected upon publication of note.**

## 2014-07-09 NOTE — Progress Notes (Signed)
[facility] Progress Note  Subjective: Patient feels great no abdominal pain, tolerating full liquid diet  Objective: Vital signs in last 24 hours: Temp:  [97.6 F (36.4 C)-99.2 F (37.3 C)] 99.2 F (37.3 C) (07/30 0523) Pulse Rate:  [51-61] 60 (07/30 0523) Resp:  [14-26] 15 (07/30 0523) BP: (119-178)/(38-66) 149/66 mmHg (07/30 0523) SpO2:  [96 %-100 %] 96 % (07/30 0523) Weight:  [65.9 kg (145 lb 4.5 oz)] 65.9 kg (145 lb 4.5 oz) (07/30 0523) Weight change: 0.5 kg (1 lb 1.6 oz)   PE: Alert and oriented, no icterus  Lab Results: Results for orders placed during the hospital encounter of 06/28/14 (from the past 24 hour(s))  GLUCOSE, CAPILLARY     Status: Abnormal   Collection Time    07/08/14 12:27 PM      Result Value Ref Range   Glucose-Capillary 125 (*) 70 - 99 mg/dL  GLUCOSE, CAPILLARY     Status: Abnormal   Collection Time    07/08/14  3:47 PM      Result Value Ref Range   Glucose-Capillary 113 (*) 70 - 99 mg/dL  GLUCOSE, CAPILLARY     Status: Abnormal   Collection Time    07/08/14  5:51 PM      Result Value Ref Range   Glucose-Capillary 114 (*) 70 - 99 mg/dL  GLUCOSE, CAPILLARY     Status: Abnormal   Collection Time    07/08/14  9:32 PM      Result Value Ref Range   Glucose-Capillary 202 (*) 70 - 99 mg/dL  COMPREHENSIVE METABOLIC PANEL     Status: Abnormal   Collection Time    07/09/14  6:45 AM      Result Value Ref Range   Sodium 139  137 - 147 mEq/L   Potassium 4.2  3.7 - 5.3 mEq/L   Chloride 99  96 - 112 mEq/L   CO2 28  19 - 32 mEq/L   Glucose, Bld 96  70 - 99 mg/dL   BUN 30 (*) 6 - 23 mg/dL   Creatinine, Ser 2.31 (*) 0.50 - 1.35 mg/dL   Calcium 8.9  8.4 - 10.5 mg/dL   Total Protein 6.3  6.0 - 8.3 g/dL   Albumin 2.7 (*) 3.5 - 5.2 g/dL   AST 65 (*) 0 - 37 U/L   ALT 122 (*) 0 - 53 U/L   Alkaline Phosphatase 590 (*) 39 - 117 U/L   Total Bilirubin 1.5 (*) 0.3 - 1.2 mg/dL   GFR calc non Af Amer 24 (*) >90 mL/min   GFR calc Af Amer 28 (*) >90  mL/min   Anion gap 12  5 - 15  GLUCOSE, CAPILLARY     Status: Abnormal   Collection Time    07/09/14  7:56 AM      Result Value Ref Range   Glucose-Capillary 122 (*) 70 - 99 mg/dL    Studies/Results: Dg Ercp Biliary & Pancreatic Ducts  07/08/2014   CLINICAL DATA:  Common bile duct stones.  EXAM: ERCP  TECHNIQUE: Multiple spot images obtained with the fluoroscopic device and submitted for interpretation post-procedure.  COMPARISON:  MRCP - 07/01/2014; abdominal CT - 06/28/2014  FINDINGS: Four spot intraoperative radiographic images of the right upper abdominal quadrant during ERCP are provided for review.  Initial image demonstrates an ERCP probe overlying the right upper abdominal quadrant. There is selective cannulation and opacification of the common bile duct which appears moderately dilated. There are at least 3 discrete  filling defects seen within the common bile duct which are likely representative of the suspected gallstones seen on preceding MRCP.  Subsequent images demonstrate insufflation of a balloon within the common bile duct with subsequent biliary sweeping and presumed sphincterotomy.  IMPRESSION: ERCP with findings of choledocholithiasis and subsequent stone extraction.  These images were submitted for radiologic interpretation only. Please see the procedural report for the amount of contrast and the fluoroscopy time utilized.   Electronically Signed   By: Sandi Mariscal M.D.   On: 07/08/2014 15:43      Assessment: Common bile duct stones, status post ERCP with successful removal  Plan: Advanced to soft mechanical diet, okay for discharge prn from GI standpoint. Does not need specific GI followup. We'll sign off for now.   Tula Schryver C 07/09/2014, 9:48 AM

## 2014-07-09 NOTE — Anesthesia Postprocedure Evaluation (Signed)
Anesthesia Post Note  Patient: [name] [name]  Procedure(s) Performed: Procedure(s) (LRB): ENDOSCOPIC RETROGRADE CHOLANGIOPANCREATOGRAPHY (ERCP) (N/A)  Anesthesia type: General  Patient location: PACU  Post pain: Pain level controlled  Post assessment: Patient's Cardiovascular Status Stable  Last Vitals:  Filed Vitals:   07/09/14 0523  BP: 149/66  Pulse: 60  Temp: 37.3 C  Resp: 15    Post vital signs: Reviewed and stable  Level of consciousness: alert  Complications: No apparent anesthesia complications

## 2014-07-13 DIAGNOSIS — K222 Esophageal obstruction: Secondary | ICD-10-CM | POA: Diagnosis not present

## 2014-07-15 ENCOUNTER — Ambulatory Visit: Payer: Medicare Other | Admitting: Nurse Practitioner

## 2014-07-16 ENCOUNTER — Encounter: Payer: Self-pay | Admitting: Internal Medicine

## 2014-07-16 ENCOUNTER — Other Ambulatory Visit: Payer: Self-pay | Admitting: Internal Medicine

## 2014-07-16 ENCOUNTER — Ambulatory Visit (INDEPENDENT_AMBULATORY_CARE_PROVIDER_SITE_OTHER): Payer: Medicare Other | Admitting: Internal Medicine

## 2014-07-16 VITALS — BP 174/70 | HR 49 | Temp 97.6°F | Resp 18 | Ht 68.0 in | Wt 144.4 lb

## 2014-07-16 DIAGNOSIS — K222 Esophageal obstruction: Secondary | ICD-10-CM

## 2014-07-16 DIAGNOSIS — I5022 Chronic systolic (congestive) heart failure: Secondary | ICD-10-CM | POA: Diagnosis not present

## 2014-07-16 DIAGNOSIS — K8051 Calculus of bile duct without cholangitis or cholecystitis with obstruction: Secondary | ICD-10-CM | POA: Diagnosis not present

## 2014-07-16 DIAGNOSIS — E119 Type 2 diabetes mellitus without complications: Secondary | ICD-10-CM

## 2014-07-16 DIAGNOSIS — R7401 Elevation of levels of liver transaminase levels: Secondary | ICD-10-CM | POA: Diagnosis not present

## 2014-07-16 DIAGNOSIS — K8033 Calculus of bile duct with acute cholangitis with obstruction: Secondary | ICD-10-CM

## 2014-07-16 DIAGNOSIS — R7402 Elevation of levels of lactic acid dehydrogenase (LDH): Secondary | ICD-10-CM

## 2014-07-16 DIAGNOSIS — K8309 Other cholangitis: Secondary | ICD-10-CM | POA: Diagnosis not present

## 2014-07-16 DIAGNOSIS — I2589 Other forms of chronic ischemic heart disease: Secondary | ICD-10-CM

## 2014-07-16 DIAGNOSIS — R740 Nonspecific elevation of levels of transaminase and lactic acid dehydrogenase [LDH]: Secondary | ICD-10-CM

## 2014-07-16 NOTE — Progress Notes (Signed)
Patient ID: [name] [name], male   DOB: Feb 25, 1926, 80 y.o.   MRN: [id number]   Location:  [facility]  Code Status: DNR   Allergies  Allergen Reactions  . Statins Other (See Comments)    Pain/weakness in legs    Chief Complaint  Patient presents with  . Hospitalization Follow-up    HPI: Patient is a 80 y.o. white male seen in the office today for hospital f/u.    July 19th had abdominal pain across his upper abdomen.  Kept until 7/31.  Had to have esophagus stretched.  Had 4 bile duct stones removed (ERCP).  Been fine since they were taken out.  Had bacteremia also and was on antibiotics the whole time.  Looks like at first they thought he had pneumonia.  Back to eating well.  Food goes down much easier. This was 4th dilatation.  His eating had gotten very slow.  Sleeping well.  No pain.    Review of Systems:  Review of Systems  Constitutional: Negative for fever and malaise/fatigue.  HENT: Negative for congestion.   Eyes: Negative for blurred vision.  Respiratory: Negative for shortness of breath.   Cardiovascular: Negative for chest pain and leg swelling.  Gastrointestinal: Negative for abdominal pain and constipation.  Genitourinary: Negative for dysuria, urgency and frequency.  Musculoskeletal: Negative for falls and myalgias.  Skin: Negative for rash.  Neurological: Negative for dizziness, loss of consciousness, weakness and headaches.  Endo/Heme/Allergies: Does not bruise/bleed easily.  Psychiatric/Behavioral: Negative for memory loss.     Past Medical History  Diagnosis Date  . History of colon cancer     s/p colon resection  . Aortic stenosis     a. s/p AVR with 31mm Edwards pericardial valve 02/07/12 - post-op course complicated by pleural effusion requring thoracentesis/leg cellulitis 03/2012.  Marland Kitchen CAD (coronary artery disease)     a. s/p NSTEMI 12/2011:  LHC - Ostial left main 20%, ostial LAD 50%, mid 60-70%, ostial D1  40% and mid 40%, D2 70%, ostial circumflex occluded, ostial RCA 80-90%, LVEDP was 42. b.  s/p CABG x 3 at time of AVR (LiMA-LAD, SVG-2nd daigonal, SVG-PDA) 02/07/12 (post-op course noted above).  . Ischemic cardiomyopathy   . Chronic systolic heart failure     a. TEE 01/2012: EF 25-30%, diffuse hypokinesis. b. Not on ACEI due to renal insufficiency.;  c. follow up  echo 08/06/12: EF 25%, mod diast dysfxn, AVR ok, mild MR, mod LAE, mild RAE, mild to mod RV systolic dysfunction  . HLD (hyperlipidemia)   . GERD (gastroesophageal reflux disease)   . Chronic kidney disease   . Pleural effusion     a. post-operatively after AVR/CABG s/p thoracentesis 03/2012 yielding 1L serosanguinous fluid.  . Diabetes mellitus     borderline  . Blood transfusion     NO REACTION TO TRANSFUSION  . Cellulitis     a. RLE cellulitis 2 months post-operatively after AVR/CABG - serratia marcessans, tx with I&D/antibiotics  . Mitral regurgitation     Moderate by TEE 01/2012  . Flash pulmonary edema     Post-cath 12/2011, went into acute pulm edema requiring IV lasix and intubation  . Baker's cyst 07/23/12    Incidental finding of LE venous dopplers  . Cataract     right eye, hx of  . HTN (hypertension)     primary, Dr. Jose Persia  . Myocardial infarction 12/2011  . CHF (congestive heart failure)   . Stroke 10/01  RIght Leg weakness  . Cancer '90's    Colon  . Anemia     Past Surgical History  Procedure Laterality Date  . Colon resection  1996  . Esophageal dilation    . Colonoscopy    . Cataract extraction      rt  . Cardiac catheterization      1.3.13  stopped breathing, put on ventilator for 5-6 days  . Tonsillectomy    . Aortic valve replacement  02/07/2012    Procedure: AORTIC VALVE REPLACEMENT (AVR);  Surgeon: Gaye Pollack, MD;  Location: [place];  Service: Open Heart Surgery;  Laterality: N/A;  . Coronary artery bypass graft  02/07/2012    Procedure: CORONARY ARTERY BYPASS GRAFTING (CABG);   Surgeon: Gaye Pollack, MD;  Location: [place];  Service: Open Heart Surgery;  Laterality: N/A;  CABG x three; using right leg greater saphenous vein harvested endoscopically  . Chest tube insertion  07/24/2012    Procedure: INSERTION PLEURAL DRAINAGE CATHETER;  Surgeon: Gaye Pollack, MD;  Location: [place];  Service: Thoracic;  Laterality: Left;  . Talc pleurodesis  08/30/2012    Procedure: TALC PLEURADESIS;  Surgeon: Gaye Pollack, MD;  Location: [place];  Service: Thoracic;  Laterality: Left;  INSERTION OF TALC VIA LEFT PLEURX  . Talc pleurodesis  09/12/2012    Procedure: TALC PLEURADESIS;  Surgeon: Gaye Pollack, MD;  Location: [place];  Service: Thoracic;  Laterality: Left;  . Removal of pleural drainage catheter  09/27/2012    Procedure: REMOVAL OF PLEURAL DRAINAGE CATHETER;  Surgeon: Gaye Pollack, MD;  Location: [place];  Service: Thoracic;  Laterality: Left;  Marland Kitchen Eye surgery  2009    cataract removed from right eye  . Esophagogastroduodenoscopy N/A 07/03/2014    Procedure: ESOPHAGOGASTRODUODENOSCOPY (EGD);  Surgeon: Arta Silence, MD;  Location: [facility] ENDOSCOPY;  Service: Endoscopy;  Laterality: N/A;  . Esophagoscopy with dilitation N/A 07/07/2014    Procedure: ESOPHAGOSCOPY WITH DILITATION/SAVARY DILATOR;  Surgeon: Rozetta Nunnery, MD;  Location: [place];  Service: ENT;  Laterality: N/A;  . Ercp N/A 07/08/2014    Procedure: ENDOSCOPIC RETROGRADE CHOLANGIOPANCREATOGRAPHY (ERCP);  Surgeon: Missy Sabins, MD;  Location: [facility] ENDOSCOPY;  Service: Endoscopy;  Laterality: N/A;    Social History:   reports that he has never smoked. He has never used smokeless tobacco. He reports that he does not drink alcohol or use illicit drugs.  Family History  Problem Relation Age of Onset  . Cancer Mother   . Heart attack Father   . Mental illness Brother   . Kidney failure Brother     Medications: Patient's Medications  New Prescriptions   No medications on file  Previous Medications   AMOXICILLIN  (AMOXIL) 500 MG CAPSULE    Take 2,000 mg by mouth See admin instructions. Take 4 capsules (2000 mg) prior to dental appointment   ASPIRIN EC 325 MG TABLET    Take 325 mg by mouth daily.    CARVEDILOL (COREG) 25 MG TABLET    Take 1 tablet (25 mg total) by mouth 2 (two) times daily with a meal.   CHOLECALCIFEROL (VITAMIN D) 1000 UNITS TABLET    Take 1,000 Units by mouth daily with supper.    FUROSEMIDE (LASIX) 40 MG TABLET    Take 40 mg by mouth 2 (two) times daily. At breakfast and at lunch   HYDRALAZINE (APRESOLINE) 25 MG TABLET    Take 1 tablet (25 mg total) by mouth 3 (three)  times daily.   ISOSORBIDE MONONITRATE (IMDUR) 60 MG 24 HR TABLET    Take 1 tablet (60 mg total) by mouth daily.   MULTIPLE VITAMIN (MULTIVITAMIN WITH MINERALS) TABS TABLET    Take 1 tablet by mouth daily.   NITROGLYCERIN (NITROSTAT) 0.4 MG SL TABLET    Place 0.4 mg under the tongue every 5 (five) minutes as needed for chest pain.   OMEPRAZOLE (PRILOSEC) 20 MG CAPSULE    Take 20 mg by mouth daily.    PRENATAL VIT-FE FUMARATE-FA (MULTIVITAMIN-PRENATAL) 27-0.8 MG TABS    Take 1 tablet by mouth daily with supper.    TAMSULOSIN (FLOMAX) 0.4 MG CAPS CAPSULE    Take 0.4 mg by mouth daily after supper. Take 30 minutes after eating   TRAVOPROST, BAK FREE, (TRAVATAN) 0.004 % SOLN OPHTHALMIC SOLUTION    Place 1 drop into both eyes at bedtime.   Modified Medications   No medications on file  Discontinued Medications   CEPHALEXIN (KEFLEX) 500 MG CAPSULE    Take 1 capsule (500 mg total) by mouth every 12 (twelve) hours.     Physical Exam: Filed Vitals:   07/16/14 1528  BP: 174/70  Pulse: 49  Temp: 97.6 F (36.4 C)  TempSrc: Oral  Resp: 18  Height: 5\' 8"  (1.727 m)  Weight: 144 lb 6.4 oz (65.499 kg)  SpO2: 98%  Physical Exam  Constitutional: He is oriented to person, place, and time. He appears well-developed and well-nourished.  Thin white male  Cardiovascular: Normal rate, regular rhythm, normal heart sounds and intact  distal pulses.   Pulmonary/Chest: Effort normal and breath sounds normal. No respiratory distress. He has no rales.  Abdominal: Soft. Bowel sounds are normal. He exhibits no distension and no mass. There is no tenderness.  Musculoskeletal: Normal range of motion. He exhibits no edema and no tenderness.  Neurological: He is alert and oriented to person, place, and time.  Skin: Skin is warm and dry.  Psychiatric: He has a normal mood and affect.    Labs reviewed: Basic Metabolic Panel:  Recent Labs  07/04/14 0440 07/08/14 0546 07/09/14 0645  NA 139 137 139  K 3.9 3.9 4.2  CL 98 97 99  CO2 26 25 28   GLUCOSE 119* 120* 96  BUN 29* 29* 30*  CREATININE 2.28* 2.27* 2.31*  CALCIUM 8.9 8.9 8.9   Liver Function Tests:  Recent Labs  07/04/14 0440 07/08/14 0546 07/09/14 0645  AST 38* 149* 65*  ALT 69* 183* 122*  ALKPHOS 302* 634* 590*  BILITOT 1.1 2.7* 1.5*  PROT 6.2 6.1 6.3  ALBUMIN 2.5* 2.6* 2.7*    Recent Labs  06/28/14 2058 06/30/14 0520  LIPASE 34 11   No results found for this basename: AMMONIA,  in the last 8760 hours CBC:  Recent Labs  06/03/14 1239 06/28/14 2058  06/30/14 0520 07/03/14 1140 07/04/14 0440  WBC 5.7 7.1  < > 7.2 5.2 5.6  NEUTROABS 3.0 5.8  --  5.4  --   --   HGB 11.7* 12.1*  < > 10.1* 10.8* 10.9*  HCT 36.1* 37.5*  < > 30.5* 32.3* 33.8*  MCV 99.6* 101.9*  < > 98.4 98.5 97.7  PLT 128* 140*  < > 102* 113* 125*  < > = values in this interval not displayed. Lipid Panel:  Recent Labs  03/19/14 1345  CHOL 137  HDL 36*  LDLCALC 78  TRIG 113  CHOLHDL 3.8   Lab Results  Component Value Date  HGBA1C 6.6* 07/07/2013    Past Procedures:  Assessment/Plan 1. Calculus of bile duct with acute cholangitis with obstruction -s/p ERCP, also had - Hepatic Function Panel  2. Transaminitis - f/u labs: - Hepatic Function Panel  3. Chronic systolic heart failure -cont current meds, doing well -weights stable -bp up today with white  coat-always ok at home  4. Type II or unspecified type diabetes mellitus without mention of complication, not stated as uncontrolled -glucose has been well controlled  5.  Esophageal stenosis--s/p dilatation -eating well now  Labs/tests ordered:   Orders Placed This Encounter  Procedures  . Hepatic Function Panel   Next appt:  3 mos

## 2014-07-16 NOTE — Progress Notes (Signed)
Patient ID: [name] [name], male   DOB: 02/19/1926, 80 y.o.   MRN: [id number] Please note that this visit was a hospital f/u within 1 week from discharge.  99496 is the proper cpt code.

## 2014-07-17 ENCOUNTER — Telehealth: Payer: Self-pay | Admitting: Oncology

## 2014-07-17 LAB — HEPATIC FUNCTION PANEL
ALT: 81 IU/L — ABNORMAL HIGH (ref 0–44)
AST: 56 IU/L — ABNORMAL HIGH (ref 0–40)
Albumin: 3.9 g/dL (ref 3.5–4.7)
Alkaline Phosphatase: 599 IU/L — ABNORMAL HIGH (ref 39–117)
Bilirubin, Direct: 0.57 mg/dL — ABNORMAL HIGH (ref 0.00–0.40)
Total Bilirubin: 0.9 mg/dL (ref 0.0–1.2)
Total Protein: 6.8 g/dL (ref 6.0–8.5)

## 2014-07-17 NOTE — Telephone Encounter (Signed)
s.w. pt and advised on 9.16 appt moved to 9.21 AJ on pal....pt ok and aware

## 2014-07-20 ENCOUNTER — Encounter: Payer: Self-pay | Admitting: *Deleted

## 2014-07-22 ENCOUNTER — Ambulatory Visit: Payer: Medicare Other | Admitting: Nurse Practitioner

## 2014-07-22 ENCOUNTER — Other Ambulatory Visit: Payer: Medicare Other

## 2014-07-22 ENCOUNTER — Ambulatory Visit: Payer: Medicare Other

## 2014-07-29 ENCOUNTER — Ambulatory Visit: Payer: Medicare Other

## 2014-07-29 ENCOUNTER — Other Ambulatory Visit (HOSPITAL_BASED_OUTPATIENT_CLINIC_OR_DEPARTMENT_OTHER): Payer: Medicare Other

## 2014-07-29 DIAGNOSIS — D631 Anemia in chronic kidney disease: Secondary | ICD-10-CM

## 2014-07-29 DIAGNOSIS — N189 Chronic kidney disease, unspecified: Secondary | ICD-10-CM

## 2014-07-29 DIAGNOSIS — N179 Acute kidney failure, unspecified: Secondary | ICD-10-CM

## 2014-07-29 DIAGNOSIS — D649 Anemia, unspecified: Secondary | ICD-10-CM

## 2014-07-29 LAB — CBC WITH DIFFERENTIAL/PLATELET
BASO%: 0.4 % (ref 0.0–2.0)
Basophils Absolute: 0 10*3/uL (ref 0.0–0.1)
EOS%: 3 % (ref 0.0–7.0)
Eosinophils Absolute: 0.2 10*3/uL (ref 0.0–0.5)
HCT: 33.3 % — ABNORMAL LOW (ref 38.4–49.9)
HGB: 10.9 g/dL — ABNORMAL LOW (ref 13.0–17.1)
LYMPH%: 35 % (ref 14.0–49.0)
MCH: 33 pg (ref 27.2–33.4)
MCHC: 32.9 g/dL (ref 32.0–36.0)
MCV: 100.1 fL — ABNORMAL HIGH (ref 79.3–98.0)
MONO#: 0.4 10*3/uL (ref 0.1–0.9)
MONO%: 6.6 % (ref 0.0–14.0)
NEUT%: 55 % (ref 39.0–75.0)
NEUTROS ABS: 3 10*3/uL (ref 1.5–6.5)
Platelets: 137 10*3/uL — ABNORMAL LOW (ref 140–400)
RBC: 3.32 10*6/uL — AB (ref 4.20–5.82)
RDW: 14.4 % (ref 11.0–14.6)
WBC: 5.4 10*3/uL (ref 4.0–10.3)
lymph#: 1.9 10*3/uL (ref 0.9–3.3)

## 2014-07-29 MED ORDER — DARBEPOETIN ALFA-POLYSORBATE 300 MCG/0.6ML IJ SOLN: 300.0000 ug | Freq: Once | INTRAMUSCULAR | Status: DC

## 2014-08-03 ENCOUNTER — Ambulatory Visit: Payer: Medicare Other | Admitting: Internal Medicine

## 2014-08-10 ENCOUNTER — Other Ambulatory Visit: Payer: Self-pay | Admitting: Internal Medicine

## 2014-08-19 ENCOUNTER — Ambulatory Visit: Payer: Medicare Other | Admitting: Physician Assistant

## 2014-08-19 ENCOUNTER — Other Ambulatory Visit: Payer: Medicare Other

## 2014-08-19 ENCOUNTER — Ambulatory Visit: Payer: Medicare Other

## 2014-08-26 ENCOUNTER — Ambulatory Visit: Payer: Medicare Other

## 2014-08-26 ENCOUNTER — Other Ambulatory Visit: Payer: Medicare Other

## 2014-08-26 ENCOUNTER — Ambulatory Visit: Payer: Medicare Other | Admitting: Physician Assistant

## 2014-08-31 ENCOUNTER — Telehealth: Payer: Self-pay | Admitting: Oncology

## 2014-08-31 ENCOUNTER — Other Ambulatory Visit (HOSPITAL_BASED_OUTPATIENT_CLINIC_OR_DEPARTMENT_OTHER): Payer: Medicare Other

## 2014-08-31 ENCOUNTER — Ambulatory Visit: Payer: Medicare Other

## 2014-08-31 ENCOUNTER — Ambulatory Visit (HOSPITAL_BASED_OUTPATIENT_CLINIC_OR_DEPARTMENT_OTHER): Payer: Medicare Other | Admitting: Physician Assistant

## 2014-08-31 ENCOUNTER — Encounter: Payer: Self-pay | Admitting: Physician Assistant

## 2014-08-31 VITALS — BP 168/56 | HR 50 | Temp 97.8°F | Resp 19 | Ht 68.0 in | Wt 144.7 lb

## 2014-08-31 DIAGNOSIS — I2510 Atherosclerotic heart disease of native coronary artery without angina pectoris: Secondary | ICD-10-CM

## 2014-08-31 DIAGNOSIS — D649 Anemia, unspecified: Secondary | ICD-10-CM | POA: Diagnosis not present

## 2014-08-31 DIAGNOSIS — D631 Anemia in chronic kidney disease: Secondary | ICD-10-CM

## 2014-08-31 DIAGNOSIS — D696 Thrombocytopenia, unspecified: Secondary | ICD-10-CM

## 2014-08-31 DIAGNOSIS — N039 Chronic nephritic syndrome with unspecified morphologic changes: Secondary | ICD-10-CM

## 2014-08-31 DIAGNOSIS — N189 Chronic kidney disease, unspecified: Principal | ICD-10-CM

## 2014-08-31 DIAGNOSIS — N179 Acute kidney failure, unspecified: Secondary | ICD-10-CM

## 2014-08-31 DIAGNOSIS — I10 Essential (primary) hypertension: Secondary | ICD-10-CM | POA: Diagnosis not present

## 2014-08-31 LAB — CBC WITH DIFFERENTIAL/PLATELET
BASO%: 0.2 % (ref 0.0–2.0)
Basophils Absolute: 0 10*3/uL (ref 0.0–0.1)
EOS ABS: 0.1 10*3/uL (ref 0.0–0.5)
EOS%: 1.3 % (ref 0.0–7.0)
HCT: 37.1 % — ABNORMAL LOW (ref 38.4–49.9)
HGB: 12.1 g/dL — ABNORMAL LOW (ref 13.0–17.1)
LYMPH#: 2.6 10*3/uL (ref 0.9–3.3)
LYMPH%: 39.3 % (ref 14.0–49.0)
MCH: 32.4 pg (ref 27.2–33.4)
MCHC: 32.6 g/dL (ref 32.0–36.0)
MCV: 99.5 fL — ABNORMAL HIGH (ref 79.3–98.0)
MONO#: 0.5 10*3/uL (ref 0.1–0.9)
MONO%: 6.9 % (ref 0.0–14.0)
NEUT%: 52.3 % (ref 39.0–75.0)
NEUTROS ABS: 3.4 10*3/uL (ref 1.5–6.5)
PLATELETS: 127 10*3/uL — AB (ref 140–400)
RBC: 3.73 10*6/uL — AB (ref 4.20–5.82)
RDW: 13.9 % (ref 11.0–14.6)
WBC: 6.6 10*3/uL (ref 4.0–10.3)

## 2014-08-31 MED ORDER — DARBEPOETIN ALFA-POLYSORBATE 300 MCG/0.6ML IJ SOLN: 300.0000 ug | Freq: Once | INTRAMUSCULAR | Status: DC

## 2014-08-31 NOTE — Telephone Encounter (Signed)
gv adn printed appt sched and avs for pt for OCT thru March 2016

## 2014-08-31 NOTE — Progress Notes (Signed)
Hematology and Oncology Follow Up Visit  [name] [name] [id number] 10-Dec-1926 80 y.o. 08/31/2014 4:56 PM [name], [name], DOReed, [name] L, DO   Principle Diagnosis: 80 year old gentleman with anemia that is multifactorial likely due to chronic disease and renal insufficiency  Current therapy: Aranesp 300 mcg every 3 to 4  weeks for a hemoglobin less than 10  Interim History:  [name] [name] returns for routine followup with his wife. He is receiving Aranesp and tolerating well. He has not received any injection for the last few months but he does notice some improvement in terms of his fatigue. He reports that he is status post common bile duct stone removal by ERCP on 07/08/2014. He also is status post esophageal dilation during that hospitalization. He states that he is as previously had the esophageal dilation performed 3 times previously. Since having these procedures done he has felt considerably better overall. He denies chest pain, shortness of breath, dyspnea. He has not noticed any bleeding. Appetite has improvied and weight is stable. Lower extremity edema has improved. He has not reported any recent illness or hospitalization. He is not reporting any shortness of breath or excessive fatigue. He continues to perform activities of daily living without any hindrance or decline. He is able to drive and work outside his house without any major issues.  Medications: I have reviewed the patient's current medications.  Current Outpatient Prescriptions  Medication Sig Dispense Refill  . aspirin EC 325 MG tablet Take 325 mg by mouth daily.       . carvedilol (COREG) 25 MG tablet Take 1 tablet (25 mg total) by mouth 2 (two) times daily with a meal.  180 tablet  3  . cholecalciferol (VITAMIN D) 1000 UNITS tablet Take 1,000 Units by mouth daily with supper.       . furosemide (LASIX) 40 MG tablet TAKE 2 TABLETS BY MOUTH ONCE DAILY  180 tablet  1  . hydrALAZINE (APRESOLINE) 25 MG tablet Take 1 tablet  (25 mg total) by mouth 3 (three) times daily.  270 tablet  2  . isosorbide mononitrate (IMDUR) 60 MG 24 hr tablet Take 1 tablet (60 mg total) by mouth daily.  30 tablet  6  . Multiple Vitamin (MULTIVITAMIN WITH MINERALS) TABS tablet Take 1 tablet by mouth daily.      . nitroGLYCERIN (NITROSTAT) 0.4 MG SL tablet Place 0.4 mg under the tongue every 5 (five) minutes as needed for chest pain.      Marland Kitchen omeprazole (PRILOSEC) 20 MG capsule Take 20 mg by mouth daily.       . Prenatal Vit-Fe Fumarate-FA (MULTIVITAMIN-PRENATAL) 27-0.8 MG TABS Take 1 tablet by mouth daily with supper.       . tamsulosin (FLOMAX) 0.4 MG CAPS capsule Take 0.4 mg by mouth daily after supper. Take 30 minutes after eating      . Travoprost, BAK Free, (TRAVATAN) 0.004 % SOLN ophthalmic solution Place 1 drop into both eyes at bedtime.       Marland Kitchen amoxicillin (AMOXIL) 500 MG capsule Take 2,000 mg by mouth See admin instructions. Take 4 capsules (2000 mg) prior to dental appointment      . [DISCONTINUED] metFORMIN (GLUCOPHAGE) 500 MG tablet Take 500-1,000 mg by mouth. 2 tabs in the am, 1 tab in the pm       No current facility-administered medications for this visit.    Allergies:  Allergies  Allergen Reactions  . Statins Other (See Comments)    Pain/weakness in legs  Past Medical History, Surgical history, Social history, and Family History were reviewed and updated.  Review of Systems: Constitutional:  Negative for fever, chills, night sweats, anorexia, weight loss, pain. Cardiovascular: no chest pain or dyspnea on exertion Respiratory: no cough, shortness of breath, or wheezing Neurological: no TIA or stroke symptoms Dermatological: negative ENT: negative Skin: Negative. Gastrointestinal: no abdominal pain, change in bowel habits, or black or bloody stools Genito-Urinary: no dysuria, trouble voiding, or hematuria Hematological and Lymphatic: negative Musculoskeletal: negative Remaining ROS negative.  Physical  Exam: Blood pressure 168/56, pulse 50, temperature 97.8 F (36.6 C), temperature source Oral, resp. rate 19, height 5\' 8"  (1.727 m), weight 144 lb 11.2 oz (65.635 kg), SpO2 100.00%. ECOG: 1 General appearance: alert, cooperative and no distress appeared younger than stated age. Head: Normocephalic, without obvious abnormality, atraumatic Neck: no adenopathy, no carotid bruit, no JVD, supple, symmetrical, trachea midline and thyroid not enlarged, symmetric, no tenderness/mass/nodules Lymph nodes: Cervical, supraclavicular, and axillary nodes normal. Heart:regular rate and rhythm, S1, S2 normal, no murmur, click, rub or gallop Lung:chest clear, no wheezing, rales, normal symmetric air entry, no tachypnea, retractions or cyanosis Abdomen: soft, non-tender, without masses or organomegaly EXT:no erythema, induration, or nodules   Lab Results: Lab Results  Component Value Date   WBC 6.6 08/31/2014   HGB 12.1* 08/31/2014   HCT 37.1* 08/31/2014   MCV 99.5* 08/31/2014   PLT 127* 08/31/2014     Chemistry      Component Value Date/Time   NA 139 07/09/2014 0645   NA 140 06/10/2014 1148   NA 140 09/04/2012 1309   K 4.2 07/09/2014 0645   K 4.7 09/04/2012 1309   CL 99 07/09/2014 0645   CL 101 09/04/2012 1309   CO2 28 07/09/2014 0645   CO2 31* 09/04/2012 1309   BUN 30* 07/09/2014 0645   BUN 44* 06/10/2014 1148   BUN 41.0* 09/04/2012 1309   CREATININE 2.31* 07/09/2014 0645   CREATININE 2.21* 01/08/2013 1152   CREATININE 2.2* 09/04/2012 1309      Component Value Date/Time   CALCIUM 8.9 07/09/2014 0645   CALCIUM 9.3 09/04/2012 1309   ALKPHOS 599* 07/16/2014 1629   ALKPHOS 73 09/04/2012 1309   AST 56* 07/16/2014 1629   AST 18 09/04/2012 1309   ALT 81* 07/16/2014 1629   ALT 21 09/04/2012 1309   BILITOT 0.9 07/16/2014 1629   BILITOT 0.40 09/04/2012 1309     Impression and Plan: This is an 80 year old gentleman with the following issues: 1. Anemia, multifactorial. Likely due to chronic disease and renal insufficiency.  Hemoglobin is 11.8 today. Aranesp will not be given today. He will continue lab check every 4 weeks and Aranesp if Hgb <10.0. 2. Thrombocytopenia, mild. We will continue to watch this with future lab draws. He has no active bleeding. 3. Hypertension. Blood pressure is controlled with hydralazine, Coreg, and Lasix. 4. Coronary artery disease. He remains on aspirin, Coreg, and Imdur. 5.Common Bile Duct Stone- s/p ERCP 07/08/2014 with esophageal dilation during same admission. 6. Followup. He will be evaluated for a clinical visit in 6 months with monthly with labs and possible injection.     Carlton Adam, PA-C 9/21/20154:56 PM

## 2014-09-02 DIAGNOSIS — Z23 Encounter for immunization: Secondary | ICD-10-CM | POA: Diagnosis not present

## 2014-09-03 NOTE — Patient Instructions (Signed)
Continue monthly labs and Aranesp injection if needed Follow up in 6 months

## 2014-09-09 DIAGNOSIS — H409 Unspecified glaucoma: Secondary | ICD-10-CM | POA: Diagnosis not present

## 2014-09-09 DIAGNOSIS — H2510 Age-related nuclear cataract, unspecified eye: Secondary | ICD-10-CM | POA: Diagnosis not present

## 2014-09-09 DIAGNOSIS — H4011X0 Primary open-angle glaucoma, stage unspecified: Secondary | ICD-10-CM | POA: Diagnosis not present

## 2014-09-09 DIAGNOSIS — H5340 Unspecified visual field defects: Secondary | ICD-10-CM | POA: Diagnosis not present

## 2014-09-26 ENCOUNTER — Other Ambulatory Visit: Payer: Self-pay | Admitting: Nurse Practitioner

## 2014-09-30 ENCOUNTER — Other Ambulatory Visit (HOSPITAL_BASED_OUTPATIENT_CLINIC_OR_DEPARTMENT_OTHER): Payer: Medicare Other

## 2014-09-30 ENCOUNTER — Ambulatory Visit: Payer: Medicare Other

## 2014-09-30 DIAGNOSIS — D649 Anemia, unspecified: Secondary | ICD-10-CM | POA: Diagnosis not present

## 2014-09-30 LAB — CBC WITH DIFFERENTIAL/PLATELET
BASO%: 0.2 % (ref 0.0–2.0)
BASOS ABS: 0 10*3/uL (ref 0.0–0.1)
EOS%: 1.6 % (ref 0.0–7.0)
Eosinophils Absolute: 0.1 10*3/uL (ref 0.0–0.5)
HEMATOCRIT: 35.8 % — AB (ref 38.4–49.9)
HEMOGLOBIN: 11.6 g/dL — AB (ref 13.0–17.1)
LYMPH#: 2 10*3/uL (ref 0.9–3.3)
LYMPH%: 39.6 % (ref 14.0–49.0)
MCH: 32.5 pg (ref 27.2–33.4)
MCHC: 32.4 g/dL (ref 32.0–36.0)
MCV: 100.3 fL — ABNORMAL HIGH (ref 79.3–98.0)
MONO#: 0.4 10*3/uL (ref 0.1–0.9)
MONO%: 8.1 % (ref 0.0–14.0)
NEUT#: 2.6 10*3/uL (ref 1.5–6.5)
NEUT%: 50.5 % (ref 39.0–75.0)
Platelets: 113 10*3/uL — ABNORMAL LOW (ref 140–400)
RBC: 3.57 10*6/uL — ABNORMAL LOW (ref 4.20–5.82)
RDW: 13.5 % (ref 11.0–14.6)
WBC: 5.1 10*3/uL (ref 4.0–10.3)

## 2014-09-30 NOTE — Progress Notes (Signed)
Hgb: 11.6.  Patient aware, no aranesp given.

## 2014-10-05 ENCOUNTER — Other Ambulatory Visit: Payer: Self-pay | Admitting: Internal Medicine

## 2014-10-15 ENCOUNTER — Encounter: Payer: Self-pay | Admitting: Internal Medicine

## 2014-10-15 ENCOUNTER — Ambulatory Visit (INDEPENDENT_AMBULATORY_CARE_PROVIDER_SITE_OTHER): Payer: Medicare Other | Admitting: Internal Medicine

## 2014-10-15 VITALS — BP 150/60 | HR 55 | Temp 98.1°F | Ht 68.0 in | Wt 148.6 lb

## 2014-10-15 DIAGNOSIS — I255 Ischemic cardiomyopathy: Secondary | ICD-10-CM | POA: Diagnosis not present

## 2014-10-15 DIAGNOSIS — K222 Esophageal obstruction: Secondary | ICD-10-CM | POA: Diagnosis not present

## 2014-10-15 DIAGNOSIS — I5022 Chronic systolic (congestive) heart failure: Secondary | ICD-10-CM

## 2014-10-15 DIAGNOSIS — E118 Type 2 diabetes mellitus with unspecified complications: Secondary | ICD-10-CM | POA: Diagnosis not present

## 2014-10-15 DIAGNOSIS — I2510 Atherosclerotic heart disease of native coronary artery without angina pectoris: Secondary | ICD-10-CM | POA: Diagnosis not present

## 2014-10-15 DIAGNOSIS — D649 Anemia, unspecified: Secondary | ICD-10-CM

## 2014-10-15 DIAGNOSIS — N183 Chronic kidney disease, stage 3 unspecified: Secondary | ICD-10-CM

## 2014-10-15 DIAGNOSIS — I10 Essential (primary) hypertension: Secondary | ICD-10-CM

## 2014-10-15 NOTE — Patient Instructions (Signed)
Please let me know about your dermatologist follow up.

## 2014-10-15 NOTE — Progress Notes (Signed)
Patient ID: [name] [name], male   DOB: 21-Feb-1926, 80 y.o.   MRN: [id number]   Location:  [facility] [place] [facility]  Code Status: DNR, has advance directive form partially completed and will finish when his son from [place] comes to town  Allergies  Allergen Reactions  . Statins Other (See Comments)    Pain/weakness in legs    Chief Complaint  Patient presents with  . Medical Management of Chronic Issues    3 month follow up    HPI: Patient is a 80 y.o. white male seen in the office today for med mgt chronic diseases.  Looks much better than last time.    Has white coat hypertension.  Says within the last month (was actually before I saw him last time in August), went by ambulance with severe pain in his lower extremities, was first told pneumonia Had MRI and had large kidney stone and three smaller ones Couldn't intubate him as had esophageal stenosis--had to be dilated, then had stones removed and discharged  Since he's been fine.    Right leg pain has gotten much better.  Walks and takes exercise every day and it's finally turning around.  BP, sugars, and weights all stable on his record.  Left temple nevus with surrounding darkened area has grown.  I suggested he f/u with dermatology.    Review of Systems:  ROS  Past Medical History  Diagnosis Date  . History of colon cancer     s/p colon resection  . Aortic stenosis     a. s/p AVR with 87mm Edwards pericardial valve 02/07/12 - post-op course complicated by pleural effusion requring thoracentesis/leg cellulitis 03/2012.  Marland Kitchen CAD (coronary artery disease)     a. s/p NSTEMI 12/2011:  LHC - Ostial left main 20%, ostial LAD 50%, mid 60-70%, ostial D1 40% and mid 40%, D2 70%, ostial circumflex occluded, ostial RCA 80-90%, LVEDP was 42. b.  s/p CABG x 3 at time of AVR (LiMA-LAD, SVG-2nd daigonal, SVG-PDA) 02/07/12 (post-op course noted above).  . Ischemic cardiomyopathy   . Chronic systolic heart failure      a. TEE 01/2012: EF 25-30%, diffuse hypokinesis. b. Not on ACEI due to renal insufficiency.;  c. follow up  echo 08/06/12: EF 25%, mod diast dysfxn, AVR ok, mild MR, mod LAE, mild RAE, mild to mod RV systolic dysfunction  . HLD (hyperlipidemia)   . GERD (gastroesophageal reflux disease)   . Chronic kidney disease   . Pleural effusion     a. post-operatively after AVR/CABG s/p thoracentesis 03/2012 yielding 1L serosanguinous fluid.  . Diabetes mellitus     borderline  . Blood transfusion     NO REACTION TO TRANSFUSION  . Cellulitis     a. RLE cellulitis 2 months post-operatively after AVR/CABG - serratia marcessans, tx with I&D/antibiotics  . Mitral regurgitation     Moderate by TEE 01/2012  . Flash pulmonary edema     Post-cath 12/2011, went into acute pulm edema requiring IV lasix and intubation  . Baker's cyst 07/23/12    Incidental finding of LE venous dopplers  . Cataract     right eye, hx of  . HTN (hypertension)     primary, Dr. Jose Persia  . Myocardial infarction 12/2011  . CHF (congestive heart failure)   . Stroke 10/01    RIght Leg weakness  . Cancer '90's    Colon  . Anemia     Past Surgical History  Procedure  Laterality Date  . Colon resection  1996  . Esophageal dilation    . Colonoscopy    . Cataract extraction      rt  . Cardiac catheterization      1.3.13  stopped breathing, put on ventilator for 5-6 days  . Tonsillectomy    . Aortic valve replacement  02/07/2012    Procedure: AORTIC VALVE REPLACEMENT (AVR);  Surgeon: Gaye Pollack, MD;  Location: [place];  Service: Open Heart Surgery;  Laterality: N/A;  . Coronary artery bypass graft  02/07/2012    Procedure: CORONARY ARTERY BYPASS GRAFTING (CABG);  Surgeon: Gaye Pollack, MD;  Location: [place];  Service: Open Heart Surgery;  Laterality: N/A;  CABG x three; using right leg greater saphenous vein harvested endoscopically  . Chest tube insertion  07/24/2012    Procedure: INSERTION PLEURAL DRAINAGE CATHETER;   Surgeon: Gaye Pollack, MD;  Location: [place];  Service: Thoracic;  Laterality: Left;  . Talc pleurodesis  08/30/2012    Procedure: TALC PLEURADESIS;  Surgeon: Gaye Pollack, MD;  Location: [place];  Service: Thoracic;  Laterality: Left;  INSERTION OF TALC VIA LEFT PLEURX  . Talc pleurodesis  09/12/2012    Procedure: TALC PLEURADESIS;  Surgeon: Gaye Pollack, MD;  Location: [place];  Service: Thoracic;  Laterality: Left;  . Removal of pleural drainage catheter  09/27/2012    Procedure: REMOVAL OF PLEURAL DRAINAGE CATHETER;  Surgeon: Gaye Pollack, MD;  Location: [place];  Service: Thoracic;  Laterality: Left;  Marland Kitchen Eye surgery  2009    cataract removed from right eye  . Esophagogastroduodenoscopy N/A 07/03/2014    Procedure: ESOPHAGOGASTRODUODENOSCOPY (EGD);  Surgeon: Arta Silence, MD;  Location: [facility] ENDOSCOPY;  Service: Endoscopy;  Laterality: N/A;  . Esophagoscopy with dilitation N/A 07/07/2014    Procedure: ESOPHAGOSCOPY WITH DILITATION/SAVARY DILATOR;  Surgeon: Rozetta Nunnery, MD;  Location: [place];  Service: ENT;  Laterality: N/A;  . Ercp N/A 07/08/2014    Procedure: ENDOSCOPIC RETROGRADE CHOLANGIOPANCREATOGRAPHY (ERCP);  Surgeon: Missy Sabins, MD;  Location: [facility] ENDOSCOPY;  Service: Endoscopy;  Laterality: N/A;    Social History:   reports that he has never smoked. He has never used smokeless tobacco. He reports that he does not drink alcohol or use illicit drugs.  Family History  Problem Relation Age of Onset  . Cancer Mother   . Heart attack Father   . Mental illness Brother   . Kidney failure Brother     Medications: Patient's Medications  New Prescriptions   No medications on file  Previous Medications   AMOXICILLIN (AMOXIL) 500 MG CAPSULE    Take 2,000 mg by mouth See admin instructions. Take 4 capsules (2000 mg) prior to dental appointment   ASPIRIN EC 325 MG TABLET    Take 325 mg by mouth daily.    CARVEDILOL (COREG) 25 MG TABLET    Take 1 tablet (25 mg total) by mouth 2  (two) times daily with a meal.   CHOLECALCIFEROL (VITAMIN D) 1000 UNITS TABLET    Take 1,000 Units by mouth daily with supper.    FUROSEMIDE (LASIX) 40 MG TABLET    TAKE 2 TABLETS BY MOUTH ONCE DAILY   HYDRALAZINE (APRESOLINE) 25 MG TABLET    Take 1 tablet (25 mg total) by mouth 3 (three) times daily.   ISOSORBIDE MONONITRATE (IMDUR) 60 MG 24 HR TABLET    TAKE 1 TABLET BY MOUTH EVERY DAY   MULTIPLE VITAMIN (MULTIVITAMIN WITH MINERALS) TABS  TABLET    Take 1 tablet by mouth daily.   NITROGLYCERIN (NITROSTAT) 0.4 MG SL TABLET    Place 0.4 mg under the tongue every 5 (five) minutes as needed for chest pain.   OMEPRAZOLE (PRILOSEC) 20 MG CAPSULE    Take 20 mg by mouth daily.    PRENATAL VIT-FE FUMARATE-FA (MULTIVITAMIN-PRENATAL) 27-0.8 MG TABS    Take 1 tablet by mouth daily with supper.    TAMSULOSIN (FLOMAX) 0.4 MG CAPS CAPSULE    Take 0.4 mg by mouth daily after supper. Take 30 minutes after eating   TAMSULOSIN (FLOMAX) 0.4 MG CAPS CAPSULE    TAKE 1 CAPSULE BY MOUTH ONCE DAILY AFTER SUPPER   TRAVOPROST, BAK FREE, (TRAVATAN) 0.004 % SOLN OPHTHALMIC SOLUTION    Place 1 drop into both eyes at bedtime.   Modified Medications   No medications on file  Discontinued Medications   No medications on file     Physical Exam: Filed Vitals:   10/15/14 1428  BP: 150/60  Pulse: 55  Temp: 98.1 F (36.7 C)  TempSrc: Oral  Height: 5\' 8"  (1.727 m)  Weight: 148 lb 9.6 oz (67.405 kg)  SpO2: 97%  Physical Exam  Labs reviewed: Basic Metabolic Panel:  Recent Labs  07/04/14 0440 07/08/14 0546 07/09/14 0645  NA 139 137 139  K 3.9 3.9 4.2  CL 98 97 99  CO2 26 25 28   GLUCOSE 119* 120* 96  BUN 29* 29* 30*  CREATININE 2.28* 2.27* 2.31*  CALCIUM 8.9 8.9 8.9   Liver Function Tests:  Recent Labs  07/04/14 0440 07/08/14 0546 07/09/14 0645 07/16/14 1629  AST 38* 149* 65* 56*  ALT 69* 183* 122* 81*  ALKPHOS 302* 634* 590* 599*  BILITOT 1.1 2.7* 1.5* 0.9  PROT 6.2 6.1 6.3 6.8  ALBUMIN 2.5*  2.6* 2.7*  --     Recent Labs  06/28/14 2058 06/30/14 0520  LIPASE 34 11   No results for input(s): AMMONIA in the last 8760 hours. CBC:  Recent Labs  07/29/14 1306 08/31/14 1314 09/30/14 1308  WBC 5.4 6.6 5.1  NEUTROABS 3.0 3.4 2.6  HGB 10.9* 12.1* 11.6*  HCT 33.3* 37.1* 35.8*  MCV 100.1* 99.5* 100.3*  PLT 137* 127* 113*   Lipid Panel:  Recent Labs  03/19/14 1345  CHOL 137  HDL 36*  LDLCALC 78  TRIG 113  CHOLHDL 3.8   Lab Results  Component Value Date   HGBA1C 6.6* 07/07/2013   Assessment/Plan 1. Chronic systolic heart failure -stable, weights stable, he brought his recordings from home -cont current mgt as per cardiology  2. Chronic kidney disease, stage III (moderate) -has been stable as of most recnet labs, he is doing great, would not reassess at this time  3. Esophageal stenosis -no problems with this since July dilatation  4. Essential hypertension, benign -bp at goal at home, has white coat here  5. Anemia, unspecified anemia type -follows with hematology and receives aranesp as needed  6. Diabetes mellitus type 2, controlled, with complications -last YJE5U was stable, recheck next visit  7. Atherosclerosis of native coronary artery of native heart without angina pectoris -no recent chest pain, does have chronic dyspnea on exertion, but stable  Had flu shot at [facility]  Labs/tests ordered:  Will check bmp, hba1c at next appt Next appt:  4 mos  Shannia Jacuinde L. Shanira Tine, D.O. [facility] [address], [place] [address] Cell Phone (Mon-Fri 8am-5pm):  [phone]  On Call:  [phone] & follow prompts after 5pm & weekends Office Phone:  [phone] Office Fax:  [phone]

## 2014-10-27 ENCOUNTER — Other Ambulatory Visit: Payer: Self-pay | Admitting: *Deleted

## 2014-10-27 DIAGNOSIS — D6489 Other specified anemias: Secondary | ICD-10-CM

## 2014-10-28 ENCOUNTER — Ambulatory Visit: Payer: Medicare Other

## 2014-10-28 ENCOUNTER — Other Ambulatory Visit (HOSPITAL_BASED_OUTPATIENT_CLINIC_OR_DEPARTMENT_OTHER): Payer: Medicare Other

## 2014-10-28 DIAGNOSIS — D649 Anemia, unspecified: Secondary | ICD-10-CM | POA: Diagnosis not present

## 2014-10-28 DIAGNOSIS — D6489 Other specified anemias: Secondary | ICD-10-CM

## 2014-10-28 LAB — CBC WITH DIFFERENTIAL/PLATELET
BASO%: 0.2 % (ref 0.0–2.0)
BASOS ABS: 0 10*3/uL (ref 0.0–0.1)
EOS%: 1.6 % (ref 0.0–7.0)
Eosinophils Absolute: 0.1 10*3/uL (ref 0.0–0.5)
HEMATOCRIT: 36.3 % — AB (ref 38.4–49.9)
HEMOGLOBIN: 11.9 g/dL — AB (ref 13.0–17.1)
LYMPH%: 41.4 % (ref 14.0–49.0)
MCH: 32.4 pg (ref 27.2–33.4)
MCHC: 32.8 g/dL (ref 32.0–36.0)
MCV: 98.9 fL — AB (ref 79.3–98.0)
MONO#: 0.4 10*3/uL (ref 0.1–0.9)
MONO%: 6.9 % (ref 0.0–14.0)
NEUT#: 3 10*3/uL (ref 1.5–6.5)
NEUT%: 49.9 % (ref 39.0–75.0)
PLATELETS: 106 10*3/uL — AB (ref 140–400)
RBC: 3.67 10*6/uL — ABNORMAL LOW (ref 4.20–5.82)
RDW: 13.3 % (ref 11.0–14.6)
WBC: 6.1 10*3/uL (ref 4.0–10.3)
lymph#: 2.5 10*3/uL (ref 0.9–3.3)

## 2014-10-28 NOTE — Progress Notes (Signed)
Patient hgb: 11.9. Patient aware, No aranesp given

## 2014-11-14 ENCOUNTER — Other Ambulatory Visit (HOSPITAL_COMMUNITY): Payer: Self-pay | Admitting: Internal Medicine

## 2014-11-14 NOTE — Telephone Encounter (Signed)
Rx was sent to pharmacy electronically. 

## 2014-11-19 ENCOUNTER — Encounter (HOSPITAL_COMMUNITY): Payer: Self-pay | Admitting: Cardiology

## 2014-11-24 ENCOUNTER — Other Ambulatory Visit: Payer: Self-pay | Admitting: Oncology

## 2014-11-24 DIAGNOSIS — D508 Other iron deficiency anemias: Secondary | ICD-10-CM

## 2014-11-25 ENCOUNTER — Ambulatory Visit: Payer: Medicare Other

## 2014-11-25 ENCOUNTER — Other Ambulatory Visit (HOSPITAL_BASED_OUTPATIENT_CLINIC_OR_DEPARTMENT_OTHER): Payer: Medicare Other

## 2014-11-25 DIAGNOSIS — D508 Other iron deficiency anemias: Secondary | ICD-10-CM

## 2014-11-25 DIAGNOSIS — D649 Anemia, unspecified: Secondary | ICD-10-CM

## 2014-11-25 LAB — CBC WITH DIFFERENTIAL/PLATELET
BASO%: 0.3 % (ref 0.0–2.0)
BASOS ABS: 0 10*3/uL (ref 0.0–0.1)
EOS%: 1.9 % (ref 0.0–7.0)
Eosinophils Absolute: 0.1 10*3/uL (ref 0.0–0.5)
HCT: 35 % — ABNORMAL LOW (ref 38.4–49.9)
HGB: 11.5 g/dL — ABNORMAL LOW (ref 13.0–17.1)
LYMPH#: 1.8 10*3/uL (ref 0.9–3.3)
LYMPH%: 32.6 % (ref 14.0–49.0)
MCH: 32.5 pg (ref 27.2–33.4)
MCHC: 32.8 g/dL (ref 32.0–36.0)
MCV: 99.1 fL — ABNORMAL HIGH (ref 79.3–98.0)
MONO#: 0.4 10*3/uL (ref 0.1–0.9)
MONO%: 7.9 % (ref 0.0–14.0)
NEUT#: 3.2 10*3/uL (ref 1.5–6.5)
NEUT%: 57.3 % (ref 39.0–75.0)
Platelets: 126 10*3/uL — ABNORMAL LOW (ref 140–400)
RBC: 3.53 10*6/uL — ABNORMAL LOW (ref 4.20–5.82)
RDW: 13.4 % (ref 11.0–14.6)
WBC: 5.5 10*3/uL (ref 4.0–10.3)

## 2014-11-25 NOTE — Progress Notes (Signed)
Pt entered  Clinic today for labs and possible injection. Labs reveled Hgb of 11.5 and Hct of 35.0. Injection of Aranesp not given today due to lab levels. Pt instructed on results, given copy of labs and instructed to  Keep next appointments. Pt verbalized understanding of instructions

## 2014-11-26 ENCOUNTER — Ambulatory Visit: Payer: Medicare Other | Admitting: Internal Medicine

## 2014-12-23 ENCOUNTER — Other Ambulatory Visit (HOSPITAL_BASED_OUTPATIENT_CLINIC_OR_DEPARTMENT_OTHER): Payer: Medicare Other

## 2014-12-23 ENCOUNTER — Ambulatory Visit: Payer: Medicare Other

## 2014-12-23 DIAGNOSIS — D649 Anemia, unspecified: Secondary | ICD-10-CM | POA: Diagnosis not present

## 2014-12-23 DIAGNOSIS — D508 Other iron deficiency anemias: Secondary | ICD-10-CM

## 2014-12-23 LAB — CBC WITH DIFFERENTIAL/PLATELET
BASO%: 0.2 % (ref 0.0–2.0)
Basophils Absolute: 0 10*3/uL (ref 0.0–0.1)
EOS ABS: 0.1 10*3/uL (ref 0.0–0.5)
EOS%: 1.1 % (ref 0.0–7.0)
HCT: 36.7 % — ABNORMAL LOW (ref 38.4–49.9)
HEMOGLOBIN: 11.9 g/dL — AB (ref 13.0–17.1)
LYMPH%: 43.2 % (ref 14.0–49.0)
MCH: 32.1 pg (ref 27.2–33.4)
MCHC: 32.3 g/dL (ref 32.0–36.0)
MCV: 99.4 fL — ABNORMAL HIGH (ref 79.3–98.0)
MONO#: 0.4 10*3/uL (ref 0.1–0.9)
MONO%: 6.6 % (ref 0.0–14.0)
NEUT%: 48.9 % (ref 39.0–75.0)
NEUTROS ABS: 3 10*3/uL (ref 1.5–6.5)
Platelets: 129 10*3/uL — ABNORMAL LOW (ref 140–400)
RBC: 3.7 10*6/uL — AB (ref 4.20–5.82)
RDW: 13.3 % (ref 11.0–14.6)
WBC: 6.1 10*3/uL (ref 4.0–10.3)
lymph#: 2.6 10*3/uL (ref 0.9–3.3)

## 2014-12-23 NOTE — Progress Notes (Signed)
Hgb: 11.9. Patient aware. Orders to keep above 10. No aranesp given.

## 2015-01-20 ENCOUNTER — Ambulatory Visit: Payer: Medicare Other

## 2015-01-20 ENCOUNTER — Other Ambulatory Visit (HOSPITAL_BASED_OUTPATIENT_CLINIC_OR_DEPARTMENT_OTHER): Payer: Medicare Other

## 2015-01-20 DIAGNOSIS — N179 Acute kidney failure, unspecified: Secondary | ICD-10-CM

## 2015-01-20 DIAGNOSIS — N189 Chronic kidney disease, unspecified: Principal | ICD-10-CM

## 2015-01-20 DIAGNOSIS — D508 Other iron deficiency anemias: Secondary | ICD-10-CM

## 2015-01-20 DIAGNOSIS — D649 Anemia, unspecified: Secondary | ICD-10-CM | POA: Diagnosis not present

## 2015-01-20 LAB — CBC WITH DIFFERENTIAL/PLATELET
BASO%: 0.4 % (ref 0.0–2.0)
BASOS ABS: 0 10*3/uL (ref 0.0–0.1)
EOS%: 1.3 % (ref 0.0–7.0)
Eosinophils Absolute: 0.1 10*3/uL (ref 0.0–0.5)
HEMATOCRIT: 36.6 % — AB (ref 38.4–49.9)
HEMOGLOBIN: 11.8 g/dL — AB (ref 13.0–17.1)
LYMPH%: 32.3 % (ref 14.0–49.0)
MCH: 31.9 pg (ref 27.2–33.4)
MCHC: 32.3 g/dL (ref 32.0–36.0)
MCV: 98.7 fL — ABNORMAL HIGH (ref 79.3–98.0)
MONO#: 0.4 10*3/uL (ref 0.1–0.9)
MONO%: 6.7 % (ref 0.0–14.0)
NEUT#: 3.4 10*3/uL (ref 1.5–6.5)
NEUT%: 59.3 % (ref 39.0–75.0)
PLATELETS: 120 10*3/uL — AB (ref 140–400)
RBC: 3.7 10*6/uL — ABNORMAL LOW (ref 4.20–5.82)
RDW: 13 % (ref 11.0–14.6)
WBC: 5.8 10*3/uL (ref 4.0–10.3)
lymph#: 1.9 10*3/uL (ref 0.9–3.3)

## 2015-01-20 MED ORDER — DARBEPOETIN ALFA 300 MCG/0.6ML IJ SOSY: 300.0000 ug | PREFILLED_SYRINGE | Freq: Once | INTRAMUSCULAR | Status: DC

## 2015-02-01 ENCOUNTER — Other Ambulatory Visit: Payer: Self-pay | Admitting: Cardiology

## 2015-02-08 ENCOUNTER — Other Ambulatory Visit (HOSPITAL_COMMUNITY): Payer: Self-pay

## 2015-02-08 ENCOUNTER — Other Ambulatory Visit: Payer: Self-pay | Admitting: Cardiology

## 2015-02-08 MED ORDER — FUROSEMIDE 40 MG PO TABS: 80.0000 mg | ORAL_TABLET | Freq: Every day | ORAL | Status: DC

## 2015-02-09 ENCOUNTER — Other Ambulatory Visit (HOSPITAL_COMMUNITY): Payer: Self-pay | Admitting: *Deleted

## 2015-02-15 ENCOUNTER — Encounter: Payer: Self-pay | Admitting: Internal Medicine

## 2015-02-15 ENCOUNTER — Ambulatory Visit (INDEPENDENT_AMBULATORY_CARE_PROVIDER_SITE_OTHER): Payer: Medicare Other | Admitting: Internal Medicine

## 2015-02-15 VITALS — BP 159/60 | HR 61 | Temp 98.7°F | Ht 68.0 in | Wt 156.4 lb

## 2015-02-15 DIAGNOSIS — N183 Chronic kidney disease, stage 3 unspecified: Secondary | ICD-10-CM

## 2015-02-15 DIAGNOSIS — I10 Essential (primary) hypertension: Secondary | ICD-10-CM | POA: Diagnosis not present

## 2015-02-15 DIAGNOSIS — I2510 Atherosclerotic heart disease of native coronary artery without angina pectoris: Secondary | ICD-10-CM

## 2015-02-15 DIAGNOSIS — E118 Type 2 diabetes mellitus with unspecified complications: Secondary | ICD-10-CM | POA: Diagnosis not present

## 2015-02-15 DIAGNOSIS — I5022 Chronic systolic (congestive) heart failure: Secondary | ICD-10-CM | POA: Diagnosis not present

## 2015-02-15 DIAGNOSIS — K222 Esophageal obstruction: Secondary | ICD-10-CM | POA: Diagnosis not present

## 2015-02-15 NOTE — Progress Notes (Signed)
Patient ID: [name] [name], male   DOB: 1926-05-15, 81 y.o.   MRN: [id number]   Location:  [facility] [place] [facility]  Code Status: DNR Advanced Directive information Does patient have an advance directive?: Yes, Type of Advance Directive: Living will, Does patient want to make changes to advanced directive?: No - Patient declined  Allergies  Allergen Reactions  . Statins Other (See Comments)    Pain/weakness in legs    Chief Complaint  Patient presents with  . Medical Management of Chronic Issues    4 month follow-up, no recent labs (Not fasting)     HPI: Patient is a 81 y.o. white male seen in the office today for med mgt of chronic diseases.    Says he seems to be fine.    Has not been in hospital since July.  Has been here since his stones.    Has no concerns.  Walking is 100% better.  Walks quite a bit.  Had been having difficulty with flexing right leg at knee adequately when walking but better now.    No pain.  No chest pains, shortness of breath.    Next aranesp is the 12th.  He has not needed the aranesp to be given in ages.  Goal is above 10.  Mood is good.  Sleeps quite well.  Goes to bed 930, wakes up once and uses urinal at bedside.  On water pill.    Weights stable, bp at goal at home in 409W systolic consistently and glucose running in 110s-120s.    Does have coldness and tingling in the feet.  Have felt that way since 1945 when his son was 79 yo.  Had burnt both feet with spills bath water he had stepped in when hot water heater was getting fixed.    He follows with ophthalmology.  Need copy of last eye visit.  Review of Systems:  Review of Systems  Constitutional: Negative for fever, chills and malaise/fatigue.  HENT: Positive for hearing loss.   Eyes:       Glasses  Respiratory: Negative for shortness of breath.   Cardiovascular: Negative for chest pain and palpitations.  Gastrointestinal: Negative for abdominal pain,  diarrhea, constipation, blood in stool and melena.  Genitourinary: Negative for dysuria.  Musculoskeletal: Negative for falls.       Still limps on right leg since vein taken for cabg  Neurological: Negative for dizziness and weakness.  Endo/Heme/Allergies: Does not bruise/bleed easily.  Psychiatric/Behavioral: Positive for memory loss. Negative for depression. The patient is not nervous/anxious.      Past Medical History  Diagnosis Date  . History of colon cancer     s/p colon resection  . Aortic stenosis     a. s/p AVR with 76mm Edwards pericardial valve 02/07/12 - post-op course complicated by pleural effusion requring thoracentesis/leg cellulitis 03/2012.  Marland Kitchen CAD (coronary artery disease)     a. s/p NSTEMI 12/2011:  LHC - Ostial left main 20%, ostial LAD 50%, mid 60-70%, ostial D1 40% and mid 40%, D2 70%, ostial circumflex occluded, ostial RCA 80-90%, LVEDP was 42. b.  s/p CABG x 3 at time of AVR (LiMA-LAD, SVG-2nd daigonal, SVG-PDA) 02/07/12 (post-op course noted above).  . Ischemic cardiomyopathy   . Chronic systolic heart failure     a. TEE 01/2012: EF 25-30%, diffuse hypokinesis. b. Not on ACEI due to renal insufficiency.;  c. follow up  echo 08/06/12: EF 25%, mod diast dysfxn, AVR ok, mild MR,  mod LAE, mild RAE, mild to mod RV systolic dysfunction  . HLD (hyperlipidemia)   . GERD (gastroesophageal reflux disease)   . Chronic kidney disease   . Pleural effusion     a. post-operatively after AVR/CABG s/p thoracentesis 03/2012 yielding 1L serosanguinous fluid.  . Diabetes mellitus     borderline  . Blood transfusion     NO REACTION TO TRANSFUSION  . Cellulitis     a. RLE cellulitis 2 months post-operatively after AVR/CABG - serratia marcessans, tx with I&D/antibiotics  . Mitral regurgitation     Moderate by TEE 01/2012  . Flash pulmonary edema     Post-cath 12/2011, went into acute pulm edema requiring IV lasix and intubation  . Baker's cyst 07/23/12    Incidental finding of LE  venous dopplers  . Cataract     right eye, hx of  . HTN (hypertension)     primary, Dr. Jose Persia  . Myocardial infarction 12/2011  . CHF (congestive heart failure)   . Stroke 10/01    RIght Leg weakness  . Cancer '90's    Colon  . Anemia     Past Surgical History  Procedure Laterality Date  . Colon resection  1996  . Esophageal dilation    . Colonoscopy    . Cataract extraction      rt  . Cardiac catheterization      1.3.13  stopped breathing, put on ventilator for 5-6 days  . Tonsillectomy    . Aortic valve replacement  02/07/2012    Procedure: AORTIC VALVE REPLACEMENT (AVR);  Surgeon: Gaye Pollack, MD;  Location: [place];  Service: Open Heart Surgery;  Laterality: N/A;  . Coronary artery bypass graft  02/07/2012    Procedure: CORONARY ARTERY BYPASS GRAFTING (CABG);  Surgeon: Gaye Pollack, MD;  Location: [place];  Service: Open Heart Surgery;  Laterality: N/A;  CABG x three; using right leg greater saphenous vein harvested endoscopically  . Chest tube insertion  07/24/2012    Procedure: INSERTION PLEURAL DRAINAGE CATHETER;  Surgeon: Gaye Pollack, MD;  Location: [place];  Service: Thoracic;  Laterality: Left;  . Talc pleurodesis  08/30/2012    Procedure: TALC PLEURADESIS;  Surgeon: Gaye Pollack, MD;  Location: [place];  Service: Thoracic;  Laterality: Left;  INSERTION OF TALC VIA LEFT PLEURX  . Talc pleurodesis  09/12/2012    Procedure: TALC PLEURADESIS;  Surgeon: Gaye Pollack, MD;  Location: [place];  Service: Thoracic;  Laterality: Left;  . Removal of pleural drainage catheter  09/27/2012    Procedure: REMOVAL OF PLEURAL DRAINAGE CATHETER;  Surgeon: Gaye Pollack, MD;  Location: [place];  Service: Thoracic;  Laterality: Left;  Marland Kitchen Eye surgery  2009    cataract removed from right eye  . Esophagogastroduodenoscopy N/A 07/03/2014    Procedure: ESOPHAGOGASTRODUODENOSCOPY (EGD);  Surgeon: Arta Silence, MD;  Location: [facility] ENDOSCOPY;  Service: Endoscopy;  Laterality: N/A;  .  Esophagoscopy with dilitation N/A 07/07/2014    Procedure: ESOPHAGOSCOPY WITH DILITATION/SAVARY DILATOR;  Surgeon: Rozetta Nunnery, MD;  Location: [place];  Service: ENT;  Laterality: N/A;  . Ercp N/A 07/08/2014    Procedure: ENDOSCOPIC RETROGRADE CHOLANGIOPANCREATOGRAPHY (ERCP);  Surgeon: Missy Sabins, MD;  Location: [facility] ENDOSCOPY;  Service: Endoscopy;  Laterality: N/A;  . Left heart catheterization with coronary angiogram N/A 12/13/2011    Procedure: LEFT HEART CATHETERIZATION WITH CORONARY ANGIOGRAM;  Surgeon: Peter M Martinique, MD;  Location: [facility] CATH LAB;  Service: Cardiovascular;  Laterality:  N/A;    Social History:   reports that he has never smoked. He has never used smokeless tobacco. He reports that he does not drink alcohol or use illicit drugs.  Family History  Problem Relation Age of Onset  . Cancer Mother   . Heart attack Father   . Mental illness Brother   . Kidney failure Brother     Medications: Patient's Medications  New Prescriptions   No medications on file  Previous Medications   AMOXICILLIN (AMOXIL) 500 MG CAPSULE    Take 2,000 mg by mouth See admin instructions. Take 4 capsules (2000 mg) prior to dental appointment   ASPIRIN EC 325 MG TABLET    Take 325 mg by mouth daily.    CARVEDILOL (COREG) 25 MG TABLET    Take 1 tablet (25 mg total) by mouth 2 (two) times daily with a meal. <please make an appointment for future refills>   CHOLECALCIFEROL (VITAMIN D) 1000 UNITS TABLET    Take 1,000 Units by mouth daily with supper.    FUROSEMIDE (LASIX) 40 MG TABLET    Take 2 tablets (80 mg total) by mouth daily.   HYDRALAZINE (APRESOLINE) 25 MG TABLET    Take 1 tablet (25 mg total) by mouth 3 (three) times daily.   ISOSORBIDE MONONITRATE (IMDUR) 60 MG 24 HR TABLET    TAKE 1 TABLET BY MOUTH EVERY DAY   MULTIPLE VITAMIN (MULTIVITAMIN WITH MINERALS) TABS TABLET    Take 1 tablet by mouth daily.   NITROGLYCERIN (NITROSTAT) 0.4 MG SL TABLET    Place 0.4 mg under the tongue every 5  (five) minutes as needed for chest pain.   OMEPRAZOLE (PRILOSEC) 20 MG CAPSULE    Take 20 mg by mouth daily.    PRENATAL VIT-FE FUMARATE-FA (MULTIVITAMIN-PRENATAL) 27-0.8 MG TABS    Take 1 tablet by mouth daily with supper.    TAMSULOSIN (FLOMAX) 0.4 MG CAPS CAPSULE    TAKE 1 CAPSULE BY MOUTH ONCE DAILY AFTER SUPPER   TRAVOPROST, BAK FREE, (TRAVATAN) 0.004 % SOLN OPHTHALMIC SOLUTION    Place 1 drop into both eyes at bedtime.   Modified Medications   No medications on file  Discontinued Medications   FUROSEMIDE (LASIX) 40 MG TABLET    TAKE 2 TABLETS BY MOUTH ONCE DAILY   TAMSULOSIN (FLOMAX) 0.4 MG CAPS CAPSULE    Take 0.4 mg by mouth daily after supper. Take 30 minutes after eating   Physical Exam: Filed Vitals:   02/15/15 1448  BP: 159/60  Pulse: 61  Temp: 98.7 F (37.1 C)  TempSrc: Oral  Height: 5\' 8"  (1.727 m)  Weight: 156 lb 6.4 oz (70.943 kg)  SpO2: 97%  Physical Exam  Constitutional: He is oriented to person, place, and time.  Pleasant thin white male, nad  Cardiovascular: Normal rate, regular rhythm and normal heart sounds.   Pulmonary/Chest: Effort normal and breath sounds normal. No respiratory distress.  Abdominal: Soft. Bowel sounds are normal. He exhibits no distension and no mass. There is no tenderness.  Musculoskeletal:  Still with slight foot drop on right since CABG  Neurological: He is alert and oriented to person, place, and time.  Skin: Skin is warm and dry.  Has scaly raised excoriated place on left hand; follows with derm    Labs reviewed: Basic Metabolic Panel:  Recent Labs  07/04/14 0440 07/08/14 0546 07/09/14 0645  NA 139 137 139  K 3.9 3.9 4.2  CL 98 97 99  CO2 26 25 28  GLUCOSE 119* 120* 96  BUN 29* 29* 30*  CREATININE 2.28* 2.27* 2.31*  CALCIUM 8.9 8.9 8.9   Liver Function Tests:  Recent Labs  07/04/14 0440 07/08/14 0546 07/09/14 0645 07/16/14 1629  AST 38* 149* 65* 56*  ALT 69* 183* 122* 81*  ALKPHOS 302* 634* 590* 599*    BILITOT 1.1 2.7* 1.5* 0.9  PROT 6.2 6.1 6.3 6.8  ALBUMIN 2.5* 2.6* 2.7*  --     Recent Labs  06/28/14 2058 06/30/14 0520  LIPASE 34 11  CBC:  Recent Labs  11/25/14 1310 12/23/14 1305 01/20/15 1301  WBC 5.5 6.1 5.8  NEUTROABS 3.2 3.0 3.4  HGB 11.5* 11.9* 11.8*  HCT 35.0* 36.7* 36.6*  MCV 99.1* 99.4* 98.7*  PLT 126* 129* 120*   Lipid Panel:  Recent Labs  03/19/14 1345  CHOL 137  HDL 36*  LDLCALC 78  TRIG 113  CHOLHDL 3.8   Lab Results  Component Value Date   HGBA1C 6.6* 07/07/2013   Assessment/Plan 1. Chronic systolic heart failure - weight stable, no chest pains or dyspnea -cont asa 325mg , coreg, lasix, imdur, ntg - Comprehensive metabolic panel  2. Chronic kidney disease, stage III (moderate) -cont to avoid nsaids and other nephrotoxic agents, stable renal function -gets darbopoietin for hgb<10 through Dr. Alen Blew - Comprehensive metabolic panel  3. Esophageal stenosis -no recent symptoms  4. Essential hypertension, benign -bp at goal as he monitors it at home  5. Diabetes mellitus type 2, controlled, with complications - cont dietary management - Comprehensive metabolic panel - Hemoglobin A1c  6. Atherosclerosis of native coronary artery of native heart without angina pectoris -asymptomatic he says since his CABG -stable - Comprehensive metabolic panel  Labs/tests ordered:   Orders Placed This Encounter  Procedures  . Comprehensive metabolic panel  . Hemoglobin A1c    Next appt:  4 mos with MMSE (seems he is having difficulty with time perception and vaccine history)  Livia Tarr L. Yousra Ivens, D.O. [address] [place], [place] [address] Cell Phone (Mon-Fri 8am-5pm):  [phone] On Call:  [phone] & follow prompts after 5pm & weekends Office Phone:  [phone] Office Fax:  [phone]

## 2015-02-16 ENCOUNTER — Telehealth (HOSPITAL_COMMUNITY): Payer: Self-pay | Admitting: *Deleted

## 2015-02-16 LAB — COMPREHENSIVE METABOLIC PANEL
ALT: 11 IU/L (ref 0–44)
AST: 15 IU/L (ref 0–40)
Albumin/Globulin Ratio: 1.4 (ref 1.1–2.5)
Albumin: 4 g/dL (ref 3.5–4.7)
Alkaline Phosphatase: 66 IU/L (ref 39–117)
BUN/Creatinine Ratio: 18 (ref 10–22)
BUN: 39 mg/dL — ABNORMAL HIGH (ref 8–27)
Bilirubin Total: 0.4 mg/dL (ref 0.0–1.2)
CO2: 25 mmol/L (ref 18–29)
Calcium: 9.6 mg/dL (ref 8.6–10.2)
Chloride: 101 mmol/L (ref 97–108)
Creatinine, Ser: 2.18 mg/dL — ABNORMAL HIGH (ref 0.76–1.27)
GFR calc Af Amer: 30 mL/min/{1.73_m2} — ABNORMAL LOW (ref >59)
GFR calc non Af Amer: 26 mL/min/{1.73_m2} — ABNORMAL LOW (ref >59)
Globulin, Total: 2.8 g/dL (ref 1.5–4.5)
Glucose: 150 mg/dL — ABNORMAL HIGH (ref 65–99)
Potassium: 4.7 mmol/L (ref 3.5–5.2)
Sodium: 140 mmol/L (ref 134–144)
Total Protein: 6.8 g/dL (ref 6.0–8.5)

## 2015-02-16 LAB — HEMOGLOBIN A1C
Est. average glucose Bld gHb Est-mCnc: 143 mg/dL
Hgb A1c MFr Bld: 6.6 % — ABNORMAL HIGH (ref 4.8–5.6)

## 2015-02-17 ENCOUNTER — Telehealth: Payer: Self-pay | Admitting: Oncology

## 2015-02-17 ENCOUNTER — Other Ambulatory Visit (HOSPITAL_BASED_OUTPATIENT_CLINIC_OR_DEPARTMENT_OTHER): Payer: Medicare Other

## 2015-02-17 ENCOUNTER — Ambulatory Visit: Payer: Medicare Other

## 2015-02-17 ENCOUNTER — Other Ambulatory Visit (HOSPITAL_COMMUNITY): Payer: Self-pay | Admitting: *Deleted

## 2015-02-17 ENCOUNTER — Ambulatory Visit (HOSPITAL_BASED_OUTPATIENT_CLINIC_OR_DEPARTMENT_OTHER): Payer: Medicare Other | Admitting: Oncology

## 2015-02-17 VITALS — BP 146/66 | HR 51 | Temp 98.1°F | Resp 18 | Ht 68.0 in | Wt 155.0 lb

## 2015-02-17 DIAGNOSIS — D508 Other iron deficiency anemias: Secondary | ICD-10-CM

## 2015-02-17 DIAGNOSIS — N289 Disorder of kidney and ureter, unspecified: Secondary | ICD-10-CM

## 2015-02-17 DIAGNOSIS — D649 Anemia, unspecified: Secondary | ICD-10-CM

## 2015-02-17 DIAGNOSIS — N189 Chronic kidney disease, unspecified: Principal | ICD-10-CM

## 2015-02-17 DIAGNOSIS — I2510 Atherosclerotic heart disease of native coronary artery without angina pectoris: Secondary | ICD-10-CM

## 2015-02-17 DIAGNOSIS — N179 Acute kidney failure, unspecified: Secondary | ICD-10-CM

## 2015-02-17 DIAGNOSIS — I10 Essential (primary) hypertension: Secondary | ICD-10-CM

## 2015-02-17 DIAGNOSIS — D696 Thrombocytopenia, unspecified: Secondary | ICD-10-CM

## 2015-02-17 DIAGNOSIS — D631 Anemia in chronic kidney disease: Secondary | ICD-10-CM

## 2015-02-17 DIAGNOSIS — D638 Anemia in other chronic diseases classified elsewhere: Secondary | ICD-10-CM

## 2015-02-17 LAB — CBC WITH DIFFERENTIAL/PLATELET
BASO%: 0.2 % (ref 0.0–2.0)
Basophils Absolute: 0 10*3/uL (ref 0.0–0.1)
EOS ABS: 0.1 10*3/uL (ref 0.0–0.5)
EOS%: 1.7 % (ref 0.0–7.0)
HEMATOCRIT: 35.8 % — AB (ref 38.4–49.9)
HGB: 11.7 g/dL — ABNORMAL LOW (ref 13.0–17.1)
LYMPH#: 2.3 10*3/uL (ref 0.9–3.3)
LYMPH%: 34.8 % (ref 14.0–49.0)
MCH: 32.9 pg (ref 27.2–33.4)
MCHC: 32.7 g/dL (ref 32.0–36.0)
MCV: 100.6 fL — ABNORMAL HIGH (ref 79.3–98.0)
MONO#: 0.4 10*3/uL (ref 0.1–0.9)
MONO%: 6.4 % (ref 0.0–14.0)
NEUT%: 56.9 % (ref 39.0–75.0)
NEUTROS ABS: 3.7 10*3/uL (ref 1.5–6.5)
PLATELETS: 112 10*3/uL — AB (ref 140–400)
RBC: 3.56 10*6/uL — ABNORMAL LOW (ref 4.20–5.82)
RDW: 13.3 % (ref 11.0–14.6)
WBC: 6.5 10*3/uL (ref 4.0–10.3)

## 2015-02-17 MED ORDER — CARVEDILOL 25 MG PO TABS: 25.0000 mg | ORAL_TABLET | Freq: Two times a day (BID) | ORAL | Status: DC

## 2015-02-17 MED ORDER — DARBEPOETIN ALFA 300 MCG/0.6ML IJ SOSY: 300.0000 ug | PREFILLED_SYRINGE | Freq: Once | INTRAMUSCULAR | Status: DC

## 2015-02-17 NOTE — Progress Notes (Signed)
Hematology and Oncology Follow Up Visit  [name] [name] [id number] Aug 13, 1926 81 y.o. 02/17/2015 1:18 PM [name], [name], DOReed, [name] L, DO   Principle Diagnosis: 81 year old gentleman with anemia that is multifactorial likely due to chronic disease and renal insufficiency  Current therapy: Aranesp 300 mcg every 4 weeks for a hemoglobin less than 10. He have not received an Aranesp injection in the last 6 months.  Interim History:  [name] returns for routine followup with his wife. Since the last visit, he continues to do very well. He has been getting monthly CBC and his hemoglobin continued to be above 11 and have not required any growth factor support. He does not report any fatigue or tiredness. Has not reported any bleeding complications. He denies chest pain, shortness of breath, dyspnea. He has not noticed any bleeding. Appetite has improvied and weight is stable. Lower extremity edema has improved. He has not reported any recent illness or hospitalization. He is not reporting any shortness of breath or excessive fatigue. He continues to perform activities of daily living without any hindrance or decline. The remaining review of systems unremarkable. Medications: I have reviewed the patient's current medications.  Current Outpatient Prescriptions  Medication Sig Dispense Refill  . amoxicillin (AMOXIL) 500 MG capsule Take 2,000 mg by mouth See admin instructions. Take 4 capsules (2000 mg) prior to dental appointment    . aspirin EC 325 MG tablet Take 325 mg by mouth daily.     . carvedilol (COREG) 25 MG tablet Take 1 tablet (25 mg total) by mouth 2 (two) times daily with a meal. <please make an appointment for future refills> 180 tablet 0  . cholecalciferol (VITAMIN D) 1000 UNITS tablet Take 1,000 Units by mouth daily with supper.     . furosemide (LASIX) 40 MG tablet Take 2 tablets (80 mg total) by mouth daily. 180 tablet 1  . hydrALAZINE (APRESOLINE) 25 MG tablet Take 1 tablet (25  mg total) by mouth 3 (three) times daily. 270 tablet 2  . isosorbide mononitrate (IMDUR) 60 MG 24 hr tablet TAKE 1 TABLET BY MOUTH EVERY DAY 30 tablet 6  . Multiple Vitamin (MULTIVITAMIN WITH MINERALS) TABS tablet Take 1 tablet by mouth daily.    . nitroGLYCERIN (NITROSTAT) 0.4 MG SL tablet Place 0.4 mg under the tongue every 5 (five) minutes as needed for chest pain.    Marland Kitchen omeprazole (PRILOSEC) 20 MG capsule Take 20 mg by mouth daily.     . Prenatal Vit-Fe Fumarate-FA (MULTIVITAMIN-PRENATAL) 27-0.8 MG TABS Take 1 tablet by mouth daily with supper.     . tamsulosin (FLOMAX) 0.4 MG CAPS capsule TAKE 1 CAPSULE BY MOUTH ONCE DAILY AFTER SUPPER 30 capsule 5  . Travoprost, BAK Free, (TRAVATAN) 0.004 % SOLN ophthalmic solution Place 1 drop into both eyes at bedtime.     . [DISCONTINUED] metFORMIN (GLUCOPHAGE) 500 MG tablet Take 500-1,000 mg by mouth. 2 tabs in the am, 1 tab in the pm     No current facility-administered medications for this visit.    Allergies:  Allergies  Allergen Reactions  . Statins Other (See Comments)    Pain/weakness in legs    Past Medical History, Surgical history, Social history, and Family History were reviewed and updated.    Physical Exam: Blood pressure 146/66, pulse 51, temperature 98.1 F (36.7 C), temperature source Oral, resp. rate 18, height 5\' 8"  (1.727 m), weight 155 lb (70.308 kg), SpO2 97 %. ECOG: 1 General appearance:  Appeared younger than stated age.  NAD. Head: Normocephalic, without obvious abnormality Neck: no adenopathy Lymph nodes: Cervical, supraclavicular, and axillary nodes normal. Heart:regular rate and rhythm, S1, S2 normal, no murmur, click, rub or gallop Lung:chest clear, no wheezing, rales, normal symmetric air entry, no tachypnea, retractions or cyanosis Abdomen: soft, non-tender, without masses or organomegaly EXT:no erythema, induration, or nodules   Lab Results: Lab Results  Component Value Date   WBC 6.5 02/17/2015   HGB  11.7* 02/17/2015   HCT 35.8* 02/17/2015   MCV 100.6* 02/17/2015   PLT 112* 02/17/2015     Chemistry      Component Value Date/Time   NA 140 02/15/2015 1539   NA 139 07/09/2014 0645   NA 140 09/04/2012 1309   K 4.7 02/15/2015 1539   K 4.7 09/04/2012 1309   CL 101 02/15/2015 1539   CL 101 09/04/2012 1309   CO2 25 02/15/2015 1539   CO2 31* 09/04/2012 1309   BUN 39* 02/15/2015 1539   BUN 30* 07/09/2014 0645   BUN 41.0* 09/04/2012 1309   CREATININE 2.18* 02/15/2015 1539   CREATININE 2.21* 01/08/2013 1152   CREATININE 2.2* 09/04/2012 1309      Component Value Date/Time   CALCIUM 9.6 02/15/2015 1539   CALCIUM 9.3 09/04/2012 1309   ALKPHOS 66 02/15/2015 1539   ALKPHOS 73 09/04/2012 1309   AST 15 02/15/2015 1539   AST 18 09/04/2012 1309   ALT 11 02/15/2015 1539   ALT 21 09/04/2012 1309   BILITOT 0.4 02/15/2015 1539   BILITOT 0.9 07/16/2014 1629   BILITOT 0.40 09/04/2012 1309       Impression and Plan:  This is an 81 year old gentleman with the following issues:  1. Anemia, multifactorial. Likely due to chronic disease and renal insufficiency. Hemoglobin is 11.7 today. Aranesp will not be given today. He has not required any Aranesp recently and continues to have relatively stable on a hemoglobin level. I plan on repeating a CBC in 3 months and use Aranesp if his hemoglobin is less than 10. He will have a visit in 6 months and a repeat CBC at that time. If he continues to have stable hemoglobin like he is right now, he will require follow-up every 6 months.  2. Thrombocytopenia, mild. We will continue to watch this with future lab draws. He has no active bleeding.  3. Hypertension. Blood pressure is controlled with hydralazine, Coreg, and Lasix.  4. Coronary artery disease. He remains on aspirin, Coreg, and Imdur.  5. Followup. He will be evaluated for a clinical visit in 6 months with every 3 month laboratory testing and possible injection.     [name] Button,  MD 3/9/20161:18 PM

## 2015-02-17 NOTE — Telephone Encounter (Signed)
gv and printed appt sched and avs for pt for June and Aug

## 2015-03-26 DIAGNOSIS — H2512 Age-related nuclear cataract, left eye: Secondary | ICD-10-CM | POA: Diagnosis not present

## 2015-03-26 DIAGNOSIS — H52203 Unspecified astigmatism, bilateral: Secondary | ICD-10-CM | POA: Diagnosis not present

## 2015-04-04 ENCOUNTER — Other Ambulatory Visit: Payer: Self-pay | Admitting: Internal Medicine

## 2015-05-03 ENCOUNTER — Other Ambulatory Visit: Payer: Self-pay | Admitting: Internal Medicine

## 2015-05-08 ENCOUNTER — Other Ambulatory Visit (HOSPITAL_COMMUNITY): Payer: Self-pay | Admitting: Internal Medicine

## 2015-05-12 ENCOUNTER — Ambulatory Visit: Payer: Medicare Other

## 2015-05-12 ENCOUNTER — Other Ambulatory Visit (HOSPITAL_BASED_OUTPATIENT_CLINIC_OR_DEPARTMENT_OTHER): Payer: Medicare Other

## 2015-05-12 DIAGNOSIS — D508 Other iron deficiency anemias: Secondary | ICD-10-CM

## 2015-05-12 DIAGNOSIS — N189 Chronic kidney disease, unspecified: Principal | ICD-10-CM

## 2015-05-12 DIAGNOSIS — D649 Anemia, unspecified: Secondary | ICD-10-CM

## 2015-05-12 DIAGNOSIS — N179 Acute kidney failure, unspecified: Secondary | ICD-10-CM

## 2015-05-12 DIAGNOSIS — D6489 Other specified anemias: Secondary | ICD-10-CM

## 2015-05-12 LAB — CBC WITH DIFFERENTIAL/PLATELET
BASO%: 0.2 % (ref 0.0–2.0)
Basophils Absolute: 0 10*3/uL (ref 0.0–0.1)
EOS ABS: 0.1 10*3/uL (ref 0.0–0.5)
EOS%: 0.9 % (ref 0.0–7.0)
HCT: 36.2 % — ABNORMAL LOW (ref 38.4–49.9)
HGB: 11.9 g/dL — ABNORMAL LOW (ref 13.0–17.1)
LYMPH#: 2 10*3/uL (ref 0.9–3.3)
LYMPH%: 32.1 % (ref 14.0–49.0)
MCH: 32.5 pg (ref 27.2–33.4)
MCHC: 32.9 g/dL (ref 32.0–36.0)
MCV: 98.9 fL — AB (ref 79.3–98.0)
MONO#: 0.4 10*3/uL (ref 0.1–0.9)
MONO%: 6.4 % (ref 0.0–14.0)
NEUT%: 60.4 % (ref 39.0–75.0)
NEUTROS ABS: 3.8 10*3/uL (ref 1.5–6.5)
NRBC: 0 % (ref 0–0)
Platelets: 118 10*3/uL — ABNORMAL LOW (ref 140–400)
RBC: 3.66 10*6/uL — ABNORMAL LOW (ref 4.20–5.82)
RDW: 13.6 % (ref 11.0–14.6)
WBC: 6.4 10*3/uL (ref 4.0–10.3)

## 2015-05-12 MED ORDER — DARBEPOETIN ALFA 300 MCG/0.6ML IJ SOSY: 300.0000 ug | PREFILLED_SYRINGE | Freq: Once | INTRAMUSCULAR | Status: DC

## 2015-05-12 NOTE — Progress Notes (Signed)
Hemoglobin >11.0.  Aranesp not given

## 2015-06-17 ENCOUNTER — Ambulatory Visit (INDEPENDENT_AMBULATORY_CARE_PROVIDER_SITE_OTHER): Payer: Medicare Other | Admitting: Internal Medicine

## 2015-06-17 ENCOUNTER — Encounter: Payer: Self-pay | Admitting: Internal Medicine

## 2015-06-17 VITALS — BP 150/70 | HR 57 | Temp 98.2°F | Resp 20 | Ht 68.0 in | Wt 155.8 lb

## 2015-06-17 DIAGNOSIS — I10 Essential (primary) hypertension: Secondary | ICD-10-CM

## 2015-06-17 DIAGNOSIS — D519 Vitamin B12 deficiency anemia, unspecified: Secondary | ICD-10-CM

## 2015-06-17 DIAGNOSIS — N183 Chronic kidney disease, stage 3 unspecified: Secondary | ICD-10-CM

## 2015-06-17 DIAGNOSIS — E118 Type 2 diabetes mellitus with unspecified complications: Secondary | ICD-10-CM

## 2015-06-17 DIAGNOSIS — I5022 Chronic systolic (congestive) heart failure: Secondary | ICD-10-CM

## 2015-06-17 DIAGNOSIS — G3184 Mild cognitive impairment, so stated: Secondary | ICD-10-CM | POA: Diagnosis not present

## 2015-06-17 DIAGNOSIS — I2510 Atherosclerotic heart disease of native coronary artery without angina pectoris: Secondary | ICD-10-CM

## 2015-06-17 NOTE — Progress Notes (Signed)
Patient ID: [name] [name], male   DOB: 1926/03/13, 81 y.o.   MRN: [id number]   Location:  [facility]  Code Status: DNR Goals of Care: Advanced Directive information Does patient have an advance directive?: Yes, Type of Advance Directive: [place];Living will;Out of facility DNR (pink MOST or yellow form), Pre-existing out of facility DNR order (yellow form or pink MOST form): Yellow form placed in chart (order not valid for inpatient use), Does patient want to make changes to advanced directive?: No - Patient declined   Chief Complaint  Patient presents with  . Medical Management of Chronic Issues    4 month follow-up    HPI: Patient is a 81 y.o.  seen in the office today for 4 month f/u and medical mgt of chronic diseases.  He is doing quite well aside from some dyspnea on exertion.  He continues to do really well.  He is still doing his exercises throughout the day and reading the bible between.    Actually got 24/30 on his MMSE which is a decline.  He is still very functional--able to keep track of meds with pillbox, bps, glucoses, weights, as well as his own finances/bills, and does all of his adls.  He still drives, but only locally--no longer visits relatives far away.     Review of Systems:  Review of Systems  Constitutional: Negative for fever, chills and malaise/fatigue.  HENT: Negative for congestion.   Eyes: Negative for blurred vision.  Respiratory: Negative for shortness of breath.        Dyspnea on exertion  Cardiovascular: Negative for chest pain and palpitations.  Gastrointestinal: Negative for abdominal pain, constipation, blood in stool and melena.  Genitourinary: Positive for frequency. Negative for dysuria, urgency and hematuria.  Musculoskeletal: Negative for myalgias and falls.  Skin: Negative for itching and rash.  Neurological: Negative for dizziness, loss of consciousness and weakness.    Endo/Heme/Allergies: Bruises/bleeds easily.  Psychiatric/Behavioral: Positive for memory loss. Negative for depression.    Past Medical History  Diagnosis Date  . History of colon cancer     s/p colon resection  . Aortic stenosis     a. s/p AVR with 17mm Edwards pericardial valve 02/07/12 - post-op course complicated by pleural effusion requring thoracentesis/leg cellulitis 03/2012.  Marland Kitchen CAD (coronary artery disease)     a. s/p NSTEMI 12/2011:  LHC - Ostial left main 20%, ostial LAD 50%, mid 60-70%, ostial D1 40% and mid 40%, D2 70%, ostial circumflex occluded, ostial RCA 80-90%, LVEDP was 42. b.  s/p CABG x 3 at time of AVR (LiMA-LAD, SVG-2nd daigonal, SVG-PDA) 02/07/12 (post-op course noted above).  . Ischemic cardiomyopathy   . Chronic systolic heart failure     a. TEE 01/2012: EF 25-30%, diffuse hypokinesis. b. Not on ACEI due to renal insufficiency.;  c. follow up  echo 08/06/12: EF 25%, mod diast dysfxn, AVR ok, mild MR, mod LAE, mild RAE, mild to mod RV systolic dysfunction  . HLD (hyperlipidemia)   . GERD (gastroesophageal reflux disease)   . Chronic kidney disease   . Pleural effusion     a. post-operatively after AVR/CABG s/p thoracentesis 03/2012 yielding 1L serosanguinous fluid.  . Diabetes mellitus     borderline  . Blood transfusion     NO REACTION TO TRANSFUSION  . Cellulitis     a. RLE cellulitis 2 months post-operatively after AVR/CABG - serratia marcessans, tx with I&D/antibiotics  . Mitral regurgitation  Moderate by TEE 01/2012  . Flash pulmonary edema     Post-cath 12/2011, went into acute pulm edema requiring IV lasix and intubation  . Baker's cyst 07/23/12    Incidental finding of LE venous dopplers  . Cataract     right eye, hx of  . HTN (hypertension)     primary, Dr. Jose Persia  . Myocardial infarction 12/2011  . CHF (congestive heart failure)   . Stroke 10/01    RIght Leg weakness  . Cancer '90's    Colon  . Anemia     Past Surgical History   Procedure Laterality Date  . Colon resection  1996  . Esophageal dilation    . Colonoscopy    . Cataract extraction      rt  . Cardiac catheterization      1.3.13  stopped breathing, put on ventilator for 5-6 days  . Tonsillectomy    . Aortic valve replacement  02/07/2012    Procedure: AORTIC VALVE REPLACEMENT (AVR);  Surgeon: Gaye Pollack, MD;  Location: [place];  Service: Open Heart Surgery;  Laterality: N/A;  . Coronary artery bypass graft  02/07/2012    Procedure: CORONARY ARTERY BYPASS GRAFTING (CABG);  Surgeon: Gaye Pollack, MD;  Location: [place];  Service: Open Heart Surgery;  Laterality: N/A;  CABG x three; using right leg greater saphenous vein harvested endoscopically  . Chest tube insertion  07/24/2012    Procedure: INSERTION PLEURAL DRAINAGE CATHETER;  Surgeon: Gaye Pollack, MD;  Location: [place];  Service: Thoracic;  Laterality: Left;  . Talc pleurodesis  08/30/2012    Procedure: TALC PLEURADESIS;  Surgeon: Gaye Pollack, MD;  Location: [place];  Service: Thoracic;  Laterality: Left;  INSERTION OF TALC VIA LEFT PLEURX  . Talc pleurodesis  09/12/2012    Procedure: TALC PLEURADESIS;  Surgeon: Gaye Pollack, MD;  Location: [place];  Service: Thoracic;  Laterality: Left;  . Removal of pleural drainage catheter  09/27/2012    Procedure: REMOVAL OF PLEURAL DRAINAGE CATHETER;  Surgeon: Gaye Pollack, MD;  Location: [place];  Service: Thoracic;  Laterality: Left;  Marland Kitchen Eye surgery  2009    cataract removed from right eye  . Esophagogastroduodenoscopy N/A 07/03/2014    Procedure: ESOPHAGOGASTRODUODENOSCOPY (EGD);  Surgeon: Arta Silence, MD;  Location: [facility] ENDOSCOPY;  Service: Endoscopy;  Laterality: N/A;  . Esophagoscopy with dilitation N/A 07/07/2014    Procedure: ESOPHAGOSCOPY WITH DILITATION/SAVARY DILATOR;  Surgeon: Rozetta Nunnery, MD;  Location: [place];  Service: ENT;  Laterality: N/A;  . Ercp N/A 07/08/2014    Procedure: ENDOSCOPIC RETROGRADE CHOLANGIOPANCREATOGRAPHY (ERCP);   Surgeon: Missy Sabins, MD;  Location: [facility] ENDOSCOPY;  Service: Endoscopy;  Laterality: N/A;  . Left heart catheterization with coronary angiogram N/A 12/13/2011    Procedure: LEFT HEART CATHETERIZATION WITH CORONARY ANGIOGRAM;  Surgeon: Peter M Martinique, MD;  Location: [facility] CATH LAB;  Service: Cardiovascular;  Laterality: N/A;    Allergies  Allergen Reactions  . Statins Other (See Comments)    Pain/weakness in legs   Medications: Patient's Medications  New Prescriptions   No medications on file  Previous Medications   AMOXICILLIN (AMOXIL) 500 MG CAPSULE    Take 2,000 mg by mouth See admin instructions. Take 4 capsules (2000 mg) prior to dental appointment   ASPIRIN EC 325 MG TABLET    Take 325 mg by mouth daily.    CARVEDILOL (COREG) 25 MG TABLET    TAKE 1 TABLET BY MOUTH TWICE  A DAY WITH A MEAL   CHOLECALCIFEROL (VITAMIN D) 1000 UNITS TABLET    Take 1,000 Units by mouth daily with supper.    FUROSEMIDE (LASIX) 40 MG TABLET    Take 2 tablets (80 mg total) by mouth daily.   HYDRALAZINE (APRESOLINE) 25 MG TABLET    Take 1 tablet (25 mg total) by mouth 3 (three) times daily.   ISOSORBIDE MONONITRATE (IMDUR) 60 MG 24 HR TABLET    TAKE 1 TABLET BY MOUTH EVERY DAY   MULTIPLE VITAMIN (MULTIVITAMIN WITH MINERALS) TABS TABLET    Take 1 tablet by mouth daily.   NITROGLYCERIN (NITROSTAT) 0.4 MG SL TABLET    Place 0.4 mg under the tongue every 5 (five) minutes as needed for chest pain.   OMEPRAZOLE (PRILOSEC) 20 MG CAPSULE    Take 20 mg by mouth daily.    PRENATAL VIT-FE FUMARATE-FA (MULTIVITAMIN-PRENATAL) 27-0.8 MG TABS    Take 1 tablet by mouth daily with supper.    TAMSULOSIN (FLOMAX) 0.4 MG CAPS CAPSULE    TAKE 1 CAPSULE BY MOUTH ONCE DAILY AFTER SUPPER   TRAVOPROST, BAK FREE, (TRAVATAN) 0.004 % SOLN OPHTHALMIC SOLUTION    Place 1 drop into both eyes at bedtime.   Modified Medications   No medications on file  Discontinued Medications   No medications on file    Physical Exam: Filed Vitals:    06/17/15 1310  BP: 150/70  Pulse: 57  Temp: 98.2 F (36.8 C)  TempSrc: Oral  Resp: 20  Height: 5\' 8"  (1.727 m)  Weight: 155 lb 12.8 oz (70.67 kg)  SpO2: 97%   Physical Exam  Constitutional: He is oriented to person, place, and time. He appears well-developed and well-nourished.  Thin white male  Cardiovascular: Normal rate, regular rhythm and intact distal pulses.   Murmur heard. Pulmonary/Chest: Effort normal and breath sounds normal. He has no rales.  Abdominal: Soft. Bowel sounds are normal. He exhibits no distension. There is no tenderness.  Musculoskeletal: Normal range of motion.  Slow gait, steady  Neurological: He is alert and oriented to person, place, and time.  Skin: Skin is warm and dry.  Psychiatric: He has a normal mood and affect.    Labs reviewed: Basic Metabolic Panel:  Recent Labs  07/08/14 0546 07/09/14 0645 02/15/15 1539  NA 137 139 140  K 3.9 4.2 4.7  CL 97 99 101  CO2 25 28 25   GLUCOSE 120* 96 150*  BUN 29* 30* 39*  CREATININE 2.27* 2.31* 2.18*  CALCIUM 8.9 8.9 9.6   Liver Function Tests:  Recent Labs  07/04/14 0440 07/08/14 0546 07/09/14 0645 07/16/14 1629 02/15/15 1539  AST 38* 149* 65* 56* 15  ALT 69* 183* 122* 81* 11  ALKPHOS 302* 634* 590* 599* 66  BILITOT 1.1 2.7* 1.5* 0.9 0.4  PROT 6.2 6.1 6.3 6.8 6.8  ALBUMIN 2.5* 2.6* 2.7*  --   --     Recent Labs  06/28/14 2058 06/30/14 0520  LIPASE 34 11   No results for input(s): AMMONIA in the last 8760 hours. CBC:  Recent Labs  01/20/15 1301 02/17/15 1250 05/12/15 1308  WBC 5.8 6.5 6.4  NEUTROABS 3.4 3.7 3.8  HGB 11.8* 11.7* 11.9*  HCT 36.6* 35.8* 36.2*  MCV 98.7* 100.6* 98.9*  PLT 120* 112* 118*   Lab Results  Component Value Date   HGBA1C 6.6* 02/15/2015   Assessment/Plan 1. Mild cognitive impairment -24/30 on MMSE (not 25, did not interlock pentagons) -remains fully functional so  does not meet criteria for dementia -seems he is still safe  independently -will cont to monitor his function and check memory at least annually  2. Chronic systolic heart failure -doing well considering this and his aortic stenosis, weights and bps are stable -cont coreg - Lipid panel; Future  3. Chronic kidney disease, stage III (moderate) -f/u labs before next visit to reassess; avoid nsaids - Comprehensive metabolic panel; Future  4. Essential hypertension, benign -bps at home are at goal as reviewed today (he documents these), always high here due to white coat hypertension  5. Diabetes mellitus type 2, controlled, with complications - cont asa 325mg , diet, exercise - Hemoglobin A1c; Future  6. Atherosclerosis of native coronary artery of native heart without angina pectoris -cont current regimen, has not had chest pain even once since his cabg - Lipid panel; Future  7. Anemia due to vitamin B12 deficiency - f/u with hematology as planned next month (gets injections for anemia of chronic disease depending on hgb - CBC with Differential/Platelet; Future before next visit with me  Labs/tests ordered:   Orders Placed This Encounter  Procedures  . CBC with Differential/Platelet    Standing Status: Future     Number of Occurrences:      Standing Expiration Date: 12/18/2015  . Comprehensive metabolic panel    Standing Status: Future     Number of Occurrences:      Standing Expiration Date: 12/18/2015    Order Specific Question:  Has the patient fasted?    Answer:  Yes  . Hemoglobin A1c    Standing Status: Future     Number of Occurrences:      Standing Expiration Date: 12/18/2015  . Lipid panel    Standing Status: Future     Number of Occurrences:      Standing Expiration Date: 12/18/2015    Order Specific Question:  Has the patient fasted?    Answer:  Yes    Next appt:  3 mos, labs before  Inaaya Vellucci L. Hazael Olveda, D.O. [facility] [address], [place] [address] Cell Phone (Mon-Fri  8am-5pm):  [phone] On Call:  [phone] & follow prompts after 5pm & weekends Office Phone:  [phone] Office Fax:  [phone]

## 2015-06-17 NOTE — Progress Notes (Signed)
Failed clock test 

## 2015-07-12 ENCOUNTER — Other Ambulatory Visit: Payer: Self-pay | Admitting: Internal Medicine

## 2015-08-03 ENCOUNTER — Other Ambulatory Visit (HOSPITAL_COMMUNITY): Payer: Self-pay | Admitting: Internal Medicine

## 2015-08-04 DIAGNOSIS — K4090 Unilateral inguinal hernia, without obstruction or gangrene, not specified as recurrent: Secondary | ICD-10-CM | POA: Diagnosis not present

## 2015-08-11 ENCOUNTER — Encounter: Payer: Self-pay | Admitting: Physician Assistant

## 2015-08-11 ENCOUNTER — Ambulatory Visit (HOSPITAL_BASED_OUTPATIENT_CLINIC_OR_DEPARTMENT_OTHER): Payer: Medicare Other | Admitting: Physician Assistant

## 2015-08-11 ENCOUNTER — Other Ambulatory Visit (HOSPITAL_BASED_OUTPATIENT_CLINIC_OR_DEPARTMENT_OTHER): Payer: Medicare Other

## 2015-08-11 ENCOUNTER — Ambulatory Visit: Payer: Medicare Other

## 2015-08-11 VITALS — BP 143/56 | HR 58 | Temp 98.5°F | Resp 18 | Ht 68.0 in | Wt 157.1 lb

## 2015-08-11 DIAGNOSIS — N179 Acute kidney failure, unspecified: Secondary | ICD-10-CM

## 2015-08-11 DIAGNOSIS — D649 Anemia, unspecified: Secondary | ICD-10-CM | POA: Diagnosis not present

## 2015-08-11 DIAGNOSIS — N189 Chronic kidney disease, unspecified: Secondary | ICD-10-CM

## 2015-08-11 DIAGNOSIS — D696 Thrombocytopenia, unspecified: Secondary | ICD-10-CM

## 2015-08-11 DIAGNOSIS — I2510 Atherosclerotic heart disease of native coronary artery without angina pectoris: Secondary | ICD-10-CM

## 2015-08-11 DIAGNOSIS — K4090 Unilateral inguinal hernia, without obstruction or gangrene, not specified as recurrent: Secondary | ICD-10-CM

## 2015-08-11 DIAGNOSIS — I10 Essential (primary) hypertension: Secondary | ICD-10-CM | POA: Diagnosis not present

## 2015-08-11 DIAGNOSIS — D631 Anemia in chronic kidney disease: Secondary | ICD-10-CM

## 2015-08-11 DIAGNOSIS — D508 Other iron deficiency anemias: Secondary | ICD-10-CM

## 2015-08-11 LAB — CBC WITH DIFFERENTIAL/PLATELET
BASO%: 0.3 % (ref 0.0–2.0)
BASOS ABS: 0 10*3/uL (ref 0.0–0.1)
EOS ABS: 0.1 10*3/uL (ref 0.0–0.5)
EOS%: 1.4 % (ref 0.0–7.0)
HCT: 36.8 % — ABNORMAL LOW (ref 38.4–49.9)
HEMOGLOBIN: 12.2 g/dL — AB (ref 13.0–17.1)
LYMPH%: 36.1 % (ref 14.0–49.0)
MCH: 32.7 pg (ref 27.2–33.4)
MCHC: 33.2 g/dL (ref 32.0–36.0)
MCV: 98.4 fL — AB (ref 79.3–98.0)
MONO#: 0.4 10*3/uL (ref 0.1–0.9)
MONO%: 7.1 % (ref 0.0–14.0)
NEUT#: 3.4 10*3/uL (ref 1.5–6.5)
NEUT%: 55.1 % (ref 39.0–75.0)
Platelets: 135 10*3/uL — ABNORMAL LOW (ref 140–400)
RBC: 3.74 10*6/uL — ABNORMAL LOW (ref 4.20–5.82)
RDW: 13.6 % (ref 11.0–14.6)
WBC: 6.1 10*3/uL (ref 4.0–10.3)
lymph#: 2.2 10*3/uL (ref 0.9–3.3)

## 2015-08-11 MED ORDER — DARBEPOETIN ALFA 300 MCG/0.6ML IJ SOSY: 300.0000 ug | PREFILLED_SYRINGE | Freq: Once | INTRAMUSCULAR | Status: DC

## 2015-08-11 NOTE — Progress Notes (Signed)
Hematology and Oncology Follow Up Visit  [name] [name] [id number] [birth year] 80 y.o. 08/11/2015 3:57 PM [name], [name], DOReed, [name] L, DO   Principle Diagnosis: 81 year old gentleman with anemia that is multifactorial likely due to chronic disease and renal insufficiency  Current therapy: Aranesp 300 mcg every 4 weeks for a hemoglobin less than 10. He have not received an Aranesp injection in the last 6 months.  Interim History:  [name] [name] returns for routine followup with his wife. Since the last visit, he continues to do very well. He has been getting monthly CBC and his hemoglobin continued to be above 11 and have not required any growth factor support. He does not report any fatigue or tiredness. Has not reported any bleeding complications. He denies chest pain, shortness of breath, dyspnea. He has not noticed any bleeding. Appetite has improvied and weight is stable. Lower extremity edema continues to improve. He has not reported any recent illness or hospitalization. He does state that he has an appointment with Dr. Alvino Blood on August 20, 2015 regarding inguinal hernia repair surgery. He is not reporting any shortness of breath or excessive fatigue. He continues to perform activities of daily living without any hindrance or decline. The remaining review of systems unremarkable.  Medications: I have reviewed the patient's current medications.  Current Outpatient Prescriptions  Medication Sig Dispense Refill  . amoxicillin (AMOXIL) 500 MG capsule Take 2,000 mg by mouth See admin instructions. Take 4 capsules (2000 mg) prior to dental appointment    . aspirin EC 325 MG tablet Take 325 mg by mouth daily.     . carvedilol (COREG) 25 MG tablet TAKE 1 TABLET BY MOUTH TWICE A DAY WITH A MEAL 180 tablet 0  . cholecalciferol (VITAMIN D) 1000 UNITS tablet Take 1,000 Units by mouth daily with supper.     . furosemide (LASIX) 40 MG tablet Take 2 tablets (80 mg total) by mouth daily. 180 tablet  1  . hydrALAZINE (APRESOLINE) 25 MG tablet TAKE 1 TABLET BY MOUTH 3 TIMES A DAY 270 tablet 3  . isosorbide mononitrate (IMDUR) 60 MG 24 hr tablet TAKE 1 TABLET BY MOUTH EVERY DAY 30 tablet 6  . Multiple Vitamin (MULTIVITAMIN WITH MINERALS) TABS tablet Take 1 tablet by mouth daily.    . nitroGLYCERIN (NITROSTAT) 0.4 MG SL tablet Place 0.4 mg under the tongue every 5 (five) minutes as needed for chest pain.    Marland Kitchen omeprazole (PRILOSEC) 20 MG capsule Take 20 mg by mouth daily.     . Prenatal Vit-Fe Fumarate-FA (MULTIVITAMIN-PRENATAL) 27-0.8 MG TABS Take 1 tablet by mouth daily with supper.     . tamsulosin (FLOMAX) 0.4 MG CAPS capsule TAKE 1 CAPSULE BY MOUTH ONCE DAILY AFTER SUPPER 30 capsule 5  . Travoprost, BAK Free, (TRAVATAN) 0.004 % SOLN ophthalmic solution Place 1 drop into both eyes at bedtime.     . [DISCONTINUED] metFORMIN (GLUCOPHAGE) 500 MG tablet Take 500-1,000 mg by mouth. 2 tabs in the am, 1 tab in the pm     No current facility-administered medications for this visit.    Allergies:  Allergies  Allergen Reactions  . Statins Other (See Comments)    Pain/weakness in legs    Past Medical History, Surgical history, Social history, and Family History were reviewed and updated.    Physical Exam: Blood pressure 143/56, pulse 58, temperature 98.5 F (36.9 C), temperature source Oral, resp. rate 18, height 5\' 8"  (1.727 m), weight 157 lb 1.6 oz (71.26 kg), SpO2 97 %.  ECOG: 1 General appearance:  Appeared younger than stated age. NAD. Head: Normocephalic, without obvious abnormality Neck: no adenopathy Lymph nodes: Cervical, supraclavicular, and axillary nodes normal. Heart:regular rate and rhythm, S1, S2 normal, no murmur, click, rub or gallop Lung:chest clear, no wheezing, rales, normal symmetric air entry, no tachypnea, retractions or cyanosis Abdomen: soft, non-tender, without masses or organomegaly EXT:no erythema, induration, or nodules   Lab Results: Lab Results   Component Value Date   WBC 6.1 08/11/2015   HGB 12.2* 08/11/2015   HCT 36.8* 08/11/2015   MCV 98.4* 08/11/2015   PLT 135* 08/11/2015     Chemistry      Component Value Date/Time   NA 140 02/15/2015 1539   NA 139 07/09/2014 0645   NA 140 09/04/2012 1309   K 4.7 02/15/2015 1539   K 4.7 09/04/2012 1309   CL 101 02/15/2015 1539   CL 101 09/04/2012 1309   CO2 25 02/15/2015 1539   CO2 31* 09/04/2012 1309   BUN 39* 02/15/2015 1539   BUN 30* 07/09/2014 0645   BUN 41.0* 09/04/2012 1309   CREATININE 2.18* 02/15/2015 1539   CREATININE 2.21* 01/08/2013 1152   CREATININE 2.2* 09/04/2012 1309      Component Value Date/Time   CALCIUM 9.6 02/15/2015 1539   CALCIUM 9.3 09/04/2012 1309   ALKPHOS 66 02/15/2015 1539   ALKPHOS 73 09/04/2012 1309   AST 15 02/15/2015 1539   AST 18 09/04/2012 1309   ALT 11 02/15/2015 1539   ALT 21 09/04/2012 1309   BILITOT 0.4 02/15/2015 1539   BILITOT 0.9 07/16/2014 1629   BILITOT 0.40 09/04/2012 1309       Impression and Plan:  This is an 81 year old gentleman with the following issues:  1. Anemia, multifactorial. Likely due to chronic disease and renal insufficiency. Hemoglobin is 12.2 today. Aranesp will not be given today. He has not required any Aranesp recently and continues to have relatively stable on a hemoglobin level. The plan is repeating a CBC in 3 months and use Aranesp if his hemoglobin is less than 10. He will have a visit in 6 months and a repeat CBC at that time. If he continues to have stable hemoglobin like he is right now, he will require follow-up every 6 months.  2. Thrombocytopenia, mild. We will continue to watch this with future lab draws. He has no active bleeding.  3. Hypertension. Blood pressure is controlled with hydralazine, Coreg, and Lasix.  4. Coronary artery disease. He remains on aspirin, Coreg, and Imdur.  5. Inguinal hernia-patient will follow up with Dr. Alvino Blood as planned  5. Followup. He will be evaluated  for a clinical visit in 6 months with every 3 month laboratory testing and possible injection.     Carlton Adam, PA-C  8/31/20163:57 PM

## 2015-08-16 NOTE — Patient Instructions (Signed)
Continue labs and injection appointments as scheduled Keep appointment with Dr. Alvino Blood as scheduled regarding your inguinal hernia repair surgery Follow up in 6 months

## 2015-08-18 DIAGNOSIS — K4090 Unilateral inguinal hernia, without obstruction or gangrene, not specified as recurrent: Secondary | ICD-10-CM | POA: Diagnosis not present

## 2015-08-19 DIAGNOSIS — Z23 Encounter for immunization: Secondary | ICD-10-CM | POA: Diagnosis not present

## 2015-08-23 ENCOUNTER — Ambulatory Visit (INDEPENDENT_AMBULATORY_CARE_PROVIDER_SITE_OTHER): Payer: Medicare Other | Admitting: Internal Medicine

## 2015-08-23 ENCOUNTER — Encounter: Payer: Self-pay | Admitting: Internal Medicine

## 2015-08-23 VITALS — BP 130/72 | HR 61 | Temp 97.7°F | Resp 16 | Ht 66.5 in | Wt 156.6 lb

## 2015-08-23 DIAGNOSIS — N183 Chronic kidney disease, stage 3 unspecified: Secondary | ICD-10-CM | POA: Insufficient documentation

## 2015-08-23 DIAGNOSIS — Z01818 Encounter for other preprocedural examination: Secondary | ICD-10-CM

## 2015-08-23 DIAGNOSIS — Z954 Presence of other heart-valve replacement: Secondary | ICD-10-CM

## 2015-08-23 DIAGNOSIS — I5022 Chronic systolic (congestive) heart failure: Secondary | ICD-10-CM

## 2015-08-23 DIAGNOSIS — K4090 Unilateral inguinal hernia, without obstruction or gangrene, not specified as recurrent: Secondary | ICD-10-CM | POA: Insufficient documentation

## 2015-08-23 DIAGNOSIS — Z952 Presence of prosthetic heart valve: Secondary | ICD-10-CM

## 2015-08-23 DIAGNOSIS — I2510 Atherosclerotic heart disease of native coronary artery without angina pectoris: Secondary | ICD-10-CM | POA: Diagnosis not present

## 2015-08-23 DIAGNOSIS — Z7189 Other specified counseling: Secondary | ICD-10-CM | POA: Diagnosis not present

## 2015-08-23 DIAGNOSIS — I10 Essential (primary) hypertension: Secondary | ICD-10-CM | POA: Diagnosis not present

## 2015-08-23 DIAGNOSIS — E118 Type 2 diabetes mellitus with unspecified complications: Secondary | ICD-10-CM

## 2015-08-23 MED ORDER — TETANUS-DIPHTH-ACELL PERTUSSIS 5-2.5-18.5 LF-MCG/0.5 IM SUSP: 0.5000 mL | Freq: Once | INTRAMUSCULAR | Status: DC

## 2015-08-23 MED ORDER — AMOXICILLIN 500 MG PO CAPS: 2000.0000 mg | ORAL_CAPSULE | ORAL | Status: DC

## 2015-08-23 NOTE — Progress Notes (Signed)
Patient ID: [name] [name], male   DOB: Feb 28, 1926, 81 y.o.   MRN: [id number]   Location:  [facility]  Code Status: DNR Goals of Care: Advanced Directive information Does patient have an advance directive?: Yes, Type of Advance Directive: [place];Living will;Out of facility DNR (pink MOST or yellow form), Pre-existing out of facility DNR order (yellow form or pink MOST form): Pink MOST form placed in chart (order not valid for inpatient use);Yellow form placed in chart (order not valid for inpatient use), Does patient want to make changes to advanced directive?: No - Patient declined (but needs MOST) MOST was done today with pt and his wife:  DNR, limited additional interventions (this is of course outside of his upcoming surgery), IVF and abx to be determined if needed, NO feeding tube  Chief Complaint  Patient presents with  . Pre-op Exam    Surgical clearance for hernia (will be performed at [facility])   . Medication Management    Discuss Amoxicillin   . Immunizations    Already had flu vaccine at [employer], [employer] for Tdap     HPI: Patient is a 81 y.o. white male seen in the office today for preop exam--surgical clearance for right inguinal hernia.  He has a h/o aortic valve replacement in 2013 for aortic stenosis, CAD s/p MI in 2013 s/p cath and cabg, chronic systolic chf which has been well controlled, DMII diet and exercise controlled, htn, hyperlipidemia both well controlled, prior colon ca w/p resection, moderate mitral regurgitation, prior stroke s/p CABG with right LE weakness ongoing.  He exercises by walking on a daily basis and eats a balanced healthy diet.  Recently, he's had some increased cognitive impairment.  He is doing very well and denies any chest pain, shortness of breath, nausea, diaphoresis, new weakness or numbness.  He has some chronic dyspnea related to his CHF as in when he walks into the office--this has  been the same for the past 2-3 years.    His right inguinal hernia has been progressively worsening--growing larger, sometimes painful and not reducing like it had.  He also feels like it interferes with his walking b/c it's become so large.    About 20 minutes were spent today reviewing his goals of care.  MOST form was completed as above and copy to be scanned into his EPIC record.    Review of Systems:  Review of Systems  Constitutional: Negative for fever, chills and malaise/fatigue.  HENT: Positive for hearing loss. Negative for congestion.        Hearing aides  Eyes: Negative for blurred vision.       Glasses  Respiratory: Negative for shortness of breath.        Chronic dyspnea on exertion, but he does not notice it anymore  Cardiovascular: Negative for chest pain, palpitations and leg swelling.  Gastrointestinal: Negative for abdominal pain, diarrhea, constipation, blood in stool and melena.       Large right inguinal hernia  Genitourinary: Positive for frequency. Negative for dysuria, urgency and hematuria.  Musculoskeletal: Negative for falls.  Neurological: Negative for dizziness, loss of consciousness and weakness.       Chronic right leg weakness s/p stroke  Endo/Heme/Allergies: Bruises/bleeds easily.  Psychiatric/Behavioral: Positive for memory loss. Negative for depression.    Past Medical History  Diagnosis Date  . History of colon cancer     s/p colon resection  . Aortic stenosis  a. s/p AVR with 64mm Edwards pericardial valve 02/07/12 - post-op course complicated by pleural effusion requring thoracentesis/leg cellulitis 03/2012.  Marland Kitchen CAD (coronary artery disease)     a. s/p NSTEMI 12/2011:  LHC - Ostial left main 20%, ostial LAD 50%, mid 60-70%, ostial D1 40% and mid 40%, D2 70%, ostial circumflex occluded, ostial RCA 80-90%, LVEDP was 42. b.  s/p CABG x 3 at time of AVR (LiMA-LAD, SVG-2nd daigonal, SVG-PDA) 02/07/12 (post-op course noted above).  . Ischemic  cardiomyopathy   . Chronic systolic heart failure     a. TEE 01/2012: EF 25-30%, diffuse hypokinesis. b. Not on ACEI due to renal insufficiency.;  c. follow up  echo 08/06/12: EF 25%, mod diast dysfxn, AVR ok, mild MR, mod LAE, mild RAE, mild to mod RV systolic dysfunction  . HLD (hyperlipidemia)   . GERD (gastroesophageal reflux disease)   . Chronic kidney disease   . Pleural effusion     a. post-operatively after AVR/CABG s/p thoracentesis 03/2012 yielding 1L serosanguinous fluid.  . Diabetes mellitus     borderline  . Blood transfusion     NO REACTION TO TRANSFUSION  . Cellulitis     a. RLE cellulitis 2 months post-operatively after AVR/CABG - serratia marcessans, tx with I&D/antibiotics  . Mitral regurgitation     Moderate by TEE 01/2012  . Flash pulmonary edema     Post-cath 12/2011, went into acute pulm edema requiring IV lasix and intubation  . Baker's cyst 07/23/12    Incidental finding of LE venous dopplers  . Cataract     right eye, hx of  . HTN (hypertension)     primary, Dr. Jose Persia  . Myocardial infarction 12/2011  . CHF (congestive heart failure)   . Stroke 10/01    RIght Leg weakness  . Cancer '90's    Colon  . Anemia     Past Surgical History  Procedure Laterality Date  . Colon resection  1996  . Esophageal dilation    . Colonoscopy    . Cataract extraction      rt  . Cardiac catheterization      1.3.13  stopped breathing, put on ventilator for 5-6 days  . Tonsillectomy    . Aortic valve replacement  02/07/2012    Procedure: AORTIC VALVE REPLACEMENT (AVR);  Surgeon: Gaye Pollack, MD;  Location: [place];  Service: Open Heart Surgery;  Laterality: N/A;  . Coronary artery bypass graft  02/07/2012    Procedure: CORONARY ARTERY BYPASS GRAFTING (CABG);  Surgeon: Gaye Pollack, MD;  Location: [place];  Service: Open Heart Surgery;  Laterality: N/A;  CABG x three; using right leg greater saphenous vein harvested endoscopically  . Chest tube insertion  07/24/2012     Procedure: INSERTION PLEURAL DRAINAGE CATHETER;  Surgeon: Gaye Pollack, MD;  Location: [place];  Service: Thoracic;  Laterality: Left;  . Talc pleurodesis  08/30/2012    Procedure: TALC PLEURADESIS;  Surgeon: Gaye Pollack, MD;  Location: [place];  Service: Thoracic;  Laterality: Left;  INSERTION OF TALC VIA LEFT PLEURX  . Talc pleurodesis  09/12/2012    Procedure: TALC PLEURADESIS;  Surgeon: Gaye Pollack, MD;  Location: [place];  Service: Thoracic;  Laterality: Left;  . Removal of pleural drainage catheter  09/27/2012    Procedure: REMOVAL OF PLEURAL DRAINAGE CATHETER;  Surgeon: Gaye Pollack, MD;  Location: [place];  Service: Thoracic;  Laterality: Left;  Marland Kitchen Eye surgery  2009  cataract removed from right eye  . Esophagogastroduodenoscopy N/A 07/03/2014    Procedure: ESOPHAGOGASTRODUODENOSCOPY (EGD);  Surgeon: Arta Silence, MD;  Location: [facility] ENDOSCOPY;  Service: Endoscopy;  Laterality: N/A;  . Esophagoscopy with dilitation N/A 07/07/2014    Procedure: ESOPHAGOSCOPY WITH DILITATION/SAVARY DILATOR;  Surgeon: Rozetta Nunnery, MD;  Location: [place];  Service: ENT;  Laterality: N/A;  . Ercp N/A 07/08/2014    Procedure: ENDOSCOPIC RETROGRADE CHOLANGIOPANCREATOGRAPHY (ERCP);  Surgeon: Missy Sabins, MD;  Location: [facility] ENDOSCOPY;  Service: Endoscopy;  Laterality: N/A;  . Left heart catheterization with coronary angiogram N/A 12/13/2011    Procedure: LEFT HEART CATHETERIZATION WITH CORONARY ANGIOGRAM;  Surgeon: Peter M Martinique, MD;  Location: [facility] CATH LAB;  Service: Cardiovascular;  Laterality: N/A;    Allergies  Allergen Reactions  . Statins Other (See Comments)    Pain/weakness in legs   Medications: Patient's Medications  New Prescriptions   No medications on file  Previous Medications   ASPIRIN EC 325 MG TABLET    Take 325 mg by mouth daily.    CARVEDILOL (COREG) 25 MG TABLET    TAKE 1 TABLET BY MOUTH TWICE A DAY WITH A MEAL   CHOLECALCIFEROL (VITAMIN D) 1000 UNITS TABLET    Take 1,000 Units by  mouth daily with supper.    FUROSEMIDE (LASIX) 40 MG TABLET    Take 2 tablets (80 mg total) by mouth daily.   HYDRALAZINE (APRESOLINE) 25 MG TABLET    TAKE 1 TABLET BY MOUTH 3 TIMES A DAY   ISOSORBIDE MONONITRATE (IMDUR) 60 MG 24 HR TABLET    TAKE 1 TABLET BY MOUTH EVERY DAY   MULTIPLE VITAMIN (MULTIVITAMIN WITH MINERALS) TABS TABLET    Take 1 tablet by mouth daily.   NITROGLYCERIN (NITROSTAT) 0.4 MG SL TABLET    Place 0.4 mg under the tongue every 5 (five) minutes as needed for chest pain.   OMEPRAZOLE (PRILOSEC) 20 MG CAPSULE    Take 20 mg by mouth daily.    PRENATAL VIT-FE FUMARATE-FA (MULTIVITAMIN-PRENATAL) 27-0.8 MG TABS    Take 1 tablet by mouth daily with supper.    TAMSULOSIN (FLOMAX) 0.4 MG CAPS CAPSULE    TAKE 1 CAPSULE BY MOUTH ONCE DAILY AFTER SUPPER   TRAVOPROST, BAK FREE, (TRAVATAN) 0.004 % SOLN OPHTHALMIC SOLUTION    Place 1 drop into both eyes at bedtime.   Modified Medications   Modified Medication Previous Medication   AMOXICILLIN (AMOXIL) 500 MG CAPSULE amoxicillin (AMOXIL) 500 MG capsule      Take 4 capsules (2,000 mg total) by mouth See admin instructions. Take 4 capsules (2000 mg) prior to surgery or dental appointments    Take 2,000 mg by mouth See admin instructions. Take 4 capsules (2000 mg) prior to dental appointment   TDAP (BOOSTRIX) 5-2.5-18.5 LF-MCG/0.5 INJECTION Tdap (BOOSTRIX) 5-2.5-18.5 LF-MCG/0.5 injection      Inject 0.5 mLs into the muscle once.    Inject 0.5 mLs into the muscle once.  Discontinued Medications   No medications on file    Physical Exam: Filed Vitals:   08/23/15 0858  BP: 130/72  Pulse: 61  Temp: 97.7 F (36.5 C)  TempSrc: Oral  Resp: 16  Height: 5' 6.5" (1.689 m)  Weight: 156 lb 9.6 oz (71.033 kg)  SpO2: 96%   Physical Exam  Constitutional: He is oriented to person, place, and time. He appears well-nourished. No distress.  Thin white male  HENT:  Head: Normocephalic and atraumatic.  HOH, hearing aides  Eyes: EOM  are normal.  Pupils are equal, round, and reactive to light.  glasses  Cardiovascular: Normal rate, regular rhythm and intact distal pulses.   Murmur heard. Systolic most audible in apical area  Pulmonary/Chest: Effort normal and breath sounds normal.  Abdominal: Soft. Bowel sounds are normal. He exhibits no distension. There is no tenderness. A hernia is present.  Large right inguinal hernia  Musculoskeletal: Normal range of motion. He exhibits no edema or tenderness.  RLE 4+/5, walks with limp  Neurological: He is alert and oriented to person, place, and time. No cranial nerve deficit.  Skin: Skin is warm and dry.  Psychiatric: He has a normal mood and affect. His behavior is normal.    Labs reviewed: Basic Metabolic Panel:  Recent Labs  02/15/15 1539  NA 140  K 4.7  CL 101  CO2 25  GLUCOSE 150*  BUN 39*  CREATININE 2.18*  CALCIUM 9.6   Liver Function Tests:  Recent Labs  02/15/15 1539  AST 15  ALT 11  ALKPHOS 66  BILITOT 0.4  PROT 6.8   No results for input(s): LIPASE, AMYLASE in the last 8760 hours. No results for input(s): AMMONIA in the last 8760 hours. CBC:  Recent Labs  02/17/15 1250 05/12/15 1308 08/11/15 1326  WBC 6.5 6.4 6.1  NEUTROABS 3.7 3.8 3.4  HGB 11.7* 11.9* 12.2*  HCT 35.8* 36.2* 36.8*  MCV 100.6* 98.9* 98.4*  PLT 112* 118* 135*   Lipid Panel: No results for input(s): CHOL, HDL, LDLCALC, TRIG, CHOLHDL, LDLDIRECT in the last 8760 hours. Lab Results  Component Value Date   HGBA1C 6.6* 02/15/2015    Procedures since last visit: EKG today:  Normal sinus rhthym at 56 bpm, left anterior fascicular block, LVH with repolarization abnormality; no acute ischemia or infarct and stable vs June 2014  Assessment/Plan 1. Pre-op exam - pt with extensive cardiac history, but stable now for 4 years since his MI with CABG and AVR that was complicated by stroke -has chronic systolic chf stable -EKG today unremarkable as above -goals of care reviewed with him  and his wife (MOST) -had flu shot -pt aware of risks of inguinal hernia repair at his age and with his medical history -will be using asa 81 mg in place of 325mg  due to his AVR in perioperative period as per surgery recs -also will take 2000mg  amoxicillin before his surgery - EKG 12-Lead  2. Right inguinal hernia -is for surgical repair under general anesthesia  -bothers him enough to get this done despite risks as reviewed with him and his wife  3. Diabetes mellitus type 2, controlled, with complications -under good control with diet and exercise - Lab Results  Component Value Date   HGBA1C 6.6* 02/15/2015   4. Atherosclerosis of native coronary artery of native heart without angina pectoris -stable since CABG without any angina -cont current medication regimen  5. Essential hypertension, benign -bp well controlled on his current regimen--will continue  6. Chronic systolic heart failure -does have dyspnea on exertion but he's comfortably unaware of it -no recent exacerbations with stable weight -cont current regimen  7. Chronic kidney disease, stage III (moderate) -has also been stable, avoid nsaids and other nephrotoxic agents  8. Advanced care planning/counseling discussion -20 mins spent completing MOST form and discussing goals of care today  9. Status post aortic valve replacement -will treat with amoxicillin 2g before surgery - still with moderate mitral regurgitation  Labs/tests ordered:  EKG today; will get labs at his preop  anesthesia visit so will not duplicate  Next appt:  Keep October appt assuming he is able to come in here at that point--otherwise his wife will call to reschedule  [place]. Vivan Agostino, D.O. [facility] [address], [place] [address] Cell Phone (Mon-Fri 8am-5pm):  [phone] On Call:  [phone] & follow prompts after 5pm & weekends Office Phone:  [phone] Office Fax:   [phone]

## 2015-08-26 ENCOUNTER — Telehealth: Payer: Self-pay | Admitting: *Deleted

## 2015-08-26 NOTE — Telephone Encounter (Signed)
Patient wife, Angelita Ingles called and left message on voicemail and stated they were confused. They took the Rx Dr. Mariea Clonts gave them and they thought it was for an antibiotic and it was for a Tetanus. She was wanting to know about the antibiotic that was suppose to be prescribed.  I tried calling patient and LMOM to return call.

## 2015-09-06 ENCOUNTER — Other Ambulatory Visit: Payer: Self-pay

## 2015-09-09 ENCOUNTER — Ambulatory Visit: Payer: Self-pay | Admitting: General Surgery

## 2015-09-09 NOTE — H&P (Signed)
e Heart Failure Diabetes Mellitus Enlarged Prostate Gastroesophageal Reflux Disease High blood pressure Inguinal Hernia Kidney Stone Myocardial infarction Other disease, cancer, significant illness  Past Surgical History Elbert Ewings, CMA; 08/18/2015 2:48 PM) Cataract Surgery Right. Colon Removal - Partial Coronary Artery Bypass Graft Oral Surgery Tonsillectomy Valve Replacement Vasectomy  Diagnostic Studies History Elbert Ewings, CMA; 08/18/2015 2:48 PM) Colonoscopy 5-10 years ago  Allergies Elbert Ewings, CMA; 08/18/2015 2:48 PM) Statins Support *DIETARY PRODUCTS/DIETARY MANAGEMENT PRODUCTS*  Medication History Elbert Ewings, CMA; 08/18/2015 2:50 PM) Carvedilol (25MG  Tablet, Oral) Active. Furosemide (40MG  Tablet, Oral) Active. HydrALAZINE HCl (25MG  Tablet, Oral) Active. Isosorbide Mononitrate ER (60MG  Tablet ER 24HR, Oral) Active. Nitrostat (0.4MG  Tab Sublingual, Sublingual) Active. Tamsulosin HCl (0.4MG  Capsule, Oral) Active. Travatan Z (0.004% Solution, Ophthalmic) Active. Travoprost (BAK Free) (0.004% Solution, Ophthalmic) Active. Aspirin (81MG  Tablet, Oral) Active. Omeprazole (20MG  Tablet DR, Oral) Active. Medications Reconciled  Social History Elbert Ewings, [place]; 08/18/2015 2:48 PM) Caffeine use Coffee. No alcohol use No drug use Tobacco use Never smoker.  Family History Elbert Ewings, [place]; 08/18/2015 2:48 PM) Alcohol Abuse Father. Arthritis Daughter. Cancer Mother. Heart Disease Father, Sister. Hypertension Mother, Sister.  Review of Systems Elbert Ewings CMA; 08/18/2015 2:48 PM) General Not Present- Appetite Loss, Chills, Fatigue, Fever, Night Sweats, Weight Gain and Weight Loss. Skin Not Present- Change in Wart/Mole, Dryness, Hives, Jaundice, New Lesions, Non-Healing Wounds, Rash and Ulcer. HEENT Present- Wears glasses/contact lenses. Not Present- Earache, Hearing Loss, Hoarseness, Nose Bleed, Oral Ulcers, Ringing in the Ears,  Seasonal Allergies, Sinus Pain, Sore Throat, Visual Disturbances and Yellow Eyes. Respiratory Not Present- Bloody sputum, Chronic Cough, Difficulty Breathing, Snoring and Wheezing. Breast Not Present- Breast Mass, Breast Pain, Nipple Discharge and Skin Changes. Cardiovascular Present- Shortness of Breath. Not Present- Chest Pain, Difficulty Breathing Lying Down, Leg Cramps, Palpitations, Rapid Heart Rate and Swelling of Extremities. Gastrointestinal Present- Difficulty Swallowing. Not Present- Abdominal Pain, Bloating, Bloody Stool, Change in Bowel Habits, Chronic diarrhea, Constipation, Excessive gas, Gets full quickly at meals, Hemorrhoids, Indigestion, Nausea, Rectal Pain and Vomiting. Male Genitourinary Present- Frequency and Nocturia. Not Present- Blood in Urine, Change in Urinary Stream, Impotence, Painful Urination, Urgency and Urine Leakage. Musculoskeletal Not Present- Back Pain, Joint Pain, Joint Stiffness, Muscle Pain, Muscle Weakness and Swelling of Extremities. Neurological Not Present- Decreased Memory, Fainting, Headaches, Numbness, Seizures, Tingling, Tremor, Trouble walking and Weakness. Psychiatric Not Present- Anxiety, Bipolar, Change in Sleep Pattern, Depression, Fearful and Frequent crying. Endocrine Not Present- Cold Intolerance, Excessive Hunger, Hair Changes, Heat Intolerance, Hot flashes and New Diabetes. Hematology Present- Easy Bruising. Not Present- Excessive bleeding, Gland problems, HIV and Persistent Infections.   Vitals Elbert Ewings CMA; 08/18/2015 2:50 PM) 08/18/2015 2:50 PM Weight: 157 lb Height: 68in Body Surface Area: 1.85 m Body Mass Index: 23.87 kg/m Temp.: 97.59F(Temporal)  Pulse: 70 (Regular)  BP: 124/64 (Sitting, Left Arm, Standard)    Physical Exam Geoffery Spruce MD; 08/18/2015 3:15 PM) General Mental Status-Alert. General Appearance-Cooperative. Orientation-Oriented X4. Posture-Normal posture.  Integumentary Global  Assessment Upon inspection and palpation of skin surfaces of the - Head/Face: no rashes, ulcers, lesions or evidence of photo damage. No palpable nodules or masses and Neck: no visible lesions or palpable masses.  Head and Neck Head-normocephalic, atraumatic with no lesions or palpable masses. Face Global Assessment - atraumatic. Thyroid Gland Characteristics - normal size and consistency.  Eye Eyeball - Bilateral-Extraocular movements intact. Sclera/Conjunctiva - Bilateral-No scleral icterus, No Discharge.  ENMT Nose and Sinuses External Inspection of the Nose - no deformities observed, no swelling present.  Chest and Lung Exam Palpation Palpation of the chest reveals - Non-tender. Auscultation Breath sounds - Normal.  Cardiovascular Auscultation Rhythm - Regular. Heart Sounds - S1 WNL and S2 WNL. Carotid arteries - No Carotid bruit.  Abdomen Inspection Inspection of the abdomen reveals - No Visible peristalsis, No Abnormal pulsations and No Paradoxical movements. Hernias - Inguinal hernia - Right - Reducible(large right inguinal hernia. Defect > 3cm. While reducing multiple bowel sounds heard.). Palpation/Percussion Palpation and Percussion of the abdomen reveal - Soft, Non Tender, No Rebound tenderness, No Rigidity (guarding), No hepatosplenomegaly and No Palpable abdominal masses.  Peripheral Vascular Upper Extremity Palpation - Pulses bilaterally normal. Lower Extremity Palpation - Edema - Bilateral - No edema.  Neurologic Neurologic evaluation reveals -normal sensation and normal coordination.  Neuropsychiatric Mental status exam performed with findings of-able to articulate well with normal speech/language, rate, volume and coherence and thought content normal with ability to perform basic computations and apply abstract reasoning.  Musculoskeletal Normal Exam - Bilateral-Upper Extremity Strength Normal and Lower Extremity Strength  Normal.    Assessment & Plan Geoffery Spruce MD; 08/18/2015 3:12 PM) RIGHT INGUINAL HERNIA (K40.90) Impression: 81 yo man with long presence right inguinal hernia. Currently filled with intestines, reducible on exam, symptomatic with pain and discomfort. He is fairly healthy after CABG in 2013 he is able to walk 1/4 mile without stopping. We discussed options for repair and agreed to proceed with open right inguinal repair with mesh after appropriate optimization. Current Plans  Pt Education - Pamphlet Given - Hernia Surgery: discussed with patient and provided information.   The anatomy & physiology of the abdominal wall and pelvic floor was discussed. The pathophysiology of hernias in the inguinal and pelvic region was discussed. Natural history risks such as progressive enlargement, pain, incarceration, and strangulation was discussed. Contributors to complications such as smoking, obesity, diabetes, prior surgery, etc were discussed.  I feel the risks of no intervention will lead to serious problems that outweigh the operative risks; therefore, I recommended surgery to reduce and repair the hernia. I explained laparoscopic techniques with possible need for an open approach. I noted usual use of mesh to patch and/or buttress hernia repair  Risks such as bleeding, infection, abscess, need for further treatment, heart attack, death, and other risks were discussed. I noted a good likelihood this will help address the problem. Goals of post-operative recovery were discussed as well. Possibility that this will not correct all symptoms was explained. I stressed the importance of low-impact activity, aggressive pain control, avoiding constipation, & not pushing through pain to minimize risk of post-operative chronic pain or injury. Possibility of reherniation was discussed. We will work to minimize complications.  An educational handout further explaining the pathology & treatment options was given as  well. Questions were answered. The patient expresses understanding & wishes to proceed with surgery.   I recommended obtaining preoperative medical clearance. I am concerned about the health of the patient and the ability to tolerate the operation. Therefore, we will request clearance by medicine to better assess operative risk & see if a reevaluation, further workup, etc is needed. Also recommendations on how medications should be managed/held/restarted after surgery. -Due to the full dose aspirin and his increased bruising I would like to switch him to 81mg  ASA daily 2 weeks prior to procedure to decrease risk of hematoma formation as once reduced he will have a large empty space in his scrotum.

## 2015-09-10 ENCOUNTER — Encounter (HOSPITAL_COMMUNITY)
Admission: RE | Admit: 2015-09-10 | Discharge: 2015-09-10 | Disposition: A | Discharge status: Home / Self Care | Payer: Medicare Other | Source: Ambulatory Visit | Attending: General Surgery | Admitting: General Surgery

## 2015-09-10 DIAGNOSIS — I252 Old myocardial infarction: Secondary | ICD-10-CM | POA: Diagnosis not present

## 2015-09-10 DIAGNOSIS — N183 Chronic kidney disease, stage 3 (moderate): Secondary | ICD-10-CM | POA: Diagnosis not present

## 2015-09-10 DIAGNOSIS — N400 Enlarged prostate without lower urinary tract symptoms: Secondary | ICD-10-CM | POA: Diagnosis not present

## 2015-09-10 DIAGNOSIS — R339 Retention of urine, unspecified: Secondary | ICD-10-CM | POA: Diagnosis not present

## 2015-09-10 DIAGNOSIS — Z888 Allergy status to other drugs, medicaments and biological substances status: Secondary | ICD-10-CM | POA: Diagnosis not present

## 2015-09-10 DIAGNOSIS — I2510 Atherosclerotic heart disease of native coronary artery without angina pectoris: Secondary | ICD-10-CM | POA: Diagnosis not present

## 2015-09-10 DIAGNOSIS — I340 Nonrheumatic mitral (valve) insufficiency: Secondary | ICD-10-CM | POA: Diagnosis not present

## 2015-09-10 DIAGNOSIS — K219 Gastro-esophageal reflux disease without esophagitis: Secondary | ICD-10-CM | POA: Diagnosis not present

## 2015-09-10 DIAGNOSIS — Z85038 Personal history of other malignant neoplasm of large intestine: Secondary | ICD-10-CM | POA: Diagnosis not present

## 2015-09-10 DIAGNOSIS — Z87442 Personal history of urinary calculi: Secondary | ICD-10-CM | POA: Diagnosis not present

## 2015-09-10 DIAGNOSIS — I255 Ischemic cardiomyopathy: Secondary | ICD-10-CM | POA: Diagnosis not present

## 2015-09-10 DIAGNOSIS — I5022 Chronic systolic (congestive) heart failure: Secondary | ICD-10-CM | POA: Diagnosis not present

## 2015-09-10 DIAGNOSIS — K4090 Unilateral inguinal hernia, without obstruction or gangrene, not specified as recurrent: Secondary | ICD-10-CM | POA: Diagnosis not present

## 2015-09-10 DIAGNOSIS — Z951 Presence of aortocoronary bypass graft: Secondary | ICD-10-CM | POA: Diagnosis not present

## 2015-09-10 DIAGNOSIS — Z7982 Long term (current) use of aspirin: Secondary | ICD-10-CM | POA: Diagnosis not present

## 2015-09-10 DIAGNOSIS — I129 Hypertensive chronic kidney disease with stage 1 through stage 4 chronic kidney disease, or unspecified chronic kidney disease: Secondary | ICD-10-CM | POA: Diagnosis not present

## 2015-09-10 DIAGNOSIS — Z952 Presence of prosthetic heart valve: Secondary | ICD-10-CM | POA: Diagnosis not present

## 2015-09-10 DIAGNOSIS — D631 Anemia in chronic kidney disease: Secondary | ICD-10-CM | POA: Diagnosis not present

## 2015-09-10 DIAGNOSIS — E1122 Type 2 diabetes mellitus with diabetic chronic kidney disease: Secondary | ICD-10-CM | POA: Diagnosis not present

## 2015-09-10 DIAGNOSIS — E785 Hyperlipidemia, unspecified: Secondary | ICD-10-CM | POA: Diagnosis not present

## 2015-09-10 LAB — BASIC METABOLIC PANEL
Anion gap: 12 (ref 5–15)
BUN: 34 mg/dL — ABNORMAL HIGH (ref 6–20)
CHLORIDE: 104 mmol/L (ref 101–111)
CO2: 22 mmol/L (ref 22–32)
Calcium: 9.3 mg/dL (ref 8.9–10.3)
Creatinine, Ser: 1.94 mg/dL — ABNORMAL HIGH (ref 0.61–1.24)
GFR calc non Af Amer: 29 mL/min — ABNORMAL LOW (ref >60)
GFR, EST AFRICAN AMERICAN: 34 mL/min — AB (ref >60)
Glucose, Bld: 147 mg/dL — ABNORMAL HIGH (ref 65–99)
POTASSIUM: 4.4 mmol/L (ref 3.5–5.1)
SODIUM: 138 mmol/L (ref 135–145)

## 2015-09-10 LAB — CBC
HEMATOCRIT: 37.2 % — AB (ref 39.0–52.0)
HEMOGLOBIN: 12.5 g/dL — AB (ref 13.0–17.0)
MCH: 33.3 pg (ref 26.0–34.0)
MCHC: 33.6 g/dL (ref 30.0–36.0)
MCV: 99.2 fL (ref 78.0–100.0)
Platelets: 118 10*3/uL — ABNORMAL LOW (ref 150–400)
RBC: 3.75 MIL/uL — AB (ref 4.22–5.81)
RDW: 13.4 % (ref 11.5–15.5)
WBC: 5.4 10*3/uL (ref 4.0–10.5)

## 2015-09-10 NOTE — Progress Notes (Signed)
Anesthesia PAT Evaluation: Patient is a 81 year old male scheduled for open right inguinal hernia repair on 09/13/15 by Dr. Reece Agar. PAT was at [facility] on Friday.  History includes AS with CAD/MI s/p AVR (pericardial tissue) and CABG (LIMA-LAD, SVG-PDA, SVG-D2) 9/01/14/25 complicated by RLE cellulitis and recurrent left pleural effusion requiring Pleurx catheter 07/24/12 and talc pleurodesis 09/12/12, ischemic cardiomyopathy, chronic systolic CHF, moderate mitral regurgitation, CKD stage III, anemia, colon cancer '90's s/p resection, HLD, GERD, borderline DM, HTN, CVA (no residual symptoms) '01  PCP is Dr. Hollace Kinnier. She saw patient for a pre-operative evaluation with EKG on 08/23/15. Her note indicates that he exercises daily. No CP or new SOB. Feels he has stable exertional dyspnea for the past 2-3 years. She felt his cardiac status was "stable now for 4 years," but did discuss risks due to his age and medical history. He does not see a nephrologist.   Cardiologist was Dr. Haroldine Laws, but last visit was in 08/2013.  HEM-ONC is Dr. Alen Blew, last visit 3//9/16 for anemia of chronic disease and renal insufficiency. Last Aranesp was ~ 07/2014.   Meds include Amoxicillin (pre-op SBE prophylaxis), ASA, Coreg, Lasix, hydralazine, Imdur, Nitro, Prilosec, Flomax, Travatan. Denied Nitro use.  PAT Vitals: BP 176/54, HR 57, RR 18, T 37.2C, 95% RA. Heart RRR. I did not appreciate a murmur. Lungs clear. Trace pre-tibial edema. Full upper denture, partial lower.   08/23/15 EKG: SR, LAD, LAFB, LVH with repolarization abnormality. Diffuse ST abnormality, mostly non-specific but more prominent in lateral leads--which appears new since 05/21/13 tracing. He continues to deny CP or SOB at rest. He walks 15-20 minutes 2-3X/day weather permitting and has not noticed any decrease in his endurance or new CV symptoms. No new edema.   04/08/13 Echo: Study Conclusions - Left ventricle: Tech. difficult study. Signifciant hypokinesis  inferior wall and inferolateral wall. The cavity size was mildly to moderately dilated. Wall thickness was increased in a pattern of mild LVH. The estimated ejection fraction was 40%. Findings consistent with left ventricular diastolic dysfunction. Doppler parameters are consistent with high ventricular filling pressure. - Mitral valve: Calcified annulus. Mildly thickened leaflets . Mild regurgitation. - Left atrium: The atrium was moderately dilated. - Right ventricle: The cavity size was mildly to moderately dilated. Systolic function was mildly to moderately reduced. - Right atrium: The atrium was mildly dilated. - Pulmonary arteries: PA peak pressure: 65mm Hg (S). (Prior EF 25% on 08/06/12.)  03/23/14 Carotid duplex: Summary: Bilateral: 1-39% ICA stenosis. Vertebral artery flow is antegrade. Right: ICA/CCA ratio is 1.7. Severe ECA stenosis. Left: ICA/CCA ratio is 0.93. Moderate ECA stenosis.  Preoperative labs noted. PLT count 118K, stable. H/H 12.5/37.2, stable. His BMET is pending as of 5:19 PM. I've asked one of our late nurses to follow-up results. His Cr has been 2.0-2.82 since at least 05/2013 (primarily ~ 2.1 - 2.3).  I would overall consider his Cr stable if it is ~ 2-2.5.  If higher, I've asked them to follow-up with our on-call anesthesiologist. I did review his cardiac history with anesthesiologist Dr. Veatrice Kells. Since patient has medical clearance after recent evaluation and coronary revascularization within the past 5 years then would anticipate that if labs are stable and no new CV changes that he could proceed as planned.  George Hugh [facility]/Anesthesiology Phone [phone] 09/10/2015 5:19 PM

## 2015-09-10 NOTE — Pre-Procedure Instructions (Signed)
    Adelard Sanon  09/10/2015      [id number] - [place], [place] [address] [place] [address] Phone: [phone] Fax: [phone]    Your procedure is scheduled on 09/13/15.  Report to [facility] Admitting at 530 A.M.  Call this number if you have problems the morning of surgery:  [phone]   Remember:  Do not eat food or drink liquids after midnight.  Take these medicines the morning of surgery with A SIP OF WATER --carvedilol,hyralazine,imdur,prilosec   Do not wear jewelry, make-up or nail polish.  Do not wear lotions, powders, or perfumes.  You may wear deodorant.  Do not shave 48 hours prior to surgery.  Men may shave face and neck.  Do not bring valuables to the hospital.  [facility] is not responsible for any belongings or valuables.  Contacts, dentures or bridgework may not be worn into surgery.  Leave your suitcase in the car.  After surgery it may be brought to your room.  For patients admitted to the hospital, discharge time will be determined by your treatment team.  Patients discharged the day of surgery will not be allowed to drive home.   Name and phone number of your driver: Special instructions:    Please read over the following fact sheets that you were given. Pain Booklet, Coughing and Deep Breathing and Surgical Site Infection Prevention

## 2015-09-11 DIAGNOSIS — Z7982 Long term (current) use of aspirin: Secondary | ICD-10-CM | POA: Diagnosis not present

## 2015-09-11 DIAGNOSIS — I2510 Atherosclerotic heart disease of native coronary artery without angina pectoris: Secondary | ICD-10-CM | POA: Diagnosis not present

## 2015-09-11 DIAGNOSIS — Z85038 Personal history of other malignant neoplasm of large intestine: Secondary | ICD-10-CM | POA: Diagnosis not present

## 2015-09-11 DIAGNOSIS — E1122 Type 2 diabetes mellitus with diabetic chronic kidney disease: Secondary | ICD-10-CM | POA: Diagnosis not present

## 2015-09-11 DIAGNOSIS — Z951 Presence of aortocoronary bypass graft: Secondary | ICD-10-CM | POA: Diagnosis not present

## 2015-09-11 DIAGNOSIS — N183 Chronic kidney disease, stage 3 (moderate): Secondary | ICD-10-CM | POA: Diagnosis not present

## 2015-09-11 DIAGNOSIS — K4090 Unilateral inguinal hernia, without obstruction or gangrene, not specified as recurrent: Secondary | ICD-10-CM | POA: Diagnosis not present

## 2015-09-11 DIAGNOSIS — K219 Gastro-esophageal reflux disease without esophagitis: Secondary | ICD-10-CM | POA: Diagnosis not present

## 2015-09-11 DIAGNOSIS — I340 Nonrheumatic mitral (valve) insufficiency: Secondary | ICD-10-CM | POA: Diagnosis not present

## 2015-09-11 DIAGNOSIS — I5022 Chronic systolic (congestive) heart failure: Secondary | ICD-10-CM | POA: Diagnosis not present

## 2015-09-11 DIAGNOSIS — Z888 Allergy status to other drugs, medicaments and biological substances status: Secondary | ICD-10-CM | POA: Diagnosis not present

## 2015-09-11 DIAGNOSIS — I255 Ischemic cardiomyopathy: Secondary | ICD-10-CM | POA: Diagnosis not present

## 2015-09-11 DIAGNOSIS — I252 Old myocardial infarction: Secondary | ICD-10-CM | POA: Diagnosis not present

## 2015-09-11 DIAGNOSIS — I129 Hypertensive chronic kidney disease with stage 1 through stage 4 chronic kidney disease, or unspecified chronic kidney disease: Secondary | ICD-10-CM | POA: Diagnosis not present

## 2015-09-11 DIAGNOSIS — D631 Anemia in chronic kidney disease: Secondary | ICD-10-CM | POA: Diagnosis not present

## 2015-09-11 DIAGNOSIS — Z87442 Personal history of urinary calculi: Secondary | ICD-10-CM | POA: Diagnosis not present

## 2015-09-11 DIAGNOSIS — R339 Retention of urine, unspecified: Secondary | ICD-10-CM | POA: Diagnosis not present

## 2015-09-11 DIAGNOSIS — Z952 Presence of prosthetic heart valve: Secondary | ICD-10-CM | POA: Diagnosis not present

## 2015-09-11 DIAGNOSIS — N400 Enlarged prostate without lower urinary tract symptoms: Secondary | ICD-10-CM | POA: Diagnosis not present

## 2015-09-11 DIAGNOSIS — E785 Hyperlipidemia, unspecified: Secondary | ICD-10-CM | POA: Diagnosis not present

## 2015-09-12 MED ORDER — HEPARIN SODIUM (PORCINE) 5000 UNIT/ML IJ SOLN
5000.0000 [IU] | INTRAMUSCULAR | Status: AC
  Administered 2015-09-13: 5000 [IU] via SUBCUTANEOUS
  Filled 2015-09-12: qty 1

## 2015-09-12 MED ORDER — CEFAZOLIN SODIUM-DEXTROSE 2-3 GM-% IV SOLR
2.0000 g | INTRAVENOUS | Status: AC
  Administered 2015-09-13: 2 g via INTRAVENOUS
  Filled 2015-09-12: qty 50

## 2015-09-13 ENCOUNTER — Ambulatory Visit (HOSPITAL_COMMUNITY): Payer: Medicare Other | Admitting: Anesthesiology

## 2015-09-13 ENCOUNTER — Observation Stay (HOSPITAL_COMMUNITY)
Admission: RE | Admit: 2015-09-13 | Discharge: 2015-09-14 | Disposition: A | Discharge status: Home / Self Care | Payer: Medicare Other | Source: Ambulatory Visit | Attending: General Surgery | Admitting: General Surgery

## 2015-09-13 ENCOUNTER — Encounter (HOSPITAL_COMMUNITY): Payer: Self-pay | Admitting: *Deleted

## 2015-09-13 ENCOUNTER — Encounter (HOSPITAL_COMMUNITY)
Admission: RE | Disposition: A | Discharge status: Home / Self Care | Payer: Self-pay | Source: Ambulatory Visit | Attending: General Surgery

## 2015-09-13 ENCOUNTER — Other Ambulatory Visit: Payer: Medicare Other

## 2015-09-13 ENCOUNTER — Ambulatory Visit (HOSPITAL_COMMUNITY): Payer: Medicare Other | Admitting: Vascular Surgery

## 2015-09-13 DIAGNOSIS — Z952 Presence of prosthetic heart valve: Secondary | ICD-10-CM | POA: Insufficient documentation

## 2015-09-13 DIAGNOSIS — I5042 Chronic combined systolic (congestive) and diastolic (congestive) heart failure: Secondary | ICD-10-CM | POA: Diagnosis present

## 2015-09-13 DIAGNOSIS — Z888 Allergy status to other drugs, medicaments and biological substances status: Secondary | ICD-10-CM | POA: Insufficient documentation

## 2015-09-13 DIAGNOSIS — N183 Chronic kidney disease, stage 3 unspecified: Secondary | ICD-10-CM | POA: Diagnosis present

## 2015-09-13 DIAGNOSIS — Z951 Presence of aortocoronary bypass graft: Secondary | ICD-10-CM | POA: Insufficient documentation

## 2015-09-13 DIAGNOSIS — I5022 Chronic systolic (congestive) heart failure: Secondary | ICD-10-CM | POA: Insufficient documentation

## 2015-09-13 DIAGNOSIS — N400 Enlarged prostate without lower urinary tract symptoms: Secondary | ICD-10-CM | POA: Insufficient documentation

## 2015-09-13 DIAGNOSIS — Z7982 Long term (current) use of aspirin: Secondary | ICD-10-CM | POA: Insufficient documentation

## 2015-09-13 DIAGNOSIS — E1122 Type 2 diabetes mellitus with diabetic chronic kidney disease: Secondary | ICD-10-CM | POA: Insufficient documentation

## 2015-09-13 DIAGNOSIS — I255 Ischemic cardiomyopathy: Secondary | ICD-10-CM | POA: Insufficient documentation

## 2015-09-13 DIAGNOSIS — R339 Retention of urine, unspecified: Secondary | ICD-10-CM | POA: Insufficient documentation

## 2015-09-13 DIAGNOSIS — I129 Hypertensive chronic kidney disease with stage 1 through stage 4 chronic kidney disease, or unspecified chronic kidney disease: Secondary | ICD-10-CM | POA: Diagnosis not present

## 2015-09-13 DIAGNOSIS — D631 Anemia in chronic kidney disease: Secondary | ICD-10-CM | POA: Insufficient documentation

## 2015-09-13 DIAGNOSIS — I340 Nonrheumatic mitral (valve) insufficiency: Secondary | ICD-10-CM | POA: Insufficient documentation

## 2015-09-13 DIAGNOSIS — I2510 Atherosclerotic heart disease of native coronary artery without angina pectoris: Secondary | ICD-10-CM | POA: Insufficient documentation

## 2015-09-13 DIAGNOSIS — K4090 Unilateral inguinal hernia, without obstruction or gangrene, not specified as recurrent: Principal | ICD-10-CM

## 2015-09-13 DIAGNOSIS — K219 Gastro-esophageal reflux disease without esophagitis: Secondary | ICD-10-CM | POA: Insufficient documentation

## 2015-09-13 DIAGNOSIS — E785 Hyperlipidemia, unspecified: Secondary | ICD-10-CM | POA: Insufficient documentation

## 2015-09-13 DIAGNOSIS — Z87442 Personal history of urinary calculi: Secondary | ICD-10-CM | POA: Insufficient documentation

## 2015-09-13 DIAGNOSIS — Z85038 Personal history of other malignant neoplasm of large intestine: Secondary | ICD-10-CM | POA: Insufficient documentation

## 2015-09-13 DIAGNOSIS — I252 Old myocardial infarction: Secondary | ICD-10-CM | POA: Insufficient documentation

## 2015-09-13 DIAGNOSIS — G8918 Other acute postprocedural pain: Secondary | ICD-10-CM | POA: Diagnosis not present

## 2015-09-13 HISTORY — PX: INSERTION OF MESH: SHX5868

## 2015-09-13 HISTORY — PX: INGUINAL HERNIA REPAIR: SHX194

## 2015-09-13 LAB — CREATININE, SERUM
CREATININE: 2.17 mg/dL — AB (ref 0.61–1.24)
GFR calc Af Amer: 30 mL/min — ABNORMAL LOW (ref >60)
GFR calc non Af Amer: 26 mL/min — ABNORMAL LOW (ref >60)

## 2015-09-13 LAB — GLUCOSE, CAPILLARY
GLUCOSE-CAPILLARY: 182 mg/dL — AB (ref 65–99)
Glucose-Capillary: 180 mg/dL — ABNORMAL HIGH (ref 65–99)
Glucose-Capillary: 243 mg/dL — ABNORMAL HIGH (ref 65–99)
Glucose-Capillary: 190 mg/dL — ABNORMAL HIGH (ref 65–99)

## 2015-09-13 LAB — CBC
HCT: 29.6 % — ABNORMAL LOW (ref 39.0–52.0)
HEMOGLOBIN: 9.7 g/dL — AB (ref 13.0–17.0)
MCH: 32.8 pg (ref 26.0–34.0)
MCHC: 32.8 g/dL (ref 30.0–36.0)
MCV: 100 fL (ref 78.0–100.0)
Platelets: 118 10*3/uL — ABNORMAL LOW (ref 150–400)
RBC: 2.96 MIL/uL — ABNORMAL LOW (ref 4.22–5.81)
RDW: 13.5 % (ref 11.5–15.5)
WBC: 8.2 10*3/uL (ref 4.0–10.5)

## 2015-09-13 SURGERY — REPAIR, HERNIA, INGUINAL, ADULT
Anesthesia: General | Site: Groin | Laterality: Right

## 2015-09-13 MED ORDER — LACTATED RINGERS IV SOLN
INTRAVENOUS | Status: DC | PRN
  Administered 2015-09-13 (×2): via INTRAVENOUS

## 2015-09-13 MED ORDER — SUCCINYLCHOLINE CHLORIDE 20 MG/ML IJ SOLN
INTRAMUSCULAR | Status: AC
  Filled 2015-09-13: qty 1

## 2015-09-13 MED ORDER — FENTANYL CITRATE (PF) 100 MCG/2ML IJ SOLN
INTRAMUSCULAR | Status: AC
  Administered 2015-09-13: 25 ug via INTRAVENOUS
  Filled 2015-09-13: qty 2

## 2015-09-13 MED ORDER — SENNOSIDES-DOCUSATE SODIUM 8.6-50 MG PO TABS: 1.0000 | ORAL_TABLET | Freq: Every evening | ORAL | Status: DC | PRN

## 2015-09-13 MED ORDER — PROMETHAZINE HCL 25 MG/ML IJ SOLN
12.5000 mg | Freq: Four times a day (QID) | INTRAMUSCULAR | Status: DC | PRN
  Administered 2015-09-13: 12.5 mg via INTRAVENOUS
  Filled 2015-09-13: qty 1

## 2015-09-13 MED ORDER — DEXTROSE-NACL 5-0.45 % IV SOLN
INTRAVENOUS | Status: DC
Start: 2015-09-13 — End: 2015-09-14
  Administered 2015-09-13: 12:00:00 via INTRAVENOUS

## 2015-09-13 MED ORDER — OXYCODONE HCL 5 MG PO TABS
5.0000 mg | ORAL_TABLET | ORAL | Status: DC | PRN
  Administered 2015-09-13 – 2015-09-14 (×2): 10 mg via ORAL
  Filled 2015-09-13: qty 2

## 2015-09-13 MED ORDER — OXYCODONE HCL 5 MG/5ML PO SOLN: 5.0000 mg | Freq: Once | ORAL | Status: DC | PRN

## 2015-09-13 MED ORDER — BUPIVACAINE-EPINEPHRINE 0.25% -1:200000 IJ SOLN
INTRAMUSCULAR | Status: DC | PRN
  Administered 2015-09-13: 15 mL

## 2015-09-13 MED ORDER — ACETAMINOPHEN 650 MG RE SUPP: 650.0000 mg | Freq: Four times a day (QID) | RECTAL | Status: DC | PRN

## 2015-09-13 MED ORDER — FENTANYL CITRATE (PF) 100 MCG/2ML IJ SOLN
25.0000 ug | INTRAMUSCULAR | Status: DC | PRN
  Administered 2015-09-13 (×3): 25 ug via INTRAVENOUS

## 2015-09-13 MED ORDER — OXYCODONE HCL 5 MG PO TABS
ORAL_TABLET | ORAL | Status: AC
  Administered 2015-09-13: 10 mg via ORAL
  Filled 2015-09-13: qty 2

## 2015-09-13 MED ORDER — ONDANSETRON HCL 4 MG/2ML IJ SOLN: 4.0000 mg | Freq: Four times a day (QID) | INTRAMUSCULAR | Status: DC | PRN

## 2015-09-13 MED ORDER — FENTANYL CITRATE (PF) 250 MCG/5ML IJ SOLN
INTRAMUSCULAR | Status: AC
  Filled 2015-09-13: qty 5

## 2015-09-13 MED ORDER — EPHEDRINE SULFATE 50 MG/ML IJ SOLN
INTRAMUSCULAR | Status: DC | PRN
  Administered 2015-09-13: 10 mg via INTRAVENOUS
  Administered 2015-09-13: 5 mg via INTRAVENOUS

## 2015-09-13 MED ORDER — FENTANYL CITRATE (PF) 100 MCG/2ML IJ SOLN
INTRAMUSCULAR | Status: DC | PRN
  Administered 2015-09-13 (×4): 25 ug via INTRAVENOUS

## 2015-09-13 MED ORDER — LIDOCAINE HCL (CARDIAC) 20 MG/ML IV SOLN
INTRAVENOUS | Status: DC | PRN
  Administered 2015-09-13: 60 mg via INTRAVENOUS

## 2015-09-13 MED ORDER — LIDOCAINE HCL (CARDIAC) 20 MG/ML IV SOLN
INTRAVENOUS | Status: AC
  Filled 2015-09-13: qty 5

## 2015-09-13 MED ORDER — MORPHINE SULFATE (PF) 2 MG/ML IV SOLN
INTRAVENOUS | Status: AC
  Filled 2015-09-13: qty 1

## 2015-09-13 MED ORDER — ONDANSETRON HCL 4 MG/2ML IJ SOLN
4.0000 mg | Freq: Once | INTRAMUSCULAR | Status: AC | PRN
  Administered 2015-09-13: 4 mg via INTRAVENOUS

## 2015-09-13 MED ORDER — BUPIVACAINE-EPINEPHRINE (PF) 0.5% -1:200000 IJ SOLN
INTRAMUSCULAR | Status: DC | PRN
  Administered 2015-09-13: 30 mL

## 2015-09-13 MED ORDER — MORPHINE SULFATE (PF) 2 MG/ML IV SOLN
2.0000 mg | INTRAVENOUS | Status: DC | PRN
  Administered 2015-09-13 (×2): 2 mg via INTRAVENOUS
  Filled 2015-09-13 (×2): qty 1

## 2015-09-13 MED ORDER — PROPOFOL 10 MG/ML IV BOLUS
INTRAVENOUS | Status: AC
  Filled 2015-09-13: qty 20

## 2015-09-13 MED ORDER — EPHEDRINE SULFATE 50 MG/ML IJ SOLN
INTRAMUSCULAR | Status: AC
  Filled 2015-09-13: qty 1

## 2015-09-13 MED ORDER — 0.9 % SODIUM CHLORIDE (POUR BTL) OPTIME
TOPICAL | Status: DC | PRN
  Administered 2015-09-13: 1000 mL

## 2015-09-13 MED ORDER — OXYCODONE HCL 5 MG PO TABS: 5.0000 mg | ORAL_TABLET | Freq: Once | ORAL | Status: DC | PRN

## 2015-09-13 MED ORDER — ROCURONIUM BROMIDE 50 MG/5ML IV SOLN
INTRAVENOUS | Status: AC
  Filled 2015-09-13: qty 1

## 2015-09-13 MED ORDER — ONDANSETRON 4 MG PO TBDP: 4.0000 mg | ORAL_TABLET | Freq: Four times a day (QID) | ORAL | Status: DC | PRN

## 2015-09-13 MED ORDER — ONDANSETRON HCL 4 MG/2ML IJ SOLN
INTRAMUSCULAR | Status: AC
  Filled 2015-09-13: qty 2

## 2015-09-13 MED ORDER — NEOSTIGMINE METHYLSULFATE 10 MG/10ML IV SOLN
INTRAVENOUS | Status: AC
  Filled 2015-09-13: qty 1

## 2015-09-13 MED ORDER — HEPARIN SODIUM (PORCINE) 5000 UNIT/ML IJ SOLN
5000.0000 [IU] | Freq: Three times a day (TID) | INTRAMUSCULAR | Status: DC
  Administered 2015-09-14: 5000 [IU] via SUBCUTANEOUS
  Filled 2015-09-13: qty 1

## 2015-09-13 MED ORDER — ONDANSETRON HCL 4 MG/2ML IJ SOLN
INTRAMUSCULAR | Status: DC | PRN
  Administered 2015-09-13: 4 mg via INTRAVENOUS

## 2015-09-13 MED ORDER — PROPOFOL 10 MG/ML IV BOLUS
INTRAVENOUS | Status: DC | PRN
  Administered 2015-09-13: 100 mg via INTRAVENOUS

## 2015-09-13 MED ORDER — INSULIN ASPART 100 UNIT/ML ~~LOC~~ SOLN
0.0000 [IU] | Freq: Three times a day (TID) | SUBCUTANEOUS | Status: DC
  Administered 2015-09-13: 2 [IU] via SUBCUTANEOUS
  Administered 2015-09-13: 3 [IU] via SUBCUTANEOUS
  Administered 2015-09-14: 2 [IU] via SUBCUTANEOUS

## 2015-09-13 MED ORDER — GLYCOPYRROLATE 0.2 MG/ML IJ SOLN
INTRAMUSCULAR | Status: AC
  Filled 2015-09-13: qty 2

## 2015-09-13 MED ORDER — ACETAMINOPHEN 325 MG PO TABS: 650.0000 mg | ORAL_TABLET | Freq: Four times a day (QID) | ORAL | Status: DC | PRN

## 2015-09-13 MED ORDER — SODIUM CHLORIDE 0.9 % IJ SOLN
INTRAMUSCULAR | Status: AC
  Filled 2015-09-13: qty 10

## 2015-09-13 MED ORDER — BUPIVACAINE-EPINEPHRINE (PF) 0.25% -1:200000 IJ SOLN
INTRAMUSCULAR | Status: AC
  Filled 2015-09-13: qty 30

## 2015-09-13 MED ORDER — PHENYLEPHRINE 40 MCG/ML (10ML) SYRINGE FOR IV PUSH (FOR BLOOD PRESSURE SUPPORT)
PREFILLED_SYRINGE | INTRAVENOUS | Status: AC
  Filled 2015-09-13: qty 10

## 2015-09-13 SURGICAL SUPPLY — 46 items
APL SKNCLS STERI-STRIP NONHPOA (GAUZE/BANDAGES/DRESSINGS) ×1
APPLIER CLIP LOGIC TI 5 (MISCELLANEOUS) IMPLANT
BENZOIN TINCTURE PRP APPL 2/3 (GAUZE/BANDAGES/DRESSINGS) ×3 IMPLANT
BLADE SURG ROTATE 9660 (MISCELLANEOUS) IMPLANT
CANISTER SUCTION 2500CC (MISCELLANEOUS) IMPLANT
CHLORAPREP W/TINT 26ML (MISCELLANEOUS) ×3 IMPLANT
CLOSURE WOUND 1/2 X4 (GAUZE/BANDAGES/DRESSINGS) ×1
COVER SURGICAL LIGHT HANDLE (MISCELLANEOUS) ×3 IMPLANT
DEVICE SECURE STRAP 25 ABSORB (INSTRUMENTS) IMPLANT
DISSECT BALLN SPACEMKR + OVL (BALLOONS)
DISSECTOR BALLN SPACEMKR + OVL (BALLOONS) IMPLANT
DISSECTOR BLUNT TIP ENDO 5MM (MISCELLANEOUS) IMPLANT
DRAIN PENROSE 1/4X12 LTX STRL (WOUND CARE) ×3 IMPLANT
DRAPE LAPAROSCOPIC ABDOMINAL (DRAPES) ×3 IMPLANT
ELECT REM PT RETURN 9FT ADLT (ELECTROSURGICAL) ×3
ELECTRODE REM PT RTRN 9FT ADLT (ELECTROSURGICAL) ×1 IMPLANT
GLOVE BIO SURGEON STRL SZ7 (GLOVE) ×3 IMPLANT
GLOVE BIOGEL PI IND STRL 7.0 (GLOVE) ×3 IMPLANT
GLOVE BIOGEL PI INDICATOR 7.0 (GLOVE) ×6
GLOVE ECLIPSE 6.5 STRL STRAW (GLOVE) ×3 IMPLANT
GLOVE SURG SS PI 7.0 STRL IVOR (GLOVE) ×3 IMPLANT
GOWN STRL REUS W/ TWL LRG LVL3 (GOWN DISPOSABLE) ×3 IMPLANT
GOWN STRL REUS W/TWL LRG LVL3 (GOWN DISPOSABLE) ×6
KIT BASIN OR (CUSTOM PROCEDURE TRAY) ×3 IMPLANT
KIT ROOM TURNOVER OR (KITS) ×3 IMPLANT
LIQUID BAND (GAUZE/BANDAGES/DRESSINGS) IMPLANT
MESH BARD SOFT 3X6IN (Mesh General) ×3 IMPLANT
NS IRRIG 1000ML POUR BTL (IV SOLUTION) ×3 IMPLANT
PACK GENERAL/GYN (CUSTOM PROCEDURE TRAY) ×3 IMPLANT
PAD ARMBOARD 7.5X6 YLW CONV (MISCELLANEOUS) ×3 IMPLANT
SET IRRIG TUBING LAPAROSCOPIC (IRRIGATION / IRRIGATOR) IMPLANT
SET TROCAR LAP APPLE-HUNT 5MM (ENDOMECHANICALS) IMPLANT
SPONGE GAUZE 4X4 12PLY STER LF (GAUZE/BANDAGES/DRESSINGS) ×3 IMPLANT
STRIP CLOSURE SKIN 1/2X4 (GAUZE/BANDAGES/DRESSINGS) ×2 IMPLANT
SUT MNCRL AB 4-0 PS2 18 (SUTURE) ×3 IMPLANT
SUT PROLENE 0 CT 1 CR/8 (SUTURE) ×3 IMPLANT
SUT VIC AB 2-0 SH 27 (SUTURE) ×3
SUT VIC AB 2-0 SH 27X BRD (SUTURE) ×1 IMPLANT
SUT VIC AB 3-0 SH 27 (SUTURE) ×4
SUT VIC AB 3-0 SH 27XBRD (SUTURE) ×2 IMPLANT
TAPE CLOTH SURG 4X10 WHT LF (GAUZE/BANDAGES/DRESSINGS) ×3 IMPLANT
TOWEL OR 17X24 6PK STRL BLUE (TOWEL DISPOSABLE) ×3 IMPLANT
TOWEL OR 17X26 10 PK STRL BLUE (TOWEL DISPOSABLE) ×3 IMPLANT
TRAY FOLEY CATH 16FR SILVER (SET/KITS/TRAYS/PACK) IMPLANT
TRAY LAPAROSCOPIC MC (CUSTOM PROCEDURE TRAY) IMPLANT
TUBING INSUFFLATION (TUBING) IMPLANT

## 2015-09-13 NOTE — Anesthesia Postprocedure Evaluation (Signed)
  Anesthesia Post-op Note  Patient: [name] [name]  Procedure(s) Performed: Procedure(s) (LRB): OPEN RIGHT INGUINAL HERNIA (Right) INSERTION OF MESH (Right)  Patient Location: PACU  Anesthesia Type: General and TAP block  Level of Consciousness: awake and alert   Airway and Oxygen Therapy: Patient Spontanous Breathing  Post-op Pain: mild  Post-op Assessment: Post-op Vital signs reviewed, Patient's Cardiovascular Status Stable, Respiratory Function Stable, Patent Airway and No signs of Nausea or vomiting  Last Vitals:  Filed Vitals:   09/13/15 0905  BP: 171/69  Pulse: 53  Temp: 36.6 C  Resp: 22    Post-op Vital Signs: stable   Complications: No apparent anesthesia complications

## 2015-09-13 NOTE — Op Note (Signed)
Preoperative diagnosis: right reducible inguinal hernia  Postoperative diagnosis: right reducible indirect inguinal hernia   Procedure: open repair of right inguinal hernia with mesh  Surgeon: Gurney Maxin, M.D.  Asst: none  Anesthesia: general LMA   Indications for procedure:[name] [name] presents with symptoms of groin pain and enlarging mass consistent with inguinal hernia. After discussing the pathology, risks and benefits and details of the procedure he decided to proceed with open inguinal hernia repair.  Description of procedure: Patient was brought to the upper suite placed supine anesthesia was administered with LMA. WHO checklist was applied patient was then prepped and draped in the usual sterile fashion. After identifying the ASIS and pubic tubercle A oblique incision was made approximately 1 cm above the inguinal ligament. Artery was used to dissect out the Camper's and Scarpa's fascia. The external abdominal oblique was sharply incised and the external inguinal ring was opened. Circumflex fibers were pushed away and the contents of the cord were brought out into the field. [place] Penrose was used to keep the cord structures in the field. Hernia sac was dissected away from the contents cord. As deference pampiniform plexus and artery vessels were identified. Once the hernia sac was completely free of the area was reduced back into the abdominal cavity. Of note the hernia sac was opened to assist in the reduction of the abdominal contents and colon as well as large amount of fat and omentum was seen. The sac was closed with a 3-0 Vicryl running suture. Next a Bard light weight mesh of 6 x 3" was placed into the space and sutured to the lacunar ligament as well as inguinal ligament and the conjoined tendon using 0 Prolene interrupted sutures. structures were placed through a fishtail cut in the mesh and lie in a neutral fashion. The external abdominal oblique was closed with a running 2-0 Vicryl.  This fascia was closed with a running 3-0 Vicryl. 4-0 Monocryl was used to close subcuticular stitch and Steri-Strips and bandage were put in place for dressing. 15 mL of quarter percent Marcaine with epinephrine was used to perform a block as well as anesthetize the subcutaneous area. Patient awoke from anesthesia and was brought to PACU in stable condition tolerated the procedure well.  Findings: large indirect inguinal hernia with colon and omentum found in the sac.  Specimen: appendix  Blood loss: 50cc  Local anesthesia: 15cc 0.25% marcaine w epi  Complications: none  Implant: bard light weight 6x3" mesh  Gurney Maxin, M.D. General, Bariatric, & Minimally Invasive Surgery [facility]

## 2015-09-13 NOTE — H&P (View-Only) (Signed)
e Heart Failure Diabetes Mellitus Enlarged Prostate Gastroesophageal Reflux Disease High blood pressure Inguinal Hernia Kidney Stone Myocardial infarction Other disease, cancer, significant illness  Past Surgical History Elbert Ewings, CMA; 08/18/2015 2:48 PM) Cataract Surgery Right. Colon Removal - Partial Coronary Artery Bypass Graft Oral Surgery Tonsillectomy Valve Replacement Vasectomy  Diagnostic Studies History Elbert Ewings, CMA; 08/18/2015 2:48 PM) Colonoscopy 5-10 years ago  Allergies Elbert Ewings, CMA; 08/18/2015 2:48 PM) Statins Support *DIETARY PRODUCTS/DIETARY MANAGEMENT PRODUCTS*  Medication History Elbert Ewings, CMA; 08/18/2015 2:50 PM) Carvedilol (25MG  Tablet, Oral) Active. Furosemide (40MG  Tablet, Oral) Active. HydrALAZINE HCl (25MG  Tablet, Oral) Active. Isosorbide Mononitrate ER (60MG  Tablet ER 24HR, Oral) Active. Nitrostat (0.4MG  Tab Sublingual, Sublingual) Active. Tamsulosin HCl (0.4MG  Capsule, Oral) Active. Travatan Z (0.004% Solution, Ophthalmic) Active. Travoprost (BAK Free) (0.004% Solution, Ophthalmic) Active. Aspirin (81MG  Tablet, Oral) Active. Omeprazole (20MG  Tablet DR, Oral) Active. Medications Reconciled  Social History Elbert Ewings, [place]; 08/18/2015 2:48 PM) Caffeine use Coffee. No alcohol use No drug use Tobacco use Never smoker.  Family History Elbert Ewings, [place]; 08/18/2015 2:48 PM) Alcohol Abuse Father. Arthritis Daughter. Cancer Mother. Heart Disease Father, Sister. Hypertension Mother, Sister.  Review of Systems Elbert Ewings CMA; 08/18/2015 2:48 PM) General Not Present- Appetite Loss, Chills, Fatigue, Fever, Night Sweats, Weight Gain and Weight Loss. Skin Not Present- Change in Wart/Mole, Dryness, Hives, Jaundice, New Lesions, Non-Healing Wounds, Rash and Ulcer. HEENT Present- Wears glasses/contact lenses. Not Present- Earache, Hearing Loss, Hoarseness, Nose Bleed, Oral Ulcers, Ringing in the Ears,  Seasonal Allergies, Sinus Pain, Sore Throat, Visual Disturbances and Yellow Eyes. Respiratory Not Present- Bloody sputum, Chronic Cough, Difficulty Breathing, Snoring and Wheezing. Breast Not Present- Breast Mass, Breast Pain, Nipple Discharge and Skin Changes. Cardiovascular Present- Shortness of Breath. Not Present- Chest Pain, Difficulty Breathing Lying Down, Leg Cramps, Palpitations, Rapid Heart Rate and Swelling of Extremities. Gastrointestinal Present- Difficulty Swallowing. Not Present- Abdominal Pain, Bloating, Bloody Stool, Change in Bowel Habits, Chronic diarrhea, Constipation, Excessive gas, Gets full quickly at meals, Hemorrhoids, Indigestion, Nausea, Rectal Pain and Vomiting. Male Genitourinary Present- Frequency and Nocturia. Not Present- Blood in Urine, Change in Urinary Stream, Impotence, Painful Urination, Urgency and Urine Leakage. Musculoskeletal Not Present- Back Pain, Joint Pain, Joint Stiffness, Muscle Pain, Muscle Weakness and Swelling of Extremities. Neurological Not Present- Decreased Memory, Fainting, Headaches, Numbness, Seizures, Tingling, Tremor, Trouble walking and Weakness. Psychiatric Not Present- Anxiety, Bipolar, Change in Sleep Pattern, Depression, Fearful and Frequent crying. Endocrine Not Present- Cold Intolerance, Excessive Hunger, Hair Changes, Heat Intolerance, Hot flashes and New Diabetes. Hematology Present- Easy Bruising. Not Present- Excessive bleeding, Gland problems, HIV and Persistent Infections.   Vitals Elbert Ewings CMA; 08/18/2015 2:50 PM) 08/18/2015 2:50 PM Weight: 157 lb Height: 68in Body Surface Area: 1.85 m Body Mass Index: 23.87 kg/m Temp.: 97.1F(Temporal)  Pulse: 70 (Regular)  BP: 124/64 (Sitting, Left Arm, Standard)    Physical Exam Geoffery Spruce MD; 08/18/2015 3:15 PM) General Mental Status-Alert. General Appearance-Cooperative. Orientation-Oriented X4. Posture-Normal posture.  Integumentary Global  Assessment Upon inspection and palpation of skin surfaces of the - Head/Face: no rashes, ulcers, lesions or evidence of photo damage. No palpable nodules or masses and Neck: no visible lesions or palpable masses.  Head and Neck Head-normocephalic, atraumatic with no lesions or palpable masses. Face Global Assessment - atraumatic. Thyroid Gland Characteristics - normal size and consistency.  Eye Eyeball - Bilateral-Extraocular movements intact. Sclera/Conjunctiva - Bilateral-No scleral icterus, No Discharge.  ENMT Nose and Sinuses External Inspection of the Nose - no deformities observed, no swelling present.  Chest and Lung Exam Palpation Palpation of the chest reveals - Non-tender. Auscultation Breath sounds - Normal.  Cardiovascular Auscultation Rhythm - Regular. Heart Sounds - S1 WNL and S2 WNL. Carotid arteries - No Carotid bruit.  Abdomen Inspection Inspection of the abdomen reveals - No Visible peristalsis, No Abnormal pulsations and No Paradoxical movements. Hernias - Inguinal hernia - Right - Reducible(large right inguinal hernia. Defect > 3cm. While reducing multiple bowel sounds heard.). Palpation/Percussion Palpation and Percussion of the abdomen reveal - Soft, Non Tender, No Rebound tenderness, No Rigidity (guarding), No hepatosplenomegaly and No Palpable abdominal masses.  Peripheral Vascular Upper Extremity Palpation - Pulses bilaterally normal. Lower Extremity Palpation - Edema - Bilateral - No edema.  Neurologic Neurologic evaluation reveals -normal sensation and normal coordination.  Neuropsychiatric Mental status exam performed with findings of-able to articulate well with normal speech/language, rate, volume and coherence and thought content normal with ability to perform basic computations and apply abstract reasoning.  Musculoskeletal Normal Exam - Bilateral-Upper Extremity Strength Normal and Lower Extremity Strength  Normal.    Assessment & Plan Geoffery Spruce MD; 08/18/2015 3:12 PM) RIGHT INGUINAL HERNIA (K40.90) Impression: 81 yo man with long presence right inguinal hernia. Currently filled with intestines, reducible on exam, symptomatic with pain and discomfort. He is fairly healthy after CABG in 2013 he is able to walk 1/4 mile without stopping. We discussed options for repair and agreed to proceed with open right inguinal repair with mesh after appropriate optimization. Current Plans  Pt Education - Pamphlet Given - Hernia Surgery: discussed with patient and provided information.   The anatomy & physiology of the abdominal wall and pelvic floor was discussed. The pathophysiology of hernias in the inguinal and pelvic region was discussed. Natural history risks such as progressive enlargement, pain, incarceration, and strangulation was discussed. Contributors to complications such as smoking, obesity, diabetes, prior surgery, etc were discussed.  I feel the risks of no intervention will lead to serious problems that outweigh the operative risks; therefore, I recommended surgery to reduce and repair the hernia. I explained laparoscopic techniques with possible need for an open approach. I noted usual use of mesh to patch and/or buttress hernia repair  Risks such as bleeding, infection, abscess, need for further treatment, heart attack, death, and other risks were discussed. I noted a good likelihood this will help address the problem. Goals of post-operative recovery were discussed as well. Possibility that this will not correct all symptoms was explained. I stressed the importance of low-impact activity, aggressive pain control, avoiding constipation, & not pushing through pain to minimize risk of post-operative chronic pain or injury. Possibility of reherniation was discussed. We will work to minimize complications.  An educational handout further explaining the pathology & treatment options was given as  well. Questions were answered. The patient expresses understanding & wishes to proceed with surgery.   I recommended obtaining preoperative medical clearance. I am concerned about the health of the patient and the ability to tolerate the operation. Therefore, we will request clearance by medicine to better assess operative risk & see if a reevaluation, further workup, etc is needed. Also recommendations on how medications should be managed/held/restarted after surgery. -Due to the full dose aspirin and his increased bruising I would like to switch him to 81mg  ASA daily 2 weeks prior to procedure to decrease risk of hematoma formation as once reduced he will have a large empty space in his scrotum.

## 2015-09-13 NOTE — Interval H&P Note (Signed)
History and Physical Interval Note:  81 yo male with symptomatic reducible right inguinal hernia which continues to enlarge. He has a good quality of life and is independent and well controlled with appropriate medications for his heart and kidney disease. Due to size and comorbidities we decided to proceed with open inguinal hernia repair. No recent illnesses or new cough or other symptoms.  09/13/2015 7:16 AM  [name] [name]  has presented today for surgery, with the diagnosis of right inguinal hernia  The various methods of treatment have been discussed with the patient and family. After consideration of risks, benefits and other options for treatment, the patient has consented to  Procedure(s): OPEN RIGHT INGUINAL HERNIA (Right) INSERTION OF MESH (Right) as a surgical intervention .  The patient's history has been reviewed, patient examined, no change in status, stable for surgery.  I have reviewed the patient's chart and labs.  Questions were answered to the patient's satisfaction.     Arta Bruce Drew Herman

## 2015-09-13 NOTE — Anesthesia Preprocedure Evaluation (Signed)
Anesthesia Evaluation  Patient identified by MRN, date of birth, ID band Patient awake    Reviewed: Allergy & Precautions, H&P , NPO status , Patient's Chart, lab work & pertinent test results, reviewed documented beta blocker date and time   Airway Mallampati: II  TM Distance: >3 FB Neck ROM: full    Dental   Pulmonary neg pulmonary ROS,    breath sounds clear to auscultation       Cardiovascular hypertension, On Medications and On Home Beta Blockers + CAD, + Past MI and +CHF  + Valvular Problems/Murmurs AS  Rhythm:regular  S/P CABG 3 vessel and AVR in 2013 for AS, CAD -Echo 2014 with EF 40% that is improved, moderate MR    Neuro/Psych CVA negative neurological ROS  negative psych ROS   GI/Hepatic Neg liver ROS, GERD  Medicated,  Endo/Other  negative endocrine ROSdiabetes  Renal/GU Renal InsufficiencyRenal disease  negative genitourinary   Musculoskeletal   Abdominal   Peds  Hematology  (+) anemia ,   Anesthesia Other Findings Exercises daily with no new weight gain, chest pain or syncope, no signs concerning of heart failure, followed by PCP and cleared for surgery  Reproductive/Obstetrics negative OB ROS                             Anesthesia Physical  Anesthesia Plan  ASA: III  Anesthesia Plan: General   Post-op Pain Management:    Induction: Intravenous  Airway Management Planned: LMA  Additional Equipment:   Intra-op Plan:   Post-operative Plan: Extubation in OR  Informed Consent: I have reviewed the patients History and Physical, chart, labs and discussed the procedure including the risks, benefits and alternatives for the proposed anesthesia with the patient or authorized representative who has indicated his/her understanding and acceptance.   Dental Advisory Given  Plan Discussed with: CRNA, Surgeon and Anesthesiologist  Anesthesia Plan Comments:          Anesthesia Quick Evaluation

## 2015-09-13 NOTE — Transfer of Care (Signed)
Immediate Anesthesia Transfer of Care Note  Patient: [name] [name]  Procedure(s) Performed: Procedure(s): OPEN RIGHT INGUINAL HERNIA (Right) INSERTION OF MESH (Right)  Patient Location: PACU  Anesthesia Type:General  Level of Consciousness: awake, alert , oriented and patient cooperative  Airway & Oxygen Therapy: Patient Spontanous Breathing and Patient connected to face mask oxygen  Post-op Assessment: Report given to RN, Post -op Vital signs reviewed and stable and Patient moving all extremities  Post vital signs: Reviewed and stable  Last Vitals:  Filed Vitals:   09/13/15 0612  BP: 150/46  Pulse: 52  Temp: 36.2 C  Resp: 18    Complications: No apparent anesthesia complications

## 2015-09-13 NOTE — Anesthesia Procedure Notes (Addendum)
Procedure Name: LMA Insertion Date/Time: 09/13/2015 7:32 AM Performed by: Izora Gala Pre-anesthesia Checklist: Patient identified, Emergency Drugs available, Suction available and Patient being monitored Patient Re-evaluated:Patient Re-evaluated prior to inductionOxygen Delivery Method: Circle system utilized Preoxygenation: Pre-oxygenation with 100% oxygen Intubation Type: IV induction Ventilation: Mask ventilation without difficulty LMA: LMA inserted LMA Size: 5.0 Number of attempts: 1 Placement Confirmation: ETT inserted through vocal cords under direct vision,  positive ETCO2 and breath sounds checked- equal and bilateral Tube secured with: Tape Dental Injury: Teeth and Oropharynx as per pre-operative assessment    Anesthesia Regional Block:  TAP block  Pre-Anesthetic Checklist: ,, timeout performed, Correct Patient, Correct Site, Correct Laterality, Correct Procedure, Correct Position, site marked, Risks and benefits discussed,  Surgical consent,  Pre-op evaluation,  At surgeon's request and post-op pain management  Laterality: Right  Prep: chloraprep       Needles:  Injection technique: Single-shot  Needle Type: Echogenic Stimulator Needle     Needle Length: 5cm 5 cm Needle Gauge: 21 and 21 G    Additional Needles:  Procedures: ultrasound guided (picture in chart) TAP block Narrative:  Injection made incrementally with aspirations every 5 mL.  Performed by: Personally  Anesthesiologist: Reisa Coppola  Additional Notes: Risks, benefits and alternative to block explained extensively.  Patient tolerated procedure well, without complications.

## 2015-09-14 ENCOUNTER — Encounter (HOSPITAL_COMMUNITY): Payer: Self-pay | Admitting: General Surgery

## 2015-09-14 DIAGNOSIS — K4090 Unilateral inguinal hernia, without obstruction or gangrene, not specified as recurrent: Secondary | ICD-10-CM | POA: Diagnosis not present

## 2015-09-14 DIAGNOSIS — E1122 Type 2 diabetes mellitus with diabetic chronic kidney disease: Secondary | ICD-10-CM | POA: Diagnosis not present

## 2015-09-14 DIAGNOSIS — Z7982 Long term (current) use of aspirin: Secondary | ICD-10-CM | POA: Diagnosis not present

## 2015-09-14 DIAGNOSIS — I5022 Chronic systolic (congestive) heart failure: Secondary | ICD-10-CM | POA: Diagnosis not present

## 2015-09-14 DIAGNOSIS — I129 Hypertensive chronic kidney disease with stage 1 through stage 4 chronic kidney disease, or unspecified chronic kidney disease: Secondary | ICD-10-CM | POA: Diagnosis not present

## 2015-09-14 DIAGNOSIS — N400 Enlarged prostate without lower urinary tract symptoms: Secondary | ICD-10-CM | POA: Diagnosis not present

## 2015-09-14 LAB — CBC
HEMATOCRIT: 25.7 % — AB (ref 39.0–52.0)
HEMOGLOBIN: 8.2 g/dL — AB (ref 13.0–17.0)
MCH: 32.2 pg (ref 26.0–34.0)
MCHC: 31.9 g/dL (ref 30.0–36.0)
MCV: 100.8 fL — ABNORMAL HIGH (ref 78.0–100.0)
Platelets: 104 10*3/uL — ABNORMAL LOW (ref 150–400)
RBC: 2.55 MIL/uL — ABNORMAL LOW (ref 4.22–5.81)
RDW: 13.6 % (ref 11.5–15.5)
WBC: 5.6 10*3/uL (ref 4.0–10.5)

## 2015-09-14 LAB — BASIC METABOLIC PANEL
ANION GAP: 4 — AB (ref 5–15)
BUN: 35 mg/dL — ABNORMAL HIGH (ref 6–20)
CALCIUM: 8.2 mg/dL — AB (ref 8.9–10.3)
CHLORIDE: 105 mmol/L (ref 101–111)
CO2: 28 mmol/L (ref 22–32)
CREATININE: 2.37 mg/dL — AB (ref 0.61–1.24)
GFR calc non Af Amer: 23 mL/min — ABNORMAL LOW (ref >60)
GFR, EST AFRICAN AMERICAN: 27 mL/min — AB (ref >60)
Glucose, Bld: 185 mg/dL — ABNORMAL HIGH (ref 65–99)
Potassium: 4.5 mmol/L (ref 3.5–5.1)
SODIUM: 137 mmol/L (ref 135–145)

## 2015-09-14 LAB — GLUCOSE, CAPILLARY: GLUCOSE-CAPILLARY: 168 mg/dL — AB (ref 65–99)

## 2015-09-14 MED ORDER — PANTOPRAZOLE SODIUM 40 MG PO TBEC: 40.0000 mg | DELAYED_RELEASE_TABLET | Freq: Every day | ORAL | Status: DC

## 2015-09-14 MED ORDER — SODIUM CHLORIDE 0.9 % IV BOLUS (SEPSIS)
500.0000 mL | Freq: Once | INTRAVENOUS | Status: AC
  Administered 2015-09-14: 500 mL via INTRAVENOUS

## 2015-09-14 MED ORDER — OXYCODONE HCL 5 MG PO TABS: 5.0000 mg | ORAL_TABLET | ORAL | Status: DC | PRN

## 2015-09-14 MED ORDER — CARVEDILOL 25 MG PO TABS
25.0000 mg | ORAL_TABLET | Freq: Two times a day (BID) | ORAL | Status: DC
  Administered 2015-09-14: 25 mg via ORAL
  Filled 2015-09-14: qty 1

## 2015-09-14 MED ORDER — TAMSULOSIN HCL 0.4 MG PO CAPS: 0.4000 mg | ORAL_CAPSULE | Freq: Every day | ORAL | Status: DC

## 2015-09-14 MED ORDER — HYDRALAZINE HCL 25 MG PO TABS
25.0000 mg | ORAL_TABLET | Freq: Three times a day (TID) | ORAL | Status: DC
  Administered 2015-09-14: 25 mg via ORAL
  Filled 2015-09-14: qty 1

## 2015-09-14 MED ORDER — ISOSORBIDE DINITRATE 30 MG PO TABS
30.0000 mg | ORAL_TABLET | Freq: Two times a day (BID) | ORAL | Status: DC
  Filled 2015-09-14 (×2): qty 1

## 2015-09-14 NOTE — Progress Notes (Signed)
[name] [name] to be D/C'd Home per MD order.  Discussed with the patient and all questions fully answered.  VSS, Skin clean, dry and intact without evidence of skin break down, no evidence of skin tears noted. IV catheter discontinued intact. Site without signs and symptoms of complications. Dressing and pressure applied.  An After Visit Summary was printed and given to the patient. Patient received prescription.  D/c education completed with patient/family including follow up instructions, medication list, d/c activities limitations if indicated, with other d/c instructions as indicated by MD - patient able to verbalize understanding, all questions fully answered.   Patient instructed to return to ED, call 911, or call MD for any changes in condition.   Patient escorted via [place], and D/C home via private auto.  Donn Pierini Hasina Kreager 09/14/2015 10:26 AM

## 2015-09-14 NOTE — Discharge Summary (Signed)
Physician Discharge Summary  Patient ID: Eman Morimoto MRN: [id number] DOB/AGE: 1926-05-16 81 y.o.  Admit date: 09/13/2015 Discharge date: 09/14/2015  Admission Diagnoses:  Discharge Diagnoses:  Active Problems:   Chronic systolic heart failure (HCC)   Chronic kidney disease, stage III (moderate)   Right inguinal hernia   Inguinal hernia unilateral, non-recurrent   Discharged Condition: good  Hospital Course: 81 yo male underwent open RIH repair. Admitted afterward to observe for cardiac history. Pt had urinary retention treated with foley catheter and was able to void the next day.  Consults: None  Significant Diagnostic Studies: reviewed  Treatments: surgery: open RIH repair  Discharge Exam: Blood pressure 137/61, pulse 66, temperature 98.2 F (36.8 C), temperature source Oral, resp. rate 17, height 5\' 8"  (1.727 m), weight 71.923 kg (158 lb 9 oz), SpO2 95 %. General appearance: alert Head: Normocephalic, without obvious abnormality, atraumatic Resp: clear to auscultation bilaterally GI: soft, non-tender; bowel sounds normal; no masses,  no organomegaly and dressing in place without seep through  Disposition: 01-Home or Self Care  Discharge Instructions    Call MD for:  persistant nausea and vomiting    Complete by:  As directed      Call MD for:  severe uncontrolled pain    Complete by:  As directed      Call MD for:  temperature >100.4    Complete by:  As directed      Diet - low sodium heart healthy    Complete by:  As directed      Increase activity slowly    Complete by:  As directed      Lifting restrictions    Complete by:  As directed   10 pounds for 4 weeks     Remove dressing in 24 hours    Complete by:  As directed             Medication List    TAKE these medications        amoxicillin 500 MG capsule  Commonly known as:  AMOXIL  Take 4 capsules (2,000 mg total) by mouth See admin instructions. Take 4 capsules (2000 mg) prior to surgery or  dental appointments     aspirin EC 325 MG tablet  Take 325 mg by mouth daily.     carvedilol 25 MG tablet  Commonly known as:  COREG  TAKE 1 TABLET BY MOUTH TWICE A DAY WITH A MEAL     cholecalciferol 1000 UNITS tablet  Commonly known as:  VITAMIN D  Take 1,000 Units by mouth daily with supper.     furosemide 40 MG tablet  Commonly known as:  LASIX  Take 2 tablets (80 mg total) by mouth daily.     hydrALAZINE 25 MG tablet  Commonly known as:  APRESOLINE  TAKE 1 TABLET BY MOUTH 3 TIMES A DAY     isosorbide mononitrate 60 MG 24 hr tablet  Commonly known as:  IMDUR  TAKE 1 TABLET BY MOUTH EVERY DAY     multivitamin with minerals Tabs tablet  Take 1 tablet by mouth daily.     multivitamin-prenatal 27-0.8 MG Tabs tablet  Take 1 tablet by mouth daily with supper.     nitroGLYCERIN 0.4 MG SL tablet  Commonly known as:  NITROSTAT  Place 0.4 mg under the tongue every 5 (five) minutes as needed for chest pain.     omeprazole 20 MG capsule  Commonly known as:  PRILOSEC  Take 20 mg by mouth  daily.     oxyCODONE 5 MG immediate release tablet  Commonly known as:  Oxy IR/ROXICODONE  Take 1-2 tablets (5-10 mg total) by mouth every 4 (four) hours as needed for moderate pain.     tamsulosin 0.4 MG Caps capsule  Commonly known as:  FLOMAX  TAKE 1 CAPSULE BY MOUTH ONCE DAILY AFTER SUPPER     Tdap 5-2.5-18.5 LF-MCG/0.5 injection  Commonly known as:  BOOSTRIX  Inject 0.5 mLs into the muscle once.     Travoprost (BAK Free) 0.004 % Soln ophthalmic solution  Commonly known as:  TRAVATAN  Place 1 drop into both eyes daily.           Follow-up Information    Follow up with Mickeal Skinner, MD.   Specialty:  General Surgery   Why:  already scheduled   Contact information:   [address] [place] [address] [phone]       Signed: Arta Bruce Curtina Grills 09/14/2015, 9:40 AM

## 2015-09-16 ENCOUNTER — Ambulatory Visit: Payer: Medicare Other | Admitting: Internal Medicine

## 2015-09-17 ENCOUNTER — Telehealth (HOSPITAL_COMMUNITY): Payer: Self-pay | Admitting: Vascular Surgery

## 2015-09-17 ENCOUNTER — Telehealth: Payer: Self-pay | Admitting: *Deleted

## 2015-09-17 ENCOUNTER — Telehealth (HOSPITAL_COMMUNITY): Payer: Self-pay

## 2015-09-17 MED ORDER — AMBULATORY NON FORMULARY MEDICATION: Status: DC

## 2015-09-17 NOTE — Telephone Encounter (Signed)
Called pts wife and she told me she already spoke to the triage nurse.

## 2015-09-17 NOTE — Telephone Encounter (Signed)
[name] [name], wife called and stated that patient had inguinal hernia repair on 09/13/15. Home on 09/14/15. Weight on 09/12/15 was 152 and today it is 159.6. Patient has been taking Lasix 80mg  since 09/15/15. Wife stated that she has called Dr. Clayborne Dana [facility] for advice, and they instructed her to call you since you cleared him for surgery. Please Advise. Earlie Server called the wife this morning to schedule a hospital follow up, but wife refused making appointment.

## 2015-09-17 NOTE — Telephone Encounter (Signed)
Pt wife called pt has put on a lot of fluid weight, she would like someone to call her back ASAP.Marland Kitchen PLEASE ADVISE

## 2015-09-17 NOTE — Telephone Encounter (Signed)
Patients wife called.   Patient had inguinal hernia repair on 10/3.  Home on 10/4.  Weight prior to going to hospital 152 lbs  Oct 5th weighed 152 lbs.  Today weighs 159.6  No shortness of breath, chest pain or edema.  Did not have Lasix 80 mg October 3rd and 4th  Has had Lasix 80 mg October 5th, 6th and today.  Wife would like some guidance.  Last OV 08/18/2013  Wife does not feel she could get him to the office for a visit.  Instructed her to call Dr. Mariea Clonts who cleared him for surgery.  Will route to Dr. Haroldine Laws for advice

## 2015-09-17 NOTE — Telephone Encounter (Signed)
Patient is urinating.  Instructed patients wife to double is Lasix dose for 3 days and call Monday with patients weight per order of Dr. Haroldine Laws  Wife repeated instructions

## 2015-09-17 NOTE — Telephone Encounter (Signed)
He needs to be seen as soon as possible (Monday if there's nothing today).   He should take an extra lasix today, tomorrow and Sunday.  He also should take a potassium supplement--81meq daily for 3 days.  If he becomes increasingly short of breath, he should go to the emergency room over the weekend.

## 2015-09-17 NOTE — Telephone Encounter (Signed)
Patient wife notified. Patient has already spoken with Dr. Clayborne Dana office and they told him to double his Lasix for 3 days and call him on Monday. Wife stated that she is taking patient in for an appointment Monday with Dr. Haroldine Laws. I told her that he should also take the potassium for 3 days and she agreed. Called the Potassium into pharmacy. Wife agreed with SOB and taking him to the ER if he becomes SOB. Will call our office to schedule a follow up appointment once he sees Dr. Vaughan Browner.

## 2015-09-20 ENCOUNTER — Emergency Department (HOSPITAL_COMMUNITY): Payer: Medicare Other

## 2015-09-20 ENCOUNTER — Observation Stay (HOSPITAL_COMMUNITY): Payer: Medicare Other

## 2015-09-20 ENCOUNTER — Inpatient Hospital Stay (HOSPITAL_COMMUNITY)
Admission: EM | Admit: 2015-09-20 | Discharge: 2015-09-24 | DRG: 811 | Disposition: A | Discharge status: Skilled Nursing Facility | Payer: Medicare Other | Attending: Family Medicine | Admitting: Family Medicine

## 2015-09-20 ENCOUNTER — Encounter (HOSPITAL_COMMUNITY): Payer: Self-pay

## 2015-09-20 DIAGNOSIS — N189 Chronic kidney disease, unspecified: Secondary | ICD-10-CM | POA: Diagnosis not present

## 2015-09-20 DIAGNOSIS — Z794 Long term (current) use of insulin: Secondary | ICD-10-CM | POA: Diagnosis not present

## 2015-09-20 DIAGNOSIS — R059 Cough, unspecified: Secondary | ICD-10-CM | POA: Diagnosis present

## 2015-09-20 DIAGNOSIS — I2510 Atherosclerotic heart disease of native coronary artery without angina pectoris: Secondary | ICD-10-CM | POA: Diagnosis present

## 2015-09-20 DIAGNOSIS — R0682 Tachypnea, not elsewhere classified: Secondary | ICD-10-CM

## 2015-09-20 DIAGNOSIS — Z79899 Other long term (current) drug therapy: Secondary | ICD-10-CM

## 2015-09-20 DIAGNOSIS — K219 Gastro-esophageal reflux disease without esophagitis: Secondary | ICD-10-CM | POA: Diagnosis present

## 2015-09-20 DIAGNOSIS — B370 Candidal stomatitis: Secondary | ICD-10-CM | POA: Diagnosis not present

## 2015-09-20 DIAGNOSIS — I214 Non-ST elevation (NSTEMI) myocardial infarction: Secondary | ICD-10-CM | POA: Diagnosis not present

## 2015-09-20 DIAGNOSIS — N289 Disorder of kidney and ureter, unspecified: Secondary | ICD-10-CM

## 2015-09-20 DIAGNOSIS — E785 Hyperlipidemia, unspecified: Secondary | ICD-10-CM | POA: Diagnosis present

## 2015-09-20 DIAGNOSIS — R079 Chest pain, unspecified: Secondary | ICD-10-CM | POA: Diagnosis not present

## 2015-09-20 DIAGNOSIS — R066 Hiccough: Secondary | ICD-10-CM | POA: Diagnosis present

## 2015-09-20 DIAGNOSIS — N179 Acute kidney failure, unspecified: Secondary | ICD-10-CM | POA: Diagnosis not present

## 2015-09-20 DIAGNOSIS — R0789 Other chest pain: Secondary | ICD-10-CM | POA: Diagnosis present

## 2015-09-20 DIAGNOSIS — N2581 Secondary hyperparathyroidism of renal origin: Secondary | ICD-10-CM | POA: Diagnosis present

## 2015-09-20 DIAGNOSIS — Z951 Presence of aortocoronary bypass graft: Secondary | ICD-10-CM

## 2015-09-20 DIAGNOSIS — D649 Anemia, unspecified: Secondary | ICD-10-CM | POA: Diagnosis not present

## 2015-09-20 DIAGNOSIS — D62 Acute posthemorrhagic anemia: Secondary | ICD-10-CM | POA: Diagnosis not present

## 2015-09-20 DIAGNOSIS — N184 Chronic kidney disease, stage 4 (severe): Secondary | ICD-10-CM | POA: Diagnosis present

## 2015-09-20 DIAGNOSIS — I10 Essential (primary) hypertension: Secondary | ICD-10-CM

## 2015-09-20 DIAGNOSIS — I25110 Atherosclerotic heart disease of native coronary artery with unstable angina pectoris: Secondary | ICD-10-CM | POA: Diagnosis present

## 2015-09-20 DIAGNOSIS — D631 Anemia in chronic kidney disease: Secondary | ICD-10-CM | POA: Diagnosis not present

## 2015-09-20 DIAGNOSIS — Z85038 Personal history of other malignant neoplasm of large intestine: Secondary | ICD-10-CM

## 2015-09-20 DIAGNOSIS — E1122 Type 2 diabetes mellitus with diabetic chronic kidney disease: Secondary | ICD-10-CM | POA: Diagnosis present

## 2015-09-20 DIAGNOSIS — J189 Pneumonia, unspecified organism: Secondary | ICD-10-CM | POA: Diagnosis not present

## 2015-09-20 DIAGNOSIS — R7989 Other specified abnormal findings of blood chemistry: Secondary | ICD-10-CM | POA: Diagnosis present

## 2015-09-20 DIAGNOSIS — I255 Ischemic cardiomyopathy: Secondary | ICD-10-CM | POA: Diagnosis present

## 2015-09-20 DIAGNOSIS — Z888 Allergy status to other drugs, medicaments and biological substances status: Secondary | ICD-10-CM

## 2015-09-20 DIAGNOSIS — E1129 Type 2 diabetes mellitus with other diabetic kidney complication: Secondary | ICD-10-CM | POA: Diagnosis not present

## 2015-09-20 DIAGNOSIS — I5042 Chronic combined systolic (congestive) and diastolic (congestive) heart failure: Secondary | ICD-10-CM | POA: Diagnosis not present

## 2015-09-20 DIAGNOSIS — E118 Type 2 diabetes mellitus with unspecified complications: Secondary | ICD-10-CM | POA: Diagnosis present

## 2015-09-20 DIAGNOSIS — Z952 Presence of prosthetic heart valve: Secondary | ICD-10-CM

## 2015-09-20 DIAGNOSIS — Z66 Do not resuscitate: Secondary | ICD-10-CM | POA: Diagnosis present

## 2015-09-20 DIAGNOSIS — I25118 Atherosclerotic heart disease of native coronary artery with other forms of angina pectoris: Secondary | ICD-10-CM

## 2015-09-20 DIAGNOSIS — N183 Chronic kidney disease, stage 3 unspecified: Secondary | ICD-10-CM | POA: Diagnosis present

## 2015-09-20 DIAGNOSIS — I119 Hypertensive heart disease without heart failure: Secondary | ICD-10-CM | POA: Diagnosis present

## 2015-09-20 DIAGNOSIS — R1319 Other dysphagia: Secondary | ICD-10-CM | POA: Diagnosis present

## 2015-09-20 DIAGNOSIS — I340 Nonrheumatic mitral (valve) insufficiency: Secondary | ICD-10-CM | POA: Diagnosis present

## 2015-09-20 DIAGNOSIS — R071 Chest pain on breathing: Secondary | ICD-10-CM | POA: Diagnosis not present

## 2015-09-20 DIAGNOSIS — Y95 Nosocomial condition: Secondary | ICD-10-CM | POA: Diagnosis not present

## 2015-09-20 DIAGNOSIS — Z8249 Family history of ischemic heart disease and other diseases of the circulatory system: Secondary | ICD-10-CM

## 2015-09-20 DIAGNOSIS — Z7982 Long term (current) use of aspirin: Secondary | ICD-10-CM

## 2015-09-20 DIAGNOSIS — I252 Old myocardial infarction: Secondary | ICD-10-CM

## 2015-09-20 DIAGNOSIS — R05 Cough: Secondary | ICD-10-CM | POA: Diagnosis present

## 2015-09-20 DIAGNOSIS — R0602 Shortness of breath: Secondary | ICD-10-CM | POA: Diagnosis not present

## 2015-09-20 DIAGNOSIS — Z79891 Long term (current) use of opiate analgesic: Secondary | ICD-10-CM

## 2015-09-20 DIAGNOSIS — R778 Other specified abnormalities of plasma proteins: Secondary | ICD-10-CM | POA: Diagnosis present

## 2015-09-20 DIAGNOSIS — R918 Other nonspecific abnormal finding of lung field: Secondary | ICD-10-CM | POA: Diagnosis not present

## 2015-09-20 DIAGNOSIS — Z954 Presence of other heart-valve replacement: Secondary | ICD-10-CM

## 2015-09-20 DIAGNOSIS — I129 Hypertensive chronic kidney disease with stage 1 through stage 4 chronic kidney disease, or unspecified chronic kidney disease: Secondary | ICD-10-CM | POA: Diagnosis not present

## 2015-09-20 LAB — RETICULOCYTES
RBC.: 2.27 MIL/uL — AB (ref 4.22–5.81)
RETIC CT PCT: 3.5 % — AB (ref 0.4–3.1)
Retic Count, Absolute: 79.5 10*3/uL (ref 19.0–186.0)

## 2015-09-20 LAB — CBC WITH DIFFERENTIAL/PLATELET
Basophils Absolute: 0 10*3/uL (ref 0.0–0.1)
Basophils Relative: 0 %
EOS ABS: 0 10*3/uL (ref 0.0–0.7)
Eosinophils Relative: 0 %
HEMATOCRIT: 23.3 % — AB (ref 39.0–52.0)
HEMOGLOBIN: 7.4 g/dL — AB (ref 13.0–17.0)
LYMPHS ABS: 1.1 10*3/uL (ref 0.7–4.0)
LYMPHS PCT: 13 %
MCH: 32.2 pg (ref 26.0–34.0)
MCHC: 31.8 g/dL (ref 30.0–36.0)
MCV: 101.3 fL — ABNORMAL HIGH (ref 78.0–100.0)
MONOS PCT: 8 %
Monocytes Absolute: 0.7 10*3/uL (ref 0.1–1.0)
NEUTROS ABS: 6.8 10*3/uL (ref 1.7–7.7)
NEUTROS PCT: 79 %
Platelets: 180 10*3/uL (ref 150–400)
RBC: 2.3 MIL/uL — AB (ref 4.22–5.81)
RDW: 13.9 % (ref 11.5–15.5)
WBC: 8.6 10*3/uL (ref 4.0–10.5)

## 2015-09-20 LAB — BASIC METABOLIC PANEL
ANION GAP: 11 (ref 5–15)
BUN: 55 mg/dL — ABNORMAL HIGH (ref 6–20)
CHLORIDE: 97 mmol/L — AB (ref 101–111)
CO2: 29 mmol/L (ref 22–32)
Calcium: 8.8 mg/dL — ABNORMAL LOW (ref 8.9–10.3)
Creatinine, Ser: 2.67 mg/dL — ABNORMAL HIGH (ref 0.61–1.24)
GFR calc non Af Amer: 20 mL/min — ABNORMAL LOW (ref >60)
GFR, EST AFRICAN AMERICAN: 23 mL/min — AB (ref >60)
GLUCOSE: 296 mg/dL — AB (ref 65–99)
POTASSIUM: 4.4 mmol/L (ref 3.5–5.1)
Sodium: 137 mmol/L (ref 135–145)

## 2015-09-20 LAB — GLUCOSE, CAPILLARY
GLUCOSE-CAPILLARY: 195 mg/dL — AB (ref 65–99)
Glucose-Capillary: 251 mg/dL — ABNORMAL HIGH (ref 65–99)

## 2015-09-20 LAB — TROPONIN I
TROPONIN I: 0.14 ng/mL — AB (ref <0.031)
Troponin I: 0.28 ng/mL — ABNORMAL HIGH (ref <0.031)
Troponin I: 0.11 ng/mL — ABNORMAL HIGH (ref <0.031)

## 2015-09-20 LAB — CBC
HEMATOCRIT: 24.7 % — AB (ref 39.0–52.0)
HEMOGLOBIN: 8.2 g/dL — AB (ref 13.0–17.0)
MCH: 33.1 pg (ref 26.0–34.0)
MCHC: 33.2 g/dL (ref 30.0–36.0)
MCV: 99.6 fL (ref 78.0–100.0)
Platelets: 155 10*3/uL (ref 150–400)
RBC: 2.48 MIL/uL — AB (ref 4.22–5.81)
RDW: 14 % (ref 11.5–15.5)
WBC: 10.5 10*3/uL (ref 4.0–10.5)

## 2015-09-20 LAB — FERRITIN: Ferritin: 250 ng/mL (ref 24–336)

## 2015-09-20 LAB — POC OCCULT BLOOD, ED: FECAL OCCULT BLD: NEGATIVE

## 2015-09-20 LAB — FOLATE

## 2015-09-20 LAB — VITAMIN B12: VITAMIN B 12: 554 pg/mL (ref 180–914)

## 2015-09-20 LAB — IRON AND TIBC
Iron: 19 ug/dL — ABNORMAL LOW (ref 45–182)
Saturation Ratios: 10 % — ABNORMAL LOW (ref 17.9–39.5)
TIBC: 185 ug/dL — AB (ref 250–450)
UIBC: 166 ug/dL

## 2015-09-20 LAB — PHOSPHORUS: PHOSPHORUS: 2.7 mg/dL (ref 2.5–4.6)

## 2015-09-20 LAB — PREPARE RBC (CROSSMATCH)

## 2015-09-20 MED ORDER — ISOSORBIDE MONONITRATE ER 60 MG PO TB24: 60.0000 mg | ORAL_TABLET | Freq: Every day | ORAL | Status: DC

## 2015-09-20 MED ORDER — ACETAMINOPHEN 650 MG RE SUPP
650.0000 mg | RECTAL | Status: DC | PRN
  Administered 2015-09-20: 650 mg via RECTAL
  Filled 2015-09-20: qty 1

## 2015-09-20 MED ORDER — HYDRALAZINE HCL 25 MG PO TABS
25.0000 mg | ORAL_TABLET | Freq: Three times a day (TID) | ORAL | Status: DC
  Administered 2015-09-20 – 2015-09-24 (×11): 25 mg via ORAL
  Filled 2015-09-20 (×14): qty 1

## 2015-09-20 MED ORDER — ASPIRIN 81 MG PO CHEW: 324.0000 mg | CHEWABLE_TABLET | Freq: Once | ORAL | Status: DC

## 2015-09-20 MED ORDER — FUROSEMIDE 80 MG PO TABS
80.0000 mg | ORAL_TABLET | Freq: Every day | ORAL | Status: DC
  Administered 2015-09-21 – 2015-09-24 (×4): 80 mg via ORAL
  Filled 2015-09-20 (×4): qty 1

## 2015-09-20 MED ORDER — ALPRAZOLAM 0.25 MG PO TABS: 0.2500 mg | ORAL_TABLET | Freq: Two times a day (BID) | ORAL | Status: DC | PRN

## 2015-09-20 MED ORDER — PIPERACILLIN-TAZOBACTAM IN DEX 2-0.25 GM/50ML IV SOLN
2.2500 g | Freq: Four times a day (QID) | INTRAVENOUS | Status: DC
  Administered 2015-09-20 – 2015-09-22 (×6): 2.25 g via INTRAVENOUS
  Filled 2015-09-20 (×10): qty 50

## 2015-09-20 MED ORDER — SODIUM CHLORIDE 0.9 % IV SOLN: Freq: Once | INTRAVENOUS | Status: AC

## 2015-09-20 MED ORDER — ONDANSETRON HCL 4 MG PO TABS
4.0000 mg | ORAL_TABLET | Freq: Four times a day (QID) | ORAL | Status: DC | PRN
Start: 2015-09-20 — End: 2015-09-24

## 2015-09-20 MED ORDER — OXYCODONE HCL 5 MG PO TABS: 5.0000 mg | ORAL_TABLET | ORAL | Status: DC | PRN

## 2015-09-20 MED ORDER — SODIUM CHLORIDE 0.9 % IV SOLN
Freq: Once | INTRAVENOUS | Status: AC
  Administered 2015-09-20: 17:00:00 via INTRAVENOUS

## 2015-09-20 MED ORDER — POLYETHYLENE GLYCOL 3350 17 G PO PACK: 17.0000 g | PACK | Freq: Every day | ORAL | Status: DC | PRN

## 2015-09-20 MED ORDER — HEPARIN SODIUM (PORCINE) 5000 UNIT/ML IJ SOLN
5000.0000 [IU] | Freq: Three times a day (TID) | INTRAMUSCULAR | Status: DC
  Administered 2015-09-20 – 2015-09-24 (×10): 5000 [IU] via SUBCUTANEOUS
  Filled 2015-09-20 (×10): qty 1

## 2015-09-20 MED ORDER — ACETAMINOPHEN 325 MG PO TABS
650.0000 mg | ORAL_TABLET | ORAL | Status: DC | PRN
  Administered 2015-09-20: 650 mg via ORAL
  Filled 2015-09-20: qty 2

## 2015-09-20 MED ORDER — TAMSULOSIN HCL 0.4 MG PO CAPS
0.4000 mg | ORAL_CAPSULE | Freq: Every day | ORAL | Status: DC
  Administered 2015-09-20 – 2015-09-24 (×5): 0.4 mg via ORAL
  Filled 2015-09-20 (×6): qty 1

## 2015-09-20 MED ORDER — GI COCKTAIL ~~LOC~~: 30.0000 mL | Freq: Four times a day (QID) | ORAL | Status: DC | PRN

## 2015-09-20 MED ORDER — MAGIC MOUTHWASH
10.0000 mL | Freq: Four times a day (QID) | ORAL | Status: DC
  Administered 2015-09-21 – 2015-09-24 (×6): 10 mL via ORAL
  Filled 2015-09-20 (×20): qty 10

## 2015-09-20 MED ORDER — ONDANSETRON HCL 4 MG/2ML IJ SOLN: 4.0000 mg | Freq: Four times a day (QID) | INTRAMUSCULAR | Status: DC | PRN

## 2015-09-20 MED ORDER — CARVEDILOL 25 MG PO TABS
25.0000 mg | ORAL_TABLET | Freq: Two times a day (BID) | ORAL | Status: DC
  Administered 2015-09-20 – 2015-09-24 (×7): 25 mg via ORAL
  Filled 2015-09-20 (×10): qty 1

## 2015-09-20 MED ORDER — VANCOMYCIN HCL IN DEXTROSE 1-5 GM/200ML-% IV SOLN
1000.0000 mg | INTRAVENOUS | Status: DC
  Administered 2015-09-20: 1000 mg via INTRAVENOUS
  Filled 2015-09-20 (×2): qty 200

## 2015-09-20 MED ORDER — HEPARIN BOLUS VIA INFUSION
4000.0000 [IU] | Freq: Once | INTRAVENOUS | Status: AC
  Administered 2015-09-20: 4000 [IU] via INTRAVENOUS
  Filled 2015-09-20: qty 4000

## 2015-09-20 MED ORDER — INSULIN ASPART 100 UNIT/ML ~~LOC~~ SOLN
0.0000 [IU] | SUBCUTANEOUS | Status: DC
  Administered 2015-09-20: 5 [IU] via SUBCUTANEOUS
  Administered 2015-09-20: 2 [IU] via SUBCUTANEOUS
  Administered 2015-09-21: 1 [IU] via SUBCUTANEOUS
  Administered 2015-09-21 (×4): 2 [IU] via SUBCUTANEOUS
  Administered 2015-09-22: 1 [IU] via SUBCUTANEOUS
  Administered 2015-09-22 (×2): 2 [IU] via SUBCUTANEOUS

## 2015-09-20 MED ORDER — NITROGLYCERIN 0.4 MG SL SUBL: 0.4000 mg | SUBLINGUAL_TABLET | SUBLINGUAL | Status: DC | PRN

## 2015-09-20 MED ORDER — SODIUM CHLORIDE 0.9 % IV SOLN: INTRAVENOUS | Status: DC

## 2015-09-20 MED ORDER — PANTOPRAZOLE SODIUM 40 MG PO TBEC
40.0000 mg | DELAYED_RELEASE_TABLET | Freq: Every day | ORAL | Status: DC
  Administered 2015-09-21 – 2015-09-24 (×4): 40 mg via ORAL
  Filled 2015-09-20 (×4): qty 1

## 2015-09-20 MED ORDER — SODIUM CHLORIDE 0.9 % IJ SOLN
3.0000 mL | Freq: Two times a day (BID) | INTRAMUSCULAR | Status: DC
  Administered 2015-09-22 – 2015-09-24 (×3): 3 mL via INTRAVENOUS

## 2015-09-20 MED ORDER — NITROGLYCERIN IN D5W 200-5 MCG/ML-% IV SOLN
10.0000 ug/min | INTRAVENOUS | Status: DC
  Administered 2015-09-20: 10 ug/min via INTRAVENOUS
  Filled 2015-09-20: qty 250

## 2015-09-20 MED ORDER — CHLORPROMAZINE HCL 50 MG PO TABS
50.0000 mg | ORAL_TABLET | Freq: Three times a day (TID) | ORAL | Status: DC | PRN
  Filled 2015-09-20: qty 1

## 2015-09-20 MED ORDER — LATANOPROST 0.005 % OP SOLN
1.0000 [drp] | Freq: Every day | OPHTHALMIC | Status: DC
  Administered 2015-09-20 – 2015-09-23 (×4): 1 [drp] via OPHTHALMIC
  Filled 2015-09-20 (×2): qty 2.5

## 2015-09-20 MED ORDER — HEPARIN (PORCINE) IN NACL 100-0.45 UNIT/ML-% IJ SOLN
1000.0000 [IU]/h | INTRAMUSCULAR | Status: DC
  Filled 2015-09-20: qty 250

## 2015-09-20 NOTE — Consult Note (Signed)
CARDIOLOGY CONSULTATION NOTE.  NAME:  [name] [name]   MRN: [id number] DOB:  03/28/1926   ADMIT DATE: 09/20/2015  Reason for Consult: Chest Pain, + Troponin  Requesting Physician: Dr.Glick - EDP  Primary Cardiologist: Formerly Dr. Verl Blalock (also seen in [facility]F Clinic)  HPI: This is a 81 y.o. male with a past medical history significant for CAD -> (nstemi in Feb 2013) --> CABG/ AVR (LIMA-LAD, SVG-D2, SVG-RPDA, Bioprosthetic AVR), anemia - thought to be related to CKD-3, also h/o recurrent L Pleural effusion s/p Talc Pleurodesis.  He had type 2 diabetes mellitus controlled with diet and exercise, hypertension, hyperlipidemia. History of colon cancer status post resection. Also status post stroke following CABG with persistent right leg weakness. Last cardiology visit was April 2015 - at CHF clinic --> remained on aspirin, Coreg. No statins due to myalgias. Stable aortic valve by echo. EF is roughly 40% with no recurrent symptoms. He unfortunately has not had any further evaluations with cardiology since then, in part due to hepatic/GI issues in July of that year. At baseline he walks in a daily basis maybe a mile a day. Mild exertional dyspnea, but no changes over the last several years.  He recently underwent right inguinal herniorrhaphy on October 3. He denies any urinary retention treated with Foley catheter. He was discharged following day.  Shortly thereafter, he contacted cardiology clinic for roughly 7-8 pound weight gain postop with mild edema but no chest pain dyspnea or edema noted -- he was told to double up his Lasix dosing, but stopped on Sunday as his weight was back to baseline and he was beginning to feel dehydrated..  Talking to the patient, he actually did have some right-sided discomfort that started shortly thereafter -->  He was very unsteady and relating day 1 postop and had at least 2 falls. One of which he rolled forward extending his walker out in front of him and landed on his  chest. At this point I think is when he started having some right-sided chest pain per family recollection. He is not a great historian.   he began to feel relatively weak and lethargic yesterday (10/9) but had no major complaints or focal complaints. He even have a checkup's and the right-sided chest discomfort. He also noted that made the right sided pain worse. This morning with persistent right-sided chest discomfort and nausea. The pain was roughly 10 out of 10 when he first awoke and is subsequently sent sided. He described it as a sharp throbbing type pain. He is unable to tell me what is pre-CABG non-STEMI related chest pain was (I don't think he remembers much of that hospitalization).  He describes the chest pain is being sharp stabbing worse with deep inspiration and worse with cough/it up. He really did note that is worse with any particular activity as far as walking around. No real change to his baseline dyspnea.    he presented to [facility] emergency room earlier this morning and on initial evaluation had a troponin elevation of 0.28. We are contacted to evaluate him --> initial review of his laboratory studies showed anemia with a hemoglobin level of 7.4 that is notably down from postop level of 8.2 and preop level of 9.7. His creatinine also having increased from 2.37 postop to 2.67.   Of note, he really has not been having that much the way of any coughing that was noted to be significant - but has had occasional cough since the operation. No fevers, chills  or sweats.  Remainder of his Cardiovascular ROS: positive for - chest pain and dyspnea on exertion negative for - edema, irregular heartbeat, loss of consciousness, orthopnea, palpitations, paroxysmal nocturnal dyspnea, rapid heart rate, shortness of breath or Syncope/near syncope; TIA/amaurosis fugax  PMHx:  CARDIAC HISTORY:  2D Echo  03/2013: EF ~40%, mild LVH, Mod LV Dilation.  Significant HK of Inferior & Inferolateral Wall,  Mild MR, AoV stable (Peak/Mean Gradients 32/16 mmHg), mod LA dilation, RV mod dilated with mild-mod reduced function, elevated CVP  Last Cath was for NSTEMI --> pre CABG.  Past Medical History  Diagnosis Date  . History of colon cancer     s/p colon resection  . Aortic stenosis     a. s/p AVR with 18mm Edwards pericardial valve 02/07/12 - post-op course complicated by pleural effusion requring thoracentesis/leg cellulitis 03/2012.  Marland Kitchen CAD (coronary artery disease)     a. s/p NSTEMI 12/2011:  LHC - Ostial left main 20%, ostial LAD 50%, mid 60-70%, ostial D1 40% and mid 40%, D2 70%, ostial circumflex occluded, ostial RCA 80-90%, LVEDP was 42. b.  s/p CABG x 3 at time of AVR (LiMA-LAD, SVG-2nd daigonal, SVG-PDA) 02/07/12 (post-op course noted above).  . Ischemic cardiomyopathy   . Chronic systolic heart failure ([place])     a. TEE 01/2012: EF 25-30%, diffuse hypokinesis. b. Not on ACEI due to renal insufficiency.;  c. follow up  echo 08/06/12: EF 25%, mod diast dysfxn, AVR ok, mild MR, mod LAE, mild RAE, mild to mod RV systolic dysfunction  . HLD (hyperlipidemia)   . GERD (gastroesophageal reflux disease)   . Chronic kidney disease   . Pleural effusion     a. post-operatively after AVR/CABG s/p thoracentesis 03/2012 yielding 1L serosanguinous fluid.  . Diabetes mellitus     borderline  . Blood transfusion     NO REACTION TO TRANSFUSION  . Cellulitis     a. RLE cellulitis 2 months post-operatively after AVR/CABG - serratia marcessans, tx with I&D/antibiotics  . Mitral regurgitation     Moderate by TEE 01/2012  . Flash pulmonary edema ([place])     Post-cath 12/2011, went into acute pulm edema requiring IV lasix and intubation  . Baker's cyst 07/23/12    Incidental finding of LE venous dopplers  . Cataract     right eye, hx of  . HTN (hypertension)     primary, Dr. Hollace Kinnier  . Myocardial infarction ([place]) 12/2011  . CHF (congestive heart failure) ([place])   . Stroke ([place]) 10/01    RIght Leg weakness    . Cancer [facility]) '90's    Colon  . Anemia    Past Surgical History  Procedure Laterality Date  . Colon resection  1996  . Esophageal dilation    . Colonoscopy    . Cataract extraction      rt  . Cardiac catheterization      1.3.13  stopped breathing, put on ventilator for 5-6 days  . Tonsillectomy    . Aortic valve replacement  02/07/2012    Procedure: AORTIC VALVE REPLACEMENT (AVR);  Surgeon: Gaye Pollack, MD;  Location: [place];  Service: Open Heart Surgery;  Laterality: N/A;  . Coronary artery bypass graft  02/07/2012    Procedure: CORONARY ARTERY BYPASS GRAFTING (CABG);  Surgeon: Gaye Pollack, MD;  Location: [place];  Service: Open Heart Surgery;  Laterality: N/A;  CABG x three; using right leg greater saphenous vein harvested endoscopically  . Chest tube insertion  07/24/2012    Procedure: INSERTION PLEURAL DRAINAGE CATHETER;  Surgeon: Gaye Pollack, MD;  Location: [place];  Service: Thoracic;  Laterality: Left;  . Talc pleurodesis  08/30/2012    Procedure: TALC PLEURADESIS;  Surgeon: Gaye Pollack, MD;  Location: [place];  Service: Thoracic;  Laterality: Left;  INSERTION OF TALC VIA LEFT PLEURX  . Talc pleurodesis  09/12/2012    Procedure: TALC PLEURADESIS;  Surgeon: Gaye Pollack, MD;  Location: [place];  Service: Thoracic;  Laterality: Left;  . Removal of pleural drainage catheter  09/27/2012    Procedure: REMOVAL OF PLEURAL DRAINAGE CATHETER;  Surgeon: Gaye Pollack, MD;  Location: [place];  Service: Thoracic;  Laterality: Left;  Marland Kitchen Eye surgery  2009    cataract removed from right eye  . Esophagogastroduodenoscopy N/A 07/03/2014    Procedure: ESOPHAGOGASTRODUODENOSCOPY (EGD);  Surgeon: Arta Silence, MD;  Location: [facility] ENDOSCOPY;  Service: Endoscopy;  Laterality: N/A;  . Esophagoscopy with dilitation N/A 07/07/2014    Procedure: ESOPHAGOSCOPY WITH DILITATION/SAVARY DILATOR;  Surgeon: Rozetta Nunnery, MD;  Location: [place];  Service: ENT;  Laterality: N/A;  . Ercp N/A 07/08/2014     Procedure: ENDOSCOPIC RETROGRADE CHOLANGIOPANCREATOGRAPHY (ERCP);  Surgeon: Missy Sabins, MD;  Location: [facility] ENDOSCOPY;  Service: Endoscopy;  Laterality: N/A;  . Left heart catheterization with coronary angiogram N/A 12/13/2011    Procedure: LEFT HEART CATHETERIZATION WITH CORONARY ANGIOGRAM;  Surgeon: Peter M Martinique, MD;  Location: [facility] CATH LAB;  Service: Cardiovascular;  Laterality: N/A;  . Inguinal hernia repair Right 09/13/2015    Procedure: OPEN RIGHT INGUINAL HERNIA;  Surgeon: Mickeal Skinner, MD;  Location: [place];  Service: General;  Laterality: Right;  . Insertion of mesh Right 09/13/2015    Procedure: INSERTION OF MESH;  Surgeon: Mickeal Skinner, MD;  Location: [place];  Service: General;  Laterality: Right;    FAMHx: Family History  Problem Relation Age of Onset  . Cancer Mother   . Heart attack Father   . Mental illness Brother   . Kidney failure Brother     SOCHx:  reports that he has never smoked. He has never used smokeless tobacco. He reports that he does not drink alcohol or use illicit drugs.  ALLERGIES: Allergies  Allergen Reactions  . Statins Other (See Comments)    Pain/weakness in legs    ROS: Review of Systems  Constitutional: Positive for malaise/fatigue (not ready to walk  as much as he had been  1-2 days preoperatively from his herniorrhaphy).  Respiratory: Positive for cough (Nonproductive).   Cardiovascular: Positive for chest pain. Negative for palpitations, claudication, leg swelling and PND.  Gastrointestinal: Positive for nausea and abdominal pain. Negative for diarrhea, constipation and melena.  Musculoskeletal: Positive for joint pain and falls (2 falls on the day postop).  Neurological: Positive for dizziness and weakness (Generalized).  Endo/Heme/Allergies: Does not bruise/bleed easily.  Psychiatric/Behavioral: Negative for memory loss. The patient is nervous/anxious (about current symptoms).   All other systems reviewed and are  negative.   HOME MEDICATIONS: Prior to Admission medications   Medication Sig Start Date End Date Taking? Authorizing Provider  aspirin EC 325 MG tablet Take 325 mg by mouth daily.    Yes Historical Provider, MD  carvedilol (COREG) 25 MG tablet TAKE 1 TABLET BY MOUTH TWICE A DAY WITH A MEAL 08/03/15  Yes Jolaine Artist, MD  cholecalciferol (VITAMIN D) 1000 UNITS tablet Take 1,000 Units by mouth daily with supper.    Yes  Historical Provider, MD  furosemide (LASIX) 40 MG tablet Take 2 tablets (80 mg total) by mouth daily. 02/08/15  Yes Jolaine Artist, MD  hydrALAZINE (APRESOLINE) 25 MG tablet TAKE 1 TABLET BY MOUTH 3 TIMES A DAY 07/12/15  Yes [name] L [name], DO  isosorbide mononitrate (IMDUR) 60 MG 24 hr tablet TAKE 1 TABLET BY MOUTH EVERY DAY 05/04/15  Yes Jolaine Artist, MD  Multiple Vitamin (MULTIVITAMIN WITH MINERALS) TABS tablet Take 1 tablet by mouth daily.   Yes Historical Provider, MD  omeprazole (PRILOSEC) 20 MG capsule Take 20 mg by mouth daily.    Yes Historical Provider, MD  oxyCODONE (OXY IR/ROXICODONE) 5 MG immediate release tablet Take 1-2 tablets (5-10 mg total) by mouth every 4 (four) hours as needed for moderate pain. 09/14/15  Yes Arta Bruce Kinsinger, MD  Prenatal Vit-Fe Fumarate-FA (MULTIVITAMIN-PRENATAL) 27-0.8 MG TABS Take 1 tablet by mouth daily with supper.    Yes Historical Provider, MD  tamsulosin (FLOMAX) 0.4 MG CAPS capsule TAKE 1 CAPSULE BY MOUTH ONCE DAILY AFTER SUPPER 04/05/15  Yes [name] L [name], DO  Travoprost, BAK Free, (TRAVATAN) 0.004 % SOLN ophthalmic solution Place 1 drop into both eyes daily.    Yes Historical Provider, MD  AMBULATORY NON FORMULARY MEDICATION Potassium 40 meq Take one tablet by mouth once daily for 3 days Patient not taking: Reported on 09/20/2015 09/17/15   [name] L [name], DO  amoxicillin (AMOXIL) 500 MG capsule Take 4 capsules (2,000 mg total) by mouth See admin instructions. Take 4 capsules (2000 mg) prior to surgery or dental  appointments 08/23/15   [name] L [name], DO  nitroGLYCERIN (NITROSTAT) 0.4 MG SL tablet Place 0.4 mg under the tongue every 5 (five) minutes as needed for chest pain.    Historical Provider, MD  Tdap (BOOSTRIX) 5-2.5-18.5 LF-MCG/0.5 injection Inject 0.5 mLs into the muscle once. Patient not taking: Reported on 09/20/2015 08/23/15   [place] MEDICATIONS: Was started on nitroglycerin sublingual followed by drip.  VITALS: Blood pressure 142/50, pulse 61, temperature 98.7 F (37.1 C), temperature source Oral, resp. rate 18, height 5\' 8"  (1.727 m), weight 158 lb 8.2 oz (71.9 kg), SpO2 100 %.  PHYSICAL EXAM: General appearance: alert, cooperative and Heart of hearing. Somewhat tired appearing. Frail/pale but nontoxic HEENT: [place]/AT, EOMI, MMM, anicteric sclera Neck: no adenopathy, no carotid bruit, supple, symmetrical, trachea midline and He has significantly pulsatile jugular veins but no significant HJR Lungs: Crackles in left base, no significant rales or rhonchi. Nonlabored Heart: RRR with normal S1, S2; no significant M/R/G. Nondisplaced PMI.  Palpation along the chest revealed reproducible sternocostal tenderness along the right lower rib cage. This was definitely made worse with rolling over to his right and deep inspiration. Abdomen: soft, non-tender; bowel sounds normal; no masses,  no organomegaly and Mild tenderness in the right inguinal region Extremities: extremities normal, atraumatic, no cyanosis or edema Pulses: 2+ and symmetric Skin: Skin color, texture, turgor normal. No rashes or lesions Neurologic: Cranial nerves: normal; grossly normal   LABS: Results for orders placed or performed during the hospital encounter of 09/20/15 (from the past 24 hour(s))  Basic metabolic panel     Status: Abnormal   Collection Time: 09/20/15  5:46 AM  Result Value Ref Range   Sodium 137 135 - 145 mmol/L   Potassium 4.4 3.5 - 5.1 mmol/L   Chloride 97 (L) 101 - 111 mmol/L    CO2 29 22 - 32 mmol/L  Glucose, Bld 296 (H) 65 - 99 mg/dL   BUN 55 (H) 6 - 20 mg/dL   Creatinine, Ser 2.67 (H) 0.61 - 1.24 mg/dL   Calcium 8.8 (L) 8.9 - 10.3 mg/dL   GFR calc non Af Amer 20 (L) >60 mL/min   GFR calc Af Amer 23 (L) >60 mL/min   Anion gap 11 5 - 15  Troponin I     Status: Abnormal   Collection Time: 09/20/15  5:46 AM  Result Value Ref Range   Troponin I 0.28 (H) <0.031 ng/mL  CBC with Differential     Status: Abnormal   Collection Time: 09/20/15  5:46 AM  Result Value Ref Range   WBC 8.6 4.0 - 10.5 K/uL   RBC 2.30 (L) 4.22 - 5.81 MIL/uL   Hemoglobin 7.4 (L) 13.0 - 17.0 g/dL   HCT 23.3 (L) 39.0 - 52.0 %   MCV 101.3 (H) 78.0 - 100.0 fL   MCH 32.2 26.0 - 34.0 pg   MCHC 31.8 30.0 - 36.0 g/dL   RDW 13.9 11.5 - 15.5 %   Platelets 180 150 - 400 K/uL   Neutrophils Relative % 79 %   Neutro Abs 6.8 1.7 - 7.7 K/uL   Lymphocytes Relative 13 %   Lymphs Abs 1.1 0.7 - 4.0 K/uL   Monocytes Relative 8 %   Monocytes Absolute 0.7 0.1 - 1.0 K/uL   Eosinophils Relative 0 %   Eosinophils Absolute 0.0 0.0 - 0.7 K/uL   Basophils Relative 0 %   Basophils Absolute 0.0 0.0 - 0.1 K/uL  Type and screen for Red Blood Exchange     Status: None   Collection Time: 09/20/15  7:00 AM  Result Value Ref Range   ABO/RH(D) O POS    Antibody Screen NEG    Sample Expiration 09/23/2015   POC occult blood, ED Provider will collect     Status: None   Collection Time: 09/20/15  8:28 AM  Result Value Ref Range   Fecal Occult Bld NEGATIVE NEGATIVE  Reticulocytes     Status: Abnormal   Collection Time: 09/20/15  8:49 AM  Result Value Ref Range   Retic Ct Pct 3.5 (H) 0.4 - 3.1 %   RBC. 2.27 (L) 4.22 - 5.81 MIL/uL   Retic Count, Manual 79.5 19.0 - 186.0 K/uL    IMAGING: Dg Chest 2 View  09/20/2015   CLINICAL DATA:  Right-sided chest pain since 10/30 last night. Shortness of breath. History of hypertension.  EXAM: CHEST  2 VIEW  COMPARISON:  06/28/2014  FINDINGS: Postoperative changes in the  mediastinum. Cardiac enlargement. No pulmonary vascular congestion. There is infiltration or atelectasis in the left lung base behind the heart. No blunting of costophrenic angles. No pneumothorax. Mediastinal contours appear intact. Calcification of the aorta. Degenerative changes in the spine.  IMPRESSION: Infiltration or atelectasis in the left lung base behind the heart.   Electronically Signed   By: Lucienne Capers M.D.   On: 09/20/2015 06:30    EKG: Sinus rhythm, 60 bpm - borderline 1 AVB (PR interval 203.Marland Kitchen LAA. LVH with LAD (-48) and IVCD. Likely report abnormality. (Biphasic ST-T waves in V1 and V2 borderline in D3 are all relatively old.)  IMPRESSION: Principal Problem:   Chest pain Active Problems:   Atherosclerosis of native coronary artery-- s/p CABG   Chronic combined systolic and diastolic heart failure, NYHA class 2 (HCC)   S/P CABG x 3: LIMA-LAD, SVG-D2, SVG-rPDA   Essential hypertension  Acute on chronic renal failure (HCC)   Anemia due to chronic kidney disease   Ischemic cardiomyopathy   S/P AVR (aortic valve replacement)   Chronic kidney disease, stage III (moderate): Cr ~2.2 baseline   Diabetes mellitus type 2, controlled, with complications (HCC)   Candidiasis of mouth   Elevated troponin   Cough   Right sided, reproducible chest pain that is essentially reproducible with certain movements and deep inspiration. Quite likely musculoskeletal pain from his recent fall or cough.  Does not sound consistent with anginal pain.  Troponin elevation of 0.28 on initial evaluation in the setting of right-sided, and likely nonanginal chest pain and recent CHF exacerbation from volume overload post-op -> not sure which direction the troponin level trend. He does have existing coronary disease and profound anemia that is also worse than last week.      RECOMMENDATION:   For borderline troponin elevation, I would continue to cycle enzymes. He was started on IV heparin in the  emergency room. If the trend of troponin is on the way down, is most likely more consistent with demand ischemia from CHF versus an acute coronary syndrome. In that case, I would stop IV heparin.    troponin elevation would be slightly exacerbated by worsening renal function   recommend evaluating anemia. He doesn't a history of chronic anemia status post Procrit injections in the past.   based on the presence of existing worsening renal function and anemia as well as other potential comorbidities, I would recommend admitting to the internal medicine service with cardiology consultation. Would consider also either/or hematology/nephrology consultation.   If troponins trend down, would simply recheck echo. Chest x-ray showed no sign of pleural effusion. In the past he has had pneumonia that took a while to diagnose.  Would otherwise continue home cardiac/antihypertensives agents. No active heart failure symptoms. No sign of decompensation.  Check echocardiogram to ensure no worsening function.  At this time, would avoid ischemic evaluation until we have return of normal renal function and improved hemoglobin levels.   Time Spent Directly with Patient: 45 minutes   Tobby Fawcett, Leonie Green, M.D., M.S. Interventional Cardiologist   Pager # [phone]

## 2015-09-20 NOTE — ED Notes (Signed)
Pt c/o worsening chest pain following xray. MD made aware

## 2015-09-20 NOTE — ED Notes (Signed)
Patient transported to X-ray 

## 2015-09-20 NOTE — ED Notes (Signed)
Cardiology at bedside.

## 2015-09-20 NOTE — H&P (Deleted)
[facility] Medical Consultation  [name] [name] OZD:[id number] DOB: [birth year] DOA: 09/20/2015 PCP: [name] Kinnier, DO   Requesting physician: [name] [name] Date of consultation: 09/19/16 Reason for consultation: anemia, chest pain   Impression/Recommendations  Chest pain /  Elevated troponin There seems to be a musculoskeletal component to the right sided chest pain. Cardiac etiology certainly not excluded. He does have a mildly elevated troponin of 0.29.  No acute changes on EKG. Cardiology has evaluated ([name] [name]) and recommends admission. -Will admit to telemetry - IV heparin and echocardiogram per Cardiology recommendations.  -cycle troponins.   Essential hypertension. Stable. Will continue home antihypertensives / beta blockers  Acute on chronic anemia. Followed by hematology for anemia of chronic disease. Baseline hemoglobin mid 11 to mid 12 range . Patient is s/p hernia repair 09/13/15. Preop hemoglobin 12.5,  postoperative hemoglobin 9.7. Now with further decline in hemoglobin to 7.4. Operative report suggest only 50 cc blood loss . Patient initially described some dark and even black stools, wife disagrees with this.Hemoccult-negative today. Retroperitoneal bleed a remote possibility though patient has not been on any blood thinners.  -transfuse 2 units of blood -monitor H&H  Chronic kidney disease, stage III (moderate): Cr ~2.2 baseline.  Creatinine above baseline (2.67) since inguinal hernia repair 10/3. Patient's diurectics were temporarily increased last week after he had gained a few pounds postoperatively. Continue home Lasix   Cough. Yellow phlegm. No fevers. Chest x-ray suggests left lung base infiltrate versus atelectasis. Will hold off on starting her on probiotics at this point  CAD, s/p CABG / Chronic combined systolic and diastolic heart failure, NYHA class 2 ([name] [name]). Stable, no evidence for decompensation. Last echo April 2014 revealed ejection fraction of 40%.  For repeat echo today. Cardiology following this admission  S/P AVR (aortic valve replacement) in 2013.     Diabetes mellitus type 2, controlled, with complications. Hgb A1c 6.6 in March 2016. CBG in ED up to 230's. Will place on SSI,  recheck A1c.   Candidiasis of mouth. Endorses mild pain with swallowing. Treat with Magic mouthwash.    I will followup again tomorrow. Please contact me if I can be of assistance in the meanwhile. Thank you for this consultation.  Chief Complaint: Chest pain  HPI:  Patient is an 81 year old male who presents to the emergency department with right sided chest pain. Pain woke him up early this morning, it has been constant. Patient is also had hiccups since inguinal repair a week ago. The chest pain is worse during a hiccough.  Movement exacerbates the pain as well. Pain does not radiate to back or arms. No associated shortness of breath. His troponin is elevated but patient has acute on chronic kidney disease.  No overt GI bleeding. Appetite is poor. Endorses cough, yellow phlegm. No fevers.    In ED:  VSS. Troponin elevated, no acute changes on EKG. Cardiology evaluated and started patient on IV heparin. Patient found to have acute on chronic anemia, hemoglobin 7.4. He had a significant drop in hgb (12 >>>9.4) following inguinal hernia repair on 10/4.     Review of Systems:  Constitutional: Denies fever, chills, diaphoresis, appetite change and fatigue.  HEENT: Denies photophobia, eye pain, redness, hearing loss, ear pain, congestion, rhinorrhea, sneezing, mouth sores, trouble swallowing, neck pain, neck stiffness and tinnitus.  Respiratory: Denies SOB, , chest tightness, and wheezing.  Cardiovascular: Denies palpitations and leg swelling.  Gastrointestinal: Denies nausea, vomiting, abdominal pain, diarrhea, constipation, blood in stool and abdominal distention.  Genitourinary:  Denies dysuria, urgency, frequency, hematuria, flank pain and difficulty  urinating.  Musculoskeletal: Denies myalgias, back pain, joint swelling, arthralgias and gait problem.  Skin: Denies pallor, rash and wound.  Neurological: Denies dizziness, seizures, syncope, weakness, light-headedness, numbness and headaches.  Hematological: Denies adenopathy. Easy bruising, personal or family bleeding history  Psychiatric/Behavioral: Denies suicidal ideation, mood changes, confusion, nervousness, sleep disturbance and agitation    Past Medical History  Diagnosis Date  . History of colon cancer     s/p colon resection  . Aortic stenosis     a. s/p AVR with 107mm Edwards pericardial valve 02/07/12 - post-op course complicated by pleural effusion requring thoracentesis/leg cellulitis 03/2012.  Marland Kitchen CAD (coronary artery disease)     a. s/p NSTEMI 12/2011:  LHC - Ostial left main 20%, ostial LAD 50%, mid 60-70%, ostial D1 40% and mid 40%, D2 70%, ostial circumflex occluded, ostial RCA 80-90%, LVEDP was 42. b.  s/p CABG x 3 at time of AVR (LiMA-LAD, SVG-2nd daigonal, SVG-PDA) 02/07/12 (post-op course noted above).  . Ischemic cardiomyopathy   . Chronic systolic heart failure ([place])     a. TEE 01/2012: EF 25-30%, diffuse hypokinesis. b. Not on ACEI due to renal insufficiency.;  c. follow up  echo 08/06/12: EF 25%, mod diast dysfxn, AVR ok, mild MR, mod LAE, mild RAE, mild to mod RV systolic dysfunction  . HLD (hyperlipidemia)   . GERD (gastroesophageal reflux disease)   . Chronic kidney disease   . Pleural effusion     a. post-operatively after AVR/CABG s/p thoracentesis 03/2012 yielding 1L serosanguinous fluid.  . Diabetes mellitus     borderline  . Blood transfusion     NO REACTION TO TRANSFUSION  . Cellulitis     a. RLE cellulitis 2 months post-operatively after AVR/CABG - serratia marcessans, tx with I&D/antibiotics  . Mitral regurgitation     Moderate by TEE 01/2012  . Flash pulmonary edema ([place])     Post-cath 12/2011, went into acute pulm edema requiring IV lasix and intubation   . Baker's cyst 07/23/12    Incidental finding of LE venous dopplers  . Cataract     right eye, hx of  . HTN (hypertension)     primary, Dr. Hollace [name]  . Myocardial infarction ([place]) 12/2011  . CHF (congestive heart failure) ([place])   . Stroke ([place]) 10/01    RIght Leg weakness  . Cancer [facility]) '90's    Colon  . Anemia    Past Surgical History  Procedure Laterality Date  . Colon resection  1996  . Esophageal dilation    . Colonoscopy    . Cataract extraction      rt  . Cardiac catheterization      1.3.13  stopped breathing, put on ventilator for 5-6 days  . Tonsillectomy    . Aortic valve replacement  02/07/2012    Procedure: AORTIC VALVE REPLACEMENT (AVR);  Surgeon: Gaye Pollack, MD;  Location: [place];  Service: Open Heart Surgery;  Laterality: N/A;  . Coronary artery bypass graft  02/07/2012    Procedure: CORONARY ARTERY BYPASS GRAFTING (CABG);  Surgeon: Gaye Pollack, MD;  Location: [place];  Service: Open Heart Surgery;  Laterality: N/A;  CABG x three; using right leg greater saphenous vein harvested endoscopically  . Chest tube insertion  07/24/2012    Procedure: INSERTION PLEURAL DRAINAGE CATHETER;  Surgeon: Gaye Pollack, MD;  Location: [place];  Service: Thoracic;  Laterality: Left;  . Talc pleurodesis  08/30/2012  Procedure: Pietro Cassis;  Surgeon: Gaye Pollack, MD;  Location: [place];  Service: Thoracic;  Laterality: Left;  INSERTION OF TALC VIA LEFT PLEURX  . Talc pleurodesis  09/12/2012    Procedure: TALC PLEURADESIS;  Surgeon: Gaye Pollack, MD;  Location: [place];  Service: Thoracic;  Laterality: Left;  . Removal of pleural drainage catheter  09/27/2012    Procedure: REMOVAL OF PLEURAL DRAINAGE CATHETER;  Surgeon: Gaye Pollack, MD;  Location: [place];  Service: Thoracic;  Laterality: Left;  Marland Kitchen Eye surgery  2009    cataract removed from right eye  . Esophagogastroduodenoscopy N/A 07/03/2014    Procedure: ESOPHAGOGASTRODUODENOSCOPY (EGD);  Surgeon: Arta Silence, MD;   Location: [facility] ENDOSCOPY;  Service: Endoscopy;  Laterality: N/A;  . Esophagoscopy with dilitation N/A 07/07/2014    Procedure: ESOPHAGOSCOPY WITH DILITATION/SAVARY DILATOR;  Surgeon: Rozetta Nunnery, MD;  Location: [place];  Service: ENT;  Laterality: N/A;  . Ercp N/A 07/08/2014    Procedure: ENDOSCOPIC RETROGRADE CHOLANGIOPANCREATOGRAPHY (ERCP);  Surgeon: Missy Sabins, MD;  Location[facility] ENDOSCOPY;  Service: Endoscopy;  Laterality: N/A;  . Left heart catheterization with coronary angiogram N/A 12/13/2011    Procedure: LEFT HEART CATHETERIZATION WITH CORONARY ANGIOGRAM;  Surgeon: Peter M Martinique, MD;  Location: [facility] CATH LAB;  Service: Cardiovascular;  Laterality: N/A;  . Inguinal hernia repair Right 09/13/2015    Procedure: OPEN RIGHT INGUINAL HERNIA;  Surgeon: Mickeal Skinner, MD;  Location: [place];  Service: General;  Laterality: Right;  . Insertion of mesh Right 09/13/2015    Procedure: INSERTION OF MESH;  Surgeon: Mickeal Skinner, MD;  Location: [place];  Service: General;  Laterality: Right;   Social History:  reports that he has never smoked. He has never used smokeless tobacco. He reports that he does not drink alcohol or use illicit drugs.  Patient lives at home with wife. Still very functional- drives, manages finances, etc..   Allergies  Allergen Reactions  . Statins Other (See Comments)    Pain/weakness in legs   Family History  Problem Relation Age of Onset  . Cancer Mother   . Heart attack Father   . Mental illness Brother   . Kidney failure Brother     Prior to Admission medications   Medication Sig Start Date End Date Taking? Authorizing Provider  aspirin EC 325 MG tablet Take 325 mg by mouth daily.    Yes Historical Provider, MD  carvedilol (COREG) 25 MG tablet TAKE 1 TABLET BY MOUTH TWICE A DAY WITH A MEAL 08/03/15  Yes Jolaine Artist, MD  cholecalciferol (VITAMIN D) 1000 UNITS tablet Take 1,000 Units by mouth daily with supper.    Yes Historical Provider, MD   furosemide (LASIX) 40 MG tablet Take 2 tablets (80 mg total) by mouth daily. 02/08/15  Yes Jolaine Artist, MD  hydrALAZINE (APRESOLINE) 25 MG tablet TAKE 1 TABLET BY MOUTH 3 TIMES A DAY 07/12/15  Yes [name] L [name], DO  isosorbide mononitrate (IMDUR) 60 MG 24 hr tablet TAKE 1 TABLET BY MOUTH EVERY DAY 05/04/15  Yes Jolaine Artist, MD  Multiple Vitamin (MULTIVITAMIN WITH MINERALS) TABS tablet Take 1 tablet by mouth daily.   Yes Historical Provider, MD  omeprazole (PRILOSEC) 20 MG capsule Take 20 mg by mouth daily.    Yes Historical Provider, MD  oxyCODONE (OXY IR/ROXICODONE) 5 MG immediate release tablet Take 1-2 tablets (5-10 mg total) by mouth every 4 (four) hours as needed for moderate pain. 09/14/15  Yes  Arta Bruce Kinsinger, MD  Prenatal Vit-Fe Fumarate-FA (MULTIVITAMIN-PRENATAL) 27-0.8 MG TABS Take 1 tablet by mouth daily with supper.    Yes Historical Provider, MD  tamsulosin (FLOMAX) 0.4 MG CAPS capsule TAKE 1 CAPSULE BY MOUTH ONCE DAILY AFTER SUPPER 04/05/15  Yes [name] L [name], DO  Travoprost, BAK Free, (TRAVATAN) 0.004 % SOLN ophthalmic solution Place 1 drop into both eyes daily.    Yes Historical Provider, MD  AMBULATORY NON FORMULARY MEDICATION Potassium 40 meq Take one tablet by mouth once daily for 3 days Patient not taking: Reported on 09/20/2015 09/17/15   [name] L [name], DO  amoxicillin (AMOXIL) 500 MG capsule Take 4 capsules (2,000 mg total) by mouth See admin instructions. Take 4 capsules (2000 mg) prior to surgery or dental appointments 08/23/15   [name] L [name], DO  nitroGLYCERIN (NITROSTAT) 0.4 MG SL tablet Place 0.4 mg under the tongue every 5 (five) minutes as needed for chest pain.    Historical Provider, MD  Tdap (BOOSTRIX) 5-2.5-18.5 LF-MCG/0.5 injection Inject 0.5 mLs into the muscle once. Patient not taking: Reported on 09/20/2015 08/23/15   Gayland Curry, DO   Physical Exam: Blood pressure 120/103, pulse 56, temperature 98.7 F (37.1 C), temperature source Oral,  resp. rate 20, height 5\' 8"  (1.727 m), weight 71.9 kg (158 lb 8.2 oz), SpO2 97 %. Filed Vitals:   09/20/15 0800  BP: 120/103  Pulse: 56  Temp:   Resp: 20    Constitutional: Vital signs reviewed. Patient is a well-developed and well-nourished in no acute distress and cooperative with exam. Alert and oriented x3.  Head: Normocephalic and atraumatic  Ear: TM normal bilaterally  Mouth: no erythema or exudates, MMM  Eyes: PERRL, EOMI, conjunctivae normal, No scleral icterus.  Neck: Supple, Trachea midline normal ROM, No JVD, mass, thyromegaly, or carotid bruit present.  Cardiovascular: RRR, S1 normal, S2 normal, no MRG, pulses symmetric and intact bilaterally  Pulmonary/Chest: CTAB, no wheezes, rales, or rhonchi  Abdominal: Soft. Non-tender, non-distended, bowel sounds are normal, no masses, organomegaly, or guarding present.  GU: no CVA tenderness Musculoskeletal: No joint deformities, erythema, or stiffness, ROM full and no nontender Ext: no edema and no cyanosis, pulses palpable bilaterally (DP and PT)  Hematology: no cervical, inginal, or axillary adenopathy.  Neurological: A&O x3, Strenght is normal and symmetric bilaterally, cranial nerve II-XII are grossly intact, no focal motor deficit, sensory intact to light touch bilaterally.  Skin: Warm, dry and intact. No rash, cyanosis, or clubbing.  Psychiatric: Normal mood and affect. speech and behavior is normal. Judgment and thought content normal. Cognition and memory are normal.   Labs on Admission:  Basic Metabolic Panel:  Recent Labs Lab 09/13/15 1158 09/14/15 0505 09/20/15 0546  NA  --  137 137  K  --  4.5 4.4  CL  --  105 97*  CO2  --  28 29  GLUCOSE  --  185* 296*  BUN  --  35* 55*  CREATININE 2.17* 2.37* 2.67*  CALCIUM  --  8.2* 8.8*    CBC:  Recent Labs Lab 09/13/15 1158 09/14/15 0505 09/20/15 0546  WBC 8.2 5.6 8.6  NEUTROABS  --   --  6.8  HGB 9.7* 8.2* 7.4*  HCT 29.6* 25.7* 23.3*  MCV 100.0 100.8* 101.3*   PLT 118* 104* 180   Cardiac Enzymes:  Recent Labs Lab 09/20/15 0546  TROPONINI 0.28*   CBG:  Recent Labs Lab 09/13/15 0929 09/13/15 1240 09/13/15 1739 09/13/15 2133 09/14/15 [place]  180* 243* 190* 182* 168*    Radiological Exams on Admission: Dg Chest 2 View  09/20/2015   CLINICAL DATA:  Right-sided chest pain since 10/30 last night. Shortness of breath. History of hypertension.  EXAM: CHEST  2 VIEW  COMPARISON:  06/28/2014  FINDINGS: Postoperative changes in the mediastinum. Cardiac enlargement. No pulmonary vascular congestion. There is infiltration or atelectasis in the left lung base behind the heart. No blunting of costophrenic angles. No pneumothorax. Mediastinal contours appear intact. Calcification of the aorta. Degenerative changes in the spine.  IMPRESSION: Infiltration or atelectasis in the left lung base behind the heart.   Electronically Signed   By: Lucienne Capers M.D.   On: 09/20/2015 06:30    EKG: Sinus rhythm Probable left atrial enlargement LVH with IVCD, LAD and secondary repol abnrm When compared with ECG of 04/08/2013, No significant change was found  Time spent: 60 minutes  [facility] Pager [phone]  If 7PM-7AM, please contact night-coverage [email or web address] Password [email or web address] 09/20/2015, 8:48 AM

## 2015-09-20 NOTE — Progress Notes (Signed)
ANTICOAGULATION CONSULT NOTE - Initial Consult  Pharmacy Consult for heparin Indication: chest pain/ACS  Allergies  Allergen Reactions  . Statins Other (See Comments)    Pain/weakness in legs    Patient Measurements: Height: 5\' 8"  (172.7 cm) Weight: 158 lb 8.2 oz (71.9 kg) IBW/kg (Calculated) : 68.4 Heparin Dosing Weight: 71.9kg  Vital Signs: Temp: 98.7 F (37.1 C) (10/10 0539) Temp Source: Oral (10/10 0539) BP: 122/36 mmHg (10/10 0600) Pulse Rate: 60 (10/10 0600)  Labs:  Recent Labs  09/20/15 0546  HGB 7.4*  HCT 23.3*  PLT 180  CREATININE 2.67*  TROPONINI 0.28*    Estimated Creatinine Clearance: 18.9 mL/min (by C-G formula based on Cr of 2.67).   Medical History: Past Medical History  Diagnosis Date  . History of colon cancer     s/p colon resection  . Aortic stenosis     a. s/p AVR with 38mm Edwards pericardial valve 02/07/12 - post-op course complicated by pleural effusion requring thoracentesis/leg cellulitis 03/2012.  Marland Kitchen CAD (coronary artery disease)     a. s/p NSTEMI 12/2011:  LHC - Ostial left main 20%, ostial LAD 50%, mid 60-70%, ostial D1 40% and mid 40%, D2 70%, ostial circumflex occluded, ostial RCA 80-90%, LVEDP was 42. b.  s/p CABG x 3 at time of AVR (LiMA-LAD, SVG-2nd daigonal, SVG-PDA) 02/07/12 (post-op course noted above).  . Ischemic cardiomyopathy   . Chronic systolic heart failure ([place])     a. TEE 01/2012: EF 25-30%, diffuse hypokinesis. b. Not on ACEI due to renal insufficiency.;  c. follow up  echo 08/06/12: EF 25%, mod diast dysfxn, AVR ok, mild MR, mod LAE, mild RAE, mild to mod RV systolic dysfunction  . HLD (hyperlipidemia)   . GERD (gastroesophageal reflux disease)   . Chronic kidney disease   . Pleural effusion     a. post-operatively after AVR/CABG s/p thoracentesis 03/2012 yielding 1L serosanguinous fluid.  . Diabetes mellitus     borderline  . Blood transfusion     NO REACTION TO TRANSFUSION  . Cellulitis     a. RLE cellulitis 2  months post-operatively after AVR/CABG - serratia marcessans, tx with I&D/antibiotics  . Mitral regurgitation     Moderate by TEE 01/2012  . Flash pulmonary edema ([place])     Post-cath 12/2011, went into acute pulm edema requiring IV lasix and intubation  . Baker's cyst 07/23/12    Incidental finding of LE venous dopplers  . Cataract     right eye, hx of  . HTN (hypertension)     primary, Dr. Hollace Kinnier  . Myocardial infarction ([place]) 12/2011  . CHF (congestive heart failure) ([place])   . Stroke ([place]) 10/01    RIght Leg weakness  . Cancer [facility]) '90's    Colon  . Anemia     Medications:  Infusions:  . heparin    . nitroGLYCERIN      Assessment: 89 yom presented to the ED with CP and SOB. Troponin elevated. To start IV heparin for CP. Baseline H/H low and platelets are WNL. He is not on anticoagulation PTA.   Goal of Therapy:  Heparin level 0.3-0.7 units/ml Monitor platelets by anticoagulation protocol: Yes   Plan:  - Heparin bolus 4000 units IV x 1 - Heparin gtt 1000 units/hr - Check an 8 hour heparin level - Daily heparin level and CBC  Garrus Gauthreaux, Rande Lawman 09/20/2015,6:50 AM

## 2015-09-20 NOTE — ED Notes (Signed)
MD at bedside. 

## 2015-09-20 NOTE — ED Provider Notes (Signed)
CSN: [id number]     Arrival date & time 09/20/15  0528 History   First MD Initiated Contact with Patient 09/20/15 0532     Chief Complaint  Patient presents with  . Chest Pain     (Consider location/radiation/quality/duration/timing/severity/associated sxs/prior Treatment) Patient is a 81 y.o. male presenting with chest pain. The history is provided by the patient.  Chest Pain He complains of right-sided chest pain which has been present intermittently for the last several days. He woke up this morning with severe right-sided pain which he rated at 10/10. Nothing made it better nothing made it worse. Pain has since subsided. He describes it as a throbbing pain. There is mild associated dyspnea but no nausea or diaphoresis. He denies having had similar pains in the past.  Past Medical History  Diagnosis Date  . History of colon cancer     s/p colon resection  . Aortic stenosis     a. s/p AVR with 57mm Edwards pericardial valve 02/07/12 - post-op course complicated by pleural effusion requring thoracentesis/leg cellulitis 03/2012.  Marland Kitchen CAD (coronary artery disease)     a. s/p NSTEMI 12/2011:  LHC - Ostial left main 20%, ostial LAD 50%, mid 60-70%, ostial D1 40% and mid 40%, D2 70%, ostial circumflex occluded, ostial RCA 80-90%, LVEDP was 42. b.  s/p CABG x 3 at time of AVR (LiMA-LAD, SVG-2nd daigonal, SVG-PDA) 02/07/12 (post-op course noted above).  . Ischemic cardiomyopathy   . Chronic systolic heart failure ([place])     a. TEE 01/2012: EF 25-30%, diffuse hypokinesis. b. Not on ACEI due to renal insufficiency.;  c. follow up  echo 08/06/12: EF 25%, mod diast dysfxn, AVR ok, mild MR, mod LAE, mild RAE, mild to mod RV systolic dysfunction  . HLD (hyperlipidemia)   . GERD (gastroesophageal reflux disease)   . Chronic kidney disease   . Pleural effusion     a. post-operatively after AVR/CABG s/p thoracentesis 03/2012 yielding 1L serosanguinous fluid.  . Diabetes mellitus     borderline  . Blood  transfusion     NO REACTION TO TRANSFUSION  . Cellulitis     a. RLE cellulitis 2 months post-operatively after AVR/CABG - serratia marcessans, tx with I&D/antibiotics  . Mitral regurgitation     Moderate by TEE 01/2012  . Flash pulmonary edema ([place])     Post-cath 12/2011, went into acute pulm edema requiring IV lasix and intubation  . Baker's cyst 07/23/12    Incidental finding of LE venous dopplers  . Cataract     right eye, hx of  . HTN (hypertension)     primary, Dr. Hollace Kinnier  . Myocardial infarction ([place]) 12/2011  . CHF (congestive heart failure) ([place])   . Stroke ([place]) 10/01    RIght Leg weakness  . Cancer [facility]) '90's    Colon  . Anemia    Past Surgical History  Procedure Laterality Date  . Colon resection  1996  . Esophageal dilation    . Colonoscopy    . Cataract extraction      rt  . Cardiac catheterization      1.3.13  stopped breathing, put on ventilator for 5-6 days  . Tonsillectomy    . Aortic valve replacement  02/07/2012    Procedure: AORTIC VALVE REPLACEMENT (AVR);  Surgeon: Gaye Pollack, MD;  Location: [place];  Service: Open Heart Surgery;  Laterality: N/A;  . Coronary artery bypass graft  02/07/2012    Procedure: CORONARY ARTERY BYPASS GRAFTING (CABG);  Surgeon: Gaye Pollack, MD;  Location: [place];  Service: Open Heart Surgery;  Laterality: N/A;  CABG x three; using right leg greater saphenous vein harvested endoscopically  . Chest tube insertion  07/24/2012    Procedure: INSERTION PLEURAL DRAINAGE CATHETER;  Surgeon: Gaye Pollack, MD;  Location: [place];  Service: Thoracic;  Laterality: Left;  . Talc pleurodesis  08/30/2012    Procedure: TALC PLEURADESIS;  Surgeon: Gaye Pollack, MD;  Location: [place];  Service: Thoracic;  Laterality: Left;  INSERTION OF TALC VIA LEFT PLEURX  . Talc pleurodesis  09/12/2012    Procedure: TALC PLEURADESIS;  Surgeon: Gaye Pollack, MD;  Location: [place];  Service: Thoracic;  Laterality: Left;  . Removal of pleural drainage  catheter  09/27/2012    Procedure: REMOVAL OF PLEURAL DRAINAGE CATHETER;  Surgeon: Gaye Pollack, MD;  Location: [place];  Service: Thoracic;  Laterality: Left;  Marland Kitchen Eye surgery  2009    cataract removed from right eye  . Esophagogastroduodenoscopy N/A 07/03/2014    Procedure: ESOPHAGOGASTRODUODENOSCOPY (EGD);  Surgeon: Arta Silence, MD;  Location: [facility] ENDOSCOPY;  Service: Endoscopy;  Laterality: N/A;  . Esophagoscopy with dilitation N/A 07/07/2014    Procedure: ESOPHAGOSCOPY WITH DILITATION/SAVARY DILATOR;  Surgeon: Rozetta Nunnery, MD;  Location: [place];  Service: ENT;  Laterality: N/A;  . Ercp N/A 07/08/2014    Procedure: ENDOSCOPIC RETROGRADE CHOLANGIOPANCREATOGRAPHY (ERCP);  Surgeon: Missy Sabins, MD;  Location: [facility] ENDOSCOPY;  Service: Endoscopy;  Laterality: N/A;  . Left heart catheterization with coronary angiogram N/A 12/13/2011    Procedure: LEFT HEART CATHETERIZATION WITH CORONARY ANGIOGRAM;  Surgeon: Peter M Martinique, MD;  Location: [facility] CATH LAB;  Service: Cardiovascular;  Laterality: N/A;  . Inguinal hernia repair Right 09/13/2015    Procedure: OPEN RIGHT INGUINAL HERNIA;  Surgeon: Mickeal Skinner, MD;  Location: [place];  Service: General;  Laterality: Right;  . Insertion of mesh Right 09/13/2015    Procedure: INSERTION OF MESH;  Surgeon: Arta Bruce Kinsinger, MD;  Location: [place];  Service: General;  Laterality: Right;   Family History  Problem Relation Age of Onset  . Cancer Mother   . Heart attack Father   . Mental illness Brother   . Kidney failure Brother    Social History  Substance Use Topics  . Smoking status: Never Smoker   . Smokeless tobacco: Never Used  . Alcohol Use: No    Review of Systems  Cardiovascular: Positive for chest pain.  All other systems reviewed and are negative.     Allergies  Statins  Home Medications   Prior to Admission medications   Medication Sig Start Date End Date Taking? Authorizing Provider  AMBULATORY NON FORMULARY MEDICATION  Potassium 40 meq Take one tablet by mouth once daily for 3 days 09/17/15   [name] L [name], DO  amoxicillin (AMOXIL) 500 MG capsule Take 4 capsules (2,000 mg total) by mouth See admin instructions. Take 4 capsules (2000 mg) prior to surgery or dental appointments 08/23/15   Gayland Curry, DO  aspirin EC 325 MG tablet Take 325 mg by mouth daily.     Historical Provider, MD  carvedilol (COREG) 25 MG tablet TAKE 1 TABLET BY MOUTH TWICE A DAY WITH A MEAL 08/03/15   Jolaine Artist, MD  cholecalciferol (VITAMIN D) 1000 UNITS tablet Take 1,000 Units by mouth daily with supper.     Historical Provider, MD  furosemide (LASIX) 40 MG tablet Take 2 tablets (80 mg total)  by mouth daily. 02/08/15   Jolaine Artist, MD  hydrALAZINE (APRESOLINE) 25 MG tablet TAKE 1 TABLET BY MOUTH 3 TIMES A DAY 07/12/15   [name] L [name], DO  isosorbide mononitrate (IMDUR) 60 MG 24 hr tablet TAKE 1 TABLET BY MOUTH EVERY DAY 05/04/15   Jolaine Artist, MD  Multiple Vitamin (MULTIVITAMIN WITH MINERALS) TABS tablet Take 1 tablet by mouth daily.    Historical Provider, MD  nitroGLYCERIN (NITROSTAT) 0.4 MG SL tablet Place 0.4 mg under the tongue every 5 (five) minutes as needed for chest pain.    Historical Provider, MD  omeprazole (PRILOSEC) 20 MG capsule Take 20 mg by mouth daily.     Historical Provider, MD  oxyCODONE (OXY IR/ROXICODONE) 5 MG immediate release tablet Take 1-2 tablets (5-10 mg total) by mouth every 4 (four) hours as needed for moderate pain. 09/14/15   Mickeal Skinner, MD  Prenatal Vit-Fe Fumarate-FA (MULTIVITAMIN-PRENATAL) 27-0.8 MG TABS Take 1 tablet by mouth daily with supper.     Historical Provider, MD  tamsulosin (FLOMAX) 0.4 MG CAPS capsule TAKE 1 CAPSULE BY MOUTH ONCE DAILY AFTER SUPPER 04/05/15   [name] L [name], DO  Tdap ([place]) 5-2.5-18.5 LF-MCG/0.5 injection Inject 0.5 mLs into the muscle once. 08/23/15   [name] L [name], DO  Travoprost, BAK Free, (TRAVATAN) 0.004 % SOLN ophthalmic solution Place 1  drop into both eyes daily.     Historical Provider, MD   BP 124/57 mmHg  Pulse 64  Temp(Src) 98.7 F (37.1 C) (Oral)  Resp 26  SpO2 98% Physical Exam  Nursing note and vitals reviewed.  81 year old male, resting comfortably and in no acute distress. Vital signs are are significant for tachypnea. Oxygen saturation is 98%, which is normal. Head is normocephalic and atraumatic. PERRLA, EOMI. Oropharynx is clear. Neck is nontender and supple without adenopathy or JVD. Back is nontender and there is no CVA tenderness. Lungs are clear without rales, wheezes, or rhonchi. Chest is nontender. Heart has regular rate and rhythm with a 2/6 harsh systolic ejection murmur heard at the upper left sternal border. Abdomen is soft, flat, nontender without masses or hepatosplenomegaly and peristalsis is normoactive. Extremities have no cyanosis or edema, full range of motion is present. Skin is warm and dry without rash. Neurologic: Mental status is normal, cranial nerves are intact, there are no motor or sensory deficits.  ED Course  Procedures (including critical care time) Labs Review Results for orders placed or performed during the hospital encounter of 16/10/96  Basic metabolic panel  Result Value Ref Range   Sodium 137 135 - 145 mmol/L   Potassium 4.4 3.5 - 5.1 mmol/L   Chloride 97 (L) 101 - 111 mmol/L   CO2 29 22 - 32 mmol/L   Glucose, Bld 296 (H) 65 - 99 mg/dL   BUN 55 (H) 6 - 20 mg/dL   Creatinine, Ser 2.67 (H) 0.61 - 1.24 mg/dL   Calcium 8.8 (L) 8.9 - 10.3 mg/dL   GFR calc non Af Amer 20 (L) >60 mL/min   GFR calc Af Amer 23 (L) >60 mL/min   Anion gap 11 5 - 15  Troponin I  Result Value Ref Range   Troponin I 0.28 (H) <0.031 ng/mL  CBC with Differential  Result Value Ref Range   WBC 8.6 4.0 - 10.5 K/uL   RBC 2.30 (L) 4.22 - 5.81 MIL/uL   Hemoglobin 7.4 (L) 13.0 - 17.0 g/dL   HCT 23.3 (L) 39.0 - 52.0 %  MCV 101.3 (H) 78.0 - 100.0 fL   MCH 32.2 26.0 - 34.0 pg   MCHC 31.8 30.0  - 36.0 g/dL   RDW 13.9 11.5 - 15.5 %   Platelets 180 150 - 400 K/uL   Neutrophils Relative % 79 %   Neutro Abs 6.8 1.7 - 7.7 K/uL   Lymphocytes Relative 13 %   Lymphs Abs 1.1 0.7 - 4.0 K/uL   Monocytes Relative 8 %   Monocytes Absolute 0.7 0.1 - 1.0 K/uL   Eosinophils Relative 0 %   Eosinophils Absolute 0.0 0.0 - 0.7 K/uL   Basophils Relative 0 %   Basophils Absolute 0.0 0.0 - 0.1 K/uL    Imaging Review Dg Chest 2 View  09/20/2015   CLINICAL DATA:  Right-sided chest pain since 10/30 last night. Shortness of breath. History of hypertension.  EXAM: CHEST  2 VIEW  COMPARISON:  06/28/2014  FINDINGS: Postoperative changes in the mediastinum. Cardiac enlargement. No pulmonary vascular congestion. There is infiltration or atelectasis in the left lung base behind the heart. No blunting of costophrenic angles. No pneumothorax. Mediastinal contours appear intact. Calcification of the aorta. Degenerative changes in the spine.  IMPRESSION: Infiltration or atelectasis in the left lung base behind the heart.   Electronically Signed   By: Lucienne Capers M.D.   On: 09/20/2015 06:30   I have personally reviewed and evaluated these images and lab results as part of my medical decision-making.   EKG Interpretation   Date/Time:  Monday September 20 2015 05:36:17 EDT Ventricular Rate:  60 PR Interval:  203 QRS Duration: 120 QT Interval:  462 QTC Calculation: 462 R Axis:   -48 Text Interpretation:  Sinus rhythm Probable left atrial enlargement LVH  with IVCD, LAD and secondary repol abnrm When compared with ECG of  04/08/2013, No significant change was found Confirmed by [facility]  MD, Tayvin Preslar  ([id number]) on 09/20/2015 5:45:10 AM      CRITICAL CARE Performed by: JOACZ,YSAYT Total critical care time: 40 minutes Critical care time was exclusive of separately billable procedures and treating other patients. Critical care was necessary to treat or prevent imminent or life-threatening deterioration. Critical  care was time spent personally by me on the following activities: development of treatment plan with patient and/or surrogate as well as nursing, discussions with consultants, evaluation of patient's response to treatment, examination of patient, obtaining history from patient or surrogate, ordering and performing treatments and interventions, ordering and review of laboratory studies, ordering and review of radiographic studies, pulse oximetry and re-evaluation of patient's condition.  MDM   Final diagnoses:  Non-STEMI (non-ST elevated myocardial infarction) ([place])  Acute on chronic renal insufficiency ([facility])  Normochromic normocytic anemia    Chest pain which is somewhat atypical. Old records are reviewed and he has significant history of coronary disease and aortic stenosis. He is given a dose of aspirin and troponin level will be checked as well as ECG and chest x-ray.  He had recurrence of chest pain, so was given nitroglycerin SL. Troponin has come back elevated at 0.28. Anemia is present which is somewhat worse than baseline. Renal insufficiency is present which is also slightly worse than baseline. Case is discussed with Dr. Ellyn Hack of cardiology service who agrees to admit the patient.  Delora Fuel, MD 01/60/10 [id number]

## 2015-09-20 NOTE — Progress Notes (Signed)
Notified Dr. Marily Memos pt cont to be tachypnic, sats 94%. Pt having mult choking episodes with drinking from straw and swallowing pills. Increasing confusion. Temp 100. 1 tylenol given. 1st unit blood in process. Speech eval ordered and port chest ordered. Cont to monitor. Carroll Kinds RN

## 2015-09-20 NOTE — Consult Note (Signed)
Reason for Consult:CKD stage 4 Referring Physician: Marily Memos, MD  [name] [name] is an 81 y.o. male.  HPI: Pt is an 81yo M with and extensive PMH most significant for CAD s/p CABG x 3, ischemic cardiomyopathy (EF 30-35%), HTN, DM, colon cancer, Aortic stenosis s/p AVR, anemia, and CKD stage 4 with baseline Scr of 2-3 who was admitted with symptomatic anemia manifested as SSCP and elevated troponin levels.  We have been asked to see the patient as he has never seen a Nephrologist despite longstanding CKD with multiple complications including anemia of chronic disease.  Please see the trend in Scr below.  His acute drop in hgb was likely related to hernia repair on 09/13/15.  He has been appropriately managed by his PCP and Hematology regarding his advanced CKD and anemia of chronic disease.  He denies any N/V/D/SOB, melena, hematochezia, or BRBPR.    Trend in Creatinine:  CREATININE, SER  Date/Time Value Ref Range Status  09/20/2015 05:46 AM 2.67* 0.61 - 1.24 mg/dL Final  09/14/2015 05:05 AM 2.37* 0.61 - 1.24 mg/dL Final  09/13/2015 11:58 AM 2.17* 0.61 - 1.24 mg/dL Final  09/10/2015 03:58 PM 1.94* 0.61 - 1.24 mg/dL Final  02/15/2015 03:39 PM 2.18* 0.76 - 1.27 mg/dL Final  07/09/2014 06:45 AM 2.31* 0.50 - 1.35 mg/dL Final  07/08/2014 05:46 AM 2.27* 0.50 - 1.35 mg/dL Final  07/04/2014 04:40 AM 2.28* 0.50 - 1.35 mg/dL Final  07/03/2014 11:40 AM 2.37* 0.50 - 1.35 mg/dL Final  07/02/2014 08:35 AM 2.65* 0.50 - 1.35 mg/dL Final  06/30/2014 05:20 AM 2.82* 0.50 - 1.35 mg/dL Final  06/29/2014 05:33 AM 2.25* 0.50 - 1.35 mg/dL Final  06/28/2014 08:58 PM 2.08* 0.50 - 1.35 mg/dL Final  06/10/2014 11:48 AM 2.59* 0.76 - 1.27 mg/dL Final  03/19/2014 01:45 PM 2.16* 0.50 - 1.35 mg/dL Final  07/07/2013 04:26 PM 2.20* 0.76 - 1.27 mg/dL Final  06/19/2013 09:49 AM 2.3* 0.4 - 1.5 mg/dL Final  06/16/2013 01:27 PM 2.02* 0.50 - 1.35 mg/dL Final  05/28/2013 09:44 AM 2.37* 0.50 - 1.35 mg/dL Final  05/21/2013 02:46 PM  2.4* 0.4 - 1.5 mg/dL Final  04/15/2013 04:35 AM 2.94* 0.50 - 1.35 mg/dL Final  04/13/2013 05:55 AM 3.31* 0.50 - 1.35 mg/dL Final  04/12/2013 04:30 AM 3.35* 0.50 - 1.35 mg/dL Final  04/11/2013 04:32 AM 3.62* 0.50 - 1.35 mg/dL Final  04/09/2013 04:20 AM 3.22* 0.50 - 1.35 mg/dL Final  04/08/2013 06:55 AM 2.18* 0.50 - 1.35 mg/dL Final  04/08/2013 01:14 AM 2.19* 0.50 - 1.35 mg/dL Final  04/07/2013 05:02 PM 2.27* 0.50 - 1.35 mg/dL Final  03/03/2013 09:18 AM 2.16* 0.76 - 1.27 mg/dL Final  08/29/2012 02:43 PM 2.03* 0.50 - 1.35 mg/dL Final  08/14/2012 12:15 PM 2.1* 0.4 - 1.5 mg/dL Final  07/31/2012 08:42 AM 2.0* 0.4 - 1.5 mg/dL Final  07/26/2012 06:29 AM 1.87* 0.50 - 1.35 mg/dL Final  07/25/2012 06:00 AM 1.97* 0.50 - 1.35 mg/dL Final  07/24/2012 06:15 AM 2.00* 0.50 - 1.35 mg/dL Final  07/23/2012 11:30 AM 1.91* 0.50 - 1.35 mg/dL Final  07/23/2012 04:33 AM 1.93* 0.50 - 1.35 mg/dL Final  04/11/2012 06:35 AM 2.11* 0.50 - 1.35 mg/dL Final  04/08/2012 04:17 PM 1.90* 0.50 - 1.35 mg/dL Final  02/22/2012 12:50 PM 2.1* 0.4 - 1.5 mg/dL Final  02/15/2012 05:20 AM 1.86* 0.50 - 1.35 mg/dL Final  02/14/2012 05:37 AM 1.80* 0.50 - 1.35 mg/dL Final  02/13/2012 05:02 AM 1.74* 0.50 - 1.35 mg/dL Final  02/12/2012 05:00 AM 1.68*  0.50 - 1.35 mg/dL Final  02/11/2012 05:05 AM 1.99* 0.50 - 1.35 mg/dL Final  02/10/2012 06:55 AM 2.09* 0.50 - 1.35 mg/dL Final  02/09/2012 04:00 AM 1.95* 0.50 - 1.35 mg/dL Final  02/08/2012 05:30 PM 1.80* 0.50 - 1.35 mg/dL Final  02/08/2012 05:00 PM 1.95* 0.50 - 1.35 mg/dL Final  02/08/2012 04:00 AM 1.51* 0.50 - 1.35 mg/dL Final  02/07/2012 08:16 PM 1.63* 0.50 - 1.35 mg/dL Final  02/07/2012 08:12 PM 1.80* 0.50 - 1.35 mg/dL Final    PMH:   Past Medical History  Diagnosis Date  . History of colon cancer     s/p colon resection  . Aortic stenosis     a. s/p AVR with 6mm Edwards pericardial valve 02/07/12 - post-op course complicated by pleural effusion requring thoracentesis/leg  cellulitis 03/2012.  Marland Kitchen CAD (coronary artery disease)     a. s/p NSTEMI 12/2011:  LHC - Ostial left main 20%, ostial LAD 50%, mid 60-70%, ostial D1 40% and mid 40%, D2 70%, ostial circumflex occluded, ostial RCA 80-90%, LVEDP was 42. b.  s/p CABG x 3 at time of AVR (LiMA-LAD, SVG-2nd daigonal, SVG-PDA) 02/07/12 (post-op course noted above).  . Ischemic cardiomyopathy   . Chronic systolic heart failure ([place])     a. TEE 01/2012: EF 25-30%, diffuse hypokinesis. b. Not on ACEI due to renal insufficiency.;  c. follow up  echo 08/06/12: EF 25%, mod diast dysfxn, AVR ok, mild MR, mod LAE, mild RAE, mild to mod RV systolic dysfunction  . HLD (hyperlipidemia)   . GERD (gastroesophageal reflux disease)   . Chronic kidney disease   . Pleural effusion     a. post-operatively after AVR/CABG s/p thoracentesis 03/2012 yielding 1L serosanguinous fluid.  . Diabetes mellitus     borderline  . Blood transfusion     NO REACTION TO TRANSFUSION  . Cellulitis     a. RLE cellulitis 2 months post-operatively after AVR/CABG - serratia marcessans, tx with I&D/antibiotics  . Mitral regurgitation     Moderate by TEE 01/2012  . Flash pulmonary edema ([place])     Post-cath 12/2011, went into acute pulm edema requiring IV lasix and intubation  . Baker's cyst 07/23/12    Incidental finding of LE venous dopplers  . Cataract     right eye, hx of  . HTN (hypertension)     primary, Dr. Hollace Kinnier  . Myocardial infarction ([place]) 12/2011  . CHF (congestive heart failure) ([place])   . Stroke ([place]) 10/01    RIght Leg weakness  . Cancer [facility]) '90's    Colon  . Anemia     PSH:   Past Surgical History  Procedure Laterality Date  . Colon resection  1996  . Esophageal dilation    . Colonoscopy    . Cataract extraction      rt  . Cardiac catheterization      1.3.13  stopped breathing, put on ventilator for 5-6 days  . Tonsillectomy    . Aortic valve replacement  02/07/2012    Procedure: AORTIC VALVE REPLACEMENT (AVR);  Surgeon:  Gaye Pollack, MD;  Location: [place];  Service: Open Heart Surgery;  Laterality: N/A;  . Coronary artery bypass graft  02/07/2012    Procedure: CORONARY ARTERY BYPASS GRAFTING (CABG);  Surgeon: Gaye Pollack, MD;  Location: [place];  Service: Open Heart Surgery;  Laterality: N/A;  CABG x three; using right leg greater saphenous vein harvested endoscopically  . Chest tube insertion  07/24/2012  Procedure: INSERTION PLEURAL DRAINAGE CATHETER;  Surgeon: Gaye Pollack, MD;  Location: [place];  Service: Thoracic;  Laterality: Left;  . Talc pleurodesis  08/30/2012    Procedure: TALC PLEURADESIS;  Surgeon: Gaye Pollack, MD;  Location: [place];  Service: Thoracic;  Laterality: Left;  INSERTION OF TALC VIA LEFT PLEURX  . Talc pleurodesis  09/12/2012    Procedure: TALC PLEURADESIS;  Surgeon: Gaye Pollack, MD;  Location: [place];  Service: Thoracic;  Laterality: Left;  . Removal of pleural drainage catheter  09/27/2012    Procedure: REMOVAL OF PLEURAL DRAINAGE CATHETER;  Surgeon: Gaye Pollack, MD;  Location: [place];  Service: Thoracic;  Laterality: Left;  Marland Kitchen Eye surgery  2009    cataract removed from right eye  . Esophagogastroduodenoscopy N/A 07/03/2014    Procedure: ESOPHAGOGASTRODUODENOSCOPY (EGD);  Surgeon: Arta Silence, MD;  Location: [facility] ENDOSCOPY;  Service: Endoscopy;  Laterality: N/A;  . Esophagoscopy with dilitation N/A 07/07/2014    Procedure: ESOPHAGOSCOPY WITH DILITATION/SAVARY DILATOR;  Surgeon: Rozetta Nunnery, MD;  Location: [place];  Service: ENT;  Laterality: N/A;  . Ercp N/A 07/08/2014    Procedure: ENDOSCOPIC RETROGRADE CHOLANGIOPANCREATOGRAPHY (ERCP);  Surgeon: Missy Sabins, MD;  Location: [facility] ENDOSCOPY;  Service: Endoscopy;  Laterality: N/A;  . Left heart catheterization with coronary angiogram N/A 12/13/2011    Procedure: LEFT HEART CATHETERIZATION WITH CORONARY ANGIOGRAM;  Surgeon: Peter M Martinique, MD;  Location: [facility] CATH LAB;  Service: Cardiovascular;  Laterality: N/A;  . Inguinal hernia  repair Right 09/13/2015    Procedure: OPEN RIGHT INGUINAL HERNIA;  Surgeon: Mickeal Skinner, MD;  Location: [place];  Service: General;  Laterality: Right;  . Insertion of mesh Right 09/13/2015    Procedure: INSERTION OF MESH;  Surgeon: Mickeal Skinner, MD;  Location: [place];  Service: General;  Laterality: Right;    Allergies:  Allergies  Allergen Reactions  . Statins Other (See Comments)    Pain/weakness in legs    Medications:   Prior to Admission medications   Medication Sig Start Date End Date Taking? Authorizing Provider  aspirin EC 325 MG tablet Take 325 mg by mouth daily.    Yes Historical Provider, MD  carvedilol (COREG) 25 MG tablet TAKE 1 TABLET BY MOUTH TWICE A DAY WITH A MEAL 08/03/15  Yes Jolaine Artist, MD  cholecalciferol (VITAMIN D) 1000 UNITS tablet Take 1,000 Units by mouth daily with supper.    Yes Historical Provider, MD  furosemide (LASIX) 40 MG tablet Take 2 tablets (80 mg total) by mouth daily. 02/08/15  Yes Jolaine Artist, MD  hydrALAZINE (APRESOLINE) 25 MG tablet TAKE 1 TABLET BY MOUTH 3 TIMES A DAY 07/12/15  Yes [name] L [name], DO  isosorbide mononitrate (IMDUR) 60 MG 24 hr tablet TAKE 1 TABLET BY MOUTH EVERY DAY 05/04/15  Yes Jolaine Artist, MD  Multiple Vitamin (MULTIVITAMIN WITH MINERALS) TABS tablet Take 1 tablet by mouth daily.   Yes Historical Provider, MD  omeprazole (PRILOSEC) 20 MG capsule Take 20 mg by mouth daily.    Yes Historical Provider, MD  oxyCODONE (OXY IR/ROXICODONE) 5 MG immediate release tablet Take 1-2 tablets (5-10 mg total) by mouth every 4 (four) hours as needed for moderate pain. 09/14/15  Yes Arta Bruce Kinsinger, MD  Prenatal Vit-Fe Fumarate-FA (MULTIVITAMIN-PRENATAL) 27-0.8 MG TABS Take 1 tablet by mouth daily with supper.    Yes Historical Provider, MD  tamsulosin (FLOMAX) 0.4 MG CAPS capsule TAKE 1 CAPSULE BY MOUTH ONCE DAILY  AFTER SUPPER 04/05/15  Yes [name] L [name], DO  Travoprost, BAK Free, (TRAVATAN) 0.004 % SOLN  ophthalmic solution Place 1 drop into both eyes daily.    Yes Historical Provider, MD  AMBULATORY NON FORMULARY MEDICATION Potassium 40 meq Take one tablet by mouth once daily for 3 days Patient not taking: Reported on 09/20/2015 09/17/15   [name] L [name], DO  amoxicillin (AMOXIL) 500 MG capsule Take 4 capsules (2,000 mg total) by mouth See admin instructions. Take 4 capsules (2000 mg) prior to surgery or dental appointments 08/23/15   [name] L [name], DO  nitroGLYCERIN (NITROSTAT) 0.4 MG SL tablet Place 0.4 mg under the tongue every 5 (five) minutes as needed for chest pain.    Historical Provider, MD  Tdap (BOOSTRIX) 5-2.5-18.5 LF-MCG/0.5 injection Inject 0.5 mLs into the muscle once. Patient not taking: Reported on 09/20/2015 08/23/15   Gayland Curry, DO    Inpatient medications: . aspirin  324 mg Oral Once  . carvedilol  25 mg Oral BID WC  . [START ON 09/21/2015] furosemide  80 mg Oral Daily  . hydrALAZINE  25 mg Oral TID  . isosorbide mononitrate  60 mg Oral Daily  . latanoprost  1 drop Both Eyes QHS  . magic mouthwash  10 mL Oral QID  . pantoprazole  40 mg Oral Q0600  . sodium chloride  3 mL Intravenous Q12H  . tamsulosin  0.4 mg Oral Daily    Discontinued Meds:  There are no discontinued medications.  Social History:  reports that he has never smoked. He has never used smokeless tobacco. He reports that he does not drink alcohol or use illicit drugs.  Family History:   Family History  Problem Relation Age of Onset  . Cancer Mother   . Heart attack Father   . Mental illness Brother   . Kidney failure Brother     Pertinent items are noted in HPI. Weight change:  No intake or output data in the 24 hours ending 09/20/15 1009 BP 142/50 mmHg  Pulse 61  Temp(Src) 98.7 F (37.1 C) (Oral)  Resp 18  Ht 5\' 8"  (1.727 m)  Wt 71.9 kg (158 lb 8.2 oz)  BMI 24.11 kg/m2  SpO2 100% Filed Vitals:   09/20/15 0800 09/20/15 0900 09/20/15 0901 09/20/15 1007  BP: 120/103 130/65 130/65  142/50  Pulse: 56 54 62 61  Temp:      TempSrc:      Resp: 20 22 18 18   Height:      Weight:      SpO2: 97% 100% 99% 100%     General appearance: appears stated age, fatigued, pale and frail Head: Normocephalic, without obvious abnormality, atraumatic Eyes: negative findings: lids and lashes normal, conjunctivae and sclerae normal and corneas clear Neck: no adenopathy, no carotid bruit, no JVD, supple, symmetrical, trachea midline and thyroid not enlarged, symmetric, no tenderness/mass/nodules Cardio: regular rate and rhythm and no rub GI: soft, non-tender; bowel sounds normal; no masses,  no organomegaly Extremities: edema minimal pretibial edema  Labs: Basic Metabolic Panel:  Recent Labs Lab 09/13/15 1158 09/14/15 0505 09/20/15 0546  NA  --  137 137  K  --  4.5 4.4  CL  --  105 97*  CO2  --  28 29  GLUCOSE  --  185* 296*  BUN  --  35* 55*  CREATININE 2.17* 2.37* 2.67*  CALCIUM  --  8.2* 8.8*   Liver Function Tests: No results for input(s): AST, ALT, ALKPHOS,  BILITOT, PROT, ALBUMIN in the last 168 hours. No results for input(s): LIPASE, AMYLASE in the last 168 hours. No results for input(s): AMMONIA in the last 168 hours. CBC:  Recent Labs Lab 09/13/15 1158 09/14/15 0505 09/20/15 0546  WBC 8.2 5.6 8.6  NEUTROABS  --   --  6.8  HGB 9.7* 8.2* 7.4*  HCT 29.6* 25.7* 23.3*  MCV 100.0 100.8* 101.3*  PLT 118* 104* 180   PT/INR: @LABRCNTIP (inr:5) Cardiac Enzymes: ) Recent Labs Lab 09/20/15 0546  TROPONINI 0.28*   CBG:  Recent Labs Lab 09/13/15 1240 09/13/15 1739 09/13/15 2133 09/14/15 0723  GLUCAP 243* 190* 182* 168*    Iron Studies: No results for input(s): IRON, TIBC, TRANSFERRIN, FERRITIN in the last 168 hours.  Xrays/Other Studies: Dg Chest 2 View  09/20/2015   CLINICAL DATA:  Right-sided chest pain since 10/30 last night. Shortness of breath. History of hypertension.  EXAM: CHEST  2 VIEW  COMPARISON:  06/28/2014  FINDINGS: Postoperative  changes in the mediastinum. Cardiac enlargement. No pulmonary vascular congestion. There is infiltration or atelectasis in the left lung base behind the heart. No blunting of costophrenic angles. No pneumothorax. Mediastinal contours appear intact. Calcification of the aorta. Degenerative changes in the spine.  IMPRESSION: Infiltration or atelectasis in the left lung base behind the heart.   Electronically Signed   By: Lucienne Capers M.D.   On: 09/20/2015 06:30     Assessment/Plan: 1.  AKI/CKD stage 4- in setting of symptomatic anemia.  Overall not a significant change in his overall kidney function and he has had longstanding CKD stage 4 and has been appropriately managed by his PCP and Dr. Alen Blew.  He is not a suitable candidate for renal replacement therapy, which may be why he was never referred to our clinic.  Will continue to follow his UOP, daily Scr, and weight.   2. Acute on chronic anemia- likely related to his recent surgery.  Agree with blood transfusion given his symptoms and follow H/H.  Cont to follow up with Dr. Alen Blew as an outpt. 3. Chronic systolic CHF due to ICMP- followed by Dr. Haroldine Laws.  No changes for now. 4. CAD s/p CABG now with SSCP and + troponins in setting of acute anemia- cardiology following 5. HTN- BP meds were on hold initially but now BP is starting to climb.  Okay to resume outpt medications. 6. DM- per primary 7. Secondary HPTH- will check phos and iPTH    Galia Rahm A 09/20/2015, 10:09 AM

## 2015-09-20 NOTE — ED Notes (Signed)
Per EMS Pt started having right side chest pain that feels like throbbing just be for going to bed last night at 2230; pt c/o some SOB but sats at 96%RA; pt a&ox 4; Pt has hx of Cardiac problems; pt recent hernia surgery about 2-3 weeks ago; Pt has hx of MI x 2 years ago; pt has 2 x nitro resulting to 1/10; pt also had 325 mg of ASA in route;

## 2015-09-20 NOTE — H&P (Signed)
[facility] History and Physical  [name] WPY:[id number] DOB: 06-17-1926 DOA: 09/20/2015  Referring physician: Dr. Venora Maples PCP: Hollace Kinnier, DO   Chief Complaint: chest pain  HPI: [name] [name] is a 81 y.o. male with multiple medical problems not limited to CAD, ischemic cardiomyopathy, CKD, anemia of chronic disease and colon cancer, s/p resection. He presents to the emergency department with non-radiating right sided chest pain Pain woke him during the night. Pain has been constant since onset. Patient is also had hiccups since inguinal repair a week ago and the chest pain is worse during a hiccough. Movement exacerbates the pain as well. No associated shortness of breath. His troponin is elevated but patient has acute on chronic kidney disease.  Appetite has been poor since hernia repair a week ago.  Endorses cough, yellow phlegm. No fevers.   In ED:  VSS. Troponin elevated, no acute changes on EKG. Cardiology evaluated and started patient on IV heparin. Patient found to have acute on chronic anemia, hemoglobin 7.4. He had a significant drop in hgb (12 >>>9.4) following inguinal hernia repair on 10/4.   Review of Systems:  Constitutional:  No weight loss, night sweats, Fevers, chills, fatigue.  HEENT:  No headaches, Difficulty swallowing,Tooth/dental problems,  No sneezing, itching, ear ache, nasal congestion, post nasal drip,  Cardio-vascular:  No Orthopnea, PND, swelling in lower extremities, anasarca, dizziness, palpitations  GI:  No heartburn, indigestion, abdominal pain, nausea, vomiting, diarrhea, change in bowel habits, loss of appetite  Resp:  No shortness of breath with exertion or at rest. No excess mucus, No coughing up of blood.No change in color of mucus.No wheezing.No chest wall deformity  Skin:  no rash or lesions.  GU:  no dysuria, change in color of urine, no urgency or frequency. No flank pain.  Musculoskeletal:  No joint pain or swelling.  No decreased range of motion. No back pain.  Psych:  No change in mood or affect. No depression or anxiety. No memory loss.   Past Medical History  Diagnosis Date  . History of colon cancer     s/p colon resection  . Aortic stenosis     a. s/p AVR with 31mm Edwards pericardial valve 02/07/12 - post-op course complicated by pleural effusion requring thoracentesis/leg cellulitis 03/2012.  Marland Kitchen CAD (coronary artery disease)     a. s/p NSTEMI 12/2011:  LHC - Ostial left main 20%, ostial LAD 50%, mid 60-70%, ostial D1 40% and mid 40%, D2 70%, ostial circumflex occluded, ostial RCA 80-90%, LVEDP was 42. b.  s/p CABG x 3 at time of AVR (LiMA-LAD, SVG-2nd daigonal, SVG-PDA) 02/07/12 (post-op course noted above).  . Ischemic cardiomyopathy   . Chronic systolic heart failure ([place])     a. TEE 01/2012: EF 25-30%, diffuse hypokinesis. b. Not on ACEI due to renal insufficiency.;  c. follow up  echo 08/06/12: EF 25%, mod diast dysfxn, AVR ok, mild MR, mod LAE, mild RAE, mild to mod RV systolic dysfunction  . HLD (hyperlipidemia)   . GERD (gastroesophageal reflux disease)   . Chronic kidney disease   . Pleural effusion     a. post-operatively after AVR/CABG s/p thoracentesis 03/2012 yielding 1L serosanguinous fluid.  . Diabetes mellitus     borderline  . Blood transfusion     NO REACTION TO TRANSFUSION  . Cellulitis     a. RLE cellulitis 2 months post-operatively after AVR/CABG - serratia marcessans, tx with I&D/antibiotics  . Mitral regurgitation     Moderate by TEE 01/2012  .  Flash pulmonary edema ([place])     Post-cath 12/2011, went into acute pulm edema requiring IV lasix and intubation  . Baker's cyst 07/23/12    Incidental finding of LE venous dopplers  . Cataract     right eye, hx of  . HTN (hypertension)     primary, Dr. Hollace Kinnier  . Myocardial infarction ([place]) 12/2011  . CHF (congestive heart failure) ([place])   . Stroke ([place]) 10/01    RIght Leg weakness  . Cancer [facility]) '90's    Colon  . Anemia     Past Surgical History  Procedure Laterality Date  . Colon resection  1996  . Esophageal dilation    . Colonoscopy    . Cataract extraction      rt  . Cardiac catheterization      1.3.13  stopped breathing, put on ventilator for 5-6 days  . Tonsillectomy    . Aortic valve replacement  02/07/2012    Procedure: AORTIC VALVE REPLACEMENT (AVR);  Surgeon: Gaye Pollack, MD;  Location: [place];  Service: Open Heart Surgery;  Laterality: N/A;  . Coronary artery bypass graft  02/07/2012    Procedure: CORONARY ARTERY BYPASS GRAFTING (CABG);  Surgeon: Gaye Pollack, MD;  Location: [place];  Service: Open Heart Surgery;  Laterality: N/A;  CABG x three; using right leg greater saphenous vein harvested endoscopically  . Chest tube insertion  07/24/2012    Procedure: INSERTION PLEURAL DRAINAGE CATHETER;  Surgeon: Gaye Pollack, MD;  Location: [place];  Service: Thoracic;  Laterality: Left;  . Talc pleurodesis  08/30/2012    Procedure: TALC PLEURADESIS;  Surgeon: Gaye Pollack, MD;  Location: [place];  Service: Thoracic;  Laterality: Left;  INSERTION OF TALC VIA LEFT PLEURX  . Talc pleurodesis  09/12/2012    Procedure: TALC PLEURADESIS;  Surgeon: Gaye Pollack, MD;  Location: [place];  Service: Thoracic;  Laterality: Left;  . Removal of pleural drainage catheter  09/27/2012    Procedure: REMOVAL OF PLEURAL DRAINAGE CATHETER;  Surgeon: Gaye Pollack, MD;  Location: [place];  Service: Thoracic;  Laterality: Left;  Marland Kitchen Eye surgery  2009    cataract removed from right eye  . Esophagogastroduodenoscopy N/A 07/03/2014    Procedure: ESOPHAGOGASTRODUODENOSCOPY (EGD);  Surgeon: Arta Silence, MD;  Location: [facility] ENDOSCOPY;  Service: Endoscopy;  Laterality: N/A;  . Esophagoscopy with dilitation N/A 07/07/2014    Procedure: ESOPHAGOSCOPY WITH DILITATION/SAVARY DILATOR;  Surgeon: Rozetta Nunnery, MD;  Location: [place];  Service: ENT;  Laterality: N/A;  . Ercp N/A 07/08/2014    Procedure: ENDOSCOPIC RETROGRADE  CHOLANGIOPANCREATOGRAPHY (ERCP);  Surgeon: Missy Sabins, MD;  Location: [facility] ENDOSCOPY;  Service: Endoscopy;  Laterality: N/A;  . Left heart catheterization with coronary angiogram N/A 12/13/2011    Procedure: LEFT HEART CATHETERIZATION WITH CORONARY ANGIOGRAM;  Surgeon: Peter M Martinique, MD;  Location: [facility] CATH LAB;  Service: Cardiovascular;  Laterality: N/A;  . Inguinal hernia repair Right 09/13/2015    Procedure: OPEN RIGHT INGUINAL HERNIA;  Surgeon: Mickeal Skinner, MD;  Location: [place];  Service: General;  Laterality: Right;  . Insertion of mesh Right 09/13/2015    Procedure: INSERTION OF MESH;  Surgeon: Mickeal Skinner, MD;  Location: [place];  Service: General;  Laterality: Right;   Social History:  reports that he has never smoked. He has never used smokeless tobacco. He reports that he does not drink alcohol or use illicit drugs.  Allergies  Allergen Reactions  . Statins  Other (See Comments)    Pain/weakness in legs    Family History  Problem Relation Age of Onset  . Cancer Mother   . Heart attack Father   . Mental illness Brother   . Kidney failure Brother     Prior to Admission medications   Medication Sig Start Date End Date Taking? Authorizing Provider  aspirin EC 325 MG tablet Take 325 mg by mouth daily.    Yes Historical Provider, MD  carvedilol (COREG) 25 MG tablet TAKE 1 TABLET BY MOUTH TWICE A DAY WITH A MEAL 08/03/15  Yes Jolaine Artist, MD  cholecalciferol (VITAMIN D) 1000 UNITS tablet Take 1,000 Units by mouth daily with supper.    Yes Historical Provider, MD  furosemide (LASIX) 40 MG tablet Take 2 tablets (80 mg total) by mouth daily. 02/08/15  Yes Jolaine Artist, MD  hydrALAZINE (APRESOLINE) 25 MG tablet TAKE 1 TABLET BY MOUTH 3 TIMES A DAY 07/12/15  Yes [name] L [name], DO  isosorbide mononitrate (IMDUR) 60 MG 24 hr tablet TAKE 1 TABLET BY MOUTH EVERY DAY 05/04/15  Yes Jolaine Artist, MD  Multiple Vitamin (MULTIVITAMIN WITH MINERALS) TABS tablet Take 1  tablet by mouth daily.   Yes Historical Provider, MD  omeprazole (PRILOSEC) 20 MG capsule Take 20 mg by mouth daily.    Yes Historical Provider, MD  oxyCODONE (OXY IR/ROXICODONE) 5 MG immediate release tablet Take 1-2 tablets (5-10 mg total) by mouth every 4 (four) hours as needed for moderate pain. 09/14/15  Yes Arta Bruce Kinsinger, MD  Prenatal Vit-Fe Fumarate-FA (MULTIVITAMIN-PRENATAL) 27-0.8 MG TABS Take 1 tablet by mouth daily with supper.    Yes Historical Provider, MD  tamsulosin (FLOMAX) 0.4 MG CAPS capsule TAKE 1 CAPSULE BY MOUTH ONCE DAILY AFTER SUPPER 04/05/15  Yes [name] L [name], DO  Travoprost, BAK Free, (TRAVATAN) 0.004 % SOLN ophthalmic solution Place 1 drop into both eyes daily.    Yes Historical Provider, MD  AMBULATORY NON FORMULARY MEDICATION Potassium 40 meq Take one tablet by mouth once daily for 3 days Patient not taking: Reported on 09/20/2015 09/17/15   [name] L [name], DO  amoxicillin (AMOXIL) 500 MG capsule Take 4 capsules (2,000 mg total) by mouth See admin instructions. Take 4 capsules (2000 mg) prior to surgery or dental appointments 08/23/15   [name] L [name], DO  nitroGLYCERIN (NITROSTAT) 0.4 MG SL tablet Place 0.4 mg under the tongue every 5 (five) minutes as needed for chest pain.    Historical Provider, MD  Tdap (BOOSTRIX) 5-2.5-18.5 LF-MCG/0.5 injection Inject 0.5 mLs into the muscle once. Patient not taking: Reported on 09/20/2015 08/23/15   Gayland Curry, DO   Physical Exam: Filed Vitals:   09/20/15 0800 09/20/15 0900 09/20/15 0901 09/20/15 1007  BP: 120/103 130/65 130/65 142/50  Pulse: 56 54 62 61  Temp:      TempSrc:      Resp: 20 22 18 18   Height:      Weight:      SpO2: 97% 100% 99% 100%    Wt Readings from Last 3 Encounters:  09/20/15 71.9 kg (158 lb 8.2 oz)  09/13/15 71.923 kg (158 lb 9 oz)  09/10/15 71.94 kg (158 lb 9.6 oz)    General:  Pleasant white male. Appears calm and comfortable Eyes: PERRL, normal lids, irises & conjunctiva ENT:  grossly normal hearing, white coating on tongue.  Neck: no LAD, masses or thyromegaly Cardiovascular: bradycardic. HR in 50's. Regular rhythm. No LE edema.  Respiratory:  CTA bilaterally, no w/r/r. Normal respiratory effort. Abdomen: soft, ntnd Skin: no rash or induration seen on limited exam Musculoskeletal: grossly normal tone BUE/BLE Psychiatric: grossly normal mood and affect, speech fluent and appropriate Neurologic: grossly non-focal.          Labs on Admission:  Basic Metabolic Panel:  Recent Labs Lab 09/13/15 1158 09/14/15 0505 09/20/15 0546  NA  --  137 137  K  --  4.5 4.4  CL  --  105 97*  CO2  --  28 29  GLUCOSE  --  185* 296*  BUN  --  35* 55*  CREATININE 2.17* 2.37* 2.67*  CALCIUM  --  8.2* 8.8*    CBC:  Recent Labs Lab 09/13/15 1158 09/14/15 0505 09/20/15 0546  WBC 8.2 5.6 8.6  NEUTROABS  --   --  6.8  HGB 9.7* 8.2* 7.4*  HCT 29.6* 25.7* 23.3*  MCV 100.0 100.8* 101.3*  PLT 118* 104* 180   Cardiac Enzymes:  Recent Labs Lab 09/20/15 0546  TROPONINI 0.28*    CBG:  Recent Labs Lab 09/13/15 1240 09/13/15 1739 09/13/15 2133 09/14/15 0723  GLUCAP 243* 190* 182* 168*    Radiological Exams on Admission: Dg Chest 2 View  09/20/2015   CLINICAL DATA:  Right-sided chest pain since 10/30 last night. Shortness of breath. History of hypertension.  EXAM: CHEST  2 VIEW  COMPARISON:  06/28/2014  FINDINGS: Postoperative changes in the mediastinum. Cardiac enlargement. No pulmonary vascular congestion. There is infiltration or atelectasis in the left lung base behind the heart. No blunting of costophrenic angles. No pneumothorax. Mediastinal contours appear intact. Calcification of the aorta. Degenerative changes in the spine.  IMPRESSION: Infiltration or atelectasis in the left lung base behind the heart.   Electronically Signed   By: Lucienne Capers M.D.   On: 09/20/2015 06:30    EKG: Sinus rhythm Probable left atrial enlargement LVH with IVCD, LAD  and secondary repol abnrm When compared with ECG of 04/08/2013, No significant change was found  Assessment/Plan Principal Problem:   Chest pain Active Problems:   Essential hypertension   Acute on chronic renal failure (HCC)   Anemia due to chronic kidney disease   Atherosclerosis of native coronary artery-- s/p CABG   Chronic combined systolic and diastolic heart failure, NYHA class 2 (HCC)   Ischemic cardiomyopathy   S/P AVR (aortic valve replacement)   Chronic kidney disease, stage III (moderate): Cr ~2.2 baseline   Diabetes mellitus type 2, controlled, with complications (HCC)   S/P CABG x 3: LIMA-LAD, SVG-D2, SVG-rPDA   Candidiasis of mouth   Elevated troponin   Cough   Chest pain / Elevated troponin There seems to be a musculoskeletal component to the right sided chest pain. Cardiac etiology certainly not excluded. He does have a mildly elevated troponin of 0.29. No acute changes on EKG. Cardiology has evaluated (Dr. Ellyn Hack) and recommends admission. -Will admit to telemetry - IV heparin and echocardiogram per Cardiology recommendations.  -cycle troponins.   Essential hypertension. Stable. Will continue home antihypertensives / beta blockers  Acute on chronic anemia. Followed by hematology for anemia of chronic disease. Baseline hemoglobin mid 11 to mid 12 range . Patient is s/p hernia repair 09/13/15. Preop hemoglobin 12.5, postoperative hemoglobin 9.7. Now with further decline in hemoglobin to 7.4. Operative report suggest only 50 cc blood loss . Patient initially described some dark and even black stools, wife disagrees with this.Hemoccult-negative today. Retroperitoneal bleed a remote possibility though patient has not been  on any blood thinners.  -transfuse 2 units of blood -monitor H&H  Chronic kidney disease, stage III (moderate): Cr ~2.2 baseline. Creatinine above baseline (2.67) since inguinal hernia repair 10/3. Patient's diurectics were temporarily increased  last week after he had gained a few pounds postoperatively. Nephrology consult placed - spoke with Dr. Marval Regal.  Cough. Yellow phlegm. No fevers. Chest x-ray suggests left lung base infiltrate versus atelectasis. Will hold off on starting antibiotics at this point  CAD, s/p CABG / Chronic combined systolic and diastolic heart failure, NYHA class 2 ([place]). Stable, no evidence for decompensation. Last echo April 2014 revealed ejection fraction of 40%. For repeat echo today. Cardiology following this admission. Continue home diuretics, beta blockers  S/P AVR (aortic valve replacement) in 2013.    Diabetes mellitus type 2, controlled, with complications. Hgb A1c 6.6 in March 2016. CBG in ED up to 230's. Will place on SSI, recheck A1c.   Hiccups:  - PPI  - if not improving then consider thorazine or baclofen.  - Consider CT as outpt if continues after discharge.   Candidiasis of mouth. Endorses mild pain with swallowing. Treat with Magic mouthwash  Consults: Cardiology. Recommends IV heparin, echocardiogram, admission Nephrology consult pending  Code Status: DNR Family Communication: Patient alert, oriented and understands plan of care.  Disposition Plan: home in 48-72 hours.   Time spent: 60 minutes  Tye Savoy, NP [facility] Pager [phone]    Attending MD note  Patient was seen, examined,treatment plan was discussed with the  Advance Practice Provider.  I have personally reviewed the clinical findings, lab,EKG, imaging studies and management of this patient in detail.I have also reviewed the orders written for this patient which were under my direction. I agree with the documentation, as recorded by the Advance Practice Provider.   [name] [name] is a 81 y.o. male with a Past Medical History of  colon cancer, CAD, HLD, GERD, C KD, prediabetes, previous MI and CVA, hypertension, who presents with right-sided chest pain and hiccups. Patient also states that he had  a 7.9 pound weight gain ever since his surgery one week ago for which he took a double dose of Lasix for several days to get back to his baseline weight. At this time will transfuse patient even symptomatic anemia with evidence of cardiac strain due to an elevated troponin and worsening renal function. Patient with chronic anemia state treated by hematology suspect acute worsening of anemia secondary to worsening renal function versus postoperative bleed. Cardiology is on board and is ordered a heparin drip. I discussed case personally with nephrology and greatly appreciate their input and will look forward to their consult note.    Tienna Bienkowski, Grayling Congress, MD Family Medicine See Amion for pager # [facility]

## 2015-09-20 NOTE — ED Notes (Signed)
Admitting MD at bedside.

## 2015-09-20 NOTE — Progress Notes (Signed)
ANTIBIOTIC CONSULT NOTE - INITIAL  Pharmacy Consult for Vancomycin and Zosyn Indication: rule out pneumonia  Allergies  Allergen Reactions  . Statins Other (See Comments)    Pain/weakness in legs    Patient Measurements: Height: 5\' 8"  (172.7 cm) Weight: 158 lb 8.2 oz (71.9 kg) IBW/kg (Calculated) : 68.4 Adjusted Body Weight:   Vital Signs: Temp: 100.5 F (38.1 C) (10/10 2147) Temp Source: Oral (10/10 2147) BP: 127/45 mmHg (10/10 2147) Pulse Rate: 71 (10/10 1622) Intake/Output from previous day:   Intake/Output from this shift: Total I/O In: 324 [Blood:324] Out: -   Labs:  Recent Labs  09/20/15 0546 09/20/15 1550  WBC 8.6 10.5  HGB 7.4* 8.2*  PLT 180 155  CREATININE 2.67*  --    Estimated Creatinine Clearance: 18.9 mL/min (by C-G formula based on Cr of 2.67). No results for input(s): VANCOTROUGH, VANCOPEAK, VANCORANDOM, GENTTROUGH, GENTPEAK, GENTRANDOM, TOBRATROUGH, TOBRAPEAK, TOBRARND, AMIKACINPEAK, AMIKACINTROU, AMIKACIN in the last 72 hours.   Microbiology: No results found for this or any previous visit (from the past 720 hour(s)).  Medical History: Past Medical History  Diagnosis Date  . History of colon cancer     s/p colon resection  . Aortic stenosis     a. s/p AVR with 29mm Edwards pericardial valve 02/07/12 - post-op course complicated by pleural effusion requring thoracentesis/leg cellulitis 03/2012.  Marland Kitchen CAD (coronary artery disease)     a. s/p NSTEMI 12/2011:  LHC - Ostial left main 20%, ostial LAD 50%, mid 60-70%, ostial D1 40% and mid 40%, D2 70%, ostial circumflex occluded, ostial RCA 80-90%, LVEDP was 42. b.  s/p CABG x 3 at time of AVR (LiMA-LAD, SVG-2nd daigonal, SVG-PDA) 02/07/12 (post-op course noted above).  . Ischemic cardiomyopathy   . Chronic systolic heart failure ([place])     a. TEE 01/2012: EF 25-30%, diffuse hypokinesis. b. Not on ACEI due to renal insufficiency.;  c. follow up  echo 08/06/12: EF 25%, mod diast dysfxn, AVR ok, mild MR, mod  LAE, mild RAE, mild to mod RV systolic dysfunction  . HLD (hyperlipidemia)   . GERD (gastroesophageal reflux disease)   . Chronic kidney disease   . Pleural effusion     a. post-operatively after AVR/CABG s/p thoracentesis 03/2012 yielding 1L serosanguinous fluid.  . Diabetes mellitus     borderline  . Blood transfusion     NO REACTION TO TRANSFUSION  . Cellulitis     a. RLE cellulitis 2 months post-operatively after AVR/CABG - serratia marcessans, tx with I&D/antibiotics  . Mitral regurgitation     Moderate by TEE 01/2012  . Flash pulmonary edema ([place])     Post-cath 12/2011, went into acute pulm edema requiring IV lasix and intubation  . Baker's cyst 07/23/12    Incidental finding of LE venous dopplers  . Cataract     right eye, hx of  . HTN (hypertension)     primary, Dr. Hollace Kinnier  . Myocardial infarction ([place]) 12/2011  . CHF (congestive heart failure) ([place])   . Stroke ([place]) 10/01    RIght Leg weakness  . Cancer [facility]) '90's    Colon  . Anemia     Medications:  Prescriptions prior to admission  Medication Sig Dispense Refill Last Dose  . aspirin EC 325 MG tablet Take 325 mg by mouth daily.    09/19/2015 at Unknown time  . carvedilol (COREG) 25 MG tablet TAKE 1 TABLET BY MOUTH TWICE A DAY WITH A MEAL 180 tablet 0 09/19/2015 at 1830  .  cholecalciferol (VITAMIN D) 1000 UNITS tablet Take 1,000 Units by mouth daily with supper.    09/19/2015 at Unknown time  . furosemide (LASIX) 40 MG tablet Take 2 tablets (80 mg total) by mouth daily. 180 tablet 1 09/19/2015 at Unknown time  . hydrALAZINE (APRESOLINE) 25 MG tablet TAKE 1 TABLET BY MOUTH 3 TIMES A DAY 270 tablet 3 09/19/2015 at Unknown time  . isosorbide mononitrate (IMDUR) 60 MG 24 hr tablet TAKE 1 TABLET BY MOUTH EVERY DAY 30 tablet 6 09/19/2015 at Unknown time  . Multiple Vitamin (MULTIVITAMIN WITH MINERALS) TABS tablet Take 1 tablet by mouth daily.   09/19/2015 at Unknown time  . omeprazole (PRILOSEC) 20 MG capsule Take 20 mg by  mouth daily.    09/19/2015 at Unknown time  . oxyCODONE (OXY IR/ROXICODONE) 5 MG immediate release tablet Take 1-2 tablets (5-10 mg total) by mouth every 4 (four) hours as needed for moderate pain. 30 tablet 0 Past Week at Unknown time  . Prenatal Vit-Fe Fumarate-FA (MULTIVITAMIN-PRENATAL) 27-0.8 MG TABS Take 1 tablet by mouth daily with supper.    09/19/2015 at Unknown time  . tamsulosin (FLOMAX) 0.4 MG CAPS capsule TAKE 1 CAPSULE BY MOUTH ONCE DAILY AFTER SUPPER 30 capsule 5 09/19/2015 at Unknown time  . Travoprost, BAK Free, (TRAVATAN) 0.004 % SOLN ophthalmic solution Place 1 drop into both eyes daily.    09/19/2015 at Unknown time  . AMBULATORY NON FORMULARY MEDICATION Potassium 40 meq Take one tablet by mouth once daily for 3 days (Patient not taking: Reported on 09/20/2015) 3 tablet 0 Not Taking at Unknown time  . amoxicillin (AMOXIL) 500 MG capsule Take 4 capsules (2,000 mg total) by mouth See admin instructions. Take 4 capsules (2000 mg) prior to surgery or dental appointments 4 capsule 1 unknown  . nitroGLYCERIN (NITROSTAT) 0.4 MG SL tablet Place 0.4 mg under the tongue every 5 (five) minutes as needed for chest pain.   unknown  . Tdap (BOOSTRIX) 5-2.5-18.5 LF-MCG/0.5 injection Inject 0.5 mLs into the muscle once. (Patient not taking: Reported on 09/20/2015) 0.5 mL 0 Not Taking at Unknown time   Scheduled:  . aspirin  324 mg Oral Once  . carvedilol  25 mg Oral BID WC  . [START ON 09/21/2015] furosemide  80 mg Oral Daily  . heparin subcutaneous  5,000 Units Subcutaneous 3 times per day  . hydrALAZINE  25 mg Oral TID  . insulin aspart  0-9 Units Subcutaneous 6 times per day  . latanoprost  1 drop Both Eyes QHS  . magic mouthwash  10 mL Oral QID  . pantoprazole  40 mg Oral Q0600  . sodium chloride  3 mL Intravenous Q12H  . tamsulosin  0.4 mg Oral Daily   Infusions:  . sodium chloride    . nitroGLYCERIN 10 mcg/min (09/20/15 5621)   Assessment: 81yo male with history of CAD, ICM, CKD and  colon cancer presents with CP and SOB. Pharmacy is consulted to dose vancomycin and zosyn for suspected aspiration pneumonia vs HCAP. Pt is febrile to 100.5, WBC 10.5, sCr 2.67.  Goal of Therapy:  Vancomycin trough level 15-20 mcg/ml  Plan:  Vancomycin 1g IV q48h Zosyn 2.25g IV q6h Expected duration 7 days with resolution of temperature and/or normalization of WBC Measure antibiotic drug levels at steady state Follow up culture results, renal function and clinical course  Andrey Cota. Diona Foley, PharmD Clinical Pharmacist Pager [phone] 09/20/2015,10:19 PM

## 2015-09-20 NOTE — ED Provider Notes (Signed)
HEMOGLOBIN  Date Value Ref Range Status  09/20/2015 7.4* 13.0 - 17.0 g/dL Final  09/14/2015 8.2* 13.0 - 17.0 g/dL Final    Comment:    REPEATED TO VERIFY  09/13/2015 9.7* 13.0 - 17.0 g/dL Final  09/10/2015 12.5* 13.0 - 17.0 g/dL Final   HGB  Date Value Ref Range Status  08/11/2015 12.2* 13.0 - 17.1 g/dL Final  05/12/2015 11.9* 13.0 - 17.1 g/dL Final  02/17/2015 11.7* 13.0 - 17.1 g/dL Final  01/20/2015 11.8* 13.0 - 17.1 g/dL Final    8:27 AM Patient seen in consultation by Dr. Ellyn Hack of cardiology who recommends hospitalist admission given the patient's advanced age and, get a medical history including his dropping hemoglobin which is now 7.4.  He thinks that the anemia could be leading to some supply and demand troponin leak.  Chest pain is right-sided and worse with movement.  Dr. Ellyn Hack and his team will continue to follow with the patient.  No melena or hematochezia.  Hemoccult testing is negative.  Anemia panel added.  One year ago the patient did receive a Procrit injection for anemia.  He has never had a blood transfusion before.   EKG Interpretation  Date/Time:  Monday September 20 2015 05:36:17 EDT Ventricular Rate:  60 PR Interval:  203 QRS Duration: 120 QT Interval:  462 QTC Calculation: 462 R Axis:   -48 Text Interpretation:  Sinus rhythm Probable left atrial enlargement LVH with IVCD, LAD and secondary repol abnrm When compared with ECG of 04/08/2013, No significant change was found Confirmed by [facility]  MD, DAVID ([id number]) on 09/20/2015 5:45:10 AM        Jola Schmidt, MD 09/20/15 [phone]

## 2015-09-20 NOTE — Progress Notes (Signed)
Family stating pt is choking on food as well now. Pt made NPO until speech eval. Family made aware of changes. Carroll Kinds RN

## 2015-09-21 ENCOUNTER — Observation Stay (HOSPITAL_BASED_OUTPATIENT_CLINIC_OR_DEPARTMENT_OTHER): Payer: Medicare Other

## 2015-09-21 ENCOUNTER — Observation Stay (HOSPITAL_COMMUNITY): Payer: Medicare Other

## 2015-09-21 DIAGNOSIS — E1169 Type 2 diabetes mellitus with other specified complication: Secondary | ICD-10-CM | POA: Diagnosis not present

## 2015-09-21 DIAGNOSIS — D631 Anemia in chronic kidney disease: Secondary | ICD-10-CM | POA: Diagnosis present

## 2015-09-21 DIAGNOSIS — I5041 Acute combined systolic (congestive) and diastolic (congestive) heart failure: Secondary | ICD-10-CM | POA: Diagnosis not present

## 2015-09-21 DIAGNOSIS — I340 Nonrheumatic mitral (valve) insufficiency: Secondary | ICD-10-CM | POA: Diagnosis present

## 2015-09-21 DIAGNOSIS — Z7982 Long term (current) use of aspirin: Secondary | ICD-10-CM | POA: Diagnosis not present

## 2015-09-21 DIAGNOSIS — E1122 Type 2 diabetes mellitus with diabetic chronic kidney disease: Secondary | ICD-10-CM | POA: Diagnosis present

## 2015-09-21 DIAGNOSIS — Z79891 Long term (current) use of opiate analgesic: Secondary | ICD-10-CM | POA: Diagnosis not present

## 2015-09-21 DIAGNOSIS — N189 Chronic kidney disease, unspecified: Secondary | ICD-10-CM | POA: Diagnosis not present

## 2015-09-21 DIAGNOSIS — Z951 Presence of aortocoronary bypass graft: Secondary | ICD-10-CM | POA: Diagnosis not present

## 2015-09-21 DIAGNOSIS — Z794 Long term (current) use of insulin: Secondary | ICD-10-CM | POA: Diagnosis not present

## 2015-09-21 DIAGNOSIS — R072 Precordial pain: Secondary | ICD-10-CM

## 2015-09-21 DIAGNOSIS — H44513 Absolute glaucoma, bilateral: Secondary | ICD-10-CM | POA: Diagnosis not present

## 2015-09-21 DIAGNOSIS — R0782 Intercostal pain: Secondary | ICD-10-CM | POA: Diagnosis not present

## 2015-09-21 DIAGNOSIS — N289 Disorder of kidney and ureter, unspecified: Secondary | ICD-10-CM | POA: Diagnosis not present

## 2015-09-21 DIAGNOSIS — I10 Essential (primary) hypertension: Secondary | ICD-10-CM | POA: Diagnosis not present

## 2015-09-21 DIAGNOSIS — I2510 Atherosclerotic heart disease of native coronary artery without angina pectoris: Secondary | ICD-10-CM | POA: Diagnosis present

## 2015-09-21 DIAGNOSIS — E1129 Type 2 diabetes mellitus with other diabetic kidney complication: Secondary | ICD-10-CM | POA: Diagnosis not present

## 2015-09-21 DIAGNOSIS — I252 Old myocardial infarction: Secondary | ICD-10-CM | POA: Diagnosis not present

## 2015-09-21 DIAGNOSIS — Z888 Allergy status to other drugs, medicaments and biological substances status: Secondary | ICD-10-CM | POA: Diagnosis not present

## 2015-09-21 DIAGNOSIS — R1319 Other dysphagia: Secondary | ICD-10-CM | POA: Diagnosis present

## 2015-09-21 DIAGNOSIS — Z66 Do not resuscitate: Secondary | ICD-10-CM | POA: Diagnosis present

## 2015-09-21 DIAGNOSIS — I5042 Chronic combined systolic (congestive) and diastolic (congestive) heart failure: Secondary | ICD-10-CM | POA: Diagnosis not present

## 2015-09-21 DIAGNOSIS — R0789 Other chest pain: Secondary | ICD-10-CM | POA: Diagnosis not present

## 2015-09-21 DIAGNOSIS — Z79899 Other long term (current) drug therapy: Secondary | ICD-10-CM | POA: Diagnosis not present

## 2015-09-21 DIAGNOSIS — Z8249 Family history of ischemic heart disease and other diseases of the circulatory system: Secondary | ICD-10-CM | POA: Diagnosis not present

## 2015-09-21 DIAGNOSIS — Z85038 Personal history of other malignant neoplasm of large intestine: Secondary | ICD-10-CM | POA: Diagnosis not present

## 2015-09-21 DIAGNOSIS — J189 Pneumonia, unspecified organism: Secondary | ICD-10-CM | POA: Diagnosis not present

## 2015-09-21 DIAGNOSIS — Y95 Nosocomial condition: Secondary | ICD-10-CM | POA: Diagnosis not present

## 2015-09-21 DIAGNOSIS — K219 Gastro-esophageal reflux disease without esophagitis: Secondary | ICD-10-CM | POA: Diagnosis present

## 2015-09-21 DIAGNOSIS — I25110 Atherosclerotic heart disease of native coronary artery with unstable angina pectoris: Secondary | ICD-10-CM | POA: Diagnosis not present

## 2015-09-21 DIAGNOSIS — I255 Ischemic cardiomyopathy: Secondary | ICD-10-CM | POA: Diagnosis present

## 2015-09-21 DIAGNOSIS — D62 Acute posthemorrhagic anemia: Secondary | ICD-10-CM | POA: Diagnosis present

## 2015-09-21 DIAGNOSIS — E785 Hyperlipidemia, unspecified: Secondary | ICD-10-CM | POA: Diagnosis present

## 2015-09-21 DIAGNOSIS — Z952 Presence of prosthetic heart valve: Secondary | ICD-10-CM | POA: Diagnosis not present

## 2015-09-21 DIAGNOSIS — I70229 Atherosclerosis of native arteries of extremities with rest pain, unspecified extremity: Secondary | ICD-10-CM | POA: Diagnosis not present

## 2015-09-21 DIAGNOSIS — R066 Hiccough: Secondary | ICD-10-CM | POA: Diagnosis present

## 2015-09-21 DIAGNOSIS — N179 Acute kidney failure, unspecified: Secondary | ICD-10-CM | POA: Diagnosis not present

## 2015-09-21 DIAGNOSIS — N184 Chronic kidney disease, stage 4 (severe): Secondary | ICD-10-CM | POA: Diagnosis present

## 2015-09-21 DIAGNOSIS — R079 Chest pain, unspecified: Secondary | ICD-10-CM | POA: Diagnosis not present

## 2015-09-21 DIAGNOSIS — N2581 Secondary hyperparathyroidism of renal origin: Secondary | ICD-10-CM | POA: Diagnosis present

## 2015-09-21 DIAGNOSIS — I209 Angina pectoris, unspecified: Secondary | ICD-10-CM | POA: Diagnosis not present

## 2015-09-21 DIAGNOSIS — B370 Candidal stomatitis: Secondary | ICD-10-CM | POA: Diagnosis not present

## 2015-09-21 DIAGNOSIS — J188 Other pneumonia, unspecified organism: Secondary | ICD-10-CM | POA: Diagnosis not present

## 2015-09-21 LAB — COMPREHENSIVE METABOLIC PANEL
ALT: 19 U/L (ref 17–63)
AST: 26 U/L (ref 15–41)
Albumin: 2.4 g/dL — ABNORMAL LOW (ref 3.5–5.0)
Alkaline Phosphatase: 65 U/L (ref 38–126)
Anion gap: 10 (ref 5–15)
BUN: 55 mg/dL — ABNORMAL HIGH (ref 6–20)
CHLORIDE: 96 mmol/L — AB (ref 101–111)
CO2: 29 mmol/L (ref 22–32)
CREATININE: 2.69 mg/dL — AB (ref 0.61–1.24)
Calcium: 8.5 mg/dL — ABNORMAL LOW (ref 8.9–10.3)
GFR calc non Af Amer: 20 mL/min — ABNORMAL LOW (ref >60)
GFR, EST AFRICAN AMERICAN: 23 mL/min — AB (ref >60)
Glucose, Bld: 190 mg/dL — ABNORMAL HIGH (ref 65–99)
Potassium: 3.9 mmol/L (ref 3.5–5.1)
SODIUM: 135 mmol/L (ref 135–145)
Total Bilirubin: 1.6 mg/dL — ABNORMAL HIGH (ref 0.3–1.2)
Total Protein: 6.1 g/dL — ABNORMAL LOW (ref 6.5–8.1)

## 2015-09-21 LAB — PROTEIN ELECTROPHORESIS, SERUM
A/G Ratio: 0.7 (ref 0.7–1.7)
ALBUMIN ELP: 2.5 g/dL — AB (ref 2.9–4.4)
ALPHA-1-GLOBULIN: 0.5 g/dL — AB (ref 0.0–0.4)
ALPHA-2-GLOBULIN: 0.9 g/dL (ref 0.4–1.0)
Beta Globulin: 1 g/dL (ref 0.7–1.3)
GAMMA GLOBULIN: 1 g/dL (ref 0.4–1.8)
GLOBULIN, TOTAL: 3.4 g/dL (ref 2.2–3.9)
TOTAL PROTEIN ELP: 5.9 g/dL — AB (ref 6.0–8.5)

## 2015-09-21 LAB — TYPE AND SCREEN
ABO/RH(D): O POS
ANTIBODY SCREEN: NEGATIVE
UNIT DIVISION: 0
UNIT DIVISION: 0

## 2015-09-21 LAB — GLUCOSE, CAPILLARY
GLUCOSE-CAPILLARY: 178 mg/dL — AB (ref 65–99)
GLUCOSE-CAPILLARY: 158 mg/dL — AB (ref 65–99)
GLUCOSE-CAPILLARY: 196 mg/dL — AB (ref 65–99)
Glucose-Capillary: 149 mg/dL — ABNORMAL HIGH (ref 65–99)
Glucose-Capillary: 145 mg/dL — ABNORMAL HIGH (ref 65–99)

## 2015-09-21 LAB — URINALYSIS, ROUTINE W REFLEX MICROSCOPIC
Bilirubin Urine: NEGATIVE
Glucose, UA: NEGATIVE mg/dL
Hgb urine dipstick: NEGATIVE
Ketones, ur: NEGATIVE mg/dL
LEUKOCYTES UA: NEGATIVE
Nitrite: NEGATIVE
PROTEIN: 100 mg/dL — AB
SPECIFIC GRAVITY, URINE: 1.019 (ref 1.005–1.030)
UROBILINOGEN UA: 0.2 mg/dL (ref 0.0–1.0)
pH: 5 (ref 5.0–8.0)

## 2015-09-21 LAB — CBC
HCT: 29.5 % — ABNORMAL LOW (ref 39.0–52.0)
HCT: 29.7 % — ABNORMAL LOW (ref 39.0–52.0)
HEMOGLOBIN: 9.8 g/dL — AB (ref 13.0–17.0)
Hemoglobin: 9.8 g/dL — ABNORMAL LOW (ref 13.0–17.0)
MCH: 33.1 pg (ref 26.0–34.0)
MCH: 32.9 pg (ref 26.0–34.0)
MCHC: 33.2 g/dL (ref 30.0–36.0)
MCHC: 33 g/dL (ref 30.0–36.0)
MCV: 99.7 fL (ref 78.0–100.0)
MCV: 99.7 fL (ref 78.0–100.0)
PLATELETS: 164 10*3/uL (ref 150–400)
PLATELETS: 168 10*3/uL (ref 150–400)
RBC: 2.96 MIL/uL — AB (ref 4.22–5.81)
RBC: 2.98 MIL/uL — AB (ref 4.22–5.81)
RDW: 14.6 % (ref 11.5–15.5)
RDW: 14.7 % (ref 11.5–15.5)
WBC: 8.8 10*3/uL (ref 4.0–10.5)
WBC: 10 10*3/uL (ref 4.0–10.5)

## 2015-09-21 LAB — STREP PNEUMONIAE URINARY ANTIGEN: STREP PNEUMO URINARY ANTIGEN: NEGATIVE

## 2015-09-21 LAB — HEMOGLOBIN A1C
Hgb A1c MFr Bld: 7.7 % — ABNORMAL HIGH (ref 4.8–5.6)
Mean Plasma Glucose: 174 mg/dL

## 2015-09-21 LAB — URINE MICROSCOPIC-ADD ON

## 2015-09-21 LAB — SODIUM, URINE, RANDOM

## 2015-09-21 LAB — OCCULT BLOOD X 1 CARD TO LAB, STOOL: FECAL OCCULT BLD: NEGATIVE

## 2015-09-21 LAB — PARATHYROID HORMONE, INTACT (NO CA): PTH: 48 pg/mL (ref 15–65)

## 2015-09-21 MED ORDER — CETYLPYRIDINIUM CHLORIDE 0.05 % MT LIQD
7.0000 mL | Freq: Two times a day (BID) | OROMUCOSAL | Status: DC
  Administered 2015-09-21 – 2015-09-24 (×7): 7 mL via OROMUCOSAL

## 2015-09-21 MED ORDER — LIVING WELL WITH DIABETES BOOK
Freq: Once | Status: DC
  Filled 2015-09-21: qty 1

## 2015-09-21 MED ORDER — NITROGLYCERIN 0.4 MG SL SUBL: 0.4000 mg | SUBLINGUAL_TABLET | SUBLINGUAL | Status: DC | PRN

## 2015-09-21 NOTE — Progress Notes (Signed)
Shift event note:  Notified by RN regarding results of repeat CXR that was requested by attending this evening d/t increased tachypnea and decreased sats. There was concern earlier today that pt may be aspirating so he was made NPO pending clinical swallow eval in am. CXR reveals opacity behind (L) heart and PNA can not be excluded. Pt currently stable w/ sats in the high 90's on 2L [place]. T-100.5, remaining VSS. Tachypnea has resolved. Assessment/Paln: 1. Tachypnea: CXR can not r/o PNA. Will add IV Vanc/Zosyn to cover aspiration pneumonitis vs HCAP. Clinical swallow evaluation in am. Discussed pt w/ Dr Hal Hope who is in agreement w/ plan. Continue to monitor closely on telemetry.  Jeryl Columbia, NP-C [facility] Pager [phone]

## 2015-09-21 NOTE — Progress Notes (Signed)
Patient ID: [name] [name], male   DOB: 29-Nov-1926, 81 y.o.   MRN: [id number] S:No new complaints O:BP 135/61 mmHg  Pulse 55  Temp(Src) 99.1 F (37.3 C) (Oral)  Resp 17  Ht 5\' 8"  (1.727 m)  Wt 71.6 kg (157 lb 13.6 oz)  BMI 24.01 kg/m2  SpO2 100%  Intake/Output Summary (Last 24 hours) at 09/21/15 0856 Last data filed at 09/21/15 0700  Gross per 24 hour  Intake 1353.17 ml  Output    450 ml  Net 903.17 ml   Intake/Output: I/O last 3 completed shifts: In: 1353.2 [P.O.:200; I.V.:251.2; Blood:602; IV Piggyback:300] Out: 450 [Urine:450]  Intake/Output this shift:    Weight change: -0.3 kg (-10.6 oz) WKG:SUPJS elderly WM in NAD CVS:no rub Resp:scattered rhonchi RPR:XYVOPF YTW:KMQKM edema   Recent Labs Lab 09/20/15 0546 09/20/15 1550 09/21/15 0401  NA 137  --  135  K 4.4  --  3.9  CL 97*  --  96*  CO2 29  --  29  GLUCOSE 296*  --  190*  BUN 55*  --  55*  CREATININE 2.67*  --  2.69*  ALBUMIN  --   --  2.4*  CALCIUM 8.8*  --  8.5*  PHOS  --  2.7  --   AST  --   --  26  ALT  --   --  19   Liver Function Tests:  Recent Labs Lab 09/21/15 0401  AST 26  ALT 19  ALKPHOS 65  BILITOT 1.6*  PROT 6.1*  ALBUMIN 2.4*   No results for input(s): LIPASE, AMYLASE in the last 168 hours. No results for input(s): AMMONIA in the last 168 hours. CBC:  Recent Labs Lab 09/20/15 0546 09/20/15 1550 09/21/15 0401  WBC 8.6 10.5 8.8  NEUTROABS 6.8  --   --   HGB 7.4* 8.2* 9.8*  HCT 23.3* 24.7* 29.5*  MCV 101.3* 99.6 99.7  PLT 180 155 164   Cardiac Enzymes:  Recent Labs Lab 09/20/15 0546 09/20/15 1055 09/20/15 1318  TROPONINI 0.28* 0.14* 0.11*   CBG:  Recent Labs Lab 09/20/15 1610 09/20/15 2336 09/21/15 0316 09/21/15 0743  GLUCAP 251* 195* 178* 149*    Iron Studies:  Recent Labs  09/20/15 0849  IRON 19*  TIBC 185*  FERRITIN 250   Studies/Results: Dg Chest 2 View  09/20/2015   CLINICAL DATA:  Right-sided chest pain since 10/30 last night. Shortness  of breath. History of hypertension.  EXAM: CHEST  2 VIEW  COMPARISON:  06/28/2014  FINDINGS: Postoperative changes in the mediastinum. Cardiac enlargement. No pulmonary vascular congestion. There is infiltration or atelectasis in the left lung base behind the heart. No blunting of costophrenic angles. No pneumothorax. Mediastinal contours appear intact. Calcification of the aorta. Degenerative changes in the spine.  IMPRESSION: Infiltration or atelectasis in the left lung base behind the heart.   Electronically Signed   By: Lucienne Capers M.D.   On: 09/20/2015 06:30   Dg Chest Port 1 View  09/20/2015   CLINICAL DATA:  Tachypnea.  EXAM: PORTABLE CHEST 1 VIEW  COMPARISON:  09/20/2015 at 6:04 a.m.  FINDINGS: There changes from cardiac surgery, stable. Cardiac silhouette is mildly enlarged. Left lung base streaky opacity noted on the prior study is stable. Mild prominence of the bronchovascular markings. Lungs otherwise clear. No convincing pleural effusion and no pneumothorax. No mediastinal or hilar masses or convincing adenopathy.  IMPRESSION: 1. No convincing change from the earlier study. 2. Opacity noted behind the left heart  on the prior study is unchanged. This is most likely atelectasis. Pneumonia not excluded. No evidence of pulmonary edema.   Electronically Signed   By: Lajean Manes M.D.   On: 09/20/2015 19:00   . antiseptic oral rinse  7 mL Mouth Rinse BID  . aspirin  324 mg Oral Once  . carvedilol  25 mg Oral BID WC  . furosemide  80 mg Oral Daily  . heparin subcutaneous  5,000 Units Subcutaneous 3 times per day  . hydrALAZINE  25 mg Oral TID  . insulin aspart  0-9 Units Subcutaneous 6 times per day  . latanoprost  1 drop Both Eyes QHS  . living well with diabetes book   Does not apply Once  . magic mouthwash  10 mL Oral QID  . pantoprazole  40 mg Oral Q0600  . piperacillin-tazobactam (ZOSYN)  IV  2.25 g Intravenous Q6H  . sodium chloride  3 mL Intravenous Q12H  . tamsulosin  0.4 mg  Oral Daily  . vancomycin  1,000 mg Intravenous Q48H    BMET    Component Value Date/Time   NA 135 09/21/2015 0401   NA 140 02/15/2015 1539   NA 140 09/04/2012 1309   K 3.9 09/21/2015 0401   K 4.7 09/04/2012 1309   CL 96* 09/21/2015 0401   CL 101 09/04/2012 1309   CO2 29 09/21/2015 0401   CO2 31* 09/04/2012 1309   GLUCOSE 190* 09/21/2015 0401   GLUCOSE 150* 02/15/2015 1539   GLUCOSE 148* 09/04/2012 1309   BUN 55* 09/21/2015 0401   BUN 39* 02/15/2015 1539   BUN 41.0* 09/04/2012 1309   CREATININE 2.69* 09/21/2015 0401   CREATININE 2.21* 01/08/2013 1152   CREATININE 2.2* 09/04/2012 1309   CALCIUM 8.5* 09/21/2015 0401   CALCIUM 9.3 09/04/2012 1309   GFRNONAA 20* 09/21/2015 0401   GFRAA 23* 09/21/2015 0401   CBC    Component Value Date/Time   WBC 8.8 09/21/2015 0401   WBC 6.1 08/11/2015 1326   WBC 4.8 07/07/2013 1626   RBC 2.96* 09/21/2015 0401   RBC 2.27* 09/20/2015 0849   RBC 3.74* 08/11/2015 1326   RBC 3.64* 07/07/2013 1626   HGB 9.8* 09/21/2015 0401   HGB 12.2* 08/11/2015 1326   HCT 29.5* 09/21/2015 0401   HCT 36.8* 08/11/2015 1326   PLT 164 09/21/2015 0401   PLT 135* 08/11/2015 1326   MCV 99.7 09/21/2015 0401   MCV 98.4* 08/11/2015 1326   MCH 33.1 09/21/2015 0401   MCH 32.7 08/11/2015 1326   MCH 29.1 07/07/2013 1626   MCHC 33.2 09/21/2015 0401   MCHC 33.2 08/11/2015 1326   MCHC 30.4* 07/07/2013 1626   RDW 14.6 09/21/2015 0401   RDW 13.6 08/11/2015 1326   RDW 15.1 07/07/2013 1626   LYMPHSABS 1.1 09/20/2015 0546   LYMPHSABS 2.2 08/11/2015 1326   LYMPHSABS 2.1 07/07/2013 1626   MONOABS 0.7 09/20/2015 0546   MONOABS 0.4 08/11/2015 1326   EOSABS 0.0 09/20/2015 0546   EOSABS 0.1 08/11/2015 1326   BASOSABS 0.0 09/20/2015 0546   BASOSABS 0.0 08/11/2015 1326   BASOSABS 0.0 07/07/2013 1626     Assessment/Plan: 1. AKI/CKD stage 4- in setting of symptomatic anemia. Overall not a significant change in his overall kidney function and he has had longstanding  CKD stage 4 and has been appropriately managed by his PCP and Dr. Alen Blew. Family is in agreement that he is not a suitable candidate for renal replacement therapy, will continue to manage conservatively.  Continue to follow his UOP, daily Scr, and weight. 1. Scr stable  2. Patient and family do not want to pursue RRT which is very appropriate given his advanced age, debilitated state and multiple comorbidities.  His anemia is managed by Dr. Alen Blew and other issues by his PCP Dr. Mariea Clonts.  Nothing further to add and he will not need to follow up with our practice as he is well managed by Dr. Mariea Clonts. 3. Will sign off.  Please call with any questions or concerns. 2. Acute on chronic anemia- likely related to his recent surgery. Agree with blood transfusion given his symptoms and follow H/H. Cont to follow up with Dr. Alen Blew as an outpt. 1. Hgb improved following blood transfusion. 3. Chronic systolic CHF due to ICMP- followed by Dr. Haroldine Laws. No changes for now. 4. CAD s/p CABG now with SSCP and + troponins in setting of acute anemia- cardiology following.   5. HTN- BP meds were on hold initially but now BP is starting to climb. Okay to resume outpt medications. 6. DM- per primary 7. Secondary HPTH- will check phos and iPTH  Kimari Coudriet A

## 2015-09-21 NOTE — Progress Notes (Signed)
PROGRESS NOTE  [name] [name] MGN:[id number] DOB: 08-02-1926 DOA: 09/20/2015 PCP: Hollace Kinnier, DO   HPI:  [name] [name] is a 81 y.o. male with PMHx including CAD, ischemic cardiomyopathy, CKD, anemia of chronic disease and colon cancer, s/p resection presenting to ED with non-radiating right sided chest pain. Pain woke him during the night and has been constant since onset. Wife states he was clammy overnight when the pain began. Pain is aggravated by coughing, hiccups (which he has had constantly since hernia repair 1 week ago), movement, and pressing on the area. No associated shortness of breath. His troponin is elevated but patient has acute on chronic kidney disease. Appetite has been poor since hernia repair a week ago.Endorses cough, yellow phlegm. No fevers.   In ED, VSS. Troponin elevated, no acute changes on EKG. Cardiology evaluated and started patient on IV heparin. Patient found to have acute on chronic anemia, hemoglobin 7.4. He had a significant drop in hgb (12 >>>9.4) following inguinal hernia repair on 10/4.  Subjective / 24 H Interval events - Still having constant hiccups and productive cough of thick white/yellow sputum - Breathing comfortably on 2L nasal cannula - CP unchanged at 5/10, unchanged from yesterday  Assessment/Plan: Principal Problem:   Chest pain Active Problems:   Essential hypertension   Acute on chronic renal failure (HCC)   Anemia due to chronic kidney disease   Atherosclerosis of native coronary artery-- s/p CABG   Chronic combined systolic and diastolic heart failure, NYHA class 2 (HCC)   Ischemic cardiomyopathy   S/P AVR (aortic valve replacement)   Chronic kidney disease, stage III (moderate): Cr ~2.2 baseline   Diabetes mellitus type 2, controlled, with complications (HCC)   S/P CABG x 3: LIMA-LAD, SVG-D2, SVG-rPDA   Candidiasis of mouth   Elevated troponin   Cough  Chest pain/Elevated troponin - Likely musculoskeletal etiology,  with history of recent fall with blunt trauma to chest and abdomen, no acute EKG changes, and downward trending troponins (now at 0.11 from 0.28) - Cardiology following  HCAP - With persistent productive cough  - Cannot rule out aspiration d/t severe dysphagia - Chest x-ray showing left sided opacity - Vancomycin and zosyn initiated - Febrile this morning at 100.6, now afebrile with no leukocytosis - Cultures pending - SLP evaluation pending  Essential hypertension - Stable, continue home regimen  Acute on chronic anemia - Followed by hematology (Dr. Alen Blew) for anemia of chronic disease. Baseline hemoglobin mid 11 to mid 12 - Hg improved from 7.4 to 9.8 after 2 units of blood - Continue to monitor H&H  AKI/Chronic kidney disease, stage IV  - Creatinine still above baseline. Patient's diurectics were temporarily increased last week after he had gained a few pounds postoperatively.  - Nephrology consulted and recommending no further changes, outpatient PCP f/u - d/c fluids  CAD, s/p CABG - Stable, continue current regimen  Chronic combined systolic and diastolic heart failure, NYHA class 2 ([place]) - Stable, no evidence for decompensation  - Last echo April 2014 revealed EF of 40%. Repeat echo pending - Cardiology following  - Continue home diuretics, beta blockers   Diabetes mellitus type 2, controlled - Hgb A1c 6.6 in March 2016, repeat A1c pending - CBGs stable on SSI, continue current regimen   Hiccups - PPI trial, no improvement thus far - If not improving then consider thorazine or baclofen.  - Consider CT as outpt if continues after discharge   Dysphagia - Likely inflammatory secondary to intubation last week  during hernia repair procedure, also hx of cervical esophageal web - Was given a trial of magic mouthwash with no improvement - Barium swallow recommended by SLP, pending  Diet: DIET DYS 3 Room service appropriate?: Yes; Fluid consistency:: Thin Fluids:  NS DVT Prophylaxis: Heparin  Code Status: DNR Family Communication: Wife and daughter at bedside  Disposition Plan: Continue as inpatient  Barriers to discharge: IV abx for HCAP, pending barium swallow for dysphagia  Consultants:  Nephrology  Cardiology  Procedures:  Echocardiogram  Barium Swallow pending  Antibiotics:  Vancomycin 09/20/2015 >>  Zosyn 09/20/2015 >>  Studies:  Dg Chest 2 View  09/20/2015   CLINICAL DATA:  Right-sided chest pain since 10/30 last night. Shortness of breath. History of hypertension.  EXAM: CHEST  2 VIEW  COMPARISON:  06/28/2014  FINDINGS: Postoperative changes in the mediastinum. Cardiac enlargement. No pulmonary vascular congestion. There is infiltration or atelectasis in the left lung base behind the heart. No blunting of costophrenic angles. No pneumothorax. Mediastinal contours appear intact. Calcification of the aorta. Degenerative changes in the spine.  IMPRESSION: Infiltration or atelectasis in the left lung base behind the heart.   Electronically Signed   By: Lucienne Capers M.D.   On: 09/20/2015 06:30   Dg Chest Port 1 View  09/20/2015   CLINICAL DATA:  Tachypnea.  EXAM: PORTABLE CHEST 1 VIEW  COMPARISON:  09/20/2015 at 6:04 a.m.  FINDINGS: There changes from cardiac surgery, stable. Cardiac silhouette is mildly enlarged. Left lung base streaky opacity noted on the prior study is stable. Mild prominence of the bronchovascular markings. Lungs otherwise clear. No convincing pleural effusion and no pneumothorax. No mediastinal or hilar masses or convincing adenopathy.  IMPRESSION: 1. No convincing change from the earlier study. 2. Opacity noted behind the left heart on the prior study is unchanged. This is most likely atelectasis. Pneumonia not excluded. No evidence of pulmonary edema.   Electronically Signed   By: Lajean Manes M.D.   On: 09/20/2015 19:00   Objective  Filed Vitals:   09/20/15 2147 09/20/15 2352 09/21/15 0333 09/21/15 0755    BP: 127/45 135/44 149/55 135/61  Pulse:    55  Temp: 100.5 F (38.1 C) 97.6 F (36.4 C) 98.2 F (36.8 C) 99.1 F (37.3 C)  TempSrc: Oral Oral Oral Oral  Resp:   20 17  Height:      Weight:   71.6 kg (157 lb 13.6 oz)   SpO2:   99% 100%    Intake/Output Summary (Last 24 hours) at 09/21/15 1129 Last data filed at 09/21/15 0700  Gross per 24 hour  Intake 1353.17 ml  Output    450 ml  Net 903.17 ml   Filed Weights   09/20/15 0600 09/21/15 0333  Weight: 71.9 kg (158 lb 8.2 oz) 71.6 kg (157 lb 13.6 oz)   Exam:  GENERAL: NAD  HEENT: head NCAT, no scleral icterus. Pupils round and reactive. Mucous membranes are moist.   NECK: Supple. No LAD  LUNGS: Clear to auscultation. No wheezing or crackles  HEART: Regular rate and rhythm. 2+ pulses, no JVD, no peripheral edema  ABDOMEN: Soft, non-distended, non-tender. Positive bowel sounds.  EXTREMITIES: Without any cyanosis or clubbing. Good muscle tone  NEUROLOGIC: Alert and oriented. Grossly non focal. Strength 5/5 in all 4.  PSYCHIATRIC: Normal mood and affect  SKIN: No ulceration, induration, rashes present. Mild TTP over right chest wall  Data Reviewed: Basic Metabolic Panel:  Recent Labs Lab 09/20/15 0546 09/20/15 1550 09/21/15 0401  NA 137  --  135  K 4.4  --  3.9  CL 97*  --  96*  CO2 29  --  29  GLUCOSE 296*  --  190*  BUN 55*  --  55*  CREATININE 2.67*  --  2.69*  CALCIUM 8.8*  --  8.5*  PHOS  --  2.7  --    Liver Function Tests:  Recent Labs Lab 09/21/15 0401  AST 26  ALT 19  ALKPHOS 65  BILITOT 1.6*  PROT 6.1*  ALBUMIN 2.4*   CBC:  Recent Labs Lab 09/20/15 0546 09/20/15 1550 09/21/15 0401 09/21/15 0843  WBC 8.6 10.5 8.8 10.0  NEUTROABS 6.8  --   --   --   HGB 7.4* 8.2* 9.8* 9.8*  HCT 23.3* 24.7* 29.5* 29.7*  MCV 101.3* 99.6 99.7 99.7  PLT 180 155 164 168   Cardiac Enzymes:  Recent Labs Lab 09/20/15 0546 09/20/15 1055 09/20/15 1318  TROPONINI 0.28* 0.14* 0.11*    CBG:  Recent Labs Lab 09/20/15 1610 09/20/15 2336 09/21/15 0316 09/21/15 0743  GLUCAP 251* 195* 178* 149*   Scheduled Meds: . antiseptic oral rinse  7 mL Mouth Rinse BID  . aspirin  324 mg Oral Once  . carvedilol  25 mg Oral BID WC  . furosemide  80 mg Oral Daily  . heparin subcutaneous  5,000 Units Subcutaneous 3 times per day  . hydrALAZINE  25 mg Oral TID  . insulin aspart  0-9 Units Subcutaneous 6 times per day  . latanoprost  1 drop Both Eyes QHS  . living well with diabetes book   Does not apply Once  . magic mouthwash  10 mL Oral QID  . pantoprazole  40 mg Oral Q0600  . piperacillin-tazobactam (ZOSYN)  IV  2.25 g Intravenous Q6H  . sodium chloride  3 mL Intravenous Q12H  . tamsulosin  0.4 mg Oral Daily  . vancomycin  1,000 mg Intravenous Q48H   Continuous Infusions: . nitroGLYCERIN 10 mcg/min (09/20/15 0656)   Merrie Roof, Student-PA Marzetta Board, MD [facility] Pager [phone]. If 7 PM - 7 AM, please contact night-coverage at [email or web address], password [facility] 09/21/2015, 11:29 AM  LOS: 1 day

## 2015-09-21 NOTE — Progress Notes (Addendum)
Inpatient Diabetes Program Recommendations  AACE/ADA: New Consensus Statement on Inpatient Glycemic Control (2015)  Target Ranges:  Prepandial:   less than 140 mg/dL      Peak postprandial:   less than 180 mg/dL (1-2 hours)      Critically ill patients:  140 - 180 mg/dL  Results for [name] [name], [name] [name] (MRN [id number]) as of 09/21/2015 08:25  Ref. Range 09/21/2015 04:01  Glucose Latest Ref Range: 65-99 mg/dL 190 (H)   Results for [name] [name], [name] [name] (MRN [id number]) as of 09/21/2015 08:25  Ref. Range 09/20/2015 10:53  Hemoglobin A1C Latest Ref Range: 4.8-5.6 % 7.7 (H)   Review of Glycemic Control  Diabetes history: DM2 Outpatient Diabetes medications: None Current orders for Inpatient glycemic control: Novolog 0-9 units Q4H  Inpatient Diabetes Program Recommendations: HgbA1C: A1C 7.7% on 09/20/15. Patient will need to be started on DM medication as an outpatient. Please have patient follow up with his PCP regarding glycemic control.  NOTE: Bedside RNs, please use each patient interaction to provide diabetes education. According to chart review, patient has a history of DM2. A1C is 7.7% on 09/20/15. Patient  will likely need to be started on DM medication as an outpatient. Please review Living Well with Diabetes booklet with the patient and have patient watch patient education videos on diabetes. Please allow patient to be actively engaged with diabetes management by allowing patient to check own glucose.  Thanks, Barnie Alderman, RN, MSN, CCRN, CDE Diabetes Coordinator Inpatient Diabetes Program [phone] (Team Pager from [place] to [place]) [phone] ([facility] office) [phone] [facility] office) [phone] [facility] office)

## 2015-09-21 NOTE — Evaluation (Signed)
Clinical/Bedside Swallow Evaluation Patient Details  Name: [name] [name] MRN: [id number] Date of Birth: [birth year]  Today's Date: 09/21/2015 Time: SLP Start Time (ACUTE ONLY): 0845 SLP Stop Time (ACUTE ONLY): 0900 SLP Time Calculation (min) (ACUTE ONLY): 15 min  Past Medical History:  Past Medical History  Diagnosis Date  . History of colon cancer     s/p colon resection  . Aortic stenosis     a. s/p AVR with 33mm Edwards pericardial valve 02/07/12 - post-op course complicated by pleural effusion requring thoracentesis/leg cellulitis 03/2012.  Marland Kitchen CAD (coronary artery disease)     a. s/p NSTEMI 12/2011:  LHC - Ostial left main 20%, ostial LAD 50%, mid 60-70%, ostial D1 40% and mid 40%, D2 70%, ostial circumflex occluded, ostial RCA 80-90%, LVEDP was 42. b.  s/p CABG x 3 at time of AVR (LiMA-LAD, SVG-2nd daigonal, SVG-PDA) 02/07/12 (post-op course noted above).  . Ischemic cardiomyopathy   . Chronic systolic heart failure ([place])     a. TEE 01/2012: EF 25-30%, diffuse hypokinesis. b. Not on ACEI due to renal insufficiency.;  c. follow up  echo 08/06/12: EF 25%, mod diast dysfxn, AVR ok, mild MR, mod LAE, mild RAE, mild to mod RV systolic dysfunction  . HLD (hyperlipidemia)   . GERD (gastroesophageal reflux disease)   . Chronic kidney disease   . Pleural effusion     a. post-operatively after AVR/CABG s/p thoracentesis 03/2012 yielding 1L serosanguinous fluid.  . Diabetes mellitus     borderline  . Blood transfusion     NO REACTION TO TRANSFUSION  . Cellulitis     a. RLE cellulitis 2 months post-operatively after AVR/CABG - serratia marcessans, tx with I&D/antibiotics  . Mitral regurgitation     Moderate by TEE 01/2012  . Flash pulmonary edema ([place])     Post-cath 12/2011, went into acute pulm edema requiring IV lasix and intubation  . Baker's cyst 07/23/12    Incidental finding of LE venous dopplers  . Cataract     right eye, hx of  . HTN (hypertension)     primary, Dr. Hollace Kinnier  .  Myocardial infarction ([place]) 12/2011  . CHF (congestive heart failure) ([place])   . Stroke ([place]) 10/01    RIght Leg weakness  . Cancer [facility]) '90's    Colon  . Anemia    Past Surgical History:  Past Surgical History  Procedure Laterality Date  . Colon resection  1996  . Esophageal dilation    . Colonoscopy    . Cataract extraction      rt  . Cardiac catheterization      1.3.13  stopped breathing, put on ventilator for 5-6 days  . Tonsillectomy    . Aortic valve replacement  02/07/2012    Procedure: AORTIC VALVE REPLACEMENT (AVR);  Surgeon: Gaye Pollack, MD;  Location: [place];  Service: Open Heart Surgery;  Laterality: N/A;  . Coronary artery bypass graft  02/07/2012    Procedure: CORONARY ARTERY BYPASS GRAFTING (CABG);  Surgeon: Gaye Pollack, MD;  [place] ;  Service: Open Heart Surgery;  Laterality: N/A;  CABG x three; using right leg greater saphenous vein harvested endoscopically  . Chest tube insertion  07/24/2012    Procedure: INSERTION PLEURAL DRAINAGE CATHETER;  Surgeon: Gaye Pollack, MD;  Location: [place];  Service: Thoracic;  Laterality: Left;  . Talc pleurodesis  08/30/2012    Procedure: TALC PLEURADESIS;  Surgeon: Gaye Pollack, MD;  Location: [place];  Service: Thoracic;  Laterality: Left;  INSERTION OF TALC VIA LEFT PLEURX  . Talc pleurodesis  09/12/2012    Procedure: TALC PLEURADESIS;  Surgeon: Gaye Pollack, MD;  Location: [place];  Service: Thoracic;  Laterality: Left;  . Removal of pleural drainage catheter  09/27/2012    Procedure: REMOVAL OF PLEURAL DRAINAGE CATHETER;  Surgeon: Gaye Pollack, MD;  Location: [place];  Service: Thoracic;  Laterality: Left;  Marland Kitchen Eye surgery  2009    cataract removed from right eye  . Esophagogastroduodenoscopy N/A 07/03/2014    Procedure: ESOPHAGOGASTRODUODENOSCOPY (EGD);  Surgeon: Arta Silence, MD;  Location: [facility] ENDOSCOPY;  Service: Endoscopy;  Laterality: N/A;  . Esophagoscopy with dilitation N/A 07/07/2014    Procedure: ESOPHAGOSCOPY  WITH DILITATION/SAVARY DILATOR;  Surgeon: Rozetta Nunnery, MD;  Location: [place];  Service: ENT;  Laterality: N/A;  . Ercp N/A 07/08/2014    Procedure: ENDOSCOPIC RETROGRADE CHOLANGIOPANCREATOGRAPHY (ERCP);  Surgeon: Missy Sabins, MD;  Location: [facility] ENDOSCOPY;  Service: Endoscopy;  Laterality: N/A;  . Left heart catheterization with coronary angiogram N/A 12/13/2011    Procedure: LEFT HEART CATHETERIZATION WITH CORONARY ANGIOGRAM;  Surgeon: Peter M Martinique, MD;  Location: [facility] CATH LAB;  Service: Cardiovascular;  Laterality: N/A;  . Inguinal hernia repair Right 09/13/2015    Procedure: OPEN RIGHT INGUINAL HERNIA;  Surgeon: Mickeal Skinner, MD;  Location: [place];  Service: General;  Laterality: Right;  . Insertion of mesh Right 09/13/2015    Procedure: INSERTION OF MESH;  Surgeon: Arta Bruce Kinsinger, MD;  Location: [place];  Service: General;  Laterality: Right;   HPI:  [name] [name] is a 81 y.o. male with multiple medical problems not limited to CAD, ischemic cardiomyopathy, CKD, anemia of chronic disease and colon cancer, s/p resection, and pneumonia. He presented to the ED on 09/20/15 with complaints of constant non-radiating right sided chest pain. Also had hiccups since inhuinal repair on 10/3, chest pain worse with hiccup. CXR showed infiltration or atelectasis in the left lung base behind the heart. Currently treated for suspected aspiration pneumonia v. HCAP. Pt has a history of cerical esophageal web and dilation, latest dilation July 2015.  Pt is NPO.   Assessment / Plan / Recommendation Clinical Impression  Pt tolerated all consistencies assessed with no overt s/s of aspiration. Pt exhibited multiple swallows of thin liquids and intermittent wet vocal quality (present at baseline), pt also has impaired mastication of solids, complained of dentures not fitting well. SLP provided verbal and tactile cues for small sips and bites and educated the pt and pt's daughter on aspiration precautions  and compensatory strategies. Recommend dysphagia 3 (mech soft) and thin liquids with small bites and sips, when sitting upright. Due to pt's history of cervical esophageal web, complaints of choking, and pneumonia, SLP will f/u with MBS today to objectively evaluate swallow function.     Aspiration Risk  Mild    Diet Recommendation Thin;Dysphagia 3 (Mech soft)   Medication Administration: Whole meds with liquid Compensations: Small sips/bites;Slow rate    Other  Recommendations Oral Care Recommendations: Oral care QID   Follow Up Recommendations       Frequency and Duration min 2x/week  2 weeks   Pertinent Vitals/Pain NA    SLP Swallow Goals     Swallow Study Prior Functional Status       General Other Pertinent Information: Dahlton Hinde is a 81 y.o. male with multiple medical problems not limited to CAD, ischemic cardiomyopathy, CKD, anemia of chronic disease and colon cancer,  s/p resection, and pneumonia. He presented to the ED on 09/20/15 with complaints of constant non-radiating right sided chest pain. Also had hiccups since inhuinal repair on 10/3, chest pain worse with hiccup. CXR showed infiltration or atelectasis in the left lung base behind the heart. Currently treated for suspected aspiration pneumonia v. HCAP. Pt has a history of cerical esophageal web and dilation, latest dilation July 2015.  Pt is NPO. Type of Study: Bedside swallow evaluation Diet Prior to this Study: NPO Temperature Spikes Noted: Yes Respiratory Status: Room air History of Recent Intubation: No Behavior/Cognition: Alert;Cooperative;Pleasant mood Oral Cavity - Dentition: Missing dentition;Poor condition;Other (Comment) (wears dentures on top, partial on bottom) Self-Feeding Abilities: Able to feed self;Needs set up Patient Positioning: Upright in bed Baseline Vocal Quality: Low vocal intensity Volitional Cough: Congested Volitional Swallow: Able to elicit    Oral/Motor/Sensory Function  Overall Oral Motor/Sensory Function: Impaired at baseline Labial Strength: Reduced Facial Strength: Reduced   Ice Chips Ice chips: Not tested   Thin Liquid Thin Liquid: Impaired Presentation: Straw;Cup Pharyngeal  Phase Impairments: Multiple swallows;Wet Vocal Quality    Nectar Thick Nectar Thick Liquid: Not tested   Honey Thick Honey Thick Liquid: Not tested   Puree Puree: Within functional limits Presentation: Self Fed   Solid   GO    Solid: Impaired Presentation: Self Fed Oral Phase Impairments: Impaired mastication       Lanier Ensign 09/21/2015,10:09 AM

## 2015-09-21 NOTE — Progress Notes (Signed)
*  PRELIMINARY RESULTS* Echocardiogram 2D Echocardiogram has been performed.  Leavy Cella 09/21/2015, 11:49 AM

## 2015-09-22 DIAGNOSIS — R05 Cough: Secondary | ICD-10-CM

## 2015-09-22 DIAGNOSIS — B370 Candidal stomatitis: Secondary | ICD-10-CM

## 2015-09-22 DIAGNOSIS — R0782 Intercostal pain: Secondary | ICD-10-CM

## 2015-09-22 LAB — UIFE/LIGHT CHAINS/TP QN, 24-HR UR
% BETA, URINE: 42.8 %
ALBUMIN, U: 8.2 %
ALPHA 1 URINE: 1.1 %
Alpha 2, Urine: 28.5 %
Free Kappa/Lambda Ratio: 6.5 (ref 2.04–10.37)
Free Lambda Lt Chains,Ur: 115 mg/L — ABNORMAL HIGH (ref 0.24–6.66)
Free Lt Chn Excr Rate: 748 mg/L — ABNORMAL HIGH (ref 1.35–24.19)
GAMMA GLOBULIN URINE: 19.4 %
Total Protein, Urine: 69.6 mg/dL

## 2015-09-22 LAB — GLUCOSE, CAPILLARY
GLUCOSE-CAPILLARY: 138 mg/dL — AB (ref 65–99)
GLUCOSE-CAPILLARY: 202 mg/dL — AB (ref 65–99)
Glucose-Capillary: 172 mg/dL — ABNORMAL HIGH (ref 65–99)
Glucose-Capillary: 129 mg/dL — ABNORMAL HIGH (ref 65–99)
Glucose-Capillary: 203 mg/dL — ABNORMAL HIGH (ref 65–99)
Glucose-Capillary: 280 mg/dL — ABNORMAL HIGH (ref 65–99)

## 2015-09-22 LAB — CBC
HCT: 28.6 % — ABNORMAL LOW (ref 39.0–52.0)
Hemoglobin: 9.4 g/dL — ABNORMAL LOW (ref 13.0–17.0)
MCH: 32.8 pg (ref 26.0–34.0)
MCHC: 32.9 g/dL (ref 30.0–36.0)
MCV: 99.7 fL (ref 78.0–100.0)
PLATELETS: ADEQUATE 10*3/uL (ref 150–400)
RBC: 2.87 MIL/uL — ABNORMAL LOW (ref 4.22–5.81)
RDW: 14.5 % (ref 11.5–15.5)
WBC: 8.3 10*3/uL (ref 4.0–10.5)

## 2015-09-22 LAB — BASIC METABOLIC PANEL
Anion gap: 13 (ref 5–15)
BUN: 51 mg/dL — AB (ref 6–20)
CO2: 25 mmol/L (ref 22–32)
Calcium: 8.2 mg/dL — ABNORMAL LOW (ref 8.9–10.3)
Chloride: 98 mmol/L — ABNORMAL LOW (ref 101–111)
Creatinine, Ser: 2.87 mg/dL — ABNORMAL HIGH (ref 0.61–1.24)
GFR calc Af Amer: 21 mL/min — ABNORMAL LOW (ref >60)
GFR, EST NON AFRICAN AMERICAN: 18 mL/min — AB (ref >60)
GLUCOSE: 127 mg/dL — AB (ref 65–99)
Potassium: 3.9 mmol/L (ref 3.5–5.1)
Sodium: 136 mmol/L (ref 135–145)

## 2015-09-22 LAB — LEGIONELLA PNEUMOPHILA SEROGP 1 UR AG: L. pneumophila Serogp 1 Ur Ag: NEGATIVE

## 2015-09-22 MED ORDER — CHLORPROMAZINE HCL 10 MG PO TABS
10.0000 mg | ORAL_TABLET | Freq: Three times a day (TID) | ORAL | Status: DC
  Administered 2015-09-22 – 2015-09-24 (×7): 10 mg via ORAL
  Filled 2015-09-22 (×9): qty 1

## 2015-09-22 MED ORDER — DM-GUAIFENESIN ER 30-600 MG PO TB12: 1.0000 | ORAL_TABLET | Freq: Two times a day (BID) | ORAL | Status: DC

## 2015-09-22 MED ORDER — GUAIFENESIN-DM 100-10 MG/5ML PO SYRP
10.0000 mL | ORAL_SOLUTION | Freq: Four times a day (QID) | ORAL | Status: DC
  Administered 2015-09-22 – 2015-09-24 (×7): 10 mL via ORAL
  Filled 2015-09-22 (×7): qty 10

## 2015-09-22 MED ORDER — AMOXICILLIN-POT CLAVULANATE 875-125 MG PO TABS: 1.0000 | ORAL_TABLET | Freq: Two times a day (BID) | ORAL | Status: DC

## 2015-09-22 MED ORDER — AMOXICILLIN-POT CLAVULANATE 500-125 MG PO TABS
1.0000 | ORAL_TABLET | Freq: Two times a day (BID) | ORAL | Status: DC
  Administered 2015-09-22 – 2015-09-24 (×5): 500 mg via ORAL
  Filled 2015-09-22 (×6): qty 1

## 2015-09-22 NOTE — Progress Notes (Signed)
Speech Language Pathology Treatment: Dysphagia  Patient Details Name: [name] [name] MRN: [id number] DOB: [birth year] Today's Date: 09/22/2015 Time: 5974-1638 SLP Time Calculation (min) (ACUTE ONLY): 17 min  Assessment / Plan / Recommendation Clinical Impression  Pt was seen for skilled ST targeting dysphagia treatment.  Upon arrival, pt was seated upright in bed, awake, alert, and agreeable to participate in [place].  SLP facilitated the session with skilled observations completed during presentations of pt's currently prescribed diet.  Pt consumed dys 2 textures and thin liquids without overt s/s of aspiration; however, pt required max faded to min assist verbal cues for recall and use of swallowing precautions.  Pt's wife and daughter were present throughout therapy session and were provided with brief education regarding rationale behind pt's currently prescribed diet and swallowing precautions.  All questions were answered to family's satisfaction at this time.     HPI Other Pertinent Information: [name] [name] is a 81 y.o. male with multiple medical problems not limited to CAD, ischemic cardiomyopathy, CKD, anemia of chronic disease and colon cancer, s/p resection, and pneumonia. He presented to the ED on 09/20/15 with complaints of constant non-radiating right sided chest pain. Also had hiccups since inhuinal repair on 10/3, chest pain worse with hiccup. CXR showed infiltration or atelectasis in the left lung base behind the heart. Currently treated for suspected aspiration pneumonia v. HCAP. Pt has a history of cerical esophageal web and dilation, latest dilation July 2015.  Pt is NPO.   Pertinent Vitals Pain Assessment: No/denies pain  SLP Plan  Continue with current plan of care    Recommendations Diet recommendations: Dysphagia 2 (fine chop);Thin liquid Medication Administration: Crushed with puree Supervision: Full supervision/cueing for compensatory strategies Compensations: Small  sips/bites;Slow rate;Multiple dry swallows after each bite/sip;Follow solids with liquid;Clear throat intermittently              Oral Care Recommendations: Oral care QID Plan: Continue with current plan of care    GO     PageSelinda [name] 09/22/2015, 2:55 PM

## 2015-09-22 NOTE — Progress Notes (Signed)
PROGRESS NOTE  [name] [name] YBW:[id number] DOB: 1926/01/16 DOA: 09/20/2015 PCP: Hollace Kinnier, DO   HPI:   81 y.o. ?  CAD +Nstemi + ischemic cardiomyopathy, S/P CABG with combined systolic/diastolic dysfunction and NYHA class II heart failure status post aortic valve repair 2013 CKD followed by oncology  esophagus stricture stricture status post dilatation 06/2012, CBD stones as well anemia of chronic disease and colon cancer, s/p resection   S/p RIH repair 09/13/15-09/14/15 Noted significant weight gain and called office as had gained about 7 pounds but did not take Lasix perioperatively and was instructed to take extra Lasix  Pain woke him during the night and has been constant since onset. Wife states he was clammy overnight when the pain began.  Pain is aggravated by coughing, hiccups (which he has had constantly since hernia repair 1 week ago), movement, and pressing on the area. No associated shortness of breath. His troponin is elevated but patient has acute on chronic kidney disease.  Appetite has been poor since hernia repair a week ago.Endorses cough, yellow phlegm. No fevers.   In ED, VSS. Troponin elevated, no acute changes on EKG. Cardiology evaluated and started patient on IV heparin. Patient found to have acute on chronic anemia, hemoglobin 7.4. He had a significant drop in hgb (12 >>>9.4) following inguinal hernia repair on 10/4.  Subjective     Assessment/Plan: Principal Problem:   Chest pain Active Problems:   Essential hypertension   Acute on chronic renal failure (HCC)   Anemia due to chronic kidney disease   Atherosclerosis of native coronary artery-- s/p CABG   Chronic combined systolic and diastolic heart failure, NYHA class 2 (HCC)   Ischemic cardiomyopathy   S/P AVR (aortic valve replacement)   Chronic kidney disease, stage III (moderate): Cr ~2.2 baseline   Diabetes mellitus type 2, controlled, with complications (HCC)   S/P CABG x 3: LIMA-LAD,  SVG-D2, SVG-rPDA   Candidiasis of mouth   Elevated troponin   Cough  Chest pain/Elevated troponin - Likely musculoskeletal etiology, with history of recent fall with blunt trauma to chest and abdomen, no acute EKG changes, and downward trending troponins (now at 0.11 from 0.28) - Cardiology following  HCAP - With persistent productive cough  - Chest x-ray showing left sided opacity - Vancomycin and zosyn initiated - Febrile 09/20/15 to as hgh as 100.6, now afebrile with no leukocytosis therefore antibiotics transitioned on 10/12 to by mouth Augmentin -CBC plus differential in a.m. - Cultures pending-strep pneumo was negative for Legionella is in process - SLP evaluation pending  Essential hypertension - Stable, continue Coreg 25 twice a day, Lasix 80 daily, hydralazine 25 3 times a day -His Imdur has been held  Acute on chronic anemia - Followed by hematology (Dr. Alen Blew) for anemia of chronic disease. Baseline hemoglobin mid 11 to mid 12 - Hg improved from 7.4 to 9.8-->9.4 -Received 2 units of blood this admission  AKI/Chronic kidney disease, stage IV  - Creatinine still above baseline. Patient's diurectics were temporarily increased last week after he had gained a few pounds postoperatively.  - Nephrology consulted and recommending no further changes, outpatient PCP f/u - d/c fluids  CAD, s/p CABG - Stable, continue current regimen  Chronic combined systolic and diastolic heart failure, NYHA class 2 ([place]) - Stable, no evidence for decompensation  - Last echo April 2014 revealed EF of 40%. Repeat echo pending - Cardiology following  - Continue Lasix 80 mg daily, beta blockers   Diabetes mellitus type 2,  controlled - Hgb A1c 6.6 in March 2016, repeat A1c pending - CBGs stable on SSI, continue current regimen  -CBGs are ranging between 120 and 170-  Hiccups - PPI trial, no improvement thus far - If not improving then consider thorazine or baclofen.  - Consider CT  as outpt if continues after discharge   Dysphagia - Likely inflammatory secondary to intubation last week during hernia repair procedure, also hx of cervical esophageal web status post dilatation - Was given a trial of magic mouthwash with no improvement - Barium swallow recommended 10/11 = moderate cervical dysphagia due to incomplete epiglottic closure - - Needs dysphagia 2 diet  Diet: DIET DYS 2 Room service appropriate?: Yes; Fluid consistency:: Thin Fluids: NS DVT Prophylaxis: Heparin  Code Status: DNR Family Communication: Long discussion with daughter at the bedside who understands Disposition Plan: Continue as inpatient  Barriers to discharge: Transitioned to by mouth antibiotics today 10/12 and reassess in a.m. for fever PT OT to see the patient as may require skilled level care  Consultants:  Nephrology  Cardiology  Procedures:  Echocardiogram  Barium Swallow   Antibiotics:  Vancomycin 09/20/2015 >> 09/22/15  Zosyn 09/20/2015 >> 09/22/15  Augmentin 09/22/15  Studies:  Dg Chest Port 1 View  09/20/2015  CLINICAL DATA:  Tachypnea. EXAM: PORTABLE CHEST 1 VIEW COMPARISON:  09/20/2015 at 6:04 a.m. FINDINGS: There changes from cardiac surgery, stable. Cardiac silhouette is mildly enlarged. Left lung base streaky opacity noted on the prior study is stable. Mild prominence of the bronchovascular markings. Lungs otherwise clear. No convincing pleural effusion and no pneumothorax. No mediastinal or hilar masses or convincing adenopathy. IMPRESSION: 1. No convincing change from the earlier study. 2. Opacity noted behind the left heart on the prior study is unchanged. This is most likely atelectasis. Pneumonia not excluded. No evidence of pulmonary edema. Electronically Signed   By: Lajean Manes M.D.   On: 09/20/2015 19:00   Dg Swallowing Func-speech Pathology  09/21/2015  Objective Swallowing Evaluation:   Patient Details Name: [name] [name] MRN: [id number] Date of  Birth: 1926-05-09 Today's Date: 09/21/2015 Time: SLP Start Time (ACUTE ONLY): 1330-SLP Stop Time (ACUTE ONLY): 1400 SLP Time Calculation (min) (ACUTE ONLY): 30 min Past Medical History: Past Medical History Diagnosis Date . History of colon cancer    s/p colon resection . Aortic stenosis    a. s/p AVR with 63mm Edwards pericardial valve 02/07/12 - post-op course complicated by pleural effusion requring thoracentesis/leg cellulitis 03/2012. Marland Kitchen CAD (coronary artery disease)    a. s/p NSTEMI 12/2011:  LHC - Ostial left main 20%, ostial LAD 50%, mid 60-70%, ostial D1 40% and mid 40%, D2 70%, ostial circumflex occluded, ostial RCA 80-90%, LVEDP was 42. b.  s/p CABG x 3 at time of AVR (LiMA-LAD, SVG-2nd daigonal, SVG-PDA) 02/07/12 (post-op course noted above). . Ischemic cardiomyopathy  . Chronic systolic heart failure ([place])    a. TEE 01/2012: EF 25-30%, diffuse hypokinesis. b. Not on ACEI due to renal insufficiency.;  c. follow up  echo 08/06/12: EF 25%, mod diast dysfxn, AVR ok, mild MR, mod LAE, mild RAE, mild to mod RV systolic dysfunction . HLD (hyperlipidemia)  . GERD (gastroesophageal reflux disease)  . Chronic kidney disease  . Pleural effusion    a. post-operatively after AVR/CABG s/p thoracentesis 03/2012 yielding 1L serosanguinous fluid. . Diabetes mellitus    borderline . Blood transfusion    NO REACTION TO TRANSFUSION . Cellulitis    a. RLE cellulitis 2 months post-operatively after AVR/CABG -  serratia marcessans, tx with I&D/antibiotics . Mitral regurgitation    Moderate by TEE 01/2012 . Flash pulmonary edema ([place])    Post-cath 12/2011, went into acute pulm edema requiring IV lasix and intubation . Baker's cyst 07/23/12   Incidental finding of LE venous dopplers . Cataract    right eye, hx of . HTN (hypertension)    primary, Dr. Hollace Kinnier . Myocardial infarction ([place]) 12/2011 . CHF (congestive heart failure) ([place])  . Stroke ([place]) 10/01   RIght Leg weakness . Cancer [facility]) '90's   Colon . Anemia  Past Surgical History:  Past Surgical History Procedure Laterality Date . Colon resection  1996 . Esophageal dilation   . Colonoscopy   . Cataract extraction     rt . Cardiac catheterization     1.3.13  stopped breathing, put on ventilator for 5-6 days . Tonsillectomy   . Aortic valve replacement  02/07/2012   Procedure: AORTIC VALVE REPLACEMENT (AVR);  Surgeon: Gaye Pollack, MD;  Location: [place];  Service: Open Heart Surgery;  Laterality: N/A; . Coronary artery bypass graft  02/07/2012   Procedure: CORONARY ARTERY BYPASS GRAFTING (CABG);  Surgeon: Gaye Pollack, MD;  Location: [place];  Service: Open Heart Surgery;  Laterality: N/A;  CABG x three; using right leg greater saphenous vein harvested endoscopically . Chest tube insertion  07/24/2012   Procedure: INSERTION PLEURAL DRAINAGE CATHETER;  Surgeon: Gaye Pollack, MD;  Location: [place];  Service: Thoracic;  Laterality: Left; . Talc pleurodesis  08/30/2012   Procedure: TALC PLEURADESIS;  Surgeon: Gaye Pollack, MD;  Location: [place];  Service: Thoracic;  Laterality: Left;  INSERTION OF TALC VIA LEFT PLEURX . Talc pleurodesis  09/12/2012   Procedure: TALC PLEURADESIS;  Surgeon: Gaye Pollack, MD;  Location: [place];  Service: Thoracic;  Laterality: Left; . Removal of pleural drainage catheter  09/27/2012   Procedure: REMOVAL OF PLEURAL DRAINAGE CATHETER;  Surgeon: Gaye Pollack, MD;  Location: [place];  Service: Thoracic;  Laterality: Left; Marland Kitchen Eye surgery  2009   cataract removed from right eye . Esophagogastroduodenoscopy N/A 07/03/2014   Procedure: ESOPHAGOGASTRODUODENOSCOPY (EGD);  Surgeon: Arta Silence, MD;  Location: [facility] ENDOSCOPY;  Service: Endoscopy;  Laterality: N/A; . Esophagoscopy with dilitation N/A 07/07/2014   Procedure: ESOPHAGOSCOPY WITH DILITATION/SAVARY DILATOR;  Surgeon: Rozetta Nunnery, MD;  Location: [place];  Service: ENT;  Laterality: N/A; . Ercp N/A 07/08/2014   Procedure: ENDOSCOPIC RETROGRADE CHOLANGIOPANCREATOGRAPHY (ERCP);  Surgeon: Missy Sabins, MD;  Location[facility]  ENDOSCOPY;  Service: Endoscopy;  Laterality: N/A; . Left heart catheterization with coronary angiogram N/A 12/13/2011   Procedure: LEFT HEART CATHETERIZATION WITH CORONARY ANGIOGRAM;  Surgeon: Peter M Martinique, MD;  [facility] LAB;  Service: Cardiovascular;  Laterality: N/A; . Inguinal hernia repair Right 09/13/2015   Procedure: OPEN RIGHT INGUINAL HERNIA;  Surgeon: Mickeal Skinner, MD;  Location: [place];  Service: General;  Laterality: Right; . Insertion of mesh Right 09/13/2015   Procedure: INSERTION OF MESH;  Surgeon: Arta Bruce Kinsinger, MD;  Location: [place];  Service: General;  Laterality: Right; HPI: Other Pertinent Information: Quavis Klutz is a 81 y.o. male with multiple medical problems not limited to CAD, ischemic cardiomyopathy, CKD, anemia of chronic disease and colon cancer, s/p resection, and pneumonia. He presented to the ED on 09/20/15 with complaints of constant non-radiating right sided chest pain. Also had hiccups since inhuinal repair on 10/3, chest pain worse with hiccup. CXR showed infiltration or atelectasis in the  left lung base behind the heart. Currently treated for suspected aspiration pneumonia v. HCAP. Pt has a history of cervical esophageal web and dilation, latest dilation July 2015.  Pt is NPO. No Data Recorded Assessment / Plan / Recommendation CHL IP CLINICAL IMPRESSIONS 09/21/2015 Therapy Diagnosis Moderate cervical esophageal phase dysphagia;Mild pharyngeal phase dysphagia;Mild oral phase dysphagia Clinical Impression Pt demonstrated mild oral and oropharyngeal and moderate cervical esophageal dysphagia characterized by delayed swallow initation and esophageal transit. Due to incomplete epiglottic closure and backflow from cervical esophagus, pooling in the pyriform, pt penetrated/aspirated thin liquids and penetrated nectar. Pt sensed aspiration but did not consistently sense penetration. Penetration/Aspiration was not improved by chin tuck, but was improved by hard  throat clear, expelled penetrate and phlegm. SLP provided moderate modeling and verbal cues to clear throat and swallow twice to clear valleculae and pyriform residue. The SLP cut the pill into four pieces, the pt did not clear the small portion of the pill  from the cervical esophagus with thin liquid, did clear with puree. Strongly recommend pills crushed in puree due to risk of stasis and backflow after pills, dysphagia 2 (fine chop) (due to decreased mastication of solids), and thin liquids with throat clear and second swallow. SLP will f/u with check for diet tolerance and compensatory strategy training.    CHL IP TREATMENT RECOMMENDATION 09/21/2015 Treatment Recommendations Therapy as outlined in treatment plan below   CHL IP DIET RECOMMENDATION 09/21/2015 SLP Diet Recommendations Dysphagia 2 (Fine chop);Thin Liquid Administration via (None) Medication Administration Crushed with puree Compensations Small sips/bites;Slow rate;Multiple dry swallows after each bite/sip;Follow solids with liquid;Clear throat intermittently Postural Changes and/or Swallow Maneuvers (None)   CHL IP OTHER RECOMMENDATIONS 09/21/2015 Recommended Consults (None) Oral Care Recommendations Oral care QID Other Recommendations (None)   No flowsheet data found.  CHL IP FREQUENCY AND DURATION 09/21/2015 Speech Therapy Frequency (ACUTE ONLY) min 2x/week Treatment Duration 2 weeks   Pertinent Vitals/Pain NA  SLP Swallow Goals No flowsheet data found. No flowsheet data found.   CHL IP REASON FOR REFERRAL 09/21/2015 Reason for Referral Objectively evaluate swallowing function   CHL IP ORAL PHASE 09/21/2015 Lips (None) Tongue (None) Mucous membranes (None) Nutritional status (None) Other (None) Oxygen therapy (None) Oral Phase Impaired Oral - Pudding Teaspoon (None) Oral - Pudding Cup (None) Oral - Honey Teaspoon (None) Oral - Honey Cup (None) Oral - Honey Syringe (None) Oral - Nectar Teaspoon (None) Oral - Nectar Cup (None) Oral - Nectar Straw  (None) Oral - Nectar Syringe (None) Oral - Ice Chips (None) Oral - Thin Teaspoon (None) Oral - Thin Cup (None) Oral - Thin Straw (None) Oral - Thin Syringe (None) Oral - Puree (None) Oral - Mechanical Soft (None) Oral - Regular (None) Oral - Multi-consistency (None) Oral - Pill (None) Oral Phase - Comment (None)   CHL IP PHARYNGEAL PHASE 09/21/2015 Pharyngeal Phase Impaired Pharyngeal - Pudding Teaspoon (None) Penetration/Aspiration details (pudding teaspoon) (None) Pharyngeal - Pudding Cup (None) Penetration/Aspiration details (pudding cup) (None) Pharyngeal - Honey Teaspoon (None) Penetration/Aspiration details (honey teaspoon) (None) Pharyngeal - Honey Cup (None) Penetration/Aspiration details (honey cup) (None) Pharyngeal - Honey Syringe (None) Penetration/Aspiration details (honey syringe) (None) Pharyngeal - Nectar Teaspoon (None) Penetration/Aspiration details (nectar teaspoon) (None) Pharyngeal - Nectar Cup (None) Penetration/Aspiration details (nectar cup) (None) Pharyngeal - Nectar Straw (None) Penetration/Aspiration details (nectar straw) (None) Pharyngeal - Nectar Syringe (None) Penetration/Aspiration details (nectar syringe) (None) Pharyngeal - Ice Chips (None) Penetration/Aspiration details (ice chips) (None) Pharyngeal - Thin Teaspoon (None) Penetration/Aspiration details (thin teaspoon) (  None) Pharyngeal - Thin Cup (None) Penetration/Aspiration details (thin cup) (None) Pharyngeal - Thin Straw (None) Penetration/Aspiration details (thin straw) (None) Pharyngeal - Thin Syringe (None) Penetration/Aspiration details (thin syringe') (None) Pharyngeal - Puree (None) Penetration/Aspiration details (puree) (None) Pharyngeal - Mechanical Soft (None) Penetration/Aspiration details (mechanical soft) (None) Pharyngeal - Regular (None) Penetration/Aspiration details (regular) (None) Pharyngeal - Multi-consistency (None) Penetration/Aspiration details (multi-consistency) (None) Pharyngeal - Pill (None)  Penetration/Aspiration details (pill) (None) Pharyngeal Comment (None)   CHL IP CERVICAL ESOPHAGEAL PHASE 09/21/2015 Cervical Esophageal Phase Impaired Pudding Teaspoon (None) Pudding Cup (None) Honey Teaspoon (None) Honey Cup (None) Honey Straw (None) Nectar Teaspoon (None) Nectar Cup Esophageal backflow into the pharynx;Esophageal backflow into cervical esophagus Nectar Straw (None) Nectar Sippy Cup (None) Thin Teaspoon (None) Thin Cup Esophageal backflow into the pharynx;Esophageal backflow into cervical esophagus Thin Straw Esophageal backflow into the pharynx;Esophageal backflow into cervical esophagus Thin Sippy Cup (None) Cervical Esophageal Comment (None) CHL IP GO 09/21/2015 Functional Assessment Tool Used clinical judgement Functional Limitations Swallowing Swallow Current Status (I9485) CJ Swallow Goal Status (I6270) CI Swallow Discharge Status (J5009) (None) Motor Speech Current Status (F8182) (None) Motor Speech Goal Status (X9371) (None) Motor Speech Goal Status (I9678) (None) Spoken Language Comprehension Current Status (L3810) (None) Spoken Language Comprehension Goal Status (F7510) (None) Spoken Language Comprehension Discharge Status [phone]) (None) Spoken Language Expression Current Status [phone]) (None) Spoken Language Expression Goal Status [phone]) (None) Spoken Language Expression Discharge Status [phone]) (None) Attention Current Status (V4008) (None) Attention Goal Status (Q7619) (None) Attention Discharge Status [phone]) (None) Memory Current Status (I7124) (None) Memory Goal Status (P8099) (None) Memory Discharge Status (I3382) (None) Voice Current Status (N0539) (None) Voice Goal Status (J6734) (None) Voice Discharge Status [phone]) (None) Other Speech-Language Pathology Functional Limitation [phone]) (None) Other Speech-Language Pathology Functional Limitation Goal Status (B3532) (None) Other Speech-Language Pathology Functional Limitation Discharge Status [phone]) (None) Completed by Lanier Ensign, SLP Student Supervised and reviewed by Herbie Baltimore MA CCC-SLP   DeBlois, Katherene Ponto 09/21/2015, 3:12 PM   Objective  Filed Vitals:   09/21/15 1153 09/21/15 2000 09/22/15 0200 09/22/15 0500  BP: 140/53 131/42 126/38 131/39  Pulse: 58 55 58 57  Temp: 99 F (37.2 C) 98.4 F (36.9 C) 99.7 F (37.6 C) 98.6 F (37 C)  TempSrc: Oral Oral Oral Oral  Resp: 34 22 20 20   Height:      Weight:    72.2 kg (159 lb 2.8 oz)  SpO2: 98% 98% 94% 96%    Intake/Output Summary (Last 24 hours) at 09/22/15 1005 Last data filed at 09/22/15 0810  Gross per 24 hour  Intake    480 ml  Output      0 ml  Net    480 ml   Filed Weights   09/20/15 0600 09/21/15 0333 09/22/15 0500  Weight: 71.9 kg (158 lb 8.2 oz) 71.6 kg (157 lb 13.6 oz) 72.2 kg (159 lb 2.8 oz)   Exam:  GENERAL: NAD-has consistently hiccupped In the Room  HEENT: head NCAT, no scleral icterus. Pupils round and reactive. Mucous membranes are moist.   NECK: Supple. No LAD  LUNGS: Clear to auscultation. No wheezing or crackles  HEART: Regular rate and rhythm. 2+ pulses, no JVD, no peripheral edema  ABDOMEN: Soft, non-distended, non-tender. Positive bowel sounds. Sutures and area of prior surgery noted on 09/22/15 and without any issue  EXTREMITIES: Without any cyanosis or clubbing. Good muscle tone  NEUROLOGIC: Alert and oriented. Grossly non focal. Strength 5/5 in all 4.   Data Reviewed: Basic  Metabolic Panel:  Recent Labs Lab 09/20/15 0546 09/20/15 1550 09/21/15 0401 09/22/15 0521  NA 137  --  135 136  K 4.4  --  3.9 3.9  CL 97*  --  96* 98*  CO2 29  --  29 25  GLUCOSE 296*  --  190* 127*  BUN 55*  --  55* 51*  CREATININE 2.67*  --  2.69* 2.87*  CALCIUM 8.8*  --  8.5* 8.2*  PHOS  --  2.7  --   --    Liver Function Tests:  Recent Labs Lab 09/21/15 0401  AST 26  ALT 19  ALKPHOS 65  BILITOT 1.6*  PROT 6.1*  ALBUMIN 2.4*   CBC:  Recent Labs Lab 09/20/15 0546 09/20/15 1550 09/21/15 0401  09/21/15 0843 09/22/15 0521  WBC 8.6 10.5 8.8 10.0 8.3  NEUTROABS 6.8  --   --   --   --   HGB 7.4* 8.2* 9.8* 9.8* 9.4*  HCT 23.3* 24.7* 29.5* 29.7* 28.6*  MCV 101.3* 99.6 99.7 99.7 99.7  PLT 180 155 164 168 PLATELET CLUMPS NOTED ON SMEAR, COUNT APPEARS ADEQUATE   Cardiac Enzymes:  Recent Labs Lab 09/20/15 0546 09/20/15 1055 09/20/15 1318  TROPONINI 0.28* 0.14* 0.11*   CBG:  Recent Labs Lab 09/21/15 1658 09/21/15 2000 09/22/15 0034 09/22/15 0512 09/22/15 0749  GLUCAP 158* 196* 172* 129* 138*   Scheduled Meds: . antiseptic oral rinse  7 mL Mouth Rinse BID  . aspirin  324 mg Oral Once  . carvedilol  25 mg Oral BID WC  . furosemide  80 mg Oral Daily  . heparin subcutaneous  5,000 Units Subcutaneous 3 times per day  . hydrALAZINE  25 mg Oral TID  . latanoprost  1 drop Both Eyes QHS  . living well with diabetes book   Does not apply Once  . magic mouthwash  10 mL Oral QID  . pantoprazole  40 mg Oral Q0600  . piperacillin-tazobactam (ZOSYN)  IV  2.25 g Intravenous Q6H  . sodium chloride  3 mL Intravenous Q12H  . tamsulosin  0.4 mg Oral Daily  . vancomycin  1,000 mg Intravenous Q48H   Continuous Infusions:   Nita Sells, MD Marzetta Board, MD [facility] Pager [phone]. If 7 PM - 7 AM, please contact night-coverage at [email or web address], password [facility] 09/22/2015, 10:05 AM  LOS: 2 days

## 2015-09-22 NOTE — Progress Notes (Signed)
Notified Dr. Verlon Au BS 202 and 203 today. Did not want to reorder sliding scale. Will watch trend tomorrow and address if needed. Continue to monitor. Carroll Kinds RN

## 2015-09-22 NOTE — Evaluation (Signed)
Physical Therapy Evaluation Patient Details Name: [name] [name] MRN: [id number] DOB: Apr 13, 1926 Today's Date: 09/22/2015   History of Present Illness  [name] [name] is a 81 y.o. male with multiple medical problems not limited to CAD, ischemic cardiomyopathy, CKD, anemia of chronic disease and colon cancer, s/p resection, and pneumonia. Admitted on 09/20/15 w/ c/o constant non-radiating right sided chest pain. CXR showed infiltration or atelectasis in the left lung base behind the heart. Currently treated for suspected aspiration pneumonia v. HCAP.   Clinical Impression  Pt admitted with above diagnosis. Pt currently with functional limitations due to the deficits listed below (see PT Problem List). [name] [name] lives at home w/ his wife who is unable to provide physical assist at d/c and pt will need to go SNF.  He required min assist for all mobility and is at increased risk for falls 2/2 generalize weakness. Pt will benefit from skilled PT to increase their independence and safety with mobility to allow discharge to the venue listed below.      Follow Up Recommendations SNF;Supervision for mobility/OOB    Equipment Recommendations  Other (comment) (TBD at next venue of care)    Recommendations for Other Services       Precautions / Restrictions Precautions Precautions: Fall Precaution Comments: h/o 2 falls w/in the past few weeks 2/2 effects of pain medicine following surgery. Restrictions Weight Bearing Restrictions: No      Mobility  Bed Mobility Overal bed mobility: Needs Assistance Bed Mobility: Supine to Sit     Supine to sit: Min assist     General bed mobility comments: Pt uses bed rail w/ increased time and assist w/ bed pad to scoot to EOB.    Transfers Overall transfer level: Needs assistance Equipment used: Rolling walker (2 wheeled) Transfers: Sit to/from [facility] Sit to Stand: Min assist Stand pivot transfers: Min assist        General transfer comment: Min assist to power up to standing and control sitting as well as managing RW, pt unsteady. Cues for hand placement during sit<>stand.  Ambulation/Gait             General Gait Details: Was ready to attempt ambulation in room; however, pt w/ stool incontinence.  Stairs            Wheelchair Mobility    Modified Rankin (Stroke Patients Only)       Balance Overall balance assessment: Needs assistance;History of Falls Sitting-balance support: Bilateral upper extremity supported;Feet supported Sitting balance-Leahy Scale: Fair     Standing balance support: Bilateral upper extremity supported;During functional activity Standing balance-Leahy Scale: Poor Standing balance comment: Relies on RW for support                             Pertinent Vitals/Pain Pain Assessment: No/denies pain    Home Living Family/patient expects to be discharged to:: Skilled nursing facility Living Arrangements: Spouse/significant other               Additional Comments: From home w/ spouse who is unable to physically assist him.  Has felt increasingly weak since his inguinal hernia repair on 09/13/15.      Prior Function Level of Independence: Independent with assistive device(s)         Comments: Prior to hernia repair he was Ind w/o AD but since Sunday has been using a RW that his daughter bought for him after he fell.  Hand Dominance        Extremity/Trunk Assessment   Upper Extremity Assessment: Generalized weakness           Lower Extremity Assessment: Generalized weakness         Communication   Communication: No difficulties  Cognition Arousal/Alertness: Awake/alert Behavior During Therapy: WFL for tasks assessed/performed Overall Cognitive Status: Within Functional Limits for tasks assessed                      General Comments General comments (skin integrity, edema, etc.): Pt on bed pan in chair at  end of session and RN notified.  Daughter in room throughout duration of session and end of session. Discussed w/ pt d/c plan for SNF and pt and pt's daughter agreeable. SpO2 remains above 93% on RA during all mobility.    Exercises General Exercises - Lower Extremity Ankle Circles/Pumps: AROM;Both;10 reps;Supine      Assessment/Plan    PT Assessment Patient needs continued PT services  PT Diagnosis Difficulty walking;Abnormality of gait;Generalized weakness;Acute pain   PT Problem List Decreased strength;Decreased activity tolerance;Decreased balance;Decreased mobility;Decreased knowledge of use of DME;Decreased safety awareness;Decreased knowledge of precautions  PT Treatment Interventions DME instruction;Gait training;Functional mobility training;Therapeutic activities;Therapeutic exercise;Balance training;Neuromuscular re-education;Patient/family education   PT Goals (Current goals can be found in the Care Plan section) Acute Rehab PT Goals Patient Stated Goal: to go to rehab then home PT Goal Formulation: With patient/family Time For Goal Achievement: 10/13/15 Potential to Achieve Goals: Fair    Frequency Min 3X/week   Barriers to discharge Decreased caregiver support Wife unable to assist at d/c    Co-evaluation               End of Session Equipment Utilized During Treatment: Gait belt;Oxygen               Time: 4166-0630 PT Time Calculation (min) (ACUTE ONLY): 26 min   Charges:   PT Evaluation $Initial PT Evaluation Tier I: 1 Procedure PT Treatments $Therapeutic Activity: 8-22 mins   PT G Codes:       Joslyn Hy PT, DPT [phone] Pager: [phone] 09/22/2015, 5:01 PM

## 2015-09-23 DIAGNOSIS — R0789 Other chest pain: Secondary | ICD-10-CM

## 2015-09-23 DIAGNOSIS — I25110 Atherosclerotic heart disease of native coronary artery with unstable angina pectoris: Secondary | ICD-10-CM

## 2015-09-23 LAB — CBC WITH DIFFERENTIAL/PLATELET
BASOS ABS: 0 10*3/uL (ref 0.0–0.1)
BASOS PCT: 0 %
EOS PCT: 3 %
Eosinophils Absolute: 0.2 10*3/uL (ref 0.0–0.7)
HEMATOCRIT: 29.8 % — AB (ref 39.0–52.0)
Hemoglobin: 9.6 g/dL — ABNORMAL LOW (ref 13.0–17.0)
LYMPHS PCT: 14 %
Lymphs Abs: 1.1 10*3/uL (ref 0.7–4.0)
MCH: 32 pg (ref 26.0–34.0)
MCHC: 32.2 g/dL (ref 30.0–36.0)
MCV: 99.3 fL (ref 78.0–100.0)
Monocytes Absolute: 0.4 10*3/uL (ref 0.1–1.0)
Monocytes Relative: 5 %
NEUTROS ABS: 6.3 10*3/uL (ref 1.7–7.7)
NEUTROS PCT: 78 %
PLATELETS: 195 10*3/uL (ref 150–400)
RBC: 3 MIL/uL — AB (ref 4.22–5.81)
RDW: 14.1 % (ref 11.5–15.5)
WBC: 8 10*3/uL (ref 4.0–10.5)

## 2015-09-23 LAB — BASIC METABOLIC PANEL
ANION GAP: 9 (ref 5–15)
BUN: 53 mg/dL — ABNORMAL HIGH (ref 6–20)
CALCIUM: 8.4 mg/dL — AB (ref 8.9–10.3)
CO2: 28 mmol/L (ref 22–32)
Chloride: 98 mmol/L — ABNORMAL LOW (ref 101–111)
Creatinine, Ser: 2.82 mg/dL — ABNORMAL HIGH (ref 0.61–1.24)
GFR, EST AFRICAN AMERICAN: 22 mL/min — AB (ref >60)
GFR, EST NON AFRICAN AMERICAN: 19 mL/min — AB (ref >60)
GLUCOSE: 215 mg/dL — AB (ref 65–99)
POTASSIUM: 3.5 mmol/L (ref 3.5–5.1)
Sodium: 135 mmol/L (ref 135–145)

## 2015-09-23 LAB — GLUCOSE, CAPILLARY
GLUCOSE-CAPILLARY: 266 mg/dL — AB (ref 65–99)
GLUCOSE-CAPILLARY: 321 mg/dL — AB (ref 65–99)
GLUCOSE-CAPILLARY: 213 mg/dL — AB (ref 65–99)
Glucose-Capillary: 199 mg/dL — ABNORMAL HIGH (ref 65–99)

## 2015-09-23 MED ORDER — GLIMEPIRIDE 1 MG PO TABS: 1.0000 mg | ORAL_TABLET | Freq: Every day | ORAL | Status: DC

## 2015-09-23 MED ORDER — INSULIN ASPART 100 UNIT/ML ~~LOC~~ SOLN
3.0000 [IU] | Freq: Three times a day (TID) | SUBCUTANEOUS | Status: DC
  Administered 2015-09-24: 3 [IU] via SUBCUTANEOUS

## 2015-09-23 MED ORDER — CHLORPROMAZINE HCL 10 MG PO TABS: 10.0000 mg | ORAL_TABLET | Freq: Three times a day (TID) | ORAL | Status: DC

## 2015-09-23 MED ORDER — ALPRAZOLAM 0.25 MG PO TABS: 0.2500 mg | ORAL_TABLET | Freq: Two times a day (BID) | ORAL | Status: DC | PRN

## 2015-09-23 MED ORDER — INSULIN ASPART 100 UNIT/ML ~~LOC~~ SOLN
0.0000 [IU] | Freq: Three times a day (TID) | SUBCUTANEOUS | Status: DC
  Administered 2015-09-23: 7 [IU] via SUBCUTANEOUS
  Administered 2015-09-24: 1 [IU] via SUBCUTANEOUS

## 2015-09-23 MED ORDER — POLYETHYLENE GLYCOL 3350 17 G PO PACK: 17.0000 g | PACK | Freq: Every day | ORAL | Status: DC | PRN

## 2015-09-23 MED ORDER — INSULIN DETEMIR 100 UNIT/ML ~~LOC~~ SOLN
5.0000 [IU] | Freq: Every day | SUBCUTANEOUS | Status: DC
  Administered 2015-09-23: 5 [IU] via SUBCUTANEOUS
  Filled 2015-09-23 (×2): qty 0.05

## 2015-09-23 MED ORDER — AMOXICILLIN-POT CLAVULANATE 500-125 MG PO TABS: 1.0000 | ORAL_TABLET | Freq: Two times a day (BID) | ORAL | Status: DC

## 2015-09-23 MED ORDER — GUAIFENESIN-DM 100-10 MG/5ML PO SYRP: 10.0000 mL | ORAL_SOLUTION | Freq: Four times a day (QID) | ORAL | Status: DC

## 2015-09-23 MED ORDER — OXYCODONE HCL 5 MG PO TABS: 5.0000 mg | ORAL_TABLET | ORAL | Status: DC | PRN

## 2015-09-23 NOTE — Progress Notes (Signed)
OT Cancellation Note  Patient Details Name: [name] [name] MRN: [id number] DOB: 1926/06/02   Cancelled Treatment:    Reason Eval/Treat Not Completed: OT screened, no needs identified, will sign off. Plan is for patient to discharge > SNF today. No acute OT needs identified, all needs can be met in SNF. Please send text page to OT services if any questions, concerns, or with new orders: [phone] OR call office at [phone]. Thank you for the order.    Marijo Quizon , MS, OTR/L, CLT Pager: [phone]  09/23/2015, 10:42 AM

## 2015-09-23 NOTE — Care Management Important Message (Signed)
Important Message  Patient Details  Name: [name] [name] MRN: [id number] Date of Birth: 12/17/1925   Medicare Important Message Given:  Yes-second notification given    Nathen May 09/23/2015, 10:52 AM

## 2015-09-23 NOTE — Discharge Summary (Addendum)
Patient seen examined and d/c 10/13-no significant changed since.  Patient doing well without issue and ready for SNF transfer  Physician Discharge Summary  [name] [name] CVE:[id number] DOB: [birth year] DOA: 09/20/2015  PCP: Hollace Kinnier, DO  Admit date: 09/20/2015 Discharge date: 09/23/2015  Time spent: 40 minutes  Recommendations for Outpatient Follow-up:  1. Recommend continued use of Thorazine for hiccups-can try to wean off within 5 days maybe on 09/28/15 2. Continue Lasix at current dose of 80 mg and please get basic metabolic panel in one week 3. Get daily weights at facility and dose extra Lasix 80 mg if more fluid buildup as noted on discharge-discharge weight was 159 pounds 4. will need to complete Augmentinon 10/17 for likely aspiration pneumonia 5. Patient's A1c was locally 7.7 patient has history of glucose intolerance and was hence started on Amaryl 1 mg with breakfast which may need to be uptitrated as an outpatient. 6. Patient not a candidate for dialysis and does not wish further renal replacement therapy going forward and has chronic kidney disease with baseline creatinine 2.2. Would recommend going forward with his primary care physician he address goals of care and otherissues as he is tenuous and there is a real possibility he will have a decline over the next 6 to 12 months 7. Please get complete metabolic panel 1 week.     Discharge Diagnoses:  Principal Problem:   Chest pain Active Problems:   Essential hypertension   Acute on chronic renal failure (HCC)   Anemia due to chronic kidney disease   Atherosclerosis of native coronary artery-- s/p CABG   Chronic combined systolic and diastolic heart failure, NYHA class 2 (HCC)   Ischemic cardiomyopathy   S/P AVR (aortic valve replacement)   Chronic kidney disease, stage III (moderate): Cr ~2.2 baseline   Diabetes mellitus type 2, controlled, with complications (HCC)   S/P CABG x 3: LIMA-LAD, SVG-D2, SVG-rPDA    Candidiasis of mouth   Elevated troponin   Cough   Discharge Condition: fair  Diet recommendation: heart healthy low-salt  Filed Weights   09/21/15 0333 09/22/15 0500 09/23/15 0414  Weight: 71.6 kg (157 lb 13.6 oz) 72.2 kg (159 lb 2.8 oz) 75.751 kg (167 lb)    History of present illness: 81 y.o. ?  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 Wife states he was clammy overnight when the pain began.   Pain is aggravated by coughing, hiccups (which he has had constantly since hernia repair 1 week ago), movement, and pressing on the area. No associated shortness of breath. His troponin is elevated but patient has acute on chronic kidney disease.   Appetite has been poor since hernia repair a week ago. Endorses cough, yellow phlegm. No fevers.    In ED, VSS. Troponin elevated, no acute changes on EKG. Cardiology evaluated and started patient on IV heparin. Patient found to have acute on chronic anemia, hemoglobin 7.4. He had a significant drop in hgb (12 >>>9.4) following inguinal hernia repair on 10/4.  Hospital Course:   Chest pain/Elevated troponin - Likely musculoskeletal etiology, with history of recent fall with blunt trauma to chest and abdomen, no acute EKG changes, and downward trending troponins (now at 0.11 from 0.28) - Cardiology followingand signed off as no further complaints and with downtrending troponins unlikely to be ischemic  event  HCAP - With persistent productive cough   - Chest x-ray showing left sided opacity - Vancomycin and zosyn initiated - Febrile 09/20/15 to as hgh as 100.6, now afebrile with no leukocytosis therefore antibiotics transitioned on 10/12 to by mouth Augmentin to complete a course on 09/27/15 -afebrile on discharge  Essential hypertension - Stable, continue Coreg 25 twice a day, Lasix 80 daily, hydralazine 25 3 times a day -His Imdur has been held  Acute on chronic anemia - Followed by hematology (Dr. Alen Blew) for anemia of chronic disease. Baseline hemoglobin mid 11 to mid 12 - Hg improved from 7.4 to 9.8-->9.4 -Received 2 units of blood this admission  AKI/Chronic kidney disease, stage IV    - Creatinine still above baseline. Patient's diurectics were temporarily increased last week after he had gained a few pounds postoperatively.   - Nephrology consulted and recommending no further changes, outpatient PCP f/u - d/c fluids  CAD, s/p CABG - Stable, continue current regimen  Chronic combined systolic and diastolic heart failure, NYHA class 2 ([name]) - Stable, no evidence for decompensation   - Last echo April 2014 revealed EF of 40%. Repeat echo showed EF of 40-45% with grade 2 diastolic dysfunction and bioprosthesis with PA peak pressure 54 mmHg - Cardiology followed - Continue Lasix 80 mg daily, beta blockers     Diabetes mellitus type 2, controlled - Hgb A1c 6.6 in March 2016, repeat A1c came back at 7.7 -CBGs are ranging in the low 200 range so started low-dose Amaryl 1 mg  Hiccups - PPI trial, no improvement thus far - started on Thorazine for this admission which has improved significantly patient's hiccups  Dysphagia2, should be on dysphagia 2 diet - Likely inflammatory secondary to intubation last week during hernia repair procedure, also hx of cervical esophageal web status post dilatation - Was given a trial of magic mouthwash with no improvement - Barium swallow  recommended 10/11 = moderate cervical dysphagia due to incomplete epiglottic closure -  - Needs dysphagia 2 diet    Discharge Exam: Filed Vitals:   09/23/15 0414  BP: 155/49  Pulse: 58  Temp: 98.2 F (36.8 C)  Resp: 18    General: EOMI NCAT Cardiovascular: S1-S2 no murmur rub or gallop Respiratory: clinically clear no added sound abdomen soft nontender nondistended No lower extremity edema   Discharge Instructions    Current Discharge Medication List    START taking these medications   Details  ALPRAZolam (XANAX) 0.25 MG tablet Take 1 tablet (0.25 mg total) by mouth 2 (two) times daily as needed for anxiety. Qty: 30 tablet, Refills: 0    amoxicillin-clavulanate (AUGMENTIN) 500-125 MG tablet Take 1 tablet (500 mg total) by mouth 2 (two) times daily. Qty: 8 tablet, Refills: 0    chlorproMAZINE (THORAZINE) 10 MG tablet Take 1 tablet (10 mg total) by mouth 3 (three) times daily. Qty: 15 tablet, Refills: 0    glimepiride (AMARYL) 1 MG tablet Take 1 tablet (1 mg total) by mouth daily with breakfast. Qty: 30 tablet, Refills: 0    guaiFENesin-dextromethorphan (ROBITUSSIN DM) 100-10 MG/5ML syrup Take 10 mLs by mouth every 6 (six) hours. Qty: 118 mL, Refills: 0  polyethylene glycol (MIRALAX / GLYCOLAX) packet Take 17 g by mouth daily as needed for mild constipation. Qty: 14 each, Refills: 0      CONTINUE these medications which have CHANGED   Details  oxyCODONE (OXY IR/ROXICODONE) 5 MG immediate release tablet Take 1-2 tablets (5-10 mg total) by mouth every 4 (four) hours as needed for moderate pain. Qty: 30 tablet, Refills: 0      CONTINUE these medications which have NOT CHANGED   Details  aspirin EC 325 MG tablet Take 325 mg by mouth daily.     carvedilol (COREG) 25 MG tablet TAKE 1 TABLET BY MOUTH TWICE A DAY WITH A MEAL Qty: 180 tablet, Refills: 0    cholecalciferol (VITAMIN D) 1000 UNITS tablet Take 1,000 Units by mouth daily with supper.     furosemide  (LASIX) 40 MG tablet Take 2 tablets (80 mg total) by mouth daily. Qty: 180 tablet, Refills: 1    hydrALAZINE (APRESOLINE) 25 MG tablet TAKE 1 TABLET BY MOUTH 3 TIMES A DAY Qty: 270 tablet, Refills: 3    isosorbide mononitrate (IMDUR) 60 MG 24 hr tablet TAKE 1 TABLET BY MOUTH EVERY DAY Qty: 30 tablet, Refills: 6    Multiple Vitamin (MULTIVITAMIN WITH MINERALS) TABS tablet Take 1 tablet by mouth daily.    omeprazole (PRILOSEC) 20 MG capsule Take 20 mg by mouth daily.     tamsulosin (FLOMAX) 0.4 MG CAPS capsule TAKE 1 CAPSULE BY MOUTH ONCE DAILY AFTER SUPPER Qty: 30 capsule, Refills: 5    Travoprost, BAK Free, (TRAVATAN) 0.004 % SOLN ophthalmic solution Place 1 drop into both eyes daily.     AMBULATORY NON FORMULARY MEDICATION Potassium 40 meq Take one tablet by mouth once daily for 3 days Qty: 3 tablet, Refills: 0    nitroGLYCERIN (NITROSTAT) 0.4 MG SL tablet Place 0.4 mg under the tongue every 5 (five) minutes as needed for chest pain.    Tdap (BOOSTRIX) 5-2.5-18.5 LF-MCG/0.5 injection Inject 0.5 mLs into the muscle once. Qty: 0.5 mL, Refills: 0      STOP taking these medications     Prenatal Vit-Fe Fumarate-FA (MULTIVITAMIN-PRENATAL) 27-0.8 MG TABS      amoxicillin (AMOXIL) 500 MG capsule        Allergies  Allergen Reactions  . Statins Other (See Comments)    Pain/weakness in legs   Follow-up Information    Follow up with Loralie Champagne, MD On 10/06/2015.   Specialty:  Cardiology   Why:  @3 :40   Contact information:   [address] [place] [address] [phone]        The results of significant diagnostics from this hospitalization (including imaging, microbiology, ancillary and laboratory) are listed below for reference.    Significant Diagnostic Studies: Dg Chest 2 View  09/20/2015  CLINICAL DATA:  Right-sided chest pain since 10/30 last night. Shortness of breath. History of hypertension. EXAM: CHEST  2 VIEW COMPARISON:  06/28/2014  FINDINGS: Postoperative changes in the mediastinum. Cardiac enlargement. No pulmonary vascular congestion. There is infiltration or atelectasis in the left lung base behind the heart. No blunting of costophrenic angles. No pneumothorax. Mediastinal contours appear intact. Calcification of the aorta. Degenerative changes in the spine. IMPRESSION: Infiltration or atelectasis in the left lung base behind the heart. Electronically Signed   By: Lucienne Capers M.D.   On: 09/20/2015 06:30   Dg Chest Port 1 View  09/20/2015  CLINICAL DATA:  Tachypnea. EXAM: PORTABLE CHEST 1 VIEW COMPARISON:  09/20/2015 at  6:04 a.m. FINDINGS: There changes from cardiac surgery, stable. Cardiac silhouette is mildly enlarged. Left lung base streaky opacity noted on the prior study is stable. Mild prominence of the bronchovascular markings. Lungs otherwise clear. No convincing pleural effusion and no pneumothorax. No mediastinal or hilar masses or convincing adenopathy. IMPRESSION: 1. No convincing change from the earlier study. 2. Opacity noted behind the left heart on the prior study is unchanged. This is most likely atelectasis. Pneumonia not excluded. No evidence of pulmonary edema. Electronically Signed   By: Lajean Manes M.D.   On: 09/20/2015 19:00   Dg Swallowing Func-speech Pathology  09/21/2015  Objective Swallowing Evaluation:   Patient Details Name: [name] [name] MRN: [id number] Date of Birth: Aug 31, 1926 Today's Date: 09/21/2015 Time: SLP Start Time (ACUTE ONLY): 1330-SLP Stop Time (ACUTE ONLY): 1400 SLP Time Calculation (min) (ACUTE ONLY): 30 min Past Medical History: Past Medical History Diagnosis Date . History of colon cancer    s/p colon resection . Aortic stenosis    a. s/p AVR with 39mm Edwards pericardial valve 02/07/12 - post-op course complicated by pleural effusion requring thoracentesis/leg cellulitis 03/2012. Marland Kitchen CAD (coronary artery disease)    a. s/p NSTEMI 12/2011:  LHC - Ostial left main 20%, ostial LAD 50%,  mid 60-70%, ostial D1 40% and mid 40%, D2 70%, ostial circumflex occluded, ostial RCA 80-90%, LVEDP was 42. b.  s/p CABG x 3 at time of AVR (LiMA-LAD, SVG-2nd daigonal, SVG-PDA) 02/07/12 (post-op course noted above). . Ischemic cardiomyopathy  . Chronic systolic heart failure ([place])    a. TEE 01/2012: EF 25-30%, diffuse hypokinesis. b. Not on ACEI due to renal insufficiency.;  c. follow up  echo 08/06/12: EF 25%, mod diast dysfxn, AVR ok, mild MR, mod LAE, mild RAE, mild to mod RV systolic dysfunction . HLD (hyperlipidemia)  . GERD (gastroesophageal reflux disease)  . Chronic kidney disease  . Pleural effusion    a. post-operatively after AVR/CABG s/p thoracentesis 03/2012 yielding 1L serosanguinous fluid. . Diabetes mellitus    borderline . Blood transfusion    NO REACTION TO TRANSFUSION . Cellulitis    a. RLE cellulitis 2 months post-operatively after AVR/CABG - serratia marcessans, tx with I&D/antibiotics . Mitral regurgitation    Moderate by TEE 01/2012 . Flash pulmonary edema ([place])    Post-cath 12/2011, went into acute pulm edema requiring IV lasix and intubation . Baker's cyst 07/23/12   Incidental finding of LE venous dopplers . Cataract    right eye, hx of . HTN (hypertension)    primary, Dr. Hollace Kinnier . Myocardial infarction ([place]) 12/2011 . CHF (congestive heart failure) ([place])  . Stroke ([place]) 10/01   RIght Leg weakness . Cancer [facility]) '90's   Colon . Anemia  Past Surgical History: Past Surgical History Procedure Laterality Date . Colon resection  1996 . Esophageal dilation   . Colonoscopy   . Cataract extraction     rt . Cardiac catheterization     1.3.13  stopped breathing, put on ventilator for 5-6 days . Tonsillectomy   . Aortic valve replacement  02/07/2012   Procedure: AORTIC VALVE REPLACEMENT (AVR);  Surgeon: Gaye Pollack, MD;  Location: [facility];  Service: Open Heart Surgery;  Laterality: N/A; . Coronary artery bypass graft  02/07/2012   Procedure: CORONARY ARTERY BYPASS GRAFTING (CABG);  Surgeon: Gaye Pollack, MD;  Location: [facility];  Service: Open Heart Surgery;  Laterality: N/A;  CABG x three; using right leg greater saphenous vein harvested endoscopically . Chest tube insertion  07/24/2012   Procedure: INSERTION PLEURAL DRAINAGE CATHETER;  Surgeon: Gaye Pollack, MD;  Location: [facility];  Service: Thoracic;  Laterality: Left; . Talc pleurodesis  08/30/2012   Procedure: TALC PLEURADESIS;  Surgeon: Gaye Pollack, MD;  Location: [facility];  Service: Thoracic;  Laterality: Left;  INSERTION OF TALC VIA LEFT PLEURX . Talc pleurodesis  09/12/2012   Procedure: TALC PLEURADESIS;  Surgeon: Gaye Pollack, MD;  Location: [facility];  Service: Thoracic;  Laterality: Left; . Removal of pleural drainage catheter  09/27/2012   Procedure: REMOVAL OF PLEURAL DRAINAGE CATHETER;  Surgeon: Gaye Pollack, MD;  Location: [facility];  Service: Thoracic;  Laterality: Left; Marland Kitchen Eye surgery  2009   cataract removed from right eye . Esophagogastroduodenoscopy N/A 07/03/2014   Procedure: ESOPHAGOGASTRODUODENOSCOPY (EGD);  Surgeon: Arta Silence, MD;  Location[facility] ENDOSCOPY;  Service: Endoscopy;  Laterality: N/A; . Esophagoscopy with dilitation N/A 07/07/2014   Procedure: ESOPHAGOSCOPY WITH DILITATION/SAVARY DILATOR;  Surgeon: Rozetta Nunnery, MD;  Location: [facility];  Service: ENT;  Laterality: N/A; . Ercp N/A 07/08/2014   Procedure: ENDOSCOPIC RETROGRADE CHOLANGIOPANCREATOGRAPHY (ERCP);  Surgeon: Missy Sabins, MD;  Location: [facility] ENDOSCOPY;  Service: Endoscopy;  Laterality: N/A; . Left heart catheterization with coronary angiogram N/A 12/13/2011   Procedure: LEFT HEART CATHETERIZATION WITH CORONARY ANGIOGRAM;  Surgeon: Peter M Martinique, MD;  Location[name] C[facility] LAB;  Service: Cardiovascular;  Laterality: N/A; . Inguinal hernia repair Right 09/13/2015   Procedure: OPEN RIGHT INGUINAL HERNIA;  Surgeon: Mickeal Skinner, MD;  Location: [facility];  Service: General;  Laterality: Right; . Insertion of mesh Right 09/13/2015   Procedure: INSERTION OF MESH;  Surgeon: Arta Bruce Kinsinger, MD;  Location: [facility];  Service: General;  Laterality: Right; HPI: Other Pertinent Information: Jshaun Abernathy is a 81 y.o. male with multiple medical problems not limited to CAD, ischemic cardiomyopathy, CKD, anemia of chronic disease and colon cancer, s/p resection, and pneumonia. He presented to the ED on 09/20/15 with complaints of constant non-radiating right sided chest pain. Also had hiccups since inhuinal repair on 10/3, chest pain worse with hiccup. CXR showed infiltration or atelectasis in the left lung base behind the heart. Currently treated for suspected aspiration pneumonia v. HCAP. Pt has a history of cervical esophageal web and dilation, latest dilation July 2015.  Pt is NPO. No Data Recorded Assessment / Plan / Recommendation CHL IP CLINICAL IMPRESSIONS 09/21/2015 Therapy Diagnosis Moderate cervical esophageal phase dysphagia;Mild pharyngeal phase dysphagia;Mild oral phase dysphagia Clinical Impression Pt demonstrated mild oral and oropharyngeal and moderate cervical esophageal dysphagia characterized by delayed swallow initation and esophageal transit. Due to incomplete epiglottic closure and backflow from cervical esophagus, pooling in the pyriform, pt penetrated/aspirated thin liquids and penetrated nectar. Pt sensed aspiration but did not consistently sense penetration. Penetration/Aspiration was not improved by chin tuck, but was improved by hard throat clear, expelled penetrate and phlegm. SLP provided moderate modeling and verbal cues to clear throat and swallow twice to clear valleculae and pyriform residue. The SLP cut the pill into four pieces, the pt did not clear the small portion of the pill  from the cervical esophagus with thin liquid, did clear with puree. Strongly recommend pills crushed in puree due to risk of stasis and backflow after pills, dysphagia 2 (fine chop) (due to decreased mastication of solids), and thin liquids with throat clear and second swallow. SLP  will f/u with check for diet tolerance and compensatory strategy training.    CHL IP TREATMENT RECOMMENDATION  09/21/2015 Treatment Recommendations Therapy as outlined in treatment plan below   CHL IP DIET RECOMMENDATION 09/21/2015 SLP Diet Recommendations Dysphagia 2 (Fine chop);Thin Liquid Administration via (None) Medication Administration Crushed with puree Compensations Small sips/bites;Slow rate;Multiple dry swallows after each bite/sip;Follow solids with liquid;Clear throat intermittently Postural Changes and/or Swallow Maneuvers (None)   CHL IP OTHER RECOMMENDATIONS 09/21/2015 Recommended Consults (None) Oral Care Recommendations Oral care QID Other Recommendations (None)   No flowsheet data found.  CHL IP FREQUENCY AND DURATION 09/21/2015 Speech Therapy Frequency (ACUTE ONLY) min 2x/week Treatment Duration 2 weeks   Pertinent Vitals/Pain NA  SLP Swallow Goals No flowsheet data found. No flowsheet data found.   CHL IP REASON FOR REFERRAL 09/21/2015 Reason for Referral Objectively evaluate swallowing function   CHL IP ORAL PHASE 09/21/2015 Lips (None) Tongue (None) Mucous membranes (None) Nutritional status (None) Other (None) Oxygen therapy (None) Oral Phase Impaired Oral - Pudding Teaspoon (None) Oral - Pudding Cup (None) Oral - Honey Teaspoon (None) Oral - Honey Cup (None) Oral - Honey Syringe (None) Oral - Nectar Teaspoon (None) Oral - Nectar Cup (None) Oral - Nectar Straw (None) Oral - Nectar Syringe (None) Oral - Ice Chips (None) Oral - Thin Teaspoon (None) Oral - Thin Cup (None) Oral - Thin Straw (None) Oral - Thin Syringe (None) Oral - Puree (None) Oral - Mechanical Soft (None) Oral - Regular (None) Oral - Multi-consistency (None) Oral - Pill (None) Oral Phase - Comment (None)   CHL IP PHARYNGEAL PHASE 09/21/2015 Pharyngeal Phase Impaired Pharyngeal - Pudding Teaspoon (None) Penetration/Aspiration details (pudding teaspoon) (None) Pharyngeal - Pudding Cup (None) Penetration/Aspiration details (pudding  cup) (None) Pharyngeal - Honey Teaspoon (None) Penetration/Aspiration details (honey teaspoon) (None) Pharyngeal - Honey Cup (None) Penetration/Aspiration details (honey cup) (None) Pharyngeal - Honey Syringe (None) Penetration/Aspiration details (honey syringe) (None) Pharyngeal - Nectar Teaspoon (None) Penetration/Aspiration details (nectar teaspoon) (None) Pharyngeal - Nectar Cup (None) Penetration/Aspiration details (nectar cup) (None) Pharyngeal - Nectar Straw (None) Penetration/Aspiration details (nectar straw) (None) Pharyngeal - Nectar Syringe (None) Penetration/Aspiration details (nectar syringe) (None) Pharyngeal - Ice Chips (None) Penetration/Aspiration details (ice chips) (None) Pharyngeal - Thin Teaspoon (None) Penetration/Aspiration details (thin teaspoon) (None) Pharyngeal - Thin Cup (None) Penetration/Aspiration details (thin cup) (None) Pharyngeal - Thin Straw (None) Penetration/Aspiration details (thin straw) (None) Pharyngeal - Thin Syringe (None) Penetration/Aspiration details (thin syringe') (None) Pharyngeal - Puree (None) Penetration/Aspiration details (puree) (None) Pharyngeal - Mechanical Soft (None) Penetration/Aspiration details (mechanical soft) (None) Pharyngeal - Regular (None) Penetration/Aspiration details (regular) (None) Pharyngeal - Multi-consistency (None) Penetration/Aspiration details (multi-consistency) (None) Pharyngeal - Pill (None) Penetration/Aspiration details (pill) (None) Pharyngeal Comment (None)   CHL IP CERVICAL ESOPHAGEAL PHASE 09/21/2015 Cervical Esophageal Phase Impaired Pudding Teaspoon (None) Pudding Cup (None) Honey Teaspoon (None) Honey Cup (None) Honey Straw (None) Nectar Teaspoon (None) Nectar Cup Esophageal backflow into the pharynx;Esophageal backflow into cervical esophagus Nectar Straw (None) Nectar Sippy Cup (None) Thin Teaspoon (None) Thin Cup Esophageal backflow into the pharynx;Esophageal backflow into cervical esophagus Thin Straw Esophageal backflow  into the pharynx;Esophageal backflow into cervical esophagus Thin Sippy Cup (None) Cervical Esophageal Comment (None) CHL IP GO 09/21/2015 Functional Assessment Tool Used clinical judgement Functional Limitations Swallowing Swallow Current Status (T0626) CJ Swallow Goal Status (R4854) CI Swallow Discharge Status (O2703) (None) Motor Speech Current Status (J0093) (None) Motor Speech Goal Status (G1829) (None) Motor Speech Goal Status (H3716) (None) Spoken Language Comprehension Current Status (R6789) (None) Spoken Language Comprehension Goal Status (F8101) (None) Spoken Language Comprehension Discharge Status [phone]) (None) Spoken Language Expression Current Status [phone]) (  None) Spoken Language Expression Goal Status [phone]) (None) Spoken Language Expression Discharge Status [phone]) (None) Attention Current Status [phone]) (None) Attention Goal Status (I5038) (None) Attention Discharge Status [phone]) (None) Memory Current Status (K3491) (None) Memory Goal Status (P9150) (None) Memory Discharge Status (V6979) (None) Voice Current Status (Y8016) (None) Voice Goal Status (P5374) (None) Voice Discharge Status [phone]) (None) Other Speech-Language Pathology Functional Limitation [phone]) (None) Other Speech-Language Pathology Functional Limitation Goal Status (G9201) (None) Other Speech-Language Pathology Functional Limitation Discharge Status [phone]) (None) Completed by Lanier Ensign, SLP Student Supervised and reviewed by Herbie Baltimore MA CCC-SLP   DeBlois, Katherene Ponto 09/21/2015, 3:12 PM    Microbiology: No results found for this or any previous visit (from the past 240 hour(s)).   Labs: Basic Metabolic Panel:  Recent Labs Lab 09/20/15 0546 09/20/15 1550 09/21/15 0401 09/22/15 0521 09/23/15 0429  NA 137  --  135 136 135  K 4.4  --  3.9 3.9 3.5  CL 97*  --  96* 98* 98*  CO2 29  --  29 25 28   GLUCOSE 296*  --  190* 127* 215*  BUN 55*  --  55* 51* 53*  CREATININE 2.67*  --  2.69* 2.87* 2.82*   CALCIUM 8.8*  --  8.5* 8.2* 8.4*  PHOS  --  2.7  --   --   --    Liver Function Tests:  Recent Labs Lab 09/21/15 0401  AST 26  ALT 19  ALKPHOS 65  BILITOT 1.6*  PROT 6.1*  ALBUMIN 2.4*   No results for input(s): LIPASE, AMYLASE in the last 168 hours. No results for input(s): AMMONIA in the last 168 hours. CBC:  Recent Labs Lab 09/20/15 0546 09/20/15 1550 09/21/15 0401 09/21/15 0843 09/22/15 0521 09/23/15 0429  WBC 8.6 10.5 8.8 10.0 8.3 8.0  NEUTROABS 6.8  --   --   --   --  6.3  HGB 7.4* 8.2* 9.8* 9.8* 9.4* 9.6*  HCT 23.3* 24.7* 29.5* 29.7* 28.6* 29.8*  MCV 101.3* 99.6 99.7 99.7 99.7 99.3  PLT 180 155 164 168 PLATELET CLUMPS NOTED ON SMEAR, COUNT APPEARS ADEQUATE 195   Cardiac Enzymes:  Recent Labs Lab 09/20/15 0546 09/20/15 1055 09/20/15 1318  TROPONINI 0.28* 0.14* 0.11*   BNP: BNP (last 3 results) No results for input(s): BNP in the last 8760 hours.  ProBNP (last 3 results) No results for input(s): PROBNP in the last 8760 hours.  CBG:  Recent Labs Lab 09/22/15 0749 09/22/15 1106 09/22/15 1644 09/22/15 2121 09/23/15 0737  GLUCAP 138* 202* 203* 280* 199*       Signed:  Nita Sells  [facility] 09/23/2015, 10:02 AM

## 2015-09-23 NOTE — Progress Notes (Signed)
Inpatient Diabetes Program Recommendations  AACE/ADA: New Consensus Statement on Inpatient Glycemic Control (2015)  Target Ranges:  Prepandial:   less than 140 mg/dL      Peak postprandial:   less than 180 mg/dL (1-2 hours)      Critically ill patients:  140 - 180 mg/dL   Results for [name] [name], [name] [name] (MRN [id number]) as of 09/23/2015 08:32  Ref. Range 09/22/2015 07:49 09/22/2015 11:06 09/22/2015 16:44 09/22/2015 21:21 09/23/2015 07:37  Glucose-Capillary Latest Ref Range: 65-99 mg/dL 138 (H) 202 (H) 203 (H) 280 (H) 199 (H)   Review of Glycemic Control  Diabetes history: DM 2 Outpatient Diabetes medications: None Current orders for Inpatient glycemic control: None  Inpatient Diabetes Program Recommendations: Correction (SSI): Was on Novolog Sensitive scale a couple of days ago. Glucose reaching in the 200's. Please consider adding Novolog Sensitive correction TID back to medications. May want to consider PO meds for discharge due to the increase in A1c to 7.7% on 09/20/2015 and follow up with his PCP.  Thanks,  Tama Headings RN, MSN, [facility] Inpatient Diabetes Coordinator Team Pager [phone] (8a-5p)

## 2015-09-23 NOTE — Clinical Social Work Placement (Signed)
   CLINICAL SOCIAL WORK PLACEMENT  NOTE  Date:  09/23/2015  Patient Details  Name: [name] [name] MRN: [id number] Date of Birth: 10-16-1926  Clinical Social Work is seeking post-discharge placement for this patient at the [place] level of care (*CSW will initial, date and re-position this form in  chart as items are completed):  Yes   Patient/family provided with [place] Work Department's list of facilities offering this level of care within the geographic area requested by the patient (or if unable, by the patient's family).  Yes   Patient/family informed of their freedom to choose among providers that offer the needed level of care, that participate in Medicare, Medicaid or managed care program needed by the patient, have an available bed and are willing to accept the patient.  Yes   Patient/family informed of [place]'s ownership interest in [facility] and [facility], as well as of the fact that they are under no obligation to receive care at these facilities.  PASRR submitted to EDS on       PASRR number received on       Existing PASRR number confirmed on 09/23/15     FL2 transmitted to all facilities in geographic area requested by pt/family on 09/23/15     FL2 transmitted to all facilities within larger geographic area on       Patient informed that his/her managed care company has contracts with or will negotiate with certain facilities, including the following:        Yes   Patient/family informed of bed offers received.  Patient chooses bed at [facility]     Physician recommends and patient chooses bed at      Patient to be transferred to [facility] on 09/24/15.  Patient to be transferred to facility by [facility]AR     Patient family notified on 09/15/15 of transfer.  Name of family member notified:        PHYSICIAN Please sign DNR     Additional Comment:    Barbette Or,  [place]

## 2015-09-23 NOTE — Clinical Social Work Note (Signed)
Clinical Social Work Assessment  Patient Details  Name: [name] [name] MRN: [id number] Date of Birth: 1926-07-03  Date of referral:  09/23/15               Reason for consult:  Facility Placement                Permission sought to share information with:  Family Supports Permission granted to share information::  Yes, Verbal Permission Granted  Name::     [name] [name]  Relationship::  Daughter  Contact Information:  [phone]  Housing/Transportation Living arrangements for the past 2 months:  [place] of Information:  Patient, Adult Children Patient Interpreter Needed:  None Criminal Activity/Legal Involvement Pertinent to Current Situation/Hospitalization:  No - Comment as needed Significant Relationships:  Adult Children, Spouse Lives with:  Spouse Do you feel safe going back to the place where you live?  Yes Need for family participation in patient care:  Yes (Comment)  Care giving concerns:  Patient daughter and wife state that patient is just terribly weak and they are no longer able to care for him at home.  Patient daughter states that patient has been to [facility] in the past and refuses to return.   Social Worker assessment / plan:  Holiday representative met with patient and patient daughter at bedside to offer support and discuss patient needs at discharge.  Patient agreeable to SNF placement in [facility] and patient daughter provided additional information about patient not returning to [facility].  CSW initiated referral and followed up with patient and family to discuss bed offers.  Patient and family have chosen [facility] and are prepared for discharge on 10/14.  Patient will go by ambulance due to oxygen requirement.  CSW updated MD and RN.  CSW remains available for support and to facilitate patient discharge needs.  Employment status:  Retired Forensic scientist:  Medicare PT Recommendations:  [place] / Referral to community resources:  [name]  Patient/Family's Response to care:  Patient and family verbalize appreciation for CSW support and involvement.  Patient and patient family understanding of CSW role and facilitating discharge needs.  Patient/Family's Understanding of and Emotional Response to Diagnosis, Current Treatment, and Prognosis:  Patient family realistic regarding patient needs currently and the potential for long term needs.  Patient with good supportive family who are coping well with patient transition to SNF.  Emotional Assessment Appearance:  Appears stated age Attitude/Demeanor/Rapport:   (Cooperative, Engaged, Appropriate) Affect (typically observed):  Accepting, Appropriate, Calm Orientation:  Oriented to Self, Oriented to Place, Oriented to  Time, Oriented to Situation Alcohol / Substance use:  Not Applicable Psych involvement (Current and /or in the community):  No (Comment)  Discharge Needs  Concerns to be addressed:  Discharge Planning Concerns Readmission within the last 30 days:  No Current discharge risk:  Physical Impairment Barriers to Discharge:  Continued Medical Work up, [employer], [place]

## 2015-09-24 DIAGNOSIS — I130 Hypertensive heart and chronic kidney disease with heart failure and stage 1 through stage 4 chronic kidney disease, or unspecified chronic kidney disease: Secondary | ICD-10-CM | POA: Diagnosis not present

## 2015-09-24 DIAGNOSIS — I2510 Atherosclerotic heart disease of native coronary artery without angina pectoris: Secondary | ICD-10-CM | POA: Diagnosis not present

## 2015-09-24 DIAGNOSIS — E118 Type 2 diabetes mellitus with unspecified complications: Secondary | ICD-10-CM | POA: Diagnosis not present

## 2015-09-24 DIAGNOSIS — I252 Old myocardial infarction: Secondary | ICD-10-CM | POA: Diagnosis not present

## 2015-09-24 DIAGNOSIS — I25110 Atherosclerotic heart disease of native coronary artery with unstable angina pectoris: Secondary | ICD-10-CM | POA: Diagnosis not present

## 2015-09-24 DIAGNOSIS — R7303 Prediabetes: Secondary | ICD-10-CM | POA: Diagnosis not present

## 2015-09-24 DIAGNOSIS — I5022 Chronic systolic (congestive) heart failure: Secondary | ICD-10-CM | POA: Diagnosis not present

## 2015-09-24 DIAGNOSIS — I255 Ischemic cardiomyopathy: Secondary | ICD-10-CM | POA: Diagnosis not present

## 2015-09-24 DIAGNOSIS — D631 Anemia in chronic kidney disease: Secondary | ICD-10-CM | POA: Diagnosis not present

## 2015-09-24 DIAGNOSIS — R079 Chest pain, unspecified: Secondary | ICD-10-CM | POA: Diagnosis not present

## 2015-09-24 DIAGNOSIS — J189 Pneumonia, unspecified organism: Secondary | ICD-10-CM | POA: Diagnosis not present

## 2015-09-24 DIAGNOSIS — Z8673 Personal history of transient ischemic attack (TIA), and cerebral infarction without residual deficits: Secondary | ICD-10-CM | POA: Diagnosis not present

## 2015-09-24 DIAGNOSIS — Z7982 Long term (current) use of aspirin: Secondary | ICD-10-CM | POA: Diagnosis not present

## 2015-09-24 DIAGNOSIS — H44513 Absolute glaucoma, bilateral: Secondary | ICD-10-CM | POA: Diagnosis not present

## 2015-09-24 DIAGNOSIS — I480 Paroxysmal atrial fibrillation: Secondary | ICD-10-CM | POA: Diagnosis not present

## 2015-09-24 DIAGNOSIS — N183 Chronic kidney disease, stage 3 (moderate): Secondary | ICD-10-CM | POA: Diagnosis not present

## 2015-09-24 DIAGNOSIS — K219 Gastro-esophageal reflux disease without esophagitis: Secondary | ICD-10-CM | POA: Diagnosis not present

## 2015-09-24 DIAGNOSIS — Z85038 Personal history of other malignant neoplasm of large intestine: Secondary | ICD-10-CM | POA: Diagnosis not present

## 2015-09-24 DIAGNOSIS — K4090 Unilateral inguinal hernia, without obstruction or gangrene, not specified as recurrent: Secondary | ICD-10-CM | POA: Diagnosis not present

## 2015-09-24 DIAGNOSIS — N189 Chronic kidney disease, unspecified: Secondary | ICD-10-CM | POA: Diagnosis not present

## 2015-09-24 DIAGNOSIS — Z9889 Other specified postprocedural states: Secondary | ICD-10-CM | POA: Diagnosis not present

## 2015-09-24 DIAGNOSIS — Z953 Presence of xenogenic heart valve: Secondary | ICD-10-CM | POA: Diagnosis not present

## 2015-09-24 DIAGNOSIS — E1169 Type 2 diabetes mellitus with other specified complication: Secondary | ICD-10-CM | POA: Diagnosis not present

## 2015-09-24 DIAGNOSIS — Z954 Presence of other heart-valve replacement: Secondary | ICD-10-CM | POA: Diagnosis not present

## 2015-09-24 DIAGNOSIS — J188 Other pneumonia, unspecified organism: Secondary | ICD-10-CM | POA: Diagnosis not present

## 2015-09-24 DIAGNOSIS — I5042 Chronic combined systolic (congestive) and diastolic (congestive) heart failure: Secondary | ICD-10-CM | POA: Diagnosis not present

## 2015-09-24 DIAGNOSIS — N179 Acute kidney failure, unspecified: Secondary | ICD-10-CM | POA: Diagnosis not present

## 2015-09-24 DIAGNOSIS — Z951 Presence of aortocoronary bypass graft: Secondary | ICD-10-CM | POA: Diagnosis not present

## 2015-09-24 DIAGNOSIS — E785 Hyperlipidemia, unspecified: Secondary | ICD-10-CM | POA: Diagnosis not present

## 2015-09-24 DIAGNOSIS — I5041 Acute combined systolic (congestive) and diastolic (congestive) heart failure: Secondary | ICD-10-CM | POA: Diagnosis not present

## 2015-09-24 DIAGNOSIS — I70229 Atherosclerosis of native arteries of extremities with rest pain, unspecified extremity: Secondary | ICD-10-CM | POA: Diagnosis not present

## 2015-09-24 DIAGNOSIS — I10 Essential (primary) hypertension: Secondary | ICD-10-CM | POA: Diagnosis not present

## 2015-09-24 DIAGNOSIS — J690 Pneumonitis due to inhalation of food and vomit: Secondary | ICD-10-CM | POA: Diagnosis not present

## 2015-09-24 DIAGNOSIS — Z7984 Long term (current) use of oral hypoglycemic drugs: Secondary | ICD-10-CM | POA: Diagnosis not present

## 2015-09-24 DIAGNOSIS — Z8719 Personal history of other diseases of the digestive system: Secondary | ICD-10-CM | POA: Diagnosis not present

## 2015-09-24 DIAGNOSIS — Z79899 Other long term (current) drug therapy: Secondary | ICD-10-CM | POA: Diagnosis not present

## 2015-09-24 DIAGNOSIS — R0789 Other chest pain: Secondary | ICD-10-CM | POA: Diagnosis not present

## 2015-09-24 DIAGNOSIS — I209 Angina pectoris, unspecified: Secondary | ICD-10-CM | POA: Diagnosis not present

## 2015-09-24 DIAGNOSIS — Z8249 Family history of ischemic heart disease and other diseases of the circulatory system: Secondary | ICD-10-CM | POA: Diagnosis not present

## 2015-09-24 LAB — GLUCOSE, CAPILLARY: Glucose-Capillary: 137 mg/dL — ABNORMAL HIGH (ref 65–99)

## 2015-09-24 MED ORDER — INSULIN DETEMIR 100 UNIT/ML ~~LOC~~ SOLN: 5.0000 [IU] | Freq: Every day | SUBCUTANEOUS | Status: DC

## 2015-09-24 NOTE — Clinical Social Work Placement (Signed)
   CLINICAL SOCIAL WORK PLACEMENT  NOTE  Date:  09/24/2015  Patient Details  Name: [name] [name] MRN: [id number] Date of Birth: 01-21-1926  Clinical Social Work is seeking post-discharge placement for this patient at the [place] level of care (*CSW will initial, date and re-position this form in  chart as items are completed):  Yes   Patient/family provided with [place] Work Department's list of facilities offering this level of care within the geographic area requested by the patient (or if unable, by the patient's family).  Yes   Patient/family informed of their freedom to choose among providers that offer the needed level of care, that participate in Medicare, Medicaid or managed care program needed by the patient, have an available bed and are willing to accept the patient.  Yes   Patient/family informed of [place]'s ownership interest in [facility] and [facility], as well as of the fact that they are under no obligation to receive care at these facilities.  PASRR submitted to EDS on       PASRR number received on       Existing PASRR number confirmed on 09/23/15     FL2 transmitted to all facilities in geographic area requested by pt/family on 09/23/15     FL2 transmitted to all facilities within larger geographic area on       Patient informed that his/her managed care company has contracts with or will negotiate with certain facilities, including the following:        Yes   Patient/family informed of bed offers received.  Patient [facility] bed at [facility]     Physician recommends and patient chooses bed at      Patient to be transferred to [facility] on 09/24/15.  Patient to be transferred to facility by PTAR     Patient family notified on 09/15/15 of transfer.  Name of family member notified:        PHYSICIAN Please sign DNR     Additional Comment:  Per MD patient  ready for DC to [facility]. RN, patient, patient's family, and facility notified of DC. RN given number for report. DC packet on chart. Ambulance transport requested for patient for 11AM. CSW signing off.   _______________________________________________ Liz Beach MSW, [place], [place], [phone]

## 2015-09-24 NOTE — Care Management Note (Signed)
Case Management Note  Patient Details  Name: Darvell Monteforte MRN: [id number] Date of Birth: Apr 15, 1926  Subjective/Objective: Pt admitted with cp and cough.                    Action/Plan: CSW assisting with disposition needs. No needs identified by CM.    Expected Discharge Date:                  Expected Discharge Plan:  Skilled Nursing Facility  In-House Referral:  Clinical Social Work  Discharge planning Services  CM Consult  Post Acute Care Choice:  NA Choice offered to:  NA  DME Arranged:  N/A DME Agency:  NA  HH Arranged:  NA HH Agency:  NA  Status of Service:  Completed, signed off  Medicare Important Message Given:  Yes-second notification given Date Medicare IM Given:    Medicare IM give by:    Date Additional Medicare IM Given:    Additional Medicare Important Message give by:     If discussed at [place] of Stay Meetings, dates discussed:    Additional Comments:  Bethena Roys, RN 09/24/2015, 11:55 AM

## 2015-09-28 ENCOUNTER — Non-Acute Institutional Stay (SKILLED_NURSING_FACILITY): Payer: Medicare Other | Admitting: Adult Health

## 2015-09-28 ENCOUNTER — Encounter: Payer: Self-pay | Admitting: Adult Health

## 2015-09-28 DIAGNOSIS — I25110 Atherosclerotic heart disease of native coronary artery with unstable angina pectoris: Secondary | ICD-10-CM | POA: Diagnosis not present

## 2015-09-28 DIAGNOSIS — J690 Pneumonitis due to inhalation of food and vomit: Secondary | ICD-10-CM

## 2015-09-28 DIAGNOSIS — K4090 Unilateral inguinal hernia, without obstruction or gangrene, not specified as recurrent: Secondary | ICD-10-CM | POA: Diagnosis not present

## 2015-09-28 DIAGNOSIS — N183 Chronic kidney disease, stage 3 unspecified: Secondary | ICD-10-CM

## 2015-09-28 DIAGNOSIS — E118 Type 2 diabetes mellitus with unspecified complications: Secondary | ICD-10-CM

## 2015-09-28 DIAGNOSIS — I10 Essential (primary) hypertension: Secondary | ICD-10-CM

## 2015-09-28 DIAGNOSIS — I5042 Chronic combined systolic (congestive) and diastolic (congestive) heart failure: Secondary | ICD-10-CM

## 2015-09-28 NOTE — Progress Notes (Signed)
Patient ID: [name] [name], male   DOB: September 12, 1926, 81 y.o.   MRN: [id number]    Facility: [facility]      Allergies  Allergen Reactions  . Statins Other (See Comments)    Pain/weakness in legs    Chief Complaint  Patient presents with  . Hospitalization Follow-up    HPI:  He has been hospitalized for pneumonia; acute on chronic renal failure; chf; anemia. He is here for short term rehab. He had had an inguinal hernia repair done in the beginning of this month and has struggled since that time. He had a fall at home due to confusion from pain medication. In September of this year he was independent and driving himself. At this time his goal is to return back home. He is mildly disoriented at this time. He states that he has scrotal pain.     Past Medical History  Diagnosis Date  . History of colon cancer     s/p colon resection  . Aortic stenosis     a. s/p AVR with 66mm Edwards pericardial valve 02/07/12 - post-op course complicated by pleural effusion requring thoracentesis/leg cellulitis 03/2012.  Marland Kitchen CAD (coronary artery disease)     a. s/p NSTEMI 12/2011:  LHC - Ostial left main 20%, ostial LAD 50%, mid 60-70%, ostial D1 40% and mid 40%, D2 70%, ostial circumflex occluded, ostial RCA 80-90%, LVEDP was 42. b.  s/p CABG x 3 at time of AVR (LiMA-LAD, SVG-2nd daigonal, SVG-PDA) 02/07/12 (post-op course noted above).  . Ischemic cardiomyopathy   . Chronic systolic heart failure ([place])     a. TEE 01/2012: EF 25-30%, diffuse hypokinesis. b. Not on ACEI due to renal insufficiency.;  c. follow up  echo 08/06/12: EF 25%, mod diast dysfxn, AVR ok, mild MR, mod LAE, mild RAE, mild to mod RV systolic dysfunction  . HLD (hyperlipidemia)   . GERD (gastroesophageal reflux disease)   . Chronic kidney disease   . Pleural effusion     a. post-operatively after AVR/CABG s/p thoracentesis 03/2012 yielding 1L serosanguinous fluid.  . Diabetes mellitus     borderline  . Blood transfusion      NO REACTION TO TRANSFUSION  . Cellulitis     a. RLE cellulitis 2 months post-operatively after AVR/CABG - serratia marcessans, tx with I&D/antibiotics  . Mitral regurgitation     Moderate by TEE 01/2012  . Flash pulmonary edema ([place])     Post-cath 12/2011, went into acute pulm edema requiring IV lasix and intubation  . Baker's cyst 07/23/12    Incidental finding of LE venous dopplers  . Cataract     right eye, hx of  . HTN (hypertension)     primary, Dr. Hollace Kinnier  . Myocardial infarction ([place]) 12/2011  . CHF (congestive heart failure) ([place])   . Stroke ([place]) 10/01    RIght Leg weakness  . Cancer [facility]) '90's    Colon  . Anemia     Past Surgical History  Procedure Laterality Date  . Colon resection  1996  . Esophageal dilation    . Colonoscopy    . Cataract extraction      rt  . Cardiac catheterization      1.3.13  stopped breathing, put on ventilator for 5-6 days  . Tonsillectomy    . Aortic valve replacement  02/07/2012    Procedure: AORTIC VALVE REPLACEMENT (AVR);  Surgeon: Gaye Pollack, MD;  Location: [place];  Service: Open Heart Surgery;  Laterality:  N/A;  . Coronary artery bypass graft  02/07/2012    Procedure: CORONARY ARTERY BYPASS GRAFTING (CABG);  Surgeon: Gaye Pollack, MD;  Location: [place];  Service: Open Heart Surgery;  Laterality: N/A;  CABG x three; using right leg greater saphenous vein harvested endoscopically  . Chest tube insertion  07/24/2012    Procedure: INSERTION PLEURAL DRAINAGE CATHETER;  Surgeon: Gaye Pollack, MD;  Location: [place];  Service: Thoracic;  Laterality: Left;  . Talc pleurodesis  08/30/2012    Procedure: TALC PLEURADESIS;  Surgeon: Gaye Pollack, MD;  Location: [place];  Service: Thoracic;  Laterality: Left;  INSERTION OF TALC VIA LEFT PLEURX  . Talc pleurodesis  09/12/2012    Procedure: TALC PLEURADESIS;  Surgeon: Gaye Pollack, MD;  Location: [place];  Service: Thoracic;  Laterality: Left;  . Removal of pleural drainage catheter   09/27/2012    Procedure: REMOVAL OF PLEURAL DRAINAGE CATHETER;  Surgeon: Gaye Pollack, MD;  Location: [place];  Service: Thoracic;  Laterality: Left;  Marland Kitchen Eye surgery  2009    cataract removed from right eye  . Esophagogastroduodenoscopy N/A 07/03/2014    Procedure: ESOPHAGOGASTRODUODENOSCOPY (EGD);  Surgeon: Arta Silence, MD;  Location: [facility] ENDOSCOPY;  Service: Endoscopy;  Laterality: N/A;  . Esophagoscopy with dilitation N/A 07/07/2014    Procedure: ESOPHAGOSCOPY WITH DILITATION/SAVARY DILATOR;  Surgeon: Rozetta Nunnery, MD;  Location: [place];  Service: ENT;  Laterality: N/A;  . Ercp N/A 07/08/2014    Procedure: ENDOSCOPIC RETROGRADE CHOLANGIOPANCREATOGRAPHY (ERCP);  Surgeon: Missy Sabins, MD;  Location: [facility] ENDOSCOPY;  Service: Endoscopy;  Laterality: N/A;  . Left heart catheterization with coronary angiogram N/A 12/13/2011    Procedure: LEFT HEART CATHETERIZATION WITH CORONARY ANGIOGRAM;  Surgeon: Peter M Martinique, MD;  Location: [facility] CATH LAB;  Service: Cardiovascular;  Laterality: N/A;  . Inguinal hernia repair Right 09/13/2015    Procedure: OPEN RIGHT INGUINAL HERNIA;  Surgeon: Mickeal Skinner, MD;  Location: [place];  Service: General;  Laterality: Right;  . Insertion of mesh Right 09/13/2015    Procedure: INSERTION OF MESH;  Surgeon: Arta Bruce Kinsinger, MD;  Location: [place];  Service: General;  Laterality: Right;    VITAL SIGNS BP 104/48 mmHg  Pulse 58  Ht 5\' 8"  (1.727 m)  Wt 155 lb (70.308 kg)  BMI 23.57 kg/m2  SpO2 98%  Patient's Medications  New Prescriptions   No medications on file  Previous Medications   ALPRAZOLAM (XANAX) 0.25 MG TABLET    Take 1 tablet (0.25 mg total) by mouth 2 (two) times daily as needed for anxiety.   ASPIRIN EC 325 MG TABLET    Take 325 mg by mouth daily.    CARVEDILOL (COREG) 25 MG TABLET    TAKE 1 TABLET BY MOUTH TWICE A DAY WITH A MEAL   CHLORPROMAZINE (THORAZINE) 10 MG TABLET    Take 1 tablet (10 mg total) by mouth 3 (three) times daily.    CHOLECALCIFEROL (VITAMIN D) 1000 UNITS TABLET    Take 1,000 Units by mouth daily with supper.    FUROSEMIDE (LASIX) 40 MG TABLET    Take 2 tablets (80 mg total) by mouth daily.   GLIMEPIRIDE (AMARYL) 1 MG TABLET    Take 1 tablet (1 mg total) by mouth daily with breakfast.   GUAIFENESIN-DEXTROMETHORPHAN (ROBITUSSIN DM) 100-10 MG/5ML SYRUP    Take 10 mLs by mouth every 6 (six) hours.   HYDRALAZINE (APRESOLINE) 25 MG TABLET    TAKE 1 TABLET  BY MOUTH 3 TIMES A DAY   ISOSORBIDE MONONITRATE (IMDUR) 60 MG 24 HR TABLET    TAKE 1 TABLET BY MOUTH EVERY DAY   MULTIPLE VITAMIN (MULTIVITAMIN WITH MINERALS) TABS TABLET    Take 1 tablet by mouth daily.   NITROGLYCERIN (NITROSTAT) 0.4 MG SL TABLET    Place 0.4 mg under the tongue every 5 (five) minutes as needed for chest pain.   OMEPRAZOLE (PRILOSEC) 20 MG CAPSULE    Take 20 mg by mouth daily.    OXYCODONE (OXY IR/ROXICODONE) 5 MG IMMEDIATE RELEASE TABLET    Take 1-2 tablets (5-10 mg total) by mouth every 4 (four) hours as needed for moderate pain.   POLYETHYLENE GLYCOL (MIRALAX / GLYCOLAX) PACKET    Take 17 g by mouth daily as needed for mild constipation.   TAMSULOSIN (FLOMAX) 0.4 MG CAPS CAPSULE    TAKE 1 CAPSULE BY MOUTH ONCE DAILY AFTER SUPPER   TRAVOPROST, BAK FREE, (TRAVATAN) 0.004 % SOLN OPHTHALMIC SOLUTION    Place 1 drop into both eyes daily.   Modified Medications   No medications on file  Discontinued Medications     SIGNIFICANT DIAGNOSTIC EXAMS  09-20-15: chest x-ray: Infiltration or atelectasis in the left lung base behind the heart.  09-21-15: swallow study: Dysphagia 2 (Fine chop);Thin liquids   09-21-15: 2-d echo: - Left ventricle: The cavity size was normal. Systolic function was mildly to moderately reduced. The estimated ejection fraction was in the range of 40% to 45%. There is akinesis of the inferolateral, inferior, and inferoseptal myocardium. Features are consistent with a pseudonormal left ventricular filling pattern, with  concomitant abnormal relaxation and increased filling pressure (grade 2 diastolic dysfunction). - Aortic valve: A bioprosthesis was present.  - Mitral valve: There was moderate regurgitation. - Left atrium: The atrium was mildly dilated. - Right ventricle: The cavity size was mildly dilated. Wall thickness was normal. Systolic function was moderately reduced. - Pulmonic valve: There was moderate regurgitation. - Pulmonary arteries: Systolic pressure was moderately increased.      LABS REVIEWED:   09-20-15: wbc 8.6; hgb 7.4; hct 23.3; mcv 101.3; plt 180; glucose 296; bun 55; creat 2.67; k+ 4.4; na++137; phos 2.7; pth 48; vit b12: 554; folate >80.0; iron 19; tibc 185; hgb a1c 7.7; stool guaiac: neg 09-21-15: wbc 8.8; hgb 9.8; hct 29.5; mcv 88.7; plt 164; glucose 190; bun 55; creat 2.69; k+ 3.9; na++135; liver normal albumin 2.4 09-23-15: wbc 8.0; hgb 9.6; hct 29.8; mcv 99.3; plt 195; glucose 215; bun 53; creat 2.82; k+ 3.5; na++135     Review of Systems  Constitutional: Negative for appetite change and fatigue.  HENT: Negative for congestion.   Respiratory: Negative for cough, chest tightness and shortness of breath.   Cardiovascular: Negative for chest pain, palpitations and leg swelling.  Gastrointestinal: Negative for nausea, abdominal pain, diarrhea and constipation.  Genitourinary:       Has pain around his scrotum and groin area  Musculoskeletal: Negative for myalgias and arthralgias.  Skin: Negative for pallor.  Neurological: Negative for dizziness.  Psychiatric/Behavioral: The patient is not nervous/anxious.       Physical Exam  Constitutional: No distress.  Frail   Eyes: Conjunctivae are normal.  Neck: Neck supple. No JVD present. No thyromegaly present.  Cardiovascular: Regular rhythm and intact distal pulses.   Heart rate is slightly bradycardic   Respiratory: Effort normal and breath sounds normal. No respiratory distress. He has no wheezes.  GI: Soft. Bowel sounds  are normal. He  exhibits no distension. There is no tenderness.  Genitourinary:  Scrotum is slightly swollen His skin on his scrotum and inner thigh is red and inflamed reflective of yeast on skin   Musculoskeletal: He exhibits no edema.  Able to move all extremities   Lymphadenopathy:    He has no cervical adenopathy.  Neurological: He is alert.  Skin: Skin is warm and dry. He is not diaphoretic.  Psychiatric: He has a normal mood and affect.       ASSESSMENT/ PLAN:  1. Pneumonia: he has complete his augmentin. He is on robitussin dm 10 cc every 6 hours. Is on 02 which he was not prior to his hospitalization. Will try to wean him off the 02.   2. chronic systolic and diastolic heart failure: will continue his lasix 80 mg daily; will continue daily weights; hydralazine 25 mg three times daily coreg 25 mg twice daily will monitor  Ef is 40-45%  3. CAD: is status post cabg; will continue asa 325 mg daily; imdur 60 mg daily; ntg prn; will monitor  4. Hiccups: will continue thorazine 10 mg three times daily; will reduce him to twice daily and will monitor his status.   5. Diabetes: will continue amaryl 1 mg daily his hgb a1c is 7.7  5. Jerrye Bushy: will continue prilosec 20 mg daily   6. BPH: will continue flomax 0.4 mg daily   7. Anemia is chronic kidney disease: hgb is 9.6  Will monitor  8. CKD stage III: is without change creat is 2.82  9. Hypertension: will continue coreg 25 mg twice daily hydralazine 25 mg three times daily   10. Anxiety will continue xanax 0.25 mg twice daily as needed   11. Status post right inguinal hernia repair: will continue oxycodone 5 or 10 mg every 4 hours as needed for pain will monitor   Will use antifungal cream to his scrotal area per facility protocol and will have staff elevate his scrotum while in bed.    Time spent with patient  50  minutes >50% time spent counseling; reviewing medical record; tests; labs; and developing future plan of  care     Ok Edwards NP [facility]  Contact [phone] Monday through Friday 8am- 5pm  After hours call [phone]

## 2015-09-30 ENCOUNTER — Non-Acute Institutional Stay (SKILLED_NURSING_FACILITY): Payer: Medicare Other | Admitting: Internal Medicine

## 2015-09-30 ENCOUNTER — Encounter: Payer: Self-pay | Admitting: Internal Medicine

## 2015-09-30 DIAGNOSIS — I10 Essential (primary) hypertension: Secondary | ICD-10-CM

## 2015-09-30 DIAGNOSIS — J189 Pneumonia, unspecified organism: Secondary | ICD-10-CM | POA: Diagnosis not present

## 2015-09-30 DIAGNOSIS — I25110 Atherosclerotic heart disease of native coronary artery with unstable angina pectoris: Secondary | ICD-10-CM | POA: Diagnosis not present

## 2015-09-30 DIAGNOSIS — I5042 Chronic combined systolic (congestive) and diastolic (congestive) heart failure: Secondary | ICD-10-CM

## 2015-09-30 DIAGNOSIS — I480 Paroxysmal atrial fibrillation: Secondary | ICD-10-CM

## 2015-09-30 DIAGNOSIS — Z9889 Other specified postprocedural states: Secondary | ICD-10-CM | POA: Diagnosis not present

## 2015-09-30 DIAGNOSIS — N183 Chronic kidney disease, stage 3 unspecified: Secondary | ICD-10-CM

## 2015-09-30 DIAGNOSIS — E118 Type 2 diabetes mellitus with unspecified complications: Secondary | ICD-10-CM | POA: Diagnosis not present

## 2015-09-30 DIAGNOSIS — Z8719 Personal history of other diseases of the digestive system: Secondary | ICD-10-CM

## 2015-09-30 NOTE — Progress Notes (Signed)
Patient ID: [name] [name], male   DOB: [birth year], 81 y.o.   MRN: [id number]    HISTORY AND PHYSICAL   DATE: 09/30/15  Location:  [facility] of Service: SNF [phone])   Extended Emergency Contact Information Primary Emergency Contact: [name] [name] Address: [place], [place] [address] [place] Phone: [phone] Relation: Spouse Secondary Emergency Contact: [name] [name] Address: [address]          [place], [place] [address] [place] Phone: [phone] Relation: Son  Advanced Directive information  DNR  Chief Complaint  Patient presents with  . New Admit To SNF    HPI:  81 yo male seen today as a new admission into SNF following hospital stay for left sided HCAP, recent right inguinal hernia repair, atrial fibrillation, A/CKD, chronic HF. CXR revealed left sided opacity. He was d/c'd on Augmentin through 10/13/15.  He has no concerns today. No nursing issues. No falls. Appetite ok. CBGs 140-170. No low BS reactions. He is tolerating PT  chronic systolic and diastolic heart failure - stable on lasix 80 mg daily; hydralazine 25 mg three times daily; coreg 25 mg twice daily. 2 D echo reveals EF 40-45%  CAD - s/p CABG. Takes ASA 325 mg daily; imdur 60 mg daily; NTG prn  Hiccups - stable on thorazine BID  DM - CBG 162 today. Take amaryl 1 mg daily. A1c 7.7%  GERD - stable on prilosec 20 mg daily   BPH - stable on flomax 0.4 mg daily   Anemia of chronic kidney disease - stable with Hgb 9.6  CKD stage III - stable with Cr 2.82  Hypertension - BP stable on coreg 25 mg twice daily; hydralazine 25 mg three times daily   Anxiety - stable on xanax 0.25 mg twice daily as needed   Status post right inguinal hernia repair - pain stable on oxycodone 5 or 10 mg every 4 hours as needed for pain  Past Medical History  Diagnosis Date  . History of colon cancer     s/p colon  resection  . Aortic stenosis     a. s/p AVR with 11m Edwards pericardial valve 02/07/12 - post-op course complicated by pleural effusion requring thoracentesis/leg cellulitis 03/2012.  .Marland KitchenCAD (coronary artery disease)     a. s/p NSTEMI 12/2011:  LHC - Ostial left main 20%, ostial LAD 50%, mid 60-70%, ostial D1 40% and mid 40%, D2 70%, ostial circumflex occluded, ostial RCA 80-90%, LVEDP was 42. b.  s/p CABG x 3 at time of AVR (LiMA-LAD, SVG-2nd daigonal, SVG-PDA) 02/07/12 (post-op course noted above).  . Ischemic cardiomyopathy   . Chronic systolic heart failure ([place]     a. TEE 01/2012: EF 25-30%, diffuse hypokinesis. b. Not on ACEI due to renal insufficiency.;  c. follow up  echo 08/06/12: EF 25%, mod diast dysfxn, AVR ok, mild MR, mod LAE, mild RAE, mild to mod RV systolic dysfunction  . HLD (hyperlipidemia)   . GERD (gastroesophageal reflux disease)   . Chronic kidney disease   . Pleural effusion     a. post-operatively after AVR/CABG s/p thoracentesis 03/2012 yielding 1L serosanguinous fluid.  . Diabetes mellitus     borderline  . Blood transfusion     NO REACTION TO TRANSFUSION  . Cellulitis     a. RLE cellulitis 2 months post-operatively after AVR/CABG - serratia marcessans, tx  with I&D/antibiotics  . Mitral regurgitation     Moderate by TEE 01/2012  . Flash pulmonary edema ([place])     Post-cath 12/2011, went into acute pulm edema requiring IV lasix and intubation  . Baker's cyst 07/23/12    Incidental finding of LE venous dopplers  . Cataract     right eye, hx of  . HTN (hypertension)     primary, Dr. Hollace Kinnier  . Myocardial infarction ([place]) 12/2011  . CHF (congestive heart failure) ([place])   . Stroke ([place]) 10/01    RIght Leg weakness  . Cancer [facility]) '90's    Colon  . Anemia     Past Surgical History  Procedure Laterality Date  . Colon resection  1996  . Esophageal dilation    . Colonoscopy    . Cataract extraction      rt  . Cardiac catheterization      1.3.13  stopped  breathing, put on ventilator for 5-6 days  . Tonsillectomy    . Aortic valve replacement  02/07/2012    Procedure: AORTIC VALVE REPLACEMENT (AVR);  Surgeon: Gaye Pollack, MD;  Location: [place];  Service: Open Heart Surgery;  Laterality: N/A;  . Coronary artery bypass graft  02/07/2012    Procedure: CORONARY ARTERY BYPASS GRAFTING (CABG);  Surgeon: Gaye Pollack, MD;  Location: [place];  Service: Open Heart Surgery;  Laterality: N/A;  CABG x three; using right leg greater saphenous vein harvested endoscopically  . Chest tube insertion  07/24/2012    Procedure: INSERTION PLEURAL DRAINAGE CATHETER;  Surgeon: Gaye Pollack, MD;  Location: [place];  Service: Thoracic;  Laterality: Left;  . Talc pleurodesis  08/30/2012    Procedure: TALC PLEURADESIS;  Surgeon: Gaye Pollack, MD;  Location: [place];  Service: Thoracic;  Laterality: Left;  INSERTION OF TALC VIA LEFT PLEURX  . Talc pleurodesis  09/12/2012    Procedure: TALC PLEURADESIS;  Surgeon: Gaye Pollack, MD;  Location: [place];  Service: Thoracic;  Laterality: Left;  . Removal of pleural drainage catheter  09/27/2012    Procedure: REMOVAL OF PLEURAL DRAINAGE CATHETER;  Surgeon: Gaye Pollack, MD;  Location: [place];  Service: Thoracic;  Laterality: Left;  Marland Kitchen Eye surgery  2009    cataract removed from right eye  . Esophagogastroduodenoscopy N/A 07/03/2014    Procedure: ESOPHAGOGASTRODUODENOSCOPY (EGD);  Surgeon: Arta Silence, MD;  Location: [facility] ENDOSCOPY;  Service: Endoscopy;  Laterality: N/A;  . Esophagoscopy with dilitation N/A 07/07/2014    Procedure: ESOPHAGOSCOPY WITH DILITATION/SAVARY DILATOR;  Surgeon: Rozetta Nunnery, MD;  Location: [place];  Service: ENT;  Laterality: N/A;  . Ercp N/A 07/08/2014    Procedure: ENDOSCOPIC RETROGRADE CHOLANGIOPANCREATOGRAPHY (ERCP);  Surgeon: Missy Sabins, MD;  Location: [facility] ENDOSCOPY;  Service: Endoscopy;  Laterality: N/A;  . Left heart catheterization with coronary angiogram N/A 12/13/2011    Procedure: LEFT HEART  CATHETERIZATION WITH CORONARY ANGIOGRAM;  Surgeon: [place] M Martinique, MD;  Location: [facility] CATH LAB;  Service: Cardiovascular;  Laterality: N/A;  . Inguinal hernia repair Right 09/13/2015    Procedure: OPEN RIGHT INGUINAL HERNIA;  Surgeon: Mickeal Skinner, MD;  Location: [place];  Service: General;  Laterality: Right;  . Insertion of mesh Right 09/13/2015    Procedure: INSERTION OF MESH;  Surgeon: Mickeal Skinner, MD;  Location: [place];  Service: General;  Laterality: Right;    Patient Care Team: Gayland Curry, DO as PCP - General (Geriatric Medicine) Renella Cunas, MD (  Cardiology) Gayland Curry, DO as Resident (Geriatric Medicine) Mickeal Skinner, MD as Consulting Physician (General Surgery)  Social History   Social History  . Marital Status: Married    Spouse Name: N/A  . Number of Children: N/A  . Years of Education: N/A   Occupational History  . Not on file.   Social History Main Topics  . Smoking status: Never Smoker   . Smokeless tobacco: Never Used  . Alcohol Use: No  . Drug Use: No  . Sexual Activity: Not Currently   Other Topics Concern  . Not on file   Social History Narrative     reports that he has never smoked. He has never used smokeless tobacco. He reports that he does not drink alcohol or use illicit drugs.  Family History  Problem Relation Age of Onset  . Cancer Mother   . Heart attack Father   . Mental illness Brother   . Kidney failure Brother    Family Status  Relation Status Death Age  . Mother Deceased     at age 60 of oral cancer  . Father Deceased     at age 90 of heart attack  . Brother Deceased 7  . Brother Deceased 74    Immunization History  Administered Date(s) Administered  . Influenza Whole 10/02/2013  . Influenza-Unspecified 09/10/2014, 08/12/2015  . Pneumococcal Polysaccharide-23 07/30/2013  . Tdap 08/26/2015  . Zoster 05/29/2014    Allergies  Allergen Reactions  . Statins Other (See Comments)    Pain/weakness  in legs    Medications: Patient's Medications  New Prescriptions   No medications on file  Previous Medications   ALPRAZOLAM (XANAX) 0.25 MG TABLET    Take 1 tablet (0.25 mg total) by mouth 2 (two) times daily as needed for anxiety.   ASPIRIN EC 325 MG TABLET    Take 325 mg by mouth daily.    CARVEDILOL (COREG) 25 MG TABLET    TAKE 1 TABLET BY MOUTH TWICE A DAY WITH A MEAL   CHLORPROMAZINE (THORAZINE) 10 MG TABLET    Take 1 tablet (10 mg total) by mouth 3 (three) times daily.   CHOLECALCIFEROL (VITAMIN D) 1000 UNITS TABLET    Take 1,000 Units by mouth daily with supper.    FUROSEMIDE (LASIX) 40 MG TABLET    Take 2 tablets (80 mg total) by mouth daily.   GLIMEPIRIDE (AMARYL) 1 MG TABLET    Take 1 tablet (1 mg total) by mouth daily with breakfast.   GUAIFENESIN-DEXTROMETHORPHAN (ROBITUSSIN DM) 100-10 MG/5ML SYRUP    Take 10 mLs by mouth every 6 (six) hours.   HYDRALAZINE (APRESOLINE) 25 MG TABLET    TAKE 1 TABLET BY MOUTH 3 TIMES A DAY   ISOSORBIDE MONONITRATE (IMDUR) 60 MG 24 HR TABLET    TAKE 1 TABLET BY MOUTH EVERY DAY   MULTIPLE VITAMIN (MULTIVITAMIN WITH MINERALS) TABS TABLET    Take 1 tablet by mouth daily.   NITROGLYCERIN (NITROSTAT) 0.4 MG SL TABLET    Place 0.4 mg under the tongue every 5 (five) minutes as needed for chest pain.   OMEPRAZOLE (PRILOSEC) 20 MG CAPSULE    Take 20 mg by mouth daily.    OXYCODONE (OXY IR/ROXICODONE) 5 MG IMMEDIATE RELEASE TABLET    Take 1-2 tablets (5-10 mg total) by mouth every 4 (four) hours as needed for moderate pain.   POLYETHYLENE GLYCOL (MIRALAX / GLYCOLAX) PACKET    Take 17 g by mouth daily as needed for  mild constipation.   TAMSULOSIN (FLOMAX) 0.4 MG CAPS CAPSULE    TAKE 1 CAPSULE BY MOUTH ONCE DAILY AFTER SUPPER   TRAVOPROST, BAK FREE, (TRAVATAN) 0.004 % SOLN OPHTHALMIC SOLUTION    Place 1 drop into both eyes daily.   Modified Medications   No medications on file  Discontinued Medications   No medications on file    Review of Systems    Constitutional: Negative for chills, activity change and fatigue.  HENT: Negative for sore throat and trouble swallowing.   Eyes: Negative for visual disturbance.  Respiratory: Negative for cough, chest tightness and shortness of breath.   Cardiovascular: Negative for chest pain, palpitations and leg swelling.  Gastrointestinal: Negative for nausea, vomiting, abdominal pain and blood in stool.  Genitourinary: Negative for urgency, frequency and difficulty urinating.  Musculoskeletal: Negative for arthralgias and gait problem.  Skin: Negative for rash.  Neurological: Negative for weakness and headaches.  Psychiatric/Behavioral: Negative for confusion and sleep disturbance. The patient is not nervous/anxious.     Filed Vitals:   09/30/15 2241  BP: 148/60  Pulse: 70  Temp: 97.8 F (36.6 C)  Weight: 155 lb (70.308 kg)   Body mass index is 23.57 kg/(m^2).  Physical Exam  Constitutional: He is oriented to person, place, and time. He appears well-developed and well-nourished.  HENT:  Mouth/Throat: Oropharynx is clear and moist.  Eyes: Pupils are equal, round, and reactive to light. No scleral icterus.  Neck: Neck supple. Carotid bruit is not present. No thyromegaly present.  Cardiovascular: Normal rate, regular rhythm and intact distal pulses.  Exam reveals no gallop and no friction rub.   Murmur (1/6 SEM) heard. no distal LE swelling. No calf TTP  Pulmonary/Chest: Effort normal. He has no decreased breath sounds. He has no wheezes. He has rhonchi (L>R base). He has no rales. He exhibits no tenderness.  Abdominal: Soft. Bowel sounds are normal. He exhibits no distension, no abdominal bruit, no pulsatile midline mass and no mass. There is no tenderness. There is no rebound and no guarding. Hernia confirmed negative in the right inguinal area (surgical incision intact and healing. no d/c redness or TTP).  Lymphadenopathy:    He has no cervical adenopathy.  Neurological: He is alert and  oriented to person, place, and time.  Skin: Skin is warm and dry. No rash noted.  Psychiatric: He has a normal mood and affect. His behavior is normal. Judgment and thought content normal.     Labs reviewed: Admission on 09/20/2015, Discharged on 09/24/2015  No results displayed because visit has over 200 results.    Admission on 09/13/2015, Discharged on 09/14/2015  Component Date Value Ref Range Status  . Glucose-Capillary 09/13/2015 180* 65 - 99 mg/dL Final  . WBC 09/13/2015 8.2  4.0 - 10.5 K/uL Final  . RBC 09/13/2015 2.96* 4.22 - 5.81 MIL/uL Final  . Hemoglobin 09/13/2015 9.7* 13.0 - 17.0 g/dL Final  . HCT 09/13/2015 29.6* 39.0 - 52.0 % Final  . MCV 09/13/2015 100.0  78.0 - 100.0 fL Final  . MCH 09/13/2015 32.8  26.0 - 34.0 pg Final  . MCHC 09/13/2015 32.8  30.0 - 36.0 g/dL Final  . RDW 09/13/2015 13.5  11.5 - 15.5 % Final  . Platelets 09/13/2015 118* 150 - 400 K/uL Final   CONSISTENT WITH PREVIOUS RESULT  . Creatinine, Ser 09/13/2015 2.17* 0.61 - 1.24 mg/dL Final  . GFR calc non Af Amer 09/13/2015 26* >60 mL/min Final  . GFR calc Af Amer 09/13/2015 30* >60  mL/min Final   Comment: (NOTE) The eGFR has been calculated using the CKD EPI equation. This calculation has not been validated in all clinical situations. eGFR's persistently <60 mL/min signify possible Chronic Kidney Disease.   . Glucose-Capillary 09/13/2015 243* 65 - 99 mg/dL Final  . Comment 1 09/13/2015 Repeat Test   Final  . WBC 09/14/2015 5.6  4.0 - 10.5 K/uL Final   REPEATED TO VERIFY  . RBC 09/14/2015 2.55* 4.22 - 5.81 MIL/uL Final  . Hemoglobin 09/14/2015 8.2* 13.0 - 17.0 g/dL Final   REPEATED TO VERIFY  . HCT 09/14/2015 25.7* 39.0 - 52.0 % Final  . MCV 09/14/2015 100.8* 78.0 - 100.0 fL Final  . MCH 09/14/2015 32.2  26.0 - 34.0 pg Final  . MCHC 09/14/2015 31.9  30.0 - 36.0 g/dL Final  . RDW 09/14/2015 13.6  11.5 - 15.5 % Final  . Platelets 09/14/2015 104* 150 - 400 K/uL Final   Comment: REPEATED TO  VERIFY CONSISTENT WITH PREVIOUS RESULT   . Sodium 09/14/2015 137  135 - 145 mmol/L Final  . Potassium 09/14/2015 4.5  3.5 - 5.1 mmol/L Final  . Chloride 09/14/2015 105  101 - 111 mmol/L Final  . CO2 09/14/2015 28  22 - 32 mmol/L Final  . Glucose, Bld 09/14/2015 185* 65 - 99 mg/dL Final  . BUN 09/14/2015 35* 6 - 20 mg/dL Final  . Creatinine, Ser 09/14/2015 2.37* 0.61 - 1.24 mg/dL Final  . Calcium 09/14/2015 8.2* 8.9 - 10.3 mg/dL Final  . GFR calc non Af Amer 09/14/2015 23* >60 mL/min Final  . GFR calc Af Amer 09/14/2015 27* >60 mL/min Final   Comment: (NOTE) The eGFR has been calculated using the CKD EPI equation. This calculation has not been validated in all clinical situations. eGFR's persistently <60 mL/min signify possible Chronic Kidney Disease.   . Anion gap 09/14/2015 4* 5 - 15 Final  . Glucose-Capillary 09/13/2015 190* 65 - 99 mg/dL Final  . Glucose-Capillary 09/13/2015 182* 65 - 99 mg/dL Final  . Comment 1 09/13/2015 Notify RN   Final  . Glucose-Capillary 09/14/2015 168* 65 - 99 mg/dL Final  Hospital Outpatient Visit on 09/10/2015  Component Date Value Ref Range Status  . WBC 09/10/2015 5.4  4.0 - 10.5 K/uL Final  . RBC 09/10/2015 3.75* 4.22 - 5.81 MIL/uL Final  . Hemoglobin 09/10/2015 12.5* 13.0 - 17.0 g/dL Final  . HCT 09/10/2015 37.2* 39.0 - 52.0 % Final  . MCV 09/10/2015 99.2  78.0 - 100.0 fL Final  . MCH 09/10/2015 33.3  26.0 - 34.0 pg Final  . MCHC 09/10/2015 33.6  30.0 - 36.0 g/dL Final  . RDW 09/10/2015 13.4  11.5 - 15.5 % Final  . Platelets 09/10/2015 118* 150 - 400 K/uL Final   PLATELET COUNT CONFIRMED BY SMEAR  . Sodium 09/10/2015 138  135 - 145 mmol/L Final  . Potassium 09/10/2015 4.4  3.5 - 5.1 mmol/L Final  . Chloride 09/10/2015 104  101 - 111 mmol/L Final  . CO2 09/10/2015 22  22 - 32 mmol/L Final  . Glucose, Bld 09/10/2015 147* 65 - 99 mg/dL Final  . BUN 09/10/2015 34* 6 - 20 mg/dL Final  . Creatinine, Ser 09/10/2015 1.94* 0.61 - 1.24 mg/dL Final  .  Calcium 09/10/2015 9.3  8.9 - 10.3 mg/dL Final  . GFR calc non Af Amer 09/10/2015 29* >60 mL/min Final  . GFR calc Af Amer 09/10/2015 34* >60 mL/min Final   Comment: (NOTE) The eGFR has been calculated  using the CKD EPI equation. This calculation has not been validated in all clinical situations. eGFR's persistently <60 mL/min signify possible Chronic Kidney Disease.   . Anion gap 09/10/2015 12  5 - 15 Final  Appointment on 08/11/2015  Component Date Value Ref Range Status  . WBC 08/11/2015 6.1  4.0 - 10.3 10e3/uL Final  . NEUT# 08/11/2015 3.4  1.5 - 6.5 10e3/uL Final  . HGB 08/11/2015 12.2* 13.0 - 17.1 g/dL Final  . HCT 08/11/2015 36.8* 38.4 - 49.9 % Final  . Platelets 08/11/2015 135* 140 - 400 10e3/uL Final  . MCV 08/11/2015 98.4* 79.3 - 98.0 fL Final  . MCH 08/11/2015 32.7  27.2 - 33.4 pg Final  . MCHC 08/11/2015 33.2  32.0 - 36.0 g/dL Final  . RBC 08/11/2015 3.74* 4.20 - 5.82 10e6/uL Final  . RDW 08/11/2015 13.6  11.0 - 14.6 % Final  . lymph# 08/11/2015 2.2  0.9 - 3.3 10e3/uL Final  . MONO# 08/11/2015 0.4  0.1 - 0.9 10e3/uL Final  . Eosinophils Absolute 08/11/2015 0.1  0.0 - 0.5 10e3/uL Final  . Basophils Absolute 08/11/2015 0.0  0.0 - 0.1 10e3/uL Final  . NEUT% 08/11/2015 55.1  39.0 - 75.0 % Final  . LYMPH% 08/11/2015 36.1  14.0 - 49.0 % Final  . MONO% 08/11/2015 7.1  0.0 - 14.0 % Final  . EOS% 08/11/2015 1.4  0.0 - 7.0 % Final  . BASO% 08/11/2015 0.3  0.0 - 2.0 % Final    Dg Chest 2 View  09/20/2015  CLINICAL DATA:  Right-sided chest pain since 10/30 last night. Shortness of breath. History of hypertension. EXAM: CHEST  2 VIEW COMPARISON:  06/28/2014 FINDINGS: Postoperative changes in the mediastinum. Cardiac enlargement. No pulmonary vascular congestion. There is infiltration or atelectasis in the left lung base behind the heart. No blunting of costophrenic angles. No pneumothorax. Mediastinal contours appear intact. Calcification of the aorta. Degenerative changes in the  spine. IMPRESSION: Infiltration or atelectasis in the left lung base behind the heart. Electronically Signed   By: Lucienne Capers M.D.   On: 09/20/2015 06:30   Dg Chest Port 1 View  09/20/2015  CLINICAL DATA:  Tachypnea. EXAM: PORTABLE CHEST 1 VIEW COMPARISON:  09/20/2015 at 6:04 a.m. FINDINGS: There changes from cardiac surgery, stable. Cardiac silhouette is mildly enlarged. Left lung base streaky opacity noted on the prior study is stable. Mild prominence of the bronchovascular markings. Lungs otherwise clear. No convincing pleural effusion and no pneumothorax. No mediastinal or hilar masses or convincing adenopathy. IMPRESSION: 1. No convincing change from the earlier study. 2. Opacity noted behind the left heart on the prior study is unchanged. This is most likely atelectasis. Pneumonia not excluded. No evidence of pulmonary edema. Electronically Signed   By: Lajean Manes M.D.   On: 09/20/2015 19:00   Dg Swallowing Func-speech Pathology  09/21/2015  Objective Swallowing Evaluation:   Patient Details Name: Javelle Donigan MRN: [id number] Date of Birth: 05-Sep-1926 Today's Date: 09/21/2015 Time: SLP Start Time (ACUTE ONLY): 1330-SLP Stop Time (ACUTE ONLY): 1400 SLP Time Calculation (min) (ACUTE ONLY): 30 min Past Medical History: Past Medical History Diagnosis Date . History of colon cancer    s/p colon resection . Aortic stenosis    a. s/p AVR with 45m Edwards pericardial valve 02/07/12 - post-op course complicated by pleural effusion requring thoracentesis/leg cellulitis 03/2012. .Marland KitchenCAD (coronary artery disease)    a. s/p NSTEMI 12/2011:  LHC - Ostial left main 20%, ostial LAD 50%, mid 60-70%, ostial D1  40% and mid 40%, D2 70%, ostial circumflex occluded, ostial RCA 80-90%, LVEDP was 42. b.  s/p CABG x 3 at time of AVR (LiMA-LAD, SVG-2nd daigonal, SVG-PDA) 02/07/12 (post-op course noted above). . Ischemic cardiomyopathy  . Chronic systolic heart failure ([place])    a. TEE 01/2012: EF 25-30%, diffuse hypokinesis.  b. Not on ACEI due to renal insufficiency.;  c. follow up  echo 08/06/12: EF 25%, mod diast dysfxn, AVR ok, mild MR, mod LAE, mild RAE, mild to mod RV systolic dysfunction . HLD (hyperlipidemia)  . GERD (gastroesophageal reflux disease)  . Chronic kidney disease  . Pleural effusion    a. post-operatively after AVR/CABG s/p thoracentesis 03/2012 yielding 1L serosanguinous fluid. . Diabetes mellitus    borderline . Blood transfusion    NO REACTION TO TRANSFUSION . Cellulitis    a. RLE cellulitis 2 months post-operatively after AVR/CABG - serratia marcessans, tx with I&D/antibiotics . Mitral regurgitation    Moderate by TEE 01/2012 . Flash pulmonary edema ([place])    Post-cath 12/2011, went into acute pulm edema requiring IV lasix and intubation . Baker's cyst 07/23/12   Incidental finding of LE venous dopplers . Cataract    right eye, hx of . HTN (hypertension)    primary, Dr. Hollace Kinnier . Myocardial infarction ([place]) 12/2011 . CHF (congestive heart failure) ([place])  . Stroke ([place]) 10/01   RIght Leg weakness . [facility] '90's   Colon . Anemia  Past Surgical History: Past Surgical History Procedure Laterality Date . Colon resection  1996 . Esophageal dilation   . Colonoscopy   . Cataract extraction     rt . Cardiac catheterization     1.3.13  stopped breathing, put on ventilator for 5-6 days . Tonsillectomy   . Aortic valve replacement  02/07/2012   Procedure: AORTIC VALVE REPLACEMENT (AVR);  Surgeon: Gaye Pollack, MD;  Location: [place];  Service: Open Heart Surgery;  Laterality: N/A; . Coronary artery bypass graft  02/07/2012   Procedure: CORONARY ARTERY BYPASS GRAFTING (CABG);  Surgeon: Gaye Pollack, MD;  Location: [place];  Service: Open Heart Surgery;  Laterality: N/A;  CABG x three; using right leg greater saphenous vein harvested endoscopically . Chest tube insertion  07/24/2012   Procedure: INSERTION PLEURAL DRAINAGE CATHETER;  Surgeon: Gaye Pollack, MD;  Location: [place];  Service: Thoracic;  Laterality: Left; . Talc  pleurodesis  08/30/2012   Procedure: TALC PLEURADESIS;  Surgeon: Gaye Pollack, MD;  Location: [place];  Service: Thoracic;  Laterality: Left;  INSERTION OF TALC VIA LEFT PLEURX . Talc pleurodesis  09/12/2012   Procedure: TALC PLEURADESIS;  Surgeon: Gaye Pollack, MD;  Location: [place];  Service: Thoracic;  Laterality: Left; . Removal of pleural drainage catheter  09/27/2012   Procedure: REMOVAL OF PLEURAL DRAINAGE CATHETER;  Surgeon: Gaye Pollack, MD;  Location: [place];  Service: Thoracic;  Laterality: Left; Marland Kitchen Eye surgery  2009   cataract removed from right eye . Esophagogastroduodenoscopy N/A 07/03/2014   Procedure: ESOPHAGOGASTRODUODENOSCOPY (EGD);  Surgeon: Arta Silence, MD;  Location: [facility] ENDOSCOPY;  Service: Endoscopy;  Laterality: N/A; . Esophagoscopy with dilitation N/A 07/07/2014   Procedure: ESOPHAGOSCOPY WITH DILITATION/SAVARY DILATOR;  Surgeon: Rozetta Nunnery, MD;  Location: [place];  Service: ENT;  Laterality: N/A; . Ercp N/A 07/08/2014   Procedure: ENDOSCOPIC RETROGRADE CHOLANGIOPANCREATOGRAPHY (ERCP);  Surgeon: Missy Sabins, MD;  Location: [facility] ENDOSCOPY;  Service: Endoscopy;  Laterality: N/A; . Left heart catheterization with coronary angiogram N/A 12/13/2011  Procedure: LEFT HEART CATHETERIZATION WITH CORONARY ANGIOGRAM;  Surgeon: Peter M Martinique, MD;  Location: [facility] CATH LAB;  Service: Cardiovascular;  Laterality: N/A; . Inguinal hernia repair Right 09/13/2015   Procedure: OPEN RIGHT INGUINAL HERNIA;  Surgeon: Mickeal Skinner, MD;  Location: [place];  Service: General;  Laterality: Right; . Insertion of mesh Right 09/13/2015   Procedure: INSERTION OF MESH;  Surgeon: Arta Bruce Kinsinger, MD;  Location: [place];  Service: General;  Laterality: Right; HPI: Other Pertinent Information: Lorenzo Arscott is a 81 y.o. male with multiple medical problems not limited to CAD, ischemic cardiomyopathy, CKD, anemia of chronic disease and colon cancer, s/p resection, and pneumonia. He presented to the ED on 09/20/15  with complaints of constant non-radiating right sided chest pain. Also had hiccups since inhuinal repair on 10/3, chest pain worse with hiccup. CXR showed infiltration or atelectasis in the left lung base behind the heart. Currently treated for suspected aspiration pneumonia v. HCAP. Pt has a history of cervical esophageal web and dilation, latest dilation July 2015.  Pt is NPO. No Data Recorded Assessment / Plan / Recommendation CHL IP CLINICAL IMPRESSIONS 09/21/2015 Therapy Diagnosis Moderate cervical esophageal phase dysphagia;Mild pharyngeal phase dysphagia;Mild oral phase dysphagia Clinical Impression Pt demonstrated mild oral and oropharyngeal and moderate cervical esophageal dysphagia characterized by delayed swallow initation and esophageal transit. Due to incomplete epiglottic closure and backflow from cervical esophagus, pooling in the pyriform, pt penetrated/aspirated thin liquids and penetrated nectar. Pt sensed aspiration but did not consistently sense penetration. Penetration/Aspiration was not improved by chin tuck, but was improved by hard throat clear, expelled penetrate and phlegm. SLP provided moderate modeling and verbal cues to clear throat and swallow twice to clear valleculae and pyriform residue. The SLP cut the pill into four pieces, the pt did not clear the small portion of the pill  from the cervical esophagus with thin liquid, did clear with puree. Strongly recommend pills crushed in puree due to risk of stasis and backflow after pills, dysphagia 2 (fine chop) (due to decreased mastication of solids), and thin liquids with throat clear and second swallow. SLP will f/u with check for diet tolerance and compensatory strategy training.    CHL IP TREATMENT RECOMMENDATION 09/21/2015 Treatment Recommendations Therapy as outlined in treatment plan below   CHL IP DIET RECOMMENDATION 09/21/2015 SLP Diet Recommendations Dysphagia 2 (Fine chop);Thin Liquid Administration via (None) Medication  Administration Crushed with puree Compensations Small sips/bites;Slow rate;Multiple dry swallows after each bite/sip;Follow solids with liquid;Clear throat intermittently Postural Changes and/or Swallow Maneuvers (None)   CHL IP OTHER RECOMMENDATIONS 09/21/2015 Recommended Consults (None) Oral Care Recommendations Oral care QID Other Recommendations (None)   No flowsheet data found.  CHL IP FREQUENCY AND DURATION 09/21/2015 Speech Therapy Frequency (ACUTE ONLY) min 2x/week Treatment Duration 2 weeks   Pertinent Vitals/Pain NA  SLP Swallow Goals No flowsheet data found. No flowsheet data found.   CHL IP REASON FOR REFERRAL 09/21/2015 Reason for Referral Objectively evaluate swallowing function   CHL IP ORAL PHASE 09/21/2015 Lips (None) Tongue (None) Mucous membranes (None) Nutritional status (None) Other (None) Oxygen therapy (None) Oral Phase Impaired Oral - Pudding Teaspoon (None) Oral - Pudding Cup (None) Oral - Honey Teaspoon (None) Oral - Honey Cup (None) Oral - Honey Syringe (None) Oral - Nectar Teaspoon (None) Oral - Nectar Cup (None) Oral - Nectar Straw (None) Oral - Nectar Syringe (None) Oral - Ice Chips (None) Oral - Thin Teaspoon (None) Oral - Thin Cup (None) Oral - Thin Straw (None)  Oral - Thin Syringe (None) Oral - Puree (None) Oral - Mechanical Soft (None) Oral - Regular (None) Oral - Multi-consistency (None) Oral - Pill (None) Oral Phase - Comment (None)   CHL IP PHARYNGEAL PHASE 09/21/2015 Pharyngeal Phase Impaired Pharyngeal - Pudding Teaspoon (None) Penetration/Aspiration details (pudding teaspoon) (None) Pharyngeal - Pudding Cup (None) Penetration/Aspiration details (pudding cup) (None) Pharyngeal - Honey Teaspoon (None) Penetration/Aspiration details (honey teaspoon) (None) Pharyngeal - Honey Cup (None) Penetration/Aspiration details (honey cup) (None) Pharyngeal - Honey Syringe (None) Penetration/Aspiration details (honey syringe) (None) Pharyngeal - Nectar Teaspoon (None) Penetration/Aspiration  details (nectar teaspoon) (None) Pharyngeal - Nectar Cup (None) Penetration/Aspiration details (nectar cup) (None) Pharyngeal - Nectar Straw (None) Penetration/Aspiration details (nectar straw) (None) Pharyngeal - Nectar Syringe (None) Penetration/Aspiration details (nectar syringe) (None) Pharyngeal - Ice Chips (None) Penetration/Aspiration details (ice chips) (None) Pharyngeal - Thin Teaspoon (None) Penetration/Aspiration details (thin teaspoon) (None) Pharyngeal - Thin Cup (None) Penetration/Aspiration details (thin cup) (None) Pharyngeal - Thin Straw (None) Penetration/Aspiration details (thin straw) (None) Pharyngeal - Thin Syringe (None) Penetration/Aspiration details (thin syringe') (None) Pharyngeal - Puree (None) Penetration/Aspiration details (puree) (None) Pharyngeal - Mechanical Soft (None) Penetration/Aspiration details (mechanical soft) (None) Pharyngeal - Regular (None) Penetration/Aspiration details (regular) (None) Pharyngeal - Multi-consistency (None) Penetration/Aspiration details (multi-consistency) (None) Pharyngeal - Pill (None) Penetration/Aspiration details (pill) (None) Pharyngeal Comment (None)   CHL IP CERVICAL ESOPHAGEAL PHASE 09/21/2015 Cervical Esophageal Phase Impaired Pudding Teaspoon (None) Pudding Cup (None) Honey Teaspoon (None) Honey Cup (None) Honey Straw (None) Nectar Teaspoon (None) Nectar Cup Esophageal backflow into the pharynx;Esophageal backflow into cervical esophagus Nectar Straw (None) Nectar Sippy Cup (None) Thin Teaspoon (None) Thin Cup Esophageal backflow into the pharynx;Esophageal backflow into cervical esophagus Thin Straw Esophageal backflow into the pharynx;Esophageal backflow into cervical esophagus Thin Sippy Cup (None) Cervical Esophageal Comment (None) CHL IP GO 09/21/2015 Functional Assessment Tool Used clinical judgement Functional Limitations Swallowing Swallow Current Status (R4431) CJ Swallow Goal Status (V4008) CI Swallow Discharge Status (Q7619) (None)  Motor Speech Current Status (J0932) (None) Motor Speech Goal Status (I7124) (None) Motor Speech Goal Status (P8099) (None) Spoken Language Comprehension Current Status (I3382) (None) Spoken Language Comprehension Goal Status (N0539) (None) Spoken Language Comprehension Discharge Status [phone]) (None) Spoken Language Expression Current Status [phone]) (None) Spoken Language Expression Goal Status (K2409) (None) Spoken Language Expression Discharge Status [phone]) (None) Attention Current Status (D9242) (None) Attention Goal Status (A8341) (None) Attention Discharge Status [phone]) (None) Memory Current Status (L7989) (None) Memory Goal Status (Q1194) (None) Memory Discharge Status (R7408) (None) Voice Current Status (X4481) (None) Voice Goal Status (E5631) (None) Voice Discharge Status (S9702) (None) Other Speech-Language Pathology Functional Limitation [phone]) (None) Other Speech-Language Pathology Functional Limitation Goal Status (Y8502) (None) Other Speech-Language Pathology Functional Limitation Discharge Status [phone]) (None) Completed by Lanier Ensign, SLP Student Supervised and reviewed by Herbie Baltimore MA CCC-SLP   DeBlois, Katherene Ponto 09/21/2015, 3:12 PM     Assessment/Plan   ICD-9-CM ICD-10-CM   1. HCAP (healthcare-associated pneumonia) 54 J18.9   2. PAF (paroxysmal atrial fibrillation) (HCC) 427.31 I48.0   3. Chronic combined systolic and diastolic heart failure, NYHA class 2 (HCC) 428.42 I50.42   4. Chronic kidney disease, stage III (moderate): Cr ~2.2 baseline 585.3 N18.3   5. Essential hypertension 401.9 I10   6. Controlled type 2 diabetes mellitus with complication, without long-term current use of insulin (HCC) 250.90 E11.8   7. Atherosclerosis of native coronary artery of native heart with unstable angina pectoris (HCC) 414.01 I25.110    411.1    8. S/P hernia  repair V45.89 [id number]     Z87.19    right inguinal 09/13/15   Cont current meds as ordered  PT/OT/ST as ordered  F/u  with specialists as scheduled   Cont CBG BID  GOAL: short term rehab and d/c home when medically appropriate. Communicated with pt and nursing.  Will follow  Jariel Drost S. Perlie Gold  [facility] [address] [place],  [address] [phone] Cell (Monday-Friday 8 AM - 5 PM) [phone] After 5 PM and follow prompts

## 2015-10-03 ENCOUNTER — Other Ambulatory Visit: Payer: Self-pay | Admitting: Internal Medicine

## 2015-10-06 ENCOUNTER — Ambulatory Visit (HOSPITAL_COMMUNITY)
Admit: 2015-10-06 | Discharge: 2015-10-06 | Disposition: A | Discharge status: Home / Self Care | Payer: No Typology Code available for payment source | Source: Ambulatory Visit | Attending: Cardiology | Admitting: Cardiology

## 2015-10-06 VITALS — BP 138/52 | HR 55 | Wt 154.0 lb

## 2015-10-06 DIAGNOSIS — N189 Chronic kidney disease, unspecified: Secondary | ICD-10-CM | POA: Insufficient documentation

## 2015-10-06 DIAGNOSIS — I255 Ischemic cardiomyopathy: Secondary | ICD-10-CM | POA: Insufficient documentation

## 2015-10-06 DIAGNOSIS — I130 Hypertensive heart and chronic kidney disease with heart failure and stage 1 through stage 4 chronic kidney disease, or unspecified chronic kidney disease: Secondary | ICD-10-CM | POA: Insufficient documentation

## 2015-10-06 DIAGNOSIS — Z952 Presence of prosthetic heart valve: Secondary | ICD-10-CM

## 2015-10-06 DIAGNOSIS — I5042 Chronic combined systolic (congestive) and diastolic (congestive) heart failure: Secondary | ICD-10-CM

## 2015-10-06 DIAGNOSIS — Z951 Presence of aortocoronary bypass graft: Secondary | ICD-10-CM | POA: Diagnosis not present

## 2015-10-06 DIAGNOSIS — Z79899 Other long term (current) drug therapy: Secondary | ICD-10-CM | POA: Insufficient documentation

## 2015-10-06 DIAGNOSIS — Z954 Presence of other heart-valve replacement: Secondary | ICD-10-CM

## 2015-10-06 DIAGNOSIS — K219 Gastro-esophageal reflux disease without esophagitis: Secondary | ICD-10-CM | POA: Insufficient documentation

## 2015-10-06 DIAGNOSIS — E785 Hyperlipidemia, unspecified: Secondary | ICD-10-CM | POA: Insufficient documentation

## 2015-10-06 DIAGNOSIS — I252 Old myocardial infarction: Secondary | ICD-10-CM | POA: Insufficient documentation

## 2015-10-06 DIAGNOSIS — Z8673 Personal history of transient ischemic attack (TIA), and cerebral infarction without residual deficits: Secondary | ICD-10-CM | POA: Insufficient documentation

## 2015-10-06 DIAGNOSIS — I2510 Atherosclerotic heart disease of native coronary artery without angina pectoris: Secondary | ICD-10-CM | POA: Insufficient documentation

## 2015-10-06 DIAGNOSIS — Z85038 Personal history of other malignant neoplasm of large intestine: Secondary | ICD-10-CM | POA: Insufficient documentation

## 2015-10-06 DIAGNOSIS — N183 Chronic kidney disease, stage 3 unspecified: Secondary | ICD-10-CM

## 2015-10-06 DIAGNOSIS — D631 Anemia in chronic kidney disease: Secondary | ICD-10-CM | POA: Diagnosis not present

## 2015-10-06 DIAGNOSIS — R7303 Prediabetes: Secondary | ICD-10-CM | POA: Insufficient documentation

## 2015-10-06 DIAGNOSIS — Z8249 Family history of ischemic heart disease and other diseases of the circulatory system: Secondary | ICD-10-CM | POA: Insufficient documentation

## 2015-10-06 DIAGNOSIS — I5022 Chronic systolic (congestive) heart failure: Secondary | ICD-10-CM | POA: Insufficient documentation

## 2015-10-06 DIAGNOSIS — Z7982 Long term (current) use of aspirin: Secondary | ICD-10-CM | POA: Insufficient documentation

## 2015-10-06 DIAGNOSIS — Z7984 Long term (current) use of oral hypoglycemic drugs: Secondary | ICD-10-CM | POA: Insufficient documentation

## 2015-10-06 DIAGNOSIS — Z953 Presence of xenogenic heart valve: Secondary | ICD-10-CM | POA: Insufficient documentation

## 2015-10-06 LAB — BASIC METABOLIC PANEL
Anion gap: 11 (ref 5–15)
BUN: 37 mg/dL — AB (ref 6–20)
CALCIUM: 9 mg/dL (ref 8.9–10.3)
CO2: 26 mmol/L (ref 22–32)
CREATININE: 2.05 mg/dL — AB (ref 0.61–1.24)
Chloride: 103 mmol/L (ref 101–111)
GFR, EST AFRICAN AMERICAN: 32 mL/min — AB (ref >60)
GFR, EST NON AFRICAN AMERICAN: 27 mL/min — AB (ref >60)
Glucose, Bld: 128 mg/dL — ABNORMAL HIGH (ref 65–99)
Potassium: 4.5 mmol/L (ref 3.5–5.1)
SODIUM: 140 mmol/L (ref 135–145)

## 2015-10-06 LAB — CBC
HEMATOCRIT: 29.4 % — AB (ref 39.0–52.0)
Hemoglobin: 9.5 g/dL — ABNORMAL LOW (ref 13.0–17.0)
MCH: 32.1 pg (ref 26.0–34.0)
MCHC: 32.3 g/dL (ref 30.0–36.0)
MCV: 99.3 fL (ref 78.0–100.0)
Platelets: 248 10*3/uL (ref 150–400)
RBC: 2.96 MIL/uL — ABNORMAL LOW (ref 4.22–5.81)
RDW: 13.6 % (ref 11.5–15.5)
WBC: 6 10*3/uL (ref 4.0–10.5)

## 2015-10-06 LAB — BRAIN NATRIURETIC PEPTIDE: B NATRIURETIC PEPTIDE 5: 1338.4 pg/mL — AB (ref 0.0–100.0)

## 2015-10-06 NOTE — Patient Instructions (Signed)
Labs today will call if abnormal   Continue current medications  Follow up in 6 weeks

## 2015-10-06 NOTE — Progress Notes (Signed)
Patient ID: [name] [name], male   DOB: 07/07/1926, 81 y.o.   MRN: [id number] PCP: Hollace Kinnier  HPI: [name] is a 81 y.o. male with a PMHx of CAD, CABG, bioprosthetic AVR, anemia, CKD last creatinine 2.2, Chronic systolic heart failure EF 40-45%. He is not on an ACE or ARB due renal failure.   Ad[facility] to [facility] 4/28 through 04/15/13  with volume overload. Diuresed with IV lasix. He was not discharged on diuretics. Discharge weight 139 pounds.   He had a hernia repair in 10/16 and developed volume overload after.  Lasix was increased but he had progressive dyspnea.  He was readmitted and found to have AKI with anemia and HCAP.  He was treated for PNA and transfused 2 units PRBCs.  He was discharged to short-term rehab and remains there today.   Echo in 10/16 showed EF 40-45%, inferior/inferolateral/inferoseptal akinesis, bioprosthetic AoV with mean gradient 12 mmHg, RV with moderately decreased systolic function, PASP 54 mmHg.   He seems to be doing fairly well in rehab.  He has been on oxygen since leaving the hospital, but oxygen saturation is 95% on room air today.  He has been walking with PT but came in in a wheelchair today. No dyspnea walking short distances.  No orthopnea/PND.  No chest pain.  No lightheadedness.    Labs: 05/28/13 Creatinine 2.37 Potassium 4.3 7/14 Creatinine 2.2 3/15 HCT 35.9 10/16 K 3.5, creatinine 2.82, HCT 29.6  Past Medical History  Diagnosis Date  . History of colon cancer     s/p colon resection  . Aortic stenosis     a. s/p AVR with 84mm Edwards pericardial valve 02/07/12 - post-op course complicated by pleural effusion requring thoracentesis/leg cellulitis 03/2012.  Marland Kitchen CAD (coronary artery disease)     a. s/p NSTEMI 12/2011:  LHC - Ostial left main 20%, ostial LAD 50%, mid 60-70%, ostial D1 40% and mid 40%, D2 70%, ostial circumflex occluded, ostial RCA 80-90%, LVEDP was 42. b.  s/p CABG x 3 at time of AVR (LiMA-LAD, SVG-2nd daigonal, SVG-PDA) 02/07/12 (post-op  course noted above).  . Ischemic cardiomyopathy   . Chronic systolic heart failure ([place])     a. TEE 01/2012: EF 25-30%, diffuse hypokinesis. b. Not on ACEI due to renal insufficiency.;  c. follow up  echo 08/06/12: EF 25%, mod diast dysfxn, AVR ok, mild MR, mod LAE, mild RAE, mild to mod RV systolic dysfunction  . HLD (hyperlipidemia)   . GERD (gastroesophageal reflux disease)   . Chronic kidney disease   . Pleural effusion     a. post-operatively after AVR/CABG s/p thoracentesis 03/2012 yielding 1L serosanguinous fluid.  . Diabetes mellitus     borderline  . Blood transfusion     NO REACTION TO TRANSFUSION  . Cellulitis     a. RLE cellulitis 2 months post-operatively after AVR/CABG - serratia marcessans, tx with I&D/antibiotics  . Mitral regurgitation     Moderate by TEE 01/2012  . Flash pulmonary edema ([place])     Post-cath 12/2011, went into acute pulm edema requiring IV lasix and intubation  . Baker's cyst 07/23/12    Incidental finding of LE venous dopplers  . Cataract     right eye, hx of  . HTN (hypertension)     primary, Dr. Hollace Kinnier  . Myocardial infarction ([place]) 12/2011  . CHF (congestive heart failure) ([place])   . Stroke ([place]) 10/01    RIght Leg weakness  . Cancer [facility]) '90's    Colon  .  Anemia     Current Outpatient Prescriptions  Medication Sig Dispense Refill  . ALPRAZolam (XANAX) 0.25 MG tablet Take 1 tablet (0.25 mg total) by mouth 2 (two) times daily as needed for anxiety. 30 tablet 0  . aspirin EC 325 MG tablet Take 325 mg by mouth daily.     . carvedilol (COREG) 25 MG tablet TAKE 1 TABLET BY MOUTH TWICE A DAY WITH A MEAL 180 tablet 0  . chlorproMAZINE (THORAZINE) 10 MG tablet Take 1 tablet (10 mg total) by mouth 3 (three) times daily. 15 tablet 0  . cholecalciferol (VITAMIN D) 1000 UNITS tablet Take 1,000 Units by mouth daily with supper.     . furosemide (LASIX) 40 MG tablet Take 2 tablets (80 mg total) by mouth daily. 180 tablet 1  . glimepiride (AMARYL) 1 MG  tablet Take 1 tablet (1 mg total) by mouth daily with breakfast. 30 tablet 0  . guaiFENesin-dextromethorphan (ROBITUSSIN DM) 100-10 MG/5ML syrup Take 10 mLs by mouth every 6 (six) hours. 118 mL 0  . hydrALAZINE (APRESOLINE) 25 MG tablet TAKE 1 TABLET BY MOUTH 3 TIMES A DAY 270 tablet 3  . isosorbide mononitrate (IMDUR) 60 MG 24 hr tablet TAKE 1 TABLET BY MOUTH EVERY DAY 30 tablet 6  . Multiple Vitamin (MULTIVITAMIN WITH MINERALS) TABS tablet Take 1 tablet by mouth daily.    . nitroGLYCERIN (NITROSTAT) 0.4 MG SL tablet Place 0.4 mg under the tongue every 5 (five) minutes as needed for chest pain.    Marland Kitchen omeprazole (PRILOSEC) 20 MG capsule Take 20 mg by mouth daily.     Marland Kitchen oxyCODONE (OXY IR/ROXICODONE) 5 MG immediate release tablet Take 1-2 tablets (5-10 mg total) by mouth every 4 (four) hours as needed for moderate pain. 30 tablet 0  . polyethylene glycol (MIRALAX / GLYCOLAX) packet Take 17 g by mouth daily as needed for mild constipation. 14 each 0  . tamsulosin (FLOMAX) 0.4 MG CAPS capsule TAKE 1 CAPSULE EVERY DAY AFTER DINNER 30 capsule 5  . Travoprost, BAK Free, (TRAVATAN) 0.004 % SOLN ophthalmic solution Place 1 drop into both eyes daily.     . [DISCONTINUED] metFORMIN (GLUCOPHAGE) 500 MG tablet Take 500-1,000 mg by mouth. 2 tabs in the am, 1 tab in the pm     No current facility-administered medications for this encounter.     Allergies  Allergen Reactions  . Statins Other (See Comments)    Pain/weakness in legs    Social History   Social History  . Marital Status: Married    Spouse Name: N/A  . Number of Children: N/A  . Years of Education: N/A   Occupational History  . Not on file.   Social History Main Topics  . Smoking status: Never Smoker   . Smokeless tobacco: Never Used  . Alcohol Use: No  . Drug Use: No  . Sexual Activity: Not Currently   Other Topics Concern  . Not on file   Social History Narrative    Family History  Problem Relation Age of Onset  . Cancer  Mother   . Heart attack Father   . Mental illness Brother   . Kidney failure Brother     PHYSICAL EXAM: Filed Vitals:   10/06/15 1534  BP: 138/52  Pulse: 55   General:  Elderly chronically ill appearing. No respiratory difficulty Wife  present HEENT: normal Neck: supple. JVD 5-6. Carotids 2+ bilat; bilateral carotid bruits. No lymphadenopathy or thryomegaly appreciated. Cor: PMI nondisplaced. Regular rate &  rhythm. No rubs, gallops.  1/6 SEM RUSB.  Lungs: clear Abdomen: soft, nontender, nondistended. No hepatosplenomegaly. No bruits or masses. Good bowel sounds. Extremities: no cyanosis, clubbing, rash, edema Neuro: alert & oriented x 3, cranial nerves grossly intact. moves all 4 extremities w/o difficulty. Affect pleasant.  ASSESSMENT & PLAN: 1. CAD: S/p CABG.  No chest pain. Continue ASA, Coreg.  He is not on a statin due to myalgias.  2. Aortic valve replacement: Bioprosthetic.  Stable on last echo in 10/16.  3. Chronic systolic CHF: Ischemic cardiomyopathy.  EF 40-45% on last echo.  Volume status looks ok today. - Continue Lasix 80 mg daily.  - Continue Coreg, hydralazine, and Imdur at current doses.  - Will check BMET/BNP today.  - Not on ACEI because of CKD.  4. CKD: BMET today.  5. Anemia: Anemia of CKD and chronic disease.  Will get CBC today.   Followup 6 weeks.   Loralie Champagne 10/06/2015

## 2015-10-07 ENCOUNTER — Telehealth (HOSPITAL_COMMUNITY): Payer: Self-pay | Admitting: Vascular Surgery

## 2015-10-07 NOTE — Telephone Encounter (Signed)
Left pt message to get 6 week f/u

## 2015-10-16 DIAGNOSIS — I130 Hypertensive heart and chronic kidney disease with heart failure and stage 1 through stage 4 chronic kidney disease, or unspecified chronic kidney disease: Secondary | ICD-10-CM | POA: Diagnosis not present

## 2015-10-16 DIAGNOSIS — E1122 Type 2 diabetes mellitus with diabetic chronic kidney disease: Secondary | ICD-10-CM | POA: Diagnosis not present

## 2015-10-16 DIAGNOSIS — I5041 Acute combined systolic (congestive) and diastolic (congestive) heart failure: Secondary | ICD-10-CM | POA: Diagnosis not present

## 2015-10-16 DIAGNOSIS — R2689 Other abnormalities of gait and mobility: Secondary | ICD-10-CM | POA: Diagnosis not present

## 2015-10-18 DIAGNOSIS — E1122 Type 2 diabetes mellitus with diabetic chronic kidney disease: Secondary | ICD-10-CM | POA: Diagnosis not present

## 2015-10-18 DIAGNOSIS — R2689 Other abnormalities of gait and mobility: Secondary | ICD-10-CM | POA: Diagnosis not present

## 2015-10-19 ENCOUNTER — Telehealth: Payer: Self-pay

## 2015-10-19 DIAGNOSIS — R2689 Other abnormalities of gait and mobility: Secondary | ICD-10-CM | POA: Diagnosis not present

## 2015-10-19 DIAGNOSIS — E1122 Type 2 diabetes mellitus with diabetic chronic kidney disease: Secondary | ICD-10-CM | POA: Diagnosis not present

## 2015-10-19 NOTE — Telephone Encounter (Signed)
Called [place] and left message for Dollene Cleveland giving him a verbal order for patients OPT

## 2015-10-20 DIAGNOSIS — R2689 Other abnormalities of gait and mobility: Secondary | ICD-10-CM | POA: Diagnosis not present

## 2015-10-20 DIAGNOSIS — E1122 Type 2 diabetes mellitus with diabetic chronic kidney disease: Secondary | ICD-10-CM | POA: Diagnosis not present

## 2015-10-21 ENCOUNTER — Ambulatory Visit (INDEPENDENT_AMBULATORY_CARE_PROVIDER_SITE_OTHER): Payer: Medicare Other | Admitting: Internal Medicine

## 2015-10-21 ENCOUNTER — Encounter: Payer: Self-pay | Admitting: Internal Medicine

## 2015-10-21 VITALS — BP 120/64 | HR 60 | Temp 97.7°F | Ht 68.0 in | Wt 147.0 lb

## 2015-10-21 DIAGNOSIS — K4090 Unilateral inguinal hernia, without obstruction or gangrene, not specified as recurrent: Secondary | ICD-10-CM | POA: Diagnosis not present

## 2015-10-21 DIAGNOSIS — J690 Pneumonitis due to inhalation of food and vomit: Secondary | ICD-10-CM

## 2015-10-21 DIAGNOSIS — E118 Type 2 diabetes mellitus with unspecified complications: Secondary | ICD-10-CM | POA: Diagnosis not present

## 2015-10-21 DIAGNOSIS — N183 Chronic kidney disease, stage 3 unspecified: Secondary | ICD-10-CM

## 2015-10-21 DIAGNOSIS — I5042 Chronic combined systolic (congestive) and diastolic (congestive) heart failure: Secondary | ICD-10-CM

## 2015-10-21 DIAGNOSIS — I2510 Atherosclerotic heart disease of native coronary artery without angina pectoris: Secondary | ICD-10-CM

## 2015-10-21 DIAGNOSIS — I10 Essential (primary) hypertension: Secondary | ICD-10-CM

## 2015-10-21 MED ORDER — FREESTYLE LANCETS MISC: Status: DC

## 2015-10-21 MED ORDER — GLUCOSE BLOOD VI STRP: ORAL_STRIP | Status: DC

## 2015-10-21 NOTE — Progress Notes (Signed)
Patient ID: [name] [name], male   DOB: 04/18/1926, 81 y.o.   MRN: [id number]   Location: [place] Provider: Rexene Edison. Mariea Clonts, D.O., C.M.D.  Code Status: DNR Goals of Care: Advanced Directive information Does patient have an advance directive?: Yes, Type of Advance Directive: Living will, Pre-existing out of facility DNR order (yellow form or pink MOST form): Pink MOST form placed in chart (order not valid for inpatient use)  Chief Complaint  Patient presents with  . Follow-up    Follow-up from SNF discharge [facility]hab. Here with wife  . Hospitalization Follow-up    Seen in [facility] 09/20/15, + Fall Risk     HPI: Patient is a 81 y.o. male seen in the office today for follow-up after he was hospitalized for hernia surgery.  When he came out of the anesthetic, he vomited and had to be changed.  His wife thinks maybe he aspirated during that time.  He was not helped to stand or get up until he was ready to leave per his wife.  His wife got him in the wheelchair and the nurse came and asked him if he was in pain.  He said yes and was given two pain pills.  He was then discharged home that day.  His wife drove him home.   [address] in parking lot outside their door after he was very dizzy from 2 pain pills.  EMS came and picked him back up.  He was then home 4 days, then got chest pain and shortness of breath.  His wife had called the chf clinic when he was doing  poorly and he'd gained 7 lbs.  He got two extra doses of lasix and his weight went down.  She called ems and he went back to the hospital.  He had pneumonia and severe anemia requiring blood transfusion.  Then he went to [facility] for rehab for three weeks. He completed the augmentin therapy for his aspiration pneumonia.  He no longer needs robitussin and hiccups resolved and thorazine was stopped.  He was temporarily on a modified diet at [facility], but it was advanced by discharge and now he's on a regular consistency heart  healthy diabetic diet.  His wife has not noticed any coughing or choking during meals and pt also denies any difficulties with this since returning home.  Was discharged last fri, 10/14/15 from [facility].  Has home health and doing much better since returning home a week ago.  He's using his walker which has been helpful.  He remains wobbly, especially on that right leg.  Using walker as directed.  [facility] nurse came in and wants his wife to check his blood sugar regularly.   hba1c was 7.7 09/20/15 at the hospital.  He has lost 5 lbs during all of this.  Is still doing his daily weights for his chf.  They have been stable since returning home.  His wife has been unable to check his glucose, but says she can help him with this and strips and lancets were ordered.  He has PT, OT, and ST at home.  He is gradually improving.  His wife notes he's so much better since being home.  He has chronic dyspnea on exertion.    Review of Systems:  Review of Systems  Constitutional: Positive for weight loss. Negative for fever, chills and malaise/fatigue.  HENT: Negative for congestion and hearing loss.   Eyes: Negative for blurred vision.  Glasses  Respiratory: Negative for shortness of breath.   Cardiovascular: Negative for chest pain, palpitations and leg swelling.  Gastrointestinal: Positive for constipation. Negative for abdominal pain, blood in stool and melena.       Better now  Genitourinary: Negative for dysuria.  Musculoskeletal: Positive for falls.       Using walker  Skin: Negative for rash.  Neurological: Positive for weakness. Negative for dizziness and loss of consciousness.  Psychiatric/Behavioral: Positive for memory loss. Negative for depression.    Past Medical History  Diagnosis Date  . History of colon cancer     s/p colon resection  . Aortic stenosis     a. s/p AVR with 52mm Edwards pericardial valve 02/07/12 - post-op course complicated by pleural  effusion requring thoracentesis/leg cellulitis 03/2012.  Marland Kitchen CAD (coronary artery disease)     a. s/p NSTEMI 12/2011:  LHC - Ostial left main 20%, ostial LAD 50%, mid 60-70%, ostial D1 40% and mid 40%, D2 70%, ostial circumflex occluded, ostial RCA 80-90%, LVEDP was 42. b.  s/p CABG x 3 at time of AVR (LiMA-LAD, SVG-2nd daigonal, SVG-PDA) 02/07/12 (post-op course noted above).  . Ischemic cardiomyopathy   . Chronic systolic heart failure ([place])     a. TEE 01/2012: EF 25-30%, diffuse hypokinesis. b. Not on ACEI due to renal insufficiency.;  c. follow up  echo 08/06/12: EF 25%, mod diast dysfxn, AVR ok, mild MR, mod LAE, mild RAE, mild to mod RV systolic dysfunction  . HLD (hyperlipidemia)   . GERD (gastroesophageal reflux disease)   . Chronic kidney disease   . Pleural effusion     a. post-operatively after AVR/CABG s/p thoracentesis 03/2012 yielding 1L serosanguinous fluid.  . Diabetes mellitus     borderline  . Blood transfusion     NO REACTION TO TRANSFUSION  . Cellulitis     a. RLE cellulitis 2 months post-operatively after AVR/CABG - serratia marcessans, tx with I&D/antibiotics  . Mitral regurgitation     Moderate by TEE 01/2012  . Flash pulmonary edema ([place])     Post-cath 12/2011, went into acute pulm edema requiring IV lasix and intubation  . Baker's cyst 07/23/12    Incidental finding of LE venous dopplers  . Cataract     right eye, hx of  . HTN (hypertension)     primary, Dr. Hollace Kinnier  . Myocardial infarction ([place]) 12/2011  . CHF (congestive heart failure) ([place])   . Stroke ([place]) 10/01    RIght Leg weakness  . Cancer [facility]) '90's    Colon  . Anemia   . Anxiety     Past Surgical History  Procedure Laterality Date  . Colon resection  1996  . Esophageal dilation    . Colonoscopy    . Cataract extraction      rt  . Cardiac catheterization      1.3.13  stopped breathing, put on ventilator for 5-6 days  . Tonsillectomy    . Aortic valve replacement  02/07/2012    Procedure:  AORTIC VALVE REPLACEMENT (AVR);  Surgeon: Gaye Pollack, MD;  Location: [place];  Service: Open Heart Surgery;  Laterality: N/A;  . Coronary artery bypass graft  02/07/2012    Procedure: CORONARY ARTERY BYPASS GRAFTING (CABG);  Surgeon: Gaye Pollack, MD;  Location: [place];  Service: Open Heart Surgery;  Laterality: N/A;  CABG x three; using right leg greater saphenous vein harvested endoscopically  . Chest tube insertion  07/24/2012    Procedure:  INSERTION PLEURAL DRAINAGE CATHETER;  Surgeon: Gaye Pollack, MD;  Location: [place];  Service: Thoracic;  Laterality: Left;  . Talc pleurodesis  08/30/2012    Procedure: TALC PLEURADESIS;  Surgeon: Gaye Pollack, MD;  Location: [place];  Service: Thoracic;  Laterality: Left;  INSERTION OF TALC VIA LEFT PLEURX  . Talc pleurodesis  09/12/2012    Procedure: TALC PLEURADESIS;  Surgeon: Gaye Pollack, MD;  Location: [place];  Service: Thoracic;  Laterality: Left;  . Removal of pleural drainage catheter  09/27/2012    Procedure: REMOVAL OF PLEURAL DRAINAGE CATHETER;  Surgeon: Gaye Pollack, MD;  Location: [place];  Service: Thoracic;  Laterality: Left;  Marland Kitchen Eye surgery  2009    cataract removed from right eye  . Esophagogastroduodenoscopy N/A 07/03/2014    Procedure: ESOPHAGOGASTRODUODENOSCOPY (EGD);  Surgeon: Arta Silence, MD;  Location[facility] ENDOSCOPY;  Service: Endoscopy;  Laterality: N/A;  . Esophagoscopy with dilitation N/A 07/07/2014    Procedure: ESOPHAGOSCOPY WITH DILITATION/SAVARY DILATOR;  Surgeon: Rozetta Nunnery, MD;  Location: [place];  Service: ENT;  Laterality: N/A;  . Ercp N/A 07/08/2014    Procedure: ENDOSCOPIC RETROGRADE CHOLANGIOPANCREATOGRAPHY (ERCP);  Surgeon: Missy Sabins, MD;  Location: [facility] ENDOSCOPY;  Service: Endoscopy;  Laterality: N/A;  . Left heart catheterization with coronary angiogram N/A 12/13/2011    Procedure: LEFT HEART CATHETERIZATION WITH CORONARY ANGIOGRAM;  Surgeon: Peter M Martinique, MD;  Location: [facility] CATH LAB;  Service:  Cardiovascular;  Laterality: N/A;  . Inguinal hernia repair Right 09/13/2015    Procedure: OPEN RIGHT INGUINAL HERNIA;  Surgeon: Mickeal Skinner, MD;  Location: [place];  Service: General;  Laterality: Right;  . Insertion of mesh Right 09/13/2015    Procedure: INSERTION OF MESH;  Surgeon: Mickeal Skinner, MD;  Location: [place];  Service: General;  Laterality: Right;    Allergies  Allergen Reactions  . Statins Other (See Comments)    Pain/weakness in legs      Medication List       This list is accurate as of: 10/21/15 11:59 PM.  Always use your most recent med list.               aspirin EC 325 MG tablet  Take 325 mg by mouth daily.     carvedilol 25 MG tablet  Commonly known as:  COREG  TAKE 1 TABLET BY MOUTH TWICE A DAY WITH A MEAL     cholecalciferol 1000 UNITS tablet  Commonly known as:  VITAMIN D  Take 1,000 Units by mouth daily with supper.     freestyle lancets  Use as instructed     furosemide 40 MG tablet  Commonly known as:  LASIX  Take 2 tablets (80 mg total) by mouth daily.     glimepiride 1 MG tablet  Commonly known as:  AMARYL  Take 1 tablet (1 mg total) by mouth daily with breakfast.     glucose blood test strip  Commonly known as:  FREESTYLE LITE  Use as instructed     hydrALAZINE 25 MG tablet  Commonly known as:  APRESOLINE  TAKE 1 TABLET BY MOUTH 3 TIMES A DAY     isosorbide mononitrate 60 MG 24 hr tablet  Commonly known as:  IMDUR  TAKE 1 TABLET BY MOUTH EVERY DAY     multivitamin with minerals Tabs tablet  Take 1 tablet by mouth daily.     nitroGLYCERIN 0.4 MG SL tablet  Commonly known as:  NITROSTAT  Place 0.4 mg under the tongue every 5 (five) minutes as needed for chest pain.     omeprazole 20 MG capsule  Commonly known as:  PRILOSEC  Take 20 mg by mouth daily.     PRENATAL 1 PO  Take by mouth daily.     tamsulosin 0.4 MG Caps capsule  Commonly known as:  FLOMAX  TAKE 1 CAPSULE EVERY DAY AFTER DINNER     Travoprost  (BAK Free) 0.004 % Soln ophthalmic solution  Commonly known as:  TRAVATAN  Place 1 drop into both eyes daily.        Health Maintenance  Topic Date Due  . OPHTHALMOLOGY EXAM  01/28/1936  . PNA vac Low Risk Adult (2 of 2 - PCV13) 07/30/2014  . FOOT EXAM  02/15/2016  . HEMOGLOBIN A1C  03/20/2016  . INFLUENZA VACCINE  07/11/2016  . URINE MICROALBUMIN  10/20/2016  . TETANUS/TDAP  08/25/2025  . ZOSTAVAX  Completed    Physical Exam: Filed Vitals:   10/21/15 1315  BP: 120/64  Pulse: 60  Temp: 97.7 F (36.5 C)  TempSrc: Oral  Height: 5\' 8"  (1.727 m)  Weight: 147 lb (66.679 kg)  SpO2: 97%   Body mass index is 22.36 kg/(m^2). Physical Exam  Constitutional: He is oriented to person, place, and time. He appears well-developed and well-nourished.  HENT:  Head: Normocephalic and atraumatic.  Eyes:  glasses  Neck: No JVD present.  Cardiovascular: Normal rate, regular rhythm, normal heart sounds and intact distal pulses.   Pulmonary/Chest: Effort normal and breath sounds normal. No respiratory distress.  Abdominal: Soft. Bowel sounds are normal. He exhibits no distension. There is no tenderness.  Musculoskeletal: Normal range of motion.  Walks with walker now, slowly with slight dragging of right leg  Neurological: He is alert and oriented to person, place, and time.  Skin: Skin is warm and dry.  Psychiatric: He has a normal mood and affect.    Labs reviewed: Basic Metabolic Panel:  Recent Labs  09/20/15 1550  10/06/15 1630 10/22/15 2200 10/22/15 2207 10/23/15 0230  NA  --   < > 140 136 138 138  K  --   < > 4.5 3.9 3.9 3.6  CL  --   < > 103 100* 100* 101  CO2  --   < > 26 25  --  26  GLUCOSE  --   < > 128* 183* 184* 102*  BUN  --   < > 37* 48* 49* 48*  CREATININE  --   < > 2.05* 2.68* 2.80* 2.63*  CALCIUM  --   < > 9.0 8.9  --  9.0  MG  --   --   --  2.3  --   --   PHOS 2.7  --   --   --   --   --   < > = values in this interval not displayed. Liver Function  Tests:  Recent Labs  02/15/15 1539 09/21/15 0401 10/22/15 2200  AST 15 26 27   ALT 11 19 16*  ALKPHOS 66 65 54  BILITOT 0.4 1.6* 0.7  PROT 6.8 6.1* 6.6  ALBUMIN 4.0 2.4* 3.0*   No results for input(s): LIPASE, AMYLASE in the last 8760 hours. No results for input(s): AMMONIA in the last 8760 hours. CBC:  Recent Labs  08/11/15 1326  09/20/15 0546  09/23/15 0429 10/06/15 1630 10/22/15 2207 10/23/15 0230  WBC 6.1  < > 8.6  < > 8.0 6.0  --  5.3  NEUTROABS 3.4  --  6.8  --  6.3  --   --   --   HGB 12.2*  < > 7.4*  < > 9.6* 9.5* 9.5* 9.1*  HCT 36.8*  < > 23.3*  < > 29.8* 29.4* 28.0* 28.2*  MCV 98.4*  < > 101.3*  < > 99.3 99.3  --  98.9  PLT 135*  < > 180  < > 195 248  --  143*  < > = values in this interval not displayed. Lipid Panel: No results for input(s): CHOL, HDL, LDLCALC, TRIG, CHOLHDL, LDLDIRECT in the last 8760 hours. Lab Results  Component Value Date   HGBA1C 7.7* 09/20/2015    Procedures since last visit: 10/10 CXR and 10/11 swallow study reviewed  Assessment/Plan 1. Controlled type 2 diabetes mellitus with complication, without long-term current use of insulin (HCC) - has been well-controlled--most recent hba1c was elevated vs. His baseline for some time--suspect it was altered by changed diet in hospital and snf -cont amaryl and dietary changes, exercise with PT, OT - Hemoglobin A1c; Future - Microalbumin/Creatinine Ratio, Urine  2. Chronic combined systolic and diastolic heart failure, NYHA class 2 (HCC) -has had exacerbation while he was hospitalized with aspiration pneumonia -has stabilized -continues with chronic dyspnea on exertion -cont coreg, asa, lasix, hydralazine, nitrate -[facility] f[facility] per his wife  3. Essential hypertension -bp at goal with current regimen, no changes, no lightheadedness  4. Aspiration pneumonia, unspecified aspiration pneumonia type -completed augmentin therapy -felt to have occurred when he was coming  out of anesthesia from his hernia repair  69. Unilateral inguinal hernia without obstruction or gangrene, recurrence not specified -s/p successful repair--he had been adamant about having this done for his quality of life b/c he could hardly walk, the hernia was so large  6. Chronic kidney disease, stage III (moderate): Cr ~2.2 baseline - f/u before next visit as it was recently checked at [facility] - CBC with Differential/Platelet; Future - Basic metabolic panel; Future - Microalbumin/Creatinine Ratio, Urine  7. Atherosclerosis of native coronary artery of native heart without angina pectoris -cont hydralazine and nitrate, prn ntg, coreg  Labs/tests ordered:   Orders Placed This Encounter  Procedures  . CBC with Differential/Platelet    Standing Status: Future     Number of Occurrences:      Standing Expiration Date: 02/18/2016  . Basic metabolic panel    Standing Status: Future     Number of Occurrences:      Standing Expiration Date: 02/18/2016  . Hemoglobin A1c    Standing Status: Future     Number of Occurrences:      Standing Expiration Date: 02/18/2016  . Microalbumin/Creatinine Ratio, Urine    Next appt:  2 mos with labs before  [name] [name] L. Kyuss Hale, D.O. [facility] [address], [place] [address] Cell Phone (Mon-Fri 8am-5pm):  [phone] On Call:  [phone] & follow prompts after 5pm & weekends Office Phone:  [phone] Office Fax:  [phone]

## 2015-10-22 ENCOUNTER — Emergency Department (HOSPITAL_COMMUNITY): Payer: Medicare Other

## 2015-10-22 ENCOUNTER — Encounter (HOSPITAL_COMMUNITY): Payer: Self-pay | Admitting: Emergency Medicine

## 2015-10-22 ENCOUNTER — Inpatient Hospital Stay (HOSPITAL_COMMUNITY)
Admission: EM | Admit: 2015-10-22 | Discharge: 2015-10-26 | DRG: 194 | Disposition: A | Discharge status: Home / Self Care | Payer: Medicare Other | Attending: Internal Medicine | Admitting: Internal Medicine

## 2015-10-22 ENCOUNTER — Other Ambulatory Visit: Payer: Self-pay | Admitting: *Deleted

## 2015-10-22 DIAGNOSIS — E785 Hyperlipidemia, unspecified: Secondary | ICD-10-CM | POA: Diagnosis present

## 2015-10-22 DIAGNOSIS — I5042 Chronic combined systolic (congestive) and diastolic (congestive) heart failure: Secondary | ICD-10-CM | POA: Diagnosis present

## 2015-10-22 DIAGNOSIS — E118 Type 2 diabetes mellitus with unspecified complications: Secondary | ICD-10-CM | POA: Diagnosis present

## 2015-10-22 DIAGNOSIS — J189 Pneumonia, unspecified organism: Principal | ICD-10-CM | POA: Diagnosis present

## 2015-10-22 DIAGNOSIS — Z8249 Family history of ischemic heart disease and other diseases of the circulatory system: Secondary | ICD-10-CM

## 2015-10-22 DIAGNOSIS — N183 Chronic kidney disease, stage 3 unspecified: Secondary | ICD-10-CM | POA: Diagnosis present

## 2015-10-22 DIAGNOSIS — R0781 Pleurodynia: Secondary | ICD-10-CM | POA: Diagnosis present

## 2015-10-22 DIAGNOSIS — I252 Old myocardial infarction: Secondary | ICD-10-CM

## 2015-10-22 DIAGNOSIS — D631 Anemia in chronic kidney disease: Secondary | ICD-10-CM | POA: Diagnosis present

## 2015-10-22 DIAGNOSIS — K219 Gastro-esophageal reflux disease without esophagitis: Secondary | ICD-10-CM | POA: Diagnosis present

## 2015-10-22 DIAGNOSIS — N189 Chronic kidney disease, unspecified: Secondary | ICD-10-CM

## 2015-10-22 DIAGNOSIS — Y95 Nosocomial condition: Secondary | ICD-10-CM | POA: Diagnosis present

## 2015-10-22 DIAGNOSIS — I129 Hypertensive chronic kidney disease with stage 1 through stage 4 chronic kidney disease, or unspecified chronic kidney disease: Secondary | ICD-10-CM | POA: Diagnosis present

## 2015-10-22 DIAGNOSIS — Z952 Presence of prosthetic heart valve: Secondary | ICD-10-CM

## 2015-10-22 DIAGNOSIS — R918 Other nonspecific abnormal finding of lung field: Secondary | ICD-10-CM | POA: Diagnosis not present

## 2015-10-22 DIAGNOSIS — Z809 Family history of malignant neoplasm, unspecified: Secondary | ICD-10-CM

## 2015-10-22 DIAGNOSIS — E1122 Type 2 diabetes mellitus with diabetic chronic kidney disease: Secondary | ICD-10-CM | POA: Diagnosis not present

## 2015-10-22 DIAGNOSIS — I119 Hypertensive heart disease without heart failure: Secondary | ICD-10-CM | POA: Diagnosis present

## 2015-10-22 DIAGNOSIS — R2689 Other abnormalities of gait and mobility: Secondary | ICD-10-CM | POA: Diagnosis not present

## 2015-10-22 DIAGNOSIS — I255 Ischemic cardiomyopathy: Secondary | ICD-10-CM | POA: Diagnosis present

## 2015-10-22 DIAGNOSIS — Z85038 Personal history of other malignant neoplasm of large intestine: Secondary | ICD-10-CM

## 2015-10-22 DIAGNOSIS — R0789 Other chest pain: Secondary | ICD-10-CM | POA: Diagnosis not present

## 2015-10-22 DIAGNOSIS — F419 Anxiety disorder, unspecified: Secondary | ICD-10-CM | POA: Diagnosis not present

## 2015-10-22 DIAGNOSIS — Z951 Presence of aortocoronary bypass graft: Secondary | ICD-10-CM

## 2015-10-22 DIAGNOSIS — I2510 Atherosclerotic heart disease of native coronary artery without angina pectoris: Secondary | ICD-10-CM | POA: Diagnosis present

## 2015-10-22 DIAGNOSIS — R030 Elevated blood-pressure reading, without diagnosis of hypertension: Secondary | ICD-10-CM | POA: Diagnosis not present

## 2015-10-22 DIAGNOSIS — Z8673 Personal history of transient ischemic attack (TIA), and cerebral infarction without residual deficits: Secondary | ICD-10-CM

## 2015-10-22 HISTORY — DX: Anxiety disorder, unspecified: F41.9

## 2015-10-22 LAB — URINALYSIS, ROUTINE W REFLEX MICROSCOPIC
Bilirubin Urine: NEGATIVE
GLUCOSE, UA: NEGATIVE mg/dL
Hgb urine dipstick: NEGATIVE
KETONES UR: NEGATIVE mg/dL
LEUKOCYTES UA: NEGATIVE
NITRITE: NEGATIVE
PH: 5.5 (ref 5.0–8.0)
Protein, ur: NEGATIVE mg/dL
SPECIFIC GRAVITY, URINE: 1.016 (ref 1.005–1.030)
Urobilinogen, UA: 0.2 mg/dL (ref 0.0–1.0)

## 2015-10-22 LAB — COMPREHENSIVE METABOLIC PANEL
ALBUMIN: 3 g/dL — AB (ref 3.5–5.0)
ALK PHOS: 54 U/L (ref 38–126)
ALT: 16 U/L — ABNORMAL LOW (ref 17–63)
AST: 27 U/L (ref 15–41)
Anion gap: 11 (ref 5–15)
BILIRUBIN TOTAL: 0.7 mg/dL (ref 0.3–1.2)
BUN: 48 mg/dL — AB (ref 6–20)
CALCIUM: 8.9 mg/dL (ref 8.9–10.3)
CO2: 25 mmol/L (ref 22–32)
CREATININE: 2.68 mg/dL — AB (ref 0.61–1.24)
Chloride: 100 mmol/L — ABNORMAL LOW (ref 101–111)
GFR calc Af Amer: 23 mL/min — ABNORMAL LOW (ref >60)
GFR, EST NON AFRICAN AMERICAN: 20 mL/min — AB (ref >60)
GLUCOSE: 183 mg/dL — AB (ref 65–99)
Potassium: 3.9 mmol/L (ref 3.5–5.1)
Sodium: 136 mmol/L (ref 135–145)
TOTAL PROTEIN: 6.6 g/dL (ref 6.5–8.1)

## 2015-10-22 LAB — I-STAT CHEM 8, ED
BUN: 49 mg/dL — AB (ref 6–20)
CHLORIDE: 100 mmol/L — AB (ref 101–111)
CREATININE: 2.8 mg/dL — AB (ref 0.61–1.24)
Calcium, Ion: 1.12 mmol/L — ABNORMAL LOW (ref 1.13–1.30)
Glucose, Bld: 184 mg/dL — ABNORMAL HIGH (ref 65–99)
HEMATOCRIT: 28 % — AB (ref 39.0–52.0)
Hemoglobin: 9.5 g/dL — ABNORMAL LOW (ref 13.0–17.0)
Potassium: 3.9 mmol/L (ref 3.5–5.1)
SODIUM: 138 mmol/L (ref 135–145)
TCO2: 26 mmol/L (ref 0–100)

## 2015-10-22 LAB — MICROALBUMIN / CREATININE URINE RATIO
Creatinine, Urine: 33 mg/dL
MICROALB/CREAT RATIO: 9.1 mg/g creat (ref 0.0–30.0)
Microalbumin, Urine: 3 ug/mL

## 2015-10-22 LAB — I-STAT TROPONIN, ED: Troponin i, poc: 0.02 ng/mL (ref 0.00–0.08)

## 2015-10-22 LAB — BRAIN NATRIURETIC PEPTIDE: B NATRIURETIC PEPTIDE 5: 614 pg/mL — AB (ref 0.0–100.0)

## 2015-10-22 LAB — MAGNESIUM: Magnesium: 2.3 mg/dL (ref 1.7–2.4)

## 2015-10-22 MED ORDER — PIPERACILLIN-TAZOBACTAM 3.375 G IVPB 30 MIN
3.3750 g | Freq: Once | INTRAVENOUS | Status: AC
  Administered 2015-10-22: 3.375 g via INTRAVENOUS
  Filled 2015-10-22: qty 50

## 2015-10-22 MED ORDER — GLUCOSE BLOOD VI STRP: ORAL_STRIP | Status: DC

## 2015-10-22 MED ORDER — ASPIRIN EC 325 MG PO TBEC: 325.0000 mg | DELAYED_RELEASE_TABLET | Freq: Every day | ORAL | Status: DC

## 2015-10-22 MED ORDER — FREESTYLE LANCETS MISC: Status: DC

## 2015-10-22 MED ORDER — VANCOMYCIN HCL 10 G IV SOLR
1250.0000 mg | Freq: Once | INTRAVENOUS | Status: AC
  Administered 2015-10-22: 1250 mg via INTRAVENOUS
  Filled 2015-10-22: qty 1250

## 2015-10-22 NOTE — ED Notes (Signed)
Pt moved from TRA B to D35. Family and portable x-ray bedside.

## 2015-10-22 NOTE — ED Notes (Addendum)
Pt to ED via [facility] with report of chest pain.  EMS reports pt had 3 different therapies today at home including physical therapy with upper body work out.  Pt c/o upper chest pain, worse with deep breathing or putting his arms above his head.  Pt took NTG SL x's 1 at home and 1 ASA prior to EMS arrival  EMS reports pt hyperventilating on their arrival

## 2015-10-22 NOTE — Telephone Encounter (Signed)
[facility] 

## 2015-10-22 NOTE — ED Provider Notes (Signed)
Arrival Date & Time: 10/22/15 & 2051 History   Chief Complaint  Patient presents with  . Chest Pain   HPI [name] [name] is a 81 y.o. male who presents to the emergency department via EMS with endorsement of chest pain. Patient was at physical rehabilitation and performing therapy when patient had upper chest pain worse with deep breathing or range of movement of arms. Patient took nitroglycerin sublingual 1 and one aspirin 81 mg prior to EMS arrival. Chest pain began around 7 PM. Still present at this time. Patient endorses recent umbilical hernia repair on 09/13/2015 with subsequent development of pneumonia following procedure. Patient endorses symptoms are similar to pneumonia symptoms.patient has had cough times one day that is productive and associated with mild shortness of breath denies abdominal pain or back pain headache leg pain or urinary symptoms at this time.  Past Medical History  I reviewed & agree with nursing's documentation of PMHx, PSHx, SHx & FHx. Past Medical History  Diagnosis Date  . History of colon cancer     s/p colon resection  . Aortic stenosis     a. s/p AVR with 8mm Edwards pericardial valve 02/07/12 - post-op course complicated by pleural effusion requring thoracentesis/leg cellulitis 03/2012.  Marland Kitchen CAD (coronary artery disease)     a. s/p NSTEMI 12/2011:  LHC - Ostial left main 20%, ostial LAD 50%, mid 60-70%, ostial D1 40% and mid 40%, D2 70%, ostial circumflex occluded, ostial RCA 80-90%, LVEDP was 42. b.  s/p CABG x 3 at time of AVR (LiMA-LAD, SVG-2nd daigonal, SVG-PDA) 02/07/12 (post-op course noted above).  . Ischemic cardiomyopathy   . Chronic systolic heart failure ([place])     a. TEE 01/2012: EF 25-30%, diffuse hypokinesis. b. Not on ACEI due to renal insufficiency.;  c. follow up  echo 08/06/12: EF 25%, mod diast dysfxn, AVR ok, mild MR, mod LAE, mild RAE, mild to mod RV systolic dysfunction  . HLD (hyperlipidemia)   . GERD (gastroesophageal reflux disease)    . Chronic kidney disease   . Pleural effusion     a. post-operatively after AVR/CABG s/p thoracentesis 03/2012 yielding 1L serosanguinous fluid.  . Diabetes mellitus     borderline  . Blood transfusion     NO REACTION TO TRANSFUSION  . Cellulitis     a. RLE cellulitis 2 months post-operatively after AVR/CABG - serratia marcessans, tx with I&D/antibiotics  . Mitral regurgitation     Moderate by TEE 01/2012  . Flash pulmonary edema ([place])     Post-cath 12/2011, went into acute pulm edema requiring IV lasix and intubation  . Baker's cyst 07/23/12    Incidental finding of LE venous dopplers  . Cataract     right eye, hx of  . HTN (hypertension)     primary, Dr. Hollace Kinnier  . Myocardial infarction ([place]) 12/2011  . CHF (congestive heart failure) ([place])   . Stroke ([place]) 10/01    RIght Leg weakness  . Cancer [facility]) '90's    Colon  . Anemia   . Anxiety    Past Surgical History  Procedure Laterality Date  . Colon resection  1996  . Esophageal dilation    . Colonoscopy    . Cataract extraction      rt  . Cardiac catheterization      1.3.13  stopped breathing, put on ventilator for 5-6 days  . Tonsillectomy    . Aortic valve replacement  02/07/2012    Procedure: AORTIC VALVE REPLACEMENT (AVR);  Surgeon:  Gaye Pollack, MD;  Location: [place];  Service: Open Heart Surgery;  Laterality: N/A;  . Coronary artery bypass graft  02/07/2012    Procedure: CORONARY ARTERY BYPASS GRAFTING (CABG);  Surgeon: Gaye Pollack, MD;  Location: [place];  Service: Open Heart Surgery;  Laterality: N/A;  CABG x three; using right leg greater saphenous vein harvested endoscopically  . Chest tube insertion  07/24/2012    Procedure: INSERTION PLEURAL DRAINAGE CATHETER;  Surgeon: Gaye Pollack, MD;  Location: [place];  Service: Thoracic;  Laterality: Left;  . Talc pleurodesis  08/30/2012    Procedure: TALC PLEURADESIS;  Surgeon: Gaye Pollack, MD;  Location: [place];  Service: Thoracic;  Laterality: Left;  INSERTION OF  TALC VIA LEFT PLEURX  . Talc pleurodesis  09/12/2012    Procedure: TALC PLEURADESIS;  Surgeon: Gaye Pollack, MD;  Location: [place];  Service: Thoracic;  Laterality: Left;  . Removal of pleural drainage catheter  09/27/2012    Procedure: REMOVAL OF PLEURAL DRAINAGE CATHETER;  Surgeon: Gaye Pollack, MD;  Location: [place];  Service: Thoracic;  Laterality: Left;  Marland Kitchen Eye surgery  2009    cataract removed from right eye  . Esophagogastroduodenoscopy N/A 07/03/2014    Procedure: ESOPHAGOGASTRODUODENOSCOPY (EGD);  Surgeon: Arta Silence, MD;  Location: [facility] ENDOSCOPY;  Service: Endoscopy;  Laterality: N/A;  . Esophagoscopy with dilitation N/A 07/07/2014    Procedure: ESOPHAGOSCOPY WITH DILITATION/SAVARY DILATOR;  Surgeon: Rozetta Nunnery, MD;  Location: [place];  Service: ENT;  Laterality: N/A;  . Ercp N/A 07/08/2014    Procedure: ENDOSCOPIC RETROGRADE CHOLANGIOPANCREATOGRAPHY (ERCP);  Surgeon: Missy Sabins, MD;  Location: [facility] END[facility]COPY;  Service: Endoscopy;  Laterality: N/A;  . Left heart catheterization with coronary angiogram N/A 12/13/2011    Procedure: LEFT HEART CATHETERIZATION WITH CORONARY ANGIOGRAM;  Surgeon: Peter M Martinique, MD;  Location: [facility] CATH LAB;  Service: Cardiovascular;  Laterality: N/A;  . Inguinal hernia repair Right 09/13/2015    Procedure: OPEN RIGHT INGUINAL HERNIA;  Surgeon: Mickeal Skinner, MD;  Location: [place];  Service: General;  Laterality: Right;  . Insertion of mesh Right 09/13/2015    Procedure: INSERTION OF MESH;  Surgeon: Mickeal Skinner, MD;  Location: [place];  Service: General;  Laterality: Right;   Social History   Social History  . Marital Status: Married    Spouse Name: N/A  . Number of Children: N/A  . Years of Education: N/A   Social History Main Topics  . Smoking status: Never Smoker   . Smokeless tobacco: Never Used  . Alcohol Use: No  . Drug Use: No  . Sexual Activity: Not Currently   Other Topics Concern  . None   Social History Narrative    Family History  Problem Relation Age of Onset  . Cancer Mother   . Heart attack Father   . Mental illness Brother   . Kidney failure Brother     Review of Systems   Complete Review of Systems obtained and is negative except as stated in HPI.  Allergies  Statins  Home Medications   Prior to Admission medications   Medication Sig Start Date End Date Taking? Authorizing Provider  aspirin EC 325 MG tablet Take 325 mg by mouth daily.    Yes Historical Provider, MD  carvedilol (COREG) 25 MG tablet TAKE 1 TABLET BY MOUTH TWICE A DAY WITH A MEAL 08/03/15  Yes Jolaine Artist, MD  chlorproMAZINE (THORAZINE) 10 MG tablet Take 10 mg  by mouth 3 (three) times daily.   Yes Historical Provider, MD  cholecalciferol (VITAMIN D) 1000 UNITS tablet Take 1,000 Units by mouth daily with supper.    Yes Historical Provider, MD  furosemide (LASIX) 40 MG tablet Take 2 tablets (80 mg total) by mouth daily. 02/08/15  Yes Jolaine Artist, MD  glimepiride (AMARYL) 1 MG tablet Take 1 tablet (1 mg total) by mouth daily with breakfast. 09/23/15  Yes Nita Sells, MD  hydrALAZINE (APRESOLINE) 25 MG tablet TAKE 1 TABLET BY MOUTH 3 TIMES A DAY 07/12/15  Yes [name] L [name], DO  isosorbide mononitrate (IMDUR) 60 MG 24 hr tablet TAKE 1 TABLET BY MOUTH EVERY DAY 05/04/15  Yes Jolaine Artist, MD  Lancets (FREESTYLE) lancets Use to check blood sugar three times daily. Dx: E11.8 10/22/15  Yes [name] L [name], DO  Multiple Vitamin (MULTIVITAMIN WITH MINERALS) TABS tablet Take 1 tablet by mouth daily.   Yes Historical Provider, MD  nitroGLYCERIN (NITROSTAT) 0.4 MG SL tablet Place 0.4 mg under the tongue every 5 (five) minutes as needed for chest pain.   Yes Historical Provider, MD  omeprazole (PRILOSEC) 20 MG capsule Take 20 mg by mouth daily.    Yes Historical Provider, MD  tamsulosin (FLOMAX) 0.4 MG CAPS capsule TAKE 1 CAPSULE EVERY DAY AFTER DINNER 10/04/15  Yes [name] L [name], DO  Travoprost, BAK Free,  (TRAVATAN) 0.004 % SOLN ophthalmic solution Place 1 drop into both eyes daily.    Yes Historical Provider, MD  levofloxacin (LEVAQUIN) 500 MG tablet Take 1 tablet (500 mg total) by mouth every other day. 10/26/15   Charlynne Cousins, MD    Physical Exam  BP 126/56 mmHg  Pulse 56  Temp(Src) 98.1 F (36.7 C) (Oral)  Resp 18  Ht 5\' 8"  (1.727 m)  Wt 144 lb 3.8 oz (65.427 kg)  BMI 21.94 kg/m2  SpO2 97% Physical Exam Vitals & Nursing notes reviewed. CONST: male, in no acute distress. Appears WD/WN & stated age. HEAD: [place]/AT. EYES: PERRL. No conjunctival injection & lids symmetrical. ENMT: External nose & ears atraumatic. MM moist.  Oropharynx w/o swelling or exudates. NECK: Supple, w/o meningismus. Trachea midline w/o JVD. Stridor absent. CVS: S1/S2 audible w/o gallops. Murmur absent. Peripheral pulses 2+ & equal in all extremities. Cap refill < 2 seconds. RESP: Respiratory effort normal. Lungs coarse bilaterally with rhonchi, w/o wheeze. GI: Soft, w/o TTP. Guarding & rebound absent. BS normal. BACK: W/o CVA TTP bilaterally. SKIN: Warm & dry, w/o rash. W/o open wound. NEURO: AAOx3. CN II-XIII grossly intact.  Sensation w/o deficit. Strength w/o deficit. Tremor absent. PSYCH:  Cooperative, w/ mood & affect appropriate. MSK: Extremities w/o deformity or TTP.  Joints stable, w/o warmth. W/o cyanosis.  ED Course  Procedures  Labs Review Labs Reviewed  COMPREHENSIVE METABOLIC PANEL - Abnormal; Notable for the following:    Chloride 100 (*)    Glucose, Bld 183 (*)    BUN 48 (*)    Creatinine, Ser 2.68 (*)    Albumin 3.0 (*)    ALT 16 (*)    GFR calc non Af Amer 20 (*)    GFR calc Af Amer 23 (*)    All other components within normal limits  BRAIN NATRIURETIC PEPTIDE - Abnormal; Notable for the following:    B Natriuretic Peptide 614.0 (*)    All other components within normal limits  BASIC METABOLIC PANEL - Abnormal; Notable for the following:    Glucose, Bld 102 (*)  BUN  48 (*)    Creatinine, Ser 2.63 (*)    GFR calc non Af Amer 20 (*)    GFR calc Af Amer 24 (*)    All other components within normal limits  CBC - Abnormal; Notable for the following:    RBC 2.85 (*)    Hemoglobin 9.1 (*)    HCT 28.2 (*)    Platelets 143 (*)    All other components within normal limits  TROPONIN I - Abnormal; Notable for the following:    Troponin I 0.04 (*)    All other components within normal limits  HEMOGLOBIN A1C - Abnormal; Notable for the following:    Hgb A1c MFr Bld 6.4 (*)    All other components within normal limits  GLUCOSE, CAPILLARY - Abnormal; Notable for the following:    Glucose-Capillary 182 (*)    All other components within normal limits  GLUCOSE, CAPILLARY - Abnormal; Notable for the following:    Glucose-Capillary 111 (*)    All other components within normal limits  GLUCOSE, CAPILLARY - Abnormal; Notable for the following:    Glucose-Capillary 131 (*)    All other components within normal limits  GLUCOSE, CAPILLARY - Abnormal; Notable for the following:    Glucose-Capillary 117 (*)    All other components within normal limits  GLUCOSE, CAPILLARY - Abnormal; Notable for the following:    Glucose-Capillary 108 (*)    All other components within normal limits  GLUCOSE, CAPILLARY - Abnormal; Notable for the following:    Glucose-Capillary 101 (*)    All other components within normal limits  I-STAT CHEM 8, ED - Abnormal; Notable for the following:    Chloride 100 (*)    BUN 49 (*)    Creatinine, Ser 2.80 (*)    Glucose, Bld 184 (*)    Calcium, Ion 1.12 (*)    Hemoglobin 9.5 (*)    HCT 28.0 (*)    All other components within normal limits  MAGNESIUM  URINALYSIS, ROUTINE W REFLEX MICROSCOPIC (NOT AT [facility])  TROPONIN I  TROPONIN I  I-STAT TROPOININ, ED  I-STAT TROPOININ, ED    Imaging Review No results found.  Laboratory and Imaging results were personally reviewed by myself and used in the medical decision making of this patient's  treatment and disposition.  EKG Interpretation  EKG Interpretation  Date/Time:  Friday October 22 2015 20:54:19 EST Ventricular Rate:  67 PR Interval:  177 QRS Duration: 112 QT Interval:  446 QTC Calculation: 471 R Axis:   -39 Text Interpretation:  Sinus rhythm Left atrial enlargement LVH with IVCD, LAD and secondary repol abnrm new hyper acute T waves, possibly secondary to new widening of qrs Confirmed by FLOYD MD, DANIEL [phone]) on 10/22/2015 9:17:10 PM      MDM  [name] [name] is a 81 y.o. male with H&P as above. ED clinical course as follows:  ECG reveals sinus rhythm and repolarization abnormalities with no widening of QRS and abnormal T waves however troponin is within normal limits BNP mildly elevated  UA reveals no evidence of infection. Given sudden onset pleuritic type nature of chest pain along with patient's recent surgery and comorbidities concern have for pulmonary embolism however patient's creatinine function and GFR limit performance of scan at this time. CBC reveals baseline anemia without leukocytosis however chest x-ray reveals concerns for bilateral pneumonia therefore we'll treat for concern for hospital associated pneumonia.  patient is no vital sign abnormalities of tachycardia hypertension tachypnea or altered  mental status therefore do not suspect sepsis or severe sepsis. Patient given intravenous vancomycin and Zosyn.  Given the atypical nature of chest pain will not treat with aspirin or heparin at this time. I obtained consultation with hospitalist and after we discussed patient's ED clinical course and imaging results with laboratory studies decision was made to admit patient for further observation and management at this time. Discussion was had with hospitalist team regarding patient's requirement of definitive test for PE given our inability to rule this out on the ED due to poor kidney function. Level of care agreed upon and patient stabilized for  transport to floor.  Disposition: Admit  Clinical Impression:  1. Pleuritic chest pain    Patient care discussed with Dr. Tyrone Nine, who oversaw their evaluation & treatment & voiced agreement. House Officer: Voncille Lo, MD, Emergency Medicine.  Voncille Lo, MD 10/28/15 [place], DO 10/28/15 [place], DO 10/28/15 [phone]

## 2015-10-23 ENCOUNTER — Encounter: Payer: Self-pay | Admitting: Internal Medicine

## 2015-10-23 ENCOUNTER — Inpatient Hospital Stay (HOSPITAL_COMMUNITY): Payer: Medicare Other

## 2015-10-23 DIAGNOSIS — E785 Hyperlipidemia, unspecified: Secondary | ICD-10-CM | POA: Diagnosis present

## 2015-10-23 DIAGNOSIS — J189 Pneumonia, unspecified organism: Secondary | ICD-10-CM | POA: Diagnosis not present

## 2015-10-23 DIAGNOSIS — R079 Chest pain, unspecified: Secondary | ICD-10-CM | POA: Diagnosis not present

## 2015-10-23 DIAGNOSIS — N189 Chronic kidney disease, unspecified: Secondary | ICD-10-CM | POA: Diagnosis not present

## 2015-10-23 DIAGNOSIS — Y95 Nosocomial condition: Secondary | ICD-10-CM | POA: Diagnosis present

## 2015-10-23 DIAGNOSIS — I5042 Chronic combined systolic (congestive) and diastolic (congestive) heart failure: Secondary | ICD-10-CM | POA: Diagnosis not present

## 2015-10-23 DIAGNOSIS — E118 Type 2 diabetes mellitus with unspecified complications: Secondary | ICD-10-CM | POA: Diagnosis not present

## 2015-10-23 DIAGNOSIS — K219 Gastro-esophageal reflux disease without esophagitis: Secondary | ICD-10-CM | POA: Diagnosis present

## 2015-10-23 DIAGNOSIS — I252 Old myocardial infarction: Secondary | ICD-10-CM | POA: Diagnosis not present

## 2015-10-23 DIAGNOSIS — N183 Chronic kidney disease, stage 3 (moderate): Secondary | ICD-10-CM | POA: Diagnosis not present

## 2015-10-23 DIAGNOSIS — Z951 Presence of aortocoronary bypass graft: Secondary | ICD-10-CM | POA: Diagnosis not present

## 2015-10-23 DIAGNOSIS — Z952 Presence of prosthetic heart valve: Secondary | ICD-10-CM | POA: Diagnosis not present

## 2015-10-23 DIAGNOSIS — I255 Ischemic cardiomyopathy: Secondary | ICD-10-CM | POA: Diagnosis present

## 2015-10-23 DIAGNOSIS — Z8673 Personal history of transient ischemic attack (TIA), and cerebral infarction without residual deficits: Secondary | ICD-10-CM | POA: Diagnosis not present

## 2015-10-23 DIAGNOSIS — I2510 Atherosclerotic heart disease of native coronary artery without angina pectoris: Secondary | ICD-10-CM | POA: Diagnosis present

## 2015-10-23 DIAGNOSIS — R0781 Pleurodynia: Secondary | ICD-10-CM | POA: Diagnosis present

## 2015-10-23 DIAGNOSIS — E1122 Type 2 diabetes mellitus with diabetic chronic kidney disease: Secondary | ICD-10-CM | POA: Diagnosis present

## 2015-10-23 DIAGNOSIS — Z809 Family history of malignant neoplasm, unspecified: Secondary | ICD-10-CM | POA: Diagnosis not present

## 2015-10-23 DIAGNOSIS — Z8249 Family history of ischemic heart disease and other diseases of the circulatory system: Secondary | ICD-10-CM | POA: Diagnosis not present

## 2015-10-23 DIAGNOSIS — Z85038 Personal history of other malignant neoplasm of large intestine: Secondary | ICD-10-CM | POA: Diagnosis not present

## 2015-10-23 DIAGNOSIS — D631 Anemia in chronic kidney disease: Secondary | ICD-10-CM | POA: Diagnosis present

## 2015-10-23 DIAGNOSIS — I129 Hypertensive chronic kidney disease with stage 1 through stage 4 chronic kidney disease, or unspecified chronic kidney disease: Secondary | ICD-10-CM | POA: Diagnosis present

## 2015-10-23 LAB — CBC
HEMATOCRIT: 28.2 % — AB (ref 39.0–52.0)
HEMOGLOBIN: 9.1 g/dL — AB (ref 13.0–17.0)
MCH: 31.9 pg (ref 26.0–34.0)
MCHC: 32.3 g/dL (ref 30.0–36.0)
MCV: 98.9 fL (ref 78.0–100.0)
Platelets: 143 10*3/uL — ABNORMAL LOW (ref 150–400)
RBC: 2.85 MIL/uL — AB (ref 4.22–5.81)
RDW: 15.3 % (ref 11.5–15.5)
WBC: 5.3 10*3/uL (ref 4.0–10.5)

## 2015-10-23 LAB — BASIC METABOLIC PANEL
ANION GAP: 11 (ref 5–15)
BUN: 48 mg/dL — ABNORMAL HIGH (ref 6–20)
CO2: 26 mmol/L (ref 22–32)
Calcium: 9 mg/dL (ref 8.9–10.3)
Chloride: 101 mmol/L (ref 101–111)
Creatinine, Ser: 2.63 mg/dL — ABNORMAL HIGH (ref 0.61–1.24)
GFR, EST AFRICAN AMERICAN: 24 mL/min — AB (ref >60)
GFR, EST NON AFRICAN AMERICAN: 20 mL/min — AB (ref >60)
Glucose, Bld: 102 mg/dL — ABNORMAL HIGH (ref 65–99)
POTASSIUM: 3.6 mmol/L (ref 3.5–5.1)
SODIUM: 138 mmol/L (ref 135–145)

## 2015-10-23 LAB — TROPONIN I
TROPONIN I: 0.04 ng/mL — AB (ref <0.031)
TROPONIN I: 0.03 ng/mL (ref <0.031)
Troponin I: 0.03 ng/mL (ref <0.031)

## 2015-10-23 LAB — I-STAT TROPONIN, ED: TROPONIN I, POC: 0.03 ng/mL (ref 0.00–0.08)

## 2015-10-23 MED ORDER — NITROGLYCERIN 0.4 MG SL SUBL: 0.4000 mg | SUBLINGUAL_TABLET | SUBLINGUAL | Status: DC | PRN

## 2015-10-23 MED ORDER — SODIUM CHLORIDE 0.9 % IJ SOLN
3.0000 mL | Freq: Two times a day (BID) | INTRAMUSCULAR | Status: DC
  Administered 2015-10-23 – 2015-10-26 (×4): 3 mL via INTRAVENOUS

## 2015-10-23 MED ORDER — CHLORPROMAZINE HCL 10 MG PO TABS
10.0000 mg | ORAL_TABLET | Freq: Three times a day (TID) | ORAL | Status: DC
  Administered 2015-10-23 – 2015-10-24 (×4): 10 mg via ORAL
  Filled 2015-10-23 (×7): qty 1

## 2015-10-23 MED ORDER — SODIUM CHLORIDE 0.9 % IV SOLN: 250.0000 mL | INTRAVENOUS | Status: DC | PRN

## 2015-10-23 MED ORDER — ONDANSETRON HCL 4 MG/2ML IJ SOLN: 4.0000 mg | Freq: Four times a day (QID) | INTRAMUSCULAR | Status: DC | PRN

## 2015-10-23 MED ORDER — SODIUM CHLORIDE 0.9 % IJ SOLN
3.0000 mL | Freq: Two times a day (BID) | INTRAMUSCULAR | Status: DC
  Administered 2015-10-23 – 2015-10-26 (×7): 3 mL via INTRAVENOUS

## 2015-10-23 MED ORDER — FUROSEMIDE 80 MG PO TABS
80.0000 mg | ORAL_TABLET | Freq: Every day | ORAL | Status: DC
Start: 2015-10-23 — End: 2015-10-26
  Administered 2015-10-23 – 2015-10-26 (×4): 80 mg via ORAL
  Filled 2015-10-23 (×4): qty 1

## 2015-10-23 MED ORDER — ACETAMINOPHEN 650 MG RE SUPP: 650.0000 mg | Freq: Four times a day (QID) | RECTAL | Status: DC | PRN

## 2015-10-23 MED ORDER — ENOXAPARIN SODIUM 30 MG/0.3ML ~~LOC~~ SOLN
30.0000 mg | Freq: Every day | SUBCUTANEOUS | Status: DC
  Administered 2015-10-23 – 2015-10-25 (×3): 30 mg via SUBCUTANEOUS
  Filled 2015-10-23 (×3): qty 0.3

## 2015-10-23 MED ORDER — OXYCODONE HCL 5 MG PO TABS: 5.0000 mg | ORAL_TABLET | ORAL | Status: DC | PRN

## 2015-10-23 MED ORDER — ASPIRIN EC 325 MG PO TBEC
325.0000 mg | DELAYED_RELEASE_TABLET | Freq: Every day | ORAL | Status: DC
  Administered 2015-10-23 – 2015-10-26 (×4): 325 mg via ORAL
  Filled 2015-10-23 (×4): qty 1

## 2015-10-23 MED ORDER — PANTOPRAZOLE SODIUM 40 MG PO TBEC
40.0000 mg | DELAYED_RELEASE_TABLET | Freq: Every day | ORAL | Status: DC
  Administered 2015-10-23 – 2015-10-26 (×4): 40 mg via ORAL
  Filled 2015-10-23 (×4): qty 1

## 2015-10-23 MED ORDER — VANCOMYCIN HCL IN DEXTROSE 1-5 GM/200ML-% IV SOLN
1000.0000 mg | INTRAVENOUS | Status: DC
  Administered 2015-10-24: 1000 mg via INTRAVENOUS
  Filled 2015-10-23: qty 200

## 2015-10-23 MED ORDER — PIPERACILLIN-TAZOBACTAM IN DEX 2-0.25 GM/50ML IV SOLN
2.2500 g | Freq: Four times a day (QID) | INTRAVENOUS | Status: DC
  Administered 2015-10-23 – 2015-10-24 (×3): 2.25 g via INTRAVENOUS
  Filled 2015-10-23 (×7): qty 50

## 2015-10-23 MED ORDER — TECHNETIUM TO 99M ALBUMIN AGGREGATED
4.0600 | Freq: Once | INTRAVENOUS | Status: AC | PRN
  Administered 2015-10-23: 4 via INTRAVENOUS

## 2015-10-23 MED ORDER — HYDRALAZINE HCL 25 MG PO TABS
25.0000 mg | ORAL_TABLET | Freq: Three times a day (TID) | ORAL | Status: DC
  Administered 2015-10-23 – 2015-10-26 (×10): 25 mg via ORAL
  Filled 2015-10-23 (×10): qty 1

## 2015-10-23 MED ORDER — ONDANSETRON HCL 4 MG PO TABS: 4.0000 mg | ORAL_TABLET | Freq: Four times a day (QID) | ORAL | Status: DC | PRN

## 2015-10-23 MED ORDER — HYDROMORPHONE HCL 1 MG/ML IJ SOLN: 0.5000 mg | INTRAMUSCULAR | Status: DC | PRN

## 2015-10-23 MED ORDER — TAMSULOSIN HCL 0.4 MG PO CAPS
0.4000 mg | ORAL_CAPSULE | Freq: Every day | ORAL | Status: DC
  Administered 2015-10-23 – 2015-10-25 (×3): 0.4 mg via ORAL
  Filled 2015-10-23 (×3): qty 1

## 2015-10-23 MED ORDER — ACETAMINOPHEN 325 MG PO TABS: 650.0000 mg | ORAL_TABLET | Freq: Four times a day (QID) | ORAL | Status: DC | PRN

## 2015-10-23 MED ORDER — LATANOPROST 0.005 % OP SOLN
1.0000 [drp] | Freq: Every day | OPHTHALMIC | Status: DC
  Administered 2015-10-23 – 2015-10-25 (×4): 1 [drp] via OPHTHALMIC
  Filled 2015-10-23 (×2): qty 2.5

## 2015-10-23 MED ORDER — ADULT MULTIVITAMIN W/MINERALS CH
1.0000 | ORAL_TABLET | Freq: Every day | ORAL | Status: DC
  Administered 2015-10-23 – 2015-10-26 (×4): 1 via ORAL
  Filled 2015-10-23 (×4): qty 1

## 2015-10-23 MED ORDER — SODIUM CHLORIDE 0.9 % IJ SOLN
3.0000 mL | INTRAMUSCULAR | Status: DC | PRN
  Administered 2015-10-25: 3 mL via INTRAVENOUS
  Filled 2015-10-23: qty 3

## 2015-10-23 MED ORDER — ALUM & MAG HYDROXIDE-SIMETH 200-200-20 MG/5ML PO SUSP: 30.0000 mL | Freq: Four times a day (QID) | ORAL | Status: DC | PRN

## 2015-10-23 MED ORDER — CARVEDILOL 25 MG PO TABS
25.0000 mg | ORAL_TABLET | Freq: Two times a day (BID) | ORAL | Status: DC
  Administered 2015-10-23 – 2015-10-26 (×7): 25 mg via ORAL
  Filled 2015-10-23 (×7): qty 1

## 2015-10-23 MED ORDER — TECHNETIUM TC 99M DIETHYLENETRIAME-PENTAACETIC ACID: 30.5000 | Freq: Once | INTRAVENOUS | Status: DC | PRN

## 2015-10-23 MED ORDER — VITAMIN D 1000 UNITS PO TABS
1000.0000 [IU] | ORAL_TABLET | Freq: Every day | ORAL | Status: DC
  Administered 2015-10-23 – 2015-10-25 (×3): 1000 [IU] via ORAL
  Filled 2015-10-23 (×3): qty 1

## 2015-10-23 NOTE — Progress Notes (Signed)
Patient seen and evaluated earlier this aM by my associate. Please refer to H and P regarding assessment and plan.  Will reassess next am unless there is an acute change in medical condition.  VQ scan negative for PE  Gen: Pt in nad, alert and awake CV: s1 and s2 wnl, no rubs Pulm: no wheezes, equal chest rise, speaking in full sentences  Florie Carico, Linward Foster

## 2015-10-23 NOTE — ED Notes (Signed)
Dr. Arnoldo Morale bedside.

## 2015-10-23 NOTE — Progress Notes (Signed)
ANTIBIOTIC CONSULT NOTE - INITIAL  Pharmacy Consult for vanc/zosyn Indication: pneumonia  Allergies  Allergen Reactions  . Statins Other (See Comments)    Pain/weakness in legs    Patient Measurements: Height: 5\' 8"  (172.7 cm) Weight: 148 lb (67.132 kg) IBW/kg (Calculated) : 68.4 Adjusted Body Weight:   Vital Signs: Temp: 97.3 F (36.3 C) (11/12 1205) Temp Source: Oral (11/12 1205) BP: 150/66 mmHg (11/12 1205) Pulse Rate: 60 (11/12 1205) Intake/Output from previous day: 11/11 0701 - 11/12 0700 In: 303 [I.V.:3; IV Piggyback:300] Out: 200 [Urine:200] Intake/Output from this shift: Total I/O In: 600 [P.O.:600] Out: 1 [Stool:1]  Labs:  Recent Labs  10/22/15 2200 10/22/15 2207 10/23/15 0230  WBC  --   --  5.3  HGB  --  9.5* 9.1*  PLT  --   --  143*  CREATININE 2.68* 2.80* 2.63*   Estimated Creatinine Clearance: 18.8 mL/min (by C-G formula based on Cr of 2.63). No results for input(s): VANCOTROUGH, VANCOPEAK, VANCORANDOM, GENTTROUGH, GENTPEAK, GENTRANDOM, TOBRATROUGH, TOBRAPEAK, TOBRARND, AMIKACINPEAK, AMIKACINTROU, AMIKACIN in the last 72 hours.   Microbiology: No results found for this or any previous visit (from the past 720 hour(s)).  Medical History: Past Medical History  Diagnosis Date  . History of colon cancer     s/p colon resection  . Aortic stenosis     a. s/p AVR with 31mm Edwards pericardial valve 02/07/12 - post-op course complicated by pleural effusion requring thoracentesis/leg cellulitis 03/2012.  Marland Kitchen CAD (coronary artery disease)     a. s/p NSTEMI 12/2011:  LHC - Ostial left main 20%, ostial LAD 50%, mid 60-70%, ostial D1 40% and mid 40%, D2 70%, ostial circumflex occluded, ostial RCA 80-90%, LVEDP was 42. b.  s/p CABG x 3 at time of AVR (LiMA-LAD, SVG-2nd daigonal, SVG-PDA) 02/07/12 (post-op course noted above).  . Ischemic cardiomyopathy   . Chronic systolic heart failure ([place])     a. TEE 01/2012: EF 25-30%, diffuse hypokinesis. b. Not on ACEI due  to renal insufficiency.;  c. follow up  echo 08/06/12: EF 25%, mod diast dysfxn, AVR ok, mild MR, mod LAE, mild RAE, mild to mod RV systolic dysfunction  . HLD (hyperlipidemia)   . GERD (gastroesophageal reflux disease)   . Chronic kidney disease   . Pleural effusion     a. post-operatively after AVR/CABG s/p thoracentesis 03/2012 yielding 1L serosanguinous fluid.  . Diabetes mellitus     borderline  . Blood transfusion     NO REACTION TO TRANSFUSION  . Cellulitis     a. RLE cellulitis 2 months post-operatively after AVR/CABG - serratia marcessans, tx with I&D/antibiotics  . Mitral regurgitation     Moderate by TEE 01/2012  . Flash pulmonary edema ([place])     Post-cath 12/2011, went into acute pulm edema requiring IV lasix and intubation  . Baker's cyst 07/23/12    Incidental finding of LE venous dopplers  . Cataract     right eye, hx of  . HTN (hypertension)     primary, Dr. Hollace Kinnier  . Myocardial infarction ([place]) 12/2011  . CHF (congestive heart failure) ([place])   . Stroke ([place]) 10/01    RIght Leg weakness  . Cancer [facility]) '90's    Colon  . Anemia   . Anxiety     Medications:  Scheduled:  . aspirin EC  325 mg Oral Daily  . carvedilol  25 mg Oral BID WC  . chlorproMAZINE  10 mg Oral TID  . cholecalciferol  1,000  Units Oral Q supper  . enoxaparin (LOVENOX) injection  30 mg Subcutaneous Daily  . furosemide  80 mg Oral Daily  . hydrALAZINE  25 mg Oral 3 times per day  . latanoprost  1 drop Both Eyes QHS  . multivitamin with minerals  1 tablet Oral Daily  . pantoprazole  40 mg Oral Daily  . sodium chloride  3 mL Intravenous Q12H  . sodium chloride  3 mL Intravenous Q12H  . tamsulosin  0.4 mg Oral QPC supper   Infusions:   Assessment: 81 yo who presented with CP and SOB. He has many comorbidity including CHF, AVR, HTN. He had a recent hernia repair in 10/16. Now also shown for PNA on CXR. Vanc and zosyn has been started for PNA. Pt received both last PM around 0000. Due to his  CRI, they will last a while in him.   Goal of Therapy:  Vancomycin trough level 15-20 mcg/ml  Plan:   Vanc 1.25g IV x1 in ED then 1g IV q48 Zosyn 2.25g IV q6 F/u with level if needed  Onnie Boer, PharmD Pager: [phone] 10/23/2015 3:34 PM

## 2015-10-23 NOTE — H&P (Signed)
[facility] Admission History and Physical       [name] [name] [id number] DOB: 09-11-1926 DOA: 10/22/2015  Referring physician: EDP PCP: Hollace Kinnier, DO  Specialists:   Chief Complaint: Chest Pain and SOB  HPI: [name] [name] is a 81 y.o. male with a history of CAD/CABG, Systolic CHF, S/P AVR, HTN, DM2, CKD, Hyperlipidemia who presents to the ED with complaints of sudden onset of Chest Pain with deep breaths that began a t around 7 pm.   He took one SL NTG without relief.   He had a recent Umbilical Hernia Repair in 09/13/2015 and at that time developed aspiration pneumonia and was treated with antibiotic rx.   He was evaluated and found to have bilateral Pneumonia on chest X-ray.   He was placed on IV antibiotic therapy for HCAP and referred for medical admission.      Review of Systems:  Constitutional: No Weight Loss, No Weight Gain, Night Sweats, Fevers, Chills, Dizziness, Light Headedness, Fatigue, or Generalized Weakness HEENT: No Headaches, Difficulty Swallowing,Tooth/Dental Problems,Sore Throat,  No Sneezing, Rhinitis, Ear Ache, Nasal Congestion, or Post Nasal Drip,  Cardio-vascular:  +Chest pain, Orthopnea, PND, Edema in Lower Extremities, Anasarca, Dizziness, Palpitations  Resp: +Dyspnea, No DOE, No Productive Cough, No Non-Productive Cough, No Hemoptysis, No Wheezing.    GI: No Heartburn, Indigestion, Abdominal Pain, Nausea, Vomiting, Diarrhea, Constipation, Hematemesis, Hematochezia, Melena, Change in Bowel Habits,  Loss of Appetite  GU: No Dysuria, No Change in Color of Urine, No Urgency or Urinary Frequency, No Flank pain.  Musculoskeletal: No Joint Pain or Swelling, No Decreased Range of Motion, No Back Pain.  Neurologic: No Syncope, No Seizures, Muscle Weakness, Paresthesia, Vision Disturbance or Loss, No Diplopia, No Vertigo, No Difficulty Walking,  Skin: No Rash or Lesions. Psych: No Change in Mood or Affect, No Depression or Anxiety, No Memory loss, No  Confusion, or Hallucinations   Past Medical History  Diagnosis Date  . History of colon cancer     s/p colon resection  . Aortic stenosis     a. s/p AVR with 49mm Edwards pericardial valve 02/07/12 - post-op course complicated by pleural effusion requring thoracentesis/leg cellulitis 03/2012.  Marland Kitchen CAD (coronary artery disease)     a. s/p NSTEMI 12/2011:  LHC - Ostial left main 20%, ostial LAD 50%, mid 60-70%, ostial D1 40% and mid 40%, D2 70%, ostial circumflex occluded, ostial RCA 80-90%, LVEDP was 42. b.  s/p CABG x 3 at time of AVR (LiMA-LAD, SVG-2nd daigonal, SVG-PDA) 02/07/12 (post-op course noted above).  . Ischemic cardiomyopathy   . Chronic systolic heart failure ([place])     a. TEE 01/2012: EF 25-30%, diffuse hypokinesis. b. Not on ACEI due to renal insufficiency.;  c. follow up  echo 08/06/12: EF 25%, mod diast dysfxn, AVR ok, mild MR, mod LAE, mild RAE, mild to mod RV systolic dysfunction  . HLD (hyperlipidemia)   . GERD (gastroesophageal reflux disease)   . Chronic kidney disease   . Pleural effusion     a. post-operatively after AVR/CABG s/p thoracentesis 03/2012 yielding 1L serosanguinous fluid.  . Diabetes mellitus     borderline  . Blood transfusion     NO REACTION TO TRANSFUSION  . Cellulitis     a. RLE cellulitis 2 months post-operatively after AVR/CABG - serratia marcessans, tx with I&D/antibiotics  . Mitral regurgitation     Moderate by TEE 01/2012  . Flash pulmonary edema ([place])     Post-cath 12/2011, went into acute pulm edema requiring IV  lasix and intubation  . Baker's cyst 07/23/12    Incidental finding of LE venous dopplers  . Cataract     right eye, hx of  . HTN (hypertension)     primary, Dr. Hollace Kinnier  . Myocardial infarction ([place]) 12/2011  . CHF (congestive heart failure) ([place])   . Stroke ([place]) 10/01    RIght Leg weakness  . Cancer [facility]) '90's    Colon  . Anemia   . Anxiety      Past Surgical History  Procedure Laterality Date  . Colon resection  1996    . Esophageal dilation    . Colonoscopy    . Cataract extraction      rt  . Cardiac catheterization      1.3.13  stopped breathing, put on ventilator for 5-6 days  . Tonsillectomy    . Aortic valve replacement  02/07/2012    Procedure: AORTIC VALVE REPLACEMENT (AVR);  Surgeon: Gaye Pollack, MD;  Location: [place];  Service: Open Heart Surgery;  Laterality: N/A;  . Coronary artery bypass graft  02/07/2012    Procedure: CORONARY ARTERY BYPASS GRAFTING (CABG);  Surgeon: Gaye Pollack, MD;  Location: [place];  Service: Open Heart Surgery;  Laterality: N/A;  CABG x three; using right leg greater saphenous vein harvested endoscopically  . Chest tube insertion  07/24/2012    Procedure: INSERTION PLEURAL DRAINAGE CATHETER;  Surgeon: Gaye Pollack, MD;  Location: [place];  Service: Thoracic;  Laterality: Left;  . Talc pleurodesis  08/30/2012    Procedure: TALC PLEURADESIS;  Surgeon: Gaye Pollack, MD;  Location: [place];  Service: Thoracic;  Laterality: Left;  INSERTION OF TALC VIA LEFT PLEURX  . Talc pleurodesis  09/12/2012    Procedure: TALC PLEURADESIS;  Surgeon: Gaye Pollack, MD;  Location: [place];  Service: Thoracic;  Laterality: Left;  . Removal of pleural drainage catheter  09/27/2012    Procedure: REMOVAL OF PLEURAL DRAINAGE CATHETER;  Surgeon: Gaye Pollack, MD;  Location: [place];  Service: Thoracic;  Laterality: Left;  Marland Kitchen Eye surgery  2009    cataract removed from right eye  . Esophagogastroduodenoscopy N/A 07/03/2014    Procedure: ESOPHAGOGASTRODUODENOSCOPY (EGD);  Surgeon: Arta Silence, MD;  Location: [facility] ENDOSCOPY;  Service: Endoscopy;  Laterality: N/A;  . Esophagoscopy with dilitation N/A 07/07/2014    Procedure: ESOPHAGOSCOPY WITH DILITATION/SAVARY DILATOR;  Surgeon: Rozetta Nunnery, MD;  Location: [place];  Service: ENT;  Laterality: N/A;  . Ercp N/A 07/08/2014    Procedure: ENDOSCOPIC RETROGRADE CHOLANGIOPANCREATOGRAPHY (ERCP);  Surgeon: Missy Sabins, MD;  Location: [facility] ENDOSCOPY;   Service: Endoscopy;  Laterality: N/A;  . Left heart catheterization with coronary angiogram N/A 12/13/2011    Procedure: LEFT HEART CATHETERIZATION WITH CORONARY ANGIOGRAM;  Surgeon: Peter M Martinique, MD;  Location: [facility] CATH LAB;  Service: Cardiovascular;  Laterality: N/A;  . Inguinal hernia repair Right 09/13/2015    Procedure: OPEN RIGHT INGUINAL HERNIA;  Surgeon: Mickeal Skinner, MD;  Location: [place];  Service: General;  Laterality: Right;  . Insertion of mesh Right 09/13/2015    Procedure: INSERTION OF MESH;  Surgeon: Arta Bruce Kinsinger, MD;  Location: [place];  Service: General;  Laterality: Right;      Prior to Admission medications   Medication Sig Start Date End Date Taking? Authorizing Provider  aspirin EC 325 MG tablet Take 325 mg by mouth daily.    Yes Historical Provider, MD  carvedilol (COREG) 25 MG tablet TAKE 1  TABLET BY MOUTH TWICE A DAY WITH A MEAL 08/03/15  Yes Jolaine Artist, MD  chlorproMAZINE (THORAZINE) 10 MG tablet Take 10 mg by mouth 3 (three) times daily.   Yes Historical Provider, MD  cholecalciferol (VITAMIN D) 1000 UNITS tablet Take 1,000 Units by mouth daily with supper.    Yes Historical Provider, MD  furosemide (LASIX) 40 MG tablet Take 2 tablets (80 mg total) by mouth daily. 02/08/15  Yes Jolaine Artist, MD  glimepiride (AMARYL) 1 MG tablet Take 1 tablet (1 mg total) by mouth daily with breakfast. 09/23/15  Yes Nita Sells, MD  glucose blood (FREESTYLE LITE) test strip Use to test blood sugar three times daily. Dx: E11.8 10/22/15  Yes [name] L [name], DO  hydrALAZINE (APRESOLINE) 25 MG tablet TAKE 1 TABLET BY MOUTH 3 TIMES A DAY 07/12/15  Yes [name] L [name], DO  isosorbide mononitrate (IMDUR) 60 MG 24 hr tablet TAKE 1 TABLET BY MOUTH EVERY DAY 05/04/15  Yes Jolaine Artist, MD  Lancets (FREESTYLE) lancets Use to check blood sugar three times daily. Dx: E11.8 10/22/15  Yes [name] L [name], DO  Multiple Vitamin (MULTIVITAMIN WITH MINERALS) TABS tablet  Take 1 tablet by mouth daily.   Yes Historical Provider, MD  nitroGLYCERIN (NITROSTAT) 0.4 MG SL tablet Place 0.4 mg under the tongue every 5 (five) minutes as needed for chest pain.   Yes Historical Provider, MD  omeprazole (PRILOSEC) 20 MG capsule Take 20 mg by mouth daily.    Yes Historical Provider, MD  tamsulosin (FLOMAX) 0.4 MG CAPS capsule TAKE 1 CAPSULE EVERY DAY AFTER DINNER 10/04/15  Yes [name] L [name], DO  Travoprost, BAK Free, (TRAVATAN) 0.004 % SOLN ophthalmic solution Place 1 drop into both eyes daily.    Yes Historical Provider, MD     Allergies  Allergen Reactions  . Statins Other (See Comments)    Pain/weakness in legs    Social History:  reports that he has never smoked. He has never used smokeless tobacco. He reports that he does not drink alcohol or use illicit drugs.    Family History  Problem Relation Age of Onset  . Cancer Mother   . Heart attack Father   . Mental illness Brother   . Kidney failure Brother        Physical Exam:  GEN:  Pleasant Elderly Obese 81 y.o. Caucasian male examined and in no acute distress; cooperative with exam Filed Vitals:   10/22/15 2100 10/22/15 2101 10/22/15 2115 10/23/15 0027  BP: 160/60 160/60 156/60 130/56  Pulse: 79 62 63 62  Temp:  97.9 F (36.6 C)    TempSrc:  Oral    Resp: 35 33 31 16  Height:  5\' 8"  (1.727 m)    Weight:  67.132 kg (148 lb)    SpO2: 100% 100% 99% 96%   Blood pressure 130/56, pulse 62, temperature 97.9 F (36.6 C), temperature source Oral, resp. rate 16, height 5\' 8"  (1.727 m), weight 67.132 kg (148 lb), SpO2 96 %. PSYCH: He is alert and oriented x4; does not appear anxious does not appear depressed; affect is normal HEENT: Normocephalic and Atraumatic, Mucous membranes pink; PERRLA; EOM intact; Fundi:  Benign;  No scleral icterus, Nares: Patent, Oropharynx: Clear, Edentulous with Dentures,    Neck:  FROM, No Cervical Lymphadenopathy nor Thyromegaly or Carotid Bruit; No JVD; Breasts:: Not  examined CHEST WALL: No tenderness CHEST: Normal respiration, clear to auscultation bilaterally HEART: Regular rate and rhythm; no murmurs rubs or  gallops BACK: No kyphosis or scoliosis; No CVA tenderness ABDOMEN: Positive Bowel Sounds, Obese, Soft Non-Tender, No Rebound or Guarding; No Masses, No Organomegaly. Rectal Exam: Not done EXTREMITIES: No Cyanosis, Clubbing, or Edema; No Ulcerations. Genitalia: not examined PULSES: 2+ and symmetric SKIN: Normal hydration no rash or ulceration CNS:  Alert and Oriented x 4, No Focal Deficits Vascular: pulses palpable throughout    Labs on Admission:  Basic Metabolic Panel:  Recent Labs Lab 10/22/15 2200 10/22/15 2207  NA 136 138  K 3.9 3.9  CL 100* 100*  CO2 25  --   GLUCOSE 183* 184*  BUN 48* 49*  CREATININE 2.68* 2.80*  CALCIUM 8.9  --   MG 2.3  --    Liver Function Tests:  Recent Labs Lab 10/22/15 2200  AST 27  ALT 16*  ALKPHOS 54  BILITOT 0.7  PROT 6.6  ALBUMIN 3.0*   No results for input(s): LIPASE, AMYLASE in the last 168 hours. No results for input(s): AMMONIA in the last 168 hours. CBC:  Recent Labs Lab 10/22/15 2207  HGB 9.5*  HCT 28.0*   Cardiac Enzymes: No results for input(s): CKTOTAL, CKMB, CKMBINDEX, TROPONINI in the last 168 hours.  BNP (last 3 results)  Recent Labs  10/06/15 1630 10/22/15 2200  BNP 1338.4* 614.0*    ProBNP (last 3 results) No results for input(s): PROBNP in the last 8760 hours.  CBG: No results for input(s): GLUCAP in the last 168 hours.  Radiological Exams on Admission: Dg Chest Portable 1 View  10/22/2015  CLINICAL DATA:  Acute onset of upper chest pain, worse with deep breaths. Initial encounter. EXAM: PORTABLE CHEST 1 VIEW COMPARISON:  Chest radiograph performed 09/20/2015 FINDINGS: The lungs are well-aerated. Mild patchy right midlung and left basilar airspace opacities raise question for mild pneumonia. There is no evidence of pleural effusion or pneumothorax.  The cardiomediastinal silhouette is mildly enlarged. The patient is status post median sternotomy. No acute osseous abnormalities are seen. IMPRESSION: 1. Mild patchy right mid lung and left basilar airspace opacities raise question for mild pneumonia. 2. Mild cardiomegaly. Electronically Signed   By: Garald Balding M.D.   On: 10/22/2015 21:41     EKG: Independently reviewed.  Normal Sinus Rhythm rate = 67 + LVH changes    Assessment/Plan:      81 y.o. male with  Principal Problem:   1.    HCAP (healthcare-associated pneumonia)   IV Vancomycin and Zosyn   Albuterol Nebs   O2 PRN   Active Problems:   2.    Pleuritic chest pain   V/Q Scan in AM   Cardiac Monitoring     3.    Essential hypertension   Continue Carvedilol, and Hydralazine Rx   Monitor BPs     4.    Anemia due to chronic kidney disease   Anemia Panel ordered       5.    Chronic combined systolic and diastolic heart failure, NYHA class 2 (HCC)   Continue Lasix, Carvedilol Rx   Monitor I/Os     6.    Chronic kidney disease, stage III (moderate): Cr ~2.2 baseline   Monitor BUN/Cr     7.    Diabetes mellitus type 2, controlled, with complications ([place])   SSI coverage PRN     8.    DVT Prophylaxis   Lovenox    Code Status:     FULL CODE        Family Communication:  Wife at Bedside      Disposition Plan:    Inpatient Status        Time spent:   [place] [facility] Pager [phone]   If [place] Please Contact the Day Rounding Team MD for [facility]  If 7PM-7AM, Please Contact Night-Floor Coverage  [email or web address] Password [email or web address] 10/23/2015, 12:37 AM     ADDENDUM:   Patient was seen and examined on 10/23/2015

## 2015-10-23 NOTE — Progress Notes (Signed)
Report received from [facility], [place] in the ED; the patient has arrived to the unit with his wife.  Nursing assessment completed and vital signs are stable; patient is alert, awake and oriented, he denies any pain; will continue to monitor and follow physician orders. Ruben Im RN

## 2015-10-24 DIAGNOSIS — I5042 Chronic combined systolic (congestive) and diastolic (congestive) heart failure: Secondary | ICD-10-CM

## 2015-10-24 DIAGNOSIS — J189 Pneumonia, unspecified organism: Principal | ICD-10-CM

## 2015-10-24 DIAGNOSIS — I10 Essential (primary) hypertension: Secondary | ICD-10-CM

## 2015-10-24 DIAGNOSIS — N189 Chronic kidney disease, unspecified: Secondary | ICD-10-CM

## 2015-10-24 DIAGNOSIS — E118 Type 2 diabetes mellitus with unspecified complications: Secondary | ICD-10-CM

## 2015-10-24 DIAGNOSIS — D631 Anemia in chronic kidney disease: Secondary | ICD-10-CM

## 2015-10-24 DIAGNOSIS — N183 Chronic kidney disease, stage 3 (moderate): Secondary | ICD-10-CM

## 2015-10-24 LAB — GLUCOSE, CAPILLARY: Glucose-Capillary: 182 mg/dL — ABNORMAL HIGH (ref 65–99)

## 2015-10-24 MED ORDER — DEXTROSE 5 % IV SOLN
1.0000 g | INTRAVENOUS | Status: DC
  Administered 2015-10-24: 1 g via INTRAVENOUS
  Filled 2015-10-24 (×2): qty 1

## 2015-10-24 MED ORDER — ISOSORBIDE MONONITRATE ER 60 MG PO TB24
60.0000 mg | ORAL_TABLET | Freq: Every day | ORAL | Status: DC
  Administered 2015-10-24 – 2015-10-26 (×3): 60 mg via ORAL
  Filled 2015-10-24 (×3): qty 1

## 2015-10-24 MED ORDER — GLIMEPIRIDE 1 MG PO TABS
1.0000 mg | ORAL_TABLET | Freq: Every day | ORAL | Status: DC
  Administered 2015-10-25 – 2015-10-26 (×2): 1 mg via ORAL
  Filled 2015-10-24 (×4): qty 1

## 2015-10-24 NOTE — Progress Notes (Signed)
[facility] PROGRESS NOTE    Progress Note   [name] [name] [id number] DOB: [birth year] DOA: 10/22/2015 PCP: Hollace Kinnier, DO   Brief Narrative:   [name] [name] is an 81 y.o. male past medical history of CABG, chronic systolic heart failure, status post aVR, diabetes of 2 and chronic renal disease presents to the ED was given onset of chest pain with deep breath and shortness of breath without nitroglycerin relief.  Assessment/Plan:   HCAP (healthcare-associated pneumonia) - Started empirically on vancomycin and Fortaz on 10/23/2015. - CT scan was negative for PE, chest x-ray showed a right lower lobe infiltrates, saturations are remained greater than 90% on room air. - Had a recent hernia repair, I am concerned about aspiration pneumonia, we'll check a swallowing evaluation continue vancomycin and Fortaz. - He refuses to go to skilled nursing facility.  Essential hypertension: - Continue Coreg and hydralazine, blood pressure is at goal.  Anemia due to chronic kidney disease: Hemoglobin is stable.  Chronic combined systolic and diastolic heart failure, NYHA class 2 (HCC) Seems to be euvolemic.  Chronic kidney disease, stage III (moderate): Cr ~2.2 baseline Creatinine at baseline.  Diabetes mellitus type 2, controlled, with complications ([place]) Blood glucose habits improved since admission. Resume glimepiride continue check CBGs before meals and at bedtime     DVT Prophylaxis - Lovenox ordered.  Family Communication: wife Disposition Plan: Home when stable. Code Status:     Code Status Orders        Start     Ordered   10/23/15 0111  Full code   Continuous     10/23/15 0110    Advance Directive Documentation        Most Recent Value   Type of Advance Directive  Living will   Pre-existing out of facility DNR order (yellow form or pink MOST form)     "MOST" Form in Place?          IV Access:    Peripheral IV   Procedures and diagnostic  studies:   Nm Pulmonary Perf And Vent  10/23/2015  CLINICAL DATA:  Pt was imaged due to pleuritic chest pain. Chest pain started Friday night at 1900 hours. History of CAD. EXAM: NUCLEAR MEDICINE VENTILATION - PERFUSION LUNG SCAN TECHNIQUE: Ventilation images were obtained in multiple projections using inhaled aerosol Tc-21m DTPA. Perfusion images were obtained in multiple projections after intravenous injection of Tc-8m MAA. RADIOPHARMACEUTICALS:  30.5 Technetium-47m DTPA aerosol inhalation and 4.06 Technetium-74m MAA IV COMPARISON:  Chest x-ray 10/22/2015 FINDINGS: Ventilation: No focal ventilation defect. Perfusion: No wedge shaped peripheral perfusion defects to suggest acute pulmonary embolism. IMPRESSION: No evidence for acute pulmonary embolus. Electronically Signed   By: Nolon Nations M.D.   On: 10/23/2015 09:32   Dg Chest Portable 1 View  10/22/2015  CLINICAL DATA:  Acute onset of upper chest pain, worse with deep breaths. Initial encounter. EXAM: PORTABLE CHEST 1 VIEW COMPARISON:  Chest radiograph performed 09/20/2015 FINDINGS: The lungs are well-aerated. Mild patchy right midlung and left basilar airspace opacities raise question for mild pneumonia. There is no evidence of pleural effusion or pneumothorax. The cardiomediastinal silhouette is mildly enlarged. The patient is status post median sternotomy. No acute osseous abnormalities are seen. IMPRESSION: 1. Mild patchy right mid lung and left basilar airspace opacities raise question for mild pneumonia. 2. Mild cardiomegaly. Electronically Signed   By: Garald Balding M.D.   On: 10/22/2015 21:41     Medical Consultants:    None.  Anti-Infectives:  Anti-infectives    Start     Dose/Rate Route Frequency Ordered Stop   10/24/15 2300  vancomycin (VANCOCIN) IVPB 1000 mg/200 mL premix     1,000 mg 200 mL/hr over 60 Minutes Intravenous Every 48 hours 10/23/15 1536     10/23/15 1600  piperacillin-tazobactam (ZOSYN) IVPB 2.25 g      2.25 g 100 mL/hr over 30 Minutes Intravenous 4 times per day 10/23/15 1536     10/22/15 2315  vancomycin (VANCOCIN) 1,250 mg in sodium chloride 0.9 % 250 mL IVPB     1,250 mg 166.7 mL/hr over 90 Minutes Intravenous  Once 10/22/15 2304 10/23/15 0122   10/22/15 2315  piperacillin-tazobactam (ZOSYN) IVPB 3.375 g     3.375 g 100 mL/hr over 30 Minutes Intravenous  Once 10/22/15 2304 10/23/15 0022      Subjective:    [name] [name] he relates his breathing is slightly improved, he still gets winded when going to the bathroom.  Objective:    Filed Vitals:   10/23/15 2233 10/24/15 0030 10/24/15 0530 10/24/15 0857  BP:  147/46 152/65 119/66  Pulse: 63 61 60 58  Temp:  98.1 F (36.7 C) 98 F (36.7 C) 97.7 F (36.5 C)  TempSrc:  Oral Oral Oral  Resp:  18 18 18   Height:      Weight:   65.137 kg (143 lb 9.6 oz)   SpO2:  95% 94% 98%    Intake/Output Summary (Last 24 hours) at 10/24/15 0901 Last data filed at 10/24/15 0849  Gross per 24 hour  Intake   1250 ml  Output    851 ml  Net    399 ml   Filed Weights   10/22/15 2101 10/24/15 0530  Weight: 67.132 kg (148 lb) 65.137 kg (143 lb 9.6 oz)    Exam: Gen:  NAD Cardiovascular:  RRR. Chest and lungs:   CTAB Abdomen:  Abdomen soft, NT/ND, + BS Extremities:  No C/E/C   Data Reviewed:    Labs: Basic Metabolic Panel:  Recent Labs Lab 10/22/15 2200 10/22/15 2207 10/23/15 0230  NA 136 138 138  K 3.9 3.9 3.6  CL 100* 100* 101  CO2 25  --  26  GLUCOSE 183* 184* 102*  BUN 48* 49* 48*  CREATININE 2.68* 2.80* 2.63*  CALCIUM 8.9  --  9.0  MG 2.3  --   --    GFR Estimated Creatinine Clearance: 18.2 mL/min (by C-G formula based on Cr of 2.63). Liver Function Tests:  Recent Labs Lab 10/22/15 2200  AST 27  ALT 16*  ALKPHOS 54  BILITOT 0.7  PROT 6.6  ALBUMIN 3.0*   No results for input(s): LIPASE, AMYLASE in the last 168 hours. No results for input(s): AMMONIA in the last 168 hours. Coagulation profile No  results for input(s): INR, PROTIME in the last 168 hours.  CBC:  Recent Labs Lab 10/22/15 2207 10/23/15 0230  WBC  --  5.3  HGB 9.5* 9.1*  HCT 28.0* 28.2*  MCV  --  98.9  PLT  --  143*   Cardiac Enzymes:  Recent Labs Lab 10/23/15 0230 10/23/15 0754 10/23/15 1450  TROPONINI 0.03 0.04* 0.03   BNP (last 3 results) No results for input(s): PROBNP in the last 8760 hours. CBG: No results for input(s): GLUCAP in the last 168 hours. D-Dimer: No results for input(s): DDIMER in the last 72 hours. Hgb A1c: No results for input(s): HGBA1C in the last 72 hours. Lipid Profile: No results for  input(s): CHOL, HDL, LDLCALC, TRIG, CHOLHDL, LDLDIRECT in the last 72 hours. Thyroid function studies: No results for input(s): TSH, T4TOTAL, T3FREE, THYROIDAB in the last 72 hours.  Invalid input(s): FREET3 Anemia work up: No results for input(s): VITAMINB12, FOLATE, FERRITIN, TIBC, IRON, RETICCTPCT in the last 72 hours. Sepsis Labs:  Recent Labs Lab 10/23/15 0230  WBC 5.3   Microbiology No results found for this or any previous visit (from the past 240 hour(s)).   Medications:   . aspirin EC  325 mg Oral Daily  . carvedilol  25 mg Oral BID WC  . chlorproMAZINE  10 mg Oral TID  . cholecalciferol  1,000 Units Oral Q supper  . enoxaparin (LOVENOX) injection  30 mg Subcutaneous Daily  . furosemide  80 mg Oral Daily  . hydrALAZINE  25 mg Oral 3 times per day  . latanoprost  1 drop Both Eyes QHS  . multivitamin with minerals  1 tablet Oral Daily  . pantoprazole  40 mg Oral Daily  . piperacillin-tazobactam (ZOSYN)  IV  2.25 g Intravenous 4 times per day  . sodium chloride  3 mL Intravenous Q12H  . sodium chloride  3 mL Intravenous Q12H  . tamsulosin  0.4 mg Oral QPC supper  . vancomycin  1,000 mg Intravenous Q48H   Continuous Infusions:   Time spent: 25 min   LOS: 1 day   Charlynne Cousins  [facility] Pager [phone]  *Please refer to [email or web address], password [email or web address]  to get updated schedule on who will round on this patient, as hospitalists switch teams weekly. If 7PM-7AM, please contact night-coverage at [email or web address], password [email or web address] for any overnight needs.  10/24/2015, 9:01 AM

## 2015-10-24 NOTE — Evaluation (Signed)
Clinical/Bedside Swallow Evaluation Patient Details  Name: Janari Mandella MRN: [id number] Date of Birth: [birth year]  Today's Date: 10/24/2015 Time: SLP Start Time (ACUTE ONLY): 83 SLP Stop Time (ACUTE ONLY): 1118 SLP Time Calculation (min) (ACUTE ONLY): 15 min  Past Medical History:  Past Medical History  Diagnosis Date  . History of colon cancer     s/p colon resection  . Aortic stenosis     a. s/p AVR with 56mm Edwards pericardial valve 02/07/12 - post-op course complicated by pleural effusion requring thoracentesis/leg cellulitis 03/2012.  Marland Kitchen CAD (coronary artery disease)     a. s/p NSTEMI 12/2011:  LHC - Ostial left main 20%, ostial LAD 50%, mid 60-70%, ostial D1 40% and mid 40%, D2 70%, ostial circumflex occluded, ostial RCA 80-90%, LVEDP was 42. b.  s/p CABG x 3 at time of AVR (LiMA-LAD, SVG-2nd daigonal, SVG-PDA) 02/07/12 (post-op course noted above).  . Ischemic cardiomyopathy   . Chronic systolic heart failure ([place])     a. TEE 01/2012: EF 25-30%, diffuse hypokinesis. b. Not on ACEI due to renal insufficiency.;  c. follow up  echo 08/06/12: EF 25%, mod diast dysfxn, AVR ok, mild MR, mod LAE, mild RAE, mild to mod RV systolic dysfunction  . HLD (hyperlipidemia)   . GERD (gastroesophageal reflux disease)   . Chronic kidney disease   . Pleural effusion     a. post-operatively after AVR/CABG s/p thoracentesis 03/2012 yielding 1L serosanguinous fluid.  . Diabetes mellitus     borderline  . Blood transfusion     NO REACTION TO TRANSFUSION  . Cellulitis     a. RLE cellulitis 2 months post-operatively after AVR/CABG - serratia marcessans, tx with I&D/antibiotics  . Mitral regurgitation     Moderate by TEE 01/2012  . Flash pulmonary edema ([place])     Post-cath 12/2011, went into acute pulm edema requiring IV lasix and intubation  . Baker's cyst 07/23/12    Incidental finding of LE venous dopplers  . Cataract     right eye, hx of  . HTN (hypertension)     primary, Dr. Hollace Kinnier  .  Myocardial infarction ([employer]) 12/2011  . CHF (congestive heart failure) ([place])   . Stroke ([place]) 10/01    RIght Leg weakness  . Cancer [facility]) '90's    Colon  . Anemia   . Anxiety    Past Surgical History:  Past Surgical History  Procedure Laterality Date  . Colon resection  1996  . Esophageal dilation    . Colonoscopy    . Cataract extraction      rt  . Cardiac catheterization      1.3.13  stopped breathing, put on ventilator for 5-6 days  . Tonsillectomy    . Aortic valve replacement  02/07/2012    Procedure: AORTIC VALVE REPLACEMENT (AVR);  Surgeon: Gaye Pollack, MD;  Location: [place];  Service: Open Heart Surgery;  Laterality: N/A;  . Coronary artery bypass graft  02/07/2012    Procedure: CORONARY ARTERY BYPASS GRAFTING (CABG);  Surgeon: Gaye Pollack, MD;  Location: [place];  Service: Open Heart Surgery;  Laterality: N/A;  CABG x three; using right leg greater saphenous vein harvested endoscopically  . Chest tube insertion  07/24/2012    Procedure: INSERTION PLEURAL DRAINAGE CATHETER;  Surgeon: Gaye Pollack, MD;  Location: [place];  Service: Thoracic;  Laterality: Left;  . Talc pleurodesis  08/30/2012    Procedure: TALC PLEURADESIS;  Surgeon: Gaye Pollack, MD;  Location: [facility]  OR;  Service: Thoracic;  Laterality: Left;  INSERTION OF TALC VIA LEFT PLEURX  . Talc pleurodesis  09/12/2012    Procedure: TALC PLEURADESIS;  Surgeon: Gaye Pollack, MD;  Location: [place];  Service: Thoracic;  Laterality: Left;  . Removal of pleural drainage catheter  09/27/2012    Procedure: REMOVAL OF PLEURAL DRAINAGE CATHETER;  Surgeon: Gaye Pollack, MD;  Location: [place];  Service: Thoracic;  Laterality: Left;  Marland Kitchen Eye surgery  2009    cataract removed from right eye  . Esophagogastroduodenoscopy N/A 07/03/2014    Procedure: ESOPHAGOGASTRODUODENOSCOPY (EGD);  Surgeon: Arta Silence, MD;  Location: [facility] ENDOSCOPY;  Service: Endoscopy;  Laterality: N/A;  . Esophagoscopy with dilitation N/A 07/07/2014    Procedure:  ESOPHAGOSCOPY WITH DILITATION/SAVARY DILATOR;  Surgeon: Rozetta Nunnery, MD;  Location: [place];  Service: ENT;  Laterality: N/A;  . Ercp N/A 07/08/2014    Procedure: ENDOSCOPIC RETROGRADE CHOLANGIOPANCREATOGRAPHY (ERCP);  Surgeon: Missy Sabins, MD;  Location: [facility] ENDOSCOPY;  Service: Endoscopy;  Laterality: N/A;  . Left heart catheterization with coronary angiogram N/A 12/13/2011    Procedure: LEFT HEART CATHETERIZATION WITH CORONARY ANGIOGRAM;  Surgeon: Peter M Martinique, MD;  Location: [facility] CATH LAB;  Service: Cardiovascular;  Laterality: N/A;  . Inguinal hernia repair Right 09/13/2015    Procedure: OPEN RIGHT INGUINAL HERNIA;  Surgeon: Mickeal Skinner, MD;  Location: [place];  Service: General;  Laterality: Right;  . Insertion of mesh Right 09/13/2015    Procedure: INSERTION OF MESH;  Surgeon: Arta Bruce Kinsinger, MD;  Location: [place];  Service: General;  Laterality: Right;   HPI:  Ignac Eckstein is an 81 y.o. male past medical history of CABG, chronic systolic heart failure, status post aVR, diabetes of 2 and chronic renal disease presents to the ED was given onset of chest pain with deep breath and shortness of breath without nitroglycerin relief. Pt with h/o mild oropharyngeal dysphagia and esophageal dilation wtih most recent MBS 09/2015 with penetration of thin and nectar thick liquids which was reduced wtih strong throat clear psot-swallow   Assessment / Plan / Recommendation Clinical Impression  Pt has one instance of wet vocal quality following challenging with large, consecutive straw sips of water. This returned to baseline with throat clear, and is consistent with MBS completed in 09/2015, at which time penetration was noted with thin and nectar thick liquids. Would continue with recommendations from that time: regular diet and thin liquids with throat clear to expel penetrates. Will follow for utilization of strategies, diet tolerance, and/or possible need for modifications.     Aspiration Risk  Mild aspiration risk    Diet Recommendation  Regular diet, thin liquids Cup and straw okay Throat clear after sips of liquids   Medication Administration: Crushed with puree    Other  Recommendations Oral Care Recommendations: Oral care BID   Follow up Recommendations  Home health SLP    Frequency and Duration min 2x/week  2 weeks       Swallow Study   General HPI: Svanik Geiser is an 81 y.o. male past medical history of CABG, chronic systolic heart failure, status post aVR, diabetes of 2 and chronic renal disease presents to the ED was given onset of chest pain with deep breath and shortness of breath without nitroglycerin relief. Pt with h/o mild oropharyngeal dysphagia and esophageal dilation wtih most recent MBS 09/2015 with penetration of thin and nectar thick liquids which was reduced wtih strong throat clear psot-swallow Type of Study:  Bedside Swallow Evaluation Previous Swallow Assessment: see HPI Diet Prior to this Study: Regular;Thin liquids Temperature Spikes Noted: No Respiratory Status: Room air History of Recent Intubation: No Behavior/Cognition: Alert;Cooperative;Pleasant mood Oral Cavity Assessment: Within Functional Limits Oral Care Completed by SLP: No Oral Cavity - Dentition: Adequate natural dentition Vision: Functional for self-feeding Self-Feeding Abilities: Able to feed self Patient Positioning: Upright in chair Baseline Vocal Quality: Normal    Oral/Motor/Sensory Function Overall Oral Motor/Sensory Function: Within functional limits   Ice Chips Ice chips: Not tested   Thin Liquid Thin Liquid: Impaired Presentation: Cup;Self Fed;Straw Pharyngeal  Phase Impairments: Suspected delayed Swallow;Wet Vocal Quality    Nectar Thick Nectar Thick Liquid: Not tested   Honey Thick Honey Thick Liquid: Not tested   Puree Puree: Within functional limits Presentation: Self Fed;Spoon   Solid Solid: Within functional limits Presentation: Self  Fed      Germain Osgood, M.A. CCC-SLP [phone]  Germain Osgood 10/24/2015,11:29 AM

## 2015-10-25 ENCOUNTER — Telehealth: Payer: Self-pay | Admitting: *Deleted

## 2015-10-25 LAB — GLUCOSE, CAPILLARY
GLUCOSE-CAPILLARY: 111 mg/dL — AB (ref 65–99)
GLUCOSE-CAPILLARY: 131 mg/dL — AB (ref 65–99)
GLUCOSE-CAPILLARY: 117 mg/dL — AB (ref 65–99)
Glucose-Capillary: 108 mg/dL — ABNORMAL HIGH (ref 65–99)

## 2015-10-25 LAB — HEMOGLOBIN A1C
Hgb A1c MFr Bld: 6.4 % — ABNORMAL HIGH (ref 4.8–5.6)
MEAN PLASMA GLUCOSE: 137 mg/dL

## 2015-10-25 MED ORDER — LEVOFLOXACIN 500 MG PO TABS
500.0000 mg | ORAL_TABLET | ORAL | Status: DC
  Administered 2015-10-25: 500 mg via ORAL
  Filled 2015-10-25: qty 1

## 2015-10-25 NOTE — Consult Note (Signed)
   [facility] CM Inpatient Consult   10/25/2015  [name] [name] [birth year] [id number] Referral received to assess for care management services. Patient was evaluated for services.  Met with the patient regarding the benefits of [place] [facility] services. Explained that [place] [facility] is a covered benefit of insurance.  Review information for [facility] Care Management services.  Explained that [place] Management does not interfere with or replace any services arranged by the inpatient care management staff.  Patient declined services with [place] [facility] stating, "I have folks checking on me when I get back home and after they finish I think I really won't need anything extra."  Patient accepted a brochure with contact information provided.  For questions, please contact: Natividad Brood, RN BSN [facility] Liaison  [phone] business mobile phone

## 2015-10-25 NOTE — Telephone Encounter (Signed)
Kim with [facility] called wanting a verbal order for Speech therapy. Verbal Given.

## 2015-10-25 NOTE — Progress Notes (Signed)
[facility] PROGRESS NOTE    Progress Note   Darragh Gruenwald [id number] DOB: [birth year] DOA: 10/22/2015 PCP: Hollace Kinnier, DO   Brief Narrative:   Rajat Bagent is an 81 y.o. male past medical history of CABG, chronic systolic heart failure, status post aVR, diabetes of 2 and chronic renal disease presents to the ED was given onset of chest pain with deep breath and shortness of breath without nitroglycerin relief.  Assessment/Plan:   HCAP (healthcare-associated pneumonia) - Started empirically on vancomycin and Fortaz on 10/23/2015. - Has remained afebrile, Deescalated to Levaquin - He refuses to go to skilled nursing facility.  Essential hypertension: - Continue Coreg and hydralazine, blood pressure is at goal.  Anemia due to chronic kidney disease: Hemoglobin is stable.  Chronic combined systolic and diastolic heart failure, NYHA class 2 (HCC) Seems to be euvolemic.  Chronic kidney disease, stage III (moderate): Cr ~2.2 baseline Creatinine at baseline.  Diabetes mellitus type 2, controlled, with complications ([place]) Blood glucose habits improved since admission. Resume glimepiride continue check CBGs before meals and at bedtime     DVT Prophylaxis - Lovenox ordered.  Family Communication: wife Disposition Plan: Home when stable. Code Status:     Code Status Orders        Start     Ordered   10/23/15 0111  Full code   Continuous     10/23/15 0110    Advance Directive Documentation        Most Recent Value   Type of Advance Directive  Living will   Pre-existing out of facility DNR order (yellow form or pink MOST form)     "MOST" Form in Place?          IV Access:    Peripheral IV   Procedures and diagnostic studies:   No results found.   Medical Consultants:    None.  Anti-Infectives:   Anti-infectives    Start     Dose/Rate Route Frequency Ordered Stop   10/24/15 2300  vancomycin (VANCOCIN) IVPB 1000 mg/200 mL premix       1,000 mg 200 mL/hr over 60 Minutes Intravenous Every 48 hours 10/23/15 1536     10/24/15 1600  cefTAZidime (FORTAZ) 1 g in dextrose 5 % 50 mL IVPB     1 g 100 mL/hr over 30 Minutes Intravenous Every 24 hours 10/24/15 0903     10/23/15 1600  piperacillin-tazobactam (ZOSYN) IVPB 2.25 g  Status:  Discontinued     2.25 g 100 mL/hr over 30 Minutes Intravenous 4 times per day 10/23/15 1536 10/24/15 0903   10/22/15 2315  vancomycin (VANCOCIN) 1,250 mg in sodium chloride 0.9 % 250 mL IVPB     1,250 mg 166.7 mL/hr over 90 Minutes Intravenous  Once 10/22/15 2304 10/23/15 0122   10/22/15 2315  piperacillin-tazobactam (ZOSYN) IVPB 3.375 g     3.375 g 100 mL/hr over 30 Minutes Intravenous  Once 10/22/15 2304 10/23/15 0022      Subjective:    Beckie Salts afebrile feels back to baseline.  Objective:    Filed Vitals:   10/24/15 1300 10/24/15 2114 10/25/15 0347 10/25/15 0549  BP: 143/45 159/55 134/41 149/54  Pulse: 59 62 58   Temp: 97.5 F (36.4 C) 97.9 F (36.6 C) 98.8 F (37.1 C)   TempSrc: Oral Oral Oral   Resp: 18 20 18    Height:      Weight:   65.953 kg (145 lb 6.4 oz)   SpO2: 99% 96% 96%  Intake/Output Summary (Last 24 hours) at 10/25/15 1023 Last data filed at 10/25/15 0859  Gross per 24 hour  Intake   1090 ml  Output    950 ml  Net    140 ml   Filed Weights   10/22/15 2101 10/24/15 0530 10/25/15 0347  Weight: 67.132 kg (148 lb) 65.137 kg (143 lb 9.6 oz) 65.953 kg (145 lb 6.4 oz)    Exam: Gen:  NAD Cardiovascular:  RRR. Chest and lungs:   CTAB Abdomen:  Abdomen soft, NT/ND, + BS Extremities:  No C/E/C   Data Reviewed:    Labs: Basic Metabolic Panel:  Recent Labs Lab 10/22/15 2200 10/22/15 2207 10/23/15 0230  NA 136 138 138  K 3.9 3.9 3.6  CL 100* 100* 101  CO2 25  --  26  GLUCOSE 183* 184* 102*  BUN 48* 49* 48*  CREATININE 2.68* 2.80* 2.63*  CALCIUM 8.9  --  9.0  MG 2.3  --   --    GFR Estimated Creatinine Clearance: 18.5 mL/min (by  C-G formula based on Cr of 2.63). Liver Function Tests:  Recent Labs Lab 10/22/15 2200  AST 27  ALT 16*  ALKPHOS 54  BILITOT 0.7  PROT 6.6  ALBUMIN 3.0*   No results for input(s): LIPASE, AMYLASE in the last 168 hours. No results for input(s): AMMONIA in the last 168 hours. Coagulation profile No results for input(s): INR, PROTIME in the last 168 hours.  CBC:  Recent Labs Lab 10/22/15 2207 10/23/15 0230  WBC  --  5.3  HGB 9.5* 9.1*  HCT 28.0* 28.2*  MCV  --  98.9  PLT  --  143*   Cardiac Enzymes:  Recent Labs Lab 10/23/15 0230 10/23/15 0754 10/23/15 1450  TROPONINI 0.03 0.04* 0.03   BNP (last 3 results) No results for input(s): PROBNP in the last 8760 hours. CBG:  Recent Labs Lab 10/24/15 2113 10/25/15 0552  GLUCAP 182* 111*   D-Dimer: No results for input(s): DDIMER in the last 72 hours. Hgb A1c: No results for input(s): HGBA1C in the last 72 hours. Lipid Profile: No results for input(s): CHOL, HDL, LDLCALC, TRIG, CHOLHDL, LDLDIRECT in the last 72 hours. Thyroid function studies: No results for input(s): TSH, T4TOTAL, T3FREE, THYROIDAB in the last 72 hours.  Invalid input(s): FREET3 Anemia work up: No results for input(s): VITAMINB12, FOLATE, FERRITIN, TIBC, IRON, RETICCTPCT in the last 72 hours. Sepsis Labs:  Recent Labs Lab 10/23/15 0230  WBC 5.3   Microbiology No results found for this or any previous visit (from the past 240 hour(s)).   Medications:   . aspirin EC  325 mg Oral Daily  . carvedilol  25 mg Oral BID WC  . cefTAZidime (FORTAZ)  IV  1 g Intravenous Q24H  . cholecalciferol  1,000 Units Oral Q supper  . enoxaparin (LOVENOX) injection  30 mg Subcutaneous Daily  . furosemide  80 mg Oral Daily  . glimepiride  1 mg Oral Q breakfast  . hydrALAZINE  25 mg Oral 3 times per day  . isosorbide mononitrate  60 mg Oral Daily  . latanoprost  1 drop Both Eyes QHS  . multivitamin with minerals  1 tablet Oral Daily  . pantoprazole  40  mg Oral Daily  . sodium chloride  3 mL Intravenous Q12H  . sodium chloride  3 mL Intravenous Q12H  . tamsulosin  0.4 mg Oral QPC supper  . vancomycin  1,000 mg Intravenous Q48H   Continuous Infusions:  Time spent: 25 min   LOS: 2 days   Charlynne Cousins  [facility] Pager [phone]  *Please refer to [email or web address], password [email or web address] to get updated schedule on who will round on this patient, as hospitalists switch teams weekly. If 7PM-7AM, please contact night-coverage at [email or web address], password [email or web address] for any overnight needs.  10/25/2015, 10:23 AM

## 2015-10-26 LAB — GLUCOSE, CAPILLARY: GLUCOSE-CAPILLARY: 101 mg/dL — AB (ref 65–99)

## 2015-10-26 MED ORDER — LEVOFLOXACIN 500 MG PO TABS: 500.0000 mg | ORAL_TABLET | ORAL | Status: DC

## 2015-10-26 NOTE — Progress Notes (Signed)
Speech Language Pathology Treatment: Dysphagia  Patient Details Name: [name] [name] MRN: [id number] DOB: 13-Feb-1926 Today's Date: 10/26/2015 Time: [id number] SLP Time Calculation (min) (ACUTE ONLY): 13 min  Assessment / Plan / Recommendation Clinical Impression  Pt initially unable to recall swallow precaution for clear throat after every other bite/sip to remove possible unsensed penetration. Moderate verbal and demonstration cues provided faded out to min during session. No indications of penetration or aspiration. Wife present and educated; written cue placed in room. Possible discharge today. Continue regular texture and thin liquid and may benefit from home health ST.   HPI HPI: [name] [name] is an 81 y.o. male past medical history of CABG, chronic systolic heart failure, status post aVR, diabetes of 2 and chronic renal disease presents to the ED was given onset of chest pain with deep breath and shortness of breath without nitroglycerin relief. Pt with h/o mild oropharyngeal dysphagia and esophageal dilation wtih most recent MBS 09/2015 with penetration of thin and nectar thick liquids which was reduced wtih strong throat clear psot-swallow      SLP Plan  Continue with current plan of care     Recommendations  Diet recommendations: Regular;Thin liquid Liquids provided via: Cup;Straw Medication Administration: Whole meds with puree Supervision: Patient able to self feed;Intermittent supervision to cue for compensatory strategies Compensations: Clear throat intermittently;Small sips/bites;Slow rate Postural Changes and/or Swallow Maneuvers: Seated upright 90 degrees              Oral Care Recommendations: Oral care BID Follow up Recommendations: Home health SLP Plan: Continue with current plan of care   Houston Siren 10/26/2015, 9:02 AM  [phone]

## 2015-10-26 NOTE — Care Management Important Message (Signed)
Important Message  Patient Details  Name: [name] [name] MRN: [id number] Date of Birth: [birth year]   Medicare Important Message Given:  Yes    Macdonald Rigor P Nariya Neumeyer 10/26/2015, 12:02 PM

## 2015-10-26 NOTE — Progress Notes (Signed)
Pt has orders to be discharged. Discharge instructions given and pt has no additional questions at this time. Medication regimen reviewed and pt educated. Pt verbalized understanding and has no additional questions. Telemetry box removed. IV removed and site in good condition. Pt stable and waiting for transportation.   Alferd Obryant RN 

## 2015-10-26 NOTE — Progress Notes (Signed)
Utilization review completed. Hinton Luellen, RN, BSN. 

## 2015-10-26 NOTE — Discharge Summary (Signed)
Physician Discharge Summary  [name] [name] [id number] DOB: Apr 09, 1926 DOA: 10/22/2015  PCP: Hollace Kinnier, DO  Admit date: 10/22/2015 Discharge date: 10/26/2015  Time spent: 35 minutes  Recommendations for Outpatient Follow-up:  1. Follow up with PCP in 2-4 weeks, will need a CXR  At this time  Discharge Diagnoses:  Principal Problem:   HCAP (healthcare-associated pneumonia) Active Problems:   Essential hypertension   Anemia due to chronic kidney disease   Chronic combined systolic and diastolic heart failure, NYHA class 2 (HCC)   Chronic kidney disease, stage III (moderate): Cr ~2.2 baseline   Diabetes mellitus type 2, controlled, with complications [facility])   Pleuritic chest pain   Discharge Condition: Stable  Diet recommendation: carb modified  Filed Weights   10/24/15 0530 10/25/15 0347 10/26/15 0623  Weight: 65.137 kg (143 lb 9.6 oz) 65.953 kg (145 lb 6.4 oz) 65.427 kg (144 lb 3.8 oz)    History of present illness:  This was accommodated past medical history who recently had a umbilical hernia repair last month comes in for cough and fever that started after being that showed from the hospital.  Hospital Course:  Aspiration pneumonia: He was started empirically on IV vancomycin and Fortaz for healthcare associated pneumonia. Once he defervesces his antibiotic regimen was Deescalated Levaquin which she will continue for 5 more days. Evaluation was done that recommended a regular diet no restrictions.  Essential hypertension no changes were made.  Anemia of chronic disease: A single and has remained stable no changes were made.  Chronic Systolic heart failure: No changes were made to his medication.  Chronic kidney stage III: His creatinine remained at baseline which is 2.2.  Diabetes mellitus type 2 controlled with complications: No changes were made to his medications hemoglobin A1c was 6.1.    Procedures:  V/q scan no evidence of  PE.  Consultations:  None  Discharge Exam: Filed Vitals:   10/26/15 0623  BP:   Pulse: 56  Temp: 98.1 F (36.7 C)  Resp: 18    General: A&O x3 Cardiovascular: RRR Respiratory: good air movement CTA B/L  Discharge Instructions   Discharge Instructions    Diet - low sodium heart healthy    Complete by:  As directed      Increase activity slowly    Complete by:  As directed           Current Discharge Medication List    START taking these medications   Details  levofloxacin (LEVAQUIN) 500 MG tablet Take 1 tablet (500 mg total) by mouth every other day. Qty: 4 tablet, Refills: 0      CONTINUE these medications which have NOT CHANGED   Details  aspirin EC 325 MG tablet Take 325 mg by mouth daily.     carvedilol (COREG) 25 MG tablet TAKE 1 TABLET BY MOUTH TWICE A DAY WITH A MEAL Qty: 180 tablet, Refills: 0    chlorproMAZINE (THORAZINE) 10 MG tablet Take 10 mg by mouth 3 (three) times daily.    cholecalciferol (VITAMIN D) 1000 UNITS tablet Take 1,000 Units by mouth daily with supper.     furosemide (LASIX) 40 MG tablet Take 2 tablets (80 mg total) by mouth daily. Qty: 180 tablet, Refills: 1    glimepiride (AMARYL) 1 MG tablet Take 1 tablet (1 mg total) by mouth daily with breakfast. Qty: 30 tablet, Refills: 0    hydrALAZINE (APRESOLINE) 25 MG tablet TAKE 1 TABLET BY MOUTH 3 TIMES A DAY Qty: 270 tablet, Refills: 3  isosorbide mononitrate (IMDUR) 60 MG 24 hr tablet TAKE 1 TABLET BY MOUTH EVERY DAY Qty: 30 tablet, Refills: 6    Lancets (FREESTYLE) lancets Use to check blood sugar three times daily. Dx: E11.8 Qty: 100 each, Refills: 12    Multiple Vitamin (MULTIVITAMIN WITH MINERALS) TABS tablet Take 1 tablet by mouth daily.    nitroGLYCERIN (NITROSTAT) 0.4 MG SL tablet Place 0.4 mg under the tongue every 5 (five) minutes as needed for chest pain.    omeprazole (PRILOSEC) 20 MG capsule Take 20 mg by mouth daily.     tamsulosin (FLOMAX) 0.4 MG CAPS capsule  TAKE 1 CAPSULE EVERY DAY AFTER DINNER Qty: 30 capsule, Refills: 5    Travoprost, BAK Free, (TRAVATAN) 0.004 % SOLN ophthalmic solution Place 1 drop into both eyes daily.       STOP taking these medications     glucose blood (FREESTYLE LITE) test strip        Allergies  Allergen Reactions  . Statins Other (See Comments)    Pain/weakness in legs   Follow-up Information    Follow up with [name], [name], DO In 2 weeks.   Specialty:  Geriatric Medicine   Why:  hospital follow up   Contact information:   [address] [place] [address] [phone]        The results of significant diagnostics from this hospitalization (including imaging, microbiology, ancillary and laboratory) are listed below for reference.    Significant Diagnostic Studies: Nm Pulmonary Perf And Vent  10/23/2015  CLINICAL DATA:  Pt was imaged due to pleuritic chest pain. Chest pain started Friday night at 1900 hours. History of CAD. EXAM: NUCLEAR MEDICINE VENTILATION - PERFUSION LUNG SCAN TECHNIQUE: Ventilation images were obtained in multiple projections using inhaled aerosol Tc-30m DTPA. Perfusion images were obtained in multiple projections after intravenous injection of Tc-81m MAA. RADIOPHARMACEUTICALS:  30.5 Technetium-12m DTPA aerosol inhalation and 4.06 Technetium-54m MAA IV COMPARISON:  Chest x-ray 10/22/2015 FINDINGS: Ventilation: No focal ventilation defect. Perfusion: No wedge shaped peripheral perfusion defects to suggest acute pulmonary embolism. IMPRESSION: No evidence for acute pulmonary embolus. Electronically Signed   By: Nolon Nations M.D.   On: 10/23/2015 09:32   Dg Chest Portable 1 View  10/22/2015  CLINICAL DATA:  Acute onset of upper chest pain, worse with deep breaths. Initial encounter. EXAM: PORTABLE CHEST 1 VIEW COMPARISON:  Chest radiograph performed 09/20/2015 FINDINGS: The lungs are well-aerated. Mild patchy right midlung and left basilar airspace opacities raise question for mild  pneumonia. There is no evidence of pleural effusion or pneumothorax. The cardiomediastinal silhouette is mildly enlarged. The patient is status post median sternotomy. No acute osseous abnormalities are seen. IMPRESSION: 1. Mild patchy right mid lung and left basilar airspace opacities raise question for mild pneumonia. 2. Mild cardiomegaly. Electronically Signed   By: Garald Balding M.D.   On: 10/22/2015 21:41    Microbiology: No results found for this or any previous visit (from the past 240 hour(s)).   Labs: Basic Metabolic Panel:  Recent Labs Lab 10/22/15 2200 10/22/15 2207 10/23/15 0230  NA 136 138 138  K 3.9 3.9 3.6  CL 100* 100* 101  CO2 25  --  26  GLUCOSE 183* 184* 102*  BUN 48* 49* 48*  CREATININE 2.68* 2.80* 2.63*  CALCIUM 8.9  --  9.0  MG 2.3  --   --    Liver Function Tests:  Recent Labs Lab 10/22/15 2200  AST 27  ALT 16*  ALKPHOS 54  BILITOT 0.7  PROT 6.6  ALBUMIN 3.0*   No results for input(s): LIPASE, AMYLASE in the last 168 hours. No results for input(s): AMMONIA in the last 168 hours. CBC:  Recent Labs Lab 10/22/15 2207 10/23/15 0230  WBC  --  5.3  HGB 9.5* 9.1*  HCT 28.0* 28.2*  MCV  --  98.9  PLT  --  143*   Cardiac Enzymes:  Recent Labs Lab 10/23/15 0230 10/23/15 0754 10/23/15 1450  TROPONINI 0.03 0.04* 0.03   BNP: BNP (last 3 results)  Recent Labs  10/06/15 1630 10/22/15 2200  BNP 1338.4* 614.0*    ProBNP (last 3 results) No results for input(s): PROBNP in the last 8760 hours.  CBG:  Recent Labs Lab 10/25/15 0552 10/25/15 1127 10/25/15 1622 10/25/15 2151 10/26/15 0545  GLUCAP 111* 131* 117* 108* 101*     Signed:  Charlynne Cousins  [facility] 10/26/2015, 10:26 AM

## 2015-10-27 DIAGNOSIS — R2689 Other abnormalities of gait and mobility: Secondary | ICD-10-CM | POA: Diagnosis not present

## 2015-10-27 DIAGNOSIS — E1122 Type 2 diabetes mellitus with diabetic chronic kidney disease: Secondary | ICD-10-CM | POA: Diagnosis not present

## 2015-10-28 DIAGNOSIS — E1122 Type 2 diabetes mellitus with diabetic chronic kidney disease: Secondary | ICD-10-CM | POA: Diagnosis not present

## 2015-10-28 DIAGNOSIS — R2689 Other abnormalities of gait and mobility: Secondary | ICD-10-CM | POA: Diagnosis not present

## 2015-10-29 ENCOUNTER — Other Ambulatory Visit: Payer: Self-pay

## 2015-10-29 ENCOUNTER — Ambulatory Visit (INDEPENDENT_AMBULATORY_CARE_PROVIDER_SITE_OTHER): Payer: Medicare Other | Admitting: Internal Medicine

## 2015-10-29 ENCOUNTER — Encounter: Payer: Self-pay | Admitting: Internal Medicine

## 2015-10-29 VITALS — BP 122/68 | HR 60 | Temp 97.9°F | Wt 146.0 lb

## 2015-10-29 DIAGNOSIS — E1122 Type 2 diabetes mellitus with diabetic chronic kidney disease: Secondary | ICD-10-CM | POA: Diagnosis not present

## 2015-10-29 DIAGNOSIS — R2689 Other abnormalities of gait and mobility: Secondary | ICD-10-CM | POA: Diagnosis not present

## 2015-10-29 DIAGNOSIS — E118 Type 2 diabetes mellitus with unspecified complications: Secondary | ICD-10-CM

## 2015-10-29 DIAGNOSIS — I10 Essential (primary) hypertension: Secondary | ICD-10-CM

## 2015-10-29 DIAGNOSIS — R1310 Dysphagia, unspecified: Secondary | ICD-10-CM | POA: Diagnosis not present

## 2015-10-29 DIAGNOSIS — J189 Pneumonia, unspecified organism: Secondary | ICD-10-CM

## 2015-10-29 DIAGNOSIS — I5042 Chronic combined systolic (congestive) and diastolic (congestive) heart failure: Secondary | ICD-10-CM | POA: Diagnosis not present

## 2015-10-29 NOTE — Progress Notes (Signed)
Patient ID: [name] [name], male   DOB: 06/03/1926, 81 y.o.   MRN: [id number]   Location: [place] Provider: Rexene Edison. Mariea Clonts, D.O., C.M.D.  Code Status: DNR Goals of Care: Advanced Directive information Does patient have an advance directive?: Yes, Type of Advance Directive: Living will;Out of facility DNR (pink MOST or yellow form)  Chief Complaint  Patient presents with  . Hospitalization Follow-up    in hospital 10/22/15 to 10/26/15 for pneumonia. Here with wife    HPI: Patient is a 81 y.o. male seen in the office today for hospital f/u with pneumonia from 11/11 to 10/26/15.  He was treated with fluids and levaquin.  Three therapists came in to see him from home health the next day after his visit with me.  That evening at 7pm, had chest pain.  It was hard to breathe.  EMS called.  Cardiac workup was unremarkable. He went back to the hospital by ambulance.  Had recurrence of pneumonia.  Started on iv abx in ED.  Was hospitalized three days and got them IVs.  Went home on the  4 days of levaquin pills.  He has 3 left now.  Before he left the hospital, he was back to walking and felt at baseline. He is getting ST and clearing his throat between bites.  He is not coughing.  No sputum coming up.  No fever.  Never had wbc count or true fever, but appears baseline temp runs in the 97 range.  Review of Systems:  Review of Systems  Constitutional: Negative for fever, chills and malaise/fatigue.  HENT: Positive for hearing loss. Negative for congestion.   Eyes:       Glasses  Respiratory: Positive for shortness of breath. Negative for cough and wheezing.   Cardiovascular: Negative for chest pain, palpitations and leg swelling.  Genitourinary: Negative for dysuria.  Musculoskeletal: Negative for falls.  Neurological: Negative for dizziness, loss of consciousness and weakness.  Psychiatric/Behavioral: Positive for memory loss. Negative for depression.    Past Medical History    Diagnosis Date  . History of colon cancer     s/p colon resection  . Aortic stenosis     a. s/p AVR with 77mm Edwards pericardial valve 02/07/12 - post-op course complicated by pleural effusion requring thoracentesis/leg cellulitis 03/2012.  Marland Kitchen CAD (coronary artery disease)     a. s/p NSTEMI 12/2011:  LHC - Ostial left main 20%, ostial LAD 50%, mid 60-70%, ostial D1 40% and mid 40%, D2 70%, ostial circumflex occluded, ostial RCA 80-90%, LVEDP was 42. b.  s/p CABG x 3 at time of AVR (LiMA-LAD, SVG-2nd daigonal, SVG-PDA) 02/07/12 (post-op course noted above).  . Ischemic cardiomyopathy   . Chronic systolic heart failure ([place])     a. TEE 01/2012: EF 25-30%, diffuse hypokinesis. b. Not on ACEI due to renal insufficiency.;  c. follow up  echo 08/06/12: EF 25%, mod diast dysfxn, AVR ok, mild MR, mod LAE, mild RAE, mild to mod RV systolic dysfunction  . HLD (hyperlipidemia)   . GERD (gastroesophageal reflux disease)   . Chronic kidney disease   . Pleural effusion     a. post-operatively after AVR/CABG s/p thoracentesis 03/2012 yielding 1L serosanguinous fluid.  . Diabetes mellitus     borderline  . Blood transfusion     NO REACTION TO TRANSFUSION  . Cellulitis     a. RLE cellulitis 2 months post-operatively after AVR/CABG - serratia marcessans, tx with I&D/antibiotics  . Mitral regurgitation  Moderate by TEE 01/2012  . Flash pulmonary edema ([place])     Post-cath 12/2011, went into acute pulm edema requiring IV lasix and intubation  . Baker's cyst 07/23/12    Incidental finding of LE venous dopplers  . Cataract     right eye, hx of  . HTN (hypertension)     primary, Dr. Hollace Kinnier  . Myocardial infarction ([place]) 12/2011  . CHF (congestive heart failure) ([place])   . Stroke ([place]) 10/01    RIght Leg weakness  . Cancer [facility]) '90's    Colon  . Anemia   . Anxiety     Past Surgical History  Procedure Laterality Date  . Colon resection  1996  . Esophageal dilation    . Colonoscopy    . Cataract  extraction      rt  . Cardiac catheterization      1.3.13  stopped breathing, put on ventilator for 5-6 days  . Tonsillectomy    . Aortic valve replacement  02/07/2012    Procedure: AORTIC VALVE REPLACEMENT (AVR);  Surgeon: Gaye Pollack, MD;  Location: [facility];  Service: Open Heart Surgery;  Laterality: N/A;  . Coronary artery bypass graft  02/07/2012    Procedure: CORONARY ARTERY BYPASS GRAFTING (CABG);  Surgeon: Gaye Pollack, MD;  Location: [place];  Service: Open Heart Surgery;  Laterality: N/A;  CABG x three; using right leg greater saphenous vein harvested endoscopically  . Chest tube insertion  07/24/2012    Procedure: INSERTION PLEURAL DRAINAGE CATHETER;  Surgeon: Gaye Pollack, MD;  Location: [facility];  Service: Thoracic;  Laterality: Left;  . Talc pleurodesis  08/30/2012    Procedure: TALC PLEURADESIS;  Surgeon: Gaye Pollack, MD;  Location: [place];  Service: Thoracic;  Laterality: Left;  INSERTION OF TALC VIA LEFT PLEURX  . Talc pleurodesis  09/12/2012    Procedure: TALC PLEURADESIS;  Surgeon: Gaye Pollack, MD;  Location: [place];  Service: Thoracic;  Laterality: Left;  . Removal of pleural drainage catheter  09/27/2012    Procedure: REMOVAL OF PLEURAL DRAINAGE CATHETER;  Surgeon: Gaye Pollack, MD;  Location: [place];  Service: Thoracic;  Laterality: Left;  Marland Kitchen Eye surgery  2009    cataract removed from right eye  . Esophagogastroduodenoscopy N/A 07/03/2014    Procedure: ESOPHAGOGASTRODUODENOSCOPY (EGD);  Surgeon: Arta Silence, MD;  Location: [facility] ENDOSCOPY;  Service: Endoscopy;  Laterality: N/A;  . Esophagoscopy with dilitation N/A 07/07/2014    Procedure: ESOPHAGOSCOPY WITH DILITATION/SAVARY DILATOR;  Surgeon: Rozetta Nunnery, MD;  Location: [facility];  Service: ENT;  Laterality: N/A;  . Ercp N/A 07/08/2014    Procedure: ENDOSCOPIC RETROGRADE CHOLANGIOPANCREATOGRAPHY (ERCP);  Surgeon: Missy Sabins, MD;  Location[facility] ENDOSCOPY;  Service: Endoscopy;  Laterality: N/A;  . Left heart  catheterization with coronary angiogram N/A 12/13/2011    Procedure: LEFT HEART CATHETERIZATION WITH CORONARY ANGIOGRAM;  Surgeon: Peter M Martinique, MD;  Location: [facility] CATH LAB;  Service: Cardiovascular;  Laterality: N/A;  . Inguinal hernia repair Right 09/13/2015    Procedure: OPEN RIGHT INGUINAL HERNIA;  Surgeon: Mickeal Skinner, MD;  Location: [place];  Service: General;  Laterality: Right;  . Insertion of mesh Right 09/13/2015    Procedure: INSERTION OF MESH;  Surgeon: Mickeal Skinner, MD;  Location: [facility];  Service: General;  Laterality: Right;    Allergies  Allergen Reactions  . Statins Other (See Comments)    Pain/weakness in legs      Medication List  This list is accurate as of: 10/29/15 10:38 AM.  Always use your most recent med list.               aspirin EC 325 MG tablet  Take 325 mg by mouth daily.     carvedilol 25 MG tablet  Commonly known as:  COREG  TAKE 1 TABLET BY MOUTH TWICE A DAY WITH A MEAL     cholecalciferol 1000 UNITS tablet  Commonly known as:  VITAMIN D  Take 1,000 Units by mouth daily with supper.     freestyle lancets  Use to check blood sugar three times daily. Dx: E11.8     furosemide 40 MG tablet  Commonly known as:  LASIX  Take 2 tablets (80 mg total) by mouth daily.     glimepiride 1 MG tablet  Commonly known as:  AMARYL  Take 1 tablet (1 mg total) by mouth daily with breakfast.     hydrALAZINE 25 MG tablet  Commonly known as:  APRESOLINE  TAKE 1 TABLET BY MOUTH 3 TIMES A DAY     isosorbide mononitrate 60 MG 24 hr tablet  Commonly known as:  IMDUR  TAKE 1 TABLET BY MOUTH EVERY DAY     levofloxacin 500 MG tablet  Commonly known as:  LEVAQUIN  Take 1 tablet (500 mg total) by mouth every other day.     multivitamin with minerals Tabs tablet  Take 1 tablet by mouth daily.     nitroGLYCERIN 0.4 MG SL tablet  Commonly known as:  NITROSTAT  Place 0.4 mg under the tongue every 5 (five) minutes as needed for chest pain. As  needed     omeprazole 20 MG capsule  Commonly known as:  PRILOSEC  Take 20 mg by mouth daily.     tamsulosin 0.4 MG Caps capsule  Commonly known as:  FLOMAX  TAKE 1 CAPSULE EVERY DAY AFTER DINNER     Travoprost (BAK Free) 0.004 % Soln ophthalmic solution  Commonly known as:  TRAVATAN  Place 1 drop into both eyes daily.        Health Maintenance  Topic Date Due  . OPHTHALMOLOGY EXAM  01/28/1936  . PNA vac Low Risk Adult (2 of 2 - PCV13) 07/30/2014  . FOOT EXAM  02/15/2016  . HEMOGLOBIN A1C  04/22/2016  . INFLUENZA VACCINE  07/11/2016  . URINE MICROALBUMIN  10/20/2016  . TETANUS/TDAP  08/25/2025  . ZOSTAVAX  Completed    Physical Exam: Filed Vitals:   10/29/15 1033  BP: 122/68  Pulse: 60  Temp: 97.9 F (36.6 C)  TempSrc: Oral  Weight: 146 lb (66.225 kg)  SpO2: 96%   Body mass index is 22.2 kg/(m^2). Physical Exam  Constitutional: He is oriented to person, place, and time. No distress.  HENT:  Head: Normocephalic and atraumatic.  Cardiovascular: Normal rate, regular rhythm, normal heart sounds and intact distal pulses.   Pulmonary/Chest: Effort normal and breath sounds normal. No respiratory distress.  Coarse rhonchi remain on posterior lung exam  Abdominal: Soft. Bowel sounds are normal. He exhibits no distension. There is no tenderness.  Musculoskeletal: Normal range of motion. He exhibits no edema or tenderness.  Neurological: He is alert and oriented to person, place, and time.  Skin: Skin is warm and dry.  Psychiatric: He has a normal mood and affect.    Labs reviewed: Basic Metabolic Panel:  Recent Labs  09/20/15 1550  10/06/15 1630 10/22/15 2200 10/22/15 2207 10/23/15 0230  NA  --   < >  140 136 138 138  K  --   < > 4.5 3.9 3.9 3.6  CL  --   < > 103 100* 100* 101  CO2  --   < > 26 25  --  26  GLUCOSE  --   < > 128* 183* 184* 102*  BUN  --   < > 37* 48* 49* 48*  CREATININE  --   < > 2.05* 2.68* 2.80* 2.63*  CALCIUM  --   < > 9.0 8.9  --  9.0    MG  --   --   --  2.3  --   --   PHOS 2.7  --   --   --   --   --   < > = values in this interval not displayed. Liver Function Tests:  Recent Labs  02/15/15 1539 09/21/15 0401 10/22/15 2200  AST 15 26 27   ALT 11 19 16*  ALKPHOS 66 65 54  BILITOT 0.4 1.6* 0.7  PROT 6.8 6.1* 6.6  ALBUMIN 4.0 2.4* 3.0*   No results for input(s): LIPASE, AMYLASE in the last 8760 hours. No results for input(s): AMMONIA in the last 8760 hours. CBC:  Recent Labs  08/11/15 1326  09/20/15 0546  09/23/15 0429 10/06/15 1630 10/22/15 2207 10/23/15 0230  WBC 6.1  < > 8.6  < > 8.0 6.0  --  5.3  NEUTROABS 3.4  --  6.8  --  6.3  --   --   --   HGB 12.2*  < > 7.4*  < > 9.6* 9.5* 9.5* 9.1*  HCT 36.8*  < > 23.3*  < > 29.8* 29.4* 28.0* 28.2*  MCV 98.4*  < > 101.3*  < > 99.3 99.3  --  98.9  PLT 135*  < > 180  < > 195 248  --  143*  < > = values in this interval not displayed. Lipid Panel: No results for input(s): CHOL, HDL, LDLCALC, TRIG, CHOLHDL, LDLDIRECT in the last 8760 hours. Lab Results  Component Value Date   HGBA1C 6.4* 10/24/2015   PCXR:  1. Mild patchy right mid lung and left basilar airspace opacities raise question for mild pneumonia. 2. Mild cardiomegaly.  Assessment/Plan 1. HCAP (healthcare-associated pneumonia) - will f/u xray at 3 wks -complete levaquin as planned -is symptomatically improved vs. At hospital and even since he's been home - advised to use incentive spirometry to prevent atelectasis  - DG Chest 2 View; Future -eating and drinking well and temp is down, never had wbc count -still rhonchorous on exam, but levaquin not yet finished -getting home health PT, OT, ST  2. Dysphagia - cont ST at home and clearing between bites - DG Chest 2 View; Future  3. Chronic combined systolic and diastolic heart failure, NYHA class 2 (HCC) - stable, continue daily weights at home which have been stable, intake is good -advised 1500cc fluid/day - DG Chest 2 View; Future  4.  Controlled type 2 diabetes mellitus with complication, without long-term current use of insulin (HCC) -resume daily cbgs -hba1c has crept up--suspect stress from surgery followed by pneumonia and chf exacerbation -cont current regimen and f/u labs as usual routine  5. Essential hypertension -bp is at goal with current regimen, cont same  Labs/tests ordered:   Orders Placed This Encounter  Procedures  . DG Chest 2 View    Standing Status: Future     Number of Occurrences:      Standing Expiration  Date: 12/28/2016    Order Specific Question:  Reason for Exam (SYMPTOM  OR DIAGNOSIS REQUIRED)    Answer:  follow up on pneumonia    Order Specific Question:  Preferred imaging location?    Answer:  [facility]    Next appt:  About 1 month for f/u on pneumonia  [name] [name] L. Jasai Sorg, D.O. [facility] [address], [place] [address] Cell Phone (Mon-Fri 8am-5pm):  [phone] On Call:  [phone] & follow prompts after 5pm & weekends Office Phone:  [phone] Office Fax:  [phone]

## 2015-11-01 DIAGNOSIS — E1122 Type 2 diabetes mellitus with diabetic chronic kidney disease: Secondary | ICD-10-CM | POA: Diagnosis not present

## 2015-11-01 DIAGNOSIS — R2689 Other abnormalities of gait and mobility: Secondary | ICD-10-CM | POA: Diagnosis not present

## 2015-11-02 DIAGNOSIS — E1122 Type 2 diabetes mellitus with diabetic chronic kidney disease: Secondary | ICD-10-CM | POA: Diagnosis not present

## 2015-11-02 DIAGNOSIS — R2689 Other abnormalities of gait and mobility: Secondary | ICD-10-CM | POA: Diagnosis not present

## 2015-11-03 DIAGNOSIS — E1122 Type 2 diabetes mellitus with diabetic chronic kidney disease: Secondary | ICD-10-CM | POA: Diagnosis not present

## 2015-11-03 DIAGNOSIS — R2689 Other abnormalities of gait and mobility: Secondary | ICD-10-CM | POA: Diagnosis not present

## 2015-11-05 DIAGNOSIS — E1122 Type 2 diabetes mellitus with diabetic chronic kidney disease: Secondary | ICD-10-CM | POA: Diagnosis not present

## 2015-11-05 DIAGNOSIS — R2689 Other abnormalities of gait and mobility: Secondary | ICD-10-CM | POA: Diagnosis not present

## 2015-11-09 DIAGNOSIS — E1122 Type 2 diabetes mellitus with diabetic chronic kidney disease: Secondary | ICD-10-CM | POA: Diagnosis not present

## 2015-11-09 DIAGNOSIS — R2689 Other abnormalities of gait and mobility: Secondary | ICD-10-CM | POA: Diagnosis not present

## 2015-11-10 ENCOUNTER — Ambulatory Visit (HOSPITAL_BASED_OUTPATIENT_CLINIC_OR_DEPARTMENT_OTHER): Payer: Medicare Other | Admitting: Oncology

## 2015-11-10 ENCOUNTER — Other Ambulatory Visit (HOSPITAL_BASED_OUTPATIENT_CLINIC_OR_DEPARTMENT_OTHER): Payer: Medicare Other

## 2015-11-10 ENCOUNTER — Telehealth: Payer: Self-pay | Admitting: Oncology

## 2015-11-10 ENCOUNTER — Ambulatory Visit: Payer: Medicare Other

## 2015-11-10 VITALS — BP 161/56 | HR 57 | Temp 98.2°F | Resp 18 | Ht 68.0 in | Wt 147.4 lb

## 2015-11-10 DIAGNOSIS — I2510 Atherosclerotic heart disease of native coronary artery without angina pectoris: Secondary | ICD-10-CM

## 2015-11-10 DIAGNOSIS — I10 Essential (primary) hypertension: Secondary | ICD-10-CM

## 2015-11-10 DIAGNOSIS — N189 Chronic kidney disease, unspecified: Principal | ICD-10-CM

## 2015-11-10 DIAGNOSIS — D631 Anemia in chronic kidney disease: Secondary | ICD-10-CM

## 2015-11-10 DIAGNOSIS — D696 Thrombocytopenia, unspecified: Secondary | ICD-10-CM | POA: Diagnosis not present

## 2015-11-10 DIAGNOSIS — D649 Anemia, unspecified: Secondary | ICD-10-CM

## 2015-11-10 DIAGNOSIS — N289 Disorder of kidney and ureter, unspecified: Secondary | ICD-10-CM

## 2015-11-10 LAB — COMPREHENSIVE METABOLIC PANEL (CC13)
ALBUMIN: 3.3 g/dL — AB (ref 3.5–5.0)
ALK PHOS: 50 U/L (ref 40–150)
ALT: 9 U/L (ref 0–55)
AST: 18 U/L (ref 5–34)
Anion Gap: 8 mEq/L (ref 3–11)
BUN: 40.2 mg/dL — AB (ref 7.0–26.0)
CALCIUM: 9.3 mg/dL (ref 8.4–10.4)
CO2: 29 mEq/L (ref 22–29)
CREATININE: 2.2 mg/dL — AB (ref 0.7–1.3)
Chloride: 101 mEq/L (ref 98–109)
EGFR: 26 mL/min/{1.73_m2} — ABNORMAL LOW (ref >90)
Glucose: 123 mg/dl (ref 70–140)
Potassium: 4.2 mEq/L (ref 3.5–5.1)
Sodium: 138 mEq/L (ref 136–145)
TOTAL PROTEIN: 7 g/dL (ref 6.4–8.3)
Total Bilirubin: 0.48 mg/dL (ref 0.20–1.20)

## 2015-11-10 LAB — CBC WITH DIFFERENTIAL/PLATELET
BASO%: 0.2 % (ref 0.0–2.0)
BASOS ABS: 0 10*3/uL (ref 0.0–0.1)
EOS%: 2.2 % (ref 0.0–7.0)
Eosinophils Absolute: 0.1 10*3/uL (ref 0.0–0.5)
HEMATOCRIT: 33.3 % — AB (ref 38.4–49.9)
HEMOGLOBIN: 10.9 g/dL — AB (ref 13.0–17.1)
LYMPH#: 2.2 10*3/uL (ref 0.9–3.3)
LYMPH%: 36.4 % (ref 14.0–49.0)
MCH: 32.7 pg (ref 27.2–33.4)
MCHC: 32.7 g/dL (ref 32.0–36.0)
MCV: 100 fL — ABNORMAL HIGH (ref 79.3–98.0)
MONO#: 0.4 10*3/uL (ref 0.1–0.9)
MONO%: 7.1 % (ref 0.0–14.0)
NEUT#: 3.3 10*3/uL (ref 1.5–6.5)
NEUT%: 54.1 % (ref 39.0–75.0)
Platelets: 127 10*3/uL — ABNORMAL LOW (ref 140–400)
RBC: 3.33 10*6/uL — ABNORMAL LOW (ref 4.20–5.82)
RDW: 14.3 % (ref 11.0–14.6)
WBC: 6 10*3/uL (ref 4.0–10.3)

## 2015-11-10 NOTE — Telephone Encounter (Signed)
per pof to sch pt appt-gave pt copy of avs °

## 2015-11-10 NOTE — Progress Notes (Signed)
Hematology and Oncology Follow Up Visit  [name] [name] [id number] 1926-10-20 81 y.o. 11/10/2015 3:02 PM [name] [name], DOReed, [name] L, DO   Principle Diagnosis: 81 year old gentleman with anemia that is multifactorial likely due to chronic disease and renal insufficiency  Current therapy: Aranesp 300 mcg every 4 weeks for a hemoglobin less than 10. He have not received an Aranesp injection recently. He had been on observation surveillance.  Interim History:  [name] returns for routine followup with his wife. Since the last visit, he was hospitalized on 2 separate occasions for aspiration pneumonia most recent of which in November 2016. Since his recent discharge, he has felt reasonably better. His breathing is improved and he is getting stronger. His appetite have normalized.   His hospitalization, his hemoglobin remained stable without any need for packed red cell transfusions.  Does not report any headaches, blurry vision, syncope or seizures. Has not reported any bleeding complications. He denies chest pain, shortness of breath, dyspnea. He has not noticed any bleeding. Does not report any cough or hemoptysis or wheezing. Does not report any nausea, vomiting or abdominal pain. The remaining review of systems unremarkable.  Medications: I have reviewed the patient's current medications.  Current Outpatient Prescriptions  Medication Sig Dispense Refill  . aspirin EC 325 MG tablet Take 325 mg by mouth daily.     . carvedilol (COREG) 25 MG tablet TAKE 1 TABLET BY MOUTH TWICE A DAY WITH A MEAL 180 tablet 0  . cholecalciferol (VITAMIN D) 1000 UNITS tablet Take 1,000 Units by mouth daily with supper.     . furosemide (LASIX) 40 MG tablet Take 2 tablets (80 mg total) by mouth daily. 180 tablet 1  . glimepiride (AMARYL) 1 MG tablet Take 1 tablet (1 mg total) by mouth daily with breakfast. 30 tablet 0  . hydrALAZINE (APRESOLINE) 25 MG tablet TAKE 1 TABLET BY MOUTH 3 TIMES A DAY 270  tablet 3  . isosorbide mononitrate (IMDUR) 60 MG 24 hr tablet TAKE 1 TABLET BY MOUTH EVERY DAY 30 tablet 6  . Lancets (FREESTYLE) lancets by Other route. Use one lancet in morning before breakfast to check blood sugar.  Dx E11.8    . levofloxacin (LEVAQUIN) 500 MG tablet Take 1 tablet (500 mg total) by mouth every other day. (Patient taking differently: Take 500 mg by mouth every other day. For 4 days) 4 tablet 0  . Multiple Vitamin (MULTIVITAMIN WITH MINERALS) TABS tablet Take 1 tablet by mouth daily.    . nitroGLYCERIN (NITROSTAT) 0.4 MG SL tablet Place 0.4 mg under the tongue every 5 (five) minutes as needed for chest pain. As needed    . omeprazole (PRILOSEC) 20 MG capsule Take 20 mg by mouth daily.     . tamsulosin (FLOMAX) 0.4 MG CAPS capsule TAKE 1 CAPSULE EVERY DAY AFTER DINNER 30 capsule 5  . Travoprost, BAK Free, (TRAVATAN) 0.004 % SOLN ophthalmic solution Place 1 drop into both eyes daily.     . [DISCONTINUED] metFORMIN (GLUCOPHAGE) 500 MG tablet Take 500-1,000 mg by mouth. 2 tabs in the am, 1 tab in the pm     No current facility-administered medications for this visit.    Allergies:  Allergies  Allergen Reactions  . Statins Other (See Comments)    Pain/weakness in legs    Past Medical History, Surgical history, Social history, and Family History were reviewed and updated.    Physical Exam: Blood pressure 161/56, pulse 57, temperature 98.2 F (36.8 C), temperature source Oral, resp.  rate 18, height 5\' 8"  (1.727 m), weight 147 lb 6.4 oz (66.86 kg), SpO2 98 %. ECOG: 1 General appearance:  Awake, alert gentleman without distress.. Head: Normocephalic, without obvious abnormality no oral ulcers or lesions. Neck: no adenopathy Lymph nodes: Cervical, supraclavicular, and axillary nodes normal. Heart:regular rate and rhythm, S1, S2 normal, no murmur, click, rub or gallop Lung:chest clear, expiratory wheezes noted bilaterally. No dullness to percussion. Abdomen: soft,  non-tender, without masses or organomegaly EXT:no erythema, induration, or nodules   Lab Results: Lab Results  Component Value Date   WBC 6.0 11/10/2015   HGB 10.9* 11/10/2015   HCT 33.3* 11/10/2015   MCV 100.0* 11/10/2015   PLT 127* 11/10/2015     Chemistry      Component Value Date/Time   NA 138 10/23/2015 0230   NA 140 02/15/2015 1539   NA 140 09/04/2012 1309   K 3.6 10/23/2015 0230   K 4.7 09/04/2012 1309   CL 101 10/23/2015 0230   CL 101 09/04/2012 1309   CO2 26 10/23/2015 0230   CO2 31* 09/04/2012 1309   BUN 48* 10/23/2015 0230   BUN 39* 02/15/2015 1539   BUN 41.0* 09/04/2012 1309   CREATININE 2.63* 10/23/2015 0230   CREATININE 2.21* 01/08/2013 1152   CREATININE 2.2* 09/04/2012 1309      Component Value Date/Time   CALCIUM 9.0 10/23/2015 0230   CALCIUM 9.3 09/04/2012 1309   ALKPHOS 54 10/22/2015 2200   ALKPHOS 73 09/04/2012 1309   AST 27 10/22/2015 2200   AST 18 09/04/2012 1309   ALT 16* 10/22/2015 2200   ALT 21 09/04/2012 1309   BILITOT 0.7 10/22/2015 2200   BILITOT 0.4 02/15/2015 1539   BILITOT 0.40 09/04/2012 1309       Impression and Plan:  This is an 81 year old gentleman with the following issues:  1. Anemia, multifactorial. Likely due to chronic disease and renal insufficiency. Hemoglobin is 10.9 and will not require any growth factor support or transfusions at this time. I have recommended continued observation surveillance given the stability of his hemoglobin for the time being. We can initiate growth factor support in the future if his hemoglobin drops below 10.  2. Thrombocytopenia: Continues to be relatively mild without any bleeding episodes. We'll continue to monitor.  3. Hypertension. Blood pressure is controlled with hydralazine, Coreg, and Lasix.  4. Coronary artery disease. He remains on aspirin, Coreg, and Imdur.  5. Followup. In 6 months and certainly sooner if needed to.     [name] Button, MD 11/30/20163:02 PM

## 2015-11-11 DIAGNOSIS — R2689 Other abnormalities of gait and mobility: Secondary | ICD-10-CM | POA: Diagnosis not present

## 2015-11-11 DIAGNOSIS — E1122 Type 2 diabetes mellitus with diabetic chronic kidney disease: Secondary | ICD-10-CM | POA: Diagnosis not present

## 2015-11-12 DIAGNOSIS — R2689 Other abnormalities of gait and mobility: Secondary | ICD-10-CM | POA: Diagnosis not present

## 2015-11-12 DIAGNOSIS — E1122 Type 2 diabetes mellitus with diabetic chronic kidney disease: Secondary | ICD-10-CM | POA: Diagnosis not present

## 2015-11-15 ENCOUNTER — Other Ambulatory Visit: Payer: Self-pay | Admitting: Internal Medicine

## 2015-11-15 DIAGNOSIS — E1122 Type 2 diabetes mellitus with diabetic chronic kidney disease: Secondary | ICD-10-CM | POA: Diagnosis not present

## 2015-11-15 DIAGNOSIS — R2689 Other abnormalities of gait and mobility: Secondary | ICD-10-CM | POA: Diagnosis not present

## 2015-11-18 DIAGNOSIS — R2689 Other abnormalities of gait and mobility: Secondary | ICD-10-CM | POA: Diagnosis not present

## 2015-11-18 DIAGNOSIS — E1122 Type 2 diabetes mellitus with diabetic chronic kidney disease: Secondary | ICD-10-CM | POA: Diagnosis not present

## 2015-11-19 ENCOUNTER — Ambulatory Visit (HOSPITAL_COMMUNITY)
Admission: RE | Admit: 2015-11-19 | Discharge: 2015-11-19 | Disposition: A | Discharge status: Home / Self Care | Payer: Medicare Other | Source: Ambulatory Visit | Attending: Cardiology | Admitting: Cardiology

## 2015-11-19 ENCOUNTER — Ambulatory Visit
Admission: RE | Admit: 2015-11-19 | Discharge: 2015-11-19 | Disposition: A | Discharge status: Home / Self Care | Payer: Medicare Other | Source: Ambulatory Visit | Attending: Internal Medicine | Admitting: Internal Medicine

## 2015-11-19 VITALS — BP 120/52 | HR 54 | Wt 146.4 lb

## 2015-11-19 DIAGNOSIS — Z85038 Personal history of other malignant neoplasm of large intestine: Secondary | ICD-10-CM | POA: Diagnosis not present

## 2015-11-19 DIAGNOSIS — Z951 Presence of aortocoronary bypass graft: Secondary | ICD-10-CM | POA: Insufficient documentation

## 2015-11-19 DIAGNOSIS — I5042 Chronic combined systolic (congestive) and diastolic (congestive) heart failure: Secondary | ICD-10-CM

## 2015-11-19 DIAGNOSIS — J189 Pneumonia, unspecified organism: Secondary | ICD-10-CM | POA: Diagnosis not present

## 2015-11-19 DIAGNOSIS — I130 Hypertensive heart and chronic kidney disease with heart failure and stage 1 through stage 4 chronic kidney disease, or unspecified chronic kidney disease: Secondary | ICD-10-CM | POA: Insufficient documentation

## 2015-11-19 DIAGNOSIS — K219 Gastro-esophageal reflux disease without esophagitis: Secondary | ICD-10-CM | POA: Insufficient documentation

## 2015-11-19 DIAGNOSIS — Z7984 Long term (current) use of oral hypoglycemic drugs: Secondary | ICD-10-CM | POA: Diagnosis not present

## 2015-11-19 DIAGNOSIS — N189 Chronic kidney disease, unspecified: Secondary | ICD-10-CM | POA: Insufficient documentation

## 2015-11-19 DIAGNOSIS — I252 Old myocardial infarction: Secondary | ICD-10-CM | POA: Insufficient documentation

## 2015-11-19 DIAGNOSIS — I2510 Atherosclerotic heart disease of native coronary artery without angina pectoris: Secondary | ICD-10-CM | POA: Insufficient documentation

## 2015-11-19 DIAGNOSIS — Z8249 Family history of ischemic heart disease and other diseases of the circulatory system: Secondary | ICD-10-CM | POA: Diagnosis not present

## 2015-11-19 DIAGNOSIS — Z841 Family history of disorders of kidney and ureter: Secondary | ICD-10-CM | POA: Insufficient documentation

## 2015-11-19 DIAGNOSIS — Z952 Presence of prosthetic heart valve: Secondary | ICD-10-CM

## 2015-11-19 DIAGNOSIS — Z79899 Other long term (current) drug therapy: Secondary | ICD-10-CM | POA: Diagnosis not present

## 2015-11-19 DIAGNOSIS — Z953 Presence of xenogenic heart valve: Secondary | ICD-10-CM | POA: Insufficient documentation

## 2015-11-19 DIAGNOSIS — D631 Anemia in chronic kidney disease: Secondary | ICD-10-CM | POA: Diagnosis not present

## 2015-11-19 DIAGNOSIS — R1310 Dysphagia, unspecified: Secondary | ICD-10-CM

## 2015-11-19 DIAGNOSIS — Z7982 Long term (current) use of aspirin: Secondary | ICD-10-CM | POA: Insufficient documentation

## 2015-11-19 DIAGNOSIS — Z954 Presence of other heart-valve replacement: Secondary | ICD-10-CM

## 2015-11-19 DIAGNOSIS — E785 Hyperlipidemia, unspecified: Secondary | ICD-10-CM | POA: Diagnosis not present

## 2015-11-19 DIAGNOSIS — R7303 Prediabetes: Secondary | ICD-10-CM | POA: Insufficient documentation

## 2015-11-19 DIAGNOSIS — I5022 Chronic systolic (congestive) heart failure: Secondary | ICD-10-CM | POA: Diagnosis not present

## 2015-11-19 DIAGNOSIS — I255 Ischemic cardiomyopathy: Secondary | ICD-10-CM | POA: Insufficient documentation

## 2015-11-19 LAB — BASIC METABOLIC PANEL
Anion gap: 10 (ref 5–15)
BUN: 35 mg/dL — AB (ref 6–20)
CALCIUM: 9.5 mg/dL (ref 8.9–10.3)
CO2: 26 mmol/L (ref 22–32)
Chloride: 102 mmol/L (ref 101–111)
Creatinine, Ser: 2.21 mg/dL — ABNORMAL HIGH (ref 0.61–1.24)
GFR calc Af Amer: 29 mL/min — ABNORMAL LOW (ref >60)
GFR, EST NON AFRICAN AMERICAN: 25 mL/min — AB (ref >60)
Glucose, Bld: 108 mg/dL — ABNORMAL HIGH (ref 65–99)
POTASSIUM: 4.7 mmol/L (ref 3.5–5.1)
SODIUM: 138 mmol/L (ref 135–145)

## 2015-11-19 LAB — BRAIN NATRIURETIC PEPTIDE: B NATRIURETIC PEPTIDE 5: 528.4 pg/mL — AB (ref 0.0–100.0)

## 2015-11-19 NOTE — Patient Instructions (Signed)
Routine lab work today. Will notify you of abnormal results, otherwise no news is good news!  Follow up 3 months.  Do the following things EVERYDAY: 1) Weigh yourself in the morning before breakfast. Write it down and keep it in a log. 2) Take your medicines as prescribed 3) Eat low salt foods-Limit salt (sodium) to 2000 mg per day.  4) Stay as active as you can everyday 5) Limit all fluids for the day to less than 2 liters  

## 2015-11-20 NOTE — Progress Notes (Signed)
Patient ID: [name] [name], male   DOB: Apr 25, 1926, 81 y.o.   MRN: [id number] PCP: Hollace Kinnier  HPI: [name] [name] is a 81 y.o. male with a PMHx of CAD, CABG, bioprosthetic AVR, anemia, CKD last creatinine 2.2, Chronic systolic heart failure EF 40-45%. He is not on an ACE or ARB due renal failure.   Admitted to [facility] 4/28 through 04/15/13  with volume overload. Diuresed with IV lasix. He was not discharged on diuretics. Discharge weight 139 pounds.   He had a hernia repair in 10/16 and developed volume overload after.  Lasix was increased but he had progressive dyspnea.  He was readmitted and found to have AKI with anemia and HCAP.  He was treated for PNA and transfused 2 units PRBCs.  He was discharged to short-term rehab and remains there today.   Echo in 10/16 showed EF 40-45%, inferior/inferolateral/inferoseptal akinesis, bioprosthetic AoV with mean gradient 12 mmHg, RV with moderately decreased systolic function, PASP 54 mmHg.   He was admitted in 11/16 with aspiration PNA after inguinal hernia surgery.   He is at home now. He is using a cane to get around.  No dyspnea walking on flat ground. No orthopnea/PND.  No chest pain. Weight is down 8 lbs since last office visit.    Labs: 05/28/13 Creatinine 2.37 Potassium 4.3 7/14 Creatinine 2.2 3/15 HCT 35.9 10/16 K 3.5, creatinine 2.82, HCT 29.6 11/16 K 4.2, creatinine 2.2, HCT 33.3, plts 127  Past Medical History  Diagnosis Date  . History of colon cancer     s/p colon resection  . Aortic stenosis     a. s/p AVR with 65mm Edwards pericardial valve 02/07/12 - post-op course complicated by pleural effusion requring thoracentesis/leg cellulitis 03/2012.  Marland Kitchen CAD (coronary artery disease)     a. s/p NSTEMI 12/2011:  LHC - Ostial left main 20%, ostial LAD 50%, mid 60-70%, ostial D1 40% and mid 40%, D2 70%, ostial circumflex occluded, ostial RCA 80-90%, LVEDP was 42. b.  s/p CABG x 3 at time of AVR (LiMA-LAD, SVG-2nd daigonal, SVG-PDA) 02/07/12 (post-op  course noted above).  . Ischemic cardiomyopathy   . Chronic systolic heart failure ([place])     a. TEE 01/2012: EF 25-30%, diffuse hypokinesis. b. Not on ACEI due to renal insufficiency.;  c. follow up  echo 08/06/12: EF 25%, mod diast dysfxn, AVR ok, mild [name], mod LAE, mild RAE, mild to mod RV systolic dysfunction  . HLD (hyperlipidemia)   . GERD (gastroesophageal reflux disease)   . Chronic kidney disease   . Pleural effusion     a. post-operatively after AVR/CABG s/p thoracentesis 03/2012 yielding 1L serosanguinous fluid.  . Diabetes mellitus     borderline  . Blood transfusion     NO REACTION TO TRANSFUSION  . Cellulitis     a. RLE cellulitis 2 months post-operatively after AVR/CABG - serratia marcessans, tx with I&D/antibiotics  . Mitral regurgitation     Moderate by TEE 01/2012  . Flash pulmonary edema ([place])     Post-cath 12/2011, went into acute pulm edema requiring IV lasix and intubation  . Baker's cyst 07/23/12    Incidental finding of LE venous dopplers  . Cataract     right eye, hx of  . HTN (hypertension)     primary, Dr. Hollace Kinnier  . Myocardial infarction ([place]) 12/2011  . CHF (congestive heart failure) ([place])   . Stroke ([place]) 10/01    RIght Leg weakness  . Cancer [facility]) '90's  Colon  . Anemia   . Anxiety     Current Outpatient Prescriptions  Medication Sig Dispense Refill  . aspirin EC 325 MG tablet Take 325 mg by mouth daily.     . carvedilol (COREG) 25 MG tablet TAKE 1 TABLET BY MOUTH TWICE A DAY WITH A MEAL 180 tablet 0  . cholecalciferol (VITAMIN D) 1000 UNITS tablet Take 1,000 Units by mouth daily with supper.     . furosemide (LASIX) 40 MG tablet Take 2 tablets (80 mg total) by mouth daily. 180 tablet 1  . glimepiride (AMARYL) 1 MG tablet TAKE 1 TABLET BY MOUTH DAILY WITH BREAKFAST. 30 tablet 2  . hydrALAZINE (APRESOLINE) 25 MG tablet TAKE 1 TABLET BY MOUTH 3 TIMES A DAY 270 tablet 3  . isosorbide mononitrate (IMDUR) 60 MG 24 hr tablet TAKE 1 TABLET BY MOUTH  EVERY DAY 30 tablet 6  . Lancets (FREESTYLE) lancets by Other route. Use one lancet in morning before breakfast to check blood sugar.  Dx E11.8    . Multiple Vitamin (MULTIVITAMIN WITH MINERALS) TABS tablet Take 1 tablet by mouth daily.    . nitroGLYCERIN (NITROSTAT) 0.4 MG SL tablet Place 0.4 mg under the tongue every 5 (five) minutes as needed for chest pain. As needed    . omeprazole (PRILOSEC) 20 MG capsule Take 20 mg by mouth daily.     . tamsulosin (FLOMAX) 0.4 MG CAPS capsule TAKE 1 CAPSULE EVERY DAY AFTER DINNER 30 capsule 5  . Travoprost, BAK Free, (TRAVATAN) 0.004 % SOLN ophthalmic solution Place 1 drop into both eyes daily.     . [DISCONTINUED] metFORMIN (GLUCOPHAGE) 500 MG tablet Take 500-1,000 mg by mouth. 2 tabs in the am, 1 tab in the pm     No current facility-administered medications for this encounter.     Allergies  Allergen Reactions  . Statins Other (See Comments)    Pain/weakness in legs    Social History   Social History  . Marital Status: Married    Spouse Name: N/A  . Number of Children: N/A  . Years of Education: N/A   Occupational History  . Not on file.   Social History Main Topics  . Smoking status: Never Smoker   . Smokeless tobacco: Never Used  . Alcohol Use: No  . Drug Use: No  . Sexual Activity: Not Currently   Other Topics Concern  . Not on file   Social History Narrative    Family History  Problem Relation Age of Onset  . Cancer Mother   . Heart attack Father   . Mental illness Brother   . Kidney failure Brother     PHYSICAL EXAM: Filed Vitals:   11/19/15 1118  BP: 120/52  Pulse: 54   General: NAD HEENT: normal Neck: supple. JVD 5-6. Carotids 2+ bilat; bilateral carotid bruits. No lymphadenopathy or thryomegaly appreciated. Cor: PMI nondisplaced. Regular rate & rhythm. No rubs, gallops.  1/6 SEM RUSB.  Lungs: clear Abdomen: soft, nontender, nondistended. No hepatosplenomegaly. No bruits or masses. Good bowel  sounds. Extremities: no cyanosis, clubbing, rash, edema Neuro: alert & oriented x 3, cranial nerves grossly intact. moves all 4 extremities w/o difficulty. Affect pleasant.  ASSESSMENT & PLAN: 1. CAD: S/p CABG.  No chest pain. Continue ASA, Coreg.  He is not on a statin due to myalgias.  2. Aortic valve replacement: Bioprosthetic.  Stable on last echo in 10/16.  3. Chronic systolic CHF: Ischemic cardiomyopathy.  EF 40-45% on last echo.  Volume status looks ok today. - Continue Lasix 80 mg daily.  - Continue Coreg, hydralazine, and Imdur at current doses.  - Will check BMET/BNP today.  - Not on ACEI because of CKD.  4. CKD: BMET today.  5. Anemia: Anemia of CKD and chronic disease.  Will get CBC today.   Followup 3 months.   Loralie Champagne 11/20/2015

## 2015-11-24 DIAGNOSIS — R2689 Other abnormalities of gait and mobility: Secondary | ICD-10-CM | POA: Diagnosis not present

## 2015-11-24 DIAGNOSIS — E1122 Type 2 diabetes mellitus with diabetic chronic kidney disease: Secondary | ICD-10-CM | POA: Diagnosis not present

## 2015-12-02 ENCOUNTER — Encounter: Payer: Self-pay | Admitting: Internal Medicine

## 2015-12-02 ENCOUNTER — Ambulatory Visit (INDEPENDENT_AMBULATORY_CARE_PROVIDER_SITE_OTHER): Payer: Medicare Other | Admitting: Internal Medicine

## 2015-12-02 VITALS — BP 138/70 | HR 58 | Temp 97.3°F | Ht 68.0 in | Wt 147.0 lb

## 2015-12-02 DIAGNOSIS — I25110 Atherosclerotic heart disease of native coronary artery with unstable angina pectoris: Secondary | ICD-10-CM

## 2015-12-02 DIAGNOSIS — N183 Chronic kidney disease, stage 3 unspecified: Secondary | ICD-10-CM

## 2015-12-02 DIAGNOSIS — I5042 Chronic combined systolic (congestive) and diastolic (congestive) heart failure: Secondary | ICD-10-CM | POA: Diagnosis not present

## 2015-12-02 DIAGNOSIS — E118 Type 2 diabetes mellitus with unspecified complications: Secondary | ICD-10-CM | POA: Diagnosis not present

## 2015-12-02 DIAGNOSIS — I10 Essential (primary) hypertension: Secondary | ICD-10-CM

## 2015-12-02 NOTE — Progress Notes (Signed)
Patient ID: [name] [name], male   DOB: [birth year], 81 y.o.   MRN: [id number]   Location: [place] Provider: Rexene Edison. Mariea Clonts, D.O., C.M.D.  Code Status: DNR, MOST Goals of Care: Advanced Directive information Does patient have an advance directive?: Yes, Type of Advance Directive: [place], Does patient want to make changes to advanced directive?: No - Patient declined  Chief Complaint  Patient presents with  . Medical Management of Chronic Issues    1 month follow-up on pneumonia, no concerns     HPI: Patient is a 81 y.o. male seen in the office today for medical mgt of chronic diseases.    CXR 12/19 showed resolution of his pneumonia. Breathing doing pretty well.  No chest pain.  Rarely the right leg hurts, but other times it's just fine and he can walk normally.  (had stroke affecting that leg and veins came out of it, had dvt)  DMII:  Checking glucose at home--below 100 in the am.  Postprandially around 150-160.    Weight has been stable by report.  Also per our flow sheet, it's been between 146-147 range for the past two months.    HR stable in 50s.    When walking long distances, he uses the cane and his wife is walking with him.  Goes up and down stairs for exercise.  Has a nice railing on them.  12 steps.    Review of Systems:  Review of Systems  Constitutional: Negative for fever, chills and malaise/fatigue.  HENT: Negative for congestion.   Respiratory: Negative for cough and shortness of breath.   Cardiovascular: Negative for chest pain and leg swelling.  Gastrointestinal: Negative for abdominal pain and constipation.       Appetite is good  Genitourinary: Negative for dysuria.  Musculoskeletal: Negative for falls.       Right leg pain and foot drop come and go  Skin: Negative for rash.  Neurological: Negative for dizziness, loss of consciousness and weakness.  Endo/Heme/Allergies: Bruises/bleeds easily.  Psychiatric/Behavioral:  Positive for memory loss. The patient does not have insomnia.     Past Medical History  Diagnosis Date  . History of colon cancer     s/p colon resection  . Aortic stenosis     a. s/p AVR with 17mm Edwards pericardial valve 02/07/12 - post-op course complicated by pleural effusion requring thoracentesis/leg cellulitis 03/2012.  Marland Kitchen CAD (coronary artery disease)     a. s/p NSTEMI 12/2011:  LHC - Ostial left main 20%, ostial LAD 50%, mid 60-70%, ostial D1 40% and mid 40%, D2 70%, ostial circumflex occluded, ostial RCA 80-90%, LVEDP was 42. b.  s/p CABG x 3 at time of AVR (LiMA-LAD, SVG-2nd daigonal, SVG-PDA) 02/07/12 (post-op course noted above).  . Ischemic cardiomyopathy   . Chronic systolic heart failure ([place])     a. TEE 01/2012: EF 25-30%, diffuse hypokinesis. b. Not on ACEI due to renal insufficiency.;  c. follow up  echo 08/06/12: EF 25%, mod diast dysfxn, AVR ok, mild MR, mod LAE, mild RAE, mild to mod RV systolic dysfunction  . HLD (hyperlipidemia)   . GERD (gastroesophageal reflux disease)   . Chronic kidney disease   . Pleural effusion     a. post-operatively after AVR/CABG s/p thoracentesis 03/2012 yielding 1L serosanguinous fluid.  . Diabetes mellitus     borderline  . Blood transfusion     NO REACTION TO TRANSFUSION  . Cellulitis     a. RLE cellulitis 2  months post-operatively after AVR/CABG - serratia marcessans, tx with I&D/antibiotics  . Mitral regurgitation     Moderate by TEE 01/2012  . Flash pulmonary edema ([place])     Post-cath 12/2011, went into acute pulm edema requiring IV lasix and intubation  . Baker's cyst 07/23/12    Incidental finding of LE venous dopplers  . Cataract     right eye, hx of  . HTN (hypertension)     primary, Dr. Hollace Kinnier  . Myocardial infarction ([place]) 12/2011  . CHF (congestive heart failure) ([place])   . Stroke ([place]) 10/01    RIght Leg weakness  . Cancer [facility]) '90's    Colon  . Anemia   . Anxiety     Past Surgical History  Procedure  Laterality Date  . Colon resection  1996  . Esophageal dilation    . Colonoscopy    . Cataract extraction      rt  . Cardiac catheterization      1.3.13  stopped breathing, put on ventilator for 5-6 days  . Tonsillectomy    . Aortic valve replacement  02/07/2012    Procedure: AORTIC VALVE REPLACEMENT (AVR);  Surgeon: Gaye Pollack, MD;  Location: [place];  Service: Open Heart Surgery;  Laterality: N/A;  . Coronary artery bypass graft  02/07/2012    Procedure: CORONARY ARTERY BYPASS GRAFTING (CABG);  Surgeon: Gaye Pollack, MD;  Location: [place];  Service: Open Heart Surgery;  Laterality: N/A;  CABG x three; using right leg greater saphenous vein harvested endoscopically  . Chest tube insertion  07/24/2012    Procedure: INSERTION PLEURAL DRAINAGE CATHETER;  Surgeon: Gaye Pollack, MD;  Location: [place];  Service: Thoracic;  Laterality: Left;  . Talc pleurodesis  08/30/2012    Procedure: TALC PLEURADESIS;  Surgeon: Gaye Pollack, MD;  Location: [place];  Service: Thoracic;  Laterality: Left;  INSERTION OF TALC VIA LEFT PLEURX  . Talc pleurodesis  09/12/2012    Procedure: TALC PLEURADESIS;  Surgeon: Gaye Pollack, MD;  Location: [place];  Service: Thoracic;  Laterality: Left;  . Removal of pleural drainage catheter  09/27/2012    Procedure: REMOVAL OF PLEURAL DRAINAGE CATHETER;  Surgeon: Gaye Pollack, MD;  Location: [place];  Service: Thoracic;  Laterality: Left;  Marland Kitchen Eye surgery  2009    cataract removed from right eye  . Esophagogastroduodenoscopy N/A 07/03/2014    Procedure: ESOPHAGOGASTRODUODENOSCOPY (EGD);  Surgeon: Arta Silence, MD;  Location: [facility] ENDOSCOPY;  Service: Endoscopy;  Laterality: N/A;  . Esophagoscopy with dilitation N/A 07/07/2014    Procedure: ESOPHAGOSCOPY WITH DILITATION/SAVARY DILATOR;  Surgeon: Rozetta Nunnery, MD;  Location: [place];  Service: ENT;  Laterality: N/A;  . Ercp N/A 07/08/2014    Procedure: ENDOSCOPIC RETROGRADE CHOLANGIOPANCREATOGRAPHY (ERCP);  Surgeon: Missy Sabins, MD;  Location: [facility] ENDOSCOPY;  Service: Endoscopy;  Laterality: N/A;  . Left heart catheterization with coronary angiogram N/A 12/13/2011    Procedure: LEFT HEART CATHETERIZATION WITH CORONARY ANGIOGRAM;  Surgeon: Peter M Martinique, MD;  Location[facility] C[facility] LAB;  Service: Cardiovascular;  Laterality: N/A;  . Inguinal hernia repair Right 09/13/2015    Procedure: OPEN RIGHT INGUINAL HERNIA;  Surgeon: Mickeal Skinner, MD;  Location: [place];  Service: General;  Laterality: Right;  . Insertion of mesh Right 09/13/2015    Procedure: INSERTION OF MESH;  Surgeon: Mickeal Skinner, MD;  Location: [place];  Service: General;  Laterality: Right;    Allergies  Allergen Reactions  .  Statins Other (See Comments)    Pain/weakness in legs      Medication List       This list is accurate as of: 12/02/15  1:28 PM.  Always use your most recent med list.               aspirin EC 325 MG tablet  Take 325 mg by mouth daily.     carvedilol 25 MG tablet  Commonly known as:  COREG  TAKE 1 TABLET BY MOUTH TWICE A DAY WITH A MEAL     cholecalciferol 1000 UNITS tablet  Commonly known as:  VITAMIN D  Take 1,000 Units by mouth daily with supper.     freestyle lancets  by Other route. Use one lancet in morning before breakfast to check blood sugar.  Dx E11.8     furosemide 40 MG tablet  Commonly known as:  LASIX  Take 2 tablets (80 mg total) by mouth daily.     glimepiride 1 MG tablet  Commonly known as:  AMARYL  TAKE 1 TABLET BY MOUTH DAILY WITH BREAKFAST.     hydrALAZINE 25 MG tablet  Commonly known as:  APRESOLINE  TAKE 1 TABLET BY MOUTH 3 TIMES A DAY     isosorbide mononitrate 60 MG 24 hr tablet  Commonly known as:  IMDUR  TAKE 1 TABLET BY MOUTH EVERY DAY     multivitamin with minerals Tabs tablet  Take 1 tablet by mouth daily.     nitroGLYCERIN 0.4 MG SL tablet  Commonly known as:  NITROSTAT  Place 0.4 mg under the tongue every 5 (five) minutes as needed for chest pain. As needed       omeprazole 20 MG capsule  Commonly known as:  PRILOSEC  Take 20 mg by mouth daily.     tamsulosin 0.4 MG Caps capsule  Commonly known as:  FLOMAX  TAKE 1 CAPSULE EVERY DAY AFTER DINNER     Travoprost (BAK Free) 0.004 % Soln ophthalmic solution  Commonly known as:  TRAVATAN  Place 1 drop into both eyes daily.        Health Maintenance  Topic Date Due  . OPHTHALMOLOGY EXAM  01/28/1936  . PNA vac Low Risk Adult (2 of 2 - PCV13) 07/30/2014  . FOOT EXAM  02/15/2016  . HEMOGLOBIN A1C  04/22/2016  . INFLUENZA VACCINE  07/11/2016  . URINE MICROALBUMIN  10/20/2016  . TETANUS/TDAP  08/25/2025  . ZOSTAVAX  Completed    Physical Exam: Filed Vitals:   12/02/15 1322  BP: 138/70  Pulse: 58  Temp: 97.3 F (36.3 C)  TempSrc: Oral  Height: 5\' 8"  (1.727 m)  Weight: 147 lb (66.679 kg)  SpO2: 98%   Body mass index is 22.36 kg/(m^2). Physical Exam  Constitutional: He appears well-developed and well-nourished.  Eyes:  glasses  Cardiovascular: Normal rate, regular rhythm, normal heart sounds and intact distal pulses.   Pulmonary/Chest: Effort normal and breath sounds normal. No respiratory distress.  Musculoskeletal: Normal range of motion.  Slight right foot drop, slow but steady gait now  Neurological: He is alert.  Skin: Skin is warm and dry.  Psychiatric: He has a normal mood and affect.    Labs reviewed: Basic Metabolic Panel:  Recent Labs  09/20/15 1550  10/22/15 2200 10/22/15 2207 10/23/15 0230 11/10/15 1442 11/19/15 1210  NA  --   < > 136 138 138 138 138  K  --   < > 3.9 3.9 3.6 4.2 4.7  CL  --   < > 100* 100* 101  --  102  CO2  --   < > 25  --  26 29 26   GLUCOSE  --   < > 183* 184* 102* 123 108*  BUN  --   < > 48* 49* 48* 40.2* 35*  CREATININE  --   < > 2.68* 2.80* 2.63* 2.2* 2.21*  CALCIUM  --   < > 8.9  --  9.0 9.3 9.5  MG  --   --  2.3  --   --   --   --   PHOS 2.7  --   --   --   --   --   --   < > = values in this interval not displayed. Liver  Function Tests:  Recent Labs  09/21/15 0401 10/22/15 2200 11/10/15 1442  AST 26 27 18   ALT 19 16* 9  ALKPHOS 65 54 50  BILITOT 1.6* 0.7 0.48  PROT 6.1* 6.6 7.0  ALBUMIN 2.4* 3.0* 3.3*   No results for input(s): LIPASE, AMYLASE in the last 8760 hours. No results for input(s): AMMONIA in the last 8760 hours. CBC:  Recent Labs  09/20/15 0546  09/23/15 0429 10/06/15 1630 10/22/15 2207 10/23/15 0230 11/10/15 1442  WBC 8.6  < > 8.0 6.0  --  5.3 6.0  NEUTROABS 6.8  --  6.3  --   --   --  3.3  HGB 7.4*  < > 9.6* 9.5* 9.5* 9.1* 10.9*  HCT 23.3*  < > 29.8* 29.4* 28.0* 28.2* 33.3*  MCV 101.3*  < > 99.3 99.3  --  98.9 100.0*  PLT 180  < > 195 248  --  143* 127*  < > = values in this interval not displayed. Lipid Panel: No results for input(s): CHOL, HDL, LDLCALC, TRIG, CHOLHDL, LDLDIRECT in the last 8760 hours. Lab Results  Component Value Date   HGBA1C 6.4* 10/24/2015    Procedures since last visit: CXR 2 view:  No airspace consolidation remains currently. However, there is scarring in the left base. No new opacity. No change in cardiac silhouette.  Assessment/Plan 1. Atherosclerosis of native coronary artery of native heart with unstable angina pectoris (HCC) -has been stable w/o chest pain or shortness of breath with carvedilol, lasix, ASA, hydralazine, nitrate--on these so ace not needed - Lipid panel; Future  2. Essential hypertension -bp at goal with current therapy--still monitors at home also - Lipid panel; Future  3. Controlled type 2 diabetes mellitus with complication, without long-term current use of insulin (HCC) -cont asa, amaryl and monitoring once a day -no hypoglycemia noted as appetite is good -consider change to tradjenta in future to avoid hypoglycemia risk - Lipid panel; Future  4. Chronic kidney disease, stage III (moderate): Cr ~2.2 baseline -renal function has been stable since hospital discharge  5. Chronic combined systolic and diastolic  heart failure, NYHA class 2 (HCC) -no evidence of exacerbation at present -cont current agents -on hydralazine and nitrates so ace not needed   Labs/tests ordered:   Orders Placed This Encounter  Procedures  . Lipid panel    Standing Status: Future     Number of Occurrences:      Standing Expiration Date: 06/01/2016    Order Specific Question:  Has the patient fasted?    Answer:  Yes    Next appt:  02/10/2016 with flp before   [place]. Cainan Trull, D.O. [facility] [address], [place] [address] Cell Phone (Mon-Fri 8am-5pm):  [phone] On Call:  [phone] & follow prompts after 5pm & weekends Office Phone:  [phone] Office Fax:  [phone]

## 2015-12-09 ENCOUNTER — Other Ambulatory Visit: Payer: Self-pay | Admitting: Physician Assistant

## 2015-12-09 ENCOUNTER — Other Ambulatory Visit: Payer: Self-pay | Admitting: Surgery

## 2015-12-09 ENCOUNTER — Other Ambulatory Visit: Payer: Self-pay | Admitting: Cardiology

## 2015-12-13 ENCOUNTER — Other Ambulatory Visit: Payer: Self-pay | Admitting: Internal Medicine

## 2015-12-14 ENCOUNTER — Other Ambulatory Visit: Payer: Self-pay | Admitting: *Deleted

## 2015-12-14 ENCOUNTER — Other Ambulatory Visit (HOSPITAL_COMMUNITY): Payer: Self-pay | Admitting: *Deleted

## 2015-12-14 MED ORDER — FREESTYLE LANCETS MISC: Status: DC

## 2015-12-14 MED ORDER — AMBULATORY NON FORMULARY MEDICATION: Status: DC

## 2015-12-14 NOTE — Telephone Encounter (Signed)
[facility] 

## 2015-12-15 ENCOUNTER — Other Ambulatory Visit (HOSPITAL_COMMUNITY): Payer: Self-pay | Admitting: *Deleted

## 2015-12-15 NOTE — Telephone Encounter (Signed)
This pt has been seeing Dr. Aundra Dubin, this is a heart medication.

## 2015-12-20 ENCOUNTER — Other Ambulatory Visit: Payer: Self-pay | Admitting: Internal Medicine

## 2015-12-23 ENCOUNTER — Encounter: Payer: Self-pay | Admitting: Internal Medicine

## 2016-01-22 ENCOUNTER — Other Ambulatory Visit (HOSPITAL_COMMUNITY): Payer: Self-pay | Admitting: Internal Medicine

## 2016-02-02 ENCOUNTER — Other Ambulatory Visit (HOSPITAL_BASED_OUTPATIENT_CLINIC_OR_DEPARTMENT_OTHER): Payer: Medicare Other

## 2016-02-02 ENCOUNTER — Ambulatory Visit: Payer: Medicare Other

## 2016-02-02 DIAGNOSIS — D649 Anemia, unspecified: Secondary | ICD-10-CM

## 2016-02-02 DIAGNOSIS — N289 Disorder of kidney and ureter, unspecified: Secondary | ICD-10-CM

## 2016-02-02 DIAGNOSIS — D631 Anemia in chronic kidney disease: Secondary | ICD-10-CM

## 2016-02-02 DIAGNOSIS — N189 Chronic kidney disease, unspecified: Principal | ICD-10-CM

## 2016-02-02 DIAGNOSIS — D696 Thrombocytopenia, unspecified: Secondary | ICD-10-CM

## 2016-02-02 DIAGNOSIS — N179 Acute kidney failure, unspecified: Secondary | ICD-10-CM

## 2016-02-02 LAB — CBC WITH DIFFERENTIAL/PLATELET
BASO%: 0 % (ref 0.0–2.0)
Basophils Absolute: 0 10*3/uL (ref 0.0–0.1)
EOS ABS: 0.1 10*3/uL (ref 0.0–0.5)
EOS%: 1.4 % (ref 0.0–7.0)
HCT: 35.5 % — ABNORMAL LOW (ref 38.4–49.9)
HGB: 11.9 g/dL — ABNORMAL LOW (ref 13.0–17.1)
LYMPH%: 45.7 % (ref 14.0–49.0)
MCH: 32.9 pg (ref 27.2–33.4)
MCHC: 33.5 g/dL (ref 32.0–36.0)
MCV: 98.1 fL — AB (ref 79.3–98.0)
MONO#: 0.5 10*3/uL (ref 0.1–0.9)
MONO%: 6.8 % (ref 0.0–14.0)
NEUT#: 3.3 10*3/uL (ref 1.5–6.5)
NEUT%: 46.1 % (ref 39.0–75.0)
PLATELETS: 125 10*3/uL — AB (ref 140–400)
RBC: 3.62 10*6/uL — ABNORMAL LOW (ref 4.20–5.82)
RDW: 13.4 % (ref 11.0–14.6)
WBC: 7.1 10*3/uL (ref 4.0–10.3)
lymph#: 3.2 10*3/uL (ref 0.9–3.3)

## 2016-02-02 MED ORDER — DARBEPOETIN ALFA 300 MCG/0.6ML IJ SOSY: 300.0000 ug | PREFILLED_SYRINGE | Freq: Once | INTRAMUSCULAR | Status: DC

## 2016-02-07 ENCOUNTER — Other Ambulatory Visit: Payer: Medicare Other

## 2016-02-07 DIAGNOSIS — I10 Essential (primary) hypertension: Secondary | ICD-10-CM

## 2016-02-07 DIAGNOSIS — I25110 Atherosclerotic heart disease of native coronary artery with unstable angina pectoris: Secondary | ICD-10-CM | POA: Diagnosis not present

## 2016-02-07 DIAGNOSIS — N183 Chronic kidney disease, stage 3 unspecified: Secondary | ICD-10-CM

## 2016-02-07 DIAGNOSIS — E118 Type 2 diabetes mellitus with unspecified complications: Secondary | ICD-10-CM | POA: Diagnosis not present

## 2016-02-08 ENCOUNTER — Other Ambulatory Visit: Payer: Medicare Other

## 2016-02-08 LAB — BASIC METABOLIC PANEL
BUN/Creatinine Ratio: 19 (ref 10–22)
BUN: 46 mg/dL — ABNORMAL HIGH (ref 8–27)
CO2: 27 mmol/L (ref 18–29)
Calcium: 9.7 mg/dL (ref 8.6–10.2)
Chloride: 99 mmol/L (ref 96–106)
Creatinine, Ser: 2.42 mg/dL — ABNORMAL HIGH (ref 0.76–1.27)
GFR calc Af Amer: 27 mL/min/{1.73_m2} — ABNORMAL LOW (ref >59)
GFR calc non Af Amer: 23 mL/min/{1.73_m2} — ABNORMAL LOW (ref >59)
Glucose: 88 mg/dL (ref 65–99)
Potassium: 4.8 mmol/L (ref 3.5–5.2)
Sodium: 143 mmol/L (ref 134–144)

## 2016-02-08 LAB — LIPID PANEL
Chol/HDL Ratio: 5.2 ratio units — ABNORMAL HIGH (ref 0.0–5.0)
Cholesterol, Total: 178 mg/dL (ref 100–199)
HDL: 34 mg/dL — ABNORMAL LOW (ref >39)
LDL Calculated: 116 mg/dL — ABNORMAL HIGH (ref 0–99)
Triglycerides: 141 mg/dL (ref 0–149)
VLDL Cholesterol Cal: 28 mg/dL (ref 5–40)

## 2016-02-08 LAB — CBC WITH DIFFERENTIAL/PLATELET
Basophils Absolute: 0 10*3/uL (ref 0.0–0.2)
Basos: 0 %
EOS (ABSOLUTE): 0.1 10*3/uL (ref 0.0–0.4)
Eos: 2 %
Hematocrit: 39.6 % (ref 37.5–51.0)
Hemoglobin: 13.3 g/dL (ref 12.6–17.7)
Immature Grans (Abs): 0 10*3/uL (ref 0.0–0.1)
Immature Granulocytes: 0 %
Lymphocytes Absolute: 2.6 10*3/uL (ref 0.7–3.1)
Lymphs: 38 %
MCH: 32.6 pg (ref 26.6–33.0)
MCHC: 33.6 g/dL (ref 31.5–35.7)
MCV: 97 fL (ref 79–97)
Monocytes Absolute: 0.5 10*3/uL (ref 0.1–0.9)
Monocytes: 7 %
Neutrophils Absolute: 3.7 10*3/uL (ref 1.4–7.0)
Neutrophils: 53 %
Platelets: 164 10*3/uL (ref 150–379)
RBC: 4.08 x10E6/uL — ABNORMAL LOW (ref 4.14–5.80)
RDW: 13.7 % (ref 12.3–15.4)
WBC: 7 10*3/uL (ref 3.4–10.8)

## 2016-02-08 LAB — HEMOGLOBIN A1C
Est. average glucose Bld gHb Est-mCnc: 123 mg/dL
Hgb A1c MFr Bld: 5.9 % — ABNORMAL HIGH (ref 4.8–5.6)

## 2016-02-10 ENCOUNTER — Encounter: Payer: Self-pay | Admitting: Internal Medicine

## 2016-02-10 ENCOUNTER — Ambulatory Visit (INDEPENDENT_AMBULATORY_CARE_PROVIDER_SITE_OTHER): Payer: Medicare Other | Admitting: Internal Medicine

## 2016-02-10 VITALS — BP 155/72 | HR 56 | Temp 97.7°F | Ht 66.5 in | Wt 151.0 lb

## 2016-02-10 DIAGNOSIS — N183 Chronic kidney disease, stage 3 unspecified: Secondary | ICD-10-CM

## 2016-02-10 DIAGNOSIS — I10 Essential (primary) hypertension: Secondary | ICD-10-CM

## 2016-02-10 DIAGNOSIS — E118 Type 2 diabetes mellitus with unspecified complications: Secondary | ICD-10-CM | POA: Diagnosis not present

## 2016-02-10 DIAGNOSIS — I5042 Chronic combined systolic (congestive) and diastolic (congestive) heart failure: Secondary | ICD-10-CM | POA: Diagnosis not present

## 2016-02-10 DIAGNOSIS — Z23 Encounter for immunization: Secondary | ICD-10-CM | POA: Diagnosis not present

## 2016-02-10 DIAGNOSIS — Z0000 Encounter for general adult medical examination without abnormal findings: Secondary | ICD-10-CM | POA: Diagnosis not present

## 2016-02-10 DIAGNOSIS — I25110 Atherosclerotic heart disease of native coronary artery with unstable angina pectoris: Secondary | ICD-10-CM | POA: Diagnosis not present

## 2016-02-10 NOTE — Progress Notes (Signed)
Patient ID: [name] [name], male   DOB: June 05, 1926, 82 y.o.   MRN: [id number]   Location:  [facility] clinic Provider: Willadene Mounsey L. Mariea Clonts, D.O., C.M.D.  Patient Care Team: Gayland Curry, DO as PCP - General (Geriatric Medicine) Renella Cunas, MD (Cardiology) Gayland Curry, DO as Resident (Geriatric Medicine) Mickeal Skinner, MD as Consulting Physician (General Surgery) Luberta Mutter, MD as Consulting Physician (Ophthalmology)  Extended Emergency Contact Information Primary Emergency Contact: Stines,Roberta Address: [address], [place] [address] [place] Phone: [phone] Relation: Spouse Secondary Emergency Contact: Jerrett,Bruce Address: [address]          [place], [place] [address] [place] Phone: [phone] Relation: Son  Code Status: DNR Goals of Care: Advanced Directive information Advanced Directives 02/10/2016  Does patient have an advance directive? Yes  Type of Advance Directive Living will;Out of facility DNR (pink MOST or yellow form)  Does patient want to make changes to advanced directive? -  Copy of advanced directive(s) in chart? Yes  Pre-existing out of facility DNR order (yellow form or pink MOST form) Pink MOST form placed in chart (order not valid for inpatient use)     Chief Complaint  Patient presents with  . Annual Exam    Wellness Exam  . Medical Management of Chronic Issues    medication management blood pressure, blood sugar, CHF, cholesterol  Here with wife  . MMSE    23/30 failed clock drawing    HPI: Patient is a 82 y.o. male seen in today for an annual wellness exam and medical mgt of chronic diseases.      Depression screen [facility] 2/9 02/10/2016 10/21/2015 10/15/2014 05/28/2014 03/06/2013  Decreased Interest 0 0 0 0 0  Down, Depressed, Hopeless 0 0 0 0 0  PHQ - 2 Score 0 0 0 0 0    Fall Risk  02/10/2016 12/02/2015 10/21/2015 08/23/2015 06/17/2015  Falls in the past year? No  No Yes No No  Number falls in past yr: - - 2 or more - -  Injury with Fall? - - Yes - -   MMSE - Mini Mental State Exam 02/10/2016 06/17/2015  Orientation to time 2 4  Orientation to Place 5 5  Registration 3 3  Attention/ Calculation 4 4  Recall 0 0  Language- name 2 objects 2 2  Language- repeat 1 1  Language- follow 3 step command 3 3  Language- read & follow direction 1 1  Write a sentence 1 1  Copy design 1 1  Total score 23 25  Failed clock drawing (did it right and then changed it)   Health Maintenance  Topic Date Due  . PNA vac Low Risk Adult (2 of 2 - PCV13) 07/30/2014  . FOOT EXAM  02/15/2016  . OPHTHALMOLOGY EXAM  03/25/2016  . INFLUENZA VACCINE  07/11/2016  . HEMOGLOBIN A1C  08/06/2016  . URINE MICROALBUMIN  10/20/2016  . TETANUS/TDAP  08/25/2025  . ZOSTAVAX  Completed   Urinary incontinence?  No problems Functional Status Survey: Is the patient deaf or have difficulty hearing?: Yes Does the patient have difficulty seeing, even when wearing glasses/contacts?: No Does the patient have difficulty concentrating, remembering, or making decisions?: Yes Does the patient have difficulty walking or climbing stairs?: Yes Does the patient have difficulty dressing or bathing?: No Does the patient have difficulty doing errands alone such as visiting a  doctor's office or shopping?: Yes Exercise?  Does his morning exercise routine or stretches and walking Diet?  Tries to follow a low carb low sodium diet Vision Screening Comments: Dr. Ellie Lunch 03/26/2015  Hearing:  HOH, but no hearing aides Dentition:  Due to see dentist, it's been over a year Pain:  No pain  Past Medical History  Diagnosis Date  . History of colon cancer     s/p colon resection  . Aortic stenosis     a. s/p AVR with 33mm Edwards pericardial valve 02/07/12 - post-op course complicated by pleural effusion requring thoracentesis/leg cellulitis 03/2012.  Marland Kitchen CAD (coronary artery disease)     a. s/p NSTEMI 12/2011:   LHC - Ostial left main 20%, ostial LAD 50%, mid 60-70%, ostial D1 40% and mid 40%, D2 70%, ostial circumflex occluded, ostial RCA 80-90%, LVEDP was 42. b.  s/p CABG x 3 at time of AVR (LiMA-LAD, SVG-2nd daigonal, SVG-PDA) 02/07/12 (post-op course noted above).  . Ischemic cardiomyopathy   . Chronic systolic heart failure ([place])     a. TEE 01/2012: EF 25-30%, diffuse hypokinesis. b. Not on ACEI due to renal insufficiency.;  c. follow up  echo 08/06/12: EF 25%, mod diast dysfxn, AVR ok, mild MR, mod LAE, mild RAE, mild to mod RV systolic dysfunction  . HLD (hyperlipidemia)   . GERD (gastroesophageal reflux disease)   . Chronic kidney disease   . Pleural effusion     a. post-operatively after AVR/CABG s/p thoracentesis 03/2012 yielding 1L serosanguinous fluid.  . Diabetes mellitus     borderline  . Blood transfusion     NO REACTION TO TRANSFUSION  . Cellulitis     a. RLE cellulitis 2 months post-operatively after AVR/CABG - serratia marcessans, tx with I&D/antibiotics  . Mitral regurgitation     Moderate by TEE 01/2012  . Flash pulmonary edema ([place])     Post-cath 12/2011, went into acute pulm edema requiring IV lasix and intubation  . Baker's cyst 07/23/12    Incidental finding of LE venous dopplers  . Cataract     right eye, hx of  . HTN (hypertension)     primary, Dr. Hollace Kinnier  . Myocardial infarction ([place]) 12/2011  . CHF (congestive heart failure) ([place])   . Stroke ([place]) 10/01    RIght Leg weakness  . Cancer [facility] '[facility]    Colon  . Anemia   . Anxiety     Past Surgical History  Procedure Laterality Date  . Colon resection  1996  . Esophageal dilation    . Colonoscopy    . Cataract extraction      rt  . Cardiac catheterization      1.3.13  stopped breathing, put on ventilator for 5-6 days  . Tonsillectomy    . Aortic valve replacement  02/07/2012    Procedure: AORTIC VALVE REPLACEMENT (AVR);  Surgeon: Gaye Pollack, MD;  Location: [place];  Service: Open Heart Surgery;   Laterality: N/A;  . Coronary artery bypass graft  02/07/2012    Procedure: CORONARY ARTERY BYPASS GRAFTING (CABG);  Surgeon: Gaye Pollack, MD;  Location: [place];  Service: Open Heart Surgery;  Laterality: N/A;  CABG x three; using right leg greater saphenous vein harvested endoscopically  . Chest tube insertion  07/24/2012    Procedure: INSERTION PLEURAL DRAINAGE CATHETER;  Surgeon: Gaye Pollack, MD;  Location: [place];  Service: Thoracic;  Laterality: Left;  . Talc pleurodesis  08/30/2012    Procedure: TALC  PLEURADESIS;  Surgeon: Gaye Pollack, MD;  Location: [place];  Service: Thoracic;  Laterality: Left;  INSERTION OF TALC VIA LEFT PLEURX  . Talc pleurodesis  09/12/2012    Procedure: TALC PLEURADESIS;  Surgeon: Gaye Pollack, MD;  Location: [place];  Service: Thoracic;  Laterality: Left;  . Removal of pleural drainage catheter  09/27/2012    Procedure: REMOVAL OF PLEURAL DRAINAGE CATHETER;  Surgeon: Gaye Pollack, MD;  Location: [place];  Service: Thoracic;  Laterality: Left;  Marland Kitchen Eye surgery  2009    cataract removed from right eye  . Esophagogastroduodenoscopy N/A 07/03/2014    Procedure: ESOPHAGOGASTRODUODENOSCOPY (EGD);  Surgeon: Arta Silence, MD;  Location: [facility] ENDOSCOPY;  Service: Endoscopy;  Laterality: N/A;  . Esophagoscopy with dilitation N/A 07/07/2014    Procedure: ESOPHAGOSCOPY WITH DILITATION/SAVARY DILATOR;  Surgeon: Rozetta Nunnery, MD;  Location: [place];  Service: ENT;  Laterality: N/A;  . Ercp N/A 07/08/2014    Procedure: ENDOSCOPIC RETROGRADE CHOLANGIOPANCREATOGRAPHY (ERCP);  Surgeon: Missy Sabins, MD;  Location[facility];  Service: Endoscopy;  Laterality: N/A;  . Left heart catheterization with coronary angiogram N/A 12/13/2011    Procedure: LEFT HEART CATHETERIZATION WITH CORONARY ANGIOGRAM;  Surgeon: Peter M Martinique, MD;  Location: [facility] CATH LAB;  Service: Cardiovascular;  Laterality: N/A;  . Inguinal hernia repair Right 09/13/2015    Procedure: OPEN RIGHT INGUINAL HERNIA;   Surgeon: Mickeal Skinner, MD;  Location: [place];  Service: General;  Laterality: Right;  . Insertion of mesh Right 09/13/2015    Procedure: INSERTION OF MESH;  Surgeon: Mickeal Skinner, MD;  Location: [place];  Service: General;  Laterality: Right;    Social History   Social History  . Marital Status: Married    Spouse Name: N/A  . Number of Children: N/A  . Years of Education: N/A   Occupational History  . retired owned [employer]    Social History Main Topics  . Smoking status: Never Smoker   . Smokeless tobacco: Never Used  . Alcohol Use: No  . Drug Use: No  . Sexual Activity: Not Currently   Other Topics Concern  . Not on file   Social History Narrative   Married Angelita Ingles   Never smoked   Alcohol none   Exercise walking, weights   Living Will, MOST form       Allergies  Allergen Reactions  . Statins Other (See Comments)    Pain/weakness in legs      Medication List       This list is accurate as of: 02/10/16  2:32 PM.  Always use your most recent med list.               AMBULATORY NON FORMULARY MEDICATION  Freestyle lite test strips Use to test blood sugar once daily. Dx E11.8     aspirin EC 325 MG tablet  Take 325 mg by mouth daily.     carvedilol 25 MG tablet  Commonly known as:  COREG  TAKE 1 TABLET BY MOUTH TWICE DAILY WITH MEALS     cholecalciferol 1000 units tablet  Commonly known as:  VITAMIN D  Take 1,000 Units by mouth daily with supper.     freestyle lancets  Use one lancet in morning before breakfast to check blood sugar.  Dx E11.8     furosemide 40 MG tablet  Commonly known as:  LASIX  TAKE 2 TABLETS BY MOUTH DAILY     glimepiride 1 MG  tablet  Commonly known as:  AMARYL  TAKE 1 TABLET BY MOUTH DAILY WITH BREAKFAST.     hydrALAZINE 25 MG tablet  Commonly known as:  APRESOLINE  TAKE 1 TABLET BY MOUTH 3 TIMES A DAY     isosorbide mononitrate 60 MG 24 hr tablet  Commonly known as:  IMDUR  TAKE 1 TABLET BY MOUTH  EVERY DAY     multivitamin with minerals Tabs tablet  Take 1 tablet by mouth daily.     nitroGLYCERIN 0.4 MG SL tablet  Commonly known as:  NITROSTAT  Place 0.4 mg under the tongue every 5 (five) minutes as needed for chest pain. As needed     omeprazole 20 MG capsule  Commonly known as:  PRILOSEC  Take 20 mg by mouth daily.     tamsulosin 0.4 MG Caps capsule  Commonly known as:  FLOMAX  TAKE 1 CAPSULE EVERY DAY AFTER DINNER     Travoprost (BAK Free) 0.004 % Soln ophthalmic solution  Commonly known as:  TRAVATAN  Place 1 drop into both eyes daily.         Review of Systems:  Review of Systems  Constitutional: Negative for fever, chills and malaise/fatigue.       Shivering due to being cold in here  HENT: Positive for hearing loss.   Eyes: Negative for blurred vision.       Glasses  Respiratory: Negative for shortness of breath.   Cardiovascular: Negative for chest pain, palpitations and leg swelling.  Gastrointestinal: Negative for abdominal pain, constipation, blood in stool and melena.  Genitourinary: Negative for dysuria, urgency, frequency and hematuria.  Musculoskeletal: Negative for falls.  Skin: Negative for rash.  Neurological: Negative for dizziness and weakness.  Psychiatric/Behavioral: Positive for memory loss. Negative for depression.    Physical Exam: Filed Vitals:   02/10/16 1401  BP: 155/72  Pulse: 56  Temp: 97.7 F (36.5 C)  TempSrc: Oral  Height: 5' 6.5" (1.689 m)  Weight: 151 lb (68.493 kg)  SpO2: 97%   Body mass index is 24.01 kg/(m^2). Physical Exam  Constitutional: He is oriented to person, place, and time. He appears well-developed and well-nourished. No distress.  HENT:  Head: Normocephalic and atraumatic.  Right Ear: External ear normal.  Left Ear: External ear normal.  Nose: Nose normal.  Mouth/Throat: Oropharynx is clear and moist. No oropharyngeal exudate.  Eyes: Conjunctivae and EOM are normal. Pupils are equal, round, and  reactive to light.  Neck: Normal range of motion. Neck supple. No JVD present. No thyromegaly present.  Cardiovascular: Normal rate, regular rhythm, normal heart sounds and intact distal pulses.   Pulmonary/Chest: Effort normal and breath sounds normal. No respiratory distress.  Abdominal: Soft. Bowel sounds are normal. He exhibits no distension and no mass. There is no tenderness.  Musculoskeletal: Normal range of motion. He exhibits no edema or tenderness.  Lymphadenopathy:    He has no cervical adenopathy.  Neurological: He is alert and oriented to person, place, and time. He exhibits abnormal muscle tone.  DTRs 3+  Skin: Skin is warm and dry.  Right ear laceration from shaving  Psychiatric: He has a normal mood and affect.    Labs reviewed: Basic Metabolic Panel:  Recent Labs  09/20/15 1550  10/22/15 2200  10/23/15 0230 11/10/15 1442 11/19/15 1210 02/07/16 0902  NA  --   < > 136  < > 138 138 138 143  K  --   < > 3.9  < > 3.6 4.2  4.7 4.8  CL  --   < > 100*  < > 101  --  102 99  CO2  --   < > 25  --  26 29 26 27   GLUCOSE  --   < > 183*  < > 102* 123 108* 88  BUN  --   < > 48*  < > 48* 40.2* 35* 46*  CREATININE  --   < > 2.68*  < > 2.63* 2.2* 2.21* 2.42*  CALCIUM  --   < > 8.9  --  9.0 9.3 9.5 9.7  MG  --   --  2.3  --   --   --   --   --   PHOS 2.7  --   --   --   --   --   --   --   < > = values in this interval not displayed. Liver Function Tests:  Recent Labs  09/21/15 0401 10/22/15 2200 11/10/15 1442  AST 26 27 18   ALT 19 16* 9  ALKPHOS 65 54 50  BILITOT 1.6* 0.7 0.48  PROT 6.1* 6.6 7.0  ALBUMIN 2.4* 3.0* 3.3*   No results for input(s): LIPASE, AMYLASE in the last 8760 hours. No results for input(s): AMMONIA in the last 8760 hours. CBC:  Recent Labs  10/23/15 0230 11/10/15 1442 02/02/16 1353 02/07/16 0902  WBC 5.3 6.0 7.1 7.0  NEUTROABS  --  3.3 3.3 3.7  HGB 9.1* 10.9* 11.9*  --   HCT 28.2* 33.3* 35.5* 39.6  MCV 98.9 100.0* 98.1* 97  PLT 143*  127* 125* 164   Lipid Panel:  Recent Labs  02/07/16 0902  CHOL 178  HDL 34*  LDLCALC 116*  TRIG 141  CHOLHDL 5.2*   Lab Results  Component Value Date   HGBA1C 5.9* 02/07/2016  EKG 10-24-15 Dr. Aundra Dubin reviewed  Assessment/Plan 1. Controlled type 2 diabetes mellitus with complication, without long-term current use of insulin (HCC) - well controlled, cbgs reveal no hypoglycemia with his sulfonylurea--has tolerated it and it's working for him so will not stop -also cont asa, cbg checks  - Microalbumin/Creatinine Ratio, Urine  2. Medicare annual wellness visit, subsequent -up to date on all except needs prevnar--given today  3. Atherosclerosis of native coronary artery of native heart with unstable angina pectoris ([place]) -no chest pain, doing well, cont coreg, lasix, hydralazine, isosorbide in place of ace/arb due to renal function, has statin allergy  4. Chronic kidney disease, stage III (moderate): Cr ~2.2 baseline -has been getting gradually worse over time -avoid nsaids and renally adjust meds as needed  5. Essential hypertension -bp at goal with current regimen so cont same  6. Chronic combined systolic and diastolic heart failure, NYHA class 2 (HCC) -no signs of volume overload, weight stable (slight gain, but appears to be due to improved intake rather than fluid) -cont to monitor  Labs/tests ordered:   Orders Placed This Encounter  Procedures  . Microalbumin/Creatinine Ratio, Urine  . Hemoglobin A1c    Standing Status: Future     Number of Occurrences:      Standing Expiration Date: 08/12/2016  . Basic metabolic panel    Standing Status: Future     Number of Occurrences:      Standing Expiration Date: 08/12/2016   Next appt:  3 mos med mgt with labs before   Latia Mataya L. Kailer Heindel, D.O. [facility] [address] [place] [address] Cell  Phone (Mon-Fri 8am-5pm):  [phone] On Call:  [phone] & follow prompts  after 5pm & weekends Office Phone:  [phone] Office Fax:  [phone]

## 2016-02-10 NOTE — Progress Notes (Signed)
Patient ID: [name] [name], male   DOB: [birth year], 82 y.o.   MRN: [id number] MMSE 23/30 failed clock drawing

## 2016-02-11 LAB — MICROALBUMIN / CREATININE URINE RATIO
Creatinine, Urine: 18 mg/dL
MICROALB/CREAT RATIO: 37.2 mg/g creat — ABNORMAL HIGH (ref 0.0–30.0)
Microalbumin, Urine: 6.7 ug/mL

## 2016-02-15 ENCOUNTER — Other Ambulatory Visit: Payer: Self-pay | Admitting: Internal Medicine

## 2016-02-24 ENCOUNTER — Other Ambulatory Visit: Payer: Self-pay | Admitting: *Deleted

## 2016-02-28 ENCOUNTER — Other Ambulatory Visit: Payer: Medicare Other

## 2016-03-02 ENCOUNTER — Ambulatory Visit: Payer: Medicare Other | Admitting: Internal Medicine

## 2016-03-28 DIAGNOSIS — H2512 Age-related nuclear cataract, left eye: Secondary | ICD-10-CM | POA: Diagnosis not present

## 2016-03-28 DIAGNOSIS — H52203 Unspecified astigmatism, bilateral: Secondary | ICD-10-CM | POA: Diagnosis not present

## 2016-03-28 DIAGNOSIS — H401131 Primary open-angle glaucoma, bilateral, mild stage: Secondary | ICD-10-CM | POA: Diagnosis not present

## 2016-04-04 ENCOUNTER — Other Ambulatory Visit: Payer: Self-pay | Admitting: Internal Medicine

## 2016-04-29 ENCOUNTER — Other Ambulatory Visit: Payer: Self-pay | Admitting: Internal Medicine

## 2016-05-02 ENCOUNTER — Other Ambulatory Visit: Payer: Self-pay | Admitting: Internal Medicine

## 2016-05-10 ENCOUNTER — Other Ambulatory Visit (HOSPITAL_BASED_OUTPATIENT_CLINIC_OR_DEPARTMENT_OTHER): Payer: Medicare Other

## 2016-05-10 ENCOUNTER — Other Ambulatory Visit: Payer: Medicare Other

## 2016-05-10 ENCOUNTER — Telehealth: Payer: Self-pay | Admitting: Oncology

## 2016-05-10 ENCOUNTER — Ambulatory Visit (HOSPITAL_BASED_OUTPATIENT_CLINIC_OR_DEPARTMENT_OTHER): Payer: Medicare Other | Admitting: Oncology

## 2016-05-10 VITALS — BP 161/89 | HR 58 | Temp 97.5°F | Resp 18 | Ht 66.5 in | Wt 152.4 lb

## 2016-05-10 DIAGNOSIS — I5042 Chronic combined systolic (congestive) and diastolic (congestive) heart failure: Secondary | ICD-10-CM

## 2016-05-10 DIAGNOSIS — I25110 Atherosclerotic heart disease of native coronary artery with unstable angina pectoris: Secondary | ICD-10-CM

## 2016-05-10 DIAGNOSIS — D696 Thrombocytopenia, unspecified: Secondary | ICD-10-CM

## 2016-05-10 DIAGNOSIS — E118 Type 2 diabetes mellitus with unspecified complications: Secondary | ICD-10-CM | POA: Diagnosis not present

## 2016-05-10 DIAGNOSIS — N189 Chronic kidney disease, unspecified: Secondary | ICD-10-CM | POA: Diagnosis not present

## 2016-05-10 DIAGNOSIS — D631 Anemia in chronic kidney disease: Secondary | ICD-10-CM

## 2016-05-10 DIAGNOSIS — I10 Essential (primary) hypertension: Secondary | ICD-10-CM

## 2016-05-10 DIAGNOSIS — N183 Chronic kidney disease, stage 3 unspecified: Secondary | ICD-10-CM

## 2016-05-10 LAB — COMPREHENSIVE METABOLIC PANEL
ALBUMIN: 3.9 g/dL (ref 3.5–5.0)
ALK PHOS: 55 U/L (ref 40–150)
ALT: 17 U/L (ref 0–55)
AST: 15 U/L (ref 5–34)
Anion Gap: 10 mEq/L (ref 3–11)
BUN: 52.8 mg/dL — ABNORMAL HIGH (ref 7.0–26.0)
CALCIUM: 9.8 mg/dL (ref 8.4–10.4)
CO2: 31 mEq/L — ABNORMAL HIGH (ref 22–29)
Chloride: 104 mEq/L (ref 98–109)
Creatinine: 2.7 mg/dL — ABNORMAL HIGH (ref 0.7–1.3)
EGFR: 20 mL/min/{1.73_m2} — AB (ref >90)
Glucose: 222 mg/dl — ABNORMAL HIGH (ref 70–140)
POTASSIUM: 4.8 meq/L (ref 3.5–5.1)
SODIUM: 144 meq/L (ref 136–145)
Total Bilirubin: 0.82 mg/dL (ref 0.20–1.20)
Total Protein: 7.7 g/dL (ref 6.4–8.3)

## 2016-05-10 LAB — CBC WITH DIFFERENTIAL/PLATELET
BASO%: 0.4 % (ref 0.0–2.0)
Basophils Absolute: 0 10*3/uL (ref 0.0–0.1)
EOS%: 1.6 % (ref 0.0–7.0)
Eosinophils Absolute: 0.1 10*3/uL (ref 0.0–0.5)
HEMATOCRIT: 38.9 % (ref 38.4–49.9)
HGB: 12.8 g/dL — ABNORMAL LOW (ref 13.0–17.1)
LYMPH#: 2.2 10*3/uL (ref 0.9–3.3)
LYMPH%: 34.2 % (ref 14.0–49.0)
MCH: 32.8 pg (ref 27.2–33.4)
MCHC: 33 g/dL (ref 32.0–36.0)
MCV: 99.6 fL — AB (ref 79.3–98.0)
MONO#: 0.4 10*3/uL (ref 0.1–0.9)
MONO%: 5.9 % (ref 0.0–14.0)
NEUT%: 57.9 % (ref 39.0–75.0)
NEUTROS ABS: 3.8 10*3/uL (ref 1.5–6.5)
PLATELETS: 124 10*3/uL — AB (ref 140–400)
RBC: 3.91 10*6/uL — ABNORMAL LOW (ref 4.20–5.82)
RDW: 13.1 % (ref 11.0–14.6)
WBC: 6.5 10*3/uL (ref 4.0–10.3)

## 2016-05-10 NOTE — Telephone Encounter (Signed)
per pof to sch pt appt-gave pt copy of avs °

## 2016-05-10 NOTE — Progress Notes (Signed)
Hematology and Oncology Follow Up Visit  Kortney Cantera [id number] 01-24-1926 82 y.o. 05/10/2016 11:58 AM [name], [name], DOReed, [name] L, DO   Principle Diagnosis: 82 year old gentleman with anemia that is multifactorial likely due to chronic disease and renal insufficiency  Current therapy: Observation and surveillance. He received Aranesp in the past but have not needed it recently.  Interim History:  Mr. Niziolek returns for routine followup with his wife. Since the last visit, he reports no major changes in his health. Since November 2016, he has not had any other hospitalizations. His performance status and activity level returned to baseline. He is able to ambulate, drive and attends to activities of daily living. He reports no respiratory symptoms or constitutional symptoms. He denied any hematochezia or melena.   Does not report any headaches, blurry vision, syncope or seizures. Has not reported any bleeding complications. He denies chest pain, shortness of breath, dyspnea. He has not noticed any bleeding. Does not report any cough or hemoptysis or wheezing. Does not report any nausea, vomiting or abdominal pain. The remaining review of systems unremarkable.  Medications: I have reviewed the patient's current medications.  Current Outpatient Prescriptions  Medication Sig Dispense Refill  . AMBULATORY NON FORMULARY MEDICATION Freestyle lite test strips Use to test blood sugar once daily. Dx E11.8 100 each 11  . aspirin EC 325 MG tablet Take 325 mg by mouth daily.     . carvedilol (COREG) 25 MG tablet TAKE 1 TABLET BY MOUTH TWICE DAILY WITH MEALS 180 tablet 3  . cholecalciferol (VITAMIN D) 1000 UNITS tablet Take 1,000 Units by mouth daily with supper.     . furosemide (LASIX) 40 MG tablet TAKE 2 TABLETS BY MOUTH DAILY 180 tablet 2  . glimepiride (AMARYL) 1 MG tablet TAKE 1 TABLET BY MOUTH DAILY WITH BREAKFAST 30 tablet 3  . hydrALAZINE (APRESOLINE) 25 MG tablet TAKE 1 TABLET BY  MOUTH 3 TIMES A DAY 270 tablet 3  . isosorbide mononitrate (IMDUR) 60 MG 24 hr tablet TAKE 1 TABLET BY MOUTH EVERY DAY 30 tablet 0  . Lancets (FREESTYLE) lancets Use one lancet in morning before breakfast to check blood sugar.  Dx E11.8 100 each 11  . Multiple Vitamin (MULTIVITAMIN WITH MINERALS) TABS tablet Take 1 tablet by mouth daily.    . nitroGLYCERIN (NITROSTAT) 0.4 MG SL tablet Place 0.4 mg under the tongue every 5 (five) minutes as needed for chest pain. As needed    . omeprazole (PRILOSEC) 20 MG capsule Take 20 mg by mouth daily.     . tamsulosin (FLOMAX) 0.4 MG CAPS capsule TAKE 1 CAPSULE BY MOUTH EVERY DAY AFTER DINNER 30 capsule 0  . Travoprost, BAK Free, (TRAVATAN) 0.004 % SOLN ophthalmic solution Place 1 drop into both eyes daily.     . [DISCONTINUED] metFORMIN (GLUCOPHAGE) 500 MG tablet Take 500-1,000 mg by mouth. 2 tabs in the am, 1 tab in the pm     No current facility-administered medications for this visit.    Allergies:  Allergies  Allergen Reactions  . Statins Other (See Comments)    Pain/weakness in legs    Past Medical History, Surgical history, Social history, and Family History were reviewed and updated.    Physical Exam: Blood pressure 161/89, pulse 58, temperature 97.5 F (36.4 C), temperature source Oral, resp. rate 18, height 5' 6.5" (1.689 m), weight 152 lb 6.4 oz (69.128 kg), SpO2 100 %. ECOG: 1 General appearance: Elderly pleasant gentleman without distress. Head: Normocephalic, without obvious abnormality no  oral ulcers or lesions. No oral thrush noted. Neck: no adenopathy Lymph nodes: Cervical, supraclavicular, and axillary nodes normal. Heart:regular rate and rhythm, S1, S2 normal, no murmur, click, rub or gallop Lung:chest clear, expiratory wheezes noted bilaterally.  Abdomen: soft, non-tender, without masses or organomegaly no shifting dullness or ascites. EXT:no erythema, induration, or nodules   Lab Results: Lab Results  Component Value  Date   WBC 6.5 05/10/2016   HGB 12.8* 05/10/2016   HCT 38.9 05/10/2016   MCV 99.6* 05/10/2016   PLT 124* 05/10/2016     Chemistry      Component Value Date/Time   NA 143 02/07/2016 0902   NA 138 11/19/2015 1210   NA 138 11/10/2015 1442   K 4.8 02/07/2016 0902   K 4.2 11/10/2015 1442   CL 99 02/07/2016 0902   CL 101 09/04/2012 1309   CO2 27 02/07/2016 0902   CO2 29 11/10/2015 1442   BUN 46* 02/07/2016 0902   BUN 35* 11/19/2015 1210   BUN 40.2* 11/10/2015 1442   CREATININE 2.42* 02/07/2016 0902   CREATININE 2.2* 11/10/2015 1442   CREATININE 2.21* 01/08/2013 1152      Component Value Date/Time   CALCIUM 9.7 02/07/2016 0902   CALCIUM 9.3 11/10/2015 1442   ALKPHOS 50 11/10/2015 1442   ALKPHOS 54 10/22/2015 2200   AST 18 11/10/2015 1442   AST 27 10/22/2015 2200   ALT 9 11/10/2015 1442   ALT 16* 10/22/2015 2200   BILITOT 0.48 11/10/2015 1442   BILITOT 0.7 10/22/2015 2200   BILITOT 0.4 02/15/2015 1539       Impression and Plan:  This is an 82 year old gentleman with the following issues:  1. Anemia, multifactorial. due to chronic disease and renal insufficiency. Hemoglobin is 12.8 and will not require any growth factor support or transfusions at this time. I have recommended continued observation surveillance given the stability of his hemoglobin for the time being. We can initiate growth factor support in the future if his hemoglobin drops below 10. His hemoglobin had been within normal range for the last year or so.  2. Thrombocytopenia: Continues to be relatively mild without any bleeding episodes. Likely reactive in nature and does not require intervention.  3. Hypertension. Blood pressure is controlled with hydralazine, Coreg, and Lasix.  4. Followup. In 10 months and sooner if needed to.     [facility], MD 5/31/201711:58 AM

## 2016-05-11 LAB — BASIC METABOLIC PANEL
BUN/Creatinine Ratio: 20 (ref 10–24)
BUN: 51 mg/dL — ABNORMAL HIGH (ref 8–27)
CO2: 27 mmol/L (ref 18–29)
Calcium: 9.9 mg/dL (ref 8.6–10.2)
Chloride: 102 mmol/L (ref 96–106)
Creatinine, Ser: 2.49 mg/dL — ABNORMAL HIGH (ref 0.76–1.27)
GFR calc Af Amer: 26 mL/min/{1.73_m2} — ABNORMAL LOW (ref >59)
GFR calc non Af Amer: 22 mL/min/{1.73_m2} — ABNORMAL LOW (ref >59)
Glucose: 113 mg/dL — ABNORMAL HIGH (ref 65–99)
Potassium: 4.9 mmol/L (ref 3.5–5.2)
Sodium: 146 mmol/L — ABNORMAL HIGH (ref 134–144)

## 2016-05-11 LAB — HEMOGLOBIN A1C
Est. average glucose Bld gHb Est-mCnc: 123 mg/dL
Hgb A1c MFr Bld: 5.9 % — ABNORMAL HIGH (ref 4.8–5.6)

## 2016-05-12 ENCOUNTER — Encounter: Payer: Self-pay | Admitting: *Deleted

## 2016-05-15 ENCOUNTER — Encounter: Payer: Self-pay | Admitting: Internal Medicine

## 2016-05-15 ENCOUNTER — Ambulatory Visit (INDEPENDENT_AMBULATORY_CARE_PROVIDER_SITE_OTHER): Payer: Medicare Other | Admitting: Internal Medicine

## 2016-05-15 VITALS — BP 130/60 | HR 58 | Temp 98.2°F | Wt 154.0 lb

## 2016-05-15 DIAGNOSIS — N189 Chronic kidney disease, unspecified: Secondary | ICD-10-CM

## 2016-05-15 DIAGNOSIS — I25110 Atherosclerotic heart disease of native coronary artery with unstable angina pectoris: Secondary | ICD-10-CM | POA: Diagnosis not present

## 2016-05-15 DIAGNOSIS — E118 Type 2 diabetes mellitus with unspecified complications: Secondary | ICD-10-CM | POA: Diagnosis not present

## 2016-05-15 DIAGNOSIS — D631 Anemia in chronic kidney disease: Secondary | ICD-10-CM | POA: Diagnosis not present

## 2016-05-15 DIAGNOSIS — N183 Chronic kidney disease, stage 3 unspecified: Secondary | ICD-10-CM

## 2016-05-15 DIAGNOSIS — I10 Essential (primary) hypertension: Secondary | ICD-10-CM

## 2016-05-15 DIAGNOSIS — I5042 Chronic combined systolic (congestive) and diastolic (congestive) heart failure: Secondary | ICD-10-CM

## 2016-05-15 NOTE — Progress Notes (Signed)
Location:  [facility] clinic Provider:  Gerrod Maule L. Mariea Clonts, D.O., C.M.D.  Code Status: DNR Goals of Care:  Advanced Directives 05/15/2016  Does patient have an advance directive? Yes  Type of Advance Directive Living will;Out of facility DNR (pink MOST or yellow form)  Copy of advanced directive(s) in chart? Yes  Pre-existing out of facility DNR order (yellow form or pink MOST form) Pink MOST form placed in chart (order not valid for inpatient use)     Chief Complaint  Patient presents with  . Medical Management of Chronic Issues    24mth follow-up    HPI: Patient is a 82 y.o. male seen today for medical management of chronic diseases.    He has no concerns.  His wife has none either.  No changes in memory.  Weight stable.  Sugars great--list reviewed fasting from 72-147 and hba1c 5.9.    BP slightly up.  He reminds me it's due to being here, and he has had that trouble historically.    No pain.  No falls.    Past Medical History  Diagnosis Date  . History of colon cancer     s/p colon resection  . Aortic stenosis     a. s/p AVR with 56mm Edwards pericardial valve 02/07/12 - post-op course complicated by pleural effusion requring thoracentesis/leg cellulitis 03/2012.  Marland Kitchen CAD (coronary artery disease)     a. s/p NSTEMI 12/2011:  LHC - Ostial left main 20%, ostial LAD 50%, mid 60-70%, ostial D1 40% and mid 40%, D2 70%, ostial circumflex occluded, ostial RCA 80-90%, LVEDP was 42. b.  s/p CABG x 3 at time of AVR (LiMA-LAD, SVG-2nd daigonal, SVG-PDA) 02/07/12 (post-op course noted above).  . Ischemic cardiomyopathy   . Chronic systolic heart failure ([place])     a. TEE 01/2012: EF 25-30%, diffuse hypokinesis. b. Not on ACEI due to renal insufficiency.;  c. follow up  echo 08/06/12: EF 25%, mod diast dysfxn, AVR ok, mild MR, mod LAE, mild RAE, mild to mod RV systolic dysfunction  . HLD (hyperlipidemia)   . GERD (gastroesophageal reflux disease)   . Chronic kidney disease   . Pleural effusion    a. post-operatively after AVR/CABG s/p thoracentesis 03/2012 yielding 1L serosanguinous fluid.  . Diabetes mellitus     borderline  . Blood transfusion     NO REACTION TO TRANSFUSION  . Cellulitis     a. RLE cellulitis 2 months post-operatively after AVR/CABG - serratia marcessans, tx with I&D/antibiotics  . Mitral regurgitation     Moderate by TEE 01/2012  . Flash pulmonary edema ([place])     Post-cath 12/2011, went into acute pulm edema requiring IV lasix and intubation  . Baker's cyst 07/23/12    Incidental finding of LE venous dopplers  . Cataract     right eye, hx of  . HTN (hypertension)     primary, Dr. Hollace Kinnier  . Myocardial infarction ([place]) 12/2011  . CHF (congestive heart failure) ([place])   . Stroke ([place]) 10/01    RIght Leg weakness  . Cancer [facility]) '90's    Colon  . Anemia   . Anxiety     Past Surgical History  Procedure Laterality Date  . Colon resection  1996  . Esophageal dilation    . Colonoscopy    . Cataract extraction      rt  . Cardiac catheterization      1.3.13  stopped breathing, put on ventilator for 5-6 days  . Tonsillectomy    .  Aortic valve replacement  02/07/2012    Procedure: AORTIC VALVE REPLACEMENT (AVR);  Surgeon: Gaye Pollack, MD;  Location: [place];  Service: Open Heart Surgery;  Laterality: N/A;  . Coronary artery bypass graft  02/07/2012    Procedure: CORONARY ARTERY BYPASS GRAFTING (CABG);  Surgeon: Gaye Pollack, MD;  Location: [place];  Service: Open Heart Surgery;  Laterality: N/A;  CABG x three; using right leg greater saphenous vein harvested endoscopically  . Chest tube insertion  07/24/2012    Procedure: INSERTION PLEURAL DRAINAGE CATHETER;  Surgeon: Gaye Pollack, MD;  Location: [place];  Service: Thoracic;  Laterality: Left;  . Talc pleurodesis  08/30/2012    Procedure: TALC PLEURADESIS;  Surgeon: Gaye Pollack, MD;  Location: [place];  Service: Thoracic;  Laterality: Left;  INSERTION OF TALC VIA LEFT PLEURX  . Talc pleurodesis  09/12/2012      Procedure: TALC PLEURADESIS;  Surgeon: Gaye Pollack, MD;  Location: [place];  Service: Thoracic;  Laterality: Left;  . Removal of pleural drainage catheter  09/27/2012    Procedure: REMOVAL OF PLEURAL DRAINAGE CATHETER;  Surgeon: Gaye Pollack, MD;  Location: [place];  Service: Thoracic;  Laterality: Left;  Marland Kitchen Eye surgery  2009    cataract removed from right eye  . Esophagogastroduodenoscopy N/A 07/03/2014    Procedure: ESOPHAGOGASTRODUODENOSCOPY (EGD);  Surgeon: Arta Silence, MD;  Location: [facility] ENDOSCOPY;  Service: Endoscopy;  Laterality: N/A;  . Esophagoscopy with dilitation N/A 07/07/2014    Procedure: ESOPHAGOSCOPY WITH DILITATION/SAVARY DILATOR;  Surgeon: Rozetta Nunnery, MD;  Location: [place];  Service: ENT;  Laterality: N/A;  . Ercp N/A 07/08/2014    Procedure: ENDOSCOPIC RETROGRADE CHOLANGIOPANCREATOGRAPHY (ERCP);  Surgeon: Missy Sabins, MD;  Location: [facility] ENDOSCOPY;  Service: Endoscopy;  Laterality: N/A;  . Left heart catheterization with coronary angiogram N/A 12/13/2011    Procedure: LEFT HEART CATHETERIZATION WITH CORONARY ANGIOGRAM;  Surgeon: Peter M Martinique, MD;  Location: [facility] CATH LAB;  Service: Cardiovascular;  Laterality: N/A;  . Inguinal hernia repair Right 09/13/2015    Procedure: OPEN RIGHT INGUINAL HERNIA;  Surgeon: Mickeal Skinner, MD;  Location: [place];  Service: General;  Laterality: Right;  . Insertion of mesh Right 09/13/2015    Procedure: INSERTION OF MESH;  Surgeon: Mickeal Skinner, MD;  Location: [place];  Service: General;  Laterality: Right;    Allergies  Allergen Reactions  . Statins Other (See Comments)    Pain/weakness in legs      Medication List       This list is accurate as of: 05/15/16  1:22 PM.  Always use your most recent med list.               AMBULATORY NON FORMULARY MEDICATION  Freestyle lite test strips Use to test blood sugar once daily. Dx E11.8     aspirin EC 325 MG tablet  Take 325 mg by mouth daily.     carvedilol 25 MG  tablet  Commonly known as:  COREG  TAKE 1 TABLET BY MOUTH TWICE DAILY WITH MEALS     cholecalciferol 1000 units tablet  Commonly known as:  VITAMIN D  Take 1,000 Units by mouth daily with supper.     freestyle lancets  Use one lancet in morning before breakfast to check blood sugar.  Dx E11.8     furosemide 40 MG tablet  Commonly known as:  LASIX  TAKE 2 TABLETS BY MOUTH DAILY     glimepiride  1 MG tablet  Commonly known as:  AMARYL  TAKE 1 TABLET BY MOUTH DAILY WITH BREAKFAST     hydrALAZINE 25 MG tablet  Commonly known as:  APRESOLINE  TAKE 1 TABLET BY MOUTH 3 TIMES A DAY     isosorbide mononitrate 60 MG 24 hr tablet  Commonly known as:  IMDUR  TAKE 1 TABLET BY MOUTH EVERY DAY     multivitamin with minerals Tabs tablet  Take 1 tablet by mouth daily.     nitroGLYCERIN 0.4 MG SL tablet  Commonly known as:  NITROSTAT  Place 0.4 mg under the tongue every 5 (five) minutes as needed for chest pain. As needed     omeprazole 20 MG capsule  Commonly known as:  PRILOSEC  Take 20 mg by mouth daily.     tamsulosin 0.4 MG Caps capsule  Commonly known as:  FLOMAX  TAKE 1 CAPSULE BY MOUTH EVERY DAY AFTER DINNER     Travoprost (BAK Free) 0.004 % Soln ophthalmic solution  Commonly known as:  TRAVATAN  Place 1 drop into both eyes daily.        Review of Systems:  Review of Systems  Constitutional: Negative for fever and chills.  HENT: Positive for hearing loss. Negative for congestion.   Eyes: Negative for blurred vision.  Respiratory: Negative for shortness of breath.   Cardiovascular: Negative for chest pain, palpitations and leg swelling.  Gastrointestinal: Negative for abdominal pain, constipation, blood in stool and melena.  Genitourinary: Negative for dysuria, urgency and frequency.  Musculoskeletal: Negative for myalgias, joint pain and falls.       Right leg still weak  Skin: Negative for rash.  Neurological: Negative for dizziness, loss of consciousness and  headaches.  Endo/Heme/Allergies: Bruises/bleeds easily.  Psychiatric/Behavioral: Positive for memory loss. Negative for depression. The patient is not nervous/anxious and does not have insomnia.     Health Maintenance  Topic Date Due  . FOOT EXAM  02/15/2016  . OPHTHALMOLOGY EXAM  03/25/2016  . INFLUENZA VACCINE  07/11/2016  . HEMOGLOBIN A1C  11/09/2016  . URINE MICROALBUMIN  02/09/2017  . TETANUS/TDAP  08/25/2025  . ZOSTAVAX  Completed  . PNA vac Low Risk Adult  Completed    Physical Exam: Filed Vitals:   05/15/16 1314  BP: 150/68  Pulse: 58  Temp: 98.2 F (36.8 C)  TempSrc: Oral  Weight: 154 lb (69.854 kg)  SpO2: 98%   Body mass index is 24.49 kg/(m^2). Physical Exam  Constitutional: He is oriented to person, place, and time. He appears well-developed and well-nourished. No distress.  Eyes:  glasses  Cardiovascular: Normal rate, regular rhythm and intact distal pulses.   Murmur heard. 3/6 systolic murmur audible throughout precordium  Pulmonary/Chest: Effort normal and breath sounds normal. No respiratory distress.  Musculoskeletal: Normal range of motion.  Slight limp due to chronic RLE weakness  Neurological: He is alert and oriented to person, place, and time.  Skin: Skin is warm and dry.  Psychiatric: He has a normal mood and affect.    Labs reviewed: Basic Metabolic Panel:  Recent Labs  09/20/15 1550  10/22/15 2200  11/19/15 1210 02/07/16 0902 05/10/16 1001 05/10/16 1122  NA  --   < > 136  < > 138 143 146* 144  K  --   < > 3.9  < > 4.7 4.8 4.9 4.8  CL  --   < > 100*  < > 102 99 102  --   CO2  --   < >  25  < > 26 27 27  31*  GLUCOSE  --   < > 183*  < > 108* 88 113* 222*  BUN  --   < > 48*  < > 35* 46* 51* 52.8*  CREATININE  --   < > 2.68*  < > 2.21* 2.42* 2.49* 2.7*  CALCIUM  --   < > 8.9  < > 9.5 9.7 9.9 9.8  MG  --   --  2.3  --   --   --   --   --   PHOS 2.7  --   --   --   --   --   --   --   < > = values in this interval not displayed. Liver  Function Tests:  Recent Labs  10/22/15 2200 11/10/15 1442 05/10/16 1122  AST 27 18 15   ALT 16* 9 17  ALKPHOS 54 50 55  BILITOT 0.7 0.48 0.82  PROT 6.6 7.0 7.7  ALBUMIN 3.0* 3.3* 3.9   No results for input(s): LIPASE, AMYLASE in the last 8760 hours. No results for input(s): AMMONIA in the last 8760 hours. CBC:  Recent Labs  11/10/15 1442 02/02/16 1353 02/07/16 0902 05/10/16 1122  WBC 6.0 7.1 7.0 6.5  NEUTROABS 3.3 3.3 3.7 3.8  HGB 10.9* 11.9*  --  12.8*  HCT 33.3* 35.5* 39.6 38.9  MCV 100.0* 98.1* 97 99.6*  PLT 127* 125* 164 124*   Lipid Panel:  Recent Labs  02/07/16 0902  CHOL 178  HDL 34*  LDLCALC 116*  TRIG 141  CHOLHDL 5.2*   Lab Results  Component Value Date   HGBA1C 5.9* 05/10/2016    Assessment/Plan 1. Essential hypertension -bp is well controlled on recheck at 130/60 after appt otherwise over -cont same regimen  2. Chronic combined systolic and diastolic heart failure, NYHA class 2 (HCC) -weight stable, no increase in his dyspnea -cont same regimen  3. Controlled type 2 diabetes mellitus with complication, without long-term current use of insulin ([place]) -doing well with current regimen, cont same w/o changes and same monitoring  4. Chronic kidney disease, stage III (moderate): Cr ~2.2 baseline -cont to monitor, avoid nsaids and nephrotoxic agents  5. Anemia due to chronic kidney disease -cont to monitor, has been stable  Labs/tests ordered:  No new Next appt:  4 mos med mgt  Carleta Woodrow L. Shary Lamos, D.O. [facility] [address], [place] [address] Cell Phone (Mon-Fri 8am-5pm):  [phone] On Call:  [phone] & follow prompts after 5pm & weekends Office Phone:  [phone] Office Fax:  [phone]

## 2016-05-29 ENCOUNTER — Other Ambulatory Visit: Payer: Self-pay | Admitting: Internal Medicine

## 2016-06-09 ENCOUNTER — Other Ambulatory Visit: Payer: Self-pay | Admitting: Internal Medicine

## 2016-06-23 ENCOUNTER — Other Ambulatory Visit: Payer: Self-pay | Admitting: Internal Medicine

## 2016-08-07 ENCOUNTER — Other Ambulatory Visit: Payer: Self-pay | Admitting: Internal Medicine

## 2016-08-21 ENCOUNTER — Ambulatory Visit: Payer: Medicare Other | Admitting: Internal Medicine

## 2016-08-25 ENCOUNTER — Other Ambulatory Visit: Payer: Self-pay | Admitting: Internal Medicine

## 2016-08-30 DIAGNOSIS — Z23 Encounter for immunization: Secondary | ICD-10-CM | POA: Diagnosis not present

## 2016-09-14 ENCOUNTER — Encounter: Payer: Self-pay | Admitting: Internal Medicine

## 2016-09-14 ENCOUNTER — Ambulatory Visit (INDEPENDENT_AMBULATORY_CARE_PROVIDER_SITE_OTHER): Payer: Medicare Other | Admitting: Internal Medicine

## 2016-09-14 VITALS — BP 112/60 | HR 57 | Temp 98.5°F | Ht 67.0 in | Wt 156.0 lb

## 2016-09-14 DIAGNOSIS — I10 Essential (primary) hypertension: Secondary | ICD-10-CM | POA: Diagnosis not present

## 2016-09-14 DIAGNOSIS — I5042 Chronic combined systolic (congestive) and diastolic (congestive) heart failure: Secondary | ICD-10-CM

## 2016-09-14 DIAGNOSIS — I25110 Atherosclerotic heart disease of native coronary artery with unstable angina pectoris: Secondary | ICD-10-CM

## 2016-09-14 DIAGNOSIS — B354 Tinea corporis: Secondary | ICD-10-CM | POA: Diagnosis not present

## 2016-09-14 DIAGNOSIS — E118 Type 2 diabetes mellitus with unspecified complications: Secondary | ICD-10-CM

## 2016-09-14 DIAGNOSIS — C44629 Squamous cell carcinoma of skin of left upper limb, including shoulder: Secondary | ICD-10-CM | POA: Diagnosis not present

## 2016-09-14 DIAGNOSIS — N183 Chronic kidney disease, stage 3 unspecified: Secondary | ICD-10-CM

## 2016-09-14 MED ORDER — CLOTRIMAZOLE 1 % EX CREA: 1.0000 "application " | TOPICAL_CREAM | Freq: Two times a day (BID) | CUTANEOUS | 0 refills | Status: DC

## 2016-09-14 NOTE — Progress Notes (Signed)
Location:  [facility] clinic Provider:  Karynn Deblasi L. Mariea Clonts, D.O., C.M.D.  Code Status: DNR Goals of Care:  Advanced Directives 09/14/2016  Does patient have an advance directive? Yes  Type of Advance Directive Living will;Out of facility DNR (pink MOST or yellow form)  Does patient want to make changes to advanced directive? -  Copy of advanced directive(s) in chart? Yes  Would patient like information on creating an advanced directive? -  Pre-existing out of facility DNR order (yellow form or pink MOST form) Pink MOST form placed in chart (order not valid for inpatient use)     Chief Complaint  Patient presents with  . Medical Management of Chronic Issues    4 mth follow-up    HPI: Patient is a 82 y.o. male seen today for medical management of chronic diseases.    Had flu shot [facility] at [facility].   Pt is frail and has muscle loss so statin therapy will worsen his weakness.  He has an allergy listed. He is on hydralazine and nitrates instead of ace/arb due to advanced CKD.  No new problems.  Says he's been perfect since his last visit, even his attitude.    104-128 fasting glucose.  No lows.    No chest pain or shortness of breath.  Walking for exercise--2-3x per day.  Will have to walk inside the apts when it gets cool out.    No difficulty passing urine, just happens often.    Past Medical History:  Diagnosis Date  . Anemia   . Anxiety   . Aortic stenosis    a. s/p AVR with 74mm Edwards pericardial valve 02/07/12 - post-op course complicated by pleural effusion requring thoracentesis/leg cellulitis 03/2012.  . Baker's cyst 07/23/12   Incidental finding of LE venous dopplers  . Blood transfusion    NO REACTION TO TRANSFUSION  . CAD (coronary artery disease)    a. s/p NSTEMI 12/2011:  LHC - Ostial left main 20%, ostial LAD 50%, mid 60-70%, ostial D1 40% and mid 40%, D2 70%, ostial circumflex occluded, ostial RCA 80-90%, LVEDP was 42. b.  s/p CABG x 3 at time of AVR (LiMA-LAD,  SVG-2nd daigonal, SVG-PDA) 02/07/12 (post-op course noted above).  . Cancer [facility]) '90's   Colon  . Cataract    right eye, hx of  . Cellulitis    a. RLE cellulitis 2 months post-operatively after AVR/CABG - serratia marcessans, tx with I&D/antibiotics  . CHF (congestive heart failure) ([place])   . Chronic kidney disease   . Chronic systolic heart failure ([place])    a. TEE 01/2012: EF 25-30%, diffuse hypokinesis. b. Not on ACEI due to renal insufficiency.;  c. follow up  echo 08/06/12: EF 25%, mod diast dysfxn, AVR ok, mild MR, mod LAE, mild RAE, mild to mod RV systolic dysfunction  . Diabetes mellitus    borderline  . Flash pulmonary edema ([place])    Post-cath 12/2011, went into acute pulm edema requiring IV lasix and intubation  . GERD (gastroesophageal reflux disease)   . History of colon cancer    s/p colon resection  . HLD (hyperlipidemia)   . HTN (hypertension)    primary, Dr. Hollace Kinnier  . Ischemic cardiomyopathy   . Mitral regurgitation    Moderate by TEE 01/2012  . Myocardial infarction 12/2011  . Pleural effusion    a. post-operatively after AVR/CABG s/p thoracentesis 03/2012 yielding 1L serosanguinous fluid.  . Stroke ([place]) 10/01   RIght Leg weakness    Past  Surgical History:  Procedure Laterality Date  . AORTIC VALVE REPLACEMENT  02/07/2012   Procedure: AORTIC VALVE REPLACEMENT (AVR);  Surgeon: Gaye Pollack, MD;  Location: [place];  Service: Open Heart Surgery;  Laterality: N/A;  . CARDIAC CATHETERIZATION     1.3.13  stopped breathing, put on ventilator for 5-6 days  . CATARACT EXTRACTION     rt  . CHEST TUBE INSERTION  07/24/2012   Procedure: INSERTION PLEURAL DRAINAGE CATHETER;  Surgeon: Gaye Pollack, MD;  Location: [place];  Service: Thoracic;  Laterality: Left;  . COLON RESECTION  1996  . COLONOSCOPY    . CORONARY ARTERY BYPASS GRAFT  02/07/2012   Procedure: CORONARY ARTERY BYPASS GRAFTING (CABG);  Surgeon: Gaye Pollack, MD;  Location: [place];  Service: Open Heart Surgery;   Laterality: N/A;  CABG x three; using right leg greater saphenous vein harvested endoscopically  . ERCP N/A 07/08/2014   Procedure: ENDOSCOPIC RETROGRADE CHOLANGIOPANCREATOGRAPHY (ERCP);  Surgeon: Missy Sabins, MD;  Location[facility] ENDOSCOPY;  Service: Endoscopy;  Laterality: N/A;  . ESOPHAGEAL DILATION    . ESOPHAGOGASTRODUODENOSCOPY N/A 07/03/2014   Procedure: ESOPHAGOGASTRODUODENOSCOPY (EGD);  Surgeon: Arta Silence, MD;  Location: [facility] ENDOSCOPY;  Service: Endoscopy;  Laterality: N/A;  . ESOPHAGOSCOPY WITH DILITATION N/A 07/07/2014   Procedure: ESOPHAGOSCOPY WITH DILITATION/SAVARY DILATOR;  Surgeon: Rozetta Nunnery, MD;  Location: [facility] OR;  Service: ENT;  Laterality: N/A;  . EYE SURGERY  2009   cataract removed from right eye  . INGUINAL HERNIA REPAIR Right 09/13/2015   Procedure: OPEN RIGHT INGUINAL HERNIA;  Surgeon: Mickeal Skinner, MD;  Location: [place];  Service: General;  Laterality: Right;  . INSERTION OF MESH Right 09/13/2015   Procedure: INSERTION OF MESH;  Surgeon: Mickeal Skinner, MD;  Location: [place];  Service: General;  Laterality: Right;  . LEFT HEART CATHETERIZATION WITH CORONARY ANGIOGRAM N/A 12/13/2011   Procedure: LEFT HEART CATHETERIZATION WITH CORONARY ANGIOGRAM;  Surgeon: Peter M Martinique, MD;  Location: [facility] CATH LAB;  Service: Cardiovascular;  Laterality: N/A;  . REMOVAL OF PLEURAL DRAINAGE CATHETER  09/27/2012   Procedure: REMOVAL OF PLEURAL DRAINAGE CATHETER;  Surgeon: Gaye Pollack, MD;  Location: [place];  Service: Thoracic;  Laterality: Left;  . TALC PLEURODESIS  08/30/2012   Procedure: Pietro Cassis;  Surgeon: Gaye Pollack, MD;  Location: [place];  Service: Thoracic;  Laterality: Left;  INSERTION OF TALC VIA LEFT PLEURX  . TALC PLEURODESIS  09/12/2012   Procedure: Pietro Cassis;  Surgeon: Gaye Pollack, MD;  Location: [place];  Service: Thoracic;  Laterality: Left;  . TONSILLECTOMY      Allergies  Allergen Reactions  . Statins Other (See Comments)     Pain/weakness in legs      Medication List       Accurate as of 09/14/16  1:31 PM. Always use your most recent med list.          AMBULATORY NON FORMULARY MEDICATION Freestyle lite test strips Use to test blood sugar once daily. Dx E11.8   aspirin EC 325 MG tablet Take 325 mg by mouth daily.   carvedilol 25 MG tablet Commonly known as:  COREG TAKE 1 TABLET BY MOUTH TWICE DAILY WITH MEALS   cholecalciferol 1000 units tablet Commonly known as:  VITAMIN D Take 1,000 Units by mouth daily with supper.   freestyle lancets Use one lancet in morning before breakfast to check blood sugar.  Dx E11.8   furosemide 40 MG tablet Commonly  known as:  LASIX TAKE 2 TABLETS BY MOUTH DAILY   glimepiride 1 MG tablet Commonly known as:  AMARYL TAKE 1 TABLET BY MOUTH DAILY WITH BREAKFAST   hydrALAZINE 25 MG tablet Commonly known as:  APRESOLINE TAKE 1 TABLET BY MOUTH THREE TIMES DAILY   isosorbide mononitrate 60 MG 24 hr tablet Commonly known as:  IMDUR TAKE 1 TABLET BY MOUTH EVERY DAY   multivitamin with minerals Tabs tablet Take 1 tablet by mouth daily.   nitroGLYCERIN 0.4 MG SL tablet Commonly known as:  NITROSTAT Place 0.4 mg under the tongue every 5 (five) minutes as needed for chest pain. As needed   omeprazole 20 MG capsule Commonly known as:  PRILOSEC Take 20 mg by mouth daily.   tamsulosin 0.4 MG Caps capsule Commonly known as:  FLOMAX TAKE 1 CAPSULE BY MOUTH EVERY DAY AFTER DINNER   Travoprost (BAK Free) 0.004 % Soln ophthalmic solution Commonly known as:  TRAVATAN Place 1 drop into both eyes daily.       Review of Systems:  Review of Systems  Constitutional: Negative for chills, fever and malaise/fatigue.  HENT: Positive for hearing loss.   Eyes: Negative for blurred vision.  Respiratory: Negative for shortness of breath.   Cardiovascular: Negative for chest pain and palpitations.  Gastrointestinal: Negative for abdominal pain, blood in stool, constipation  and melena.  Genitourinary: Negative for dysuria.  Musculoskeletal: Negative for falls.       Walks with limp  Skin: Positive for itching and rash.       Small patch in the middle of his back, raised, pruritic; also open nonhealing area on left upper arm for several months  Neurological: Negative for dizziness, loss of consciousness and weakness.  Endo/Heme/Allergies: Bruises/bleeds easily.  Psychiatric/Behavioral: Positive for memory loss. Negative for depression.    Health Maintenance  Topic Date Due  . OPHTHALMOLOGY EXAM  03/25/2016  . INFLUENZA VACCINE  07/11/2016  . HEMOGLOBIN A1C  11/09/2016  . URINE MICROALBUMIN  02/09/2017  . FOOT EXAM  05/15/2017  . TETANUS/TDAP  08/25/2025  . ZOSTAVAX  Completed  . PNA vac Low Risk Adult  Completed    Physical Exam: Vitals:   09/14/16 1322  BP: 112/60  Pulse: (!) 57  Temp: 98.5 F (36.9 C)  TempSrc: Oral  SpO2: 98%  Weight: 156 lb (70.8 kg)  Height: 5\' 7"  (1.702 m)   Body mass index is 24.43 kg/m. Physical Exam  Constitutional: He is oriented to person, place, and time. No distress.  Cardiovascular: Normal rate, regular rhythm and intact distal pulses.   Murmur heard. Pulmonary/Chest: Effort normal and breath sounds normal. No respiratory distress.  Abdominal: Soft. Bowel sounds are normal.  Musculoskeletal: Normal range of motion. He exhibits no edema.  Walks with limp  Neurological: He is alert and oriented to person, place, and time.  Skin: Skin is warm and dry. Capillary refill takes less than 2 seconds.  Psychiatric: He has a normal mood and affect.    Labs reviewed: Basic Metabolic Panel:  Recent Labs  09/20/15 1550  10/22/15 2200  11/19/15 1210 02/07/16 0902 05/10/16 1001 05/10/16 1122  NA  --   < > 136  < > 138 143 146* 144  K  --   < > 3.9  < > 4.7 4.8 4.9 4.8  CL  --   < > 100*  < > 102 99 102  --   CO2  --   < > 25  < > 26  27 27 31*  GLUCOSE  --   < > 183*  < > 108* 88 113* 222*  BUN  --   < > 48*   < > 35* 46* 51* 52.8*  CREATININE  --   < > 2.68*  < > 2.21* 2.42* 2.49* 2.7*  CALCIUM  --   < > 8.9  < > 9.5 9.7 9.9 9.8  MG  --   --  2.3  --   --   --   --   --   PHOS 2.7  --   --   --   --   --   --   --   < > = values in this interval not displayed. Liver Function Tests:  Recent Labs  10/22/15 2200 11/10/15 1442 05/10/16 1122  AST 27 18 15   ALT 16* 9 17  ALKPHOS 54 50 55  BILITOT 0.7 0.48 0.82  PROT 6.6 7.0 7.7  ALBUMIN 3.0* 3.3* 3.9   No results for input(s): LIPASE, AMYLASE in the last 8760 hours. No results for input(s): AMMONIA in the last 8760 hours. CBC:  Recent Labs  11/10/15 1442 02/02/16 1353 02/07/16 0902 05/10/16 1122  WBC 6.0 7.1 7.0 6.5  NEUTROABS 3.3 3.3 3.7 3.8  HGB 10.9* 11.9*  --  12.8*  HCT 33.3* 35.5* 39.6 38.9  MCV 100.0* 98.1* 97 99.6*  PLT 127* 125* 164 124*   Lipid Panel:  Recent Labs  02/07/16 0902  CHOL 178  HDL 34*  LDLCALC 116*  TRIG 141  CHOLHDL 5.2*   Lab Results  Component Value Date   HGBA1C 5.9 (H) 05/10/2016   Assessment/Plan 1. Tinea corporis midback - clotrimazole (LOTRIMIN) 1 % cream; Apply 1 application topically 2 (two) times daily. Dispense: 30 g; Refill: 0 Use until rash resolves Call back if it doesn't  2. Squamous cell carcinoma of skin of left upper arm -pt and his wife don't want him to go through more surgery  3. Chronic combined systolic and diastolic heart failure, NYHA class 2 (HCC) - has some dyspnea on exertion longstanding (he is not aware of his hard breathing) -cont same regimen and monitor, stable longstanding - Basic metabolic panel  4. Controlled type 2 diabetes mellitus with complication, without long-term current use of insulin (HCC) - sugar wellcontrolled based on his home readings (see hpi) - f/u labs: - Basic metabolic panel - Hemoglobin A1c -no difficulty with hypoglycemia on amaryl so continued  5. Chronic kidney disease, stage III (moderate): Cr ~2.2 baseline - has been  stable, recheck today, avoid nsaids and nephrotoxic agents - Basic metabolic panel  6. Essential hypertension - bp well controlled with current regimen - Basic metabolic panel  Labs/tests ordered:   Orders Placed This Encounter  Procedures  . Basic metabolic panel  . Hemoglobin A1c   Next appt:  12/14/2016 for med mgt   Leler Brion L. Salaam Battershell, D.O. [facility] [address], [place] [address] Cell Phone (Mon-Fri 8am-5pm):  [phone] On Call:  [phone] & follow prompts after 5pm & weekends Office Phone:  [phone] Office Fax:  [phone]

## 2016-09-15 ENCOUNTER — Encounter: Payer: Self-pay | Admitting: *Deleted

## 2016-09-15 LAB — BASIC METABOLIC PANEL
BUN: 44 mg/dL — ABNORMAL HIGH (ref 7–25)
CO2: 29 mmol/L (ref 20–31)
Calcium: 9.2 mg/dL (ref 8.6–10.3)
Chloride: 99 mmol/L (ref 98–110)
Creat: 2.45 mg/dL — ABNORMAL HIGH (ref 0.70–1.11)
Glucose, Bld: 93 mg/dL (ref 65–99)
Potassium: 4.9 mmol/L (ref 3.5–5.3)
Sodium: 138 mmol/L (ref 135–146)

## 2016-09-15 LAB — HEMOGLOBIN A1C
Hgb A1c MFr Bld: 5.6 % (ref <5.7)
Mean Plasma Glucose: 114 mg/dL

## 2016-09-19 ENCOUNTER — Telehealth: Payer: Self-pay

## 2016-09-19 NOTE — Telephone Encounter (Signed)
A fax was received from [facility] (Diabetic Detailed Written Order) that requested information regarding patient's use of diabetic supplies.   Form was completed and placed in Dr. Cyndi Lennert folder for signing.

## 2016-10-02 DIAGNOSIS — H401111 Primary open-angle glaucoma, right eye, mild stage: Secondary | ICD-10-CM | POA: Diagnosis not present

## 2016-10-02 DIAGNOSIS — H2512 Age-related nuclear cataract, left eye: Secondary | ICD-10-CM | POA: Diagnosis not present

## 2016-10-02 DIAGNOSIS — H5340 Unspecified visual field defects: Secondary | ICD-10-CM | POA: Diagnosis not present

## 2016-10-02 DIAGNOSIS — H401122 Primary open-angle glaucoma, left eye, moderate stage: Secondary | ICD-10-CM | POA: Diagnosis not present

## 2016-11-09 ENCOUNTER — Other Ambulatory Visit: Payer: Self-pay | Admitting: Internal Medicine

## 2016-11-09 ENCOUNTER — Other Ambulatory Visit (HOSPITAL_COMMUNITY): Payer: Self-pay | Admitting: *Deleted

## 2016-11-09 MED ORDER — FUROSEMIDE 40 MG PO TABS: 80.0000 mg | ORAL_TABLET | Freq: Every day | ORAL | 2 refills | Status: DC

## 2016-11-24 ENCOUNTER — Other Ambulatory Visit: Payer: Self-pay | Admitting: Internal Medicine

## 2016-12-01 ENCOUNTER — Other Ambulatory Visit: Payer: Self-pay | Admitting: Internal Medicine

## 2016-12-07 ENCOUNTER — Other Ambulatory Visit: Payer: Self-pay | Admitting: Internal Medicine

## 2016-12-14 ENCOUNTER — Ambulatory Visit (INDEPENDENT_AMBULATORY_CARE_PROVIDER_SITE_OTHER): Payer: Medicare Other | Admitting: Internal Medicine

## 2016-12-14 ENCOUNTER — Encounter: Payer: Self-pay | Admitting: Internal Medicine

## 2016-12-14 VITALS — BP 152/82 | HR 57 | Temp 97.8°F | Ht 67.0 in | Wt 160.0 lb

## 2016-12-14 DIAGNOSIS — E118 Type 2 diabetes mellitus with unspecified complications: Secondary | ICD-10-CM

## 2016-12-14 DIAGNOSIS — N183 Chronic kidney disease, stage 3 unspecified: Secondary | ICD-10-CM

## 2016-12-14 DIAGNOSIS — B354 Tinea corporis: Secondary | ICD-10-CM

## 2016-12-14 DIAGNOSIS — I5042 Chronic combined systolic (congestive) and diastolic (congestive) heart failure: Secondary | ICD-10-CM

## 2016-12-14 DIAGNOSIS — N189 Chronic kidney disease, unspecified: Secondary | ICD-10-CM | POA: Diagnosis not present

## 2016-12-14 DIAGNOSIS — T148XXA Other injury of unspecified body region, initial encounter: Secondary | ICD-10-CM

## 2016-12-14 DIAGNOSIS — I10 Essential (primary) hypertension: Secondary | ICD-10-CM

## 2016-12-14 DIAGNOSIS — D631 Anemia in chronic kidney disease: Secondary | ICD-10-CM

## 2016-12-14 NOTE — Progress Notes (Signed)
Location:  [facility] clinic Provider:  Serene Kopf L. Mariea Clonts, D.O., C.M.D.  Code Status: DNR Goals of Care:  Advanced Directives 12/14/2016  Does Patient Have a Medical Advance Directive? Yes  Type of Advance Directive Living will;Out of facility DNR (pink MOST or yellow form)  Does patient want to make changes to medical advance directive? -  Copy of [place] in Chart? -  Would patient like information on creating a medical advance directive? -  Pre-existing out of facility DNR order (yellow form or pink MOST form) Pink MOST form placed in chart (order not valid for inpatient use)     Chief Complaint  Patient presents with  . Medical Management of Chronic Issues    3 month medication management. Here with wife  . Skin Cancer    on left arm, wife keeps putting a bandage on it.     HPI: Patient is a 83 y.o. male seen today for medical management of chronic diseases.    CBGs 100s-130s.  CHF:  Weighs each am and that's been stable at home.  156 here last time and 160 today.  Took him a while to recover after removing his shirt and putting it back on for me to look at lesions on his left arm and back.  Continues to walk even inside for exercise--does get winded.  No swelling of ankles.    Stopped doing bp at home b/c they were always good at home.  Slightly up today.    Memory not obviously different to his wife.    Urinates hourly in the daytime but only once at night.  No other urinary problems.  No incontinence.  No dizziness or lightheadedness.  No falls.    He refuses his second cataract surgery b/c he had trouble with the first one.  He's worried that it will cause a problem and he won't be able to drive anymore.  He had double vision after the first one but it did go away.  He's still reading and does not notice that it bothers him enough to get it fixed.    Hearing well.    Past Medical History:  Diagnosis Date  . Anemia   . Anxiety   . Aortic stenosis    a. s/p AVR with 8mm Edwards pericardial valve 02/07/12 - post-op course complicated by pleural effusion requring thoracentesis/leg cellulitis 03/2012.  . Baker's cyst 07/23/12   Incidental finding of LE venous dopplers  . Blood transfusion    NO REACTION TO TRANSFUSION  . CAD (coronary artery disease)    a. s/p NSTEMI 12/2011:  LHC - Ostial left main 20%, ostial LAD 50%, mid 60-70%, ostial D1 40% and mid 40%, D2 70%, ostial circumflex occluded, ostial RCA 80-90%, LVEDP was 42. b.  s/p CABG x 3 at time of AVR (LiMA-LAD, SVG-2nd daigonal, SVG-PDA) 02/07/12 (post-op course noted above).  . Cancer [facility]) '90's   Colon  . Cataract    right eye, hx of  . Cellulitis    a. RLE cellulitis 2 months post-operatively after AVR/CABG - serratia marcessans, tx with I&D/antibiotics  . CHF (congestive heart failure) ([place])   . Chronic kidney disease   . Chronic systolic heart failure ([place])    a. TEE 01/2012: EF 25-30%, diffuse hypokinesis. b. Not on ACEI due to renal insufficiency.;  c. follow up  echo 08/06/12: EF 25%, mod diast dysfxn, AVR ok, mild MR, mod LAE, mild RAE, mild to mod RV systolic dysfunction  . Diabetes  mellitus    borderline  . Flash pulmonary edema ([place])    Post-cath 12/2011, went into acute pulm edema requiring IV lasix and intubation  . GERD (gastroesophageal reflux disease)   . History of colon cancer    s/p colon resection  . HLD (hyperlipidemia)   . HTN (hypertension)    primary, Dr. Hollace Kinnier  . Ischemic cardiomyopathy   . Mitral regurgitation    Moderate by TEE 01/2012  . Myocardial infarction 12/2011  . Pleural effusion    a. post-operatively after AVR/CABG s/p thoracentesis 03/2012 yielding 1L serosanguinous fluid.  . Stroke ([place]) 10/01   RIght Leg weakness    Past Surgical History:  Procedure Laterality Date  . AORTIC VALVE REPLACEMENT  02/07/2012   Procedure: AORTIC VALVE REPLACEMENT (AVR);  Surgeon: Gaye Pollack, MD;  Location: [facility];  Service: Open Heart Surgery;   Laterality: N/A;  . CARDIAC CATHETERIZATION     1.3.13  stopped breathing, put on ventilator for 5-6 days  . CATARACT EXTRACTION     rt  . CHEST TUBE INSERTION  07/24/2012   Procedure: INSERTION PLEURAL DRAINAGE CATHETER;  Surgeon: Gaye Pollack, MD;  Location: [place];  Service: Thoracic;  Laterality: Left;  . COLON RESECTION  1996  . COLONOSCOPY    . CORONARY ARTERY BYPASS GRAFT  02/07/2012   Procedure: CORONARY ARTERY BYPASS GRAFTING (CABG);  Surgeon: Gaye Pollack, MD;  Location: [place];  Service: Open Heart Surgery;  Laterality: N/A;  CABG x three; using right leg greater saphenous vein harvested endoscopically  . ERCP N/A 07/08/2014   Procedure: ENDOSCOPIC RETROGRADE CHOLANGIOPANCREATOGRAPHY (ERCP);  Surgeon: Missy Sabins, MD;  Location: [facility] ENDOSCOPY;  Service: Endoscopy;  Laterality: N/A;  . ESOPHAGEAL DILATION    . ESOPHAGOGASTRODUODENOSCOPY N/A 07/03/2014   Procedure: ESOPHAGOGASTRODUODENOSCOPY (EGD);  Surgeon: Arta Silence, MD;  Location: [facility] ENDOSCOPY;  Service: Endoscopy;  Laterality: N/A;  . ESOPHAGOSCOPY WITH DILITATION N/A 07/07/2014   Procedure: ESOPHAGOSCOPY WITH DILITATION/SAVARY DILATOR;  Surgeon: Rozetta Nunnery, MD;  Location: [facility];  Service: ENT;  Laterality: N/A;  . EYE SURGERY  2009   cataract removed from right eye  . INGUINAL HERNIA REPAIR Right 09/13/2015   Procedure: OPEN RIGHT INGUINAL HERNIA;  Surgeon: Mickeal Skinner, MD;  Location: [place];  Service: General;  Laterality: Right;  . INSERTION OF MESH Right 09/13/2015   Procedure: INSERTION OF MESH;  Surgeon: Mickeal Skinner, MD;  Location: [place];  Service: General;  Laterality: Right;  . LEFT HEART CATHETERIZATION WITH CORONARY ANGIOGRAM N/A 12/13/2011   Procedure: LEFT HEART CATHETERIZATION WITH CORONARY ANGIOGRAM;  Surgeon: Peter M Martinique, MD;  Location: [facility] CATH LAB;  Service: Cardiovascular;  Laterality: N/A;  . REMOVAL OF PLEURAL DRAINAGE CATHETER  09/27/2012   Procedure: REMOVAL OF PLEURAL DRAINAGE  CATHETER;  Surgeon: Gaye Pollack, MD;  Location: [place];  Service: Thoracic;  Laterality: Left;  . TALC PLEURODESIS  08/30/2012   Procedure: Pietro Cassis;  Surgeon: Gaye Pollack, MD;  Location: [place];  Service: Thoracic;  Laterality: Left;  INSERTION OF TALC VIA LEFT PLEURX  . TALC PLEURODESIS  09/12/2012   Procedure: Pietro Cassis;  Surgeon: Gaye Pollack, MD;  Location: [place];  Service: Thoracic;  Laterality: Left;  . TONSILLECTOMY      Allergies  Allergen Reactions  . Statins Other (See Comments)    Pain/weakness in legs    Allergies as of 12/14/2016      Reactions   Statins Other (  See Comments)   Pain/weakness in legs      Medication List       Accurate as of 12/14/16  1:28 PM. Always use your most recent med list.          AMBULATORY NON FORMULARY MEDICATION Freestyle lite test strips Use to test blood sugar once daily. Dx E11.8   aspirin EC 325 MG tablet Take 325 mg by mouth daily.   carvedilol 25 MG tablet Commonly known as:  COREG TAKE 1 TABLET BY MOUTH TWICE DAILY WITH MEALS   cholecalciferol 1000 units tablet Commonly known as:  VITAMIN D Take 1,000 Units by mouth daily with supper.   clotrimazole 1 % cream Commonly known as:  LOTRIMIN Apply 1 application topically 2 (two) times daily.   freestyle lancets Use one lancet in morning before breakfast to check blood sugar.  Dx E11.8   furosemide 40 MG tablet Commonly known as:  LASIX Take 2 tablets (80 mg total) by mouth daily.   glimepiride 1 MG tablet Commonly known as:  AMARYL TAKE 1 TABLET BY MOUTH DAILY WITH BREAKFAST   hydrALAZINE 25 MG tablet Commonly known as:  APRESOLINE TAKE 1 TABLET BY MOUTH THREE TIMES DAILY   isosorbide mononitrate 60 MG 24 hr tablet Commonly known as:  IMDUR TAKE 1 TABLET BY MOUTH EVERY DAY   multivitamin with minerals Tabs tablet Take 1 tablet by mouth daily.   nitroGLYCERIN 0.4 MG SL tablet Commonly known as:  NITROSTAT Place 0.4 mg under the tongue  every 5 (five) minutes as needed for chest pain. As needed   omeprazole 20 MG capsule Commonly known as:  PRILOSEC Take 20 mg by mouth daily.   tamsulosin 0.4 MG Caps capsule Commonly known as:  FLOMAX TAKE 1 CAPSULE BY MOUTH EVERY DAY AFTER DINNER   Travoprost (BAK Free) 0.004 % Soln ophthalmic solution Commonly known as:  TRAVATAN Place 1 drop into both eyes daily.       Review of Systems:  Review of Systems  Constitutional: Negative for chills and fever.  HENT: Negative for hearing loss.   Eyes: Negative for blurred vision.       Has caratact but refuses intervention  Respiratory: Positive for shortness of breath. Negative for cough, sputum production and wheezing.   Cardiovascular: Negative for chest pain and palpitations.  Gastrointestinal: Negative for abdominal pain, blood in stool, constipation and melena.  Genitourinary: Positive for frequency. Negative for dysuria and urgency.  Musculoskeletal: Negative for falls, joint pain and myalgias.  Skin: Positive for rash. Negative for itching.       Left upper arm nonhealing area; midback raised red area  Neurological: Negative for dizziness and loss of consciousness.  Endo/Heme/Allergies: Bruises/bleeds easily.  Psychiatric/Behavioral: Positive for memory loss. Negative for depression.    Health Maintenance  Topic Date Due  . OPHTHALMOLOGY EXAM  03/25/2016  . URINE MICROALBUMIN  02/09/2017  . HEMOGLOBIN A1C  03/15/2017  . FOOT EXAM  05/15/2017  . TETANUS/TDAP  08/25/2025  . INFLUENZA VACCINE  Completed  . ZOSTAVAX  Completed  . PNA vac Low Risk Adult  Completed    Physical Exam: Vitals:   12/14/16 1311  BP: (!) 152/82  Pulse: (!) 57  Temp: 97.8 F (36.6 C)  TempSrc: Oral  SpO2: 98%  Weight: 160 lb (72.6 kg)  Height: 5\' 7"  (1.702 m)   Body mass index is 25.06 kg/m. Physical Exam  Constitutional: He is oriented to person, place, and time. He appears distressed.  Frail  white male  HENT:  Head:  Normocephalic and atraumatic.  Cardiovascular: Normal rate, regular rhythm, normal heart sounds and intact distal pulses.   Pulmonary/Chest: Effort normal and breath sounds normal. No respiratory distress. He has no rales.  Abdominal: Soft. Bowel sounds are normal.  Musculoskeletal: Normal range of motion. He exhibits no tenderness.  Neurological: He is alert and oriented to person, place, and time. No cranial nerve deficit.  Skin: Skin is warm and dry. Capillary refill takes less than 2 seconds.  Psychiatric: He has a normal mood and affect.    Labs reviewed: Basic Metabolic Panel:  Recent Labs  02/07/16 0902 05/10/16 1001 05/10/16 1122 09/14/16 1355  NA 143 146* 144 138  K 4.8 4.9 4.8 4.9  CL 99 102  --  99  CO2 27 27 31* 29  GLUCOSE 88 113* 222* 93  BUN 46* 51* 52.8* 44*  CREATININE 2.42* 2.49* 2.7* 2.45*  CALCIUM 9.7 9.9 9.8 9.2   Liver Function Tests:  Recent Labs  05/10/16 1122  AST 15  ALT 17  ALKPHOS 55  BILITOT 0.82  PROT 7.7  ALBUMIN 3.9   No results for input(s): LIPASE, AMYLASE in the last 8760 hours. No results for input(s): AMMONIA in the last 8760 hours. CBC:  Recent Labs  02/02/16 1353 02/07/16 0902 05/10/16 1122  WBC 7.1 7.0 6.5  NEUTROABS 3.3 3.7 3.8  HGB 11.9*  --  12.8*  HCT 35.5* 39.6 38.9  MCV 98.1* 97 99.6*  PLT 125* 164 124*   Lipid Panel:  Recent Labs  02/07/16 0902  CHOL 178  HDL 34*  LDLCALC 116*  TRIG 141  CHOLHDL 5.2*   Lab Results  Component Value Date   HGBA1C 5.6 09/14/2016    Assessment/Plan 1. Nonhealing nonsurgical wound - appears to be a skin cancer--last visit it looked more squamous, but now has raised pearly area suggestive of basal cell - Ambulatory referral to Dermatology  2. Tinea corporis -I thought that's what was on his midback, but using an antifungal has not gotten rid of it over 3 mos  - Ambulatory referral to Dermatology  3. Chronic combined systolic and diastolic heart failure, NYHA  class 2 ([place]) - seems he may have some progression of his dyspnea on exertion since last time, but he does not note it truly being different - COMPLETE METABOLIC PANEL WITH GFR; Future  4. Controlled type 2 diabetes mellitus with complication, without long-term current use of insulin (HCC) - continues on amaryl, cbgs at goal, no lows - Hemoglobin A1c; Future - Lipid panel; Future  5. Chronic kidney disease, stage III (moderate): Cr ~2.2 baseline - stable, f/u labs - COMPLETE METABOLIC PANEL WITH GFR; Future  6. Essential hypertension - a little elevated here today, but dyspneic from walking in - COMPLETE METABOLIC PANEL WITH GFR; Future  7. Anemia due to chronic kidney disease - f/u labs, no signs of drop in hgb  Labs/tests ordered:   Orders Placed This Encounter  Procedures  . COMPLETE METABOLIC PANEL WITH GFR    [facility]    Standing Status:   Future    Standing Expiration Date:   06/13/2017  . Hemoglobin A1c    Standing Status:   Future    Standing Expiration Date:   06/13/2017  . Lipid panel    Standing Status:   Future    Standing Expiration Date:   06/13/2017    Order Specific Question:   Has the patient fasted?  Answer:   Yes  . Ambulatory referral to Dermatology    Referral Priority:   Routine    Referral Type:   Consultation    Referral Reason:   Specialty Services Required    Requested Specialty:   Dermatology    Number of Visits Requested:   1   Next appt:  02/22/2017 med mgt, labs before   [place]. Camisha Srey, D.O. [facility] [address], [place] [address] Cell Phone (Mon-Fri 8am-5pm):  [phone] On Call:  [phone] & follow prompts after 5pm & weekends Office Phone:  [phone] Office Fax:  [phone]

## 2016-12-25 ENCOUNTER — Other Ambulatory Visit: Payer: Self-pay | Admitting: Internal Medicine

## 2017-01-01 DIAGNOSIS — D0339 Melanoma in situ of other parts of face: Secondary | ICD-10-CM | POA: Diagnosis not present

## 2017-01-01 DIAGNOSIS — D1801 Hemangioma of skin and subcutaneous tissue: Secondary | ICD-10-CM | POA: Diagnosis not present

## 2017-01-01 DIAGNOSIS — L905 Scar conditions and fibrosis of skin: Secondary | ICD-10-CM | POA: Diagnosis not present

## 2017-01-01 DIAGNOSIS — D0439 Carcinoma in situ of skin of other parts of face: Secondary | ICD-10-CM | POA: Diagnosis not present

## 2017-01-01 DIAGNOSIS — C44619 Basal cell carcinoma of skin of left upper limb, including shoulder: Secondary | ICD-10-CM | POA: Diagnosis not present

## 2017-01-01 DIAGNOSIS — D485 Neoplasm of uncertain behavior of skin: Secondary | ICD-10-CM | POA: Diagnosis not present

## 2017-01-01 DIAGNOSIS — L570 Actinic keratosis: Secondary | ICD-10-CM | POA: Diagnosis not present

## 2017-01-15 DIAGNOSIS — L989 Disorder of the skin and subcutaneous tissue, unspecified: Secondary | ICD-10-CM | POA: Diagnosis not present

## 2017-01-15 DIAGNOSIS — D0339 Melanoma in situ of other parts of face: Secondary | ICD-10-CM | POA: Diagnosis not present

## 2017-01-15 DIAGNOSIS — Z85828 Personal history of other malignant neoplasm of skin: Secondary | ICD-10-CM | POA: Diagnosis not present

## 2017-01-16 DIAGNOSIS — D0339 Melanoma in situ of other parts of face: Secondary | ICD-10-CM | POA: Diagnosis not present

## 2017-01-30 DIAGNOSIS — D485 Neoplasm of uncertain behavior of skin: Secondary | ICD-10-CM | POA: Diagnosis not present

## 2017-01-30 DIAGNOSIS — C4442 Squamous cell carcinoma of skin of scalp and neck: Secondary | ICD-10-CM | POA: Diagnosis not present

## 2017-02-12 DIAGNOSIS — C4442 Squamous cell carcinoma of skin of scalp and neck: Secondary | ICD-10-CM | POA: Diagnosis not present

## 2017-02-12 DIAGNOSIS — Z85828 Personal history of other malignant neoplasm of skin: Secondary | ICD-10-CM | POA: Diagnosis not present

## 2017-02-22 ENCOUNTER — Ambulatory Visit (INDEPENDENT_AMBULATORY_CARE_PROVIDER_SITE_OTHER): Payer: Medicare Other

## 2017-02-22 ENCOUNTER — Ambulatory Visit (INDEPENDENT_AMBULATORY_CARE_PROVIDER_SITE_OTHER): Payer: Medicare Other | Admitting: Internal Medicine

## 2017-02-22 ENCOUNTER — Encounter: Payer: Self-pay | Admitting: Internal Medicine

## 2017-02-22 VITALS — BP 130/68 | HR 58 | Temp 97.3°F | Ht 67.0 in | Wt 160.0 lb

## 2017-02-22 DIAGNOSIS — Z0000 Encounter for general adult medical examination without abnormal findings: Secondary | ICD-10-CM | POA: Diagnosis not present

## 2017-02-22 DIAGNOSIS — C439 Malignant melanoma of skin, unspecified: Secondary | ICD-10-CM | POA: Diagnosis not present

## 2017-02-22 DIAGNOSIS — E118 Type 2 diabetes mellitus with unspecified complications: Secondary | ICD-10-CM

## 2017-02-22 DIAGNOSIS — N183 Chronic kidney disease, stage 3 unspecified: Secondary | ICD-10-CM

## 2017-02-22 DIAGNOSIS — I5042 Chronic combined systolic (congestive) and diastolic (congestive) heart failure: Secondary | ICD-10-CM | POA: Diagnosis not present

## 2017-02-22 DIAGNOSIS — I10 Essential (primary) hypertension: Secondary | ICD-10-CM

## 2017-02-22 NOTE — Patient Instructions (Addendum)
[name] [name] , Thank you for taking time to come for your Medicare Wellness Visit. I appreciate your ongoing commitment to your health goals. Please review the following plan we discussed and let me know if I can assist you in the future.   Screening recommendations/referrals: Colonoscopy: please call with past date. Recommended yearly ophthalmology/optometry visit for glaucoma screening and checkup Recommended yearly dental visit for hygiene and checkup  Vaccinations: Influenza vaccine due in October 2018 Pneumococcal vaccine up to date Tdap vaccinedue in 2026 Shingles vaccine reported completed  Advanced directives: You have advanced directives in chart  Conditions/risks identified: None  Next appointment:Dr. Mariea Clonts 3/15 @ 1:45. Please make next annual wellness visit for next year. Thanks!  Preventive Care 10 Years and Older, Male Preventive care refers to lifestyle choices and visits with your health care provider that can promote health and wellness. What does preventive care include?  A yearly physical exam. This is also called an annual well check.  Dental exams once or twice a year.  Routine eye exams. Ask your health care provider how often you should have your eyes checked.  Personal lifestyle choices, including:  Daily care of your teeth and gums.  Regular physical activity.  Eating a healthy diet.  Avoiding tobacco and drug use.  Limiting alcohol use.  Practicing safe sex.  Taking low doses of aspirin every day.  Taking vitamin and mineral supplements as recommended by your health care provider. What happens during an annual well check? The services and screenings done by your health care provider during your annual well check will depend on your age, overall health, lifestyle risk factors, and family history of disease. Counseling  Your health care provider may ask you questions about your:  Alcohol use.  Tobacco use.  Drug use.  Emotional  well-being.  Home and relationship well-being.  Sexual activity.  Eating habits.  History of falls.  Memory and ability to understand (cognition).  Work and work Statistician. Screening  You may have the following tests or measurements:  Height, weight, and BMI.  Blood pressure.  Lipid and cholesterol levels. These may be checked every 5 years, or more frequently if you are over 3 years old.  Skin check.  Lung cancer screening. You may have this screening every year starting at age 67 if you have a 30-pack-year history of smoking and currently smoke or have quit within the past 15 years.  Fecal occult blood test (FOBT) of the stool. You may have this test every year starting at age 38.  Flexible sigmoidoscopy or colonoscopy. You may have a sigmoidoscopy every 5 years or a colonoscopy every 10 years starting at age 65.  Prostate cancer screening. Recommendations will vary depending on your family history and other risks.  Hepatitis C blood test.  Hepatitis B blood test.  Sexually transmitted disease (STD) testing.  Diabetes screening. This is done by checking your blood sugar (glucose) after you have not eaten for a while (fasting). You may have this done every 1-3 years.  Abdominal aortic aneurysm (AAA) screening. You may need this if you are a current or former smoker.  Osteoporosis. You may be screened starting at age 54 if you are at high risk. Talk with your health care provider about your test results, treatment options, and if necessary, the need for more tests. Vaccines  Your health care provider may recommend certain vaccines, such as:  Influenza vaccine. This is recommended every year.  Tetanus, diphtheria, and acellular pertussis (Tdap, Td) vaccine.  You may need a Td booster every 10 years.  Zoster vaccine. You may need this after age 64.  Pneumococcal 13-valent conjugate (PCV13) vaccine. One dose is recommended after age 60.  Pneumococcal  polysaccharide (PPSV23) vaccine. One dose is recommended after age 23. Talk to your health care provider about which screenings and vaccines you need and how often you need them. This information is not intended to replace advice given to you by your health care provider. Make sure you discuss any questions you have with your health care provider. Document Released: 12/24/2015 Document Revised: 08/16/2016 Document Reviewed: 09/28/2015 Elsevier Interactive Patient Education  2017 [place] Prevention in the Home Falls can cause injuries. They can happen to people of all ages. There are many things you can do to make your home safe and to help prevent falls. What can I do on the outside of my home?  Regularly fix the edges of walkways and driveways and fix any cracks.  Remove anything that might make you trip as you walk through a door, such as a raised step or threshold.  Trim any bushes or trees on the path to your home.  Use bright outdoor lighting.  Clear any walking paths of anything that might make someone trip, such as rocks or tools.  Regularly check to see if handrails are loose or broken. Make sure that both sides of any steps have handrails.  Any raised decks and porches should have guardrails on the edges.  Have any leaves, snow, or ice cleared regularly.  Use sand or salt on walking paths during winter.  Clean up any spills in your garage right away. This includes oil or grease spills. What can I do in the bathroom?  Use night lights.  Install grab bars by the toilet and in the tub and shower. Do not use towel bars as grab bars.  Use non-skid mats or decals in the tub or shower.  If you need to sit down in the shower, use a plastic, non-slip stool.  Keep the floor dry. Clean up any water that spills on the floor as soon as it happens.  Remove soap buildup in the tub or shower regularly.  Attach bath mats securely with double-sided non-slip rug  tape.  Do not have throw rugs and other things on the floor that can make you trip. What can I do in the bedroom?  Use night lights.  Make sure that you have a light by your bed that is easy to reach.  Do not use any sheets or blankets that are too big for your bed. They should not hang down onto the floor.  Have a firm chair that has side arms. You can use this for support while you get dressed.  Do not have throw rugs and other things on the floor that can make you trip. What can I do in the kitchen?  Clean up any spills right away.  Avoid walking on wet floors.  Keep items that you use a lot in easy-to-reach places.  If you need to reach something above you, use a strong step stool that has a grab bar.  Keep electrical cords out of the way.  Do not use floor polish or wax that makes floors slippery. If you must use wax, use non-skid floor wax.  Do not have throw rugs and other things on the floor that can make you trip. What can I do with my stairs?  Do not leave any  items on the stairs.  Make sure that there are handrails on both sides of the stairs and use them. Fix handrails that are broken or loose. Make sure that handrails are as long as the stairways.  Check any carpeting to make sure that it is firmly attached to the stairs. Fix any carpet that is loose or worn.  Avoid having throw rugs at the top or bottom of the stairs. If you do have throw rugs, attach them to the floor with carpet tape.  Make sure that you have a light switch at the top of the stairs and the bottom of the stairs. If you do not have them, ask someone to add them for you. What else can I do to help prevent falls?  Wear shoes that:  Do not have high heels.  Have rubber bottoms.  Are comfortable and fit you well.  Are closed at the toe. Do not wear sandals.  If you use a stepladder:  Make sure that it is fully opened. Do not climb a closed stepladder.  Make sure that both sides of the  stepladder are locked into place.  Ask someone to hold it for you, if possible.  Clearly mark and make sure that you can see:  Any grab bars or handrails.  First and last steps.  Where the edge of each step is.  Use tools that help you move around (mobility aids) if they are needed. These include:  Canes.  Walkers.  Scooters.  Crutches.  Turn on the lights when you go into a dark area. Replace any light bulbs as soon as they burn out.  Set up your furniture so you have a clear path. Avoid moving your furniture around.  If any of your floors are uneven, fix them.  If there are any pets around you, be aware of where they are.  Review your medicines with your doctor. Some medicines can make you feel dizzy. This can increase your chance of falling. Ask your doctor what other things that you can do to help prevent falls. This information is not intended to replace advice given to you by your health care provider. Make sure you discuss any questions you have with your health care provider. Document Released: 09/23/2009 Document Revised: 05/04/2016 Document Reviewed: 01/01/2015 Elsevier Interactive Patient Education  2017 [employer].

## 2017-02-22 NOTE — Progress Notes (Signed)
Subjective:   [name] [name] is a 83 y.o. male who presents for Medicare Annual/Subsequent preventive examination.  Cardiac Risk Factors include: advanced age (>52men, >16 women);diabetes mellitus;dyslipidemia;family history of premature cardiovascular disease;hypertension;male gender     Objective:    Vitals: BP 130/68 (BP Location: Left Arm, Patient Position: Sitting)   Pulse (!) 58   Temp 97.3 F (36.3 C) (Oral)   Ht 5\' 7"  (1.702 m)   Wt 160 lb (72.6 kg)   SpO2 94%   BMI 25.06 kg/m   Body mass index is 25.06 kg/m.  Tobacco History  Smoking Status  . Never Smoker  Smokeless Tobacco  . Never Used     Counseling given: Not Answered   Past Medical History:  Diagnosis Date  . Anemia   . Anxiety   . Aortic stenosis    a. s/p AVR with 51mm Edwards pericardial valve 02/07/12 - post-op course complicated by pleural effusion requring thoracentesis/leg cellulitis 03/2012.  . Baker's cyst 07/23/12   Incidental finding of LE venous dopplers  . Blood transfusion    NO REACTION TO TRANSFUSION  . CAD (coronary artery disease)    a. s/p NSTEMI 12/2011:  LHC - Ostial left main 20%, ostial LAD 50%, mid 60-70%, ostial D1 40% and mid 40%, D2 70%, ostial circumflex occluded, ostial RCA 80-90%, LVEDP was 42. b.  s/p CABG x 3 at time of AVR (LiMA-LAD, SVG-2nd daigonal, SVG-PDA) 02/07/12 (post-op course noted above).  . Cancer [facility]) '90's   Colon  . Cataract    right eye, hx of  . Cellulitis    a. RLE cellulitis 2 months post-operatively after AVR/CABG - serratia marcessans, tx with I&D/antibiotics  . CHF (congestive heart failure) ([place])   . Chronic kidney disease   . Chronic systolic heart failure ([place])    a. TEE 01/2012: EF 25-30%, diffuse hypokinesis. b. Not on ACEI due to renal insufficiency.;  c. follow up  echo 08/06/12: EF 25%, mod diast dysfxn, AVR ok, mild MR, mod LAE, mild RAE, mild to mod RV systolic dysfunction  . Diabetes mellitus    borderline  . Flash pulmonary edema  ([place])    Post-cath 12/2011, went into acute pulm edema requiring IV lasix and intubation  . GERD (gastroesophageal reflux disease)   . History of colon cancer    s/p colon resection  . HLD (hyperlipidemia)   . HTN (hypertension)    primary, Dr. Hollace Kinnier  . Ischemic cardiomyopathy   . Mitral regurgitation    Moderate by TEE 01/2012  . Myocardial infarction 12/2011  . Pleural effusion    a. post-operatively after AVR/CABG s/p thoracentesis 03/2012 yielding 1L serosanguinous fluid.  . Stroke ([place]) 10/01   RIght Leg weakness   Past Surgical History:  Procedure Laterality Date  . AORTIC VALVE REPLACEMENT  02/07/2012   Procedure: AORTIC VALVE REPLACEMENT (AVR);  Surgeon: Gaye Pollack, MD;  Location: [place];  Service: Open Heart Surgery;  Laterality: N/A;  . CARDIAC CATHETERIZATION     1.3.13  stopped breathing, put on ventilator for 5-6 days  . CATARACT EXTRACTION     rt  . CHEST TUBE INSERTION  07/24/2012   Procedure: INSERTION PLEURAL DRAINAGE CATHETER;  Surgeon: Gaye Pollack, MD;  Location: [place];  Service: Thoracic;  Laterality: Left;  . COLON RESECTION  1996  . COLONOSCOPY    . CORONARY ARTERY BYPASS GRAFT  02/07/2012   Procedure: CORONARY ARTERY BYPASS GRAFTING (CABG);  Surgeon: Gaye Pollack, MD;  Location:  [place] OR;  Service: Open Heart Surgery;  Laterality: N/A;  CABG x three; using right leg greater saphenous vein harvested endoscopically  . ERCP N/A 07/08/2014   Procedure: ENDOSCOPIC RETROGRADE CHOLANGIOPANCREATOGRAPHY (ERCP);  Surgeon: Missy Sabins, MD;  Location: [facility] ENDOSCOPY;  Service: Endoscopy;  Laterality: N/A;  . ESOPHAGEAL DILATION    . ESOPHAGOGASTRODUODENOSCOPY N/A 07/03/2014   Procedure: ESOPHAGOGASTRODUODENOSCOPY (EGD);  Surgeon: Arta Silence, MD;  [facility]COPY;  Service: Endoscopy;  Laterality: N/A;  . ESOPHAGOSCOPY WITH DILITATION N/A 07/07/2014   Procedure: ESOPHAGOSCOPY WITH DILITATION/SAVARY DILATOR;  Surgeon: Rozetta Nunnery, MD;  [facility];  Service: ENT;  Laterality: N/A;  . EYE SURGERY  2009   cataract removed from right eye  . INGUINAL HERNIA REPAIR Right 09/13/2015   Procedure: OPEN RIGHT INGUINAL HERNIA;  Surgeon: Mickeal Skinner, MD;  Location: [place];  Service: General;  Laterality: Right;  . INSERTION OF MESH Right 09/13/2015   Procedure: INSERTION OF MESH;  Surgeon: Mickeal Skinner, MD;  Location: [place];  Service: General;  Laterality: Right;  . LEFT HEART CATHETERIZATION WITH CORONARY ANGIOGRAM N/A 12/13/2011   Procedure: LEFT HEART CATHETERIZATION WITH CORONARY ANGIOGRAM;  Surgeon: Peter M Martinique, MD;  Location: [facility] CATH LAB;  Service: Cardiovascular;  Laterality: N/A;  . REMOVAL OF PLEURAL DRAINAGE CATHETER  09/27/2012   Procedure: REMOVAL OF PLEURAL DRAINAGE CATHETER;  Surgeon: Gaye Pollack, MD;  Location: [place];  Service: Thoracic;  Laterality: Left;  . TALC PLEURODESIS  08/30/2012   Procedure: Pietro Cassis;  Surgeon: Gaye Pollack, MD;  Location: [place];  Service: Thoracic;  Laterality: Left;  INSERTION OF TALC VIA LEFT PLEURX  . TALC PLEURODESIS  09/12/2012   Procedure: Pietro Cassis;  Surgeon: Gaye Pollack, MD;  Location: [place];  Service: Thoracic;  Laterality: Left;  . TONSILLECTOMY     Family History  Problem Relation Age of Onset  . Cancer Mother   . Heart attack Father   . Mental illness Brother   . Kidney failure Brother    History  Sexual Activity  . Sexual activity: Not Currently    Outpatient Encounter Prescriptions as of 02/22/2017  Medication Sig  . AMBULATORY NON FORMULARY MEDICATION Freestyle lite test strips Use to test blood sugar once daily. Dx E11.8  . aspirin EC 325 MG tablet Take 325 mg by mouth daily.   . carvedilol (COREG) 25 MG tablet TAKE 1 TABLET BY MOUTH TWICE DAILY WITH MEALS  . cholecalciferol (VITAMIN D) 1000 UNITS tablet Take 1,000 Units by mouth daily with supper.   Marland Kitchen FREESTYLE LITE test strip USE TO TEST BLOOD SUGARS ONCE DAILY  . furosemide (LASIX) 40  MG tablet Take 2 tablets (80 mg total) by mouth daily.  Marland Kitchen glimepiride (AMARYL) 1 MG tablet TAKE 1 TABLET BY MOUTH DAILY WITH BREAKFAST  . hydrALAZINE (APRESOLINE) 25 MG tablet TAKE 1 TABLET BY MOUTH THREE TIMES DAILY  . isosorbide mononitrate (IMDUR) 60 MG 24 hr tablet TAKE 1 TABLET BY MOUTH EVERY DAY  . Lancets (FREESTYLE) lancets Use one lancet in morning before breakfast to check blood sugar.  Dx E11.8  . Multiple Vitamin (MULTIVITAMIN WITH MINERALS) TABS tablet Take 1 tablet by mouth daily.  . nitroGLYCERIN (NITROSTAT) 0.4 MG SL tablet Place 0.4 mg under the tongue every 5 (five) minutes as needed for chest pain. As needed  . omeprazole (PRILOSEC) 20 MG capsule Take 20 mg by mouth daily.   . tamsulosin (FLOMAX) 0.4 MG CAPS capsule  TAKE 1 CAPSULE BY MOUTH EVERY DAY AFTER DINNER  . Travoprost, BAK Free, (TRAVATAN) 0.004 % SOLN ophthalmic solution Place 1 drop into both eyes daily.   . [DISCONTINUED] clotrimazole (LOTRIMIN) 1 % cream Apply 1 application topically 2 (two) times daily.   No facility-administered encounter medications on file as of 02/22/2017.     Activities of Daily Living In your present state of health, do you have any difficulty performing the following activities: 02/22/2017  Hearing? N  Vision? N  Difficulty concentrating or making decisions? N  Walking or climbing stairs? N  Dressing or bathing? N  Doing errands, shopping? N  Preparing Food and eating ? N  Using the Toilet? N  In the past six months, have you accidently leaked urine? N  Do you have problems with loss of bowel control? N  Managing your Medications? N  Managing your Finances? N  Housekeeping or managing your Housekeeping? N  Some recent data might be hidden    Patient Care Team: Gayland Curry, DO as PCP - General (Geriatric Medicine) Renella Cunas, MD (Inactive) (Cardiology) Gayland Curry, DO as Resident (Geriatric Medicine) Mickeal Skinner, MD as Consulting Physician (General  Surgery) Luberta Mutter, MD as Consulting Physician (Ophthalmology)   Assessment:   Exercise Activities and Dietary recommendations Current Exercise Habits: Home exercise routine, Type of exercise: walking, Time (Minutes): 20, Frequency (Times/Week): 7, Weekly Exercise (Minutes/Week): 140, Intensity: Mild  Goals    . Maintain lifestyle          Starting 02/22/17 I will maintain my lifestyle.       Fall Risk Fall Risk  02/22/2017 09/14/2016 05/15/2016 02/10/2016 12/02/2015  Falls in the past year? No No No No No  Number falls in past yr: - - - - -  Injury with Fall? - - - - -   Depression Screen PHQ 2/9 Scores 02/22/2017 09/14/2016 05/15/2016 02/10/2016  PHQ - 2 Score 0 0 0 0    Cognitive Function MMSE - Mini Mental State Exam 02/22/2017 02/10/2016 06/17/2015  Orientation to time 3 2 4   Orientation to Place 3 5 5   Registration 3 3 3   Attention/ Calculation 5 4 4   Recall 0 0 0  Language- name 2 objects 2 2 2   Language- repeat 1 1 1   Language- follow 3 step command 3 3 3   Language- read & follow direction 1 1 1   Write a sentence 1 1 1   Copy design 1 1 1   Total score 23 23 25         Immunization History  Administered Date(s) Administered  . Influenza Whole 10/02/2013  . Influenza, High Dose Seasonal PF 08/30/2016  . Influenza-Unspecified 09/10/2014, 08/12/2015  . Pneumococcal Conjugate-13 02/10/2016  . Pneumococcal Polysaccharide-23 07/30/2013  . Tdap 08/26/2015  . Zoster 05/29/2014   Screening Tests Health Maintenance  Topic Date Due  . OPHTHALMOLOGY EXAM  03/25/2016  . URINE MICROALBUMIN  02/09/2017  . HEMOGLOBIN A1C  03/15/2017  . FOOT EXAM  05/15/2017  . TETANUS/TDAP  08/25/2025  . INFLUENZA VACCINE  Completed  . PNA vac Low Risk Adult  Completed      Plan:    I have personally reviewed and addressed the Medicare Annual Wellness questionnaire and have noted the following in the patient's chart:  A. Medical and social history B. Use of alcohol, tobacco or illicit  drugs  C. Current medications and supplements D. Functional ability and status E.  Nutritional status F.  Physical activity G. Advance  directives H. List of other physicians I.  Hospitalizations, surgeries, and ER visits in previous 12 months J.  [place] to include hearing, vision, cognitive, depression L. Referrals and appointments - none  In addition, I have reviewed and discussed with patient certain preventive protocols, quality metrics, and best practice recommendations. A written personalized care plan for preventive services as well as general preventive health recommendations were provided to patient.  See attached scanned questionnaire for additional information.   Signed,   Rich Reining, RN Nurse Health Advisor

## 2017-02-22 NOTE — Progress Notes (Addendum)
   I reviewed health advisor's note, was available for consultation and agree with the assessment and plan as written.    [name] L. [name], D.O. [facility] [address], [place] [address] Cell Phone (Mon-Fri 8am-5pm):  [phone] On Call:  [phone] & follow prompts after 5pm & weekends Office Phone:  [phone] Office Fax:  [phone]   Quick Notes   Health Maintenance: Pt due for eye exam. Is already scheduled for it in April     Abnormal Screen: MMSE 27/30. Did not pass clock drawing     Patient Concerns: None     Nurse Concerns: None

## 2017-02-22 NOTE — Progress Notes (Signed)
Location:  [facility] clinic Provider:  Jaloni Davoli L. Mariea Clonts, D.O., C.M.D.  Code Status: DNR Goals of Care:  Advanced Directives 02/22/2017  Does Patient Have a Medical Advance Directive? Yes  Type of Paramedic of [place];Living will  Does patient want to make changes to medical advance directive? No - Patient declined  Copy of [place] in Chart? Yes  Would patient like information on creating a medical advance directive? -  Pre-existing out of facility DNR order (yellow form or pink MOST form) -     Chief Complaint  Patient presents with  . Follow-up    extended visit    HPI: Patient is a 83 y.o. male seen today for medical management of chronic diseases.    CBGs 109-139.  Excellent and consistent. No lows.  Continues his asa, statin, amaryl.  CHF:  Weight steady at 160lbs.  No increase in sob.  About 2 wks to 2 mos ago, he had to take extra lasix and then he was ok again.  Continues on coreg, lasix, hydralazine  Has urinary frequency from this.    BP remains well controlled with current meds with beta blocker, hydralazine, nitrate  His wife has no concerns about him.  He has no new complaints.    No pain outside of that chronic discomfort from having the right leg veins stripped.    Following with derm.  Had a melanoma on his left temple/foreheard that was removed and having several other skin areas treated.    Past Medical History:  Diagnosis Date  . Anemia   . Anxiety   . Aortic stenosis    a. s/p AVR with 49mm Edwards pericardial valve 02/07/12 - post-op course complicated by pleural effusion requring thoracentesis/leg cellulitis 03/2012.  . Baker's cyst 07/23/12   Incidental finding of LE venous dopplers  . Blood transfusion    NO REACTION TO TRANSFUSION  . CAD (coronary artery disease)    a. s/p NSTEMI 12/2011:  LHC - Ostial left main 20%, ostial LAD 50%, mid 60-70%, ostial D1 40% and mid 40%, D2 70%, ostial circumflex occluded,  ostial RCA 80-90%, LVEDP was 42. b.  s/p CABG x 3 at time of AVR (LiMA-LAD, SVG-2nd daigonal, SVG-PDA) 02/07/12 (post-op course noted above).  . Cancer [facility]) '90's   Colon  . Cataract    right eye, hx of  . Cellulitis    a. RLE cellulitis 2 months post-operatively after AVR/CABG - serratia marcessans, tx with I&D/antibiotics  . CHF (congestive heart failure) ([place])   . Chronic kidney disease   . Chronic systolic heart failure ([place])    a. TEE 01/2012: EF 25-30%, diffuse hypokinesis. b. Not on ACEI due to renal insufficiency.;  c. follow up  echo 08/06/12: EF 25%, mod diast dysfxn, AVR ok, mild MR, mod LAE, mild RAE, mild to mod RV systolic dysfunction  . Diabetes mellitus    borderline  . Flash pulmonary edema ([place])    Post-cath 12/2011, went into acute pulm edema requiring IV lasix and intubation  . GERD (gastroesophageal reflux disease)   . History of colon cancer    s/p colon resection  . HLD (hyperlipidemia)   . HTN (hypertension)    primary, Dr. Hollace Kinnier  . Ischemic cardiomyopathy   . Mitral regurgitation    Moderate by TEE 01/2012  . Myocardial infarction 12/2011  . Pleural effusion    a. post-operatively after AVR/CABG s/p thoracentesis 03/2012 yielding 1L serosanguinous fluid.  . Stroke ([place])  10/01   RIght Leg weakness    Past Surgical History:  Procedure Laterality Date  . AORTIC VALVE REPLACEMENT  02/07/2012   Procedure: AORTIC VALVE REPLACEMENT (AVR);  Surgeon: Gaye Pollack, MD;  Location: [place];  Service: Open Heart Surgery;  Laterality: N/A;  . CARDIAC CATHETERIZATION     1.3.13  stopped breathing, put on ventilator for 5-6 days  . CATARACT EXTRACTION     rt  . CHEST TUBE INSERTION  07/24/2012   Procedure: INSERTION PLEURAL DRAINAGE CATHETER;  Surgeon: Gaye Pollack, MD;  Location: [place];  Service: Thoracic;  Laterality: Left;  . COLON RESECTION  1996  . COLONOSCOPY    . CORONARY ARTERY BYPASS GRAFT  02/07/2012   Procedure: CORONARY ARTERY BYPASS GRAFTING (CABG);   Surgeon: Gaye Pollack, MD;  Location: [place];  Service: Open Heart Surgery;  Laterality: N/A;  CABG x three; using right leg greater saphenous vein harvested endoscopically  . ERCP N/A 07/08/2014   Procedure: ENDOSCOPIC RETROGRADE CHOLANGIOPANCREATOGRAPHY (ERCP);  Surgeon: Missy Sabins, MD;  Location[facility] ENDOSCOPY;  Service: Endoscopy;  Laterality: N/A;  . ESOPHAGEAL DILATION    . ESOPHAGOGASTRODUODENOSCOPY N/A 07/03/2014   Procedure: ESOPHAGOGASTRODUODENOSCOPY (EGD);  Surgeon: Arta Silence, MD;  Location: [facility] ENDOSCOPY;  Service: Endoscopy;  Laterality: N/A;  . ESOPHAGOSCOPY WITH DILITATION N/A 07/07/2014   Procedure: ESOPHAGOSCOPY WITH DILITATION/SAVARY DILATOR;  Surgeon: Rozetta Nunnery, MD;  Location: [facility] OR;  Service: ENT;  Laterality: N/A;  . EYE SURGERY  2009   cataract removed from right eye  . INGUINAL HERNIA REPAIR Right 09/13/2015   Procedure: OPEN RIGHT INGUINAL HERNIA;  Surgeon: Mickeal Skinner, MD;  Location: [place];  Service: General;  Laterality: Right;  . INSERTION OF MESH Right 09/13/2015   Procedure: INSERTION OF MESH;  Surgeon: Mickeal Skinner, MD;  Location: [place];  Service: General;  Laterality: Right;  . LEFT HEART CATHETERIZATION WITH CORONARY ANGIOGRAM N/A 12/13/2011   Procedure: LEFT HEART CATHETERIZATION WITH CORONARY ANGIOGRAM;  Surgeon: Peter M Martinique, MD;  Location: [facility] CATH LAB;  Service: Cardiovascular;  Laterality: N/A;  . REMOVAL OF PLEURAL DRAINAGE CATHETER  09/27/2012   Procedure: REMOVAL OF PLEURAL DRAINAGE CATHETER;  Surgeon: Gaye Pollack, MD;  Location: [place];  Service: Thoracic;  Laterality: Left;  . TALC PLEURODESIS  08/30/2012   Procedure: Pietro Cassis;  Surgeon: Gaye Pollack, MD;  Location: [place];  Service: Thoracic;  Laterality: Left;  INSERTION OF TALC VIA LEFT PLEURX  . TALC PLEURODESIS  09/12/2012   Procedure: Pietro Cassis;  Surgeon: Gaye Pollack, MD;  Location: [place];  Service: Thoracic;  Laterality: Left;  . TONSILLECTOMY       Allergies  Allergen Reactions  . Statins Other (See Comments)    Pain/weakness in legs    Allergies as of 02/22/2017      Reactions   Statins Other (See Comments)   Pain/weakness in legs      Medication List       Accurate as of 02/22/17  2:18 PM. Always use your most recent med list.          aspirin EC 325 MG tablet Take 325 mg by mouth daily.   carvedilol 25 MG tablet Commonly known as:  COREG TAKE 1 TABLET BY MOUTH TWICE DAILY WITH MEALS   cholecalciferol 1000 units tablet Commonly known as:  VITAMIN D Take 1,000 Units by mouth daily with supper.   freestyle lancets Use one lancet in morning before  breakfast to check blood sugar.  Dx E11.8   FREESTYLE LITE test strip Generic drug:  glucose blood USE TO TEST BLOOD SUGARS ONCE DAILY   furosemide 40 MG tablet Commonly known as:  LASIX Take 2 tablets (80 mg total) by mouth daily.   glimepiride 1 MG tablet Commonly known as:  AMARYL TAKE 1 TABLET BY MOUTH DAILY WITH BREAKFAST   hydrALAZINE 25 MG tablet Commonly known as:  APRESOLINE TAKE 1 TABLET BY MOUTH THREE TIMES DAILY   isosorbide mononitrate 60 MG 24 hr tablet Commonly known as:  IMDUR TAKE 1 TABLET BY MOUTH EVERY DAY   multivitamin with minerals Tabs tablet Take 1 tablet by mouth daily.   nitroGLYCERIN 0.4 MG SL tablet Commonly known as:  NITROSTAT Place 0.4 mg under the tongue every 5 (five) minutes as needed for chest pain. As needed   omeprazole 20 MG capsule Commonly known as:  PRILOSEC Take 20 mg by mouth daily.   tamsulosin 0.4 MG Caps capsule Commonly known as:  FLOMAX TAKE 1 CAPSULE BY MOUTH EVERY DAY AFTER DINNER   Travoprost (BAK Free) 0.004 % Soln ophthalmic solution Commonly known as:  TRAVATAN Place 1 drop into both eyes daily.       Review of Systems:  Review of Systems  Constitutional: Negative for chills, fever, malaise/fatigue and weight loss.  HENT: Negative for hearing loss.   Respiratory: Positive for  shortness of breath. Negative for cough, sputum production and wheezing.   Cardiovascular: Negative for chest pain, palpitations and leg swelling.  Gastrointestinal: Negative for abdominal pain.  Genitourinary: Positive for frequency. Negative for dysuria, hematuria and urgency.       Urinates hourly  Musculoskeletal: Negative for falls.  Skin:       Had melanoma on left temple/forehead, several other sq cells and concerning areas biopsied including left arm, back, scalp  Neurological: Negative for dizziness, loss of consciousness and weakness.       Chronic mild right leg weakness  Psychiatric/Behavioral: Positive for memory loss. Negative for depression. The patient is not nervous/anxious and does not have insomnia.     Health Maintenance  Topic Date Due  . URINE MICROALBUMIN  02/09/2017  . OPHTHALMOLOGY EXAM  04/06/2017 (Originally 03/25/2016)  . HEMOGLOBIN A1C  03/15/2017  . FOOT EXAM  05/15/2017  . TETANUS/TDAP  08/25/2025  . INFLUENZA VACCINE  Completed  . PNA vac Low Risk Adult  Completed    Physical Exam: Vitals:   02/22/17 1345  BP: 130/68  Pulse: (!) 58  Temp: 97.3 F (36.3 C)  TempSrc: Oral  SpO2: 94%  Weight: 160 lb (72.6 kg)  Height: 5\' 7"  (1.702 m)   Body mass index is 25.06 kg/m. Physical Exam  Constitutional: He is oriented to person, place, and time. No distress.  HENT:  Head: Normocephalic and atraumatic.  Eyes: EOM are normal. Pupils are equal, round, and reactive to light.  glasses  Neck: Neck supple. No JVD present.  Cardiovascular: Normal rate, regular rhythm and intact distal pulses.   Murmur heard. Pulmonary/Chest: Effort normal and breath sounds normal. He has no wheezes. He has no rales.  Abdominal: Soft. Bowel sounds are normal. He exhibits no distension. There is no tenderness.  Musculoskeletal: Normal range of motion. He exhibits no tenderness.  Lymphadenopathy:    He has no cervical adenopathy.  Neurological: He is alert and oriented to  person, place, and time.  Skin: Skin is warm and dry. Capillary refill takes less than 2 seconds.  Scar  from melanoma excision, bandage on back of head, scar on left upper arm from biopsy, small abrasion on nose and recurrent likely squamous area on right side of forehead/anterior scalp  Psychiatric: He has a normal mood and affect.    Labs reviewed: Basic Metabolic Panel:  Recent Labs  05/10/16 1001 05/10/16 1122 09/14/16 1355  NA 146* 144 138  K 4.9 4.8 4.9  CL 102  --  99  CO2 27 31* 29  GLUCOSE 113* 222* 93  BUN 51* 52.8* 44*  CREATININE 2.49* 2.7* 2.45*  CALCIUM 9.9 9.8 9.2   Liver Function Tests:  Recent Labs  05/10/16 1122  AST 15  ALT 17  ALKPHOS 55  BILITOT 0.82  PROT 7.7  ALBUMIN 3.9   No results for input(s): LIPASE, AMYLASE in the last 8760 hours. No results for input(s): AMMONIA in the last 8760 hours. CBC:  Recent Labs  05/10/16 1122  WBC 6.5  NEUTROABS 3.8  HGB 12.8*  HCT 38.9  MCV 99.6*  PLT 124*   Lipid Panel: No results for input(s): CHOL, HDL, LDLCALC, TRIG, CHOLHDL, LDLDIRECT in the last 8760 hours. Lab Results  Component Value Date   HGBA1C 5.6 09/14/2016    Assessment/Plan 1. Chronic combined systolic and diastolic heart failure, NYHA class 2 (HCC) -under good control -cont current regimen w/o changes  2. Essential hypertension -bp well controlled, cont same regimen and monitor  3. Controlled type 2 diabetes mellitus with complication, without long-term current use of insulin (HCC) -hba1c and home cbgs doing great w/o lows so cont same regimen and monitor  4. Chronic kidney disease, stage III (moderate): Cr ~2.2 baseline -this, too, has been stable, avoid nephrotoxic drugs, cont to monitor  5. Melanoma of skin ([place]) -keep f/u with derm as planned and monitor skin  Labs/tests ordered:   Orders Placed This Encounter  Procedures  . DNR (Do Not Resuscitate)    Order Specific Question:   In the event of cardiac or  respiratory ARREST    Answer:   Do not call a "code blue"    Order Specific Question:   In the event of cardiac or respiratory ARREST    Answer:   Do not perform Intubation, CPR, defibrillation or ACLS    Order Specific Question:   In the event of cardiac or respiratory ARREST    Answer:   Use medication by any route, position, wound care, and other measures to relive pain and suffering. May use oxygen, suction and manual treatment of airway obstruction as needed for comfort.   Next appt:  03/13/2017   Benedicta Sultan L. Levina Boyack, D.O. [facility] [address], [place] [address] Cell Phone (Mon-Fri 8am-5pm):  [phone] On Call:  [phone] & follow prompts after 5pm & weekends Office Phone:  [phone] Office Fax:  [phone]

## 2017-02-23 ENCOUNTER — Other Ambulatory Visit: Payer: Self-pay | Admitting: Internal Medicine

## 2017-02-26 DIAGNOSIS — C44311 Basal cell carcinoma of skin of nose: Secondary | ICD-10-CM | POA: Diagnosis not present

## 2017-02-26 DIAGNOSIS — D485 Neoplasm of uncertain behavior of skin: Secondary | ICD-10-CM | POA: Diagnosis not present

## 2017-02-26 DIAGNOSIS — L905 Scar conditions and fibrosis of skin: Secondary | ICD-10-CM | POA: Diagnosis not present

## 2017-02-26 DIAGNOSIS — C44519 Basal cell carcinoma of skin of other part of trunk: Secondary | ICD-10-CM | POA: Diagnosis not present

## 2017-02-26 DIAGNOSIS — L570 Actinic keratosis: Secondary | ICD-10-CM | POA: Diagnosis not present

## 2017-02-26 DIAGNOSIS — Z85828 Personal history of other malignant neoplasm of skin: Secondary | ICD-10-CM | POA: Diagnosis not present

## 2017-03-01 ENCOUNTER — Other Ambulatory Visit: Payer: Self-pay | Admitting: Internal Medicine

## 2017-03-05 ENCOUNTER — Other Ambulatory Visit (HOSPITAL_COMMUNITY): Payer: Self-pay | Admitting: Cardiology

## 2017-03-05 ENCOUNTER — Other Ambulatory Visit (HOSPITAL_COMMUNITY): Payer: Self-pay | Admitting: Internal Medicine

## 2017-03-05 MED ORDER — CARVEDILOL 25 MG PO TABS: 25.0000 mg | ORAL_TABLET | Freq: Two times a day (BID) | ORAL | 0 refills | Status: DC

## 2017-03-13 ENCOUNTER — Other Ambulatory Visit: Payer: Medicare Other

## 2017-03-13 DIAGNOSIS — N183 Chronic kidney disease, stage 3 unspecified: Secondary | ICD-10-CM

## 2017-03-13 DIAGNOSIS — I10 Essential (primary) hypertension: Secondary | ICD-10-CM

## 2017-03-13 DIAGNOSIS — I5042 Chronic combined systolic (congestive) and diastolic (congestive) heart failure: Secondary | ICD-10-CM

## 2017-03-13 DIAGNOSIS — E118 Type 2 diabetes mellitus with unspecified complications: Secondary | ICD-10-CM | POA: Diagnosis not present

## 2017-03-13 LAB — COMPLETE METABOLIC PANEL WITH GFR
ALT: 14 U/L (ref 9–46)
AST: 17 U/L (ref 10–35)
Albumin: 3.9 g/dL (ref 3.6–5.1)
Alkaline Phosphatase: 50 U/L (ref 40–115)
BUN: 45 mg/dL — ABNORMAL HIGH (ref 7–25)
CO2: 29 mmol/L (ref 20–31)
Calcium: 9.4 mg/dL (ref 8.6–10.3)
Chloride: 103 mmol/L (ref 98–110)
Creat: 2.38 mg/dL — ABNORMAL HIGH (ref 0.70–1.11)
GFR, Est African American: 27 mL/min — ABNORMAL LOW (ref >=60)
GFR, Est Non African American: 23 mL/min — ABNORMAL LOW (ref >=60)
Glucose, Bld: 145 mg/dL — ABNORMAL HIGH (ref 65–99)
Potassium: 4.8 mmol/L (ref 3.5–5.3)
Sodium: 142 mmol/L (ref 135–146)
Total Bilirubin: 0.6 mg/dL (ref 0.2–1.2)
Total Protein: 7.2 g/dL (ref 6.1–8.1)

## 2017-03-13 LAB — LIPID PANEL
Cholesterol: 166 mg/dL (ref <200)
HDL: 33 mg/dL — ABNORMAL LOW (ref >40)
LDL Cholesterol: 96 mg/dL (ref <100)
Total CHOL/HDL Ratio: 5 Ratio — ABNORMAL HIGH (ref <5.0)
Triglycerides: 183 mg/dL — ABNORMAL HIGH (ref <150)
VLDL: 37 mg/dL — ABNORMAL HIGH (ref <30)

## 2017-03-14 LAB — HEMOGLOBIN A1C
Hgb A1c MFr Bld: 5.6 % (ref <5.7)
Mean Plasma Glucose: 114 mg/dL

## 2017-03-15 ENCOUNTER — Ambulatory Visit: Payer: Medicare Other | Admitting: Internal Medicine

## 2017-03-15 ENCOUNTER — Encounter: Payer: Self-pay | Admitting: *Deleted

## 2017-03-20 DIAGNOSIS — C44311 Basal cell carcinoma of skin of nose: Secondary | ICD-10-CM | POA: Diagnosis not present

## 2017-03-20 DIAGNOSIS — Z85828 Personal history of other malignant neoplasm of skin: Secondary | ICD-10-CM | POA: Diagnosis not present

## 2017-04-04 DIAGNOSIS — H2512 Age-related nuclear cataract, left eye: Secondary | ICD-10-CM | POA: Diagnosis not present

## 2017-04-04 DIAGNOSIS — H401112 Primary open-angle glaucoma, right eye, moderate stage: Secondary | ICD-10-CM | POA: Diagnosis not present

## 2017-04-05 ENCOUNTER — Other Ambulatory Visit (HOSPITAL_COMMUNITY): Payer: Self-pay | Admitting: Internal Medicine

## 2017-04-12 ENCOUNTER — Encounter (HOSPITAL_COMMUNITY): Payer: Self-pay

## 2017-04-12 ENCOUNTER — Ambulatory Visit (HOSPITAL_COMMUNITY)
Admission: RE | Admit: 2017-04-12 | Discharge: 2017-04-12 | Disposition: A | Discharge status: Home / Self Care | Payer: Medicare Other | Source: Ambulatory Visit | Attending: Internal Medicine | Admitting: Internal Medicine

## 2017-04-12 VITALS — BP 152/64 | HR 61 | Wt 159.2 lb

## 2017-04-12 DIAGNOSIS — Z7982 Long term (current) use of aspirin: Secondary | ICD-10-CM | POA: Insufficient documentation

## 2017-04-12 DIAGNOSIS — Z888 Allergy status to other drugs, medicaments and biological substances status: Secondary | ICD-10-CM | POA: Insufficient documentation

## 2017-04-12 DIAGNOSIS — N183 Chronic kidney disease, stage 3 unspecified: Secondary | ICD-10-CM

## 2017-04-12 DIAGNOSIS — I10 Essential (primary) hypertension: Secondary | ICD-10-CM | POA: Diagnosis not present

## 2017-04-12 DIAGNOSIS — D631 Anemia in chronic kidney disease: Secondary | ICD-10-CM

## 2017-04-12 DIAGNOSIS — E1122 Type 2 diabetes mellitus with diabetic chronic kidney disease: Secondary | ICD-10-CM | POA: Diagnosis not present

## 2017-04-12 DIAGNOSIS — Z9889 Other specified postprocedural states: Secondary | ICD-10-CM | POA: Diagnosis not present

## 2017-04-12 DIAGNOSIS — K219 Gastro-esophageal reflux disease without esophagitis: Secondary | ICD-10-CM | POA: Insufficient documentation

## 2017-04-12 DIAGNOSIS — E785 Hyperlipidemia, unspecified: Secondary | ICD-10-CM | POA: Diagnosis not present

## 2017-04-12 DIAGNOSIS — I5022 Chronic systolic (congestive) heart failure: Secondary | ICD-10-CM | POA: Diagnosis not present

## 2017-04-12 DIAGNOSIS — Z8673 Personal history of transient ischemic attack (TIA), and cerebral infarction without residual deficits: Secondary | ICD-10-CM | POA: Diagnosis not present

## 2017-04-12 DIAGNOSIS — Z7984 Long term (current) use of oral hypoglycemic drugs: Secondary | ICD-10-CM | POA: Diagnosis not present

## 2017-04-12 DIAGNOSIS — Z85038 Personal history of other malignant neoplasm of large intestine: Secondary | ICD-10-CM | POA: Diagnosis not present

## 2017-04-12 DIAGNOSIS — Z79899 Other long term (current) drug therapy: Secondary | ICD-10-CM | POA: Diagnosis not present

## 2017-04-12 DIAGNOSIS — I252 Old myocardial infarction: Secondary | ICD-10-CM | POA: Diagnosis not present

## 2017-04-12 DIAGNOSIS — Z8249 Family history of ischemic heart disease and other diseases of the circulatory system: Secondary | ICD-10-CM | POA: Insufficient documentation

## 2017-04-12 DIAGNOSIS — I350 Nonrheumatic aortic (valve) stenosis: Secondary | ICD-10-CM | POA: Diagnosis not present

## 2017-04-12 DIAGNOSIS — F419 Anxiety disorder, unspecified: Secondary | ICD-10-CM | POA: Diagnosis not present

## 2017-04-12 DIAGNOSIS — Z809 Family history of malignant neoplasm, unspecified: Secondary | ICD-10-CM | POA: Insufficient documentation

## 2017-04-12 DIAGNOSIS — I340 Nonrheumatic mitral (valve) insufficiency: Secondary | ICD-10-CM | POA: Insufficient documentation

## 2017-04-12 DIAGNOSIS — Z9049 Acquired absence of other specified parts of digestive tract: Secondary | ICD-10-CM | POA: Diagnosis not present

## 2017-04-12 DIAGNOSIS — Z818 Family history of other mental and behavioral disorders: Secondary | ICD-10-CM | POA: Insufficient documentation

## 2017-04-12 DIAGNOSIS — I2510 Atherosclerotic heart disease of native coronary artery without angina pectoris: Secondary | ICD-10-CM | POA: Insufficient documentation

## 2017-04-12 DIAGNOSIS — N189 Chronic kidney disease, unspecified: Secondary | ICD-10-CM

## 2017-04-12 DIAGNOSIS — Z953 Presence of xenogenic heart valve: Secondary | ICD-10-CM | POA: Insufficient documentation

## 2017-04-12 DIAGNOSIS — D649 Anemia, unspecified: Secondary | ICD-10-CM | POA: Diagnosis not present

## 2017-04-12 DIAGNOSIS — I130 Hypertensive heart and chronic kidney disease with heart failure and stage 1 through stage 4 chronic kidney disease, or unspecified chronic kidney disease: Secondary | ICD-10-CM | POA: Insufficient documentation

## 2017-04-12 DIAGNOSIS — Z951 Presence of aortocoronary bypass graft: Secondary | ICD-10-CM | POA: Insufficient documentation

## 2017-04-12 DIAGNOSIS — I255 Ischemic cardiomyopathy: Secondary | ICD-10-CM | POA: Diagnosis not present

## 2017-04-12 DIAGNOSIS — I5042 Chronic combined systolic (congestive) and diastolic (congestive) heart failure: Secondary | ICD-10-CM

## 2017-04-12 DIAGNOSIS — Z841 Family history of disorders of kidney and ureter: Secondary | ICD-10-CM | POA: Insufficient documentation

## 2017-04-12 MED ORDER — CARVEDILOL 25 MG PO TABS: 25.0000 mg | ORAL_TABLET | Freq: Two times a day (BID) | ORAL | 11 refills | Status: DC

## 2017-04-12 NOTE — Progress Notes (Signed)
Patient ID: [name] [name], male   DOB: 1926-04-09, 83 y.o.   MRN: [id number]     Advanced Heart Failure Clinic Note   PCP: [name] [name] HF: [name] [name]   HPI: [name] [name] is a 83 y.o. male with a PMHx of CAD, CABG, bioprosthetic AVR, anemia, CKD last creatinine 2.2, Chronic systolic heart failure EF 40-45%. He is not on an ACE or ARB due renal failure.   Admitted to [facility] 4/28 through 04/15/13  with volume overload. Diuresed with IV lasix. He was not discharged on diuretics. Discharge weight 139 pounds.   He had a hernia repair in 10/16 and developed volume overload after.  Lasix was increased but he had progressive dyspnea.  He was readmitted and found to have AKI with anemia and HCAP.  He was treated for PNA and transfused 2 units PRBCs.  He was discharged to short-term rehab and remains there today.   Echo in 10/16 showed EF 40-45%, inferior/inferolateral/inferoseptal akinesis, bioprosthetic AoV with mean gradient 12 mmHg, RV with moderately decreased systolic function, PASP 54 mmHg.   He was admitted in 11/16 with aspiration PNA after inguinal hernia surgery.   He presents today for regular follow up. Last seen in 11/2015. Has been doing well. Weight is stable at home, every day since January has been 149 lbs. BP usually runs low(er) at home, but has had white coat HTN since he was a teen. Uses a cane at times. Mild SOB at time walking around outside, but usually OK on flat ground. Denies lightheadedness or dizziness. He is able to do everything he wants to do.  Watches salt and fluid intake.   Labs: 05/28/13 Creatinine 2.37 Potassium 4.3 7/14 Creatinine 2.2 3/15 HCT 35.9 10/16 K 3.5, creatinine 2.82, HCT 29.6 11/16 K 4.2, creatinine 2.2, HCT 33.3, plts 127 4/18 K 4.8, creatinine 2.38  Past Medical History:  Diagnosis Date  . Anemia   . Anxiety   . Aortic stenosis    a. s/p AVR with 54mm Edwards pericardial valve 02/07/12 - post-op course complicated by pleural effusion requring  thoracentesis/leg cellulitis 03/2012.  . Baker's cyst 07/23/12   Incidental finding of LE venous dopplers  . Blood transfusion    NO REACTION TO TRANSFUSION  . CAD (coronary artery disease)    a. s/p NSTEMI 12/2011:  LHC - Ostial left main 20%, ostial LAD 50%, mid 60-70%, ostial D1 40% and mid 40%, D2 70%, ostial circumflex occluded, ostial RCA 80-90%, LVEDP was 42. b.  s/p CABG x 3 at time of AVR (LiMA-LAD, SVG-2nd daigonal, SVG-PDA) 02/07/12 (post-op course noted above).  . Cancer [facility]) '90's   Colon  . Cataract    right eye, hx of  . Cellulitis    a. RLE cellulitis 2 months post-operatively after AVR/CABG - serratia marcessans, tx with I&D/antibiotics  . CHF (congestive heart failure) ([place])   . Chronic kidney disease   . Chronic systolic heart failure ([place])    a. TEE 01/2012: EF 25-30%, diffuse hypokinesis. b. Not on ACEI due to renal insufficiency.;  c. follow up  echo 08/06/12: EF 25%, mod diast dysfxn, AVR ok, mild [name], mod LAE, mild RAE, mild to mod RV systolic dysfunction  . Diabetes mellitus    borderline  . Flash pulmonary edema ([place])    Post-cath 12/2011, went into acute pulm edema requiring IV lasix and intubation  . GERD (gastroesophageal reflux disease)   . History of colon cancer    s/p colon resection  . HLD (hyperlipidemia)   .  HTN (hypertension)    primary, Dr. Hollace [name]  . Ischemic cardiomyopathy   . Mitral regurgitation    Moderate by TEE 01/2012  . Myocardial infarction ([place]) 12/2011  . Pleural effusion    a. post-operatively after AVR/CABG s/p thoracentesis 03/2012 yielding 1L serosanguinous fluid.  . Stroke ([place]) 10/01   RIght Leg weakness    Current Outpatient Prescriptions  Medication Sig Dispense Refill  . aspirin EC 325 MG tablet Take 325 mg by mouth daily.     . carvedilol (COREG) 25 MG tablet Take 1 tablet (25 mg total) by mouth 2 (two) times daily with a meal. 60 tablet 0  . cholecalciferol (VITAMIN D) 1000 UNITS tablet Take 1,000 Units by mouth daily  with supper.     Marland Kitchen FREESTYLE LITE test strip USE TO TEST BLOOD SUGARS ONCE DAILY 100 each 6  . furosemide (LASIX) 40 MG tablet Take 2 tablets (80 mg total) by mouth daily. 180 tablet 2  . glimepiride (AMARYL) 1 MG tablet TAKE 1 TABLET BY MOUTH DAILY WITH BREAKFAST 90 tablet 1  . hydrALAZINE (APRESOLINE) 25 MG tablet TAKE 1 TABLET BY MOUTH THREE TIMES DAILY 270 tablet 1  . isosorbide mononitrate (IMDUR) 60 MG 24 hr tablet TAKE 1 TABLET BY MOUTH EVERY DAY 90 tablet 3  . Lancets (FREESTYLE) lancets Use one lancet in morning before breakfast to check blood sugar.  Dx E11.8 100 each 11  . Multiple Vitamin (MULTIVITAMIN WITH MINERALS) TABS tablet Take 1 tablet by mouth daily.    . nitroGLYCERIN (NITROSTAT) 0.4 MG SL tablet Place 0.4 mg under the tongue every 5 (five) minutes as needed for chest pain. As needed    . omeprazole (PRILOSEC) 20 MG capsule Take 20 mg by mouth daily.     . tamsulosin (FLOMAX) 0.4 MG CAPS capsule TAKE 1 CAPSULE BY MOUTH EVERY DAY AFTER DINNER 90 capsule 0  . Travoprost, BAK Free, (TRAVATAN) 0.004 % SOLN ophthalmic solution Place 1 drop into both eyes daily.      No current facility-administered medications for this encounter.      Allergies  Allergen Reactions  . Statins Other (See Comments)    Pain/weakness in legs    Social History   Social History  . Marital status: Married    Spouse name: N/A  . Number of children: N/A  . Years of education: N/A   Occupational History  . retired owned [employer]    Social History Main Topics  . Smoking status: Never Smoker  . Smokeless tobacco: Never Used  . Alcohol use No  . Drug use: No  . Sexual activity: Not Currently   Other Topics Concern  . Not on file   Social History Narrative   Married [name] [name]   Never smoked   Alcohol none   Exercise walking, weights   Living Will, MOST form       Family History  Problem Relation Age of Onset  . Cancer Mother   . Heart attack Father   . Mental illness  Brother   . Kidney failure Brother     PHYSICAL EXAM: Vitals:   04/12/17 1359  BP: (!) 152/64  Pulse: 61  SpO2: 98%  Weight: 159 lb 3.2 oz (72.2 kg)   Wt Readings from Last 3 Encounters:  04/12/17 159 lb 3.2 oz (72.2 kg)  02/22/17 160 lb (72.6 kg)  02/22/17 160 lb (72.6 kg)   General: Elderly appearing.  HEENT: normal Neck: Supple. JVD 5-6. Carotids 2+ bilat;  no bruits. No thyromegaly or nodule noted. Cor: PMI nondisplaced. RRR, 1/6 SEM RUSB Lungs: CTAB, normal effort. Abdomen: soft, non-tender, distended, no HSM. No bruits or masses. +BS  Extremities: no cyanosis, clubbing, rash, R and LLE no edema.  Neuro: alert & orientedx3, cranial nerves grossly intact. moves all 4 extremities w/o difficulty. Affect pleasant   ASSESSMENT & PLAN: 1. CAD: S/p CABG.   - No chest pain. - Continue ASA and coreg.  - No statin with myalgias.  2. Aortic valve replacement: Bioprosthetic.   - Stable on last echo in 09/2015. 3. Chronic systolic CHF: Ischemic cardiomyopathy.  EF 40-45% on last echo.  - Volume status looks great. NYHA II-III.  - Continue lasix 80 mg daily - Continue coreg 25 mg BID - Continue hydralazine 25 mg TID - Continue Imdur 60 mg daily.  - Not on ACEI because of CKD.  - No change to meds today. BP usually normal to low.  - Reinforced fluid restriction to < 2 L daily, sodium restriction to less than 2000 mg daily, and the importance of daily weights.   4. CKD III:  - Recent BMET stable.  5. Anemia:  - PCP following closely.   He is doing very well.  Could likely stretch appointments out to yearly vs prn, but will have him see MD in 6 months.  Shirley Friar, PA-C  04/12/2017

## 2017-04-12 NOTE — Patient Instructions (Signed)
We will contact you in 6 months to schedule your next appointment.  

## 2017-04-19 DIAGNOSIS — H2512 Age-related nuclear cataract, left eye: Secondary | ICD-10-CM | POA: Diagnosis not present

## 2017-04-19 DIAGNOSIS — H2181 Floppy iris syndrome: Secondary | ICD-10-CM | POA: Diagnosis not present

## 2017-04-19 DIAGNOSIS — H21562 Pupillary abnormality, left eye: Secondary | ICD-10-CM | POA: Diagnosis not present

## 2017-04-19 DIAGNOSIS — H25812 Combined forms of age-related cataract, left eye: Secondary | ICD-10-CM | POA: Diagnosis not present

## 2017-05-03 ENCOUNTER — Other Ambulatory Visit: Payer: Self-pay | Admitting: Internal Medicine

## 2017-05-10 ENCOUNTER — Other Ambulatory Visit (HOSPITAL_BASED_OUTPATIENT_CLINIC_OR_DEPARTMENT_OTHER): Payer: Medicare Other

## 2017-05-10 ENCOUNTER — Ambulatory Visit (HOSPITAL_BASED_OUTPATIENT_CLINIC_OR_DEPARTMENT_OTHER): Payer: Medicare Other | Admitting: Oncology

## 2017-05-10 VITALS — BP 151/53 | HR 59 | Temp 98.6°F | Resp 18 | Ht 67.0 in | Wt 161.7 lb

## 2017-05-10 DIAGNOSIS — N189 Chronic kidney disease, unspecified: Secondary | ICD-10-CM

## 2017-05-10 DIAGNOSIS — I10 Essential (primary) hypertension: Secondary | ICD-10-CM

## 2017-05-10 DIAGNOSIS — D631 Anemia in chronic kidney disease: Secondary | ICD-10-CM

## 2017-05-10 DIAGNOSIS — D696 Thrombocytopenia, unspecified: Secondary | ICD-10-CM

## 2017-05-10 LAB — CBC WITH DIFFERENTIAL/PLATELET
BASO%: 0.2 % (ref 0.0–2.0)
BASOS ABS: 0 10*3/uL (ref 0.0–0.1)
EOS ABS: 0.1 10*3/uL (ref 0.0–0.5)
EOS%: 1.4 % (ref 0.0–7.0)
HCT: 35.1 % — ABNORMAL LOW (ref 38.4–49.9)
HGB: 11.4 g/dL — ABNORMAL LOW (ref 13.0–17.1)
LYMPH%: 35.9 % (ref 14.0–49.0)
MCH: 32.4 pg (ref 27.2–33.4)
MCHC: 32.5 g/dL (ref 32.0–36.0)
MCV: 99.7 fL — AB (ref 79.3–98.0)
MONO#: 0.5 10*3/uL (ref 0.1–0.9)
MONO%: 7.6 % (ref 0.0–14.0)
NEUT%: 54.9 % (ref 39.0–75.0)
NEUTROS ABS: 3.5 10*3/uL (ref 1.5–6.5)
Platelets: 124 10*3/uL — ABNORMAL LOW (ref 140–400)
RBC: 3.52 10*6/uL — ABNORMAL LOW (ref 4.20–5.82)
RDW: 13.6 % (ref 11.0–14.6)
WBC: 6.3 10*3/uL (ref 4.0–10.3)
lymph#: 2.3 10*3/uL (ref 0.9–3.3)

## 2017-05-10 NOTE — Progress Notes (Signed)
Hematology and Oncology Follow Up Visit  [name] [name] [id number] October 15, 1926 83 y.o. 05/10/2017 1:36 PM [name], [name] [name], DOReed, [name] L, DO   Principle Diagnosis: 83 year old gentleman with anemia that is multifactorial likely due to chronic disease and renal insufficiency  Current therapy: Observation and surveillance.   Interim History:  [name] [name] returns for routine followup with his wife. Since the last visit, he reports no complaints. He remains reasonably active and continues to drive and attends to activities of daily living. He denied any hematochezia or melena. He denied any excessive fatigue or tiredness. He reports no respiratory symptoms or constitutional symptoms. He does have some residual weakness on the left side because of a previous stroke.   Does not report any headaches, blurry vision, syncope or seizures. Has not reported any bleeding complications. He denies chest pain, shortness of breath, dyspnea. He has not noticed any bleeding. Does not report any cough or hemoptysis or wheezing. Does not report any nausea, vomiting or abdominal pain. The remaining review of systems unremarkable.  Medications: I have reviewed the patient's current medications.  Current Outpatient Prescriptions  Medication Sig Dispense Refill  . aspirin EC 325 MG tablet Take 325 mg by mouth daily.     . carvedilol (COREG) 25 MG tablet Take 1 tablet (25 mg total) by mouth 2 (two) times daily with a meal. 60 tablet 11  . cholecalciferol (VITAMIN D) 1000 UNITS tablet Take 1,000 Units by mouth daily with supper.     Marland Kitchen FREESTYLE LITE test strip USE TO TEST BLOOD SUGARS ONCE DAILY 100 each 6  . furosemide (LASIX) 40 MG tablet Take 2 tablets (80 mg total) by mouth daily. 180 tablet 2  . glimepiride (AMARYL) 1 MG tablet TAKE 1 TABLET BY MOUTH DAILY WITH BREAKFAST 90 tablet 1  . hydrALAZINE (APRESOLINE) 25 MG tablet TAKE 1 TABLET BY MOUTH THREE TIMES DAILY 270 tablet 1  . isosorbide mononitrate  (IMDUR) 60 MG 24 hr tablet TAKE 1 TABLET BY MOUTH EVERY DAY 90 tablet 3  . Lancets (FREESTYLE) lancets Use one lancet in morning before breakfast to check blood sugar.  Dx E11.8 100 each 11  . Multiple Vitamin (MULTIVITAMIN WITH MINERALS) TABS tablet Take 1 tablet by mouth daily.    . nitroGLYCERIN (NITROSTAT) 0.4 MG SL tablet Place 0.4 mg under the tongue every 5 (five) minutes as needed for chest pain. As needed    . omeprazole (PRILOSEC) 20 MG capsule Take 20 mg by mouth daily.     . tamsulosin (FLOMAX) 0.4 MG CAPS capsule TAKE 1 CAPSULE BY MOUTH EVERY DAY AFTER DINNER 90 capsule 0  . Travoprost, BAK Free, (TRAVATAN) 0.004 % SOLN ophthalmic solution Place 1 drop into both eyes daily.      No current facility-administered medications for this visit.     Allergies:  Allergies  Allergen Reactions  . Statins Other (See Comments)    Pain/weakness in legs    Past Medical History, Surgical history, Social history, and Family History were reviewed and updated.    Physical Exam: Blood pressure (!) 151/53, pulse (!) 59, temperature 98.6 F (37 C), temperature source Oral, resp. rate 18, height 5\' 7"  (1.702 m), weight 161 lb 11.2 oz (73.3 kg), SpO2 97 %. ECOG: 1 General appearance: Alert, awake gentleman without distress. Head: Normocephalic, without obvious abnormality no oral thrush or ulcers. Neck: no adenopathy Lymph nodes: Cervical, supraclavicular, and axillary nodes normal. Heart:regular rate and rhythm, S1, S2 normal, no murmur, click, rub or gallop Lung:chest  clear, expiratory wheezes noted bilaterally.  Abdomen: soft, non-tender, without masses or organomegaly no rebound or guarding. EXT:no erythema, induration, or nodules   Lab Results: Lab Results  Component Value Date   WBC 6.3 05/10/2017   HGB 11.4 ([name]) 05/10/2017   HCT 35.1 ([name]) 05/10/2017   MCV 99.7 (H) 05/10/2017   PLT 124 ([name]) 05/10/2017     Chemistry      Component Value Date/Time   NA 142 03/13/2017 1000   NA  144 05/10/2016 1122   K 4.8 03/13/2017 1000   K 4.8 05/10/2016 1122   CL 103 03/13/2017 1000   CL 101 09/04/2012 1309   CO2 29 03/13/2017 1000   CO2 31 (H) 05/10/2016 1122   BUN 45 (H) 03/13/2017 1000   BUN 52.8 (H) 05/10/2016 1122   CREATININE 2.38 (H) 03/13/2017 1000   CREATININE 2.7 (H) 05/10/2016 1122      Component Value Date/Time   CALCIUM 9.4 03/13/2017 1000   CALCIUM 9.8 05/10/2016 1122   ALKPHOS 50 03/13/2017 1000   ALKPHOS 55 05/10/2016 1122   AST 17 03/13/2017 1000   AST 15 05/10/2016 1122   ALT 14 03/13/2017 1000   ALT 17 05/10/2016 1122   BILITOT 0.6 03/13/2017 1000   BILITOT 0.82 05/10/2016 1122       Impression and Plan:  This is an 83 year old gentleman with the following issues:  1. Anemia due to chronic disease and renal insufficiency. He has received Aranesp in the past but no longer needed at this point.  His hemoglobin today 11.4 and he is asymptomatic. His hemoglobin appears to be close to normal range and does not require any intervention. I recommended observation and surveillance for the time being. Growth factor support can be introduced in the future if needed to.  2. Thrombocytopenia: There are mild and asymptomatic.  3. Hypertension. Blood pressure is controlled with hydralazine, Coreg, and Lasix.  4. Followup. Will be as needed. His hemoglobin have been stable for last few years and I'm happy to reevaluate him in the future if his hemoglobin drops below 10.     Zola Button, MD 5/31/20181:36 PM

## 2017-05-21 DIAGNOSIS — D485 Neoplasm of uncertain behavior of skin: Secondary | ICD-10-CM | POA: Diagnosis not present

## 2017-05-21 DIAGNOSIS — L570 Actinic keratosis: Secondary | ICD-10-CM | POA: Diagnosis not present

## 2017-05-21 DIAGNOSIS — L821 Other seborrheic keratosis: Secondary | ICD-10-CM | POA: Diagnosis not present

## 2017-05-21 DIAGNOSIS — Z85820 Personal history of malignant melanoma of skin: Secondary | ICD-10-CM | POA: Diagnosis not present

## 2017-05-21 DIAGNOSIS — L929 Granulomatous disorder of the skin and subcutaneous tissue, unspecified: Secondary | ICD-10-CM | POA: Diagnosis not present

## 2017-05-21 DIAGNOSIS — C44529 Squamous cell carcinoma of skin of other part of trunk: Secondary | ICD-10-CM | POA: Diagnosis not present

## 2017-05-21 DIAGNOSIS — Z85828 Personal history of other malignant neoplasm of skin: Secondary | ICD-10-CM | POA: Diagnosis not present

## 2017-05-21 DIAGNOSIS — C44622 Squamous cell carcinoma of skin of right upper limb, including shoulder: Secondary | ICD-10-CM | POA: Diagnosis not present

## 2017-05-23 ENCOUNTER — Other Ambulatory Visit: Payer: Self-pay | Admitting: Internal Medicine

## 2017-05-24 ENCOUNTER — Encounter: Payer: Self-pay | Admitting: Internal Medicine

## 2017-05-24 ENCOUNTER — Ambulatory Visit (INDEPENDENT_AMBULATORY_CARE_PROVIDER_SITE_OTHER): Payer: Medicare Other | Admitting: Internal Medicine

## 2017-05-24 VITALS — BP 148/62 | HR 57 | Temp 98.5°F | Wt 160.0 lb

## 2017-05-24 DIAGNOSIS — N183 Chronic kidney disease, stage 3 unspecified: Secondary | ICD-10-CM

## 2017-05-24 DIAGNOSIS — E118 Type 2 diabetes mellitus with unspecified complications: Secondary | ICD-10-CM

## 2017-05-24 DIAGNOSIS — N189 Chronic kidney disease, unspecified: Secondary | ICD-10-CM

## 2017-05-24 DIAGNOSIS — I10 Essential (primary) hypertension: Secondary | ICD-10-CM | POA: Diagnosis not present

## 2017-05-24 DIAGNOSIS — D631 Anemia in chronic kidney disease: Secondary | ICD-10-CM

## 2017-05-24 DIAGNOSIS — E663 Overweight: Secondary | ICD-10-CM

## 2017-05-24 DIAGNOSIS — I5042 Chronic combined systolic (congestive) and diastolic (congestive) heart failure: Secondary | ICD-10-CM

## 2017-05-24 DIAGNOSIS — H00011 Hordeolum externum right upper eyelid: Secondary | ICD-10-CM | POA: Diagnosis not present

## 2017-05-24 NOTE — Progress Notes (Signed)
Location:  [facility] clinic Provider:  Debbora Ang L. Mariea Clonts, D.O., C.M.D.  Code Status: DNR Goals of Care:  Advanced Directives 05/24/2017  Does Patient Have a Medical Advance Directive? Yes  Type of Advance Directive Living will;Out of facility DNR (pink MOST or yellow form)  Does patient want to make changes to medical advance directive? -  Copy of [place] in Chart? -  Would patient like information on creating a medical advance directive? -  Pre-existing out of facility DNR order (yellow form or pink MOST form) Pink MOST form placed in chart (order not valid for inpatient use)     Chief Complaint  Patient presents with  . Medical Management of Chronic Issues    3MTH FOLLOW-UP    HPI: Patient is a 83 y.o. male seen today for medical management of chronic diseases.    Vision is much better after his cataract surgery.  Did have an eye infection on erythromycin.    HTN and DMII:  P and CBGs still normal.  113-130 sugars.  One 112, but no lower (had early supper the night before).  No actual lows.  Due for urine microalbumin and foot exam.  Feeling well.  No chest pain or sob.  No increase in edema (some chronic in right leg).    Has seen derm and had several skin cancers excised.  I have seen reports from derm and sent for scanning, but now I don't see them in media.     Past Medical History:  Diagnosis Date  . Anemia   . Anxiety   . Aortic stenosis    a. s/p AVR with 81mm Edwards pericardial valve 02/07/12 - post-op course complicated by pleural effusion requring thoracentesis/leg cellulitis 03/2012.  . Baker's cyst 07/23/12   Incidental finding of LE venous dopplers  . Blood transfusion    NO REACTION TO TRANSFUSION  . CAD (coronary artery disease)    a. s/p NSTEMI 12/2011:  LHC - Ostial left main 20%, ostial LAD 50%, mid 60-70%, ostial D1 40% and mid 40%, D2 70%, ostial circumflex occluded, ostial RCA 80-90%, LVEDP was 42. b.  s/p CABG x 3 at time of AVR  (LiMA-LAD, SVG-2nd daigonal, SVG-PDA) 02/07/12 (post-op course noted above).  . Cancer [facility]) '90's   Colon  . Cataract    right eye, hx of  . Cellulitis    a. RLE cellulitis 2 months post-operatively after AVR/CABG - serratia marcessans, tx with I&D/antibiotics  . CHF (congestive heart failure) ([place])   . Chronic kidney disease   . Chronic systolic heart failure ([place])    a. TEE 01/2012: EF 25-30%, diffuse hypokinesis. b. Not on ACEI due to renal insufficiency.;  c. follow up  echo 08/06/12: EF 25%, mod diast dysfxn, AVR ok, mild MR, mod LAE, mild RAE, mild to mod RV systolic dysfunction  . Diabetes mellitus    borderline  . Flash pulmonary edema ([place])    Post-cath 12/2011, went into acute pulm edema requiring IV lasix and intubation  . GERD (gastroesophageal reflux disease)   . History of colon cancer    s/p colon resection  . HLD (hyperlipidemia)   . HTN (hypertension)    primary, Dr. Hollace Kinnier  . Ischemic cardiomyopathy   . Mitral regurgitation    Moderate by TEE 01/2012  . Myocardial infarction ([place]) 12/2011  . Pleural effusion    a. post-operatively after AVR/CABG s/p thoracentesis 03/2012 yielding 1L serosanguinous fluid.  . Stroke ([place]) 10/01   RIght  Leg weakness    Past Surgical History:  Procedure Laterality Date  . AORTIC VALVE REPLACEMENT  02/07/2012   Procedure: AORTIC VALVE REPLACEMENT (AVR);  Surgeon: Gaye Pollack, MD;  Location: [place];  Service: Open Heart Surgery;  Laterality: N/A;  . CARDIAC CATHETERIZATION     1.3.13  stopped breathing, put on ventilator for 5-6 days  . CATARACT EXTRACTION     rt  . CHEST TUBE INSERTION  07/24/2012   Procedure: INSERTION PLEURAL DRAINAGE CATHETER;  Surgeon: Gaye Pollack, MD;  Location: [place];  Service: Thoracic;  Laterality: Left;  . COLON RESECTION  1996  . COLONOSCOPY    . CORONARY ARTERY BYPASS GRAFT  02/07/2012   Procedure: CORONARY ARTERY BYPASS GRAFTING (CABG);  Surgeon: Gaye Pollack, MD;  Location: [place];  Service:  Open Heart Surgery;  Laterality: N/A;  CABG x three; using right leg greater saphenous vein harvested endoscopically  . ERCP N/A 07/08/2014   Procedure: ENDOSCOPIC RETROGRADE CHOLANGIOPANCREATOGRAPHY (ERCP);  Surgeon: Missy Sabins, MD;  Location: [facility] ENDOSCOPY;  Service: Endoscopy;  Laterality: N/A;  . ESOPHAGEAL DILATION    . ESOPHAGOGASTRODUODENOSCOPY N/A 07/03/2014   Procedure: ESOPHAGOGASTRODUODENOSCOPY (EGD);  Surgeon: Arta Silence, MD;  Location: [name] ENDOSCOPY;  Service: Endoscopy;  Laterality: N/A;  . ESOPHAGOSCOPY WITH DILITATION N/A 07/07/2014   Procedure: ESOPHAGOSCOPY WITH DILITATION/SAVARY DILATOR;  Surgeon: Rozetta Nunnery, MD;  Location: [facility] OR;  Service: ENT;  Laterality: N/A;  . EYE SURGERY  2009   cataract removed from right eye  . INGUINAL HERNIA REPAIR Right 09/13/2015   Procedure: OPEN RIGHT INGUINAL HERNIA;  Surgeon: Mickeal Skinner, MD;  Location: [place];  Service: General;  Laterality: Right;  . INSERTION OF MESH Right 09/13/2015   Procedure: INSERTION OF MESH;  Surgeon: Mickeal Skinner, MD;  Location: [place];  Service: General;  Laterality: Right;  . LEFT HEART CATHETERIZATION WITH CORONARY ANGIOGRAM N/A 12/13/2011   Procedure: LEFT HEART CATHETERIZATION WITH CORONARY ANGIOGRAM;  Surgeon: Peter M Martinique, MD;  Location: [facility] CATH LAB;  Service: Cardiovascular;  Laterality: N/A;  . REMOVAL OF PLEURAL DRAINAGE CATHETER  09/27/2012   Procedure: REMOVAL OF PLEURAL DRAINAGE CATHETER;  Surgeon: Gaye Pollack, MD;  Location: [place];  Service: Thoracic;  Laterality: Left;  . TALC PLEURODESIS  08/30/2012   Procedure: Pietro Cassis;  Surgeon: Gaye Pollack, MD;  Location: [place];  Service: Thoracic;  Laterality: Left;  INSERTION OF TALC VIA LEFT PLEURX  . TALC PLEURODESIS  09/12/2012   Procedure: Pietro Cassis;  Surgeon: Gaye Pollack, MD;  Location: [place];  Service: Thoracic;  Laterality: Left;  . TONSILLECTOMY      Allergies  Allergen Reactions  . Statins Other (See  Comments)    Pain/weakness in legs    Allergies as of 05/24/2017      Reactions   Statins Other (See Comments)   Pain/weakness in legs      Medication List       Accurate as of 05/24/17  3:05 PM. Always use your most recent med list.          aspirin EC 325 MG tablet Take 325 mg by mouth daily.   carvedilol 25 MG tablet Commonly known as:  COREG Take 1 tablet (25 mg total) by mouth 2 (two) times daily with a meal.   cholecalciferol 1000 units tablet Commonly known as:  VITAMIN D Take 1,000 Units by mouth daily with supper.   erythromycin ophthalmic ointment Place 1 application  into the right eye 2 (two) times daily.   freestyle lancets Use one lancet in morning before breakfast to check blood sugar.  Dx E11.8   FREESTYLE LITE test strip Generic drug:  glucose blood USE TO TEST BLOOD SUGARS ONCE DAILY   furosemide 40 MG tablet Commonly known as:  LASIX Take 2 tablets (80 mg total) by mouth daily.   glimepiride 1 MG tablet Commonly known as:  AMARYL TAKE 1 TABLET BY MOUTH DAILY WITH BREAKFAST   hydrALAZINE 25 MG tablet Commonly known as:  APRESOLINE TAKE 1 TABLET BY MOUTH THREE TIMES DAILY   isosorbide mononitrate 60 MG 24 hr tablet Commonly known as:  IMDUR TAKE 1 TABLET BY MOUTH EVERY DAY   multivitamin with minerals Tabs tablet Take 1 tablet by mouth daily.   nitroGLYCERIN 0.4 MG SL tablet Commonly known as:  NITROSTAT Place 0.4 mg under the tongue every 5 (five) minutes as needed for chest pain. As needed   omeprazole 20 MG capsule Commonly known as:  PRILOSEC Take 20 mg by mouth daily.   tamsulosin 0.4 MG Caps capsule Commonly known as:  FLOMAX TAKE 1 CAPSULE BY MOUTH EVERY DAY AFTER DINNER   Travoprost (BAK Free) 0.004 % Soln ophthalmic solution Commonly known as:  TRAVATAN Place 1 drop into both eyes daily.       Review of Systems:  Review of Systems  Constitutional: Negative for chills and fever.  HENT: Positive for hearing loss.  Negative for congestion.   Eyes: Negative for blurred vision.       Glasses  Respiratory: Negative for cough, shortness of breath and wheezing.   Cardiovascular: Positive for leg swelling. Negative for chest pain and palpitations.       Chronic right leg, but no changes  Gastrointestinal: Negative for abdominal pain.  Genitourinary: Positive for frequency. Negative for dysuria.       On diuretic, ok other times  Musculoskeletal: Negative for falls.  Skin: Negative for itching and rash.  Neurological: Negative for dizziness and loss of consciousness.  Endo/Heme/Allergies: Bruises/bleeds easily.  Psychiatric/Behavioral: Positive for memory loss. Negative for depression. The patient is not nervous/anxious and does not have insomnia.     Health Maintenance  Topic Date Due  . OPHTHALMOLOGY EXAM  03/25/2016  . URINE MICROALBUMIN  02/09/2017  . FOOT EXAM  05/15/2017  . INFLUENZA VACCINE  07/11/2017  . HEMOGLOBIN A1C  09/12/2017  . TETANUS/TDAP  08/25/2025  . PNA vac Low Risk Adult  Completed    Physical Exam: Vitals:   05/24/17 1454  BP: (!) 148/62  Pulse: (!) 57  Temp: 98.5 F (36.9 C)  TempSrc: Oral  SpO2: 97%  Weight: 160 lb (72.6 kg)   Body mass index is 25.06 kg/m. Physical Exam  Constitutional: He is oriented to person, place, and time. He appears well-developed. No distress.  Eyes:  Right upper lid with raised erythematous area, tender to touch, but improving per his wife  Cardiovascular: Normal rate, regular rhythm and intact distal pulses.   Murmur heard. Pulmonary/Chest: Effort normal and breath sounds normal. No respiratory distress.  Sounds like he is sob, but denies (chronic)  Abdominal: Bowel sounds are normal.  Musculoskeletal: Normal range of motion.  Neurological: He is alert and oriented to person, place, and time.  Some short term memory loss, wife helps with history  Skin: Skin is warm and dry.    Labs reviewed: Basic Metabolic Panel:  Recent  Labs  09/14/16 1355 03/13/17 1000  NA 138  142  K 4.9 4.8  CL 99 103  CO2 29 29  GLUCOSE 93 145*  BUN 44* 45*  CREATININE 2.45* 2.38*  CALCIUM 9.2 9.4   Liver Function Tests:  Recent Labs  03/13/17 1000  AST 17  ALT 14  ALKPHOS 50  BILITOT 0.6  PROT 7.2  ALBUMIN 3.9   No results for input(s): LIPASE, AMYLASE in the last 8760 hours. No results for input(s): AMMONIA in the last 8760 hours. CBC:  Recent Labs  05/10/17 1300  WBC 6.3  NEUTROABS 3.5  HGB 11.4*  HCT 35.1*  MCV 99.7*  PLT 124*   Lipid Panel:  Recent Labs  03/13/17 1000  CHOL 166  HDL 33*  LDLCALC 96  TRIG 183*  CHOLHDL 5.0*   Lab Results  Component Value Date   HGBA1C 5.6 03/13/2017   Assessment/Plan 1. Controlled type 2 diabetes mellitus with complication, without long-term current use of insulin (HCC) -well controlled with hba1c in nl range -cont same diet and oral meds - Microalbumin / creatinine urine ratio  2. Chronic kidney disease, stage III (moderate): Cr ~2.2 baseline -renal function has been stable -Avoid nephrotoxic agents like nsaids, dose adjust renally excreted meds, hydrate. - Microalbumin / creatinine urine ratio  3. Chronic combined systolic and diastolic heart failure, NYHA class 2 (HCC) -stable also on current regimen, no changes needed  4. Essential hypertension -bp well controlled on record provided and at appts, no changes needed  5. Anemia due to chronic kidney disease -h/h has been stable, as well, cont to monitor  6. Hordeolum externum of right upper eyelid -cont warm compresses and ointment per ophtho, keep f/u with them   7. Overweight with body mass index (BMI) 25.0-29.9 -barely, pt does very well with diet and exercise regimen--encouraged him to continue this, goal BMI less than 25 and he's at 25.06!!!   Labs/tests ordered:   Orders Placed This Encounter  Procedures  . Microalbumin / creatinine urine ratio   Next appt:  4 mos med mgt, same day  labs  Rhianna Raulerson L. Tye Vigo, D.O. [facility] [address], [place] [address] Cell Phone (Mon-Fri 8am-5pm):  [phone] On Call:  [phone] & follow prompts after 5pm & weekends Office Phone:  [phone] Office Fax:  [phone]

## 2017-05-25 LAB — MICROALBUMIN / CREATININE URINE RATIO
Creatinine, Urine: 60 mg/dL (ref 20–370)
Microalb Creat Ratio: 13 mcg/mg creat (ref <30)
Microalb, Ur: 0.8 mg/dL

## 2017-06-05 ENCOUNTER — Other Ambulatory Visit: Payer: Self-pay | Admitting: Internal Medicine

## 2017-06-16 ENCOUNTER — Other Ambulatory Visit: Payer: Self-pay | Admitting: Internal Medicine

## 2017-06-25 ENCOUNTER — Encounter (HOSPITAL_COMMUNITY): Payer: Self-pay | Admitting: *Deleted

## 2017-06-25 ENCOUNTER — Observation Stay (HOSPITAL_COMMUNITY)
Admission: EM | Admit: 2017-06-25 | Discharge: 2017-06-26 | Disposition: A | Discharge status: Home / Self Care | Payer: Medicare Other | Attending: Cardiology | Admitting: Cardiology

## 2017-06-25 ENCOUNTER — Emergency Department (HOSPITAL_COMMUNITY): Payer: Medicare Other

## 2017-06-25 DIAGNOSIS — I110 Hypertensive heart disease with heart failure: Secondary | ICD-10-CM | POA: Diagnosis not present

## 2017-06-25 DIAGNOSIS — R079 Chest pain, unspecified: Secondary | ICD-10-CM

## 2017-06-25 DIAGNOSIS — N184 Chronic kidney disease, stage 4 (severe): Secondary | ICD-10-CM | POA: Diagnosis not present

## 2017-06-25 DIAGNOSIS — K5730 Diverticulosis of large intestine without perforation or abscess without bleeding: Secondary | ICD-10-CM | POA: Diagnosis not present

## 2017-06-25 DIAGNOSIS — E8770 Fluid overload, unspecified: Secondary | ICD-10-CM | POA: Insufficient documentation

## 2017-06-25 DIAGNOSIS — I5023 Acute on chronic systolic (congestive) heart failure: Secondary | ICD-10-CM | POA: Diagnosis not present

## 2017-06-25 DIAGNOSIS — I130 Hypertensive heart and chronic kidney disease with heart failure and stage 1 through stage 4 chronic kidney disease, or unspecified chronic kidney disease: Secondary | ICD-10-CM | POA: Insufficient documentation

## 2017-06-25 DIAGNOSIS — E785 Hyperlipidemia, unspecified: Secondary | ICD-10-CM | POA: Insufficient documentation

## 2017-06-25 DIAGNOSIS — Z9049 Acquired absence of other specified parts of digestive tract: Secondary | ICD-10-CM | POA: Insufficient documentation

## 2017-06-25 DIAGNOSIS — Z7984 Long term (current) use of oral hypoglycemic drugs: Secondary | ICD-10-CM | POA: Insufficient documentation

## 2017-06-25 DIAGNOSIS — I959 Hypotension, unspecified: Secondary | ICD-10-CM | POA: Diagnosis not present

## 2017-06-25 DIAGNOSIS — Z953 Presence of xenogenic heart valve: Secondary | ICD-10-CM | POA: Insufficient documentation

## 2017-06-25 DIAGNOSIS — E1122 Type 2 diabetes mellitus with diabetic chronic kidney disease: Secondary | ICD-10-CM | POA: Insufficient documentation

## 2017-06-25 DIAGNOSIS — I700 Atherosclerosis of aorta: Secondary | ICD-10-CM | POA: Insufficient documentation

## 2017-06-25 DIAGNOSIS — R001 Bradycardia, unspecified: Secondary | ICD-10-CM | POA: Insufficient documentation

## 2017-06-25 DIAGNOSIS — R911 Solitary pulmonary nodule: Secondary | ICD-10-CM | POA: Diagnosis not present

## 2017-06-25 DIAGNOSIS — I2510 Atherosclerotic heart disease of native coronary artery without angina pectoris: Secondary | ICD-10-CM | POA: Diagnosis not present

## 2017-06-25 DIAGNOSIS — I255 Ischemic cardiomyopathy: Secondary | ICD-10-CM | POA: Diagnosis not present

## 2017-06-25 DIAGNOSIS — R0789 Other chest pain: Principal | ICD-10-CM | POA: Insufficient documentation

## 2017-06-25 DIAGNOSIS — I69341 Monoplegia of lower limb following cerebral infarction affecting right dominant side: Secondary | ICD-10-CM | POA: Insufficient documentation

## 2017-06-25 DIAGNOSIS — Z7982 Long term (current) use of aspirin: Secondary | ICD-10-CM | POA: Diagnosis not present

## 2017-06-25 DIAGNOSIS — K5720 Diverticulitis of large intestine with perforation and abscess without bleeding: Secondary | ICD-10-CM | POA: Diagnosis not present

## 2017-06-25 DIAGNOSIS — I252 Old myocardial infarction: Secondary | ICD-10-CM | POA: Diagnosis not present

## 2017-06-25 DIAGNOSIS — D631 Anemia in chronic kidney disease: Secondary | ICD-10-CM | POA: Diagnosis not present

## 2017-06-25 DIAGNOSIS — F419 Anxiety disorder, unspecified: Secondary | ICD-10-CM | POA: Diagnosis not present

## 2017-06-25 DIAGNOSIS — K219 Gastro-esophageal reflux disease without esophagitis: Secondary | ICD-10-CM | POA: Insufficient documentation

## 2017-06-25 DIAGNOSIS — R0602 Shortness of breath: Secondary | ICD-10-CM

## 2017-06-25 LAB — BASIC METABOLIC PANEL
Anion gap: 11 (ref 5–15)
BUN: 41 mg/dL — AB (ref 6–20)
CALCIUM: 9.1 mg/dL (ref 8.9–10.3)
CO2: 25 mmol/L (ref 22–32)
CREATININE: 2.46 mg/dL — AB (ref 0.61–1.24)
Chloride: 103 mmol/L (ref 101–111)
GFR calc Af Amer: 25 mL/min — ABNORMAL LOW (ref >60)
GFR, EST NON AFRICAN AMERICAN: 22 mL/min — AB (ref >60)
Glucose, Bld: 266 mg/dL — ABNORMAL HIGH (ref 65–99)
POTASSIUM: 4.7 mmol/L (ref 3.5–5.1)
SODIUM: 139 mmol/L (ref 135–145)

## 2017-06-25 LAB — BRAIN NATRIURETIC PEPTIDE: B Natriuretic Peptide: 491 pg/mL — ABNORMAL HIGH (ref 0.0–100.0)

## 2017-06-25 LAB — PROTIME-INR
INR: 1.06
Prothrombin Time: 13.8 seconds (ref 11.4–15.2)

## 2017-06-25 LAB — LIPASE, BLOOD: Lipase: 25 U/L (ref 11–51)

## 2017-06-25 LAB — I-STAT VENOUS BLOOD GAS, ED
ACID-BASE EXCESS: 6 mmol/L — AB (ref 0.0–2.0)
Bicarbonate: 29.5 mmol/L — ABNORMAL HIGH (ref 20.0–28.0)
O2 SAT: 87 %
PCO2 VEN: 37 mmHg — AB (ref 44.0–60.0)
PO2 VEN: 48 mmHg — AB (ref 32.0–45.0)
TCO2: 31 mmol/L (ref 0–100)
pH, Ven: 7.51 — ABNORMAL HIGH (ref 7.250–7.430)

## 2017-06-25 LAB — I-STAT TROPONIN, ED: Troponin i, poc: 0.02 ng/mL (ref 0.00–0.08)

## 2017-06-25 LAB — CBC
HEMATOCRIT: 33.9 % — AB (ref 39.0–52.0)
Hemoglobin: 11.1 g/dL — ABNORMAL LOW (ref 13.0–17.0)
MCH: 32.7 pg (ref 26.0–34.0)
MCHC: 32.7 g/dL (ref 30.0–36.0)
MCV: 100 fL (ref 78.0–100.0)
PLATELETS: 104 10*3/uL — AB (ref 150–400)
RBC: 3.39 MIL/uL — ABNORMAL LOW (ref 4.22–5.81)
RDW: 13.9 % (ref 11.5–15.5)
WBC: 4.9 10*3/uL (ref 4.0–10.5)

## 2017-06-25 LAB — HEPATIC FUNCTION PANEL
ALK PHOS: 42 U/L (ref 38–126)
ALT: 20 U/L (ref 17–63)
AST: 25 U/L (ref 15–41)
Albumin: 3.2 g/dL — ABNORMAL LOW (ref 3.5–5.0)
BILIRUBIN TOTAL: 0.6 mg/dL (ref 0.3–1.2)
Bilirubin, Direct: 0.2 mg/dL (ref 0.1–0.5)
Indirect Bilirubin: 0.4 mg/dL (ref 0.3–0.9)
TOTAL PROTEIN: 6.5 g/dL (ref 6.5–8.1)

## 2017-06-25 LAB — D-DIMER, QUANTITATIVE: D-Dimer, Quant: 0.98 ug/mL-FEU — ABNORMAL HIGH (ref 0.00–0.50)

## 2017-06-25 LAB — GLUCOSE, CAPILLARY: Glucose-Capillary: 152 mg/dL — ABNORMAL HIGH (ref 65–99)

## 2017-06-25 LAB — TROPONIN I: Troponin I: 0.03 ng/mL (ref <0.03)

## 2017-06-25 MED ORDER — EZETIMIBE 10 MG PO TABS
10.0000 mg | ORAL_TABLET | Freq: Every day | ORAL | Status: DC
  Administered 2017-06-25: 10 mg via ORAL
  Filled 2017-06-25: qty 1

## 2017-06-25 MED ORDER — SODIUM CHLORIDE 0.9 % IV SOLN: 250.0000 mL | INTRAVENOUS | Status: DC | PRN

## 2017-06-25 MED ORDER — SODIUM CHLORIDE 0.9% FLUSH: 3.0000 mL | INTRAVENOUS | Status: DC | PRN

## 2017-06-25 MED ORDER — FENTANYL CITRATE (PF) 100 MCG/2ML IJ SOLN
50.0000 ug | Freq: Once | INTRAMUSCULAR | Status: AC
  Administered 2017-06-25: 50 ug via INTRAVENOUS
  Filled 2017-06-25: qty 2

## 2017-06-25 MED ORDER — ENOXAPARIN SODIUM 30 MG/0.3ML ~~LOC~~ SOLN
30.0000 mg | SUBCUTANEOUS | Status: DC
Start: 2017-06-25 — End: 2017-06-26
  Administered 2017-06-25: 30 mg via SUBCUTANEOUS
  Filled 2017-06-25: qty 0.3

## 2017-06-25 MED ORDER — ONDANSETRON HCL 4 MG/2ML IJ SOLN: 4.0000 mg | Freq: Four times a day (QID) | INTRAMUSCULAR | Status: DC | PRN

## 2017-06-25 MED ORDER — CARVEDILOL 12.5 MG PO TABS
12.5000 mg | ORAL_TABLET | Freq: Two times a day (BID) | ORAL | Status: DC
  Administered 2017-06-25: 12.5 mg via ORAL
  Filled 2017-06-25 (×2): qty 1

## 2017-06-25 MED ORDER — ADULT MULTIVITAMIN W/MINERALS CH
1.0000 | ORAL_TABLET | Freq: Every day | ORAL | Status: DC
Start: 2017-06-26 — End: 2017-06-26
  Administered 2017-06-26: 1 via ORAL
  Filled 2017-06-25: qty 1

## 2017-06-25 MED ORDER — NITROGLYCERIN 0.4 MG SL SUBL: 0.4000 mg | SUBLINGUAL_TABLET | SUBLINGUAL | Status: DC | PRN

## 2017-06-25 MED ORDER — SODIUM CHLORIDE 0.9% FLUSH
3.0000 mL | Freq: Two times a day (BID) | INTRAVENOUS | Status: DC
  Administered 2017-06-25 – 2017-06-26 (×2): 3 mL via INTRAVENOUS

## 2017-06-25 MED ORDER — ACETAMINOPHEN 325 MG PO TABS: 650.0000 mg | ORAL_TABLET | ORAL | Status: DC | PRN

## 2017-06-25 MED ORDER — ONDANSETRON HCL 4 MG/2ML IJ SOLN
4.0000 mg | Freq: Once | INTRAMUSCULAR | Status: AC
  Administered 2017-06-25: 4 mg via INTRAVENOUS
  Filled 2017-06-25: qty 2

## 2017-06-25 MED ORDER — LATANOPROST 0.005 % OP SOLN
1.0000 [drp] | Freq: Every day | OPHTHALMIC | Status: DC
  Administered 2017-06-25: 1 [drp] via OPHTHALMIC
  Filled 2017-06-25: qty 2.5

## 2017-06-25 MED ORDER — GLIMEPIRIDE 1 MG PO TABS
1.0000 mg | ORAL_TABLET | Freq: Every day | ORAL | Status: DC
  Administered 2017-06-26: 1 mg via ORAL
  Filled 2017-06-25 (×2): qty 1

## 2017-06-25 MED ORDER — ISOSORBIDE MONONITRATE ER 60 MG PO TB24
60.0000 mg | ORAL_TABLET | Freq: Every day | ORAL | Status: DC
  Administered 2017-06-26: 60 mg via ORAL
  Filled 2017-06-25: qty 1

## 2017-06-25 MED ORDER — TAMSULOSIN HCL 0.4 MG PO CAPS
0.4000 mg | ORAL_CAPSULE | Freq: Every day | ORAL | Status: DC
  Administered 2017-06-26: 0.4 mg via ORAL
  Filled 2017-06-25: qty 1

## 2017-06-25 MED ORDER — VITAMIN D 1000 UNITS PO TABS
1000.0000 [IU] | ORAL_TABLET | Freq: Every day | ORAL | Status: DC
Start: 2017-06-26 — End: 2017-06-26

## 2017-06-25 MED ORDER — ASPIRIN EC 325 MG PO TBEC
325.0000 mg | DELAYED_RELEASE_TABLET | Freq: Every day | ORAL | Status: DC
  Filled 2017-06-25: qty 1

## 2017-06-25 MED ORDER — PANTOPRAZOLE SODIUM 40 MG PO TBEC
40.0000 mg | DELAYED_RELEASE_TABLET | Freq: Every day | ORAL | Status: DC
  Administered 2017-06-26: 40 mg via ORAL
  Filled 2017-06-25: qty 1

## 2017-06-25 MED ORDER — HYDRALAZINE HCL 25 MG PO TABS
37.5000 mg | ORAL_TABLET | Freq: Three times a day (TID) | ORAL | Status: DC
  Administered 2017-06-25: 37.5 mg via ORAL
  Filled 2017-06-25 (×3): qty 2

## 2017-06-25 MED ORDER — FUROSEMIDE 10 MG/ML IJ SOLN
80.0000 mg | Freq: Once | INTRAMUSCULAR | Status: AC
  Administered 2017-06-25: 80 mg via INTRAVENOUS
  Filled 2017-06-25: qty 8

## 2017-06-25 MED ORDER — INSULIN ASPART 100 UNIT/ML ~~LOC~~ SOLN
0.0000 [IU] | Freq: Three times a day (TID) | SUBCUTANEOUS | Status: DC
  Administered 2017-06-26: 2 [IU] via SUBCUTANEOUS

## 2017-06-25 NOTE — ED Notes (Signed)
EKG done and given to dr Ellender Hose, repeat EKG done per MD's request.

## 2017-06-25 NOTE — ED Provider Notes (Signed)
[facility] DEPT Provider Note   CSN: [id number] Arrival date & time: 06/25/17  1100     History   Chief Complaint Chief Complaint  Patient presents with  . Chest Pain    HPI [name] [name] is a 83 y.o. male.  HPI   83 yo M with h/o CAD, CHF (EF25-30%), followed by HF clinic, here with left-sided CP. Pt states he was well until walking in his house just prior to arrival, when he developed acute onset of aching, throbbing, left-sided CP with SOB. He has since had persistent pain as well as a sensation that he cannot catch a breath, with deep, shallow breathing and difficulty speaking. He has had some mild LE swelling but no overt orthopnea. No CP prior to this. No recent med changes. He is similar to his usual weight according to family. No fevers or sputum production.  Past Medical History:  Diagnosis Date  . Anemia   . Anxiety   . Aortic stenosis    a. s/p AVR with 78mm Edwards pericardial valve 02/07/12 - post-op course complicated by pleural effusion requring thoracentesis/leg cellulitis 03/2012.  . Baker's cyst 07/23/12   Incidental finding of LE venous dopplers  . Blood transfusion    NO REACTION TO TRANSFUSION  . CAD (coronary artery disease)    a. s/p NSTEMI 12/2011:  LHC - Ostial left main 20%, ostial LAD 50%, mid 60-70%, ostial D1 40% and mid 40%, D2 70%, ostial circumflex occluded, ostial RCA 80-90%, LVEDP was 42. b.  s/p CABG x 3 at time of AVR (LiMA-LAD, SVG-2nd daigonal, SVG-PDA) 02/07/12 (post-op course noted above).  . Cancer [facility]) '90's   Colon  . Cataract    right eye, hx of  . Cellulitis    a. RLE cellulitis 2 months post-operatively after AVR/CABG - serratia marcessans, tx with I&D/antibiotics  . CHF (congestive heart failure) ([place])   . Chronic kidney disease   . Chronic systolic heart failure ([place])    a. TEE 01/2012: EF 25-30%, diffuse hypokinesis. b. Not on ACEI due to renal insufficiency.;  c. follow up  echo 08/06/12: EF 25%, mod diast dysfxn, AVR ok,  mild MR, mod LAE, mild RAE, mild to mod RV systolic dysfunction  . Diabetes mellitus    borderline  . Flash pulmonary edema ([place])    Post-cath 12/2011, went into acute pulm edema requiring IV lasix and intubation  . GERD (gastroesophageal reflux disease)   . History of colon cancer    s/p colon resection  . HLD (hyperlipidemia)   . HTN (hypertension)    primary, Dr. Hollace Kinnier  . Ischemic cardiomyopathy   . Mitral regurgitation    Moderate by TEE 01/2012  . Myocardial infarction ([place]) 12/2011  . Pleural effusion    a. post-operatively after AVR/CABG s/p thoracentesis 03/2012 yielding 1L serosanguinous fluid.  . Stroke ([place]) 10/01   RIght Leg weakness    Patient Active Problem List   Diagnosis Date Noted  . Pneumonia 10/23/2015  . HCAP (healthcare-associated pneumonia) 10/23/2015  . Pleuritic chest pain 10/23/2015  . Chest pain 09/20/2015  . Candidiasis of mouth 09/20/2015  . Elevated troponin 09/20/2015  . Cough 09/20/2015  . Inguinal hernia unilateral, non-recurrent 09/13/2015  . Chronic kidney disease, stage III (moderate): Cr ~2.2 baseline 08/23/2015  . Advanced care planning/counseling discussion 08/23/2015  . Diabetes mellitus type 2, controlled, with complications ([place]) 34/19/6222  . Right inguinal hernia 08/23/2015  . DM2 (diabetes mellitus, type 2) ([place]) 06/29/2014  . Fever 06/28/2014  .  Abdominal pain 06/28/2014  . Elevated liver function tests 06/28/2014  . S/P AVR (aortic valve replacement) 08/18/2013  . Bacteremia due to Gram-negative bacteria 04/10/2013  . Sepsis associated hypotension ([place]) 04/09/2013  . Sepsis ([place]) 04/09/2013  . Hypotension, unspecified 04/09/2013  . Hyperkalemia 03/06/2013  . S/P CABG x 3: LIMA-LAD, SVG-D2, SVG-rPDA 09/17/2012  . Pleural effusion 07/26/2012  . Cellulitis and abscess of leg 04/11/2012  . Atherosclerosis of native coronary artery-- s/p CABG 01/11/2012  . Chronic combined systolic and diastolic heart failure, NYHA class 2  ([place]) 01/11/2012  . Ischemic cardiomyopathy 01/11/2012  . CKD (chronic kidney disease) 01/11/2012  . HLD (hyperlipidemia) 01/11/2012  . Hypokalemia 12/17/2011  . NSTEMI (non-ST elevated myocardial infarction) ([place]) 12/13/2011    Class: Acute  . Acute on chronic renal failure ([place]) 12/13/2011  . Anemia due to chronic kidney disease 12/13/2011  . Essential hypertension 02/03/2009    Past Surgical History:  Procedure Laterality Date  . AORTIC VALVE REPLACEMENT  02/07/2012   Procedure: AORTIC VALVE REPLACEMENT (AVR);  Surgeon: Gaye Pollack, MD;  Location: [facility];  Service: Open Heart Surgery;  Laterality: N/A;  . CARDIAC CATHETERIZATION     1.3.13  stopped breathing, put on ventilator for 5-6 days  . CATARACT EXTRACTION     rt  . CHEST TUBE INSERTION  07/24/2012   Procedure: INSERTION PLEURAL DRAINAGE CATHETER;  Surgeon: Gaye Pollack, MD;  Location: [facility];  Service: Thoracic;  Laterality: Left;  . COLON RESECTION  1996  . COLONOSCOPY    . CORONARY ARTERY BYPASS GRAFT  02/07/2012   Procedure: CORONARY ARTERY BYPASS GRAFTING (CABG);  Surgeon: Gaye Pollack, MD;  Location: [facility];  Service: Open Heart Surgery;  Laterality: N/A;  CABG x three; using right leg greater saphenous vein harvested endoscopically  . ERCP N/A 07/08/2014   Procedure: ENDOSCOPIC RETROGRADE CHOLANGIOPANCREATOGRAPHY (ERCP);  Surgeon: Missy Sabins, MD;  Location: [facility] ENDOSCOPY;  Service: Endoscopy;  Laterality: N/A;  . ESOPHAGEAL DILATION    . ESOPHAGOGASTRODUODENOSCOPY N/A 07/03/2014   Procedure: ESOPHAGOGASTRODUODENOSCOPY (EGD);  Surgeon: Arta Silence, MD;  Location: [facility] ENDOSCOPY;  Service: Endoscopy;  Laterality: N/A;  . ESOPHAGOSCOPY WITH DILITATION N/A 07/07/2014   Procedure: ESOPHAGOSCOPY WITH DILITATION/SAVARY DILATOR;  Surgeon: Rozetta Nunnery, MD;  Location: [facility];  Service: ENT;  Laterality: N/A;  . EYE SURGERY  2009   cataract removed from right eye  . INGUINAL HERNIA REPAIR Right 09/13/2015   Procedure:  OPEN RIGHT INGUINAL HERNIA;  Surgeon: Mickeal Skinner, MD;  Location: [facility];  Service: General;  Laterality: Right;  . INSERTION OF MESH Right 09/13/2015   Procedure: INSERTION OF MESH;  Surgeon: Mickeal Skinner, MD;  Location: [facility];  Service: General;  Laterality: Right;  . LEFT HEART CATHETERIZATION WITH CORONARY ANGIOGRAM N/A 12/13/2011   Procedure: LEFT HEART CATHETERIZATION WITH CORONARY ANGIOGRAM;  Surgeon: Peter M Martinique, MD;  Location: [facility] CATH LAB;  Service: Cardiovascular;  Laterality: N/A;  . REMOVAL OF PLEURAL DRAINAGE CATHETER  09/27/2012   Procedure: REMOVAL OF PLEURAL DRAINAGE CATHETER;  Surgeon: Gaye Pollack, MD;  Location: [facility] [place];  Service: Thoracic;  Laterality: Left;  . TALC PLEURODESIS  08/30/2012   Procedure: Pietro Cassis;  Surgeon: Gaye Pollack, MD;  Location: [facility];  Service: Thoracic;  Laterality: Left;  INSERTION OF TALC VIA LEFT PLEURX  . TALC PLEURODESIS  09/12/2012   Procedure: Pietro Cassis;  Surgeon: Gaye Pollack, MD;  Location: [facility];  Service: Thoracic;  Laterality:  Left;  . TONSILLECTOMY         Home Medications    Prior to Admission medications   Medication Sig Start Date End Date Taking? Authorizing Provider  aspirin EC 325 MG tablet Take 325 mg by mouth daily.    Yes [provider]  carvedilol (COREG) 25 MG tablet Take 1 tablet (25 mg total) by mouth 2 (two) times daily with a meal. 04/12/17  Yes Tillery, Satira Mccallum, PA-C  cholecalciferol (VITAMIN D) 1000 UNITS tablet Take 1,000 Units by mouth daily with supper.    Yes [provider]  furosemide (LASIX) 40 MG tablet Take 2 tablets (80 mg total) by mouth daily. Patient taking differently: Take 40 mg by mouth 2 (two) times daily.  11/09/16  Yes Bensimhon, Shaune Pascal, MD  glimepiride (AMARYL) 1 MG tablet TAKE 1 TABLET BY MOUTH DAILY WITH BREAKFAST 06/05/17  Yes [name], [name] L, DO  hydrALAZINE (APRESOLINE) 25 MG tablet TAKE 1 TABLET BY MOUTH THREE TIMES DAILY 05/03/17  Yes  [name], [name] L, DO  isosorbide mononitrate (IMDUR) 60 MG 24 hr tablet TAKE 1 TABLET BY MOUTH EVERY DAY 06/18/17  Yes Bensimhon, Shaune Pascal, MD  Multiple Vitamin (MULTIVITAMIN WITH MINERALS) TABS tablet Take 1 tablet by mouth daily.   Yes [provider]  nitroGLYCERIN (NITROSTAT) 0.4 MG SL tablet Place 0.4 mg under the tongue every 5 (five) minutes as needed for chest pain. As needed   Yes [provider]  omeprazole (PRILOSEC) 20 MG capsule Take 20 mg by mouth daily.    Yes [provider]  tamsulosin (FLOMAX) 0.4 MG CAPS capsule TAKE 1 CAPSULE BY MOUTH EVERY DAY AFTER DINNER 05/23/17  Yes [name], [name] L, DO  Travoprost, BAK Free, (TRAVATAN) 0.004 % SOLN ophthalmic solution Place 1 drop into both eyes at bedtime.    Yes [provider]  FREESTYLE LITE test strip USE TO TEST BLOOD SUGARS ONCE DAILY 12/25/16   [name], [name] L, DO  Lancets (FREESTYLE) lancets Use one lancet in morning before breakfast to check blood sugar.  Dx E11.8 12/14/15   Gayland Curry, DO    Family History Family History  Problem Relation Age of Onset  . Cancer Mother   . Heart attack Father   . Mental illness Brother   . Kidney failure Brother     Social History Social History  Substance Use Topics  . Smoking status: Never Smoker  . Smokeless tobacco: Never Used  . Alcohol use No     Allergies   Statins   Review of Systems Review of Systems  Constitutional: Positive for fatigue.  Respiratory: Positive for chest tightness and shortness of breath.   Cardiovascular: Positive for chest pain.  Neurological: Positive for light-headedness.  All other systems reviewed and are negative.    Physical Exam Updated Vital Signs BP (!) 124/56   Pulse (!) 52   Temp 98.4 F (36.9 C) (Oral)   Resp (!) 23   Ht 5\' 8"  (1.727 m)   Wt 67.6 kg (149 lb)   SpO2 95%   BMI 22.66 kg/m   Physical Exam  Constitutional: He is oriented to person, place, and time. He appears well-developed  and well-nourished. He appears distressed.  HENT:  Head: Normocephalic and atraumatic.  Eyes: Conjunctivae are normal.  Neck: Neck supple.  Cardiovascular: Regular rhythm.  Bradycardia present.  Exam reveals no friction rub.   Murmur heard.  Systolic murmur is present with a grade of 2/6  Pulmonary/Chest: Effort normal.  Tachypnea noted. No respiratory distress. He has decreased breath sounds. He has no wheezes. He has rales in the right lower field and the left lower field.  Abdominal: He exhibits no distension.  Musculoskeletal: He exhibits no edema.  Neurological: He is alert and oriented to person, place, and time. He exhibits normal muscle tone.  Skin: Skin is warm. Capillary refill takes less than 2 seconds.  Psychiatric: He has a normal mood and affect.  Nursing note and vitals reviewed.    ED Treatments / Results  Labs (all labs ordered are listed, but only abnormal results are displayed) Labs Reviewed  BASIC METABOLIC PANEL - Abnormal; Notable for the following:       Result Value   Glucose, Bld 266 (*)    BUN 41 (*)    Creatinine, Ser 2.46 (*)    GFR calc non Af Amer 22 (*)    GFR calc Af Amer 25 (*)    All other components within normal limits  CBC - Abnormal; Notable for the following:    RBC 3.39 (*)    Hemoglobin 11.1 (*)    HCT 33.9 (*)    Platelets 104 (*)    All other components within normal limits  HEPATIC FUNCTION PANEL - Abnormal; Notable for the following:    Albumin 3.2 (*)    All other components within normal limits  BRAIN NATRIURETIC PEPTIDE - Abnormal; Notable for the following:    B Natriuretic Peptide 491.0 (*)    All other components within normal limits  D-DIMER, QUANTITATIVE (NOT AT [facility]) - Abnormal; Notable for the following:    D-Dimer, Quant 0.98 (*)    All other components within normal limits  I-STAT VENOUS BLOOD GAS, ED - Abnormal; Notable for the following:    pH, Ven 7.510 (*)    pCO2, Ven 37.0 (*)    pO2, Ven 48.0 (*)     Bicarbonate 29.5 (*)    Acid-Base Excess 6.0 (*)    All other components within normal limits  PROTIME-INR  LIPASE, BLOOD  I-STAT TROPOININ, ED    EKG  EKG Interpretation  Date/Time:  Monday June 25 2017 11:18:27 EDT Ventricular Rate:  56 PR Interval:    QRS Duration: 111 QT Interval:  455 QTC Calculation: 440 R Axis:   -54 Text Interpretation:  Sinus rhythm Prolonged PR interval LVH with IVCD, LAD and secondary repol abnrm Since last EKG, TW now peaked in anterior precordial leads TWI in lateral leads is more prominent Confirmed by Duffy Bruce [phone]) on 06/25/2017 11:35:08 AM       Radiology Ct Abdomen Pelvis Wo Contrast  Result Date: 06/25/2017 CLINICAL DATA:  Left flank pain, left lateral chest wall pain seen abdominal distension EXAM: CT CHEST, ABDOMEN AND PELVIS WITHOUT CONTRAST TECHNIQUE: Multidetector CT imaging of the chest, abdomen and pelvis was performed following the standard protocol without IV contrast. COMPARISON:  None. FINDINGS: CT CHEST FINDINGS Cardiovascular: No significant vascular findings. Enlarged heart size. Prior CABG. Thoracic aortic atherosclerosis. No pericardial effusion. Mediastinum/Nodes: No enlarged mediastinal, hilar, or axillary lymph nodes. Thyroid gland, trachea, and esophagus demonstrate no significant findings. Lungs/Pleura: 5 mm subpleural right upper lobe pulmonary nodule. Trace right pleural effusion. Calcified left lower pleural plaques. No focal consolidation. No pneumothorax. Musculoskeletal: No acute osseous abnormality. No lytic or sclerotic osseous lesion. CT ABDOMEN PELVIS FINDINGS Hepatobiliary: No focal liver abnormality is seen. Prior right hepatic resection versus focal atrophy. Status post cholecystectomy. No biliary dilatation. Pancreas: Unremarkable. No pancreatic ductal dilatation  or surrounding inflammatory changes. Spleen: Normal in size without focal abnormality. Adrenals/Urinary Tract: Normal adrenal glands. Left renal  atrophy. Normal right kidney. 5 x 4.2 cm hypodense, fluid attenuating exophytic left renal mass most consistent with a cyst. Normal bladder. Stomach/Bowel: No bowel dilatation or bowel wall thickening. No pneumatosis pneumoperitoneum or portal venous gas. Sigmoid diverticulosis without evidence of diverticulitis. Vascular/Lymphatic: Abdominal aortic atherosclerosis. Normal caliber abdominal aorta. No lymphadenopathy. Reproductive: Enlarged prostate gland. Other: Fat containing left inguinal hernia containing a small portion of the sigmoid colon. No abdominopelvic ascites. Musculoskeletal: No acute osseous abnormality. No lytic or sclerotic osseous lesion. Cyst bilateral facet arthropathy at L5-S1. IMPRESSION: 1. No acute abnormality of the chest, abdomen or pelvis. 2. Sigmoid diverticulosis without evidence of diverticulitis. 3. Fat containing left inguinal hernia containing a small portion of the sigmoid colon. 4.  Aortic Atherosclerosis (ICD10-170.0) 5. 5 mm subpleural right upper lobe pulmonary nodule. If the patient is at high risk for bronchogenic carcinoma, follow-up chest CT at 6-12 months is recommended. If the patient is at low risk for bronchogenic carcinoma, follow-up chest CT at 12 months is recommended. This recommendation follows the consensus statement: Guidelines for Management of Small Pulmonary Nodules Detected on CT Scans: A Statement from the [place] as published in Radiology 2005;237:395-400. Electronically Signed   By: Kathreen Devoid   On: 06/25/2017 14:06   Dg Chest 2 View  Result Date: 06/25/2017 CLINICAL DATA:  Left-sided chest pressure today. Shortness of breath. EXAM: CHEST  2 VIEW COMPARISON:  11/19/2015 and 10/22/2015 radiographs. FINDINGS: Stable cardiomegaly and aortic atherosclerosis post CABG and aortic valve replacement. There is lesser definition of the pulmonary vasculature, suspicious for mild pulmonary edema. Mild atelectasis or scarring is present at both lung  bases. There is no pneumothorax or significant pleural effusion. Degenerative changes are present throughout the spine. IMPRESSION: Cardiomegaly with suspected mild pulmonary edema. Electronically Signed   By: Richardean Sale M.D.   On: 06/25/2017 11:55   Ct Chest Wo Contrast  Result Date: 06/25/2017 CLINICAL DATA:  Left flank pain, left lateral chest wall pain seen abdominal distension EXAM: CT CHEST, ABDOMEN AND PELVIS WITHOUT CONTRAST TECHNIQUE: Multidetector CT imaging of the chest, abdomen and pelvis was performed following the standard protocol without IV contrast. COMPARISON:  None. FINDINGS: CT CHEST FINDINGS Cardiovascular: No significant vascular findings. Enlarged heart size. Prior CABG. Thoracic aortic atherosclerosis. No pericardial effusion. Mediastinum/Nodes: No enlarged mediastinal, hilar, or axillary lymph nodes. Thyroid gland, trachea, and esophagus demonstrate no significant findings. Lungs/Pleura: 5 mm subpleural right upper lobe pulmonary nodule. Trace right pleural effusion. Calcified left lower pleural plaques. No focal consolidation. No pneumothorax. Musculoskeletal: No acute osseous abnormality. No lytic or sclerotic osseous lesion. CT ABDOMEN PELVIS FINDINGS Hepatobiliary: No focal liver abnormality is seen. Prior right hepatic resection versus focal atrophy. Status post cholecystectomy. No biliary dilatation. Pancreas: Unremarkable. No pancreatic ductal dilatation or surrounding inflammatory changes. Spleen: Normal in size without focal abnormality. Adrenals/Urinary Tract: Normal adrenal glands. Left renal atrophy. Normal right kidney. 5 x 4.2 cm hypodense, fluid attenuating exophytic left renal mass most consistent with a cyst. Normal bladder. Stomach/Bowel: No bowel dilatation or bowel wall thickening. No pneumatosis pneumoperitoneum or portal venous gas. Sigmoid diverticulosis without evidence of diverticulitis. Vascular/Lymphatic: Abdominal aortic atherosclerosis. Normal caliber  abdominal aorta. No lymphadenopathy. Reproductive: Enlarged prostate gland. Other: Fat containing left inguinal hernia containing a small portion of the sigmoid colon. No abdominopelvic ascites. Musculoskeletal: No acute osseous abnormality. No lytic or sclerotic osseous lesion. Cyst bilateral facet arthropathy  at L5-S1. IMPRESSION: 1. No acute abnormality of the chest, abdomen or pelvis. 2. Sigmoid diverticulosis without evidence of diverticulitis. 3. Fat containing left inguinal hernia containing a small portion of the sigmoid colon. 4.  Aortic Atherosclerosis (ICD10-170.0) 5. 5 mm subpleural right upper lobe pulmonary nodule. If the patient is at high risk for bronchogenic carcinoma, follow-up chest CT at 6-12 months is recommended. If the patient is at low risk for bronchogenic carcinoma, follow-up chest CT at 12 months is recommended. This recommendation follows the consensus statement: Guidelines for Management of Small Pulmonary Nodules Detected on CT Scans: A Statement from the [place] as published in Radiology 2005;237:395-400. Electronically Signed   By: Kathreen Devoid   On: 06/25/2017 14:06    Procedures Procedures (including critical care time)  Medications Ordered in ED Medications  fentaNYL (SUBLIMAZE) injection 50 mcg (50 mcg Intravenous Given 06/25/17 1230)  ondansetron (ZOFRAN) injection 4 mg (4 mg Intravenous Given 06/25/17 1230)  furosemide (LASIX) injection 80 mg (80 mg Intravenous Given 06/25/17 1443)     Initial Impression / Assessment and Plan / ED Course  I have reviewed the triage vital signs and the nursing notes.  Pertinent labs & imaging results that were available during my care of the patient were reviewed by me and considered in my medical decision making (see chart for details).     83 yo M with PMHx as above here with acute onset SOB, left-sided CP. He is in marked resp distress on arrival but satting well; exam does show b/l rales. CXR obtained and is c/f  cardiomegaly with pulmonary edema. Lab work overall near baseline, though with elevated BNP. Trop neg, no signs of ACS on EKG. Given concerning history and presentation, CT scan obtained to eval for occult PNA, aortic abnormality (limited 2/2 no contrast, but no signs of aortic widening, aortic aneurysm), or intra-abdominal pathology as some of his sx may be c/w diaphragmatic irritation/spasm. Fortunately, no evidence of surgical abnormality. Will consult cardiology and admit. IV lasix given.  Final Clinical Impressions(s) / ED Diagnoses   Final diagnoses:  Acute on chronic systolic congestive heart failure ([place])    New Prescriptions New Prescriptions   No medications on file     Duffy Bruce, MD 06/25/17 1606

## 2017-06-25 NOTE — ED Notes (Signed)
Patient given water and applesauce.

## 2017-06-25 NOTE — ED Triage Notes (Signed)
Patient comes in per [facility] with left sided chest pressure, started when he was in the BR. Does not radiate. Sob noted. Tenderness upon palpation. No recent falls. No crepitus. 325 mg aspirin. Hx of CABG 5 yrs ago. cbg 264. Hx of HTN. 20 LH. 3 nitro given, 10/10 to 9/10 pain. 130/60, 68 HR, 97% RA. Denies n/v. Not diaphoretic. EKG unremarkable.

## 2017-06-25 NOTE — Progress Notes (Signed)
ANTICOAGULATION CONSULT NOTE - Initial Consult  Pharmacy Consult for Lovenox Indication: VTE prophylaxis  Allergies  Allergen Reactions  . Statins Other (See Comments)    Pain/weakness in legs    Patient Measurements: Height: 5\' 9"  (175.3 cm) Weight: 154 lb (69.9 kg) IBW/kg (Calculated) : 70.7  Vital Signs: Temp: 98 F (36.7 C) (07/16 2037) Temp Source: Oral (07/16 2037) BP: 158/66 (07/16 2037) Pulse Rate: 56 (07/16 2037)  Labs:  Recent Labs  06/25/17 1114 06/25/17 1817  HGB 11.1*  --   HCT 33.9*  --   PLT 104*  --   LABPROT 13.8  --   INR 1.06  --   CREATININE 2.46*  --   TROPONINI  --  0.03*    Estimated Creatinine Clearance: 20.1 mL/min (A) (by C-G formula based on SCr of 2.46 mg/dL (H)).   Medical History: Past Medical History:  Diagnosis Date  . Anemia   . Anxiety   . Aortic stenosis    a. s/p AVR with 38mm Edwards pericardial valve 02/07/12 - post-op course complicated by pleural effusion requring thoracentesis/leg cellulitis 03/2012.  . Baker's cyst 07/23/12   Incidental finding of LE venous dopplers  . Blood transfusion    NO REACTION TO TRANSFUSION  . CAD (coronary artery disease)    a. s/p NSTEMI 12/2011:  LHC - Ostial left main 20%, ostial LAD 50%, mid 60-70%, ostial D1 40% and mid 40%, D2 70%, ostial circumflex occluded, ostial RCA 80-90%, LVEDP was 42. b.  s/p CABG x 3 at time of AVR (LiMA-LAD, SVG-2nd daigonal, SVG-PDA) 02/07/12 (post-op course noted above).  . Cancer [facility]) '90's   Colon  . Cataract    right eye, hx of  . Cellulitis    a. RLE cellulitis 2 months post-operatively after AVR/CABG - serratia marcessans, tx with I&D/antibiotics  . CHF (congestive heart failure) ([place])   . Chronic kidney disease   . Chronic systolic heart failure ([place])    a. TEE 01/2012: EF 25-30%, diffuse hypokinesis. b. Not on ACEI due to renal insufficiency.;  c. follow up  echo 08/06/12: EF 25%, mod diast dysfxn, AVR ok, mild MR, mod LAE, mild RAE, mild to mod RV  systolic dysfunction  . Diabetes mellitus    borderline  . Flash pulmonary edema ([place])    Post-cath 12/2011, went into acute pulm edema requiring IV lasix and intubation  . GERD (gastroesophageal reflux disease)   . History of colon cancer    s/p colon resection  . HLD (hyperlipidemia)   . HTN (hypertension)    primary, Dr. Hollace Kinnier  . Ischemic cardiomyopathy   . Mitral regurgitation    Moderate by TEE 01/2012  . Myocardial infarction ([place]) 12/2011  . Pleural effusion    a. post-operatively after AVR/CABG s/p thoracentesis 03/2012 yielding 1L serosanguinous fluid.  . Stroke [facility]) 10/01   RIght Leg weakness    Assessment:   83 yr old male to begin Lovenox for VTE prophylaxis.   CKD, creatinine clearance ~20 ml/min.   Thrombocytopenia.  Platelet counts 124-164 since Feb 2017.   Elevated d-dimer (0.98), VQ scan pending.  Goal of Therapy:  appropriate Lovenox dose for indication and renal function  Monitor platelets by anticoagulation protocol: Yes   Plan:   Lovenox 30 mg sq q24hrs.  Follow renal function, platelet count, VQ scan.  Consider changing to SCDs if platelet declines.  Monitor for any bleeding.  Arty Baumgartner, [place] Pager: [phone] 06/25/2017,9:10 PM

## 2017-06-25 NOTE — ED Notes (Signed)
Patient to xray/CT

## 2017-06-25 NOTE — H&P (Signed)
Advanced Heart Failure Team History and Physical Note   Primary Cardiologist: Dr. Aundra Dubin  Reason for Admission: A/C systolic CHF.  Chest pain.   HPI:    [name] [name] is a 83 y.o. male with a PMHx of CAD, CABG, bioprosthetic AVR, anemia, CKD last creatinine 2.2, Chronic systolic heart failure EF 40-45%. He is not on an ACE or ARB due renal failure.   Last seen in HF clinic 04/12/2017 as he was running out of medicine/refills.Prior to that had not been seen since 11/2015. Was doing well overall. BP mildly elevated but consistent with long history of [place] HTN per patient.  Pt presented to [facility] with left flank and chest pain with SOB that started this am. Pertinent labs include K 4.7, Creatinine 2.46, BNP 491, WBC 4.9, and Hgb 11.1.  CXR with cardiomegaly and mild pulmonary edema suspected. CT chest/abd/pelvis with no acute abnormalities. 8mm RUL nodule noted.   Pt states this am he had a normal BM, as he does every morning. On the way back to the living room, he had 9/10 chest PRESSURE that felt like "someone was holding a foot ball against my chest and stomach".  Did not resolved with rest, worsened to 10/10. Mildly relieved with NTG by EMS. Had begun to subside by the time he reached ED, and is now nearly completely gone with one dose fentanyl several hours ago. Denies orthopnea, lightheadedness or dizziness. Was in his USOH until yesterday. Takes lasix 40 mg BID, hasn't taken any extra. No sick contacts, fevers, or chills. No weight gain.  NYHA III symptoms (Can walk short distance prior to becoming SOB). No peripheral edema.  Review of Systems: [y] = yes, [ ]  = no   General: Weight gain [ ] ; Weight loss [ ] ; Anorexia [ ] ; Fatigue [ ] ; Fever [ ] ; Chills [ ] ; Weakness [ ]   Cardiac: Chest pain/pressure [y]; Resting SOB [ ] ; Exertional SOB [y]; Orthopnea [ ] ; Pedal Edema [ ] ; Palpitations [ ] ; Syncope [ ] ; Presyncope [ ] ; Paroxysmal nocturnal dyspnea[ ]   Pulmonary: Cough [ ] ; Wheezing[ ] ;  Hemoptysis[ ] ; Sputum [ ] ; Snoring [ ]   GI: Vomiting[ ] ; Dysphagia[ ] ; Melena[ ] ; Hematochezia [ ] ; Heartburn[ ] ; Abdominal pain [y]; Constipation [ ] ; Diarrhea [ ] ; BRBPR [ ]   GU: Hematuria[ ] ; Dysuria [ ] ; Nocturia[ ]   Vascular: Pain in legs with walking [ ] ; Pain in feet with lying flat [ ] ; Non-healing sores [ ] ; Stroke [ ] ; TIA [ ] ; Slurred speech [ ] ;  Neuro: Headaches[ ] ; Vertigo[ ] ; Seizures[ ] ; Paresthesias[ ] ;Blurred vision [ ] ; Diplopia [ ] ; Vision changes [ ]   Ortho/Skin: Arthritis [y]; Joint pain [y]; Muscle pain [ ] ; Joint swelling [ ] ; Back Pain [ ] ; Rash [ ]   Psych: Depression[ ] ; Anxiety[ ]   Heme: Bleeding problems [ ] ; Clotting disorders [ ] ; Anemia [ ]   Endocrine: Diabetes [ ] ; Thyroid dysfunction[ ]    Home Medications Prior to Admission medications   Medication Sig Start Date End Date Taking? Authorizing Provider  aspirin EC 325 MG tablet Take 325 mg by mouth daily.    Yes [provider]  carvedilol (COREG) 25 MG tablet Take 1 tablet (25 mg total) by mouth 2 (two) times daily with a meal. 04/12/17  Yes Tillery, Satira Mccallum, PA-C  cholecalciferol (VITAMIN D) 1000 UNITS tablet Take 1,000 Units by mouth daily with supper.    Yes [provider]  furosemide (LASIX) 40 MG tablet Take 2 tablets (80  mg total) by mouth daily. Patient taking differently: Take 40 mg by mouth 2 (two) times daily.  11/09/16  Yes Bensimhon, Shaune Pascal, MD  glimepiride (AMARYL) 1 MG tablet TAKE 1 TABLET BY MOUTH DAILY WITH BREAKFAST 06/05/17  Yes [name], [name] L, DO  hydrALAZINE (APRESOLINE) 25 MG tablet TAKE 1 TABLET BY MOUTH THREE TIMES DAILY 05/03/17  Yes [name], [name] L, DO  isosorbide mononitrate (IMDUR) 60 MG 24 hr tablet TAKE 1 TABLET BY MOUTH EVERY DAY 06/18/17  Yes Bensimhon, Shaune Pascal, MD  Multiple Vitamin (MULTIVITAMIN WITH MINERALS) TABS tablet Take 1 tablet by mouth daily.   Yes [provider]  nitroGLYCERIN (NITROSTAT) 0.4 MG SL tablet Place 0.4 mg under the tongue  every 5 (five) minutes as needed for chest pain. As needed   Yes [provider]  omeprazole (PRILOSEC) 20 MG capsule Take 20 mg by mouth daily.    Yes [provider]  tamsulosin (FLOMAX) 0.4 MG CAPS capsule TAKE 1 CAPSULE BY MOUTH EVERY DAY AFTER DINNER 05/23/17  Yes [name], [name] L, DO  Travoprost, BAK Free, (TRAVATAN) 0.004 % SOLN ophthalmic solution Place 1 drop into both eyes at bedtime.    Yes [provider]  FREESTYLE LITE test strip USE TO TEST BLOOD SUGARS ONCE DAILY 12/25/16   [name], [name] L, DO  Lancets (FREESTYLE) lancets Use one lancet in morning before breakfast to check blood sugar.  Dx E11.8 12/14/15   Gayland Curry, DO    Past Medical History: Past Medical History:  Diagnosis Date  . Anemia   . Anxiety   . Aortic stenosis    a. s/p AVR with 67mm Edwards pericardial valve 02/07/12 - post-op course complicated by pleural effusion requring thoracentesis/leg cellulitis 03/2012.  . Baker's cyst 07/23/12   Incidental finding of LE venous dopplers  . Blood transfusion    NO REACTION TO TRANSFUSION  . CAD (coronary artery disease)    a. s/p NSTEMI 12/2011:  LHC - Ostial left main 20%, ostial LAD 50%, mid 60-70%, ostial D1 40% and mid 40%, D2 70%, ostial circumflex occluded, ostial RCA 80-90%, LVEDP was 42. b.  s/p CABG x 3 at time of AVR (LiMA-LAD, SVG-2nd daigonal, SVG-PDA) 02/07/12 (post-op course noted above).  . Cancer [facility]) '90's   Colon  . Cataract    right eye, hx of  . Cellulitis    a. RLE cellulitis 2 months post-operatively after AVR/CABG - serratia marcessans, tx with I&D/antibiotics  . CHF (congestive heart failure) ([place])   . Chronic kidney disease   . Chronic systolic heart failure ([place])    a. TEE 01/2012: EF 25-30%, diffuse hypokinesis. b. Not on ACEI due to renal insufficiency.;  c. follow up  echo 08/06/12: EF 25%, mod diast dysfxn, AVR ok, mild [name], mod LAE, mild RAE, mild to mod RV systolic dysfunction  . Diabetes mellitus    borderline    . Flash pulmonary edema ([place])    Post-cath 12/2011, went into acute pulm edema requiring IV lasix and intubation  . GERD (gastroesophageal reflux disease)   . History of colon cancer    s/p colon resection  . HLD (hyperlipidemia)   . HTN (hypertension)    primary, Dr. Hollace Kinnier  . Ischemic cardiomyopathy   . Mitral regurgitation    Moderate by TEE 01/2012  . Myocardial infarction ([place]) 12/2011  . Pleural effusion    a. post-operatively after AVR/CABG s/p thoracentesis 03/2012 yielding 1L serosanguinous fluid.  . Stroke ([place]) 10/01  RIght Leg weakness    Past Surgical History: Past Surgical History:  Procedure Laterality Date  . AORTIC VALVE REPLACEMENT  02/07/2012   Procedure: AORTIC VALVE REPLACEMENT (AVR);  Surgeon: Gaye Pollack, MD;  Location: [place];  Service: Open Heart Surgery;  Laterality: N/A;  . CARDIAC CATHETERIZATION     1.3.13  stopped breathing, put on ventilator for 5-6 days  . CATARACT EXTRACTION     rt  . CHEST TUBE INSERTION  07/24/2012   Procedure: INSERTION PLEURAL DRAINAGE CATHETER;  Surgeon: Gaye Pollack, MD;  Location: [place];  Service: Thoracic;  Laterality: Left;  . COLON RESECTION  1996  . COLONOSCOPY    . CORONARY ARTERY BYPASS GRAFT  02/07/2012   Procedure: CORONARY ARTERY BYPASS GRAFTING (CABG);  Surgeon: Gaye Pollack, MD;  Location: [place];  Service: Open Heart Surgery;  Laterality: N/A;  CABG x three; using right leg greater saphenous vein harvested endoscopically  . ERCP N/A 07/08/2014   Procedure: ENDOSCOPIC RETROGRADE CHOLANGIOPANCREATOGRAPHY (ERCP);  Surgeon: Missy Sabins, MD;  [facility] END[facility]COPY;  Service: Endoscopy;  Laterality: N/A;  . ESOPHAGEAL DILATION    . ESOPHAGOGASTRODUODENOSCOPY N/A 07/03/2014   Procedure: ESOPHAGOGASTRODUODENOSCOPY (EGD);  Surgeon: Arta Silence, MD;  Location: [facility] ENDOSCOPY;  Service: Endoscopy;  Laterality: N/A;  . ESOPHAGOSCOPY WITH DILITATION N/A 07/07/2014   Procedure: ESOPHAGOSCOPY WITH DILITATION/SAVARY  DILATOR;  Surgeon: Rozetta Nunnery, MD;  Location: [facility] OR;  Service: ENT;  Laterality: N/A;  . EYE SURGERY  2009   cataract removed from right eye  . INGUINAL HERNIA REPAIR Right 09/13/2015   Procedure: OPEN RIGHT INGUINAL HERNIA;  Surgeon: Mickeal Skinner, MD;  Location: [place];  Service: General;  Laterality: Right;  . INSERTION OF MESH Right 09/13/2015   Procedure: INSERTION OF MESH;  Surgeon: Mickeal Skinner, MD;  Location: [place];  Service: General;  Laterality: Right;  . LEFT HEART CATHETERIZATION WITH CORONARY ANGIOGRAM N/A 12/13/2011   Procedure: LEFT HEART CATHETERIZATION WITH CORONARY ANGIOGRAM;  Surgeon: Peter M Martinique, MD;  Location: [facility] CATH LAB;  Service: Cardiovascular;  Laterality: N/A;  . REMOVAL OF PLEURAL DRAINAGE CATHETER  09/27/2012   Procedure: REMOVAL OF PLEURAL DRAINAGE CATHETER;  Surgeon: Gaye Pollack, MD;  Location: [place];  Service: Thoracic;  Laterality: Left;  . TALC PLEURODESIS  08/30/2012   Procedure: Pietro Cassis;  Surgeon: Gaye Pollack, MD;  Location: [place];  Service: Thoracic;  Laterality: Left;  INSERTION OF TALC VIA LEFT PLEURX  . TALC PLEURODESIS  09/12/2012   Procedure: Pietro Cassis;  Surgeon: Gaye Pollack, MD;  Location: [place];  Service: Thoracic;  Laterality: Left;  . TONSILLECTOMY      Family History:  Family History  Problem Relation Age of Onset  . Cancer Mother   . Heart attack Father   . Mental illness Brother   . Kidney failure Brother     Social History: Social History   Social History  . Marital status: Married    Spouse name: N/A  . Number of children: N/A  . Years of education: N/A   Occupational History  . retired owned [employer]    Social History Main Topics  . Smoking status: Never Smoker  . Smokeless tobacco: Never Used  . Alcohol use No  . Drug use: No  . Sexual activity: Not Currently   Other Topics Concern  . Not on file   Social History Narrative   Married [name] [name]   Never smoked  Alcohol none   Exercise walking, weights   Living Will, MOST form       Allergies:  Allergies  Allergen Reactions  . Statins Other (See Comments)    Pain/weakness in legs    Objective:    Vital Signs:   Temp:  [98.4 F (36.9 C)] 98.4 F (36.9 C) (07/16 1109) Pulse Rate:  [50-58] 51 (07/16 1345) Resp:  [15-33] 29 (07/16 1345) BP: (119-161)/(45-68) 159/55 (07/16 1345) SpO2:  [96 %-100 %] 98 % (07/16 1345) Weight:  [149 lb (67.6 kg)] 149 lb (67.6 kg) (07/16 1109)   Filed Weights   06/25/17 1109  Weight: 149 lb (67.6 kg)     Physical Exam     General:  Elderly appearing. Mildly dyspneic with conversation.  HEENT: Normal Neck: Supple. JVP ~ 10 cm. Carotids 2+ bilat; no bruits. No lymphadenopathy or thyromegaly appreciated. Cor: PMI nondisplaced. Regular. Loletha Grayer. Occasional ectopy. 1/6 SEM RUSB Lungs: Mildly diminished basilar sounds.  Abdomen: Soft, nontender, mildly distended. No hepatosplenomegaly. No bruits or masses. Good bowel sounds. Extremities: No cyanosis, clubbing, rash, edema Neuro: Alert & oriented x 3, cranial nerves grossly intact. moves all 4 extremities w/o difficulty. Affect pleasant.   Telemetry   Personally reviewed, Sinus bradycardia with PVCs  EKG   Personally reviewed from 06/25/17. Sinus bradycardia  Labs     Basic Metabolic Panel:  Recent Labs Lab 06/25/17 1114  NA 139  K 4.7  CL 103  CO2 25  GLUCOSE 266*  BUN 41*  CREATININE 2.46*  CALCIUM 9.1    Liver Function Tests:  Recent Labs Lab 06/25/17 1114  AST 25  ALT 20  ALKPHOS 42  BILITOT 0.6  PROT 6.5  ALBUMIN 3.2*    Recent Labs Lab 06/25/17 1114  LIPASE 25   No results for input(s): AMMONIA in the last 168 hours.  CBC:  Recent Labs Lab 06/25/17 1114  WBC 4.9  HGB 11.1*  HCT 33.9*  MCV 100.0  PLT 104*    Cardiac Enzymes: No results for input(s): CKTOTAL, CKMB, CKMBINDEX, TROPONINI in the last 168 hours.  BNP: BNP (last 3 results)  Recent Labs   06/25/17 1114  BNP 491.0*    ProBNP (last 3 results) No results for input(s): PROBNP in the last 8760 hours.   CBG: No results for input(s): GLUCAP in the last 168 hours.  Coagulation Studies:  Recent Labs  06/25/17 1114  LABPROT 13.8  INR 1.06    Imaging: Ct Abdomen Pelvis Wo Contrast  Result Date: 06/25/2017 CLINICAL DATA:  Left flank pain, left lateral chest wall pain seen abdominal distension EXAM: CT CHEST, ABDOMEN AND PELVIS WITHOUT CONTRAST TECHNIQUE: Multidetector CT imaging of the chest, abdomen and pelvis was performed following the standard protocol without IV contrast. COMPARISON:  None. FINDINGS: CT CHEST FINDINGS Cardiovascular: No significant vascular findings. Enlarged heart size. Prior CABG. Thoracic aortic atherosclerosis. No pericardial effusion. Mediastinum/Nodes: No enlarged mediastinal, hilar, or axillary lymph nodes. Thyroid gland, trachea, and esophagus demonstrate no significant findings. Lungs/Pleura: 5 mm subpleural right upper lobe pulmonary nodule. Trace right pleural effusion. Calcified left lower pleural plaques. No focal consolidation. No pneumothorax. Musculoskeletal: No acute osseous abnormality. No lytic or sclerotic osseous lesion. CT ABDOMEN PELVIS FINDINGS Hepatobiliary: No focal liver abnormality is seen. Prior right hepatic resection versus focal atrophy. Status post cholecystectomy. No biliary dilatation. Pancreas: Unremarkable. No pancreatic ductal dilatation or surrounding inflammatory changes. Spleen: Normal in size without focal abnormality. Adrenals/Urinary Tract: Normal adrenal glands. Left renal atrophy. Normal right kidney.  5 x 4.2 cm hypodense, fluid attenuating exophytic left renal mass most consistent with a cyst. Normal bladder. Stomach/Bowel: No bowel dilatation or bowel wall thickening. No pneumatosis pneumoperitoneum or portal venous gas. Sigmoid diverticulosis without evidence of diverticulitis. Vascular/Lymphatic: Abdominal aortic  atherosclerosis. Normal caliber abdominal aorta. No lymphadenopathy. Reproductive: Enlarged prostate gland. Other: Fat containing left inguinal hernia containing a small portion of the sigmoid colon. No abdominopelvic ascites. Musculoskeletal: No acute osseous abnormality. No lytic or sclerotic osseous lesion. Cyst bilateral facet arthropathy at L5-S1. IMPRESSION: 1. No acute abnormality of the chest, abdomen or pelvis. 2. Sigmoid diverticulosis without evidence of diverticulitis. 3. Fat containing left inguinal hernia containing a small portion of the sigmoid colon. 4.  Aortic Atherosclerosis (ICD10-170.0) 5. 5 mm subpleural right upper lobe pulmonary nodule. If the patient is at high risk for bronchogenic carcinoma, follow-up chest CT at 6-12 months is recommended. If the patient is at low risk for bronchogenic carcinoma, follow-up chest CT at 12 months is recommended. This recommendation follows the consensus statement: Guidelines for Management of Small Pulmonary Nodules Detected on CT Scans: A Statement from the [employer] as published in Radiology 2005;237:395-400. Electronically Signed   By: Kathreen Devoid   On: 06/25/2017 14:06   Dg Chest 2 View  Result Date: 06/25/2017 CLINICAL DATA:  Left-sided chest pressure today. Shortness of breath. EXAM: CHEST  2 VIEW COMPARISON:  11/19/2015 and 10/22/2015 radiographs. FINDINGS: Stable cardiomegaly and aortic atherosclerosis post CABG and aortic valve replacement. There is lesser definition of the pulmonary vasculature, suspicious for mild pulmonary edema. Mild atelectasis or scarring is present at both lung bases. There is no pneumothorax or significant pleural effusion. Degenerative changes are present throughout the spine. IMPRESSION: Cardiomegaly with suspected mild pulmonary edema. Electronically Signed   By: Richardean Sale M.D.   On: 06/25/2017 11:55   Ct Chest Wo Contrast  Result Date: 06/25/2017 CLINICAL DATA:  Left flank pain, left lateral  chest wall pain seen abdominal distension EXAM: CT CHEST, ABDOMEN AND PELVIS WITHOUT CONTRAST TECHNIQUE: Multidetector CT imaging of the chest, abdomen and pelvis was performed following the standard protocol without IV contrast. COMPARISON:  None. FINDINGS: CT CHEST FINDINGS Cardiovascular: No significant vascular findings. Enlarged heart size. Prior CABG. Thoracic aortic atherosclerosis. No pericardial effusion. Mediastinum/Nodes: No enlarged mediastinal, hilar, or axillary lymph nodes. Thyroid gland, trachea, and esophagus demonstrate no significant findings. Lungs/Pleura: 5 mm subpleural right upper lobe pulmonary nodule. Trace right pleural effusion. Calcified left lower pleural plaques. No focal consolidation. No pneumothorax. Musculoskeletal: No acute osseous abnormality. No lytic or sclerotic osseous lesion. CT ABDOMEN PELVIS FINDINGS Hepatobiliary: No focal liver abnormality is seen. Prior right hepatic resection versus focal atrophy. Status post cholecystectomy. No biliary dilatation. Pancreas: Unremarkable. No pancreatic ductal dilatation or surrounding inflammatory changes. Spleen: Normal in size without focal abnormality. Adrenals/Urinary Tract: Normal adrenal glands. Left renal atrophy. Normal right kidney. 5 x 4.2 cm hypodense, fluid attenuating exophytic left renal mass most consistent with a cyst. Normal bladder. Stomach/Bowel: No bowel dilatation or bowel wall thickening. No pneumatosis pneumoperitoneum or portal venous gas. Sigmoid diverticulosis without evidence of diverticulitis. Vascular/Lymphatic: Abdominal aortic atherosclerosis. Normal caliber abdominal aorta. No lymphadenopathy. Reproductive: Enlarged prostate gland. Other: Fat containing left inguinal hernia containing a small portion of the sigmoid colon. No abdominopelvic ascites. Musculoskeletal: No acute osseous abnormality. No lytic or sclerotic osseous lesion. Cyst bilateral facet arthropathy at L5-S1. IMPRESSION: 1. No acute  abnormality of the chest, abdomen or pelvis. 2. Sigmoid diverticulosis without evidence of diverticulitis. 3. Fat  containing left inguinal hernia containing a small portion of the sigmoid colon. 4.  Aortic Atherosclerosis (ICD10-170.0) 5. 5 mm subpleural right upper lobe pulmonary nodule. If the patient is at high risk for bronchogenic carcinoma, follow-up chest CT at 6-12 months is recommended. If the patient is at low risk for bronchogenic carcinoma, follow-up chest CT at 12 months is recommended. This recommendation follows the consensus statement: Guidelines for Management of Small Pulmonary Nodules Detected on CT Scans: A Statement from the [employer] as published in Radiology 2005;237:395-400. Electronically Signed   By: Kathreen Devoid   On: 06/25/2017 14:06      Patient Profile   [name] Champine is a 83 y.o. male with a PMHx of CAD, CABG, bioprosthetic AVR, anemia, CKD last creatinine 2.2, Chronic systolic heart failure EF 40-45%. He is not on an ACE or ARB due renal failure. Presented to [facility] with SOB and Chest pressure. HF team asked to see for evaluation/admit.    Assessment/Plan   1. Chest pain  - Atypical. Initial troponin negative. Will trend - Cont ASA. - Pt does have history of CAD with CABG.  2. Acute on chronic systolic CHF - NYHA III symptoms at baseline, IIIB today.  - Volume status elevated. Agree with 80 mg IV lasix. BNP only mildly elevated compared to previous. Will follow UO on this dose for tonight, and reassess in am.  - Repeat Echo. Most recent Echo 09/2015 with LVEF 40-45% - No ACE/ARB with CKD III/IV - Continue coreg at current dose. Follow for bradycardia. Pt chronically stable on this dose.  - No spiro or digoxin with CKD. 3. CKD III-IV - Follow closely with diuresis 4. Bioprosthetic AVR - Repeat Echo pending.   Volume overloaded on exam. Will observe overnight at least with IV lasix. Plan repeat Echo. Chest pain atypical, though if EF down, could discuss  utility of LHC with patient.   Shirley Friar, PA-C 06/25/2017, 3:30 PM  Advanced Heart Failure Team Pager [phone] (M-F; 7a - 4p)  Please contact [facility] for night-coverage after hours (4p -7a ) and weekends on [email or web address]   Patient seen with PA, agree with the above note.   He was admitted with dyspnea and pressure in chest/upper abdomen.  CXR with mild pulmonary edema, mild volume overload on exam.  Troponin not elevated.  Creatinine is elevated but at his baseline.  - Lasix 80 mg IV once today, repeat 1 dose tomorrow.  Will likely send home on higher dose of po Lasix.  - Will get echo.   Sinus bradycardia around 50 bpm, will decrease Coreg to 12.5 mg bid.  Increase hydralazine to 37.5 mg bid since BP is mildly elevated.   Cycle troponin.  Chest/abdominal fullness may be because of volume overload.  If troponin remains negative, would not pursue further ischemic workup.  Continue ASA 81.  He is intolerant of statins, will add Zetia 10 mg daily.   Elevated D dimer.  Will get V/Q scan (not candidate for contrast with elevated creatinine).    Loralie Champagne 06/25/2017 4:35 PM

## 2017-06-26 ENCOUNTER — Observation Stay (HOSPITAL_BASED_OUTPATIENT_CLINIC_OR_DEPARTMENT_OTHER): Payer: Medicare Other

## 2017-06-26 ENCOUNTER — Observation Stay (HOSPITAL_COMMUNITY): Payer: Medicare Other

## 2017-06-26 DIAGNOSIS — R001 Bradycardia, unspecified: Secondary | ICD-10-CM

## 2017-06-26 DIAGNOSIS — R0789 Other chest pain: Secondary | ICD-10-CM | POA: Diagnosis not present

## 2017-06-26 DIAGNOSIS — I5023 Acute on chronic systolic (congestive) heart failure: Secondary | ICD-10-CM | POA: Diagnosis not present

## 2017-06-26 DIAGNOSIS — R079 Chest pain, unspecified: Secondary | ICD-10-CM | POA: Diagnosis not present

## 2017-06-26 DIAGNOSIS — R0602 Shortness of breath: Secondary | ICD-10-CM | POA: Diagnosis not present

## 2017-06-26 LAB — ECHOCARDIOGRAM COMPLETE
AV Area VTI: 1.75 cm2
AV Area mean vel: 1.58 cm2
AV Mean grad: 7 mmHg
AV Peak grad: 12 mmHg
AV area mean vel ind: 0.86 cm2/m2
AVAREAVTIIND: 1.01 cm2/m2
AVCELMEANRAT: 0.41
AVPKVEL: 172 cm/s
Ao pk vel: 0.46 m/s
CHL CUP AV PEAK INDEX: 0.95
CHL CUP AV VEL: 1.86
CHL CUP DOP CALC LVOT VTI: 19.9 cm
CHL CUP MV DEC (S): 385
CHL CUP RV SYS PRESS: 13 mmHg
CHL CUP STROKE VOLUME: 47 mL
DOP CAL AO MEAN VELOCITY: 122 cm/s
E/e' ratio: 7.53
EWDT: 385 ms
FS: 15 % — AB (ref 28–44)
HEIGHTINCHES: 69 in
IV/PV OW: 1.11
LA vol A4C: 73.5 ml
LA vol index: 39.3 mL/m2
LA vol: 72.3 mL
LADIAMINDEX: 2.66 cm/m2
LASIZE: 49 mm
LEFT ATRIUM END SYS DIAM: 49 mm
LV E/e' medial: 7.53
LV E/e'average: 7.53
LV SIMPSON'S DISK: 40
LV dias vol index: 63 mL/m2
LV dias vol: 116 mL (ref 62–150)
LV e' LATERAL: 7.62 cm/s
LV sys vol index: 38 mL/m2
LV sys vol: 69 mL — AB (ref 21–61)
LVOT SV: 76 mL
LVOT area: 3.8 cm2
LVOT peak VTI: 0.49 cm
LVOTD: 22 mm
LVOTPV: 79 cm/s
Lateral S' vel: 6.53 cm/s
MVPKAVEL: 102 m/s
MVPKEVEL: 57.4 m/s
PW: 10.1 mm — AB (ref 0.6–1.1)
Reg peak vel: 157 cm/s
TAPSE: 10.1 mm
TDI e' lateral: 7.62
TDI e' medial: 3.7
TR max vel: 157 cm/s
VTI: 40.7 cm
Valve area index: 1.01
Valve area: 1.86 cm2
WEIGHTICAEL: 2440 [oz_av]

## 2017-06-26 LAB — COMPREHENSIVE METABOLIC PANEL
ALBUMIN: 3.2 g/dL — AB (ref 3.5–5.0)
ALK PHOS: 36 U/L — AB (ref 38–126)
ALT: 19 U/L (ref 17–63)
AST: 22 U/L (ref 15–41)
Anion gap: 7 (ref 5–15)
BILIRUBIN TOTAL: 0.7 mg/dL (ref 0.3–1.2)
BUN: 42 mg/dL — AB (ref 6–20)
CALCIUM: 9 mg/dL (ref 8.9–10.3)
CO2: 30 mmol/L (ref 22–32)
CREATININE: 2.59 mg/dL — AB (ref 0.61–1.24)
Chloride: 101 mmol/L (ref 101–111)
GFR calc Af Amer: 24 mL/min — ABNORMAL LOW (ref >60)
GFR, EST NON AFRICAN AMERICAN: 20 mL/min — AB (ref >60)
Glucose, Bld: 151 mg/dL — ABNORMAL HIGH (ref 65–99)
Potassium: 4.1 mmol/L (ref 3.5–5.1)
Sodium: 138 mmol/L (ref 135–145)
TOTAL PROTEIN: 6.1 g/dL — AB (ref 6.5–8.1)

## 2017-06-26 LAB — GLUCOSE, CAPILLARY
GLUCOSE-CAPILLARY: 110 mg/dL — AB (ref 65–99)
Glucose-Capillary: 159 mg/dL — ABNORMAL HIGH (ref 65–99)
Glucose-Capillary: 105 mg/dL — ABNORMAL HIGH (ref 65–99)

## 2017-06-26 LAB — TROPONIN I: Troponin I: 0.03 ng/mL (ref <0.03)

## 2017-06-26 MED ORDER — ASPIRIN 81 MG PO TBEC: 81.0000 mg | DELAYED_RELEASE_TABLET | Freq: Every day | ORAL | 6 refills | Status: AC

## 2017-06-26 MED ORDER — HYDRALAZINE HCL 50 MG PO TABS
50.0000 mg | ORAL_TABLET | Freq: Three times a day (TID) | ORAL | Status: DC
  Administered 2017-06-26: 50 mg via ORAL
  Filled 2017-06-26: qty 1

## 2017-06-26 MED ORDER — FUROSEMIDE 40 MG PO TABS: 60.0000 mg | ORAL_TABLET | Freq: Two times a day (BID) | ORAL | 6 refills | Status: DC

## 2017-06-26 MED ORDER — TECHNETIUM TC 99M DIETHYLENETRIAME-PENTAACETIC ACID: 30.0000 | Freq: Once | INTRAVENOUS | Status: DC | PRN

## 2017-06-26 MED ORDER — EZETIMIBE 10 MG PO TABS: 10.0000 mg | ORAL_TABLET | Freq: Every day | ORAL | 6 refills | Status: DC

## 2017-06-26 MED ORDER — CARVEDILOL 6.25 MG PO TABS
6.2500 mg | ORAL_TABLET | Freq: Two times a day (BID) | ORAL | Status: DC
  Administered 2017-06-26: 6.25 mg via ORAL
  Filled 2017-06-26: qty 1

## 2017-06-26 MED ORDER — HYDRALAZINE HCL 50 MG PO TABS
50.0000 mg | ORAL_TABLET | Freq: Three times a day (TID) | ORAL | 6 refills | Status: DC
Start: 2017-06-26 — End: 2018-02-13

## 2017-06-26 MED ORDER — ASPIRIN EC 81 MG PO TBEC
81.0000 mg | DELAYED_RELEASE_TABLET | Freq: Every day | ORAL | Status: DC
  Administered 2017-06-26: 81 mg via ORAL
  Filled 2017-06-26: qty 1

## 2017-06-26 MED ORDER — FUROSEMIDE 10 MG/ML IJ SOLN
40.0000 mg | Freq: Once | INTRAMUSCULAR | Status: AC
  Administered 2017-06-26: 40 mg via INTRAVENOUS
  Filled 2017-06-26: qty 4

## 2017-06-26 MED ORDER — TECHNETIUM TO 99M ALBUMIN AGGREGATED
4.1000 | Freq: Once | INTRAVENOUS | Status: AC | PRN
  Administered 2017-06-26: 4.1 via INTRAVENOUS

## 2017-06-26 MED ORDER — CARVEDILOL 12.5 MG PO TABS: 12.5000 mg | ORAL_TABLET | Freq: Two times a day (BID) | ORAL | 6 refills | Status: DC

## 2017-06-26 NOTE — Progress Notes (Signed)
Pt has orders to be discharged. Discharge instructions given and pt has no additional questions at this time. Medication regimen reviewed and pt educated. Pt verbalized understanding and has no additional questions. Telemetry box removed. IV removed and site in good condition. Pt stable and waiting for transportation. 

## 2017-06-26 NOTE — Discharge Summary (Signed)
Advanced Heart Failure Discharge Note  Discharge Summary   Patient ID: [name] [name] MRN: [id number], DOB/AGE: 07/06/1926 83 y.o. Admit date: 06/25/2017 D/C date:     06/26/2017   Primary Discharge Diagnoses:  1. Chest pain  2. CAD s/p CABG 3. Acute on chronic systolic CHF 4. CKD III-IV 5. Bioprosthetic AVR  Hospital Course:   Mr [name] [name] is a 83 y.o. male with a PMHx of CAD, CABG, bioprosthetic AVR, anemia, CKD last creatinine 2.2, Chronic systolic heart failure EF 40-45%. He is not on an ACE or ARB due renal failure. Presented to [facility] with SOB and Chest pressure. HF team asked to see for evaluation/admit.   Diuresed with 2 doses of IV lasix with rapid symptom improvement. With + D-Dimer, VQ scan ordered, which was negative as below.   Morning of 06/26/17 pt felt much better. With rapid improvement and negative work up for PE, patient thought stable for discharge home on increased lasix dose as below, with close follow up in HF clinic.  Echo did show EF decreased from previous Echo, but chest pain thought to be from volume overload, so no ischemic work up pursued at this time.   Imaging  CT chest WO contrast 06/25/17 unremarkable about from 5 mm RUL pulmonary nodule. 6-12 month follow up CT recommended.   Echo 06/26/17 LVEF 25-30%, Grade 1 DD, Bioprosthetic AV mean gradient 7 mm Hg  VQ scan 06/26/17 stable. No evidence of PE.    Discharge Weight Range: 152 lbs.  Discharge Vitals: Blood pressure (!) 158/68, pulse 68, temperature (!) 97 F (36.1 C), temperature source Oral, resp. rate 18, height 5\' 9"  (1.753 m), weight 152 lb 8 oz (69.2 kg), SpO2 98 %.  Labs: Lab Results  Component Value Date   WBC 4.9 06/25/2017   HGB 11.1 (L) 06/25/2017   HCT 33.9 (L) 06/25/2017   MCV 100.0 06/25/2017   PLT 104 (L) 06/25/2017     Recent Labs Lab 06/26/17 0056  NA 138  K 4.1  CL 101  CO2 30  BUN 42*  CREATININE 2.59*  CALCIUM 9.0  PROT 6.1*  BILITOT 0.7  ALKPHOS 36*  ALT 19    AST 22  GLUCOSE 151*   Lab Results  Component Value Date   CHOL 166 03/13/2017   HDL 33 (L) 03/13/2017   LDLCALC 96 03/13/2017   TRIG 183 (H) 03/13/2017   BNP (last 3 results)  Recent Labs  06/25/17 1114  BNP 491.0*    ProBNP (last 3 results) No results for input(s): PROBNP in the last 8760 hours.   Diagnostic Studies/Procedures   Ct Abdomen Pelvis Wo Contrast  Result Date: 06/25/2017 CLINICAL DATA:  Left flank pain, left lateral chest wall pain seen abdominal distension EXAM: CT CHEST, ABDOMEN AND PELVIS WITHOUT CONTRAST TECHNIQUE: Multidetector CT imaging of the chest, abdomen and pelvis was performed following the standard protocol without IV contrast. COMPARISON:  None. FINDINGS: CT CHEST FINDINGS Cardiovascular: No significant vascular findings. Enlarged heart size. Prior CABG. Thoracic aortic atherosclerosis. No pericardial effusion. Mediastinum/Nodes: No enlarged mediastinal, hilar, or axillary lymph nodes. Thyroid gland, trachea, and esophagus demonstrate no significant findings. Lungs/Pleura: 5 mm subpleural right upper lobe pulmonary nodule. Trace right pleural effusion. Calcified left lower pleural plaques. No focal consolidation. No pneumothorax. Musculoskeletal: No acute osseous abnormality. No lytic or sclerotic osseous lesion. CT ABDOMEN PELVIS FINDINGS Hepatobiliary: No focal liver abnormality is seen. Prior right hepatic resection versus focal atrophy. Status post cholecystectomy. No biliary dilatation. Pancreas: Unremarkable. No pancreatic  ductal dilatation or surrounding inflammatory changes. Spleen: Normal in size without focal abnormality. Adrenals/Urinary Tract: Normal adrenal glands. Left renal atrophy. Normal right kidney. 5 x 4.2 cm hypodense, fluid attenuating exophytic left renal mass most consistent with a cyst. Normal bladder. Stomach/Bowel: No bowel dilatation or bowel wall thickening. No pneumatosis pneumoperitoneum or portal venous gas. Sigmoid  diverticulosis without evidence of diverticulitis. Vascular/Lymphatic: Abdominal aortic atherosclerosis. Normal caliber abdominal aorta. No lymphadenopathy. Reproductive: Enlarged prostate gland. Other: Fat containing left inguinal hernia containing a small portion of the sigmoid colon. No abdominopelvic ascites. Musculoskeletal: No acute osseous abnormality. No lytic or sclerotic osseous lesion. Cyst bilateral facet arthropathy at L5-S1. IMPRESSION: 1. No acute abnormality of the chest, abdomen or pelvis. 2. Sigmoid diverticulosis without evidence of diverticulitis. 3. Fat containing left inguinal hernia containing a small portion of the sigmoid colon. 4.  Aortic Atherosclerosis (ICD10-170.0) 5. 5 mm subpleural right upper lobe pulmonary nodule. If the patient is at high risk for bronchogenic carcinoma, follow-up chest CT at 6-12 months is recommended. If the patient is at low risk for bronchogenic carcinoma, follow-up chest CT at 12 months is recommended. This recommendation follows the consensus statement: Guidelines for Management of Small Pulmonary Nodules Detected on CT Scans: A Statement from the [place] as published in Radiology 2005;237:395-400. Electronically Signed   By: Kathreen Devoid   On: 06/25/2017 14:06   Dg Chest 1 View  Result Date: 06/26/2017 CLINICAL DATA:  Chest pain and shortness of breath. History of pleural effusion, congestive heart failure and hypertension. History of colon cancer. EXAM: CHEST 1 VIEW COMPARISON:  CT 06/25/2017.  Chest radiographs 06/25/2017. FINDINGS: The heart size and mediastinal contours are stable status post CABG and aortic valve replacement. There is stable pleural thickening on the left. Basilar aeration has improved from yesterday. There is no confluent airspace opacity, pneumothorax or significant pleural effusion. The bones appear unchanged. Old rib fracture noted on the left. IMPRESSION: No acute cardiopulmonary process. Atherosclerosis and left  pleural thickening post CABG. Electronically Signed   By: Richardean Sale M.D.   On: 06/26/2017 14:36   Dg Chest 2 View  Result Date: 06/25/2017 CLINICAL DATA:  Left-sided chest pressure today. Shortness of breath. EXAM: CHEST  2 VIEW COMPARISON:  11/19/2015 and 10/22/2015 radiographs. FINDINGS: Stable cardiomegaly and aortic atherosclerosis post CABG and aortic valve replacement. There is lesser definition of the pulmonary vasculature, suspicious for mild pulmonary edema. Mild atelectasis or scarring is present at both lung bases. There is no pneumothorax or significant pleural effusion. Degenerative changes are present throughout the spine. IMPRESSION: Cardiomegaly with suspected mild pulmonary edema. Electronically Signed   By: Richardean Sale M.D.   On: 06/25/2017 11:55   Ct Chest Wo Contrast  Result Date: 06/25/2017 CLINICAL DATA:  Left flank pain, left lateral chest wall pain seen abdominal distension EXAM: CT CHEST, ABDOMEN AND PELVIS WITHOUT CONTRAST TECHNIQUE: Multidetector CT imaging of the chest, abdomen and pelvis was performed following the standard protocol without IV contrast. COMPARISON:  None. FINDINGS: CT CHEST FINDINGS Cardiovascular: No significant vascular findings. Enlarged heart size. Prior CABG. Thoracic aortic atherosclerosis. No pericardial effusion. Mediastinum/Nodes: No enlarged mediastinal, hilar, or axillary lymph nodes. Thyroid gland, trachea, and esophagus demonstrate no significant findings. Lungs/Pleura: 5 mm subpleural right upper lobe pulmonary nodule. Trace right pleural effusion. Calcified left lower pleural plaques. No focal consolidation. No pneumothorax. Musculoskeletal: No acute osseous abnormality. No lytic or sclerotic osseous lesion. CT ABDOMEN PELVIS FINDINGS Hepatobiliary: No focal liver abnormality is seen. Prior right hepatic  resection versus focal atrophy. Status post cholecystectomy. No biliary dilatation. Pancreas: Unremarkable. No pancreatic ductal  dilatation or surrounding inflammatory changes. Spleen: Normal in size without focal abnormality. Adrenals/Urinary Tract: Normal adrenal glands. Left renal atrophy. Normal right kidney. 5 x 4.2 cm hypodense, fluid attenuating exophytic left renal mass most consistent with a cyst. Normal bladder. Stomach/Bowel: No bowel dilatation or bowel wall thickening. No pneumatosis pneumoperitoneum or portal venous gas. Sigmoid diverticulosis without evidence of diverticulitis. Vascular/Lymphatic: Abdominal aortic atherosclerosis. Normal caliber abdominal aorta. No lymphadenopathy. Reproductive: Enlarged prostate gland. Other: Fat containing left inguinal hernia containing a small portion of the sigmoid colon. No abdominopelvic ascites. Musculoskeletal: No acute osseous abnormality. No lytic or sclerotic osseous lesion. Cyst bilateral facet arthropathy at L5-S1. IMPRESSION: 1. No acute abnormality of the chest, abdomen or pelvis. 2. Sigmoid diverticulosis without evidence of diverticulitis. 3. Fat containing left inguinal hernia containing a small portion of the sigmoid colon. 4.  Aortic Atherosclerosis (ICD10-170.0) 5. 5 mm subpleural right upper lobe pulmonary nodule. If the patient is at high risk for bronchogenic carcinoma, follow-up chest CT at 6-12 months is recommended. If the patient is at low risk for bronchogenic carcinoma, follow-up chest CT at 12 months is recommended. This recommendation follows the consensus statement: Guidelines for Management of Small Pulmonary Nodules Detected on CT Scans: A Statement from the [place] as published in Radiology 2005;237:395-400. Electronically Signed   By: Kathreen Devoid   On: 06/25/2017 14:06   Nm Pulmonary Perf And Vent  Result Date: 06/26/2017 CLINICAL DATA:  Chest pain. History of pleural effusion, congestive heart failure and hypertension. History of colon cancer. EXAM: NUCLEAR MEDICINE VENTILATION - PERFUSION LUNG SCAN TECHNIQUE: Ventilation images were  obtained in multiple projections using inhaled aerosol Tc-18m DTPA. Perfusion images were obtained in multiple projections after intravenous injection of Tc-10m MAA. RADIOPHARMACEUTICALS:  30.0 mCi Technetium-63m DTPA aerosol inhalation and 4.0 mCi Technetium-8m MAA IV COMPARISON:  Nuclear medicine pulmonary ventilation and perfusion scan 10/23/2015. Noncontrast chest CT 06/25/2017. FINDINGS: Ventilation: No focal ventilation defect. Mild central clumping of the aerosol and free pertechnetate in the stomach noted. Perfusion: No wedge shaped peripheral perfusion defects to suggest acute pulmonary embolism. The perfusion is unchanged from the prior study. IMPRESSION: Stable examination.  No evidence of pulmonary embolism. Electronically Signed   By: Richardean Sale M.D.   On: 06/26/2017 14:33    Discharge Medications   Allergies as of 06/26/2017      Reactions   Statins Other (See Comments)   Pain/weakness in legs      Medication List    STOP taking these medications   cholecalciferol 1000 units tablet Commonly known as:  VITAMIN D     TAKE these medications   aspirin 81 MG EC tablet Take 1 tablet (81 mg total) by mouth daily. What changed:  medication strength  how much to take   carvedilol 12.5 MG tablet Commonly known as:  COREG Take 1 tablet (12.5 mg total) by mouth 2 (two) times daily with a meal. What changed:  medication strength  how much to take   ezetimibe 10 MG tablet Commonly known as:  ZETIA Take 1 tablet (10 mg total) by mouth at bedtime.   freestyle lancets Use one lancet in morning before breakfast to check blood sugar.  Dx E11.8   FREESTYLE LITE test strip Generic drug:  glucose blood USE TO TEST BLOOD SUGARS ONCE DAILY   furosemide 40 MG tablet Commonly known as:  LASIX Take 1.5 tablets (60 mg total)  by mouth 2 (two) times daily. What changed:  how much to take  when to take this   glimepiride 1 MG tablet Commonly known as:  AMARYL TAKE 1  TABLET BY MOUTH DAILY WITH BREAKFAST   hydrALAZINE 50 MG tablet Commonly known as:  APRESOLINE Take 1 tablet (50 mg total) by mouth 3 (three) times daily. What changed:  medication strength  See the new instructions.   isosorbide mononitrate 60 MG 24 hr tablet Commonly known as:  IMDUR TAKE 1 TABLET BY MOUTH EVERY DAY   multivitamin with minerals Tabs tablet Take 1 tablet by mouth daily.   nitroGLYCERIN 0.4 MG SL tablet Commonly known as:  NITROSTAT Place 0.4 mg under the tongue every 5 (five) minutes as needed for chest pain. As needed   omeprazole 20 MG capsule Commonly known as:  PRILOSEC Take 20 mg by mouth daily.   tamsulosin 0.4 MG Caps capsule Commonly known as:  FLOMAX TAKE 1 CAPSULE BY MOUTH EVERY DAY AFTER DINNER   Travoprost (BAK Free) 0.004 % Soln ophthalmic solution Commonly known as:  TRAVATAN Place 1 drop into both eyes at bedtime.       Disposition   The patient will be discharged in stable condition to home. Discharge Instructions    (HEART FAILURE PATIENTS) Call MD:  Anytime you have any of the following symptoms: 1) 3 pound weight gain in 24 hours or 5 pounds in 1 week 2) shortness of breath, with or without a dry hacking cough 3) swelling in the hands, feet or stomach 4) if you have to sleep on extra pillows at night in order to breathe.    Complete by:  As directed    Diet - low sodium heart healthy    Complete by:  As directed    Increase activity slowly    Complete by:  As directed    STOP any activity that causes chest pain, shortness of breath, dizziness, sweating, or exessive weakness    Complete by:  As directed      Follow-up Information    Larey Dresser, MD Follow up on 07/06/2017.   Specialty:  Cardiology Why:  at 1040 am for post hospital follow up.  Please bring all of your medications to your visit. The code for parking is 7000. Contact information: [address] [place] [address] [phone]              Duration of Discharge Encounter: Greater than 35 minutes   Signed, Annamaria Helling 06/26/2017, 5:34 PM\

## 2017-06-26 NOTE — Progress Notes (Signed)
Advanced Heart Failure Rounding Note   Primary Cardiologist: Dr. Aundra Dubin   Subjective:    Feeling " a hundred times better" today after diuresis. Denies chest pressure. No SOB. Denies lightheadedness or dizziness.  Echo pending (today per echo tech) VQ scan pending with + D-Dimer.  Weight down 2 lbs. Negative 700 cc yesterday.  Objective:   Weight Range: 152 lb 8 oz (69.2 kg) Body mass index is 22.52 kg/m.   Vital Signs:   Temp:  [97.8 F (36.6 C)-98.4 F (36.9 C)] 98 F (36.7 C) (07/17 0730) Pulse Rate:  [40-62] 62 (07/17 0730) Resp:  [15-35] 18 (07/17 0730) BP: (119-174)/(45-79) 156/76 (07/17 0730) SpO2:  [95 %-100 %] 98 % (07/17 0730) Weight:  [149 lb (67.6 kg)-154 lb (69.9 kg)] 152 lb 8 oz (69.2 kg) (07/17 0506) Last BM Date: 06/25/17  Weight change: Filed Weights   06/25/17 1109 06/25/17 2037 06/26/17 0506  Weight: 149 lb (67.6 kg) 154 lb (69.9 kg) 152 lb 8 oz (69.2 kg)    Intake/Output:   Intake/Output Summary (Last 24 hours) at 06/26/17 0943 Last data filed at 06/26/17 0852  Gross per 24 hour  Intake              120 ml  Output              700 ml  Net             -580 ml      Physical Exam    General:  Elderly appearing. No resp difficulty HEENT: Normal Neck: Supple. JVP 8-9. Carotids 2+ bilat; no bruits. No lymphadenopathy or thyromegaly appreciated. Cor: PMI nondisplaced. Regular rate & rhythm. No rubs, gallops or murmurs. Lungs: Clear Abdomen: Soft, nontender, nondistended. No hepatosplenomegaly. No bruits or masses. Good bowel sounds. Extremities: No cyanosis, clubbing, rash, edema Neuro: Alert & orientedx3, cranial nerves grossly intact. moves all 4 extremities w/o difficulty. Affect pleasant   Telemetry   Personally reviewed, NSR/Sinus brady 50-60s  EKG    From 06/25/17 previously reviewed. Sinus brady.  Labs    CBC  Recent Labs  06/25/17 1114  WBC 4.9  HGB 11.1*  HCT 33.9*  MCV 100.0  PLT 875*   Basic Metabolic  Panel  Recent Labs  06/25/17 1114 06/26/17 0056  NA 139 138  K 4.7 4.1  CL 103 101  CO2 25 30  GLUCOSE 266* 151*  BUN 41* 42*  CREATININE 2.46* 2.59*  CALCIUM 9.1 9.0   Liver Function Tests  Recent Labs  06/25/17 1114 06/26/17 0056  AST 25 22  ALT 20 19  ALKPHOS 42 36*  BILITOT 0.6 0.7  PROT 6.5 6.1*  ALBUMIN 3.2* 3.2*    Recent Labs  06/25/17 1114  LIPASE 25   Cardiac Enzymes  Recent Labs  06/25/17 1817 06/26/17 0056  TROPONINI 0.03* 0.03*    BNP: BNP (last 3 results)  Recent Labs  06/25/17 1114  BNP 491.0*    ProBNP (last 3 results) No results for input(s): PROBNP in the last 8760 hours.   D-Dimer  Recent Labs  06/25/17 1114  DDIMER 0.98*   Hemoglobin A1C No results for input(s): HGBA1C in the last 72 hours. Fasting Lipid Panel No results for input(s): CHOL, HDL, LDLCALC, TRIG, CHOLHDL, LDLDIRECT in the last 72 hours. Thyroid Function Tests No results for input(s): TSH, T4TOTAL, T3FREE, THYROIDAB in the last 72 hours.  Invalid input(s): FREET3  Other results:   Imaging    Ct Abdomen Pelvis  Wo Contrast  Result Date: 06/25/2017 CLINICAL DATA:  Left flank pain, left lateral chest wall pain seen abdominal distension EXAM: CT CHEST, ABDOMEN AND PELVIS WITHOUT CONTRAST TECHNIQUE: Multidetector CT imaging of the chest, abdomen and pelvis was performed following the standard protocol without IV contrast. COMPARISON:  None. FINDINGS: CT CHEST FINDINGS Cardiovascular: No significant vascular findings. Enlarged heart size. Prior CABG. Thoracic aortic atherosclerosis. No pericardial effusion. Mediastinum/Nodes: No enlarged mediastinal, hilar, or axillary lymph nodes. Thyroid gland, trachea, and esophagus demonstrate no significant findings. Lungs/Pleura: 5 mm subpleural right upper lobe pulmonary nodule. Trace right pleural effusion. Calcified left lower pleural plaques. No focal consolidation. No pneumothorax. Musculoskeletal: No acute osseous  abnormality. No lytic or sclerotic osseous lesion. CT ABDOMEN PELVIS FINDINGS Hepatobiliary: No focal liver abnormality is seen. Prior right hepatic resection versus focal atrophy. Status post cholecystectomy. No biliary dilatation. Pancreas: Unremarkable. No pancreatic ductal dilatation or surrounding inflammatory changes. Spleen: Normal in size without focal abnormality. Adrenals/Urinary Tract: Normal adrenal glands. Left renal atrophy. Normal right kidney. 5 x 4.2 cm hypodense, fluid attenuating exophytic left renal mass most consistent with a cyst. Normal bladder. Stomach/Bowel: No bowel dilatation or bowel wall thickening. No pneumatosis pneumoperitoneum or portal venous gas. Sigmoid diverticulosis without evidence of diverticulitis. Vascular/Lymphatic: Abdominal aortic atherosclerosis. Normal caliber abdominal aorta. No lymphadenopathy. Reproductive: Enlarged prostate gland. Other: Fat containing left inguinal hernia containing a small portion of the sigmoid colon. No abdominopelvic ascites. Musculoskeletal: No acute osseous abnormality. No lytic or sclerotic osseous lesion. Cyst bilateral facet arthropathy at L5-S1. IMPRESSION: 1. No acute abnormality of the chest, abdomen or pelvis. 2. Sigmoid diverticulosis without evidence of diverticulitis. 3. Fat containing left inguinal hernia containing a small portion of the sigmoid colon. 4.  Aortic Atherosclerosis (ICD10-170.0) 5. 5 mm subpleural right upper lobe pulmonary nodule. If the patient is at high risk for bronchogenic carcinoma, follow-up chest CT at 6-12 months is recommended. If the patient is at low risk for bronchogenic carcinoma, follow-up chest CT at 12 months is recommended. This recommendation follows the consensus statement: Guidelines for Management of Small Pulmonary Nodules Detected on CT Scans: A Statement from the [place] as published in Radiology 2005;237:395-400. Electronically Signed   By: Kathreen Devoid   On: 06/25/2017 14:06    Dg Chest 2 View  Result Date: 06/25/2017 CLINICAL DATA:  Left-sided chest pressure today. Shortness of breath. EXAM: CHEST  2 VIEW COMPARISON:  11/19/2015 and 10/22/2015 radiographs. FINDINGS: Stable cardiomegaly and aortic atherosclerosis post CABG and aortic valve replacement. There is lesser definition of the pulmonary vasculature, suspicious for mild pulmonary edema. Mild atelectasis or scarring is present at both lung bases. There is no pneumothorax or significant pleural effusion. Degenerative changes are present throughout the spine. IMPRESSION: Cardiomegaly with suspected mild pulmonary edema. Electronically Signed   By: Richardean Sale M.D.   On: 06/25/2017 11:55   Ct Chest Wo Contrast  Result Date: 06/25/2017 CLINICAL DATA:  Left flank pain, left lateral chest wall pain seen abdominal distension EXAM: CT CHEST, ABDOMEN AND PELVIS WITHOUT CONTRAST TECHNIQUE: Multidetector CT imaging of the chest, abdomen and pelvis was performed following the standard protocol without IV contrast. COMPARISON:  None. FINDINGS: CT CHEST FINDINGS Cardiovascular: No significant vascular findings. Enlarged heart size. Prior CABG. Thoracic aortic atherosclerosis. No pericardial effusion. Mediastinum/Nodes: No enlarged mediastinal, hilar, or axillary lymph nodes. Thyroid gland, trachea, and esophagus demonstrate no significant findings. Lungs/Pleura: 5 mm subpleural right upper lobe pulmonary nodule. Trace right pleural effusion. Calcified left lower pleural plaques. No focal  consolidation. No pneumothorax. Musculoskeletal: No acute osseous abnormality. No lytic or sclerotic osseous lesion. CT ABDOMEN PELVIS FINDINGS Hepatobiliary: No focal liver abnormality is seen. Prior right hepatic resection versus focal atrophy. Status post cholecystectomy. No biliary dilatation. Pancreas: Unremarkable. No pancreatic ductal dilatation or surrounding inflammatory changes. Spleen: Normal in size without focal abnormality.  Adrenals/Urinary Tract: Normal adrenal glands. Left renal atrophy. Normal right kidney. 5 x 4.2 cm hypodense, fluid attenuating exophytic left renal mass most consistent with a cyst. Normal bladder. Stomach/Bowel: No bowel dilatation or bowel wall thickening. No pneumatosis pneumoperitoneum or portal venous gas. Sigmoid diverticulosis without evidence of diverticulitis. Vascular/Lymphatic: Abdominal aortic atherosclerosis. Normal caliber abdominal aorta. No lymphadenopathy. Reproductive: Enlarged prostate gland. Other: Fat containing left inguinal hernia containing a small portion of the sigmoid colon. No abdominopelvic ascites. Musculoskeletal: No acute osseous abnormality. No lytic or sclerotic osseous lesion. Cyst bilateral facet arthropathy at L5-S1. IMPRESSION: 1. No acute abnormality of the chest, abdomen or pelvis. 2. Sigmoid diverticulosis without evidence of diverticulitis. 3. Fat containing left inguinal hernia containing a small portion of the sigmoid colon. 4.  Aortic Atherosclerosis (ICD10-170.0) 5. 5 mm subpleural right upper lobe pulmonary nodule. If the patient is at high risk for bronchogenic carcinoma, follow-up chest CT at 6-12 months is recommended. If the patient is at low risk for bronchogenic carcinoma, follow-up chest CT at 12 months is recommended. This recommendation follows the consensus statement: Guidelines for Management of Small Pulmonary Nodules Detected on CT Scans: A Statement from the [place] as published in Radiology 2005;237:395-400. Electronically Signed   By: Kathreen Devoid   On: 06/25/2017 14:06      Medications:     Scheduled Medications: . aspirin EC  325 mg Oral Daily  . carvedilol  6.25 mg Oral BID WC  . cholecalciferol  1,000 Units Oral Q supper  . enoxaparin (LOVENOX) injection  30 mg Subcutaneous Q24H  . ezetimibe  10 mg Oral QHS  . furosemide  40 mg Intravenous Once  . glimepiride  1 mg Oral Q breakfast  . hydrALAZINE  37.5 mg Oral TID  .  insulin aspart  0-9 Units Subcutaneous TID WC  . isosorbide mononitrate  60 mg Oral Daily  . latanoprost  1 drop Both Eyes QHS  . multivitamin with minerals  1 tablet Oral Daily  . pantoprazole  40 mg Oral Daily  . sodium chloride flush  3 mL Intravenous Q12H  . tamsulosin  0.4 mg Oral QPC supper     Infusions: . sodium chloride       PRN Medications:  sodium chloride, acetaminophen, nitroGLYCERIN, ondansetron (ZOFRAN) IV, sodium chloride flush    Patient Profile   [name] [name] is a 83 y.o. male with a PMHx of CAD, CABG, bioprosthetic AVR, anemia, CKD last creatinine 2.2, Chronic systolic heart failure EF 40-45%. He is not on an ACE or ARB due renal failure. Presented to [facility] with SOB and Chest pressure. HF team asked to see for evaluation/admit.   Assessment/Plan   1. Chest pain  - Atypical. Troponin negative. No further ischemic work up. Suspect related to volume overload.  - Cont ASA. 2. CAD s/p CABG - Chest pain atypical - D-dimer positive but low suspicion. VQ scan pending.  - Intolerant to statins. Will try on Zetia.  3. Acute on chronic systolic CHF - NYHA III symptoms at baseline.  - Volume status improved. Will repeat dose of IV lasix today (40 mg) with mild rise in creatinine. - Pt was on lasix  40 mg BID at home. Will plan to send out on 60 mg BID.  - Repeat Echo pending. Most recent Echo 09/2015 with LVEF 40-45% - Increase hydralazine to 50 mg TID. His BPs tend to run higher in health care settings, so will need to follow for hypotension at home.  - No ACE/ARB with CKD III/IV - Continue coreg 12.5 mg BID.   - No spiro or digoxin with CKD. 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 Follow closely with diuresis 5. Bioprosthetic AVR - Repeat Echo pending.   Pt much improved. Give one more dose IV lasix.   Can go home after Echo, so long as VQ scan normal.   Will make HF follow up for next week.   Length of Stay: 0  Annamaria Helling  06/26/2017,  9:43 AM  Advanced Heart Failure Team Pager [phone] (M-F; 7a - 4p)  Please contact [facility] for night-coverage after hours (4p -7a ) and weekends on [email or web address]  Patient seen with PA, agree with the above note.   He is doing better today after 2 doses of Lasix 80 mg IV.  Weight is down 2 lbs.  He says dyspnea has resolved.  Volume status looks better on exam.    Echo with E 25-30%, normally functioning bioprosthetic aortic valve.  V/Q scan negative for PE (elevated D dimer).   I think that he can go home today.  We will schedule close followup.  He will increase Lasix to 60 mg po bid.  He will decrease Coreg to 12.5 mg bid given bradycardia.  We will increase hydralazine to 50 mg tid and continue Imdur.   Creatinine has remained stable.   [name] [name] 06/26/2017 4:44 PM

## 2017-06-26 NOTE — Progress Notes (Signed)
  Echocardiogram 2D Echocardiogram has been performed.  Johny Chess 06/26/2017, 3:25 PM

## 2017-06-29 ENCOUNTER — Telehealth: Payer: Self-pay

## 2017-06-29 NOTE — Telephone Encounter (Signed)
Transition Care Management Follow-Up Telephone Call   Date discharged and where: [facility] on 06/26/17  How have you been since you were released from the hospital? Pretty good  Any patient concerns? "not a one"  Items Reviewed:   Meds: Y  Allergies: Y  Dietary Changes Reviewed:Y  Functional Questionnaire:  Independent-I Dependent-D  ADLs:   Dressing- I w/ assist    Eating- I   Maintaining continence- I   Transferring- I   Transportation- I   Meal Prep- D   Managing Meds- D  Confirmed importance and Date/Time of follow-up visits scheduled: Yes, pt needs wife to make his appointment. Left name and number for wife to call back at.   Confirmed with patient if condition worsens to call PCP or go to the Emergency Dept. Patient was given office number and encouraged to call back with questions or concerns: Yes

## 2017-07-05 ENCOUNTER — Inpatient Hospital Stay (HOSPITAL_COMMUNITY)
Admission: EM | Admit: 2017-07-05 | Discharge: 2017-07-08 | DRG: 291 | Disposition: A | Discharge status: Home / Self Care | Payer: Medicare Other | Attending: Cardiology | Admitting: Cardiology

## 2017-07-05 ENCOUNTER — Emergency Department (HOSPITAL_COMMUNITY): Payer: Medicare Other

## 2017-07-05 ENCOUNTER — Encounter (HOSPITAL_COMMUNITY): Payer: Self-pay

## 2017-07-05 ENCOUNTER — Other Ambulatory Visit: Payer: Self-pay

## 2017-07-05 DIAGNOSIS — N184 Chronic kidney disease, stage 4 (severe): Secondary | ICD-10-CM | POA: Diagnosis not present

## 2017-07-05 DIAGNOSIS — Z951 Presence of aortocoronary bypass graft: Secondary | ICD-10-CM | POA: Diagnosis not present

## 2017-07-05 DIAGNOSIS — K219 Gastro-esophageal reflux disease without esophagitis: Secondary | ICD-10-CM | POA: Diagnosis present

## 2017-07-05 DIAGNOSIS — Z952 Presence of prosthetic heart valve: Secondary | ICD-10-CM

## 2017-07-05 DIAGNOSIS — F410 Panic disorder [episodic paroxysmal anxiety] without agoraphobia: Secondary | ICD-10-CM | POA: Diagnosis present

## 2017-07-05 DIAGNOSIS — I5043 Acute on chronic combined systolic (congestive) and diastolic (congestive) heart failure: Secondary | ICD-10-CM | POA: Diagnosis not present

## 2017-07-05 DIAGNOSIS — Z8249 Family history of ischemic heart disease and other diseases of the circulatory system: Secondary | ICD-10-CM

## 2017-07-05 DIAGNOSIS — Z7984 Long term (current) use of oral hypoglycemic drugs: Secondary | ICD-10-CM

## 2017-07-05 DIAGNOSIS — Z9109 Other allergy status, other than to drugs and biological substances: Secondary | ICD-10-CM

## 2017-07-05 DIAGNOSIS — R079 Chest pain, unspecified: Secondary | ICD-10-CM | POA: Diagnosis not present

## 2017-07-05 DIAGNOSIS — E1122 Type 2 diabetes mellitus with diabetic chronic kidney disease: Secondary | ICD-10-CM | POA: Diagnosis present

## 2017-07-05 DIAGNOSIS — Z953 Presence of xenogenic heart valve: Secondary | ICD-10-CM | POA: Diagnosis not present

## 2017-07-05 DIAGNOSIS — I130 Hypertensive heart and chronic kidney disease with heart failure and stage 1 through stage 4 chronic kidney disease, or unspecified chronic kidney disease: Principal | ICD-10-CM | POA: Diagnosis present

## 2017-07-05 DIAGNOSIS — I252 Old myocardial infarction: Secondary | ICD-10-CM

## 2017-07-05 DIAGNOSIS — Z9841 Cataract extraction status, right eye: Secondary | ICD-10-CM

## 2017-07-05 DIAGNOSIS — E785 Hyperlipidemia, unspecified: Secondary | ICD-10-CM | POA: Diagnosis not present

## 2017-07-05 DIAGNOSIS — I2510 Atherosclerotic heart disease of native coronary artery without angina pectoris: Secondary | ICD-10-CM | POA: Diagnosis present

## 2017-07-05 DIAGNOSIS — E118 Type 2 diabetes mellitus with unspecified complications: Secondary | ICD-10-CM | POA: Diagnosis present

## 2017-07-05 DIAGNOSIS — Z85038 Personal history of other malignant neoplasm of large intestine: Secondary | ICD-10-CM

## 2017-07-05 DIAGNOSIS — Z8673 Personal history of transient ischemic attack (TIA), and cerebral infarction without residual deficits: Secondary | ICD-10-CM

## 2017-07-05 DIAGNOSIS — I5021 Acute systolic (congestive) heart failure: Secondary | ICD-10-CM | POA: Diagnosis not present

## 2017-07-05 DIAGNOSIS — Z7982 Long term (current) use of aspirin: Secondary | ICD-10-CM

## 2017-07-05 DIAGNOSIS — Z79899 Other long term (current) drug therapy: Secondary | ICD-10-CM

## 2017-07-05 DIAGNOSIS — D631 Anemia in chronic kidney disease: Secondary | ICD-10-CM | POA: Diagnosis present

## 2017-07-05 DIAGNOSIS — R0602 Shortness of breath: Secondary | ICD-10-CM | POA: Diagnosis not present

## 2017-07-05 DIAGNOSIS — Z66 Do not resuscitate: Secondary | ICD-10-CM | POA: Diagnosis present

## 2017-07-05 DIAGNOSIS — I255 Ischemic cardiomyopathy: Secondary | ICD-10-CM | POA: Diagnosis present

## 2017-07-05 DIAGNOSIS — R001 Bradycardia, unspecified: Secondary | ICD-10-CM | POA: Diagnosis present

## 2017-07-05 LAB — BASIC METABOLIC PANEL
Anion gap: 10 (ref 5–15)
BUN: 51 mg/dL — ABNORMAL HIGH (ref 6–20)
CHLORIDE: 103 mmol/L (ref 101–111)
CO2: 27 mmol/L (ref 22–32)
CREATININE: 2.65 mg/dL — AB (ref 0.61–1.24)
Calcium: 9.2 mg/dL (ref 8.9–10.3)
GFR calc non Af Amer: 20 mL/min — ABNORMAL LOW (ref >60)
GFR, EST AFRICAN AMERICAN: 23 mL/min — AB (ref >60)
GLUCOSE: 135 mg/dL — AB (ref 65–99)
Potassium: 3.9 mmol/L (ref 3.5–5.1)
Sodium: 140 mmol/L (ref 135–145)

## 2017-07-05 LAB — CBC
HCT: 34.5 % — ABNORMAL LOW (ref 39.0–52.0)
HEMOGLOBIN: 11.4 g/dL — AB (ref 13.0–17.0)
MCH: 32.2 pg (ref 26.0–34.0)
MCHC: 33 g/dL (ref 30.0–36.0)
MCV: 97.5 fL (ref 78.0–100.0)
Platelets: 142 10*3/uL — ABNORMAL LOW (ref 150–400)
RBC: 3.54 MIL/uL — AB (ref 4.22–5.81)
RDW: 13.4 % (ref 11.5–15.5)
WBC: 8.1 10*3/uL (ref 4.0–10.5)

## 2017-07-05 LAB — BRAIN NATRIURETIC PEPTIDE: B Natriuretic Peptide: 529.6 pg/mL — ABNORMAL HIGH (ref 0.0–100.0)

## 2017-07-05 LAB — I-STAT TROPONIN, ED: Troponin i, poc: 0.02 ng/mL (ref 0.00–0.08)

## 2017-07-05 MED ORDER — TAMSULOSIN HCL 0.4 MG PO CAPS
0.4000 mg | ORAL_CAPSULE | Freq: Every day | ORAL | Status: DC
  Administered 2017-07-06 – 2017-07-07 (×2): 0.4 mg via ORAL
  Filled 2017-07-05 (×2): qty 1

## 2017-07-05 MED ORDER — ASPIRIN EC 81 MG PO TBEC
81.0000 mg | DELAYED_RELEASE_TABLET | Freq: Every day | ORAL | Status: DC
  Administered 2017-07-06 – 2017-07-08 (×3): 81 mg via ORAL
  Filled 2017-07-05 (×3): qty 1

## 2017-07-05 MED ORDER — FUROSEMIDE 10 MG/ML IJ SOLN
40.0000 mg | Freq: Once | INTRAMUSCULAR | Status: AC
  Administered 2017-07-05: 40 mg via INTRAVENOUS
  Filled 2017-07-05: qty 4

## 2017-07-05 MED ORDER — GLIMEPIRIDE 1 MG PO TABS
1.0000 mg | ORAL_TABLET | Freq: Every day | ORAL | Status: DC
  Administered 2017-07-07 – 2017-07-08 (×2): 1 mg via ORAL
  Filled 2017-07-05 (×4): qty 1

## 2017-07-05 MED ORDER — PANTOPRAZOLE SODIUM 40 MG PO TBEC
40.0000 mg | DELAYED_RELEASE_TABLET | Freq: Every day | ORAL | Status: DC
  Administered 2017-07-06 – 2017-07-08 (×3): 40 mg via ORAL
  Filled 2017-07-05 (×3): qty 1

## 2017-07-05 MED ORDER — LATANOPROST 0.005 % OP SOLN
1.0000 [drp] | Freq: Every day | OPHTHALMIC | Status: DC
  Administered 2017-07-05 – 2017-07-07 (×3): 1 [drp] via OPHTHALMIC
  Filled 2017-07-05: qty 2.5

## 2017-07-05 MED ORDER — EZETIMIBE 10 MG PO TABS
10.0000 mg | ORAL_TABLET | Freq: Every day | ORAL | Status: DC
  Administered 2017-07-05 – 2017-07-08 (×3): 10 mg via ORAL
  Filled 2017-07-05 (×3): qty 1

## 2017-07-05 MED ORDER — HYDRALAZINE HCL 50 MG PO TABS
50.0000 mg | ORAL_TABLET | Freq: Three times a day (TID) | ORAL | Status: DC
  Administered 2017-07-05 – 2017-07-08 (×6): 50 mg via ORAL
  Filled 2017-07-05 (×7): qty 1

## 2017-07-05 MED ORDER — CARVEDILOL 12.5 MG PO TABS
12.5000 mg | ORAL_TABLET | Freq: Two times a day (BID) | ORAL | Status: DC
Start: 2017-07-06 — End: 2017-07-08
  Administered 2017-07-06 (×2): 12.5 mg via ORAL
  Filled 2017-07-05 (×4): qty 1

## 2017-07-05 MED ORDER — ISOSORBIDE MONONITRATE ER 60 MG PO TB24
60.0000 mg | ORAL_TABLET | Freq: Every day | ORAL | Status: DC
  Administered 2017-07-06: 60 mg via ORAL
  Filled 2017-07-05: qty 1

## 2017-07-05 MED ORDER — ADULT MULTIVITAMIN W/MINERALS CH
1.0000 | ORAL_TABLET | Freq: Every day | ORAL | Status: DC
  Administered 2017-07-06 – 2017-07-08 (×3): 1 via ORAL
  Filled 2017-07-05 (×3): qty 1

## 2017-07-05 MED ORDER — FUROSEMIDE 10 MG/ML IJ SOLN: 60.0000 mg | Freq: Two times a day (BID) | INTRAMUSCULAR | Status: DC

## 2017-07-05 NOTE — ED Notes (Signed)
Pharmacy notified on pt.'s 10 pm medications .

## 2017-07-05 NOTE — ED Triage Notes (Signed)
Pt presents for evaluation of L sided cp and sob with fatigue starting today. Pt recently d/c for heart failure, started on new meds. Pt of Dr. Marigene Ehlers.

## 2017-07-05 NOTE — ED Provider Notes (Signed)
[facility] DEPT Provider Note   CSN: [id number] Arrival date & time: 07/05/17  1458     History   Chief Complaint Chief Complaint  Patient presents with  . Fatigue  . Chest Pain    HPI [name] [name] is a 83 y.o. male with hx of CAD, CABG, bioprosthetic AVR, anemia, CKD, Chronic systolic heart failure EF 25-30%. Patient is presenting today with new onset of left-sided chest pressure and shortness of breath that started this morning. Patient reports medication compliance including his Lasix. He states this feels exactly like it did when he was admitted 10 days ago. Endorses left-sided chest pressure that goes into the left shoulder and left upper arm. Shortness of breath. No fevers, chills, nausea, vomiting, abdominal pain. Patient does have cardiology follow-up tomorrow, but his wife stated she did not think he could make it until then without seeing a physician.  HPI  Past Medical History:  Diagnosis Date  . Anemia   . Anxiety   . Aortic stenosis    a. s/p AVR with 55mm Edwards pericardial valve 02/07/12 - post-op course complicated by pleural effusion requring thoracentesis/leg cellulitis 03/2012.  . Baker's cyst 07/23/12   Incidental finding of LE venous dopplers  . Blood transfusion    NO REACTION TO TRANSFUSION  . CAD (coronary artery disease)    a. s/p NSTEMI 12/2011:  LHC - Ostial left main 20%, ostial LAD 50%, mid 60-70%, ostial D1 40% and mid 40%, D2 70%, ostial circumflex occluded, ostial RCA 80-90%, LVEDP was 42. b.  s/p CABG x 3 at time of AVR (LiMA-LAD, SVG-2nd daigonal, SVG-PDA) 02/07/12 (post-op course noted above).  . Cancer [facility]) '90's   Colon  . Cataract    right eye, hx of  . Cellulitis    a. RLE cellulitis 2 months post-operatively after AVR/CABG - serratia marcessans, tx with I&D/antibiotics  . CHF (congestive heart failure) ([place])   . Chronic kidney disease   . Chronic systolic heart failure ([place])    a. TEE 01/2012: EF 25-30%, diffuse hypokinesis. b. Not  on ACEI due to renal insufficiency.;  c. follow up  echo 08/06/12: EF 25%, mod diast dysfxn, AVR ok, mild MR, mod LAE, mild RAE, mild to mod RV systolic dysfunction  . Diabetes mellitus    borderline  . Flash pulmonary edema ([place])    Post-cath 12/2011, went into acute pulm edema requiring IV lasix and intubation  . GERD (gastroesophageal reflux disease)   . History of colon cancer    s/p colon resection  . HLD (hyperlipidemia)   . HTN (hypertension)    primary, Dr. Hollace Kinnier  . Ischemic cardiomyopathy   . Mitral regurgitation    Moderate by TEE 01/2012  . Myocardial infarction ([place]) 12/2011  . Pleural effusion    a. post-operatively after AVR/CABG s/p thoracentesis 03/2012 yielding 1L serosanguinous fluid.  . Stroke ([place]) 10/01   RIght Leg weakness    Patient Active Problem List   Diagnosis Date Noted  . Acute on chronic systolic (congestive) heart failure ([place]) 06/25/2017  . Chest pain 09/20/2015  . Diabetes mellitus type 2, controlled, with complications ([place]) 09/98/3382  . S/P AVR (aortic valve replacement) 08/18/2013  . S/P CABG x 3: LIMA-LAD, SVG-D2, SVG-rPDA 09/17/2012  . Atherosclerosis of native coronary artery-- s/p CABG 01/11/2012  . Chronic combined systolic and diastolic heart failure, NYHA class 2 ([place]) 01/11/2012  . Ischemic cardiomyopathy 01/11/2012  . Chronic kidney disease (CKD), stage IV (severe) ([place]) 01/11/2012  . HLD (hyperlipidemia)  01/11/2012  . Acute on chronic renal failure ([place]) 12/13/2011  . Anemia due to chronic kidney disease 12/13/2011  . Hypertensive heart disease without CHF 02/03/2009    Past Surgical History:  Procedure Laterality Date  . AORTIC VALVE REPLACEMENT  02/07/2012   Procedure: AORTIC VALVE REPLACEMENT (AVR);  Surgeon: Gaye Pollack, MD;  Location: [place];  Service: Open Heart Surgery;  Laterality: N/A;  . CARDIAC CATHETERIZATION     1.3.13  stopped breathing, put on ventilator for 5-6 days  . CATARACT EXTRACTION     rt  . CHEST  TUBE INSERTION  07/24/2012   Procedure: INSERTION PLEURAL DRAINAGE CATHETER;  Surgeon: Gaye Pollack, MD;  Location: [place];  Service: Thoracic;  Laterality: Left;  . COLON RESECTION  1996  . COLONOSCOPY    . CORONARY ARTERY BYPASS GRAFT  02/07/2012   Procedure: CORONARY ARTERY BYPASS GRAFTING (CABG);  Surgeon: Gaye Pollack, MD;  Location: [place];  Service: Open Heart Surgery;  Laterality: N/A;  CABG x three; using right leg greater saphenous vein harvested endoscopically  . ERCP N/A 07/08/2014   Procedure: ENDOSCOPIC RETROGRADE CHOLANGIOPANCREATOGRAPHY (ERCP);  Surgeon: Missy Sabins, MD;  Location[facility] ENDOSCOPY;  Service: Endoscopy;  Laterality: N/A;  . ESOPHAGEAL DILATION    . ESOPHAGOGASTRODUODENOSCOPY N/A 07/03/2014   Procedure: ESOPHAGOGASTRODUODENOSCOPY (EGD);  Surgeon: Arta Silence, MD;  Location: [facility] ENDOSCOPY;  Service: Endoscopy;  Laterality: N/A;  . ESOPHAGOSCOPY WITH DILITATION N/A 07/07/2014   Procedure: ESOPHAGOSCOPY WITH DILITATION/SAVARY DILATOR;  Surgeon: Rozetta Nunnery, MD;  Location: [facility] OR;  Service: ENT;  Laterality: N/A;  . EYE SURGERY  2009   cataract removed from right eye  . INGUINAL HERNIA REPAIR Right 09/13/2015   Procedure: OPEN RIGHT INGUINAL HERNIA;  Surgeon: Mickeal Skinner, MD;  Location: [place];  Service: General;  Laterality: Right;  . INSERTION OF MESH Right 09/13/2015   Procedure: INSERTION OF MESH;  Surgeon: Mickeal Skinner, MD;  Location: [place];  Service: General;  Laterality: Right;  . LEFT HEART CATHETERIZATION WITH CORONARY ANGIOGRAM N/A 12/13/2011   Procedure: LEFT HEART CATHETERIZATION WITH CORONARY ANGIOGRAM;  Surgeon: Peter M Martinique, MD;  Location: [facility] CATH LAB;  Service: Cardiovascular;  Laterality: N/A;  . REMOVAL OF PLEURAL DRAINAGE CATHETER  09/27/2012   Procedure: REMOVAL OF PLEURAL DRAINAGE CATHETER;  Surgeon: Gaye Pollack, MD;  Location: [place];  Service: Thoracic;  Laterality: Left;  . TALC PLEURODESIS  08/30/2012   Procedure: Pietro Cassis;  Surgeon: Gaye Pollack, MD;  Location: [place];  Service: Thoracic;  Laterality: Left;  INSERTION OF TALC VIA LEFT PLEURX  . TALC PLEURODESIS  09/12/2012   Procedure: Pietro Cassis;  Surgeon: Gaye Pollack, MD;  Location: [place];  Service: Thoracic;  Laterality: Left;  . TONSILLECTOMY         Home Medications    Prior to Admission medications   Medication Sig Start Date End Date Taking? Authorizing Provider  aspirin EC 81 MG EC tablet Take 1 tablet (81 mg total) by mouth daily. 06/27/17  Yes Shirley Friar, PA-C  carvedilol (COREG) 12.5 MG tablet Take 1 tablet (12.5 mg total) by mouth 2 (two) times daily with a meal. 06/26/17  Yes Tillery, Satira Mccallum, PA-C  ezetimibe (ZETIA) 10 MG tablet Take 1 tablet (10 mg total) by mouth at bedtime. 06/26/17  Yes Shirley Friar, PA-C  furosemide (LASIX) 40 MG tablet Take 1.5 tablets (60 mg total) by mouth 2 (two) times daily. 06/26/17  Yes Shirley Friar, PA-C  glimepiride (AMARYL) 1 MG tablet TAKE 1 TABLET BY MOUTH DAILY WITH BREAKFAST Patient taking differently: TAKE 1MG  BY MOUTH DAILY WITH BREAKFAST 06/05/17  Yes [name], [name] L, DO  hydrALAZINE (APRESOLINE) 50 MG tablet Take 1 tablet (50 mg total) by mouth 3 (three) times daily. 06/26/17  Yes Shirley Friar, PA-C  isosorbide mononitrate (IMDUR) 60 MG 24 hr tablet TAKE 1 TABLET BY MOUTH EVERY DAY Patient taking differently: TAKE 60MG  BY MOUTH EVERY DAY 06/18/17  Yes Bensimhon, Shaune Pascal, MD  Multiple Vitamin (MULTIVITAMIN WITH MINERALS) TABS tablet Take 1 tablet by mouth daily.   Yes [provider]  nitroGLYCERIN (NITROSTAT) 0.4 MG SL tablet Place 0.4 mg under the tongue every 5 (five) minutes as needed for chest pain. As needed   Yes [provider]  omeprazole (PRILOSEC) 20 MG capsule Take 20 mg by mouth daily.    Yes [provider]  tamsulosin (FLOMAX) 0.4 MG CAPS capsule TAKE 1 CAPSULE BY MOUTH EVERY DAY AFTER  DINNER Patient taking differently: TAKE 0.4MG  BY MOUTH EVERY DAY AFTER DINNER 05/23/17  Yes [name], [name] L, DO  Travoprost, BAK Free, (TRAVATAN) 0.004 % SOLN ophthalmic solution Place 1 drop into both eyes at bedtime.    Yes [provider]  FREESTYLE LITE test strip USE TO TEST BLOOD SUGARS ONCE DAILY Patient not taking: Reported on 07/05/2017 12/25/16   Mariea Clonts, [name] L, DO  Lancets (FREESTYLE) lancets Use one lancet in morning before breakfast to check blood sugar.  Dx E11.8 Patient not taking: Reported on 07/05/2017 12/14/15   Gayland Curry, DO    Family History Family History  Problem Relation Age of Onset  . Cancer Mother   . Heart attack Father   . Mental illness Brother   . Kidney failure Brother     Social History Social History  Substance Use Topics  . Smoking status: Never Smoker  . Smokeless tobacco: Never Used  . Alcohol use No     Allergies   Statins   Review of Systems Review of Systems  Constitutional: Positive for fatigue. Negative for chills and fever.  HENT: Negative for ear pain and sore throat.   Eyes: Negative for pain and visual disturbance.  Respiratory: Positive for shortness of breath. Negative for cough and choking.   Cardiovascular: Positive for chest pain. Negative for palpitations.  Gastrointestinal: Negative for abdominal pain and vomiting.  Genitourinary: Negative for dysuria and hematuria.  Musculoskeletal: Negative for arthralgias and back pain.  Skin: Negative for color change and rash.  Neurological: Negative for seizures and syncope.  Psychiatric/Behavioral: Negative for agitation and behavioral problems.  All other systems reviewed and are negative.    Physical Exam Updated Vital Signs BP (!) 142/60   Pulse (!) 58   Temp 97.7 F (36.5 C) (Oral)   Resp (!) 24   Wt 67.9 kg (149 lb 11.2 oz) Comment: wife weighed this AM  SpO2 95%   BMI 22.11 kg/m   Physical Exam  Constitutional: He is oriented to person, place, and  time. He appears well-developed and well-nourished.  HENT:  Head: Normocephalic and atraumatic.  Eyes: Pupils are equal, round, and reactive to light. Conjunctivae and EOM are normal.  Neck: Neck supple. No tracheal deviation present.  Cardiovascular: Normal rate and regular rhythm.   No murmur heard. Pulmonary/Chest: No respiratory distress. He has no wheezes. He exhibits no tenderness.  Mild crackles in the bilateral bases. Tachypnea noted and speaking in short  phrases  Abdominal: Soft. There is no tenderness.  Musculoskeletal: He exhibits no edema.  Neurological: He is alert and oriented to person, place, and time.  Skin: Skin is warm and dry.  Psychiatric: He has a normal mood and affect.  Nursing note and vitals reviewed.    ED Treatments / Results  Labs (all labs ordered are listed, but only abnormal results are displayed) Labs Reviewed  BASIC METABOLIC PANEL - Abnormal; Notable for the following:       Result Value   Glucose, Bld 135 (*)    BUN 51 (*)    Creatinine, Ser 2.65 (*)    GFR calc non Af Amer 20 (*)    GFR calc Af Amer 23 (*)    All other components within normal limits  CBC - Abnormal; Notable for the following:    RBC 3.54 (*)    Hemoglobin 11.4 (*)    HCT 34.5 (*)    Platelets 142 (*)    All other components within normal limits  BRAIN NATRIURETIC PEPTIDE - Abnormal; Notable for the following:    B Natriuretic Peptide 529.6 (*)    All other components within normal limits  I-STAT TROPONIN, ED    EKG  EKG Interpretation None       Radiology Dg Chest 2 View  Result Date: 07/05/2017 CLINICAL DATA:  Chest pain.  History of heart failure. EXAM: CHEST  2 VIEW COMPARISON:  06/26/2017.  11/19/2015 . FINDINGS: Mediastinum hilar structures are normal. Prior CABG. Stable cardiomegaly. No pulmonary venous congestion. Stable left base pleuroparenchymal thickening noted consistent with scarring. No prominent pleural effusion. No pneumothorax . IMPRESSION: 1.  Prior CABG and cardiac valve replacement.  Stable cardiomegaly. 2. Pleural-parenchymal scarring on the left again noted. Chest is stable from prior exam . Electronically Signed   By: Marcello Moores  Register   On: 07/05/2017 16:02    Procedures Procedures (including critical care time)  Medications Ordered in ED Medications  aspirin EC tablet 81 mg (not administered)  carvedilol (COREG) tablet 12.5 mg (not administered)  ezetimibe (ZETIA) tablet 10 mg (not administered)  glimepiride (AMARYL) tablet 1 mg (not administered)  hydrALAZINE (APRESOLINE) tablet 50 mg (not administered)  isosorbide mononitrate (IMDUR) 24 hr tablet 60 mg (not administered)  multivitamin with minerals tablet 1 tablet (not administered)  pantoprazole (PROTONIX) EC tablet 40 mg (not administered)  tamsulosin (FLOMAX) capsule 0.4 mg (not administered)  latanoprost (XALATAN) 0.005 % ophthalmic solution 1 drop (not administered)  furosemide (LASIX) injection 60 mg (not administered)  furosemide (LASIX) injection 40 mg (40 mg Intravenous Given 07/05/17 1917)     Initial Impression / Assessment and Plan / ED Course  I have reviewed the triage vital signs and the nursing notes.  Pertinent labs & imaging results that were available during my care of the patient were reviewed by me and considered in my medical decision making (see chart for details).     Patient presenting with new onset of left-sided chest pressure and shortness of breath that started this morning. Recently discharged from hospital on the 17th and has a cardiology follow-up appointment tomorrow. Patient does appear to have moderately labored breathing and speaks in short sentences. Mildly tachypneic with decreased lung sounds in the left which seems to be similar to previous examinations. Patient reports compliance with his Lasix and other medications.  Negative troponin, all lab work similar to previous with no leukocytosis. Doubt ACS, and considering patient  with recent negative VQ scan similar presentation, I do  not believe d-dimer or VQ scan necessary at this time as his labwork and physical exam is more consistent with exacerbation of his CHF. Chest x-ray stable when compared to previous. EKG is Shows sinus rhythm and is reassuring with no ST segment elevations or other signs of acute ischemia. B type natruretic peptide elevated, and considering this along with physical exam, cardiology was consulted for further evaluation and management, and he will be admitted to their service.  Patient was seen with my attending, Dr. Rex Kras, who voiced agreement and oversaw the evaluation and treatment of this patient.   Dragon [employer] seismologist was used in the creation of this note. If there are any errors or inconsistencies needing clarification, please contact me directly.   Final Clinical Impressions(s) / ED Diagnoses   Final diagnoses:  SOB (shortness of breath)    New Prescriptions New Prescriptions   No medications on file     Valda Lamb, MD 07/05/17 2125    Rex Kras Wenda Overland, MD 07/10/17 [phone]

## 2017-07-05 NOTE — Progress Notes (Signed)
Spoke w/ pt and wife in the room. They are in agreement that he wishes to be NCB. Will write DNR order.  [name] [name] 07/05/2017 8:37 PM Beeper [phone]

## 2017-07-05 NOTE — H&P (Signed)
Advanced Heart Failure Team History and Physical Note   Primary Cardiologist: Dr. Aundra Dubin  Reason for Admission: A/C systolic CHF.  Chest pain.   HPI:    [name] [name] is a 83 y.o. male with a PMHx of CAD, CABG, bioprosthetic AVR, anemia, CKD creatinine 2.2 in 1194, Chronic systolic heart failure EF up and down was 40-45% 2016 Echo. He is not on an ACE or ARB due renal failure.   Last seen in HF clinic 04/12/2017 as he was running out of medicine/refills.Prior to that had not been seen since 11/2015. Was doing well overall. BP mildly elevated but consistent with long history of [place] HTN per patient.  Pt presented to [facility] 07/17 with left flank and chest pain with SOB that started that am. Pertinent labs include K 4.7, Creatinine 2.46, BNP 491, WBC 4.9, and Hgb 11.1.  CXR with cardiomegaly and mild pulmonary edema suspected. CT chest/abd/pelvis with no acute abnormalities. 46mm RUL nodule noted. He was diuresed overnight. Morning of 06/26/17 pt felt much better. With rapid improvement and negative work up for PE, patient thought stable for discharge home on increased lasix dose as below, with close follow up in HF clinic.  Echo did show EF decreased from previous Echo, but chest pain thought to be from volume overload, so no ischemic work up pursued at this time.    Pt was doing well at home. No significant change in daily weights, up 1 lb from d/c.  Today, pt had sudden onset of SOB and L chest pain. 7/10, worse with deep inspiration and has a tender spot mid-lower rib edge. The SOB worsened until he felt he needed help and came to the ER. In the ER, he received Lasix 40 mg IV x 1. He has diuresed some and feels much better. The pain got better when his breathing improved.     Review of Systems: [y] = yes, [ ]  = no   General:  Weight gain [+ ]; Weight loss [ ] ; Anorexia [ ] ; Fatigue [ ] ; Fever [ ] ; Chills [ ] ; Weakness [ +]  Cardiac: Chest pain/pressure [y]; Resting SOB [ ] ; Exertional SOB  [y]; Orthopnea [ +]; Pedal Edema [ ] ; Palpitations [ ] ; Syncope [ ] ; Presyncope [ ] ; Paroxysmal nocturnal dyspnea[ ]   Pulmonary: Cough [ ] ; Wheezing[ ] ; Hemoptysis[ ] ; Sputum [ ] ; Snoring [ ]   GI: Vomiting[ ] ; Dysphagia[ ] ; Melena[ ] ; Hematochezia [ ] ; Heartburn[ ] ; Abdominal pain [ ] ; Constipation [ ] ; Diarrhea [ ] ; BRBPR [ ]   GU: Hematuria[ ] ; Dysuria [ ] ; Nocturia[ ]   Vascular: Pain in legs with walking [ ] ; Pain in feet with lying flat [ ] ; Non-healing sores [ ] ; Stroke [ ] ; TIA [ ] ; Slurred speech [ ] ;  Neuro: Headaches[ ] ; Vertigo[ ] ; Seizures[ ] ; Paresthesias[ ] ;Blurred vision [ ] ; Diplopia [ ] ; Vision changes [ ]   Ortho/Skin: Arthritis [y]; Joint pain [y]; Muscle pain [ ] ; Joint swelling [ ] ; Back Pain [ ] ; Rash [ ]  Rib pain  [y] Psych: Depression[ ] ; Anxiety[ ]   Heme: Bleeding problems [ ] ; Clotting disorders [ ] ; Anemia [ ]   Endocrine: Diabetes [ ] ; Thyroid dysfunction[ ]    Home Medications Prior to Admission medications   Medication Sig Start Date End Date Taking? Authorizing Provider  aspirin EC 325 MG tablet Take 325 mg by mouth daily.    Yes [provider]  carvedilol (COREG) 12.5 MG tablet Take 1 tablet (12.5 mg total) by mouth 2 (two) times  daily with a meal. 04/12/17  Yes Tillery, Satira Mccallum, PA-C  furosemide (LASIX) 40 MG tablet Take 1-1/ 2 tablets (60 mg total) by mouth daily. 11/09/16  Yes Bensimhon, Shaune Pascal, MD  glimepiride (AMARYL) 1 MG tablet TAKE 1 TABLET BY MOUTH DAILY WITH BREAKFAST 06/05/17  Yes [name], [name] L, DO  hydrALAZINE (APRESOLINE) 50 MG tablet TAKE 1 TABLET BY MOUTH THREE TIMES DAILY 05/03/17  Yes [name], [name] L, DO  isosorbide mononitrate (IMDUR) 60 MG 24 hr tablet TAKE 1 TABLET BY MOUTH EVERY DAY 06/18/17  Yes Bensimhon, Shaune Pascal, MD  Multiple Vitamin (MULTIVITAMIN WITH MINERALS) TABS tablet Take 1 tablet by mouth daily.   Yes [provider]  nitroGLYCERIN (NITROSTAT) 0.4 MG SL tablet Place 0.4 mg under the tongue every 5 (five) minutes  as needed for chest pain. As needed   Yes [provider]  omeprazole (PRILOSEC) 20 MG capsule Take 20 mg by mouth daily.    Yes [provider]  tamsulosin (FLOMAX) 0.4 MG CAPS capsule TAKE 1 CAPSULE BY MOUTH EVERY DAY AFTER DINNER 05/23/17  Yes [name], [name] L, DO  Travoprost, BAK Free, (TRAVATAN) 0.004 % SOLN ophthalmic solution Place 1 drop into both eyes at bedtime.    Yes [provider]  FREESTYLE LITE test strip USE TO TEST BLOOD SUGARS ONCE DAILY 12/25/16   [name], [name] L, DO  Lancets (FREESTYLE) lancets Use one lancet in morning before breakfast to check blood sugar.  Dx E11.8 12/14/15   Gayland Curry, DO    Past Medical History: Past Medical History:  Diagnosis Date  . Anemia   . Anxiety   . Aortic stenosis    a. s/p AVR with 49mm Edwards pericardial valve 02/07/12 - post-op course complicated by pleural effusion requring thoracentesis/leg cellulitis 03/2012.  . Baker's cyst 07/23/12   Incidental finding of LE venous dopplers  . Blood transfusion    NO REACTION TO TRANSFUSION  . CAD (coronary artery disease)    a. s/p NSTEMI 12/2011:  LHC - Ostial left main 20%, ostial LAD 50%, mid 60-70%, ostial D1 40% and mid 40%, D2 70%, ostial circumflex occluded, ostial RCA 80-90%, LVEDP was 42. b.  s/p CABG x 3 at time of AVR (LiMA-LAD, SVG-2nd daigonal, SVG-PDA) 02/07/12 (post-op course noted above).  . Cancer [facility]) '90's   Colon  . Cataract    right eye, hx of  . Cellulitis    a. RLE cellulitis 2 months post-operatively after AVR/CABG - serratia marcessans, tx with I&D/antibiotics  . CHF (congestive heart failure) ([place])   . Chronic kidney disease   . Chronic systolic heart failure ([place])    a. TEE 01/2012: EF 25-30%, diffuse hypokinesis. b. Not on ACEI due to renal insufficiency.;  c. follow up  echo 08/06/12: EF 25%, mod diast dysfxn, AVR ok, mild [name], mod LAE, mild RAE, mild to mod RV systolic dysfunction  . Diabetes mellitus    borderline  . Flash pulmonary  edema ([place])    Post-cath 12/2011, went into acute pulm edema requiring IV lasix and intubation  . GERD (gastroesophageal reflux disease)   . History of colon cancer    s/p colon resection  . HLD (hyperlipidemia)   . HTN (hypertension)    primary, Dr. Hollace Kinnier  . Ischemic cardiomyopathy   . Mitral regurgitation    Moderate by TEE 01/2012  . Myocardial infarction ([place]) 12/2011  . Pleural effusion    a. post-operatively after AVR/CABG s/p thoracentesis 03/2012 yielding 1L serosanguinous  fluid.  . Stroke ([place]) 10/01   RIght Leg weakness    Past Surgical History: Past Surgical History:  Procedure Laterality Date  . AORTIC VALVE REPLACEMENT  02/07/2012   Procedure: AORTIC VALVE REPLACEMENT (AVR);  Surgeon: Gaye Pollack, MD;  Location: [place];  Service: Open Heart Surgery;  Laterality: N/A;  . CARDIAC CATHETERIZATION     1.3.13  stopped breathing, put on ventilator for 5-6 days  . CATARACT EXTRACTION     rt  . CHEST TUBE INSERTION  07/24/2012   Procedure: INSERTION PLEURAL DRAINAGE CATHETER;  Surgeon: Gaye Pollack, MD;  Location: [place];  Service: Thoracic;  Laterality: Left;  . COLON RESECTION  1996  . COLONOSCOPY    . CORONARY ARTERY BYPASS GRAFT  02/07/2012   Procedure: CORONARY ARTERY BYPASS GRAFTING (CABG);  Surgeon: Gaye Pollack, MD;  Location: [place];  Service: Open Heart Surgery;  Laterality: N/A;  CABG x three; using right leg greater saphenous vein harvested endoscopically  . ERCP N/A 07/08/2014   Procedure: ENDOSCOPIC RETROGRADE CHOLANGIOPANCREATOGRAPHY (ERCP);  Surgeon: Missy Sabins, MD;  Location[facility] END[facility]COPY;  Service: Endoscopy;  Laterality: N/A;  . ESOPHAGEAL DILATION    . ESOPHAGOGASTRODUODENOSCOPY N/A 07/03/2014   Procedure: ESOPHAGOGASTRODUODENOSCOPY (EGD);  Surgeon: Arta Silence, MD;  Location: [facility] ENDOSCOPY;  Service: Endoscopy;  Laterality: N/A;  . ESOPHAGOSCOPY WITH DILITATION N/A 07/07/2014   Procedure: ESOPHAGOSCOPY WITH DILITATION/SAVARY DILATOR;  Surgeon:  Rozetta Nunnery, MD;  Location: [facility] OR;  Service: ENT;  Laterality: N/A;  . EYE SURGERY  2009   cataract removed from right eye  . INGUINAL HERNIA REPAIR Right 09/13/2015   Procedure: OPEN RIGHT INGUINAL HERNIA;  Surgeon: Mickeal Skinner, MD;  Location: [place];  Service: General;  Laterality: Right;  . INSERTION OF MESH Right 09/13/2015   Procedure: INSERTION OF MESH;  Surgeon: Mickeal Skinner, MD;  Location: [place];  Service: General;  Laterality: Right;  . LEFT HEART CATHETERIZATION WITH CORONARY ANGIOGRAM N/A 12/13/2011   Procedure: LEFT HEART CATHETERIZATION WITH CORONARY ANGIOGRAM;  Surgeon: Peter M Martinique, MD;  Location: [facility] CATH LAB;  Service: Cardiovascular;  Laterality: N/A;  . REMOVAL OF PLEURAL DRAINAGE CATHETER  09/27/2012   Procedure: REMOVAL OF PLEURAL DRAINAGE CATHETER;  Surgeon: Gaye Pollack, MD;  Location: [place];  Service: Thoracic;  Laterality: Left;  . TALC PLEURODESIS  08/30/2012   Procedure: Pietro Cassis;  Surgeon: Gaye Pollack, MD;  Location: [place];  Service: Thoracic;  Laterality: Left;  INSERTION OF TALC VIA LEFT PLEURX  . TALC PLEURODESIS  09/12/2012   Procedure: Pietro Cassis;  Surgeon: Gaye Pollack, MD;  Location: [place];  Service: Thoracic;  Laterality: Left;  . TONSILLECTOMY      Family History:  Family History  Problem Relation Age of Onset  . Cancer Mother   . Heart attack Father   . Mental illness Brother   . Kidney failure Brother     Social History: Social History   Social History  . Marital status: Married    Spouse name: N/A  . Number of children: N/A  . Years of education: N/A   Occupational History  . retired owned [employer]    Social History Main Topics  . Smoking status: Never Smoker  . Smokeless tobacco: Never Used  . Alcohol use No  . Drug use: No  . Sexual activity: Not Currently   Other Topics Concern  . None   Social History Narrative   Married  Angelita Ingles   Never smoked   Alcohol none   Exercise  walking, weights   Living Will, MOST form       Allergies:  Allergies  Allergen Reactions  . Statins Other (See Comments)    Pain/weakness in legs    Objective:    Vital Signs:   Temp:  [97.7 F (36.5 C)] 97.7 F (36.5 C) (07/26 1507) Pulse Rate:  [55-62] 56 (07/26 1930) Resp:  [17-39] 25 (07/26 1930) BP: (135-153)/(54-63) 153/56 (07/26 1930) SpO2:  [95 %-97 %] 96 % (07/26 1930) Weight:  [149 lb 11.2 oz (67.9 kg)] 149 lb 11.2 oz (67.9 kg) (07/26 1506)   Filed Weights   07/05/17 1506  Weight: 149 lb 11.2 oz (67.9 kg)     Physical Exam     General:  Elderly appearing. Mildly dyspneic with conversation.  HEENT: Normal Neck: Supple. JVP ~ 10 cm. Carotids 2+ bilat; no bruits. No lymphadenopathy or thyromegaly appreciated. Cor: PMI nondisplaced. Regular. Loletha Grayer. Occasional ectopy. 1/6 SEM RUSB Lungs: Mildly diminished basilar sounds. Few scattered rales Abdomen: Soft, nontender, mildly distended. No hepatosplenomegaly. No bruits or masses. Good bowel sounds. Extremities: No cyanosis, clubbing, rash, edema Neuro: Alert & oriented x 3, cranial nerves grossly intact. moves all 4 extremities w/o difficulty. Affect pleasant.   Telemetry   Personally reviewed, Sinus bradycardia   EKG   Personally reviewed from 07/05/17. Sinus bradycardia, HR 56 with borderline 1st degree AVB and dropped PAC  Labs     Basic Metabolic Panel:  Recent Labs Lab 07/05/17 1517  NA 140  K 3.9  CL 103  CO2 27  GLUCOSE 135*  BUN 51*  CREATININE 2.65*  CALCIUM 9.2   CBC:  Recent Labs Lab 07/05/17 1517  WBC 8.1  HGB 11.4*  HCT 34.5*  MCV 97.5  PLT 142*     BNP: BNP (last 3 results)  Recent Labs  06/25/17 1114 07/05/17 1517  BNP 491.0* 529.6*     Troponin i, poc  Date Value Ref Range Status  07/05/2017 0.02 0.00 - 0.08 ng/mL Final  06/25/2017 0.02 0.00 - 0.08 ng/mL Final  10/23/2015 0.03 0.00 - 0.08 ng/mL Final   Imaging: Dg Chest 2 View Result Date:  07/05/2017 CLINICAL DATA:  Chest pain.  History of heart failure. EXAM: CHEST  2 VIEW COMPARISON:  06/26/2017.  11/19/2015 . FINDINGS: Mediastinum hilar structures are normal. Prior CABG. Stable cardiomegaly. No pulmonary venous congestion. Stable left base pleuroparenchymal thickening noted consistent with scarring. No prominent pleural effusion. No pneumothorax . IMPRESSION: 1. Prior CABG and cardiac valve replacement.  Stable cardiomegaly. 2. Pleural-parenchymal scarring on the left again noted. Chest is stable from prior exam . Electronically Signed   By: Marcello Moores  Register   On: 07/05/2017 16:02   ECHO: 06/26/2017 - Left ventricle: The cavity size was normal. Wall thickness was   normal. Systolic function was severely reduced. The estimated   ejection fraction was in the range of 25% to 30%. Diffuse   hypokinesis. Doppler parameters are consistent with abnormal left   ventricular relaxation (grade 1 diastolic dysfunction). - Aortic valve: A bioprosthesis was present and functioning   normally. Mean gradient (S): 7 mm Hg. - Mitral valve: Calcified annulus. - Left atrium: The atrium was moderately dilated. Impressions: - Compared to the prior study, there has been no significant   interval change.   Patient Profile   [name] [name] is a 83 y.o. male with a PMHx of CAD, CABG, bioprosthetic AVR, anemia, CKD last creatinine  2.2, Chronic systolic heart failure EF 20-25%. He is not on an ACE or ARB due renal failure. Admitted 07/16-07/17 with CHF. Presented to [facility] with SOB and Chest pressure. Cardiology team asked to see for evaluation/admit.    Assessment/Plan   1. Chest pain  - Atypical. Initial troponin negative. Will trend - Cont ASA. - Pt does have history of CAD with CABG but sx are more consistent with either MS pain or are 2nd CHF  2. Acute on chronic systolic CHF - NYHA III symptoms at baseline, IV today.  - Volume status elevated. Agree with 40 mg IV lasix. BNP mildly elevated  compared to previous admit. Will follow UO on this dose for tonight, and give again in am. Then per CHF team. - Recent Echo 06/26/2017 with LVEF 25-30% - No ACE/ARB with CKD III/IV - Continue coreg at current dose. Follow for bradycardia. Pt chronically stable on this dose.  - No spiro or digoxin with CKD.  3. CKD III-IV - Follow closely with diuresis  4. Bioprosthetic AVR - Functioning well on recent echo.   Volume overloaded on exam. Will observe overnight at least with IV lasix. Chest pain atypical, doubt any utility of LHC with this patient.   Lenoard Aden 07/05/2017, 7:41 PM  Advanced Heart Failure Team Pager [phone] (M-F; 7a - 4p)  Please contact [facility] for night-coverage after hours (4p -7a ) and weekends on [email or web address]   Patient seen and examined.  Extensive old chart reviewed.  The patient had bypass in 2013 with aortic valve replacement.  At the time the circumflex and the first diagonal were not able to be bypassed and are thus unbypassed and in addition the other vessels reported to be significantly diseased.  He recently began to present with left sided chest symptoms and his creatinine has slowly increased.  Recent ejection fraction has been lower than the previous one.  His symptoms started abruptly today in the absence of weight gain according to careful weights by the spouse.  He complained of left pressure-like chest pain and did complain of some tenderness.  Symptoms were relieved somewhat with some furosemide.  1.  I would be concerned that this may be angina or ischemia particularly since he had poor quality distal vessels and has several unbypassed vessels.  I would not consider him to be a candidate for repeat catheterization at his age and his renal function 2.  Chronic systolic heart failure possible acute component all the BNP is not greatly high and chest x-ray did not really show much congestion and he does not have peripheral edema although JVD is  up 3.  Stage IV chronic kidney disease 4.  Anxiety  Recommendations: Will attempt diuresis overnight but would wonder whether he needs to have attention directed towards increasing antianginal therapy also.

## 2017-07-06 ENCOUNTER — Inpatient Hospital Stay (HOSPITAL_COMMUNITY): Admit: 2017-07-06 | Payer: Medicare Other | Admitting: Cardiology

## 2017-07-06 DIAGNOSIS — Z9841 Cataract extraction status, right eye: Secondary | ICD-10-CM | POA: Diagnosis not present

## 2017-07-06 DIAGNOSIS — R0602 Shortness of breath: Secondary | ICD-10-CM | POA: Diagnosis not present

## 2017-07-06 DIAGNOSIS — D631 Anemia in chronic kidney disease: Secondary | ICD-10-CM | POA: Diagnosis present

## 2017-07-06 DIAGNOSIS — Z7984 Long term (current) use of oral hypoglycemic drugs: Secondary | ICD-10-CM | POA: Diagnosis not present

## 2017-07-06 DIAGNOSIS — Z951 Presence of aortocoronary bypass graft: Secondary | ICD-10-CM | POA: Diagnosis not present

## 2017-07-06 DIAGNOSIS — E785 Hyperlipidemia, unspecified: Secondary | ICD-10-CM | POA: Diagnosis present

## 2017-07-06 DIAGNOSIS — K219 Gastro-esophageal reflux disease without esophagitis: Secondary | ICD-10-CM | POA: Diagnosis present

## 2017-07-06 DIAGNOSIS — Z85038 Personal history of other malignant neoplasm of large intestine: Secondary | ICD-10-CM | POA: Diagnosis not present

## 2017-07-06 DIAGNOSIS — I130 Hypertensive heart and chronic kidney disease with heart failure and stage 1 through stage 4 chronic kidney disease, or unspecified chronic kidney disease: Secondary | ICD-10-CM | POA: Diagnosis present

## 2017-07-06 DIAGNOSIS — F410 Panic disorder [episodic paroxysmal anxiety] without agoraphobia: Secondary | ICD-10-CM | POA: Diagnosis present

## 2017-07-06 DIAGNOSIS — N184 Chronic kidney disease, stage 4 (severe): Secondary | ICD-10-CM | POA: Diagnosis present

## 2017-07-06 DIAGNOSIS — Z79899 Other long term (current) drug therapy: Secondary | ICD-10-CM | POA: Diagnosis not present

## 2017-07-06 DIAGNOSIS — Z8249 Family history of ischemic heart disease and other diseases of the circulatory system: Secondary | ICD-10-CM | POA: Diagnosis not present

## 2017-07-06 DIAGNOSIS — Z66 Do not resuscitate: Secondary | ICD-10-CM | POA: Diagnosis present

## 2017-07-06 DIAGNOSIS — E1122 Type 2 diabetes mellitus with diabetic chronic kidney disease: Secondary | ICD-10-CM | POA: Diagnosis present

## 2017-07-06 DIAGNOSIS — I252 Old myocardial infarction: Secondary | ICD-10-CM | POA: Diagnosis not present

## 2017-07-06 DIAGNOSIS — I5043 Acute on chronic combined systolic (congestive) and diastolic (congestive) heart failure: Secondary | ICD-10-CM | POA: Diagnosis not present

## 2017-07-06 DIAGNOSIS — Z8673 Personal history of transient ischemic attack (TIA), and cerebral infarction without residual deficits: Secondary | ICD-10-CM | POA: Diagnosis not present

## 2017-07-06 DIAGNOSIS — Z953 Presence of xenogenic heart valve: Secondary | ICD-10-CM | POA: Diagnosis not present

## 2017-07-06 DIAGNOSIS — Z9109 Other allergy status, other than to drugs and biological substances: Secondary | ICD-10-CM | POA: Diagnosis not present

## 2017-07-06 DIAGNOSIS — I255 Ischemic cardiomyopathy: Secondary | ICD-10-CM | POA: Diagnosis present

## 2017-07-06 DIAGNOSIS — Z7982 Long term (current) use of aspirin: Secondary | ICD-10-CM | POA: Diagnosis not present

## 2017-07-06 DIAGNOSIS — I2510 Atherosclerotic heart disease of native coronary artery without angina pectoris: Secondary | ICD-10-CM | POA: Diagnosis present

## 2017-07-06 DIAGNOSIS — R001 Bradycardia, unspecified: Secondary | ICD-10-CM | POA: Diagnosis present

## 2017-07-06 LAB — BASIC METABOLIC PANEL
ANION GAP: 12 (ref 5–15)
BUN: 48 mg/dL — ABNORMAL HIGH (ref 6–20)
CALCIUM: 9.1 mg/dL (ref 8.9–10.3)
CO2: 30 mmol/L (ref 22–32)
Chloride: 99 mmol/L — ABNORMAL LOW (ref 101–111)
Creatinine, Ser: 2.78 mg/dL — ABNORMAL HIGH (ref 0.61–1.24)
GFR, EST AFRICAN AMERICAN: 22 mL/min — AB (ref >60)
GFR, EST NON AFRICAN AMERICAN: 19 mL/min — AB (ref >60)
Glucose, Bld: 197 mg/dL — ABNORMAL HIGH (ref 65–99)
Potassium: 3.7 mmol/L (ref 3.5–5.1)
Sodium: 141 mmol/L (ref 135–145)

## 2017-07-06 LAB — TROPONIN I: Troponin I: 0.05 ng/mL (ref <0.03)

## 2017-07-06 LAB — GLUCOSE, CAPILLARY: GLUCOSE-CAPILLARY: 137 mg/dL — AB (ref 65–99)

## 2017-07-06 MED ORDER — FUROSEMIDE 10 MG/ML IJ SOLN: 40.0000 mg | Freq: Two times a day (BID) | INTRAMUSCULAR | Status: DC

## 2017-07-06 MED ORDER — ISOSORBIDE MONONITRATE ER 60 MG PO TB24
90.0000 mg | ORAL_TABLET | Freq: Every day | ORAL | Status: DC
  Administered 2017-07-07 – 2017-07-08 (×2): 90 mg via ORAL
  Filled 2017-07-06 (×2): qty 1

## 2017-07-06 MED ORDER — ACETAMINOPHEN 325 MG PO TABS
650.0000 mg | ORAL_TABLET | ORAL | Status: DC | PRN
  Administered 2017-07-07: 650 mg via ORAL
  Filled 2017-07-06: qty 2

## 2017-07-06 MED ORDER — ENOXAPARIN SODIUM 30 MG/0.3ML ~~LOC~~ SOLN
30.0000 mg | SUBCUTANEOUS | Status: DC
  Administered 2017-07-06 – 2017-07-07 (×2): 30 mg via SUBCUTANEOUS
  Filled 2017-07-06 (×2): qty 0.3

## 2017-07-06 MED ORDER — SODIUM CHLORIDE 0.9% FLUSH
3.0000 mL | Freq: Two times a day (BID) | INTRAVENOUS | Status: DC
  Administered 2017-07-06 – 2017-07-07 (×4): 3 mL via INTRAVENOUS

## 2017-07-06 MED ORDER — SODIUM CHLORIDE 0.9% FLUSH: 3.0000 mL | INTRAVENOUS | Status: DC | PRN

## 2017-07-06 MED ORDER — FUROSEMIDE 10 MG/ML IJ SOLN
40.0000 mg | Freq: Once | INTRAMUSCULAR | Status: AC
  Administered 2017-07-06: 40 mg via INTRAVENOUS
  Filled 2017-07-06: qty 4

## 2017-07-06 MED ORDER — ISOSORBIDE MONONITRATE ER 30 MG PO TB24
30.0000 mg | ORAL_TABLET | Freq: Once | ORAL | Status: AC
  Administered 2017-07-06: 30 mg via ORAL
  Filled 2017-07-06: qty 1

## 2017-07-06 MED ORDER — ALPRAZOLAM 0.25 MG PO TABS
0.2500 mg | ORAL_TABLET | Freq: Two times a day (BID) | ORAL | Status: DC | PRN
  Administered 2017-07-06: 0.25 mg via ORAL
  Administered 2017-07-07: 0.125 mg via ORAL
  Filled 2017-07-06 (×2): qty 1

## 2017-07-06 MED ORDER — FUROSEMIDE 80 MG PO TABS: 80.0000 mg | ORAL_TABLET | Freq: Every day | ORAL | Status: DC

## 2017-07-06 MED ORDER — ONDANSETRON HCL 4 MG/2ML IJ SOLN: 4.0000 mg | Freq: Four times a day (QID) | INTRAMUSCULAR | Status: DC | PRN

## 2017-07-06 MED ORDER — FUROSEMIDE 80 MG PO TABS
80.0000 mg | ORAL_TABLET | Freq: Two times a day (BID) | ORAL | Status: DC
Start: 2017-07-06 — End: 2017-07-08
  Administered 2017-07-06 – 2017-07-08 (×4): 80 mg via ORAL
  Filled 2017-07-06 (×4): qty 1

## 2017-07-06 MED ORDER — FUROSEMIDE 40 MG PO TABS: 60.0000 mg | ORAL_TABLET | Freq: Every day | ORAL | Status: DC

## 2017-07-06 MED ORDER — ZOLPIDEM TARTRATE 5 MG PO TABS
5.0000 mg | ORAL_TABLET | Freq: Every evening | ORAL | Status: DC | PRN
  Administered 2017-07-06: 5 mg via ORAL
  Filled 2017-07-06: qty 1

## 2017-07-06 MED ORDER — SODIUM CHLORIDE 0.9 % IV SOLN: 250.0000 mL | INTRAVENOUS | Status: DC | PRN

## 2017-07-06 NOTE — Progress Notes (Signed)
Advanced Heart Failure Rounding Note  Primary Cardiologist: Dr. Aundra Dubin   Subjective:    Admitted 07/05/17 with CP and SOB. Started on IV lasix.  CXR with pleural-parenchymal scarring. No PTX. No prominent pleural effusion. No obvious edema.   Feeling much better. States his SOB was sudden yesterday while seated at rest.  Accompanied by chest pressure with same quality as his previous admission for volume overload.  Denies dietary or medication non-compliance.   Negative 900 cc recorded, but no intake recorded. Creatinine mildly elevated from baseline at 2.78 today.  Echo 06/26/17 LVEF 25-30%, Grade 1 DD, Stable AV bioprosthetic, Mod LAE (EF down from 40-45% in 2016)  Objective:   Weight Range: 149 lb 11.2 oz (67.9 kg) Body mass index is 22.11 kg/m.   Vital Signs:   Temp:  [97.7 F (36.5 C)] 97.7 F (36.5 C) (07/26 1507) Pulse Rate:  [29-67] 55 (07/27 0530) Resp:  [16-39] 16 (07/27 0530) BP: (93-159)/(37-92) 132/50 (07/27 0530) SpO2:  [92 %-97 %] 96 % (07/27 0530) Weight:  [149 lb 11.2 oz (67.9 kg)] 149 lb 11.2 oz (67.9 kg) (07/26 1506)    Weight change: Filed Weights   07/05/17 1506  Weight: 149 lb 11.2 oz (67.9 kg)    Intake/Output:   Intake/Output Summary (Last 24 hours) at 07/06/17 0955 Last data filed at 07/06/17 0926  Gross per 24 hour  Intake              120 ml  Output             1100 ml  Net             -980 ml      Physical Exam    General:  Elderly appearing. Mild dyspnea.  HEENT: Normal Neck: Supple. JVP 8-9 cm. Carotids 2+ bilat; no bruits. No lymphadenopathy or thyromegaly appreciated. Cor: PMI nondisplaced. Regular rate & rhythm. No rubs, gallops or murmurs. Lungs: Clear Abdomen: Soft, nontender, nondistended. No hepatosplenomegaly. No bruits or masses. Good bowel sounds. Extremities: No cyanosis, clubbing, rash, or edema.  Neuro: Alert & orientedx3, cranial nerves grossly intact. moves all 4 extremities w/o difficulty. Affect  pleasant  Telemetry   Personally reviewed, NSR  EKG    Personally reviewed, 07/05/17 NSR -> Sinus brady 56 bpm  Labs    CBC  Recent Labs  07/05/17 1517  WBC 8.1  HGB 11.4*  HCT 34.5*  MCV 97.5  PLT 500*   Basic Metabolic Panel  Recent Labs  07/05/17 1517  NA 140  K 3.9  CL 103  CO2 27  GLUCOSE 135*  BUN 51*  CREATININE 2.65*  CALCIUM 9.2   Liver Function Tests No results for input(s): AST, ALT, ALKPHOS, BILITOT, PROT, ALBUMIN in the last 72 hours. No results for input(s): LIPASE, AMYLASE in the last 72 hours. Cardiac Enzymes No results for input(s): CKTOTAL, CKMB, CKMBINDEX, TROPONINI in the last 72 hours.  BNP: BNP (last 3 results)  Recent Labs  06/25/17 1114 07/05/17 1517  BNP 491.0* 529.6*    ProBNP (last 3 results) No results for input(s): PROBNP in the last 8760 hours.   D-Dimer No results for input(s): DDIMER in the last 72 hours. Hemoglobin A1C No results for input(s): HGBA1C in the last 72 hours. Fasting Lipid Panel No results for input(s): CHOL, HDL, LDLCALC, TRIG, CHOLHDL, LDLDIRECT in the last 72 hours. Thyroid Function Tests No results for input(s): TSH, T4TOTAL, T3FREE, THYROIDAB in the last 72 hours.  Invalid input(s): FREET3  Other results:   Imaging    Dg Chest 2 View  Result Date: 07/05/2017 CLINICAL DATA:  Chest pain.  History of heart failure. EXAM: CHEST  2 VIEW COMPARISON:  06/26/2017.  11/19/2015 . FINDINGS: Mediastinum hilar structures are normal. Prior CABG. Stable cardiomegaly. No pulmonary venous congestion. Stable left base pleuroparenchymal thickening noted consistent with scarring. No prominent pleural effusion. No pneumothorax . IMPRESSION: 1. Prior CABG and cardiac valve replacement.  Stable cardiomegaly. 2. Pleural-parenchymal scarring on the left again noted. Chest is stable from prior exam . Electronically Signed   By: Marcello Moores  Register   On: 07/05/2017 16:02      Medications:     Scheduled  Medications: . aspirin EC  81 mg Oral Daily  . carvedilol  12.5 mg Oral BID WC  . enoxaparin (LOVENOX) injection  30 mg Subcutaneous Q24H  . ezetimibe  10 mg Oral QHS  . furosemide  40 mg Intravenous Q12H  . glimepiride  1 mg Oral Q breakfast  . hydrALAZINE  50 mg Oral TID  . isosorbide mononitrate  60 mg Oral Daily  . latanoprost  1 drop Both Eyes QHS  . multivitamin with minerals  1 tablet Oral Daily  . pantoprazole  40 mg Oral Daily  . sodium chloride flush  3 mL Intravenous Q12H  . tamsulosin  0.4 mg Oral QPC supper     Infusions: . sodium chloride       PRN Medications:  sodium chloride, acetaminophen, ALPRAZolam, ondansetron (ZOFRAN) IV, sodium chloride flush, zolpidem    Patient Profile   [name] [name] is a 83 y.o. male with a PMHx of CAD, CABG, bioprosthetic AVR, anemia, CKD last creatinine 2.2, Chronic systolic heart failure EF 20-25%. He is not on an ACE or ARB due renal failure. Admitted 07/16-07/17 with CHF.   Admitted 07/06/17 with SOB and CP.   Assessment/Plan   1. Chest pain in pt with h/o CAD s/p CABG - Atypical. Initial troponin negative. Not trended. Will check once more this am.   - Cont ASA. -  Chest pain somewhat concerning for angina with sudden nature. Not candidate for cath with elevated renal function, and not dialysis candidate.   2. Acute on chronic systolic CHF - NYHA III symptoms at baseline, IV on admission.  - Volume status improved.  - Given IV lasix 40 mg this am. Transition to po lasix 80 mg BID.   - Recent Echo 06/26/2017 with LVEF 25-30% - No ACE/ARB with CKD III/IV - Continue coreg at current dose. Follow for bradycardia. Pt chronically stable on this dose.  - No spiro or digoxin with CKD.  3. CKD III-IV - Mildly elevated, but not drawn yet today. Await BMET. (Ordered but not drawn)  4. Bioprosthetic AVR - Functioning well on recent echo.  - No change.  Acute SOB yesterday with no clear trigger, was seated at rest and  weight only up 1 lb from discharge. Appetite has been OK, but patient may have lost body weight and gained fluid with his chronic disease.   Has responded well to IV lasix. Repeat dose this am.   VQ scan negative last admission. Will observe overnight sudden nature of his SOB, and rise in creatinine.   Length of Stay: 0  [name] [name]  07/06/2017, 9:55 AM  Advanced Heart Failure Team Pager [phone] (M-F; 7a - 4p)  Please contact [facility] for night-coverage after hours (4p -7a ) and weekends on [email or web address]  Patient seen with PA, agree  with the above note.  He was doing well until last night, had sudden onset of a pressure sensation in left chest/upper abdomen and dyspnea. He came to the ER and felt better quickly after getting a dose of IV Lasix.  1. CAD: s/p CABG.  Unusual presentation for CAD.  TnI 0.05, suspect may be due to volume overload/CHF.  Symptoms improved rapidly with IV Lasix.  No change to ECG.  - Trend troponin to make sure no rise.   - Continue ASA, cannot tolerate statins.  - Increase Imdur to 90 mg daily.  - Continue Coreg. - Avoid cardiac cath with CKD IV and no definite ACS.  - Of note, V/Q scan negative last admission.  2. Acute on chronic systolic CHF: EF down to 44-62% on 7/18 echo (was 40-45% prior).  Per notes, he looked volume overloaded on exam yesterday.  BNP elevated.  He got IV Lasix yesterday and felt better.  On exam today, he does not look volume overloaded.  - Can stop IV Lasix now. Would start po Lasix, use 80 mg po bid rather than 60 mg po bid with CHF admission.  - Continue hydralazine/nitrates - Continue current Coreg (no room to increase as HR around 50).  3. CKD stage IV: Fairly stable so far, follow closely.  Would stop IV Lasix at this point.  4. Bioprosthetic AVR: Stable on last echo.   Would watch overnight, check BMET in am for creatinine, likely home in am.   [name] [name] 07/06/2017 12:41 PM

## 2017-07-06 NOTE — ED Notes (Signed)
Attempted to call report

## 2017-07-06 NOTE — Care Management Note (Signed)
Case Management Note  Patient Details  Name: [name] [name] MRN: [id number] Date of Birth: [birth year]  Subjective/Objective:       CHF            Action/Plan: Patient lives at home with spouse; PCP is Dr Joneen Caraway; has private insurance with Medicare/ BCBS with prescription drug coverage; pharmacy of choice is [facility]; patient continues to drive his vehicle; his wife cooks a heart healthy diet low in sodium; CM offered [facility] for ongoing education/ DM program; patient doesn't want any HHC at this time. CM will continue to follow for DCP.  Expected Discharge Date:  07/07/17               Expected Discharge Plan:  [place]  In-House Referral:   [facility]  Discharge planning Services  CM Consult  Choice offered to:  Patient  HH Arranged:  Patient Refused HH  Status of Service:  In process, will continue to follow  Sherrilyn Rist [phone] 07/06/2017, 3:38 PM

## 2017-07-06 NOTE — Progress Notes (Signed)
CRITICAL VALUE ALERT  Critical Value:  Troponin 0.05  Date & Time Notied:  07/06/17 at 11:40am  Provider Notified: Oda Kilts  Orders Received/Actions taken: No new orders received.

## 2017-07-07 LAB — BASIC METABOLIC PANEL
ANION GAP: 9 (ref 5–15)
BUN: 55 mg/dL — AB (ref 6–20)
CHLORIDE: 102 mmol/L (ref 101–111)
CO2: 29 mmol/L (ref 22–32)
Calcium: 9.1 mg/dL (ref 8.9–10.3)
Creatinine, Ser: 2.96 mg/dL — ABNORMAL HIGH (ref 0.61–1.24)
GFR calc Af Amer: 20 mL/min — ABNORMAL LOW (ref >60)
GFR, EST NON AFRICAN AMERICAN: 17 mL/min — AB (ref >60)
Glucose, Bld: 133 mg/dL — ABNORMAL HIGH (ref 65–99)
POTASSIUM: 3.6 mmol/L (ref 3.5–5.1)
SODIUM: 140 mmol/L (ref 135–145)

## 2017-07-07 LAB — GLUCOSE, CAPILLARY: GLUCOSE-CAPILLARY: 145 mg/dL — AB (ref 65–99)

## 2017-07-07 NOTE — Progress Notes (Signed)
Patient ambulated in hall with nurse, feltalmost back to his baseline per patient.  A little unsteady on the turns.  Wife at bedside.  Patient says he is starting to feel more like himself.

## 2017-07-07 NOTE — Plan of Care (Signed)
Problem: Tissue Perfusion: Goal: Risk factors for ineffective tissue perfusion will decrease Outcome: Progressing Oxygen saturation remains in the 90's on room air   Problem: Activity: Goal: Risk for activity intolerance will decrease Outcome: Progressing Ambulated in hallway with nurse

## 2017-07-07 NOTE — Progress Notes (Signed)
Progress Note  Patient Name: [name] [name] Date of Encounter: 07/07/2017  Primary Cardiologist: Dr.Mclean  Subjective   RN called me to see patient because he said he does not feel right. Around 9:30pm last night the patient was given a dose of PRN Xanax and Ambien at the same time for unknown reasons. Since receiving that medication he reports feeling "crazy". He is crying and shaking and reports not feeling right since getting those medications.  Telemetry screen normal, no neurological deficits.   Blood pressure (!) 181/54, pulse 67, temperature 98.6 F (37 C), temperature source Oral, resp. rate 19, height 5\' 9"  (1.753 m), weight 152 lb 11.2 oz (69.3 kg), SpO2 95 %.   Inpatient Medications    Scheduled Meds: . aspirin EC  81 mg Oral Daily  . carvedilol  12.5 mg Oral BID WC  . enoxaparin (LOVENOX) injection  30 mg Subcutaneous Q24H  . ezetimibe  10 mg Oral QHS  . furosemide  80 mg Oral BID  . glimepiride  1 mg Oral Q breakfast  . hydrALAZINE  50 mg Oral TID  . isosorbide mononitrate  90 mg Oral Daily  . latanoprost  1 drop Both Eyes QHS  . multivitamin with minerals  1 tablet Oral Daily  . pantoprazole  40 mg Oral Daily  . sodium chloride flush  3 mL Intravenous Q12H  . tamsulosin  0.4 mg Oral QPC supper   Continuous Infusions: . sodium chloride     PRN Meds: sodium chloride, acetaminophen, ALPRAZolam, ondansetron (ZOFRAN) IV, sodium chloride flush   Vital Signs    Vitals:   07/06/17 1209 07/06/17 2002 07/07/17 0415 07/07/17 0817  BP: (!) 124/46 (!) 165/59 (!) 139/57 (!) 181/54  Pulse: (!) 55 (!) 54 (!) 58 67  Resp: 20 18 19    Temp: 97.7 F (36.5 C) 98.4 F (36.9 C) 98.6 F (37 C)   TempSrc: Oral Oral Oral   SpO2: 97% 97% 98% 95%  Weight: 153 lb (69.4 kg)  152 lb 11.2 oz (69.3 kg)   Height: 5\' 9"  (1.753 m)       Intake/Output Summary (Last 24 hours) at 07/07/17 0846 Last data filed at 07/07/17 0109  Gross per 24 hour  Intake              603 ml    Output              600 ml  Net                3 ml   Filed Weights   07/05/17 1506 07/06/17 1209 07/07/17 0415  Weight: 149 lb 11.2 oz (67.9 kg) 153 lb (69.4 kg) 152 lb 11.2 oz (69.3 kg)    Telemetry    NSR - Personally Reviewed   Physical Exam   General:  Elderly appearing. Mildly dyspneic with conversation.  HEENT: Normal Neck: Supple. Increased JVP Cor: regular rhythm. Occasional ectopy.  Lungs: Mildly diminished basilar sounds. Few scattered rales Abdomen: Soft, nontender, mildly distended. Extremities: No cyanosis, clubbing, rash, edema Neuro: Alert & oriented x 3, cranial nerves grossly intact. moves all 4 extremities w/o difficulty. Affect pleasant. Psych: Anxious and crying.   Labs    Chemistry Recent Labs Lab 07/05/17 1517 07/06/17 1019 07/07/17 0433  NA 140 141 140  K 3.9 3.7 3.6  CL 103 99* 102  CO2 27 30 29   GLUCOSE 135* 197* 133*  BUN 51* 48* 55*  CREATININE 2.65* 2.78* 2.96*  CALCIUM 9.2 9.1  9.1  GFRNONAA 20* 19* 17*  GFRAA 23* 22* 20*  ANIONGAP 10 12 9      Hematology Recent Labs Lab 07/05/17 1517  WBC 8.1  RBC 3.54*  HGB 11.4*  HCT 34.5*  MCV 97.5  MCH 32.2  MCHC 33.0  RDW 13.4  PLT 142*    Cardiac Enzymes Recent Labs Lab 07/06/17 1032  TROPONINI 0.05*    Recent Labs Lab 07/05/17 1533  TROPIPOC 0.02     BNP Recent Labs Lab 07/05/17 1517  BNP 529.6*     DDimer No results for input(s): DDIMER in the last 168 hours.   Radiology    Dg Chest 2 View  Result Date: 07/05/2017 CLINICAL DATA:  Chest pain.  History of heart failure. EXAM: CHEST  2 VIEW COMPARISON:  06/26/2017.  11/19/2015 . FINDINGS: Mediastinum hilar structures are normal. Prior CABG. Stable cardiomegaly. No pulmonary venous congestion. Stable left base pleuroparenchymal thickening noted consistent with scarring. No prominent pleural effusion. No pneumothorax . IMPRESSION: 1. Prior CABG and cardiac valve replacement.  Stable cardiomegaly. 2.  Pleural-parenchymal scarring on the left again noted. Chest is stable from prior exam . Electronically Signed   By: [place]   On: 07/05/2017 16:02    Cardiac Studies   Transthoracic Echocardiogram 06/26/2017  Study Conclusions  - Left ventricle: The cavity size was normal. Wall thickness was   normal. Systolic function was severely reduced. The estimated   ejection fraction was in the range of 25% to 30%. Diffuse   hypokinesis. Doppler parameters are consistent with abnormal left   ventricular relaxation (grade 1 diastolic dysfunction). - Aortic valve: A bioprosthesis was present and functioning   normally. Mean gradient (S): 7 mm Hg. - Mitral valve: Calcified annulus. - Left atrium: The atrium was moderately dilated.  Impressions:  - Compared to the prior study, there has been no significant   interval change.  Patient Profile     [name] [name] is a 83 y.o. male with a PMHx of CAD, CABG, bioprosthetic AVR, anemia, CKD creatinine 2.2 in 7371, Chronic systolic heart failure EF up and down was 40-45% 2016 Echo. He is not on an ACE or ARB due renal failure.    Assessment & Plan    1. CAD s/p CABG: Patients symptoms not felt to be cardiac related. Had one troponin yesterday which resulted as 0.05, no further values in epic to assess trend. No further chest pain overnight. The patient is upset and crying this morning because he  "feels crazy" since he got medication last night. He was given Ambien and Xanax last night, unclear as to why. Dc'd ambien. Will give a small 0.125mg  dose of Xanax to help with his panic attack.  Continue ASA, cannot tolerate statins.  - Increase Imdur to 90 mg daily.  - Continue Coreg. - Avoid cardiac cath with CKD IV and no definite ACS.   2. Acute on chronic systolic CHF: EF down to 06-26% on 7/18 echo (was 40-45%). Appears euvolemic, transitioned to oral lasix yesterday; 80mg  po BID.  - Continue hydralazine/nitrates - Continue current Coreg  (no room to increase as HR around 50).    3. CKD stage IV: Slightly elevated lasix this AM, overall mostly stable.   4. Bioprosthetic AVR: Stable on last echo.   Dr. Oleh Genin plan was to watch patient overnight and recheck BMET for creatinine and likely home in the AM. His creatinine is elevated to 2.78 << 2.96. BUN 48 << 55. Will need to get patient  feeling better overall, eating, up and walking and hopefully can go home this afternoon if doing well.    Kristopher Glee, PA-C  07/07/2017, 8:46 AM    Pt seen and examined  I agree with findings of T Greene above   Pt very agitated at [employer]  Wife says he got a Ambien last night and it has made him like this  Pt does not want to eat  Says his abdomen hurts    On exam, pt shaky  Neck  JVP normal  Lungs cTa  Cardiac RRR  No S3  Abdomen  Minimall tenderness  Ext  No edema     I would follow  No change in meds  Try to let him eat   Xanax  prm Poss home later or in Am.    Dorris Carnes

## 2017-07-07 NOTE — Progress Notes (Signed)
Patient says he feels better and wants to go home.  Text page sent to cardiology PA.

## 2017-07-07 NOTE — Progress Notes (Signed)
Wife called RN back to room stating patient needed something for pain.  Patient lying in bed shaking, rapid breathing appears to be very anxious.  Patient initially denied any pain, just said he was shaking then said he was in pain and shaking.  Tylenol and other half of the Xanax administered as per previous order by PA.  Wife in room, upset, says she can not handle patient at home like this.    Cardiology in room at time Tylenol administered.

## 2017-07-07 NOTE — Progress Notes (Signed)
When RN entered the room NT in the room stating patient says he does not feel right.  Patient lying in bed, shaking, says he feels "off".  Night nurse had reported patient received Ambien, woke up in the middle of the night confused and had to be walked around the unit to assist him in calming down.  VSS, sat patient up to eat breakfast.  Grips equal strength, oriented x 4 although he did have a heard time with the year knew where he was, the president, the month, etc.  PA for cards text paged.

## 2017-07-07 NOTE — Progress Notes (Signed)
Patient much calmer at this time, shaking has stopped.  Will continue to monitor.

## 2017-07-08 ENCOUNTER — Other Ambulatory Visit: Payer: Self-pay | Admitting: Cardiology

## 2017-07-08 DIAGNOSIS — N289 Disorder of kidney and ureter, unspecified: Secondary | ICD-10-CM

## 2017-07-08 LAB — BASIC METABOLIC PANEL
ANION GAP: 10 (ref 5–15)
BUN: 52 mg/dL — AB (ref 6–20)
CHLORIDE: 99 mmol/L — AB (ref 101–111)
CO2: 29 mmol/L (ref 22–32)
Calcium: 8.9 mg/dL (ref 8.9–10.3)
Creatinine, Ser: 3.1 mg/dL — ABNORMAL HIGH (ref 0.61–1.24)
GFR calc Af Amer: 19 mL/min — ABNORMAL LOW (ref >60)
GFR calc non Af Amer: 16 mL/min — ABNORMAL LOW (ref >60)
GLUCOSE: 124 mg/dL — AB (ref 65–99)
POTASSIUM: 3.2 mmol/L — AB (ref 3.5–5.1)
Sodium: 138 mmol/L (ref 135–145)

## 2017-07-08 MED ORDER — GLIMEPIRIDE 1 MG PO TABS: ORAL_TABLET | ORAL | 1 refills | Status: AC

## 2017-07-08 MED ORDER — POTASSIUM CHLORIDE CRYS ER 20 MEQ PO TBCR: 20.0000 meq | EXTENDED_RELEASE_TABLET | Freq: Every day | ORAL | Status: DC

## 2017-07-08 MED ORDER — ACETAMINOPHEN 325 MG PO TABS: 650.0000 mg | ORAL_TABLET | Freq: Four times a day (QID) | ORAL | Status: AC | PRN

## 2017-07-08 MED ORDER — ISOSORBIDE MONONITRATE ER 60 MG PO TB24: 90.0000 mg | ORAL_TABLET | Freq: Every day | ORAL | 3 refills | Status: DC

## 2017-07-08 MED ORDER — TAMSULOSIN HCL 0.4 MG PO CAPS: ORAL_CAPSULE | ORAL | 0 refills | Status: DC

## 2017-07-08 MED ORDER — POTASSIUM CHLORIDE CRYS ER 20 MEQ PO TBCR
40.0000 meq | EXTENDED_RELEASE_TABLET | Freq: Once | ORAL | Status: AC
  Administered 2017-07-08: 40 meq via ORAL
  Filled 2017-07-08: qty 2

## 2017-07-08 MED ORDER — POTASSIUM CHLORIDE CRYS ER 20 MEQ PO TBCR: 20.0000 meq | EXTENDED_RELEASE_TABLET | Freq: Every day | ORAL | 5 refills | Status: DC

## 2017-07-08 MED ORDER — FUROSEMIDE 40 MG PO TABS: 80.0000 mg | ORAL_TABLET | Freq: Two times a day (BID) | ORAL | 6 refills | Status: DC

## 2017-07-08 NOTE — Discharge Instructions (Addendum)
Have lab work drawn on 8/2       Heart Failure Heart failure means your heart has trouble pumping blood. This makes it hard for your body to work well. Heart failure is usually a long-term (chronic) condition. You must take good care of yourself and follow your doctor's treatment plan. Follow these instructions at home:  Take your heart medicine as told by your doctor. ? Do not stop taking medicine unless your doctor tells you to. ? Do not skip any dose of medicine. ? Refill your medicines before they run out. ? Take other medicines only as told by your doctor or pharmacist.  Stay active if told by your doctor. The elderly and people with severe heart failure should talk with a doctor about physical activity.  Eat heart-healthy foods. Choose foods that are without trans fat and are low in saturated fat, cholesterol, and salt (sodium). This includes fresh or frozen fruits and vegetables, fish, lean meats, fat-free or low-fat dairy foods, whole grains, and high-fiber foods. Lentils and dried peas and beans (legumes) are also good choices.  Limit salt if told by your doctor.  Cook in a healthy way. Roast, grill, broil, bake, poach, steam, or stir-fry foods.  Limit fluids as told by your doctor.  Weigh yourself every morning. Do this after you pee (urinate) and before you eat breakfast. Write down your weight to give to your doctor.  Take your blood pressure and write it down if your doctor tells you to.  Ask your doctor how to check your pulse. Check your pulse as told.  Lose weight if told by your doctor.  Stop smoking or chewing tobacco. Do not use gum or patches that help you quit without your doctor's approval.  Schedule and go to doctor visits as told.  Nonpregnant women should have no more than 1 drink a day. Men should have no more than 2 drinks a day. Talk to your doctor about drinking alcohol.  Stop illegal drug use.  Stay current with shots (immunizations).  Manage  your health conditions as told by your doctor.  Learn to manage your stress.  Rest when you are tired.  If it is really hot outside: ? Avoid intense activities. ? Use air conditioning or fans, or get in a cooler place. ? Avoid caffeine and alcohol. ? Wear loose-fitting, lightweight, and light-colored clothing.  If it is really cold outside: ? Avoid intense activities. ? Layer your clothing. ? Wear mittens or gloves, a hat, and a scarf when going outside. ? Avoid alcohol.  Learn about heart failure and get support as needed.  Get help to maintain or improve your quality of life and your ability to care for yourself as needed. Contact a doctor if:  You gain weight quickly.  You are more short of breath than usual.  You cannot do your normal activities.  You tire easily.  You cough more than normal, especially with activity.  You have any or more puffiness (swelling) in areas such as your hands, feet, ankles, or belly (abdomen).  You cannot sleep because it is hard to breathe.  You feel like your heart is beating fast (palpitations).  You get dizzy or light-headed when you stand up. Get help right away if:  You have trouble breathing.  There is a change in mental status, such as becoming less alert or not being able to focus.  You have chest pain or discomfort.  You faint. This information is not intended to replace  advice given to you by your health care provider. Make sure you discuss any questions you have with your health care provider. Document Released: 09/05/2008 Document Revised: 05/04/2016 Document Reviewed: 01/13/2013 Elsevier Interactive Patient Education  2017 [employer].

## 2017-07-08 NOTE — Discharge Summary (Signed)
Patient ID: [name] [name],  MRN: [id number], DOB/AGE: 81-Nov-1927 81 y.o.  Admit date: 07/05/2017 Discharge date: 07/08/2017  Primary Care Provider: Gayland Curry, DO Primary Cardiologist: Dr [facility]  Discharge Diagnoses Principal Problem:   Acute on chronic combined systolic and diastolic CHF (congestive heart failure) ([place]) Active Problems:   Ischemic cardiomyopathy   HLD (hyperlipidemia)   S/P AVR (aortic valve replacement)   Diabetes mellitus type 2, controlled, with complications (HCC)   S/P CABG x 3: LIMA-LAD, SVG-D2, SVG-rPDA    Hospital Course: [name] [name] is a 83 y.o. male with a PMHx of CAD, CABG, bioprosthetic AVR, anemia, CKD, and chronic systolic heart failure. Last seen in HF clinic 04/12/2017 as he was running out of medicine/refills. Prior to that had not been seen since 11/2015.    He was recently admitted 06/25/17-7/17-18 with CHF.His EF was 25-30% and his prosthetic AOV showed no stenosis by echo 06/26/17. He was ddischarged then re-admitted 07/05/17 with acute on chronic CHF. His Lasix dose was increased and changed to IV for a few doses. His SCr drifted up from 2.65 to 3.10 (this may be his new baseline). His I/O was negative 1.7L but his wgt was essentially unchanged- 149 lbs on adm- 151 lbs at discharge. He had some anxiety and confusion after receiving a dose of Ambien but this cleared. He had some sinus arrhythmia and brachycardia, we considered decreasing his beta blocker but this settled down and we kept him on Coreg 12.5mg  BID. Dr Harrington Challenger feels he can be discharged 07/08/17. He has a follow up arranged in the CHF clinic. I will arrange for a f/u BMP on 8/2.   Discharge Vitals:  Blood pressure (!) 164/59, pulse (!) 54, temperature 97.7 F (36.5 C), temperature source Oral, resp. rate 18, height 5\' 9"  (1.753 m), weight 151 lb 3.2 oz (68.6 kg), SpO2 98 %.    Labs: Results for orders placed or performed during the hospital encounter of 07/05/17 (from the past  24 hour(s))  Basic metabolic panel     Status: Abnormal   Collection Time: 07/08/17  4:33 AM  Result Value Ref Range   Sodium 138 135 - 145 mmol/L   Potassium 3.2 (L) 3.5 - 5.1 mmol/L   Chloride 99 (L) 101 - 111 mmol/L   CO2 29 22 - 32 mmol/L   Glucose, Bld 124 (H) 65 - 99 mg/dL   BUN 52 (H) 6 - 20 mg/dL   Creatinine, Ser 3.10 (H) 0.61 - 1.24 mg/dL   Calcium 8.9 8.9 - 10.3 mg/dL   GFR calc non Af Amer 16 (L) >60 mL/min   GFR calc Af Amer 19 (L) >60 mL/min   Anion gap 10 5 - 15    Disposition:  Follow-up Information    Shirley Friar, PA-C Follow up on 07/24/2017.   Specialty:  Physician Assistant Why:  at 2:30 pm for hospital follow up. Garage code [id number].  Contact information: [place] [address] [phone]           Discharge Medications:  Allergies as of 07/08/2017      Reactions   Statins Other (See Comments)   Pain/weakness in legs   Ambien [zolpidem Tartrate] Anxiety      Medication List    STOP taking these medications   freestyle lancets   FREESTYLE LITE test strip Generic drug:  glucose blood     TAKE these medications   acetaminophen 325 MG tablet Commonly known as:  TYLENOL Take 2 tablets (650 mg total) by mouth every 6 (six) hours as needed for mild pain or headache.   aspirin 81 MG EC tablet Take 1 tablet (81 mg total) by mouth daily.   carvedilol 12.5 MG tablet Commonly known as:  COREG Take 1 tablet (12.5 mg total) by mouth 2 (two) times daily with a meal.   ezetimibe 10 MG tablet Commonly known as:  ZETIA Take 1 tablet (10 mg total) by mouth at bedtime.   furosemide 40 MG tablet Commonly known as:  LASIX Take 2 tablets (80 mg total) by mouth 2 (two) times daily. What changed:  how much to take   glimepiride 1 MG tablet Commonly known as:  AMARYL TAKE 1MG  BY MOUTH DAILY WITH BREAKFAST   hydrALAZINE 50 MG tablet Commonly known as:  APRESOLINE Take 1 tablet (50 mg total) by mouth 3 (three) times daily.     isosorbide mononitrate 60 MG 24 hr tablet Commonly known as:  IMDUR Take 1.5 tablets (90 mg total) by mouth daily. What changed:  See the new instructions.   multivitamin with minerals Tabs tablet Take 1 tablet by mouth daily.   nitroGLYCERIN 0.4 MG SL tablet Commonly known as:  NITROSTAT Place 0.4 mg under the tongue every 5 (five) minutes as needed for chest pain. As needed   omeprazole 20 MG capsule Commonly known as:  PRILOSEC Take 20 mg by mouth daily.   potassium chloride SA 20 MEQ tablet Commonly known as:  K-DUR,KLOR-CON Take 1 tablet (20 mEq total) by mouth daily.   tamsulosin 0.4 MG Caps capsule Commonly known as:  FLOMAX TAKE 0.4MG  BY MOUTH EVERY DAY AFTER DINNER   Travoprost (BAK Free) 0.004 % Soln ophthalmic solution Commonly known as:  TRAVATAN Place 1 drop into both eyes at bedtime.        Duration of Discharge Encounter: Greater than 30 minutes including physician time.  Angelena Form PA-C 07/08/2017 11:30 AM

## 2017-07-08 NOTE — Progress Notes (Signed)
Discharge instructions reviewed with patient and wife, questions answered, verbalized understanding.  Patient transported to the front of hospital via wheelchair to be taken home by wife and family friend.

## 2017-07-08 NOTE — Progress Notes (Signed)
Progress Note  Patient Name: [name] [name] Date of Encounter: 07/08/2017  Primary Cardiologist: Dr.Mclean  Subjective   Pt says he feels pretty good this am, no further anxiety attacks. He did have some chest discomfort yesterday "not bad". He did not alert the staff.   Blood pressure (!) 169/43, pulse 60, temperature 97.72F (37 C), temperature source Oral, resp. rate 19, height 5\' 9"  (1.753 m), weight 152 lb 11.2 oz (69.3 kg), SpO2 95 %.   Inpatient Medications    Scheduled Meds: . aspirin EC  81 mg Oral Daily  . carvedilol  12.5 mg Oral BID WC  . enoxaparin (LOVENOX) injection  30 mg Subcutaneous Q24H  . ezetimibe  10 mg Oral QHS  . furosemide  80 mg Oral BID  . glimepiride  1 mg Oral Q breakfast  . hydrALAZINE  50 mg Oral TID  . isosorbide mononitrate  90 mg Oral Daily  . latanoprost  1 drop Both Eyes QHS  . multivitamin with minerals  1 tablet Oral Daily  . pantoprazole  40 mg Oral Daily  . sodium chloride flush  3 mL Intravenous Q12H  . tamsulosin  0.4 mg Oral QPC supper   Continuous Infusions: . sodium chloride     PRN Meds: sodium chloride, acetaminophen, ALPRAZolam, ondansetron (ZOFRAN) IV, sodium chloride flush   Vital Signs    Vitals:   07/07/17 1207 07/07/17 1700 07/07/17 1946 07/08/17 0445  BP: (!) 108/56 (!) 118/52 (!) 117/57 (!) 169/43  Pulse: (!) 54 (!) 54 60 60  Resp: 18  18 18   Temp: (!) 97.5 F (36.4 C)  97.9 F (36.6 C) 97.7 F (36.5 C)  TempSrc: Oral  Oral Oral  SpO2: 96% 96% 98% 97%  Weight:    151 lb 3.2 oz (68.6 kg)  Height:        Intake/Output Summary (Last 24 hours) at 07/08/17 0815 Last data filed at 07/08/17 0657  Gross per 24 hour  Intake              240 ml  Output             1100 ml  Net             -860 ml   Filed Weights   07/06/17 1209 07/07/17 0415 07/08/17 0445  Weight: 153 lb (69.4 kg) 152 lb 11.2 oz (69.3 kg) 151 lb 3.2 oz (68.6 kg)    Telemetry    NSR - at times he had what appears to be sinus  arrhythmia with resultant bradycardia.    Physical Exam   General:  Elderly appearing. Mildly dyspneic with conversation.  HEENT: Normal Neck: Supple. Increased JVP Cor: regular rhythm. Occasional ectopy.  Lungs: Mildly diminished basilar sounds. Few scattered rales Abdomen: Soft, nontender, mildly distended. Extremities: No cyanosis, clubbing, rash, edema Neuro: Alert & oriented x 3, cranial nerves grossly intact. moves all 4 extremities w/o difficulty. Affect pleasant. Psych: Normal  Labs    Chemistry  Recent Labs Lab 07/06/17 1019 07/07/17 0433 07/08/17 0433  NA 141 140 138  K 3.7 3.6 3.2*  CL 99* 102 99*  CO2 30 29 29   GLUCOSE 197* 133* 124*  BUN 48* 55* 52*  CREATININE 2.78* 2.96* 3.10*  CALCIUM 9.1 9.1 8.9  GFRNONAA 19* 17* 16*  GFRAA 22* 20* 19*  ANIONGAP 12 9 10      Hematology  Recent Labs Lab 07/05/17 1517  WBC 8.1  RBC 3.54*  HGB 11.4*  HCT 34.5*  MCV 97.5  MCH 32.2  MCHC 33.0  RDW 13.4  PLT 142*    Cardiac Enzymes  Recent Labs Lab 07/06/17 1032  TROPONINI 0.05*     Recent Labs Lab 07/05/17 1533  TROPIPOC 0.02     BNP  Recent Labs Lab 07/05/17 1517  BNP 529.6*     DDimer No results for input(s): DDIMER in the last 168 hours.   Radiology    CXR- 07/05/17 IMPRESSION: 1. Prior CABG and cardiac valve replacement.  Stable cardiomegaly.  2. Pleural-parenchymal scarring on the left again noted. Chest is stable from prior exam .   Cardiac Studies   Transthoracic Echocardiogram 06/26/2017  Study Conclusions  - Left ventricle: The cavity size was normal. Wall thickness was   normal. Systolic function was severely reduced. The estimated   ejection fraction was in the range of 25% to 30%. Diffuse   hypokinesis. Doppler parameters are consistent with abnormal left   ventricular relaxation (grade 1 diastolic dysfunction). - Aortic valve: A bioprosthesis was present and functioning   normally. Mean gradient (S): 7 mm  Hg. - Mitral valve: Calcified annulus. - Left atrium: The atrium was moderately dilated.  Impressions:  - Compared to the prior study, there has been no significant   interval change.  Patient Profile     [name] is a 83 y.o. male with a PMHx of CAD, CABG, bioprosthetic AVR, anemia, CKD creatinine 2.2 in 1884, Chronic systolic heart failure EF up and down was 40-45% 2016 Echo. He is not on an ACE or ARB due renal failure.    Assessment & Plan    1. CAD s/p CABG:   -Continue ASA, cannot tolerate statins.  - Increase Imdur to 90 mg daily.  - Coreg was held last two doses secondary to bradycardia - Avoid cardiac cath with CKD IV and no definite ACS.   2. Acute on chronic systolic CHF: EF down to 16-60% on 7/18 echo (was 40-45%). Appears euvolemic, transitioned to oral lasix yesterday; 80mg  po BID.  - hydralazine/nitrates-held yesterday secondary to borderline low B/P   3. CKD stage IV: SCr continues to climb-  4. Bioprosthetic AVR: Stable on last echo.   Plan: Will discuss with MD- resume hydralazine, not sure he will be able to tolerate Coreg at current dose of 12.5 mg BID. His Lasix has been increased to 80 mg BID from his home dose of 60 mg BID. Possible discharge later today, f/u arranged for 8/14.   Signed, Kerin Ransom, PA-C  07/08/2017, 8:15 AM

## 2017-07-08 NOTE — Progress Notes (Signed)
Progress Note  Patient Name: [name] [name] Date of Encounter: 07/08/2017  Primary Cardiologist:   Bensimhon    Subjective   Feeling good  No CP  No SOB  Wants to go home    Inpatient Medications    Scheduled Meds: . aspirin EC  81 mg Oral Daily  . carvedilol  12.5 mg Oral BID WC  . enoxaparin (LOVENOX) injection  30 mg Subcutaneous Q24H  . ezetimibe  10 mg Oral QHS  . furosemide  80 mg Oral BID  . glimepiride  1 mg Oral Q breakfast  . hydrALAZINE  50 mg Oral TID  . isosorbide mononitrate  90 mg Oral Daily  . latanoprost  1 drop Both Eyes QHS  . multivitamin with minerals  1 tablet Oral Daily  . pantoprazole  40 mg Oral Daily  . sodium chloride flush  3 mL Intravenous Q12H  . tamsulosin  0.4 mg Oral QPC supper   Continuous Infusions: . sodium chloride     PRN Meds: sodium chloride, acetaminophen, ALPRAZolam, ondansetron (ZOFRAN) IV, sodium chloride flush   Vital Signs    Vitals:   07/07/17 1207 07/07/17 1700 07/07/17 1946 07/08/17 0445  BP: (!) 108/56 (!) 118/52 (!) 117/57 (!) 169/43  Pulse: (!) 54 (!) 54 60 60  Resp: 18  18 18   Temp: (!) 97.5 F (36.4 C)  97.9 F (36.6 C) 97.7 F (36.5 C)  TempSrc: Oral  Oral Oral  SpO2: 96% 96% 98% 97%  Weight:    151 lb 3.2 oz (68.6 kg)  Height:        Intake/Output Summary (Last 24 hours) at 07/08/17 0729 Last data filed at 07/08/17 0657  Gross per 24 hour  Intake              240 ml  Output             1100 ml  Net             -860 ml   Filed Weights   07/06/17 1209 07/07/17 0415 07/08/17 0445  Weight: 153 lb (69.4 kg) 152 lb 11.2 oz (69.3 kg) 151 lb 3.2 oz (68.6 kg)    Telemetry    SR   - Personally Reviewed  ECG     Physical Exam   GEN: No acute distress.   Neck: No JVD Cardiac: RRR, no murmurs, rubs, or gallops.  Respiratory: Clear to auscultation bilaterally. GI: Soft, nontender, non-distended  MS: No edema; No deformity. Neuro:  Nonfocal  Psych: Normal affect   Labs    Chemistry Recent  Labs Lab 07/06/17 1019 07/07/17 0433 07/08/17 0433  NA 141 140 138  K 3.7 3.6 3.2*  CL 99* 102 99*  CO2 30 29 29   GLUCOSE 197* 133* 124*  BUN 48* 55* 52*  CREATININE 2.78* 2.96* 3.10*  CALCIUM 9.1 9.1 8.9  GFRNONAA 19* 17* 16*  GFRAA 22* 20* 19*  ANIONGAP 12 9 10      Hematology Recent Labs Lab 07/05/17 1517  WBC 8.1  RBC 3.54*  HGB 11.4*  HCT 34.5*  MCV 97.5  MCH 32.2  MCHC 33.0  RDW 13.4  PLT 142*    Cardiac Enzymes Recent Labs Lab 07/06/17 1032  TROPONINI 0.05*    Recent Labs Lab 07/05/17 1533  TROPIPOC 0.02     BNP Recent Labs Lab 07/05/17 1517  BNP 529.6*     DDimer No results for input(s): DDIMER in the last 168 hours.   Radiology  No results found.  Cardiac Studies     Patient Profile     [name] [name] is a 83 y.o. male with a PMHx of CAD, CABG, bioprosthetic AVR, anemia, CKD creatinine 2.2 in 3154, Chronic systolic heart failure EF up and down was 40-45% 2016 Echo. He is not on an ACE or ARB due renal failure.    Assessment & Plan    CAD  S/P CABG  Symptoms not felt to be cardiac    2  Acute on chronic systolic CHF  LVEF 25 to 00%  (prev 40 to 45%_  ON 80 mg bid lasix  PO  K low today  WIll replete K and send home on KCL 20  COntinue Coreg, hydralazine , nitrates  3  CKD  Stage IV  Cr is increased 3.1  Will get bmet this week  May need to back down    4  AVR  Bioprosthesis  Signed, Dorris Carnes, MD  07/08/2017, 7:29 AM

## 2017-07-09 ENCOUNTER — Telehealth: Payer: Self-pay

## 2017-07-09 NOTE — Telephone Encounter (Signed)
Transition Care Management Follow-up Telephone Call  Date discharged? 07/08/2017  How have you been since you were released from the hospital? Still has SOB, no worse than when  in hospital.    Do you understand why you were in the hospital? yes  Do you understand the discharge instructions? yes  Where were you discharged to? Home   Items Reviewed: Medications reviewed: YES Allergies reviewed: YES Dietary changes reviewed: YES Referrals reviewed: YES   Functional Questionnaire:  Independent-I Dependent-D   ADLs:  Dressing- I w/ assist   Eating- I  Maintaining continence- I  Transferring- I  Transportation- I  Meal Prep- D  Managing Meds- D  Any transportation issues/concerns?: No  Any patient concerns? No  Confirmed importance and date/time of follow-up visits scheduled Yes Provider Appointment booked with Sherrie Mustache on 07/10/2017 at 10:45am at [facility]  Confirmed with patient if condition begins to worsen call PCP or go to the ER.  Patient was given the office number and encouraged to call back with question or concerns.  : YES

## 2017-07-10 ENCOUNTER — Encounter: Payer: Self-pay | Admitting: Nurse Practitioner

## 2017-07-10 ENCOUNTER — Ambulatory Visit (INDEPENDENT_AMBULATORY_CARE_PROVIDER_SITE_OTHER): Payer: Medicare Other | Admitting: Nurse Practitioner

## 2017-07-10 VITALS — BP 136/88 | HR 63 | Temp 97.9°F | Resp 17 | Ht 69.0 in | Wt 155.2 lb

## 2017-07-10 DIAGNOSIS — I25110 Atherosclerotic heart disease of native coronary artery with unstable angina pectoris: Secondary | ICD-10-CM

## 2017-07-10 DIAGNOSIS — I5043 Acute on chronic combined systolic (congestive) and diastolic (congestive) heart failure: Secondary | ICD-10-CM | POA: Diagnosis not present

## 2017-07-10 DIAGNOSIS — E118 Type 2 diabetes mellitus with unspecified complications: Secondary | ICD-10-CM | POA: Diagnosis not present

## 2017-07-10 DIAGNOSIS — N184 Chronic kidney disease, stage 4 (severe): Secondary | ICD-10-CM | POA: Diagnosis not present

## 2017-07-10 NOTE — Patient Instructions (Addendum)
Make sure to get lab work at Cardiologist on 8/2  Cont to weigh daily, notify if weight conts go upwards  Keep appt with Cardiologist Aug 14th, sooner if needed

## 2017-07-10 NOTE — Progress Notes (Signed)
Careteam: Patient Care Team: Gayland Curry, DO as PCP - General (Geriatric Medicine) Verl Blalock, Marijo Conception, MD (Inactive) (Cardiology) Gayland Curry, DO as Resident (Geriatric Medicine) Kinsinger, Arta Bruce, MD as Consulting Physician (General Surgery) Luberta Mutter, MD as Consulting Physician (Ophthalmology)  Advanced Directive information Does Patient Have a Medical Advance Directive?: Yes, Type of Advance Directive: Living will;Out of facility DNR (pink MOST or yellow form)  Allergies  Allergen Reactions  . Statins Other (See Comments)    Pain/weakness in legs  . Ambien [Zolpidem Tartrate] Anxiety    Chief Complaint  Patient presents with  . Transitions Of Care    Pt was recently discharged from [facility] after stay from 07/05/17 to 07/08/17 due to CHF.   Marland Kitchen Other    Patient's wife, Angelita Ingles, in room     HPI: Patient is a 83 y.o. male seen in the office today for hospital follow up. PMHx of CAD, CABG, bioprosthetic AVR, anemia, CKD, and chronic systolic heart failure.  He was recently admitted 06/25/17-7/17-18 with CHF. His EF was 25-30% and his prosthetic AOV showed no stenosis by echo 06/26/17. He was discharged then re-admitted 07/05/17 with acute on chronic CHF. He was having pressure and pain in his chest. This has now resolved. His Lasix dose was increased and changed to IV for a few doses. His SCr drifted up from 2.65 to 3.10. Pt has follow up scheduled with cardiologist at heart failure clinic and BMP on 8/2. Weighing himself at home. Yesterday weight was 146 lb and today 149 lbs. His baseline weight was 149 lb per wife.  Shortness of breath is at baseline, no increase in this.  No swelling in LE.  Going to cardiologist in 2 days for follow up lab work.  Appetite has been good.  Blood sugars stable.   Wife reports hospital gave him Ambien which was not needed, he was very out of it after he took the medication. Having hallucinations. Wife stated he did not even need it.    Feels like strength and gait is baseline, not getting home health.  Review of Systems:  Review of Systems  Constitutional: Negative for chills and fever.  HENT: Positive for hearing loss. Negative for congestion.   Eyes: Negative for blurred vision.       Glasses  Respiratory: Positive for shortness of breath (reports this is at baseline. ). Negative for cough and wheezing.   Cardiovascular: Negative for chest pain, palpitations and leg swelling.  Gastrointestinal: Negative for abdominal pain.  Genitourinary: Positive for frequency. Negative for dysuria.       On diuretic  Musculoskeletal: Negative for falls.  Skin: Negative for itching and rash.  Neurological: Negative for dizziness and loss of consciousness.  Endo/Heme/Allergies: Bruises/bleeds easily.  Psychiatric/Behavioral: Positive for memory loss. Negative for depression. The patient is not nervous/anxious and does not have insomnia.     Past Medical History:  Diagnosis Date  . Anemia   . Anxiety   . Aortic stenosis    a. s/p AVR with 51mm Edwards pericardial valve 02/07/12 - post-op course complicated by pleural effusion requring thoracentesis/leg cellulitis 03/2012.  . Baker's cyst 07/23/12   Incidental finding of LE venous dopplers  . Blood transfusion    NO REACTION TO TRANSFUSION  . CAD (coronary artery disease)    a. s/p NSTEMI 12/2011:  LHC - Ostial left main 20%, ostial LAD 50%, mid 60-70%, ostial D1 40% and mid 40%, D2 70%, ostial circumflex occluded, ostial RCA 80-90%,  LVEDP was 42. b.  s/p CABG x 3 at time of AVR (LiMA-LAD, SVG-2nd daigonal, SVG-PDA) 02/07/12 (post-op course noted above).  . Cancer [facility]) '90's   Colon  . Cataract    right eye, hx of  . Cellulitis    a. RLE cellulitis 2 months post-operatively after AVR/CABG - serratia marcessans, tx with I&D/antibiotics  . CHF (congestive heart failure) ([place])   . Chronic kidney disease   . Chronic systolic heart failure ([place])    a. TEE 01/2012: EF 25-30%,  diffuse hypokinesis. b. Not on ACEI due to renal insufficiency.;  c. follow up  echo 08/06/12: EF 25%, mod diast dysfxn, AVR ok, mild MR, mod LAE, mild RAE, mild to mod RV systolic dysfunction  . Diabetes mellitus    borderline  . Flash pulmonary edema ([place])    Post-cath 12/2011, went into acute pulm edema requiring IV lasix and intubation  . GERD (gastroesophageal reflux disease)   . History of colon cancer    s/p colon resection  . HLD (hyperlipidemia)   . HTN (hypertension)    primary, Dr. Hollace Kinnier  . Ischemic cardiomyopathy   . Mitral regurgitation    Moderate by TEE 01/2012  . Myocardial infarction ([place]) 12/2011  . Pleural effusion    a. post-operatively after AVR/CABG s/p thoracentesis 03/2012 yielding 1L serosanguinous fluid.  . Stroke ([place]) 10/01   RIght Leg weakness   Past Surgical History:  Procedure Laterality Date  . AORTIC VALVE REPLACEMENT  02/07/2012   Procedure: AORTIC VALVE REPLACEMENT (AVR);  Surgeon: Gaye Pollack, MD;  Location: [place];  Service: Open Heart Surgery;  Laterality: N/A;  . CARDIAC CATHETERIZATION     1.3.13  stopped breathing, put on ventilator for 5-6 days  . CATARACT EXTRACTION     rt  . CHEST TUBE INSERTION  07/24/2012   Procedure: INSERTION PLEURAL DRAINAGE CATHETER;  Surgeon: Gaye Pollack, MD;  Location: [place];  Service: Thoracic;  Laterality: Left;  . COLON RESECTION  1996  . COLONOSCOPY    . CORONARY ARTERY BYPASS GRAFT  02/07/2012   Procedure: CORONARY ARTERY BYPASS GRAFTING (CABG);  Surgeon: Gaye Pollack, MD;  Location: [place];  Service: Open Heart Surgery;  Laterality: N/A;  CABG x three; using right leg greater saphenous vein harvested endoscopically  . ERCP N/A 07/08/2014   Procedure: ENDOSCOPIC RETROGRADE CHOLANGIOPANCREATOGRAPHY (ERCP);  Surgeon: Missy Sabins, MD;  Location: [facility] ENDOSCOPY;  Service: Endoscopy;  Laterality: N/A;  . ESOPHAGEAL DILATION    . ESOPHAGOGASTRODUODENOSCOPY N/A 07/03/2014   Procedure: ESOPHAGOGASTRODUODENOSCOPY  (EGD);  Surgeon: Arta Silence, MD;  Location: [facility] ENDOSCOPY;  Service: Endoscopy;  Laterality: N/A;  . ESOPHAGOSCOPY WITH DILITATION N/A 07/07/2014   Procedure: ESOPHAGOSCOPY WITH DILITATION/SAVARY DILATOR;  Surgeon: Rozetta Nunnery, MD;  Location: [facility] OR;  Service: ENT;  Laterality: N/A;  . EYE SURGERY  2009   cataract removed from right eye  . INGUINAL HERNIA REPAIR Right 09/13/2015   Procedure: OPEN RIGHT INGUINAL HERNIA;  Surgeon: Mickeal Skinner, MD;  Location: [place];  Service: General;  Laterality: Right;  . INSERTION OF MESH Right 09/13/2015   Procedure: INSERTION OF MESH;  Surgeon: Mickeal Skinner, MD;  Location: [place];  Service: General;  Laterality: Right;  . LEFT HEART CATHETERIZATION WITH CORONARY ANGIOGRAM N/A 12/13/2011   Procedure: LEFT HEART CATHETERIZATION WITH CORONARY ANGIOGRAM;  Surgeon: Peter M Martinique, MD;  Location: [facility] CATH LAB;  Service: Cardiovascular;  Laterality: N/A;  . REMOVAL OF PLEURAL DRAINAGE CATHETER  09/27/2012   Procedure: REMOVAL OF PLEURAL DRAINAGE CATHETER;  Surgeon: Gaye Pollack, MD;  Location: [place];  Service: Thoracic;  Laterality: Left;  . TALC PLEURODESIS  08/30/2012   Procedure: Pietro Cassis;  Surgeon: Gaye Pollack, MD;  Location: [place];  Service: Thoracic;  Laterality: Left;  INSERTION OF TALC VIA LEFT PLEURX  . TALC PLEURODESIS  09/12/2012   Procedure: Pietro Cassis;  Surgeon: Gaye Pollack, MD;  Location: [place];  Service: Thoracic;  Laterality: Left;  . TONSILLECTOMY     Social History:   reports that he has never smoked. He has never used smokeless tobacco. He reports that he does not drink alcohol or use drugs.  Family History  Problem Relation Age of Onset  . Cancer Mother   . Heart attack Father   . Mental illness Brother   . Kidney failure Brother     Medications: Patient's Medications  New Prescriptions   No medications on file  Previous Medications   ACETAMINOPHEN (TYLENOL) 325 MG TABLET    Take 2 tablets  (650 mg total) by mouth every 6 (six) hours as needed for mild pain or headache.   ASPIRIN EC 81 MG EC TABLET    Take 1 tablet (81 mg total) by mouth daily.   CARVEDILOL (COREG) 12.5 MG TABLET    Take 1 tablet (12.5 mg total) by mouth 2 (two) times daily with a meal.   EZETIMIBE (ZETIA) 10 MG TABLET    Take 1 tablet (10 mg total) by mouth at bedtime.   FUROSEMIDE (LASIX) 40 MG TABLET    Take 2 tablets (80 mg total) by mouth 2 (two) times daily.   GLIMEPIRIDE (AMARYL) 1 MG TABLET    TAKE 1MG  BY MOUTH DAILY WITH BREAKFAST   HYDRALAZINE (APRESOLINE) 50 MG TABLET    Take 1 tablet (50 mg total) by mouth 3 (three) times daily.   ISOSORBIDE MONONITRATE (IMDUR) 60 MG 24 HR TABLET    Take 1.5 tablets (90 mg total) by mouth daily.   MULTIPLE VITAMIN (MULTIVITAMIN WITH MINERALS) TABS TABLET    Take 1 tablet by mouth daily.   NITROGLYCERIN (NITROSTAT) 0.4 MG SL TABLET    Place 0.4 mg under the tongue every 5 (five) minutes as needed for chest pain. As needed   OMEPRAZOLE (PRILOSEC) 20 MG CAPSULE    Take 20 mg by mouth daily.    POTASSIUM CHLORIDE SA (K-DUR,KLOR-CON) 20 MEQ TABLET    Take 1 tablet (20 mEq total) by mouth daily.   TAMSULOSIN (FLOMAX) 0.4 MG CAPS CAPSULE    TAKE 0.4MG  BY MOUTH EVERY DAY AFTER DINNER   TRAVOPROST, BAK FREE, (TRAVATAN) 0.004 % SOLN OPHTHALMIC SOLUTION    Place 1 drop into both eyes at bedtime.   Modified Medications   No medications on file  Discontinued Medications   No medications on file     Physical Exam:  Vitals:   07/10/17 1051  BP: 136/88  Pulse: 63  Resp: 17  Temp: 97.9 F (36.6 C)  TempSrc: Oral  SpO2: 98%  Weight: 155 lb 3.2 oz (70.4 kg)  Height: 5\' 9"  (1.753 m)   Body mass index is 22.92 kg/m.  Physical Exam  Constitutional: He is oriented to person, place, and time. He appears well-developed and well-nourished. No distress.  Cardiovascular: Normal rate, regular rhythm and intact distal pulses.   Murmur heard. Pulmonary/Chest: Effort normal and  breath sounds normal. No respiratory distress. He has no wheezes.  Sounds like he  is sob, but denies (chronic)  Abdominal: Bowel sounds are normal.  Musculoskeletal: Normal range of motion.  Neurological: He is alert and oriented to person, place, and time.  Some short term memory loss, wife helps with history  Skin: Skin is warm and dry.   Labs reviewed: Basic Metabolic Panel:  Recent Labs  07/06/17 1019 07/07/17 0433 07/08/17 0433  NA 141 140 138  K 3.7 3.6 3.2*  CL 99* 102 99*  CO2 30 29 29   GLUCOSE 197* 133* 124*  BUN 48* 55* 52*  CREATININE 2.78* 2.96* 3.10*  CALCIUM 9.1 9.1 8.9   Liver Function Tests:  Recent Labs  03/13/17 1000 06/25/17 1114 06/26/17 0056  AST 17 25 22   ALT 14 20 19   ALKPHOS 50 42 36*  BILITOT 0.6 0.6 0.7  PROT 7.2 6.5 6.1*  ALBUMIN 3.9 3.2* 3.2*    Recent Labs  06/25/17 1114  LIPASE 25   No results for input(s): AMMONIA in the last 8760 hours. CBC:  Recent Labs  05/10/17 1300 06/25/17 1114 07/05/17 1517  WBC 6.3 4.9 8.1  NEUTROABS 3.5  --   --   HGB 11.4* 11.1* 11.4*  HCT 35.1* 33.9* 34.5*  MCV 99.7* 100.0 97.5  PLT 124* 104* 142*   Lipid Panel:  Recent Labs  03/13/17 1000  CHOL 166  HDL 33*  LDLCALC 96  TRIG 183*  CHOLHDL 5.0*   TSH: No results for input(s): TSH in the last 8760 hours. A1C: Lab Results  Component Value Date   HGBA1C 5.6 03/13/2017     Assessment/Plan 1. Acute on chronic combined systolic and diastolic CHF (congestive heart failure) ([place]) 2 recent admissions due to acute CHF. Lasix increased to 80 mg BID which has improved symptoms. euvolemic today. Weight slightly up today but without worsening shortness of breath or edema. Will cont current regimen and pt has BMP scheduled on 8/2 with cardiologist and follow up with CHF clinic scheduled.   2. Controlled type 2 diabetes mellitus with complication, without long-term current use of insulin (HCC) CBGS reviewed and stable. Cont current regimen.    3. Chronic kidney disease (CKD), stage IV (severe) ([place]) Renal function trended up during hospitalization with increase in lasix, felt like this may be new baseline, following up labs with cardiologist in 2 days.   4. Atherosclerosis of native coronary artery of native heart with unstable angina pectoris (HCC) Stable, no further chest pain. conts on ASA 81 mg daily   Trejan Buda K. Harle Battiest  [facility] [phone] 8 am - 5 pm) [phone] (after hours)

## 2017-07-11 ENCOUNTER — Emergency Department (HOSPITAL_COMMUNITY)
Admission: EM | Admit: 2017-07-11 | Discharge: 2017-07-11 | Disposition: A | Discharge status: Home / Self Care | Payer: Medicare Other | Attending: Emergency Medicine | Admitting: Emergency Medicine

## 2017-07-11 ENCOUNTER — Emergency Department (HOSPITAL_COMMUNITY): Payer: Medicare Other

## 2017-07-11 ENCOUNTER — Other Ambulatory Visit: Payer: Self-pay

## 2017-07-11 DIAGNOSIS — I5022 Chronic systolic (congestive) heart failure: Secondary | ICD-10-CM | POA: Insufficient documentation

## 2017-07-11 DIAGNOSIS — I130 Hypertensive heart and chronic kidney disease with heart failure and stage 1 through stage 4 chronic kidney disease, or unspecified chronic kidney disease: Secondary | ICD-10-CM | POA: Insufficient documentation

## 2017-07-11 DIAGNOSIS — I252 Old myocardial infarction: Secondary | ICD-10-CM | POA: Insufficient documentation

## 2017-07-11 DIAGNOSIS — R252 Cramp and spasm: Secondary | ICD-10-CM | POA: Insufficient documentation

## 2017-07-11 DIAGNOSIS — E1122 Type 2 diabetes mellitus with diabetic chronic kidney disease: Secondary | ICD-10-CM | POA: Insufficient documentation

## 2017-07-11 DIAGNOSIS — I2510 Atherosclerotic heart disease of native coronary artery without angina pectoris: Secondary | ICD-10-CM | POA: Diagnosis not present

## 2017-07-11 DIAGNOSIS — N184 Chronic kidney disease, stage 4 (severe): Secondary | ICD-10-CM | POA: Diagnosis not present

## 2017-07-11 DIAGNOSIS — R0789 Other chest pain: Secondary | ICD-10-CM | POA: Diagnosis not present

## 2017-07-11 DIAGNOSIS — Z79899 Other long term (current) drug therapy: Secondary | ICD-10-CM | POA: Insufficient documentation

## 2017-07-11 DIAGNOSIS — I25810 Atherosclerosis of coronary artery bypass graft(s) without angina pectoris: Secondary | ICD-10-CM | POA: Diagnosis not present

## 2017-07-11 DIAGNOSIS — Z7982 Long term (current) use of aspirin: Secondary | ICD-10-CM | POA: Insufficient documentation

## 2017-07-11 DIAGNOSIS — R0602 Shortness of breath: Secondary | ICD-10-CM | POA: Diagnosis not present

## 2017-07-11 DIAGNOSIS — R079 Chest pain, unspecified: Secondary | ICD-10-CM | POA: Diagnosis not present

## 2017-07-11 LAB — BASIC METABOLIC PANEL
ANION GAP: 10 (ref 5–15)
BUN: 64 mg/dL — ABNORMAL HIGH (ref 6–20)
CALCIUM: 9.3 mg/dL (ref 8.9–10.3)
CO2: 28 mmol/L (ref 22–32)
Chloride: 102 mmol/L (ref 101–111)
Creatinine, Ser: 3.13 mg/dL — ABNORMAL HIGH (ref 0.61–1.24)
GFR calc Af Amer: 19 mL/min — ABNORMAL LOW (ref >60)
GFR, EST NON AFRICAN AMERICAN: 16 mL/min — AB (ref >60)
GLUCOSE: 125 mg/dL — AB (ref 65–99)
Potassium: 4.6 mmol/L (ref 3.5–5.1)
SODIUM: 140 mmol/L (ref 135–145)

## 2017-07-11 LAB — CBC WITH DIFFERENTIAL/PLATELET
BASOS ABS: 0 10*3/uL (ref 0.0–0.1)
BASOS PCT: 0 %
EOS ABS: 0.1 10*3/uL (ref 0.0–0.7)
EOS PCT: 1 %
HCT: 36.1 % — ABNORMAL LOW (ref 39.0–52.0)
Hemoglobin: 11.9 g/dL — ABNORMAL LOW (ref 13.0–17.0)
LYMPHS PCT: 45 %
Lymphs Abs: 2.7 10*3/uL (ref 0.7–4.0)
MCH: 32.3 pg (ref 26.0–34.0)
MCHC: 33 g/dL (ref 30.0–36.0)
MCV: 98.1 fL (ref 78.0–100.0)
MONO ABS: 0.4 10*3/uL (ref 0.1–1.0)
Monocytes Relative: 7 %
Neutro Abs: 2.9 10*3/uL (ref 1.7–7.7)
Neutrophils Relative %: 47 %
PLATELETS: 93 10*3/uL — AB (ref 150–400)
RBC: 3.68 MIL/uL — ABNORMAL LOW (ref 4.22–5.81)
RDW: 13.2 % (ref 11.5–15.5)
WBC: 6.1 10*3/uL (ref 4.0–10.5)

## 2017-07-11 LAB — BRAIN NATRIURETIC PEPTIDE: B NATRIURETIC PEPTIDE 5: 281.4 pg/mL — AB (ref 0.0–100.0)

## 2017-07-11 LAB — TROPONIN I: TROPONIN I: 0.03 ng/mL — AB (ref <0.03)

## 2017-07-11 NOTE — ED Notes (Signed)
ED Provider at bedside. 

## 2017-07-11 NOTE — ED Notes (Signed)
Dr. Venora Maples still at the bedside.

## 2017-07-11 NOTE — ED Triage Notes (Signed)
Patient comes in frm home with cp, 10/10 throbbing pain that started suddenly at 12:30 while eating lunch. Non radiating. Sob noted. Shallow breaths. C/o near syncope. Denies n/v/sweatiness. ekg LVH noted. Hx CHF, KD. Patient took nitro x3 and EMS gave 1. 324mg  aspirin taken. 20 LAC. V/s 122/68, 60 HR, 26 RR, 96% RA, 175 cbg. A/ox4.

## 2017-07-11 NOTE — ED Provider Notes (Signed)
[facility] DEPT Provider Note   CSN: [id number] Arrival date & time: 07/11/17  1709     History   Chief Complaint No chief complaint on file.   HPI [name] [name] is a 83 y.o. male.  HPI   83 year old male with history of CAD status post CABG, anemia, CAD, chronic systolic heart failure (EF 25-30%) presenting to the ED for evaluation of chest pain.  Patient was recently admitted to the hospital on 7/26 for acute on chronic CHF, discharged on 7/29. His laxis was increased, his beta blocker was decreased during hospitalization.  He is here today due to having recurrent left-sided chest pain. It is difficult to describe sensation but he reports having vague flapping sensation primary to his left chest with associated shortness of breath and muscle spasm. This episode started after he finished with his lunch and while he was resting. He has last for the past several hours, waxing and waning but currently mild. This is the same chest discomfort that he has been experienced for the past 2 weeks, and also during his hospitalization. He was given Xanax in the hospital for an which seems to help. He does not have any Xanax at home. No report of productive cough, arm pain negative jaw pain, abdominal or back pain. He has been active and walking around at home since discharge. He weighs himself daily and has dropped at least 3 pounds since his Lasix was increased to a max dose. Report remote history of MI but unable to compare the sensation was prior MI because "it was so long ago"  Past Medical History:  Diagnosis Date  . Anemia   . Anxiety   . Aortic stenosis    a. s/p AVR with 66mm Edwards pericardial valve 02/07/12 - post-op course complicated by pleural effusion requring thoracentesis/leg cellulitis 03/2012.  . Baker's cyst 07/23/12   Incidental finding of LE venous dopplers  . Blood transfusion    NO REACTION TO TRANSFUSION  . CAD (coronary artery disease)    a. s/p NSTEMI 12/2011:  LHC -  Ostial left main 20%, ostial LAD 50%, mid 60-70%, ostial D1 40% and mid 40%, D2 70%, ostial circumflex occluded, ostial RCA 80-90%, LVEDP was 42. b.  s/p CABG x 3 at time of AVR (LiMA-LAD, SVG-2nd daigonal, SVG-PDA) 02/07/12 (post-op course noted above).  . Cancer [facility]) '90's   Colon  . Cataract    right eye, hx of  . Cellulitis    a. RLE cellulitis 2 months post-operatively after AVR/CABG - serratia marcessans, tx with I&D/antibiotics  . CHF (congestive heart failure) ([place])   . Chronic kidney disease   . Chronic systolic heart failure ([place])    a. TEE 01/2012: EF 25-30%, diffuse hypokinesis. b. Not on ACEI due to renal insufficiency.;  c. follow up  echo 08/06/12: EF 25%, mod diast dysfxn, AVR ok, mild MR, mod LAE, mild RAE, mild to mod RV systolic dysfunction  . Diabetes mellitus    borderline  . Flash pulmonary edema ([place])    Post-cath 12/2011, went into acute pulm edema requiring IV lasix and intubation  . GERD (gastroesophageal reflux disease)   . History of colon cancer    s/p colon resection  . HLD (hyperlipidemia)   . HTN (hypertension)    primary, Dr. Hollace Kinnier  . Ischemic cardiomyopathy   . Mitral regurgitation    Moderate by TEE 01/2012  . Myocardial infarction ([place]) 12/2011  . Pleural effusion    a. post-operatively after AVR/CABG s/p  thoracentesis 03/2012 yielding 1L serosanguinous fluid.  . Stroke ([place]) 10/01   RIght Leg weakness    Patient Active Problem List   Diagnosis Date Noted  . Acute on chronic combined systolic and diastolic CHF (congestive heart failure) ([place]) 07/06/2017  . Acute on chronic systolic (congestive) heart failure ([place]) 06/25/2017  . Chest pain 09/20/2015  . Diabetes mellitus type 2, controlled, with complications ([place]) 41/66/0630  . S/P AVR (aortic valve replacement) 08/18/2013  . S/P CABG x 3: LIMA-LAD, SVG-D2, SVG-rPDA 09/17/2012  . Atherosclerosis of native coronary artery-- s/p CABG 01/11/2012  . Chronic combined systolic and diastolic heart  failure, NYHA class 2 ([place]) 01/11/2012  . Ischemic cardiomyopathy 01/11/2012  . Chronic kidney disease (CKD), stage IV (severe) ([place]) 01/11/2012  . HLD (hyperlipidemia) 01/11/2012  . Acute on chronic renal failure ([place]) 12/13/2011  . Anemia due to chronic kidney disease 12/13/2011  . Hypertensive heart disease without CHF 02/03/2009    Past Surgical History:  Procedure Laterality Date  . AORTIC VALVE REPLACEMENT  02/07/2012   Procedure: AORTIC VALVE REPLACEMENT (AVR);  Surgeon: Gaye Pollack, MD;  Location: [facility];  Service: Open Heart Surgery;  Laterality: N/A;  . CARDIAC CATHETERIZATION     1.3.13  stopped breathing, put on ventilator for 5-6 days  . CATARACT EXTRACTION     rt  . CHEST TUBE INSERTION  07/24/2012   Procedure: INSERTION PLEURAL DRAINAGE CATHETER;  Surgeon: Gaye Pollack, MD;  Location: [facility];  Service: Thoracic;  Laterality: Left;  . COLON RESECTION  1996  . COLONOSCOPY    . CORONARY ARTERY BYPASS GRAFT  02/07/2012   Procedure: CORONARY ARTERY BYPASS GRAFTING (CABG);  Surgeon: Gaye Pollack, MD;  Location: [facility];  Service: Open Heart Surgery;  Laterality: N/A;  CABG x three; using right leg greater saphenous vein harvested endoscopically  . ERCP N/A 07/08/2014   Procedure: ENDOSCOPIC RETROGRADE CHOLANGIOPANCREATOGRAPHY (ERCP);  Surgeon: Missy Sabins, MD;  Location: [facility] ENDOSCOPY;  Service: Endoscopy;  Laterality: N/A;  . ESOPHAGEAL DILATION    . ESOPHAGOGASTRODUODENOSCOPY N/A 07/03/2014   Procedure: ESOPHAGOGASTRODUODENOSCOPY (EGD);  Surgeon: Arta Silence, MD;  Location: [facility] ENDOSCOPY;  Service: Endoscopy;  Laterality: N/A;  . ESOPHAGOSCOPY WITH DILITATION N/A 07/07/2014   Procedure: ESOPHAGOSCOPY WITH DILITATION/SAVARY DILATOR;  Surgeon: Rozetta Nunnery, MD;  Location: [facility] OR;  Service: ENT;  Laterality: N/A;  . EYE SURGERY  2009   cataract removed from right eye  . INGUINAL HERNIA REPAIR Right 09/13/2015   Procedure: OPEN RIGHT INGUINAL HERNIA;  Surgeon: Mickeal Skinner, MD;  Location: [facility];  Service: General;  Laterality: Right;  . INSERTION OF MESH Right 09/13/2015   Procedure: INSERTION OF MESH;  Surgeon: Mickeal Skinner, MD;  Location: [facility];  Service: General;  Laterality: Right;  . LEFT HEART CATHETERIZATION WITH CORONARY ANGIOGRAM N/A 12/13/2011   Procedure: LEFT HEART CATHETERIZATION WITH CORONARY ANGIOGRAM;  Surgeon: Peter M Martinique, MD;  Location[facility] CATH LAB;  Service: Cardiovascular;  Laterality: N/A;  . REMOVAL OF PLEURAL DRAINAGE CATHETER  09/27/2012   Procedure: REMOVAL OF PLEURAL DRAINAGE CATHETER;  Surgeon: Gaye Pollack, MD;  Location: [facility];  Service: Thoracic;  Laterality: Left;  . TALC PLEURODESIS  08/30/2012   Procedure: Pietro Cassis;  Surgeon: Gaye Pollack, MD;  Location: [facility];  Service: Thoracic;  Laterality: Left;  INSERTION OF TALC VIA LEFT PLEURX  . TALC PLEURODESIS  09/12/2012   Procedure: Pietro Cassis;  Surgeon: Gaye Pollack, MD;  Location:  [facility] OR;  Service: Thoracic;  Laterality: Left;  . TONSILLECTOMY         Home Medications    Prior to Admission medications   Medication Sig Start Date End Date Taking? Authorizing Provider  acetaminophen (TYLENOL) 325 MG tablet Take 2 tablets (650 mg total) by mouth every 6 (six) hours as needed for mild pain or headache. 07/08/17   Erlene Quan, PA-C  aspirin EC 81 MG EC tablet Take 1 tablet (81 mg total) by mouth daily. 06/27/17   Shirley Friar, PA-C  carvedilol (COREG) 12.5 MG tablet Take 1 tablet (12.5 mg total) by mouth 2 (two) times daily with a meal. 06/26/17   Tillery, Satira Mccallum, PA-C  ezetimibe (ZETIA) 10 MG tablet Take 1 tablet (10 mg total) by mouth at bedtime. 06/26/17   Shirley Friar, PA-C  furosemide (LASIX) 40 MG tablet Take 2 tablets (80 mg total) by mouth 2 (two) times daily. 07/08/17   Erlene Quan, PA-C  glimepiride (AMARYL) 1 MG tablet TAKE 1MG  BY MOUTH DAILY WITH BREAKFAST 07/08/17   Kerin Ransom K, PA-C  hydrALAZINE  (APRESOLINE) 50 MG tablet Take 1 tablet (50 mg total) by mouth 3 (three) times daily. 06/26/17   Shirley Friar, PA-C  isosorbide mononitrate (IMDUR) 60 MG 24 hr tablet Take 1.5 tablets (90 mg total) by mouth daily. 07/08/17   Erlene Quan, PA-C  Multiple Vitamin (MULTIVITAMIN WITH MINERALS) TABS tablet Take 1 tablet by mouth daily.    [provider]  nitroGLYCERIN (NITROSTAT) 0.4 MG SL tablet Place 0.4 mg under the tongue every 5 (five) minutes as needed for chest pain. As needed    [provider]  omeprazole (PRILOSEC) 20 MG capsule Take 20 mg by mouth daily.     [provider]  potassium chloride SA (K-DUR,KLOR-CON) 20 MEQ tablet Take 1 tablet (20 mEq total) by mouth daily. 07/09/17   Erlene Quan, PA-C  tamsulosin (FLOMAX) 0.4 MG CAPS capsule TAKE 0.4MG  BY MOUTH EVERY DAY AFTER DINNER 07/08/17   Kerin Ransom K, PA-C  Travoprost, BAK Free, (TRAVATAN) 0.004 % SOLN ophthalmic solution Place 1 drop into both eyes at bedtime.     [provider]    Family History Family History  Problem Relation Age of Onset  . Cancer Mother   . Heart attack Father   . Mental illness Brother   . Kidney failure Brother     Social History Social History  Substance Use Topics  . Smoking status: Never Smoker  . Smokeless tobacco: Never Used  . Alcohol use No     Allergies   Statins and Ambien [zolpidem tartrate]   Review of Systems Review of Systems  All other systems reviewed and are negative.    Physical Exam Updated Vital Signs There were no vitals taken for this visit.  Physical Exam  Constitutional: He appears well-developed and well-nourished. No distress.  Elderly male laying in bed in no acute discomfort.  HENT:  Head: Atraumatic.  Eyes: Conjunctivae are normal.  Neck: Neck supple. JVD present.  Cardiovascular: Normal rate, regular rhythm and intact distal pulses.  Exam reveals no gallop and no friction rub.   No murmur  heard. Pulmonary/Chest: Effort normal and breath sounds normal. No respiratory distress. He has no wheezes. He has no rales. He exhibits no tenderness.  Abdominal: Soft. He exhibits no distension. There is no tenderness.  Musculoskeletal: He exhibits no edema.  Neurological: He is alert.  Skin: No rash  noted.  Psychiatric: He has a normal mood and affect.  Nursing note and vitals reviewed.    ED Treatments / Results  Labs (all labs ordered are listed, but only abnormal results are displayed) Labs Reviewed  TROPONIN I - Abnormal; Notable for the following:       Result Value   Troponin I 0.03 (*)    All other components within normal limits  BRAIN NATRIURETIC PEPTIDE - Abnormal; Notable for the following:    B Natriuretic Peptide 281.4 (*)    All other components within normal limits  CBC WITH DIFFERENTIAL/PLATELET - Abnormal; Notable for the following:    RBC 3.68 (*)    Hemoglobin 11.9 (*)    HCT 36.1 (*)    Platelets 93 (*)    All other components within normal limits  BASIC METABOLIC PANEL - Abnormal; Notable for the following:    Glucose, Bld 125 (*)    BUN 64 (*)    Creatinine, Ser 3.13 (*)    GFR calc non Af Amer 16 (*)    GFR calc Af Amer 19 (*)    All other components within normal limits    EKG  EKG Interpretation  Date/Time:  Wednesday July 11 2017 17:14:25 EDT Ventricular Rate:  60 PR Interval:    QRS Duration: 109 QT Interval:  462 QTC Calculation: 462 R Axis:   -53 Text Interpretation:  Sinus or ectopic atrial rhythm Prolonged PR interval Left anterior fascicular block Abnormal R-wave progression, late transition LVH with secondary repolarization abnormality No significant change was found Confirmed by Jola Schmidt [phone]) on 07/11/2017 5:58:36 PM       Radiology Dg Chest Portable 1 View  Result Date: 07/11/2017 CLINICAL DATA:  83 year old male with a history of left-sided chest pain EXAM: PORTABLE CHEST 1 VIEW COMPARISON:  07/05/2017 FINDINGS:  Cardiomediastinal silhouette unchanged in size and contour. Surgical changes of prior median sternotomy, CABG, aortic valve replacement. Coarsened interstitial markings, similar to prior. No interlobular septal thickening or central vascular congestion. No pneumothorax or pleural effusion.  No confluent airspace disease. IMPRESSION: No radiographic evidence of acute cardiopulmonary disease. Surgical changes of prior median sternotomy, CABG, aortic valve repair Electronically Signed   By: Corrie Mckusick D.O.   On: 07/11/2017 18:13    Procedures Procedures (including critical care time)  Medications Ordered in ED Medications - No data to display   Initial Impression / Assessment and Plan / ED Course  I have reviewed the triage vital signs and the nursing notes.  Pertinent labs & imaging results that were available during my care of the patient were reviewed by me and considered in my medical decision making (see chart for details).     BP (!) 139/54   Pulse (!) 57   Temp 97.7 F (36.5 C) (Oral)   Resp 19   Ht 5\' 9"  (1.753 m)   Wt 70.3 kg (155 lb)   SpO2 95%   BMI 22.89 kg/m    Final Clinical Impressions(s) / ED Diagnoses   Final diagnoses:  Spasm  Atypical chest pain    New Prescriptions New Prescriptions   No medications on file   5:35 PM Patient with left-sided chest pain short of breath. Recently discharged from hospital 2 days ago after being evaluated and treated for acute on chronic systolic heart failure with an EF of 25-30%. He is on max dose of Lasix currently. It was felt that his chest pain is atypical for ACS, low suspicion for PE. His  pain is currently minimal, he does have some muscle spasm on my evaluation which may contribute to his symptoms. Plan to check his electrolytes as patient is currently taking Lasix and has had an increased risk of having electrolytes derangement. Will check EKG, troponin, chest x-ray, and basic labs.  8:05 PM Mildly elevated  troponin of 0.03, an improvement from several days ago. His BNP is 281, also an improvement from several days prior. Labs are reassuring, potassium is 4.6, normalized from prior values. Evidence of chronic kidney disease, unchanged. Chest x-ray shows no significant fluid retention or other concerning feature. While in the ER, patient states he feels better. He is hemodynamically stable. At this time, I felt the patient is stable enough to follow-up with his primary care provider. Care discussed with Dr. Venora Maples.    Domenic Moras, PA-C 07/11/17 2018    Jola Schmidt, MD 07/11/17 [phone]

## 2017-07-11 NOTE — Discharge Instructions (Signed)
Please call and follow up closely with your primary care provider for further evaluation of your condition.  Return if you have any concerns.  °

## 2017-07-11 NOTE — ED Notes (Signed)
Pt. Ambulated to restroom with RN. Pt. Feels unsteady on feet. RN remained with patient while he used the restroom then walked him back. Pt. Placed back on monitor.

## 2017-07-12 ENCOUNTER — Ambulatory Visit: Payer: Self-pay | Admitting: Internal Medicine

## 2017-07-12 ENCOUNTER — Telehealth (HOSPITAL_COMMUNITY): Payer: Self-pay | Admitting: *Deleted

## 2017-07-12 MED ORDER — ISOSORBIDE MONONITRATE ER 60 MG PO TB24: 120.0000 mg | ORAL_TABLET | Freq: Every day | ORAL | 3 refills | Status: AC

## 2017-07-12 NOTE — Telephone Encounter (Signed)
-----   Message from Larey Dresser, MD sent at 07/11/2017  9:48 PM EDT ----- Please have Mr Forcier increase Imdur to 120 mg daily and have him followup in office with me in the next week.   ----- Message ----- From: Jola Schmidt, MD Sent: 07/11/2017   8:53 PM To: Larey Dresser, MD, Domenic Moras, PA-C  [place]-  I saw your patient in the emergency department tonight for recurrent chest pain with associated shortness of breath.  His EKG was without ischemic changes in his troponin was 0.03.  He had resolution of his discomfort while in the ER.  Symptoms however do sound concerning for unstable angina although it sounds like this a difficult situation given his chronic renal insufficiency.  The family had multiple questions for me in regards to what to do in the future when he has recurrent chest pain and what their options are for his recurrent chest pain and possible unstable angina.  There were hoping you or someone from the cardiology office could reach out to them tomorrow to provide additional guidance and answer a few additional questions.  I asked them who the best point contact would be and they stated it would be the patient's daughter Malachy Mood whose contact information is in the electronic medical record  Thanks,  Lennette Bihari

## 2017-07-12 NOTE — Telephone Encounter (Signed)
Called and spoke with [name] [name] and she will have him increase his imdur to 120 mg (2 Tablets) Daily and we have scheduled him to see Dr. Aundra Dubin next week.

## 2017-07-17 ENCOUNTER — Encounter (HOSPITAL_COMMUNITY): Payer: Self-pay | Admitting: Cardiology

## 2017-07-17 ENCOUNTER — Ambulatory Visit (HOSPITAL_COMMUNITY)
Admission: RE | Admit: 2017-07-17 | Discharge: 2017-07-17 | Disposition: A | Discharge status: Home / Self Care | Payer: Medicare Other | Source: Ambulatory Visit | Attending: Cardiology | Admitting: Cardiology

## 2017-07-17 VITALS — BP 138/51 | HR 60 | Wt 154.8 lb

## 2017-07-17 DIAGNOSIS — J90 Pleural effusion, not elsewhere classified: Secondary | ICD-10-CM | POA: Insufficient documentation

## 2017-07-17 DIAGNOSIS — Z8249 Family history of ischemic heart disease and other diseases of the circulatory system: Secondary | ICD-10-CM | POA: Insufficient documentation

## 2017-07-17 DIAGNOSIS — N179 Acute kidney failure, unspecified: Secondary | ICD-10-CM | POA: Diagnosis not present

## 2017-07-17 DIAGNOSIS — Z809 Family history of malignant neoplasm, unspecified: Secondary | ICD-10-CM | POA: Insufficient documentation

## 2017-07-17 DIAGNOSIS — Z888 Allergy status to other drugs, medicaments and biological substances status: Secondary | ICD-10-CM | POA: Diagnosis not present

## 2017-07-17 DIAGNOSIS — I350 Nonrheumatic aortic (valve) stenosis: Secondary | ICD-10-CM | POA: Diagnosis not present

## 2017-07-17 DIAGNOSIS — I340 Nonrheumatic mitral (valve) insufficiency: Secondary | ICD-10-CM | POA: Insufficient documentation

## 2017-07-17 DIAGNOSIS — E1122 Type 2 diabetes mellitus with diabetic chronic kidney disease: Secondary | ICD-10-CM | POA: Insufficient documentation

## 2017-07-17 DIAGNOSIS — I252 Old myocardial infarction: Secondary | ICD-10-CM | POA: Diagnosis not present

## 2017-07-17 DIAGNOSIS — Z9889 Other specified postprocedural states: Secondary | ICD-10-CM | POA: Insufficient documentation

## 2017-07-17 DIAGNOSIS — Z952 Presence of prosthetic heart valve: Secondary | ICD-10-CM

## 2017-07-17 DIAGNOSIS — N184 Chronic kidney disease, stage 4 (severe): Secondary | ICD-10-CM | POA: Insufficient documentation

## 2017-07-17 DIAGNOSIS — Z953 Presence of xenogenic heart valve: Secondary | ICD-10-CM | POA: Insufficient documentation

## 2017-07-17 DIAGNOSIS — N183 Chronic kidney disease, stage 3 unspecified: Secondary | ICD-10-CM

## 2017-07-17 DIAGNOSIS — Z7982 Long term (current) use of aspirin: Secondary | ICD-10-CM | POA: Diagnosis not present

## 2017-07-17 DIAGNOSIS — H269 Unspecified cataract: Secondary | ICD-10-CM | POA: Diagnosis not present

## 2017-07-17 DIAGNOSIS — Z818 Family history of other mental and behavioral disorders: Secondary | ICD-10-CM | POA: Diagnosis not present

## 2017-07-17 DIAGNOSIS — I2510 Atherosclerotic heart disease of native coronary artery without angina pectoris: Secondary | ICD-10-CM | POA: Diagnosis not present

## 2017-07-17 DIAGNOSIS — I255 Ischemic cardiomyopathy: Secondary | ICD-10-CM | POA: Diagnosis not present

## 2017-07-17 DIAGNOSIS — I5042 Chronic combined systolic (congestive) and diastolic (congestive) heart failure: Secondary | ICD-10-CM | POA: Diagnosis not present

## 2017-07-17 DIAGNOSIS — K219 Gastro-esophageal reflux disease without esophagitis: Secondary | ICD-10-CM | POA: Diagnosis not present

## 2017-07-17 DIAGNOSIS — Z8673 Personal history of transient ischemic attack (TIA), and cerebral infarction without residual deficits: Secondary | ICD-10-CM | POA: Diagnosis not present

## 2017-07-17 DIAGNOSIS — Z9049 Acquired absence of other specified parts of digestive tract: Secondary | ICD-10-CM | POA: Insufficient documentation

## 2017-07-17 DIAGNOSIS — Z841 Family history of disorders of kidney and ureter: Secondary | ICD-10-CM | POA: Insufficient documentation

## 2017-07-17 DIAGNOSIS — D631 Anemia in chronic kidney disease: Secondary | ICD-10-CM | POA: Diagnosis not present

## 2017-07-17 DIAGNOSIS — Z85038 Personal history of other malignant neoplasm of large intestine: Secondary | ICD-10-CM | POA: Diagnosis not present

## 2017-07-17 DIAGNOSIS — E785 Hyperlipidemia, unspecified: Secondary | ICD-10-CM | POA: Diagnosis not present

## 2017-07-17 DIAGNOSIS — I5022 Chronic systolic (congestive) heart failure: Secondary | ICD-10-CM | POA: Insufficient documentation

## 2017-07-17 DIAGNOSIS — I130 Hypertensive heart and chronic kidney disease with heart failure and stage 1 through stage 4 chronic kidney disease, or unspecified chronic kidney disease: Secondary | ICD-10-CM | POA: Insufficient documentation

## 2017-07-17 DIAGNOSIS — Z7984 Long term (current) use of oral hypoglycemic drugs: Secondary | ICD-10-CM | POA: Insufficient documentation

## 2017-07-17 LAB — BASIC METABOLIC PANEL
ANION GAP: 10 (ref 5–15)
BUN: 56 mg/dL — ABNORMAL HIGH (ref 6–20)
CALCIUM: 9.5 mg/dL (ref 8.9–10.3)
CO2: 29 mmol/L (ref 22–32)
CREATININE: 2.83 mg/dL — AB (ref 0.61–1.24)
Chloride: 99 mmol/L — ABNORMAL LOW (ref 101–111)
GFR, EST AFRICAN AMERICAN: 21 mL/min — AB (ref >60)
GFR, EST NON AFRICAN AMERICAN: 18 mL/min — AB (ref >60)
Glucose, Bld: 224 mg/dL — ABNORMAL HIGH (ref 65–99)
Potassium: 4.6 mmol/L (ref 3.5–5.1)
Sodium: 138 mmol/L (ref 135–145)

## 2017-07-17 NOTE — Patient Instructions (Signed)
Lab today  Your physician recommends that you schedule a follow-up appointment in: 2 weeks with PA  Your physician recommends that you schedule a follow-up appointment in: 1 month with Dr Aundra Dubin  **PLEASE CALL [phone] OPT 5 TO [place], if you do not receive a call back you call back to [phone] and select either opt 1 or opt 3 and let them know you have left a message and are getting a call back and it is an urgent issue.

## 2017-07-17 NOTE — Progress Notes (Signed)
Patient ID: Wrangler Penning, male   DOB: Jul 02, 1926, 83 y.o.   MRN: [id number]     Advanced Heart Failure Clinic Note   PCP: Hollace Kinnier HF: Dr. Aundra Dubin   HPI: Mr Abdalla is a 83 y.o. male with a PMHx of CAD, CABG, bioprosthetic AVR, anemia, CKD stage 4, chronic systolic heart failure. He is not on an ACE or ARB due renal failure.   Admitted to [facility] 4/28 through 04/15/13  with volume overload. Diuresed with IV lasix. He was not discharged on diuretics. Discharge weight 139 pounds.   He had a hernia repair in 10/16 and developed volume overload after.  Lasix was increased but he had progressive dyspnea.  He was readmitted and found to have AKI with anemia and HCAP.  He was treated for PNA and transfused 2 units PRBCs.  He was discharged to short-term rehab and remains there today.   Echo in 10/16 showed EF 40-45%, inferior/inferolateral/inferoseptal akinesis, bioprosthetic AoV with mean gradient 12 mmHg, RV with moderately decreased systolic function, PASP 54 mmHg.   He was admitted in 11/16 with aspiration PNA after inguinal hernia surgery.   He was admitted twice in 7/18, both times with sudden onset dyspnea and chest pressure.  TnI was minimally elevated (0.03-0.05 range).  BNP was elevated.  He improved rapidly with IV diuresis.  V/Q scan was negative.  Echo in 7/18 showed EF 25-30%.  Lasix was increased from 40 mg bid to 60 mg bid then to 80 mg bid.    He presents for hospital followup.  No more acute dyspneic events.  He walks on flat ground without shortness of breath.  No orthopnea/PND.  Weight is down 5 lbs compared to prior appointment and has been stable at home.  No chest pain/pressure.  No palpitations.   Labs: 05/28/13 Creatinine 2.37 Potassium 4.3 7/14 Creatinine 2.2 3/15 HCT 35.9 10/16 K 3.5, creatinine 2.82, HCT 29.6 11/16 K 4.2, creatinine 2.2, HCT 33.3, plts 127 4/18 K 4.8, creatinine 2.38 8/18 K 4.6, creatinine 3.13  Past Medical History:  Diagnosis Date  . Anemia   .  Anxiety   . Aortic stenosis    a. s/p AVR with 23mm Edwards pericardial valve 02/07/12 - post-op course complicated by pleural effusion requring thoracentesis/leg cellulitis 03/2012.  . Baker's cyst 07/23/12   Incidental finding of LE venous dopplers  . Blood transfusion    NO REACTION TO TRANSFUSION  . CAD (coronary artery disease)    a. s/p NSTEMI 12/2011:  LHC - Ostial left main 20%, ostial LAD 50%, mid 60-70%, ostial D1 40% and mid 40%, D2 70%, ostial circumflex occluded, ostial RCA 80-90%, LVEDP was 42. b.  s/p CABG x 3 at time of AVR (LiMA-LAD, SVG-2nd daigonal, SVG-PDA) 02/07/12 (post-op course noted above).  . Cancer [facility] '90's   Colon  . Cataract    right eye, hx of  . Cellulitis    a. RLE cellulitis 2 months post-operatively after AVR/CABG - serratia marcessans, tx with I&D/antibiotics  . CHF (congestive heart failure) ([place])   . Chronic kidney disease   . Chronic systolic heart failure ([place])    a. TEE 01/2012: EF 25-30%, diffuse hypokinesis. b. Not on ACEI due to renal insufficiency.;  c. follow up  echo 08/06/12: EF 25%, mod diast dysfxn, AVR ok, mild MR, mod LAE, mild RAE, mild to mod RV systolic dysfunction  . Diabetes mellitus    borderline  . Flash pulmonary edema ([place])    Post-cath 12/2011, went into acute  pulm edema requiring IV lasix and intubation  . GERD (gastroesophageal reflux disease)   . History of colon cancer    s/p colon resection  . HLD (hyperlipidemia)   . HTN (hypertension)    primary, Dr. Hollace Kinnier  . Ischemic cardiomyopathy   . Mitral regurgitation    Moderate by TEE 01/2012  . Myocardial infarction ([place]) 12/2011  . Pleural effusion    a. post-operatively after AVR/CABG s/p thoracentesis 03/2012 yielding 1L serosanguinous fluid.  . Stroke ([place]) 10/01   RIght Leg weakness    Current Outpatient Prescriptions  Medication Sig Dispense Refill  . acetaminophen (TYLENOL) 325 MG tablet Take 2 tablets (650 mg total) by mouth every 6 (six) hours as needed for  mild pain or headache.    Marland Kitchen aspirin EC 81 MG EC tablet Take 1 tablet (81 mg total) by mouth daily. 30 tablet 6  . carvedilol (COREG) 12.5 MG tablet Take 1 tablet (12.5 mg total) by mouth 2 (two) times daily with a meal. 60 tablet 6  . ezetimibe (ZETIA) 10 MG tablet Take 1 tablet (10 mg total) by mouth at bedtime. 30 tablet 6  . furosemide (LASIX) 40 MG tablet Take 2 tablets (80 mg total) by mouth 2 (two) times daily. 90 tablet 6  . glimepiride (AMARYL) 1 MG tablet TAKE 1MG  BY MOUTH DAILY WITH BREAKFAST 90 tablet 1  . hydrALAZINE (APRESOLINE) 50 MG tablet Take 1 tablet (50 mg total) by mouth 3 (three) times daily. 90 tablet 6  . isosorbide mononitrate (IMDUR) 60 MG 24 hr tablet Take 2 tablets (120 mg total) by mouth daily. 180 tablet 3  . Multiple Vitamin (MULTIVITAMIN WITH MINERALS) TABS tablet Take 1 tablet by mouth daily.    Marland Kitchen omeprazole (PRILOSEC) 20 MG capsule Take 20 mg by mouth daily.     . potassium chloride SA (K-DUR,KLOR-CON) 20 MEQ tablet Take 1 tablet (20 mEq total) by mouth daily. 30 tablet 5  . tamsulosin (FLOMAX) 0.4 MG CAPS capsule TAKE 0.4MG  BY MOUTH EVERY DAY AFTER DINNER 90 capsule 0  . Travoprost, BAK Free, (TRAVATAN) 0.004 % SOLN ophthalmic solution Place 1 drop into both eyes at bedtime.     . nitroGLYCERIN (NITROSTAT) 0.4 MG SL tablet Place 0.4 mg under the tongue every 5 (five) minutes as needed for chest pain. As needed     No current facility-administered medications for this encounter.      Allergies  Allergen Reactions  . Statins Other (See Comments)    Pain/weakness in legs  . Ambien [Zolpidem Tartrate] Anxiety and Other (See Comments)    Anxiety and hallucinations     Social History   Social History  . Marital status: Married    Spouse name: N/A  . Number of children: N/A  . Years of education: N/A   Occupational History  . retired owned [employer]    Social History Main Topics  . Smoking status: Never Smoker  . Smokeless tobacco: Never Used   . Alcohol use No  . Drug use: No  . Sexual activity: Not Currently   Other Topics Concern  . Not on file   Social History Narrative   Married Angelita Ingles   Never smoked   Alcohol none   Exercise walking, weights   Living Will, MOST form       Family History  Problem Relation Age of Onset  . Cancer Mother   . Heart attack Father   . Mental illness Brother   . Kidney  failure Brother     PHYSICAL EXAM: Vitals:   07/17/17 0934  BP: (!) 138/51  Pulse: 60  SpO2: 99%  Weight: 154 lb 12 oz (70.2 kg)   Wt Readings from Last 3 Encounters:  07/17/17 154 lb 12 oz (70.2 kg)  07/11/17 155 lb (70.3 kg)  07/10/17 155 lb 3.2 oz (70.4 kg)   General: NAD Neck: No JVD, no thyromegaly or thyroid nodule.  Lungs: Clear to auscultation bilaterally with normal respiratory effort. CV: Nondisplaced PMI.  Heart regular S1/S2, no S3/S4, 1/6 SEM RUSB.  No peripheral edema.  No carotid bruit.  Normal pedal pulses.  Abdomen: Soft, nontender, no hepatosplenomegaly, no distention.  Skin: Intact without lesions or rashes.  Neurologic: Alert and oriented x 3.  Psych: Normal affect. Extremities: No clubbing or cyanosis.  HEENT: Normal.    ASSESSMENT & PLAN: 1. CAD: S/p CABG.  No chest pain currently.  He presented to hospital twice in 7/18 with sudden onset dyspnea and chest tightness.  Troponin was very minimally elevated with no trend.  In absence of definite MI, would avoid cardiac cath with CKD stage 4.   - Continue ASA 81 and Zetia 10 mg dailiy.   - No statin with myalgias.  2. Aortic valve replacement: Bioprosthetic.  Valve looked stable on 7/18 echo.  3. Chronic systolic CHF: Ischemic cardiomyopathy.  EF 25-30% on 7/18 echo, down from EF 40-45%. He does not look volume overloaded on exam.  Weight is down overall.  - Continue lasix 80 mg bid. BMET today.  - Continue coreg 12.5 mg BID - Continue hydralazine 50 mg TID - Continue Imdur 120 mg daily.  - Not on ACEI because of CKD.   4. CKD  stage IV: Suspect this makes volume management more difficult and may have predisposed him to flash pulmonary edema (recent admissions).  - BMET today.    Followup in 2 wks with NP/PA.   Loralie Champagne, MD  07/17/2017

## 2017-07-20 ENCOUNTER — Telehealth (HOSPITAL_COMMUNITY): Payer: Self-pay | Admitting: *Deleted

## 2017-07-20 IMAGING — CR DG CHEST 1V PORT
1 series · 1 of 1 positions shown · non-contrast
Comparison: 09/20/2015 at [DATE] a.m.

CLINICAL DATA: Tachypnea.

EXAM:
PORTABLE CHEST 1 VIEW

[AP]
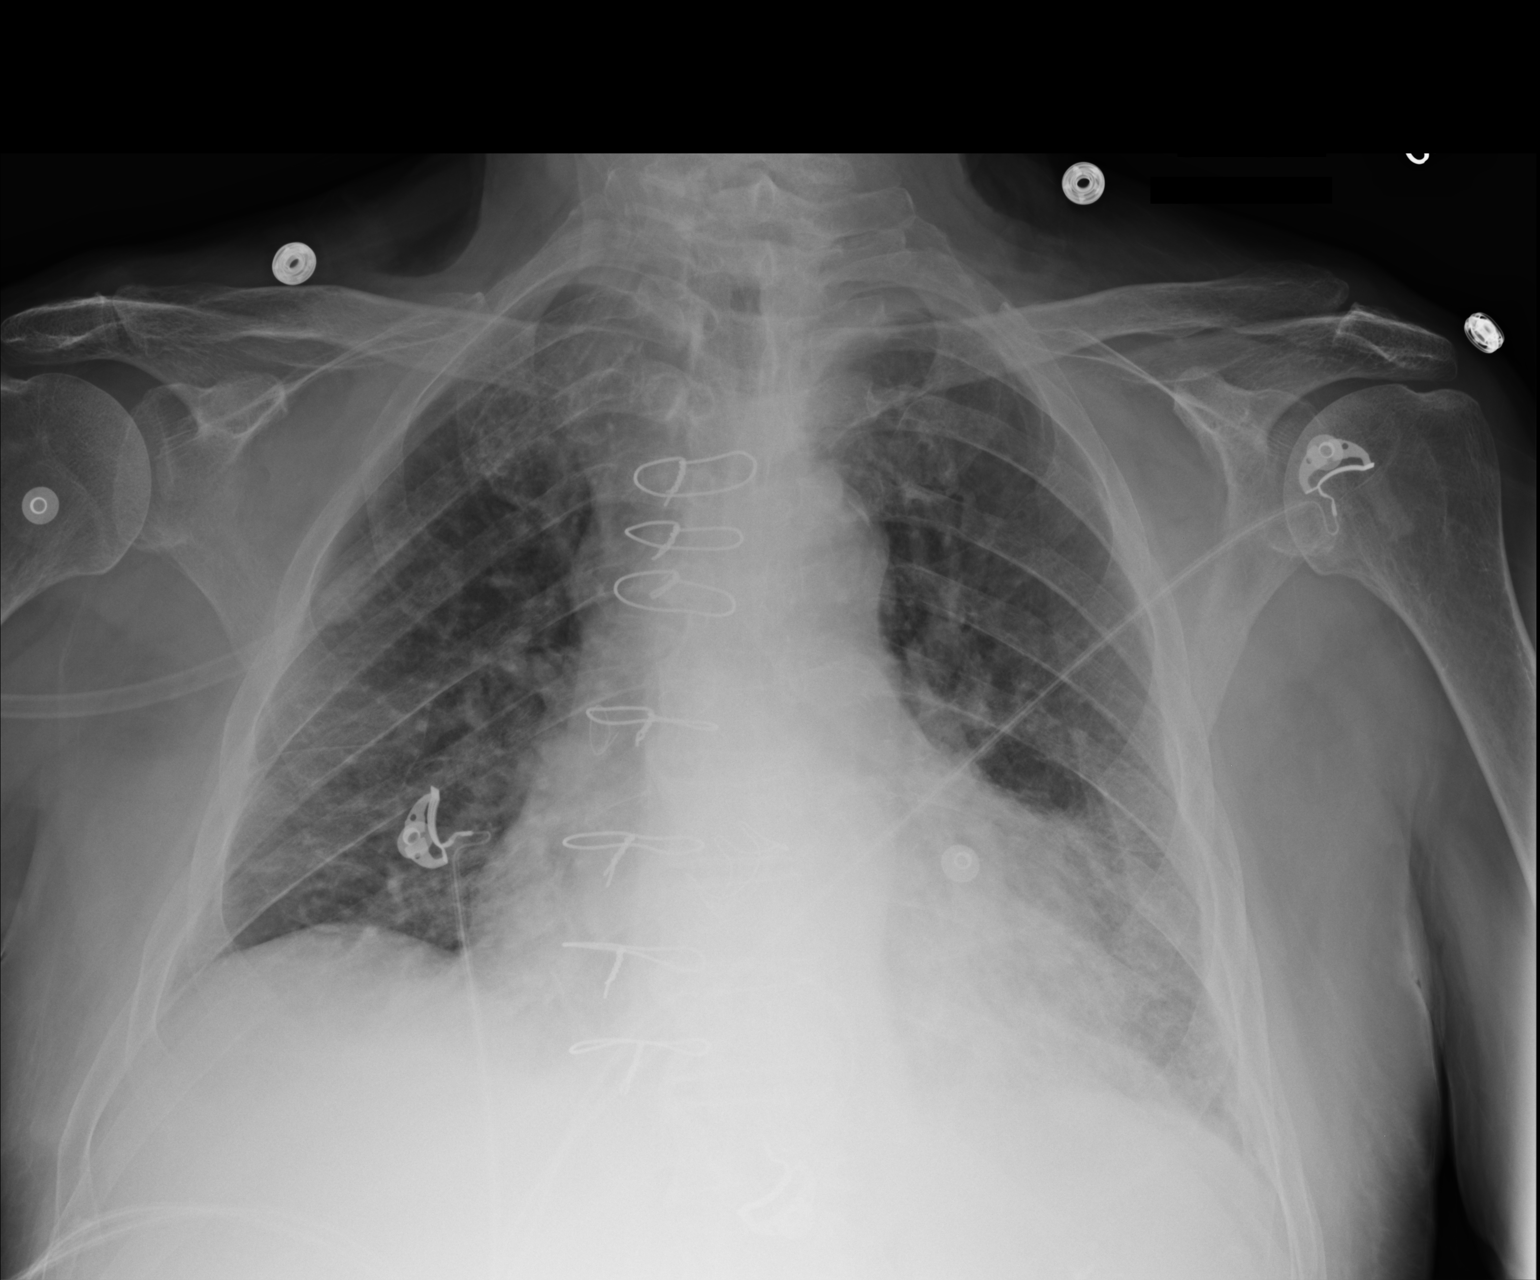

[1 of 1 positions shown; findings below may reference images not displayed]

FINDINGS: There changes from cardiac surgery, stable. Cardiac silhouette is
mildly enlarged. Left lung base streaky opacity noted on the prior
study is stable. Mild prominence of the bronchovascular markings.
Lungs otherwise clear. No convincing pleural effusion and no
pneumothorax. No mediastinal or hilar masses or convincing
adenopathy.
IMPRESSION: 1. No convincing change from the earlier study.
2. Opacity noted behind the left heart on the prior study is
unchanged. This is most likely atelectasis. Pneumonia not excluded.
No evidence of pulmonary edema.

## 2017-07-20 IMAGING — CR DG CHEST 2V
2 series · 2 of 2 positions shown · non-contrast
Comparison: 06/28/2014

CLINICAL DATA: Right-sided chest pain since [DATE] last night.
Shortness of breath. History of hypertension.

EXAM:
CHEST  2 VIEW

[chest lat]
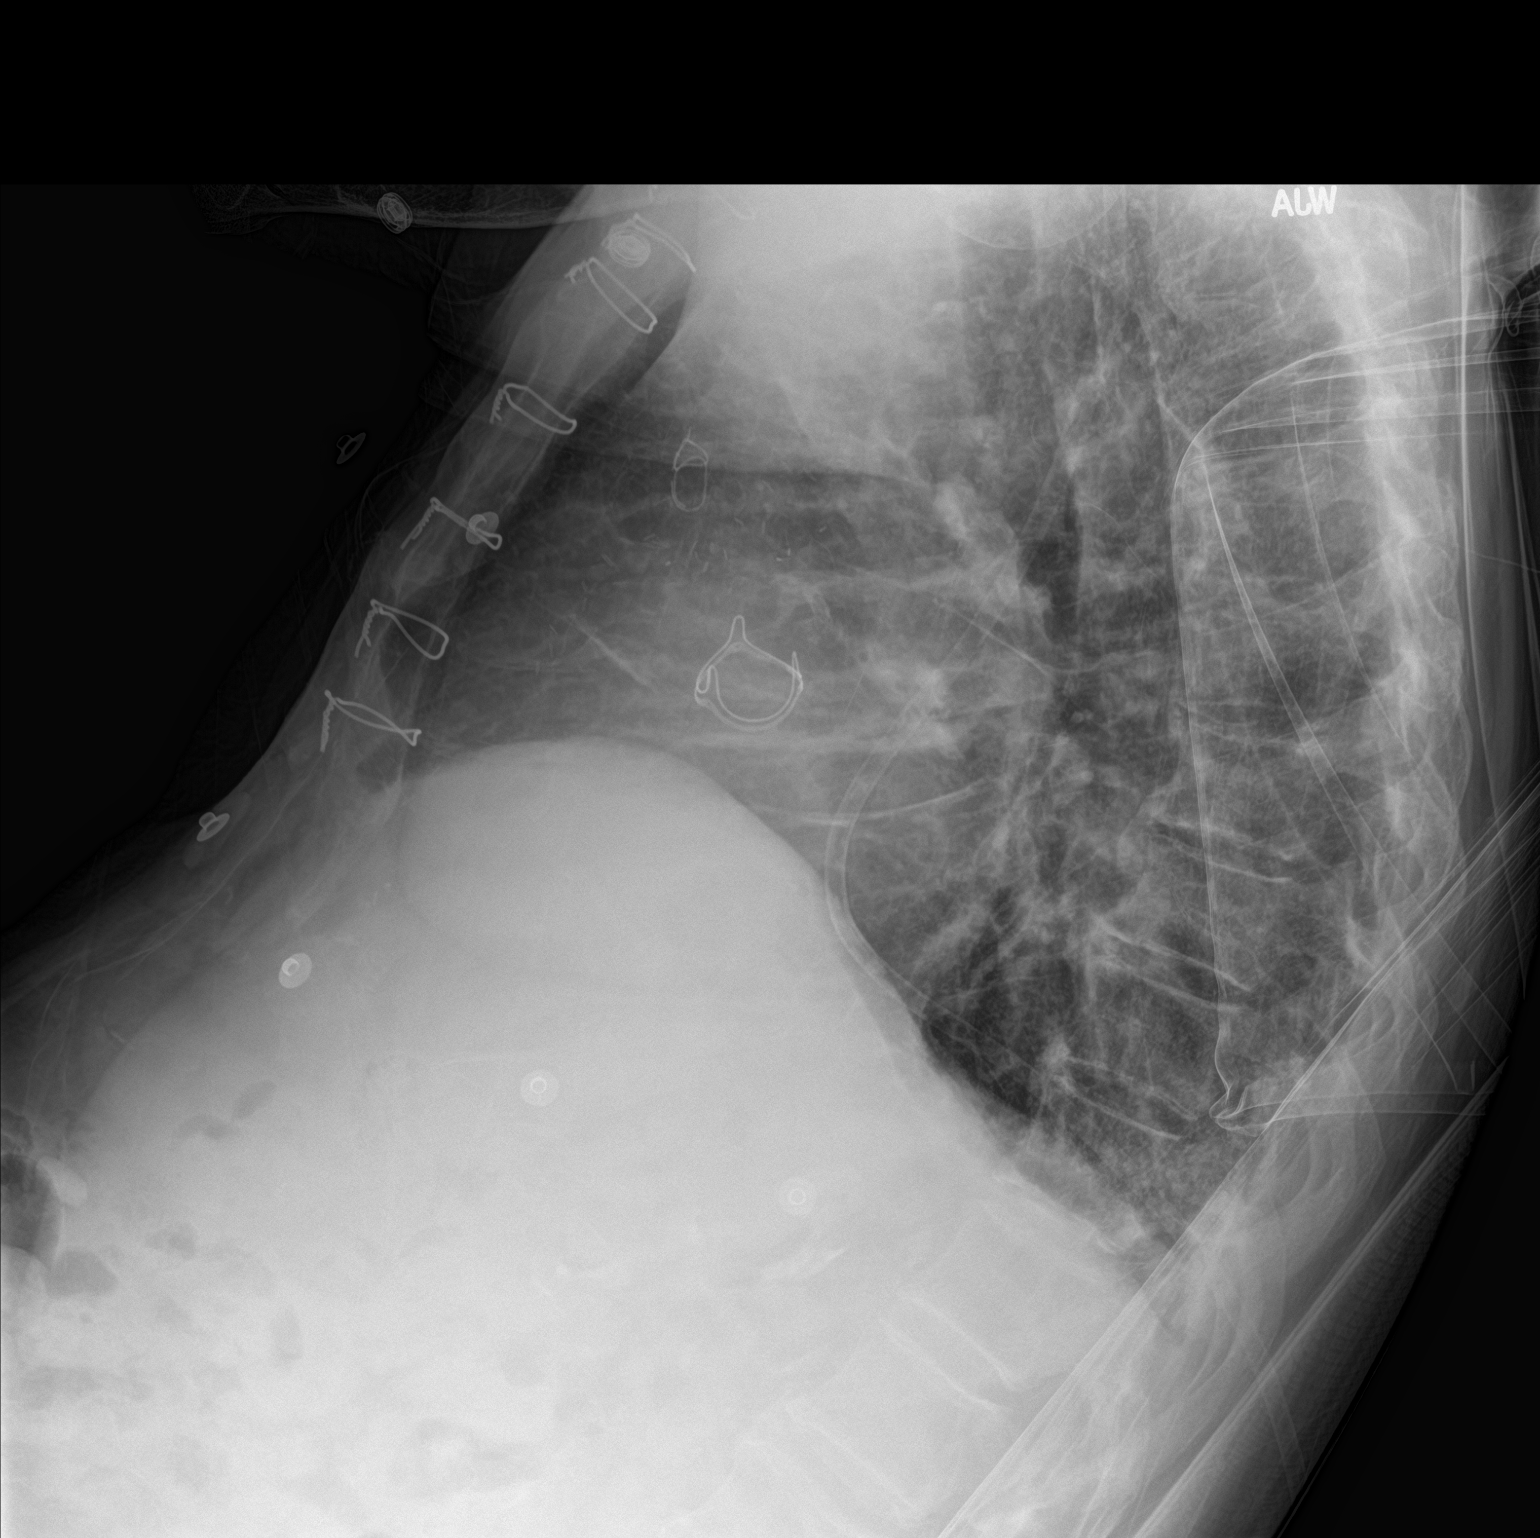

[chest ap]
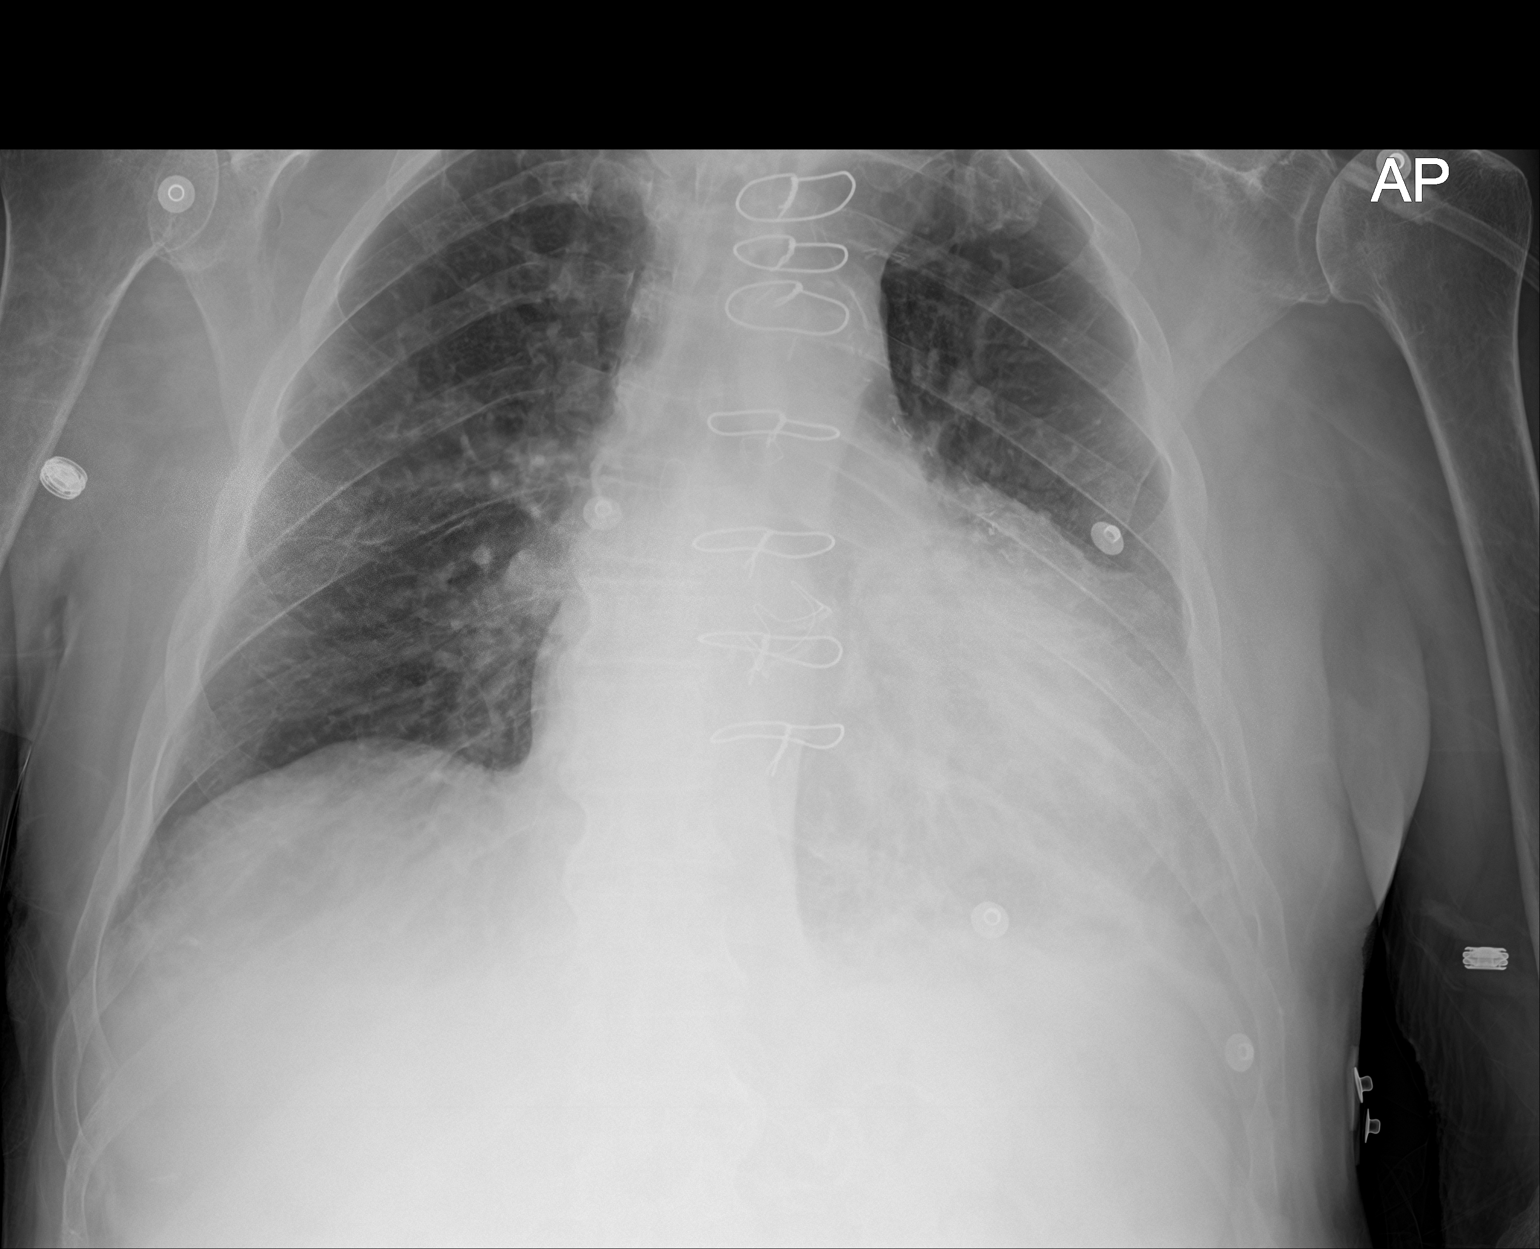

[2 of 2 positions shown; findings below may reference images not displayed]

FINDINGS: Postoperative changes in the mediastinum. Cardiac enlargement. No
pulmonary vascular congestion. There is infiltration or atelectasis
in the left lung base behind the heart. No blunting of costophrenic
angles. No pneumothorax. Mediastinal contours appear intact.
Calcification of the aorta. Degenerative changes in the spine.
IMPRESSION: Infiltration or atelectasis in the left lung base behind the heart.

## 2017-07-20 NOTE — Telephone Encounter (Signed)
Basic metabolic panel  Order: [id number]  Status:  Final result Visible to patient:  No (Not Released) Dx:  Chronic combined systolic and diastol...  Notes recorded by Darron Doom, RN on 07/20/2017 at 9:39 AM EDT Tried calling but no answer and no voicemail. I will mail results today. ------  Notes recorded by Larey Dresser, MD on 07/17/2017 at 5:24 PM EDT Creatinine better, let him know.

## 2017-07-24 ENCOUNTER — Inpatient Hospital Stay (HOSPITAL_COMMUNITY): Payer: Medicare Other

## 2017-07-30 ENCOUNTER — Other Ambulatory Visit: Payer: Self-pay | Admitting: *Deleted

## 2017-07-30 NOTE — Patient Outreach (Signed)
[place] [facility]) Care Management  07/30/2017  [name] [name] 04/20/1926 [id number]  EMMI-Heart Failure-Red Alert referral, Day #18 on 07/28/2017;  Reason-Filled new prescriptions-No  Call#1 outreach to patient; no answer & unable to leave voice message.  Plan: Will follow up.  Sherrin Daisy, RN BSN [place] Management Coordinator [facility] Care Management  [phone]

## 2017-07-31 ENCOUNTER — Other Ambulatory Visit: Payer: Self-pay | Admitting: *Deleted

## 2017-07-31 ENCOUNTER — Ambulatory Visit (HOSPITAL_COMMUNITY)
Admission: RE | Admit: 2017-07-31 | Discharge: 2017-07-31 | Disposition: A | Discharge status: Home / Self Care | Payer: Medicare Other | Source: Ambulatory Visit | Attending: Internal Medicine | Admitting: Internal Medicine

## 2017-07-31 VITALS — BP 140/70 | HR 70 | Wt 155.6 lb

## 2017-07-31 DIAGNOSIS — I350 Nonrheumatic aortic (valve) stenosis: Secondary | ICD-10-CM | POA: Insufficient documentation

## 2017-07-31 DIAGNOSIS — Z85038 Personal history of other malignant neoplasm of large intestine: Secondary | ICD-10-CM | POA: Diagnosis not present

## 2017-07-31 DIAGNOSIS — K219 Gastro-esophageal reflux disease without esophagitis: Secondary | ICD-10-CM | POA: Diagnosis not present

## 2017-07-31 DIAGNOSIS — Z9049 Acquired absence of other specified parts of digestive tract: Secondary | ICD-10-CM | POA: Insufficient documentation

## 2017-07-31 DIAGNOSIS — N183 Chronic kidney disease, stage 3 unspecified: Secondary | ICD-10-CM

## 2017-07-31 DIAGNOSIS — I255 Ischemic cardiomyopathy: Secondary | ICD-10-CM | POA: Insufficient documentation

## 2017-07-31 DIAGNOSIS — I10 Essential (primary) hypertension: Secondary | ICD-10-CM | POA: Diagnosis not present

## 2017-07-31 DIAGNOSIS — K4090 Unilateral inguinal hernia, without obstruction or gangrene, not specified as recurrent: Secondary | ICD-10-CM | POA: Insufficient documentation

## 2017-07-31 DIAGNOSIS — I5043 Acute on chronic combined systolic (congestive) and diastolic (congestive) heart failure: Secondary | ICD-10-CM

## 2017-07-31 DIAGNOSIS — I2510 Atherosclerotic heart disease of native coronary artery without angina pectoris: Secondary | ICD-10-CM | POA: Insufficient documentation

## 2017-07-31 DIAGNOSIS — I5042 Chronic combined systolic (congestive) and diastolic (congestive) heart failure: Secondary | ICD-10-CM | POA: Diagnosis not present

## 2017-07-31 DIAGNOSIS — I130 Hypertensive heart and chronic kidney disease with heart failure and stage 1 through stage 4 chronic kidney disease, or unspecified chronic kidney disease: Secondary | ICD-10-CM | POA: Diagnosis not present

## 2017-07-31 DIAGNOSIS — F419 Anxiety disorder, unspecified: Secondary | ICD-10-CM | POA: Insufficient documentation

## 2017-07-31 DIAGNOSIS — Z953 Presence of xenogenic heart valve: Secondary | ICD-10-CM | POA: Diagnosis not present

## 2017-07-31 DIAGNOSIS — Z952 Presence of prosthetic heart valve: Secondary | ICD-10-CM

## 2017-07-31 DIAGNOSIS — I252 Old myocardial infarction: Secondary | ICD-10-CM | POA: Diagnosis not present

## 2017-07-31 DIAGNOSIS — I5022 Chronic systolic (congestive) heart failure: Secondary | ICD-10-CM | POA: Insufficient documentation

## 2017-07-31 DIAGNOSIS — Z7984 Long term (current) use of oral hypoglycemic drugs: Secondary | ICD-10-CM | POA: Insufficient documentation

## 2017-07-31 DIAGNOSIS — E1122 Type 2 diabetes mellitus with diabetic chronic kidney disease: Secondary | ICD-10-CM | POA: Diagnosis not present

## 2017-07-31 DIAGNOSIS — Z8673 Personal history of transient ischemic attack (TIA), and cerebral infarction without residual deficits: Secondary | ICD-10-CM | POA: Insufficient documentation

## 2017-07-31 DIAGNOSIS — N184 Chronic kidney disease, stage 4 (severe): Secondary | ICD-10-CM | POA: Diagnosis not present

## 2017-07-31 DIAGNOSIS — Z951 Presence of aortocoronary bypass graft: Secondary | ICD-10-CM | POA: Insufficient documentation

## 2017-07-31 DIAGNOSIS — I340 Nonrheumatic mitral (valve) insufficiency: Secondary | ICD-10-CM | POA: Diagnosis not present

## 2017-07-31 DIAGNOSIS — Z7982 Long term (current) use of aspirin: Secondary | ICD-10-CM | POA: Diagnosis not present

## 2017-07-31 DIAGNOSIS — D649 Anemia, unspecified: Secondary | ICD-10-CM | POA: Insufficient documentation

## 2017-07-31 DIAGNOSIS — Z9889 Other specified postprocedural states: Secondary | ICD-10-CM | POA: Insufficient documentation

## 2017-07-31 LAB — BASIC METABOLIC PANEL
ANION GAP: 9 (ref 5–15)
BUN: 53 mg/dL — ABNORMAL HIGH (ref 6–20)
CALCIUM: 9.2 mg/dL (ref 8.9–10.3)
CHLORIDE: 104 mmol/L (ref 101–111)
CO2: 29 mmol/L (ref 22–32)
Creatinine, Ser: 2.68 mg/dL — ABNORMAL HIGH (ref 0.61–1.24)
GFR calc non Af Amer: 20 mL/min — ABNORMAL LOW (ref >60)
GFR, EST AFRICAN AMERICAN: 23 mL/min — AB (ref >60)
GLUCOSE: 147 mg/dL — AB (ref 65–99)
POTASSIUM: 4.2 mmol/L (ref 3.5–5.1)
Sodium: 142 mmol/L (ref 135–145)

## 2017-07-31 NOTE — Patient Outreach (Signed)
[place] [facility]) Care Management  07/31/2017  [name] [name] 04/22/1926 [id number]  EMMI-Heart Failure-Red Alert. Day#16, 07/28/2017: Reason: Filled new prescription-No.  Telephone call to patient: spouse answered call & advised that she would need to take call because spouse has hearing difficulty.   Advised that patient did not need to get prescription filled because he already had medication.  States he is taking medications as prescribed by MD.   Voices that he has appointment with Heart vascular clinic today & has had appointment with primary care provider. Voices that patient weighs daily and has not had any weigh gain or noted swelling.  States no concerns at this time. EMMI call completed.  Plan: Close case/send to care management assistant.  Sherrin Daisy, RN BSN [place] Management Coordinator [facility] Care Management  [phone]

## 2017-07-31 NOTE — Progress Notes (Signed)
Patient ID: [name] [name], male   DOB: [birth year], 83 y.o.   MRN: [id number]     Advanced Heart Failure Clinic Note   PCP: [name] [name] HF: [name] [name]   HPI: [name] [name] is a 83 y.o. male with a PMHx of CAD, CABG, bioprosthetic AVR, anemia, CKD stage 4, chronic systolic heart failure. He is not on an ACE or ARB due renal failure.   Admitted to [facility] 4/28 through 04/15/13  with volume overload. Diuresed with IV lasix. He was not discharged on diuretics. Discharge weight 139 pounds.   He had a hernia repair in 10/16 and developed volume overload after.  Lasix was increased but he had progressive dyspnea.  He was readmitted and found to have AKI with anemia and HCAP.  He was treated for PNA and transfused 2 units PRBCs.  He was discharged to short-term rehab and remains there today.   Echo in 10/16 showed EF 40-45%, inferior/inferolateral/inferoseptal akinesis, bioprosthetic AoV with mean gradient 12 mmHg, RV with moderately decreased systolic function, PASP 54 mmHg.   He was admitted in 11/16 with aspiration PNA after inguinal hernia surgery.   He was admitted twice in 7/18, both times with sudden onset dyspnea and chest pressure.  TnI was minimally elevated (0.03-0.05 range).  BNP was elevated.  He improved rapidly with IV diuresis.  V/Q scan was negative.  Echo in 7/18 showed EF 25-30%.  Lasix was increased from 40 mg bid to 60 mg bid then to 80 mg bid.    He presents today for regular follow up. Has been doing well overall. Weight at home stable between 146-148 lbs. Denies lightheadedness or dizziness. No SOB on flat ground. Walks the neighborhood weather permitting. Denies chest pain. Denies palpitations. No orthopnea, PND, or bendopnea.   Labs: 05/28/13 Creatinine 2.37 Potassium 4.3 7/14 Creatinine 2.2 3/15 HCT 35.9 10/16 K 3.5, creatinine 2.82, HCT 29.6 11/16 K 4.2, creatinine 2.2, HCT 33.3, plts 127 4/18 K 4.8, creatinine 2.38 8/18 K 4.6, creatinine 3.13  Past Medical History:    Diagnosis Date  . Anemia   . Anxiety   . Aortic stenosis    a. s/p AVR with 72mm Edwards pericardial valve 02/07/12 - post-op course complicated by pleural effusion requring thoracentesis/leg cellulitis 03/2012.  . Baker's cyst 07/23/12   Incidental finding of LE venous dopplers  . Blood transfusion    NO REACTION TO TRANSFUSION  . CAD (coronary artery disease)    a. s/p NSTEMI 12/2011:  LHC - Ostial left main 20%, ostial LAD 50%, mid 60-70%, ostial D1 40% and mid 40%, D2 70%, ostial circumflex occluded, ostial RCA 80-90%, LVEDP was 42. b.  s/p CABG x 3 at time of AVR (LiMA-LAD, SVG-2nd daigonal, SVG-PDA) 02/07/12 (post-op course noted above).  . Cancer [facility]) '90's   Colon  . Cataract    right eye, hx of  . Cellulitis    a. RLE cellulitis 2 months post-operatively after AVR/CABG - serratia marcessans, tx with I&D/antibiotics  . CHF (congestive heart failure) ([place])   . Chronic kidney disease   . Chronic systolic heart failure ([place])    a. TEE 01/2012: EF 25-30%, diffuse hypokinesis. b. Not on ACEI due to renal insufficiency.;  c. follow up  echo 08/06/12: EF 25%, mod diast dysfxn, AVR ok, mild [name], mod LAE, mild RAE, mild to mod RV systolic dysfunction  . Diabetes mellitus    borderline  . Flash pulmonary edema ([place])    Post-cath 12/2011, went into acute pulm edema requiring  IV lasix and intubation  . GERD (gastroesophageal reflux disease)   . History of colon cancer    s/p colon resection  . HLD (hyperlipidemia)   . HTN (hypertension)    primary, Dr. Hollace [name]  . Ischemic cardiomyopathy   . Mitral regurgitation    Moderate by TEE 01/2012  . Myocardial infarction ([place]) 12/2011  . Pleural effusion    a. post-operatively after AVR/CABG s/p thoracentesis 03/2012 yielding 1L serosanguinous fluid.  . Stroke ([place]) 10/01   RIght Leg weakness    Current Outpatient Prescriptions  Medication Sig Dispense Refill  . acetaminophen (TYLENOL) 325 MG tablet Take 2 tablets (650 mg total) by mouth  every 6 (six) hours as needed for mild pain or headache.    Marland Kitchen aspirin EC 81 MG EC tablet Take 1 tablet (81 mg total) by mouth daily. 30 tablet 6  . carvedilol (COREG) 12.5 MG tablet Take 1 tablet (12.5 mg total) by mouth 2 (two) times daily with a meal. 60 tablet 6  . ezetimibe (ZETIA) 10 MG tablet Take 1 tablet (10 mg total) by mouth at bedtime. 30 tablet 6  . furosemide (LASIX) 40 MG tablet Take 2 tablets (80 mg total) by mouth 2 (two) times daily. 90 tablet 6  . glimepiride (AMARYL) 1 MG tablet TAKE 1MG  BY MOUTH DAILY WITH BREAKFAST 90 tablet 1  . hydrALAZINE (APRESOLINE) 50 MG tablet Take 1 tablet (50 mg total) by mouth 3 (three) times daily. 90 tablet 6  . isosorbide mononitrate (IMDUR) 60 MG 24 hr tablet Take 2 tablets (120 mg total) by mouth daily. 180 tablet 3  . Multiple Vitamin (MULTIVITAMIN WITH MINERALS) TABS tablet Take 1 tablet by mouth daily.    Marland Kitchen omeprazole (PRILOSEC) 20 MG capsule Take 20 mg by mouth daily.     . potassium chloride SA (K-DUR,KLOR-CON) 20 MEQ tablet Take 1 tablet (20 mEq total) by mouth daily. 30 tablet 5  . tamsulosin (FLOMAX) 0.4 MG CAPS capsule TAKE 0.4MG  BY MOUTH EVERY DAY AFTER DINNER 90 capsule 0  . Travoprost, BAK Free, (TRAVATAN) 0.004 % SOLN ophthalmic solution Place 1 drop into both eyes at bedtime.     . nitroGLYCERIN (NITROSTAT) 0.4 MG SL tablet Place 0.4 mg under the tongue every 5 (five) minutes as needed for chest pain. As needed     No current facility-administered medications for this encounter.      Allergies  Allergen Reactions  . Statins Other (See Comments)    Pain/weakness in legs  . Ambien [Zolpidem Tartrate] Anxiety and Other (See Comments)    Anxiety and hallucinations     Social History   Social History  . Marital status: Married    Spouse name: N/A  . Number of children: N/A  . Years of education: N/A   Occupational History  . retired owned [employer]    Social History Main Topics  . Smoking status: Never Smoker   . Smokeless tobacco: Never Used  . Alcohol use No  . Drug use: No  . Sexual activity: Not Currently   Other Topics Concern  . Not on file   Social History Narrative   Married Angelita Ingles   Never smoked   Alcohol none   Exercise walking, weights   Living Will, MOST form       Family History  Problem Relation Age of Onset  . Cancer Mother   . Heart attack Father   . Mental illness Brother   . Kidney failure Brother  PHYSICAL EXAM: Vitals:   07/31/17 1541  BP: 140/70  Pulse: 70  SpO2: 98%  Weight: 155 lb 9.6 oz (70.6 kg)   Wt Readings from Last 3 Encounters:  07/31/17 155 lb 9.6 oz (70.6 kg)  07/17/17 154 lb 12 oz (70.2 kg)  07/11/17 155 lb (70.3 kg)   General: Well appearing. No resp difficulty. HEENT: Normal Neck: Supple. JVP 5-6. Carotids 2+ bilat; no bruits. No thyromegaly or nodule noted. Cor: PMI nondisplaced. RRR, 1/6 SEM RUSB. No peripheral edema. No carotid bruit. Normal pedal pulses. Lungs: CTAB, normal effort. Abdomen: Soft, non-tender, non-distended, no HSM. No bruits or masses. +BS  Extremities: No cyanosis, clubbing, rash, R and LLE no edema.  Neuro: Alert & orientedx3, cranial nerves grossly intact. moves all 4 extremities w/o difficulty. Affect pleasant   ASSESSMENT & PLAN: 1. CAD: S/p CABG.  No chest pain currently.  He presented to hospital twice in 7/18 with sudden onset dyspnea and chest tightness.   - No further chest pain.  - With recent admissions, troponin was very minimally elevated with no trend.  In absence of definite MI, would avoid cardiac cath with CKD stage 4.   - Continue ASA 81 and Zetia 10 mg dailiy.   - No statin with myalgias. No cahnge.  2. Aortic valve replacement: Bioprosthetic.   - Valve looked stable on 7/18 echo. No change.  3. Chronic systolic CHF: Ischemic cardiomyopathy.  EF 25-30% on 7/18 echo, down from EF 40-45%.  - Volume status stable on exam.  - NYHA class II-III symptoms.   - Continue lasix 80 mg bid. BMET  today.   - Continue coreg 12.5 mg BID - Continue hydralazine 50 mg TID - Continue Imdur 120 mg daily.  - Not on ACEI because of CKD.   - Reinforced fluid restriction to < 2 L daily, sodium restriction to less than 2000 mg daily, and the importance of daily weights.   4. CKD stage IV: Suspect this makes volume management more difficult and may have predisposed him to flash pulmonary edema (recent admissions).  - BMET today.  - Will not increase HTN meds to preserve renal perfusion.   Doing well overall. Do not see reason for additional 2 week follow up at this time. Labs today. RTC 6-8 weeks. Sooner with any symptoms.   Shirley Friar, PA-C  07/31/2017

## 2017-07-31 NOTE — Patient Instructions (Signed)
Labs today We will only contact you if something comes back abnormal or we need to make some changes. Otherwise no news is good news!   Your physician recommends that you schedule a follow-up appointment in: 6-8 weeks Dr Aundra Dubin   Do the following things EVERYDAY: 1) Weigh yourself in the morning before breakfast. Write it down and keep it in a log. 2) Take your medicines as prescribed 3) Eat low salt foods-Limit salt (sodium) to 2000 mg per day.  4) Stay as active as you can everyday 5) Limit all fluids for the day to less than 2 liters

## 2017-08-17 ENCOUNTER — Encounter (HOSPITAL_COMMUNITY): Payer: Medicare Other | Admitting: Cardiology

## 2017-09-11 DIAGNOSIS — Z23 Encounter for immunization: Secondary | ICD-10-CM | POA: Diagnosis not present

## 2017-09-14 DIAGNOSIS — Z85828 Personal history of other malignant neoplasm of skin: Secondary | ICD-10-CM | POA: Diagnosis not present

## 2017-09-14 DIAGNOSIS — C44622 Squamous cell carcinoma of skin of right upper limb, including shoulder: Secondary | ICD-10-CM | POA: Diagnosis not present

## 2017-09-14 DIAGNOSIS — L821 Other seborrheic keratosis: Secondary | ICD-10-CM | POA: Diagnosis not present

## 2017-09-14 DIAGNOSIS — D485 Neoplasm of uncertain behavior of skin: Secondary | ICD-10-CM | POA: Diagnosis not present

## 2017-09-14 DIAGNOSIS — C4359 Malignant melanoma of other part of trunk: Secondary | ICD-10-CM | POA: Diagnosis not present

## 2017-09-14 DIAGNOSIS — D692 Other nonthrombocytopenic purpura: Secondary | ICD-10-CM | POA: Diagnosis not present

## 2017-09-14 DIAGNOSIS — L570 Actinic keratosis: Secondary | ICD-10-CM | POA: Diagnosis not present

## 2017-09-20 ENCOUNTER — Encounter (HOSPITAL_COMMUNITY): Payer: Medicare Other | Admitting: Cardiology

## 2017-09-27 DIAGNOSIS — D0359 Melanoma in situ of other part of trunk: Secondary | ICD-10-CM | POA: Diagnosis not present

## 2017-09-27 DIAGNOSIS — Z85820 Personal history of malignant melanoma of skin: Secondary | ICD-10-CM | POA: Diagnosis not present

## 2017-09-27 DIAGNOSIS — Z85828 Personal history of other malignant neoplasm of skin: Secondary | ICD-10-CM | POA: Diagnosis not present

## 2017-09-27 DIAGNOSIS — C4359 Malignant melanoma of other part of trunk: Secondary | ICD-10-CM | POA: Diagnosis not present

## 2017-10-01 ENCOUNTER — Encounter: Payer: Self-pay | Admitting: Internal Medicine

## 2017-10-01 ENCOUNTER — Ambulatory Visit (INDEPENDENT_AMBULATORY_CARE_PROVIDER_SITE_OTHER): Payer: Medicare Other | Admitting: Internal Medicine

## 2017-10-01 VITALS — BP 158/70 | HR 61 | Temp 97.4°F | Wt 156.0 lb

## 2017-10-01 DIAGNOSIS — I255 Ischemic cardiomyopathy: Secondary | ICD-10-CM | POA: Diagnosis not present

## 2017-10-01 DIAGNOSIS — N184 Chronic kidney disease, stage 4 (severe): Secondary | ICD-10-CM

## 2017-10-01 DIAGNOSIS — E118 Type 2 diabetes mellitus with unspecified complications: Secondary | ICD-10-CM | POA: Diagnosis not present

## 2017-10-01 DIAGNOSIS — D631 Anemia in chronic kidney disease: Secondary | ICD-10-CM

## 2017-10-01 DIAGNOSIS — I480 Paroxysmal atrial fibrillation: Secondary | ICD-10-CM | POA: Diagnosis not present

## 2017-10-01 DIAGNOSIS — I5042 Chronic combined systolic (congestive) and diastolic (congestive) heart failure: Secondary | ICD-10-CM

## 2017-10-01 DIAGNOSIS — E7800 Pure hypercholesterolemia, unspecified: Secondary | ICD-10-CM | POA: Diagnosis not present

## 2017-10-01 DIAGNOSIS — I2589 Other forms of chronic ischemic heart disease: Secondary | ICD-10-CM | POA: Diagnosis not present

## 2017-10-01 DIAGNOSIS — Z7189 Other specified counseling: Secondary | ICD-10-CM | POA: Diagnosis not present

## 2017-10-01 NOTE — Progress Notes (Signed)
Location:  [facility] clinic Provider:  Boaz Berisha L. Mariea Clonts, D.O., C.M.D.  Code Status: DNR, discussed palliative care with patient today and involving them in his care with the advance heart failure clinic--he seems he's on optimal therapy at this point, is not a cath candidate and generally we are doing all we can do for his heart failure and CKD at this point, cognition is worsening, and he's had 3 recent hospitalizations.  He is miserable being hospitalized again and again.  Emphasized importance of communicating with CHF clinic with concerns at home before going to the ED.  He does have a MOST form that reads:  DNR, limited additional interventions, determine abx and fluids for short term, no feeding tubes which was completed in 2016 and pt and wife have at home.    Goals of Care:  Advanced Directives 07/11/2017  Does Patient Have a Medical Advance Directive? Yes  Type of Advance Directive Living will;Out of facility DNR (pink MOST or yellow form)  Does patient want to make changes to medical advance directive? -  Copy of [place] in Chart? No - copy requested  Would patient like information on creating a medical advance directive? -  Pre-existing out of facility DNR order (yellow form or pink MOST form) -     Chief Complaint  Patient presents with  . Medical Management of Chronic Issues    50mth follow-up, discuss hospital visits, want labs    HPI: Patient is a 83 y.o. male seen today for medical management of chronic diseases.    He's been hospitalized three times since his last appt with me in June.  He had acute on chronic systolic chf 7/37-10.  He'd presented with sob and chest pressure.  He was diuresed with 2 doses of IV lasix with rapid symptom improvement.  VQ was done after D dimer was positive, but negative.  Echo showed his EF had declined to 25-30% on 7/17 and grade 1 diastolic dysfunction.  For some reason, his vitamin D was stopped.  Clarise Cruz called him to set up a  TOC visit 7/20, but he wound up back in the ED on 7/26 with another episode of acute on chronic combined CHF, ischemic cardiomyopathy.  Lasix was initially increased and given IV.  Cr trended up to 3.10 from 2.65.  I/O was negative, but weight unchanged.  He was anxious and confused after ambien.  He had some sinus bradycardia, but it resolved w/o a decrease in his coreg.  He was for f/u bmp 8/2.  His test strips and lancets were stopped that time.  His f/u 7/27 with Dr. Aundra Dubin was canceled.  TOC visit was again arranged fro 7/31 with Janett Billow here.  She did get to see him.  No changes were made that day.  He then went to the ED again the very next day with atypical chest pain/spasm.  He was felt to possibly be having angina, but nonischemic changes on EKG and negative troponin.  He was set up to see cardiology outpatient.  He is not a cath candidate due to his CKD.  The next day, imdur was increased to 120mg  daily.  He then saw Dr. Aundra Dubin on 8/7--no changes then.   He saw PA at cardiology again 8/21 w/o changes.  Weight stable. Weight today was 156 lbs here.  Has trended up since 7/29 when it was 151.2 lbs (but had some difficulty with increased cr at that point).  BP elevated here, too. He is  feeling stable outside of being hungry b/c he was told to fast for labs and it's now noontime.  His shortness of breath is stable per his wife.  She has no active concerns.    Past Medical History:  Diagnosis Date  . Anemia   . Anxiety   . Aortic stenosis    a. s/p AVR with 72mm Edwards pericardial valve 02/07/12 - post-op course complicated by pleural effusion requring thoracentesis/leg cellulitis 03/2012.  . Baker's cyst 07/23/12   Incidental finding of LE venous dopplers  . Blood transfusion    NO REACTION TO TRANSFUSION  . CAD (coronary artery disease)    a. s/p NSTEMI 12/2011:  LHC - Ostial left main 20%, ostial LAD 50%, mid 60-70%, ostial D1 40% and mid 40%, D2 70%, ostial circumflex occluded, ostial RCA  80-90%, LVEDP was 42. b.  s/p CABG x 3 at time of AVR (LiMA-LAD, SVG-2nd daigonal, SVG-PDA) 02/07/12 (post-op course noted above).  . Cancer [facility]) '90's   Colon  . Cataract    right eye, hx of  . Cellulitis    a. RLE cellulitis 2 months post-operatively after AVR/CABG - serratia marcessans, tx with I&D/antibiotics  . CHF (congestive heart failure) ([place])   . Chronic kidney disease   . Chronic systolic heart failure ([place])    a. TEE 01/2012: EF 25-30%, diffuse hypokinesis. b. Not on ACEI due to renal insufficiency.;  c. follow up  echo 08/06/12: EF 25%, mod diast dysfxn, AVR ok, mild MR, mod LAE, mild RAE, mild to mod RV systolic dysfunction  . Diabetes mellitus    borderline  . Flash pulmonary edema ([place])    Post-cath 12/2011, went into acute pulm edema requiring IV lasix and intubation  . GERD (gastroesophageal reflux disease)   . History of colon cancer    s/p colon resection  . HLD (hyperlipidemia)   . HTN (hypertension)    primary, Dr. Hollace Kinnier  . Ischemic cardiomyopathy   . Mitral regurgitation    Moderate by TEE 01/2012  . Myocardial infarction ([place]) 12/2011  . Pleural effusion    a. post-operatively after AVR/CABG s/p thoracentesis 03/2012 yielding 1L serosanguinous fluid.  . Stroke ([place]) 10/01   RIght Leg weakness    Past Surgical History:  Procedure Laterality Date  . AORTIC VALVE REPLACEMENT  02/07/2012   Procedure: AORTIC VALVE REPLACEMENT (AVR);  Surgeon: Gaye Pollack, MD;  Location: [place];  Service: Open Heart Surgery;  Laterality: N/A;  . CARDIAC CATHETERIZATION     1.3.13  stopped breathing, put on ventilator for 5-6 days  . CATARACT EXTRACTION     rt  . CHEST TUBE INSERTION  07/24/2012   Procedure: INSERTION PLEURAL DRAINAGE CATHETER;  Surgeon: Gaye Pollack, MD;  Location: [place];  Service: Thoracic;  Laterality: Left;  . COLON RESECTION  1996  . COLONOSCOPY    . CORONARY ARTERY BYPASS GRAFT  02/07/2012   Procedure: CORONARY ARTERY BYPASS GRAFTING (CABG);   Surgeon: Gaye Pollack, MD;  Location: [place];  Service: Open Heart Surgery;  Laterality: N/A;  CABG x three; using right leg greater saphenous vein harvested endoscopically  . ERCP N/A 07/08/2014   Procedure: ENDOSCOPIC RETROGRADE CHOLANGIOPANCREATOGRAPHY (ERCP);  Surgeon: Missy Sabins, MD;  Location: [facility] ENDOSCOPY;  Service: Endoscopy;  Laterality: N/A;  . ESOPHAGEAL DILATION    . ESOPHAGOGASTRODUODENOSCOPY N/A 07/03/2014   Procedure: ESOPHAGOGASTRODUODENOSCOPY (EGD);  Surgeon: Arta Silence, MD;  Location: [facility] ENDOSCOPY;  Service: Endoscopy;  Laterality: N/A;  . ESOPHAGOSCOPY  WITH DILITATION N/A 07/07/2014   Procedure: ESOPHAGOSCOPY WITH DILITATION/SAVARY DILATOR;  Surgeon: Rozetta Nunnery, MD;  Location: [facility];  Service: ENT;  Laterality: N/A;  . EYE SURGERY  2009   cataract removed from right eye  . INGUINAL HERNIA REPAIR Right 09/13/2015   Procedure: OPEN RIGHT INGUINAL HERNIA;  Surgeon: Mickeal Skinner, MD;  Location: [place];  Service: General;  Laterality: Right;  . INSERTION OF MESH Right 09/13/2015   Procedure: INSERTION OF MESH;  Surgeon: Mickeal Skinner, MD;  Location: [place];  Service: General;  Laterality: Right;  . LEFT HEART CATHETERIZATION WITH CORONARY ANGIOGRAM N/A 12/13/2011   Procedure: LEFT HEART CATHETERIZATION WITH CORONARY ANGIOGRAM;  Surgeon: Peter M Martinique, MD;  Location: [facility] CATH LAB;  Service: Cardiovascular;  Laterality: N/A;  . REMOVAL OF PLEURAL DRAINAGE CATHETER  09/27/2012   Procedure: REMOVAL OF PLEURAL DRAINAGE CATHETER;  Surgeon: Gaye Pollack, MD;  Location: [place];  Service: Thoracic;  Laterality: Left;  . TALC PLEURODESIS  08/30/2012   Procedure: Pietro Cassis;  Surgeon: Gaye Pollack, MD;  Location: [place];  Service: Thoracic;  Laterality: Left;  INSERTION OF TALC VIA LEFT PLEURX  . TALC PLEURODESIS  09/12/2012   Procedure: Pietro Cassis;  Surgeon: Gaye Pollack, MD;  Location: [place];  Service: Thoracic;  Laterality: Left;  . TONSILLECTOMY       Allergies  Allergen Reactions  . Statins Other (See Comments)    Pain/weakness in legs  . Ambien [Zolpidem Tartrate] Anxiety and Other (See Comments)    Anxiety and hallucinations     Outpatient Encounter Prescriptions as of 10/01/2017  Medication Sig  . acetaminophen (TYLENOL) 325 MG tablet Take 2 tablets (650 mg total) by mouth every 6 (six) hours as needed for mild pain or headache.  Marland Kitchen aspirin EC 81 MG EC tablet Take 1 tablet (81 mg total) by mouth daily.  . carvedilol (COREG) 12.5 MG tablet Take 1 tablet (12.5 mg total) by mouth 2 (two) times daily with a meal.  . ezetimibe (ZETIA) 10 MG tablet Take 1 tablet (10 mg total) by mouth at bedtime.  . furosemide (LASIX) 40 MG tablet Take 2 tablets (80 mg total) by mouth 2 (two) times daily.  Marland Kitchen glimepiride (AMARYL) 1 MG tablet TAKE 1MG  BY MOUTH DAILY WITH BREAKFAST  . hydrALAZINE (APRESOLINE) 50 MG tablet Take 1 tablet (50 mg total) by mouth 3 (three) times daily.  . isosorbide mononitrate (IMDUR) 60 MG 24 hr tablet Take 2 tablets (120 mg total) by mouth daily.  . Multiple Vitamin (MULTIVITAMIN WITH MINERALS) TABS tablet Take 1 tablet by mouth daily.  . nitroGLYCERIN (NITROSTAT) 0.4 MG SL tablet Place 0.4 mg under the tongue every 5 (five) minutes as needed for chest pain. As needed  . omeprazole (PRILOSEC) 20 MG capsule Take 20 mg by mouth daily.   . potassium chloride SA (K-DUR,KLOR-CON) 20 MEQ tablet Take 1 tablet (20 mEq total) by mouth daily.  . tamsulosin (FLOMAX) 0.4 MG CAPS capsule TAKE 0.4MG  BY MOUTH EVERY DAY AFTER DINNER  . Travoprost, BAK Free, (TRAVATAN) 0.004 % SOLN ophthalmic solution Place 1 drop into both eyes at bedtime.    No facility-administered encounter medications on file as of 10/01/2017.     Review of Systems:  Review of Systems  Constitutional: Positive for malaise/fatigue. Negative for chills and fever.  HENT: Positive for hearing loss. Negative for congestion.   Eyes: Negative for blurred vision.        Glasses  Respiratory: Positive for shortness of breath. Negative for cough, sputum production and wheezing.   Cardiovascular: Negative for chest pain and palpitations.  Gastrointestinal: Negative for abdominal pain.  Genitourinary: Negative for dysuria.  Musculoskeletal: Negative for falls.  Skin: Negative for itching and rash.       Had melanoma removed from his left back this week  Neurological: Positive for weakness. Negative for dizziness and loss of consciousness.  Endo/Heme/Allergies: Bruises/bleeds easily.  Psychiatric/Behavioral: Positive for memory loss. Negative for depression. The patient is nervous/anxious. The patient does not have insomnia.     Health Maintenance  Topic Date Due  . OPHTHALMOLOGY EXAM  03/25/2016  . INFLUENZA VACCINE  07/11/2017  . HEMOGLOBIN A1C  09/12/2017  . FOOT EXAM  05/24/2018  . URINE MICROALBUMIN  05/24/2018  . TETANUS/TDAP  08/25/2025  . PNA vac Low Risk Adult  Completed    Physical Exam: Vitals:   10/01/17 1106  BP: (!) 158/70  Pulse: 61  Temp: (!) 97.4 F (36.3 C)  TempSrc: Oral  SpO2: 99%  Weight: 156 lb (70.8 kg)   Body mass index is 23.04 kg/m. Physical Exam  Constitutional: No distress.  Frail 84 yo male  HENT:  Head: Normocephalic and atraumatic.  Cardiovascular: Normal rate, regular rhythm and intact distal pulses.   Murmur heard. Pulmonary/Chest: Breath sounds normal. He has no rales.  Chronically appears dyspneic, but denies it  Abdominal: Soft. Bowel sounds are normal.  Musculoskeletal: Normal range of motion.  Neurological: He is alert.  Short term memory loss, talks less each visit  Skin: Skin is warm and dry.  Bandage on back after melanoma resection  Psychiatric: He has a normal mood and affect.    Labs reviewed: Basic Metabolic Panel:  Recent Labs  07/11/17 1734 07/17/17 1035 07/31/17 1605  NA 140 138 142  K 4.6 4.6 4.2  CL 102 99* 104  CO2 28 29 29   GLUCOSE 125* 224* 147*  BUN 64* 56* 53*    CREATININE 3.13* 2.83* 2.68*  CALCIUM 9.3 9.5 9.2   Liver Function Tests:  Recent Labs  03/13/17 1000 06/25/17 1114 06/26/17 0056  AST 17 25 22   ALT 14 20 19   ALKPHOS 50 42 36*  BILITOT 0.6 0.6 0.7  PROT 7.2 6.5 6.1*  ALBUMIN 3.9 3.2* 3.2*    Recent Labs  06/25/17 1114  LIPASE 25   No results for input(s): AMMONIA in the last 8760 hours. CBC:  Recent Labs  05/10/17 1300 06/25/17 1114 07/05/17 1517 07/11/17 1734  WBC 6.3 4.9 8.1 6.1  NEUTROABS 3.5  --   --  2.9  HGB 11.4* 11.1* 11.4* 11.9*  HCT 35.1* 33.9* 34.5* 36.1*  MCV 99.7* 100.0 97.5 98.1  PLT 124* 104* 142* 93*   Lipid Panel:  Recent Labs  03/13/17 1000  CHOL 166  HDL 33*  LDLCALC 96  TRIG 183*  CHOLHDL 5.0*   Lab Results  Component Value Date   HGBA1C 5.6 03/13/2017   Assessment/Plan 1. Chronic combined systolic and diastolic heart failure, NYHA class 2 (HCC) -seems he's stabilized again after increased lasix and then increased imdur therapy -recommend palliative care involvement given his advanced and recently worsened systolic chf and comfort-focused goals and advanced age -discussed palliative care involvement with pt and his wife today--will copy CHF clinic about this to see if they have a dedicated palliative care NP who could get involved to avoid hospitalizations for him  2. Controlled type 2 diabetes mellitus with complication, without long-term current  use of insulin (HCC) -well controlled, check hba1c since he fasted - Hemoglobin A1c  3. Chronic kidney disease (CKD), stage IV (severe) (HCC) -interferes to some degree with CHF mgt and vice versa -frail gentleman and high risk for dehydration  4. Ischemic cardiomyopathy -progressive EF drop last echo, cont current mgt   5. Anemia due to stage 4 chronic kidney disease ([place]) -last hgb pretty good at 11.9 and normocytic so would not start iron though recently noted that advanced heart failure pts benefit if hgb low  6. Pure  hypercholesterolemia -cont zetia only due to his CAD s/p CABG and isch cardiomyopathy, not having difficulty taking meds at this point   7.  ACP:  Discussed palliative care with pt and his wife today for about 20 mins -remainder of visit spent on medical mgt --will copy Dr. Aundra Dubin  Labs/tests ordered:   Orders Placed This Encounter  Procedures  . Hemoglobin A1c   Next appt:  01/31/2018 med mgt  Lincoln Ginley L. Korrie Hofbauer, D.O. [facility] [address], [place] [address] Cell Phone (Mon-Fri 8am-5pm):  [phone] On Call:  [phone] & follow prompts after 5pm & weekends Office Phone:  [phone] Office Fax:  [phone]

## 2017-10-02 LAB — HEMOGLOBIN A1C
Hgb A1c MFr Bld: 5.9 % of total Hgb — ABNORMAL HIGH (ref <5.7)
Mean Plasma Glucose: 123 (calc)
eAG (mmol/L): 6.8 (calc)

## 2017-10-03 DIAGNOSIS — I480 Paroxysmal atrial fibrillation: Secondary | ICD-10-CM | POA: Insufficient documentation

## 2017-10-03 NOTE — Addendum Note (Signed)
Addended by: Gayland Curry on: 10/03/2017 08:51 AM   Modules accepted: Orders

## 2017-10-08 ENCOUNTER — Encounter (HOSPITAL_COMMUNITY): Payer: Self-pay

## 2017-10-08 ENCOUNTER — Emergency Department (HOSPITAL_COMMUNITY)
Admission: EM | Admit: 2017-10-08 | Discharge: 2017-10-08 | Disposition: A | Discharge status: Home / Self Care | Payer: Medicare Other | Attending: Emergency Medicine | Admitting: Emergency Medicine

## 2017-10-08 ENCOUNTER — Emergency Department (HOSPITAL_COMMUNITY): Payer: Medicare Other

## 2017-10-08 DIAGNOSIS — I252 Old myocardial infarction: Secondary | ICD-10-CM | POA: Insufficient documentation

## 2017-10-08 DIAGNOSIS — R0602 Shortness of breath: Secondary | ICD-10-CM | POA: Diagnosis not present

## 2017-10-08 DIAGNOSIS — I200 Unstable angina: Secondary | ICD-10-CM | POA: Diagnosis not present

## 2017-10-08 DIAGNOSIS — Z8673 Personal history of transient ischemic attack (TIA), and cerebral infarction without residual deficits: Secondary | ICD-10-CM | POA: Diagnosis not present

## 2017-10-08 DIAGNOSIS — R072 Precordial pain: Secondary | ICD-10-CM | POA: Diagnosis not present

## 2017-10-08 DIAGNOSIS — I5023 Acute on chronic systolic (congestive) heart failure: Secondary | ICD-10-CM | POA: Diagnosis not present

## 2017-10-08 DIAGNOSIS — Z951 Presence of aortocoronary bypass graft: Secondary | ICD-10-CM | POA: Insufficient documentation

## 2017-10-08 DIAGNOSIS — R079 Chest pain, unspecified: Secondary | ICD-10-CM | POA: Diagnosis not present

## 2017-10-08 DIAGNOSIS — Z79899 Other long term (current) drug therapy: Secondary | ICD-10-CM | POA: Diagnosis not present

## 2017-10-08 DIAGNOSIS — I5042 Chronic combined systolic (congestive) and diastolic (congestive) heart failure: Secondary | ICD-10-CM | POA: Insufficient documentation

## 2017-10-08 DIAGNOSIS — I130 Hypertensive heart and chronic kidney disease with heart failure and stage 1 through stage 4 chronic kidney disease, or unspecified chronic kidney disease: Secondary | ICD-10-CM | POA: Diagnosis not present

## 2017-10-08 DIAGNOSIS — E1122 Type 2 diabetes mellitus with diabetic chronic kidney disease: Secondary | ICD-10-CM | POA: Diagnosis not present

## 2017-10-08 DIAGNOSIS — Z85038 Personal history of other malignant neoplasm of large intestine: Secondary | ICD-10-CM | POA: Insufficient documentation

## 2017-10-08 DIAGNOSIS — N184 Chronic kidney disease, stage 4 (severe): Secondary | ICD-10-CM | POA: Insufficient documentation

## 2017-10-08 DIAGNOSIS — R0789 Other chest pain: Secondary | ICD-10-CM | POA: Diagnosis present

## 2017-10-08 DIAGNOSIS — Z7982 Long term (current) use of aspirin: Secondary | ICD-10-CM | POA: Diagnosis not present

## 2017-10-08 DIAGNOSIS — I129 Hypertensive chronic kidney disease with stage 1 through stage 4 chronic kidney disease, or unspecified chronic kidney disease: Secondary | ICD-10-CM | POA: Diagnosis not present

## 2017-10-08 LAB — BASIC METABOLIC PANEL
ANION GAP: 10 (ref 5–15)
BUN: 59 mg/dL — AB (ref 6–20)
CALCIUM: 9.1 mg/dL (ref 8.9–10.3)
CO2: 29 mmol/L (ref 22–32)
Chloride: 99 mmol/L — ABNORMAL LOW (ref 101–111)
Creatinine, Ser: 2.62 mg/dL — ABNORMAL HIGH (ref 0.61–1.24)
GFR calc Af Amer: 23 mL/min — ABNORMAL LOW (ref >60)
GFR, EST NON AFRICAN AMERICAN: 20 mL/min — AB (ref >60)
GLUCOSE: 123 mg/dL — AB (ref 65–99)
Potassium: 4.4 mmol/L (ref 3.5–5.1)
Sodium: 138 mmol/L (ref 135–145)

## 2017-10-08 LAB — CBC
HCT: 35.6 % — ABNORMAL LOW (ref 39.0–52.0)
HEMOGLOBIN: 11.6 g/dL — AB (ref 13.0–17.0)
MCH: 32 pg (ref 26.0–34.0)
MCHC: 32.6 g/dL (ref 30.0–36.0)
MCV: 98.1 fL (ref 78.0–100.0)
Platelets: 140 10*3/uL — ABNORMAL LOW (ref 150–400)
RBC: 3.63 MIL/uL — ABNORMAL LOW (ref 4.22–5.81)
RDW: 13.5 % (ref 11.5–15.5)
WBC: 6.2 10*3/uL (ref 4.0–10.5)

## 2017-10-08 LAB — HEPATIC FUNCTION PANEL
ALBUMIN: 3.4 g/dL — AB (ref 3.5–5.0)
ALT: 18 U/L (ref 17–63)
AST: 24 U/L (ref 15–41)
Alkaline Phosphatase: 49 U/L (ref 38–126)
BILIRUBIN TOTAL: 0.8 mg/dL (ref 0.3–1.2)
Bilirubin, Direct: 0.2 mg/dL (ref 0.1–0.5)
Indirect Bilirubin: 0.6 mg/dL (ref 0.3–0.9)
Total Protein: 6.7 g/dL (ref 6.5–8.1)

## 2017-10-08 LAB — LIPASE, BLOOD: Lipase: 26 U/L (ref 11–51)

## 2017-10-08 LAB — I-STAT TROPONIN, ED
TROPONIN I, POC: 0.01 ng/mL (ref 0.00–0.08)
TROPONIN I, POC: 0.02 ng/mL (ref 0.00–0.08)
Troponin i, poc: 0.03 ng/mL (ref 0.00–0.08)

## 2017-10-08 MED ORDER — ONDANSETRON HCL 4 MG/2ML IJ SOLN
4.0000 mg | Freq: Once | INTRAMUSCULAR | Status: AC
  Administered 2017-10-08: 4 mg via INTRAVENOUS
  Filled 2017-10-08: qty 2

## 2017-10-08 MED ORDER — NITROGLYCERIN 0.4 MG SL SUBL: 0.4000 mg | SUBLINGUAL_TABLET | SUBLINGUAL | Status: DC | PRN

## 2017-10-08 MED ORDER — FUROSEMIDE 10 MG/ML IJ SOLN
80.0000 mg | Freq: Once | INTRAMUSCULAR | Status: AC
  Administered 2017-10-08: 80 mg via INTRAVENOUS
  Filled 2017-10-08: qty 8

## 2017-10-08 MED ORDER — MORPHINE SULFATE (PF) 4 MG/ML IV SOLN
4.0000 mg | Freq: Once | INTRAVENOUS | Status: AC
  Administered 2017-10-08: 4 mg via INTRAVENOUS
  Filled 2017-10-08: qty 1

## 2017-10-08 NOTE — ED Provider Notes (Cosign Needed)
[facility] EMERGENCY DEPARTMENT Provider Note   CSN: [id number] Arrival date & time: 10/08/17  1231     History   Chief Complaint Chief Complaint  Patient presents with  . Chest Pain    HPI [name] [name] is a 83 y.o. male.  HPI [name] [name] is a 83 y.o. male presents to ED with complaint of chest pain. Pain started at 10:30 am. Pain in left chest, radiating into left lower abdomen. Pain is sharp. It is not pleuritic. Denies cough, congestion, URI symptoms. Denies peripheral swelling. No N/V/D.  Patient has a history of severe CHF, aortic stenosis, coronary disease.  At the last visit with cardiology, discussed palliative noninvasive care for his chest pain and cardiac disease.  Patient in fact has an appointment with cardiology tomorrow.  Brought here due to concern about this acute chest pain today.  Past Medical History:  Diagnosis Date  . Anemia   . Anxiety   . Aortic stenosis    a. s/p AVR with 21mm Edwards pericardial valve 02/07/12 - post-op course complicated by pleural effusion requring thoracentesis/leg cellulitis 03/2012.  . Baker's cyst 07/23/12   Incidental finding of LE venous dopplers  . Blood transfusion    NO REACTION TO TRANSFUSION  . CAD (coronary artery disease)    a. s/p NSTEMI 12/2011:  LHC - Ostial left main 20%, ostial LAD 50%, mid 60-70%, ostial D1 40% and mid 40%, D2 70%, ostial circumflex occluded, ostial RCA 80-90%, LVEDP was 42. b.  s/p CABG x 3 at time of AVR (LiMA-LAD, SVG-2nd daigonal, SVG-PDA) 02/07/12 (post-op course noted above).  . Cancer [facility]) '90's   Colon  . Cataract    right eye, hx of  . Cellulitis    a. RLE cellulitis 2 months post-operatively after AVR/CABG - serratia marcessans, tx with I&D/antibiotics  . CHF (congestive heart failure) ([place])   . Chronic kidney disease   . Chronic systolic heart failure ([place])    a. TEE 01/2012: EF 25-30%, diffuse hypokinesis. b. Not on ACEI due to renal insufficiency.;  c. follow  up  echo 08/06/12: EF 25%, mod diast dysfxn, AVR ok, mild MR, mod LAE, mild RAE, mild to mod RV systolic dysfunction  . Diabetes mellitus    borderline  . Flash pulmonary edema ([place])    Post-cath 12/2011, went into acute pulm edema requiring IV lasix and intubation  . GERD (gastroesophageal reflux disease)   . History of colon cancer    s/p colon resection  . HLD (hyperlipidemia)   . HTN (hypertension)    primary, Dr. Hollace Kinnier  . Ischemic cardiomyopathy   . Mitral regurgitation    Moderate by TEE 01/2012  . Myocardial infarction ([place]) 12/2011  . Pleural effusion    a. post-operatively after AVR/CABG s/p thoracentesis 03/2012 yielding 1L serosanguinous fluid.  . Stroke ([place]) 10/01   RIght Leg weakness    Patient Active Problem List   Diagnosis Date Noted  . PAF (paroxysmal atrial fibrillation) ([place]) 10/03/2017  . Chest pain 09/20/2015  . Advance care planning 08/23/2015  . Diabetes mellitus type 2, controlled, with complications ([place]) 44/81/8563  . S/P AVR (aortic valve replacement) 08/18/2013  . S/P CABG x 3: LIMA-LAD, SVG-D2, SVG-rPDA 09/17/2012  . Atherosclerosis of native coronary artery-- s/p CABG 01/11/2012  . Chronic combined systolic and diastolic heart failure, NYHA class 2 ([place]) 01/11/2012  . Ischemic cardiomyopathy 01/11/2012  . Chronic kidney disease (CKD), stage IV (severe) ([place]) 01/11/2012  . HLD (hyperlipidemia) 01/11/2012  .  Acute on chronic renal failure ([place]) 12/13/2011  . Anemia due to chronic kidney disease 12/13/2011  . Hypertensive heart disease without CHF 02/03/2009    Past Surgical History:  Procedure Laterality Date  . AORTIC VALVE REPLACEMENT  02/07/2012   Procedure: AORTIC VALVE REPLACEMENT (AVR);  Surgeon: Gaye Pollack, MD;  Location: [facility];  Service: Open Heart Surgery;  Laterality: N/A;  . CARDIAC CATHETERIZATION     1.3.13  stopped breathing, put on ventilator for 5-6 days  . CATARACT EXTRACTION     rt  . CHEST TUBE INSERTION  07/24/2012    Procedure: INSERTION PLEURAL DRAINAGE CATHETER;  Surgeon: Gaye Pollack, MD;  Location: [facility];  Service: Thoracic;  Laterality: Left;  . COLON RESECTION  1996  . COLONOSCOPY    . CORONARY ARTERY BYPASS GRAFT  02/07/2012   Procedure: CORONARY ARTERY BYPASS GRAFTING (CABG);  Surgeon: Gaye Pollack, MD;  Location: [facility];  Service: Open Heart Surgery;  Laterality: N/A;  CABG x three; using right leg greater saphenous vein harvested endoscopically  . ERCP N/A 07/08/2014   Procedure: ENDOSCOPIC RETROGRADE CHOLANGIOPANCREATOGRAPHY (ERCP);  Surgeon: Missy Sabins, MD;  Location: [facility] ENDOSCOPY;  Service: Endoscopy;  Laterality: N/A;  . ESOPHAGEAL DILATION    . ESOPHAGOGASTRODUODENOSCOPY N/A 07/03/2014   Procedure: ESOPHAGOGASTRODUODENOSCOPY (EGD);  Surgeon: Arta Silence, MD;  Location: [facility] ENDOSCOPY;  Service: Endoscopy;  Laterality: N/A;  . ESOPHAGOSCOPY WITH DILITATION N/A 07/07/2014   Procedure: ESOPHAGOSCOPY WITH DILITATION/SAVARY DILATOR;  Surgeon: Rozetta Nunnery, MD;  Location[facility];  Service: ENT;  Laterality: N/A;  . EYE SURGERY  2009   cataract removed from right eye  . INGUINAL HERNIA REPAIR Right 09/13/2015   Procedure: OPEN RIGHT INGUINAL HERNIA;  Surgeon: Mickeal Skinner, MD;  Location: [facility];  Service: General;  Laterality: Right;  . INSERTION OF MESH Right 09/13/2015   Procedure: INSERTION OF MESH;  Surgeon: Mickeal Skinner, MD;  Location: [facility];  Service: General;  Laterality: Right;  . LEFT HEART CATHETERIZATION WITH CORONARY ANGIOGRAM N/A 12/13/2011   Procedure: LEFT HEART CATHETERIZATION WITH CORONARY ANGIOGRAM;  Surgeon: Peter M Martinique, MD;  Location: [facility] CATH LAB;  Service: Cardiovascular;  Laterality: N/A;  . REMOVAL OF PLEURAL DRAINAGE CATHETER  09/27/2012   Procedure: REMOVAL OF PLEURAL DRAINAGE CATHETER;  Surgeon: Gaye Pollack, MD;  Location: [facility];  Service: Thoracic;  Laterality: Left;  . TALC PLEURODESIS  08/30/2012   Procedure: Pietro Cassis;  Surgeon: Gaye Pollack, MD;  Location: [facility];  Service: Thoracic;  Laterality: Left;  INSERTION OF TALC VIA LEFT PLEURX  . TALC PLEURODESIS  09/12/2012   Procedure: Pietro Cassis;  Surgeon: Gaye Pollack, MD;  Location: [facility];  Service: Thoracic;  Laterality: Left;  . TONSILLECTOMY         Home Medications    Prior to Admission medications   Medication Sig Start Date End Date Taking? Authorizing Provider  acetaminophen (TYLENOL) 325 MG tablet Take 2 tablets (650 mg total) by mouth every 6 (six) hours as needed for mild pain or headache. 07/08/17   Erlene Quan, PA-C  aspirin EC 81 MG EC tablet Take 1 tablet (81 mg total) by mouth daily. 06/27/17   Shirley Friar, PA-C  carvedilol (COREG) 12.5 MG tablet Take 1 tablet (12.5 mg total) by mouth 2 (two) times daily with a meal. 06/26/17   Tillery, Satira Mccallum, PA-C  ezetimibe (ZETIA) 10 MG tablet Take 1 tablet (10 mg total) by  mouth at bedtime. 06/26/17   Shirley Friar, PA-C  furosemide (LASIX) 40 MG tablet Take 2 tablets (80 mg total) by mouth 2 (two) times daily. 07/08/17   Erlene Quan, PA-C  glimepiride (AMARYL) 1 MG tablet TAKE 1MG  BY MOUTH DAILY WITH BREAKFAST 07/08/17   Kerin Ransom K, PA-C  hydrALAZINE (APRESOLINE) 50 MG tablet Take 1 tablet (50 mg total) by mouth 3 (three) times daily. 06/26/17   Shirley Friar, PA-C  isosorbide mononitrate (IMDUR) 60 MG 24 hr tablet Take 2 tablets (120 mg total) by mouth daily. 07/12/17   Larey Dresser, MD  Multiple Vitamin (MULTIVITAMIN WITH MINERALS) TABS tablet Take 1 tablet by mouth daily.    [provider]  nitroGLYCERIN (NITROSTAT) 0.4 MG SL tablet Place 0.4 mg under the tongue every 5 (five) minutes as needed for chest pain. As needed    [provider]  omeprazole (PRILOSEC) 20 MG capsule Take 20 mg by mouth daily.     [provider]  potassium chloride SA (K-DUR,KLOR-CON) 20 MEQ tablet Take 1 tablet (20 mEq total) by mouth daily. 07/09/17   Erlene Quan, PA-C  tamsulosin (FLOMAX) 0.4 MG CAPS capsule TAKE 0.4MG  BY MOUTH EVERY DAY AFTER DINNER 07/08/17   Kilroy, Doreene Burke, PA-C  Travoprost, BAK Free, (TRAVATAN) 0.004 % SOLN ophthalmic solution Place 1 drop into both eyes at bedtime.     [provider]    Family History Family History  Problem Relation Age of Onset  . Cancer Mother   . Heart attack Father   . Mental illness Brother   . Kidney failure Brother     Social History Social History  Substance Use Topics  . Smoking status: Never Smoker  . Smokeless tobacco: Never Used  . Alcohol use No     Allergies   Statins and Ambien [zolpidem tartrate]   Review of Systems Review of Systems  Constitutional: Negative for chills and fever.  Respiratory: Negative for cough, chest tightness and shortness of breath.   Cardiovascular: Positive for chest pain. Negative for palpitations and leg swelling.  Gastrointestinal: Positive for abdominal pain. Negative for abdominal distention, diarrhea, nausea and vomiting.  Genitourinary: Negative for dysuria, frequency, hematuria and urgency.  Musculoskeletal: Negative for arthralgias, myalgias, neck pain and neck stiffness.  Skin: Negative for rash.  Allergic/Immunologic: Negative for immunocompromised state.  Neurological: Negative for dizziness, weakness, light-headedness, numbness and headaches.  All other systems reviewed and are negative.    Physical Exam Updated Vital Signs BP 133/80   Pulse (!) 57   Temp 97.9 F (36.6 C) (Oral)   Resp 20   SpO2 97%   Physical Exam  Constitutional: He is oriented to person, place, and time. He appears well-developed and well-nourished. No distress.  HENT:  Head: Normocephalic and atraumatic.  Eyes: Conjunctivae are normal.  Neck: Neck supple.  Cardiovascular: Normal rate, regular rhythm and normal heart sounds.   Pulmonary/Chest: Effort normal. No respiratory distress. He has no wheezes. He has no rales.  Abdominal: Soft.  Bowel sounds are normal. He exhibits no distension. There is tenderness. There is no rebound.  Left upper quadrant tenderness  Musculoskeletal: He exhibits no edema.  Neurological: He is alert and oriented to person, place, and time.  Skin: Skin is warm and dry.  Nursing note and vitals reviewed.    ED Treatments / Results  Labs (all labs ordered are listed, but only abnormal results are displayed) Labs Reviewed  CBC - Abnormal; Notable  for the following:       Result Value   RBC 3.63 (*)    Hemoglobin 11.6 (*)    HCT 35.6 (*)    Platelets 140 (*)    All other components within normal limits  BASIC METABOLIC PANEL  I-STAT TROPONIN, ED    EKG  EKG Interpretation None       Radiology Dg Chest 2 View  Result Date: 10/08/2017 CLINICAL DATA:  Acute onset of mid to left-sided chest pain at 10:30 a.m. this morning associated with shortness of breath. Current history of hypertension and diabetes. Prior CABG and aortic valve replacement. EXAM: CHEST  2 VIEW COMPARISON:  07/11/2017, 07/05/2017 and earlier, including CT chest 06/25/2017. FINDINGS: AP semi-erect and lateral images were obtained. Prior sternotomy for CABG and aortic valve replacement. Cardiac silhouette markedly enlarged, unchanged. Thoracic aorta atherosclerotic, unchanged. Hilar and mediastinal contours otherwise unremarkable. Mild pulmonary venous hypertension without overt edema. Scarring at the left lung base localizing to the lower lobe, unchanged. Lungs otherwise clear. No pleural effusions. No pneumothorax. Degenerative changes throughout the thoracic and upper lumbar spine. Thoracolumbar dextroscoliosis. IMPRESSION: 1. Stable cardiomegaly. Scarring in the left lower lobe. No acute cardiopulmonary disease. 2.  Aortic Atherosclerosis (ICD10-170.0) Electronically Signed   By: Evangeline Dakin M.D.   On: 10/08/2017 13:30    Procedures Procedures (including critical care time)  Medications Ordered in ED Medications -  No data to display   Initial Impression / Assessment and Plan / ED Course  I have reviewed the triage vital signs and the nursing notes.  Pertinent labs & imaging results that were available during my care of the patient were reviewed by me and considered in my medical decision making (see chart for details).     Patient in emergency department with acute on chronic chest pain that started this morning.  Patient with extensive cardiac disease, has had discussion about noninvasive, palliative care for his cardiac problems.  We will check troponin, EKG, basic labs.  Patient is also having some tenderness in left upper abdomen, question possible.  Will get LFTs and lipase.  Family is asking as if patient's cardiologist can come by and see the patient here in emergency department.  Patient has an appointment with cardiology tomorrow and wife does not want to worry about taking him to the appointment tomorrow. Explained to  them that his cardiologist normally do not come and see patients in ED.  They may need to call the office and see if he still needs to be seen.  Vitals:   10/08/17 1415 10/08/17 1430 10/08/17 1445 10/08/17 1500  BP: 129/65 (!) 121/54 (!) 123/49 133/61  Pulse: (!) 57 (!) 52 (!) 52 (!) 51  Resp: 16 14 13  (!) 27  Temp:      TempSrc:      SpO2: 99% 92% 90% 97%     Final Clinical Impressions(s) / ED Diagnoses   Final diagnoses:  Chest pain, unspecified type  Unstable angina [facility])    New Prescriptions Discharge Medication List as of 10/08/2017  8:31 PM       Jeannett Senior, PA-C 10/09/17 1538

## 2017-10-08 NOTE — ED Triage Notes (Signed)
PER EMS: pt from home with c/o sudden onset of left sided chest pain that started at 1030 this morning after eating breakfast. He was sitting down watching TV at onset. He denies SOB, n/v/d. He took 1g of Tylenol and 648 mg of aspirin prior to EMS arrival. EMS adm 2 SL nitro without relief. 12 lead showed inverted T-waves. Hx of CABG, STEMI, CHF. BP- 122/64, HR-60, 96% RA. [id number]

## 2017-10-08 NOTE — ED Provider Notes (Signed)
6:06 PM Handoff from care [place] PA-C at shift change.   Patient awaiting evaluation from cardiology given chest pain.  Patient currently receiving medical management.  Patient has been seen by Dr. Haroldine Laws.  He has requested patient receive 80 mg of IV Lasix.  Once he has urinary output of about 500cc, he can be discharged.  BP (!) 147/61   Pulse (!) 56   Temp 97.9 F (36.6 C) (Oral)   Resp 20   SpO2 97%     8:29 PM Patient with good urinary output. Will d/c.   BP (!) 161/63   Pulse (!) 53   Temp 97.9 F (36.6 C) (Oral)   Resp (!) 27   SpO2 95%      Carlisle Cater, PA-C 10/08/17 2029    Jola Schmidt, MD 10/10/17 [phone]

## 2017-10-08 NOTE — H&P (Signed)
Advanced Heart Failure Team History and Physical Note   Primary Physician:  Sherrie Mustache NP Primary Cardiologist:  Dr. Aundra Dubin   Reason for Admission: chest pain    HPI:    [name] [name] is a 83 year old male with a past medical history of CAD s/p CABG, bioprosthetic AVR, CKD stage IV, and chronic systolic heart failure (EF 25-30% in July 2018).   He was admitted twice in 7/18, both times with sudden onset dyspnea and chest pressure.  TnI was minimally elevated (0.03-0.05 range).  BNP was elevated.  He improved rapidly with IV diuresis.  V/Q scan was negative.  Echo in 7/18 showed EF 25-30%.  Lasix was increased from 40 mg bid to 60 mg bid then to 80 mg bid.  Last seen in clinic in 07/2017, volume was stable at time of visit.   He presents today to the ED with sudden onset chest pain. He was sitting in his home when he developed intense left sided chest pain that radiated to his lower abdomen and down his left arm. Pain was sharp in nature. He nor his wife were able to find his SL Nitro, so his wife gave him 1g of tylenol and 648 mg of ASA. His pain persisted and EMS was called. He was given 2 SL Nitro without relief of his chest pain. Pain resolved after get morphine in th ER  Upon arrival to the ED, EKG showed Sinus bradycardia with inferolateral T wave inversion consistent with prior EKG's. Troponin 0. 01. Creatinine 2.62 (baseline 2.8-3). He has taken all of his medications today. Weights at home are stable at 154-155 pounds. He denies SOB with walking around his home and outside in the neighborhood. His family says that he is fairly active and he denies exertional chest pain. No recent orthopnea or PND.     Review of Systems: [y] = yes, [ ]  = no   General: Weight gain [ ] ; Weight loss [ ] ; Anorexia [ ] ; Fatigue [ ] ; Fever [ ] ; Chills [ ] ; Weakness [ ]   Cardiac: Chest pain/pressure [ y]; Resting SOB [ ] ; Exertional SOB [ ] ; Orthopnea [ ] ; Pedal Edema [ ] ; Palpitations [ ] ; Syncope [  ]; Presyncope [ ] ; Paroxysmal nocturnal dyspnea[ ]   Pulmonary: Cough [ ] ; Wheezing[ ] ; Hemoptysis[ ] ; Sputum [ ] ; Snoring [ ]   GI: Vomiting[ ] ; Dysphagia[ ] ; Melena[ ] ; Hematochezia [ ] ; Heartburn[ ] ; Abdominal pain [ ] ; Constipation [ ] ; Diarrhea [ ] ; BRBPR [ ]   GU: Hematuria[ ] ; Dysuria [ ] ; Nocturia[ ]   Vascular: Pain in legs with walking [ ] ; Pain in feet with lying flat [ ] ; Non-healing sores [ ] ; Stroke [ ] ; TIA [ ] ; Slurred speech [ ] ;  Neuro: Headaches[ ] ; Vertigo[ ] ; Seizures[ ] ; Paresthesias[ ] ;Blurred vision [ ] ; Diplopia [ ] ; Vision changes [ ]   Ortho/Skin: Arthritis Blue.Reese ]; Joint pain Blue.Reese ]; Muscle pain [ ] ; Joint swelling [ ] ; Back Pain [ ] ; Rash [ ]   Psych: Depression[ ] ; Anxiety[ ]   Heme: Bleeding problems [ ] ; Clotting disorders [ ] ; Anemia [ ]   Endocrine: Diabetes Blue.Reese ]; Thyroid dysfunction[ ]    Home Medications Prior to Admission medications   Medication Sig Start Date End Date Taking? Authorizing Provider  acetaminophen (TYLENOL) 325 MG tablet Take 2 tablets (650 mg total) by mouth every 6 (six) hours as needed for mild pain or headache. 07/08/17  Yes Erlene Quan, PA-C  aspirin EC 81 MG EC tablet Take  1 tablet (81 mg total) by mouth daily. 06/27/17  Yes Shirley Friar, PA-C  carvedilol (COREG) 12.5 MG tablet Take 1 tablet (12.5 mg total) by mouth 2 (two) times daily with a meal. 06/26/17  Yes Tillery, Satira Mccallum, PA-C  ezetimibe (ZETIA) 10 MG tablet Take 1 tablet (10 mg total) by mouth at bedtime. 06/26/17  Yes Shirley Friar, PA-C  furosemide (LASIX) 40 MG tablet Take 2 tablets (80 mg total) by mouth 2 (two) times daily. 07/08/17  Yes Kilroy, Luke K, PA-C  glimepiride (AMARYL) 1 MG tablet TAKE 1MG  BY MOUTH DAILY WITH BREAKFAST 07/08/17  Yes Kilroy, Luke K, PA-C  hydrALAZINE (APRESOLINE) 50 MG tablet Take 1 tablet (50 mg total) by mouth 3 (three) times daily. 06/26/17  Yes Shirley Friar, PA-C  isosorbide mononitrate (IMDUR) 60 MG 24 hr tablet Take 2  tablets (120 mg total) by mouth daily. 07/12/17  Yes Larey Dresser, MD  Multiple Vitamin (MULTIVITAMIN WITH MINERALS) TABS tablet Take 1 tablet by mouth daily.   Yes [provider]  nitroGLYCERIN (NITROSTAT) 0.4 MG SL tablet Place 0.4 mg under the tongue every 5 (five) minutes as needed for chest pain. As needed   Yes [provider]  omeprazole (PRILOSEC) 20 MG capsule Take 20 mg by mouth daily.    Yes [provider]  potassium chloride SA (K-DUR,KLOR-CON) 20 MEQ tablet Take 1 tablet (20 mEq total) by mouth daily. 07/09/17  Yes Kilroy, Lurena Joiner K, PA-C  tamsulosin (FLOMAX) 0.4 MG CAPS capsule TAKE 0.4MG  BY MOUTH EVERY DAY AFTER DINNER 07/08/17  Yes Kilroy, Luke K, PA-C  Travoprost, BAK Free, (TRAVATAN) 0.004 % SOLN ophthalmic solution Place 1 drop into both eyes at bedtime.    Yes [provider]    Past Medical History: Past Medical History:  Diagnosis Date  . Anemia   . Anxiety   . Aortic stenosis    a. s/p AVR with 58mm Edwards pericardial valve 02/07/12 - post-op course complicated by pleural effusion requring thoracentesis/leg cellulitis 03/2012.  . Baker's cyst 07/23/12   Incidental finding of LE venous dopplers  . Blood transfusion    NO REACTION TO TRANSFUSION  . CAD (coronary artery disease)    a. s/p NSTEMI 12/2011:  LHC - Ostial left main 20%, ostial LAD 50%, mid 60-70%, ostial D1 40% and mid 40%, D2 70%, ostial circumflex occluded, ostial RCA 80-90%, LVEDP was 42. b.  s/p CABG x 3 at time of AVR (LiMA-LAD, SVG-2nd daigonal, SVG-PDA) 02/07/12 (post-op course noted above).  . Cancer [facility]) '90's   Colon  . Cataract    right eye, hx of  . Cellulitis    a. RLE cellulitis 2 months post-operatively after AVR/CABG - serratia marcessans, tx with I&D/antibiotics  . CHF (congestive heart failure) ([place])   . Chronic kidney disease   . Chronic systolic heart failure ([place])    a. TEE 01/2012: EF 25-30%, diffuse hypokinesis. b. Not on ACEI due to renal  insufficiency.;  c. follow up  echo 08/06/12: EF 25%, mod diast dysfxn, AVR ok, mild MR, mod LAE, mild RAE, mild to mod RV systolic dysfunction  . Diabetes mellitus    borderline  . Flash pulmonary edema ([place])    Post-cath 12/2011, went into acute pulm edema requiring IV lasix and intubation  . GERD (gastroesophageal reflux disease)   . History of colon cancer    s/p colon resection  . HLD (hyperlipidemia)   . HTN (hypertension)    primary, Dr.  [name] [name]  . Ischemic cardiomyopathy   . Mitral regurgitation    Moderate by TEE 01/2012  . Myocardial infarction ([place]) 12/2011  . Pleural effusion    a. post-operatively after AVR/CABG s/p thoracentesis 03/2012 yielding 1L serosanguinous fluid.  . Stroke ([place]) 10/01   RIght Leg weakness    Past Surgical History: Past Surgical History:  Procedure Laterality Date  . AORTIC VALVE REPLACEMENT  02/07/2012   Procedure: AORTIC VALVE REPLACEMENT (AVR);  Surgeon: Gaye Pollack, MD;  Location: [place];  Service: Open Heart Surgery;  Laterality: N/A;  . CARDIAC CATHETERIZATION     1.3.13  stopped breathing, put on ventilator for 5-6 days  . CATARACT EXTRACTION     rt  . CHEST TUBE INSERTION  07/24/2012   Procedure: INSERTION PLEURAL DRAINAGE CATHETER;  Surgeon: Gaye Pollack, MD;  Location: [place];  Service: Thoracic;  Laterality: Left;  . COLON RESECTION  1996  . COLONOSCOPY    . CORONARY ARTERY BYPASS GRAFT  02/07/2012   Procedure: CORONARY ARTERY BYPASS GRAFTING (CABG);  Surgeon: Gaye Pollack, MD;  Location: [place];  Service: Open Heart Surgery;  Laterality: N/A;  CABG x three; using right leg greater saphenous vein harvested endoscopically  . ERCP N/A 07/08/2014   Procedure: ENDOSCOPIC RETROGRADE CHOLANGIOPANCREATOGRAPHY (ERCP);  Surgeon: Missy Sabins, MD;  Location[facility] ENDOSCOPY;  Service: Endoscopy;  Laterality: N/A;  . ESOPHAGEAL DILATION    . ESOPHAGOGASTRODUODENOSCOPY N/A 07/03/2014   Procedure: ESOPHAGOGASTRODUODENOSCOPY (EGD);  Surgeon:  Arta Silence, MD;  Location: [facility] ENDOSCOPY;  Service: Endoscopy;  Laterality: N/A;  . ESOPHAGOSCOPY WITH DILITATION N/A 07/07/2014   Procedure: ESOPHAGOSCOPY WITH DILITATION/SAVARY DILATOR;  Surgeon: Rozetta Nunnery, MD;  [facility];  Service: ENT;  Laterality: N/A;  . EYE SURGERY  2009   cataract removed from right eye  . INGUINAL HERNIA REPAIR Right 09/13/2015   Procedure: OPEN RIGHT INGUINAL HERNIA;  Surgeon: Mickeal Skinner, MD;  Location: [place];  Service: General;  Laterality: Right;  . INSERTION OF MESH Right 09/13/2015   Procedure: INSERTION OF MESH;  Surgeon: Mickeal Skinner, MD;  Location: [place];  Service: General;  Laterality: Right;  . LEFT HEART CATHETERIZATION WITH CORONARY ANGIOGRAM N/A 12/13/2011   Procedure: LEFT HEART CATHETERIZATION WITH CORONARY ANGIOGRAM;  Surgeon: Peter M Martinique, MD;  Location[facility] CATH LAB;  Service: Cardiovascular;  Laterality: N/A;  . REMOVAL OF PLEURAL DRAINAGE CATHETER  09/27/2012   Procedure: REMOVAL OF PLEURAL DRAINAGE CATHETER;  Surgeon: Gaye Pollack, MD;  Location: [place];  Service: Thoracic;  Laterality: Left;  . TALC PLEURODESIS  08/30/2012   Procedure: Pietro Cassis;  Surgeon: Gaye Pollack, MD;  Location: [place];  Service: Thoracic;  Laterality: Left;  INSERTION OF TALC VIA LEFT PLEURX  . TALC PLEURODESIS  09/12/2012   Procedure: Pietro Cassis;  Surgeon: Gaye Pollack, MD;  Location: [place];  Service: Thoracic;  Laterality: Left;  . TONSILLECTOMY      Family History:  Family History  Problem Relation Age of Onset  . Cancer Mother   . Heart attack Father   . Mental illness Brother   . Kidney failure Brother     Social History: Social History   Social History  . Marital status: Married    Spouse name: N/A  . Number of children: N/A  . Years of education: N/A   Occupational History  . retired owned [employer]    Social History Main Topics  . Smoking  status: Never Smoker  . Smokeless tobacco: Never  Used  . Alcohol use No  . Drug use: No  . Sexual activity: Not Currently   Other Topics Concern  . None   Social History Narrative   Married Angelita Ingles   Never smoked   Alcohol none   Exercise walking, weights   Living Will, MOST form       Allergies:  Allergies  Allergen Reactions  . Statins Other (See Comments)    Pain/weakness in legs  . Ambien [Zolpidem Tartrate] Anxiety and Other (See Comments)    Anxiety and hallucinations     Objective:    Vital Signs:   Temp:  [97.9 F (36.6 C)] 97.9 F (36.6 C) (10/29 1248) Pulse Rate:  [50-58] 51 (10/29 1645) Resp:  [13-32] 23 (10/29 1645) BP: (119-152)/(49-80) 152/64 (10/29 1645) SpO2:  [90 %-99 %] 97 % (10/29 1645)     Physical Exam     General:  Elderly appearing. No respiratory difficulty HEENT: Normal Neck: Supple. JVP 10-11 with prominent CV waves Carotids 2+ bilat; no bruits. No lymphadenopathy or thyromegaly appreciated. Cor: PMI nondisplaced. Regular rate & rhythm. 1/6 SEM at RSB Lungs: Clear Abdomen: Soft, nontender, nondistended. No hepatosplenomegaly. No bruits or masses. Good bowel sounds. Extremities: No cyanosis, clubbing, rash, edema Neuro: Alert & oriented x 3, cranial nerves grossly intact. moves all 4 extremities w/o difficulty. Affect pleasant.   Telemetry   Sinus brady -56 - reviewed personally   EKG   Sinus bradycardia, inferolateral T wave inversion - personally reviewed.   Labs     Basic Metabolic Panel:  Recent Labs Lab 10/08/17 1347  NA 138  K 4.4  CL 99*  CO2 29  GLUCOSE 123*  BUN 59*  CREATININE 2.62*  CALCIUM 9.1    Liver Function Tests:  Recent Labs Lab 10/08/17 1514  AST 24  ALT 18  ALKPHOS 49  BILITOT 0.8  PROT 6.7  ALBUMIN 3.4*    Recent Labs Lab 10/08/17 1514  LIPASE 26   No results for input(s): AMMONIA in the last 168 hours.  CBC:  Recent Labs Lab 10/08/17 1347  WBC 6.2  HGB 11.6*  HCT 35.6*  MCV 98.1  PLT 140*    Cardiac  Enzymes: No results for input(s): CKTOTAL, CKMB, CKMBINDEX, TROPONINI in the last 168 hours.  BNP: BNP (last 3 results)  Recent Labs  06/25/17 1114 07/05/17 1517 07/11/17 1734  BNP 491.0* 529.6* 281.4*    ProBNP (last 3 results) No results for input(s): PROBNP in the last 8760 hours.   CBG: No results for input(s): GLUCAP in the last 168 hours.  Coagulation Studies: No results for input(s): LABPROT, INR in the last 72 hours.  Imaging: Dg Chest 2 View  Result Date: 10/08/2017 CLINICAL DATA:  Acute onset of mid to left-sided chest pain at 10:30 a.m. this morning associated with shortness of breath. Current history of hypertension and diabetes. Prior CABG and aortic valve replacement. EXAM: CHEST  2 VIEW COMPARISON:  07/11/2017, 07/05/2017 and earlier, including CT chest 06/25/2017. FINDINGS: AP semi-erect and lateral images were obtained. Prior sternotomy for CABG and aortic valve replacement. Cardiac silhouette markedly enlarged, unchanged. Thoracic aorta atherosclerotic, unchanged. Hilar and mediastinal contours otherwise unremarkable. Mild pulmonary venous hypertension without overt edema. Scarring at the left lung base localizing to the lower lobe, unchanged. Lungs otherwise clear. No pleural effusions. No pneumothorax. Degenerative changes throughout the thoracic and upper lumbar spine. Thoracolumbar dextroscoliosis. IMPRESSION: 1. Stable cardiomegaly. Scarring in the left  lower lobe. No acute cardiopulmonary disease. 2.  Aortic Atherosclerosis (ICD10-170.0) Electronically Signed   By: Evangeline Dakin M.D.   On: 10/08/2017 13:30      Patient Profile   [name] [name] is a 83 year old male with a past medical history of CAD s/p CABG, bioprosthetic AVR, CKD stage IV, and chronic systolic heart failure (EF 25-30% in July 2018).   Assessment/Plan   1. Chest pain: Patient with history of CAD and remote CABG. This is his third episode of chest pain since 06/2017. Cardiac cath was  avoided due to CKD - creatinine 2.8-3. Troponin negative so far and EKG without acute ST changes. His pain is not typical for ACS, but with history of CAD could be possible. Would not pursue ischemic evaluation with CKD and advanced age. He is on a good dose of isosorbide. Would not increase Coreg as HR is in the 50's.  - Would get another troponin and if less than 0.1 ok to dc.  - Continue ASA and Zetia.   2. Acute on chronic systolic CHF: ICM. EF 25-30% in 06/2017, down from EF 40-45% in 10/2015.  - NYHA III - Continue lasix 80 mg BID.  - Continue Coreg 12.5 mg BID - Continue hydralazine and Imdur.  - No ACE/ARB/Spiro with CKD.   3. Aortic valve replacement: Bioprosthetic.   - Valve looked stable on 7/18 echo. No change.    4. CKD stage IV:  - Baseline creatinine 2.8-3.0.  - Creatinine stable.    Arbutus Leas, NP 10/08/2017, 5:15 PM  Advanced Heart Failure Team Pager [phone] (M-F; 7a - 4p)  Please contact [facility] for night-coverage after hours (4p -7a ) and weekends on [email or web address]  Patient seen and examined with Jettie Booze, NP. We discussed all aspects of the encounter. I agree with the assessment and plan as stated above.   CP is quite atypical. Relieved with morphine. ECG without any acute changes. and troponin is normal. He has mild volume overload. Will give one dose IV lasix and check a repeat troponin. Likely can go home tonight. Discussed with EDP.   Glori Bickers, MD  6:15 PM

## 2017-10-08 NOTE — Discharge Instructions (Signed)
Please follow up with cardiology

## 2017-10-09 ENCOUNTER — Ambulatory Visit (HOSPITAL_COMMUNITY)
Admission: RE | Admit: 2017-10-09 | Discharge: 2017-10-09 | Disposition: A | Discharge status: Home / Self Care | Payer: Medicare Other | Source: Ambulatory Visit | Attending: Cardiology | Admitting: Cardiology

## 2017-10-09 ENCOUNTER — Encounter (HOSPITAL_COMMUNITY): Payer: Self-pay | Admitting: Cardiology

## 2017-10-09 VITALS — BP 147/59 | HR 66 | Wt 156.2 lb

## 2017-10-09 DIAGNOSIS — N179 Acute kidney failure, unspecified: Secondary | ICD-10-CM | POA: Insufficient documentation

## 2017-10-09 DIAGNOSIS — Z888 Allergy status to other drugs, medicaments and biological substances status: Secondary | ICD-10-CM | POA: Insufficient documentation

## 2017-10-09 DIAGNOSIS — D649 Anemia, unspecified: Secondary | ICD-10-CM | POA: Insufficient documentation

## 2017-10-09 DIAGNOSIS — Z7984 Long term (current) use of oral hypoglycemic drugs: Secondary | ICD-10-CM | POA: Diagnosis not present

## 2017-10-09 DIAGNOSIS — E1122 Type 2 diabetes mellitus with diabetic chronic kidney disease: Secondary | ICD-10-CM | POA: Insufficient documentation

## 2017-10-09 DIAGNOSIS — K219 Gastro-esophageal reflux disease without esophagitis: Secondary | ICD-10-CM | POA: Diagnosis not present

## 2017-10-09 DIAGNOSIS — N183 Chronic kidney disease, stage 3 unspecified: Secondary | ICD-10-CM

## 2017-10-09 DIAGNOSIS — Z9889 Other specified postprocedural states: Secondary | ICD-10-CM | POA: Insufficient documentation

## 2017-10-09 DIAGNOSIS — Z79899 Other long term (current) drug therapy: Secondary | ICD-10-CM | POA: Insufficient documentation

## 2017-10-09 DIAGNOSIS — I5042 Chronic combined systolic (congestive) and diastolic (congestive) heart failure: Secondary | ICD-10-CM | POA: Diagnosis not present

## 2017-10-09 DIAGNOSIS — Z841 Family history of disorders of kidney and ureter: Secondary | ICD-10-CM | POA: Insufficient documentation

## 2017-10-09 DIAGNOSIS — I255 Ischemic cardiomyopathy: Secondary | ICD-10-CM | POA: Insufficient documentation

## 2017-10-09 DIAGNOSIS — I340 Nonrheumatic mitral (valve) insufficiency: Secondary | ICD-10-CM | POA: Diagnosis not present

## 2017-10-09 DIAGNOSIS — Z8673 Personal history of transient ischemic attack (TIA), and cerebral infarction without residual deficits: Secondary | ICD-10-CM | POA: Diagnosis not present

## 2017-10-09 DIAGNOSIS — Z7982 Long term (current) use of aspirin: Secondary | ICD-10-CM | POA: Insufficient documentation

## 2017-10-09 DIAGNOSIS — Z952 Presence of prosthetic heart valve: Secondary | ICD-10-CM

## 2017-10-09 DIAGNOSIS — Z951 Presence of aortocoronary bypass graft: Secondary | ICD-10-CM | POA: Insufficient documentation

## 2017-10-09 DIAGNOSIS — Z8249 Family history of ischemic heart disease and other diseases of the circulatory system: Secondary | ICD-10-CM | POA: Insufficient documentation

## 2017-10-09 DIAGNOSIS — I5022 Chronic systolic (congestive) heart failure: Secondary | ICD-10-CM | POA: Diagnosis not present

## 2017-10-09 DIAGNOSIS — Z953 Presence of xenogenic heart valve: Secondary | ICD-10-CM | POA: Diagnosis not present

## 2017-10-09 DIAGNOSIS — Z9049 Acquired absence of other specified parts of digestive tract: Secondary | ICD-10-CM | POA: Insufficient documentation

## 2017-10-09 DIAGNOSIS — E785 Hyperlipidemia, unspecified: Secondary | ICD-10-CM | POA: Insufficient documentation

## 2017-10-09 DIAGNOSIS — I130 Hypertensive heart and chronic kidney disease with heart failure and stage 1 through stage 4 chronic kidney disease, or unspecified chronic kidney disease: Secondary | ICD-10-CM | POA: Diagnosis not present

## 2017-10-09 DIAGNOSIS — I252 Old myocardial infarction: Secondary | ICD-10-CM | POA: Insufficient documentation

## 2017-10-09 DIAGNOSIS — N184 Chronic kidney disease, stage 4 (severe): Secondary | ICD-10-CM | POA: Insufficient documentation

## 2017-10-09 DIAGNOSIS — Y95 Nosocomial condition: Secondary | ICD-10-CM | POA: Diagnosis not present

## 2017-10-09 DIAGNOSIS — Z85038 Personal history of other malignant neoplasm of large intestine: Secondary | ICD-10-CM | POA: Insufficient documentation

## 2017-10-09 DIAGNOSIS — I350 Nonrheumatic aortic (valve) stenosis: Secondary | ICD-10-CM | POA: Diagnosis not present

## 2017-10-09 DIAGNOSIS — F419 Anxiety disorder, unspecified: Secondary | ICD-10-CM | POA: Diagnosis not present

## 2017-10-09 DIAGNOSIS — I2510 Atherosclerotic heart disease of native coronary artery without angina pectoris: Secondary | ICD-10-CM | POA: Diagnosis not present

## 2017-10-09 MED ORDER — FUROSEMIDE 40 MG PO TABS: 80.0000 mg | ORAL_TABLET | Freq: Two times a day (BID) | ORAL | 6 refills | Status: DC

## 2017-10-09 MED ORDER — NITROGLYCERIN 0.4 MG SL SUBL: 0.4000 mg | SUBLINGUAL_TABLET | SUBLINGUAL | 3 refills | Status: AC | PRN

## 2017-10-09 NOTE — Patient Instructions (Signed)
Take extra 80 mg (2 tabs) for shortness of breath and chest tightness as needed   Your physician recommends that you schedule a follow-up appointment in: 6 weeks

## 2017-10-10 NOTE — Progress Notes (Signed)
Patient ID: [name] [name], male   DOB: 05/07/1926, 83 y.o.   MRN: [id number]     Advanced Heart Failure Clinic Note   PCP: Hollace Kinnier HF: Dr. Aundra Dubin   HPI: [name] [name] is an 83 y.o. male with a PMHx of CAD, CABG, bioprosthetic AVR, anemia, CKD stage 4, chronic systolic heart failure. He is not on an ACE or ARB due renal failure.   Admitted to [facility] 4/28 through 04/15/13  with volume overload. Diuresed with IV lasix. He was not discharged on diuretics. Discharge weight 139 pounds.   He had a hernia repair in 10/16 and developed volume overload after.  Lasix was increased but he had progressive dyspnea.  He was readmitted and found to have AKI with anemia and HCAP.  He was treated for PNA and transfused 2 units PRBCs.  He was discharged to short-term rehab and remains there today.   Echo in 10/16 showed EF 40-45%, inferior/inferolateral/inferoseptal akinesis, bioprosthetic AoV with mean gradient 12 mmHg, RV with moderately decreased systolic function, PASP 54 mmHg.   He was admitted in 11/16 with aspiration PNA after inguinal hernia surgery.   He was admitted twice in 7/18, both times with sudden onset dyspnea and chest pressure.  TnI was minimally elevated (0.03-0.05 range).  BNP was elevated.  He improved rapidly with IV diuresis.  V/Q scan was negative.  Echo in 7/18 showed EF 25-30%.  Lasix was increased from 40 mg bid to 60 mg bid then to 80 mg bid.    He had been doing well until yesterday, when he had another episode of sudden-onset left-sided chest/upper abdomen pain/pressure.  It lasted about 8-9 hours continuously.  He went to the ER, ECG was unchanged and cardiac enzymes were negative x 2.  He felt better after getting a dose of IV Lasix.  This was the first episode of this since July.   Today, he has no complaints.  No further chest pain.  Weight is stable.  No dyspnea walking on flat ground but gets a bit fatigued.   ECG (personally reviewed): NSR, LVH with repolarization  abnormality  Labs: 05/28/13 Creatinine 2.37 Potassium 4.3 7/14 Creatinine 2.2 3/15 HCT 35.9 10/16 K 3.5, creatinine 2.82, HCT 29.6 11/16 K 4.2, creatinine 2.2, HCT 33.3, plts 127 4/18 K 4.8, creatinine 2.38 8/18 K 4.6, creatinine 3.13 10/18: K 4.4, creatinine 2.62, hgb 11.6  Past Medical History:  Diagnosis Date  . Anemia   . Anxiety   . Aortic stenosis    a. s/p AVR with 5mm Edwards pericardial valve 02/07/12 - post-op course complicated by pleural effusion requring thoracentesis/leg cellulitis 03/2012.  . Baker's cyst 07/23/12   Incidental finding of LE venous dopplers  . Blood transfusion    NO REACTION TO TRANSFUSION  . CAD (coronary artery disease)    a. s/p NSTEMI 12/2011:  LHC - Ostial left main 20%, ostial LAD 50%, mid 60-70%, ostial D1 40% and mid 40%, D2 70%, ostial circumflex occluded, ostial RCA 80-90%, LVEDP was 42. b.  s/p CABG x 3 at time of AVR (LiMA-LAD, SVG-2nd daigonal, SVG-PDA) 02/07/12 (post-op course noted above).  . Cancer [facility]) '90's   Colon  . Cataract    right eye, hx of  . Cellulitis    a. RLE cellulitis 2 months post-operatively after AVR/CABG - serratia marcessans, tx with I&D/antibiotics  . CHF (congestive heart failure) ([place])   . Chronic kidney disease   . Chronic systolic heart failure ([place])    a. TEE 01/2012: EF  25-30%, diffuse hypokinesis. b. Not on ACEI due to renal insufficiency.;  c. follow up  echo 08/06/12: EF 25%, mod diast dysfxn, AVR ok, mild [name], mod LAE, mild RAE, mild to mod RV systolic dysfunction  . Diabetes mellitus    borderline  . Flash pulmonary edema ([place])    Post-cath 12/2011, went into acute pulm edema requiring IV lasix and intubation  . GERD (gastroesophageal reflux disease)   . History of colon cancer    s/p colon resection  . HLD (hyperlipidemia)   . HTN (hypertension)    primary, Dr. Hollace Kinnier  . Ischemic cardiomyopathy   . Mitral regurgitation    Moderate by TEE 01/2012  . Myocardial infarction ([place]) 12/2011  .  Pleural effusion    a. post-operatively after AVR/CABG s/p thoracentesis 03/2012 yielding 1L serosanguinous fluid.  . Stroke ([place]) 10/01   RIght Leg weakness    Current Outpatient Prescriptions  Medication Sig Dispense Refill  . acetaminophen (TYLENOL) 325 MG tablet Take 2 tablets (650 mg total) by mouth every 6 (six) hours as needed for mild pain or headache.    Marland Kitchen aspirin EC 81 MG EC tablet Take 1 tablet (81 mg total) by mouth daily. 30 tablet 6  . carvedilol (COREG) 12.5 MG tablet Take 1 tablet (12.5 mg total) by mouth 2 (two) times daily with a meal. 60 tablet 6  . ezetimibe (ZETIA) 10 MG tablet Take 1 tablet (10 mg total) by mouth at bedtime. 30 tablet 6  . furosemide (LASIX) 40 MG tablet Take 2 tablets (80 mg total) by mouth 2 (two) times daily. May take additional 80 mg (2 tabs) as needed 140 tablet 6  . glimepiride (AMARYL) 1 MG tablet TAKE 1MG  BY MOUTH DAILY WITH BREAKFAST 90 tablet 1  . hydrALAZINE (APRESOLINE) 50 MG tablet Take 1 tablet (50 mg total) by mouth 3 (three) times daily. 90 tablet 6  . isosorbide mononitrate (IMDUR) 60 MG 24 hr tablet Take 2 tablets (120 mg total) by mouth daily. 180 tablet 3  . Multiple Vitamin (MULTIVITAMIN WITH MINERALS) TABS tablet Take 1 tablet by mouth daily.    . nitroGLYCERIN (NITROSTAT) 0.4 MG SL tablet Place 1 tablet (0.4 mg total) under the tongue every 5 (five) minutes as needed for chest pain. As needed 15 tablet 3  . omeprazole (PRILOSEC) 20 MG capsule Take 20 mg by mouth daily.     . potassium chloride SA (K-DUR,KLOR-CON) 20 MEQ tablet Take 1 tablet (20 mEq total) by mouth daily. 30 tablet 5  . tamsulosin (FLOMAX) 0.4 MG CAPS capsule TAKE 0.4MG  BY MOUTH EVERY DAY AFTER DINNER 90 capsule 0  . Travoprost, BAK Free, (TRAVATAN) 0.004 % SOLN ophthalmic solution Place 1 drop into both eyes at bedtime.      No current facility-administered medications for this encounter.      Allergies  Allergen Reactions  . Statins Other (See Comments)     Pain/weakness in legs  . Ambien [Zolpidem Tartrate] Anxiety and Other (See Comments)    Anxiety and hallucinations     Social History   Social History  . Marital status: Married    Spouse name: N/A  . Number of children: N/A  . Years of education: N/A   Occupational History  . retired owned [employer]    Social History Main Topics  . Smoking status: Never Smoker  . Smokeless tobacco: Never Used  . Alcohol use No  . Drug use: No  . Sexual activity: Not Currently  Other Topics Concern  . Not on file   Social History Narrative   Married Angelita Ingles   Never smoked   Alcohol none   Exercise walking, weights   Living Will, MOST form       Family History  Problem Relation Age of Onset  . Cancer Mother   . Heart attack Father   . Mental illness Brother   . Kidney failure Brother     PHYSICAL EXAM: Vitals:   10/09/17 1528  BP: (!) 147/59  Pulse: 66  SpO2: 98%  Weight: 156 lb 4 oz (70.9 kg)   Wt Readings from Last 3 Encounters:  10/09/17 156 lb 4 oz (70.9 kg)  10/01/17 156 lb (70.8 kg)  07/31/17 155 lb 9.6 oz (70.6 kg)   General: NAD Neck: No JVD, no thyromegaly or thyroid nodule.  Lungs: Clear to auscultation bilaterally with normal respiratory effort. CV: Nondisplaced PMI.  Heart regular S1/S2, no S3/S4, 2/6 SEM RUSB.  No peripheral edema.  No carotid bruit.  Normal pedal pulses.  Abdomen: Soft, nontender, no hepatosplenomegaly, no distention.  Skin: Intact without lesions or rashes.  Neurologic: Alert and oriented x 3.  Psych: Normal affect. Extremities: No clubbing or cyanosis.  HEENT: Normal.   ASSESSMENT & PLAN: 1. CAD: S/p CABG.  He presented to hospital twice in 7/18 with sudden onset dyspnea and chest tightness.  He had another episode yesterday.  Troponin was negative despite prolonged pain and symptoms resolved with a dose of Lasix IV. In absence of definite MI, would avoid cardiac cath with CKD stage 4, continue to treat symptomatically  (therefore, will not do stress test).  - Continue ASA 81 and Zetia 10 mg daily.   - No statin with myalgias. - Continue current beta blocker and Imdur 120 mg daily.   - He does not have sublingual NTG, I will prescribe.  2. Aortic valve replacement: Bioprosthetic. Valve looked stable on 7/18 echo. No change.  3. Chronic systolic CHF: Ischemic cardiomyopathy.  EF 25-30% on 7/18 echo, down from EF 40-45%.  He does not look volume overloaded on exam.  NYHA class II symptoms.  - Continue lasix 80 mg bid. I wonder if his chest pain/dyspnea episodes are not due to fluid shifts as he tends to improve with Lasix.  I think he is on a good dose of Lasix chronically, but will have him take an extra Lasix 80 mg po if he has another episode like yesterday.  This may help keep him out of the hospital.  - Continue coreg 12.5 mg BID - Continue hydralazine 50 mg TID - Continue Imdur 120 mg daily.  - Not on ACEI because of CKD.   - Reinforced fluid restriction to < 2 L daily, sodium restriction to less than 2000 mg daily, and the importance of daily weights.   4. CKD stage IV: Suspect this makes volume management more difficult and may have predisposed him to flash pulmonary edema with dyspnea/chest pain.  - Will not increase HTN meds to preserve renal perfusion.   Followup in 6 wks.   Loralie Champagne, MD  10/10/2017

## 2017-10-16 ENCOUNTER — Other Ambulatory Visit: Payer: Self-pay | Admitting: Nurse Practitioner

## 2017-10-18 ENCOUNTER — Telehealth: Payer: Self-pay | Admitting: Internal Medicine

## 2017-10-18 NOTE — Telephone Encounter (Signed)
FYI,  I called patient again today to follow up on this referral. I finally got in touch with the patient and he stated he did not know anything about this referral. I tried to explain to the patient that this was discussed at his last visit with Dr. Mariea Clonts for Palliative care and that they have been trying to reach him to schedule a start of care. Patient stated he must be going crazy because he had no idea what I was talking about and never discussed this with Dr. Mariea Clonts and never received a call from [facility]. I tried to explain to him that they had be trying to reach him and I had made several attempts to speak with him as well. Patient stated again he did not know what I was talking about and at this time he did not want to pursue anything but thanked me for my call. I informed patient that I would notify Dr. Mariea Clonts.    (In basket referral message sent to Dr. Mariea Clonts & [place])

## 2017-11-06 NOTE — Telephone Encounter (Signed)
Noted.  Not sure what's going on with this.  Will discuss at his next visit again.

## 2017-11-06 NOTE — Telephone Encounter (Signed)
Referral deferred x 60 days

## 2017-11-06 NOTE — Telephone Encounter (Signed)
Last attempt to contact patient/family regarding referral to Hospice was made on 10/30/17 with no return call.   Referral to be deferred upon acknowledgement from Hollace Kinnier L, DO

## 2017-11-28 ENCOUNTER — Other Ambulatory Visit: Payer: Self-pay | Admitting: *Deleted

## 2017-11-28 MED ORDER — TAMSULOSIN HCL 0.4 MG PO CAPS: ORAL_CAPSULE | ORAL | 1 refills | Status: DC

## 2017-11-28 NOTE — Telephone Encounter (Signed)
[facility] 

## 2017-12-08 ENCOUNTER — Other Ambulatory Visit: Payer: Self-pay | Admitting: Internal Medicine

## 2017-12-12 ENCOUNTER — Other Ambulatory Visit: Payer: Self-pay

## 2017-12-12 ENCOUNTER — Telehealth (HOSPITAL_COMMUNITY): Payer: Self-pay | Admitting: *Deleted

## 2017-12-12 ENCOUNTER — Inpatient Hospital Stay (HOSPITAL_COMMUNITY)
Admission: EM | Admit: 2017-12-12 | Discharge: 2017-12-19 | DRG: 178 | Disposition: A | Discharge status: Home Health | Payer: Medicare Other | Attending: Internal Medicine | Admitting: Internal Medicine

## 2017-12-12 ENCOUNTER — Encounter (HOSPITAL_COMMUNITY): Payer: Self-pay

## 2017-12-12 ENCOUNTER — Emergency Department (HOSPITAL_COMMUNITY): Payer: Medicare Other

## 2017-12-12 ENCOUNTER — Observation Stay (HOSPITAL_COMMUNITY): Payer: Medicare Other

## 2017-12-12 ENCOUNTER — Encounter (HOSPITAL_COMMUNITY): Payer: Medicare Other | Admitting: Cardiology

## 2017-12-12 DIAGNOSIS — I252 Old myocardial infarction: Secondary | ICD-10-CM

## 2017-12-12 DIAGNOSIS — Z85038 Personal history of other malignant neoplasm of large intestine: Secondary | ICD-10-CM

## 2017-12-12 DIAGNOSIS — Z7982 Long term (current) use of aspirin: Secondary | ICD-10-CM

## 2017-12-12 DIAGNOSIS — I480 Paroxysmal atrial fibrillation: Secondary | ICD-10-CM | POA: Diagnosis present

## 2017-12-12 DIAGNOSIS — K219 Gastro-esophageal reflux disease without esophagitis: Secondary | ICD-10-CM | POA: Diagnosis present

## 2017-12-12 DIAGNOSIS — T502X5A Adverse effect of carbonic-anhydrase inhibitors, benzothiadiazides and other diuretics, initial encounter: Secondary | ICD-10-CM | POA: Diagnosis not present

## 2017-12-12 DIAGNOSIS — R001 Bradycardia, unspecified: Secondary | ICD-10-CM | POA: Diagnosis not present

## 2017-12-12 DIAGNOSIS — R079 Chest pain, unspecified: Secondary | ICD-10-CM | POA: Diagnosis not present

## 2017-12-12 DIAGNOSIS — R404 Transient alteration of awareness: Secondary | ICD-10-CM | POA: Diagnosis not present

## 2017-12-12 DIAGNOSIS — R1312 Dysphagia, oropharyngeal phase: Secondary | ICD-10-CM | POA: Diagnosis present

## 2017-12-12 DIAGNOSIS — R55 Syncope and collapse: Secondary | ICD-10-CM | POA: Diagnosis not present

## 2017-12-12 DIAGNOSIS — E118 Type 2 diabetes mellitus with unspecified complications: Secondary | ICD-10-CM | POA: Diagnosis not present

## 2017-12-12 DIAGNOSIS — K4090 Unilateral inguinal hernia, without obstruction or gangrene, not specified as recurrent: Secondary | ICD-10-CM | POA: Diagnosis not present

## 2017-12-12 DIAGNOSIS — Z953 Presence of xenogenic heart valve: Secondary | ICD-10-CM

## 2017-12-12 DIAGNOSIS — R0602 Shortness of breath: Secondary | ICD-10-CM | POA: Diagnosis not present

## 2017-12-12 DIAGNOSIS — I208 Other forms of angina pectoris: Secondary | ICD-10-CM

## 2017-12-12 DIAGNOSIS — Z841 Family history of disorders of kidney and ureter: Secondary | ICD-10-CM

## 2017-12-12 DIAGNOSIS — K56609 Unspecified intestinal obstruction, unspecified as to partial versus complete obstruction: Secondary | ICD-10-CM

## 2017-12-12 DIAGNOSIS — R0789 Other chest pain: Secondary | ICD-10-CM

## 2017-12-12 DIAGNOSIS — I255 Ischemic cardiomyopathy: Secondary | ICD-10-CM | POA: Diagnosis present

## 2017-12-12 DIAGNOSIS — I69341 Monoplegia of lower limb following cerebral infarction affecting right dominant side: Secondary | ICD-10-CM

## 2017-12-12 DIAGNOSIS — J690 Pneumonitis due to inhalation of food and vomit: Principal | ICD-10-CM | POA: Diagnosis present

## 2017-12-12 DIAGNOSIS — Z888 Allergy status to other drugs, medicaments and biological substances status: Secondary | ICD-10-CM

## 2017-12-12 DIAGNOSIS — J181 Lobar pneumonia, unspecified organism: Secondary | ICD-10-CM

## 2017-12-12 DIAGNOSIS — N184 Chronic kidney disease, stage 4 (severe): Secondary | ICD-10-CM | POA: Diagnosis not present

## 2017-12-12 DIAGNOSIS — I5042 Chronic combined systolic (congestive) and diastolic (congestive) heart failure: Secondary | ICD-10-CM | POA: Diagnosis present

## 2017-12-12 DIAGNOSIS — Z7983 Long term (current) use of bisphosphonates: Secondary | ICD-10-CM

## 2017-12-12 DIAGNOSIS — E785 Hyperlipidemia, unspecified: Secondary | ICD-10-CM | POA: Diagnosis present

## 2017-12-12 DIAGNOSIS — J189 Pneumonia, unspecified organism: Secondary | ICD-10-CM | POA: Diagnosis present

## 2017-12-12 DIAGNOSIS — R1310 Dysphagia, unspecified: Secondary | ICD-10-CM

## 2017-12-12 DIAGNOSIS — N179 Acute kidney failure, unspecified: Secondary | ICD-10-CM | POA: Diagnosis not present

## 2017-12-12 DIAGNOSIS — E1122 Type 2 diabetes mellitus with diabetic chronic kidney disease: Secondary | ICD-10-CM | POA: Diagnosis present

## 2017-12-12 DIAGNOSIS — Z951 Presence of aortocoronary bypass graft: Secondary | ICD-10-CM

## 2017-12-12 DIAGNOSIS — I2510 Atherosclerotic heart disease of native coronary artery without angina pectoris: Secondary | ICD-10-CM | POA: Diagnosis present

## 2017-12-12 DIAGNOSIS — Z66 Do not resuscitate: Secondary | ICD-10-CM | POA: Diagnosis present

## 2017-12-12 DIAGNOSIS — F419 Anxiety disorder, unspecified: Secondary | ICD-10-CM | POA: Diagnosis present

## 2017-12-12 DIAGNOSIS — I130 Hypertensive heart and chronic kidney disease with heart failure and stage 1 through stage 4 chronic kidney disease, or unspecified chronic kidney disease: Secondary | ICD-10-CM | POA: Diagnosis not present

## 2017-12-12 DIAGNOSIS — R072 Precordial pain: Secondary | ICD-10-CM

## 2017-12-12 DIAGNOSIS — E860 Dehydration: Secondary | ICD-10-CM | POA: Diagnosis present

## 2017-12-12 DIAGNOSIS — Z8249 Family history of ischemic heart disease and other diseases of the circulatory system: Secondary | ICD-10-CM

## 2017-12-12 DIAGNOSIS — K5900 Constipation, unspecified: Secondary | ICD-10-CM | POA: Diagnosis present

## 2017-12-12 DIAGNOSIS — Z9049 Acquired absence of other specified parts of digestive tract: Secondary | ICD-10-CM

## 2017-12-12 DIAGNOSIS — D631 Anemia in chronic kidney disease: Secondary | ICD-10-CM | POA: Diagnosis present

## 2017-12-12 DIAGNOSIS — I340 Nonrheumatic mitral (valve) insufficiency: Secondary | ICD-10-CM | POA: Diagnosis present

## 2017-12-12 DIAGNOSIS — Z79899 Other long term (current) drug therapy: Secondary | ICD-10-CM

## 2017-12-12 LAB — BASIC METABOLIC PANEL
Anion gap: 8 (ref 5–15)
BUN: 68 mg/dL — ABNORMAL HIGH (ref 6–20)
CHLORIDE: 100 mmol/L — AB (ref 101–111)
CO2: 30 mmol/L (ref 22–32)
CREATININE: 2.88 mg/dL — AB (ref 0.61–1.24)
Calcium: 9.3 mg/dL (ref 8.9–10.3)
GFR calc non Af Amer: 18 mL/min — ABNORMAL LOW (ref >60)
GFR, EST AFRICAN AMERICAN: 21 mL/min — AB (ref >60)
Glucose, Bld: 142 mg/dL — ABNORMAL HIGH (ref 65–99)
Potassium: 4.1 mmol/L (ref 3.5–5.1)
Sodium: 138 mmol/L (ref 135–145)

## 2017-12-12 LAB — I-STAT TROPONIN, ED
TROPONIN I, POC: 0.03 ng/mL (ref 0.00–0.08)
TROPONIN I, POC: 0.03 ng/mL (ref 0.00–0.08)

## 2017-12-12 LAB — CBC
HEMATOCRIT: 35 % — AB (ref 39.0–52.0)
HEMOGLOBIN: 11.4 g/dL — AB (ref 13.0–17.0)
MCH: 31.8 pg (ref 26.0–34.0)
MCHC: 32.6 g/dL (ref 30.0–36.0)
MCV: 97.8 fL (ref 78.0–100.0)
Platelets: 139 10*3/uL — ABNORMAL LOW (ref 150–400)
RBC: 3.58 MIL/uL — ABNORMAL LOW (ref 4.22–5.81)
RDW: 13.3 % (ref 11.5–15.5)
WBC: 6.3 10*3/uL (ref 4.0–10.5)

## 2017-12-12 LAB — TROPONIN I: Troponin I: 0.03 ng/mL (ref <0.03)

## 2017-12-12 LAB — BRAIN NATRIURETIC PEPTIDE: B Natriuretic Peptide: 384 pg/mL — ABNORMAL HIGH (ref 0.0–100.0)

## 2017-12-12 LAB — D-DIMER, QUANTITATIVE: D-Dimer, Quant: 0.59 ug/mL-FEU — ABNORMAL HIGH (ref 0.00–0.50)

## 2017-12-12 LAB — INFLUENZA PANEL BY PCR (TYPE A & B)
Influenza A By PCR: NEGATIVE
Influenza B By PCR: NEGATIVE

## 2017-12-12 LAB — GLUCOSE, CAPILLARY: GLUCOSE-CAPILLARY: 150 mg/dL — AB (ref 65–99)

## 2017-12-12 LAB — STREP PNEUMONIAE URINARY ANTIGEN: Strep Pneumo Urinary Antigen: NEGATIVE

## 2017-12-12 MED ORDER — TECHNETIUM TC 99M DIETHYLENETRIAME-PENTAACETIC ACID
30.0000 | Freq: Once | INTRAVENOUS | Status: AC | PRN
  Administered 2017-12-12: 30 via RESPIRATORY_TRACT

## 2017-12-12 MED ORDER — MORPHINE SULFATE (PF) 4 MG/ML IV SOLN
4.0000 mg | Freq: Once | INTRAVENOUS | Status: AC
  Administered 2017-12-12: 4 mg via INTRAVENOUS
  Filled 2017-12-12: qty 1

## 2017-12-12 MED ORDER — ASPIRIN EC 81 MG PO TBEC
81.0000 mg | DELAYED_RELEASE_TABLET | Freq: Every day | ORAL | Status: DC
  Administered 2017-12-13 – 2017-12-16 (×3): 81 mg via ORAL
  Filled 2017-12-12 (×3): qty 1

## 2017-12-12 MED ORDER — GI COCKTAIL ~~LOC~~: 30.0000 mL | Freq: Four times a day (QID) | ORAL | Status: DC | PRN

## 2017-12-12 MED ORDER — TAMSULOSIN HCL 0.4 MG PO CAPS: 0.4000 mg | ORAL_CAPSULE | Freq: Every day | ORAL | Status: DC

## 2017-12-12 MED ORDER — HYDRALAZINE HCL 50 MG PO TABS
50.0000 mg | ORAL_TABLET | Freq: Three times a day (TID) | ORAL | Status: DC
  Administered 2017-12-12 – 2017-12-13 (×2): 50 mg via ORAL
  Filled 2017-12-12 (×2): qty 1

## 2017-12-12 MED ORDER — LATANOPROST 0.005 % OP SOLN
1.0000 [drp] | Freq: Every day | OPHTHALMIC | Status: DC
  Administered 2017-12-12 – 2017-12-13 (×2): 1 [drp] via OPHTHALMIC
  Filled 2017-12-12: qty 2.5

## 2017-12-12 MED ORDER — POTASSIUM CHLORIDE CRYS ER 20 MEQ PO TBCR
20.0000 meq | EXTENDED_RELEASE_TABLET | Freq: Every day | ORAL | Status: DC
  Administered 2017-12-13: 20 meq via ORAL
  Filled 2017-12-12: qty 1

## 2017-12-12 MED ORDER — AZITHROMYCIN 250 MG PO TABS
500.0000 mg | ORAL_TABLET | Freq: Once | ORAL | Status: AC
  Administered 2017-12-12: 500 mg via ORAL
  Filled 2017-12-12: qty 2

## 2017-12-12 MED ORDER — ISOSORBIDE MONONITRATE ER 60 MG PO TB24
120.0000 mg | ORAL_TABLET | Freq: Every day | ORAL | Status: DC
  Administered 2017-12-13: 120 mg via ORAL
  Filled 2017-12-12: qty 2

## 2017-12-12 MED ORDER — TECHNETIUM TO 99M ALBUMIN AGGREGATED
4.0000 | Freq: Once | INTRAVENOUS | Status: AC | PRN
  Administered 2017-12-12: 4 via INTRAVENOUS

## 2017-12-12 MED ORDER — DEXTROSE 5 % IV SOLN
1.0000 g | INTRAVENOUS | Status: DC
  Administered 2017-12-13 – 2017-12-18 (×6): 1 g via INTRAVENOUS
  Filled 2017-12-12 (×6): qty 10

## 2017-12-12 MED ORDER — EZETIMIBE 10 MG PO TABS
10.0000 mg | ORAL_TABLET | Freq: Every day | ORAL | Status: DC
  Administered 2017-12-12: 10 mg via ORAL
  Filled 2017-12-12: qty 1

## 2017-12-12 MED ORDER — DEXTROSE 5 % IV SOLN
1.0000 g | Freq: Once | INTRAVENOUS | Status: AC
  Administered 2017-12-12: 1 g via INTRAVENOUS
  Filled 2017-12-12: qty 10

## 2017-12-12 MED ORDER — ACETAMINOPHEN 325 MG PO TABS: 650.0000 mg | ORAL_TABLET | ORAL | Status: DC | PRN

## 2017-12-12 MED ORDER — FUROSEMIDE 10 MG/ML IJ SOLN
80.0000 mg | Freq: Two times a day (BID) | INTRAMUSCULAR | Status: DC
  Administered 2017-12-12 – 2017-12-13 (×2): 80 mg via INTRAVENOUS
  Filled 2017-12-12 (×2): qty 8

## 2017-12-12 MED ORDER — PANTOPRAZOLE SODIUM 40 MG PO TBEC
40.0000 mg | DELAYED_RELEASE_TABLET | Freq: Every day | ORAL | Status: DC
Start: 2017-12-12 — End: 2017-12-14
  Administered 2017-12-12 – 2017-12-13 (×2): 40 mg via ORAL
  Filled 2017-12-12 (×2): qty 1

## 2017-12-12 MED ORDER — AZITHROMYCIN 500 MG PO TABS
500.0000 mg | ORAL_TABLET | ORAL | Status: DC
  Administered 2017-12-13: 500 mg via ORAL
  Filled 2017-12-12: qty 1

## 2017-12-12 MED ORDER — MORPHINE SULFATE (PF) 2 MG/ML IV SOLN
2.0000 mg | INTRAVENOUS | Status: DC | PRN
  Administered 2017-12-12 – 2017-12-16 (×2): 2 mg via INTRAVENOUS
  Filled 2017-12-12 (×2): qty 1

## 2017-12-12 MED ORDER — INSULIN ASPART 100 UNIT/ML ~~LOC~~ SOLN
0.0000 [IU] | Freq: Three times a day (TID) | SUBCUTANEOUS | Status: DC
Start: 2017-12-13 — End: 2017-12-19
  Administered 2017-12-13: 2 [IU] via SUBCUTANEOUS
  Administered 2017-12-15 – 2017-12-16 (×3): 1 [IU] via SUBCUTANEOUS
  Administered 2017-12-17: 2 [IU] via SUBCUTANEOUS
  Administered 2017-12-17: 3 [IU] via SUBCUTANEOUS
  Administered 2017-12-17 – 2017-12-18 (×2): 2 [IU] via SUBCUTANEOUS
  Administered 2017-12-18: 3 [IU] via SUBCUTANEOUS
  Administered 2017-12-19: 1 [IU] via SUBCUTANEOUS
  Administered 2017-12-19: 2 [IU] via SUBCUTANEOUS

## 2017-12-12 MED ORDER — ADULT MULTIVITAMIN W/MINERALS CH
1.0000 | ORAL_TABLET | Freq: Every day | ORAL | Status: DC
  Administered 2017-12-12 – 2017-12-13 (×2): 1 via ORAL
  Filled 2017-12-12 (×2): qty 1

## 2017-12-12 MED ORDER — ONDANSETRON HCL 4 MG/2ML IJ SOLN
4.0000 mg | Freq: Four times a day (QID) | INTRAMUSCULAR | Status: DC | PRN
  Administered 2017-12-13: 4 mg via INTRAVENOUS
  Filled 2017-12-12: qty 2

## 2017-12-12 MED ORDER — ONDANSETRON HCL 4 MG/2ML IJ SOLN
4.0000 mg | Freq: Once | INTRAMUSCULAR | Status: AC
  Administered 2017-12-12: 4 mg via INTRAVENOUS
  Filled 2017-12-12: qty 2

## 2017-12-12 MED ORDER — INSULIN ASPART 100 UNIT/ML ~~LOC~~ SOLN: 0.0000 [IU] | Freq: Every day | SUBCUTANEOUS | Status: DC

## 2017-12-12 MED ORDER — HEPARIN SODIUM (PORCINE) 5000 UNIT/ML IJ SOLN
5000.0000 [IU] | Freq: Three times a day (TID) | INTRAMUSCULAR | Status: DC
  Administered 2017-12-12 – 2017-12-19 (×19): 5000 [IU] via SUBCUTANEOUS
  Filled 2017-12-12 (×19): qty 1

## 2017-12-12 MED ORDER — NITROGLYCERIN 0.4 MG SL SUBL: 0.4000 mg | SUBLINGUAL_TABLET | SUBLINGUAL | Status: DC | PRN

## 2017-12-12 MED ORDER — CARVEDILOL 12.5 MG PO TABS
12.5000 mg | ORAL_TABLET | Freq: Two times a day (BID) | ORAL | Status: DC
  Administered 2017-12-12 – 2017-12-13 (×2): 12.5 mg via ORAL
  Filled 2017-12-12 (×2): qty 1

## 2017-12-12 NOTE — Telephone Encounter (Signed)
Patients wife called to cancel appointment she said patient was having chest pain and did not feel up to coming in for appointment. She gave patient nitroglycerin 1 tablet every 5 min for 15 min and would continue to monitor him. She said if he didn't feel better or problems worsened she would call EMS to have patient taken to the emergency room.

## 2017-12-12 NOTE — Progress Notes (Signed)
CRITICAL VALUE ALERT  Critical Value:  Troponin 0.03  Date & Time Notied:  12/12/16  Provider Notified: Lamar Blinks  Orders Received/Actions taken:

## 2017-12-12 NOTE — ED Triage Notes (Signed)
Pt arrived via [facility] from home c/o left sided chest pain starting at 9am with some SOB.  Pt has taken 6 SL Nitro prior to arrival and 324mg  ASA.  Significant cardiac history.

## 2017-12-12 NOTE — H&P (Signed)
History and Physical    [name] [name] [name]:[id number] DOB: 1926-01-16 DOA: 12/12/2017  PCP: [name] Curry, DO   Patient coming from: Home I have personally briefly reviewed patient's old medical records in [place]  Chief Complaint: Chest pain  HPI: [name] [name] [name] is a 84 y.o. male with medical history significant of coronary artery disease, CABG, bioprosthetic AVR, anemia, chronic kidney disease stage IV, chronic systolic heart failure and colon cancer presented today with complaints of sudden onset of chest pain.  Patient states that he started having chest pain early this morning after he woke up, mostly left-sided, sharp in nature, intermittent, up to 7 out of 10 intensity with radiation to the whole chest and his abdomen.  He also complained of some shortness of breath.  Chest pain was noted with nitroglycerin.  Patient also complains of intermittent cough but no productive sputum, fever, nausea, vomiting, diarrhea, chest trauma.  Patient denied any recent weight gain or decreased urine output.  ED Course: Patient was given intravenous Lasix and antibiotics for probable pneumonia on chest x-ray.  Cardiology/heart failure team was consulted.  Initial troponin was negative.  Patient continued to have chest pain.  VQ scan was ordered which is pending.  Hospitalist service was called to evaluate the patient.  Review of Systems: As per HPI otherwise 10 point review of systems negative.    Past Medical History:  Diagnosis Date  . Anemia   . Anxiety   . Aortic stenosis    a. s/p AVR with 45mm Edwards pericardial valve 02/07/12 - post-op course complicated by pleural effusion requring thoracentesis/leg cellulitis 03/2012.  . Baker's cyst 07/23/12   Incidental finding of LE venous dopplers  . Blood transfusion    NO REACTION TO TRANSFUSION  . CAD (coronary artery disease)    a. s/p NSTEMI 12/2011:  LHC - Ostial left main 20%, ostial LAD 50%, mid 60-70%, ostial D1 40% and mid 40%, D2  70%, ostial circumflex occluded, ostial RCA 80-90%, LVEDP was 42. b.  s/p CABG x 3 at time of AVR (LiMA-LAD, SVG-2nd daigonal, SVG-PDA) 02/07/12 (post-op course noted above).  . Cancer [facility]) '90's   Colon  . Cataract    right eye, hx of  . Cellulitis    a. RLE cellulitis 2 months post-operatively after AVR/CABG - serratia marcessans, tx with I&D/antibiotics  . CHF (congestive heart failure) ([place])   . Chronic kidney disease   . Chronic systolic heart failure ([place])    a. TEE 01/2012: EF 25-30%, diffuse hypokinesis. b. Not on ACEI due to renal insufficiency.;  c. follow up  echo 08/06/12: EF 25%, mod diast dysfxn, AVR ok, mild MR, mod LAE, mild RAE, mild to mod RV systolic dysfunction  . Diabetes mellitus    borderline  . Flash pulmonary edema ([place])    Post-cath 12/2011, went into acute pulm edema requiring IV lasix and intubation  . GERD (gastroesophageal reflux disease)   . History of colon cancer    s/p colon resection  . HLD (hyperlipidemia)   . HTN (hypertension)    primary, Dr. Hollace Kinnier  . Ischemic cardiomyopathy   . Mitral regurgitation    Moderate by TEE 01/2012  . Myocardial infarction ([place]) 12/2011  . Pleural effusion    a. post-operatively after AVR/CABG s/p thoracentesis 03/2012 yielding 1L serosanguinous fluid.  . Stroke ([place]) 10/01   RIght Leg weakness    Past Surgical History:  Procedure Laterality Date  . AORTIC VALVE REPLACEMENT  02/07/2012   Procedure:  AORTIC VALVE REPLACEMENT (AVR);  Surgeon: Gaye Pollack, MD;  Location: [place];  Service: Open Heart Surgery;  Laterality: N/A;  . CARDIAC CATHETERIZATION     1.3.13  stopped breathing, put on ventilator for 5-6 days  . CATARACT EXTRACTION     rt  . CHEST TUBE INSERTION  07/24/2012   Procedure: INSERTION PLEURAL DRAINAGE CATHETER;  Surgeon: Gaye Pollack, MD;  Location: [place];  Service: Thoracic;  Laterality: Left;  . COLON RESECTION  1996  . COLONOSCOPY    . CORONARY ARTERY BYPASS GRAFT  02/07/2012   Procedure:  CORONARY ARTERY BYPASS GRAFTING (CABG);  Surgeon: Gaye Pollack, MD;  Location: [place];  Service: Open Heart Surgery;  Laterality: N/A;  CABG x three; using right leg greater saphenous vein harvested endoscopically  . ERCP N/A 07/08/2014   Procedure: ENDOSCOPIC RETROGRADE CHOLANGIOPANCREATOGRAPHY (ERCP);  Surgeon: Missy Sabins, MD;  Location: [facility] ENDOSCOPY;  Service: Endoscopy;  Laterality: N/A;  . ESOPHAGEAL DILATION    . ESOPHAGOGASTRODUODENOSCOPY N/A 07/03/2014   Procedure: ESOPHAGOGASTRODUODENOSCOPY (EGD);  Surgeon: Arta Silence, MD;  Location: [facility] ENDOSCOPY;  Service: Endoscopy;  Laterality: N/A;  . ESOPHAGOSCOPY WITH DILITATION N/A 07/07/2014   Procedure: ESOPHAGOSCOPY WITH DILITATION/SAVARY DILATOR;  Surgeon: Rozetta Nunnery, MD;  Location[facility];  Service: ENT;  Laterality: N/A;  . EYE SURGERY  2009   cataract removed from right eye  . INGUINAL HERNIA REPAIR Right 09/13/2015   Procedure: OPEN RIGHT INGUINAL HERNIA;  Surgeon: Mickeal Skinner, MD;  Location: [place];  Service: General;  Laterality: Right;  . INSERTION OF MESH Right 09/13/2015   Procedure: INSERTION OF MESH;  Surgeon: Mickeal Skinner, MD;  Location: [place];  Service: General;  Laterality: Right;  . LEFT HEART CATHETERIZATION WITH CORONARY ANGIOGRAM N/A 12/13/2011   Procedure: LEFT HEART CATHETERIZATION WITH CORONARY ANGIOGRAM;  Surgeon: Peter M Martinique, MD;  Location: [facility] CATH LAB;  Service: Cardiovascular;  Laterality: N/A;  . REMOVAL OF PLEURAL DRAINAGE CATHETER  09/27/2012   Procedure: REMOVAL OF PLEURAL DRAINAGE CATHETER;  Surgeon: Gaye Pollack, MD;  Location: [place];  Service: Thoracic;  Laterality: Left;  . TALC PLEURODESIS  08/30/2012   Procedure: Pietro Cassis;  Surgeon: Gaye Pollack, MD;  Location: [place];  Service: Thoracic;  Laterality: Left;  INSERTION OF TALC VIA LEFT PLEURX  . TALC PLEURODESIS  09/12/2012   Procedure: Pietro Cassis;  Surgeon: Gaye Pollack, MD;  Location: [place];  Service: Thoracic;   Laterality: Left;  . TONSILLECTOMY     Social history  reports that  has never smoked. he has never used smokeless tobacco. He reports that he does not drink alcohol or use drugs.  Lives at home with family  Allergies  Allergen Reactions  . Statins Other (See Comments)    Pain/weakness in legs  . Ambien [Zolpidem Tartrate] Anxiety and Other (See Comments)    Anxiety and hallucinations     Family History  Problem Relation Age of Onset  . Cancer Mother   . Heart attack Father   . Mental illness Brother   . Kidney failure Brother     Prior to Admission medications   Medication Sig Start Date End Date Taking? Authorizing Provider  acetaminophen (TYLENOL) 325 MG tablet Take 2 tablets (650 mg total) by mouth every 6 (six) hours as needed for mild pain or headache. 07/08/17  Yes Kilroy, Doreene Burke, PA-C  aspirin EC 81 MG EC tablet Take 1 tablet (81 mg total) by  mouth daily. 06/27/17  Yes Shirley Friar, PA-C  carvedilol (COREG) 12.5 MG tablet Take 1 tablet (12.5 mg total) by mouth 2 (two) times daily with a meal. 06/26/17  Yes Tillery, Satira Mccallum, PA-C  ezetimibe (ZETIA) 10 MG tablet Take 1 tablet (10 mg total) by mouth at bedtime. 06/26/17  Yes Shirley Friar, PA-C  furosemide (LASIX) 40 MG tablet Take 2 tablets (80 mg total) by mouth 2 (two) times daily. May take additional 80 mg (2 tabs) as needed Patient taking differently: Take 80-160 mg by mouth See admin instructions. 80 mg two times a day and may take an additional 80 mg once a day as needed for edema or fluid 10/09/17  Yes Larey Dresser, MD  glimepiride (AMARYL) 1 MG tablet TAKE 1MG  BY MOUTH DAILY WITH BREAKFAST Patient taking differently: Take 1 mg by mouth daily with breakfast.  07/08/17  Yes Kilroy, Luke K, PA-C  hydrALAZINE (APRESOLINE) 50 MG tablet Take 1 tablet (50 mg total) by mouth 3 (three) times daily. 06/26/17  Yes Shirley Friar, PA-C  isosorbide mononitrate (IMDUR) 60 MG 24 hr tablet Take 2  tablets (120 mg total) by mouth daily. Patient taking differently: Take 60 mg by mouth 2 (two) times daily.  07/12/17  Yes Larey Dresser, MD  Multiple Vitamin (MULTIVITAMIN WITH MINERALS) TABS tablet Take 1 tablet by mouth daily.   Yes [provider]  nitroGLYCERIN (NITROSTAT) 0.4 MG SL tablet Place 1 tablet (0.4 mg total) under the tongue every 5 (five) minutes as needed for chest pain. As needed Patient taking differently: Place 0.4 mg under the tongue every 5 (five) minutes as needed for chest pain.  10/09/17  Yes Larey Dresser, MD  omeprazole (PRILOSEC) 20 MG capsule Take 20 mg by mouth daily.    Yes [provider]  potassium chloride SA (K-DUR,KLOR-CON) 20 MEQ tablet Take 1 tablet (20 mEq total) by mouth daily. 07/09/17  Yes Kilroy, Doreene Burke, PA-C  tamsulosin (FLOMAX) 0.4 MG CAPS capsule Take one capsule by mouth once daily after dinner Patient taking differently: Take 0.4 mg by mouth See admin instructions. 0.4 mg by mouth once a day after dinner 11/28/17  Yes [name], [name] L, DO  Travoprost, BAK Free, (TRAVATAN) 0.004 % SOLN ophthalmic solution Place 1 drop into both eyes at bedtime.    Yes [provider]  glimepiride (AMARYL) 1 MG tablet TAKE 1 TABLET BY MOUTH DAILY WITH BREAKFAST Patient not taking: Reported on 12/12/2017 12/08/17   [name] Curry, DO    Physical Exam: Vitals:   12/12/17 1615 12/12/17 1700 12/12/17 1722 12/12/17 1730  BP: (!) 132/56 (!) 151/64  130/73  Pulse: (!) 59 67  67  Resp: (!) 23 (!) 32  17  Temp:      TempSrc:      SpO2: 97% 96% 96% 97%  Weight:   70.8 kg (156 lb)   Height:   5\' 9"  (1.753 m)     Constitutional: Elderly and frail male lying in bed in mild discomfort secondary to pain Vitals:   12/12/17 1615 12/12/17 1700 12/12/17 1722 12/12/17 1730  BP: (!) 132/56 (!) 151/64  130/73  Pulse: (!) 59 67  67  Resp: (!) 23 (!) 32  17  Temp:      TempSrc:      SpO2: 97% 96% 96% 97%  Weight:   70.8 kg (156 lb)   Height:   5'  9" (1.753 m)    Eyes:  PERRL, lids and conjunctivae normal ENMT: Mucous membranes are moist. Posterior pharynx clear of any exudate or lesions. Neck: normal, supple, no masses, no thyromegaly Respiratory: Bilateral decreased breath sounds at bases with some crackles.  No wheezing.  No accessory muscle use.  Currently not tachypneic.  No chest wall tenderness cardiovascular: S1-S2 positive, rate controlled.  No murmurs.  Trace pedal edema  abdomen: Mild epigastric and periumbilical tenderness, no masses palpated. No hepatosplenomegaly. Bowel sounds positive.  Musculoskeletal: no clubbing / cyanosis. No joint deformity upper and lower extremities. Skin: no rashes, lesions, ulcers. No induration Neurologic: CN 2-12 grossly intact.  Moving extremities.  No focal neurological deficit  psychiatric: Normal judgment and insight. Alert and oriented x 3. Normal mood.    Labs on Admission: I have personally reviewed following labs and imaging studies  CBC: Recent Labs  Lab 12/12/17 1335  WBC 6.3  HGB 11.4*  HCT 35.0*  MCV 97.8  PLT 007*   Basic Metabolic Panel: Recent Labs  Lab 12/12/17 1335  NA 138  K 4.1  CL 100*  CO2 30  GLUCOSE 142*  BUN 68*  CREATININE 2.88*  CALCIUM 9.3   GFR: Estimated Creatinine Clearance: 17.4 mL/min (A) (by C-G formula based on SCr of 2.88 mg/dL (H)). Liver Function Tests: No results for input(s): AST, ALT, ALKPHOS, BILITOT, PROT, ALBUMIN in the last 168 hours. No results for input(s): LIPASE, AMYLASE in the last 168 hours. No results for input(s): AMMONIA in the last 168 hours. Coagulation Profile: No results for input(s): INR, PROTIME in the last 168 hours. Cardiac Enzymes: No results for input(s): CKTOTAL, CKMB, CKMBINDEX, TROPONINI in the last 168 hours. BNP (last 3 results) No results for input(s): PROBNP in the last 8760 hours. HbA1C: No results for input(s): HGBA1C in the last 72 hours. CBG: No results for input(s): GLUCAP in the last 168  hours. Lipid Profile: No results for input(s): CHOL, HDL, LDLCALC, TRIG, CHOLHDL, LDLDIRECT in the last 72 hours. Thyroid Function Tests: No results for input(s): TSH, T4TOTAL, FREET4, T3FREE, THYROIDAB in the last 72 hours. Anemia Panel: No results for input(s): VITAMINB12, FOLATE, FERRITIN, TIBC, IRON, RETICCTPCT in the last 72 hours. Urine analysis:    Component Value Date/Time   COLORURINE YELLOW 10/22/2015 2253   APPEARANCEUR CLEAR 10/22/2015 2253   LABSPEC 1.016 10/22/2015 2253   PHURINE 5.5 10/22/2015 2253   GLUCOSEU NEGATIVE 10/22/2015 2253   HGBUR NEGATIVE 10/22/2015 2253   BILIRUBINUR NEGATIVE 10/22/2015 2253   KETONESUR NEGATIVE 10/22/2015 2253   PROTEINUR NEGATIVE 10/22/2015 2253   UROBILINOGEN 0.2 10/22/2015 2253   NITRITE NEGATIVE 10/22/2015 2253   LEUKOCYTESUR NEGATIVE 10/22/2015 2253    Radiological Exams on Admission: Dg Chest Port 1 View  Result Date: 12/12/2017 CLINICAL DATA:  Shortness of breath and chest pain EXAM: PORTABLE CHEST 1 VIEW COMPARISON:  October 08, 2017 FINDINGS: There is a focal area of ill-defined opacity in the left base, concerning for focal pneumonia. Lungs elsewhere are clear. Heart is enlarged with pulmonary vascularity within normal limits. Patient is status post aortic valve replacement and coronary artery/ internal mammary bypass grafting. No evident adenopathy. No bone lesions. There is aortic atherosclerosis. IMPRESSION: Ill-defined opacity left base concerning for focal pneumonia. Lungs elsewhere clear. Stable cardiomegaly. Postoperative change is noted. There is aortic atherosclerosis. Aortic Atherosclerosis (ICD10-I70.0). Followup PA and lateral chest radiographs recommended in 3-4 weeks following trial of antibiotic therapy to ensure resolution and exclude underlying malignancy. Electronically Signed   By: Lowella Grip III M.D.  On: 12/12/2017 13:51     Assessment/Plan Active Problems:   Chest pain   Chronic combined systolic  and diastolic heart failure, NYHA class 2 (HCC)   Chronic kidney disease (CKD), stage IV (severe) (HCC)   Diabetes mellitus type 2, controlled, with complications ([place])   Community acquired pneumonia   Pneumonia  Chest pain -Probably pleuritic from pneumonia versus ischemic chest pain.  Unrelieved by nitroglycerin -Continue antibiotics.  Cardiology following -CT has been ordered in the ED which is pending -VQ scan is pending -Follow echo  Probable left-sided community-acquired bacterial pneumonia -Continue Rocephin and Zithromax.  Follow blood cultures and sputum cultures.  Urine Legionella and streptococcal antigen. -Oxygen supplementation if needed  Coronary artery disease status post CABG -History of recurrent chest pain with multiple ED visits and admissions -Initial troponin negative.  Cycle troponins.  Cardiology following. -Continue aspirin, Coreg, Imdur, hydralazine.  Chronic systolic CHF with ischemic cardiomyopathy with history of bioprosthetic AVR -EF of 25-30% on 7/18. -Continue Lasix 80 mg IV twice daily as per cardiology recommendations -Strict input and output.  Daily weights.  Continue Coreg, Imdur, hydralazine.  Not on ACE inhibitor because of renal function  CKD stage IV -Monitor renal function while on IV diuretics  Diabetes mellitus type 2 -Hold oral meds.  Sliding scale insulin.  Hemoglobin A1c   DVT prophylaxis: Heparin Code Status: DNR Family Communication: Wife at bedside Disposition Plan: Home in 1-2 days Consults called: Cardiology/heart failure team consulted by ED Admission status: Observation in telemetry  Severity of Illness: The appropriate patient status for this patient is OBSERVATION. Observation status is judged to be reasonable and necessary in order to provide the required intensity of service to ensure the patient's safety. The patient's presenting symptoms, physical exam findings, and initial radiographic and laboratory data in the  context of their medical condition is felt to place them at decreased risk for further clinical deterioration. Furthermore, it is anticipated that the patient will be medically stable for discharge from the hospital within 2 midnights of admission. The following factors support the patient status of observation.   " The patient's presenting symptoms include chest pain. " The physical exam findings include initial tachypnea, chest crackles. " The initial radiographic and laboratory data are Pneumonia.      Aline August MD [facility] Pager [phone]  If 7PM-7AM, please contact night-coverage [email or web address] Password [facility]  12/12/2017, 5:58 PM

## 2017-12-12 NOTE — ED Notes (Signed)
Patient transported to CT 

## 2017-12-12 NOTE — Consult Note (Signed)
Advanced Heart Failure Team Consult Note   Primary Physician: Hollace Kinnier Primary Cardiologist:  Dr. Aundra Dubin   Reason for Consultation: Chest pain  HPI:    [name] [name] is seen today for evaluation of Chest pain at the request of Dr. Jeanell Sparrow (ED).   [name] [name] is a 84 y.o. male with a PMHx of CAD, CABG, bioprosthetic AVR, anemia, CKD stage 4, chronic systolic heart failure. He is not on an ACE or ARB due renal failure.   Last seen in HF clinic 10/09/2017. Given NTG tabs for home with continued chest pain at times. Noted to have episodes lasting up to 8-9 hours.  Usually feels better after lasix.   Presented to [facility] today with sudden onset dyspnea and CP unrelieved by NTG x 6.  Brought in by EMS. Pertinent labs on admission include K 4.1, Creatinine 2.88, Trop 0.03, WBC 6.3, Hgb 11.4.  CXR concerning for focal pneumonia in left base. No edema noted. EKG unremarkable compared to 10/09/17.  Started feeling bad acutely this am with left sided chest pain.  Un-relieved at home with rest and NTG x 6.  Took lasix as prescribed this am.  Weight stable at home.  Denies fevers or chills, but has had family over for the holidays. Pt states this is similar to his previous CP in quality, but not intensity or duration.  Has had no relief throughout the day.  Echo 06/2017 LVEF 25-30%  Review of Systems: [y] = yes, [ ]  = no   General: Weight gain [ ] ; Weight loss [ ] ; Anorexia [ ] ; Fatigue [y]; Fever [ ] ; Chills [ ] ; Weakness [y]  Cardiac: Chest pain/pressure [y]; Resting SOB [y]; Exertional SOB [y]; Orthopnea [ ] ; Pedal Edema [ ] ; Palpitations [ ] ; Syncope [ ] ; Presyncope [ ] ; Paroxysmal nocturnal dyspnea[ ]  Pulmonary: Cough [y]; Wheezing[ ] ; Hemoptysis[ ] ; Sputum [ ] ; Snoring [ ]   GI: Vomiting[ ] ; Dysphagia[ ] ; Melena[ ] ; Hematochezia [ ] ; Heartburn[ ] ; Abdominal pain [ ] ; Constipation [ ] ; Diarrhea [ ] ; BRBPR [ ]   GU: Hematuria[ ] ; Dysuria [ ] ; Nocturia[ ]   Vascular: Pain in legs with walking  [ ] ; Pain in feet with lying flat [ ] ; Non-healing sores [ ] ; Stroke [ ] ; TIA [ ] ; Slurred speech [ ] ;  Neuro: Headaches[ ] ; Vertigo[ ] ; Seizures[ ] ; Paresthesias[ ] ;Blurred vision [ ] ; Diplopia [ ] ; Vision changes [ ]   Ortho/Skin: Arthritis [y]; Joint pain [y]; Muscle pain [ ] ; Joint swelling [ ] ; Back Pain [ ] ; Rash [ ]   Psych: Depression[ ] ; Anxiety[ ]   Heme: Bleeding problems [ ] ; Clotting disorders [ ] ; Anemia [ ]   Endocrine: Diabetes [ ] ; Thyroid dysfunction[ ]   Home Medications Prior to Admission medications   Medication Sig Start Date End Date Taking? Authorizing Provider  acetaminophen (TYLENOL) 325 MG tablet Take 2 tablets (650 mg total) by mouth every 6 (six) hours as needed for mild pain or headache. 07/08/17   Erlene Quan, PA-C  aspirin EC 81 MG EC tablet Take 1 tablet (81 mg total) by mouth daily. 06/27/17   Shirley Friar, PA-C  carvedilol (COREG) 12.5 MG tablet Take 1 tablet (12.5 mg total) by mouth 2 (two) times daily with a meal. 06/26/17   Tillery, Satira Mccallum, PA-C  ezetimibe (ZETIA) 10 MG tablet Take 1 tablet (10 mg total) by mouth at bedtime. 06/26/17   Shirley Friar, PA-C  furosemide (LASIX) 40 MG tablet Take 2 tablets (80 mg total) by  mouth 2 (two) times daily. May take additional 80 mg (2 tabs) as needed 10/09/17   Larey Dresser, MD  glimepiride (AMARYL) 1 MG tablet TAKE 1MG  BY MOUTH DAILY WITH BREAKFAST 07/08/17   Kilroy, Doreene Burke, PA-C  glimepiride (AMARYL) 1 MG tablet TAKE 1 TABLET BY MOUTH DAILY WITH BREAKFAST 12/08/17   [name], [name] L, DO  hydrALAZINE (APRESOLINE) 50 MG tablet Take 1 tablet (50 mg total) by mouth 3 (three) times daily. 06/26/17   Shirley Friar, PA-C  isosorbide mononitrate (IMDUR) 60 MG 24 hr tablet Take 2 tablets (120 mg total) by mouth daily. 07/12/17   Larey Dresser, MD  Multiple Vitamin (MULTIVITAMIN WITH MINERALS) TABS tablet Take 1 tablet by mouth daily.    [provider]  nitroGLYCERIN (NITROSTAT) 0.4  MG SL tablet Place 1 tablet (0.4 mg total) under the tongue every 5 (five) minutes as needed for chest pain. As needed 10/09/17   Larey Dresser, MD  omeprazole (PRILOSEC) 20 MG capsule Take 20 mg by mouth daily.     [provider]  potassium chloride SA (K-DUR,KLOR-CON) 20 MEQ tablet Take 1 tablet (20 mEq total) by mouth daily. 07/09/17   Erlene Quan, PA-C  tamsulosin (FLOMAX) 0.4 MG CAPS capsule Take one capsule by mouth once daily after dinner 11/28/17   [name], [name] L, DO  Travoprost, BAK Free, (TRAVATAN) 0.004 % SOLN ophthalmic solution Place 1 drop into both eyes at bedtime.     [provider]   Past Medical History: Past Medical History:  Diagnosis Date  . Anemia   . Anxiety   . Aortic stenosis    a. s/p AVR with 55mm Edwards pericardial valve 02/07/12 - post-op course complicated by pleural effusion requring thoracentesis/leg cellulitis 03/2012.  . Baker's cyst 07/23/12   Incidental finding of LE venous dopplers  . Blood transfusion    NO REACTION TO TRANSFUSION  . CAD (coronary artery disease)    a. s/p NSTEMI 12/2011:  LHC - Ostial left main 20%, ostial LAD 50%, mid 60-70%, ostial D1 40% and mid 40%, D2 70%, ostial circumflex occluded, ostial RCA 80-90%, LVEDP was 42. b.  s/p CABG x 3 at time of AVR (LiMA-LAD, SVG-2nd daigonal, SVG-PDA) 02/07/12 (post-op course noted above).  . Cancer [facility]) '90's   Colon  . Cataract    right eye, hx of  . Cellulitis    a. RLE cellulitis 2 months post-operatively after AVR/CABG - serratia marcessans, tx with I&D/antibiotics  . CHF (congestive heart failure) ([place])   . Chronic kidney disease   . Chronic systolic heart failure ([place])    a. TEE 01/2012: EF 25-30%, diffuse hypokinesis. b. Not on ACEI due to renal insufficiency.;  c. follow up  echo 08/06/12: EF 25%, mod diast dysfxn, AVR ok, mild MR, mod LAE, mild RAE, mild to mod RV systolic dysfunction  . Diabetes mellitus    borderline  . Flash pulmonary edema ([place])     Post-cath 12/2011, went into acute pulm edema requiring IV lasix and intubation  . GERD (gastroesophageal reflux disease)   . History of colon cancer    s/p colon resection  . HLD (hyperlipidemia)   . HTN (hypertension)    primary, Dr. Hollace Kinnier  . Ischemic cardiomyopathy   . Mitral regurgitation    Moderate by TEE 01/2012  . Myocardial infarction ([place]) 12/2011  . Pleural effusion    a. post-operatively after AVR/CABG s/p thoracentesis 03/2012 yielding 1L serosanguinous fluid.  . Stroke (  [place]) 10/01   RIght Leg weakness   Past Surgical History: Past Surgical History:  Procedure Laterality Date  . AORTIC VALVE REPLACEMENT  02/07/2012   Procedure: AORTIC VALVE REPLACEMENT (AVR);  Surgeon: Gaye Pollack, MD;  Location: [place];  Service: Open Heart Surgery;  Laterality: N/A;  . CARDIAC CATHETERIZATION     1.3.13  stopped breathing, put on ventilator for 5-6 days  . CATARACT EXTRACTION     rt  . CHEST TUBE INSERTION  07/24/2012   Procedure: INSERTION PLEURAL DRAINAGE CATHETER;  Surgeon: Gaye Pollack, MD;  Location: [place];  Service: Thoracic;  Laterality: Left;  . COLON RESECTION  1996  . COLONOSCOPY    . CORONARY ARTERY BYPASS GRAFT  02/07/2012   Procedure: CORONARY ARTERY BYPASS GRAFTING (CABG);  Surgeon: Gaye Pollack, MD;  Location: [place];  Service: Open Heart Surgery;  Laterality: N/A;  CABG x three; using right leg greater saphenous vein harvested endoscopically  . ERCP N/A 07/08/2014   Procedure: ENDOSCOPIC RETROGRADE CHOLANGIOPANCREATOGRAPHY (ERCP);  Surgeon: Missy Sabins, MD;  Location: [facility] ENDOSCOPY;  Service: Endoscopy;  Laterality: N/A;  . ESOPHAGEAL DILATION    . ESOPHAGOGASTRODUODENOSCOPY N/A 07/03/2014   Procedure: ESOPHAGOGASTRODUODENOSCOPY (EGD);  Surgeon: Arta Silence, MD;  Location: [facility] ENDOSCOPY;  Service: Endoscopy;  Laterality: N/A;  . ESOPHAGOSCOPY WITH DILITATION N/A 07/07/2014   Procedure: ESOPHAGOSCOPY WITH DILITATION/SAVARY DILATOR;  Surgeon: Rozetta Nunnery,  MD;  Location: [facility] OR;  Service: ENT;  Laterality: N/A;  . EYE SURGERY  2009   cataract removed from right eye  . INGUINAL HERNIA REPAIR Right 09/13/2015   Procedure: OPEN RIGHT INGUINAL HERNIA;  Surgeon: Mickeal Skinner, MD;  Location: [place];  Service: General;  Laterality: Right;  . INSERTION OF MESH Right 09/13/2015   Procedure: INSERTION OF MESH;  Surgeon: Mickeal Skinner, MD;  Location: [place];  Service: General;  Laterality: Right;  . LEFT HEART CATHETERIZATION WITH CORONARY ANGIOGRAM N/A 12/13/2011   Procedure: LEFT HEART CATHETERIZATION WITH CORONARY ANGIOGRAM;  Surgeon: Peter M Martinique, MD;  Location: [facility] CATH LAB;  Service: Cardiovascular;  Laterality: N/A;  . REMOVAL OF PLEURAL DRAINAGE CATHETER  09/27/2012   Procedure: REMOVAL OF PLEURAL DRAINAGE CATHETER;  Surgeon: Gaye Pollack, MD;  Location: [place];  Service: Thoracic;  Laterality: Left;  . TALC PLEURODESIS  08/30/2012   Procedure: Pietro Cassis;  Surgeon: Gaye Pollack, MD;  Location: [place];  Service: Thoracic;  Laterality: Left;  INSERTION OF TALC VIA LEFT PLEURX  . TALC PLEURODESIS  09/12/2012   Procedure: Pietro Cassis;  Surgeon: Gaye Pollack, MD;  Location: [place];  Service: Thoracic;  Laterality: Left;  . TONSILLECTOMY     Family History: Family History  Problem Relation Age of Onset  . Cancer Mother   . Heart attack Father   . Mental illness Brother   . Kidney failure Brother    Social History: Social History   Socioeconomic History  . Marital status: Married    Spouse name: None  . Number of children: None  . Years of education: None  . Highest education level: None  Social Needs  . Financial resource strain: None  . Food insecurity - worry: None  . Food insecurity - inability: None  . Transportation needs - medical: None  . Transportation needs - non-medical: None  Occupational History  . Occupation: retired owned [employer]  Tobacco Use  . Smoking status: Never Smoker  . Smokeless  tobacco: Never Used  Substance and Sexual Activity  . Alcohol use: No  . Drug use: No  . Sexual activity: Not Currently  Other Topics Concern  . None  Social History Narrative   Married Angelita Ingles   Never smoked   Alcohol none   Exercise walking, weights   Living Will, MOST form   Allergies:  Allergies  Allergen Reactions  . Statins Other (See Comments)    Pain/weakness in legs  . Ambien [Zolpidem Tartrate] Anxiety and Other (See Comments)    Anxiety and hallucinations    Objective:    Vital Signs:   Temp:  [97.5 F (36.4 C)] 97.5 F (36.4 C) (01/02 1357) Pulse Rate:  [56-59] 56 (01/02 1415) Resp:  [20-25] 21 (01/02 1415) BP: (131-154)/(57-61) 131/57 (01/02 1415) SpO2:  [97 %-98 %] 97 % (01/02 1415) Weight:  [156 lb (70.8 kg)] 156 lb (70.8 kg) (01/02 1324)   Weight change: Filed Weights   12/12/17 1324  Weight: 156 lb (70.8 kg)   Intake/Output:  No intake or output data in the 24 hours ending 12/12/17 1445   Physical Exam    General:  Elderly. NAD. Very uncomfortable.  HEENT: normal Neck: supple. JVP 8-9 cm. Carotids 2+ bilat; no bruits. No lymphadenopathy or thyromegaly appreciated. Cor: PMI nondisplaced. Regular rate & rhythm. 2/6 SEM RUSB Lungs: Clear.  Abdomen: Soft, nontender, nondistended. No hepatosplenomegaly. No bruits or masses. Good bowel sounds. Extremities: No cyanosis, clubbing, rash, edema Neuro: Alert & orientedx3, cranial nerves grossly intact. moves all 4 extremities w/o difficulty. Affect flat.   Telemetry   NSR, Personally reviewed.   EKG    Sinus rhythm 59 bpm, personally reviewed. Looks relatively unchanged from 10/09/2017  Labs   Basic Metabolic Panel: Recent Labs  Lab 12/12/17 1335  NA 138  K 4.1  CL 100*  CO2 30  GLUCOSE 142*  BUN 68*  CREATININE 2.88*  CALCIUM 9.3   Liver Function Tests: No results for input(s): AST, ALT, ALKPHOS, BILITOT, PROT, ALBUMIN in the last 168 hours. No results for input(s): LIPASE, AMYLASE  in the last 168 hours. No results for input(s): AMMONIA in the last 168 hours.  CBC: Recent Labs  Lab 12/12/17 1335  WBC 6.3  HGB 11.4*  HCT 35.0*  MCV 97.8  PLT 139*   Cardiac Enzymes: No results for input(s): CKTOTAL, CKMB, CKMBINDEX, TROPONINI in the last 168 hours.  BNP: BNP (last 3 results) Recent Labs    06/25/17 1114 07/05/17 1517 07/11/17 1734  BNP 491.0* 529.6* 281.4*   ProBNP (last 3 results) No results for input(s): PROBNP in the last 8760 hours.  CBG: No results for input(s): GLUCAP in the last 168 hours.  Coagulation Studies: No results for input(s): LABPROT, INR in the last 72 hours.  Imaging   Dg Chest Port 1 View  Result Date: 12/12/2017 CLINICAL DATA:  Shortness of breath and chest pain EXAM: PORTABLE CHEST 1 VIEW COMPARISON:  October 08, 2017 FINDINGS: There is a focal area of ill-defined opacity in the left base, concerning for focal pneumonia. Lungs elsewhere are clear. Heart is enlarged with pulmonary vascularity within normal limits. Patient is status post aortic valve replacement and coronary artery/ internal mammary bypass grafting. No evident adenopathy. No bone lesions. There is aortic atherosclerosis. IMPRESSION: Ill-defined opacity left base concerning for focal pneumonia. Lungs elsewhere clear. Stable cardiomegaly. Postoperative change is noted. There is aortic atherosclerosis. Aortic Atherosclerosis (ICD10-I70.0). Followup PA and lateral chest radiographs recommended in 3-4 weeks following trial of antibiotic therapy to ensure resolution  and exclude underlying malignancy. Electronically Signed   By: Lowella Grip III M.D.   On: 12/12/2017 13:51     Medications:    Current Medications:    Infusions:    Patient Profile   Mr Coury is an 84 y.o. male with a PMHx of CAD, CABG, bioprosthetic AVR, anemia, CKD stage 4, chronic systolic heart failure. He is not on an ACE or ARB due renal failure.   Brought in via EMS with recurrent  CP, not relieved by NTG.   Assessment/Plan   1. Chest pain => Pleuritic - Atypical. Affecting his left chest and ? Upper abdomen.  Not worse with palpation but worse with respirations. Breath "catches" when trying to speak.  - Will check D-dimer and VQ scan with pleuritic CP and acute SOB.  - CXR shows LLL PNA. No s/s at this time, but could be contributing to pleuritic CP.  2. CAD: S/p CABG - Has multiple ED visits and admissions with CP. Troponin negative and usually improves with lasix.   - As previously discussed, with absence of definite MI, would avoid cardiac cath with CKD stage 4 and continue to treat symptomatically (therefore, will not do stress test).  - Continue ASA 81 and Zetia 10 mg daily.   - No statin with myalgias. - Continue coreg 12.5 mg BID - Continue imdur 120 mg daily.  - Continue Sublingual NTG.  - Initial troponin negative. Will trend.  3. Aortic valve replacement: Bioprosthetic. - Valve looked stable on 7/18 echo. No change.  4. Chronic systolic CHF: Ischemic cardiomyopathy.   - EF 25-30% on 7/18 echo, down from EF 40-45%. - Volume status at least mildly overloaded on exam. - Will give lasix 80 mg IV BID while in house.  - Continue coreg 12.5 mg BID - Continue hydralazine 50 mg TID - Continue Imdur 120 mg daily.  - Not on ACEI because of CKD.   5. CKD stage IV:  - Makes volume management more difficult, and procludes from cath as would be very boor dialysis patient.  - Follow closely as above.   Medication concerns reviewed with patient and pharmacy team. Barriers identified: None at this time.   Length of Stay: 0  Annamaria Helling  12/12/2017, 2:45 PM  Advanced Heart Failure Team Pager [phone] (M-F; 7a - 4p)  Please contact [facility] for night-coverage after hours (4p -7a ) and weekends on [email or web address]  Patient seen with PA, agree with the above note.   Patient was admitted with sudden onset pleuritic chest pain and dyspnea.  He has  had similar episodes multiple times in the past, usually resolving with NTG and an extra Lasix.  Today, the pain did not resolve with multiple NTGs so came to ER.  Pain improved with morphine.  His description is clearly pleuritic.  CXR is concerning for possible LLL PNA but no fever or leukocytosis. Troponin negative x 3. ECG with no significant change compared to prior, NSR with LVH/repolarization abnormalities.   On exam, JVP 8 cm, no peripheral edema, lungs clear.   1. Chest pain: He has had multiple similar episodes but this one is lasting longer.  Etiology not clear => no definite volume overload by exam, pain is pleuritic and cardiac enzymes negative/no ECG changes so doubt ACS.  D dimer slightly elevated.  - Would get V/Q scan to rule out PE though unlikely.  - ?Flash pulmonary edema despite lack of corroborating evidence on exam.  Would give Lasix 80 mg  IV x 1 now.  - Can cycle cardiac enzymes but very high threshold for cath given CKD stage IV.   - Given pleuritic CP, ?PNA.  2. ID: Possible LLL PNA as cause of pleuritic left-sided chest pain.  No fever or leukocytosis.  Send blood cultures, would treat with course of antibiotics.   Loralie Champagne 12/12/2017 6:14 PM

## 2017-12-12 NOTE — ED Provider Notes (Signed)
[facility] EMERGENCY DEPARTMENT Provider Note   CSN: [id number] Arrival date & time: 12/12/17  1310     History   Chief Complaint Chief Complaint  Patient presents with  . Chest Pain    HPI [name] [name] is a 84 y.o. male.  Patient with of CAD s/p CABG and bioprosthetic AVR (2013), anemia, CKD stage 4, chronic systolic heart failure on 80mg  lasix bid --presents with acute onset of chest pain and shortness of breath starting after he got up out of bed at around 9 AM.  Patient states that he attempted to eat breakfast but could not do so because he felt like he was going to choke if he tried to eat.  Pain was in the left chest.  Patient has received nitroglycerin x6 and aspirin prior to arrival.  States that yesterday he felt fairly well.  No fevers or cough.  Wife states compliance with medications and weights at home have been stable.  No pain in his abdomen or back.  No worsening LE edema. The onset of this condition was acute. The course is constant. Aggravating factors: none. Alleviating factors: none.        Past Medical History:  Diagnosis Date  . Anemia   . Anxiety   . Aortic stenosis    a. s/p AVR with 74mm Edwards pericardial valve 02/07/12 - post-op course complicated by pleural effusion requring thoracentesis/leg cellulitis 03/2012.  . Baker's cyst 07/23/12   Incidental finding of LE venous dopplers  . Blood transfusion    NO REACTION TO TRANSFUSION  . CAD (coronary artery disease)    a. s/p NSTEMI 12/2011:  LHC - Ostial left main 20%, ostial LAD 50%, mid 60-70%, ostial D1 40% and mid 40%, D2 70%, ostial circumflex occluded, ostial RCA 80-90%, LVEDP was 42. b.  s/p CABG x 3 at time of AVR (LiMA-LAD, SVG-2nd daigonal, SVG-PDA) 02/07/12 (post-op course noted above).  . Cancer [facility]) '90's   Colon  . Cataract    right eye, hx of  . Cellulitis    a. RLE cellulitis 2 months post-operatively after AVR/CABG - serratia marcessans, tx with I&D/antibiotics    . CHF (congestive heart failure) ([place])   . Chronic kidney disease   . Chronic systolic heart failure ([place])    a. TEE 01/2012: EF 25-30%, diffuse hypokinesis. b. Not on ACEI due to renal insufficiency.;  c. follow up  echo 08/06/12: EF 25%, mod diast dysfxn, AVR ok, mild MR, mod LAE, mild RAE, mild to mod RV systolic dysfunction  . Diabetes mellitus    borderline  . Flash pulmonary edema ([place])    Post-cath 12/2011, went into acute pulm edema requiring IV lasix and intubation  . GERD (gastroesophageal reflux disease)   . History of colon cancer    s/p colon resection  . HLD (hyperlipidemia)   . HTN (hypertension)    primary, Dr. Hollace Kinnier  . Ischemic cardiomyopathy   . Mitral regurgitation    Moderate by TEE 01/2012  . Myocardial infarction ([place]) 12/2011  . Pleural effusion    a. post-operatively after AVR/CABG s/p thoracentesis 03/2012 yielding 1L serosanguinous fluid.  . Stroke ([place]) 10/01   RIght Leg weakness    Patient Active Problem List   Diagnosis Date Noted  . PAF (paroxysmal atrial fibrillation) ([place]) 10/03/2017  . Chest pain 09/20/2015  . Advance care planning 08/23/2015  . Diabetes mellitus type 2, controlled, with complications ([place]) 65/78/4696  . S/P AVR (aortic valve replacement) 08/18/2013  .  S/P CABG x 3: LIMA-LAD, SVG-D2, SVG-rPDA 09/17/2012  . Atherosclerosis of native coronary artery-- s/p CABG 01/11/2012  . Chronic combined systolic and diastolic heart failure, NYHA class 2 ([place]) 01/11/2012  . Ischemic cardiomyopathy 01/11/2012  . Chronic kidney disease (CKD), stage IV (severe) ([place]) 01/11/2012  . HLD (hyperlipidemia) 01/11/2012  . Acute on chronic renal failure ([place]) 12/13/2011  . Anemia due to chronic kidney disease 12/13/2011  . Hypertensive heart disease without CHF 02/03/2009    Past Surgical History:  Procedure Laterality Date  . AORTIC VALVE REPLACEMENT  02/07/2012   Procedure: AORTIC VALVE REPLACEMENT (AVR);  Surgeon: Gaye Pollack, MD;  Location:  [facility];  Service: Open Heart Surgery;  Laterality: N/A;  . CARDIAC CATHETERIZATION     1.3.13  stopped breathing, put on ventilator for 5-6 days  . CATARACT EXTRACTION     rt  . CHEST TUBE INSERTION  07/24/2012   Procedure: INSERTION PLEURAL DRAINAGE CATHETER;  Surgeon: Gaye Pollack, MD;  Location: [place];  Service: Thoracic;  Laterality: Left;  . COLON RESECTION  1996  . COLONOSCOPY    . CORONARY ARTERY BYPASS GRAFT  02/07/2012   Procedure: CORONARY ARTERY BYPASS GRAFTING (CABG);  Surgeon: Gaye Pollack, MD;  Location: [place];  Service: Open Heart Surgery;  Laterality: N/A;  CABG x three; using right leg greater saphenous vein harvested endoscopically  . ERCP N/A 07/08/2014   Procedure: ENDOSCOPIC RETROGRADE CHOLANGIOPANCREATOGRAPHY (ERCP);  Surgeon: Missy Sabins, MD;  Location[facility] ENDOSCOPY;  Service: Endoscopy;  Laterality: N/A;  . ESOPHAGEAL DILATION    . ESOPHAGOGASTRODUODENOSCOPY N/A 07/03/2014   Procedure: ESOPHAGOGASTRODUODENOSCOPY (EGD);  Surgeon: Arta Silence, MD;  Location: [facility] ENDOSCOPY;  Service: Endoscopy;  Laterality: N/A;  . ESOPHAGOSCOPY WITH DILITATION N/A 07/07/2014   Procedure: ESOPHAGOSCOPY WITH DILITATION/SAVARY DILATOR;  Surgeon: Rozetta Nunnery, MD;  Location: [facility] OR;  Service: ENT;  Laterality: N/A;  . EYE SURGERY  2009   cataract removed from right eye  . INGUINAL HERNIA REPAIR Right 09/13/2015   Procedure: OPEN RIGHT INGUINAL HERNIA;  Surgeon: Mickeal Skinner, MD;  Location: [place];  Service: General;  Laterality: Right;  . INSERTION OF MESH Right 09/13/2015   Procedure: INSERTION OF MESH;  Surgeon: Mickeal Skinner, MD;  Location: [place];  Service: General;  Laterality: Right;  . LEFT HEART CATHETERIZATION WITH CORONARY ANGIOGRAM N/A 12/13/2011   Procedure: LEFT HEART CATHETERIZATION WITH CORONARY ANGIOGRAM;  Surgeon: Peter M Martinique, MD;  Location: [facility] CATH LAB;  Service: Cardiovascular;  Laterality: N/A;  . REMOVAL OF PLEURAL DRAINAGE CATHETER  09/27/2012    Procedure: REMOVAL OF PLEURAL DRAINAGE CATHETER;  Surgeon: Gaye Pollack, MD;  Location: [place];  Service: Thoracic;  Laterality: Left;  . TALC PLEURODESIS  08/30/2012   Procedure: Pietro Cassis;  Surgeon: Gaye Pollack, MD;  Location: [place];  Service: Thoracic;  Laterality: Left;  INSERTION OF TALC VIA LEFT PLEURX  . TALC PLEURODESIS  09/12/2012   Procedure: Pietro Cassis;  Surgeon: Gaye Pollack, MD;  Location: [facility];  Service: Thoracic;  Laterality: Left;  . TONSILLECTOMY         Home Medications    Prior to Admission medications   Medication Sig Start Date End Date Taking? Authorizing Provider  acetaminophen (TYLENOL) 325 MG tablet Take 2 tablets (650 mg total) by mouth every 6 (six) hours as needed for mild pain or headache. 07/08/17   Erlene Quan, PA-C  aspirin EC 81 MG EC tablet Take 1  tablet (81 mg total) by mouth daily. 06/27/17   Shirley Friar, PA-C  carvedilol (COREG) 12.5 MG tablet Take 1 tablet (12.5 mg total) by mouth 2 (two) times daily with a meal. 06/26/17   Tillery, Satira Mccallum, PA-C  ezetimibe (ZETIA) 10 MG tablet Take 1 tablet (10 mg total) by mouth at bedtime. 06/26/17   Shirley Friar, PA-C  furosemide (LASIX) 40 MG tablet Take 2 tablets (80 mg total) by mouth 2 (two) times daily. May take additional 80 mg (2 tabs) as needed 10/09/17   Larey Dresser, MD  glimepiride (AMARYL) 1 MG tablet TAKE 1MG  BY MOUTH DAILY WITH BREAKFAST 07/08/17   Kilroy, Doreene Burke, PA-C  glimepiride (AMARYL) 1 MG tablet TAKE 1 TABLET BY MOUTH DAILY WITH BREAKFAST 12/08/17   [name], [name] L, DO  hydrALAZINE (APRESOLINE) 50 MG tablet Take 1 tablet (50 mg total) by mouth 3 (three) times daily. 06/26/17   Shirley Friar, PA-C  isosorbide mononitrate (IMDUR) 60 MG 24 hr tablet Take 2 tablets (120 mg total) by mouth daily. 07/12/17   Larey Dresser, MD  Multiple Vitamin (MULTIVITAMIN WITH MINERALS) TABS tablet Take 1 tablet by mouth daily.    [provider]    nitroGLYCERIN (NITROSTAT) 0.4 MG SL tablet Place 1 tablet (0.4 mg total) under the tongue every 5 (five) minutes as needed for chest pain. As needed 10/09/17   Larey Dresser, MD  omeprazole (PRILOSEC) 20 MG capsule Take 20 mg by mouth daily.     [provider]  potassium chloride SA (K-DUR,KLOR-CON) 20 MEQ tablet Take 1 tablet (20 mEq total) by mouth daily. 07/09/17   Erlene Quan, PA-C  tamsulosin (FLOMAX) 0.4 MG CAPS capsule Take one capsule by mouth once daily after dinner 11/28/17   [name], [name] L, DO  Travoprost, BAK Free, (TRAVATAN) 0.004 % SOLN ophthalmic solution Place 1 drop into both eyes at bedtime.     [provider]    Family History Family History  Problem Relation Age of Onset  . Cancer Mother   . Heart attack Father   . Mental illness Brother   . Kidney failure Brother     Social History Social History   Tobacco Use  . Smoking status: Never Smoker  . Smokeless tobacco: Never Used  Substance Use Topics  . Alcohol use: No  . Drug use: No     Allergies   Statins and Ambien [zolpidem tartrate]   Review of Systems Review of Systems  Constitutional: Negative for diaphoresis and fever.  Eyes: Negative for redness.  Respiratory: Positive for shortness of breath. Negative for cough.   Cardiovascular: Positive for chest pain. Negative for palpitations and leg swelling.  Gastrointestinal: Negative for abdominal pain, nausea and vomiting.  Genitourinary: Negative for dysuria.  Musculoskeletal: Negative for back pain and neck pain.  Skin: Negative for rash.  Neurological: Negative for syncope and light-headedness.  Psychiatric/Behavioral: The patient is not nervous/anxious.      Physical Exam Updated Vital Signs BP (!) 131/57   Pulse (!) 56   Temp (!) 97.5 F (36.4 C) (Temporal)   Resp (!) 21   Wt 70.8 kg (156 lb)   SpO2 97%   BMI 23.04 kg/m   Physical Exam  Constitutional: He appears well-developed and well-nourished.  HENT:   Head: Normocephalic and atraumatic.  Mouth/Throat: Mucous membranes are normal. Mucous membranes are not dry.  Eyes: Conjunctivae are normal.  Neck: Trachea normal and normal range of motion. Neck  supple. Normal carotid pulses and no JVD present. No muscular tenderness present. Carotid bruit is not present. No tracheal deviation present.  Cardiovascular: Normal rate, regular rhythm, S1 normal, S2 normal and intact distal pulses. Exam reveals no distant heart sounds and no decreased pulses.  Murmur heard.  Systolic murmur is present with a grade of 2/6. Pulmonary/Chest: Effort normal and breath sounds normal. No respiratory distress. He has no wheezes. He has no rhonchi. He has no rales. He exhibits no tenderness.  Abdominal: Soft. Normal aorta and bowel sounds are normal. There is no tenderness. There is no rebound and no guarding.  Musculoskeletal:       Right lower leg: He exhibits edema (1+).       Left lower leg: He exhibits edema (1+).  Neurological: He is alert.  Skin: Skin is warm and dry. He is not diaphoretic. No cyanosis. No pallor.  Psychiatric: He has a normal mood and affect.  Nursing note and vitals reviewed.    ED Treatments / Results  Labs (all labs ordered are listed, but only abnormal results are displayed) Labs Reviewed  CBC - Abnormal; Notable for the following components:      Result Value   RBC 3.58 (*)    Hemoglobin 11.4 (*)    HCT 35.0 (*)    Platelets 139 (*)    All other components within normal limits  BASIC METABOLIC PANEL - Abnormal; Notable for the following components:   Chloride 100 (*)    Glucose, Bld 142 (*)    BUN 68 (*)    Creatinine, Ser 2.88 (*)    GFR calc non Af Amer 18 (*)    GFR calc Af Amer 21 (*)    All other components within normal limits  BRAIN NATRIURETIC PEPTIDE  D-DIMER, QUANTITATIVE (NOT AT [facility])  TROPONIN I  TROPONIN I  I-STAT TROPONIN, ED    EKG  EKG Interpretation  Date/Time:  Wednesday December 12 2017 13:36:50  EST Ventricular Rate:  59 PR Interval:    QRS Duration: 113 QT Interval:  438 QTC Calculation: 434 R Axis:   -48 Text Interpretation:  Sinus rhythm Prolonged PR interval LVH with IVCD, LAD and secondary repol abnrm No significant change since last tracing Confirmed by Pattricia Boss [phone]) on 12/12/2017 2:12:36 PM       Radiology Dg Chest Port 1 View  Result Date: 12/12/2017 CLINICAL DATA:  Shortness of breath and chest pain EXAM: PORTABLE CHEST 1 VIEW COMPARISON:  October 08, 2017 FINDINGS: There is a focal area of ill-defined opacity in the left base, concerning for focal pneumonia. Lungs elsewhere are clear. Heart is enlarged with pulmonary vascularity within normal limits. Patient is status post aortic valve replacement and coronary artery/ internal mammary bypass grafting. No evident adenopathy. No bone lesions. There is aortic atherosclerosis. IMPRESSION: Ill-defined opacity left base concerning for focal pneumonia. Lungs elsewhere clear. Stable cardiomegaly. Postoperative change is noted. There is aortic atherosclerosis. Aortic Atherosclerosis (ICD10-I70.0). Followup PA and lateral chest radiographs recommended in 3-4 weeks following trial of antibiotic therapy to ensure resolution and exclude underlying malignancy. Electronically Signed   By: Lowella Grip III M.D.   On: 12/12/2017 13:51    Procedures Procedures (including critical care time)  Medications Ordered in ED Medications  furosemide (LASIX) injection 80 mg (80 mg Intravenous Given 12/12/17 1540)     Initial Impression / Assessment and Plan / ED Course  I have reviewed the triage vital signs and the nursing notes.  Pertinent labs &  imaging results that were available during my care of the patient were reviewed by me and considered in my medical decision making (see chart for details).     Patient seen and examined. Work-up initiated. Medications ordered.   Vital signs reviewed and are as follows: BP (!) 131/57    Pulse (!) 56   Temp (!) 97.5 F (36.4 C) (Temporal)   Resp (!) 21   Wt 70.8 kg (156 lb)   SpO2 97%   BMI 23.04 kg/m   2:56 PM Spoke with cardiology, who will consult.  Discussed with patient's wife.  They would prefer patient be discharged home today, however this will depend on recommendations from cardiology.  4:12 PM Pt discussed with and seen by Dr. Jeanell Sparrow. Cards has seen and ordered V/Q as well as IV dose of Lasix which has helped symptoms in the past. They do not plan to admit. Will obtain delta troponin.   Plan: V/Q, delta trop, reassess. If patient well and at baseline, treat as outpt for PNA and have follow-up closely. If continued symptoms, consider admission for continued work-up.   Handoff to [place] PA-C at shift change.   4:20 PM Pt stable. Continues to 'feel rough'.    Final Clinical Impressions(s) / ED Diagnoses   Final diagnoses:  Precordial pain   Pt with CP. Does not appear to be a CHF exacerbation. Pt has had similar episodes of CP in the past. EKG non-ischemic and unchanged. Awaiting reccs from cardiology.   ED Discharge Orders    None       Carlisle Cater, Hershal Coria 12/12/17 1622    Pattricia Boss, MD 12/19/17 [phone]

## 2017-12-12 NOTE — ED Provider Notes (Signed)
[name] [name] is a 84 y.o. male, with a history of CAD, CABG, bioprosthetic AVR, anemia, CKD stage IV, heart failure with EF of 25-30%, presenting to the ED with chest pain and shortness of breath.  Patient states he did not get relief today   HPI from Alecia Lemming, PA-C: "Patient with of CAD s/p CABG and bioprosthetic AVR (2013), anemia, CKD stage 4, chronic systolic heart failure on 80mg  lasix bid --presents with acute onset of chest pain and shortness of breath starting after he got up out of bed at around 9 AM.  Patient states that he attempted to eat breakfast but could not do so because he felt like he was going to choke if he tried to eat.  Pain was in the left chest.  Patient has received nitroglycerin x6 and aspirin prior to arrival.  States that yesterday he felt fairly well.  No fevers or cough.  Wife states compliance with medications and weights at home have been stable.  No pain in his abdomen or back.  No worsening LE edema. The onset of this condition was acute. The course is constant. Aggravating factors: none. Alleviating factors: none."    Past Medical History:  Diagnosis Date  . Anemia   . Anxiety   . Aortic stenosis    a. s/p AVR with 18mm Edwards pericardial valve 02/07/12 - post-op course complicated by pleural effusion requring thoracentesis/leg cellulitis 03/2012.  . Baker's cyst 07/23/12   Incidental finding of LE venous dopplers  . Blood transfusion    NO REACTION TO TRANSFUSION  . CAD (coronary artery disease)    a. s/p NSTEMI 12/2011:  LHC - Ostial left main 20%, ostial LAD 50%, mid 60-70%, ostial D1 40% and mid 40%, D2 70%, ostial circumflex occluded, ostial RCA 80-90%, LVEDP was 42. b.  s/p CABG x 3 at time of AVR (LiMA-LAD, SVG-2nd daigonal, SVG-PDA) 02/07/12 (post-op course noted above).  . Cancer [facility]) '90's   Colon  . Cataract    right eye, hx of  . Cellulitis    a. RLE cellulitis 2 months post-operatively after AVR/CABG - serratia marcessans, tx with  I&D/antibiotics  . CHF (congestive heart failure) ([place])   . Chronic kidney disease   . Chronic systolic heart failure ([place])    a. TEE 01/2012: EF 25-30%, diffuse hypokinesis. b. Not on ACEI due to renal insufficiency.;  c. follow up  echo 08/06/12: EF 25%, mod diast dysfxn, AVR ok, mild MR, mod LAE, mild RAE, mild to mod RV systolic dysfunction  . Diabetes mellitus    borderline  . Flash pulmonary edema ([place])    Post-cath 12/2011, went into acute pulm edema requiring IV lasix and intubation  . GERD (gastroesophageal reflux disease)   . History of colon cancer    s/p colon resection  . HLD (hyperlipidemia)   . HTN (hypertension)    primary, Dr. Hollace Kinnier  . Ischemic cardiomyopathy   . Mitral regurgitation    Moderate by TEE 01/2012  . Myocardial infarction ([place]) 12/2011  . Pleural effusion    a. post-operatively after AVR/CABG s/p thoracentesis 03/2012 yielding 1L serosanguinous fluid.  . Stroke ([place]) 10/01   RIght Leg weakness    Physical Exam  BP 139/65   Pulse 60   Temp (!) 97.5 F (36.4 C) (Temporal)   Resp 20   Wt 70.8 kg (156 lb)   SpO2 97%   BMI 23.04 kg/m   Physical Exam  Constitutional: He appears well-developed and well-nourished. No distress.  HENT:  Head: Normocephalic and atraumatic.  Eyes: Conjunctivae are normal.  Neck: Neck supple.  Cardiovascular: Normal rate, regular rhythm, normal heart sounds and intact distal pulses.  Pulmonary/Chest: Breath sounds normal.  Appears uncomfortable with some increased work of breathing.  Abdominal: Soft. There is tenderness in the left upper quadrant and left lower quadrant. There is no guarding.  Musculoskeletal: He exhibits no edema.  Lymphadenopathy:    He has no cervical adenopathy.  Neurological: He is alert.  Skin: Skin is warm and dry. He is not diaphoretic.  Psychiatric: He has a normal mood and affect. His behavior is normal.  Nursing note and vitals reviewed.   ED Course/Procedures   Clinical Course as  of Dec 13 1823  Wed Dec 12, 2017  1713 Patient states he feels no better. He describes pain in the left abdomen and left chest, "quivering and pulsating up and down," also radiates across the upper abdomen, rated 10/10.   [SJ]  49 Spoke with hospitalist. Agrees to admit the patient.  [SJ]  1734 Spoke with Dr. Thornton Papas, radiologist, for advice regarding imaging of the patient's aorta. States that for the assessment of the aorta due to pain that a CT would be higher yield than an [place], even when performed without contrast.  [SJ]    Clinical Course User Index [SJ] Leslie Jester C, PA-C    Procedures   Abnormal Labs Reviewed  CBC - Abnormal; Notable for the following components:      Result Value   RBC 3.58 (*)    Hemoglobin 11.4 (*)    HCT 35.0 (*)    Platelets 139 (*)    All other components within normal limits  BASIC METABOLIC PANEL - Abnormal; Notable for the following components:   Chloride 100 (*)    Glucose, Bld 142 (*)    BUN 68 (*)    Creatinine, Ser 2.88 (*)    GFR calc non Af Amer 18 (*)    GFR calc Af Amer 21 (*)    All other components within normal limits  BRAIN NATRIURETIC PEPTIDE - Abnormal; Notable for the following components:   B Natriuretic Peptide 384.0 (*)    All other components within normal limits  D-DIMER, QUANTITATIVE (NOT AT [facility]) - Abnormal; Notable for the following components:   D-Dimer, Quant 0.59 (*)    All other components within normal limits   Results for orders placed or performed during the hospital encounter of 12/12/17  CBC  Result Value Ref Range   WBC 6.3 4.0 - 10.5 K/uL   RBC 3.58 (L) 4.22 - 5.81 MIL/uL   Hemoglobin 11.4 (L) 13.0 - 17.0 g/dL   HCT 35.0 (L) 39.0 - 52.0 %   MCV 97.8 78.0 - 100.0 fL   MCH 31.8 26.0 - 34.0 pg   MCHC 32.6 30.0 - 36.0 g/dL   RDW 13.3 11.5 - 15.5 %   Platelets 139 (L) 150 - 400 K/uL  Basic metabolic panel  Result Value Ref Range   Sodium 138 135 - 145 mmol/L   Potassium 4.1 3.5 - 5.1 mmol/L   Chloride 100  (L) 101 - 111 mmol/L   CO2 30 22 - 32 mmol/L   Glucose, Bld 142 (H) 65 - 99 mg/dL   BUN 68 (H) 6 - 20 mg/dL   Creatinine, Ser 2.88 (H) 0.61 - 1.24 mg/dL   Calcium 9.3 8.9 - 10.3 mg/dL   GFR calc non Af Amer 18 (L) >60 mL/min   GFR  calc Af Amer 21 (L) >60 mL/min   Anion gap 8 5 - 15  Brain natriuretic peptide  Result Value Ref Range   B Natriuretic Peptide 384.0 (H) 0.0 - 100.0 pg/mL  D-dimer, quantitative (not at [facility])  Result Value Ref Range   D-Dimer, Quant 0.59 (H) 0.00 - 0.50 ug/mL-FEU  I-stat troponin, ED  Result Value Ref Range   Troponin i, poc 0.03 0.00 - 0.08 ng/mL   Comment 3          I-stat troponin, ED  Result Value Ref Range   Troponin i, poc 0.03 0.00 - 0.08 ng/mL   Comment 3           Dg Chest Port 1 View  Result Date: 12/12/2017 CLINICAL DATA:  Shortness of breath and chest pain EXAM: PORTABLE CHEST 1 VIEW COMPARISON:  October 08, 2017 FINDINGS: There is a focal area of ill-defined opacity in the left base, concerning for focal pneumonia. Lungs elsewhere are clear. Heart is enlarged with pulmonary vascularity within normal limits. Patient is status post aortic valve replacement and coronary artery/ internal mammary bypass grafting. No evident adenopathy. No bone lesions. There is aortic atherosclerosis. IMPRESSION: Ill-defined opacity left base concerning for focal pneumonia. Lungs elsewhere clear. Stable cardiomegaly. Postoperative change is noted. There is aortic atherosclerosis. Aortic Atherosclerosis (ICD10-I70.0). Followup PA and lateral chest radiographs recommended in 3-4 weeks following trial of antibiotic therapy to ensure resolution and exclude underlying malignancy. Electronically Signed   By: Lowella Grip III M.D.   On: 12/12/2017 13:51    MDM    Took patient care handoff report from [facility], [place]. Plan: V/Q scan, second troponin 1645, reevaluate after lasix. May need to admit, but prefers to go home. Treat for PNA  Upon my evaluation of this  patient, he appeared quite uncomfortable.  His pattern for pain does seem a little atypical for ACS, however, patient does have extensive cardiac history.  Patient's distribution of pain and abdominal tenderness gives enough suspicion that I think the patient's aorta needs to be evaluated.  Patient will be admitted for further evaluation and management.    Vitals:   12/12/17 1445 12/12/17 1500 12/12/17 1515 12/12/17 1545  BP: (!) 157/59 (!) 152/65 (!) 158/63 139/65  Pulse: (!) 58 (!) 58 (!) 56 60  Resp: (!) 30 (!) 28 (!) 29 20  Temp:      TempSrc:      SpO2: 97% 98% 97% 97%  Weight:       Vitals:   12/12/17 1700 12/12/17 1722 12/12/17 1730 12/12/17 1800  BP: (!) 151/64  130/73 (!) 126/57  Pulse: 67  67 (!) 56  Resp: (!) 32  17 (!) 23  Temp:      TempSrc:      SpO2: 96% 96% 97% 96%  Weight:  70.8 kg (156 lb)    Height:  5\' 9"  (1.753 m)        Lorayne Bender, PA-C 12/12/17 1825    Quintella Reichert, MD 12/13/17 1556

## 2017-12-12 NOTE — ED Provider Notes (Signed)
84 y.o. Man ho cad, presents complaining of chest pain to left side of chest began this am and ongoing.  He has some associated dyspnea.  Patient nitro without relief.  He denies fever or chills or productive cough.   EKG Interpretation  Date/Time:  Wednesday December 12 2017 13:36:50 EST Ventricular Rate:  59 PR Interval:    QRS Duration: 113 QT Interval:  438 QTC Calculation: 434 R Axis:   -48 Text Interpretation:  Sinus rhythm Prolonged PR interval LVH with IVCD, LAD and secondary repol abnrm No significant change since last tracing Confirmed by Pattricia Boss [phone]) on 12/12/2017 2:12:36 PM       PE Elderly frail appearing man nad Lungs cta cv- rrr No edema noted.  Cardiology consult Plan VQ per cardiology Plan delta troponin Discuss with family and patient re preferences and reevaluation after vq   I performed a history and physical examination of Yoshimi Sarr and discussed his management with Alecia Lemming.  I agree with the history, physical, assessment, and plan of care, with the following exceptions: None  I was present for the following procedures: None Time Spent in Critical Care of the patient: None Time spent in discussions with the patient and family: 68  Ifeanyichukwu Wickham Waylan Rocher, MD 12/12/17 [phone]

## 2017-12-13 ENCOUNTER — Observation Stay (HOSPITAL_BASED_OUTPATIENT_CLINIC_OR_DEPARTMENT_OTHER): Payer: Medicare Other

## 2017-12-13 DIAGNOSIS — Z85038 Personal history of other malignant neoplasm of large intestine: Secondary | ICD-10-CM | POA: Diagnosis not present

## 2017-12-13 DIAGNOSIS — E118 Type 2 diabetes mellitus with unspecified complications: Secondary | ICD-10-CM | POA: Diagnosis not present

## 2017-12-13 DIAGNOSIS — K56609 Unspecified intestinal obstruction, unspecified as to partial versus complete obstruction: Secondary | ICD-10-CM | POA: Diagnosis not present

## 2017-12-13 DIAGNOSIS — K219 Gastro-esophageal reflux disease without esophagitis: Secondary | ICD-10-CM | POA: Diagnosis present

## 2017-12-13 DIAGNOSIS — I5042 Chronic combined systolic (congestive) and diastolic (congestive) heart failure: Secondary | ICD-10-CM | POA: Diagnosis not present

## 2017-12-13 DIAGNOSIS — R1312 Dysphagia, oropharyngeal phase: Secondary | ICD-10-CM | POA: Diagnosis present

## 2017-12-13 DIAGNOSIS — N184 Chronic kidney disease, stage 4 (severe): Secondary | ICD-10-CM

## 2017-12-13 DIAGNOSIS — Z66 Do not resuscitate: Secondary | ICD-10-CM | POA: Diagnosis present

## 2017-12-13 DIAGNOSIS — I252 Old myocardial infarction: Secondary | ICD-10-CM | POA: Diagnosis not present

## 2017-12-13 DIAGNOSIS — D631 Anemia in chronic kidney disease: Secondary | ICD-10-CM | POA: Diagnosis present

## 2017-12-13 DIAGNOSIS — E860 Dehydration: Secondary | ICD-10-CM | POA: Diagnosis present

## 2017-12-13 DIAGNOSIS — I69341 Monoplegia of lower limb following cerebral infarction affecting right dominant side: Secondary | ICD-10-CM | POA: Diagnosis not present

## 2017-12-13 DIAGNOSIS — R001 Bradycardia, unspecified: Secondary | ICD-10-CM | POA: Diagnosis not present

## 2017-12-13 DIAGNOSIS — I255 Ischemic cardiomyopathy: Secondary | ICD-10-CM | POA: Diagnosis present

## 2017-12-13 DIAGNOSIS — R1310 Dysphagia, unspecified: Secondary | ICD-10-CM | POA: Diagnosis not present

## 2017-12-13 DIAGNOSIS — E785 Hyperlipidemia, unspecified: Secondary | ICD-10-CM | POA: Diagnosis present

## 2017-12-13 DIAGNOSIS — F419 Anxiety disorder, unspecified: Secondary | ICD-10-CM | POA: Diagnosis present

## 2017-12-13 DIAGNOSIS — E1122 Type 2 diabetes mellitus with diabetic chronic kidney disease: Secondary | ICD-10-CM | POA: Diagnosis present

## 2017-12-13 DIAGNOSIS — J690 Pneumonitis due to inhalation of food and vomit: Secondary | ICD-10-CM | POA: Diagnosis present

## 2017-12-13 DIAGNOSIS — K5900 Constipation, unspecified: Secondary | ICD-10-CM | POA: Diagnosis present

## 2017-12-13 DIAGNOSIS — I130 Hypertensive heart and chronic kidney disease with heart failure and stage 1 through stage 4 chronic kidney disease, or unspecified chronic kidney disease: Secondary | ICD-10-CM | POA: Diagnosis present

## 2017-12-13 DIAGNOSIS — R072 Precordial pain: Secondary | ICD-10-CM

## 2017-12-13 DIAGNOSIS — R1314 Dysphagia, pharyngoesophageal phase: Secondary | ICD-10-CM | POA: Diagnosis not present

## 2017-12-13 DIAGNOSIS — I5030 Unspecified diastolic (congestive) heart failure: Secondary | ICD-10-CM

## 2017-12-13 DIAGNOSIS — I340 Nonrheumatic mitral (valve) insufficiency: Secondary | ICD-10-CM | POA: Diagnosis present

## 2017-12-13 DIAGNOSIS — I2510 Atherosclerotic heart disease of native coronary artery without angina pectoris: Secondary | ICD-10-CM | POA: Diagnosis present

## 2017-12-13 DIAGNOSIS — R0789 Other chest pain: Secondary | ICD-10-CM | POA: Diagnosis not present

## 2017-12-13 DIAGNOSIS — Z953 Presence of xenogenic heart valve: Secondary | ICD-10-CM | POA: Diagnosis not present

## 2017-12-13 DIAGNOSIS — I480 Paroxysmal atrial fibrillation: Secondary | ICD-10-CM | POA: Diagnosis present

## 2017-12-13 DIAGNOSIS — Z951 Presence of aortocoronary bypass graft: Secondary | ICD-10-CM | POA: Diagnosis not present

## 2017-12-13 DIAGNOSIS — J181 Lobar pneumonia, unspecified organism: Secondary | ICD-10-CM | POA: Diagnosis not present

## 2017-12-13 DIAGNOSIS — N179 Acute kidney failure, unspecified: Secondary | ICD-10-CM | POA: Diagnosis present

## 2017-12-13 LAB — ECHOCARDIOGRAM COMPLETE
HEIGHTINCHES: 68 in
WEIGHTICAEL: 2416.24 [oz_av]

## 2017-12-13 LAB — CBC WITH DIFFERENTIAL/PLATELET
BASOS ABS: 0 10*3/uL (ref 0.0–0.1)
BASOS PCT: 0 %
Eosinophils Absolute: 0 10*3/uL (ref 0.0–0.7)
Eosinophils Relative: 0 %
HCT: 40.3 % (ref 39.0–52.0)
HEMOGLOBIN: 13.3 g/dL (ref 13.0–17.0)
LYMPHS PCT: 10 %
Lymphs Abs: 1.6 10*3/uL (ref 0.7–4.0)
MCH: 33.2 pg (ref 26.0–34.0)
MCHC: 33 g/dL (ref 30.0–36.0)
MCV: 100.5 fL — AB (ref 78.0–100.0)
Monocytes Absolute: 0.7 10*3/uL (ref 0.1–1.0)
Monocytes Relative: 4 %
NEUTROS ABS: 13.5 10*3/uL — AB (ref 1.7–7.7)
NEUTROS PCT: 86 %
Platelets: 148 10*3/uL — ABNORMAL LOW (ref 150–400)
RBC: 4.01 MIL/uL — AB (ref 4.22–5.81)
RDW: 13.7 % (ref 11.5–15.5)
WBC: 15.8 10*3/uL — ABNORMAL HIGH (ref 4.0–10.5)

## 2017-12-13 LAB — GLUCOSE, CAPILLARY
GLUCOSE-CAPILLARY: 121 mg/dL — AB (ref 65–99)
GLUCOSE-CAPILLARY: 148 mg/dL — AB (ref 65–99)
Glucose-Capillary: 129 mg/dL — ABNORMAL HIGH (ref 65–99)
Glucose-Capillary: 174 mg/dL — ABNORMAL HIGH (ref 65–99)

## 2017-12-13 LAB — BASIC METABOLIC PANEL
ANION GAP: 14 (ref 5–15)
BUN: 68 mg/dL — ABNORMAL HIGH (ref 6–20)
CALCIUM: 9.4 mg/dL (ref 8.9–10.3)
CO2: 30 mmol/L (ref 22–32)
Chloride: 96 mmol/L — ABNORMAL LOW (ref 101–111)
Creatinine, Ser: 3.26 mg/dL — ABNORMAL HIGH (ref 0.61–1.24)
GFR, EST AFRICAN AMERICAN: 18 mL/min — AB (ref >60)
GFR, EST NON AFRICAN AMERICAN: 15 mL/min — AB (ref >60)
GLUCOSE: 140 mg/dL — AB (ref 65–99)
POTASSIUM: 4.2 mmol/L (ref 3.5–5.1)
Sodium: 140 mmol/L (ref 135–145)

## 2017-12-13 LAB — HEMOGLOBIN A1C
HEMOGLOBIN A1C: 6.1 % — AB (ref 4.8–5.6)
Mean Plasma Glucose: 128.37 mg/dL

## 2017-12-13 LAB — TROPONIN I
Troponin I: 0.03 ng/mL (ref <0.03)
Troponin I: 0.04 ng/mL (ref <0.03)

## 2017-12-13 MED ORDER — PROMETHAZINE HCL 25 MG/ML IJ SOLN
12.5000 mg | Freq: Once | INTRAMUSCULAR | Status: AC
  Administered 2017-12-13: 12.5 mg via INTRAVENOUS
  Filled 2017-12-13: qty 1

## 2017-12-13 MED ORDER — FUROSEMIDE 80 MG PO TABS: 80.0000 mg | ORAL_TABLET | Freq: Two times a day (BID) | ORAL | Status: DC

## 2017-12-13 NOTE — Consult Note (Signed)
[facility] CONSULT Reason for consult: Dysphagia Referring Physician: [facility] hospitalist.  PCP: Dr. Hollace Kinnier.  Primary GI: Dr. Teena Irani.  [name] [name] is an 84 y.o. male.  HPI: He is a gentleman who in 2015 had a CBD stone.  Attempt was made to pass the ERCP scope but was unable to be passed due to the stricture in the proximal esophagus.  Patient apparently had been dilated in the past for proximal esophageal stricture by Dr. Lucia Gaskins.  Dr. Lucia Gaskins dilated the stricture again and the ERCP scope was able to be passed and the ERCP performed and CBD stones were removed.  He states that he has had chronic reflux and has been on PPI therapy since 2015.  His family notes that he has trouble swallowing pills and solids with things temporarily becoming stuck.  This is occurred intermittently however, for the past 3-4 months has become much more frequent.  Sometimes the patient has had trouble swallowing pills, liquids and is even choked and spit up ice chips.  Other times he has been able to swallow solids if he chooses and really well and works on swallowing them very carefully.  He was admitted with chest painAnd dyspnea with chest x-ray suggestive of possible pneumonia.  Has been seen by cardiology and the feeling is that the chest pain is noncardiac.  He has had an aortic valve replacement with a bioprosthetic valve and has CAD status post CABG.  Cardiac workup is included enzymes.  AlsoCT scan of the chest and abdomen were negative for any acute problems and VQ scan showed low probability of pulmonary embolus.  Notable that CT of the chest did not suggest pneumonia.  Speech pathology evaluation resulted in significant pharyngeal dysphagia with prolonged coughing following small amounts of water.  It was Zydus and the family said he was able to eat a solid meal a few days ago without a great deal of problems.  The patient and his wife notes that he is choking and coughing even attempting to  swallow water.  Past Medical History:  Diagnosis Date  . Anemia   . Anxiety   . Aortic stenosis    a. s/p AVR with 39m Xochilt Conant pericardial valve 02/07/12 - post-op course complicated by pleural effusion requring thoracentesis/leg cellulitis 03/2012.  . Baker's cyst 07/23/12   Incidental finding of LE venous dopplers  . Blood transfusion    NO REACTION TO TRANSFUSION  . CAD (coronary artery disease)    a. s/p NSTEMI 12/2011:  LHC - Ostial left main 20%, ostial LAD 50%, mid 60-70%, ostial D1 40% and mid 40%, D2 70%, ostial circumflex occluded, ostial RCA 80-90%, LVEDP was 42. b.  s/p CABG x 3 at time of AVR (LiMA-LAD, SVG-2nd daigonal, SVG-PDA) 02/07/12 (post-op course noted above).  . Cancer ([facility]   Colon  . Cataract    right eye, hx of  . Cellulitis    a. RLE cellulitis 2 months post-operatively after AVR/CABG - serratia marcessans, tx with I&D/antibiotics  . CHF (congestive heart failure) [place]   . Chronic kidney disease   . Chronic systolic heart failure ([place]    a. TEE 01/2012: EF 25-30%, diffuse hypokinesis. b. Not on ACEI due to renal insufficiency.;  c. follow up  echo 08/06/12: EF 25%, mod diast dysfxn, AVR ok, mild MR, mod LAE, mild RAE, mild to mod RV systolic dysfunction  . Diabetes mellitus    borderline  . Flash pulmonary edema [place]    Post-cath 12/2011, went  into acute pulm edema requiring IV lasix and intubation  . GERD (gastroesophageal reflux disease)   . History of colon cancer    s/p colon resection  . HLD (hyperlipidemia)   . HTN (hypertension)    primary, Dr. Hollace Kinnier  . Ischemic cardiomyopathy   . Mitral regurgitation    Moderate by TEE 01/2012  . Myocardial infarction ([place]) 12/2011  . Pleural effusion    a. post-operatively after AVR/CABG s/p thoracentesis 03/2012 yielding 1L serosanguinous fluid.  . Stroke ([place]) 10/01   RIght Leg weakness    Past Surgical History:  Procedure Laterality Date  . AORTIC VALVE REPLACEMENT  02/07/2012   Procedure:  AORTIC VALVE REPLACEMENT (AVR);  Surgeon: Gaye Pollack, MD;  Location: [place];  Service: Open Heart Surgery;  Laterality: N/A;  . CARDIAC CATHETERIZATION     1.3.13  stopped breathing, put on ventilator for 5-6 days  . CATARACT EXTRACTION     rt  . CHEST TUBE INSERTION  07/24/2012   Procedure: INSERTION PLEURAL DRAINAGE CATHETER;  Surgeon: Gaye Pollack, MD;  Location: [place];  Service: Thoracic;  Laterality: Left;  . COLON RESECTION  1996  . COLONOSCOPY    . CORONARY ARTERY BYPASS GRAFT  02/07/2012   Procedure: CORONARY ARTERY BYPASS GRAFTING (CABG);  Surgeon: Gaye Pollack, MD;  Location: [place];  Service: Open Heart Surgery;  Laterality: N/A;  CABG x three; using right leg greater saphenous vein harvested endoscopically  . ERCP N/A 07/08/2014   Procedure: ENDOSCOPIC RETROGRADE CHOLANGIOPANCREATOGRAPHY (ERCP);  Surgeon: Missy Sabins, MD;  Location: [facility] ENDOSCOPY;  Service: Endoscopy;  Laterality: N/A;  . ESOPHAGEAL DILATION    . ESOPHAGOGASTRODUODENOSCOPY N/A 07/03/2014   Procedure: ESOPHAGOGASTRODUODENOSCOPY (EGD);  Surgeon: Arta Silence, MD;  Location: [facility] ENDOSCOPY;  Service: Endoscopy;  Laterality: N/A;  . ESOPHAGOSCOPY WITH DILITATION N/A 07/07/2014   Procedure: ESOPHAGOSCOPY WITH DILITATION/SAVARY DILATOR;  Surgeon: Rozetta Nunnery, MD;  Location: [facility] OR;  Service: ENT;  Laterality: N/A;  . EYE SURGERY  2009   cataract removed from right eye  . INGUINAL HERNIA REPAIR Right 09/13/2015   Procedure: OPEN RIGHT INGUINAL HERNIA;  Surgeon: Mickeal Skinner, MD;  Location: [place];  Service: General;  Laterality: Right;  . INSERTION OF MESH Right 09/13/2015   Procedure: INSERTION OF MESH;  Surgeon: Mickeal Skinner, MD;  Location: [facility];  Service: General;  Laterality: Right;  . LEFT HEART CATHETERIZATION WITH CORONARY ANGIOGRAM N/A 12/13/2011   Procedure: LEFT HEART CATHETERIZATION WITH CORONARY ANGIOGRAM;  Surgeon: Peter M Martinique, MD;  Location: [facility] C[facility] LAB;  Service: Cardiovascular;   Laterality: N/A;  . REMOVAL OF PLEURAL DRAINAGE CATHETER  09/27/2012   Procedure: REMOVAL OF PLEURAL DRAINAGE CATHETER;  Surgeon: Gaye Pollack, MD;  Location: [place];  Service: Thoracic;  Laterality: Left;  . TALC PLEURODESIS  08/30/2012   Procedure: Pietro Cassis;  Surgeon: Gaye Pollack, MD;  Location: [place];  Service: Thoracic;  Laterality: Left;  INSERTION OF TALC VIA LEFT PLEURX  . TALC PLEURODESIS  09/12/2012   Procedure: Pietro Cassis;  Surgeon: Gaye Pollack, MD;  Location: [place];  Service: Thoracic;  Laterality: Left;  . TONSILLECTOMY      Family History  Problem Relation Age of Onset  . Cancer Mother   . Heart attack Father   . Mental illness Brother   . Kidney failure Brother     Social History:  reports that  has never smoked. he has never used smokeless tobacco.  He reports that he does not drink alcohol or use drugs.  Allergies:  Allergies  Allergen Reactions  . Statins Other (See Comments)    Pain/weakness in legs  . Ambien [Zolpidem Tartrate] Anxiety and Other (See Comments)    Anxiety and hallucinations     Medications; Prior to Admission medications   Medication Sig Start Date End Date Taking? Authorizing Provider  acetaminophen (TYLENOL) 325 MG tablet Take 2 tablets (650 mg total) by mouth every 6 (six) hours as needed for mild pain or headache. 07/08/17  Yes Kilroy, Doreene Burke, PA-C  aspirin EC 81 MG EC tablet Take 1 tablet (81 mg total) by mouth daily. 06/27/17  Yes Shirley Friar, PA-C  carvedilol (COREG) 12.5 MG tablet Take 1 tablet (12.5 mg total) by mouth 2 (two) times daily with a meal. 06/26/17  Yes Tillery, Satira Mccallum, PA-C  ezetimibe (ZETIA) 10 MG tablet Take 1 tablet (10 mg total) by mouth at bedtime. 06/26/17  Yes Shirley Friar, PA-C  furosemide (LASIX) 40 MG tablet Take 2 tablets (80 mg total) by mouth 2 (two) times daily. May take additional 80 mg (2 tabs) as needed Patient taking differently: Take 80-160 mg by mouth See admin  instructions. 80 mg two times a day and may take an additional 80 mg once a day as needed for edema or fluid 10/09/17  Yes Larey Dresser, MD  glimepiride (AMARYL) 1 MG tablet TAKE 1MG BY MOUTH DAILY WITH BREAKFAST Patient taking differently: Take 1 mg by mouth daily with breakfast.  07/08/17  Yes Kilroy, Luke K, PA-C  hydrALAZINE (APRESOLINE) 50 MG tablet Take 1 tablet (50 mg total) by mouth 3 (three) times daily. 06/26/17  Yes Shirley Friar, PA-C  isosorbide mononitrate (IMDUR) 60 MG 24 hr tablet Take 2 tablets (120 mg total) by mouth daily. Patient taking differently: Take 60 mg by mouth 2 (two) times daily.  07/12/17  Yes Larey Dresser, MD  Multiple Vitamin (MULTIVITAMIN WITH MINERALS) TABS tablet Take 1 tablet by mouth daily.   Yes [provider]  nitroGLYCERIN (NITROSTAT) 0.4 MG SL tablet Place 1 tablet (0.4 mg total) under the tongue every 5 (five) minutes as needed for chest pain. As needed Patient taking differently: Place 0.4 mg under the tongue every 5 (five) minutes as needed for chest pain.  10/09/17  Yes Larey Dresser, MD  omeprazole (PRILOSEC) 20 MG capsule Take 20 mg by mouth daily.    Yes [provider]  potassium chloride SA (K-DUR,KLOR-CON) 20 MEQ tablet Take 1 tablet (20 mEq total) by mouth daily. 07/09/17  Yes Kilroy, Doreene Burke, PA-C  tamsulosin (FLOMAX) 0.4 MG CAPS capsule Take one capsule by mouth once daily after dinner Patient taking differently: Take 0.4 mg by mouth See admin instructions. 0.4 mg by mouth once a day after dinner 11/28/17  Yes [name], [name] L, DO  Travoprost, BAK Free, (TRAVATAN) 0.004 % SOLN ophthalmic solution Place 1 drop into both eyes at bedtime.    Yes [provider]  glimepiride (AMARYL) 1 MG tablet TAKE 1 TABLET BY MOUTH DAILY WITH BREAKFAST Patient not taking: Reported on 12/12/2017 12/08/17   Hollace Kinnier L, DO   . aspirin EC  81 mg Oral Daily  . azithromycin  500 mg Oral Q24H  . carvedilol  12.5 mg Oral BID  WC  . ezetimibe  10 mg Oral QHS  . furosemide  80 mg Oral BID  . heparin  5,000 Units Subcutaneous Q8H  .  hydrALAZINE  50 mg Oral TID  . insulin aspart  0-5 Units Subcutaneous QHS  . insulin aspart  0-9 Units Subcutaneous TID WC  . isosorbide mononitrate  120 mg Oral Daily  . latanoprost  1 drop Both Eyes QHS  . multivitamin with minerals  1 tablet Oral Daily  . pantoprazole  40 mg Oral Daily  . potassium chloride SA  20 mEq Oral Daily  . tamsulosin  0.4 mg Oral QPC supper   PRN Meds acetaminophen, gi cocktail, morphine injection, nitroGLYCERIN, ondansetron (ZOFRAN) IV Results for orders placed or performed during the hospital encounter of 12/12/17 (from the past 48 hour(s))  CBC     Status: Abnormal   Collection Time: 12/12/17  1:35 PM  Result Value Ref Range   WBC 6.3 4.0 - 10.5 K/uL   RBC 3.58 (L) 4.22 - 5.81 MIL/uL   Hemoglobin 11.4 (L) 13.0 - 17.0 g/dL   HCT 35.0 (L) 39.0 - 52.0 %   MCV 97.8 78.0 - 100.0 fL   MCH 31.8 26.0 - 34.0 pg   MCHC 32.6 30.0 - 36.0 g/dL   RDW 13.3 11.5 - 15.5 %   Platelets 139 (L) 150 - 400 K/uL  Basic metabolic panel     Status: Abnormal   Collection Time: 12/12/17  1:35 PM  Result Value Ref Range   Sodium 138 135 - 145 mmol/L   Potassium 4.1 3.5 - 5.1 mmol/L   Chloride 100 (L) 101 - 111 mmol/L   CO2 30 22 - 32 mmol/L   Glucose, Bld 142 (H) 65 - 99 mg/dL   BUN 68 (H) 6 - 20 mg/dL   Creatinine, Ser 2.88 (H) 0.61 - 1.24 mg/dL   Calcium 9.3 8.9 - 10.3 mg/dL   GFR calc non Af Amer 18 (L) >60 mL/min   GFR calc Af Amer 21 (L) >60 mL/min    Comment: (NOTE) The eGFR has been calculated using the CKD EPI equation. This calculation has not been validated in all clinical situations. eGFR's persistently <60 mL/min signify possible Chronic Kidney Disease.    Anion gap 8 5 - 15  Brain natriuretic peptide     Status: Abnormal   Collection Time: 12/12/17  1:35 PM  Result Value Ref Range   B Natriuretic Peptide 384.0 (H) 0.0 - 100.0 pg/mL  I-stat  troponin, ED     Status: None   Collection Time: 12/12/17  1:47 PM  Result Value Ref Range   Troponin i, poc 0.03 0.00 - 0.08 ng/mL   Comment 3            Comment: Due to the release kinetics of cTnI, a negative result within the first hours of the onset of symptoms does not rule out myocardial infarction with certainty. If myocardial infarction is still suspected, repeat the test at appropriate intervals.   D-dimer, quantitative (not at [facility])     Status: Abnormal   Collection Time: 12/12/17  3:38 PM  Result Value Ref Range   D-Dimer, Quant 0.59 (H) 0.00 - 0.50 ug/mL-FEU    Comment: (NOTE) At the manufacturer cut-off of 0.50 ug/mL FEU, this assay has been documented to exclude PE with a sensitivity and negative predictive value of 97 to 99%.  At this time, this assay has not been approved by the FDA to exclude DVT/VTE. Results should be correlated with clinical presentation.   I-stat troponin, ED     Status: None   Collection Time: 12/12/17  5:20 PM  Result Value  Ref Range   Troponin i, poc 0.03 0.00 - 0.08 ng/mL   Comment 3            Comment: Due to the release kinetics of cTnI, a negative result within the first hours of the onset of symptoms does not rule out myocardial infarction with certainty. If myocardial infarction is still suspected, repeat the test at appropriate intervals.   Culture, blood (routine x 2)     Status: None (Preliminary result)   Collection Time: 12/12/17  6:15 PM  Result Value Ref Range   Specimen Description BLOOD BLOOD RIGHT FOREARM    Special Requests      BOTTLES DRAWN AEROBIC AND ANAEROBIC Blood Culture adequate volume   Culture NO GROWTH < 12 HOURS    Report Status PENDING   Troponin I     Status: Abnormal   Collection Time: 12/12/17  6:15 PM  Result Value Ref Range   Troponin I 0.03 (HH) <0.03 ng/mL    Comment: CRITICAL RESULT CALLED TO, READ BACK BY AND VERIFIED WITH: [place] 12/12/17 D BRADLEY   Culture, blood (routine x  2)     Status: None (Preliminary result)   Collection Time: 12/12/17  6:18 PM  Result Value Ref Range   Specimen Description BLOOD LEFT ANTECUBITAL    Special Requests IN PEDIATRIC BOTTLE Blood Culture adequate volume    Culture NO GROWTH < 12 HOURS    Report Status PENDING   Strep pneumoniae urinary antigen     Status: None   Collection Time: 12/12/17  8:00 PM  Result Value Ref Range   Strep Pneumo Urinary Antigen NEGATIVE NEGATIVE    Comment:        Infection due to S. pneumoniae cannot be absolutely ruled out since the antigen present may be below the detection limit of the test.   Influenza panel by PCR (type A & B)     Status: None   Collection Time: 12/12/17  8:00 PM  Result Value Ref Range   Influenza A By PCR NEGATIVE NEGATIVE   Influenza B By PCR NEGATIVE NEGATIVE    Comment: (NOTE) The Xpert Xpress Flu assay is intended as an aid in the diagnosis of  influenza and should not be used as a sole basis for treatment.  This  assay is FDA approved for nasopharyngeal swab specimens only. Nasal  washings and aspirates are unacceptable for Xpert Xpress Flu testing.   Glucose, capillary     Status: Abnormal   Collection Time: 12/12/17  9:44 PM  Result Value Ref Range   Glucose-Capillary 150 (H) 65 - 99 mg/dL  Troponin I     Status: Abnormal   Collection Time: 12/12/17 11:44 PM  Result Value Ref Range   Troponin I 0.03 (HH) <0.03 ng/mL    Comment: CRITICAL VALUE NOTED.  VALUE IS CONSISTENT WITH PREVIOUSLY REPORTED AND CALLED VALUE.  Troponin I     Status: Abnormal   Collection Time: 12/13/17  5:36 AM  Result Value Ref Range   Troponin I 0.04 (HH) <0.03 ng/mL    Comment: CRITICAL VALUE NOTED.  VALUE IS CONSISTENT WITH PREVIOUSLY REPORTED AND CALLED VALUE.  Hemoglobin A1c     Status: Abnormal   Collection Time: 12/13/17  5:36 AM  Result Value Ref Range   Hgb A1c MFr Bld 6.1 (H) 4.8 - 5.6 %    Comment: (NOTE) Pre diabetes:          5.7%-6.4% Diabetes:               >  6.4% Glycemic control for   <7.0% adults with diabetes    Mean Plasma Glucose 128.37 mg/dL  Glucose, capillary     Status: Abnormal   Collection Time: 12/13/17  7:49 AM  Result Value Ref Range   Glucose-Capillary 129 (H) 65 - 99 mg/dL   Comment 1 Notify RN    Comment 2 Document in Chart   Basic metabolic panel     Status: Abnormal   Collection Time: 12/13/17  8:07 AM  Result Value Ref Range   Sodium 140 135 - 145 mmol/L   Potassium 4.2 3.5 - 5.1 mmol/L   Chloride 96 (L) 101 - 111 mmol/L   CO2 30 22 - 32 mmol/L   Glucose, Bld 140 (H) 65 - 99 mg/dL   BUN 68 (H) 6 - 20 mg/dL   Creatinine, Ser 3.26 (H) 0.61 - 1.24 mg/dL   Calcium 9.4 8.9 - 10.3 mg/dL   GFR calc non Af Amer 15 (L) >60 mL/min   GFR calc Af Amer 18 (L) >60 mL/min    Comment: (NOTE) The eGFR has been calculated using the CKD EPI equation. This calculation has not been validated in all clinical situations. eGFR's persistently <60 mL/min signify possible Chronic Kidney Disease.    Anion gap 14 5 - 15  CBC with Differential/Platelet     Status: Abnormal   Collection Time: 12/13/17  8:07 AM  Result Value Ref Range   WBC 15.8 (H) 4.0 - 10.5 K/uL   RBC 4.01 (L) 4.22 - 5.81 MIL/uL   Hemoglobin 13.3 13.0 - 17.0 g/dL   HCT 40.3 39.0 - 52.0 %   MCV 100.5 (H) 78.0 - 100.0 fL   MCH 33.2 26.0 - 34.0 pg   MCHC 33.0 30.0 - 36.0 g/dL   RDW 13.7 11.5 - 15.5 %   Platelets 148 (L) 150 - 400 K/uL   Neutrophils Relative % 86 %   Neutro Abs 13.5 (H) 1.7 - 7.7 K/uL   Lymphocytes Relative 10 %   Lymphs Abs 1.6 0.7 - 4.0 K/uL   Monocytes Relative 4 %   Monocytes Absolute 0.7 0.1 - 1.0 K/uL   Eosinophils Relative 0 %   Eosinophils Absolute 0.0 0.0 - 0.7 K/uL   Basophils Relative 0 %   Basophils Absolute 0.0 0.0 - 0.1 K/uL  Glucose, capillary     Status: Abnormal   Collection Time: 12/13/17 11:45 AM  Result Value Ref Range   Glucose-Capillary 174 (H) 65 - 99 mg/dL   Comment 1 Notify RN    Comment 2 Document in Chart     Ct  Abdomen Pelvis Wo Contrast  Result Date: 12/12/2017 CLINICAL DATA:  Left-sided chest pain and abdominal pain beginning this morning. EXAM: CT CHEST, ABDOMEN AND PELVIS WITHOUT CONTRAST TECHNIQUE: Multidetector CT imaging of the chest, abdomen and pelvis was performed following the standard protocol without IV contrast. COMPARISON:  Chest radiograph 12/12/2017. Chest, abdomen, and pelvis CT 06/25/2017. FINDINGS: CT CHEST FINDINGS Cardiovascular: Aortic and three-vessel coronary artery atherosclerosis. Prior CABG and aortic valve replacement. No aortic aneurysm. Mild cardiomegaly. No pericardial effusion. Mediastinum/Nodes: No enlarged axillary, mediastinal, or hilar lymph nodes. Unremarkable thyroid and esophagus. Lungs/Pleura: No pleural effusion or pneumothorax. Unchanged pleural thickening and calcification in the lower left hemithorax consistent with history of talc pleurodesis. Unchanged 4 mm subpleural right upper lobe nodule (series 5, image 34). Mild scarring or atelectasis in both lung bases. Motion artifact mildly limits assessment of the lingula and basilar left lower lobe. There may  be mild chronic centrilobular ground-glass nodularity in the left lower lobe, however if present this has not progressed from the prior CT. No consolidative opacities are seen. Musculoskeletal: No acute osseous abnormality or suspicious osseous lesion. Thoracic spondylosis. CT ABDOMEN PELVIS FINDINGS Hepatobiliary: Unchanged right hepatic lobe partial resection or atrophy. No focal liver abnormality identified. Prior cholecystectomy. No biliary dilatation. Pancreas: Mild fatty infiltration greatest in the head. No ductal dilatation or acute inflammation. Spleen: Unremarkable. Adrenals/Urinary Tract: Unremarkable adrenal glands. Asymmetric, advanced left renal atrophy is unchanged, as is an exophytic 4.8 x 4.0 cm low-density lesion extending posterior to the left kidney consistent with a cyst. Subcentimeter exophytic lesion  off the lower pole of the right kidney is unchanged and too small to fully characterize. No renal calculi or hydronephrosis. Moderately distended bladder are. Stomach/Bowel: The stomach is within normal limits. Prior right hemicolectomy. There is left-sided colonic diverticulosis without evidence of diverticulitis. There is no evidence of bowel obstruction. Vascular/Lymphatic: Aortoiliac atherosclerosis without evidence of aneurysm. No enlarged lymph nodes. Reproductive: Unchanged prostate enlargement. Other: Bilateral fat containing inguinal hernias. The left inguinal hernia also contains a small portion of sigmoid colon without obstructive or acute inflammatory changes. Tiny fat containing ventral abdominal hernias superior to the umbilicus. No intraperitoneal free fluid. Musculoskeletal: No acute osseous abnormality or suspicious osseous lesion. IMPRESSION: 1. No acute abnormality identified in the chest, abdomen, or pelvis. 2.  Aortic Atherosclerosis (ICD10-I70.0).  No aneurysm. 3. Unchanged inguinal hernias. Small amount of colon in the left-sided hernia without obstruction or acute inflammatory change. Electronically Signed   By: Logan Bores M.D.   On: 12/12/2017 19:31   Ct Chest Wo Contrast  Result Date: 12/12/2017 CLINICAL DATA:  Left-sided chest pain and abdominal pain beginning this morning. EXAM: CT CHEST, ABDOMEN AND PELVIS WITHOUT CONTRAST TECHNIQUE: Multidetector CT imaging of the chest, abdomen and pelvis was performed following the standard protocol without IV contrast. COMPARISON:  Chest radiograph 12/12/2017. Chest, abdomen, and pelvis CT 06/25/2017. FINDINGS: CT CHEST FINDINGS Cardiovascular: Aortic and three-vessel coronary artery atherosclerosis. Prior CABG and aortic valve replacement. No aortic aneurysm. Mild cardiomegaly. No pericardial effusion. Mediastinum/Nodes: No enlarged axillary, mediastinal, or hilar lymph nodes. Unremarkable thyroid and esophagus. Lungs/Pleura: No pleural  effusion or pneumothorax. Unchanged pleural thickening and calcification in the lower left hemithorax consistent with history of talc pleurodesis. Unchanged 4 mm subpleural right upper lobe nodule (series 5, image 34). Mild scarring or atelectasis in both lung bases. Motion artifact mildly limits assessment of the lingula and basilar left lower lobe. There may be mild chronic centrilobular ground-glass nodularity in the left lower lobe, however if present this has not progressed from the prior CT. No consolidative opacities are seen. Musculoskeletal: No acute osseous abnormality or suspicious osseous lesion. Thoracic spondylosis. CT ABDOMEN PELVIS FINDINGS Hepatobiliary: Unchanged right hepatic lobe partial resection or atrophy. No focal liver abnormality identified. Prior cholecystectomy. No biliary dilatation. Pancreas: Mild fatty infiltration greatest in the head. No ductal dilatation or acute inflammation. Spleen: Unremarkable. Adrenals/Urinary Tract: Unremarkable adrenal glands. Asymmetric, advanced left renal atrophy is unchanged, as is an exophytic 4.8 x 4.0 cm low-density lesion extending posterior to the left kidney consistent with a cyst. Subcentimeter exophytic lesion off the lower pole of the right kidney is unchanged and too small to fully characterize. No renal calculi or hydronephrosis. Moderately distended bladder are. Stomach/Bowel: The stomach is within normal limits. Prior right hemicolectomy. There is left-sided colonic diverticulosis without evidence of diverticulitis. There is no evidence of bowel obstruction. Vascular/Lymphatic: Aortoiliac atherosclerosis  without evidence of aneurysm. No enlarged lymph nodes. Reproductive: Unchanged prostate enlargement. Other: Bilateral fat containing inguinal hernias. The left inguinal hernia also contains a small portion of sigmoid colon without obstructive or acute inflammatory changes. Tiny fat containing ventral abdominal hernias superior to the  umbilicus. No intraperitoneal free fluid. Musculoskeletal: No acute osseous abnormality or suspicious osseous lesion. IMPRESSION: 1. No acute abnormality identified in the chest, abdomen, or pelvis. 2.  Aortic Atherosclerosis (ICD10-I70.0).  No aneurysm. 3. Unchanged inguinal hernias. Small amount of colon in the left-sided hernia without obstruction or acute inflammatory change. Electronically Signed   By: Logan Bores M.D.   On: 12/12/2017 19:31   Nm Pulmonary Perf And Vent  Result Date: 12/12/2017 CLINICAL DATA:  Shortness of breath, history of hypertension, CHF, flash pulmonary edema, stroke, MI CT, colon cancer, diabetes mellitus, ischemic cardiomyopathy EXAM: NUCLEAR MEDICINE VENTILATION - PERFUSION LUNG SCAN TECHNIQUE: Ventilation images were obtained in multiple projections using inhaled aerosol Tc-64mDTPA. Perfusion images were obtained in multiple projections after intravenous injection of Tc-952mAA. RADIOPHARMACEUTICALS:  30 mCi Technetium-9965mPA aerosol inhalation and 4 mCi Technetium-69m46m IV COMPARISON:  None. FINDINGS: Ventilation: Mildly diminished ventilation LEFT upper lobe versus RIGHT. Enlargement of cardiac silhouette. No other ventilatory defects. Perfusion: Enlargement of cardiac silhouette. No other segmental or subsegmental perfusion defects. Chest radiograph: LEFT basilar opacity question pneumonia. Enlargement of cardiac silhouette post AVR. IMPRESSION: Very low probability for pulmonary embolism. Electronically Signed   By: MarkLavonia Dana.   On: 12/12/2017 21:26   Dg Chest Port 1 View  Result Date: 12/12/2017 CLINICAL DATA:  Shortness of breath and chest pain EXAM: PORTABLE CHEST 1 VIEW COMPARISON:  October 08, 2017 FINDINGS: There is a focal area of ill-defined opacity in the left base, concerning for focal pneumonia. Lungs elsewhere are clear. Heart is enlarged with pulmonary vascularity within normal limits. Patient is status post aortic valve replacement and coronary  artery/ internal mammary bypass grafting. No evident adenopathy. No bone lesions. There is aortic atherosclerosis. IMPRESSION: Ill-defined opacity left base concerning for focal pneumonia. Lungs elsewhere clear. Stable cardiomegaly. Postoperative change is noted. There is aortic atherosclerosis. Aortic Atherosclerosis (ICD10-I70.0). Followup PA and lateral chest radiographs recommended in 3-4 weeks following trial of antibiotic therapy to ensure resolution and exclude underlying malignancy. Electronically Signed   By: WillLowella Grip M.D.   On: 12/12/2017 13:51               Blood pressure (!) 113/49, pulse 74, temperature 97.8 F (36.6 C), temperature source Oral, resp. rate 18, height _0  (1.727 m), weight 68.5 kg (151 lb 0.2 oz), SpO2 92 %.  Physical exam:   General--white male in no acute distress ENT--throat is grossly normal there are no obvious lesions in the back of his throat Neck--full range of motion without masses Heart--normal S1 and S2 without murmurs or gallops Lungs--clear Abdomen--soft and nontender Psych--alert and oriented, mood and affect are appropriate   Assessment: 1.  Oral pharyngeal dysphagia.  The patient had significant oral pharyngeal dysphagia with speech pathology evaluation and has had a history of proximal stricture that is required dilatations in the past by ENT.  Considered getting a barium swallow to evaluate exactly what the problem was but feel we should wait until ENT sees to get their okay.  I doubt this is esophageal dysphagia since he occasionally can swallow solids without any substernal discomfort and has been on chronic omeprazole for some time. 2.  History of congestive heart failure,  status post AVR with bioprosthetic valve, CAD status post CABG 3.  History of colon cancer 4.  GERD.  On chronic omeprazole 5.  History of stroke with full recovery  Plan: 1.  I would have the patient seen by ENT.  He will likely need a thorough  exam and probable dilatation after intubation and anesthesia in order to protect the airway.  Exactly when this should be done is unclear.  We may need to obtain a barium swallow first but will wait for ENT to see and get their opinion.   Nancy Fetter 12/13/2017, 4:40 PM   This note was created using voice recognition software and minor errors may Have occurred unintentionally. Pager: [phone] If no answer or after hours call [phone]

## 2017-12-13 NOTE — Progress Notes (Signed)
PROGRESS NOTE    [name] [name]  PYK:[id number] DOB: Feb 16, 1926 DOA: 12/12/2017 PCP: Gayland Curry, DO    Brief Narrative:  84 y.o. male with medical history significant of coronary artery disease, CABG, bioprosthetic AVR, anemia, chronic kidney disease stage IV, chronic systolic heart failure and colon cancer presented today with complaints of sudden onset of chest pain.  Patient states that he started having chest pain early this morning after he woke up, mostly left-sided, sharp in nature, intermittent, up to 7 out of 10 intensity with radiation to the whole chest and his abdomen.  He also complained of some shortness of breath.  Chest pain was noted with nitroglycerin.  Patient also complains of intermittent cough but no productive sputum, fever, nausea, vomiting, diarrhea, chest trauma.  Patient denied any recent weight gain or decreased urine output.    Assessment & Plan:   Active Problems:   Chronic combined systolic and diastolic heart failure, NYHA class 2 (HCC)   Chronic kidney disease (CKD), stage IV (severe) (HCC)   Diabetes mellitus type 2, controlled, with complications (HCC)   Chest pain   Community acquired pneumonia   Pneumonia  Non-cardiac Chest pain -Unrelieved by nitroglycerin -Suspect esophogeal in etiology. CT chest without evidence on PNA -On empiric antibiotics for PNA. -VQ without evidence of PE -2d echo with EF of 40-45% -Chart reviewed. Patient with hx of proximal esophageal strictures s/p prior dilation in 2015. In room, pt noted to have difficulty swallowing ice chip and attempted to "fish" ice chip out -Consulted GI. Per history, ENT had performed dilation in past given location of stricture. Will consult Dr. Lucia Gaskins for input  Probable left-sided community-acquired bacterial pneumonia -Continue Rocephin and Zithromax.  Follow blood cultures and sputum cultures.  Urine Legionella and streptococcal antigen. -Oxygen supplementation if needed  Coronary  artery disease status post CABG -History of recurrent chest pain with multiple ED visits and admissions -Initial troponin negative.  Cycle troponins.  Cardiology following. -Continue aspirin, Coreg, Imdur, hydralazine as tolerated  Chronic systolic CHF with ischemic cardiomyopathy with history of bioprosthetic AVR -EF of 25-30% on 7/18. -Continue Lasix 80 mg IV twice daily as per cardiology recommendations -Strict input and output.  Daily weights.  Continue Coreg, Imdur, hydralazine.  Not on ACE inhibitor because of renal function -repeat bmet in AM  CKD stage IV -Monitor renal function while on IV diuretics -Stable at this time  Diabetes mellitus type 2 -Hold oral meds.  Sliding scale insulin.  Hemoglobin A1c noted to be 6.1  DVT prophylaxis: Heparin subQ Code Status: DNR Family Communication: Pt in room, family at bedside, son-in law over phone Disposition Plan: Uncertain at this time  Consultants:   GI  ENT  Cardiology  Procedures:     Antimicrobials: Anti-infectives (From admission, onward)   Start     Dose/Rate Route Frequency Ordered Stop   12/13/17 0800  cefTRIAXone (ROCEPHIN) 1 g in dextrose 5 % 50 mL IVPB     1 g 100 mL/hr over 30 Minutes Intravenous Every 24 hours 12/12/17 1959 12/20/17 0759   12/13/17 0800  azithromycin (ZITHROMAX) tablet 500 mg     500 mg Oral Every 24 hours 12/12/17 1959 12/20/17 0759   12/12/17 1630  cefTRIAXone (ROCEPHIN) 1 g in dextrose 5 % 50 mL IVPB     1 g 100 mL/hr over 30 Minutes Intravenous  Once 12/12/17 1622 12/12/17 1736   12/12/17 1630  azithromycin (ZITHROMAX) tablet 500 mg     500 mg Oral  Once 12/12/17 1622 12/12/17 1705       Subjective: Complained of vomiting this AM, did not tolerate ice chip  Objective: Vitals:   12/13/17 0000 12/13/17 0500 12/13/17 0804 12/13/17 0826  BP: (!) 149/49 (!) 117/41 (!) 132/41 (!) 124/59  Pulse: 63 60 78 81  Resp: 18 18 18    Temp: 98 F (36.7 C) 98 F (36.7 C) 99.1 F (37.3  C)   TempSrc: Oral Oral Oral   SpO2: 98% 98% 93%   Weight:  68.5 kg (151 lb 0.2 oz)    Height:        Intake/Output Summary (Last 24 hours) at 12/13/2017 1411 Last data filed at 12/13/2017 0117 Gross per 24 hour  Intake 290 ml  Output 750 ml  Net -460 ml   Filed Weights   12/12/17 1722 12/12/17 2057 12/13/17 0500  Weight: 70.8 kg (156 lb) 68.4 kg (150 lb 12.7 oz) 68.5 kg (151 lb 0.2 oz)    Examination:  General exam: Appears calm and comfortable  Respiratory system: Clear to auscultation. Respiratory effort normal. Cardiovascular system: S1 & S2 heard, RRR Gastrointestinal system: Abdomen is nondistended, soft and nontender. No organomegaly or masses felt. Normal bowel sounds heard. Central nervous system: Alert and oriented. No focal neurological deficits. Extremities: Symmetric 5 x 5 power. Skin: No rashes, lesions  Psychiatry: Judgement and insight appear normal. Mood & affect appropriate.   Data Reviewed: I have personally reviewed following labs and imaging studies  CBC: Recent Labs  Lab 12/12/17 1335 12/13/17 0807  WBC 6.3 15.8*  NEUTROABS  --  13.5*  HGB 11.4* 13.3  HCT 35.0* 40.3  MCV 97.8 100.5*  PLT 139* 588*   Basic Metabolic Panel: Recent Labs  Lab 12/12/17 1335 12/13/17 0807  NA 138 140  K 4.1 4.2  CL 100* 96*  CO2 30 30  GLUCOSE 142* 140*  BUN 68* 68*  CREATININE 2.88* 3.26*  CALCIUM 9.3 9.4   GFR: Estimated Creatinine Clearance: 14.9 mL/min (A) (by C-G formula based on SCr of 3.26 mg/dL (H)). Liver Function Tests: No results for input(s): AST, ALT, ALKPHOS, BILITOT, PROT, ALBUMIN in the last 168 hours. No results for input(s): LIPASE, AMYLASE in the last 168 hours. No results for input(s): AMMONIA in the last 168 hours. Coagulation Profile: No results for input(s): INR, PROTIME in the last 168 hours. Cardiac Enzymes: Recent Labs  Lab 12/12/17 1815 12/12/17 2344 12/13/17 0536  TROPONINI 0.03* 0.03* 0.04*   BNP (last 3 results) No  results for input(s): PROBNP in the last 8760 hours. HbA1C: Recent Labs    12/13/17 0536  HGBA1C 6.1*   CBG: Recent Labs  Lab 12/12/17 2144 12/13/17 0749 12/13/17 1145  GLUCAP 150* 129* 174*   Lipid Profile: No results for input(s): CHOL, HDL, LDLCALC, TRIG, CHOLHDL, LDLDIRECT in the last 72 hours. Thyroid Function Tests: No results for input(s): TSH, T4TOTAL, FREET4, T3FREE, THYROIDAB in the last 72 hours. Anemia Panel: No results for input(s): VITAMINB12, FOLATE, FERRITIN, TIBC, IRON, RETICCTPCT in the last 72 hours. Sepsis Labs: No results for input(s): PROCALCITON, LATICACIDVEN in the last 168 hours.  Recent Results (from the past 240 hour(s))  Culture, blood (routine x 2)     Status: None (Preliminary result)   Collection Time: 12/12/17  6:15 PM  Result Value Ref Range Status   Specimen Description BLOOD BLOOD RIGHT FOREARM  Final   Special Requests   Final    BOTTLES DRAWN AEROBIC AND ANAEROBIC Blood Culture adequate volume  Culture NO GROWTH < 12 HOURS  Final   Report Status PENDING  Incomplete  Culture, blood (routine x 2)     Status: None (Preliminary result)   Collection Time: 12/12/17  6:18 PM  Result Value Ref Range Status   Specimen Description BLOOD LEFT ANTECUBITAL  Final   Special Requests IN PEDIATRIC BOTTLE Blood Culture adequate volume  Final   Culture NO GROWTH < 12 HOURS  Final   Report Status PENDING  Incomplete     Radiology Studies: Ct Abdomen Pelvis Wo Contrast  Result Date: 12/12/2017 CLINICAL DATA:  Left-sided chest pain and abdominal pain beginning this morning. EXAM: CT CHEST, ABDOMEN AND PELVIS WITHOUT CONTRAST TECHNIQUE: Multidetector CT imaging of the chest, abdomen and pelvis was performed following the standard protocol without IV contrast. COMPARISON:  Chest radiograph 12/12/2017. Chest, abdomen, and pelvis CT 06/25/2017. FINDINGS: CT CHEST FINDINGS Cardiovascular: Aortic and three-vessel coronary artery atherosclerosis. Prior CABG and  aortic valve replacement. No aortic aneurysm. Mild cardiomegaly. No pericardial effusion. Mediastinum/Nodes: No enlarged axillary, mediastinal, or hilar lymph nodes. Unremarkable thyroid and esophagus. Lungs/Pleura: No pleural effusion or pneumothorax. Unchanged pleural thickening and calcification in the lower left hemithorax consistent with history of talc pleurodesis. Unchanged 4 mm subpleural right upper lobe nodule (series 5, image 34). Mild scarring or atelectasis in both lung bases. Motion artifact mildly limits assessment of the lingula and basilar left lower lobe. There may be mild chronic centrilobular ground-glass nodularity in the left lower lobe, however if present this has not progressed from the prior CT. No consolidative opacities are seen. Musculoskeletal: No acute osseous abnormality or suspicious osseous lesion. Thoracic spondylosis. CT ABDOMEN PELVIS FINDINGS Hepatobiliary: Unchanged right hepatic lobe partial resection or atrophy. No focal liver abnormality identified. Prior cholecystectomy. No biliary dilatation. Pancreas: Mild fatty infiltration greatest in the head. No ductal dilatation or acute inflammation. Spleen: Unremarkable. Adrenals/Urinary Tract: Unremarkable adrenal glands. Asymmetric, advanced left renal atrophy is unchanged, as is an exophytic 4.8 x 4.0 cm low-density lesion extending posterior to the left kidney consistent with a cyst. Subcentimeter exophytic lesion off the lower pole of the right kidney is unchanged and too small to fully characterize. No renal calculi or hydronephrosis. Moderately distended bladder are. Stomach/Bowel: The stomach is within normal limits. Prior right hemicolectomy. There is left-sided colonic diverticulosis without evidence of diverticulitis. There is no evidence of bowel obstruction. Vascular/Lymphatic: Aortoiliac atherosclerosis without evidence of aneurysm. No enlarged lymph nodes. Reproductive: Unchanged prostate enlargement. Other: Bilateral  fat containing inguinal hernias. The left inguinal hernia also contains a small portion of sigmoid colon without obstructive or acute inflammatory changes. Tiny fat containing ventral abdominal hernias superior to the umbilicus. No intraperitoneal free fluid. Musculoskeletal: No acute osseous abnormality or suspicious osseous lesion. IMPRESSION: 1. No acute abnormality identified in the chest, abdomen, or pelvis. 2.  Aortic Atherosclerosis (ICD10-I70.0).  No aneurysm. 3. Unchanged inguinal hernias. Small amount of colon in the left-sided hernia without obstruction or acute inflammatory change. Electronically Signed   By: Logan Bores M.D.   On: 12/12/2017 19:31   Ct Chest Wo Contrast  Result Date: 12/12/2017 CLINICAL DATA:  Left-sided chest pain and abdominal pain beginning this morning. EXAM: CT CHEST, ABDOMEN AND PELVIS WITHOUT CONTRAST TECHNIQUE: Multidetector CT imaging of the chest, abdomen and pelvis was performed following the standard protocol without IV contrast. COMPARISON:  Chest radiograph 12/12/2017. Chest, abdomen, and pelvis CT 06/25/2017. FINDINGS: CT CHEST FINDINGS Cardiovascular: Aortic and three-vessel coronary artery atherosclerosis. Prior CABG and aortic valve replacement. No aortic  aneurysm. Mild cardiomegaly. No pericardial effusion. Mediastinum/Nodes: No enlarged axillary, mediastinal, or hilar lymph nodes. Unremarkable thyroid and esophagus. Lungs/Pleura: No pleural effusion or pneumothorax. Unchanged pleural thickening and calcification in the lower left hemithorax consistent with history of talc pleurodesis. Unchanged 4 mm subpleural right upper lobe nodule (series 5, image 34). Mild scarring or atelectasis in both lung bases. Motion artifact mildly limits assessment of the lingula and basilar left lower lobe. There may be mild chronic centrilobular ground-glass nodularity in the left lower lobe, however if present this has not progressed from the prior CT. No consolidative opacities  are seen. Musculoskeletal: No acute osseous abnormality or suspicious osseous lesion. Thoracic spondylosis. CT ABDOMEN PELVIS FINDINGS Hepatobiliary: Unchanged right hepatic lobe partial resection or atrophy. No focal liver abnormality identified. Prior cholecystectomy. No biliary dilatation. Pancreas: Mild fatty infiltration greatest in the head. No ductal dilatation or acute inflammation. Spleen: Unremarkable. Adrenals/Urinary Tract: Unremarkable adrenal glands. Asymmetric, advanced left renal atrophy is unchanged, as is an exophytic 4.8 x 4.0 cm low-density lesion extending posterior to the left kidney consistent with a cyst. Subcentimeter exophytic lesion off the lower pole of the right kidney is unchanged and too small to fully characterize. No renal calculi or hydronephrosis. Moderately distended bladder are. Stomach/Bowel: The stomach is within normal limits. Prior right hemicolectomy. There is left-sided colonic diverticulosis without evidence of diverticulitis. There is no evidence of bowel obstruction. Vascular/Lymphatic: Aortoiliac atherosclerosis without evidence of aneurysm. No enlarged lymph nodes. Reproductive: Unchanged prostate enlargement. Other: Bilateral fat containing inguinal hernias. The left inguinal hernia also contains a small portion of sigmoid colon without obstructive or acute inflammatory changes. Tiny fat containing ventral abdominal hernias superior to the umbilicus. No intraperitoneal free fluid. Musculoskeletal: No acute osseous abnormality or suspicious osseous lesion. IMPRESSION: 1. No acute abnormality identified in the chest, abdomen, or pelvis. 2.  Aortic Atherosclerosis (ICD10-I70.0).  No aneurysm. 3. Unchanged inguinal hernias. Small amount of colon in the left-sided hernia without obstruction or acute inflammatory change. Electronically Signed   By: Logan Bores M.D.   On: 12/12/2017 19:31   Nm Pulmonary Perf And Vent  Result Date: 12/12/2017 CLINICAL DATA:  Shortness of  breath, history of hypertension, CHF, flash pulmonary edema, stroke, MI CT, colon cancer, diabetes mellitus, ischemic cardiomyopathy EXAM: NUCLEAR MEDICINE VENTILATION - PERFUSION LUNG SCAN TECHNIQUE: Ventilation images were obtained in multiple projections using inhaled aerosol Tc-54m DTPA. Perfusion images were obtained in multiple projections after intravenous injection of Tc-34m MAA. RADIOPHARMACEUTICALS:  30 mCi Technetium-1m DTPA aerosol inhalation and 4 mCi Technetium-39m MAA IV COMPARISON:  None. FINDINGS: Ventilation: Mildly diminished ventilation LEFT upper lobe versus RIGHT. Enlargement of cardiac silhouette. No other ventilatory defects. Perfusion: Enlargement of cardiac silhouette. No other segmental or subsegmental perfusion defects. Chest radiograph: LEFT basilar opacity question pneumonia. Enlargement of cardiac silhouette post AVR. IMPRESSION: Very low probability for pulmonary embolism. Electronically Signed   By: Lavonia Dana M.D.   On: 12/12/2017 21:26   Dg Chest Port 1 View  Result Date: 12/12/2017 CLINICAL DATA:  Shortness of breath and chest pain EXAM: PORTABLE CHEST 1 VIEW COMPARISON:  October 08, 2017 FINDINGS: There is a focal area of ill-defined opacity in the left base, concerning for focal pneumonia. Lungs elsewhere are clear. Heart is enlarged with pulmonary vascularity within normal limits. Patient is status post aortic valve replacement and coronary artery/ internal mammary bypass grafting. No evident adenopathy. No bone lesions. There is aortic atherosclerosis. IMPRESSION: Ill-defined opacity left base concerning for focal pneumonia. Lungs elsewhere clear. Stable  cardiomegaly. Postoperative change is noted. There is aortic atherosclerosis. Aortic Atherosclerosis (ICD10-I70.0). Followup PA and lateral chest radiographs recommended in 3-4 weeks following trial of antibiotic therapy to ensure resolution and exclude underlying malignancy. Electronically Signed   By: Lowella Grip  III M.D.   On: 12/12/2017 13:51    Scheduled Meds: . aspirin EC  81 mg Oral Daily  . azithromycin  500 mg Oral Q24H  . carvedilol  12.5 mg Oral BID WC  . ezetimibe  10 mg Oral QHS  . furosemide  80 mg Oral BID  . heparin  5,000 Units Subcutaneous Q8H  . hydrALAZINE  50 mg Oral TID  . insulin aspart  0-5 Units Subcutaneous QHS  . insulin aspart  0-9 Units Subcutaneous TID WC  . isosorbide mononitrate  120 mg Oral Daily  . latanoprost  1 drop Both Eyes QHS  . multivitamin with minerals  1 tablet Oral Daily  . pantoprazole  40 mg Oral Daily  . potassium chloride SA  20 mEq Oral Daily  . tamsulosin  0.4 mg Oral QPC supper   Continuous Infusions: . cefTRIAXone (ROCEPHIN)  IV Stopped (12/13/17 3664)     LOS: 0 days   Marylu Lund, MD [facility] Pager [phone]  If 7PM-7AM, please contact night-coverage [email or web address] Password [email or web address] 12/13/2017, 2:11 PM

## 2017-12-13 NOTE — Progress Notes (Signed)
Advanced Heart Failure Rounding Note  PCP:  Primary Cardiologist: Dr. Aundra Dubin   Subjective:    No further pain. Nauseated last night and received Zofran and Morphine.  Has been slightly "out of it" per wife.   Alert to person this am, but slow to respond with date or place. No obvious focal abnormalities at this time.   BMET pending.  WBCs 16.   Troponin flat without trend.  VQ scan 12/12/17 negative/"very low probability for PE" CT Chest/Abd/Pelvis 12/12/17 negative for any acute abnormality.  Objective:   Weight Range: 151 lb 0.2 oz (68.5 kg) Body mass index is 22.96 kg/m.   Vital Signs:   Temp:  [97.5 F (36.4 C)-99.1 F (37.3 C)] 99.1 F (37.3 C) (01/03 0804) Pulse Rate:  [56-78] 78 (01/03 0804) Resp:  [17-32] 18 (01/03 0804) BP: (117-158)/(41-73) 132/41 (01/03 0804) SpO2:  [93 %-98 %] 93 % (01/03 0804) Weight:  [150 lb 12.7 oz (68.4 kg)-156 lb (70.8 kg)] 151 lb 0.2 oz (68.5 kg) (01/03 0500) Last BM Date: 12/11/17  Weight change: Filed Weights   12/12/17 1722 12/12/17 2057 12/13/17 0500  Weight: 156 lb (70.8 kg) 150 lb 12.7 oz (68.4 kg) 151 lb 0.2 oz (68.5 kg)   Intake/Output:   Intake/Output Summary (Last 24 hours) at 12/13/2017 0806 Last data filed at 12/13/2017 0117 Gross per 24 hour  Intake 290 ml  Output 750 ml  Net -460 ml    Physical Exam    General:  Elderly. Frail.  HEENT: Normal Neck: Supple. JVP 7-8 cm. Carotids 2+ bilat; no bruits. No lymphadenopathy or thyromegaly appreciated. Cor: PMI nondisplaced. Regular rate & rhythm. 2/6 SEM RUSB.  Lungs: Mildly diminished basilar sounds with slight crackles. Abdomen: Soft, nontender, nondistended. No hepatosplenomegaly. No bruits or masses. Good bowel sounds. Extremities: No cyanosis, clubbing, rash, edema Neuro: Alert to person. Moves all extremities without difficulty. Equal grip strength.  Speech slightly slurred. No obvious facial droop.   Telemetry   NSR 80-90s, personally reviewed.  EKG    No  new tracing.   Labs    CBC Recent Labs    12/12/17 1335  WBC 6.3  HGB 11.4*  HCT 35.0*  MCV 97.8  PLT 951*   Basic Metabolic Panel Recent Labs    12/12/17 1335  NA 138  K 4.1  CL 100*  CO2 30  GLUCOSE 142*  BUN 68*  CREATININE 2.88*  CALCIUM 9.3   Liver Function Tests No results for input(s): AST, ALT, ALKPHOS, BILITOT, PROT, ALBUMIN in the last 72 hours. No results for input(s): LIPASE, AMYLASE in the last 72 hours. Cardiac Enzymes Recent Labs    12/12/17 1815 12/12/17 2344 12/13/17 0536  TROPONINI 0.03* 0.03* 0.04*    BNP: BNP (last 3 results) Recent Labs    07/05/17 1517 07/11/17 1734 12/12/17 1335  BNP 529.6* 281.4* 384.0*    ProBNP (last 3 results) No results for input(s): PROBNP in the last 8760 hours.   D-Dimer Recent Labs    12/12/17 1538  DDIMER 0.59*   Hemoglobin A1C Recent Labs    12/13/17 0536  HGBA1C 6.1*   Fasting Lipid Panel No results for input(s): CHOL, HDL, LDLCALC, TRIG, CHOLHDL, LDLDIRECT in the last 72 hours. Thyroid Function Tests No results for input(s): TSH, T4TOTAL, T3FREE, THYROIDAB in the last 72 hours.  Invalid input(s): FREET3  Other results:   Imaging    Ct Abdomen Pelvis Wo Contrast  Result Date: 12/12/2017 CLINICAL DATA:  Left-sided chest pain and  abdominal pain beginning this morning. EXAM: CT CHEST, ABDOMEN AND PELVIS WITHOUT CONTRAST TECHNIQUE: Multidetector CT imaging of the chest, abdomen and pelvis was performed following the standard protocol without IV contrast. COMPARISON:  Chest radiograph 12/12/2017. Chest, abdomen, and pelvis CT 06/25/2017. FINDINGS: CT CHEST FINDINGS Cardiovascular: Aortic and three-vessel coronary artery atherosclerosis. Prior CABG and aortic valve replacement. No aortic aneurysm. Mild cardiomegaly. No pericardial effusion. Mediastinum/Nodes: No enlarged axillary, mediastinal, or hilar lymph nodes. Unremarkable thyroid and esophagus. Lungs/Pleura: No pleural effusion or  pneumothorax. Unchanged pleural thickening and calcification in the lower left hemithorax consistent with history of talc pleurodesis. Unchanged 4 mm subpleural right upper lobe nodule (series 5, image 34). Mild scarring or atelectasis in both lung bases. Motion artifact mildly limits assessment of the lingula and basilar left lower lobe. There may be mild chronic centrilobular ground-glass nodularity in the left lower lobe, however if present this has not progressed from the prior CT. No consolidative opacities are seen. Musculoskeletal: No acute osseous abnormality or suspicious osseous lesion. Thoracic spondylosis. CT ABDOMEN PELVIS FINDINGS Hepatobiliary: Unchanged right hepatic lobe partial resection or atrophy. No focal liver abnormality identified. Prior cholecystectomy. No biliary dilatation. Pancreas: Mild fatty infiltration greatest in the head. No ductal dilatation or acute inflammation. Spleen: Unremarkable. Adrenals/Urinary Tract: Unremarkable adrenal glands. Asymmetric, advanced left renal atrophy is unchanged, as is an exophytic 4.8 x 4.0 cm low-density lesion extending posterior to the left kidney consistent with a cyst. Subcentimeter exophytic lesion off the lower pole of the right kidney is unchanged and too small to fully characterize. No renal calculi or hydronephrosis. Moderately distended bladder are. Stomach/Bowel: The stomach is within normal limits. Prior right hemicolectomy. There is left-sided colonic diverticulosis without evidence of diverticulitis. There is no evidence of bowel obstruction. Vascular/Lymphatic: Aortoiliac atherosclerosis without evidence of aneurysm. No enlarged lymph nodes. Reproductive: Unchanged prostate enlargement. Other: Bilateral fat containing inguinal hernias. The left inguinal hernia also contains a small portion of sigmoid colon without obstructive or acute inflammatory changes. Tiny fat containing ventral abdominal hernias superior to the umbilicus. No  intraperitoneal free fluid. Musculoskeletal: No acute osseous abnormality or suspicious osseous lesion. IMPRESSION: 1. No acute abnormality identified in the chest, abdomen, or pelvis. 2.  Aortic Atherosclerosis (ICD10-I70.0).  No aneurysm. 3. Unchanged inguinal hernias. Small amount of colon in the left-sided hernia without obstruction or acute inflammatory change. Electronically Signed   By: Logan Bores M.D.   On: 12/12/2017 19:31   Ct Chest Wo Contrast  Result Date: 12/12/2017 CLINICAL DATA:  Left-sided chest pain and abdominal pain beginning this morning. EXAM: CT CHEST, ABDOMEN AND PELVIS WITHOUT CONTRAST TECHNIQUE: Multidetector CT imaging of the chest, abdomen and pelvis was performed following the standard protocol without IV contrast. COMPARISON:  Chest radiograph 12/12/2017. Chest, abdomen, and pelvis CT 06/25/2017. FINDINGS: CT CHEST FINDINGS Cardiovascular: Aortic and three-vessel coronary artery atherosclerosis. Prior CABG and aortic valve replacement. No aortic aneurysm. Mild cardiomegaly. No pericardial effusion. Mediastinum/Nodes: No enlarged axillary, mediastinal, or hilar lymph nodes. Unremarkable thyroid and esophagus. Lungs/Pleura: No pleural effusion or pneumothorax. Unchanged pleural thickening and calcification in the lower left hemithorax consistent with history of talc pleurodesis. Unchanged 4 mm subpleural right upper lobe nodule (series 5, image 34). Mild scarring or atelectasis in both lung bases. Motion artifact mildly limits assessment of the lingula and basilar left lower lobe. There may be mild chronic centrilobular ground-glass nodularity in the left lower lobe, however if present this has not progressed from the prior CT. No consolidative opacities are seen. Musculoskeletal:  No acute osseous abnormality or suspicious osseous lesion. Thoracic spondylosis. CT ABDOMEN PELVIS FINDINGS Hepatobiliary: Unchanged right hepatic lobe partial resection or atrophy. No focal liver  abnormality identified. Prior cholecystectomy. No biliary dilatation. Pancreas: Mild fatty infiltration greatest in the head. No ductal dilatation or acute inflammation. Spleen: Unremarkable. Adrenals/Urinary Tract: Unremarkable adrenal glands. Asymmetric, advanced left renal atrophy is unchanged, as is an exophytic 4.8 x 4.0 cm low-density lesion extending posterior to the left kidney consistent with a cyst. Subcentimeter exophytic lesion off the lower pole of the right kidney is unchanged and too small to fully characterize. No renal calculi or hydronephrosis. Moderately distended bladder are. Stomach/Bowel: The stomach is within normal limits. Prior right hemicolectomy. There is left-sided colonic diverticulosis without evidence of diverticulitis. There is no evidence of bowel obstruction. Vascular/Lymphatic: Aortoiliac atherosclerosis without evidence of aneurysm. No enlarged lymph nodes. Reproductive: Unchanged prostate enlargement. Other: Bilateral fat containing inguinal hernias. The left inguinal hernia also contains a small portion of sigmoid colon without obstructive or acute inflammatory changes. Tiny fat containing ventral abdominal hernias superior to the umbilicus. No intraperitoneal free fluid. Musculoskeletal: No acute osseous abnormality or suspicious osseous lesion. IMPRESSION: 1. No acute abnormality identified in the chest, abdomen, or pelvis. 2.  Aortic Atherosclerosis (ICD10-I70.0).  No aneurysm. 3. Unchanged inguinal hernias. Small amount of colon in the left-sided hernia without obstruction or acute inflammatory change. Electronically Signed   By: Logan Bores M.D.   On: 12/12/2017 19:31   Nm Pulmonary Perf And Vent  Result Date: 12/12/2017 CLINICAL DATA:  Shortness of breath, history of hypertension, CHF, flash pulmonary edema, stroke, MI CT, colon cancer, diabetes mellitus, ischemic cardiomyopathy EXAM: NUCLEAR MEDICINE VENTILATION - PERFUSION LUNG SCAN TECHNIQUE: Ventilation images were  obtained in multiple projections using inhaled aerosol Tc-52m DTPA. Perfusion images were obtained in multiple projections after intravenous injection of Tc-25m MAA. RADIOPHARMACEUTICALS:  30 mCi Technetium-19m DTPA aerosol inhalation and 4 mCi Technetium-47m MAA IV COMPARISON:  None. FINDINGS: Ventilation: Mildly diminished ventilation LEFT upper lobe versus RIGHT. Enlargement of cardiac silhouette. No other ventilatory defects. Perfusion: Enlargement of cardiac silhouette. No other segmental or subsegmental perfusion defects. Chest radiograph: LEFT basilar opacity question pneumonia. Enlargement of cardiac silhouette post AVR. IMPRESSION: Very low probability for pulmonary embolism. Electronically Signed   By: Lavonia Dana M.D.   On: 12/12/2017 21:26   Dg Chest Port 1 View  Result Date: 12/12/2017 CLINICAL DATA:  Shortness of breath and chest pain EXAM: PORTABLE CHEST 1 VIEW COMPARISON:  October 08, 2017 FINDINGS: There is a focal area of ill-defined opacity in the left base, concerning for focal pneumonia. Lungs elsewhere are clear. Heart is enlarged with pulmonary vascularity within normal limits. Patient is status post aortic valve replacement and coronary artery/ internal mammary bypass grafting. No evident adenopathy. No bone lesions. There is aortic atherosclerosis. IMPRESSION: Ill-defined opacity left base concerning for focal pneumonia. Lungs elsewhere clear. Stable cardiomegaly. Postoperative change is noted. There is aortic atherosclerosis. Aortic Atherosclerosis (ICD10-I70.0). Followup PA and lateral chest radiographs recommended in 3-4 weeks following trial of antibiotic therapy to ensure resolution and exclude underlying malignancy. Electronically Signed   By: Lowella Grip III M.D.   On: 12/12/2017 13:51     Medications:     Scheduled Medications: . aspirin EC  81 mg Oral Daily  . azithromycin  500 mg Oral Q24H  . carvedilol  12.5 mg Oral BID WC  . ezetimibe  10 mg Oral QHS  .  furosemide  80 mg Intravenous BID  .  heparin  5,000 Units Subcutaneous Q8H  . hydrALAZINE  50 mg Oral TID  . insulin aspart  0-5 Units Subcutaneous QHS  . insulin aspart  0-9 Units Subcutaneous TID WC  . isosorbide mononitrate  120 mg Oral Daily  . latanoprost  1 drop Both Eyes QHS  . multivitamin with minerals  1 tablet Oral Daily  . pantoprazole  40 mg Oral Daily  . potassium chloride SA  20 mEq Oral Daily  . tamsulosin  0.4 mg Oral QPC supper     Infusions: . cefTRIAXone (ROCEPHIN)  IV       PRN Medications:  acetaminophen, gi cocktail, morphine injection, nitroGLYCERIN, ondansetron (ZOFRAN) IV  Patient Profile   [name] [name] is an89 y.o. male with a PMHx of CAD, CABG, bioprosthetic AVR, anemia, CKD stage 4, chronic systolic heart failure. He is not on an ACE or ARB due renal failure.   Brought in via EMS with recurrent CP, not relieved by NTG.   Assessment/Plan   1. Chest pain => Pleuritic  => ? Infectious process.  - Atypical. Affecting his left chest and ? Upper abdomen.  Not worse with palpation but worse with respirations.  - D-dimer 0.59. VQ scan negative.  - CXR showed LLL PNA but CT chest/abdomen/pelvis did not show PNA or other acute abnormality.   - Started on rocephin and azithromycin. 2. CAD: S/p CABG - Has multiple ED visits and admissions with CP. Troponin negative and usually improves with lasix.  - As previously discussed, with absence of definite MI, would avoid cardiac cath with CKD stage 4 and continue to treat symptomatically (therefore, will not do stress test). - Continue ASA 81 and Zetia 10 mg daily.  - No statin with myalgias. - Continue coreg 12.5 mg BID - Continue imdur 120 mg daily.  - Continue Sublingual NTG.  - Troponin flat without trend.  3. Aortic valve replacement: Bioprosthetic. - Valve looked stable on 7/18 echo. No change.  4. Chronic systolic CHF: Ischemic cardiomyopathy.  - EF 25-30% on 7/18 echo, down from EF 40-45%. -  He has had 2 doses of IV Lasix, volume status looks ok.  Can switch back to Lasix 80 mg po bid for this evening.  - Continue coreg 12.5 mg BID - Continue hydralazine 50 mg TID - Continue Imdur 120 mg daily.  - Not on ACEI because of CKD.  5. CKD stage IV:  - Makes volume management more difficult, and procludes from cath as would be very poor dialysis patient.  - Follow closely as above.  Labs pending today.  6. Delirium - Received morphine last night. Remains slightly confused this am.  7. Deconditioning - PT seeing and recommending rehab. Wife states he would not want this at all.  - He is DNR. May need to consider palliative care/hospice if not stable at home.   Medication concerns reviewed with patient and pharmacy team. Barriers identified: None at this time.  Length of Stay: 0  [name] [name]  12/13/2017, 8:06 AM  Advanced Heart Failure Team Pager [phone] (M-F; 7a - 4p)  Please contact [facility] for night-coverage after hours (4p -7a ) and weekends on [email or web address]  Patient seen with PA, agree with the above note.  He feels weak this morning and has had dysphagia with his pills.    Chest pain is improved.  I do not think it was cardiac.  No significant troponin elevation and pain was pleuritic.  No definite PNA on CT and no  PE on V/Q scan.    It is possible this event was related to transient volume overload as in the past.  He seems to have responded well to Lasix 80 mg IV, has had 2 doses.  He does not look volume overloaded now.  Can switch back to home po Lasix regimen.  Needs BMET today.   WBCs 16 today, reasonable to treat with course of abx for ?acute bronchitis.   [name] [name] 12/13/2017 9:05 AM

## 2017-12-13 NOTE — Evaluation (Signed)
Clinical/Bedside Swallow Evaluation Patient Details  Name: [name] [name] MRN: [id number] Date of Birth: 1926-11-17  Today's Date: 12/13/2017 Time: SLP Start Time (ACUTE ONLY): 1013 SLP Stop Time (ACUTE ONLY): 1041 SLP Time Calculation (min) (ACUTE ONLY): 28 min  Past Medical History:  Past Medical History:  Diagnosis Date  . Anemia   . Anxiety   . Aortic stenosis    a. s/p AVR with 67mm Edwards pericardial valve 02/07/12 - post-op course complicated by pleural effusion requring thoracentesis/leg cellulitis 03/2012.  . Baker's cyst 07/23/12   Incidental finding of LE venous dopplers  . Blood transfusion    NO REACTION TO TRANSFUSION  . CAD (coronary artery disease)    a. s/p NSTEMI 12/2011:  LHC - Ostial left main 20%, ostial LAD 50%, mid 60-70%, ostial D1 40% and mid 40%, D2 70%, ostial circumflex occluded, ostial RCA 80-90%, LVEDP was 42. b.  s/p CABG x 3 at time of AVR (LiMA-LAD, SVG-2nd daigonal, SVG-PDA) 02/07/12 (post-op course noted above).  . Cancer [facility]) '90's   Colon  . Cataract    right eye, hx of  . Cellulitis    a. RLE cellulitis 2 months post-operatively after AVR/CABG - serratia marcessans, tx with I&D/antibiotics  . CHF (congestive heart failure) ([place])   . Chronic kidney disease   . Chronic systolic heart failure ([place])    a. TEE 01/2012: EF 25-30%, diffuse hypokinesis. b. Not on ACEI due to renal insufficiency.;  c. follow up  echo 08/06/12: EF 25%, mod diast dysfxn, AVR ok, mild MR, mod LAE, mild RAE, mild to mod RV systolic dysfunction  . Diabetes mellitus    borderline  . Flash pulmonary edema ([place])    Post-cath 12/2011, went into acute pulm edema requiring IV lasix and intubation  . GERD (gastroesophageal reflux disease)   . History of colon cancer    s/p colon resection  . HLD (hyperlipidemia)   . HTN (hypertension)    primary, Dr. Hollace Kinnier  . Ischemic cardiomyopathy   . Mitral regurgitation    Moderate by TEE 01/2012  . Myocardial infarction ([place]) 12/2011   . Pleural effusion    a. post-operatively after AVR/CABG s/p thoracentesis 03/2012 yielding 1L serosanguinous fluid.  . Stroke ([place]) 10/01   RIght Leg weakness   Past Surgical History:  Past Surgical History:  Procedure Laterality Date  . AORTIC VALVE REPLACEMENT  02/07/2012   Procedure: AORTIC VALVE REPLACEMENT (AVR);  Surgeon: Gaye Pollack, MD;  Location: [place];  Service: Open Heart Surgery;  Laterality: N/A;  . CARDIAC CATHETERIZATION     1.3.13  stopped breathing, put on ventilator for 5-6 days  . CATARACT EXTRACTION     rt  . CHEST TUBE INSERTION  07/24/2012   Procedure: INSERTION PLEURAL DRAINAGE CATHETER;  Surgeon: Gaye Pollack, MD;  Location: [place];  Service: Thoracic;  Laterality: Left;  . COLON RESECTION  1996  . COLONOSCOPY    . CORONARY ARTERY BYPASS GRAFT  02/07/2012   Procedure: CORONARY ARTERY BYPASS GRAFTING (CABG);  Surgeon: Gaye Pollack, MD;  Location: [place];  Service: Open Heart Surgery;  Laterality: N/A;  CABG x three; using right leg greater saphenous vein harvested endoscopically  . ERCP N/A 07/08/2014   Procedure: ENDOSCOPIC RETROGRADE CHOLANGIOPANCREATOGRAPHY (ERCP);  Surgeon: Missy Sabins, MD;  Location: [facility] ENDOSCOPY;  Service: Endoscopy;  Laterality: N/A;  . ESOPHAGEAL DILATION    . ESOPHAGOGASTRODUODENOSCOPY N/A 07/03/2014   Procedure: ESOPHAGOGASTRODUODENOSCOPY (EGD);  Surgeon: Arta Silence, MD;  Location: [facility]  ENDOSCOPY;  Service: Endoscopy;  Laterality: N/A;  . ESOPHAGOSCOPY WITH DILITATION N/A 07/07/2014   Procedure: ESOPHAGOSCOPY WITH DILITATION/SAVARY DILATOR;  Surgeon: Rozetta Nunnery, MD;  Location: [facility] OR;  Service: ENT;  Laterality: N/A;  . EYE SURGERY  2009   cataract removed from right eye  . INGUINAL HERNIA REPAIR Right 09/13/2015   Procedure: OPEN RIGHT INGUINAL HERNIA;  Surgeon: Mickeal Skinner, MD;  Location: [place];  Service: General;  Laterality: Right;  . INSERTION OF MESH Right 09/13/2015   Procedure: INSERTION OF MESH;  Surgeon:  Mickeal Skinner, MD;  Location: [place];  Service: General;  Laterality: Right;  . LEFT HEART CATHETERIZATION WITH CORONARY ANGIOGRAM N/A 12/13/2011   Procedure: LEFT HEART CATHETERIZATION WITH CORONARY ANGIOGRAM;  Surgeon: Peter M Martinique, MD;  Location: [facility] CATH LAB;  Service: Cardiovascular;  Laterality: N/A;  . REMOVAL OF PLEURAL DRAINAGE CATHETER  09/27/2012   Procedure: REMOVAL OF PLEURAL DRAINAGE CATHETER;  Surgeon: Gaye Pollack, MD;  Location: [place];  Service: Thoracic;  Laterality: Left;  . TALC PLEURODESIS  08/30/2012   Procedure: Pietro Cassis;  Surgeon: Gaye Pollack, MD;  Location: [place];  Service: Thoracic;  Laterality: Left;  INSERTION OF TALC VIA LEFT PLEURX  . TALC PLEURODESIS  09/12/2012   Procedure: Pietro Cassis;  Surgeon: Gaye Pollack, MD;  Location: [place];  Service: Thoracic;  Laterality: Left;  . TONSILLECTOMY     HPI:  [name] [name] a 84 y.o.malewith medical history significant ofcoronary artery disease, dysphagia, esophageal stricture with dilation, CABG, bioprosthetic AVR, anemia, chronic kidney disease stage IV, chronic systolic heart failure and colon cancer presented today with complaints of sudden onset of chest pain. July 24th 2015 ERCP revealed "very tight cervical esophageal web and unable to traverse endoscope, therefore ERCP procedure aborted." EGD done under general anethesia for esophageal dilation. MBS 2016 incomplete epiglottic closure and backflow from cervical esophagus with aspirated thin and penetrated nectar; D2/thin recommended. BSE 10/24/15 regular/thin recommended. Chest Xray 1/2 concerning for pna however chest CT 12/12/17 no acute abnormality identified in the chest, abdomen, or pelvis.   Assessment / Plan / Recommendation Clinical Impression  Wife reluctant to swallow eval stating pt "has had problems swallowing for a long time." Pt presents with significant pharyngeal/esophageal dysphagia marked by explosive, prolonged coughing episode  following small sip water. Initially he appeared to tolerate 4 bites applesauce and after 1 minute exhibited strong and prolonged cough and majority if not all applesauce consumed was regurgitated. Discussed findings with wife and daughter present. Daughter reported pt consumed meal 2 days ago without difficulty and stated "he came in for chest pain". SLP educated and explained how acute illness and alter body's ability to compensate for co-morbidities. Recommend GI consult (family/pt in agreement) to assess esophagus and need for repeat esophageal dilation (has had several; last in 2015). Recommend continue NPO with ice chips for moisture only after oral care. Will follow up.      SLP Visit Diagnosis: Dysphagia, unspecified (R13.10)    Aspiration Risk  Severe aspiration risk    Diet Recommendation Ice chips PRN after oral care        Other  Recommendations Recommended Consults: Consider GI evaluation Oral Care Recommendations: Oral care QID   Follow up Recommendations (TBD)      Frequency and Duration min 1 x/week  2 weeks       Prognosis Prognosis for Safe Diet Advancement: Fair Barriers to Reach Goals: Severity of deficits  Swallow Study   General HPI: [name] [name] a 84 y.o.malewith medical history significant ofcoronary artery disease, dysphagia, esophageal stricture with dilation, CABG, bioprosthetic AVR, anemia, chronic kidney disease stage IV, chronic systolic heart failure and colon cancer presented today with complaints of sudden onset of chest pain. July 24th 2015 ERCP revealed "very tight cervical esophageal web and unable to traverse endoscope, therefore ERCP procedure aborted." EGD done under general anethesia for esophageal dilation. MBS 2016 incomplete epiglottic closure and backflow from cervical esophagus with aspirated thin and penetrated nectar; D2/thin recommended. BSE 10/24/15 regular/thin recommended. Chest Xray 1/2 concerning for pna however chest CT  12/12/17 no acute abnormality identified in the chest, abdomen, or pelvis. Type of Study: Bedside Swallow Evaluation Previous Swallow Assessment: (see HPI) Diet Prior to this Study: NPO Temperature Spikes Noted: Yes(slight) Respiratory Status: Room air History of Recent Intubation: No Behavior/Cognition: Alert;Cooperative;Pleasant mood;Requires cueing Oral Cavity Assessment: Within Functional Limits Oral Care Completed by SLP: No Oral Cavity - Dentition: Dentures, top(natural lower) Vision: Functional for self-feeding Self-Feeding Abilities: Able to feed self Patient Positioning: Upright in chair Baseline Vocal Quality: Low vocal intensity Volitional Cough: Strong Volitional Swallow: Able to elicit    Oral/Motor/Sensory Function Overall Oral Motor/Sensory Function: Within functional limits   Ice Chips Ice chips: Not tested   Thin Liquid Thin Liquid: Impaired Presentation: Cup Pharyngeal  Phase Impairments: Cough - Immediate;Multiple swallows    Nectar Thick Nectar Thick Liquid: Not tested   Honey Thick Honey Thick Liquid: Not tested   Puree Puree: Impaired Presentation: Self Fed;Spoon Pharyngeal Phase Impairments: Cough - Delayed;Multiple swallows(regurgitation)   Solid   GO   Solid: Not tested        Houston Siren 12/13/2017,11:35 AM  Orbie Pyo Colvin Caroli.Ed [employer] [phone]

## 2017-12-13 NOTE — Progress Notes (Signed)
  Echocardiogram 2D Echocardiogram has been performed.  [name] [name] 12/13/2017, 10:17 AM

## 2017-12-13 NOTE — Evaluation (Signed)
Physical Therapy Evaluation Patient Details Name: Shaft Corigliano MRN: [id number] DOB: 1926/08/11 Today's Date: 12/13/2017   History of Present Illness  Pt is an 84 y.o. male admitted 12/12/16 with c/o sudden chest pain and intermittent cough; determined to be likely pleuritic, and worked up for potential L-sided community-acquired bacterial pneumonia. Pertinent PMH includes CAD s/p CABG, biprosthetic AVR, anemia, CKD IV, CHF, colon cancer.    Clinical Impression  Pt presents with generalized weakness, decreased activity tolerance, and an overall decrease in functional mobility secondary to above. PTA, pt indep with mobility/ADLs and lives at home with wife; pt with increased confusion (wife suspects due to morphine) this morning, but wife able to explain pt very active walking at baseline. Today, pt required modA to stand and minA to maintain balance while walking with RW. Eval limited by arrival of lab for bloodwork. Recommend short-term SNF-level therapies at d/c to maximize functional mobility and independence prior to d/c home; wife reports bad experience with SNF, and that her and pt would prefer he return home. If this is the case, will need HHPT services. Will continue to follow acutely to address established goals.    Follow Up Recommendations SNF(pt refusing; will need HHPT)    Equipment Recommendations  Rolling walker with 5" wheels    Recommendations for Other Services OT consult     Precautions / Restrictions Precautions Precautions: Fall Restrictions Weight Bearing Restrictions: No      Mobility  Bed Mobility Overal bed mobility: Needs Assistance Bed Mobility: Supine to Sit     Supine to sit: Min assist     General bed mobility comments: MinA to assist trunk elevation  Transfers Overall transfer level: Needs assistance Equipment used: Rolling walker (2 wheeled);1 person hand held assist Transfers: Sit to/from Stand Sit to Stand: Mod assist         General  transfer comment: Pt unable to stand with single UE support, with uncontrolled descent back to EOB; modA to assist trunk elevation standing with RW  Ambulation/Gait Ambulation/Gait assistance: Min assist Ambulation Distance (Feet): 5 Feet Assistive device: Rolling walker (2 wheeled) Gait Pattern/deviations: Step-to pattern;Leaning posteriorly Gait velocity: Decreased Gait velocity interpretation: <1.8 ft/sec, indicative of risk for recurrent falls General Gait Details: MinA for balance walking from bed to [facility] with RW; bilat knee buckling noted, heavily reliant on BUEs to maintain upright. Pt attempting to sit before reaching chair, requiring max cues for safety  Stairs            Wheelchair Mobility    Modified Rankin (Stroke Patients Only)       Balance Overall balance assessment: Needs assistance   Sitting balance-Leahy Scale: Fair       Standing balance-Leahy Scale: Poor                               Pertinent Vitals/Pain Pain Assessment: No/denies pain    Home Living Family/patient expects to be discharged to:: Private residence Living Arrangements: Spouse/significant other Available Help at Discharge: Family;Available 24 hours/day Type of Home: Apartment Home Access: Level entry     Home Layout: One level Home Equipment: Cane - single point Additional Comments: Spouse available for 24/7 supervision, but unable to provide physical assistance    Prior Function Level of Independence: Independent               Hand Dominance        Extremity/Trunk Assessment   Upper Extremity Assessment  Upper Extremity Assessment: Overall WFL for tasks assessed    Lower Extremity Assessment Lower Extremity Assessment: Overall WFL for tasks assessed    Cervical / Trunk Assessment Cervical / Trunk Assessment: Kyphotic  Communication   Communication: Expressive difficulties(wife reports has been difficult to understand since morphine; not pt's  baseline)  Cognition Arousal/Alertness: Awake/alert Behavior During Therapy: Flat affect Overall Cognitive Status: Impaired/Different from baseline Area of Impairment: Orientation;Attention;Memory;Following commands;Safety/judgement;Awareness;Problem solving                 Orientation Level: Disoriented to;Time Current Attention Level: Sustained Memory: Decreased short-term memory Following Commands: Follows one step commands with increased time Safety/Judgement: Decreased awareness of safety Awareness: Intellectual Problem Solving: Slow processing;Decreased initiation;Requires verbal cues        General Comments General comments (skin integrity, edema, etc.): Wife present during session    Exercises     Assessment/Plan    PT Assessment Patient needs continued PT services  PT Problem List Decreased strength;Decreased activity tolerance;Decreased balance;Decreased mobility;Decreased cognition;Decreased knowledge of use of DME;Decreased safety awareness       PT Treatment Interventions DME instruction;Gait training;Functional mobility training;Therapeutic activities;Therapeutic exercise;Balance training;Patient/family education    PT Goals (Current goals can be found in the Care Plan section)  Acute Rehab PT Goals Patient Stated Goal: Return home PT Goal Formulation: With patient/family Time For Goal Achievement: 12/27/17 Potential to Achieve Goals: Good    Frequency Min 3X/week   Barriers to discharge Decreased caregiver support Wife unable to provide physical assist if needed    Co-evaluation               AM-PAC PT "6 Clicks" Daily Activity  Outcome Measure Difficulty turning over in bed (including adjusting bedclothes, sheets and blankets)?: A Little Difficulty moving from lying on back to sitting on the side of the bed? : Unable Difficulty sitting down on and standing up from a chair with arms (e.g., wheelchair, bedside commode, etc,.)?: Unable Help  needed moving to and from a bed to chair (including a wheelchair)?: A Little Help needed walking in hospital room?: A Little Help needed climbing 3-5 steps with a railing? : A Lot 6 Click Score: 13    End of Session   Activity Tolerance: Patient limited by fatigue Patient left: in chair;with call bell/phone within reach;Other (comment);with family/visitor present(with lab present for blood draw) Nurse Communication: Mobility status PT Visit Diagnosis: Other abnormalities of gait and mobility (R26.89);Muscle weakness (generalized) (M62.81)    Time: 9242-6834 PT Time Calculation (min) (ACUTE ONLY): 18 min   Charges:   PT Evaluation $PT Eval Moderate Complexity: 1 Mod     PT G Codes:       Mabeline Caras, PT, DPT Acute Rehab Services  Pager: [place] 12/13/2017, 8:35 AM

## 2017-12-13 NOTE — Progress Notes (Signed)
Pt's son-in-law would like to speak to MD. Pt states it is okay for MD to speak to him. His name is Tillman Sers at [phone]. MD notified.

## 2017-12-13 NOTE — Progress Notes (Signed)
Pt vomited again approximately 40 min after I gave Zofran IV. Pt denies any pain, not in respiratory distress. MD notified and ordered one time dose of Phenergan 12.5mg  IV. Will continue to monitor pt.

## 2017-12-13 NOTE — Consult Note (Signed)
   [facility] CM Inpatient Consult   12/13/2017  Mirko Tailor 1926/08/11 [id number]  Patient screened for [place] [facility] services had previously been outreached by [facility] Telephonic nurse .  Patient is an 84 yr old with and not limited to coronary artery disease, HX of CABG, chronic kidney disease, congestive HF.  Currently patient is not fully oriented.  Wife is refusing SNF as recommended by physical therapy, at this time.  Patient may be appropriate for Home First program through [place].   Will follow up with inpatient care management staff for progress and disposition.  Patient in the Medicare ACO. Primary care provider, [facility].  This office is listed and  general provides the post transition of care follow up.  For questions, please contact:  Natividad Brood, RN BSN [facility] Liaison  [phone] business mobile phone Toll free office [phone]

## 2017-12-13 NOTE — Progress Notes (Signed)
Pt choked while swallowing morning medication. Pt vomited immediately after; vomit was yellow and thick like mucous. Pt's wife refuses for pt to have a swallow eval. Pt's wife states pt has narrow esophagus and it has been stretched several times. MD notified.

## 2017-12-13 NOTE — Plan of Care (Signed)
Contacted by patient's PCP Dr. Hollace Kinnier requesting palliative care consultation. Dr. Mariea Clonts expresses concern about recurrent hospital admissions and both he and his wife's declining health. It appears patient has or is planning on receiving in home services. Please consider adding an additional referral to [facility] at the time of discharge from the hospital. Please call our team if you have any questions or if inpatient consultation is appropriate.  Lane Hacker, DO Palliative Medicine [phone]

## 2017-12-14 ENCOUNTER — Inpatient Hospital Stay (HOSPITAL_COMMUNITY): Payer: Medicare Other

## 2017-12-14 LAB — BASIC METABOLIC PANEL
Anion gap: 8 (ref 5–15)
BUN: 77 mg/dL — ABNORMAL HIGH (ref 6–20)
CALCIUM: 8.9 mg/dL (ref 8.9–10.3)
CO2: 34 mmol/L — ABNORMAL HIGH (ref 22–32)
Chloride: 101 mmol/L (ref 101–111)
Creatinine, Ser: 3.84 mg/dL — ABNORMAL HIGH (ref 0.61–1.24)
GFR calc Af Amer: 15 mL/min — ABNORMAL LOW (ref >60)
GFR, EST NON AFRICAN AMERICAN: 13 mL/min — AB (ref >60)
Glucose, Bld: 130 mg/dL — ABNORMAL HIGH (ref 65–99)
POTASSIUM: 4 mmol/L (ref 3.5–5.1)
SODIUM: 143 mmol/L (ref 135–145)

## 2017-12-14 LAB — GLUCOSE, CAPILLARY
GLUCOSE-CAPILLARY: 147 mg/dL — AB (ref 65–99)
Glucose-Capillary: 125 mg/dL — ABNORMAL HIGH (ref 65–99)
Glucose-Capillary: 135 mg/dL — ABNORMAL HIGH (ref 65–99)

## 2017-12-14 LAB — LEGIONELLA PNEUMOPHILA SEROGP 1 UR AG: L. PNEUMOPHILA SEROGP 1 UR AG: NEGATIVE

## 2017-12-14 MED ORDER — SODIUM CHLORIDE 0.9 % IV SOLN
INTRAVENOUS | Status: DC
  Administered 2017-12-14 – 2017-12-15 (×2): via INTRAVENOUS

## 2017-12-14 MED ORDER — METOPROLOL TARTRATE 5 MG/5ML IV SOLN
5.0000 mg | Freq: Four times a day (QID) | INTRAVENOUS | Status: DC
  Administered 2017-12-14 – 2017-12-16 (×3): 5 mg via INTRAVENOUS
  Filled 2017-12-14 (×4): qty 5

## 2017-12-14 MED ORDER — SODIUM CHLORIDE 0.9 % IV BOLUS (SEPSIS)
250.0000 mL | Freq: Once | INTRAVENOUS | Status: AC
  Administered 2017-12-14: 250 mL via INTRAVENOUS

## 2017-12-14 MED ORDER — PANTOPRAZOLE SODIUM 40 MG IV SOLR
40.0000 mg | INTRAVENOUS | Status: DC
  Administered 2017-12-14 – 2017-12-17 (×4): 40 mg via INTRAVENOUS
  Filled 2017-12-14 (×4): qty 40

## 2017-12-14 MED ORDER — DEXTROSE 5 % IV SOLN
250.0000 mg | INTRAVENOUS | Status: DC
  Administered 2017-12-14 – 2017-12-17 (×4): 250 mg via INTRAVENOUS
  Filled 2017-12-14 (×5): qty 250

## 2017-12-14 MED ORDER — HYDRALAZINE HCL 20 MG/ML IJ SOLN
5.0000 mg | INTRAMUSCULAR | Status: DC | PRN
  Administered 2017-12-15 – 2017-12-16 (×4): 5 mg via INTRAVENOUS
  Filled 2017-12-14 (×6): qty 1

## 2017-12-14 NOTE — Consult Note (Signed)
   [facility] CM Inpatient Consult   12/14/2017  [name] [name] 1926/05/10 [id number]   Follow up:  Came by to speak with wife regarding [facility] First program and [place] Management's role in the program follow up.  Met with patient, sitting up in chair and family members at the bedside.  They state that his wife has gone home for the day. Call placed to [phone] and left a HIPAA appropriate voicemail message requesting a return call to this Probation officer.   Natividad Brood, RN BSN [place] [facility] Liaison  [phone] business mobile phone Toll free office [phone]

## 2017-12-14 NOTE — Progress Notes (Signed)
Pt's daughter requesting for MD to come back to room, says pt is having another episode with his stomach. MD paged.

## 2017-12-14 NOTE — Progress Notes (Signed)
[facility] PROGRESS NOTE Subjective Patient and family note that he is doing better with ice chips.  Waiting for ENT eval.  Objective: Vital signs in last 24 hours: Temp:  [97.8 F (36.6 C)-98.5 F (36.9 C)] 98.5 F (36.9 C) (01/04 0500) Pulse Rate:  [64-74] 64 (01/04 1057) Resp:  [18] 18 (01/04 0500) BP: (113-156)/(46-68) 156/68 (01/04 1057) SpO2:  [92 %-95 %] 95 % (01/04 0500) Weight:  [68.1 kg (150 lb 1.6 oz)] 68.1 kg (150 lb 1.6 oz) (01/04 0500) Last BM Date: 12/11/17  Intake/Output from previous day: 01/03 0701 - 01/04 0700 In: 50 [IV Piggyback:50] Out: 600 [Urine:600] Intake/Output this shift: No intake/output data recorded.    Lab Results: Recent Labs    12/12/17 1335 12/13/17 0807  WBC 6.3 15.8*  HGB 11.4* 13.3  HCT 35.0* 40.3  PLT 139* 148*   BMET Recent Labs    12/12/17 1335 12/13/17 0807 12/14/17 0552  NA 138 140 143  K 4.1 4.2 4.0  CL 100* 96* 101  CO2 30 30 34*  CREATININE 2.88* 3.26* 3.84*   LFT No results for input(s): PROT, AST, ALT, ALKPHOS, BILITOT, BILIDIR, IBILI in the last 72 hours. PT/INR No results for input(s): LABPROT, INR in the last 72 hours. PANCREAS No results for input(s): LIPASE in the last 72 hours.       Studies/Results: Ct Abdomen Pelvis Wo Contrast  Result Date: 12/12/2017 CLINICAL DATA:  Left-sided chest pain and abdominal pain beginning this morning. EXAM: CT CHEST, ABDOMEN AND PELVIS WITHOUT CONTRAST TECHNIQUE: Multidetector CT imaging of the chest, abdomen and pelvis was performed following the standard protocol without IV contrast. COMPARISON:  Chest radiograph 12/12/2017. Chest, abdomen, and pelvis CT 06/25/2017. FINDINGS: CT CHEST FINDINGS Cardiovascular: Aortic and three-vessel coronary artery atherosclerosis. Prior CABG and aortic valve replacement. No aortic aneurysm. Mild cardiomegaly. No pericardial effusion. Mediastinum/Nodes: No enlarged axillary, mediastinal, or hilar lymph nodes. Unremarkable  thyroid and esophagus. Lungs/Pleura: No pleural effusion or pneumothorax. Unchanged pleural thickening and calcification in the lower left hemithorax consistent with history of talc pleurodesis. Unchanged 4 mm subpleural right upper lobe nodule (series 5, image 34). Mild scarring or atelectasis in both lung bases. Motion artifact mildly limits assessment of the lingula and basilar left lower lobe. There may be mild chronic centrilobular ground-glass nodularity in the left lower lobe, however if present this has not progressed from the prior CT. No consolidative opacities are seen. Musculoskeletal: No acute osseous abnormality or suspicious osseous lesion. Thoracic spondylosis. CT ABDOMEN PELVIS FINDINGS Hepatobiliary: Unchanged right hepatic lobe partial resection or atrophy. No focal liver abnormality identified. Prior cholecystectomy. No biliary dilatation. Pancreas: Mild fatty infiltration greatest in the head. No ductal dilatation or acute inflammation. Spleen: Unremarkable. Adrenals/Urinary Tract: Unremarkable adrenal glands. Asymmetric, advanced left renal atrophy is unchanged, as is an exophytic 4.8 x 4.0 cm low-density lesion extending posterior to the left kidney consistent with a cyst. Subcentimeter exophytic lesion off the lower pole of the right kidney is unchanged and too small to fully characterize. No renal calculi or hydronephrosis. Moderately distended bladder are. Stomach/Bowel: The stomach is within normal limits. Prior right hemicolectomy. There is left-sided colonic diverticulosis without evidence of diverticulitis. There is no evidence of bowel obstruction. Vascular/Lymphatic: Aortoiliac atherosclerosis without evidence of aneurysm. No enlarged lymph nodes. Reproductive: Unchanged prostate enlargement. Other: Bilateral fat containing inguinal hernias. The left inguinal hernia also contains a small portion of sigmoid colon without obstructive or acute inflammatory changes. Tiny fat containing  ventral abdominal hernias superior to  the umbilicus. No intraperitoneal free fluid. Musculoskeletal: No acute osseous abnormality or suspicious osseous lesion. IMPRESSION: 1. No acute abnormality identified in the chest, abdomen, or pelvis. 2.  Aortic Atherosclerosis (ICD10-I70.0).  No aneurysm. 3. Unchanged inguinal hernias. Small amount of colon in the left-sided hernia without obstruction or acute inflammatory change. Electronically Signed   By: Logan Bores M.D.   On: 12/12/2017 19:31   Ct Chest Wo Contrast  Result Date: 12/12/2017 CLINICAL DATA:  Left-sided chest pain and abdominal pain beginning this morning. EXAM: CT CHEST, ABDOMEN AND PELVIS WITHOUT CONTRAST TECHNIQUE: Multidetector CT imaging of the chest, abdomen and pelvis was performed following the standard protocol without IV contrast. COMPARISON:  Chest radiograph 12/12/2017. Chest, abdomen, and pelvis CT 06/25/2017. FINDINGS: CT CHEST FINDINGS Cardiovascular: Aortic and three-vessel coronary artery atherosclerosis. Prior CABG and aortic valve replacement. No aortic aneurysm. Mild cardiomegaly. No pericardial effusion. Mediastinum/Nodes: No enlarged axillary, mediastinal, or hilar lymph nodes. Unremarkable thyroid and esophagus. Lungs/Pleura: No pleural effusion or pneumothorax. Unchanged pleural thickening and calcification in the lower left hemithorax consistent with history of talc pleurodesis. Unchanged 4 mm subpleural right upper lobe nodule (series 5, image 34). Mild scarring or atelectasis in both lung bases. Motion artifact mildly limits assessment of the lingula and basilar left lower lobe. There may be mild chronic centrilobular ground-glass nodularity in the left lower lobe, however if present this has not progressed from the prior CT. No consolidative opacities are seen. Musculoskeletal: No acute osseous abnormality or suspicious osseous lesion. Thoracic spondylosis. CT ABDOMEN PELVIS FINDINGS Hepatobiliary: Unchanged right hepatic lobe  partial resection or atrophy. No focal liver abnormality identified. Prior cholecystectomy. No biliary dilatation. Pancreas: Mild fatty infiltration greatest in the head. No ductal dilatation or acute inflammation. Spleen: Unremarkable. Adrenals/Urinary Tract: Unremarkable adrenal glands. Asymmetric, advanced left renal atrophy is unchanged, as is an exophytic 4.8 x 4.0 cm low-density lesion extending posterior to the left kidney consistent with a cyst. Subcentimeter exophytic lesion off the lower pole of the right kidney is unchanged and too small to fully characterize. No renal calculi or hydronephrosis. Moderately distended bladder are. Stomach/Bowel: The stomach is within normal limits. Prior right hemicolectomy. There is left-sided colonic diverticulosis without evidence of diverticulitis. There is no evidence of bowel obstruction. Vascular/Lymphatic: Aortoiliac atherosclerosis without evidence of aneurysm. No enlarged lymph nodes. Reproductive: Unchanged prostate enlargement. Other: Bilateral fat containing inguinal hernias. The left inguinal hernia also contains a small portion of sigmoid colon without obstructive or acute inflammatory changes. Tiny fat containing ventral abdominal hernias superior to the umbilicus. No intraperitoneal free fluid. Musculoskeletal: No acute osseous abnormality or suspicious osseous lesion. IMPRESSION: 1. No acute abnormality identified in the chest, abdomen, or pelvis. 2.  Aortic Atherosclerosis (ICD10-I70.0).  No aneurysm. 3. Unchanged inguinal hernias. Small amount of colon in the left-sided hernia without obstruction or acute inflammatory change. Electronically Signed   By: Logan Bores M.D.   On: 12/12/2017 19:31   Nm Pulmonary Perf And Vent  Result Date: 12/12/2017 CLINICAL DATA:  Shortness of breath, history of hypertension, CHF, flash pulmonary edema, stroke, MI CT, colon cancer, diabetes mellitus, ischemic cardiomyopathy EXAM: NUCLEAR MEDICINE VENTILATION - PERFUSION  LUNG SCAN TECHNIQUE: Ventilation images were obtained in multiple projections using inhaled aerosol Tc-2m DTPA. Perfusion images were obtained in multiple projections after intravenous injection of Tc-33m MAA. RADIOPHARMACEUTICALS:  30 mCi Technetium-82m DTPA aerosol inhalation and 4 mCi Technetium-58m MAA IV COMPARISON:  None. FINDINGS: Ventilation: Mildly diminished ventilation LEFT upper lobe versus RIGHT. Enlargement of cardiac silhouette. No  other ventilatory defects. Perfusion: Enlargement of cardiac silhouette. No other segmental or subsegmental perfusion defects. Chest radiograph: LEFT basilar opacity question pneumonia. Enlargement of cardiac silhouette post AVR. IMPRESSION: Very low probability for pulmonary embolism. Electronically Signed   By: Lavonia Dana M.D.   On: 12/12/2017 21:26   Dg Chest Port 1 View  Result Date: 12/12/2017 CLINICAL DATA:  Shortness of breath and chest pain EXAM: PORTABLE CHEST 1 VIEW COMPARISON:  October 08, 2017 FINDINGS: There is a focal area of ill-defined opacity in the left base, concerning for focal pneumonia. Lungs elsewhere are clear. Heart is enlarged with pulmonary vascularity within normal limits. Patient is status post aortic valve replacement and coronary artery/ internal mammary bypass grafting. No evident adenopathy. No bone lesions. There is aortic atherosclerosis. IMPRESSION: Ill-defined opacity left base concerning for focal pneumonia. Lungs elsewhere clear. Stable cardiomegaly. Postoperative change is noted. There is aortic atherosclerosis. Aortic Atherosclerosis (ICD10-I70.0). Followup PA and lateral chest radiographs recommended in 3-4 weeks following trial of antibiotic therapy to ensure resolution and exclude underlying malignancy. Electronically Signed   By: Lowella Grip III M.D.   On: 12/12/2017 13:51    Medications: I have reviewed the patient's current medications.  Assessment:   1.  Oropharyngeal dysphagia.  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  Will wait for their evaluation.  We will be happy to help with this is felt to be needed.     Nancy Fetter 12/14/2017, 11:37 AM  This note was created using voice recognition software. Minor errors may Have occurred unintentionally.  Pager: [phone] If no answer or after hours call [phone]

## 2017-12-14 NOTE — Progress Notes (Signed)
Pt has previously c/o abd pain with no flatus. Previous CT findings of stool I the transverse through the descending colon. Follow-up Abd X-Ray revealed "Nonobstructed, nondistended bowel gas pattern.  No free air." Spoke with bedside RN who states that pt still experiencing Abd discomfort and that it has been 3-4 days since his last BM. Will order a tap water enema an continue to monitor.  Arby Barrette NP-C, AGPCNP-BC [facility] Pager [phone]

## 2017-12-14 NOTE — Progress Notes (Signed)
PROGRESS NOTE    [name] [name]  ASN:[id number] DOB: Sep 21, 1926 DOA: 12/12/2017 PCP: Gayland Curry, DO    Brief Narrative:  84 y.o. male with medical history significant of coronary artery disease, CABG, bioprosthetic AVR, anemia, chronic kidney disease stage IV, chronic systolic heart failure and colon cancer presented today with complaints of sudden onset of chest pain.  Patient states that he started having chest pain early this morning after he woke up, mostly left-sided, sharp in nature, intermittent, up to 7 out of 10 intensity with radiation to the whole chest and his abdomen.  He also complained of some shortness of breath.  Chest pain was noted with nitroglycerin.  Patient also complains of intermittent cough but no productive sputum, fever, nausea, vomiting, diarrhea, chest trauma.  Patient denied any recent weight gain or decreased urine output.    Assessment & Plan:   Active Problems:   Chronic combined systolic and diastolic heart failure, NYHA class 2 (HCC)   Chronic kidney disease (CKD), stage IV (severe) (HCC)   Diabetes mellitus type 2, controlled, with complications (HCC)   Chest pain   Community acquired pneumonia   Pneumonia  Non-cardiac Chest pain -Unrelieved by nitroglycerin -Suspect esophogeal in etiology. CT chest without evidence on PNA -On empiric antibiotics for PNA. -VQ without evidence of PE -2d echo with EF of 40-45% -Chart reviewed. Patient with hx of proximal esophageal strictures s/p prior dilation in 2015. In room, pt noted to have difficulty swallowing ice chip and attempted to "fish" ice chip out -Consulted GI. Per history, ENT had performed dilation in past given location of stricture. Have requested ENT consult for Dr. Lucia Gaskins per GI recs, pending  Probable left-sided community-acquired bacterial pneumonia -Continue Rocephin and Zithromax -Wean O2 as tolerated  Coronary artery disease status post CABG -History of recurrent chest pain with  multiple ED visits and admissions -Initial troponin negative.  Cycle troponins.  Cardiology following. -Continue aspirin, Coreg, Imdur, hydralazine as tolerated -Stable at this time  Chronic systolic CHF with ischemic cardiomyopathy with history of bioprosthetic AVR -EF of 25-30% on 7/18. -Clinically dehydrated on exam with worsening renal function in the setting of diuresis with poor PO intake -Will hold lasix and start gentle IVF hydration  CKD stage IV -Monitor renal function while on IV diuretics -Remains stable at this time  Diabetes mellitus type 2 -Hold oral meds. Sliding scale insulin. Hemoglobin A1c noted to be 6.1  DVT prophylaxis: Heparin subQ Code Status: DNR Family Communication: Pt in room, family at bedside, son-in law over phone Disposition Plan: Uncertain at this time  Consultants:   GI  ENT  Cardiology  Procedures:     Antimicrobials: Anti-infectives (From admission, onward)   Start     Dose/Rate Route Frequency Ordered Stop   12/13/17 0800  cefTRIAXone (ROCEPHIN) 1 g in dextrose 5 % 50 mL IVPB     1 g 100 mL/hr over 30 Minutes Intravenous Every 24 hours 12/12/17 1959 12/20/17 0759   12/13/17 0800  azithromycin (ZITHROMAX) tablet 500 mg     500 mg Oral Every 24 hours 12/12/17 1959 12/20/17 0759   12/12/17 1630  cefTRIAXone (ROCEPHIN) 1 g in dextrose 5 % 50 mL IVPB     1 g 100 mL/hr over 30 Minutes Intravenous  Once 12/12/17 1622 12/12/17 1736   12/12/17 1630  azithromycin (ZITHROMAX) tablet 500 mg     500 mg Oral  Once 12/12/17 1622 12/12/17 1705      Subjective: Reports having less nausea. Feeling  dehydrated  Objective: Vitals:   12/13/17 2019 12/14/17 0500 12/14/17 1057 12/14/17 1300  BP: (!) 124/47 (!) 143/46 (!) 156/68 (!) 166/56  Pulse: 71 65 64 (!) 59  Resp: 18 18  18   Temp:  98.5 F (36.9 C)  97.6 F (36.4 C)  TempSrc:  Oral  Oral  SpO2: 93% 95%  100%  Weight:  68.1 kg (150 lb 1.6 oz)    Height:        Intake/Output  Summary (Last 24 hours) at 12/14/2017 1412 Last data filed at 12/14/2017 1259 Gross per 24 hour  Intake 0 ml  Output 600 ml  Net -600 ml   Filed Weights   12/12/17 2057 12/13/17 0500 12/14/17 0500  Weight: 68.4 kg (150 lb 12.7 oz) 68.5 kg (151 lb 0.2 oz) 68.1 kg (150 lb 1.6 oz)    Examination: General exam: Awake, laying in bed, in nad Respiratory system: Normal respiratory effort, no wheezing Cardiovascular system: regular rate, s1, s2 Gastrointestinal system: Soft, nondistended, positive BS Central nervous system: CN2-12 grossly intact, strength intact Extremities: Perfused, no clubbing Skin: Normal skin turgor, no notable skin lesions seen Psychiatry: Mood normal // no visual hallucinations    Data Reviewed: I have personally reviewed following labs and imaging studies  CBC: Recent Labs  Lab 12/12/17 1335 12/13/17 0807  WBC 6.3 15.8*  NEUTROABS  --  13.5*  HGB 11.4* 13.3  HCT 35.0* 40.3  MCV 97.8 100.5*  PLT 139* 604*   Basic Metabolic Panel: Recent Labs  Lab 12/12/17 1335 12/13/17 0807 12/14/17 0552  NA 138 140 143  K 4.1 4.2 4.0  CL 100* 96* 101  CO2 30 30 34*  GLUCOSE 142* 140* 130*  BUN 68* 68* 77*  CREATININE 2.88* 3.26* 3.84*  CALCIUM 9.3 9.4 8.9   GFR: Estimated Creatinine Clearance: 12.6 mL/min (A) (by C-G formula based on SCr of 3.84 mg/dL (H)). Liver Function Tests: No results for input(s): AST, ALT, ALKPHOS, BILITOT, PROT, ALBUMIN in the last 168 hours. No results for input(s): LIPASE, AMYLASE in the last 168 hours. No results for input(s): AMMONIA in the last 168 hours. Coagulation Profile: No results for input(s): INR, PROTIME in the last 168 hours. Cardiac Enzymes: Recent Labs  Lab 12/12/17 1815 12/12/17 2344 12/13/17 0536  TROPONINI 0.03* 0.03* 0.04*   BNP (last 3 results) No results for input(s): PROBNP in the last 8760 hours. HbA1C: Recent Labs    12/13/17 0536  HGBA1C 6.1*   CBG: Recent Labs  Lab 12/13/17 1145  12/13/17 1635 12/13/17 2108 12/14/17 0722 12/14/17 1152  GLUCAP 174* 121* 148* 125* 147*   Lipid Profile: No results for input(s): CHOL, HDL, LDLCALC, TRIG, CHOLHDL, LDLDIRECT in the last 72 hours. Thyroid Function Tests: No results for input(s): TSH, T4TOTAL, FREET4, T3FREE, THYROIDAB in the last 72 hours. Anemia Panel: No results for input(s): VITAMINB12, FOLATE, FERRITIN, TIBC, IRON, RETICCTPCT in the last 72 hours. Sepsis Labs: No results for input(s): PROCALCITON, LATICACIDVEN in the last 168 hours.  Recent Results (from the past 240 hour(s))  Culture, blood (routine x 2)     Status: None (Preliminary result)   Collection Time: 12/12/17  6:15 PM  Result Value Ref Range Status   Specimen Description BLOOD BLOOD RIGHT FOREARM  Final   Special Requests   Final    BOTTLES DRAWN AEROBIC AND ANAEROBIC Blood Culture adequate volume   Culture NO GROWTH < 12 HOURS  Final   Report Status PENDING  Incomplete  Culture, blood (routine  x 2)     Status: None (Preliminary result)   Collection Time: 12/12/17  6:18 PM  Result Value Ref Range Status   Specimen Description BLOOD LEFT ANTECUBITAL  Final   Special Requests IN PEDIATRIC BOTTLE Blood Culture adequate volume  Final   Culture NO GROWTH < 12 HOURS  Final   Report Status PENDING  Incomplete     Radiology Studies: Ct Abdomen Pelvis Wo Contrast  Result Date: 12/12/2017 CLINICAL DATA:  Left-sided chest pain and abdominal pain beginning this morning. EXAM: CT CHEST, ABDOMEN AND PELVIS WITHOUT CONTRAST TECHNIQUE: Multidetector CT imaging of the chest, abdomen and pelvis was performed following the standard protocol without IV contrast. COMPARISON:  Chest radiograph 12/12/2017. Chest, abdomen, and pelvis CT 06/25/2017. FINDINGS: CT CHEST FINDINGS Cardiovascular: Aortic and three-vessel coronary artery atherosclerosis. Prior CABG and aortic valve replacement. No aortic aneurysm. Mild cardiomegaly. No pericardial effusion. Mediastinum/Nodes: No  enlarged axillary, mediastinal, or hilar lymph nodes. Unremarkable thyroid and esophagus. Lungs/Pleura: No pleural effusion or pneumothorax. Unchanged pleural thickening and calcification in the lower left hemithorax consistent with history of talc pleurodesis. Unchanged 4 mm subpleural right upper lobe nodule (series 5, image 34). Mild scarring or atelectasis in both lung bases. Motion artifact mildly limits assessment of the lingula and basilar left lower lobe. There may be mild chronic centrilobular ground-glass nodularity in the left lower lobe, however if present this has not progressed from the prior CT. No consolidative opacities are seen. Musculoskeletal: No acute osseous abnormality or suspicious osseous lesion. Thoracic spondylosis. CT ABDOMEN PELVIS FINDINGS Hepatobiliary: Unchanged right hepatic lobe partial resection or atrophy. No focal liver abnormality identified. Prior cholecystectomy. No biliary dilatation. Pancreas: Mild fatty infiltration greatest in the head. No ductal dilatation or acute inflammation. Spleen: Unremarkable. Adrenals/Urinary Tract: Unremarkable adrenal glands. Asymmetric, advanced left renal atrophy is unchanged, as is an exophytic 4.8 x 4.0 cm low-density lesion extending posterior to the left kidney consistent with a cyst. Subcentimeter exophytic lesion off the lower pole of the right kidney is unchanged and too small to fully characterize. No renal calculi or hydronephrosis. Moderately distended bladder are. Stomach/Bowel: The stomach is within normal limits. Prior right hemicolectomy. There is left-sided colonic diverticulosis without evidence of diverticulitis. There is no evidence of bowel obstruction. Vascular/Lymphatic: Aortoiliac atherosclerosis without evidence of aneurysm. No enlarged lymph nodes. Reproductive: Unchanged prostate enlargement. Other: Bilateral fat containing inguinal hernias. The left inguinal hernia also contains a small portion of sigmoid colon  without obstructive or acute inflammatory changes. Tiny fat containing ventral abdominal hernias superior to the umbilicus. No intraperitoneal free fluid. Musculoskeletal: No acute osseous abnormality or suspicious osseous lesion. IMPRESSION: 1. No acute abnormality identified in the chest, abdomen, or pelvis. 2.  Aortic Atherosclerosis (ICD10-I70.0).  No aneurysm. 3. Unchanged inguinal hernias. Small amount of colon in the left-sided hernia without obstruction or acute inflammatory change. Electronically Signed   By: Logan Bores M.D.   On: 12/12/2017 19:31   Ct Chest Wo Contrast  Result Date: 12/12/2017 CLINICAL DATA:  Left-sided chest pain and abdominal pain beginning this morning. EXAM: CT CHEST, ABDOMEN AND PELVIS WITHOUT CONTRAST TECHNIQUE: Multidetector CT imaging of the chest, abdomen and pelvis was performed following the standard protocol without IV contrast. COMPARISON:  Chest radiograph 12/12/2017. Chest, abdomen, and pelvis CT 06/25/2017. FINDINGS: CT CHEST FINDINGS Cardiovascular: Aortic and three-vessel coronary artery atherosclerosis. Prior CABG and aortic valve replacement. No aortic aneurysm. Mild cardiomegaly. No pericardial effusion. Mediastinum/Nodes: No enlarged axillary, mediastinal, or hilar lymph nodes. Unremarkable thyroid and esophagus.  Lungs/Pleura: No pleural effusion or pneumothorax. Unchanged pleural thickening and calcification in the lower left hemithorax consistent with history of talc pleurodesis. Unchanged 4 mm subpleural right upper lobe nodule (series 5, image 34). Mild scarring or atelectasis in both lung bases. Motion artifact mildly limits assessment of the lingula and basilar left lower lobe. There may be mild chronic centrilobular ground-glass nodularity in the left lower lobe, however if present this has not progressed from the prior CT. No consolidative opacities are seen. Musculoskeletal: No acute osseous abnormality or suspicious osseous lesion. Thoracic spondylosis.  CT ABDOMEN PELVIS FINDINGS Hepatobiliary: Unchanged right hepatic lobe partial resection or atrophy. No focal liver abnormality identified. Prior cholecystectomy. No biliary dilatation. Pancreas: Mild fatty infiltration greatest in the head. No ductal dilatation or acute inflammation. Spleen: Unremarkable. Adrenals/Urinary Tract: Unremarkable adrenal glands. Asymmetric, advanced left renal atrophy is unchanged, as is an exophytic 4.8 x 4.0 cm low-density lesion extending posterior to the left kidney consistent with a cyst. Subcentimeter exophytic lesion off the lower pole of the right kidney is unchanged and too small to fully characterize. No renal calculi or hydronephrosis. Moderately distended bladder are. Stomach/Bowel: The stomach is within normal limits. Prior right hemicolectomy. There is left-sided colonic diverticulosis without evidence of diverticulitis. There is no evidence of bowel obstruction. Vascular/Lymphatic: Aortoiliac atherosclerosis without evidence of aneurysm. No enlarged lymph nodes. Reproductive: Unchanged prostate enlargement. Other: Bilateral fat containing inguinal hernias. The left inguinal hernia also contains a small portion of sigmoid colon without obstructive or acute inflammatory changes. Tiny fat containing ventral abdominal hernias superior to the umbilicus. No intraperitoneal free fluid. Musculoskeletal: No acute osseous abnormality or suspicious osseous lesion. IMPRESSION: 1. No acute abnormality identified in the chest, abdomen, or pelvis. 2.  Aortic Atherosclerosis (ICD10-I70.0).  No aneurysm. 3. Unchanged inguinal hernias. Small amount of colon in the left-sided hernia without obstruction or acute inflammatory change. Electronically Signed   By: Logan Bores M.D.   On: 12/12/2017 19:31   Nm Pulmonary Perf And Vent  Result Date: 12/12/2017 CLINICAL DATA:  Shortness of breath, history of hypertension, CHF, flash pulmonary edema, stroke, MI CT, colon cancer, diabetes mellitus,  ischemic cardiomyopathy EXAM: NUCLEAR MEDICINE VENTILATION - PERFUSION LUNG SCAN TECHNIQUE: Ventilation images were obtained in multiple projections using inhaled aerosol Tc-53m DTPA. Perfusion images were obtained in multiple projections after intravenous injection of Tc-78m MAA. RADIOPHARMACEUTICALS:  30 mCi Technetium-81m DTPA aerosol inhalation and 4 mCi Technetium-81m MAA IV COMPARISON:  None. FINDINGS: Ventilation: Mildly diminished ventilation LEFT upper lobe versus RIGHT. Enlargement of cardiac silhouette. No other ventilatory defects. Perfusion: Enlargement of cardiac silhouette. No other segmental or subsegmental perfusion defects. Chest radiograph: LEFT basilar opacity question pneumonia. Enlargement of cardiac silhouette post AVR. IMPRESSION: Very low probability for pulmonary embolism. Electronically Signed   By: Lavonia Dana M.D.   On: 12/12/2017 21:26    Scheduled Meds: . aspirin EC  81 mg Oral Daily  . azithromycin  500 mg Oral Q24H  . carvedilol  12.5 mg Oral BID WC  . ezetimibe  10 mg Oral QHS  . heparin  5,000 Units Subcutaneous Q8H  . hydrALAZINE  50 mg Oral TID  . insulin aspart  0-5 Units Subcutaneous QHS  . insulin aspart  0-9 Units Subcutaneous TID WC  . isosorbide mononitrate  120 mg Oral Daily  . latanoprost  1 drop Both Eyes QHS  . multivitamin with minerals  1 tablet Oral Daily  . pantoprazole  40 mg Oral Daily  . tamsulosin  0.4 mg Oral  QPC supper   Continuous Infusions: . sodium chloride 50 mL/hr at 12/14/17 1059  . cefTRIAXone (ROCEPHIN)  IV Stopped (12/14/17 1144)     LOS: 1 day   Marylu Lund, MD [facility] Pager [phone]  If 7PM-7AM, please contact night-coverage [email or web address] Password [email or web address] 12/14/2017, 2:12 PM

## 2017-12-14 NOTE — Progress Notes (Signed)
Advanced Heart Failure Rounding Note  PCP:  Primary Cardiologist: Dr. Aundra Dubin   Subjective:    No further pain. Nauseated last night and received Zofran and Morphine.  Has been slightly "out of it" per wife.   Alert to person this am, but slow to respond with date or place. No obvious focal abnormalities at this time.   Creatinine 2.8 -> 3.2 -> 3.8.  Lasix on hold.   Feeling much better this am. No further chest pain.   Speech saw 12/13/16 for pharyngeal dysphagia marked by "explosive, prolonged coughing". GI consulted as well who recommended ENT consult and possible dilation.   Troponin flat without trend.  VQ scan 12/12/17 negative/"very low probability for PE" CT Chest/Abd/Pelvis 12/12/17 negative for any acute abnormality.  Objective:   Weight Range: 150 lb 1.6 oz (68.1 kg) Body mass index is 22.82 kg/m.   Vital Signs:   Temp:  [97.8 F (36.6 C)-98.5 F (36.9 C)] 98.5 F (36.9 C) (01/04 0500) Pulse Rate:  [65-74] 65 (01/04 0500) Resp:  [18] 18 (01/04 0500) BP: (113-143)/(46-49) 143/46 (01/04 0500) SpO2:  [92 %-95 %] 95 % (01/04 0500) Weight:  [150 lb 1.6 oz (68.1 kg)] 150 lb 1.6 oz (68.1 kg) (01/04 0500) Last BM Date: 12/11/17  Weight change: Filed Weights   12/12/17 2057 12/13/17 0500 12/14/17 0500  Weight: 150 lb 12.7 oz (68.4 kg) 151 lb 0.2 oz (68.5 kg) 150 lb 1.6 oz (68.1 kg)   Intake/Output:   Intake/Output Summary (Last 24 hours) at 12/14/2017 0829 Last data filed at 12/14/2017 0600 Gross per 24 hour  Intake 50 ml  Output 600 ml  Net -550 ml    Physical Exam    General: Elderly. Frail.  HEENT: Normal Neck: Supple. JVP 6-7 cm. Carotids 2+ bilat; no bruits. No thyromegaly or nodule noted. Cor: PMI nondisplaced. RRR, 2/6 SEM RUSB. Lungs: Mildly diminished basilar sounds.  Abdomen: Soft, non-tender, non-distended, no HSM. No bruits or masses. +BS  Extremities: No cyanosis, clubbing, or rash. R and LLE no edema.  Neuro: Alert & orientedx3, cranial nerves  grossly intact. moves all 4 extremities w/o difficulty. Affect pleasant   Telemetry   NSR 60-70s, personally reviewed.   EKG    No new tracing.   Labs    CBC Recent Labs    12/12/17 1335 12/13/17 0807  WBC 6.3 15.8*  NEUTROABS  --  13.5*  HGB 11.4* 13.3  HCT 35.0* 40.3  MCV 97.8 100.5*  PLT 139* 829*   Basic Metabolic Panel Recent Labs    12/13/17 0807 12/14/17 0552  NA 140 143  K 4.2 4.0  CL 96* 101  CO2 30 34*  GLUCOSE 140* 130*  BUN 68* 77*  CREATININE 3.26* 3.84*  CALCIUM 9.4 8.9   Liver Function Tests No results for input(s): AST, ALT, ALKPHOS, BILITOT, PROT, ALBUMIN in the last 72 hours. No results for input(s): LIPASE, AMYLASE in the last 72 hours. Cardiac Enzymes Recent Labs    12/12/17 1815 12/12/17 2344 12/13/17 0536  TROPONINI 0.03* 0.03* 0.04*    BNP: BNP (last 3 results) Recent Labs    07/05/17 1517 07/11/17 1734 12/12/17 1335  BNP 529.6* 281.4* 384.0*    ProBNP (last 3 results) No results for input(s): PROBNP in the last 8760 hours.   D-Dimer Recent Labs    12/12/17 1538  DDIMER 0.59*   Hemoglobin A1C Recent Labs    12/13/17 0536  HGBA1C 6.1*   Fasting Lipid Panel No results for  input(s): CHOL, HDL, LDLCALC, TRIG, CHOLHDL, LDLDIRECT in the last 72 hours. Thyroid Function Tests No results for input(s): TSH, T4TOTAL, T3FREE, THYROIDAB in the last 72 hours.  Invalid input(s): FREET3  Other results:   Imaging    No results found.  Medications:     Scheduled Medications: . aspirin EC  81 mg Oral Daily  . azithromycin  500 mg Oral Q24H  . carvedilol  12.5 mg Oral BID WC  . ezetimibe  10 mg Oral QHS  . heparin  5,000 Units Subcutaneous Q8H  . hydrALAZINE  50 mg Oral TID  . insulin aspart  0-5 Units Subcutaneous QHS  . insulin aspart  0-9 Units Subcutaneous TID WC  . isosorbide mononitrate  120 mg Oral Daily  . latanoprost  1 drop Both Eyes QHS  . multivitamin with minerals  1 tablet Oral Daily  .  pantoprazole  40 mg Oral Daily  . potassium chloride SA  20 mEq Oral Daily  . tamsulosin  0.4 mg Oral QPC supper    Infusions: . cefTRIAXone (ROCEPHIN)  IV Stopped (12/13/17 0907)    PRN Medications: acetaminophen, gi cocktail, morphine injection, nitroGLYCERIN, ondansetron (ZOFRAN) IV  Patient Profile   Mr Aleman is an56 y.o. male with a PMHx of CAD, CABG, bioprosthetic AVR, anemia, CKD stage 4, chronic systolic heart failure. He is not on an ACE or ARB due renal failure.   Brought in via EMS with recurrent CP, not relieved by NTG.   Assessment/Plan   1. Chest pain => Pleuritic  => ? Infectious process +/- Dysphagia - Atypical. Affecting his left chest and ? Upper abdomen.  Not worse with palpation but worse with respirations.  - D-dimer 0.59. VQ scan negative.  - CXR showed LLL PNA but CT chest/abdomen/pelvis did not show PNA or other acute abnormality.   - Started on rocephin and azithromycin. - Now questioning component of dysphagia, ?possible aspiration though infiltrate not seen on CT. GI following and ENT to see with history of same.  2. CAD: S/p CABG - Has multiple ED visits and admissions with CP. Troponin negative and usually improves with lasix.  - As previously discussed, with absence of definite MI, would avoid cardiac cath with CKD stage 4 and continue to treat symptomatically (therefore, will not do stress test). - Continue ASA 81 and Zetia 10 mg daily.  - No statin with myalgias. - Continue coreg 12.5 mg BID - Continue imdur 120 mg daily.  - Continue Sublingual NTG.  - Troponin flat without trend.  3. ARF on CKD stage IV:  - Will give back 250 cc this am.  Hold diuretics.  4. Aortic valve replacement: Bioprosthetic. - Valve looked stable on 7/18 echo. No change.  5. Chronic systolic CHF: Ischemic cardiomyopathy.  - EF 25-30% on 7/18 echo, down from EF 40-45%. - Hold lasix for now with ARF. Follow fluid status closely  - Continue coreg 12.5 mg BID -  Continue hydralazine 50 mg TID - Continue Imdur 120 mg daily.  - Not on ACEI because of CKD.  6. Delirium - Much improved.  7. Deconditioning - PT seeing and recommending rehab. Wife states he would not want this at all.  - He is DNR. May need to consider palliative care/hospice if not stable at home. No change.  8. Oropharyngeal dysphagia: Has had upper esophageal dilatation in the past by ENT. Speech saw 12/13/16 for pharyngeal dysphagia marked by "explosive, prolonged coughing". GI consulted as well who recommended ENT consult and  possible dilation.   Medication concerns reviewed with patient and pharmacy team. Barriers identified: None at this time.  Length of Stay: 1  Annamaria Helling  12/14/2017, 8:29 AM  Advanced Heart Failure Team Pager [phone] (M-F; 7a - 4p)  Please contact [facility] for night-coverage after hours (4p -7a ) and weekends on [email or web address]  Patient seen with PA, agree with the above note.  Breathing is back to normal.  Creatinine has risen to 3.8 from 2.8 baseline.  He is not volume overloaded on exam.  Initial cause of symptoms still not totally clear.  However, with speech evaluation, wonder if he could have aspirated causing his initial presenting symptoms.  He did not have a clear aspiration-related finding on CT chest, however.   - Hold Lasix and will give gentle IV fluid as above.   He will have ENT evaluation today, ?recurrent upper esophageal stricture.   I do think palliative care evaluation would be reasonable.   Loralie Champagne 12/14/2017 11:08 AM

## 2017-12-14 NOTE — Progress Notes (Signed)
  Speech Language Pathology Patient Details Name: [name] [name] MRN: [id number] DOB: [birth year] Today's Date: 12/14/2017 Time:  -       Checked chart and with RN on pt's status. Plan is ENT consult (then possible barium esophagram) which has not yet occurred. Pt NPO except ice chips. Will continue to follow.    Houston Siren 12/14/2017, 1:45 PM  Orbie Pyo Colvin Caroli.Ed [employer] [phone]

## 2017-12-14 NOTE — Treatment Plan (Signed)
Called by nursing this afternoon. Patient noted to have increased lower quadrant abd pain. No flatus. Patient seen at bedside with family present. Decreased BS on exam with abd that felt mildly distended. Recent CT abd reviewed with findings of stool in the transverse through descending colon. Have ordered follow up abd xray. Pending results. If continued constipation, then would give cathartic. If evidence of obstruction, then would consider surgical consultation. Follow up on abd xray. Plan discussed with patient and family at bedside. All are in agreement with plan.

## 2017-12-14 NOTE — Progress Notes (Signed)
Physical Therapy Treatment Patient Details Name: [name] [name] MRN: [id number] DOB: May 31, 1926 Today's Date: 12/14/2017    History of Present Illness Pt is an 84 y.o. male admitted 12/12/16 with c/o sudden chest pain and intermittent cough; determined to be likely pleuritic, and worked up for potential L-sided community-acquired bacterial pneumonia; chest CT 12/12/17 no acute abnormality identified in the chest, abdomen, or pelvis. Speech pathology eval 1/3 indicated significant oral pharyngeal dysphagia. Pertinent PMH includes CAD s/p CABG, biprosthetic AVR, anemia, CKD IV, CHF, colon cancer.   PT Comments    Pt progressing well with mobility. Cognition improved this session. Able to transfer and ambulate using RW, requiring intermittent min guard due to decreased balance and poor safety awareness. From a mobility perspective, feel pt will be safe to return home with use of RW and 24/7 supervision from wife, in addition to [facility] services (d/c recs updated). Will continue to follow acutely.   Follow Up Recommendations  Home health PT;Supervision for mobility/OOB     Equipment Recommendations  None recommended by PT(son owns RW)    Recommendations for Other Services OT consult     Precautions / Restrictions Precautions Precautions: Fall Restrictions Weight Bearing Restrictions: No    Mobility  Bed Mobility Overal bed mobility: Needs Assistance Bed Mobility: Supine to Sit     Supine to sit: Supervision        Transfers Overall transfer level: Needs assistance Equipment used: Rolling walker (2 wheeled) Transfers: Sit to/from Stand Sit to Stand: Min guard         General transfer comment: Min guard for safety, but no physical assist required  Ambulation/Gait Ambulation/Gait assistance: Min guard;Supervision Ambulation Distance (Feet): 300 Feet Assistive device: Rolling walker (2 wheeled) Gait Pattern/deviations: Step-through pattern;Decreased stride length;Trunk  flexed Gait velocity: Decreased Gait velocity interpretation: <1.8 ft/sec, indicative of risk for recurrent falls General Gait Details: Amb with RW, requiring intermittent min guard for cues to maintain proximity to RW and upright posture. Progressing to supervision for safety   Stairs            Wheelchair Mobility    Modified Rankin (Stroke Patients Only)       Balance Overall balance assessment: Needs assistance   Sitting balance-Leahy Scale: Fair       Standing balance-Leahy Scale: Poor                              Cognition Arousal/Alertness: Awake/alert Behavior During Therapy: WFL for tasks assessed/performed Overall Cognitive Status: Impaired/Different from baseline Area of Impairment: Orientation;Attention;Safety/judgement;Awareness;Problem solving                 Orientation Level: Disoriented to;Time Current Attention Level: Selective     Safety/Judgement: Decreased awareness of safety Awareness: Emergent Problem Solving: Requires verbal cues General Comments: Cognition much improved this session      Exercises      General Comments General comments (skin integrity, edema, etc.): Wife and children present during session      Pertinent Vitals/Pain Pain Assessment: No/denies pain    Home Living                      Prior Function            PT Goals (current goals can now be found in the care plan section) Acute Rehab PT Goals Patient Stated Goal: Return home PT Goal Formulation: With patient/family Time For Goal Achievement: 12/27/17 Potential to  Achieve Goals: Good Progress towards PT goals: Progressing toward goals    Frequency    Min 3X/week      PT Plan Discharge plan needs to be updated    Co-evaluation              AM-PAC PT "6 Clicks" Daily Activity  Outcome Measure  Difficulty turning over in bed (including adjusting bedclothes, sheets and blankets)?: A Little Difficulty moving  from lying on back to sitting on the side of the bed? : A Little Difficulty sitting down on and standing up from a chair with arms (e.g., wheelchair, bedside commode, etc,.)?: A Little Help needed moving to and from a bed to chair (including a wheelchair)?: A Little Help needed walking in hospital room?: A Little Help needed climbing 3-5 steps with a railing? : A Little 6 Click Score: 18    End of Session Equipment Utilized During Treatment: Gait belt Activity Tolerance: Patient tolerated treatment well Patient left: in chair;with call bell/phone within reach;with family/visitor present Nurse Communication: Mobility status PT Visit Diagnosis: Other abnormalities of gait and mobility (R26.89);Muscle weakness (generalized) (M62.81)     Time: 4332-9518 PT Time Calculation (min) (ACUTE ONLY): 18 min  Charges:  $Gait Training: 8-22 mins                    G Codes:      Mabeline Caras, PT, DPT Acute Rehab Services  Pager: [place] 12/14/2017, 10:16 AM

## 2017-12-15 LAB — GLUCOSE, CAPILLARY
GLUCOSE-CAPILLARY: 121 mg/dL — AB (ref 65–99)
Glucose-Capillary: 118 mg/dL — ABNORMAL HIGH (ref 65–99)
Glucose-Capillary: 133 mg/dL — ABNORMAL HIGH (ref 65–99)
Glucose-Capillary: 129 mg/dL — ABNORMAL HIGH (ref 65–99)

## 2017-12-15 LAB — BASIC METABOLIC PANEL
ANION GAP: 13 (ref 5–15)
BUN: 70 mg/dL — ABNORMAL HIGH (ref 6–20)
CALCIUM: 8.8 mg/dL — AB (ref 8.9–10.3)
CHLORIDE: 101 mmol/L (ref 101–111)
CO2: 29 mmol/L (ref 22–32)
Creatinine, Ser: 3.39 mg/dL — ABNORMAL HIGH (ref 0.61–1.24)
GFR calc non Af Amer: 15 mL/min — ABNORMAL LOW (ref >60)
GFR, EST AFRICAN AMERICAN: 17 mL/min — AB (ref >60)
Glucose, Bld: 123 mg/dL — ABNORMAL HIGH (ref 65–99)
Potassium: 3.6 mmol/L (ref 3.5–5.1)
Sodium: 143 mmol/L (ref 135–145)

## 2017-12-15 MED ORDER — DEXTROSE-NACL 5-0.9 % IV SOLN
INTRAVENOUS | Status: DC
  Administered 2017-12-15 – 2017-12-16 (×4): via INTRAVENOUS
  Administered 2017-12-17: 75 mL/h via INTRAVENOUS

## 2017-12-15 NOTE — Progress Notes (Signed)
PROGRESS NOTE    [name] [name]  NIO:[id number] DOB: 1926/04/30 DOA: 12/12/2017 PCP: Gayland Curry, DO    Brief Narrative:  84 y.o. male with medical history significant of coronary artery disease, CABG, bioprosthetic AVR, anemia, chronic kidney disease stage IV, chronic systolic heart failure and colon cancer presented today with complaints of sudden onset of chest pain.  Patient states that he started having chest pain early this morning after he woke up, mostly left-sided, sharp in nature, intermittent, up to 7 out of 10 intensity with radiation to the whole chest and his abdomen.  He also complained of some shortness of breath.  Chest pain was noted with nitroglycerin.  Patient also complains of intermittent cough but no productive sputum, fever, nausea, vomiting, diarrhea, chest trauma.  Patient denied any recent weight gain or decreased urine output.    Assessment & Plan:   Active Problems:   Chronic combined systolic and diastolic heart failure, NYHA class 2 (HCC)   Chronic kidney disease (CKD), stage IV (severe) (HCC)   Diabetes mellitus type 2, controlled, with complications (HCC)   Chest pain   Community acquired pneumonia   Pneumonia  Non-cardiac Chest pain -Unrelieved by nitroglycerin -Suspect esophogeal in etiology. CT chest without evidence on PNA -On empiric antibiotics for PNA. -VQ without evidence of PE -2d echo with EF of 40-45% -Chart reviewed. Patient with hx of proximal esophageal strictures s/p prior dilation in 2015. In room, pt noted to have difficulty swallowing ice chip and attempted to "fish" ice chip out -Consulted GI. Per history, ENT had performed dilation in past given location of stricture. -Consulted ENT, Dr. Lucia Gaskins. Appreciate input. Recs to f/u on barium swallow. Possible dilation if stricture seen  Probable left-sided community-acquired bacterial pneumonia -Continue Rocephin and Zithromax -Continue with O2 as tolerated  Coronary artery  disease status post CABG -History of recurrent chest pain with multiple ED visits and admissions -Initial troponin negative.  Cycle troponins.  Cardiology following. -Continue aspirin, Coreg, Imdur, hydralazine as tolerated -Remains stable at this time  Chronic systolic CHF with ischemic cardiomyopathy with history of bioprosthetic AVR -EF of 25-30% on 7/18. -Recently noted to be clinically dehydrated on exam with worsening renal function in the setting of diuresis with poor PO intake -Lasix on hold with gentle IVF hydration -Renal function improving  CKD stage IV -Monitor renal function while on IV diuretics -Cr improving with IVF  Diabetes mellitus type 2 -Holding oral meds. Sliding scale insulin. Hemoglobin A1c noted to be 6.1  HTN -BP suboptimally controlled -Given concerns of dysphagia, have transitioned to IV beta blocker with IV hydralazine PRN sbp>160  Lower quadrant abd pain -Abd xray reviewed. Findings suggestive of constipation -No result with enema overnight -Will give trial of soap suds enema  DVT prophylaxis: Heparin subQ Code Status: DNR Family Communication: Pt in room, family at bedside Disposition Plan: Uncertain at this time  Consultants:   GI  ENT  Cardiology  Procedures:     Antimicrobials: Anti-infectives (From admission, onward)   Start     Dose/Rate Route Frequency Ordered Stop   12/14/17 1630  azithromycin (ZITHROMAX) 250 mg in dextrose 5 % 125 mL IVPB    Comments:  Dysphagia. Cannot take pills   250 mg 125 mL/hr over 60 Minutes Intravenous Every 24 hours 12/14/17 1618     12/13/17 0800  cefTRIAXone (ROCEPHIN) 1 g in dextrose 5 % 50 mL IVPB     1 g 100 mL/hr over 30 Minutes Intravenous Every 24 hours 12/12/17  1959 12/20/17 0759   12/13/17 0800  azithromycin (ZITHROMAX) tablet 500 mg  Status:  Discontinued     500 mg Oral Every 24 hours 12/12/17 1959 12/14/17 1618   12/12/17 1630  cefTRIAXone (ROCEPHIN) 1 g in dextrose 5 % 50 mL IVPB      1 g 100 mL/hr over 30 Minutes Intravenous  Once 12/12/17 1622 12/12/17 1736   12/12/17 1630  azithromycin (ZITHROMAX) tablet 500 mg     500 mg Oral  Once 12/12/17 1622 12/12/17 1705      Subjective: No bowel movement overnight  Objective: Vitals:   12/15/17 1051 12/15/17 1126 12/15/17 1308 12/15/17 1340  BP: (!) 175/49 (!) 172/44 (!) 184/70 (!) 158/60  Pulse:  (!) 56    Resp:  16    Temp:  97.7 F (36.5 C)    TempSrc:  Oral    SpO2: 97% 97%    Weight:      Height:        Intake/Output Summary (Last 24 hours) at 12/15/2017 1551 Last data filed at 12/15/2017 1337 Gross per 24 hour  Intake 225 ml  Output 1200 ml  Net -975 ml   Filed Weights   12/13/17 0500 12/14/17 0500 12/15/17 0520  Weight: 68.5 kg (151 lb 0.2 oz) 68.1 kg (150 lb 1.6 oz) 69 kg (152 lb 1.9 oz)    Examination: General exam: Conversant, in no acute distress Respiratory system: normal chest rise, clear, no audible wheezing Cardiovascular system: regular rhythm, s1-s2 Gastrointestinal system: Nondistended, nontender, pos BS Central nervous system: No seizures, no tremors Extremities: No cyanosis, no joint deformities Skin: No rashes, no pallor Psychiatry: Affect normal // no auditory hallucinations   Data Reviewed: I have personally reviewed following labs and imaging studies  CBC: Recent Labs  Lab 12/12/17 1335 12/13/17 0807  WBC 6.3 15.8*  NEUTROABS  --  13.5*  HGB 11.4* 13.3  HCT 35.0* 40.3  MCV 97.8 100.5*  PLT 139* 086*   Basic Metabolic Panel: Recent Labs  Lab 12/12/17 1335 12/13/17 0807 12/14/17 0552 12/15/17 0519  NA 138 140 143 143  K 4.1 4.2 4.0 3.6  CL 100* 96* 101 101  CO2 30 30 34* 29  GLUCOSE 142* 140* 130* 123*  BUN 68* 68* 77* 70*  CREATININE 2.88* 3.26* 3.84* 3.39*  CALCIUM 9.3 9.4 8.9 8.8*   GFR: Estimated Creatinine Clearance: 14.3 mL/min (A) (by C-G formula based on SCr of 3.39 mg/dL (H)). Liver Function Tests: No results for input(s): AST, ALT, ALKPHOS,  BILITOT, PROT, ALBUMIN in the last 168 hours. No results for input(s): LIPASE, AMYLASE in the last 168 hours. No results for input(s): AMMONIA in the last 168 hours. Coagulation Profile: No results for input(s): INR, PROTIME in the last 168 hours. Cardiac Enzymes: Recent Labs  Lab 12/12/17 1815 12/12/17 2344 12/13/17 0536  TROPONINI 0.03* 0.03* 0.04*   BNP (last 3 results) No results for input(s): PROBNP in the last 8760 hours. HbA1C: Recent Labs    12/13/17 0536  HGBA1C 6.1*   CBG: Recent Labs  Lab 12/14/17 0722 12/14/17 1152 12/14/17 2121 12/15/17 0754 12/15/17 1154  GLUCAP 125* 147* 135* 118* 121*   Lipid Profile: No results for input(s): CHOL, HDL, LDLCALC, TRIG, CHOLHDL, LDLDIRECT in the last 72 hours. Thyroid Function Tests: No results for input(s): TSH, T4TOTAL, FREET4, T3FREE, THYROIDAB in the last 72 hours. Anemia Panel: No results for input(s): VITAMINB12, FOLATE, FERRITIN, TIBC, IRON, RETICCTPCT in the last 72 hours. Sepsis Labs: No results  for input(s): PROCALCITON, LATICACIDVEN in the last 168 hours.  Recent Results (from the past 240 hour(s))  Culture, blood (routine x 2)     Status: None (Preliminary result)   Collection Time: 12/12/17  6:15 PM  Result Value Ref Range Status   Specimen Description BLOOD BLOOD RIGHT FOREARM  Final   Special Requests   Final    BOTTLES DRAWN AEROBIC AND ANAEROBIC Blood Culture adequate volume   Culture NO GROWTH 3 DAYS  Final   Report Status PENDING  Incomplete  Culture, blood (routine x 2)     Status: None (Preliminary result)   Collection Time: 12/12/17  6:18 PM  Result Value Ref Range Status   Specimen Description BLOOD LEFT ANTECUBITAL  Final   Special Requests IN PEDIATRIC BOTTLE Blood Culture adequate volume  Final   Culture NO GROWTH 3 DAYS  Final   Report Status PENDING  Incomplete     Radiology Studies: Dg Abd Portable 1v  Result Date: 12/14/2017 CLINICAL DATA:  Small-bowel obstruction EXAM: PORTABLE  ABDOMEN - 1 VIEW COMPARISON:  CT from 12/12/2017 FINDINGS: Nonobstructed, nondistended bowel gas pattern without free air. Right lower quadrant surgical clips are present. Splenic arterial calcifications are noted in the left upper quadrant. There is lower lumbar spondylosis with lower lumbar facet arthropathy. No radio-opaque calculi or other significant radiographic abnormality are seen. IMPRESSION: 1. Nonobstructed, nondistended bowel gas pattern.  No free air. 2. Lumbar spondylosis with lower lumbar facet arthropathy. Electronically Signed   By: Ashley Royalty M.D.   On: 12/14/2017 20:31    Scheduled Meds: . aspirin EC  81 mg Oral Daily  . heparin  5,000 Units Subcutaneous Q8H  . insulin aspart  0-5 Units Subcutaneous QHS  . insulin aspart  0-9 Units Subcutaneous TID WC  . metoprolol tartrate  5 mg Intravenous Q6H  . multivitamin with minerals  1 tablet Oral Daily  . pantoprazole (PROTONIX) IV  40 mg Intravenous Q24H   Continuous Infusions: . azithromycin Stopped (12/14/17 1859)  . cefTRIAXone (ROCEPHIN)  IV Stopped (12/15/17 0900)  . dextrose 5 % and 0.9% NaCl 75 mL/hr at 12/15/17 1337     LOS: 2 days   Marylu Lund, MD [facility] Pager [phone]  If 7PM-7AM, please contact night-coverage [email or web address] Password [facility] 12/15/2017, 3:51 PM

## 2017-12-15 NOTE — Plan of Care (Signed)
  Safety: Ability to remain free from injury will improve 12/15/2017 0357 - Completed/Met by Evert Kohl, RN

## 2017-12-15 NOTE — Progress Notes (Signed)
Pt BP is running high, one dose hydralazine given this am, metoprolol is in hold(HR 54), paged MD and waiting for the response

## 2017-12-15 NOTE — Consult Note (Signed)
Reason for Consult: Dysphagia with history of esophageal stricture Referring Physician: Marylu Lund MD  [name] [name] is an 84 y.o. male.  HPI: Patient is presently admitted because of recent bouts of chest pain that radiated down to his abdomen.  He was admitted for a cardiac workup as he has history of coronary artery disease and congestive heart failure as well as type 2 diabetes.  He has had poor p.o. intake and has had difficulty swallowing along with the chest discomfort.  Chest discomfort was thought to be probably esophageal in origin versus cardiac.  Patient has had a history of upper esophageal stricture and was last dilated by myself little over 3 years ago.  I was consulted concerning upper esophageal stricture and possible dilation.  On discussion with family he has eaten recently at [facility] and was able to swallow.  However wife states that he has a lot of troubles in the morning with excessive mucus and globus type symptoms.  He also has these bouts of chest pain that radiated down into his abdomen.  Past Medical History:  Diagnosis Date  . Anemia   . Anxiety   . Aortic stenosis    a. s/p AVR with 44m Edwards pericardial valve 02/07/12 - post-op course complicated by pleural effusion requring thoracentesis/leg cellulitis 03/2012.  . Baker's cyst 07/23/12   Incidental finding of LE venous dopplers  . Blood transfusion    NO REACTION TO TRANSFUSION  . CAD (coronary artery disease)    a. s/p NSTEMI 12/2011:  LHC - Ostial left main 20%, ostial LAD 50%, mid 60-70%, ostial D1 40% and mid 40%, D2 70%, ostial circumflex occluded, ostial RCA 80-90%, LVEDP was 42. b.  s/p CABG x 3 at time of AVR (LiMA-LAD, SVG-2nd daigonal, SVG-PDA) 02/07/12 (post-op course noted above).  . Cancer ([facility] '90's   Colon  . Cataract    right eye, hx of  . Cellulitis    a. RLE cellulitis 2 months post-operatively after AVR/CABG - serratia marcessans, tx with I&D/antibiotics  . CHF (congestive heart  failure) ([facility]   . Chronic kidney disease   . Chronic systolic heart failure ([facility]    a. TEE 01/2012: EF 25-30%, diffuse hypokinesis. b. Not on ACEI due to renal insufficiency.;  c. follow up  echo 08/06/12: EF 25%, mod diast dysfxn, AVR ok, mild MR, mod LAE, mild RAE, mild to mod RV systolic dysfunction  . Diabetes mellitus    borderline  . Flash pulmonary edema ([facility]    Post-cath 12/2011, went into acute pulm edema requiring IV lasix and intubation  . GERD (gastroesophageal reflux disease)   . History of colon cancer    s/p colon resection  . HLD (hyperlipidemia)   . HTN (hypertension)    primary, Dr. THollace Kinnier . Ischemic cardiomyopathy   . Mitral regurgitation    Moderate by TEE 01/2012  . Myocardial infarction ([facility] 12/2011  . Pleural effusion    a. post-operatively after AVR/CABG s/p thoracentesis 03/2012 yielding 1L serosanguinous fluid.  . Stroke ([facility] 10/01   RIght Leg weakness    Past Surgical History:  Procedure Laterality Date  . AORTIC VALVE REPLACEMENT  02/07/2012   Procedure: AORTIC VALVE REPLACEMENT (AVR);  Surgeon: BGaye Pollack MD;  [place]  Service: Open Heart Surgery;  Laterality: N/A;  . CARDIAC CATHETERIZATION     1.3.13  stopped breathing, put on ventilator for 5-6 days  . CATARACT EXTRACTION     rt  . CHEST TUBE  INSERTION  07/24/2012   Procedure: INSERTION PLEURAL DRAINAGE CATHETER;  Surgeon: Gaye Pollack, MD;  Location: [place];  Service: Thoracic;  Laterality: Left;  . COLON RESECTION  1996  . COLONOSCOPY    . CORONARY ARTERY BYPASS GRAFT  02/07/2012   Procedure: CORONARY ARTERY BYPASS GRAFTING (CABG);  Surgeon: Gaye Pollack, MD;  Location: [place];  Service: Open Heart Surgery;  Laterality: N/A;  CABG x three; using right leg greater saphenous vein harvested endoscopically  . ERCP N/A 07/08/2014   Procedure: ENDOSCOPIC RETROGRADE CHOLANGIOPANCREATOGRAPHY (ERCP);  Surgeon: Missy Sabins, MD;  Location: [facility] ENDOSCOPY;  Service: Endoscopy;  Laterality:  N/A;  . ESOPHAGEAL DILATION    . ESOPHAGOGASTRODUODENOSCOPY N/A 07/03/2014   Procedure: ESOPHAGOGASTRODUODENOSCOPY (EGD);  Surgeon: Arta Silence, MD;  Location: [facility] ENDOSCOPY;  Service: Endoscopy;  Laterality: N/A;  . ESOPHAGOSCOPY WITH DILITATION N/A 07/07/2014   Procedure: ESOPHAGOSCOPY WITH DILITATION/SAVARY DILATOR;  Surgeon: Rozetta Nunnery, MD;  Location: [facility] OR;  Service: ENT;  Laterality: N/A;  . EYE SURGERY  2009   cataract removed from right eye  . INGUINAL HERNIA REPAIR Right 09/13/2015   Procedure: OPEN RIGHT INGUINAL HERNIA;  Surgeon: Mickeal Skinner, MD;  Location: [place];  Service: General;  Laterality: Right;  . INSERTION OF MESH Right 09/13/2015   Procedure: INSERTION OF MESH;  Surgeon: Mickeal Skinner, MD;  Location: [place];  Service: General;  Laterality: Right;  . LEFT HEART CATHETERIZATION WITH CORONARY ANGIOGRAM N/A 12/13/2011   Procedure: LEFT HEART CATHETERIZATION WITH CORONARY ANGIOGRAM;  Surgeon: Peter M Martinique, MD;  Location: [facility] CATH LAB;  Service: Cardiovascular;  Laterality: N/A;  . REMOVAL OF PLEURAL DRAINAGE CATHETER  09/27/2012   Procedure: REMOVAL OF PLEURAL DRAINAGE CATHETER;  Surgeon: Gaye Pollack, MD;  Location: [place];  Service: Thoracic;  Laterality: Left;  . TALC PLEURODESIS  08/30/2012   Procedure: Pietro Cassis;  Surgeon: Gaye Pollack, MD;  Location: [place];  Service: Thoracic;  Laterality: Left;  INSERTION OF TALC VIA LEFT PLEURX  . TALC PLEURODESIS  09/12/2012   Procedure: Pietro Cassis;  Surgeon: Gaye Pollack, MD;  Location: [place];  Service: Thoracic;  Laterality: Left;  . TONSILLECTOMY      Social History:  reports that  has never smoked. he has never used smokeless tobacco. He reports that he does not drink alcohol or use drugs.  Allergies:  Allergies  Allergen Reactions  . Statins Other (See Comments)    Pain/weakness in legs  . Ambien [Zolpidem Tartrate] Anxiety and Other (See Comments)    Anxiety and hallucinations      Medications: I have reviewed the patient's current medications.  Results for orders placed or performed during the hospital encounter of 12/12/17 (from the past 48 hour(s))  Glucose, capillary     Status: Abnormal   Collection Time: 12/13/17 11:45 AM  Result Value Ref Range   Glucose-Capillary 174 (H) 65 - 99 mg/dL   Comment 1 Notify RN    Comment 2 Document in Chart   Glucose, capillary     Status: Abnormal   Collection Time: 12/13/17  4:35 PM  Result Value Ref Range   Glucose-Capillary 121 (H) 65 - 99 mg/dL   Comment 1 Notify RN    Comment 2 Document in Chart   Glucose, capillary     Status: Abnormal   Collection Time: 12/13/17  9:08 PM  Result Value Ref Range   Glucose-Capillary 148 (H) 65 - 99 mg/dL   Comment  1 Notify RN    Comment 2 Document in Chart   Basic metabolic panel     Status: Abnormal   Collection Time: 12/14/17  5:52 AM  Result Value Ref Range   Sodium 143 135 - 145 mmol/L   Potassium 4.0 3.5 - 5.1 mmol/L   Chloride 101 101 - 111 mmol/L   CO2 34 (H) 22 - 32 mmol/L   Glucose, Bld 130 (H) 65 - 99 mg/dL   BUN 77 (H) 6 - 20 mg/dL   Creatinine, Ser 3.84 (H) 0.61 - 1.24 mg/dL   Calcium 8.9 8.9 - 10.3 mg/dL   GFR calc non Af Amer 13 (L) >60 mL/min   GFR calc Af Amer 15 (L) >60 mL/min    Comment: (NOTE) The eGFR has been calculated using the CKD EPI equation. This calculation has not been validated in all clinical situations. eGFR's persistently <60 mL/min signify possible Chronic Kidney Disease.    Anion gap 8 5 - 15  Glucose, capillary     Status: Abnormal   Collection Time: 12/14/17  7:22 AM  Result Value Ref Range   Glucose-Capillary 125 (H) 65 - 99 mg/dL   Comment 1 Document in Chart   Glucose, capillary     Status: Abnormal   Collection Time: 12/14/17 11:52 AM  Result Value Ref Range   Glucose-Capillary 147 (H) 65 - 99 mg/dL   Comment 1 Document in Chart   Glucose, capillary     Status: Abnormal   Collection Time: 12/14/17  9:21 PM  Result  Value Ref Range   Glucose-Capillary 135 (H) 65 - 99 mg/dL   Comment 1 Notify RN    Comment 2 Document in Chart   Basic metabolic panel     Status: Abnormal   Collection Time: 12/15/17  5:19 AM  Result Value Ref Range   Sodium 143 135 - 145 mmol/L   Potassium 3.6 3.5 - 5.1 mmol/L   Chloride 101 101 - 111 mmol/L   CO2 29 22 - 32 mmol/L   Glucose, Bld 123 (H) 65 - 99 mg/dL   BUN 70 (H) 6 - 20 mg/dL   Creatinine, Ser 3.39 (H) 0.61 - 1.24 mg/dL   Calcium 8.8 (L) 8.9 - 10.3 mg/dL   GFR calc non Af Amer 15 (L) >60 mL/min   GFR calc Af Amer 17 (L) >60 mL/min    Comment: (NOTE) The eGFR has been calculated using the CKD EPI equation. This calculation has not been validated in all clinical situations. eGFR's persistently <60 mL/min signify possible Chronic Kidney Disease.    Anion gap 13 5 - 15  Glucose, capillary     Status: Abnormal   Collection Time: 12/15/17  7:54 AM  Result Value Ref Range   Glucose-Capillary 118 (H) 65 - 99 mg/dL    Dg Abd Portable 1v  Result Date: 12/14/2017 CLINICAL DATA:  Small-bowel obstruction EXAM: PORTABLE ABDOMEN - 1 VIEW COMPARISON:  CT from 12/12/2017 FINDINGS: Nonobstructed, nondistended bowel gas pattern without free air. Right lower quadrant surgical clips are present. Splenic arterial calcifications are noted in the left upper quadrant. There is lower lumbar spondylosis with lower lumbar facet arthropathy. No radio-opaque calculi or other significant radiographic abnormality are seen. IMPRESSION: 1. Nonobstructed, nondistended bowel gas pattern.  No free air. 2. Lumbar spondylosis with lower lumbar facet arthropathy. Electronically Signed   By: Ashley Royalty M.D.   On: 12/14/2017 20:31    ROS:per HPI   PE: Patient is awake alert and  oriented x3. He has no airway problems. Neck exam: No palpable adenopathy.  No palpable thyroid mass noted.  Trachea midline.  Assessment/Plan: Chest pain questionable etiology.  Not felt to be cardiac in nature. History  of upper esophageal stricture status post dilation in 2015  Would recommend obtaining a barium swallow to evaluate status of stricture.  Depending on results of barium swallow consider repeat esophageal dilation.  Melony Overly 12/15/2017, 9:45 AM

## 2017-12-15 NOTE — Progress Notes (Signed)
     Dr Gentry Roch note reviewed, no additional recs today. Renal function improving off diuretics, continue to hold today. Further management per primary team, call over weekend with questions.   Merrily Pew, MD  12/15/2017, 9:39 AM

## 2017-12-15 NOTE — Progress Notes (Addendum)
Soap suds enema given, waiting for the result  3.52pm  Pt has a moderate amount of BM after the effect of soap suds enema

## 2017-12-15 NOTE — Progress Notes (Signed)
Patient received Tap H2O enema with 3 trials only tolerating approximately 175 cc each time. Expelled only liquid with smear of fecal mater. Passed some mucus. BS remains active with some hyper bowel sounds on left quad.

## 2017-12-16 ENCOUNTER — Inpatient Hospital Stay (HOSPITAL_COMMUNITY): Payer: Medicare Other

## 2017-12-16 DIAGNOSIS — R1310 Dysphagia, unspecified: Secondary | ICD-10-CM

## 2017-12-16 LAB — BASIC METABOLIC PANEL
Anion gap: 8 (ref 5–15)
BUN: 56 mg/dL — AB (ref 6–20)
CALCIUM: 8.6 mg/dL — AB (ref 8.9–10.3)
CO2: 28 mmol/L (ref 22–32)
Chloride: 105 mmol/L (ref 101–111)
Creatinine, Ser: 2.77 mg/dL — ABNORMAL HIGH (ref 0.61–1.24)
GFR calc Af Amer: 22 mL/min — ABNORMAL LOW (ref >60)
GFR, EST NON AFRICAN AMERICAN: 19 mL/min — AB (ref >60)
GLUCOSE: 156 mg/dL — AB (ref 65–99)
POTASSIUM: 3.3 mmol/L — AB (ref 3.5–5.1)
Sodium: 141 mmol/L (ref 135–145)

## 2017-12-16 LAB — GLUCOSE, CAPILLARY
GLUCOSE-CAPILLARY: 143 mg/dL — AB (ref 65–99)
GLUCOSE-CAPILLARY: 135 mg/dL — AB (ref 65–99)
GLUCOSE-CAPILLARY: 134 mg/dL — AB (ref 65–99)
Glucose-Capillary: 111 mg/dL — ABNORMAL HIGH (ref 65–99)

## 2017-12-16 MED ORDER — ASPIRIN 300 MG RE SUPP
150.0000 mg | Freq: Every day | RECTAL | Status: DC
  Administered 2017-12-17 – 2017-12-18 (×2): 150 mg via RECTAL
  Filled 2017-12-16 (×2): qty 1

## 2017-12-16 MED ORDER — POTASSIUM CHLORIDE 10 MEQ/100ML IV SOLN
10.0000 meq | INTRAVENOUS | Status: AC
  Administered 2017-12-16 (×4): 10 meq via INTRAVENOUS
  Filled 2017-12-16: qty 100

## 2017-12-16 MED ORDER — IOPAMIDOL (ISOVUE-300) INJECTION 61%
INTRAVENOUS | Status: AC
  Filled 2017-12-16: qty 50

## 2017-12-16 MED ORDER — HYDRALAZINE HCL 20 MG/ML IJ SOLN
5.0000 mg | Freq: Four times a day (QID) | INTRAMUSCULAR | Status: DC
  Administered 2017-12-16 – 2017-12-17 (×4): 5 mg via INTRAVENOUS
  Filled 2017-12-16 (×4): qty 1

## 2017-12-16 NOTE — Progress Notes (Signed)
PROGRESS NOTE    [name] [name]  JJK:[id number] DOB: May 09, 1926 DOA: 12/12/2017 PCP: Gayland Curry, DO    Brief Narrative:  84 y.o. male with medical history significant of coronary artery disease, CABG, bioprosthetic AVR, anemia, chronic kidney disease stage IV, chronic systolic heart failure and colon cancer presented today with complaints of sudden onset of chest pain.  Patient states that he started having chest pain early this morning after he woke up, mostly left-sided, sharp in nature, intermittent, up to 7 out of 10 intensity with radiation to the whole chest and his abdomen.  He also complained of some shortness of breath.  Chest pain was noted with nitroglycerin.  Patient also complains of intermittent cough but no productive sputum, fever, nausea, vomiting, diarrhea, chest trauma.  Patient denied any recent weight gain or decreased urine output.  Assessment & Plan:   Active Problems:   Chronic combined systolic and diastolic heart failure, NYHA class 2 (HCC)   Chronic kidney disease (CKD), stage IV (severe) (HCC)   Diabetes mellitus type 2, controlled, with complications (HCC)   Chest pain   Community acquired pneumonia   Pneumonia  Non-cardiac Chest pain -Unrelieved by nitroglycerin -Suspect esophogeal in etiology. CT chest without evidence on PNA -On empiric antibiotics for PNA. -VQ without evidence of PE -2d echo with EF of 40-45% -Chart reviewed. Patient with hx of proximal esophageal strictures s/p prior dilation in 2015. In room, pt noted to have difficulty swallowing ice chip and attempted to "fish" ice chip out -Consulted GI. Per history, ENT had performed dilation in past given location of stricture. -Consulted ENT, Dr. Lucia Gaskins. Appreciate input. Recs to f/u on barium swallow. See below. -follow up per GI/ENT recommendation  Aspiration Pneumonia -Continue Rocephin and Zithromax -Continue with O2 as tolerated -Patient with witnessed aspiration of contrast into  airway per Radiology during barium swallow which was stopped prematurely secondary to aspiration -WBC increased to just under 16k, concerns for continued aspiration -SLP following -Cont above abx -Strict NPO until patient can be cleared for diet -Discussed possibility of tube feeding. Family will consider, although tube feeding will not negate risk for aspiration  Coronary artery disease status post CABG -History of recurrent chest pain with multiple ED visits and admissions -Initial troponin negative.  Cycle troponins.  Cardiology following. -Continue aspirin, Coreg, Imdur, hydralazine as tolerated -Currently stable at this time  Chronic systolic CHF with ischemic cardiomyopathy with history of bioprosthetic AVR -EF of 25-30% on 7/18. -Recently noted to be clinically dehydrated on exam with worsening renal function in the setting of diuresis with poor PO intake -Lasix on hold with gentle IVF hydration -Renal function improved with IVF  CKD stage IV -Monitor renal function while on IV diuretics -Cr improving with IVF  Diabetes mellitus type 2 -Holding oral meds. Sliding scale insulin. Hemoglobin A1c noted to be 6.1  HTN -BP suboptimally controlled -Given concerns of dysphagia, have transitioned to IV beta blocker with IV hydralazine PRN sbp>160 -BP poorly controlled. Will start scheduled IV hydralazine -Given poor renal function, cannot give ARB or ACEI. Given relative bradycardia, will avoid clonidine  Lower quadrant abd pain -Abd xray reviewed. Findings suggestive of constipation -Good results with soap suds enema  DVT prophylaxis: Heparin subQ Code Status: DNR Family Communication: Pt in room, family at bedside Disposition Plan: Uncertain at this time  Consultants:   GI  ENT  Cardiology  Procedures:     Antimicrobials: Anti-infectives (From admission, onward)   Start     Dose/Rate Route  Frequency Ordered Stop   12/14/17 1630  azithromycin (ZITHROMAX) 250  mg in dextrose 5 % 125 mL IVPB    Comments:  Dysphagia. Cannot take pills   250 mg 125 mL/hr over 60 Minutes Intravenous Every 24 hours 12/14/17 1618     12/13/17 0800  cefTRIAXone (ROCEPHIN) 1 g in dextrose 5 % 50 mL IVPB     1 g 100 mL/hr over 30 Minutes Intravenous Every 24 hours 12/12/17 1959 12/20/17 0759   12/13/17 0800  azithromycin (ZITHROMAX) tablet 500 mg  Status:  Discontinued     500 mg Oral Every 24 hours 12/12/17 1959 12/14/17 1618   12/12/17 1630  cefTRIAXone (ROCEPHIN) 1 g in dextrose 5 % 50 mL IVPB     1 g 100 mL/hr over 30 Minutes Intravenous  Once 12/12/17 1622 12/12/17 1736   12/12/17 1630  azithromycin (ZITHROMAX) tablet 500 mg     500 mg Oral  Once 12/12/17 1622 12/12/17 1705      Subjective: Moderate BM overnight with enema  Objective: Vitals:   12/16/17 0800 12/16/17 1136 12/16/17 1230 12/16/17 1346  BP: (!) 155/63 (!) 199/63 (!) 184/60 (!) 185/54  Pulse:  (!) 51 (!) 54 (!) 54  Resp:  18    Temp: 98 F (36.7 C) 98.5 F (36.9 C)    TempSrc: Oral Oral    SpO2: 94% 100%    Weight:      Height:        Intake/Output Summary (Last 24 hours) at 12/16/2017 1438 Last data filed at 12/16/2017 1057 Gross per 24 hour  Intake 1320 ml  Output 1075 ml  Net 245 ml   Filed Weights   12/14/17 0500 12/15/17 0520 12/16/17 0704  Weight: 68.1 kg (150 lb 1.6 oz) 69 kg (152 lb 1.9 oz) 68.1 kg (150 lb 1.6 oz)    Examination: General exam: Awake, laying in bed, in nad Respiratory system: Normal respiratory effort, no wheezing Cardiovascular system: regular rate, s1, s2 Gastrointestinal system: Soft, nondistended, positive BS Central nervous system: CN2-12 grossly intact, strength intact Extremities: Perfused, no clubbing Skin: Normal skin turgor, no notable skin lesions seen Psychiatry: Mood normal // no visual hallucinations    Data Reviewed: I have personally reviewed following labs and imaging studies  CBC: Recent Labs  Lab 12/12/17 1335 12/13/17 0807    WBC 6.3 15.8*  NEUTROABS  --  13.5*  HGB 11.4* 13.3  HCT 35.0* 40.3  MCV 97.8 100.5*  PLT 139* 244*   Basic Metabolic Panel: Recent Labs  Lab 12/12/17 1335 12/13/17 0807 12/14/17 0552 12/15/17 0519 12/16/17 0505  NA 138 140 143 143 141  K 4.1 4.2 4.0 3.6 3.3*  CL 100* 96* 101 101 105  CO2 30 30 34* 29 28  GLUCOSE 142* 140* 130* 123* 156*  BUN 68* 68* 77* 70* 56*  CREATININE 2.88* 3.26* 3.84* 3.39* 2.77*  CALCIUM 9.3 9.4 8.9 8.8* 8.6*   GFR: Estimated Creatinine Clearance: 17.4 mL/min (A) (by C-G formula based on SCr of 2.77 mg/dL (H)). Liver Function Tests: No results for input(s): AST, ALT, ALKPHOS, BILITOT, PROT, ALBUMIN in the last 168 hours. No results for input(s): LIPASE, AMYLASE in the last 168 hours. No results for input(s): AMMONIA in the last 168 hours. Coagulation Profile: No results for input(s): INR, PROTIME in the last 168 hours. Cardiac Enzymes: Recent Labs  Lab 12/12/17 1815 12/12/17 2344 12/13/17 0536  TROPONINI 0.03* 0.03* 0.04*   BNP (last 3 results) No results for input(s):  PROBNP in the last 8760 hours. HbA1C: No results for input(s): HGBA1C in the last 72 hours. CBG: Recent Labs  Lab 12/15/17 1154 12/15/17 1704 12/15/17 2048 12/16/17 0741 12/16/17 1139  GLUCAP 121* 133* 129* 143* 111*   Lipid Profile: No results for input(s): CHOL, HDL, LDLCALC, TRIG, CHOLHDL, LDLDIRECT in the last 72 hours. Thyroid Function Tests: No results for input(s): TSH, T4TOTAL, FREET4, T3FREE, THYROIDAB in the last 72 hours. Anemia Panel: No results for input(s): VITAMINB12, FOLATE, FERRITIN, TIBC, IRON, RETICCTPCT in the last 72 hours. Sepsis Labs: No results for input(s): PROCALCITON, LATICACIDVEN in the last 168 hours.  Recent Results (from the past 240 hour(s))  Culture, blood (routine x 2)     Status: None (Preliminary result)   Collection Time: 12/12/17  6:15 PM  Result Value Ref Range Status   Specimen Description BLOOD BLOOD RIGHT FOREARM   Final   Special Requests   Final    BOTTLES DRAWN AEROBIC AND ANAEROBIC Blood Culture adequate volume   Culture NO GROWTH 4 DAYS  Final   Report Status PENDING  Incomplete  Culture, blood (routine x 2)     Status: None (Preliminary result)   Collection Time: 12/12/17  6:18 PM  Result Value Ref Range Status   Specimen Description BLOOD LEFT ANTECUBITAL  Final   Special Requests IN PEDIATRIC BOTTLE Blood Culture adequate volume  Final   Culture NO GROWTH 4 DAYS  Final   Report Status PENDING  Incomplete     Radiology Studies: Dg Esophagus  Result Date: 12/16/2017 CLINICAL DATA:  Dysphasia, history of proximal esophageal stricture, difficulty swallowing ice chips EXAM: ESOPHOGRAM/BARIUM SWALLOW TECHNIQUE: Single contrast examination was performed using  thin barium. FLUOROSCOPY TIME:  Fluoroscopy Time:  1 minutes 6 seconds Radiation Exposure Index (if provided by the fluoroscopic device): 18.2 mGy Number of Acquired Spot Images: 7 COMPARISON:  None. FINDINGS: Due to patient condition and inability to stand, upright and prone evaluation could not be performed. As such, only single-contrast supine evaluation was performed. No fixed narrowing or stricture of the esophagus. Note: if there is concern for upper cervical web, this would need to be excluded on a lateral view. As the patient was unable to cooperate with an upright evaluation, this could not be performed. However, this may be possible during a speech pathology evaluation in a seated position. Study was prematurely aborted due to tracheobronchial aspiration into the left mainstem bronchus and the left lower lobe. The aspiration elicited a cough reflex. IMPRESSION: Limited evaluation, due to the patient's inability to stand, as well as the study being prematurely aborted. Tracheobronchial aspiration, as described above. Consider speech pathology evaluation as clinically warranted. No fixed narrowing or stricture of the esophagus. However, an upper  cervical web would not have been detected during this study. This could be further assessed at time of speech pathology evaluation, if desired. These results were called by telephone at the time of interpretation on 12/16/2017 at 10:35 am to Dr. Marylu Lund , who verbally acknowledged these results. Electronically Signed   By: Julian Hy M.D.   On: 12/16/2017 10:43   Dg Abd Portable 1v  Result Date: 12/14/2017 CLINICAL DATA:  Small-bowel obstruction EXAM: PORTABLE ABDOMEN - 1 VIEW COMPARISON:  CT from 12/12/2017 FINDINGS: Nonobstructed, nondistended bowel gas pattern without free air. Right lower quadrant surgical clips are present. Splenic arterial calcifications are noted in the left upper quadrant. There is lower lumbar spondylosis with lower lumbar facet arthropathy. No radio-opaque calculi or other significant  radiographic abnormality are seen. IMPRESSION: 1. Nonobstructed, nondistended bowel gas pattern.  No free air. 2. Lumbar spondylosis with lower lumbar facet arthropathy. Electronically Signed   By: Ashley Royalty M.D.   On: 12/14/2017 20:31    Scheduled Meds: . aspirin EC  81 mg Oral Daily  . heparin  5,000 Units Subcutaneous Q8H  . hydrALAZINE  5 mg Intravenous Q6H  . insulin aspart  0-5 Units Subcutaneous QHS  . insulin aspart  0-9 Units Subcutaneous TID WC  . metoprolol tartrate  5 mg Intravenous Q6H  . multivitamin with minerals  1 tablet Oral Daily  . pantoprazole (PROTONIX) IV  40 mg Intravenous Q24H   Continuous Infusions: . azithromycin Stopped (12/15/17 1830)  . cefTRIAXone (ROCEPHIN)  IV Stopped (12/16/17 0902)  . dextrose 5 % and 0.9% NaCl 75 mL/hr at 12/16/17 0713     LOS: 3 days   Marylu Lund, MD [facility] Pager [phone]  If 7PM-7AM, please contact night-coverage [email or web address] Password [facility] 12/16/2017, 2:38 PM

## 2017-12-16 NOTE — Progress Notes (Signed)
Pt's BP is constantly high. Paged MD about the situation. Scheduled hydralazine added besides PRN, will continue to monitor

## 2017-12-16 NOTE — Progress Notes (Signed)
Throughout the day BP has been high, at the end of the shift it is 162/47, MD aware, rapid nurse called earlier just for a second opinion

## 2017-12-16 NOTE — Progress Notes (Signed)
Pt has gone through barium swallow test this am,  IV fluid continue @D5 , MD in bed side . Family members updating, vitals stable, no any complain of pain, will continue to monitor the patient, K-ride is in progress, first one is running out of four.  Palma Holter, RN

## 2017-12-16 NOTE — Evaluation (Signed)
Occupational Therapy Evaluation Patient Details Name: [name] [name] MRN: [id number] DOB: [birth year] Today's Date: 12/16/2017    History of Present Illness Pt is an 84 y.o. male admitted 12/12/16 with c/o sudden chest pain and intermittent cough; determined to be likely pleuritic, and worked up for potential L-sided community-acquired bacterial pneumonia; chest CT 12/12/17 no acute abnormality identified in the chest, abdomen, or pelvis. Speech pathology eval 1/3 indicated significant oral pharyngeal dysphagia. Pertinent PMH includes CAD s/p CABG, biprosthetic AVR, anemia, CKD IV, CHF, colon cancer.   Clinical Impression   Pt admitted with the above diagnoses and presents with below problem list. Pt will benefit from continued acute OT to address the below listed deficits and maximize independence with basic ADLs prior to d/c home. PTA pt was independent with ADLs. Pt is currently min guard to min A with OOB/LB ADLs nad functional mobility/transfers. Spouse and son-in-law present and involved during session.      Follow Up Recommendations  Home health OT;Supervision/Assistance - 24 hour    Equipment Recommendations  3 in 1 bedside commode    Recommendations for Other Services       Precautions / Restrictions Precautions Precautions: Fall Restrictions Weight Bearing Restrictions: No      Mobility Bed Mobility Overal bed mobility: Needs Assistance Bed Mobility: Supine to Sit     Supine to sit: Min guard;Min assist     General bed mobility comments: light min A for full trunk elevation  Transfers Overall transfer level: Needs assistance Equipment used: Rolling walker (2 wheeled) Transfers: Sit to/from Stand Sit to Stand: Min guard              Balance Overall balance assessment: Needs assistance   Sitting balance-Leahy Scale: Fair       Standing balance-Leahy Scale: Poor                             ADL either performed or assessed with clinical  judgement   ADL Overall ADL's : Needs assistance/impaired Eating/Feeding: Set up;Sitting   Grooming: Set up;Sitting   Upper Body Bathing: Sitting;Minimal assistance   Lower Body Bathing: Minimal assistance;Sit to/from stand   Upper Body Dressing : Minimal assistance;Sitting   Lower Body Dressing: Minimal assistance;Sit to/from stand   Toilet Transfer: Min guard;Minimal Insurance claims handler Details (indicate cue type and reason): some assist with navigating obstacles Toileting- Clothing Manipulation and Hygiene: Sit to/from stand;Minimal assistance Toileting - Clothing Manipulation Details (indicate cue type and reason): pt stood with external support while therapist completed pericare     Functional mobility during ADLs: Min guard;Minimal assistance;Rolling walker General ADL Comments: Pt completed bed mobility, in-room functional mobility, toilet transfer and periare as detailed above. Son-in-law and spouse present and involved during session.      Vision         Perception     Praxis      Pertinent Vitals/Pain Pain Assessment: Faces Faces Pain Scale: Hurts little more Pain Location: not specified Pain Intervention(s): Monitored during session     Hand Dominance     Extremity/Trunk Assessment Upper Extremity Assessment Upper Extremity Assessment: Generalized weakness   Lower Extremity Assessment Lower Extremity Assessment: Defer to PT evaluation   Cervical / Trunk Assessment Cervical / Trunk Assessment: Kyphotic   Communication Communication Communication: Expressive difficulties   Cognition Arousal/Alertness: Awake/alert Behavior During Therapy: WFL for tasks assessed/performed Overall Cognitive Status: Impaired/Different from baseline Area of Impairment: Orientation;Attention;Safety/judgement;Awareness;Problem solving  Orientation Level: Disoriented to;Time Current Attention Level: Selective Memory: Decreased short-term  memory Following Commands: Follows one step commands with increased time Safety/Judgement: Decreased awareness of safety Awareness: Emergent       General Comments       Exercises     Shoulder Instructions      Home Living Family/patient expects to be discharged to:: Private residence Living Arrangements: Spouse/significant other Available Help at Discharge: Family;Available 24 hours/day Type of Home: Apartment Home Access: Level entry     Home Layout: One level     Bathroom Shower/Tub: Occupational psychologist: Standard     Home Equipment: Cane - single point;Shower seat          Prior Functioning/Environment Level of Independence: Independent                 OT Problem List: Decreased activity tolerance;Impaired balance (sitting and/or standing);Decreased knowledge of use of DME or AE;Decreased knowledge of precautions;Cardiopulmonary status limiting activity      OT Treatment/Interventions: Self-care/ADL training;DME and/or AE instruction;Therapeutic activities;Patient/family education;Balance training;Energy conservation    OT Goals(Current goals can be found in the care plan section) Acute Rehab OT Goals Patient Stated Goal: Return home OT Goal Formulation: With patient/family Time For Goal Achievement: 12/30/17 Potential to Achieve Goals: Good ADL Goals Pt Will Perform Grooming: standing;with supervision Pt Will Perform Upper Body Bathing: with set-up;with supervision;sitting Pt Will Perform Lower Body Bathing: with supervision;sit to/from stand Pt Will Perform Upper Body Dressing: with set-up;with supervision;sitting Pt Will Perform Lower Body Dressing: with supervision;sit to/from stand Pt Will Transfer to Toilet: with supervision;ambulating Pt Will Perform Toileting - Clothing Manipulation and hygiene: with supervision;sit to/from stand Pt Will Perform Tub/Shower Transfer: with supervision;ambulating;shower seat;rolling walker  OT  Frequency: Min 2X/week   Barriers to D/C:            Co-evaluation              AM-PAC PT "6 Clicks" Daily Activity     Outcome Measure Help from another person eating meals?: None Help from another person taking care of personal grooming?: A Little Help from another person toileting, which includes using toliet, bedpan, or urinal?: A Little Help from another person bathing (including washing, rinsing, drying)?: A Little Help from another person to put on and taking off regular upper body clothing?: A Little Help from another person to put on and taking off regular lower body clothing?: A Little 6 Click Score: 19   End of Session Equipment Utilized During Treatment: Gait belt;Rolling walker Nurse Communication: Mobility status;Other (comment)(up in chair, family present)  Activity Tolerance: Patient limited by fatigue;Patient tolerated treatment well Patient left: in chair;with call bell/phone within reach;with family/visitor present  OT Visit Diagnosis: Unsteadiness on feet (R26.81);Muscle weakness (generalized) (M62.81)                Time: 1601-0932 OT Time Calculation (min): 17 min Charges:  OT General Charges $OT Visit: 1 Visit OT Evaluation $OT Eval Low Complexity: 1 Low G-Codes:       Hortencia Pilar 12/16/2017, 1:54 PM

## 2017-12-16 NOTE — Progress Notes (Signed)
OT Cancellation Note  Patient Details Name: [name] [name] MRN: [id number] DOB: 1926/04/19   Cancelled Treatment:    Reason Eval/Treat Not Completed: Patient at procedure or test/ unavailable. Plan to reattempt.    Tyrone Schimke OTR/L Pager: [phone]  12/16/2017, 9:54 AM

## 2017-12-16 NOTE — Progress Notes (Signed)
Pt's family members called RN to room, and that time pt's abdomin was twitching and they were concerned. On observation pt looked depressed and crying. Per being asked he said he is having abdominal pain, gave morphine 2mg  IV, Family members bed side and MD updating, will continue to monitor the patient  Palma Holter, RN

## 2017-12-16 NOTE — Progress Notes (Signed)
SLP Cancellation Note  Patient Details Name: [name] [name] MRN: [id number] DOB: [birth year]   Cancelled treatment:       Reason Eval/Treat Not Completed: Other (comment). MBS orders received. Will f/u next date.   Deneise Lever, [place], [place] Speech-Language Pathologist [phone]   [name] 12/16/2017, 2:02 PM

## 2017-12-16 NOTE — Plan of Care (Signed)
  Pain Managment: General experience of comfort will improve 12/16/2017 0756 - Completed/Met by Evert Kohl, RN

## 2017-12-17 ENCOUNTER — Inpatient Hospital Stay (HOSPITAL_COMMUNITY): Payer: Medicare Other

## 2017-12-17 LAB — GLUCOSE, CAPILLARY
GLUCOSE-CAPILLARY: 197 mg/dL — AB (ref 65–99)
Glucose-Capillary: 168 mg/dL — ABNORMAL HIGH (ref 65–99)
Glucose-Capillary: 201 mg/dL — ABNORMAL HIGH (ref 65–99)
Glucose-Capillary: 194 mg/dL — ABNORMAL HIGH (ref 65–99)

## 2017-12-17 LAB — COMPREHENSIVE METABOLIC PANEL
ALT: 13 U/L — ABNORMAL LOW (ref 17–63)
AST: 17 U/L (ref 15–41)
Albumin: 2.6 g/dL — ABNORMAL LOW (ref 3.5–5.0)
Alkaline Phosphatase: 39 U/L (ref 38–126)
Anion gap: 9 (ref 5–15)
BUN: 37 mg/dL — ABNORMAL HIGH (ref 6–20)
CHLORIDE: 114 mmol/L — AB (ref 101–111)
CO2: 23 mmol/L (ref 22–32)
Calcium: 8.5 mg/dL — ABNORMAL LOW (ref 8.9–10.3)
Creatinine, Ser: 2.22 mg/dL — ABNORMAL HIGH (ref 0.61–1.24)
GFR calc Af Amer: 29 mL/min — ABNORMAL LOW (ref >60)
GFR calc non Af Amer: 25 mL/min — ABNORMAL LOW (ref >60)
Glucose, Bld: 161 mg/dL — ABNORMAL HIGH (ref 65–99)
POTASSIUM: 3.9 mmol/L (ref 3.5–5.1)
SODIUM: 146 mmol/L — AB (ref 135–145)
Total Bilirubin: 0.7 mg/dL (ref 0.3–1.2)
Total Protein: 5.7 g/dL — ABNORMAL LOW (ref 6.5–8.1)

## 2017-12-17 LAB — CULTURE, BLOOD (ROUTINE X 2)
Culture: NO GROWTH
Culture: NO GROWTH
Special Requests: ADEQUATE
Special Requests: ADEQUATE

## 2017-12-17 LAB — CBC
HEMATOCRIT: 35.9 % — AB (ref 39.0–52.0)
HEMOGLOBIN: 11 g/dL — AB (ref 13.0–17.0)
MCH: 31.3 pg (ref 26.0–34.0)
MCHC: 30.6 g/dL (ref 30.0–36.0)
MCV: 102.3 fL — AB (ref 78.0–100.0)
Platelets: 120 10*3/uL — ABNORMAL LOW (ref 150–400)
RBC: 3.51 MIL/uL — ABNORMAL LOW (ref 4.22–5.81)
RDW: 13.7 % (ref 11.5–15.5)
WBC: 5.5 10*3/uL (ref 4.0–10.5)

## 2017-12-17 MED ORDER — HYDRALAZINE HCL 50 MG PO TABS
50.0000 mg | ORAL_TABLET | Freq: Three times a day (TID) | ORAL | Status: DC
  Administered 2017-12-17 – 2017-12-19 (×5): 50 mg via ORAL
  Filled 2017-12-17 (×6): qty 1

## 2017-12-17 MED ORDER — CARVEDILOL 12.5 MG PO TABS
12.5000 mg | ORAL_TABLET | Freq: Two times a day (BID) | ORAL | Status: DC
  Administered 2017-12-17 – 2017-12-19 (×4): 12.5 mg via ORAL
  Filled 2017-12-17 (×4): qty 1

## 2017-12-17 MED ORDER — ISOSORBIDE MONONITRATE ER 60 MG PO TB24
120.0000 mg | ORAL_TABLET | Freq: Every day | ORAL | Status: DC
  Administered 2017-12-17 – 2017-12-19 (×3): 120 mg via ORAL
  Filled 2017-12-17 (×3): qty 2

## 2017-12-17 MED ORDER — FUROSEMIDE 80 MG PO TABS
80.0000 mg | ORAL_TABLET | Freq: Every day | ORAL | Status: DC
  Administered 2017-12-17 – 2017-12-18 (×2): 80 mg via ORAL
  Filled 2017-12-17 (×2): qty 1

## 2017-12-17 MED ORDER — RESOURCE THICKENUP CLEAR PO POWD
ORAL | Status: DC | PRN
  Filled 2017-12-17: qty 125

## 2017-12-17 MED ORDER — HYDRALAZINE HCL 20 MG/ML IJ SOLN
10.0000 mg | Freq: Four times a day (QID) | INTRAMUSCULAR | Status: DC
  Administered 2017-12-17: 10 mg via INTRAVENOUS
  Filled 2017-12-17: qty 1

## 2017-12-17 MED ORDER — FUROSEMIDE 10 MG/ML IJ SOLN: 80.0000 mg | Freq: Two times a day (BID) | INTRAMUSCULAR | Status: DC

## 2017-12-17 NOTE — Progress Notes (Signed)
Modified Barium Swallow Progress Note  Patient Details  Name: [name] [name] MRN: [id number] Date of Birth: 13-Oct-1926  Today's Date: 12/17/2017  Modified Barium Swallow completed.  Full report located under Chart Review in the Imaging Section.  Brief recommendations include the following:  Clinical Impression  Pt has a mild oropharyngeal dysphagia with more prominent cervical esophageal issues. He has intermittent penetration and silent aspiration into the airway before the swallow with larger boluses of thin liquids secondary to premature spillage. This is reduced with smaller cup sips, but pt also has retrograde flow from the cervical esophagus back into the pharynx and larynx, requiring cues for more effortful cough to clear this penetration. He has improved oral containment when consuming nectar thick liquids via straw, but all consistencies tested have slow clearance through the cervical esophagus. Heavier boluses still had backflow within the UES, but did not reach all the way back into the larynx. Although pt does say that he can sense when boluses "come back up", he does not show any signs to suggest that he is aware of any airway compromise. SLP provided Min cues for use of chin tuck, but this did not improve airway protection or clearance of mild vallecular residue. Recommend initiation of Dys 2 diet and nectar thick liquids by straw, using intermittent throat clearing and esophageal/aspiration precautions. Recommend additional f/u with ENT to address cervical esophageal issues.   Swallow Evaluation Recommendations       SLP Diet Recommendations: Dysphagia 2 (Fine chop) solids;Nectar thick liquid   Liquid Administration via: Straw   Medication Administration: Crushed with puree   Supervision: Patient able to self feed;Full supervision/cueing for compensatory strategies   Compensations: Slow rate;Small sips/bites;Follow solids with liquid;Clear throat intermittently   Postural  Changes: Remain semi-upright after after feeds/meals (Comment);Seated upright at 90 degrees   Oral Care Recommendations: Oral care BID   Other Recommendations: Order thickener from pharmacy;Prohibited food (jello, ice cream, thin soups);Remove water pitcher    Germain Osgood 12/17/2017,12:18 PM   Germain Osgood, M.A. CCC-SLP [phone]

## 2017-12-17 NOTE — Plan of Care (Signed)
Pt. Able to void using urinal.

## 2017-12-17 NOTE — Progress Notes (Addendum)
Advanced Heart Failure Rounding Note  PCP:  Primary Cardiologist: Dr. Aundra Dubin   Subjective:   Off diuretics. Off oral medications due to concern for aspiration. Started on IV hydralazine.  Complaining of cough with thick sputum. Denies SOB/CP.    Speech saw 12/13/16 for pharyngeal dysphagia marked by "explosive, prolonged coughing". GI consulted as well who recommended ENT. ENT plan for barium swallow today.    VQ scan 12/12/17 negative/"very low probability for PE" CT Chest/Abd/Pelvis 12/12/17 negative for any acute abnormality.  Objective:   Weight Range: 151 lb 8 oz (68.7 kg) Body mass index is 23.04 kg/m.   Vital Signs:   Temp:  [97.6 F (36.4 C)-98.5 F (36.9 C)] 98.2 F (36.8 C) (01/07 0500) Pulse Rate:  [51-63] 55 (01/07 0500) Resp:  [18] 18 (01/07 0500) BP: (154-199)/(47-79) 177/52 (01/07 0500) SpO2:  [94 %-100 %] 98 % (01/07 0500) Weight:  [151 lb 8 oz (68.7 kg)] 151 lb 8 oz (68.7 kg) (01/07 0500) Last BM Date: 12/16/17  Weight change: Filed Weights   12/15/17 0520 12/16/17 0704 12/17/17 0500  Weight: 152 lb 1.9 oz (69 kg) 150 lb 1.6 oz (68.1 kg) 151 lb 8 oz (68.7 kg)   Intake/Output:   Intake/Output Summary (Last 24 hours) at 12/17/2017 0925 Last data filed at 12/17/2017 0830 Gross per 24 hour  Intake 1936.25 ml  Output 1300 ml  Net 636.25 ml    Physical Exam    General:  Elderly sitting in the chair.  No resp difficulty. On room air.  HEENT: normal Neck: supple. no JVD. Carotids 2+ bilat; no bruits. No lymphadenopathy or thryomegaly appreciated. Cor: PMI nondisplaced. Regular rate & rhythm. 2/6 SEM RUSB.  Lungs: clear Abdomen: soft, nontender, nondistended. No hepatosplenomegaly. No bruits or masses. Good bowel sounds. Extremities: no cyanosis, clubbing, rash, edema   Telemetry   Sinus Rhythm SInus Loletha Grayer 50-70s personally reviewed.   EKG    No new tracing.   Labs    CBC Recent Labs    12/17/17 0626  WBC 5.5  HGB 11.0*  HCT 35.9*  MCV  102.3*  PLT 338*   Basic Metabolic Panel Recent Labs    12/16/17 0505 12/17/17 0626  NA 141 146*  K 3.3* 3.9  CL 105 114*  CO2 28 23  GLUCOSE 156* 161*  BUN 56* 37*  CREATININE 2.77* 2.22*  CALCIUM 8.6* 8.5*   Liver Function Tests Recent Labs    12/17/17 0626  AST 17  ALT 13*  ALKPHOS 39  BILITOT 0.7  PROT 5.7*  ALBUMIN 2.6*   No results for input(s): LIPASE, AMYLASE in the last 72 hours. Cardiac Enzymes No results for input(s): CKTOTAL, CKMB, CKMBINDEX, TROPONINI in the last 72 hours.  BNP: BNP (last 3 results) Recent Labs    07/05/17 1517 07/11/17 1734 12/12/17 1335  BNP 529.6* 281.4* 384.0*    ProBNP (last 3 results) No results for input(s): PROBNP in the last 8760 hours.   D-Dimer No results for input(s): DDIMER in the last 72 hours. Hemoglobin A1C No results for input(s): HGBA1C in the last 72 hours. Fasting Lipid Panel No results for input(s): CHOL, HDL, LDLCALC, TRIG, CHOLHDL, LDLDIRECT in the last 72 hours. Thyroid Function Tests No results for input(s): TSH, T4TOTAL, T3FREE, THYROIDAB in the last 72 hours.  Invalid input(s): FREET3  Other results:   Imaging    Dg Esophagus  Result Date: 12/16/2017 CLINICAL DATA:  Dysphasia, history of proximal esophageal stricture, difficulty swallowing ice chips EXAM: ESOPHOGRAM/BARIUM  SWALLOW TECHNIQUE: Single contrast examination was performed using  thin barium. FLUOROSCOPY TIME:  Fluoroscopy Time:  1 minutes 6 seconds Radiation Exposure Index (if provided by the fluoroscopic device): 18.2 mGy Number of Acquired Spot Images: 7 COMPARISON:  None. FINDINGS: Due to patient condition and inability to stand, upright and prone evaluation could not be performed. As such, only single-contrast supine evaluation was performed. No fixed narrowing or stricture of the esophagus. Note: if there is concern for upper cervical web, this would need to be excluded on a lateral view. As the patient was unable to cooperate with  an upright evaluation, this could not be performed. However, this may be possible during a speech pathology evaluation in a seated position. Study was prematurely aborted due to tracheobronchial aspiration into the left mainstem bronchus and the left lower lobe. The aspiration elicited a cough reflex. IMPRESSION: Limited evaluation, due to the patient's inability to stand, as well as the study being prematurely aborted. Tracheobronchial aspiration, as described above. Consider speech pathology evaluation as clinically warranted. No fixed narrowing or stricture of the esophagus. However, an upper cervical web would not have been detected during this study. This could be further assessed at time of speech pathology evaluation, if desired. These results were called by telephone at the time of interpretation on 12/16/2017 at 10:35 am to Dr. Marylu Lund , who verbally acknowledged these results. Electronically Signed   By: Julian Hy M.D.   On: 12/16/2017 10:43    Medications:     Scheduled Medications: . aspirin  150 mg Rectal Daily  . heparin  5,000 Units Subcutaneous Q8H  . hydrALAZINE  5 mg Intravenous Q6H  . insulin aspart  0-5 Units Subcutaneous QHS  . insulin aspart  0-9 Units Subcutaneous TID WC  . metoprolol tartrate  5 mg Intravenous Q6H  . pantoprazole (PROTONIX) IV  40 mg Intravenous Q24H    Infusions: . azithromycin Stopped (12/16/17 1812)  . cefTRIAXone (ROCEPHIN)  IV 1 g (12/17/17 0626)  . dextrose 5 % and 0.9% NaCl 75 mL/hr (12/17/17 0835)    PRN Medications: acetaminophen, gi cocktail, hydrALAZINE, morphine injection, nitroGLYCERIN, ondansetron (ZOFRAN) IV  Patient Profile   [name] [name] is an19 y.o. male with a PMHx of CAD, CABG, bioprosthetic AVR, anemia, CKD stage 4, chronic systolic heart failure. He is not on an ACE or ARB due renal failure.   Brought in via EMS with recurrent CP, not relieved by NTG.   Assessment/Plan   1. Chest pain => Pleuritic  => ?  Infectious process +/- Dysphagia - Atypical. Affecting his left chest and ? Upper abdomen.  Not worse with palpation but worse with respirations.  - D-dimer 0.59. VQ scan negative.  - CXR showed LLL PNA but CT chest/abdomen/pelvis did not show PNA or other acute abnormality.   -Remains on rocephin and azithromycin until 12/20/17. - Now questioning component of dysphagia, ?possible aspiration though infiltrate not seen on CT. GI following and ENT to see with history of same.  2. CAD: S/p CABG - Has multiple ED visits and admissions with CP. Troponin negative and usually improves with lasix.  - As previously discussed, with absence of definite MI, would avoid cardiac cath with CKD stage 4 and continue to treat symptomatically (therefore, will not do stress test). - Continue ASA 81.  - No statin with myalgias. Off po meds due to aspiration.  -3. ARF on CKD stage IV:  - Creatinine peaked 3.8>2.2. Creatinine trending down.   4. Aortic valve  replacement: Bioprosthetic. - Valve looked stable on 7/18 echo. No change.  5. Chronic systolic CHF: Ischemic cardiomyopathy.  - EF 25-30% on 7/18 echo, down from EF 40-45%. - Hold lasix for now with ARF. On NS @ 75 cc per hour. -Off po meds with concern for aspiration.  -Increase Hydralazine to 10 mg q 6 hours.  - continue lopressor 5 mg every 6 hours. He has only received one dose due to bradycardia.  - Not on ACEI because of CKD.  6. Delirium - Much improved.  7. Deconditioning - PT seeing and recommending rehab. Wife states he would not want this at all.  - He is DNR. May need to consider palliative care/hospice if not stable at home. No change.  8. Oropharyngeal dysphagia: Has had upper esophageal dilatation in the past by ENT. Speech saw 12/13/16 for pharyngeal dysphagia marked by "explosive, prolonged coughing". ENT consulted. Plan for Barium swallow today.   Medication concerns reviewed with patient and pharmacy team. Barriers identified: None at  this time.  Length of Stay: 4  Amy Clegg, NP  12/17/2017, 9:25 AM  Advanced Heart Failure Team Pager [phone] (M-F; 7a - 4p)  Please contact [facility] for night-coverage after hours (4p -7a ) and weekends on [email or web address]  Patient seen with NP, agree with the above note.  He is stable today.  Modified barium swallow done, aspiration noted.  Recommended Dysphagia II diet and nectar thick liquids.    It is possible that his sudden dyspnea/pleuritic chest pain episodes are due to aspiration events rather than primary cardiac events.   I am going to restart him on home hydralazine/Imdur and Coreg today (BP high).  Now that he is eating/drinking again, I will stop IVF and start him back on Lasix at 80 mg daily (was on 80 bid at home).   Creatinine 2.2, below what was thought to be his baseline.    Hopefully ready for discharge soon.   Loralie Champagne 12/17/2017 2:28 PM

## 2017-12-17 NOTE — Progress Notes (Signed)
Physical Therapy Treatment Patient Details Name: [name] [name] MRN: [id number] DOB: 03/19/1926 Today's Date: 12/17/2017    History of Present Illness Pt is an 84 y.o. male admitted 12/12/16 with c/o sudden chest pain and intermittent cough; determined to be likely pleuritic, and worked up for potential L-sided community-acquired bacterial pneumonia; chest CT 12/12/17 no acute abnormality identified in the chest, abdomen, or pelvis. Speech pathology eval 1/3 indicated significant oral pharyngeal dysphagia. Pertinent PMH includes CAD s/p CABG, biprosthetic AVR, anemia, CKD IV, CHF, colon cancer.    PT Comments    Pt progressing towards physical therapy goals. Was able to perform transfers and ambulation with gross min guard assist for balance support and safety. Pt requires frequent cues for walker management and improved posture. Pt endorses feeling weak however demonstrated a good rehab effort overall. Will continue to follow.   Follow Up Recommendations  Home health PT;Supervision for mobility/OOB     Equipment Recommendations  None recommended by PT(son owns RW)    Recommendations for Other Services OT consult     Precautions / Restrictions Precautions Precautions: Fall Restrictions Weight Bearing Restrictions: No    Mobility  Bed Mobility Overal bed mobility: Needs Assistance Bed Mobility: Supine to Sit     Supine to sit: Min guard     General bed mobility comments: Increased time, however pt was able to transition to EOB without assistance.   Transfers Overall transfer level: Needs assistance Equipment used: Rolling walker (2 wheeled) Transfers: Sit to/from Stand Sit to Stand: Min guard         General transfer comment: Min guard for safety, but no physical assist required  Ambulation/Gait Ambulation/Gait assistance: Min guard;Supervision Ambulation Distance (Feet): 300 Feet Assistive device: Rolling walker (2 wheeled) Gait Pattern/deviations: Step-through  pattern;Decreased stride length;Trunk flexed Gait velocity: Decreased Gait velocity interpretation: <1.8 ft/sec, indicative of risk for recurrent falls General Gait Details: Grossly ambulating at a min guard assist level. VC's for improved posture and forward gaze. Pt reports no DOE and minimal fatigue at end of gait training.    Stairs            Wheelchair Mobility    Modified Rankin (Stroke Patients Only)       Balance Overall balance assessment: Needs assistance Sitting-balance support: Feet supported;Feet unsupported Sitting balance-Leahy Scale: Fair     Standing balance support: Bilateral upper extremity supported;During functional activity Standing balance-Leahy Scale: Poor Standing balance comment: Reliant on RW for support                            Cognition Arousal/Alertness: Awake/alert Behavior During Therapy: WFL for tasks assessed/performed Overall Cognitive Status: Impaired/Different from baseline Area of Impairment: Attention;Safety/judgement;Awareness;Problem solving                   Current Attention Level: Selective Memory: Decreased short-term memory Following Commands: Follows one step commands with increased time;Follows one step commands consistently Safety/Judgement: Decreased awareness of safety Awareness: Emergent Problem Solving: Requires verbal cues;Slow processing General Comments: Cognition much improved this session      Exercises      General Comments        Pertinent Vitals/Pain Pain Assessment: No/denies pain Faces Pain Scale: Hurts little more Pain Location: not specified Pain Intervention(s): Limited activity within patient's tolerance;Monitored during session;Repositioned    Home Living                      Prior  Function            PT Goals (current goals can now be found in the care plan section) Acute Rehab PT Goals Patient Stated Goal: Return home PT Goal Formulation: With  patient/family Time For Goal Achievement: 12/27/17 Potential to Achieve Goals: Good Progress towards PT goals: Progressing toward goals    Frequency    Min 3X/week      PT Plan Discharge plan needs to be updated    Co-evaluation              AM-PAC PT "6 Clicks" Daily Activity  Outcome Measure  Difficulty turning over in bed (including adjusting bedclothes, sheets and blankets)?: A Little Difficulty moving from lying on back to sitting on the side of the bed? : A Little Difficulty sitting down on and standing up from a chair with arms (e.g., wheelchair, bedside commode, etc,.)?: A Little Help needed moving to and from a bed to chair (including a wheelchair)?: A Little Help needed walking in hospital room?: A Little Help needed climbing 3-5 steps with a railing? : A Little 6 Click Score: 18    End of Session Equipment Utilized During Treatment: Gait belt Activity Tolerance: Patient tolerated treatment well Patient left: in chair;with call bell/phone within reach;with family/visitor present Nurse Communication: Mobility status PT Visit Diagnosis: Other abnormalities of gait and mobility (R26.89);Muscle weakness (generalized) (M62.81)     Time: 6168-3729 PT Time Calculation (min) (ACUTE ONLY): 15 min  Charges:  $Gait Training: 8-22 mins                    G Codes:       Rolinda Roan, PT, DPT Acute Rehabilitation Services Pager: [phone] 12/17/2017, 1:05 PM

## 2017-12-17 NOTE — Progress Notes (Signed)
PROGRESS NOTE    [name] [name]  JWJ:[id number] DOB: 14-Jul-1926 DOA: 12/12/2017 PCP: Gayland Curry, DO    Brief Narrative:  84 y.o. male with medical history significant of coronary artery disease, CABG, bioprosthetic AVR, anemia, chronic kidney disease stage IV, chronic systolic heart failure and colon cancer presented today with complaints of sudden onset of chest pain.  Patient states that he started having chest pain early this morning after he woke up, mostly left-sided, sharp in nature, intermittent, up to 7 out of 10 intensity with radiation to the whole chest and his abdomen.  He also complained of some shortness of breath.  Chest pain was noted with nitroglycerin.  Patient also complains of intermittent cough but no productive sputum, fever, nausea, vomiting, diarrhea, chest trauma.  Patient denied any recent weight gain or decreased urine output.  Assessment & Plan:   Active Problems:   Chronic combined systolic and diastolic heart failure, NYHA class 2 (HCC)   Chronic kidney disease (CKD), stage IV (severe) (HCC)   Diabetes mellitus type 2, controlled, with complications (HCC)   Chest pain   Community acquired pneumonia   Pneumonia  Non-cardiac Chest pain -Unrelieved by nitroglycerin -Suspect esophogeal in etiology. CT chest without evidence on PNA -On empiric antibiotics for PNA. -VQ without evidence of PE -2d echo with EF of 40-45% -Chart reviewed. Patient with hx of proximal esophageal strictures s/p prior dilation in 2015. In room, pt noted to have difficulty swallowing ice chip and attempted to "fish" ice chip out -Consulted GI and ENT given hx of proximal esophageal stricture  Aspiration Pneumonia -Continue Rocephin and Zithromax -Continue with O2 as tolerated -Patient with witnessed aspiration of contrast into airway per Radiology during barium swallow which was stopped prematurely secondary to aspiration -SLP following - now cleared for dysphagia 2 diet -If  patient tolerates diet without difficulty, would plan resuming PO meds  Coronary artery disease status post CABG -History of recurrent chest pain with multiple ED visits and admissions -Initial troponin negative.  Cycle troponins.  Cardiology following. -Continue aspirin, Coreg, Imdur, hydralazine as tolerated -remains stable at this time  Chronic systolic CHF with ischemic cardiomyopathy with history of bioprosthetic AVR -EF of 25-30% on 7/18. -Recently noted to be clinically dehydrated on exam with worsening renal function in the setting of diuresis with poor PO intake -Lasix on hold with gentle IVF hydration -Renal function now improved with IVF  CKD stage IV -Monitor renal function while on IV diuretics -Cr has improved with IVF  Diabetes mellitus type 2 -Holding oral meds. Sliding scale insulin. Hemoglobin A1c noted to be 6.1  HTN -BP suboptimally controlled -Given concerns of dysphagia, have transitioned to IV beta blocker with IV hydralazine PRN sbp>160 -BP poorly controlled. Will start scheduled IV hydralazine -Given poor renal function, cannot give ARB or ACEI. Given relative bradycardia, will avoid clonidine -If patient   Lower quadrant abd pain -Abd xray reviewed. Findings suggestive of constipation -Good results noted with soap suds enema  DVT prophylaxis: Heparin subQ Code Status: DNR Family Communication: Pt in room, family at bedside Disposition Plan: Uncertain at this time  Consultants:   GI  ENT  Cardiology  Procedures:     Antimicrobials: Anti-infectives (From admission, onward)   Start     Dose/Rate Route Frequency Ordered Stop   12/14/17 1630  azithromycin (ZITHROMAX) 250 mg in dextrose 5 % 125 mL IVPB    Comments:  Dysphagia. Cannot take pills   250 mg 125 mL/hr over 60 Minutes Intravenous  Every 24 hours 12/14/17 1618     12/13/17 0800  cefTRIAXone (ROCEPHIN) 1 g in dextrose 5 % 50 mL IVPB     1 g 100 mL/hr over 30 Minutes Intravenous  Every 24 hours 12/12/17 1959 12/20/17 0759   12/13/17 0800  azithromycin (ZITHROMAX) tablet 500 mg  Status:  Discontinued     500 mg Oral Every 24 hours 12/12/17 1959 12/14/17 1618   12/12/17 1630  cefTRIAXone (ROCEPHIN) 1 g in dextrose 5 % 50 mL IVPB     1 g 100 mL/hr over 30 Minutes Intravenous  Once 12/12/17 1622 12/12/17 1736   12/12/17 1630  azithromycin (ZITHROMAX) tablet 500 mg     500 mg Oral  Once 12/12/17 1622 12/12/17 1705      Subjective: Reports feeling better today  Objective: Vitals:   12/16/17 2116 12/16/17 2300 12/17/17 0500 12/17/17 1100  BP: (!) 155/79 (!) 154/53 (!) 177/52 (!) 165/56  Pulse: (!) 59 (!) 57 (!) 55 (!) 59  Resp:   18   Temp:   98.2 F (36.8 C)   TempSrc:   Oral   SpO2:   98%   Weight:   68.7 kg (151 lb 8 oz)   Height:        Intake/Output Summary (Last 24 hours) at 12/17/2017 1543 Last data filed at 12/17/2017 1034 Gross per 24 hour  Intake 1302.5 ml  Output 800 ml  Net 502.5 ml   Filed Weights   12/15/17 0520 12/16/17 0704 12/17/17 0500  Weight: 69 kg (152 lb 1.9 oz) 68.1 kg (150 lb 1.6 oz) 68.7 kg (151 lb 8 oz)    Examination: General exam: Conversant, in no acute distress Respiratory system: normal chest rise, clear, no audible wheezing Cardiovascular system: regular rhythm, s1-s2 Gastrointestinal system: Nondistended, nontender, pos BS Central nervous system: No seizures, no tremors Extremities: No cyanosis, no joint deformities Skin: No rashes, no pallor Psychiatry: Affect normal // no auditory hallucinations   Data Reviewed: I have personally reviewed following labs and imaging studies  CBC: Recent Labs  Lab 12/12/17 1335 12/13/17 0807 12/17/17 0626  WBC 6.3 15.8* 5.5  NEUTROABS  --  13.5*  --   HGB 11.4* 13.3 11.0*  HCT 35.0* 40.3 35.9*  MCV 97.8 100.5* 102.3*  PLT 139* 148* 326*   Basic Metabolic Panel: Recent Labs  Lab 12/13/17 0807 12/14/17 0552 12/15/17 0519 12/16/17 0505 12/17/17 0626  NA 140 143 143  141 146*  K 4.2 4.0 3.6 3.3* 3.9  CL 96* 101 101 105 114*  CO2 30 34* 29 28 23   GLUCOSE 140* 130* 123* 156* 161*  BUN 68* 77* 70* 56* 37*  CREATININE 3.26* 3.84* 3.39* 2.77* 2.22*  CALCIUM 9.4 8.9 8.8* 8.6* 8.5*   GFR: Estimated Creatinine Clearance: 21.8 mL/min (A) (by C-G formula based on SCr of 2.22 mg/dL (H)). Liver Function Tests: Recent Labs  Lab 12/17/17 0626  AST 17  ALT 13*  ALKPHOS 39  BILITOT 0.7  PROT 5.7*  ALBUMIN 2.6*   No results for input(s): LIPASE, AMYLASE in the last 168 hours. No results for input(s): AMMONIA in the last 168 hours. Coagulation Profile: No results for input(s): INR, PROTIME in the last 168 hours. Cardiac Enzymes: Recent Labs  Lab 12/12/17 1815 12/12/17 2344 12/13/17 0536  TROPONINI 0.03* 0.03* 0.04*   BNP (last 3 results) No results for input(s): PROBNP in the last 8760 hours. HbA1C: No results for input(s): HGBA1C in the last 72 hours. CBG: Recent Labs  Lab 12/16/17 1139 12/16/17 1616 12/16/17 2131 12/17/17 0723 12/17/17 1202  GLUCAP 111* 135* 134* 168* 201*   Lipid Profile: No results for input(s): CHOL, HDL, LDLCALC, TRIG, CHOLHDL, LDLDIRECT in the last 72 hours. Thyroid Function Tests: No results for input(s): TSH, T4TOTAL, FREET4, T3FREE, THYROIDAB in the last 72 hours. Anemia Panel: No results for input(s): VITAMINB12, FOLATE, FERRITIN, TIBC, IRON, RETICCTPCT in the last 72 hours. Sepsis Labs: No results for input(s): PROCALCITON, LATICACIDVEN in the last 168 hours.  Recent Results (from the past 240 hour(s))  Culture, blood (routine x 2)     Status: None   Collection Time: 12/12/17  6:15 PM  Result Value Ref Range Status   Specimen Description BLOOD BLOOD RIGHT FOREARM  Final   Special Requests   Final    BOTTLES DRAWN AEROBIC AND ANAEROBIC Blood Culture adequate volume   Culture NO GROWTH 5 DAYS  Final   Report Status 12/17/2017 FINAL  Final  Culture, blood (routine x 2)     Status: None   Collection Time:  12/12/17  6:18 PM  Result Value Ref Range Status   Specimen Description BLOOD LEFT ANTECUBITAL  Final   Special Requests IN PEDIATRIC BOTTLE Blood Culture adequate volume  Final   Culture NO GROWTH 5 DAYS  Final   Report Status 12/17/2017 FINAL  Final     Radiology Studies: Dg Esophagus  Result Date: 12/16/2017 CLINICAL DATA:  Dysphasia, history of proximal esophageal stricture, difficulty swallowing ice chips EXAM: ESOPHOGRAM/BARIUM SWALLOW TECHNIQUE: Single contrast examination was performed using  thin barium. FLUOROSCOPY TIME:  Fluoroscopy Time:  1 minutes 6 seconds Radiation Exposure Index (if provided by the fluoroscopic device): 18.2 mGy Number of Acquired Spot Images: 7 COMPARISON:  None. FINDINGS: Due to patient condition and inability to stand, upright and prone evaluation could not be performed. As such, only single-contrast supine evaluation was performed. No fixed narrowing or stricture of the esophagus. Note: if there is concern for upper cervical web, this would need to be excluded on a lateral view. As the patient was unable to cooperate with an upright evaluation, this could not be performed. However, this may be possible during a speech pathology evaluation in a seated position. Study was prematurely aborted due to tracheobronchial aspiration into the left mainstem bronchus and the left lower lobe. The aspiration elicited a cough reflex. IMPRESSION: Limited evaluation, due to the patient's inability to stand, as well as the study being prematurely aborted. Tracheobronchial aspiration, as described above. Consider speech pathology evaluation as clinically warranted. No fixed narrowing or stricture of the esophagus. However, an upper cervical web would not have been detected during this study. This could be further assessed at time of speech pathology evaluation, if desired. These results were called by telephone at the time of interpretation on 12/16/2017 at 10:35 am to Dr. Marylu Lund ,  who verbally acknowledged these results. Electronically Signed   By: Julian Hy M.D.   On: 12/16/2017 10:43   Dg Swallowing Func-speech Pathology  Result Date: 12/17/2017 Objective Swallowing Evaluation: Type of Study: MBS-Modified Barium Swallow Study  Patient Details Name: Chamberlain Steinborn MRN: [id number] Date of Birth: 1926/06/30 Today's Date: 12/17/2017 Time: SLP Start Time (ACUTE ONLY): 0093 -SLP Stop Time (ACUTE ONLY): 1103 SLP Time Calculation (min) (ACUTE ONLY): 21 min Past Medical History: Past Medical History: Diagnosis Date . Anemia  . Anxiety  . Aortic stenosis   a. s/p AVR with 47mm Edwards pericardial valve 02/07/12 - post-op course complicated by pleural effusion  requring thoracentesis/leg cellulitis 03/2012. . Baker's cyst 07/23/12  Incidental finding of LE venous dopplers . Blood transfusion   NO REACTION TO TRANSFUSION . CAD (coronary artery disease)   a. s/p NSTEMI 12/2011:  LHC - Ostial left main 20%, ostial LAD 50%, mid 60-70%, ostial D1 40% and mid 40%, D2 70%, ostial circumflex occluded, ostial RCA 80-90%, LVEDP was 42. b.  s/p CABG x 3 at time of AVR (LiMA-LAD, SVG-2nd daigonal, SVG-PDA) 02/07/12 (post-op course noted above). . Cancer [facility]) '90's  Colon . Cataract   right eye, hx of . Cellulitis   a. RLE cellulitis 2 months post-operatively after AVR/CABG - serratia marcessans, tx with I&D/antibiotics . CHF (congestive heart failure) ([place])  . Chronic kidney disease  . Chronic systolic heart failure ([place])   a. TEE 01/2012: EF 25-30%, diffuse hypokinesis. b. Not on ACEI due to renal insufficiency.;  c. follow up  echo 08/06/12: EF 25%, mod diast dysfxn, AVR ok, mild MR, mod LAE, mild RAE, mild to mod RV systolic dysfunction . Diabetes mellitus   borderline . Flash pulmonary edema ([place])   Post-cath 12/2011, went into acute pulm edema requiring IV lasix and intubation . GERD (gastroesophageal reflux disease)  . History of colon cancer   s/p colon resection . HLD (hyperlipidemia)  . HTN  (hypertension)   primary, Dr. Hollace Kinnier . Ischemic cardiomyopathy  . Mitral regurgitation   Moderate by TEE 01/2012 . Myocardial infarction ([place]) 12/2011 . Pleural effusion   a. post-operatively after AVR/CABG s/p thoracentesis 03/2012 yielding 1L serosanguinous fluid. . Stroke ([place]) 10/01  RIght Leg weakness Past Surgical History: Past Surgical History: Procedure Laterality Date . AORTIC VALVE REPLACEMENT  02/07/2012  Procedure: AORTIC VALVE REPLACEMENT (AVR);  Surgeon: Gaye Pollack, MD;  Location: [place];  Service: Open Heart Surgery;  Laterality: N/A; . CARDIAC CATHETERIZATION    1.3.13  stopped breathing, put on ventilator for 5-6 days . CATARACT EXTRACTION    rt . CHEST TUBE INSERTION  07/24/2012  Procedure: INSERTION PLEURAL DRAINAGE CATHETER;  Surgeon: Gaye Pollack, MD;  Location: [place];  Service: Thoracic;  Laterality: Left; . COLON RESECTION  1996 . COLONOSCOPY   . CORONARY ARTERY BYPASS GRAFT  02/07/2012  Procedure: CORONARY ARTERY BYPASS GRAFTING (CABG);  Surgeon: Gaye Pollack, MD;  Location: [place];  Service: Open Heart Surgery;  Laterality: N/A;  CABG x three; using right leg greater saphenous vein harvested endoscopically . ERCP N/A 07/08/2014  Procedure: ENDOSCOPIC RETROGRADE CHOLANGIOPANCREATOGRAPHY (ERCP);  Surgeon: Missy Sabins, MD;  Location: [facility] ENDOSCOPY;  Service: Endoscopy;  Laterality: N/A; . ESOPHAGEAL DILATION   . ESOPHAGOGASTRODUODENOSCOPY N/A 07/03/2014  Procedure: ESOPHAGOGASTRODUODENOSCOPY (EGD);  Surgeon: Arta Silence, MD;  Location: [facility] ENDOSCOPY;  Service: Endoscopy;  Laterality: N/A; . ESOPHAGOSCOPY WITH DILITATION N/A 07/07/2014  Procedure: ESOPHAGOSCOPY WITH DILITATION/SAVARY DILATOR;  Surgeon: Rozetta Nunnery, MD;  Location: [facility] OR;  Service: ENT;  Laterality: N/A; . EYE SURGERY  2009  cataract removed from right eye . INGUINAL HERNIA REPAIR Right 09/13/2015  Procedure: OPEN RIGHT INGUINAL HERNIA;  Surgeon: Mickeal Skinner, MD;  Location: [place];  Service: General;   Laterality: Right; . INSERTION OF MESH Right 09/13/2015  Procedure: INSERTION OF MESH;  Surgeon: Mickeal Skinner, MD;  Location: [place];  Service: General;  Laterality: Right; . LEFT HEART CATHETERIZATION WITH CORONARY ANGIOGRAM N/A 12/13/2011  Procedure: LEFT HEART CATHETERIZATION WITH CORONARY ANGIOGRAM;  Surgeon: Peter M Martinique, MD;  Location: [facility] CATH LAB;  Service: Cardiovascular;  Laterality: N/A; .  REMOVAL OF PLEURAL DRAINAGE CATHETER  09/27/2012  Procedure: REMOVAL OF PLEURAL DRAINAGE CATHETER;  Surgeon: Gaye Pollack, MD;  Location: [place];  Service: Thoracic;  Laterality: Left; . TALC PLEURODESIS  08/30/2012  Procedure: Pietro Cassis;  Surgeon: Gaye Pollack, MD;  Location: [place];  Service: Thoracic;  Laterality: Left;  INSERTION OF TALC VIA LEFT PLEURX . TALC PLEURODESIS  09/12/2012  Procedure: Pietro Cassis;  Surgeon: Gaye Pollack, MD;  Location: [place];  Service: Thoracic;  Laterality: Left; . TONSILLECTOMY   HPI: Cid Agena a 84 y.o.malewith medical history significant ofcoronary artery disease, dysphagia, esophageal stricture with dilation, CABG, bioprosthetic AVR, anemia, chronic kidney disease stage IV, chronic systolic heart failure and colon cancer presented today with complaints of sudden onset of chest pain. July 24th 2015 ERCP revealed "very tight cervical esophageal web and unable to traverse endoscope, therefore ERCP procedure aborted." EGD done under general anethesia for esophageal dilation. MBS 2016 incomplete epiglottic closure and backflow from cervical esophagus with aspirated thin and penetrated nectar; D2/thin recommended. BSE 10/24/15 regular/thin recommended. Chest Xray 1/2 concerning for pna however chest CT 12/12/17 no acute abnormality identified in the chest, abdomen, or pelvis.  Subjective: pt alert, cooperative Assessment / Plan / Recommendation CHL IP CLINICAL IMPRESSIONS 12/17/2017 Clinical Impression Pt has a mild oropharyngeal dysphagia with more prominent  cervical esophageal issues. He has intermittent penetration and silent aspiration into the airway before the swallow with larger boluses of thin liquids secondary to premature spillage. This is reduced with smaller cup sips, but pt also has retrograde flow from the cervical esophagus back into the pharynx and larynx, requiring cues for more effortful cough to clear this penetration. He has improved oral containment when consuming nectar thick liquids via straw, but all consistencies tested have slow clearance through the cervical esophagus. Heavier boluses still had backflow within the UES, but did not reach all the way back into the larynx. Although pt does say that he can sense when boluses "come back up", he does not show any signs to suggest that he is aware of any airway compromise. SLP provided Min cues for use of chin tuck, but this did not improve airway protection or clearance of mild vallecular residue. Recommend initiation of Dys 2 diet and nectar thick liquids by straw, using intermittent throat clearing and esophageal/aspiration precautions. Recommend additional f/u with ENT to address cervical esophageal issues. SLP Visit Diagnosis Dysphagia, pharyngoesophageal phase (R13.14) Attention and concentration deficit following -- Frontal lobe and executive function deficit following -- Impact on safety and function Moderate aspiration risk   CHL IP TREATMENT RECOMMENDATION 12/17/2017 Treatment Recommendations Therapy as outlined in treatment plan below   Prognosis 12/17/2017 Prognosis for Safe Diet Advancement Fair Barriers to Reach Goals Time post onset Barriers/Prognosis Comment -- CHL IP DIET RECOMMENDATION 12/17/2017 SLP Diet Recommendations Dysphagia 2 (Fine chop) solids;Nectar thick liquid Liquid Administration via Straw Medication Administration Crushed with puree Compensations Slow rate;Small sips/bites;Follow solids with liquid;Clear throat intermittently Postural Changes Remain semi-upright after after  feeds/meals (Comment);Seated upright at 90 degrees   CHL IP OTHER RECOMMENDATIONS 12/17/2017 Recommended Consults -- Oral Care Recommendations Oral care BID Other Recommendations Order thickener from pharmacy;Prohibited food (jello, ice cream, thin soups);Remove water pitcher   CHL IP FOLLOW UP RECOMMENDATIONS 12/17/2017 Follow up Recommendations (No Data)   CHL IP FREQUENCY AND DURATION 12/17/2017 Speech Therapy Frequency (ACUTE ONLY) min 2x/week Treatment Duration 2 weeks      CHL IP ORAL PHASE 12/17/2017 Oral Phase Impaired Oral - Pudding  Teaspoon -- Oral - Pudding Cup -- Oral - Honey Teaspoon -- Oral - Honey Cup -- Oral - Nectar Teaspoon -- Oral - Nectar Cup Premature spillage Oral - Nectar Straw -- Oral - Thin Teaspoon -- Oral - Thin Cup Premature spillage Oral - Thin Straw Premature spillage Oral - Puree WFL Oral - Mech Soft WFL Oral - Regular -- Oral - Multi-Consistency -- Oral - Pill -- Oral Phase - Comment --  CHL IP PHARYNGEAL PHASE 12/17/2017 Pharyngeal Phase Impaired Pharyngeal- Pudding Teaspoon -- Pharyngeal -- Pharyngeal- Pudding Cup -- Pharyngeal -- Pharyngeal- Honey Teaspoon -- Pharyngeal -- Pharyngeal- Honey Cup -- Pharyngeal -- Pharyngeal- Nectar Teaspoon -- Pharyngeal -- Pharyngeal- Nectar Cup Reduced epiglottic inversion;Reduced anterior laryngeal mobility;Reduced laryngeal elevation;Penetration/Aspiration during swallow Pharyngeal Material enters airway, remains ABOVE vocal cords and not ejected out Pharyngeal- Nectar Straw Reduced epiglottic inversion;Reduced anterior laryngeal mobility;Reduced laryngeal elevation;Penetration/Aspiration during swallow Pharyngeal Material enters airway, remains ABOVE vocal cords and not ejected out Pharyngeal- Thin Teaspoon -- Pharyngeal -- Pharyngeal- Thin Cup Reduced epiglottic inversion;Reduced anterior laryngeal mobility;Reduced laryngeal elevation;Penetration/Aspiration before swallow;Penetration/Apiration after swallow Pharyngeal Material enters airway, passes BELOW  cords without attempt by patient to eject out (silent aspiration) Pharyngeal- Thin Straw Reduced epiglottic inversion;Reduced anterior laryngeal mobility;Reduced laryngeal elevation;Penetration/Aspiration before swallow Pharyngeal Material enters airway, CONTACTS cords and not ejected out Pharyngeal- Puree Reduced epiglottic inversion;Reduced anterior laryngeal mobility;Reduced laryngeal elevation Pharyngeal -- Pharyngeal- Mechanical Soft Reduced epiglottic inversion;Reduced anterior laryngeal mobility;Reduced laryngeal elevation Pharyngeal -- Pharyngeal- Regular -- Pharyngeal -- Pharyngeal- Multi-consistency -- Pharyngeal -- Pharyngeal- Pill -- Pharyngeal -- Pharyngeal Comment --  CHL IP CERVICAL ESOPHAGEAL PHASE 12/17/2017 Cervical Esophageal Phase Impaired Pudding Teaspoon -- Pudding Cup -- Honey Teaspoon -- Honey Cup -- Nectar Teaspoon -- Nectar Cup Reduced cricopharyngeal relaxation;Esophageal backflow into cervical esophagus Nectar Straw Reduced cricopharyngeal relaxation;Esophageal backflow into cervical esophagus Thin Teaspoon -- Thin Cup Reduced cricopharyngeal relaxation;Esophageal backflow into the pharynx;Esophageal backflow into the larynx Thin Straw Reduced cricopharyngeal relaxation;Esophageal backflow into the pharynx Puree Reduced cricopharyngeal relaxation;Esophageal backflow into cervical esophagus Mechanical Soft Reduced cricopharyngeal relaxation;Esophageal backflow into cervical esophagus Regular -- Multi-consistency -- Pill -- Cervical Esophageal Comment -- CHL IP GO 09/21/2015 Functional Assessment Tool Used clinical judgement Functional Limitations Swallowing Swallow Current Status (Z6109) CJ Swallow Goal Status (U0454) CI Swallow Discharge Status (U9811) (None) Motor Speech Current Status (B1478) (None) Motor Speech Goal Status (G9562) (None) Motor Speech Goal Status (Z3086) (None) Spoken Language Comprehension Current Status (V7846) (None) Spoken Language Comprehension Goal Status (N6295)  (None) Spoken Language Comprehension Discharge Status (M8413) (None) Spoken Language Expression Current Status (K4401) (None) Spoken Language Expression Goal Status (U2725) (None) Spoken Language Expression Discharge Status [phone]) (None) Attention Current Status (I3474) (None) Attention Goal Status (Q5956) (None) Attention Discharge Status (L8756) (None) Memory Current Status (E3329) (None) Memory Goal Status (J1884) (None) Memory Discharge Status (Z6606) (None) Voice Current Status (T0160) (None) Voice Goal Status (F0932) (None) Voice Discharge Status (T5573) (None) Other Speech-Language Pathology Functional Limitation Current Status (U2025) (None) Other Speech-Language Pathology Functional Limitation Goal Status (K2706) (None) Other Speech-Language Pathology Functional Limitation Discharge Status [phone]) (None) Germain Osgood 12/17/2017, 12:21 PM  Germain Osgood, M.A. CCC-SLP [phone]              Scheduled Meds: . aspirin  150 mg Rectal Daily  . carvedilol  12.5 mg Oral BID WC  . furosemide  80 mg Oral Daily  . heparin  5,000 Units Subcutaneous Q8H  . hydrALAZINE  50 mg Oral Q8H  . insulin aspart  0-5 Units Subcutaneous  QHS  . insulin aspart  0-9 Units Subcutaneous TID WC  . isosorbide mononitrate  120 mg Oral Daily  . pantoprazole (PROTONIX) IV  40 mg Intravenous Q24H   Continuous Infusions: . azithromycin Stopped (12/16/17 1812)  . cefTRIAXone (ROCEPHIN)  IV Stopped (12/17/17 1034)     LOS: 4 days   Marylu Lund, MD [facility] Pager [phone]  If 7PM-7AM, please contact night-coverage [email or web address] Password [facility] 12/17/2017, 3:43 PM

## 2017-12-17 NOTE — Progress Notes (Signed)
Results of barium swallow and swallowing study reviewed.  Patient's wife indicates that Dr. Lucia Gaskins of ENT spoke with her yesterday evening, indicating that he did not feel that esophageal dilatation is needed.  On further questioning, it sounds as though the patient's main dysphagia symptoms occur in the morning, when he has a large amount of phlegm in his throat.  He states he can normally eat lunch and dinner without difficulty, although on occasion, perhaps once or twice a month, he will get a "choked" when trying to swallow and have to expectorate the food.  Impression and recommendations: I tend to agree with Dr. Lucia Gaskins, that esophageal dilatation is not likely to be needed, based both on the patient's radiographic findings and his history.  We will be on standby in the event that the patient's swallowing symptoms with intermittent food impaction during lunch or dinner become more frequent or troublesome, if Dr. Lucia Gaskins would like [place] (rather than he) to attempt dilatation.    We will sign off at this time, but would be happy to be called if you would like to discuss the patient's case in more detail, or if we can be of further assistance in his care.  Cleotis Nipper, M.D. Pager [phone] If no answer or after 5 PM call [phone]  Note: time at bedside 20'

## 2017-12-17 NOTE — Care Management Important Message (Signed)
Important Message  Patient Details  Name: Elonzo Sopp MRN: [id number] Date of Birth: [birth year]   Medicare Important Message Given:  Yes    Tequia Wolman 12/17/2017, 1:37 PM

## 2017-12-18 DIAGNOSIS — K56609 Unspecified intestinal obstruction, unspecified as to partial versus complete obstruction: Secondary | ICD-10-CM

## 2017-12-18 LAB — BASIC METABOLIC PANEL
Anion gap: 10 (ref 5–15)
BUN: 32 mg/dL — AB (ref 6–20)
CHLORIDE: 114 mmol/L — AB (ref 101–111)
CO2: 23 mmol/L (ref 22–32)
CREATININE: 2.34 mg/dL — AB (ref 0.61–1.24)
Calcium: 8.8 mg/dL — ABNORMAL LOW (ref 8.9–10.3)
GFR calc Af Amer: 27 mL/min — ABNORMAL LOW (ref >60)
GFR, EST NON AFRICAN AMERICAN: 23 mL/min — AB (ref >60)
GLUCOSE: 147 mg/dL — AB (ref 65–99)
Potassium: 4 mmol/L (ref 3.5–5.1)
Sodium: 147 mmol/L — ABNORMAL HIGH (ref 135–145)

## 2017-12-18 LAB — CBC
HEMATOCRIT: 31.6 % — AB (ref 39.0–52.0)
Hemoglobin: 10 g/dL — ABNORMAL LOW (ref 13.0–17.0)
MCH: 32.5 pg (ref 26.0–34.0)
MCHC: 31.6 g/dL (ref 30.0–36.0)
MCV: 102.6 fL — AB (ref 78.0–100.0)
Platelets: 126 10*3/uL — ABNORMAL LOW (ref 150–400)
RBC: 3.08 MIL/uL — ABNORMAL LOW (ref 4.22–5.81)
RDW: 14.2 % (ref 11.5–15.5)
WBC: 6.2 10*3/uL (ref 4.0–10.5)

## 2017-12-18 LAB — GLUCOSE, CAPILLARY
GLUCOSE-CAPILLARY: 157 mg/dL — AB (ref 65–99)
GLUCOSE-CAPILLARY: 213 mg/dL — AB (ref 65–99)
Glucose-Capillary: 169 mg/dL — ABNORMAL HIGH (ref 65–99)
Glucose-Capillary: 244 mg/dL — ABNORMAL HIGH (ref 65–99)
Glucose-Capillary: 116 mg/dL — ABNORMAL HIGH (ref 65–99)

## 2017-12-18 MED ORDER — AZITHROMYCIN 250 MG PO TABS
250.0000 mg | ORAL_TABLET | Freq: Every day | ORAL | Status: DC
  Administered 2017-12-19: 250 mg via ORAL
  Filled 2017-12-18: qty 1

## 2017-12-18 MED ORDER — PANTOPRAZOLE SODIUM 40 MG PO TBEC
40.0000 mg | DELAYED_RELEASE_TABLET | Freq: Every day | ORAL | Status: DC
  Administered 2017-12-18: 40 mg via ORAL
  Filled 2017-12-18: qty 1

## 2017-12-18 MED ORDER — ASPIRIN 81 MG PO CHEW
81.0000 mg | CHEWABLE_TABLET | Freq: Every day | ORAL | Status: DC
  Administered 2017-12-19: 81 mg via ORAL
  Filled 2017-12-18: qty 1

## 2017-12-18 MED ORDER — CEFUROXIME AXETIL 500 MG PO TABS
250.0000 mg | ORAL_TABLET | Freq: Every day | ORAL | Status: DC
  Administered 2017-12-18: 250 mg via ORAL
  Filled 2017-12-18: qty 1

## 2017-12-18 MED ORDER — OXYCODONE-ACETAMINOPHEN 5-325 MG PO TABS: 1.0000 | ORAL_TABLET | ORAL | Status: DC | PRN

## 2017-12-18 MED ORDER — FUROSEMIDE 40 MG PO TABS
40.0000 mg | ORAL_TABLET | Freq: Every evening | ORAL | Status: DC
  Administered 2017-12-18: 40 mg via ORAL
  Filled 2017-12-18: qty 1

## 2017-12-18 MED ORDER — FUROSEMIDE 80 MG PO TABS
80.0000 mg | ORAL_TABLET | Freq: Every day | ORAL | Status: DC
  Administered 2017-12-19: 80 mg via ORAL
  Filled 2017-12-18 (×2): qty 1

## 2017-12-18 NOTE — Progress Notes (Signed)
Advanced Heart Failure Rounding Note  PCP:  Primary Cardiologist: Dr. Aundra Dubin   Subjective:    Started back on po diuretics 12/17/17. Labs pending today.   Feeling better back on solid diet. No further chest pain.  Wife anxious and slightly frustrated as it still isn't clear to her what she should do when he has this pain at home.    Speech saw 12/13/16 for pharyngeal dysphagia marked by "explosive, prolonged coughing". GI consulted as well who recommended ENT. ENT plan for barium swallow today.    VQ scan 12/12/17 negative/"very low probability for PE" CT Chest/Abd/Pelvis 12/12/17 negative for any acute abnormality.  Objective:   Weight Range: 149 lb 12.8 oz (67.9 kg) Body mass index is 22.78 kg/m.   Vital Signs:   Temp:  [98 F (36.7 C)-98.6 F (37 C)] 98.6 F (37 C) (01/08 0700) Pulse Rate:  [59-72] 72 (01/08 0700) Resp:  [18-24] 24 (01/08 0700) BP: (133-171)/(52-56) 139/53 (01/08 0700) SpO2:  [95 %-96 %] 96 % (01/08 0700) Weight:  [149 lb 12.8 oz (67.9 kg)] 149 lb 12.8 oz (67.9 kg) (01/08 0700) Last BM Date: 12/17/17  Weight change: Filed Weights   12/16/17 0704 12/17/17 0500 12/18/17 0700  Weight: 150 lb 1.6 oz (68.1 kg) 151 lb 8 oz (68.7 kg) 149 lb 12.8 oz (67.9 kg)   Intake/Output:   Intake/Output Summary (Last 24 hours) at 12/18/2017 0747 Last data filed at 12/17/2017 2138 Gross per 24 hour  Intake 815 ml  Output 300 ml  Net 515 ml    Physical Exam    General: Elderly. NAD. Seated on edge of bed eating  HEENT: Normal Neck: Supple. No JVD.   Carotids 2+ bilat; no bruits. No thyromegaly or nodule noted. Cor: PMI nondisplaced. RRR, 2/6 SEM RUSB Lungs: Clear Abdomen: Soft, non-tender, non-distended, no HSM. No bruits or masses. +BS  Extremities: No cyanosis, clubbing, or rash. R and LLE no edema.  Neuro: Alert & orientedx3, cranial nerves grossly intact. moves all 4 extremities w/o difficulty. Affect pleasant   Telemetry   Sinus Brady/NSR 50-70s, personally  reviewed.   EKG    No new tracings.   Labs    CBC Recent Labs    12/17/17 0626  WBC 5.5  HGB 11.0*  HCT 35.9*  MCV 102.3*  PLT 944*   Basic Metabolic Panel Recent Labs    12/16/17 0505 12/17/17 0626  NA 141 146*  K 3.3* 3.9  CL 105 114*  CO2 28 23  GLUCOSE 156* 161*  BUN 56* 37*  CREATININE 2.77* 2.22*  CALCIUM 8.6* 8.5*   Liver Function Tests Recent Labs    12/17/17 0626  AST 17  ALT 13*  ALKPHOS 39  BILITOT 0.7  PROT 5.7*  ALBUMIN 2.6*   No results for input(s): LIPASE, AMYLASE in the last 72 hours. Cardiac Enzymes No results for input(s): CKTOTAL, CKMB, CKMBINDEX, TROPONINI in the last 72 hours.  BNP: BNP (last 3 results) Recent Labs    07/05/17 1517 07/11/17 1734 12/12/17 1335  BNP 529.6* 281.4* 384.0*    ProBNP (last 3 results) No results for input(s): PROBNP in the last 8760 hours.   D-Dimer No results for input(s): DDIMER in the last 72 hours. Hemoglobin A1C No results for input(s): HGBA1C in the last 72 hours. Fasting Lipid Panel No results for input(s): CHOL, HDL, LDLCALC, TRIG, CHOLHDL, LDLDIRECT in the last 72 hours. Thyroid Function Tests No results for input(s): TSH, T4TOTAL, T3FREE, THYROIDAB in the last 72  hours.  Invalid input(s): FREET3  Other results:   Imaging    Dg Swallowing Func-speech Pathology  Result Date: 12/17/2017 Objective Swallowing Evaluation: Type of Study: MBS-Modified Barium Swallow Study  Patient Details Name: [name] [name] MRN: [id number] Date of Birth: Nov 04, 1926 Today's Date: 12/17/2017 Time: SLP Start Time (ACUTE ONLY): 1601 -SLP Stop Time (ACUTE ONLY): 1103 SLP Time Calculation (min) (ACUTE ONLY): 21 min Past Medical History: Past Medical History: Diagnosis Date . Anemia  . Anxiety  . Aortic stenosis   a. s/p AVR with 29mm Edwards pericardial valve 02/07/12 - post-op course complicated by pleural effusion requring thoracentesis/leg cellulitis 03/2012. . Baker's cyst 07/23/12  Incidental finding of LE  venous dopplers . Blood transfusion   NO REACTION TO TRANSFUSION . CAD (coronary artery disease)   a. s/p NSTEMI 12/2011:  LHC - Ostial left main 20%, ostial LAD 50%, mid 60-70%, ostial D1 40% and mid 40%, D2 70%, ostial circumflex occluded, ostial RCA 80-90%, LVEDP was 42. b.  s/p CABG x 3 at time of AVR (LiMA-LAD, SVG-2nd daigonal, SVG-PDA) 02/07/12 (post-op course noted above). . Cancer [facility]) '90's  Colon . Cataract   right eye, hx of . Cellulitis   a. RLE cellulitis 2 months post-operatively after AVR/CABG - serratia marcessans, tx with I&D/antibiotics . CHF (congestive heart failure) ([place])  . Chronic kidney disease  . Chronic systolic heart failure ([place])   a. TEE 01/2012: EF 25-30%, diffuse hypokinesis. b. Not on ACEI due to renal insufficiency.;  c. follow up  echo 08/06/12: EF 25%, mod diast dysfxn, AVR ok, mild [name], mod LAE, mild RAE, mild to mod RV systolic dysfunction . Diabetes mellitus   borderline . Flash pulmonary edema ([place])   Post-cath 12/2011, went into acute pulm edema requiring IV lasix and intubation . GERD (gastroesophageal reflux disease)  . History of colon cancer   s/p colon resection . HLD (hyperlipidemia)  . HTN (hypertension)   primary, Dr. Hollace Kinnier . Ischemic cardiomyopathy  . Mitral regurgitation   Moderate by TEE 01/2012 . Myocardial infarction ([place]) 12/2011 . Pleural effusion   a. post-operatively after AVR/CABG s/p thoracentesis 03/2012 yielding 1L serosanguinous fluid. . Stroke ([place]) 10/01  RIght Leg weakness Past Surgical History: Past Surgical History: Procedure Laterality Date . AORTIC VALVE REPLACEMENT  02/07/2012  Procedure: AORTIC VALVE REPLACEMENT (AVR);  Surgeon: Gaye Pollack, MD;  Location: [place];  Service: Open Heart Surgery;  Laterality: N/A; . CARDIAC CATHETERIZATION    1.3.13  stopped breathing, put on ventilator for 5-6 days . CATARACT EXTRACTION    rt . CHEST TUBE INSERTION  07/24/2012  Procedure: INSERTION PLEURAL DRAINAGE CATHETER;  Surgeon: Gaye Pollack, MD;  Location:  [place];  Service: Thoracic;  Laterality: Left; . COLON RESECTION  1996 . COLONOSCOPY   . CORONARY ARTERY BYPASS GRAFT  02/07/2012  Procedure: CORONARY ARTERY BYPASS GRAFTING (CABG);  Surgeon: Gaye Pollack, MD;  Location: [place];  Service: Open Heart Surgery;  Laterality: N/A;  CABG x three; using right leg greater saphenous vein harvested endoscopically . ERCP N/A 07/08/2014  Procedure: ENDOSCOPIC RETROGRADE CHOLANGIOPANCREATOGRAPHY (ERCP);  Surgeon: Missy Sabins, MD;  Location: [facility] ENDOSCOPY;  Service: Endoscopy;  Laterality: N/A; . ESOPHAGEAL DILATION   . ESOPHAGOGASTRODUODENOSCOPY N/A 07/03/2014  Procedure: ESOPHAGOGASTRODUODENOSCOPY (EGD);  Surgeon: Arta Silence, MD;  Location: [facility] ENDOSCOPY;  Service: Endoscopy;  Laterality: N/A; . ESOPHAGOSCOPY WITH DILITATION N/A 07/07/2014  Procedure: ESOPHAGOSCOPY WITH DILITATION/SAVARY DILATOR;  Surgeon: Rozetta Nunnery, MD;  Location: [place];  Service: ENT;  Laterality: N/A; . EYE SURGERY  2009  cataract removed from right eye . INGUINAL HERNIA REPAIR Right 09/13/2015  Procedure: OPEN RIGHT INGUINAL HERNIA;  Surgeon: Mickeal Skinner, MD;  Location: [place];  Service: General;  Laterality: Right; . INSERTION OF MESH Right 09/13/2015  Procedure: INSERTION OF MESH;  Surgeon: Mickeal Skinner, MD;  Location: [place];  Service: General;  Laterality: Right; . LEFT HEART CATHETERIZATION WITH CORONARY ANGIOGRAM N/A 12/13/2011  Procedure: LEFT HEART CATHETERIZATION WITH CORONARY ANGIOGRAM;  Surgeon: Peter M Martinique, MD;  Location: [facility] CATH LAB;  Service: Cardiovascular;  Laterality: N/A; . REMOVAL OF PLEURAL DRAINAGE CATHETER  09/27/2012  Procedure: REMOVAL OF PLEURAL DRAINAGE CATHETER;  Surgeon: Gaye Pollack, MD;  Location: [place];  Service: Thoracic;  Laterality: Left; . TALC PLEURODESIS  08/30/2012  Procedure: Pietro Cassis;  Surgeon: Gaye Pollack, MD;  Location: [place];  Service: Thoracic;  Laterality: Left;  INSERTION OF TALC VIA LEFT PLEURX . TALC PLEURODESIS  09/12/2012   Procedure: Pietro Cassis;  Surgeon: Gaye Pollack, MD;  Location: [place];  Service: Thoracic;  Laterality: Left; . TONSILLECTOMY   HPI: [name] [name] a 84 y.o.malewith medical history significant ofcoronary artery disease, dysphagia, esophageal stricture with dilation, CABG, bioprosthetic AVR, anemia, chronic kidney disease stage IV, chronic systolic heart failure and colon cancer presented today with complaints of sudden onset of chest pain. July 24th 2015 ERCP revealed "very tight cervical esophageal web and unable to traverse endoscope, therefore ERCP procedure aborted." EGD done under general anethesia for esophageal dilation. MBS 2016 incomplete epiglottic closure and backflow from cervical esophagus with aspirated thin and penetrated nectar; D2/thin recommended. BSE 10/24/15 regular/thin recommended. Chest Xray 1/2 concerning for pna however chest CT 12/12/17 no acute abnormality identified in the chest, abdomen, or pelvis.  Subjective: pt alert, cooperative Assessment / Plan / Recommendation CHL IP CLINICAL IMPRESSIONS 12/17/2017 Clinical Impression Pt has a mild oropharyngeal dysphagia with more prominent cervical esophageal issues. He has intermittent penetration and silent aspiration into the airway before the swallow with larger boluses of thin liquids secondary to premature spillage. This is reduced with smaller cup sips, but pt also has retrograde flow from the cervical esophagus back into the pharynx and larynx, requiring cues for more effortful cough to clear this penetration. He has improved oral containment when consuming nectar thick liquids via straw, but all consistencies tested have slow clearance through the cervical esophagus. Heavier boluses still had backflow within the UES, but did not reach all the way back into the larynx. Although pt does say that he can sense when boluses "come back up", he does not show any signs to suggest that he is aware of any airway compromise. SLP provided  Min cues for use of chin tuck, but this did not improve airway protection or clearance of mild vallecular residue. Recommend initiation of Dys 2 diet and nectar thick liquids by straw, using intermittent throat clearing and esophageal/aspiration precautions. Recommend additional f/u with ENT to address cervical esophageal issues. SLP Visit Diagnosis Dysphagia, pharyngoesophageal phase (R13.14) Attention and concentration deficit following -- Frontal lobe and executive function deficit following -- Impact on safety and function Moderate aspiration risk   CHL IP TREATMENT RECOMMENDATION 12/17/2017 Treatment Recommendations Therapy as outlined in treatment plan below   Prognosis 12/17/2017 Prognosis for Safe Diet Advancement Fair Barriers to Reach Goals Time post onset Barriers/Prognosis Comment -- CHL IP DIET RECOMMENDATION 12/17/2017 SLP Diet Recommendations Dysphagia 2 (Fine chop) solids;Nectar thick liquid Liquid Administration via Straw Medication  Administration Crushed with puree Compensations Slow rate;Small sips/bites;Follow solids with liquid;Clear throat intermittently Postural Changes Remain semi-upright after after feeds/meals (Comment);Seated upright at 90 degrees   CHL IP OTHER RECOMMENDATIONS 12/17/2017 Recommended Consults -- Oral Care Recommendations Oral care BID Other Recommendations Order thickener from pharmacy;Prohibited food (jello, ice cream, thin soups);Remove water pitcher   CHL IP FOLLOW UP RECOMMENDATIONS 12/17/2017 Follow up Recommendations (No Data)   CHL IP FREQUENCY AND DURATION 12/17/2017 Speech Therapy Frequency (ACUTE ONLY) min 2x/week Treatment Duration 2 weeks      CHL IP ORAL PHASE 12/17/2017 Oral Phase Impaired Oral - Pudding Teaspoon -- Oral - Pudding Cup -- Oral - Honey Teaspoon -- Oral - Honey Cup -- Oral - Nectar Teaspoon -- Oral - Nectar Cup Premature spillage Oral - Nectar Straw -- Oral - Thin Teaspoon -- Oral - Thin Cup Premature spillage Oral - Thin Straw Premature spillage Oral - Puree  WFL Oral - Mech Soft WFL Oral - Regular -- Oral - Multi-Consistency -- Oral - Pill -- Oral Phase - Comment --  CHL IP PHARYNGEAL PHASE 12/17/2017 Pharyngeal Phase Impaired Pharyngeal- Pudding Teaspoon -- Pharyngeal -- Pharyngeal- Pudding Cup -- Pharyngeal -- Pharyngeal- Honey Teaspoon -- Pharyngeal -- Pharyngeal- Honey Cup -- Pharyngeal -- Pharyngeal- Nectar Teaspoon -- Pharyngeal -- Pharyngeal- Nectar Cup Reduced epiglottic inversion;Reduced anterior laryngeal mobility;Reduced laryngeal elevation;Penetration/Aspiration during swallow Pharyngeal Material enters airway, remains ABOVE vocal cords and not ejected out Pharyngeal- Nectar Straw Reduced epiglottic inversion;Reduced anterior laryngeal mobility;Reduced laryngeal elevation;Penetration/Aspiration during swallow Pharyngeal Material enters airway, remains ABOVE vocal cords and not ejected out Pharyngeal- Thin Teaspoon -- Pharyngeal -- Pharyngeal- Thin Cup Reduced epiglottic inversion;Reduced anterior laryngeal mobility;Reduced laryngeal elevation;Penetration/Aspiration before swallow;Penetration/Apiration after swallow Pharyngeal Material enters airway, passes BELOW cords without attempt by patient to eject out (silent aspiration) Pharyngeal- Thin Straw Reduced epiglottic inversion;Reduced anterior laryngeal mobility;Reduced laryngeal elevation;Penetration/Aspiration before swallow Pharyngeal Material enters airway, CONTACTS cords and not ejected out Pharyngeal- Puree Reduced epiglottic inversion;Reduced anterior laryngeal mobility;Reduced laryngeal elevation Pharyngeal -- Pharyngeal- Mechanical Soft Reduced epiglottic inversion;Reduced anterior laryngeal mobility;Reduced laryngeal elevation Pharyngeal -- Pharyngeal- Regular -- Pharyngeal -- Pharyngeal- Multi-consistency -- Pharyngeal -- Pharyngeal- Pill -- Pharyngeal -- Pharyngeal Comment --  CHL IP CERVICAL ESOPHAGEAL PHASE 12/17/2017 Cervical Esophageal Phase Impaired Pudding Teaspoon -- Pudding Cup -- Honey  Teaspoon -- Honey Cup -- Nectar Teaspoon -- Nectar Cup Reduced cricopharyngeal relaxation;Esophageal backflow into cervical esophagus Nectar Straw Reduced cricopharyngeal relaxation;Esophageal backflow into cervical esophagus Thin Teaspoon -- Thin Cup Reduced cricopharyngeal relaxation;Esophageal backflow into the pharynx;Esophageal backflow into the larynx Thin Straw Reduced cricopharyngeal relaxation;Esophageal backflow into the pharynx Puree Reduced cricopharyngeal relaxation;Esophageal backflow into cervical esophagus Mechanical Soft Reduced cricopharyngeal relaxation;Esophageal backflow into cervical esophagus Regular -- Multi-consistency -- Pill -- Cervical Esophageal Comment -- CHL IP GO 09/21/2015 Functional Assessment Tool Used clinical judgement Functional Limitations Swallowing Swallow Current Status (X3235) CJ Swallow Goal Status (T7322) CI Swallow Discharge Status (G2542) (None) Motor Speech Current Status (H0623) (None) Motor Speech Goal Status (J6283) (None) Motor Speech Goal Status (T5176) (None) Spoken Language Comprehension Current Status (H6073) (None) Spoken Language Comprehension Goal Status (X1062) (None) Spoken Language Comprehension Discharge Status [phone]) (None) Spoken Language Expression Current Status (I6270) (None) Spoken Language Expression Goal Status (J5009) (None) Spoken Language Expression Discharge Status [phone]) (None) Attention Current Status (X9371) (None) Attention Goal Status (I9678) (None) Attention Discharge Status (L3810) (None) Memory Current Status (F7510) (None) Memory Goal Status (C5852) (None) Memory Discharge Status (D7824) (None) Voice Current Status (M3536) (None) Voice Goal Status (R4431) (None) Voice Discharge Status (  G9173) (None) Other Speech-Language Pathology Functional Limitation Current Status [phone]) (None) Other Speech-Language Pathology Functional Limitation Goal Status (N0272) (None) Other Speech-Language Pathology Functional Limitation Discharge Status  [phone]) (None) Germain Osgood 12/17/2017, 12:21 PM  Germain Osgood, M.A. CCC-SLP [phone]              Medications:     Scheduled Medications: . aspirin  150 mg Rectal Daily  . carvedilol  12.5 mg Oral BID WC  . furosemide  80 mg Oral Daily  . heparin  5,000 Units Subcutaneous Q8H  . hydrALAZINE  50 mg Oral Q8H  . insulin aspart  0-5 Units Subcutaneous QHS  . insulin aspart  0-9 Units Subcutaneous TID WC  . isosorbide mononitrate  120 mg Oral Daily  . pantoprazole (PROTONIX) IV  40 mg Intravenous Q24H    Infusions: . azithromycin Stopped (12/17/17 1934)  . cefTRIAXone (ROCEPHIN)  IV Stopped (12/17/17 1034)    PRN Medications: acetaminophen, gi cocktail, hydrALAZINE, morphine injection, nitroGLYCERIN, ondansetron (ZOFRAN) IV, Glenvil  Patient Profile   [name] [name] is an86 y.o. male with a PMHx of CAD, CABG, bioprosthetic AVR, anemia, CKD stage 4, chronic systolic heart failure. He is not on an ACE or ARB due renal failure.   Brought in via EMS with recurrent CP, not relieved by NTG.   Assessment/Plan   1. Chest pain => Pleuritic  => ? Infectious process +/- Dysphagia - Atypical. Affecting his left chest and ? Upper abdomen.  Not worse with palpation but worse with respirations.  - D-dimer 0.59. VQ scan negative.  - CXR showed LLL PNA but CT chest/abdomen/pelvis did not show PNA or other acute abnormality.   -Remains on rocephin and azithromycin until 12/20/17. - Now questioning component of dysphagia, ?possible aspiration though infiltrate not seen on CT. GI following and ENT to see with history of same. No change.  2. CAD: S/p CABG - Has multiple ED visits and admissions with CP. Troponin negative and usually improves with lasix.  - As previously discussed, with absence of definite MI, would avoid cardiac cath with CKD stage 4 and continue to treat symptomatically (therefore, will not do stress test). - Continue ASA 81.  - No statin with  myalgias. - Back on po meds, so resuming meds as tolerated.  -3. ARF on CKD stage IV:  - Creatinine peaked 3.8>2.2. BMET pending today.  4. Aortic valve replacement: Bioprosthetic. - Valve looked stable on 7/18 echo. No change.  5. Chronic systolic CHF: Ischemic cardiomyopathy.  - EF 25-30% on 7/18 echo, down from EF 40-45%. - Continue lasix 80 mg daily.  - Continue hydralazine 50 mg TID - Continue imdur 120 mg daily - Continue coreg 12.5 mg BID.  - Not on ACEI because of CKD.  - BP remains slightly elevated this am, but has only had 1-2 doses back on his cardiac medications, so will let ride for now.  6. Delirium - Much improved.  7. Deconditioning - PT seeing and recommending rehab. Wife states he would not want this at all.  - He is DNR. May need to consider palliative care/hospice if not stable at home. No change.  8. Oropharyngeal dysphagia: Has had upper esophageal dilatation in the past by ENT. Speech saw 12/13/16 for pharyngeal dysphagia marked by "explosive, prolonged coughing". ENT consulted. - Barium swallow 12/17/17 with mild oropharyngeal dysphagia with more prominent cervical esophageal issues. Moderate aspiration risk. - Speech therapy recommended.   Pt has HF follow up scheduled for 12/26/17.  Medication  concerns reviewed with patient and pharmacy team. Barriers identified: None at this time.  Length of Stay: 5  Annamaria Helling  12/18/2017, 7:47 AM  Advanced Heart Failure Team Pager [phone] (M-F; 7a - 4p)  Please contact [facility] for night-coverage after hours (4p -7a ) and weekends on [email or web address]  Patient seen with PA, agree with the above note.  Creatinine fairly stable, BP lower.  Continue currrent meds, would leave Lasix at 80 mg daily for now, would send home on Lasix 80 qam/40 qpm.   It is possible that his sudden dyspnea/pleuritic chest pain episodes are due to aspiration events rather than primary cardiac events.   He will need followup  in CHF clinic after discharge.   Loralie Champagne 12/18/2017 1:18 PM

## 2017-12-18 NOTE — Progress Notes (Signed)
  Speech Language Pathology Treatment: Dysphagia  Patient Details Name: [name] [name] MRN: [id number] DOB: 1926-09-27 Today's Date: 12/18/2017 Time: 2951-8841 SLP Time Calculation (min) (ACUTE ONLY): 31 min  Assessment / Plan / Recommendation Clinical Impression  Pt on SLP's caseload to see today and paged by RN family requesting additional education prior to possible discharge tomorrow. Reviewed results of yesterday's MBS with pt and 2 daughters as well as barium esophagram report 1/6. Educated re: recommendations for thickener (family familiar with thickenUp) and answered questions related to Dys 2 diet (provided handouts about Dys 2 texture foods). Pt may benefit from home health ST (family in agreement) to further provide education observation with recommendations. Discussed possibility of repeat MBS (outpatient) in 4-6 weeks for potential improvements to return to dysphagia baseline.     HPI HPI: [name] [name] a 84 y.o.malewith medical history significant ofcoronary artery disease, dysphagia, esophageal stricture with dilation, CABG, bioprosthetic AVR, anemia, chronic kidney disease stage IV, chronic systolic heart failure and colon cancer presented today with complaints of sudden onset of chest pain. July 24th 2015 ERCP revealed "very tight cervical esophageal web and unable to traverse endoscope, therefore ERCP procedure aborted." EGD done under general anethesia for esophageal dilation. MBS 2016 incomplete epiglottic closure and backflow from cervical esophagus with aspirated thin and penetrated nectar; D2/thin recommended. BSE 10/24/15 regular/thin recommended. Chest Xray 1/2 concerning for pna however chest CT 12/12/17 no acute abnormality identified in the chest, abdomen, or pelvis.      SLP Plan  Continue with current plan of care       Recommendations  Diet recommendations: Dysphagia 2 (fine chop);Nectar-thick liquid Liquids provided via: Straw Medication Administration:  Crushed with puree Supervision: Patient able to self feed Compensations: Slow rate;Small sips/bites;Follow solids with liquid;Clear throat intermittently Postural Changes and/or Swallow Maneuvers: Seated upright 90 degrees;Upright 30-60 min after meal                Oral Care Recommendations: Oral care BID Follow up Recommendations: Home health SLP SLP Visit Diagnosis: Dysphagia, pharyngoesophageal phase (R13.14) Plan: Continue with current plan of care       GO                [name] [name] 12/18/2017, 4:41 PM  [name] [name] [name] [name] M.Ed [employer] [phone]

## 2017-12-18 NOTE — Progress Notes (Signed)
Patient seen again at request of hospitalist physician because of ongoing questions by the family about his recurring episodes of nonspecific chest and abdominal pain.  Incidentally, when I came in over the lunch hour, he was eating ice cream without difficulty.  Concerning his chest pain, it is the same pain that his wife described yesterday.  Further history indicates that it began this summer, and has recurred on an episodic, increasingly frequent, increasingly intense basis, without any obvious provocative factors or pattern to the symptoms.  It is not associated with meals, rest, activity, fasting, nor any associated symptoms such as nausea, fevers, defecation, eructation, etc. on any consistent basis.  The pain begins in the left parasternal area and radiates down to the left perigastric area, whereupon it migrates across the upper abdomen.  It is associated with visible "quivering" of the abdominal muscles in the epigastric or hypogastric area.  Previous cardiac evaluation has been unrevealing (patient is status post bypass surgery and aortic valve replacement), and a trial of nitroglycerin and Lasix has not been beneficial in the past.  Usually, these episodes last several hours, although one recently lasted all day.  The patient rates the pain as roughly "7" on a scale of 10 in terms of severity.  When it occurs, he is basically incapacitated, and just sits in the recliner.  It is not helped by massage or physical splinting.  The most recent episode actually occurred here in the hospital, the day after he was admitted.  On exam, the patient is sitting up on the side of the bed, eating ice cream, in no distress at the present time.  Impression: The character of the pain is entirely nonspecific.  My best judgment is that it is somatic rather than visceral in origin, that is, arising from the body wall rather than from internal organs.  In favor this is the absence of association with GI tract  activities such as meals or defecation, and the observation of visible muscular fasciculations when the episodes occur.  It does not sound like a classic neuropathy or radiculopathy, in the distribution of pain does not follow any obvious neural pattern.  It does not sound as though there is any "trigger point" to go along with fibromyalgia or fibromyositis.  The patient's daughter was wondering about esophageal spasm, but the pain is not in the midline and there was no evidence of spasm on his barium swallow.  Moreover, it did not respond to nitroglycerin.  (However, on one occasion the patient did seem to get gradually better after drinking some hot chocolate.)  Recommendations:   1.  I suggested they may want to take up the situation with his primary physician, since it almost certainly is not a GI tract problem.    2. Realistically, in view of his advanced age, neither the patient nor his wife want him to have to go through lots of testing which may be unrevealing, for example, referrals to rheumatology, neurosurgery, neurology, orthopedics, etc., etc.  Such testing could be quite burdensome for the patient to go through, and would not likely lead to a specific diagnosis.    3. Therefore, I think empiric therapy makes the most sense.  If drinking warm liquids helps in the future, certainly he should continue to do that, although I doubt that will offer consistent relief.    4. If it does not, I would then favor trial of nonspecific pain medication, such as tramadol or Vicodin to see if that helps.  5. If the patient remains symptomatic, perhaps some trials of modalities of therapy such as acupuncture could be considered, since it would be safe and possibly effective, especially for somatic pain.  6. I have made the patient a follow-up appointment with his primary gastroenterologist, Dr. Wonda Horner, for January 22 at 2:15 PM.  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  Time spent with patient/family 14'  Cleotis Nipper, M.D. Pager [phone] If no answer or after 5 PM call [phone]

## 2017-12-18 NOTE — Plan of Care (Signed)
Pt. Expressing no issues with elimination.

## 2017-12-18 NOTE — Progress Notes (Signed)
PROGRESS NOTE    [name] [name]  KKX:[id number] DOB: 04-Oct-1926 DOA: 12/12/2017 PCP: Gayland Curry, DO    Brief Narrative:  84 y.o. male with medical history significant of coronary artery disease, CABG, bioprosthetic AVR, anemia, chronic kidney disease stage IV, chronic systolic heart failure and colon cancer presented today with complaints of sudden onset of chest pain.  Patient states that he started having chest pain early this morning after he woke up, mostly left-sided, sharp in nature, intermittent, up to 7 out of 10 intensity with radiation to the whole chest and his abdomen.  He also complained of some shortness of breath.  Chest pain was noted with nitroglycerin.  Patient also complains of intermittent cough but no productive sputum, fever, nausea, vomiting, diarrhea, chest trauma.  Patient denied any recent weight gain or decreased urine output.  Assessment & Plan:   Active Problems:   Chronic combined systolic and diastolic heart failure, NYHA class 2 (HCC)   Chronic kidney disease (CKD), stage IV (severe) (HCC)   Diabetes mellitus type 2, controlled, with complications (HCC)   Chest pain   Community acquired pneumonia   Pneumonia  Non-cardiac Chest pain -Unrelieved by nitroglycerin -Suspect esophogeal in etiology. CT chest without evidence on PNA -On empiric antibiotics for PNA. -VQ without evidence of PE -2d echo with EF of 40-45% -Chart reviewed. Patient with hx of proximal esophageal strictures s/p prior dilation in 2015. In room, pt noted to have difficulty swallowing ice chip and attempted to "fish" ice chip out -Consulted GI and ENT given hx of proximal esophageal stricture. Discussed with GI. No indication for dilation at this time -Tolerating dysphagia 2 diet  Aspiration Pneumonia -Continue Rocephin and Zithromax -Continue with O2 as tolerated -Patient with witnessed aspiration of contrast into airway per Radiology during barium swallow which was stopped  prematurely secondary to aspiration -SLP following - now cleared for dysphagia 2 diet -Will transition to PO azithromycin and ceftin  Coronary artery disease status post CABG -History of recurrent chest pain with multiple ED visits and admissions -Initial troponin negative.  Cycle troponins.  Cardiology following. -Continue aspirin, Coreg, Imdur, hydralazine as tolerated -remains stable currently  Chronic systolic CHF with ischemic cardiomyopathy with history of bioprosthetic AVR -EF of 25-30% on 7/18. -Recently noted to be clinically dehydrated on exam with worsening renal function in the setting of diuresis with poor PO intake -Lasix on hold with gentle IVF hydration -Renal function now improved with IVF  CKD stage IV -Monitor renal function while on IV diuretics -Cr has improved with IVF -Repeat bmet in AM  Diabetes mellitus type 2 -Holding oral meds. Sliding scale insulin. Hemoglobin A1c noted to be 6.1  HTN -BP now much improved since resuming PO meds -Cont current regimen   Lower quadrant abd pain -Abd xray reviewed. Findings suggestive of constipation -Good results noted with soap suds enema  DVT prophylaxis: Heparin subQ Code Status: DNR Family Communication: Pt in room, family at bedside Disposition Plan: Uncertain at this time  Consultants:   GI  ENT  Cardiology  Procedures:     Antimicrobials: Anti-infectives (From admission, onward)   Start     Dose/Rate Route Frequency Ordered Stop   12/19/17 1000  azithromycin (ZITHROMAX) tablet 250 mg     250 mg Oral Daily 12/18/17 1422     12/18/17 1700  cefUROXime (CEFTIN) tablet 250 mg     250 mg Oral Daily with supper 12/18/17 1422     12/14/17 1630  azithromycin (ZITHROMAX) 250 mg  in dextrose 5 % 125 mL IVPB  Status:  Discontinued    Comments:  Dysphagia. Cannot take pills   250 mg 125 mL/hr over 60 Minutes Intravenous Every 24 hours 12/14/17 1618 12/18/17 1422   12/13/17 0800  cefTRIAXone (ROCEPHIN)  1 g in dextrose 5 % 50 mL IVPB  Status:  Discontinued     1 g 100 mL/hr over 30 Minutes Intravenous Every 24 hours 12/12/17 1959 12/18/17 1422   12/13/17 0800  azithromycin (ZITHROMAX) tablet 500 mg  Status:  Discontinued     500 mg Oral Every 24 hours 12/12/17 1959 12/14/17 1618   12/12/17 1630  cefTRIAXone (ROCEPHIN) 1 g in dextrose 5 % 50 mL IVPB     1 g 100 mL/hr over 30 Minutes Intravenous  Once 12/12/17 1622 12/12/17 1736   12/12/17 1630  azithromycin (ZITHROMAX) tablet 500 mg     500 mg Oral  Once 12/12/17 1622 12/12/17 1705      Subjective: States feeling better  Objective: Vitals:   12/17/17 1633 12/17/17 2009 12/18/17 0700 12/18/17 0900  BP: (!) 171/54 (!) 133/52 (!) 139/53 (!) 123/41  Pulse: 70 65 72 66  Resp:  18 (!) 24 18  Temp:  98 F (36.7 C) 98.6 F (37 C) 98.1 F (36.7 C)  TempSrc:  Oral Oral Oral  SpO2:  95% 96% 98%  Weight:   67.9 kg (149 lb 12.8 oz)   Height:        Intake/Output Summary (Last 24 hours) at 12/18/2017 1525 Last data filed at 12/18/2017 0834 Gross per 24 hour  Intake 1125 ml  Output 100 ml  Net 1025 ml   Filed Weights   12/16/17 0704 12/17/17 0500 12/18/17 0700  Weight: 68.1 kg (150 lb 1.6 oz) 68.7 kg (151 lb 8 oz) 67.9 kg (149 lb 12.8 oz)    Examination: General exam: Awake, laying in bed, in nad Respiratory system: Normal respiratory effort, no wheezing Cardiovascular system: regular rate, s1, s2 Gastrointestinal system: Soft, nondistended, positive BS Central nervous system: CN2-12 grossly intact, strength intact Extremities: Perfused, no clubbing Skin: Normal skin turgor, no notable skin lesions seen Psychiatry: Mood normal // no visual hallucinations   Data Reviewed: I have personally reviewed following labs and imaging studies  CBC: Recent Labs  Lab 12/12/17 1335 12/13/17 0807 12/17/17 0626 12/18/17 0649  WBC 6.3 15.8* 5.5 6.2  NEUTROABS  --  13.5*  --   --   HGB 11.4* 13.3 11.0* 10.0*  HCT 35.0* 40.3 35.9* 31.6*    MCV 97.8 100.5* 102.3* 102.6*  PLT 139* 148* 120* 712*   Basic Metabolic Panel: Recent Labs  Lab 12/14/17 0552 12/15/17 0519 12/16/17 0505 12/17/17 0626 12/18/17 0649  NA 143 143 141 146* 147*  K 4.0 3.6 3.3* 3.9 4.0  CL 101 101 105 114* 114*  CO2 34* 29 28 23 23   GLUCOSE 130* 123* 156* 161* 147*  BUN 77* 70* 56* 37* 32*  CREATININE 3.84* 3.39* 2.77* 2.22* 2.34*  CALCIUM 8.9 8.8* 8.6* 8.5* 8.8*   GFR: Estimated Creatinine Clearance: 20.6 mL/min (A) (by C-G formula based on SCr of 2.34 mg/dL (H)). Liver Function Tests: Recent Labs  Lab 12/17/17 0626  AST 17  ALT 13*  ALKPHOS 39  BILITOT 0.7  PROT 5.7*  ALBUMIN 2.6*   No results for input(s): LIPASE, AMYLASE in the last 168 hours. No results for input(s): AMMONIA in the last 168 hours. Coagulation Profile: No results for input(s): INR, PROTIME in the  last 168 hours. Cardiac Enzymes: Recent Labs  Lab 12/12/17 1815 12/12/17 2344 12/13/17 0536  TROPONINI 0.03* 0.03* 0.04*   BNP (last 3 results) No results for input(s): PROBNP in the last 8760 hours. HbA1C: No results for input(s): HGBA1C in the last 72 hours. CBG: Recent Labs  Lab 12/17/17 1202 12/17/17 1650 12/17/17 2132 12/18/17 0732 12/18/17 1145  GLUCAP 201* 197* 194* 169* 244*   Lipid Profile: No results for input(s): CHOL, HDL, LDLCALC, TRIG, CHOLHDL, LDLDIRECT in the last 72 hours. Thyroid Function Tests: No results for input(s): TSH, T4TOTAL, FREET4, T3FREE, THYROIDAB in the last 72 hours. Anemia Panel: No results for input(s): VITAMINB12, FOLATE, FERRITIN, TIBC, IRON, RETICCTPCT in the last 72 hours. Sepsis Labs: No results for input(s): PROCALCITON, LATICACIDVEN in the last 168 hours.  Recent Results (from the past 240 hour(s))  Culture, blood (routine x 2)     Status: None   Collection Time: 12/12/17  6:15 PM  Result Value Ref Range Status   Specimen Description BLOOD BLOOD RIGHT FOREARM  Final   Special Requests   Final    BOTTLES  DRAWN AEROBIC AND ANAEROBIC Blood Culture adequate volume   Culture NO GROWTH 5 DAYS  Final   Report Status 12/17/2017 FINAL  Final  Culture, blood (routine x 2)     Status: None   Collection Time: 12/12/17  6:18 PM  Result Value Ref Range Status   Specimen Description BLOOD LEFT ANTECUBITAL  Final   Special Requests IN PEDIATRIC BOTTLE Blood Culture adequate volume  Final   Culture NO GROWTH 5 DAYS  Final   Report Status 12/17/2017 FINAL  Final     Radiology Studies: Dg Swallowing Func-speech Pathology  Result Date: 12/17/2017 Objective Swallowing Evaluation: Type of Study: MBS-Modified Barium Swallow Study  Patient Details Name: Vitor Overbaugh MRN: [id number] Date of Birth: [birth year] Today's Date: 12/17/2017 Time: SLP Start Time (ACUTE ONLY): 1042 -SLP Stop Time (ACUTE ONLY): 1103 SLP Time Calculation (min) (ACUTE ONLY): 21 min Past Medical History: Past Medical History: Diagnosis Date . Anemia  . Anxiety  . Aortic stenosis   a. s/p AVR with 52mm Edwards pericardial valve 02/07/12 - post-op course complicated by pleural effusion requring thoracentesis/leg cellulitis 03/2012. . Baker's cyst 07/23/12  Incidental finding of LE venous dopplers . Blood transfusion   NO REACTION TO TRANSFUSION . CAD (coronary artery disease)   a. s/p NSTEMI 12/2011:  LHC - Ostial left main 20%, ostial LAD 50%, mid 60-70%, ostial D1 40% and mid 40%, D2 70%, ostial circumflex occluded, ostial RCA 80-90%, LVEDP was 42. b.  s/p CABG x 3 at time of AVR (LiMA-LAD, SVG-2nd daigonal, SVG-PDA) 02/07/12 (post-op course noted above). . Cancer [facility]) '90's  Colon . Cataract   right eye, hx of . Cellulitis   a. RLE cellulitis 2 months post-operatively after AVR/CABG - serratia marcessans, tx with I&D/antibiotics . CHF (congestive heart failure) ([place])  . Chronic kidney disease  . Chronic systolic heart failure ([place])   a. TEE 01/2012: EF 25-30%, diffuse hypokinesis. b. Not on ACEI due to renal insufficiency.;  c. follow up  echo 08/06/12: EF 25%,  mod diast dysfxn, AVR ok, mild MR, mod LAE, mild RAE, mild to mod RV systolic dysfunction . Diabetes mellitus   borderline . Flash pulmonary edema ([place])   Post-cath 12/2011, went into acute pulm edema requiring IV lasix and intubation . GERD (gastroesophageal reflux disease)  . History of colon cancer   s/p colon resection . HLD (hyperlipidemia)  . HTN (  hypertension)   primary, Dr. Hollace Kinnier . Ischemic cardiomyopathy  . Mitral regurgitation   Moderate by TEE 01/2012 . Myocardial infarction ([place]) 12/2011 . Pleural effusion   a. post-operatively after AVR/CABG s/p thoracentesis 03/2012 yielding 1L serosanguinous fluid. . Stroke ([place]) 10/01  RIght Leg weakness Past Surgical History: Past Surgical History: Procedure Laterality Date . AORTIC VALVE REPLACEMENT  02/07/2012  Procedure: AORTIC VALVE REPLACEMENT (AVR);  Surgeon: Gaye Pollack, MD;  Location: [place];  Service: Open Heart Surgery;  Laterality: N/A; . CARDIAC CATHETERIZATION    1.3.13  stopped breathing, put on ventilator for 5-6 days . CATARACT EXTRACTION    rt . CHEST TUBE INSERTION  07/24/2012  Procedure: INSERTION PLEURAL DRAINAGE CATHETER;  Surgeon: Gaye Pollack, MD;  Location: [place];  Service: Thoracic;  Laterality: Left; . COLON RESECTION  1996 . COLONOSCOPY   . CORONARY ARTERY BYPASS GRAFT  02/07/2012  Procedure: CORONARY ARTERY BYPASS GRAFTING (CABG);  Surgeon: Gaye Pollack, MD;  Location: [place];  Service: Open Heart Surgery;  Laterality: N/A;  CABG x three; using right leg greater saphenous vein harvested endoscopically . ERCP N/A 07/08/2014  Procedure: ENDOSCOPIC RETROGRADE CHOLANGIOPANCREATOGRAPHY (ERCP);  Surgeon: Missy Sabins, MD;  Location: [facility] ENDOSCOPY;  Service: Endoscopy;  Laterality: N/A; . ESOPHAGEAL DILATION   . ESOPHAGOGASTRODUODENOSCOPY N/A 07/03/2014  Procedure: ESOPHAGOGASTRODUODENOSCOPY (EGD);  Surgeon: Arta Silence, MD;  Location: [facility] ENDOSCOPY;  Service: Endoscopy;  Laterality: N/A; . ESOPHAGOSCOPY WITH DILITATION N/A 07/07/2014   Procedure: ESOPHAGOSCOPY WITH DILITATION/SAVARY DILATOR;  Surgeon: Rozetta Nunnery, MD;  Location: [facility] OR;  Service: ENT;  Laterality: N/A; . EYE SURGERY  2009  cataract removed from right eye . INGUINAL HERNIA REPAIR Right 09/13/2015  Procedure: OPEN RIGHT INGUINAL HERNIA;  Surgeon: Mickeal Skinner, MD;  Location: [place];  Service: General;  Laterality: Right; . INSERTION OF MESH Right 09/13/2015  Procedure: INSERTION OF MESH;  Surgeon: Mickeal Skinner, MD;  Location: [place];  Service: General;  Laterality: Right; . LEFT HEART CATHETERIZATION WITH CORONARY ANGIOGRAM N/A 12/13/2011  Procedure: LEFT HEART CATHETERIZATION WITH CORONARY ANGIOGRAM;  Surgeon: Peter M Martinique, MD;  Location: [facility] CATH LAB;  Service: Cardiovascular;  Laterality: N/A; . REMOVAL OF PLEURAL DRAINAGE CATHETER  09/27/2012  Procedure: REMOVAL OF PLEURAL DRAINAGE CATHETER;  Surgeon: Gaye Pollack, MD;  Location: [place];  Service: Thoracic;  Laterality: Left; . TALC PLEURODESIS  08/30/2012  Procedure: Pietro Cassis;  Surgeon: Gaye Pollack, MD;  Location: [place];  Service: Thoracic;  Laterality: Left;  INSERTION OF TALC VIA LEFT PLEURX . TALC PLEURODESIS  09/12/2012  Procedure: Pietro Cassis;  Surgeon: Gaye Pollack, MD;  Location: [place];  Service: Thoracic;  Laterality: Left; . TONSILLECTOMY   HPI: Layden Caterino a 85 y.o.malewith medical history significant ofcoronary artery disease, dysphagia, esophageal stricture with dilation, CABG, bioprosthetic AVR, anemia, chronic kidney disease stage IV, chronic systolic heart failure and colon cancer presented today with complaints of sudden onset of chest pain. July 24th 2015 ERCP revealed "very tight cervical esophageal web and unable to traverse endoscope, therefore ERCP procedure aborted." EGD done under general anethesia for esophageal dilation. MBS 2016 incomplete epiglottic closure and backflow from cervical esophagus with aspirated thin and penetrated nectar; D2/thin recommended.  BSE 10/24/15 regular/thin recommended. Chest Xray 1/2 concerning for pna however chest CT 12/12/17 no acute abnormality identified in the chest, abdomen, or pelvis.  Subjective: pt alert, cooperative Assessment / Plan / Recommendation CHL IP CLINICAL IMPRESSIONS 12/17/2017 Clinical Impression Pt has a  mild oropharyngeal dysphagia with more prominent cervical esophageal issues. He has intermittent penetration and silent aspiration into the airway before the swallow with larger boluses of thin liquids secondary to premature spillage. This is reduced with smaller cup sips, but pt also has retrograde flow from the cervical esophagus back into the pharynx and larynx, requiring cues for more effortful cough to clear this penetration. He has improved oral containment when consuming nectar thick liquids via straw, but all consistencies tested have slow clearance through the cervical esophagus. Heavier boluses still had backflow within the UES, but did not reach all the way back into the larynx. Although pt does say that he can sense when boluses "come back up", he does not show any signs to suggest that he is aware of any airway compromise. SLP provided Min cues for use of chin tuck, but this did not improve airway protection or clearance of mild vallecular residue. Recommend initiation of Dys 2 diet and nectar thick liquids by straw, using intermittent throat clearing and esophageal/aspiration precautions. Recommend additional f/u with ENT to address cervical esophageal issues. SLP Visit Diagnosis Dysphagia, pharyngoesophageal phase (R13.14) Attention and concentration deficit following -- Frontal lobe and executive function deficit following -- Impact on safety and function Moderate aspiration risk   CHL IP TREATMENT RECOMMENDATION 12/17/2017 Treatment Recommendations Therapy as outlined in treatment plan below   Prognosis 12/17/2017 Prognosis for Safe Diet Advancement Fair Barriers to Reach Goals Time post onset  Barriers/Prognosis Comment -- CHL IP DIET RECOMMENDATION 12/17/2017 SLP Diet Recommendations Dysphagia 2 (Fine chop) solids;Nectar thick liquid Liquid Administration via Straw Medication Administration Crushed with puree Compensations Slow rate;Small sips/bites;Follow solids with liquid;Clear throat intermittently Postural Changes Remain semi-upright after after feeds/meals (Comment);Seated upright at 90 degrees   CHL IP OTHER RECOMMENDATIONS 12/17/2017 Recommended Consults -- Oral Care Recommendations Oral care BID Other Recommendations Order thickener from pharmacy;Prohibited food (jello, ice cream, thin soups);Remove water pitcher   CHL IP FOLLOW UP RECOMMENDATIONS 12/17/2017 Follow up Recommendations (No Data)   CHL IP FREQUENCY AND DURATION 12/17/2017 Speech Therapy Frequency (ACUTE ONLY) min 2x/week Treatment Duration 2 weeks      CHL IP ORAL PHASE 12/17/2017 Oral Phase Impaired Oral - Pudding Teaspoon -- Oral - Pudding Cup -- Oral - Honey Teaspoon -- Oral - Honey Cup -- Oral - Nectar Teaspoon -- Oral - Nectar Cup Premature spillage Oral - Nectar Straw -- Oral - Thin Teaspoon -- Oral - Thin Cup Premature spillage Oral - Thin Straw Premature spillage Oral - Puree WFL Oral - Mech Soft WFL Oral - Regular -- Oral - Multi-Consistency -- Oral - Pill -- Oral Phase - Comment --  CHL IP PHARYNGEAL PHASE 12/17/2017 Pharyngeal Phase Impaired Pharyngeal- Pudding Teaspoon -- Pharyngeal -- Pharyngeal- Pudding Cup -- Pharyngeal -- Pharyngeal- Honey Teaspoon -- Pharyngeal -- Pharyngeal- Honey Cup -- Pharyngeal -- Pharyngeal- Nectar Teaspoon -- Pharyngeal -- Pharyngeal- Nectar Cup Reduced epiglottic inversion;Reduced anterior laryngeal mobility;Reduced laryngeal elevation;Penetration/Aspiration during swallow Pharyngeal Material enters airway, remains ABOVE vocal cords and not ejected out Pharyngeal- Nectar Straw Reduced epiglottic inversion;Reduced anterior laryngeal mobility;Reduced laryngeal elevation;Penetration/Aspiration during  swallow Pharyngeal Material enters airway, remains ABOVE vocal cords and not ejected out Pharyngeal- Thin Teaspoon -- Pharyngeal -- Pharyngeal- Thin Cup Reduced epiglottic inversion;Reduced anterior laryngeal mobility;Reduced laryngeal elevation;Penetration/Aspiration before swallow;Penetration/Apiration after swallow Pharyngeal Material enters airway, passes BELOW cords without attempt by patient to eject out (silent aspiration) Pharyngeal- Thin Straw Reduced epiglottic inversion;Reduced anterior laryngeal mobility;Reduced laryngeal elevation;Penetration/Aspiration before swallow Pharyngeal Material enters airway, CONTACTS cords and not  ejected out Pharyngeal- Puree Reduced epiglottic inversion;Reduced anterior laryngeal mobility;Reduced laryngeal elevation Pharyngeal -- Pharyngeal- Mechanical Soft Reduced epiglottic inversion;Reduced anterior laryngeal mobility;Reduced laryngeal elevation Pharyngeal -- Pharyngeal- Regular -- Pharyngeal -- Pharyngeal- Multi-consistency -- Pharyngeal -- Pharyngeal- Pill -- Pharyngeal -- Pharyngeal Comment --  CHL IP CERVICAL ESOPHAGEAL PHASE 12/17/2017 Cervical Esophageal Phase Impaired Pudding Teaspoon -- Pudding Cup -- Honey Teaspoon -- Honey Cup -- Nectar Teaspoon -- Nectar Cup Reduced cricopharyngeal relaxation;Esophageal backflow into cervical esophagus Nectar Straw Reduced cricopharyngeal relaxation;Esophageal backflow into cervical esophagus Thin Teaspoon -- Thin Cup Reduced cricopharyngeal relaxation;Esophageal backflow into the pharynx;Esophageal backflow into the larynx Thin Straw Reduced cricopharyngeal relaxation;Esophageal backflow into the pharynx Puree Reduced cricopharyngeal relaxation;Esophageal backflow into cervical esophagus Mechanical Soft Reduced cricopharyngeal relaxation;Esophageal backflow into cervical esophagus Regular -- Multi-consistency -- Pill -- Cervical Esophageal Comment -- CHL IP GO 09/21/2015 Functional Assessment Tool Used clinical judgement  Functional Limitations Swallowing Swallow Current Status (G6269) CJ Swallow Goal Status (S8546) CI Swallow Discharge Status (E7035) (None) Motor Speech Current Status (K0938) (None) Motor Speech Goal Status (H8299) (None) Motor Speech Goal Status (B7169) (None) Spoken Language Comprehension Current Status (C7893) (None) Spoken Language Comprehension Goal Status (Y1017) (None) Spoken Language Comprehension Discharge Status (P1025) (None) Spoken Language Expression Current Status (E5277) (None) Spoken Language Expression Goal Status (O2423) (None) Spoken Language Expression Discharge Status [phone]) (None) Attention Current Status (E3154) (None) Attention Goal Status (M0867) (None) Attention Discharge Status (Y1950) (None) Memory Current Status (D3267) (None) Memory Goal Status (T2458) (None) Memory Discharge Status (K9983) (None) Voice Current Status (J8250) (None) Voice Goal Status (N3976) (None) Voice Discharge Status (B3419) (None) Other Speech-Language Pathology Functional Limitation Current Status (F7902) (None) Other Speech-Language Pathology Functional Limitation Goal Status (I0973) (None) Other Speech-Language Pathology Functional Limitation Discharge Status [phone]) (None) Germain Osgood 12/17/2017, 12:21 PM  Germain Osgood, M.A. CCC-SLP [phone]              Scheduled Meds: . aspirin  150 mg Rectal Daily  . [START ON 12/19/2017] azithromycin  250 mg Oral Daily  . carvedilol  12.5 mg Oral BID WC  . cefUROXime  250 mg Oral Q supper  . furosemide  40 mg Oral QPM  . [START ON 12/19/2017] furosemide  80 mg Oral Q breakfast  . heparin  5,000 Units Subcutaneous Q8H  . hydrALAZINE  50 mg Oral Q8H  . insulin aspart  0-5 Units Subcutaneous QHS  . insulin aspart  0-9 Units Subcutaneous TID WC  . isosorbide mononitrate  120 mg Oral Daily  . pantoprazole (PROTONIX) IV  40 mg Intravenous Q24H   Continuous Infusions:    LOS: 5 days   Marylu Lund, MD [facility] Pager [phone]  If  7PM-7AM, please contact night-coverage [email or web address] Password [facility] 12/18/2017, 3:25 PM

## 2017-12-19 LAB — BASIC METABOLIC PANEL
Anion gap: 9 (ref 5–15)
BUN: 36 mg/dL — ABNORMAL HIGH (ref 6–20)
CALCIUM: 8.8 mg/dL — AB (ref 8.9–10.3)
CHLORIDE: 108 mmol/L (ref 101–111)
CO2: 25 mmol/L (ref 22–32)
CREATININE: 2.62 mg/dL — AB (ref 0.61–1.24)
GFR calc Af Amer: 23 mL/min — ABNORMAL LOW (ref >60)
GFR calc non Af Amer: 20 mL/min — ABNORMAL LOW (ref >60)
GLUCOSE: 133 mg/dL — AB (ref 65–99)
Potassium: 4 mmol/L (ref 3.5–5.1)
Sodium: 142 mmol/L (ref 135–145)

## 2017-12-19 LAB — CBC
HEMATOCRIT: 30.2 % — AB (ref 39.0–52.0)
HEMOGLOBIN: 9.4 g/dL — AB (ref 13.0–17.0)
MCH: 31.4 pg (ref 26.0–34.0)
MCHC: 31.1 g/dL (ref 30.0–36.0)
MCV: 101 fL — AB (ref 78.0–100.0)
Platelets: 124 10*3/uL — ABNORMAL LOW (ref 150–400)
RBC: 2.99 MIL/uL — ABNORMAL LOW (ref 4.22–5.81)
RDW: 14 % (ref 11.5–15.5)
WBC: 7.1 10*3/uL (ref 4.0–10.5)

## 2017-12-19 LAB — GLUCOSE, CAPILLARY
Glucose-Capillary: 144 mg/dL — ABNORMAL HIGH (ref 65–99)
Glucose-Capillary: 175 mg/dL — ABNORMAL HIGH (ref 65–99)

## 2017-12-19 MED ORDER — HYDROCODONE-ACETAMINOPHEN 5-325 MG PO TABS: 1.0000 | ORAL_TABLET | Freq: Four times a day (QID) | ORAL | 0 refills | Status: DC | PRN

## 2017-12-19 MED ORDER — FUROSEMIDE 40 MG PO TABS: 80.0000 mg | ORAL_TABLET | Freq: Every day | ORAL | 6 refills | Status: DC

## 2017-12-19 NOTE — Progress Notes (Signed)
Occupational Therapy Treatment Patient Details Name: [name] [name] MRN: [id number] DOB: [birth year] Today's Date: 12/19/2017    History of present illness Pt is an 84 y.o. male admitted 12/12/16 with c/o sudden chest pain and intermittent cough; determined to be likely pleuritic, and worked up for potential L-sided community-acquired bacterial pneumonia; chest CT 12/12/17 no acute abnormality identified in the chest, abdomen, or pelvis. Speech pathology eval 1/3 indicated significant oral pharyngeal dysphagia. Pertinent PMH includes CAD s/p CABG, biprosthetic AVR, anemia, CKD IV, CHF, colon cancer.   OT comments  Pt completed sponge bathing, dressing and grooming seated on 3 in 1 at sink. Good endurance. Continues to demonstrate slow processing, but pt also HOH. Eager to go home.  Follow Up Recommendations  Home health OT;Supervision/Assistance - 24 hour    Equipment Recommendations  3 in 1 bedside commode    Recommendations for Other Services      Precautions / Restrictions Precautions Precautions: Fall Restrictions Weight Bearing Restrictions: No       Mobility Bed Mobility               General bed mobility comments: pt in chair  Transfers Overall transfer level: Needs assistance Equipment used: Rolling walker (2 wheeled) Transfers: Sit to/from Stand Sit to Stand: Min guard         General transfer comment: increased time, good technique    Balance     Sitting balance-Leahy Scale: Good       Standing balance-Leahy Scale: Poor Standing balance comment: needs at least one hand on sink to steady when washing periarea                           ADL either performed or assessed with clinical judgement   ADL Overall ADL's : Needs assistance/impaired     Grooming: Oral care;Wash/dry hands;Wash/dry face;Sitting;Set up;Brushing hair   Upper Body Bathing: Minimal assistance;Sitting Upper Body Bathing Details (indicate cue type and reason): assisted  for back, recommended long handled bath sponge     Upper Body Dressing : Set up;Sitting       Toilet Transfer: Min guard;Ambulation;RW;BSC   Toileting- Water quality scientist and Hygiene: Min guard;Sit to/from stand       Functional mobility during ADLs: Min guard;Rolling walker General ADL Comments: pt sat at sink to perform self care     Vision       Perception     Praxis      Cognition Arousal/Alertness: Awake/alert Behavior During Therapy: WFL for tasks assessed/performed Overall Cognitive Status: Impaired/Different from baseline Area of Impairment: Problem solving;Following commands                       Following Commands: Follows one step commands with increased time;Follows one step commands consistently     Problem Solving: Requires verbal cues;Slow processing          Exercises     Shoulder Instructions       General Comments      Pertinent Vitals/ Pain       Pain Assessment: No/denies pain  Home Living                                          Prior Functioning/Environment              Frequency  Min 2X/week  Progress Toward Goals  OT Goals(current goals can now be found in the care plan section)  Progress towards OT goals: Progressing toward goals  Acute Rehab OT Goals Patient Stated Goal: Return home OT Goal Formulation: With patient/family Time For Goal Achievement: 12/30/17 Potential to Achieve Goals: Good  Plan Discharge plan remains appropriate    Co-evaluation                 AM-PAC PT "6 Clicks" Daily Activity     Outcome Measure   Help from another person eating meals?: None Help from another person taking care of personal grooming?: A Little Help from another person toileting, which includes using toliet, bedpan, or urinal?: A Little Help from another person bathing (including washing, rinsing, drying)?: A Little Help from another person to put on and taking off regular  upper body clothing?: None Help from another person to put on and taking off regular lower body clothing?: A Little 6 Click Score: 20    End of Session Equipment Utilized During Treatment: Gait belt;Rolling walker  OT Visit Diagnosis: Unsteadiness on feet (R26.81);Muscle weakness (generalized) (M62.81)   Activity Tolerance Patient tolerated treatment well   Patient Left in chair;with call bell/phone within reach;with family/visitor present   Nurse Communication          Time: 0017-4944 OT Time Calculation (min): 32 min  Charges: OT General Charges $OT Visit: 1 Visit OT Treatments $Self Care/Home Management : 23-37 mins  12/19/2017 Nestor Lewandowsky, OTR/L Pager: [phone]   Werner Lean Haze Boyden 12/19/2017, 9:52 AM

## 2017-12-19 NOTE — Consult Note (Signed)
   [facility] CM Inpatient Consult   12/19/2017  Damarius Karnes [birth year] [id number]  Follow up:  Met with patient and wife, daughter as well regarding consent for the [facility] program.  Patient and wife agrees to the written consent and made aware that [facility] will be referred from [facility] if ongoing needs for community follow up is needed.  Patient's wife signed consent.  Updated inpatient RNCM  No further needs voiced.  Natividad Brood, RN BSN [facility] Liaison  [phone] business mobile phone Toll free office [phone]

## 2017-12-19 NOTE — Progress Notes (Signed)
Physical Therapy Treatment Patient Details Name: [name] [name] MRN: [id number] DOB: [birth year] Today's Date: 12/19/2017    History of Present Illness Pt is an 84 y.o. male admitted 12/12/16 with c/o sudden chest pain and intermittent cough; determined to be likely pleuritic, and worked up for potential L-sided community-acquired bacterial pneumonia; chest CT 12/12/17 no acute abnormality identified in the chest, abdomen, or pelvis. Speech pathology eval 1/3 indicated significant oral pharyngeal dysphagia. Pertinent PMH includes CAD s/p CABG, biprosthetic AVR, anemia, CKD IV, CHF, colon cancer.    PT Comments    Pt performed gait and transfer training followed by series of seated and standing exercises.  Pt presents with increased WOB but denies SHOB.  Vitals stable.  Pt remains appropriate to return home with support from spouse.  Plan next session for continued strengthening for carryover with function.      Follow Up Recommendations  Home health PT;Supervision for mobility/OOB     Equipment Recommendations  None recommended by PT(son owns RW)    Recommendations for Other Services       Precautions / Restrictions Precautions Precautions: Fall Restrictions Weight Bearing Restrictions: No    Mobility  Bed Mobility               General bed mobility comments: pt in chair  Transfers Overall transfer level: Needs assistance Equipment used: Rolling walker (2 wheeled) Transfers: Sit to/from Stand Sit to Stand: Supervision         General transfer comment: Increased time and cues for hand placement to and from seated surface and backing entirely to seated surface.    Ambulation/Gait Ambulation/Gait assistance: Min guard Ambulation Distance (Feet): 300 Feet Assistive device: Rolling walker (2 wheeled) Gait Pattern/deviations: Step-through pattern;Decreased stride length;Trunk flexed Gait velocity: Decreased Gait velocity interpretation: Below normal speed for  age/gender General Gait Details: Grossly ambulating at a min guard assist level. VC's for improved posture and forward gaze. Pt reports no DOE and minimal fatigue at end of gait training.  SP O2 96% on RA.  HR 66 bpm.     Stairs            Wheelchair Mobility    Modified Rankin (Stroke Patients Only)       Balance Overall balance assessment: Needs assistance   Sitting balance-Leahy Scale: Good       Standing balance-Leahy Scale: Poor Standing balance comment: needs at least one hand on sink to steady when washing periarea                            Cognition Arousal/Alertness: Awake/alert Behavior During Therapy: WFL for tasks assessed/performed Overall Cognitive Status: Impaired/Different from baseline Area of Impairment: Problem solving;Following commands                       Following Commands: Follows one step commands with increased time;Follows one step commands consistently     Problem Solving: Requires verbal cues;Slow processing        Exercises Total Joint Exercises Towel Squeeze: AROM;Both;10 reps;Seated General Exercises - Lower Extremity Long Arc Quad: AROM;Both;10 reps;Seated Hip ABduction/ADduction: AROM;Both;10 reps;Seated(with manual resistance applied during hip abduction, isometric hold of 3 sec at end range.  ) Hip Flexion/Marching: AROM;Both;10 reps;Seated Heel Raises: AROM;Both;10 reps;Standing Mini-Sqauts: AROM;10 reps;Standing;Both    General Comments        Pertinent Vitals/Pain Pain Assessment: No/denies pain Pain Location: not specified    Home Living  Prior Function            PT Goals (current goals can now be found in the care plan section) Acute Rehab PT Goals Patient Stated Goal: Return home Potential to Achieve Goals: Good Progress towards PT goals: Progressing toward goals    Frequency           PT Plan Current plan remains appropriate     Co-evaluation              AM-PAC PT "6 Clicks" Daily Activity  Outcome Measure  Difficulty turning over in bed (including adjusting bedclothes, sheets and blankets)?: A Little Difficulty moving from lying on back to sitting on the side of the bed? : A Little Difficulty sitting down on and standing up from a chair with arms (e.g., wheelchair, bedside commode, etc,.)?: A Little Help needed moving to and from a bed to chair (including a wheelchair)?: A Little Help needed walking in hospital room?: A Little Help needed climbing 3-5 steps with a railing? : A Little 6 Click Score: 18    End of Session Equipment Utilized During Treatment: Gait belt Activity Tolerance: Patient tolerated treatment well Patient left: in chair;with call bell/phone within reach;with family/visitor present Nurse Communication: Mobility status PT Visit Diagnosis: Other abnormalities of gait and mobility (R26.89);Muscle weakness (generalized) (M62.81)     Time: 4076-8088 PT Time Calculation (min) (ACUTE ONLY): 15 min  Charges:  $Gait Training: 8-22 mins                    G Codes:       Governor Rooks, PTA pager [phone]    Cristela Blue 12/19/2017, 11:57 AM

## 2017-12-19 NOTE — Progress Notes (Signed)
Advanced Heart Failure Rounding Note  PCP:  Primary Cardiologist: Dr. Aundra Dubin   Subjective:    Started back on po diuretics 12/17/17. Creatinine up 2.3 -> 2.6 today.   Feeling good. Appetite waxing and waning. Anxious to go home. Denies SOB or CP.    Speech saw 12/13/16 for pharyngeal dysphagia marked by "explosive, prolonged coughing". GI consulted as well who recommended ENT. ENT plan for barium swallow today.    VQ scan 12/12/17 negative/"very low probability for PE" CT Chest/Abd/Pelvis 12/12/17 negative for any acute abnormality.  Objective:   Weight Range: 161 lb 6 oz (73.2 kg) Body mass index is 24.54 kg/m.   Vital Signs:   Temp:  [98.1 F (36.7 C)-99.2 F (37.3 C)] 99.2 F (37.3 C) (01/09 0607) Pulse Rate:  [66-73] 73 (01/09 0607) Resp:  [14-18] 14 (01/09 0607) BP: (123-134)/(41-49) 130/43 (01/09 0607) SpO2:  [95 %-98 %] 95 % (01/09 0607) Weight:  [161 lb 6 oz (73.2 kg)] 161 lb 6 oz (73.2 kg) (01/09 0607) Last BM Date: 12/18/17  Weight change: Filed Weights   12/17/17 0500 12/18/17 0700 12/19/17 0607  Weight: 151 lb 8 oz (68.7 kg) 149 lb 12.8 oz (67.9 kg) 161 lb 6 oz (73.2 kg)   Intake/Output:   Intake/Output Summary (Last 24 hours) at 12/19/2017 0825 Last data filed at 12/19/2017 0738 Gross per 24 hour  Intake 480 ml  Output 1000 ml  Net -520 ml    Physical Exam    General: NAD. Seated in chair.  HEENT: Normal Neck: Supple. JVP 5-6. Carotids 2+ bilat; no bruits. No thyromegaly or nodule noted. Cor: PMI nondisplaced. RRR, 2/6 SEM RUBS Lungs: CTAB, normal effort. Abdomen: Soft, non-tender, non-distended, no HSM. No bruits or masses. +BS  Extremities: No cyanosis, clubbing, or rash. R and LLE no edema.  Neuro: Alert & orientedx3, cranial nerves grossly intact. moves all 4 extremities w/o difficulty. Affect pleasant   Telemetry   NSR 60-70s, personally reviewed.   EKG    No new tracings.   Labs    CBC Recent Labs    12/18/17 0649 12/19/17 0433    WBC 6.2 7.1  HGB 10.0* 9.4*  HCT 31.6* 30.2*  MCV 102.6* 101.0*  PLT 126* 947*   Basic Metabolic Panel Recent Labs    12/18/17 0649 12/19/17 0433  NA 147* 142  K 4.0 4.0  CL 114* 108  CO2 23 25  GLUCOSE 147* 133*  BUN 32* 36*  CREATININE 2.34* 2.62*  CALCIUM 8.8* 8.8*   Liver Function Tests Recent Labs    12/17/17 0626  AST 17  ALT 13*  ALKPHOS 39  BILITOT 0.7  PROT 5.7*  ALBUMIN 2.6*   No results for input(s): LIPASE, AMYLASE in the last 72 hours. Cardiac Enzymes No results for input(s): CKTOTAL, CKMB, CKMBINDEX, TROPONINI in the last 72 hours.  BNP: BNP (last 3 results) Recent Labs    07/05/17 1517 07/11/17 1734 12/12/17 1335  BNP 529.6* 281.4* 384.0*    ProBNP (last 3 results) No results for input(s): PROBNP in the last 8760 hours.   D-Dimer No results for input(s): DDIMER in the last 72 hours. Hemoglobin A1C No results for input(s): HGBA1C in the last 72 hours. Fasting Lipid Panel No results for input(s): CHOL, HDL, LDLCALC, TRIG, CHOLHDL, LDLDIRECT in the last 72 hours. Thyroid Function Tests No results for input(s): TSH, T4TOTAL, T3FREE, THYROIDAB in the last 72 hours.  Invalid input(s): FREET3  Other results:   Imaging  No results found.  Medications:     Scheduled Medications: . aspirin  81 mg Oral Daily  . azithromycin  250 mg Oral Daily  . carvedilol  12.5 mg Oral BID WC  . cefUROXime  250 mg Oral Q supper  . furosemide  40 mg Oral QPM  . furosemide  80 mg Oral Q breakfast  . heparin  5,000 Units Subcutaneous Q8H  . hydrALAZINE  50 mg Oral Q8H  . insulin aspart  0-5 Units Subcutaneous QHS  . insulin aspart  0-9 Units Subcutaneous TID WC  . isosorbide mononitrate  120 mg Oral Daily  . pantoprazole  40 mg Oral q1800    Infusions:   PRN Medications: acetaminophen, gi cocktail, hydrALAZINE, nitroGLYCERIN, ondansetron (ZOFRAN) IV, oxyCODONE-acetaminophen, RESOURCE THICKENUP CLEAR  Patient Profile   Mr Abbey is  an35 y.o. male with a PMHx of CAD, CABG, bioprosthetic AVR, anemia, CKD stage 4, chronic systolic heart failure. He is not on an ACE or ARB due renal failure.   Brought in via EMS with recurrent CP, not relieved by NTG.   Assessment/Plan   1. Chest pain => Pleuritic  => ? Infectious process +/- Dysphagia - No further. Work up unremarkable for ACS. Leading concern oropharyngeal dysphagia as below.  - Atypical. Affecting his left chest and ? Upper abdomen.  Not worse with palpation but worse with respirations.  - D-dimer 0.59. VQ scan negative.  - CXR showed LLL PNA but CT chest/abdomen/pelvis did not show PNA or other acute abnormality.   - Think we can stop antbiotics after today's doses.  - Now questioning component of dysphagia, ?possible aspiration though infiltrate not seen on CT. GI following and ENT to see with history of same. No change.  2. CAD: S/p CABG - Has multiple ED visits and admissions with CP. Troponin negative and usually improves with lasix.  - As previously discussed, with absence of definite MI, would avoid cardiac cath with CKD stage 4 and continue to treat symptomatically (therefore, will not do stress test). - Continue ASA 81.  - No statin with myalgias. - Back on po meds.  -3. ARF on CKD stage IV:  - Creatinine near baseline currently. Continue to follow.  4. Aortic valve replacement: Bioprosthetic. - Valve looked stable on 7/18 echo. No change. 5. Chronic systolic CHF: Ischemic cardiomyopathy.  - EF 25-30% on 7/18 echo, down from EF 40-45%. - Continue lasix 80 mg daily (decreased from 80 bid at home).   - Continue hydralazine 50 mg TID - Continue imdur 120 mg daily - Continue coreg 12.5 mg BID.  - Not on ACEI because of CKD.  - BP improved. 6. Delirium - No further.  7. Deconditioning - PT seeing and recommending rehab. Wife states he would not want this at all.  - He is DNR. May need to consider palliative care/hospice if not stable at home. No  change.  8. Oropharyngeal dysphagia: Has had upper esophageal dilatation in the past by ENT. Speech saw 12/13/16 for pharyngeal dysphagia marked by "explosive, prolonged coughing". ENT seen this admit as well.  - Barium swallow 12/17/17 with mild oropharyngeal dysphagia with more prominent cervical esophageal issues. Moderate aspiration risk. - Speech therapy recommended.   Pt has HF follow up scheduled for 12/26/17.  HF meds for home - Lasix 80 mg DAILY (Down from BID with dysphagia) Should take an extra 40 mg as needed in the evening - Hydralazine 50 mg TID - Imdur 120 mg daily - Coreg 12.5 mg  BID - ASA 81 daily - Zetia 10 daily  Medication concerns reviewed with patient and pharmacy team. Barriers identified: None at this time.  Length of Stay: [address]  Shirley Friar, [place]  12/19/2017, 8:25 AM  Advanced Heart Failure Team Pager [phone] (M-F; 7a - 4p)  Please contact [facility] for night-coverage after hours (4p -7a ) and weekends on [email or web address]  Patient seen with PA, agree with the above note.    Seen by Dr. Cristina Gong yesterday who does not think that the patient's spells are GI-related (aspiration).  I cannot explain them definitively by ischemia or CHF.  Agree that symptoms could possibly be functional.    From cardiac standpoint, he is ready for discharge on the above medication regimen.  Not decrease in Lasix, can take evening Lasix 40 mg as needed.   Loralie Champagne 12/19/2017 9:35 AM

## 2017-12-19 NOTE — Discharge Summary (Addendum)
Discharge Summary  [name] [name] VEL:[id number] DOB: 02/18/1926  PCP: [name] Curry, DO  Admit date: 12/12/2017 Discharge date: 12/19/2017  Time spent: >30 mins  Recommendations for Outpatient Follow-up:  1. PCP 2. Heart Failure clinic with cardiology 3. GI with [name] [name]  Discharge Diagnoses:  Active Hospital Problems   Diagnosis Date Noted  . Pneumonia 12/12/2017  . Community acquired pneumonia 10/23/2015  . Chest pain 09/20/2015  . Diabetes mellitus type 2, controlled, with complications ([place]) 25/85/2778  . Chronic combined systolic and diastolic heart failure, NYHA class 2 ([place]) 01/11/2012  . Chronic kidney disease (CKD), stage IV (severe) ([place]) 01/11/2012    Resolved Hospital Problems  No resolved problems to display.    Discharge Condition: Stable  Diet recommendation: Heart healthy  Vitals:   12/19/17 1000 12/19/17 1226  BP: (!) 140/46 (!) 122/44  Pulse:  66  Resp:  16  Temp:  (!) 97.4 F (36.3 C)  SpO2:  98%    History of present illness:  84 y.o.malewith medical history significant ofcoronary artery disease, CABG, bioprosthetic AVR, anemia, chronic kidney disease stage IV, chronic systolic heart failure and colon cancer presented today with complaints of sudden onset of chest pain. Patient states that he started having chest pain early this morning after he woke up, mostly left-sided, sharp in nature, intermittent, up to 7 out of 10 intensity with radiation to the whole chest and his abdomen. He also complained of some shortness of breath. Chest pain was noted with nitroglycerin. Patient also complains of intermittent cough but no productive sputum, fever, nausea, vomiting, diarrhea, chest trauma. Patient denied any recent weight gain or decreased urine output. Pt admitted for further evaluation  Today, pt reported no new complaints, denies any chest pain, abdominal pain. Stable to be discharged home.  Hospital Course:  Active Problems:   Chronic  combined systolic and diastolic heart failure, NYHA class 2 (HCC)   Chronic kidney disease (CKD), stage IV (severe) (HCC)   Diabetes mellitus type 2, controlled, with complications (HCC)   Chest pain   Community acquired pneumonia   Pneumonia  Non-cardiac Chest pain Resolved -Unrelieved by nitroglycerin -Suspect esophogeal in etiology. CT chest without evidence on PNA -VQ without evidence of PE -2d echo with EF of 40-45% -Chart reviewed. Patient with hx of proximal esophageal strictures s/p prior dilation in 2015 -Consulted GI given hx of proximal esophageal stricture. Discussed with GI. No indication for dilation at this time, likely somatic in nature. Follow-up appointment with his primary gastroenterologist, [name]. Wonda [name], for January 22 at 2:15 PM Pt d/c on norco 5 mgQ6H prn for ??somatic chest pain until GI and PCP appt   Aspiration Pneumonia -Completed 7 day course of Rocephin, Zithromax, ceftin -Patient with witnessed aspiration of contrast into airway per Radiology during barium swallow which was stopped prematurely secondary to aspiration -SLP following - now cleared for dysphagia 2 diet  Coronary artery disease status post CABG -History of recurrent chest pain with multiple ED visits and admissions -Initial troponin negative, flat mild elevation. Cardiology follow up -Continue aspirin, Coreg, Imdur, hydralazine as tolerated -remains stable currently  Chronic systolic CHF with ischemic cardiomyopathy with history of bioprosthetic AVR -EF of 25-30% on 7/18 -Continue lasix 80 mg daily, may take extra 40 mg prn, coreg, imdur, hydralazine as mentioned above - To follow up in HF clinic  CKD stage IV -Stable -PCP to follow up with repeat BMP  Diabetes mellitus type 2 Hemoglobin A1c noted to be 6.1 Continue PO glimepiride  HTN -Cont regimen as above  Constipation -Abd xray reviewed. Findings suggestive of constipation -Good results noted with soap suds  enema   Procedures:  None  Consultations:  GI  ENT  Cardiology  Discharge Exam: BP (!) 122/44 (BP Location: Left Arm)   Pulse 66   Temp (!) 97.4 F (36.3 C) (Oral)   Resp 16   Ht 5' 8"  (1.727 m)   Wt 73.2 kg (161 lb 6 oz) Comment: wife asked that he be weighed in bed  SpO2 98%   BMI 24.54 kg/m   General: NAD, met pt sitting up in chair Cardiovascular: RRR, 2/6 SEM   Respiratory: Clear bilaterally  Discharge Instructions You were cared for by a hospitalist during your hospital stay. If you have any questions about your discharge medications or the care you received while you were in the hospital after you are discharged, you can call the unit and asked to speak with the hospitalist on call if the hospitalist that took care of you is not available. Once you are discharged, your primary care physician will handle any further medical issues. Please note that NO REFILLS for any discharge medications will be authorized once you are discharged, as it is imperative that you return to your primary care physician (or establish a relationship with a primary care physician if you do not have one) for your aftercare needs so that they can reassess your need for medications and monitor your lab values.  Discharge Instructions    Diet - low sodium heart healthy   Complete by:  As directed    Increase activity slowly   Complete by:  As directed      Allergies as of 12/19/2017      Reactions   Statins Other (See Comments)   Pain/weakness in legs   Ambien [zolpidem Tartrate] Anxiety, Other (See Comments)   Anxiety and hallucinations       Medication List    STOP taking these medications   potassium chloride SA 20 MEQ tablet Commonly known as:  K-DUR,KLOR-CON     TAKE these medications   acetaminophen 325 MG tablet Commonly known as:  TYLENOL Take 2 tablets (650 mg total) by mouth every 6 (six) hours as needed for mild pain or headache.   aspirin 81 MG EC tablet Take 1 tablet  (81 mg total) by mouth daily.   carvedilol 12.5 MG tablet Commonly known as:  COREG Take 1 tablet (12.5 mg total) by mouth 2 (two) times daily with a meal.   ezetimibe 10 MG tablet Commonly known as:  ZETIA Take 1 tablet (10 mg total) by mouth at bedtime.   furosemide 40 MG tablet Commonly known as:  LASIX Take 2 tablets (80 mg total) by mouth daily. May take additional 40 mg (1 tab) as needed What changed:    when to take this  additional instructions   glimepiride 1 MG tablet Commonly known as:  AMARYL TAKE 1MG BY MOUTH DAILY WITH BREAKFAST What changed:    how much to take  how to take this  when to take this  additional instructions  Another medication with the same name was removed. Continue taking this medication, and follow the directions you see here.   hydrALAZINE 50 MG tablet Commonly known as:  APRESOLINE Take 1 tablet (50 mg total) by mouth 3 (three) times daily.   HYDROcodone-acetaminophen 5-325 MG tablet Commonly known as:  NORCO/VICODIN Take 1 tablet by mouth every 6 (six) hours as needed for  moderate pain.   isosorbide mononitrate 60 MG 24 hr tablet Commonly known as:  IMDUR Take 2 tablets (120 mg total) by mouth daily. What changed:    how much to take  when to take this   multivitamin with minerals Tabs tablet Take 1 tablet by mouth daily.   nitroGLYCERIN 0.4 MG SL tablet Commonly known as:  NITROSTAT Place 1 tablet (0.4 mg total) under the tongue every 5 (five) minutes as needed for chest pain. As needed What changed:  additional instructions   omeprazole 20 MG capsule Commonly known as:  PRILOSEC Take 20 mg by mouth daily.   tamsulosin 0.4 MG Caps capsule Commonly known as:  FLOMAX Take one capsule by mouth once daily after dinner What changed:    how much to take  how to take this  when to take this  additional instructions   Travoprost (BAK Free) 0.004 % Soln ophthalmic solution Commonly known as:  TRAVATAN Place 1  drop into both eyes at bedtime.      Allergies  Allergen Reactions  . Statins Other (See Comments)    Pain/weakness in legs  . Ambien [Zolpidem Tartrate] Anxiety and Other (See Comments)    Anxiety and hallucinations    Follow-up Information    [facility]. Go on 12/26/2017.   Specialty:  Cardiology Why:  11:00 AM, [facility], parking code [phone] Contact information: [address] [id number] [place] [address] [place], [place], DO. Schedule an appointment as soon as possible for a visit in 1 week(s).   Specialty:  Geriatric Medicine Contact information: [address] [place] [address] [phone]        Care, [facility] Follow up.   Specialty:  Home Health Services Why:  Arranged RN/PT/OT/aide. will call you to set up appointment. Contact information: [address] [address] [phone]            The results of significant diagnostics from this hospitalization (including imaging, microbiology, ancillary and laboratory) are listed below for reference.    Significant Diagnostic Studies: Ct Abdomen Pelvis Wo Contrast  Result Date: 12/12/2017 CLINICAL DATA:  Left-sided chest pain and abdominal pain beginning this morning. EXAM: CT CHEST, ABDOMEN AND PELVIS WITHOUT CONTRAST TECHNIQUE: Multidetector CT imaging of the chest, abdomen and pelvis was performed following the standard protocol without IV contrast. COMPARISON:  Chest radiograph 12/12/2017. Chest, abdomen, and pelvis CT 06/25/2017. FINDINGS: CT CHEST FINDINGS Cardiovascular: Aortic and three-vessel coronary artery atherosclerosis. Prior CABG and aortic valve replacement. No aortic aneurysm. Mild cardiomegaly. No pericardial effusion. Mediastinum/Nodes: No enlarged axillary, mediastinal, or hilar lymph nodes. Unremarkable thyroid and esophagus. Lungs/Pleura: No pleural  effusion or pneumothorax. Unchanged pleural thickening and calcification in the lower left hemithorax consistent with history of talc pleurodesis. Unchanged 4 mm subpleural right upper lobe nodule (series 5, image 34). Mild scarring or atelectasis in both lung bases. Motion artifact mildly limits assessment of the lingula and basilar left lower lobe. There may be mild chronic centrilobular ground-glass nodularity in the left lower lobe, however if present this has not progressed from the prior CT. No consolidative opacities are seen. Musculoskeletal: No acute osseous abnormality or suspicious osseous lesion. Thoracic spondylosis. CT ABDOMEN PELVIS FINDINGS Hepatobiliary: Unchanged right hepatic lobe partial resection or atrophy. No focal liver abnormality identified. Prior cholecystectomy. No biliary dilatation. Pancreas: Mild fatty infiltration greatest in the head. No ductal  dilatation or acute inflammation. Spleen: Unremarkable. Adrenals/Urinary Tract: Unremarkable adrenal glands. Asymmetric, advanced left renal atrophy is unchanged, as is an exophytic 4.8 x 4.0 cm low-density lesion extending posterior to the left kidney consistent with a cyst. Subcentimeter exophytic lesion off the lower pole of the right kidney is unchanged and too small to fully characterize. No renal calculi or hydronephrosis. Moderately distended bladder are. Stomach/Bowel: The stomach is within normal limits. Prior right hemicolectomy. There is left-sided colonic diverticulosis without evidence of diverticulitis. There is no evidence of bowel obstruction. Vascular/Lymphatic: Aortoiliac atherosclerosis without evidence of aneurysm. No enlarged lymph nodes. Reproductive: Unchanged prostate enlargement. Other: Bilateral fat containing inguinal hernias. The left inguinal hernia also contains a small portion of sigmoid colon without obstructive or acute inflammatory changes. Tiny fat containing ventral abdominal hernias superior to the  umbilicus. No intraperitoneal free fluid. Musculoskeletal: No acute osseous abnormality or suspicious osseous lesion. IMPRESSION: 1. No acute abnormality identified in the chest, abdomen, or pelvis. 2.  Aortic Atherosclerosis (ICD10-I70.0).  No aneurysm. 3. Unchanged inguinal hernias. Small amount of colon in the left-sided hernia without obstruction or acute inflammatory change. Electronically Signed   By: Logan Bores M.D.   On: 12/12/2017 19:31   Ct Chest Wo Contrast  Result Date: 12/12/2017 CLINICAL DATA:  Left-sided chest pain and abdominal pain beginning this morning. EXAM: CT CHEST, ABDOMEN AND PELVIS WITHOUT CONTRAST TECHNIQUE: Multidetector CT imaging of the chest, abdomen and pelvis was performed following the standard protocol without IV contrast. COMPARISON:  Chest radiograph 12/12/2017. Chest, abdomen, and pelvis CT 06/25/2017. FINDINGS: CT CHEST FINDINGS Cardiovascular: Aortic and three-vessel coronary artery atherosclerosis. Prior CABG and aortic valve replacement. No aortic aneurysm. Mild cardiomegaly. No pericardial effusion. Mediastinum/Nodes: No enlarged axillary, mediastinal, or hilar lymph nodes. Unremarkable thyroid and esophagus. Lungs/Pleura: No pleural effusion or pneumothorax. Unchanged pleural thickening and calcification in the lower left hemithorax consistent with history of talc pleurodesis. Unchanged 4 mm subpleural right upper lobe nodule (series 5, image 34). Mild scarring or atelectasis in both lung bases. Motion artifact mildly limits assessment of the lingula and basilar left lower lobe. There may be mild chronic centrilobular ground-glass nodularity in the left lower lobe, however if present this has not progressed from the prior CT. No consolidative opacities are seen. Musculoskeletal: No acute osseous abnormality or suspicious osseous lesion. Thoracic spondylosis. CT ABDOMEN PELVIS FINDINGS Hepatobiliary: Unchanged right hepatic lobe partial resection or atrophy. No focal  liver abnormality identified. Prior cholecystectomy. No biliary dilatation. Pancreas: Mild fatty infiltration greatest in the head. No ductal dilatation or acute inflammation. Spleen: Unremarkable. Adrenals/Urinary Tract: Unremarkable adrenal glands. Asymmetric, advanced left renal atrophy is unchanged, as is an exophytic 4.8 x 4.0 cm low-density lesion extending posterior to the left kidney consistent with a cyst. Subcentimeter exophytic lesion off the lower pole of the right kidney is unchanged and too small to fully characterize. No renal calculi or hydronephrosis. Moderately distended bladder are. Stomach/Bowel: The stomach is within normal limits. Prior right hemicolectomy. There is left-sided colonic diverticulosis without evidence of diverticulitis. There is no evidence of bowel obstruction. Vascular/Lymphatic: Aortoiliac atherosclerosis without evidence of aneurysm. No enlarged lymph nodes. Reproductive: Unchanged prostate enlargement. Other: Bilateral fat containing inguinal hernias. The left inguinal hernia also contains a small portion of sigmoid colon without obstructive or acute inflammatory changes. Tiny fat containing ventral abdominal hernias superior to the umbilicus. No intraperitoneal free fluid. Musculoskeletal: No acute osseous abnormality or suspicious osseous lesion. IMPRESSION: 1. No acute abnormality identified in the chest, abdomen, or pelvis.  2.  Aortic Atherosclerosis (ICD10-I70.0).  No aneurysm. 3. Unchanged inguinal hernias. Small amount of colon in the left-sided hernia without obstruction or acute inflammatory change. Electronically Signed   By: Logan Bores M.D.   On: 12/12/2017 19:31   Dg Esophagus  Result Date: 12/16/2017 CLINICAL DATA:  Dysphasia, history of proximal esophageal stricture, difficulty swallowing ice chips EXAM: ESOPHOGRAM/BARIUM SWALLOW TECHNIQUE: Single contrast examination was performed using  thin barium. FLUOROSCOPY TIME:  Fluoroscopy Time:  1 minutes 6  seconds Radiation Exposure Index (if provided by the fluoroscopic device): 18.2 mGy Number of Acquired Spot Images: 7 COMPARISON:  None. FINDINGS: Due to patient condition and inability to stand, upright and prone evaluation could not be performed. As such, only single-contrast supine evaluation was performed. No fixed narrowing or stricture of the esophagus. Note: if there is concern for upper cervical web, this would need to be excluded on a lateral view. As the patient was unable to cooperate with an upright evaluation, this could not be performed. However, this may be possible during a speech pathology evaluation in a seated position. Study was prematurely aborted due to tracheobronchial aspiration into the left mainstem bronchus and the left lower lobe. The aspiration elicited a cough reflex. IMPRESSION: Limited evaluation, due to the patient's inability to stand, as well as the study being prematurely aborted. Tracheobronchial aspiration, as described above. Consider speech pathology evaluation as clinically warranted. No fixed narrowing or stricture of the esophagus. However, an upper cervical web would not have been detected during this study. This could be further assessed at time of speech pathology evaluation, if desired. These results were called by telephone at the time of interpretation on 12/16/2017 at 10:35 am to [name]. Marylu Lund , who verbally acknowledged these results. Electronically Signed   By: Julian Hy M.D.   On: 12/16/2017 10:43   Nm Pulmonary Perf And Vent  Result Date: 12/12/2017 CLINICAL DATA:  Shortness of breath, history of hypertension, CHF, flash pulmonary edema, stroke, MI CT, colon cancer, diabetes mellitus, ischemic cardiomyopathy EXAM: NUCLEAR MEDICINE VENTILATION - PERFUSION LUNG SCAN TECHNIQUE: Ventilation images were obtained in multiple projections using inhaled aerosol Tc-65mDTPA. Perfusion images were obtained in multiple projections after intravenous injection of  Tc-941mAA. RADIOPHARMACEUTICALS:  30 mCi Technetium-9964mPA aerosol inhalation and 4 mCi Technetium-48m14m IV COMPARISON:  None. FINDINGS: Ventilation: Mildly diminished ventilation LEFT upper lobe versus RIGHT. Enlargement of cardiac silhouette. No other ventilatory defects. Perfusion: Enlargement of cardiac silhouette. No other segmental or subsegmental perfusion defects. Chest radiograph: LEFT basilar opacity question pneumonia. Enlargement of cardiac silhouette post AVR. IMPRESSION: Very low probability for pulmonary embolism. Electronically Signed   By: MarkLavonia Dana.   On: 12/12/2017 21:26   Dg Chest Port 1 View  Result Date: 12/12/2017 CLINICAL DATA:  Shortness of breath and chest pain EXAM: PORTABLE CHEST 1 VIEW COMPARISON:  October 08, 2017 FINDINGS: There is a focal area of ill-defined opacity in the left base, concerning for focal pneumonia. Lungs elsewhere are clear. Heart is enlarged with pulmonary vascularity within normal limits. Patient is status post aortic valve replacement and coronary artery/ internal mammary bypass grafting. No evident adenopathy. No bone lesions. There is aortic atherosclerosis. IMPRESSION: Ill-defined opacity left base concerning for focal pneumonia. Lungs elsewhere clear. Stable cardiomegaly. Postoperative change is noted. There is aortic atherosclerosis. Aortic Atherosclerosis (ICD10-I70.0). Followup PA and lateral chest radiographs recommended in 3-4 weeks following trial of antibiotic therapy to ensure resolution and exclude underlying malignancy. Electronically Signed   By:  Lowella Grip III M.D.   On: 12/12/2017 13:51   Dg Abd Portable 1v  Result Date: 12/14/2017 CLINICAL DATA:  Small-bowel obstruction EXAM: PORTABLE ABDOMEN - 1 VIEW COMPARISON:  CT from 12/12/2017 FINDINGS: Nonobstructed, nondistended bowel gas pattern without free air. Right lower quadrant surgical clips are present. Splenic arterial calcifications are noted in the left upper quadrant.  There is lower lumbar spondylosis with lower lumbar facet arthropathy. No radio-opaque calculi or other significant radiographic abnormality are seen. IMPRESSION: 1. Nonobstructed, nondistended bowel gas pattern.  No free air. 2. Lumbar spondylosis with lower lumbar facet arthropathy. Electronically Signed   By: Ashley Royalty M.D.   On: 12/14/2017 20:31   Dg Swallowing Func-speech Pathology  Result Date: 12/17/2017 Objective Swallowing Evaluation: Type of Study: MBS-Modified Barium Swallow Study  Patient Details Name: Jewelz Kobus MRN: [id number] Date of Birth: [birth year] Today's Date: 12/17/2017 Time: SLP Start Time (ACUTE ONLY): 8280 -SLP Stop Time (ACUTE ONLY): 1103 SLP Time Calculation (min) (ACUTE ONLY): 21 min Past Medical History: Past Medical History: Diagnosis Date . Anemia  . Anxiety  . Aortic stenosis   a. s/p AVR with 74m Edwards pericardial valve 02/07/12 - post-op course complicated by pleural effusion requring thoracentesis/leg cellulitis 03/2012. . Baker's cyst 07/23/12  Incidental finding of LE venous dopplers . Blood transfusion   NO REACTION TO TRANSFUSION . CAD (coronary artery disease)   a. s/p NSTEMI 12/2011:  LHC - Ostial left main 20%, ostial LAD 50%, mid 60-70%, ostial D1 40% and mid 40%, D2 70%, ostial circumflex occluded, ostial RCA 80-90%, LVEDP was 42. b.  s/p CABG x 3 at time of AVR (LiMA-LAD, SVG-2nd daigonal, SVG-PDA) 02/07/12 (post-op course noted above). . Cancer ([facility] '90's  Colon . Cataract   right eye, hx of . Cellulitis   a. RLE cellulitis 2 months post-operatively after AVR/CABG - serratia marcessans, tx with I&D/antibiotics . CHF (congestive heart failure) ([facility]  . Chronic kidney disease  . Chronic systolic heart failure ([facility]   a. TEE 01/2012: EF 25-30%, diffuse hypokinesis. b. Not on ACEI due to renal insufficiency.;  c. follow up  echo 08/06/12: EF 25%, mod diast dysfxn, AVR ok, mild MR, mod LAE, mild RAE, mild to mod RV systolic dysfunction . Diabetes mellitus   borderline .  Flash pulmonary edema ([place]   Post-cath 12/2011, went into acute pulm edema requiring IV lasix and intubation . GERD (gastroesophageal reflux disease)  . History of colon cancer   s/p colon resection . HLD (hyperlipidemia)  . HTN (hypertension)   primary, [name]. THollace Kinnier. Ischemic cardiomyopathy  . Mitral regurgitation   Moderate by TEE 01/2012 . Myocardial infarction ([facility] 12/2011 . Pleural effusion   a. post-operatively after AVR/CABG s/p thoracentesis 03/2012 yielding 1L serosanguinous fluid. . Stroke ([facility] 10/01  RIght Leg weakness Past Surgical History: Past Surgical History: Procedure Laterality Date . AORTIC VALVE REPLACEMENT  02/07/2012  Procedure: AORTIC VALVE REPLACEMENT (AVR);  Surgeon: BGaye Pollack MD;  [facility]  Service: Open Heart Surgery;  Laterality: N/A; . CARDIAC CATHETERIZATION    1.3.13  stopped breathing, put on ventilator for 5-6 days . CATARACT EXTRACTION    rt . CHEST TUBE INSERTION  07/24/2012  Procedure: INSERTION PLEURAL DRAINAGE CATHETER;  Surgeon: BGaye Pollack MD;  [facility]  Service: Thoracic;  Laterality: Left; . COLON RESECTION  1996 . COLONOSCOPY   . CORONARY ARTERY BYPASS GRAFT  02/07/2012  Procedure: CORONARY ARTERY BYPASS GRAFTING (CABG);  Surgeon: BGaye Pollack MD;  [facility];  Service: Open Heart Surgery;  Laterality: N/A;  CABG x three; using right leg greater saphenous vein harvested endoscopically . ERCP N/A 07/08/2014  Procedure: ENDOSCOPIC RETROGRADE CHOLANGIOPANCREATOGRAPHY (ERCP);  Surgeon: Missy Sabins, MD;  Location: [facility] ENDOSCOPY;  Service: Endoscopy;  Laterality: N/A; . ESOPHAGEAL DILATION   . ESOPHAGOGASTRODUODENOSCOPY N/A 07/03/2014  Procedure: ESOPHAGOGASTRODUODENOSCOPY (EGD);  Surgeon: Arta Silence, MD;  Location: [facility] ENDOSCOPY;  Service: Endoscopy;  Laterality: N/A; . ESOPHAGOSCOPY WITH DILITATION N/A 07/07/2014  Procedure: ESOPHAGOSCOPY WITH DILITATION/SAVARY DILATOR;  Surgeon: Rozetta Nunnery, MD;  Location: [facility] OR;  Service: ENT;   Laterality: N/A; . EYE SURGERY  2009  cataract removed from right eye . INGUINAL HERNIA REPAIR Right 09/13/2015  Procedure: OPEN RIGHT INGUINAL HERNIA;  Surgeon: Mickeal Skinner, MD;  Location: [place];  Service: General;  Laterality: Right; . INSERTION OF MESH Right 09/13/2015  Procedure: INSERTION OF MESH;  Surgeon: Mickeal Skinner, MD;  Location: [place];  Service: General;  Laterality: Right; . LEFT HEART CATHETERIZATION WITH CORONARY ANGIOGRAM N/A 12/13/2011  Procedure: LEFT HEART CATHETERIZATION WITH CORONARY ANGIOGRAM;  Surgeon: Peter M Martinique, MD;  Location: [facility] CATH LAB;  Service: Cardiovascular;  Laterality: N/A; . REMOVAL OF PLEURAL DRAINAGE CATHETER  09/27/2012  Procedure: REMOVAL OF PLEURAL DRAINAGE CATHETER;  Surgeon: Gaye Pollack, MD;  Location: [place];  Service: Thoracic;  Laterality: Left; . TALC PLEURODESIS  08/30/2012  Procedure: Pietro Cassis;  Surgeon: Gaye Pollack, MD;  Location: [place];  Service: Thoracic;  Laterality: Left;  INSERTION OF TALC VIA LEFT PLEURX . TALC PLEURODESIS  09/12/2012  Procedure: Pietro Cassis;  Surgeon: Gaye Pollack, MD;  Location: [place];  Service: Thoracic;  Laterality: Left; . TONSILLECTOMY   HPI: Bradely Rudin a 84 y.o.malewith medical history significant ofcoronary artery disease, dysphagia, esophageal stricture with dilation, CABG, bioprosthetic AVR, anemia, chronic kidney disease stage IV, chronic systolic heart failure and colon cancer presented today with complaints of sudden onset of chest pain. July 24th 2015 ERCP revealed "very tight cervical esophageal web and unable to traverse endoscope, therefore ERCP procedure aborted." EGD done under general anethesia for esophageal dilation. MBS 2016 incomplete epiglottic closure and backflow from cervical esophagus with aspirated thin and penetrated nectar; D2/thin recommended. BSE 10/24/15 regular/thin recommended. Chest Xray 1/2 concerning for pna however chest CT 12/12/17 no acute abnormality  identified in the chest, abdomen, or pelvis.  Subjective: pt alert, cooperative Assessment / Plan / Recommendation CHL IP CLINICAL IMPRESSIONS 12/17/2017 Clinical Impression Pt has a mild oropharyngeal dysphagia with more prominent cervical esophageal issues. He has intermittent penetration and silent aspiration into the airway before the swallow with larger boluses of thin liquids secondary to premature spillage. This is reduced with smaller cup sips, but pt also has retrograde flow from the cervical esophagus back into the pharynx and larynx, requiring cues for more effortful cough to clear this penetration. He has improved oral containment when consuming nectar thick liquids via straw, but all consistencies tested have slow clearance through the cervical esophagus. Heavier boluses still had backflow within the UES, but did not reach all the way back into the larynx. Although pt does say that he can sense when boluses "come back up", he does not show any signs to suggest that he is aware of any airway compromise. SLP provided Min cues for use of chin tuck, but this did not improve airway protection or clearance of mild vallecular residue. Recommend initiation of Dys 2 diet and nectar thick liquids by  straw, using intermittent throat clearing and esophageal/aspiration precautions. Recommend additional f/u with ENT to address cervical esophageal issues. SLP Visit Diagnosis Dysphagia, pharyngoesophageal phase (R13.14) Attention and concentration deficit following -- Frontal lobe and executive function deficit following -- Impact on safety and function Moderate aspiration risk   CHL IP TREATMENT RECOMMENDATION 12/17/2017 Treatment Recommendations Therapy as outlined in treatment plan below   Prognosis 12/17/2017 Prognosis for Safe Diet Advancement Fair Barriers to Reach Goals Time post onset Barriers/Prognosis Comment -- CHL IP DIET RECOMMENDATION 12/17/2017 SLP Diet Recommendations Dysphagia 2 (Fine chop) solids;Nectar thick  liquid Liquid Administration via Straw Medication Administration Crushed with puree Compensations Slow rate;Small sips/bites;Follow solids with liquid;Clear throat intermittently Postural Changes Remain semi-upright after after feeds/meals (Comment);Seated upright at 90 degrees   CHL IP OTHER RECOMMENDATIONS 12/17/2017 Recommended Consults -- Oral Care Recommendations Oral care BID Other Recommendations Order thickener from pharmacy;Prohibited food (jello, ice cream, thin soups);Remove water pitcher   CHL IP FOLLOW UP RECOMMENDATIONS 12/17/2017 Follow up Recommendations (No Data)   CHL IP FREQUENCY AND DURATION 12/17/2017 Speech Therapy Frequency (ACUTE ONLY) min 2x/week Treatment Duration 2 weeks      CHL IP ORAL PHASE 12/17/2017 Oral Phase Impaired Oral - Pudding Teaspoon -- Oral - Pudding Cup -- Oral - Honey Teaspoon -- Oral - Honey Cup -- Oral - Nectar Teaspoon -- Oral - Nectar Cup Premature spillage Oral - Nectar Straw -- Oral - Thin Teaspoon -- Oral - Thin Cup Premature spillage Oral - Thin Straw Premature spillage Oral - Puree WFL Oral - Mech Soft WFL Oral - Regular -- Oral - Multi-Consistency -- Oral - Pill -- Oral Phase - Comment --  CHL IP PHARYNGEAL PHASE 12/17/2017 Pharyngeal Phase Impaired Pharyngeal- Pudding Teaspoon -- Pharyngeal -- Pharyngeal- Pudding Cup -- Pharyngeal -- Pharyngeal- Honey Teaspoon -- Pharyngeal -- Pharyngeal- Honey Cup -- Pharyngeal -- Pharyngeal- Nectar Teaspoon -- Pharyngeal -- Pharyngeal- Nectar Cup Reduced epiglottic inversion;Reduced anterior laryngeal mobility;Reduced laryngeal elevation;Penetration/Aspiration during swallow Pharyngeal Material enters airway, remains ABOVE vocal cords and not ejected out Pharyngeal- Nectar Straw Reduced epiglottic inversion;Reduced anterior laryngeal mobility;Reduced laryngeal elevation;Penetration/Aspiration during swallow Pharyngeal Material enters airway, remains ABOVE vocal cords and not ejected out Pharyngeal- Thin Teaspoon -- Pharyngeal --  Pharyngeal- Thin Cup Reduced epiglottic inversion;Reduced anterior laryngeal mobility;Reduced laryngeal elevation;Penetration/Aspiration before swallow;Penetration/Apiration after swallow Pharyngeal Material enters airway, passes BELOW cords without attempt by patient to eject out (silent aspiration) Pharyngeal- Thin Straw Reduced epiglottic inversion;Reduced anterior laryngeal mobility;Reduced laryngeal elevation;Penetration/Aspiration before swallow Pharyngeal Material enters airway, CONTACTS cords and not ejected out Pharyngeal- Puree Reduced epiglottic inversion;Reduced anterior laryngeal mobility;Reduced laryngeal elevation Pharyngeal -- Pharyngeal- Mechanical Soft Reduced epiglottic inversion;Reduced anterior laryngeal mobility;Reduced laryngeal elevation Pharyngeal -- Pharyngeal- Regular -- Pharyngeal -- Pharyngeal- Multi-consistency -- Pharyngeal -- Pharyngeal- Pill -- Pharyngeal -- Pharyngeal Comment --  CHL IP CERVICAL ESOPHAGEAL PHASE 12/17/2017 Cervical Esophageal Phase Impaired Pudding Teaspoon -- Pudding Cup -- Honey Teaspoon -- Honey Cup -- Nectar Teaspoon -- Nectar Cup Reduced cricopharyngeal relaxation;Esophageal backflow into cervical esophagus Nectar Straw Reduced cricopharyngeal relaxation;Esophageal backflow into cervical esophagus Thin Teaspoon -- Thin Cup Reduced cricopharyngeal relaxation;Esophageal backflow into the pharynx;Esophageal backflow into the larynx Thin Straw Reduced cricopharyngeal relaxation;Esophageal backflow into the pharynx Puree Reduced cricopharyngeal relaxation;Esophageal backflow into cervical esophagus Mechanical Soft Reduced cricopharyngeal relaxation;Esophageal backflow into cervical esophagus Regular -- Multi-consistency -- Pill -- Cervical Esophageal Comment -- CHL IP GO 09/21/2015 Functional Assessment Tool Used clinical judgement Functional Limitations Swallowing Swallow Current Status (U0454) CJ Swallow Goal Status (U9811) CI Swallow Discharge Status (B1478) (None)  Motor Speech Current Status [phone]) (None) Motor Speech Goal Status [phone]) (None) Motor Speech Goal Status [phone]) (None) Spoken Language Comprehension Current Status [phone]) (None) Spoken Language Comprehension Goal Status (O5366) (None) Spoken Language Comprehension Discharge Status [phone]) (None) Spoken Language Expression Current Status [phone]) (None) Spoken Language Expression Goal Status [phone]) (None) Spoken Language Expression Discharge Status [phone]) (None) Attention Current Status (I9518) (None) Attention Goal Status (A4166) (None) Attention Discharge Status [phone]) (None) Memory Current Status (S0109) (None) Memory Goal Status (N2355) (None) Memory Discharge Status (D3220) (None) Voice Current Status (U5427) (None) Voice Goal Status (C6237) (None) Voice Discharge Status (S2831) (None) Other Speech-Language Pathology Functional Limitation Current Status (D1761) (None) Other Speech-Language Pathology Functional Limitation Goal Status (Y0737) (None) Other Speech-Language Pathology Functional Limitation Discharge Status [phone]) (None) Germain Osgood 12/17/2017, 12:21 PM  Germain Osgood, M.A. CCC-SLP [phone]              Microbiology: Recent Results (from the past 240 hour(s))  Culture, blood (routine x 2)     Status: None   Collection Time: 12/12/17  6:15 PM  Result Value Ref Range Status   Specimen Description BLOOD BLOOD RIGHT FOREARM  Final   Special Requests   Final    BOTTLES DRAWN AEROBIC AND ANAEROBIC Blood Culture adequate volume   Culture NO GROWTH 5 DAYS  Final   Report Status 12/17/2017 FINAL  Final  Culture, blood (routine x 2)     Status: None   Collection Time: 12/12/17  6:18 PM  Result Value Ref Range Status   Specimen Description BLOOD LEFT ANTECUBITAL  Final   Special Requests IN PEDIATRIC BOTTLE Blood Culture adequate volume  Final   Culture NO GROWTH 5 DAYS  Final   Report Status 12/17/2017 FINAL  Final     Labs: Basic Metabolic Panel: Recent Labs  Lab  12/15/17 0519 12/16/17 0505 12/17/17 0626 12/18/17 0649 12/19/17 0433  NA 143 141 146* 147* 142  K 3.6 3.3* 3.9 4.0 4.0  CL 101 105 114* 114* 108  CO2 29 28 23 23 25   GLUCOSE 123* 156* 161* 147* 133*  BUN 70* 56* 37* 32* 36*  CREATININE 3.39* 2.77* 2.22* 2.34* 2.62*  CALCIUM 8.8* 8.6* 8.5* 8.8* 8.8*   Liver Function Tests: Recent Labs  Lab 12/17/17 0626  AST 17  ALT 13*  ALKPHOS 39  BILITOT 0.7  PROT 5.7*  ALBUMIN 2.6*   No results for input(s): LIPASE, AMYLASE in the last 168 hours. No results for input(s): AMMONIA in the last 168 hours. CBC: Recent Labs  Lab 12/13/17 0807 12/17/17 0626 12/18/17 0649 12/19/17 0433  WBC 15.8* 5.5 6.2 7.1  NEUTROABS 13.5*  --   --   --   HGB 13.3 11.0* 10.0* 9.4*  HCT 40.3 35.9* 31.6* 30.2*  MCV 100.5* 102.3* 102.6* 101.0*  PLT 148* 120* 126* 124*   Cardiac Enzymes: Recent Labs  Lab 12/12/17 1815 12/12/17 2344 12/13/17 0536  TROPONINI 0.03* 0.03* 0.04*   BNP: BNP (last 3 results) Recent Labs    07/05/17 1517 07/11/17 1734 12/12/17 1335  BNP 529.6* 281.4* 384.0*    ProBNP (last 3 results) No results for input(s): PROBNP in the last 8760 hours.  CBG: Recent Labs  Lab 12/18/17 1657 12/18/17 2112 12/18/17 2333 12/19/17 0737 12/19/17 1109  GLUCAP 116* 213* 157* 144* 175*       Signed:  Alma Friendly, MD [facility] 12/19/2017, 3:45 PM

## 2017-12-19 NOTE — Progress Notes (Signed)
Pt wife at bedside to transport patient home. Tele d/c and IV out. D/c paper signed with question and concerns clear

## 2017-12-20 ENCOUNTER — Telehealth: Payer: Self-pay

## 2017-12-20 DIAGNOSIS — E1122 Type 2 diabetes mellitus with diabetic chronic kidney disease: Secondary | ICD-10-CM | POA: Diagnosis not present

## 2017-12-20 DIAGNOSIS — I130 Hypertensive heart and chronic kidney disease with heart failure and stage 1 through stage 4 chronic kidney disease, or unspecified chronic kidney disease: Secondary | ICD-10-CM | POA: Diagnosis not present

## 2017-12-20 DIAGNOSIS — J690 Pneumonitis due to inhalation of food and vomit: Secondary | ICD-10-CM | POA: Diagnosis not present

## 2017-12-20 DIAGNOSIS — D631 Anemia in chronic kidney disease: Secondary | ICD-10-CM | POA: Diagnosis not present

## 2017-12-20 DIAGNOSIS — I255 Ischemic cardiomyopathy: Secondary | ICD-10-CM | POA: Diagnosis not present

## 2017-12-20 DIAGNOSIS — N179 Acute kidney failure, unspecified: Secondary | ICD-10-CM | POA: Diagnosis not present

## 2017-12-20 DIAGNOSIS — N184 Chronic kidney disease, stage 4 (severe): Secondary | ICD-10-CM | POA: Diagnosis not present

## 2017-12-20 DIAGNOSIS — I5042 Chronic combined systolic (congestive) and diastolic (congestive) heart failure: Secondary | ICD-10-CM | POA: Diagnosis not present

## 2017-12-20 NOTE — Telephone Encounter (Signed)
Transition Care Management Follow-Up Telephone Call   Date discharged and where: [facility] on 12/19/2017  How have you been since you were released from the hospital? Per pt wife. Did ok last night but very slow this morning. He is building back his strength and has home health going to the house.   Any patient concerns? None  Items Reviewed:   Meds: Y  Allergies: Y  Dietary Changes Reviewed: soft diet, thickened liquids  Functional Questionnaire:  Independent-I Dependent-D  ADLs:   Dressing- I    Eating- I   Maintaining continence- Urgent bowls   Transferring- I w/ walker off and on   Transportation- D   Meal Prep- D   Managing Meds- I w/ assist  Confirmed importance and Date/Time of follow-up visits scheduled: Sherrie Mustache, NP 12/31/2017 @ 2:15pm   Confirmed with patient if condition worsens to call PCP or go to the Emergency Dept. Patient was given office number and encouraged to call back with questions or concerns: Yes

## 2017-12-21 DIAGNOSIS — I5042 Chronic combined systolic (congestive) and diastolic (congestive) heart failure: Secondary | ICD-10-CM | POA: Diagnosis not present

## 2017-12-21 DIAGNOSIS — I130 Hypertensive heart and chronic kidney disease with heart failure and stage 1 through stage 4 chronic kidney disease, or unspecified chronic kidney disease: Secondary | ICD-10-CM | POA: Diagnosis not present

## 2017-12-24 DIAGNOSIS — I5042 Chronic combined systolic (congestive) and diastolic (congestive) heart failure: Secondary | ICD-10-CM | POA: Diagnosis not present

## 2017-12-24 DIAGNOSIS — I130 Hypertensive heart and chronic kidney disease with heart failure and stage 1 through stage 4 chronic kidney disease, or unspecified chronic kidney disease: Secondary | ICD-10-CM | POA: Diagnosis not present

## 2017-12-25 DIAGNOSIS — I130 Hypertensive heart and chronic kidney disease with heart failure and stage 1 through stage 4 chronic kidney disease, or unspecified chronic kidney disease: Secondary | ICD-10-CM | POA: Diagnosis not present

## 2017-12-25 DIAGNOSIS — I5042 Chronic combined systolic (congestive) and diastolic (congestive) heart failure: Secondary | ICD-10-CM | POA: Diagnosis not present

## 2017-12-26 ENCOUNTER — Telehealth: Payer: Self-pay

## 2017-12-26 ENCOUNTER — Ambulatory Visit (HOSPITAL_COMMUNITY)
Admit: 2017-12-26 | Discharge: 2017-12-26 | Disposition: A | Discharge status: Home / Self Care | Payer: Medicare Other | Attending: Internal Medicine | Admitting: Internal Medicine

## 2017-12-26 ENCOUNTER — Telehealth (HOSPITAL_COMMUNITY): Payer: Self-pay

## 2017-12-26 VITALS — BP 151/64 | HR 60 | Wt 158.0 lb

## 2017-12-26 DIAGNOSIS — I130 Hypertensive heart and chronic kidney disease with heart failure and stage 1 through stage 4 chronic kidney disease, or unspecified chronic kidney disease: Secondary | ICD-10-CM | POA: Diagnosis not present

## 2017-12-26 DIAGNOSIS — Z953 Presence of xenogenic heart valve: Secondary | ICD-10-CM | POA: Insufficient documentation

## 2017-12-26 DIAGNOSIS — I252 Old myocardial infarction: Secondary | ICD-10-CM | POA: Diagnosis not present

## 2017-12-26 DIAGNOSIS — I2510 Atherosclerotic heart disease of native coronary artery without angina pectoris: Secondary | ICD-10-CM | POA: Diagnosis not present

## 2017-12-26 DIAGNOSIS — Z951 Presence of aortocoronary bypass graft: Secondary | ICD-10-CM | POA: Diagnosis not present

## 2017-12-26 DIAGNOSIS — K219 Gastro-esophageal reflux disease without esophagitis: Secondary | ICD-10-CM | POA: Insufficient documentation

## 2017-12-26 DIAGNOSIS — Z79899 Other long term (current) drug therapy: Secondary | ICD-10-CM | POA: Diagnosis not present

## 2017-12-26 DIAGNOSIS — Z8673 Personal history of transient ischemic attack (TIA), and cerebral infarction without residual deficits: Secondary | ICD-10-CM | POA: Diagnosis not present

## 2017-12-26 DIAGNOSIS — I5022 Chronic systolic (congestive) heart failure: Secondary | ICD-10-CM | POA: Insufficient documentation

## 2017-12-26 DIAGNOSIS — Z7984 Long term (current) use of oral hypoglycemic drugs: Secondary | ICD-10-CM | POA: Diagnosis not present

## 2017-12-26 DIAGNOSIS — Z85038 Personal history of other malignant neoplasm of large intestine: Secondary | ICD-10-CM | POA: Diagnosis not present

## 2017-12-26 DIAGNOSIS — E785 Hyperlipidemia, unspecified: Secondary | ICD-10-CM | POA: Diagnosis not present

## 2017-12-26 DIAGNOSIS — I5042 Chronic combined systolic (congestive) and diastolic (congestive) heart failure: Secondary | ICD-10-CM | POA: Diagnosis not present

## 2017-12-26 DIAGNOSIS — I080 Rheumatic disorders of both mitral and aortic valves: Secondary | ICD-10-CM | POA: Insufficient documentation

## 2017-12-26 DIAGNOSIS — F419 Anxiety disorder, unspecified: Secondary | ICD-10-CM | POA: Insufficient documentation

## 2017-12-26 DIAGNOSIS — N183 Chronic kidney disease, stage 3 unspecified: Secondary | ICD-10-CM

## 2017-12-26 DIAGNOSIS — E1122 Type 2 diabetes mellitus with diabetic chronic kidney disease: Secondary | ICD-10-CM | POA: Insufficient documentation

## 2017-12-26 DIAGNOSIS — Z952 Presence of prosthetic heart valve: Secondary | ICD-10-CM | POA: Insufficient documentation

## 2017-12-26 DIAGNOSIS — N184 Chronic kidney disease, stage 4 (severe): Secondary | ICD-10-CM | POA: Diagnosis not present

## 2017-12-26 DIAGNOSIS — D649 Anemia, unspecified: Secondary | ICD-10-CM | POA: Diagnosis not present

## 2017-12-26 DIAGNOSIS — Z9889 Other specified postprocedural states: Secondary | ICD-10-CM | POA: Insufficient documentation

## 2017-12-26 DIAGNOSIS — Z7982 Long term (current) use of aspirin: Secondary | ICD-10-CM | POA: Insufficient documentation

## 2017-12-26 DIAGNOSIS — I255 Ischemic cardiomyopathy: Secondary | ICD-10-CM | POA: Diagnosis not present

## 2017-12-26 LAB — BASIC METABOLIC PANEL
Anion gap: 11 (ref 5–15)
BUN: 52 mg/dL — ABNORMAL HIGH (ref 6–20)
CALCIUM: 8.7 mg/dL — AB (ref 8.9–10.3)
CHLORIDE: 102 mmol/L (ref 101–111)
CO2: 25 mmol/L (ref 22–32)
CREATININE: 3 mg/dL — AB (ref 0.61–1.24)
GFR calc Af Amer: 20 mL/min — ABNORMAL LOW (ref >60)
GFR calc non Af Amer: 17 mL/min — ABNORMAL LOW (ref >60)
GLUCOSE: 210 mg/dL — AB (ref 65–99)
Potassium: 4.3 mmol/L (ref 3.5–5.1)
Sodium: 138 mmol/L (ref 135–145)

## 2017-12-26 MED ORDER — FUROSEMIDE 20 MG PO TABS: 60.0000 mg | ORAL_TABLET | Freq: Every day | ORAL | 6 refills | Status: DC

## 2017-12-26 NOTE — Telephone Encounter (Signed)
A call was received from [facility] PT requesting a verbal order for gait/strength training.   Verbal order was given for the following:  1 time weekly for 1 week. 2 times weekly for 3 weeks.  1 time weekly for 1 week.   Effective 12-21-17

## 2017-12-26 NOTE — Progress Notes (Signed)
Patient ID: [name] [name], male   DOB: March 04, 1926, 84 y.o.   MRN: [id number]     Advanced Heart Failure Clinic Note   PCP: Hollace Kinnier HF: Dr. Aundra Dubin  GI:Dr [place].   HPI: [name] is an 84 y.o. male with a PMHx of CAD, CABG, bioprosthetic AVR, anemia, CKD stage 4, chronic systolic heart failure. He is not on an ACE or ARB due renal failure.   Admitted to [facility] 4/28 through 04/15/13  with volume overload. Diuresed with IV lasix. He was not discharged on diuretics. Discharge weight 139 pounds.   He had a hernia repair in 10/16 and developed volume overload after.  Lasix was increased but he had progressive dyspnea.  He was readmitted and found to have AKI with anemia and HCAP.  He was treated for PNA and transfused 2 units PRBCs.  He was discharged to short-term rehab and remains there today.   Echo in 10/16 showed EF 40-45%, inferior/inferolateral/inferoseptal akinesis, bioprosthetic AoV with mean gradient 12 mmHg, RV with moderately decreased systolic function, PASP 54 mmHg.   He was admitted in 11/16 with aspiration PNA after inguinal hernia surgery.   He was admitted twice in 7/18, both times with sudden onset dyspnea and chest pressure.  TnI was minimally elevated (0.03-0.05 range).  BNP was elevated.  He improved rapidly with IV diuresis.  V/Q scan was negative.  Echo in 7/18 showed EF 25-30%.  Lasix was increased from 40 mg bid to 60 mg bid then to 80 mg bid.    He was admitted 12/12/2017 with chest pain and shortness of breath. Chest pain was non cardiac. Treated with antibiotics for pneumonia. Diuresed with IV lasix and transitioned to lasix 80 mg daily with extra 40 mg as needed. Discharge weigh 161 pounds .   Today he returns for post hospital follow up. Overall feeling fine. Denies SOB/PND/Orthopnea. Denies abdominal pain. Denies CP. Appetite ok. No fever or chills. Weight at home 154 pounds.  Taking all medications. Followed by [facility].   Labs: 05/28/13 Creatinine 2.37 Potassium  4.3 7/14 Creatinine 2.2 3/15 HCT 35.9 10/16 K 3.5, creatinine 2.82, HCT 29.6 11/16 K 4.2, creatinine 2.2, HCT 33.3, plts 127 4/18 K 4.8, creatinine 2.38 8/18 K 4.6, creatinine 3.13 10/18: K 4.4, creatinine 2.62, hgb 11.6 12/19/2017: K 4 Creatinine 2.62  Past Medical History:  Diagnosis Date  . Anemia   . Anxiety   . Aortic stenosis    a. s/p AVR with 37mm Edwards pericardial valve 02/07/12 - post-op course complicated by pleural effusion requring thoracentesis/leg cellulitis 03/2012.  . Baker's cyst 07/23/12   Incidental finding of LE venous dopplers  . Blood transfusion    NO REACTION TO TRANSFUSION  . CAD (coronary artery disease)    a. s/p NSTEMI 12/2011:  LHC - Ostial left main 20%, ostial LAD 50%, mid 60-70%, ostial D1 40% and mid 40%, D2 70%, ostial circumflex occluded, ostial RCA 80-90%, LVEDP was 42. b.  s/p CABG x 3 at time of AVR (LiMA-LAD, SVG-2nd daigonal, SVG-PDA) 02/07/12 (post-op course noted above).  . Cancer [facility]) '90's   Colon  . Cataract    right eye, hx of  . Cellulitis    a. RLE cellulitis 2 months post-operatively after AVR/CABG - serratia marcessans, tx with I&D/antibiotics  . CHF (congestive heart failure) ([place])   . Chronic kidney disease   . Chronic systolic heart failure ([place])    a. TEE 01/2012: EF 25-30%, diffuse hypokinesis. b. Not on ACEI due to renal  insufficiency.;  c. follow up  echo 08/06/12: EF 25%, mod diast dysfxn, AVR ok, mild MR, mod LAE, mild RAE, mild to mod RV systolic dysfunction  . Diabetes mellitus    borderline  . Flash pulmonary edema ([place])    Post-cath 12/2011, went into acute pulm edema requiring IV lasix and intubation  . GERD (gastroesophageal reflux disease)   . History of colon cancer    s/p colon resection  . HLD (hyperlipidemia)   . HTN (hypertension)    primary, Dr. Hollace Kinnier  . Ischemic cardiomyopathy   . Mitral regurgitation    Moderate by TEE 01/2012  . Myocardial infarction ([place]) 12/2011  . Pleural effusion    a.  post-operatively after AVR/CABG s/p thoracentesis 03/2012 yielding 1L serosanguinous fluid.  . Stroke ([place]) 10/01   RIght Leg weakness    Current Outpatient Medications  Medication Sig Dispense Refill  . acetaminophen (TYLENOL) 325 MG tablet Take 2 tablets (650 mg total) by mouth every 6 (six) hours as needed for mild pain or headache.    Marland Kitchen aspirin EC 81 MG EC tablet Take 1 tablet (81 mg total) by mouth daily. 30 tablet 6  . carvedilol (COREG) 12.5 MG tablet Take 1 tablet (12.5 mg total) by mouth 2 (two) times daily with a meal. 60 tablet 6  . ezetimibe (ZETIA) 10 MG tablet Take 1 tablet (10 mg total) by mouth at bedtime. 30 tablet 6  . furosemide (LASIX) 40 MG tablet Take 2 tablets (80 mg total) by mouth daily. May take additional 40 mg (1 tab) as needed 140 tablet 6  . glimepiride (AMARYL) 1 MG tablet TAKE 1MG  BY MOUTH DAILY WITH BREAKFAST (Patient taking differently: Take 1 mg by mouth daily with breakfast. ) 90 tablet 1  . hydrALAZINE (APRESOLINE) 50 MG tablet Take 1 tablet (50 mg total) by mouth 3 (three) times daily. 90 tablet 6  . HYDROcodone-acetaminophen (NORCO/VICODIN) 5-325 MG tablet Take 1 tablet by mouth every 6 (six) hours as needed for moderate pain. 20 tablet 0  . isosorbide mononitrate (IMDUR) 60 MG 24 hr tablet Take 2 tablets (120 mg total) by mouth daily. (Patient taking differently: Take 60 mg by mouth 2 (two) times daily. ) 180 tablet 3  . Multiple Vitamin (MULTIVITAMIN WITH MINERALS) TABS tablet Take 1 tablet by mouth daily.    . nitroGLYCERIN (NITROSTAT) 0.4 MG SL tablet Place 1 tablet (0.4 mg total) under the tongue every 5 (five) minutes as needed for chest pain. As needed (Patient taking differently: Place 0.4 mg under the tongue every 5 (five) minutes as needed for chest pain. ) 15 tablet 3  . omeprazole (PRILOSEC) 20 MG capsule Take 20 mg by mouth daily.     . tamsulosin (FLOMAX) 0.4 MG CAPS capsule Take one capsule by mouth once daily after dinner (Patient taking  differently: Take 0.4 mg by mouth See admin instructions. 0.4 mg by mouth once a day after dinner) 90 capsule 1  . Travoprost, BAK Free, (TRAVATAN) 0.004 % SOLN ophthalmic solution Place 1 drop into both eyes at bedtime.      No current facility-administered medications for this encounter.      Allergies  Allergen Reactions  . Statins Other (See Comments)    Pain/weakness in legs  . Ambien [Zolpidem Tartrate] Anxiety and Other (See Comments)    Anxiety and hallucinations     Social History   Socioeconomic History  . Marital status: Married    Spouse name: Not on file  .  Number of children: Not on file  . Years of education: Not on file  . Highest education level: Not on file  Social Needs  . Financial resource strain: Not on file  . Food insecurity - worry: Not on file  . Food insecurity - inability: Not on file  . Transportation needs - medical: Not on file  . Transportation needs - non-medical: Not on file  Occupational History  . Occupation: retired owned [employer]  Tobacco Use  . Smoking status: Never Smoker  . Smokeless tobacco: Never Used  Substance and Sexual Activity  . Alcohol use: No  . Drug use: No  . Sexual activity: Not Currently  Other Topics Concern  . Not on file  Social History Narrative   Married Angelita Ingles   Never smoked   Alcohol none   Exercise walking, weights   Living Will, MOST form    Family History  Problem Relation Age of Onset  . Cancer Mother   . Heart attack Father   . Mental illness Brother   . Kidney failure Brother     PHYSICAL EXAM: Vitals:   12/26/17 1057  BP: (!) 151/64  Pulse: 60  SpO2: 98%  Weight: 158 lb (71.7 kg)   Wt Readings from Last 3 Encounters:  12/26/17 158 lb (71.7 kg)  12/19/17 161 lb 6 oz (73.2 kg)  10/09/17 156 lb 4 oz (70.9 kg)   General:  Elderly. Ambulated in the clinic with a walker.  No resp difficulty HEENT: normal Neck: supple. no JVD. Carotids 2+ bilat; no bruits. No lymphadenopathy  or thryomegaly appreciated. Cor: PMI nondisplaced. Regular rate & rhythm. No rubs. 2/6 SEM RUSB. Lungs: clear Abdomen: soft, nontender, nondistended. No hepatosplenomegaly. No bruits or masses. Good bowel sounds. Extremities: no cyanosis, clubbing, rash, edema Neuro: alert & orientedx3, cranial nerves grossly intact. moves all 4 extremities w/o difficulty. Affect pleasant l  ASSESSMENT & PLAN: 1. CAD: S/p CABG.  No S/S ischemia.  - Continue ASA 81 and Zetia 10 mg daily.   - No statin with myalgias. - Continue current beta blocker and Imdur 120 mg daily.   - He does not have sublingual NTG, I will prescribe.  2. Aortic valve replacement: Bioprosthetic. Valve looked stable on 7/18 echo. No change.  3. Chronic systolic CHF: Ischemic cardiomyopathy.  EF 25-30% on 7/18 echo, down from EF 40-45%.  NYHA IIIb. Volume status stable. Continue lasix 40 mg twice a day with extra 40 mg lasix for weight up to 158 pounds.  - Continue coreg 12.5 mg BID - Continue hydralazine 50 mg TID - Continue Imdur 120 mg daily.  - Not on ACEI because of CKD - Check BMET today. .    4. CKD stage IV: - Will not increase HTN meds to preserve renal perfusion.  Repeat BMET today.  5. Abdominal pain- no abdominal pain since discharge.  Has follow up with Dr Paulita Fujita.     Follow up in 6 weeks for Dr Precious Bard, NP  12/26/2017

## 2017-12-26 NOTE — Telephone Encounter (Signed)
Result Notes for Basic metabolic panel   Notes recorded by Effie Berkshire, RN on 12/26/2017 at 3:47 PM EST Reviewed with patient's wife, aware and agreeable, will repeat lab work at PCP appt next week, order faxed, Rx updated in chart and to preferred pharmacy electronically. ------  Notes recorded by Conrad [place], NP on 12/26/2017 at 3:17 PM EST Renal function elevated. Hold lasix for 2 days. Then restart lasix 60 mg daily. Repeat BMET in 7 days.

## 2017-12-26 NOTE — Patient Instructions (Signed)
Routine lab work today. Will notify you of abnormal results, otherwise no news is good news!  NO changes to medication at this time.  Follow up 6 weeks with Dr. Aundra Dubin.  Take all medication as prescribed the day of your appointment. Bring all medications with you to your appointment.  Do the following things EVERYDAY: 1) Weigh yourself in the morning before breakfast. Write it down and keep it in a log. 2) Take your medicines as prescribed 3) Eat low salt foods-Limit salt (sodium) to 2000 mg per day.  4) Stay as active as you can everyday 5) Limit all fluids for the day to less than 2 liters

## 2017-12-27 ENCOUNTER — Telehealth: Payer: Self-pay | Admitting: *Deleted

## 2017-12-27 DIAGNOSIS — I5042 Chronic combined systolic (congestive) and diastolic (congestive) heart failure: Secondary | ICD-10-CM | POA: Diagnosis not present

## 2017-12-27 DIAGNOSIS — I130 Hypertensive heart and chronic kidney disease with heart failure and stage 1 through stage 4 chronic kidney disease, or unspecified chronic kidney disease: Secondary | ICD-10-CM | POA: Diagnosis not present

## 2017-12-27 NOTE — Telephone Encounter (Signed)
Received fax from [facility] [phone] Darrick Grinder, NP requesting to draw BMET during appointment on 12/31/2017 and fax to [phone] DX: I50.22  Placed note on appointment date and placed order in [place] folder. Has appointment with Janett Billow 12/31/17

## 2017-12-28 DIAGNOSIS — I5042 Chronic combined systolic (congestive) and diastolic (congestive) heart failure: Secondary | ICD-10-CM | POA: Diagnosis not present

## 2017-12-28 DIAGNOSIS — I130 Hypertensive heart and chronic kidney disease with heart failure and stage 1 through stage 4 chronic kidney disease, or unspecified chronic kidney disease: Secondary | ICD-10-CM | POA: Diagnosis not present

## 2017-12-31 ENCOUNTER — Encounter: Payer: Self-pay | Admitting: Nurse Practitioner

## 2017-12-31 ENCOUNTER — Ambulatory Visit (INDEPENDENT_AMBULATORY_CARE_PROVIDER_SITE_OTHER): Payer: Medicare Other | Admitting: Nurse Practitioner

## 2017-12-31 VITALS — BP 128/60 | HR 60 | Temp 97.7°F | Wt 161.0 lb

## 2017-12-31 DIAGNOSIS — I5042 Chronic combined systolic (congestive) and diastolic (congestive) heart failure: Secondary | ICD-10-CM

## 2017-12-31 DIAGNOSIS — I130 Hypertensive heart and chronic kidney disease with heart failure and stage 1 through stage 4 chronic kidney disease, or unspecified chronic kidney disease: Secondary | ICD-10-CM | POA: Diagnosis not present

## 2017-12-31 DIAGNOSIS — R079 Chest pain, unspecified: Secondary | ICD-10-CM | POA: Diagnosis not present

## 2017-12-31 DIAGNOSIS — N184 Chronic kidney disease, stage 4 (severe): Secondary | ICD-10-CM

## 2017-12-31 DIAGNOSIS — Z7189 Other specified counseling: Secondary | ICD-10-CM | POA: Diagnosis not present

## 2017-12-31 LAB — BASIC METABOLIC PANEL WITH GFR
BUN/Creatinine Ratio: 16 (calc) (ref 6–22)
BUN: 45 mg/dL — AB (ref 7–25)
CALCIUM: 9.1 mg/dL (ref 8.6–10.3)
CHLORIDE: 103 mmol/L (ref 98–110)
CO2: 29 mmol/L (ref 20–32)
Creat: 2.8 mg/dL — ABNORMAL HIGH (ref 0.70–1.11)
GFR, EST AFRICAN AMERICAN: 22 mL/min/{1.73_m2} — AB (ref >=60)
GFR, EST NON AFRICAN AMERICAN: 19 mL/min/{1.73_m2} — AB (ref >=60)
Glucose, Bld: 112 mg/dL (ref 65–139)
Potassium: 4.6 mmol/L (ref 3.5–5.3)
Sodium: 139 mmol/L (ref 135–146)

## 2017-12-31 NOTE — Progress Notes (Signed)
Careteam: Patient Care Team: Gayland Curry, DO as PCP - General (Geriatric Medicine) Verl Blalock, Marijo Conception, MD (Inactive) (Cardiology) Gayland Curry, DO as Resident (Geriatric Medicine) Kinsinger, Arta Bruce, MD as Consulting Physician (General Surgery) Luberta Mutter, MD as Consulting Physician (Ophthalmology)  Advanced Directive information    Allergies  Allergen Reactions  . Statins Other (See Comments)    Pain/weakness in legs  . Ambien [Zolpidem Tartrate] Anxiety and Other (See Comments)    Anxiety and hallucinations     Chief Complaint  Patient presents with  . Transitions Of Care    12/12/2017 - 12/19/2017 (7 days)     HPI: Patient is a 84 y.o. male seen in the office today for hospital follow up.  Pt with medical history significant ofcoronary artery disease, CABG, bioprosthetic AVR, anemia, chronic kidney disease stage IV, chronic systolic heart failure and colon cancer presented to ED with complaints of sudden onset of chest pain. Pt with multiple ED visit and admissions due to recurrent chest pains. Cardiac workup negative and felt to be related to esophogeal. GI was consulted during hospitalization and given hx of proximal esophageal stricture.  No indication for dilation at this time, likely somatic in nature. Has follow up scheduled with GI tomorrow 01/01/18. Pt also treated with 7 day course of Rocephin, Zithromax, ceftin due to witnessed aspiration of contrast into airway per Radiology during barium swallow which was stopped prematurely secondary to aspiration SLP was following- now cleared for dysphagia 2 diet Not having any recurrent symptoms.  Has home health coming out. Does not have ST coming but wife does not feel like this is necessary.  Wife reports she is very frustrated because they could not find a reason for chest pains.  Has already followed up with cardiology- took him off lasix due to worsening renal function. Has gained 3 lbs by being off lasix for 5  days. No shortness of breath, no edema or swelling noted.  Has never seen nephrologist in the past. - wife does not want to see Wife reports that she does not want to take him back to the hospital anymore due to these chest pains because they never find anything. They have given them an Rx for hydrocodone -apap but they have not had to use because the pains have not returned.  She has changed his diet so hoping that helps and GI appt scheduled for tomorrow.  Review of Systems:  Review of Systems  Constitutional: Positive for malaise/fatigue. Negative for chills and fever.  HENT: Positive for hearing loss. Negative for congestion.   Eyes: Negative for blurred vision.       Glasses  Respiratory: Negative for cough, sputum production, shortness of breath and wheezing.   Cardiovascular: Negative for chest pain and palpitations.  Gastrointestinal: Negative for abdominal pain.  Genitourinary: Negative for dysuria.  Musculoskeletal: Negative for falls.  Skin: Negative for itching and rash.  Neurological: Positive for weakness. Negative for dizziness and loss of consciousness.  Endo/Heme/Allergies: Bruises/bleeds easily.  Psychiatric/Behavioral: Positive for memory loss. Negative for depression. The patient does not have insomnia.     Past Medical History:  Diagnosis Date  . Anemia   . Anxiety   . Aortic stenosis    a. s/p AVR with 33mm Edwards pericardial valve 02/07/12 - post-op course complicated by pleural effusion requring thoracentesis/leg cellulitis 03/2012.  . Baker's cyst 07/23/12   Incidental finding of LE venous dopplers  . Blood transfusion    NO REACTION TO TRANSFUSION  .  CAD (coronary artery disease)    a. s/p NSTEMI 12/2011:  LHC - Ostial left main 20%, ostial LAD 50%, mid 60-70%, ostial D1 40% and mid 40%, D2 70%, ostial circumflex occluded, ostial RCA 80-90%, LVEDP was 42. b.  s/p CABG x 3 at time of AVR (LiMA-LAD, SVG-2nd daigonal, SVG-PDA) 02/07/12 (post-op course noted  above).  . Cancer [facility]) '90's   Colon  . Cataract    right eye, hx of  . Cellulitis    a. RLE cellulitis 2 months post-operatively after AVR/CABG - serratia marcessans, tx with I&D/antibiotics  . CHF (congestive heart failure) ([place])   . Chronic kidney disease   . Chronic systolic heart failure ([place])    a. TEE 01/2012: EF 25-30%, diffuse hypokinesis. b. Not on ACEI due to renal insufficiency.;  c. follow up  echo 08/06/12: EF 25%, mod diast dysfxn, AVR ok, mild MR, mod LAE, mild RAE, mild to mod RV systolic dysfunction  . Diabetes mellitus    borderline  . Flash pulmonary edema ([place])    Post-cath 12/2011, went into acute pulm edema requiring IV lasix and intubation  . GERD (gastroesophageal reflux disease)   . History of colon cancer    s/p colon resection  . HLD (hyperlipidemia)   . HTN (hypertension)    primary, Dr. Hollace Kinnier  . Ischemic cardiomyopathy   . Mitral regurgitation    Moderate by TEE 01/2012  . Myocardial infarction ([place]) 12/2011  . Pleural effusion    a. post-operatively after AVR/CABG s/p thoracentesis 03/2012 yielding 1L serosanguinous fluid.  . Stroke ([place]) 10/01   RIght Leg weakness   Past Surgical History:  Procedure Laterality Date  . AORTIC VALVE REPLACEMENT  02/07/2012   Procedure: AORTIC VALVE REPLACEMENT (AVR);  Surgeon: Gaye Pollack, MD;  Location: [place];  Service: Open Heart Surgery;  Laterality: N/A;  . CARDIAC CATHETERIZATION     1.3.13  stopped breathing, put on ventilator for 5-6 days  . CATARACT EXTRACTION     rt  . CHEST TUBE INSERTION  07/24/2012   Procedure: INSERTION PLEURAL DRAINAGE CATHETER;  Surgeon: Gaye Pollack, MD;  Location: [place];  Service: Thoracic;  Laterality: Left;  . COLON RESECTION  1996  . COLONOSCOPY    . CORONARY ARTERY BYPASS GRAFT  02/07/2012   Procedure: CORONARY ARTERY BYPASS GRAFTING (CABG);  Surgeon: Gaye Pollack, MD;  Location: [place];  Service: Open Heart Surgery;  Laterality: N/A;  CABG x three; using right leg  greater saphenous vein harvested endoscopically  . ERCP N/A 07/08/2014   Procedure: ENDOSCOPIC RETROGRADE CHOLANGIOPANCREATOGRAPHY (ERCP);  Surgeon: Missy Sabins, MD;  Location: [facility] ENDOSCOPY;  Service: Endoscopy;  Laterality: N/A;  . ESOPHAGEAL DILATION    . ESOPHAGOGASTRODUODENOSCOPY N/A 07/03/2014   Procedure: ESOPHAGOGASTRODUODENOSCOPY (EGD);  Surgeon: Arta Silence, MD;  Location: [facility] ENDOSCOPY;  Service: Endoscopy;  Laterality: N/A;  . ESOPHAGOSCOPY WITH DILITATION N/A 07/07/2014   Procedure: ESOPHAGOSCOPY WITH DILITATION/SAVARY DILATOR;  Surgeon: Rozetta Nunnery, MD;  Location: [facility] OR;  Service: ENT;  Laterality: N/A;  . EYE SURGERY  2009   cataract removed from right eye  . INGUINAL HERNIA REPAIR Right 09/13/2015   Procedure: OPEN RIGHT INGUINAL HERNIA;  Surgeon: Mickeal Skinner, MD;  Location: [place];  Service: General;  Laterality: Right;  . INSERTION OF MESH Right 09/13/2015   Procedure: INSERTION OF MESH;  Surgeon: Mickeal Skinner, MD;  Location: [place];  Service: General;  Laterality: Right;  . LEFT HEART CATHETERIZATION WITH  CORONARY ANGIOGRAM N/A 12/13/2011   Procedure: LEFT HEART CATHETERIZATION WITH CORONARY ANGIOGRAM;  Surgeon: Peter M Martinique, MD;  Location: [facility] CATH LAB;  Service: Cardiovascular;  Laterality: N/A;  . REMOVAL OF PLEURAL DRAINAGE CATHETER  09/27/2012   Procedure: REMOVAL OF PLEURAL DRAINAGE CATHETER;  Surgeon: Gaye Pollack, MD;  Location: [place];  Service: Thoracic;  Laterality: Left;  . TALC PLEURODESIS  08/30/2012   Procedure: Pietro Cassis;  Surgeon: Gaye Pollack, MD;  Location: [place];  Service: Thoracic;  Laterality: Left;  INSERTION OF TALC VIA LEFT PLEURX  . TALC PLEURODESIS  09/12/2012   Procedure: Pietro Cassis;  Surgeon: Gaye Pollack, MD;  Location: [place];  Service: Thoracic;  Laterality: Left;  . TONSILLECTOMY     Social History:   reports that  has never smoked. he has never used smokeless tobacco. He reports that he does not drink  alcohol or use drugs.  Family History  Problem Relation Age of Onset  . Cancer Mother   . Heart attack Father   . Mental illness Brother   . Kidney failure Brother     Medications: Patient's Medications  New Prescriptions   No medications on file  Previous Medications   ACETAMINOPHEN (TYLENOL) 325 MG TABLET    Take 2 tablets (650 mg total) by mouth every 6 (six) hours as needed for mild pain or headache.   ASPIRIN EC 81 MG EC TABLET    Take 1 tablet (81 mg total) by mouth daily.   CARVEDILOL (COREG) 12.5 MG TABLET    Take 1 tablet (12.5 mg total) by mouth 2 (two) times daily with a meal.   EZETIMIBE (ZETIA) 10 MG TABLET    Take 1 tablet (10 mg total) by mouth at bedtime.   FUROSEMIDE (LASIX) 20 MG TABLET    Take 3 tablets (60 mg total) by mouth daily. May take additional 40 mg (1 tab) as needed   GLIMEPIRIDE (AMARYL) 1 MG TABLET    TAKE 1MG  BY MOUTH DAILY WITH BREAKFAST   HYDRALAZINE (APRESOLINE) 50 MG TABLET    Take 1 tablet (50 mg total) by mouth 3 (three) times daily.   HYDROCODONE-ACETAMINOPHEN (NORCO/VICODIN) 5-325 MG TABLET    Take 1 tablet by mouth every 6 (six) hours as needed for moderate pain.   ISOSORBIDE MONONITRATE (IMDUR) 60 MG 24 HR TABLET    Take 2 tablets (120 mg total) by mouth daily.   MULTIPLE VITAMIN (MULTIVITAMIN WITH MINERALS) TABS TABLET    Take 1 tablet by mouth daily.   NITROGLYCERIN (NITROSTAT) 0.4 MG SL TABLET    Place 1 tablet (0.4 mg total) under the tongue every 5 (five) minutes as needed for chest pain. As needed   OMEPRAZOLE (PRILOSEC) 20 MG CAPSULE    Take 20 mg by mouth daily.    TAMSULOSIN (FLOMAX) 0.4 MG CAPS CAPSULE    Take one capsule by mouth once daily after dinner   TRAVOPROST, BAK FREE, (TRAVATAN) 0.004 % SOLN OPHTHALMIC SOLUTION    Place 1 drop into both eyes at bedtime.   Modified Medications   No medications on file  Discontinued Medications   No medications on file     Physical Exam:  Vitals:   12/31/17 1411  Weight: 161 lb (73  kg)   Body mass index is 24.48 kg/m.  Physical Exam  Constitutional: No distress.  Frail elderly male, NAD  HENT:  Head: Normocephalic and atraumatic.  Cardiovascular: Normal rate, regular rhythm and intact distal pulses.  Murmur heard. Pulmonary/Chest: Effort normal and breath sounds normal. No respiratory distress. He has no rales.  Abdominal: Soft. Bowel sounds are normal.  Musculoskeletal: Normal range of motion. He exhibits edema (1+ bilaterally).  Neurological: He is alert. He exhibits normal muscle tone.  Short term memory loss; wife provides hx  Skin: Skin is warm and dry.  Psychiatric: He has a normal mood and affect.    Labs reviewed: Basic Metabolic Panel: Recent Labs    12/18/17 0649 12/19/17 0433 12/26/17 1120  NA 147* 142 138  K 4.0 4.0 4.3  CL 114* 108 102  CO2 23 25 25   GLUCOSE 147* 133* 210*  BUN 32* 36* 52*  CREATININE 2.34* 2.62* 3.00*  CALCIUM 8.8* 8.8* 8.7*   Liver Function Tests: Recent Labs    06/26/17 0056 10/08/17 1514 12/17/17 0626  AST 22 24 17   ALT 19 18 13*  ALKPHOS 36* 49 39  BILITOT 0.7 0.8 0.7  PROT 6.1* 6.7 5.7*  ALBUMIN 3.2* 3.4* 2.6*   Recent Labs    06/25/17 1114 10/08/17 1514  LIPASE 25 26   No results for input(s): AMMONIA in the last 8760 hours. CBC: Recent Labs    05/10/17 1300  07/11/17 1734  12/13/17 0807 12/17/17 0626 12/18/17 0649 12/19/17 0433  WBC 6.3   < > 6.1   < > 15.8* 5.5 6.2 7.1  NEUTROABS 3.5  --  2.9  --  13.5*  --   --   --   HGB 11.4*   < > 11.9*   < > 13.3 11.0* 10.0* 9.4*  HCT 35.1*   < > 36.1*   < > 40.3 35.9* 31.6* 30.2*  MCV 99.7*   < > 98.1   < > 100.5* 102.3* 102.6* 101.0*  PLT 124*   < > 93*   < > 148* 120* 126* 124*   < > = values in this interval not displayed.   Lipid Panel: Recent Labs    03/13/17 1000  CHOL 166  HDL 33*  LDLCALC 96  TRIG 183*  CHOLHDL 5.0*   TSH: No results for input(s): TSH in the last 8760 hours. A1C: Lab Results  Component Value Date    HGBA1C 6.1 (H) 12/13/2017     Assessment/Plan 1. Chest pain, unspecified type -no recurrent pain. Has hydrocodone-apap for wife to try if symptoms return. -has follow up scheduled with GI doctor tomorrow.  -conts on imdur and Rx for nitro provided by cardiology - Amb Referral to Palliative Care  2. Chronic combined systolic and diastolic heart failure, NYHA class 2 (HCC) -lasix was stopped due to worsen renal function. Weight up 3 lbs in 5 days, without progressive shortness of breath or swelling. Will follow up labs today. To cont on coreg twice daily - BASIC METABOLIC PANEL WITH GFR - Amb Referral to Palliative Care  3. Chronic kidney disease (CKD), stage IV (severe) (HCC) -ongping worsening of renal disease. Wife does not want referral to nephrologist - Amb Referral to Palliative Care  4. Advanced care planning  -discussed palliative care with pt and wife. She needed more information on this before meeting with them but feels like they could benefit from this service at this time. She does not want to keep going back and forth to the hospital   Next appt: 2 months with Dr Sharee Holster K. Harle Battiest  [facility] [phone] 8 am - 5 pm) [phone] (after hours)

## 2017-12-31 NOTE — Patient Instructions (Addendum)
Palliative care consult placed Keep follow up with GI doctor tomorrow.   Follow up with Dr Mariea Clonts for routine follow up in 2 months.

## 2018-01-01 DIAGNOSIS — R1310 Dysphagia, unspecified: Secondary | ICD-10-CM | POA: Diagnosis not present

## 2018-01-02 DIAGNOSIS — I5042 Chronic combined systolic (congestive) and diastolic (congestive) heart failure: Secondary | ICD-10-CM | POA: Diagnosis not present

## 2018-01-02 DIAGNOSIS — I130 Hypertensive heart and chronic kidney disease with heart failure and stage 1 through stage 4 chronic kidney disease, or unspecified chronic kidney disease: Secondary | ICD-10-CM | POA: Diagnosis not present

## 2018-01-03 DIAGNOSIS — I5042 Chronic combined systolic (congestive) and diastolic (congestive) heart failure: Secondary | ICD-10-CM | POA: Diagnosis not present

## 2018-01-03 DIAGNOSIS — I130 Hypertensive heart and chronic kidney disease with heart failure and stage 1 through stage 4 chronic kidney disease, or unspecified chronic kidney disease: Secondary | ICD-10-CM | POA: Diagnosis not present

## 2018-01-07 DIAGNOSIS — I5042 Chronic combined systolic (congestive) and diastolic (congestive) heart failure: Secondary | ICD-10-CM | POA: Diagnosis not present

## 2018-01-07 DIAGNOSIS — I130 Hypertensive heart and chronic kidney disease with heart failure and stage 1 through stage 4 chronic kidney disease, or unspecified chronic kidney disease: Secondary | ICD-10-CM | POA: Diagnosis not present

## 2018-01-10 DIAGNOSIS — I130 Hypertensive heart and chronic kidney disease with heart failure and stage 1 through stage 4 chronic kidney disease, or unspecified chronic kidney disease: Secondary | ICD-10-CM | POA: Diagnosis not present

## 2018-01-10 DIAGNOSIS — I5042 Chronic combined systolic (congestive) and diastolic (congestive) heart failure: Secondary | ICD-10-CM | POA: Diagnosis not present

## 2018-01-11 DIAGNOSIS — I5042 Chronic combined systolic (congestive) and diastolic (congestive) heart failure: Secondary | ICD-10-CM | POA: Diagnosis not present

## 2018-01-11 DIAGNOSIS — I130 Hypertensive heart and chronic kidney disease with heart failure and stage 1 through stage 4 chronic kidney disease, or unspecified chronic kidney disease: Secondary | ICD-10-CM | POA: Diagnosis not present

## 2018-01-14 DIAGNOSIS — I5042 Chronic combined systolic (congestive) and diastolic (congestive) heart failure: Secondary | ICD-10-CM | POA: Diagnosis not present

## 2018-01-14 DIAGNOSIS — I130 Hypertensive heart and chronic kidney disease with heart failure and stage 1 through stage 4 chronic kidney disease, or unspecified chronic kidney disease: Secondary | ICD-10-CM | POA: Diagnosis not present

## 2018-01-18 ENCOUNTER — Telehealth: Payer: Self-pay | Admitting: *Deleted

## 2018-01-18 NOTE — Telephone Encounter (Signed)
Son called and stated that patient's wife was diagnosed with positive flu by her PCP Dr. Orland Mustard. Her Dr. Burnard Bunting them to call Patient's PCP to get Preventive Tamiflu. Son would like Rx sent to pharmacy.   Pharmacy confirmed.

## 2018-01-20 NOTE — Telephone Encounter (Signed)
I did not know this message was here after leaving the office for meetings; however, Granville Lewis, PA, was on call and called me after pt's "daughter?" called and requested the prescription.  We discussed and Arlo was going to call it in to patient's pharmacy.

## 2018-01-22 DIAGNOSIS — I5042 Chronic combined systolic (congestive) and diastolic (congestive) heart failure: Secondary | ICD-10-CM | POA: Diagnosis not present

## 2018-01-22 DIAGNOSIS — I130 Hypertensive heart and chronic kidney disease with heart failure and stage 1 through stage 4 chronic kidney disease, or unspecified chronic kidney disease: Secondary | ICD-10-CM | POA: Diagnosis not present

## 2018-01-29 ENCOUNTER — Other Ambulatory Visit (HOSPITAL_COMMUNITY): Payer: Self-pay | Admitting: Student

## 2018-01-29 DIAGNOSIS — I5042 Chronic combined systolic (congestive) and diastolic (congestive) heart failure: Secondary | ICD-10-CM | POA: Diagnosis not present

## 2018-01-29 DIAGNOSIS — I130 Hypertensive heart and chronic kidney disease with heart failure and stage 1 through stage 4 chronic kidney disease, or unspecified chronic kidney disease: Secondary | ICD-10-CM | POA: Diagnosis not present

## 2018-01-31 ENCOUNTER — Ambulatory Visit: Payer: Medicare Other | Admitting: Internal Medicine

## 2018-02-02 ENCOUNTER — Other Ambulatory Visit: Payer: Self-pay | Admitting: Internal Medicine

## 2018-02-11 ENCOUNTER — Telehealth: Payer: Self-pay | Admitting: *Deleted

## 2018-02-11 NOTE — Telephone Encounter (Signed)
Called and informed daughter to come with patient to appointment to discuss concern. She agreed and stated that it was towards the end of March.

## 2018-02-11 NOTE — Telephone Encounter (Signed)
Daughter, Early Chars called and left message on clinical intake stating that they had a family meeting with patient this weekend to discuss care concerns and they are having some communication problems. Daughter is wanting you to call her to discuss the concerns they are having and wants to know any information you Kristia Jupiter be able to share with them about his conditions or expectations or options for care. Wanting to speak with Dr. Mariea Clonts directly.   I reviewed patient chart and patient has signed the DPR allowing no one but himself to have access to his information.  I called patient to get his verbal to speak with daughter and had to [facility] to return call. Awaiting call back.

## 2018-02-11 NOTE — Telephone Encounter (Signed)
I recommend we have an appointment with pt, wife and children to discuss his condition overall.  He could sign the release of information then.

## 2018-02-12 ENCOUNTER — Encounter (HOSPITAL_COMMUNITY): Payer: Self-pay | Admitting: Cardiology

## 2018-02-12 ENCOUNTER — Ambulatory Visit (HOSPITAL_COMMUNITY)
Admission: RE | Admit: 2018-02-12 | Discharge: 2018-02-12 | Disposition: A | Discharge status: Home / Self Care | Payer: Medicare Other | Source: Ambulatory Visit | Attending: Cardiology | Admitting: Cardiology

## 2018-02-12 VITALS — BP 144/62 | HR 59 | Wt 162.8 lb

## 2018-02-12 DIAGNOSIS — I255 Ischemic cardiomyopathy: Secondary | ICD-10-CM | POA: Diagnosis not present

## 2018-02-12 DIAGNOSIS — I2510 Atherosclerotic heart disease of native coronary artery without angina pectoris: Secondary | ICD-10-CM | POA: Diagnosis not present

## 2018-02-12 DIAGNOSIS — Z841 Family history of disorders of kidney and ureter: Secondary | ICD-10-CM | POA: Insufficient documentation

## 2018-02-12 DIAGNOSIS — N184 Chronic kidney disease, stage 4 (severe): Secondary | ICD-10-CM | POA: Diagnosis not present

## 2018-02-12 DIAGNOSIS — Z7984 Long term (current) use of oral hypoglycemic drugs: Secondary | ICD-10-CM | POA: Insufficient documentation

## 2018-02-12 DIAGNOSIS — Z8249 Family history of ischemic heart disease and other diseases of the circulatory system: Secondary | ICD-10-CM | POA: Diagnosis not present

## 2018-02-12 DIAGNOSIS — Z7982 Long term (current) use of aspirin: Secondary | ICD-10-CM | POA: Diagnosis not present

## 2018-02-12 DIAGNOSIS — I5042 Chronic combined systolic (congestive) and diastolic (congestive) heart failure: Secondary | ICD-10-CM | POA: Diagnosis not present

## 2018-02-12 DIAGNOSIS — I130 Hypertensive heart and chronic kidney disease with heart failure and stage 1 through stage 4 chronic kidney disease, or unspecified chronic kidney disease: Secondary | ICD-10-CM | POA: Diagnosis not present

## 2018-02-12 DIAGNOSIS — Z953 Presence of xenogenic heart valve: Secondary | ICD-10-CM | POA: Insufficient documentation

## 2018-02-12 DIAGNOSIS — Z952 Presence of prosthetic heart valve: Secondary | ICD-10-CM

## 2018-02-12 DIAGNOSIS — Z818 Family history of other mental and behavioral disorders: Secondary | ICD-10-CM | POA: Insufficient documentation

## 2018-02-12 DIAGNOSIS — Z951 Presence of aortocoronary bypass graft: Secondary | ICD-10-CM | POA: Diagnosis not present

## 2018-02-12 DIAGNOSIS — N183 Chronic kidney disease, stage 3 unspecified: Secondary | ICD-10-CM

## 2018-02-12 DIAGNOSIS — E1122 Type 2 diabetes mellitus with diabetic chronic kidney disease: Secondary | ICD-10-CM | POA: Insufficient documentation

## 2018-02-12 DIAGNOSIS — Z79899 Other long term (current) drug therapy: Secondary | ICD-10-CM | POA: Insufficient documentation

## 2018-02-12 DIAGNOSIS — Z888 Allergy status to other drugs, medicaments and biological substances status: Secondary | ICD-10-CM | POA: Insufficient documentation

## 2018-02-12 DIAGNOSIS — Z85038 Personal history of other malignant neoplasm of large intestine: Secondary | ICD-10-CM | POA: Insufficient documentation

## 2018-02-12 DIAGNOSIS — I69351 Hemiplegia and hemiparesis following cerebral infarction affecting right dominant side: Secondary | ICD-10-CM | POA: Diagnosis not present

## 2018-02-12 DIAGNOSIS — E785 Hyperlipidemia, unspecified: Secondary | ICD-10-CM | POA: Insufficient documentation

## 2018-02-12 DIAGNOSIS — I5022 Chronic systolic (congestive) heart failure: Secondary | ICD-10-CM | POA: Insufficient documentation

## 2018-02-12 DIAGNOSIS — K219 Gastro-esophageal reflux disease without esophagitis: Secondary | ICD-10-CM | POA: Diagnosis not present

## 2018-02-12 DIAGNOSIS — I252 Old myocardial infarction: Secondary | ICD-10-CM | POA: Diagnosis not present

## 2018-02-12 LAB — BASIC METABOLIC PANEL
Anion gap: 10 (ref 5–15)
BUN: 36 mg/dL — AB (ref 6–20)
CALCIUM: 9.1 mg/dL (ref 8.9–10.3)
CHLORIDE: 102 mmol/L (ref 101–111)
CO2: 28 mmol/L (ref 22–32)
CREATININE: 2.48 mg/dL — AB (ref 0.61–1.24)
GFR calc Af Amer: 25 mL/min — ABNORMAL LOW (ref >60)
GFR calc non Af Amer: 21 mL/min — ABNORMAL LOW (ref >60)
GLUCOSE: 138 mg/dL — AB (ref 65–99)
Potassium: 4.4 mmol/L (ref 3.5–5.1)
Sodium: 140 mmol/L (ref 135–145)

## 2018-02-12 MED ORDER — FUROSEMIDE 80 MG PO TABS: 80.0000 mg | ORAL_TABLET | Freq: Every day | ORAL | 3 refills | Status: AC

## 2018-02-12 NOTE — Patient Instructions (Signed)
Increase Furosemide 80 mg  (1 tabs), daily  Labs drawn today (if we do not call you, then your lab work was stable)   Your physician recommends that you return for lab work in: 10 days   Your physician recommends that you schedule a follow-up appointment in: 6 weeks with Dr. Aundra Dubin

## 2018-02-13 ENCOUNTER — Other Ambulatory Visit (HOSPITAL_COMMUNITY): Payer: Self-pay | Admitting: Student

## 2018-02-13 NOTE — Progress Notes (Signed)
Patient ID: Izzy Courville, male   DOB: Feb 25, 1926, 84 y.o.   MRN: [id number]     Advanced Heart Failure Clinic Note   PCP: Hollace Kinnier HF: Dr. Aundra Dubin  GI:Dr [place].   HPI: Mr Rigel is a 84 y.o. male with a PMHx of CAD, CABG, bioprosthetic AVR, anemia, CKD stage 4, chronic systolic heart failure. He is not on an ACE or ARB due renal failure.   Admitted to [facility] 4/28 through 04/15/13  with volume overload. Diuresed with IV lasix. He was not discharged on diuretics. Discharge weight 139 pounds.   He had a hernia repair in 10/16 and developed volume overload after.  Lasix was increased but he had progressive dyspnea.  He was readmitted and found to have AKI with anemia and HCAP.  He was treated for PNA and transfused 2 units PRBCs.  He was discharged to short-term rehab and remains there today.   Echo in 10/16 showed EF 40-45%, inferior/inferolateral/inferoseptal akinesis, bioprosthetic AoV with mean gradient 12 mmHg, RV with moderately decreased systolic function, PASP 54 mmHg.   He was admitted in 11/16 with aspiration PNA after inguinal hernia surgery.   He was admitted twice in 7/18, both times with sudden onset dyspnea and chest pressure.  TnI was minimally elevated (0.03-0.05 range).  BNP was elevated.  He improved rapidly with IV diuresis.  V/Q scan was negative.  Echo in 7/18 showed EF 25-30%.  Lasix was increased from 40 mg bid to 60 mg bid then to 80 mg bid.    He was admitted 12/12/2017 with chest pain and shortness of breath. Chest pain was thought to be non-cardiac. Treated with antibiotics for pneumonia. Diuresed with IV lasix and transitioned to lasix 80 mg daily with extra 40 mg as needed. Discharge weigh 161 pounds.   He returns today for followup of CHF.  Since hospitalization, he has taken extra Lasix on 3 days. Weight is stable at home (brings in log of home weights).  He is short of breath after walking > 100 yards. He tries to walk for exercise daily.  No further chest pain.  No orthopnea/PND.  No lightheadedness or falls.    Labs: 05/28/13 Creatinine 2.37 Potassium 4.3 7/14 Creatinine 2.2 3/15 HCT 35.9 10/16 K 3.5, creatinine 2.82, HCT 29.6 11/16 K 4.2, creatinine 2.2, HCT 33.3, plts 127 4/18 K 4.8, creatinine 2.38 8/18 K 4.6, creatinine 3.13 10/18: K 4.4, creatinine 2.62, hgb 11.6 12/19/2017: K 4 Creatinine 2.62  3/19: K 4.4, creatinine 2.48  Past Medical History:  Diagnosis Date  . Anemia   . Anxiety   . Aortic stenosis    a. s/p AVR with 25mm Edwards pericardial valve 02/07/12 - post-op course complicated by pleural effusion requring thoracentesis/leg cellulitis 03/2012.  . Baker's cyst 07/23/12   Incidental finding of LE venous dopplers  . Blood transfusion    NO REACTION TO TRANSFUSION  . CAD (coronary artery disease)    a. s/p NSTEMI 12/2011:  LHC - Ostial left main 20%, ostial LAD 50%, mid 60-70%, ostial D1 40% and mid 40%, D2 70%, ostial circumflex occluded, ostial RCA 80-90%, LVEDP was 42. b.  s/p CABG x 3 at time of AVR (LiMA-LAD, SVG-2nd daigonal, SVG-PDA) 02/07/12 (post-op course noted above).  . Cancer [facility]) '90's   Colon  . Cataract    right eye, hx of  . Cellulitis    a. RLE cellulitis 2 months post-operatively after AVR/CABG - serratia marcessans, tx with I&D/antibiotics  . CHF (congestive heart failure) ([place])   .  Chronic kidney disease   . Chronic systolic heart failure ([place])    a. TEE 01/2012: EF 25-30%, diffuse hypokinesis. b. Not on ACEI due to renal insufficiency.;  c. follow up  echo 08/06/12: EF 25%, mod diast dysfxn, AVR ok, mild MR, mod LAE, mild RAE, mild to mod RV systolic dysfunction  . Diabetes mellitus    borderline  . Flash pulmonary edema ([place])    Post-cath 12/2011, went into acute pulm edema requiring IV lasix and intubation  . GERD (gastroesophageal reflux disease)   . History of colon cancer    s/p colon resection  . HLD (hyperlipidemia)   . HTN (hypertension)    primary, Dr. Hollace Kinnier  . Ischemic cardiomyopathy     . Mitral regurgitation    Moderate by TEE 01/2012  . Myocardial infarction ([place]) 12/2011  . Pleural effusion    a. post-operatively after AVR/CABG s/p thoracentesis 03/2012 yielding 1L serosanguinous fluid.  . Stroke ([place]) 10/01   RIght Leg weakness    Current Outpatient Medications  Medication Sig Dispense Refill  . acetaminophen (TYLENOL) 325 MG tablet Take 2 tablets (650 mg total) by mouth every 6 (six) hours as needed for mild pain or headache.    Marland Kitchen aspirin EC 81 MG EC tablet Take 1 tablet (81 mg total) by mouth daily. 30 tablet 6  . carvedilol (COREG) 12.5 MG tablet Take 1 tablet (12.5 mg total) by mouth 2 (two) times daily with a meal. 60 tablet 6  . ezetimibe (ZETIA) 10 MG tablet TAKE 1 TABLET(10 MG) BY MOUTH AT BEDTIME 30 tablet 6  . furosemide (LASIX) 80 MG tablet Take 1 tablet (80 mg total) by mouth daily. May take additional 40 mg (1 tab) as needed 35 tablet 3  . glimepiride (AMARYL) 1 MG tablet TAKE 1MG  BY MOUTH DAILY WITH BREAKFAST 90 tablet 1  . glucose blood (FREESTYLE LITE) test strip Use to check sugar once daily. Dx: E11.8 100 each 0  . HYDROcodone-acetaminophen (NORCO/VICODIN) 5-325 MG tablet Take 1 tablet by mouth every 6 (six) hours as needed for moderate pain. 20 tablet 0  . isosorbide mononitrate (IMDUR) 60 MG 24 hr tablet Take 2 tablets (120 mg total) by mouth daily. 180 tablet 3  . Multiple Vitamin (MULTIVITAMIN WITH MINERALS) TABS tablet Take 1 tablet by mouth daily.    . nitroGLYCERIN (NITROSTAT) 0.4 MG SL tablet Place 1 tablet (0.4 mg total) under the tongue every 5 (five) minutes as needed for chest pain. As needed 15 tablet 3  . omeprazole (PRILOSEC) 20 MG capsule Take 20 mg by mouth daily.     . tamsulosin (FLOMAX) 0.4 MG CAPS capsule Take one capsule by mouth once daily after dinner 90 capsule 1  . Travoprost, BAK Free, (TRAVATAN) 0.004 % SOLN ophthalmic solution Place 1 drop into both eyes at bedtime.     . hydrALAZINE (APRESOLINE) 50 MG tablet TAKE 1  TABLET(50 MG) BY MOUTH THREE TIMES DAILY 90 tablet 3   No current facility-administered medications for this encounter.      Allergies  Allergen Reactions  . Statins Other (See Comments)    Pain/weakness in legs  . Ambien [Zolpidem Tartrate] Anxiety and Other (See Comments)    Anxiety and hallucinations     Social History   Socioeconomic History  . Marital status: Married    Spouse name: Not on file  . Number of children: Not on file  . Years of education: Not on file  . Highest education level: Not  on file  Social Needs  . Financial resource strain: Not on file  . Food insecurity - worry: Not on file  . Food insecurity - inability: Not on file  . Transportation needs - medical: Not on file  . Transportation needs - non-medical: Not on file  Occupational History  . Occupation: retired owned [employer]  Tobacco Use  . Smoking status: Never Smoker  . Smokeless tobacco: Never Used  Substance and Sexual Activity  . Alcohol use: No  . Drug use: No  . Sexual activity: Not Currently  Other Topics Concern  . Not on file  Social History Narrative   Married Angelita Ingles   Never smoked   Alcohol none   Exercise walking, weights   Living Will, MOST form    Family History  Problem Relation Age of Onset  . Cancer Mother   . Heart attack Father   . Mental illness Brother   . Kidney failure Brother     PHYSICAL EXAM: Vitals:   02/12/18 1508  BP: (!) 144/62  Pulse: (!) 59  SpO2: 97%  Weight: 162 lb 12.8 oz (73.8 kg)   Wt Readings from Last 3 Encounters:  02/12/18 162 lb 12.8 oz (73.8 kg)  12/31/17 161 lb (73 kg)  12/26/17 158 lb (71.7 kg)   General: NAD Neck: JVP 8 cm, no thyromegaly or thyroid nodule.  Lungs: Slight crackles at bases CV: Nondisplaced PMI.  Heart regular S1/S2, no S3/S4, 2/6 early SEM RUSB.  1+ edema 1/2 up lower legs bilaterally.  No carotid bruit.  Normal pedal pulses.  Abdomen: Soft, nontender, no hepatosplenomegaly, no distention.  Skin:  Intact without lesions or rashes.  Neurologic: Alert and oriented x 3.  Psych: Normal affect. Extremities: No clubbing or cyanosis.  HEENT: Normal.   ASSESSMENT & PLAN: 1. CAD: S/p CABG. No further chest pain.  - Continue ASA 81 and Zetia 10 mg daily.   - No statin with myalgias. - Continue current beta blocker and Imdur 120 mg daily.   2. Aortic valve replacement: Bioprosthetic. Valve looked stable on 7/18 echo.  3. Chronic systolic CHF: Ischemic cardiomyopathy.  EF 25-30% on 7/18 echo, down from EF 40-45%. NYHA III, stable. He looks mildly volume overloaded on exam.  Creatinine today is lower than prior.  - Increase Lasix to 80 mg daily.   - Continue coreg 12.5 mg BID - Continue hydralazine 50 mg TID - Continue Imdur 120 mg daily.  - Not on ACEI/ARB/ARNI/spironolactone because of CKD - Check BMET today and again in 10 days.    4. CKD stage IV:  Creatinine today is lower than in the past.    F/u 6 wks.   Loralie Champagne, MD  02/13/2018

## 2018-02-20 DIAGNOSIS — I509 Heart failure, unspecified: Secondary | ICD-10-CM | POA: Diagnosis not present

## 2018-02-22 ENCOUNTER — Ambulatory Visit (HOSPITAL_COMMUNITY)
Admission: RE | Admit: 2018-02-22 | Discharge: 2018-02-22 | Disposition: A | Discharge status: Home / Self Care | Payer: Medicare Other | Source: Ambulatory Visit | Attending: Internal Medicine | Admitting: Internal Medicine

## 2018-02-22 DIAGNOSIS — I5042 Chronic combined systolic (congestive) and diastolic (congestive) heart failure: Secondary | ICD-10-CM | POA: Diagnosis not present

## 2018-02-22 LAB — BASIC METABOLIC PANEL
ANION GAP: 11 (ref 5–15)
BUN: 42 mg/dL — ABNORMAL HIGH (ref 6–20)
CALCIUM: 9.4 mg/dL (ref 8.9–10.3)
CO2: 26 mmol/L (ref 22–32)
CREATININE: 2.76 mg/dL — AB (ref 0.61–1.24)
Chloride: 104 mmol/L (ref 101–111)
GFR, EST AFRICAN AMERICAN: 22 mL/min — AB (ref >60)
GFR, EST NON AFRICAN AMERICAN: 19 mL/min — AB (ref >60)
GLUCOSE: 134 mg/dL — AB (ref 65–99)
Potassium: 4.6 mmol/L (ref 3.5–5.1)
Sodium: 141 mmol/L (ref 135–145)

## 2018-02-26 ENCOUNTER — Other Ambulatory Visit (HOSPITAL_COMMUNITY): Payer: Self-pay | Admitting: Student

## 2018-03-07 ENCOUNTER — Encounter: Payer: Self-pay | Admitting: Internal Medicine

## 2018-03-07 ENCOUNTER — Ambulatory Visit (INDEPENDENT_AMBULATORY_CARE_PROVIDER_SITE_OTHER): Payer: Medicare Other

## 2018-03-07 ENCOUNTER — Ambulatory Visit (INDEPENDENT_AMBULATORY_CARE_PROVIDER_SITE_OTHER): Payer: Medicare Other | Admitting: Internal Medicine

## 2018-03-07 VITALS — BP 140/62 | HR 67 | Temp 97.6°F | Ht 69.0 in | Wt 163.0 lb

## 2018-03-07 DIAGNOSIS — I25110 Atherosclerotic heart disease of native coronary artery with unstable angina pectoris: Secondary | ICD-10-CM | POA: Diagnosis not present

## 2018-03-07 DIAGNOSIS — I480 Paroxysmal atrial fibrillation: Secondary | ICD-10-CM

## 2018-03-07 DIAGNOSIS — Z0000 Encounter for general adult medical examination without abnormal findings: Secondary | ICD-10-CM | POA: Diagnosis not present

## 2018-03-07 DIAGNOSIS — Z7189 Other specified counseling: Secondary | ICD-10-CM | POA: Diagnosis not present

## 2018-03-07 DIAGNOSIS — N184 Chronic kidney disease, stage 4 (severe): Secondary | ICD-10-CM | POA: Diagnosis not present

## 2018-03-07 DIAGNOSIS — I255 Ischemic cardiomyopathy: Secondary | ICD-10-CM

## 2018-03-07 DIAGNOSIS — I5042 Chronic combined systolic (congestive) and diastolic (congestive) heart failure: Secondary | ICD-10-CM

## 2018-03-07 DIAGNOSIS — E118 Type 2 diabetes mellitus with unspecified complications: Secondary | ICD-10-CM | POA: Diagnosis not present

## 2018-03-07 MED ORDER — ZOSTER VAC RECOMB ADJUVANTED 50 MCG/0.5ML IM SUSR: 0.5000 mL | Freq: Once | INTRAMUSCULAR | 1 refills | Status: AC

## 2018-03-07 NOTE — Patient Instructions (Signed)
[name] [name] , Thank you for taking time to come for your Medicare Wellness Visit. I appreciate your ongoing commitment to your health goals. Please review the following plan we discussed and let me know if I can assist you in the future.   Screening recommendations/referrals: Colonoscopy excluded, you are over age 82 Recommended yearly ophthalmology/optometry visit for glaucoma screening and checkup Recommended yearly dental visit for hygiene and checkup  Vaccinations: Influenza vaccine up to date, due 2019 fall season Pneumococcal vaccine up to date, completed Tdap vaccine up to date, due 08/25/2025 Shingles vaccine due, prescription sent to pharmacy    Advanced directives: in chart  Conditions/risks identified: none  Next appointment: Tyson Dense, RN 03/10/2019 @ 12:45pm  Preventive Care 90 Years and Older, Male Preventive care refers to lifestyle choices and visits with your health care provider that can promote health and wellness. What does preventive care include?  A yearly physical exam. This is also called an annual well check.  Dental exams once or twice a year.  Routine eye exams. Ask your health care provider how often you should have your eyes checked.  Personal lifestyle choices, including:  Daily care of your teeth and gums.  Regular physical activity.  Eating a healthy diet.  Avoiding tobacco and drug use.  Limiting alcohol use.  Practicing safe sex.  Taking low doses of aspirin every day.  Taking vitamin and mineral supplements as recommended by your health care provider. What happens during an annual well check? The services and screenings done by your health care provider during your annual well check will depend on your age, overall health, lifestyle risk factors, and family history of disease. Counseling  Your health care provider may ask you questions about your:  Alcohol use.  Tobacco use.  Drug use.  Emotional well-being.  Home and  relationship well-being.  Sexual activity.  Eating habits.  History of falls.  Memory and ability to understand (cognition).  Work and work Statistician. Screening  You may have the following tests or measurements:  Height, weight, and BMI.  Blood pressure.  Lipid and cholesterol levels. These may be checked every 5 years, or more frequently if you are over 50 years old.  Skin check.  Lung cancer screening. You may have this screening every year starting at age 12 if you have a 30-pack-year history of smoking and currently smoke or have quit within the past 15 years.  Fecal occult blood test (FOBT) of the stool. You may have this test every year starting at age 16.  Flexible sigmoidoscopy or colonoscopy. You may have a sigmoidoscopy every 5 years or a colonoscopy every 10 years starting at age 72.  Prostate cancer screening. Recommendations will vary depending on your family history and other risks.  Hepatitis C blood test.  Hepatitis B blood test.  Sexually transmitted disease (STD) testing.  Diabetes screening. This is done by checking your blood sugar (glucose) after you have not eaten for a while (fasting). You may have this done every 1-3 years.  Abdominal aortic aneurysm (AAA) screening. You may need this if you are a current or former smoker.  Osteoporosis. You may be screened starting at age 28 if you are at high risk. Talk with your health care provider about your test results, treatment options, and if necessary, the need for more tests. Vaccines  Your health care provider may recommend certain vaccines, such as:  Influenza vaccine. This is recommended every year.  Tetanus, diphtheria, and acellular pertussis (Tdap, Td)  vaccine. You may need a Td booster every 10 years.  Zoster vaccine. You may need this after age 42.  Pneumococcal 13-valent conjugate (PCV13) vaccine. One dose is recommended after age 4.  Pneumococcal polysaccharide (PPSV23) vaccine. One  dose is recommended after age 2. Talk to your health care provider about which screenings and vaccines you need and how often you need them. This information is not intended to replace advice given to you by your health care provider. Make sure you discuss any questions you have with your health care provider. Document Released: 12/24/2015 Document Revised: 08/16/2016 Document Reviewed: 09/28/2015 Elsevier Interactive Patient Education  2017 [employer] Prevention in the Home Falls can cause injuries. They can happen to people of all ages. There are many things you can do to make your home safe and to help prevent falls. What can I do on the outside of my home?  Regularly fix the edges of walkways and driveways and fix any cracks.  Remove anything that might make you trip as you walk through a door, such as a raised step or threshold.  Trim any bushes or trees on the path to your home.  Use bright outdoor lighting.  Clear any walking paths of anything that might make someone trip, such as rocks or tools.  Regularly check to see if handrails are loose or broken. Make sure that both sides of any steps have handrails.  Any raised decks and porches should have guardrails on the edges.  Have any leaves, snow, or ice cleared regularly.  Use sand or salt on walking paths during winter.  Clean up any spills in your garage right away. This includes oil or grease spills. What can I do in the bathroom?  Use night lights.  Install grab bars by the toilet and in the tub and shower. Do not use towel bars as grab bars.  Use non-skid mats or decals in the tub or shower.  If you need to sit down in the shower, use a plastic, non-slip stool.  Keep the floor dry. Clean up any water that spills on the floor as soon as it happens.  Remove soap buildup in the tub or shower regularly.  Attach bath mats securely with double-sided non-slip rug tape.  Do not have throw rugs and other  things on the floor that can make you trip. What can I do in the bedroom?  Use night lights.  Make sure that you have a light by your bed that is easy to reach.  Do not use any sheets or blankets that are too big for your bed. They should not hang down onto the floor.  Have a firm chair that has side arms. You can use this for support while you get dressed.  Do not have throw rugs and other things on the floor that can make you trip. What can I do in the kitchen?  Clean up any spills right away.  Avoid walking on wet floors.  Keep items that you use a lot in easy-to-reach places.  If you need to reach something above you, use a strong step stool that has a grab bar.  Keep electrical cords out of the way.  Do not use floor polish or wax that makes floors slippery. If you must use wax, use non-skid floor wax.  Do not have throw rugs and other things on the floor that can make you trip. What can I do with my stairs?  Do not leave  any items on the stairs.  Make sure that there are handrails on both sides of the stairs and use them. Fix handrails that are broken or loose. Make sure that handrails are as long as the stairways.  Check any carpeting to make sure that it is firmly attached to the stairs. Fix any carpet that is loose or worn.  Avoid having throw rugs at the top or bottom of the stairs. If you do have throw rugs, attach them to the floor with carpet tape.  Make sure that you have a light switch at the top of the stairs and the bottom of the stairs. If you do not have them, ask someone to add them for you. What else can I do to help prevent falls?  Wear shoes that:  Do not have high heels.  Have rubber bottoms.  Are comfortable and fit you well.  Are closed at the toe. Do not wear sandals.  If you use a stepladder:  Make sure that it is fully opened. Do not climb a closed stepladder.  Make sure that both sides of the stepladder are locked into place.  Ask  someone to hold it for you, if possible.  Clearly mark and make sure that you can see:  Any grab bars or handrails.  First and last steps.  Where the edge of each step is.  Use tools that help you move around (mobility aids) if they are needed. These include:  Canes.  Walkers.  Scooters.  Crutches.  Turn on the lights when you go into a dark area. Replace any light bulbs as soon as they burn out.  Set up your furniture so you have a clear path. Avoid moving your furniture around.  If any of your floors are uneven, fix them.  If there are any pets around you, be aware of where they are.  Review your medicines with your doctor. Some medicines can make you feel dizzy. This can increase your chance of falling. Ask your doctor what other things that you can do to help prevent falls. This information is not intended to replace advice given to you by your health care provider. Make sure you discuss any questions you have with your health care provider. Document Released: 09/23/2009 Document Revised: 05/04/2016 Document Reviewed: 01/01/2015 Elsevier Interactive Patient Education  2017 [employer].

## 2018-03-07 NOTE — Patient Instructions (Signed)
Call palliative care first if you develop new worrisome symptoms.  I believe they will see you monthly. If you need an appointment or they cannot help you, give [place] a call. Consider getting a CNA to help with the morning routine. Also, think about assisted living.

## 2018-03-07 NOTE — Progress Notes (Signed)
Subjective:   [name] [name] is a 84 y.o. male who presents for Medicare Annual/Subsequent preventive examination.  Last AWV-02/22/2017    Objective:    Vitals: BP 140/62 (BP Location: Left Arm, Patient Position: Sitting)   Pulse 67   Temp 97.6 F (36.4 C) (Oral)   Ht 5\' 9"  (1.753 m)   Wt 163 lb (73.9 kg)   SpO2 93%   BMI 24.07 kg/m   Body mass index is 24.07 kg/m.  Advanced Directives 03/07/2018 12/12/2017 07/11/2017 07/10/2017 07/06/2017 07/05/2017 06/25/2017  Does Patient Have a Medical Advance Directive? Yes Yes Yes Yes Yes Yes Yes  Type of Advance Directive Living will [place];Living will;Out of facility DNR (pink MOST or yellow form) Living will;Out of facility DNR (pink MOST or yellow form) Living will;Out of facility DNR (pink MOST or yellow form) [place];Living will [place];Living will Living will  Does patient want to make changes to medical advance directive? No - Patient declined No - Patient declined - - No - Patient declined - No - Patient declined  Copy of [place] in Chart? - No - copy requested No - copy requested - No - copy requested - -  Would patient like information on creating a medical advance directive? - - - - - - -  Pre-existing out of facility DNR order (yellow form or pink MOST form) - - - - - - -    Tobacco Social History   Tobacco Use  Smoking Status Never Smoker  Smokeless Tobacco Never Used     Counseling given: Not Answered   Clinical Intake:  Pre-visit preparation completed: No  Pain : No/denies pain     Nutritional Risks: None Diabetes: No  How often do you need to have someone help you when you read instructions, pamphlets, or other written materials from your doctor or pharmacy?: 1 - Never What is the last grade level you completed in school?: HS  Interpreter Needed?: No  Information entered by :: Tyson Dense, RN  Past Medical History:    Diagnosis Date  . Anemia   . Anxiety   . Aortic stenosis    a. s/p AVR with 62mm Edwards pericardial valve 02/07/12 - post-op course complicated by pleural effusion requring thoracentesis/leg cellulitis 03/2012.  . [place]'s cyst 07/23/12   Incidental finding of LE venous dopplers  . Blood transfusion    NO REACTION TO TRANSFUSION  . CAD (coronary artery disease)    a. s/p NSTEMI 12/2011:  LHC - Ostial left main 20%, ostial LAD 50%, mid 60-70%, ostial D1 40% and mid 40%, D2 70%, ostial circumflex occluded, ostial RCA 80-90%, LVEDP was 42. b.  s/p CABG x 3 at time of AVR (LiMA-LAD, SVG-2nd daigonal, SVG-PDA) 02/07/12 (post-op course noted above).  . Cancer [facility]) '[facility]'s   Colon  . Cataract    right eye, hx of  . Cellulitis    a. RLE cellulitis 2 months post-operatively after AVR/CABG - serratia marcessans, tx with I&D/antibiotics  . CHF (congestive heart failure) ([place])   . Chronic kidney disease   . Chronic systolic heart failure ([place])    a. TEE 01/2012: EF 25-30%, diffuse hypokinesis. b. Not on ACEI due to renal insufficiency.;  c. follow up  echo 08/06/12: EF 25%, mod diast dysfxn, AVR ok, mild MR, mod LAE, mild RAE, mild to mod RV systolic dysfunction  . Diabetes mellitus    borderline  . Flash pulmonary edema (H[facility])  Post-cath 12/2011, went into acute pulm edema requiring IV lasix and intubation  . GERD (gastroesophageal reflux disease)   . History of colon cancer    s/p colon resection  . HLD (hyperlipidemia)   . HTN (hypertension)    primary, Dr. Hollace Kinnier  . Ischemic cardiomyopathy   . Mitral regurgitation    Moderate by TEE 01/2012  . Myocardial infarction ([place]) 12/2011  . Pleural effusion    a. post-operatively after AVR/CABG s/p thoracentesis 03/2012 yielding 1L serosanguinous fluid.  . Stroke ([place]) 10/01   RIght Leg weakness   Past Surgical History:  Procedure Laterality Date  . AORTIC VALVE REPLACEMENT  02/07/2012   Procedure: AORTIC VALVE REPLACEMENT (AVR);  Surgeon:  Gaye Pollack, MD;  Location: [place];  Service: Open Heart Surgery;  Laterality: N/A;  . CARDIAC CATHETERIZATION     1.3.13  stopped breathing, put on ventilator for 5-6 days  . CATARACT EXTRACTION     rt  . CHEST TUBE INSERTION  07/24/2012   Procedure: INSERTION PLEURAL DRAINAGE CATHETER;  Surgeon: Gaye Pollack, MD;  Location: [place];  Service: Thoracic;  Laterality: Left;  . COLON RESECTION  1996  . COLONOSCOPY    . CORONARY ARTERY BYPASS GRAFT  02/07/2012   Procedure: CORONARY ARTERY BYPASS GRAFTING (CABG);  Surgeon: Gaye Pollack, MD;  Location: [place];  Service: Open Heart Surgery;  Laterality: N/A;  CABG x three; using right leg greater saphenous vein harvested endoscopically  . ERCP N/A 07/08/2014   Procedure: ENDOSCOPIC RETROGRADE CHOLANGIOPANCREATOGRAPHY (ERCP);  Surgeon: Missy Sabins, MD;  [facility];  Service: Endoscopy;  Laterality: N/A;  . ESOPHAGEAL DILATION    . ESOPHAGOGASTRODUODENOSCOPY N/A 07/03/2014   Procedure: ESOPHAGOGASTRODUODENOSCOPY (EGD);  Surgeon: Arta Silence, MD;  Location[facility] ENDOSCOPY;  Service: Endoscopy;  Laterality: N/A;  . ESOPHAGOSCOPY WITH DILITATION N/A 07/07/2014   Procedure: ESOPHAGOSCOPY WITH DILITATION/SAVARY DILATOR;  Surgeon: Rozetta Nunnery, MD;  Location[facility] OR;  Service: ENT;  Laterality: N/A;  . EYE SURGERY  2009   cataract removed from right eye  . INGUINAL HERNIA REPAIR Right 09/13/2015   Procedure: OPEN RIGHT INGUINAL HERNIA;  Surgeon: Mickeal Skinner, MD;  Location: [place];  Service: General;  Laterality: Right;  . INSERTION OF MESH Right 09/13/2015   Procedure: INSERTION OF MESH;  Surgeon: Mickeal Skinner, MD;  Location: [place];  Service: General;  Laterality: Right;  . LEFT HEART CATHETERIZATION WITH CORONARY ANGIOGRAM N/A 12/13/2011   Procedure: LEFT HEART CATHETERIZATION WITH CORONARY ANGIOGRAM;  Surgeon: Peter M Martinique, MD;  Location: [facility] CATH LAB;  Service: Cardiovascular;  Laterality: N/A;  . REMOVAL OF PLEURAL  DRAINAGE CATHETER  09/27/2012   Procedure: REMOVAL OF PLEURAL DRAINAGE CATHETER;  Surgeon: Gaye Pollack, MD;  Location: [place];  Service: Thoracic;  Laterality: Left;  . TALC PLEURODESIS  08/30/2012   Procedure: Pietro Cassis;  Surgeon: Gaye Pollack, MD;  Location: [place];  Service: Thoracic;  Laterality: Left;  INSERTION OF TALC VIA LEFT PLEURX  . TALC PLEURODESIS  09/12/2012   Procedure: Pietro Cassis;  Surgeon: Gaye Pollack, MD;  Location: [place];  Service: Thoracic;  Laterality: Left;  . TONSILLECTOMY     Family History  Problem Relation Age of Onset  . Cancer Mother   . Heart attack Father   . Mental illness Brother   . Kidney failure Brother    Social History   Socioeconomic History  . Marital status: Married    Spouse  name: Not on file  . Number of children: Not on file  . Years of education: Not on file  . Highest education level: Not on file  Occupational History  . Occupation: retired owned [employer]  . Financial resource strain: Not hard at all  . Food insecurity:    Worry: Never true    Inability: Never true  . Transportation needs:    Medical: No    Non-medical: No  Tobacco Use  . Smoking status: Never Smoker  . Smokeless tobacco: Never Used  Substance and Sexual Activity  . Alcohol use: No  . Drug use: No  . Sexual activity: Not Currently  Lifestyle  . Physical activity:    Days per week: 7 days    Minutes per session: 40 min  . Stress: Not at all  Relationships  . Social connections:    Talks on phone: More than three times a week    Gets together: More than three times a week    Attends religious service: 1 to 4 times per year    Active member of club or organization: No    Attends meetings of clubs or organizations: Never    Relationship status: Married  Other Topics Concern  . Not on file  Social History Narrative   Married Angelita Ingles   Never smoked   Alcohol none   Exercise walking, weights   Living Will, MOST  form    Outpatient Encounter Medications as of 03/07/2018  Medication Sig  . acetaminophen (TYLENOL) 325 MG tablet Take 2 tablets (650 mg total) by mouth every 6 (six) hours as needed for mild pain or headache.  Marland Kitchen aspirin EC 81 MG EC tablet Take 1 tablet (81 mg total) by mouth daily.  . carvedilol (COREG) 12.5 MG tablet TAKE 1 TABLET(12.5 MG) BY MOUTH TWICE DAILY WITH A MEAL  . ezetimibe (ZETIA) 10 MG tablet TAKE 1 TABLET(10 MG) BY MOUTH AT BEDTIME  . furosemide (LASIX) 80 MG tablet Take 1 tablet (80 mg total) by mouth daily. May take additional 40 mg (1 tab) as needed  . glimepiride (AMARYL) 1 MG tablet TAKE 1MG  BY MOUTH DAILY WITH BREAKFAST  . glucose blood (FREESTYLE LITE) test strip Use to check sugar once daily. Dx: E11.8  . hydrALAZINE (APRESOLINE) 50 MG tablet TAKE 1 TABLET(50 MG) BY MOUTH THREE TIMES DAILY  . HYDROcodone-acetaminophen (NORCO/VICODIN) 5-325 MG tablet Take 1 tablet by mouth every 6 (six) hours as needed for moderate pain.  . isosorbide mononitrate (IMDUR) 60 MG 24 hr tablet Take 2 tablets (120 mg total) by mouth daily.  . Multiple Vitamin (MULTIVITAMIN WITH MINERALS) TABS tablet Take 1 tablet by mouth daily.  . nitroGLYCERIN (NITROSTAT) 0.4 MG SL tablet Place 1 tablet (0.4 mg total) under the tongue every 5 (five) minutes as needed for chest pain. As needed  . omeprazole (PRILOSEC) 20 MG capsule Take 20 mg by mouth daily.   . tamsulosin (FLOMAX) 0.4 MG CAPS capsule Take one capsule by mouth once daily after dinner  . Travoprost, BAK Free, (TRAVATAN) 0.004 % SOLN ophthalmic solution Place 1 drop into both eyes at bedtime.   Marland Kitchen Zoster Vaccine Adjuvanted [facility]) injection Inject 0.5 mLs into the muscle once for 1 dose.  . [DISCONTINUED] Zoster Vaccine Adjuvanted [facility]) injection Inject 0.5 mLs into the muscle once.   No facility-administered encounter medications on file as of 03/07/2018.     Activities of Daily Living In your present state of health, do  you have  any difficulty performing the following activities: 03/07/2018 12/12/2017  Hearing? Y N  Vision? N N  Difficulty concentrating or making decisions? Y N  Walking or climbing stairs? N N  Dressing or bathing? N N  Doing errands, shopping? N Y  Conservation officer, nature and eating ? N -  Using the Toilet? N -  In the past six months, have you accidently leaked urine? N -  Do you have problems with loss of bowel control? N -  Managing your Medications? Y -  Managing your Finances? N -  Housekeeping or managing your Housekeeping? N -  Some recent data might be hidden    Patient Care Team: Gayland Curry, DO as PCP - General (Geriatric Medicine) Verl Blalock, Marijo Conception, MD (Inactive) (Cardiology) Gayland Curry, DO as Resident (Geriatric Medicine) Kinsinger, Arta Bruce, MD as Consulting Physician (General Surgery) Luberta Mutter, MD as Consulting Physician (Ophthalmology)   Assessment:   This is a routine wellness examination for [place].  Exercise Activities and Dietary recommendations Current Exercise Habits: Home exercise routine, Type of exercise: Other - see comments;walking(PT ecercies), Time (Minutes): 40, Frequency (Times/Week): 7, Weekly Exercise (Minutes/Week): 280  Goals    None      Fall Risk Fall Risk  03/07/2018 12/31/2017 10/01/2017 07/10/2017 05/24/2017  Falls in the past year? No No No No No  Number falls in past yr: - - - - -  Injury with Fall? - - - - -   Is the patient's home free of loose throw rugs in walkways, pet beds, electrical cords, etc?   yes      Grab bars in the bathroom? yes      Handrails on the stairs?   yes      Adequate lighting?   yes   Depression Screen PHQ 2/9 Scores 03/07/2018 12/31/2017 10/01/2017 05/24/2017  PHQ - 2 Score 0 0 0 0    Cognitive Function MMSE - Mini Mental State Exam 03/07/2018 02/22/2017 02/10/2016 06/17/2015  Orientation to time 3 3 2 4   Orientation to Place 3 3 5 5   Registration 3 3 3 3   Attention/ Calculation 3 5 4 4   Recall 0 0 0 0    Language- name 2 objects 2 2 2 2   Language- repeat 1 1 1 1   Language- follow 3 step command 3 3 3 3   Language- read & follow direction 1 1 1 1   Write a sentence 1 1 1 1   Copy design 1 1 1 1   Total score 21 23 23 25         Immunization History  Administered Date(s) Administered  . Influenza Whole 10/02/2013  . Influenza, High Dose Seasonal PF 08/30/2016, 09/11/2017  . Influenza-Unspecified 09/10/2014, 08/12/2015  . Pneumococcal Conjugate-13 02/10/2016  . Pneumococcal Polysaccharide-23 07/30/2013  . Tdap 08/26/2015  . Zoster 05/29/2014    Qualifies for Shingles Vaccine? Yes, educated and prescription sent to pharmacy  Screening Tests Health Maintenance  Topic Date Due  . OPHTHALMOLOGY EXAM  03/25/2016  . FOOT EXAM  05/24/2018  . URINE MICROALBUMIN  05/24/2018  . HEMOGLOBIN A1C  06/12/2018  . TETANUS/TDAP  08/25/2025  . INFLUENZA VACCINE  Completed  . PNA vac Low Risk Adult  Completed   Cancer Screenings: Lung: Low Dose CT Chest recommended if Age 56-80 years, 30 pack-year currently smoking OR have quit w/in 15years. Patient does not qualify. Colorectal: up to date  Additional Screenings:  Hepatitis C Screening: declined Patient has eye appointment schedule Pt declined  hearing screen    Plan:    I have personally reviewed and addressed the Medicare Annual Wellness questionnaire and have noted the following in the patient's chart:  A. Medical and social history B. Use of alcohol, tobacco or illicit drugs  C. Current medications and supplements D. Functional ability and status E.  Nutritional status F.  Physical activity G. Advance directives H. List of other physicians I.  Hospitalizations, surgeries, and ER visits in previous 12 months J.  [place] to include hearing, vision, cognitive, depression L. Referrals and appointments - none  In addition, I have reviewed and discussed with patient certain preventive protocols, quality metrics, and best  practice recommendations. A written personalized care plan for preventive services as well as general preventive health recommendations were provided to patient.  See attached scanned questionnaire for additional information.   Signed,   Tyson Dense, RN Nurse Health Advisor  Patient Concerns: None

## 2018-03-07 NOTE — Progress Notes (Addendum)
Location:  [facility] clinic Provider:  Jalaine Riggenbach L. Mariea Clonts, D.O., C.M.D.  Code Status: DNR  Goals of Care:  Advanced Directives 03/07/2018  Does Patient Have a Medical Advance Directive? Yes  Type of Advance Directive Living will  Does patient want to make changes to medical advance directive? No - Patient declined  Copy of [place] in Chart? -  Would patient like information on creating a medical advance directive? -  Pre-existing out of facility DNR order (yellow form or pink MOST form) -     Chief Complaint  Patient presents with  . Medical Management of Chronic Issues    84mh follow-up    HPI: Patient is a 84y.o. male seen today for medical management of chronic diseases. Accompanied today with wife, daughter and son. He had his wellness visit with SClarise Cruztoday as well. His MMSE from that visit was 21/30. Wife reports she is not concerned with the little changes in memory.  His two children seem more concerned about the cognitive changes.  Pt himself is unaware.  DM: Blood sugar from home checks appears to average 100-130's with occasionally readings below 100 and above. He takes medication well as directed.    Lab Results  Component Value Date   HGBA1C 6.1 (H) 12/13/2017  No lows reported.    CHF: He has some increase in LE edema. Wife reports giving him an extra lasix a couple of times to help with this. He denies SOB, CP, or rate changes he can feel. Taking medications as directed.   He's been hospitalized multiple times over the past 2-3 years with chf and ckd, as well as chest pains.    CKD: Last labs demonstrated stable, but poor function. As per wife she will give him extra lasix to help with fluid removal. No signs of excessive sleepiness or confusion or encephalopathy related to kidney failure.     We discussed ACP today as pt's wife, son and daughter were present.   Past Medical History:  Diagnosis Date  . Anemia   . Anxiety   . Aortic stenosis    a.  s/p AVR with 256mEdwards pericardial valve 02/07/12 - post-op course complicated by pleural effusion requring thoracentesis/leg cellulitis 03/2012.  . Baker's cyst 07/23/12   Incidental finding of LE venous dopplers  . Blood transfusion    NO REACTION TO TRANSFUSION  . CAD (coronary artery disease)    a. s/p NSTEMI 12/2011:  LHC - Ostial left main 20%, ostial LAD 50%, mid 60-70%, ostial D1 40% and mid 40%, D2 70%, ostial circumflex occluded, ostial RCA 80-90%, LVEDP was 42. b.  s/p CABG x 3 at time of AVR (LiMA-LAD, SVG-2nd daigonal, SVG-PDA) 02/07/12 (post-op course noted above).  . [facility]   Colon  . Cataract    right eye, hx of  . Cellulitis    a. RLE cellulitis 2 months post-operatively after AVR/CABG - serratia marcessans, tx with I&D/antibiotics  . CHF (congestive heart failure) ([facility] [place]  . Chronic kidney disease   . Chronic systolic heart failure ([facility]   a. TEE 01/2012: EF 25-30%, diffuse hypokinesis. b. Not on ACEI due to renal insufficiency.;  c. follow up  echo 08/06/12: EF 25%, mod diast dysfxn, AVR ok, mild MR, mod LAE, mild RAE, mild to mod RV systolic dysfunction  . Diabetes mellitus    borderline  . Flash pulmonary edema ([facility] [place]   Post-cath 12/2011, went into acute pulm edema requiring IV lasix  and intubation  . GERD (gastroesophageal reflux disease)   . History of colon cancer    s/p colon resection  . HLD (hyperlipidemia)   . HTN (hypertension)    primary, Dr. Hollace Kinnier  . Ischemic cardiomyopathy   . Mitral regurgitation    Moderate by TEE 01/2012  . Myocardial infarction ([place]) 12/2011  . Pleural effusion    a. post-operatively after AVR/CABG s/p thoracentesis 03/2012 yielding 1L serosanguinous fluid.  . Stroke ([place]) 10/01   RIght Leg weakness    Past Surgical History:  Procedure Laterality Date  . AORTIC VALVE REPLACEMENT  02/07/2012   Procedure: AORTIC VALVE REPLACEMENT (AVR);  Surgeon: Gaye Pollack, MD;  Location: [place];  Service: Open Heart Surgery;   Laterality: N/A;  . CARDIAC CATHETERIZATION     1.3.13  stopped breathing, put on ventilator for 5-6 days  . CATARACT EXTRACTION     rt  . CHEST TUBE INSERTION  07/24/2012   Procedure: INSERTION PLEURAL DRAINAGE CATHETER;  Surgeon: Gaye Pollack, MD;  Location: [place];  Service: Thoracic;  Laterality: Left;  . COLON RESECTION  1996  . COLONOSCOPY    . CORONARY ARTERY BYPASS GRAFT  02/07/2012   Procedure: CORONARY ARTERY BYPASS GRAFTING (CABG);  Surgeon: Gaye Pollack, MD;  Location: [place];  Service: Open Heart Surgery;  Laterality: N/A;  CABG x three; using right leg greater saphenous vein harvested endoscopically  . ERCP N/A 07/08/2014   Procedure: ENDOSCOPIC RETROGRADE CHOLANGIOPANCREATOGRAPHY (ERCP);  Surgeon: Missy Sabins, MD;  [facility];  Service: Endoscopy;  Laterality: N/A;  . ESOPHAGEAL DILATION    . ESOPHAGOGASTRODUODENOSCOPY N/A 07/03/2014   Procedure: ESOPHAGOGASTRODUODENOSCOPY (EGD);  Surgeon: Arta Silence, MD;  Location: [facility] ENDOSCOPY;  Service: Endoscopy;  Laterality: N/A;  . ESOPHAGOSCOPY WITH DILITATION N/A 07/07/2014   Procedure: ESOPHAGOSCOPY WITH DILITATION/SAVARY DILATOR;  Surgeon: Rozetta Nunnery, MD;  Location: [facility] OR;  Service: ENT;  Laterality: N/A;  . EYE SURGERY  2009   cataract removed from right eye  . INGUINAL HERNIA REPAIR Right 09/13/2015   Procedure: OPEN RIGHT INGUINAL HERNIA;  Surgeon: Mickeal Skinner, MD;  Location: [place];  Service: General;  Laterality: Right;  . INSERTION OF MESH Right 09/13/2015   Procedure: INSERTION OF MESH;  Surgeon: Mickeal Skinner, MD;  Location: [place];  Service: General;  Laterality: Right;  . LEFT HEART CATHETERIZATION WITH CORONARY ANGIOGRAM N/A 12/13/2011   Procedure: LEFT HEART CATHETERIZATION WITH CORONARY ANGIOGRAM;  Surgeon: Peter M Martinique, MD;  [facility] C[facility] LAB;  Service: Cardiovascular;  Laterality: N/A;  . REMOVAL OF PLEURAL DRAINAGE CATHETER  09/27/2012   Procedure: REMOVAL OF PLEURAL DRAINAGE  CATHETER;  Surgeon: Gaye Pollack, MD;  Location: [place];  Service: Thoracic;  Laterality: Left;  . TALC PLEURODESIS  08/30/2012   Procedure: Pietro Cassis;  Surgeon: Gaye Pollack, MD;  Location: [place];  Service: Thoracic;  Laterality: Left;  INSERTION OF TALC VIA LEFT PLEURX  . TALC PLEURODESIS  09/12/2012   Procedure: Pietro Cassis;  Surgeon: Gaye Pollack, MD;  Location: [place];  Service: Thoracic;  Laterality: Left;  . TONSILLECTOMY      Allergies  Allergen Reactions  . Statins Other (See Comments)    Pain/weakness in legs  . Ambien [Zolpidem Tartrate] Anxiety and Other (See Comments)    Anxiety and hallucinations     Outpatient Encounter Medications as of 03/07/2018  Medication Sig  . acetaminophen (TYLENOL) 325 MG tablet Take 2 tablets (650  mg total) by mouth every 6 (six) hours as needed for mild pain or headache.  Marland Kitchen aspirin EC 81 MG EC tablet Take 1 tablet (81 mg total) by mouth daily.  . carvedilol (COREG) 12.5 MG tablet TAKE 1 TABLET(12.5 MG) BY MOUTH TWICE DAILY WITH A MEAL  . ezetimibe (ZETIA) 10 MG tablet TAKE 1 TABLET(10 MG) BY MOUTH AT BEDTIME  . furosemide (LASIX) 80 MG tablet Take 1 tablet (80 mg total) by mouth daily. May take additional 40 mg (1 tab) as needed  . glimepiride (AMARYL) 1 MG tablet TAKE 1MG BY MOUTH DAILY WITH BREAKFAST  . glucose blood (FREESTYLE LITE) test strip Use to check sugar once daily. Dx: E11.8  . hydrALAZINE (APRESOLINE) 50 MG tablet TAKE 1 TABLET(50 MG) BY MOUTH THREE TIMES DAILY  . HYDROcodone-acetaminophen (NORCO/VICODIN) 5-325 MG tablet Take 1 tablet by mouth every 6 (six) hours as needed for moderate pain.  . isosorbide mononitrate (IMDUR) 60 MG 24 hr tablet Take 2 tablets (120 mg total) by mouth daily.  . Multiple Vitamin (MULTIVITAMIN WITH MINERALS) TABS tablet Take 1 tablet by mouth daily.  . nitroGLYCERIN (NITROSTAT) 0.4 MG SL tablet Place 1 tablet (0.4 mg total) under the tongue every 5 (five) minutes as needed for chest pain. As  needed  . omeprazole (PRILOSEC) 20 MG capsule Take 20 mg by mouth daily.   . tamsulosin (FLOMAX) 0.4 MG CAPS capsule Take one capsule by mouth once daily after dinner  . Travoprost, BAK Free, (TRAVATAN) 0.004 % SOLN ophthalmic solution Place 1 drop into both eyes at bedtime.   Marland Kitchen Zoster Vaccine Adjuvanted [facility]) injection Inject 0.5 mLs into the muscle once for 1 dose.   No facility-administered encounter medications on file as of 03/07/2018.     Review of Systems:  Review of Systems  Constitutional: Negative for chills, fever and malaise/fatigue.  HENT: Negative for hearing loss.   Eyes:       Glasses  Respiratory: Negative for cough and shortness of breath.   Cardiovascular: Positive for leg swelling. Negative for chest pain and palpitations.       He has some swelling, wife reports given him an extra lasix once or twice   Gastrointestinal: Negative for heartburn, nausea and vomiting.  Genitourinary: Negative for frequency, hematuria and urgency.  Musculoskeletal: Negative.   Neurological: Negative for dizziness, weakness and headaches.  Psychiatric/Behavioral: Positive for memory loss. Negative for depression. The patient is not nervous/anxious and does not have insomnia.     Health Maintenance  Topic Date Due  . OPHTHALMOLOGY EXAM  03/25/2016  . FOOT EXAM  05/24/2018  . URINE MICROALBUMIN  05/24/2018  . HEMOGLOBIN A1C  06/12/2018  . TETANUS/TDAP  08/25/2025  . INFLUENZA VACCINE  Completed  . PNA vac Low Risk Adult  Completed    Physical Exam: Vitals:   03/07/18 1304  BP: 140/62  Pulse: 67  Temp: 97.6 F (36.4 C)  TempSrc: Oral  SpO2: 93%  Weight: 163 lb (73.9 kg)  Height: _0  (1.753 m)   Body mass index is 24.07 kg/m. Physical Exam  Constitutional: He is oriented to person, place, and time. He appears well-developed and well-nourished.  HENT:  Head: Normocephalic.  Cardiovascular: Normal rate, regular rhythm and intact distal pulses.  Pulmonary/Chest:  Effort normal and breath sounds normal.  Abdominal: Soft. Bowel sounds are normal.  Musculoskeletal: Normal range of motion.  Neurological: He is alert and oriented to person, place, and time.  Skin: Skin is warm and dry. Capillary  refill takes less than 2 seconds.  Psychiatric: He has a normal mood and affect. His behavior is normal. Judgment and thought content normal.    Labs reviewed: Basic Metabolic Panel: Recent Labs    12/31/17 1447 02/12/18 1540 02/22/18 1401  NA 139 140 141  K 4.6 4.4 4.6  CL 103 102 104  CO2 _0 GLUCOSE 112 138* 134*  BUN 45* 36* 42*  CREATININE 2.80* 2.48* 2.76*  CALCIUM 9.1 9.1 9.4   Liver Function Tests: Recent Labs    06/26/17 0056 10/08/17 1514 12/17/17 0626  AST _1 ALT 19 18 13*  ALKPHOS 36* 49 39  BILITOT 0.7 0.8 0.7  PROT 6.1* 6.7 5.7*  ALBUMIN 3.2* 3.4* 2.6*   Recent Labs    06/25/17 1114 10/08/17 1514  LIPASE 25 26   No results for input(s): AMMONIA in the last 8760 hours. CBC: Recent Labs    05/10/17 1300  07/11/17 1734  12/13/17 0807 12/17/17 0626 12/18/17 0649 12/19/17 0433  WBC 6.3   < > 6.1   < > 15.8* 5.5 6.2 7.1  NEUTROABS 3.5  --  2.9  --  13.5*  --   --   --   HGB 11.4*   < > 11.9*   < > 13.3 11.0* 10.0* 9.4*  HCT 35.1*   < > 36.1*   < > 40.3 35.9* 31.6* 30.2*  MCV 99.7*   < > 98.1   < > 100.5* 102.3* 102.6* 101.0*  PLT 124*   < > 93*   < > 148* 120* 126* 124*   < > = values in this interval not displayed.   Lipid Panel: Recent Labs    03/13/17 1000  CHOL 166  HDL 33*  LDLCALC 96  TRIG 183*  CHOLHDL 5.0*   Lab Results  Component Value Date   HGBA1C 6.1 (H) 12/13/2017   Most recent hospitalization and appt notes reviewed.  Assessment/Plan 1. Chronic combined systolic and diastolic heart failure, NYHA class 2 (HCC) -mixed etiology, pt with bad combination of conditions making it difficult to maintain a balance -has been well educated on low sodium diet, weight monitoring and early  signs of CHF -family advised to reach out first to palliative care if symptoms develop or to [place] if they are unable to reach the palliative care team -medications kept the same today as above  2. Chronic kidney disease (CKD), stage IV (severe) (HCC) - has progressing over past couple of years, clearly not a candidate for dialysis and pt and family are clear that wishes are more comfort-based - Avoid nephrotoxic agents like nsaids, dose adjust renally excreted meds, hydrate within fluid restriction. - CBC with Differential/Platelet; Future - COMPLETE METABOLIC PANEL WITH GFR; Future  3. Controlled type 2 diabetes mellitus with complication, without long-term current use of insulin ([place]) renal complications - well controlled on current oral agents - CBC with Differential/Platelet; Future - COMPLETE METABOLIC PANEL WITH GFR; Future - Hemoglobin A1c; Future  4. Advance care planning -  Last ACP Note 12/18/2017 to 03/18/2018       ACP (Advance Care Planning) by Gayland Curry, DO at 03/07/2018  1:00 PM    Date of Service   Author Author Type Status Note Type File Time  03/07/2018 Addend Gayland Curry, DO Physician Signed ACP (Advance Care Planning) 03/15/2018             Met today, 03/07/18 with pt, his wife Angelita Ingles, and  his two children, Marcie and Bruce.  Sheryl could not be present.  We reviewed his goals of care.  He has been receiving [facility] from [facility].    We had completed a MOST form in 2016.  We reviewed his latest MOST form that had been completed by the palliative care team in his home unbenounced to me:  DNR, limited additional interventions, abx and fluids if indicated, no tube feeding.  Copies were made to be scanned in his records.  DNR is already on file and order reentered due to his hospitalization.  Living will is also on file in document list.  Contact info for family:   [phone]:  [phone] Bruce:  [phone]  Palliative care  contacts: Jolayne Haines, and Wynell Balloon, ANP-C:  [phone]  We reviewed that in case of changes in his condition, palliative care should be called first followed by our office in order to put changes into place to prevent hospitalization and maintain his comfort.  We reviewed early signs of CHF exacerbation.    30 minutes were spent on ACP today.                       5. Ischemic cardiomyopathy -ongoing, had prior MI and CABG, cont secondary prevention -med list reviewed--may need to reduce some meds over time as goals change  6. PAF (paroxysmal atrial fibrillation) (HCC) -rate controlled, cont beta blocker, asa, , he's quite frail   7. Atherosclerosis of native coronary artery of native heart with unstable angina pectoris ([place]) -cont secondary preventive approaches with asa, bp, lipid, sugar control, has not tolerated statin therapy  Labs/tests ordered:   Orders Placed This Encounter  Procedures  . CBC with Differential/Platelet    Standing Status:   Future    Standing Expiration Date:   07/07/2018  . COMPLETE METABOLIC PANEL WITH GFR    Standing Status:   Future    Standing Expiration Date:   07/07/2018  . Hemoglobin A1c    Standing Status:   Future    Standing Expiration Date:   07/07/2018    Next appt:  05/09/2018    Averly Ericson L. Zayd Bonet, D.O. [facility] [address], [place] [address] Cell Phone (Mon-Fri 8am-5pm):  [phone] On Call:  [phone] & follow prompts after 5pm & weekends Office Phone:  [phone] Office Fax:  [phone]

## 2018-03-07 NOTE — ACP (Advance Care Planning) (Signed)
Met today, 03/07/18 with pt, his wife [name] [name], and his two children, [name] [name] and [name] [name].  Sheryl could not be present.  We reviewed his goals of care.  He has been receiving palliative care services from [facility].    We had completed a MOST form in 2016.  We reviewed his latest MOST form that had been completed by the palliative care team in his home unbenounced to me:  DNR, limited additional interventions, abx and fluids if indicated, no tube feeding.  Copies were made to be scanned in his records.  DNR is already on file and order reentered due to his hospitalization.  Living will is also on file in document list.  Contact info for family:   [phone]:  [phone] [name] [name]:  [phone]  Palliative care contacts: Jolayne Haines, and Wynell Balloon, ANP-C:  [phone]  We reviewed that in case of changes in his condition, palliative care should be called first followed by our office in order to put changes into place to prevent hospitalization and maintain his comfort.  We reviewed early signs of CHF exacerbation.    30 minutes were spent on ACP today.

## 2018-03-10 DIAGNOSIS — R079 Chest pain, unspecified: Secondary | ICD-10-CM | POA: Diagnosis not present

## 2018-03-12 DIAGNOSIS — R079 Chest pain, unspecified: Secondary | ICD-10-CM | POA: Diagnosis not present

## 2018-03-29 ENCOUNTER — Encounter (HOSPITAL_COMMUNITY): Payer: Self-pay | Admitting: Cardiology

## 2018-03-29 ENCOUNTER — Ambulatory Visit (HOSPITAL_COMMUNITY)
Admission: RE | Admit: 2018-03-29 | Discharge: 2018-03-29 | Disposition: A | Discharge status: Home / Self Care | Payer: Medicare Other | Source: Ambulatory Visit | Attending: Cardiology | Admitting: Cardiology

## 2018-03-29 VITALS — BP 144/51 | HR 60 | Wt 163.4 lb

## 2018-03-29 DIAGNOSIS — K219 Gastro-esophageal reflux disease without esophagitis: Secondary | ICD-10-CM | POA: Insufficient documentation

## 2018-03-29 DIAGNOSIS — I5042 Chronic combined systolic (congestive) and diastolic (congestive) heart failure: Secondary | ICD-10-CM | POA: Diagnosis not present

## 2018-03-29 DIAGNOSIS — I255 Ischemic cardiomyopathy: Secondary | ICD-10-CM | POA: Insufficient documentation

## 2018-03-29 DIAGNOSIS — Z888 Allergy status to other drugs, medicaments and biological substances status: Secondary | ICD-10-CM | POA: Insufficient documentation

## 2018-03-29 DIAGNOSIS — Z841 Family history of disorders of kidney and ureter: Secondary | ICD-10-CM | POA: Insufficient documentation

## 2018-03-29 DIAGNOSIS — Z7984 Long term (current) use of oral hypoglycemic drugs: Secondary | ICD-10-CM | POA: Insufficient documentation

## 2018-03-29 DIAGNOSIS — I2510 Atherosclerotic heart disease of native coronary artery without angina pectoris: Secondary | ICD-10-CM | POA: Diagnosis not present

## 2018-03-29 DIAGNOSIS — Z85038 Personal history of other malignant neoplasm of large intestine: Secondary | ICD-10-CM | POA: Insufficient documentation

## 2018-03-29 DIAGNOSIS — Z8673 Personal history of transient ischemic attack (TIA), and cerebral infarction without residual deficits: Secondary | ICD-10-CM | POA: Diagnosis not present

## 2018-03-29 DIAGNOSIS — Z9049 Acquired absence of other specified parts of digestive tract: Secondary | ICD-10-CM | POA: Diagnosis not present

## 2018-03-29 DIAGNOSIS — N179 Acute kidney failure, unspecified: Secondary | ICD-10-CM | POA: Insufficient documentation

## 2018-03-29 DIAGNOSIS — I5022 Chronic systolic (congestive) heart failure: Secondary | ICD-10-CM | POA: Diagnosis not present

## 2018-03-29 DIAGNOSIS — Z809 Family history of malignant neoplasm, unspecified: Secondary | ICD-10-CM | POA: Diagnosis not present

## 2018-03-29 DIAGNOSIS — Z8249 Family history of ischemic heart disease and other diseases of the circulatory system: Secondary | ICD-10-CM | POA: Diagnosis not present

## 2018-03-29 DIAGNOSIS — Z79899 Other long term (current) drug therapy: Secondary | ICD-10-CM | POA: Insufficient documentation

## 2018-03-29 DIAGNOSIS — I130 Hypertensive heart and chronic kidney disease with heart failure and stage 1 through stage 4 chronic kidney disease, or unspecified chronic kidney disease: Secondary | ICD-10-CM | POA: Diagnosis not present

## 2018-03-29 DIAGNOSIS — I252 Old myocardial infarction: Secondary | ICD-10-CM | POA: Insufficient documentation

## 2018-03-29 DIAGNOSIS — N184 Chronic kidney disease, stage 4 (severe): Secondary | ICD-10-CM | POA: Diagnosis not present

## 2018-03-29 DIAGNOSIS — E1122 Type 2 diabetes mellitus with diabetic chronic kidney disease: Secondary | ICD-10-CM | POA: Diagnosis not present

## 2018-03-29 DIAGNOSIS — Z951 Presence of aortocoronary bypass graft: Secondary | ICD-10-CM | POA: Insufficient documentation

## 2018-03-29 DIAGNOSIS — Z9889 Other specified postprocedural states: Secondary | ICD-10-CM | POA: Insufficient documentation

## 2018-03-29 DIAGNOSIS — N183 Chronic kidney disease, stage 3 unspecified: Secondary | ICD-10-CM

## 2018-03-29 DIAGNOSIS — E785 Hyperlipidemia, unspecified: Secondary | ICD-10-CM | POA: Diagnosis not present

## 2018-03-29 DIAGNOSIS — Z7982 Long term (current) use of aspirin: Secondary | ICD-10-CM | POA: Diagnosis not present

## 2018-03-29 DIAGNOSIS — Z953 Presence of xenogenic heart valve: Secondary | ICD-10-CM | POA: Diagnosis not present

## 2018-03-29 DIAGNOSIS — Z952 Presence of prosthetic heart valve: Secondary | ICD-10-CM

## 2018-03-29 LAB — BASIC METABOLIC PANEL
ANION GAP: 12 (ref 5–15)
BUN: 45 mg/dL — ABNORMAL HIGH (ref 6–20)
CHLORIDE: 103 mmol/L (ref 101–111)
CO2: 25 mmol/L (ref 22–32)
Calcium: 9.4 mg/dL (ref 8.9–10.3)
Creatinine, Ser: 2.68 mg/dL — ABNORMAL HIGH (ref 0.61–1.24)
GFR calc Af Amer: 23 mL/min — ABNORMAL LOW (ref >60)
GFR calc non Af Amer: 19 mL/min — ABNORMAL LOW (ref >60)
GLUCOSE: 118 mg/dL — AB (ref 65–99)
POTASSIUM: 4.4 mmol/L (ref 3.5–5.1)
Sodium: 140 mmol/L (ref 135–145)

## 2018-03-29 NOTE — Patient Instructions (Signed)
Labs drawn today (if we do not call you, then your lab work was stable)   Your physician recommends that you schedule a follow-up appointment in: 4 months with Dr. McLean    

## 2018-04-01 NOTE — Progress Notes (Signed)
Patient ID: [name] [name], male   DOB: [birth year], 84 y.o.   MRN: [id number]     Advanced Heart Failure Clinic Note   PCP: Hollace Kinnier HF: Dr. Aundra Dubin  GI:Dr [place].   HPI: [name] [name] is a 84 y.o. male with a PMHx of CAD, CABG, bioprosthetic AVR, anemia, CKD stage 4, chronic systolic heart failure. He is not on an ACE or ARB due renal failure.   Admitted to [facility] 4/28 through 04/15/13  with volume overload. Diuresed with IV lasix. He was not discharged on diuretics. Discharge weight 139 pounds.   He had a hernia repair in 10/16 and developed volume overload after.  Lasix was increased but he had progressive dyspnea.  He was readmitted and found to have AKI with anemia and HCAP.  He was treated for PNA and transfused 2 units PRBCs.  He was discharged to short-term rehab and remains there today.   Echo in 10/16 showed EF 40-45%, inferior/inferolateral/inferoseptal akinesis, bioprosthetic AoV with mean gradient 12 mmHg, RV with moderately decreased systolic function, PASP 54 mmHg.   He was admitted in 11/16 with aspiration PNA after inguinal hernia surgery.   He was admitted twice in 7/18, both times with sudden onset dyspnea and chest pressure.  TnI was minimally elevated (0.03-0.05 range).  BNP was elevated.  He improved rapidly with IV diuresis.  V/Q scan was negative.  Echo in 7/18 showed EF 25-30%.  Lasix was increased from 40 mg bid to 60 mg bid then to 80 mg bid.    He was admitted 12/12/2017 with chest pain and shortness of breath. Chest pain was thought to be non-cardiac. Treated with antibiotics for pneumonia. Diuresed with IV lasix and transitioned to lasix 80 mg daily with extra 40 mg as needed. Discharge weigh 161 pounds.   He returns today for followup of CHF.  He has done better recently, has not been admitted or gone to the ER since 1/19.  Last month, he had a spell of dyspnea + chest tightness that resolved on its own and did not require hospitalization.  He is short of breath after  walking for about 5 minutes.  No chest pain.  No orthopnea/PND.  No lightheadedness or falls.    Labs: 05/28/13 Creatinine 2.37 Potassium 4.3 7/14 Creatinine 2.2 3/15 HCT 35.9 10/16 K 3.5, creatinine 2.82, HCT 29.6 11/16 K 4.2, creatinine 2.2, HCT 33.3, plts 127 4/18 K 4.8, creatinine 2.38 8/18 K 4.6, creatinine 3.13 10/18: K 4.4, creatinine 2.62, hgb 11.6 12/19/2017: K 4 Creatinine 2.62  3/19: K 4.4, creatinine 2.48 => 2.76  Past Medical History:  Diagnosis Date  . Anemia   . Anxiety   . Aortic stenosis    a. s/p AVR with 39mm Edwards pericardial valve 02/07/12 - post-op course complicated by pleural effusion requring thoracentesis/leg cellulitis 03/2012.  . Baker's cyst 07/23/12   Incidental finding of LE venous dopplers  . Blood transfusion    NO REACTION TO TRANSFUSION  . CAD (coronary artery disease)    a. s/p NSTEMI 12/2011:  LHC - Ostial left main 20%, ostial LAD 50%, mid 60-70%, ostial D1 40% and mid 40%, D2 70%, ostial circumflex occluded, ostial RCA 80-90%, LVEDP was 42. b.  s/p CABG x 3 at time of AVR (LiMA-LAD, SVG-2nd daigonal, SVG-PDA) 02/07/12 (post-op course noted above).  . Cancer [facility]) '90's   Colon  . Cataract    right eye, hx of  . Cellulitis    a. RLE cellulitis 2 months post-operatively after AVR/CABG -  serratia marcessans, tx with I&D/antibiotics  . CHF (congestive heart failure) ([place])   . Chronic kidney disease   . Chronic systolic heart failure ([place])    a. TEE 01/2012: EF 25-30%, diffuse hypokinesis. b. Not on ACEI due to renal insufficiency.;  c. follow up  echo 08/06/12: EF 25%, mod diast dysfxn, AVR ok, mild [name], mod LAE, mild RAE, mild to mod RV systolic dysfunction  . Diabetes mellitus    borderline  . Flash pulmonary edema ([place])    Post-cath 12/2011, went into acute pulm edema requiring IV lasix and intubation  . GERD (gastroesophageal reflux disease)   . History of colon cancer    s/p colon resection  . HLD (hyperlipidemia)   . HTN (hypertension)     primary, Dr. Hollace Kinnier  . Ischemic cardiomyopathy   . Mitral regurgitation    Moderate by TEE 01/2012  . Myocardial infarction ([place]) 12/2011  . Pleural effusion    a. post-operatively after AVR/CABG s/p thoracentesis 03/2012 yielding 1L serosanguinous fluid.  . Stroke ([place]) 10/01   RIght Leg weakness    Current Outpatient Medications  Medication Sig Dispense Refill  . acetaminophen (TYLENOL) 325 MG tablet Take 2 tablets (650 mg total) by mouth every 6 (six) hours as needed for mild pain or headache.    Marland Kitchen aspirin EC 81 MG EC tablet Take 1 tablet (81 mg total) by mouth daily. 30 tablet 6  . carvedilol (COREG) 12.5 MG tablet TAKE 1 TABLET(12.5 MG) BY MOUTH TWICE DAILY WITH A MEAL 60 tablet 6  . ezetimibe (ZETIA) 10 MG tablet TAKE 1 TABLET(10 MG) BY MOUTH AT BEDTIME 30 tablet 6  . furosemide (LASIX) 80 MG tablet Take 1 tablet (80 mg total) by mouth daily. May take additional 40 mg (1 tab) as needed 35 tablet 3  . glimepiride (AMARYL) 1 MG tablet TAKE 1MG  BY MOUTH DAILY WITH BREAKFAST 90 tablet 1  . glucose blood (FREESTYLE LITE) test strip Use to check sugar once daily. Dx: E11.8 100 each 0  . hydrALAZINE (APRESOLINE) 50 MG tablet TAKE 1 TABLET(50 MG) BY MOUTH THREE TIMES DAILY 90 tablet 3  . HYDROcodone-acetaminophen (NORCO/VICODIN) 5-325 MG tablet Take 1 tablet by mouth every 6 (six) hours as needed for moderate pain. 20 tablet 0  . isosorbide mononitrate (IMDUR) 60 MG 24 hr tablet Take 2 tablets (120 mg total) by mouth daily. 180 tablet 3  . Multiple Vitamin (MULTIVITAMIN WITH MINERALS) TABS tablet Take 1 tablet by mouth daily.    . nitroGLYCERIN (NITROSTAT) 0.4 MG SL tablet Place 1 tablet (0.4 mg total) under the tongue every 5 (five) minutes as needed for chest pain. As needed 15 tablet 3  . omeprazole (PRILOSEC) 20 MG capsule Take 20 mg by mouth daily.     . tamsulosin (FLOMAX) 0.4 MG CAPS capsule Take one capsule by mouth once daily after dinner 90 capsule 1  . Travoprost, BAK Free,  (TRAVATAN) 0.004 % SOLN ophthalmic solution Place 1 drop into both eyes at bedtime.      No current facility-administered medications for this encounter.      Allergies  Allergen Reactions  . Statins Other (See Comments)    Pain/weakness in legs  . Ambien [Zolpidem Tartrate] Anxiety and Other (See Comments)    Anxiety and hallucinations     Social History   Socioeconomic History  . Marital status: Married    Spouse name: Not on file  . Number of children: Not on file  . Years of  education: Not on file  . Highest education level: Not on file  Occupational History  . Occupation: retired owned [employer]  . Financial resource strain: Not hard at all  . Food insecurity:    Worry: Never true    Inability: Never true  . Transportation needs:    Medical: No    Non-medical: No  Tobacco Use  . Smoking status: Never Smoker  . Smokeless tobacco: Never Used  Substance and Sexual Activity  . Alcohol use: No  . Drug use: No  . Sexual activity: Not Currently  Lifestyle  . Physical activity:    Days per week: 7 days    Minutes per session: 40 min  . Stress: Not at all  Relationships  . Social connections:    Talks on phone: More than three times a week    Gets together: More than three times a week    Attends religious service: 1 to 4 times per year    Active member of club or organization: No    Attends meetings of clubs or organizations: Never    Relationship status: Married  . Intimate partner violence:    Fear of current or ex partner: No    Emotionally abused: No    Physically abused: No    Forced sexual activity: No  Other Topics Concern  . Not on file  Social History Narrative   Married Angelita Ingles   Never smoked   Alcohol none   Exercise walking, weights   Living Will, MOST form    Family History  Problem Relation Age of Onset  . Cancer Mother   . Heart attack Father   . Mental illness Brother   . Kidney failure Brother     PHYSICAL  EXAM: Vitals:   03/29/18 1403  BP: (!) 144/51  Pulse: 60  SpO2: 98%  Weight: 163 lb 6.4 oz (74.1 kg)   Wt Readings from Last 3 Encounters:  03/29/18 163 lb 6.4 oz (74.1 kg)  03/07/18 163 lb (73.9 kg)  03/07/18 163 lb (73.9 kg)   General: NAD Neck: JVP 8 cm, no thyromegaly or thyroid nodule.  Lungs: Clear to auscultation bilaterally with normal respiratory effort. CV: Nondisplaced PMI.  Heart regular S1/S2, no S3/S4, 1/6 SEM RUSB.  No peripheral edema.  No carotid bruit.  Normal pedal pulses.  Abdomen: Soft, nontender, no hepatosplenomegaly, no distention.  Skin: Intact without lesions or rashes.  Neurologic: Alert and oriented x 3.  Psych: Normal affect. Extremities: No clubbing or cyanosis.  HEENT: Normal.   ASSESSMENT & PLAN: 1. CAD: S/p CABG. No further chest pain.  - Continue ASA 81 and Zetia 10 mg daily.   - No statin with myalgias. - Continue current beta blocker and Imdur 120 mg daily.   2. Aortic valve replacement: Bioprosthetic. Valve looked stable on 7/18 echo.  3. Chronic systolic CHF: Ischemic cardiomyopathy.  EF 25-30% on 7/18 echo, down from EF 40-45%. NYHA class III symptoms, stable. He looks at most mildly volume overloaded on exam.   - Continue Lasix 80 mg daily.   - Continue coreg 12.5 mg BID - Continue hydralazine 50 mg TID - Continue Imdur 120 mg daily.  - Not on ACEI/ARB/ARNI/spironolactone because of CKD - Check BMET today.    4. CKD stage IV:  BMET today.    F/u 4 months.   Loralie Champagne, MD  04/01/2018

## 2018-04-08 ENCOUNTER — Telehealth: Payer: Self-pay | Admitting: Licensed Clinical Social Worker

## 2018-04-08 NOTE — Telephone Encounter (Signed)
Palliative Care SW and RN, Daryl Eastern, phoned patient's home and spoke with his wife.  Visit is scheduled for 1pm on Friday, 04/12/18.

## 2018-04-09 ENCOUNTER — Telehealth: Payer: Self-pay | Admitting: Licensed Clinical Social Worker

## 2018-04-09 NOTE — Telephone Encounter (Signed)
Palliative Care SW phoned patient's wife, Angelita Ingles, and set up a home visit for Friday, 04/12/18, at 1pm.

## 2018-04-12 ENCOUNTER — Encounter: Payer: Self-pay | Admitting: *Deleted

## 2018-04-12 ENCOUNTER — Other Ambulatory Visit: Payer: Medicare Other | Admitting: *Deleted

## 2018-04-12 ENCOUNTER — Other Ambulatory Visit: Payer: Medicare Other | Admitting: Licensed Clinical Social Worker

## 2018-04-12 DIAGNOSIS — Z515 Encounter for palliative care: Secondary | ICD-10-CM

## 2018-04-12 NOTE — Progress Notes (Signed)
COMMUNITY PALLIATIVE CARE RN NOTE  PATIENT NAME: [name] [name] DOB: [birth year] MRN: [id number]  PRIMARY CARE PROVIDER: Gayland Curry, DO  RESPONSIBLE PARTY:  Acct ID - Guarantor Home Phone Work Phone Relationship Acct Type  [id number] - [place],[phone]  Self P/F     [address], [address], [place],  [address]    PLAN OF CARE and INTERVENTION:  1. ADVANCE CARE PLANNING/GOALS OF CARE: DNR, MOST form completed. Wants to remain at home with no hospitalizations if possible 2. PATIENT/CAREGIVER EDUCATION: Explained Palliative Services and Reinforced Safety and Aspiration Precautions 3. PERSONAL EMERGENCY PLAN: For pain: Wife will give patient Hydrocodone as prescribed and Nitroglycerin for chest pain. For CHF/edema: If patient gains 2-3 lbs or more, wife will give an extra 40mg  dose of Lasix. Weakness: Patient will utilize walker on more weaker/unsteady days. Exertional dyspnea: Patient will take rest periods as needed and focus on energy conservation  4. DISEASE STATUS: Joint visit made with Palliative SW. Upon arrival, patient sitting in his recliner. Very pleasant and engaging. Denies any pain. Wife reports that she has only had to give patient Hydrocodone x 1 dose since it was ordered. No recent c/o chest pain. Patient is able to ambulate in his home without assistance and feels that he does not require a walker on a daily basis. Reports ambulating in the hallway of his home for exercise down and back 10 times and also performs daily upper and lower body exercises while sitting in his chair along with utilizing hand weights. Patient is able to dress toilet, bath and dress himself, however wife stays nearby in case patient requires assistance. Wife has to place shoes on patient. Patient reports Right leg is weak at times causing patient to have to rest. Wife does the household chores and shops for groceries, while patient stays at home. Occasional dyspnea noted at times during  conversation, but subsides within seconds at rest. Due to dysphagia, patient is on a diet with chopped meats with gravy. Drinks are nectar thick consistency. Medications are crushed and given in applesauce. Usually eats 100% of a meal, 3 meals a day. Blood sugars checked daily prior to breakfast and ranges from 110s-130s. Weighs every morning. Current wt 158.8 lbs. Gives extra Lasix if a 2-3lb wt gain. Does not require often. Weights have been stable for the past 2 months between 157 and 160 lbs.     HISTORY OF PRESENT ILLNESS:  This is a 84 yo male with a diagnosis of systolic/diastolic heart failure who resides at home with his wife. Condition appears to be stable at this time. Palliative to continue to follow patient and will visit patient on a monthly basis.  CODE STATUS: DNR  ADVANCED DIRECTIVES: Y (Has Living Will and Wife is HCPOA) MOST FORM: Yes  PPS: 50%   PHYSICAL EXAM:   VITALS: Today's Vitals   04/12/18 1720  BP: (!) 120/58  Pulse: (!) 56  Resp: 18  SpO2: 95%  Weight: 158 lb 12.8 oz (72 kg)  PainSc: 0-No pain    LUNGS: clear to auscultation  CARDIAC: Cor Brady  EXTREMITIES: Trace edema to bilateral ankles up to calves SKIN: Exposed skin is intact, denies any skin issues  NEURO: alert and oriented x 3, pleasant and engaging       Daryl Eastern, RN

## 2018-04-12 NOTE — Progress Notes (Signed)
COMMUNITY PALLIATIVE CARE SW NOTE  PATIENT NAME: [name] [name] DOB: July 07, 1926 MRN: [id number]  PRIMARY CARE PROVIDER: Gayland Curry, DO  RESPONSIBLE PARTY:  Acct ID - Guarantor Home Phone Work Phone Relationship Acct Type  [id number] - [place],[phone]  Self P/F     [address], [address], [place], [place] [address]     PLAN OF CARE and INTERVENTIONS:             1. GOALS OF CARE/ ADVANCE CARE PLANNING:  Patient stated he would like to remain in his home.  He does not wish to be hospitalized. 2. SOCIAL/EMOTIONAL/SPIRITUAL ASSESSMENT/ INTERVENTIONS:  Patient is a 84 y/o caucasian male who resides with his wife at an apartment complex.  They have lived there for about 12 years.  Patient is a native of [place].  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  He was not in the [employer].  Patient has three children from a previous marriage.  They live in [place], [place] and [place].  His first wife died.  He met his current wife, [name] [name], at church.  They are both members of [employer] in [place].  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  She has two sons who are retired and live in [place].  Patient was able to engage in conversation.  He displayed the exercise chart he does, as well as, 2 pound weights that he uses.  Patient also said that he walks the halls of their apartment several times a day.  He denied pain.  Patient displayed a bright affect at times and was very welcoming of the team. 3. PATIENT/CAREGIVER EDUCATION/ COPING:  Patient openly expresses his thoughts and feelings.  SW and Palliative Care RN, [name] [name], provided education regarding the Palliative Care program.  They stated they understood and would like monthly visits. 4. PERSONAL EMERGENCY PLAN:  Patient will inform his wife if there is an issue.  She will call EMS form emergencies. 5. COMMUNITY RESOURCES COORDINATION/ HEALTH CARE NAVIGATION:  None per  patient's wife. 6. FINANCIAL/LEGAL CONCERNS/INTERVENTIONS:  None per patient's wife.     SOCIAL HX:  Social History   Tobacco Use  . Smoking status: Never Smoker  . Smokeless tobacco: Never Used  Substance Use Topics  . Alcohol use: No    CODE STATUS:  DNR  ADVANCED DIRECTIVES: HCPOA is patient's wife. MOST FORM COMPLETE:  Yes HOSPICE EDUCATION PROVIDED: Not provided during visit.  PPS:  Patient can stand on his own and ambulate independently.  He remains in her recliner most of the day.  His intake is normal. Duration of visit and documentation:  90 minutes. Next visit scheduled for:  1pm, May 17, 2018.      [name] Corn Daisean Brodhead, LCSW

## 2018-04-30 ENCOUNTER — Telehealth: Payer: Self-pay

## 2018-04-30 ENCOUNTER — Other Ambulatory Visit: Admitting: *Deleted

## 2018-04-30 ENCOUNTER — Encounter: Payer: Self-pay | Admitting: *Deleted

## 2018-04-30 ENCOUNTER — Other Ambulatory Visit: Admitting: Licensed Clinical Social Worker

## 2018-04-30 DIAGNOSIS — I25119 Atherosclerotic heart disease of native coronary artery with unspecified angina pectoris: Secondary | ICD-10-CM | POA: Diagnosis not present

## 2018-04-30 DIAGNOSIS — I10 Essential (primary) hypertension: Secondary | ICD-10-CM | POA: Diagnosis not present

## 2018-04-30 DIAGNOSIS — R634 Abnormal weight loss: Secondary | ICD-10-CM | POA: Diagnosis not present

## 2018-04-30 DIAGNOSIS — I5040 Unspecified combined systolic (congestive) and diastolic (congestive) heart failure: Secondary | ICD-10-CM | POA: Diagnosis not present

## 2018-04-30 DIAGNOSIS — E1159 Type 2 diabetes mellitus with other circulatory complications: Secondary | ICD-10-CM | POA: Diagnosis not present

## 2018-04-30 DIAGNOSIS — Z515 Encounter for palliative care: Secondary | ICD-10-CM

## 2018-04-30 DIAGNOSIS — H409 Unspecified glaucoma: Secondary | ICD-10-CM | POA: Diagnosis not present

## 2018-04-30 DIAGNOSIS — K219 Gastro-esophageal reflux disease without esophagitis: Secondary | ICD-10-CM | POA: Diagnosis not present

## 2018-04-30 DIAGNOSIS — E785 Hyperlipidemia, unspecified: Secondary | ICD-10-CM | POA: Diagnosis not present

## 2018-04-30 DIAGNOSIS — N401 Enlarged prostate with lower urinary tract symptoms: Secondary | ICD-10-CM | POA: Diagnosis not present

## 2018-04-30 DIAGNOSIS — N184 Chronic kidney disease, stage 4 (severe): Secondary | ICD-10-CM | POA: Diagnosis not present

## 2018-04-30 DIAGNOSIS — K222 Esophageal obstruction: Secondary | ICD-10-CM | POA: Diagnosis not present

## 2018-04-30 DIAGNOSIS — D649 Anemia, unspecified: Secondary | ICD-10-CM | POA: Diagnosis not present

## 2018-04-30 NOTE — Telephone Encounter (Signed)
Monisha RN updated and to make a visit ASAP.

## 2018-04-30 NOTE — Telephone Encounter (Signed)
I spoke with Stanton Kidney to give verbal order for hospice care. Mary verbalized understanding.

## 2018-04-30 NOTE — Telephone Encounter (Signed)
Per verbal from Dr. Mariea Clonts she will be the attending, she also gave verbal for patient to be admitted to hospice.  Not sure what a order set is??

## 2018-04-30 NOTE — Telephone Encounter (Signed)
[name] [name] with palliative care called to say that patient is having chest pain that has not been eased with 4 nitro today, progressive SOB, and unable to ambulate. Patient does not want to go to ED but he does want to be admitted to hospice.   Stanton Kidney would like to know if patient may be admitted to hospice and if so, you still be attending for patient.   Stanton Kidney would also like order sets for hospice if possible

## 2018-04-30 NOTE — Telephone Encounter (Signed)
Mrs. [name] [name] made aware that [facility] RN is on her way and should be there with in 15 minutes.

## 2018-04-30 NOTE — Progress Notes (Signed)
COMMUNITY PALLIATIVE CARE RN NOTE  PATIENT NAME: [name] [name] DOB: 11/03/1926 MRN: [id number]  PRIMARY CARE PROVIDER: Gayland Curry, DO  RESPONSIBLE PARTY:  Acct ID - Guarantor Home Phone Work Phone Relationship Acct Type  [id number] - [name] [name],[name] [name]  Self P/F     [address], [place], [place] [address]    PLAN OF CARE and INTERVENTION:  1. ADVANCE CARE PLANNING/GOALS OF CARE: Avoid hospitalizations, remain at home if possible 2. PATIENT/CAREGIVER EDUCATION: Reinforced Pain management and Safety Precautions 3. DISEASE STATUS: Joint visit made with [place]. Was contacted by Palliative Clinical [place] stating that wife called in to report patient is experiencing chest pain unrelieved with Nitroglycerin and Hydrocodone. Upon arrival, patient lying in bed awake. Having difficulty speaking d/t dyspnea and reports feeling "rotten." Abdominal breathing noted with rate of 22. Skin warm and clammy. Continued chest pain rated 9/10 radiating from [name] [name] side up to [name] [name] chest and across mid-sternal region. Patient is usually awake in the am by 8, but wife noticed that he was still in bed. Wife checked on patient around 9:30a and he was awake but too weak to get out of bed. Wife assisted him up to side of bed to use urinal. Patient has only had a few sips of Coke for breakfast and medications in applesauce today. Only medications patient had not received was Imdur and BP is elevated at 178/70. Had wife to administer Imdur and explained how it helps with chest pain. Patient experiences frequent episodes of chest pain over the past several months. Wife and patient want to avoid hospitalizations. Had conversation with patient and wife explaining hospice services and how both could benefit. Both are now agreeable to hospice. Contacted and spoke with Violeta Gelinas NP regarding patient condition. Orders given for Hydrocodone x 2 tabs now to see if this will help with  chest pain and advised will contact PCP Dr. Mariea Clonts to request orders for Hospice referral. Wife was able to get patient to ambulate to the bathroom in his bedroom with 1 person assistance during visit, however patient is still weak. Patient is usually able to ambulate independently in the home and provide personal care with stand by assistance. Appetite is typically good, however today patient does not want anything to eat at all. Medications must be crushed and given in applesauce d/t dysphagia. Patient was able to fall asleep towards end of visit.  - Shortly after visit received call from Violeta Gelinas NP to inform that she was able to obtain verbal orders for Hospice referral and gave information to Referral Center at [facility]. This RN contacted wife to make aware and advise that she will be contacted by the referral center to set up a time for AV nurse to come out today. Wife very appreciative of visit and interventions today.   HISTORY OF PRESENT ILLNESS:  This is a 84 yo male who resides at home with his wife who presents with chest pain unrelieved by Hydrocodone and Nitroglycerin x 4 tablets. Patient has a long history of chest pain along with several other cardiac issues. Visit made today to assess overall condition and assist with symptom management needs. Wife does not want patient to return to the hospital so has agreed to hospice services. Hospice AV nurse scheduled to visit patient today at 6p  CODE STATUS: DNR  ADVANCED DIRECTIVES: Y MOST FORM: yes PPS: 40%   PHYSICAL EXAM:   VITALS: Today's Vitals   04/30/18 1338  BP: (!) 178/70  Pulse: 66  Resp: (!) 22  SpO2: 95%  PainSc: 9   PainLoc: Chest    LUNGS: clear to ausculation, dyspnea noted with conversation and at rest CARDIAC: Cor RRR EXTREMITIES: No edema SKIN: Warm, clammy, diaphoretic  NEURO: Alert and oriented to person/place, minimally engaging, requiring assistance today with personal care and ambulation   (Duration of visit  and documentation 2 hours)    Daryl Eastern, RN, BSN

## 2018-04-30 NOTE — Progress Notes (Signed)
COMMUNITY PALLIATIVE CARE SW NOTE  PATIENT NAME: [name] [name] DOB: 01-Nov-1926 MRN: [id number]  PRIMARY CARE PROVIDER: Gayland Curry, DO  RESPONSIBLE PARTY:  Acct ID - Guarantor Home Phone Work Phone Relationship Acct Type  [id number] - [place],[phone]  Self P/F     [address], [address], [place], [place] [address]     PLAN OF CARE and INTERVENTIONS:             1. GOALS OF CARE/ ADVANCE CARE PLANNING:  For patient to remain in his home. 2. SOCIAL/EMOTIONAL/SPIRITUAL ASSESSMENT/ INTERVENTIONS:  SW and Palliative Care RN, Daryl Eastern, were asked to meet with patient per his wife's request.  Patient expressed having chest pain.  RN assessed and consulted NP, Violeta Gelinas.  SW provided supportive counseling and active listening to patient's wife.  She said she will contact patient's children and inform them of his status.   3. PATIENT/CAREGIVER EDUCATION/ COPING:  SW and RN provided education regarding Hospice services.  Patient and his wife agreed to referral.   4. PERSONAL EMERGENCY PLAN:  Patient informs his wife if he is in pain, and she administers medications. 5. COMMUNITY RESOURCES COORDINATION/ HEALTH CARE NAVIGATION:  None per wife. 6. FINANCIAL/LEGAL CONCERNS/INTERVENTIONS:  None per wife.     SOCIAL HX:  Social History   Tobacco Use  . Smoking status: Never Smoker  . Smokeless tobacco: Never Used  Substance Use Topics  . Alcohol use: No    CODE STATUS: DNR ADVANCED DIRECTIVES: Yes MOST FORM COMPLETE:   HOSPICE EDUCATION PROVIDED:  SW and RN provided extensive education regarding Hospice.  PPS:  During this visit, patient was only able to eat applesauce with is medication.  He required assistance from his wife to the bathroom. Duration of visit and documentation:  110 minutes.      Creola Corn Shawnise Peterkin, LCSW

## 2018-04-30 NOTE — Telephone Encounter (Signed)
Spoke with Dr. Konrad Dolores who shared that patient's wife called to report that patient has had chest pain today. Patient/wife goals is to avoid hospitalizations and to pursue comfort care. Plan is to contact [facility] RN to make home visit ASAP.

## 2018-05-01 DIAGNOSIS — R634 Abnormal weight loss: Secondary | ICD-10-CM | POA: Diagnosis not present

## 2018-05-01 DIAGNOSIS — E1159 Type 2 diabetes mellitus with other circulatory complications: Secondary | ICD-10-CM | POA: Diagnosis not present

## 2018-05-01 DIAGNOSIS — I10 Essential (primary) hypertension: Secondary | ICD-10-CM | POA: Diagnosis not present

## 2018-05-01 DIAGNOSIS — E785 Hyperlipidemia, unspecified: Secondary | ICD-10-CM | POA: Diagnosis not present

## 2018-05-01 DIAGNOSIS — I5040 Unspecified combined systolic (congestive) and diastolic (congestive) heart failure: Secondary | ICD-10-CM | POA: Diagnosis not present

## 2018-05-01 DIAGNOSIS — I25119 Atherosclerotic heart disease of native coronary artery with unspecified angina pectoris: Secondary | ICD-10-CM | POA: Diagnosis not present

## 2018-05-02 ENCOUNTER — Other Ambulatory Visit: Payer: Medicare Other

## 2018-05-03 DIAGNOSIS — E1159 Type 2 diabetes mellitus with other circulatory complications: Secondary | ICD-10-CM | POA: Diagnosis not present

## 2018-05-03 DIAGNOSIS — E785 Hyperlipidemia, unspecified: Secondary | ICD-10-CM | POA: Diagnosis not present

## 2018-05-03 DIAGNOSIS — I10 Essential (primary) hypertension: Secondary | ICD-10-CM | POA: Diagnosis not present

## 2018-05-03 DIAGNOSIS — I5040 Unspecified combined systolic (congestive) and diastolic (congestive) heart failure: Secondary | ICD-10-CM | POA: Diagnosis not present

## 2018-05-03 DIAGNOSIS — I25119 Atherosclerotic heart disease of native coronary artery with unspecified angina pectoris: Secondary | ICD-10-CM | POA: Diagnosis not present

## 2018-05-03 DIAGNOSIS — R634 Abnormal weight loss: Secondary | ICD-10-CM | POA: Diagnosis not present

## 2018-05-07 ENCOUNTER — Telehealth: Payer: Self-pay | Admitting: *Deleted

## 2018-05-07 DIAGNOSIS — I10 Essential (primary) hypertension: Secondary | ICD-10-CM | POA: Diagnosis not present

## 2018-05-07 DIAGNOSIS — R634 Abnormal weight loss: Secondary | ICD-10-CM | POA: Diagnosis not present

## 2018-05-07 DIAGNOSIS — E1159 Type 2 diabetes mellitus with other circulatory complications: Secondary | ICD-10-CM | POA: Diagnosis not present

## 2018-05-07 DIAGNOSIS — E785 Hyperlipidemia, unspecified: Secondary | ICD-10-CM | POA: Diagnosis not present

## 2018-05-07 DIAGNOSIS — I5040 Unspecified combined systolic (congestive) and diastolic (congestive) heart failure: Secondary | ICD-10-CM | POA: Diagnosis not present

## 2018-05-07 DIAGNOSIS — I25119 Atherosclerotic heart disease of native coronary artery with unspecified angina pectoris: Secondary | ICD-10-CM | POA: Diagnosis not present

## 2018-05-07 NOTE — Telephone Encounter (Signed)
Patient daughter, Corky Sox called and requested to speak with you regarding patient's level of care Increasing. Seeing Palliative Care. Daughter is thinking patient is now aspirating several times a day. Daughter is wanting to know if they should keep appointment for Thursday. Please Advise.

## 2018-05-08 NOTE — Telephone Encounter (Signed)
If he's able to come in, I would like to see them tomorrow.

## 2018-05-08 NOTE — Telephone Encounter (Signed)
Appointment for tomorrow was canceled with the indication patient is now under Hospice Care and daughter did not think he needed to be seen.  Your schedule is now full  Patient's daughter left another message stating that it is urgent that Dr.[name] call her, she will be going to see her father tomorrow and needs to speak to Dr.[name] prior

## 2018-05-08 NOTE — Telephone Encounter (Signed)
I spoke with patient's daughter to give her a status update on message, [name] [name] stated that she did not cancel the appointment [name] [name] did.   [name] [name] would like to speak to Dr.[name]. [name] [name] is concerned that her father is going to end up with pneumonia. Patient with choking episodes, aspirating several times a day, and and [name] [name] is questioning liquid thickening

## 2018-05-08 NOTE — Telephone Encounter (Signed)
He had a bad week last week with pain in his chest and abdomen.  [name] [name] went to see him Saturday.  [name] [name] gave him his pain medicine and called the nurse on call who suggested a second dose.  After 2 hrs, the pain subsided.  That is better now.  [name] [name] and her sister are concerned that [name] [name] is not thickening his liquids properly so he's aspirating over and over again. He choked on regular liquids [name] [name] was giving him through a straw.  He did better Sunday and Monday.  [name] [name] planned to come along to the appt.  [name] [name] canceled the lab appt and then his appt for tomorrow.  [name] [name] would have helped, but she said he didn't have to go.  I will relay a message to home hospice about his aspiration so they are aware and can assist [name] [name] in helping him with his aspiration.

## 2018-05-09 ENCOUNTER — Telehealth: Payer: Self-pay | Admitting: *Deleted

## 2018-05-09 ENCOUNTER — Ambulatory Visit: Payer: Medicare Other | Admitting: Internal Medicine

## 2018-05-09 NOTE — Telephone Encounter (Signed)
[name] [name] with Hospice called and stated that he would like for you to call him. Stated that he would like your impression of the patient so he can give better patient care.  Also wants Clarification on patient's Lasix, Perimeters for patient's weight gain/loss.  So far patient's weight has been stable.  159 initial weight, 5/24-160  5/28-159  [name] is requesting to speak with you directly and would like for you to call him at [phone]

## 2018-05-09 NOTE — Telephone Encounter (Signed)
Spoke with [name] [name].  Gave parameters for lasix to be given for weight gain of 3 lbs in 24 hrs of 5 lbs in 1 week.  Also, reviewed his esophageal spasms and aspiration.  Pt's swallow study had shown he should be on dysphagia 2 with nectar thickened liquids.  Discussed how pt's wife had not been following this.  Also on crushed meds.  Mentioned that pt's daughter Corky Sox was the one informing me of these concerns.

## 2018-05-10 DIAGNOSIS — I10 Essential (primary) hypertension: Secondary | ICD-10-CM | POA: Diagnosis not present

## 2018-05-10 DIAGNOSIS — R634 Abnormal weight loss: Secondary | ICD-10-CM | POA: Diagnosis not present

## 2018-05-10 DIAGNOSIS — E785 Hyperlipidemia, unspecified: Secondary | ICD-10-CM | POA: Diagnosis not present

## 2018-05-10 DIAGNOSIS — E1159 Type 2 diabetes mellitus with other circulatory complications: Secondary | ICD-10-CM | POA: Diagnosis not present

## 2018-05-10 DIAGNOSIS — I25119 Atherosclerotic heart disease of native coronary artery with unspecified angina pectoris: Secondary | ICD-10-CM | POA: Diagnosis not present

## 2018-05-10 DIAGNOSIS — I5040 Unspecified combined systolic (congestive) and diastolic (congestive) heart failure: Secondary | ICD-10-CM | POA: Diagnosis not present

## 2018-05-11 DIAGNOSIS — R634 Abnormal weight loss: Secondary | ICD-10-CM | POA: Diagnosis not present

## 2018-05-11 DIAGNOSIS — I25119 Atherosclerotic heart disease of native coronary artery with unspecified angina pectoris: Secondary | ICD-10-CM | POA: Diagnosis not present

## 2018-05-11 DIAGNOSIS — H409 Unspecified glaucoma: Secondary | ICD-10-CM | POA: Diagnosis not present

## 2018-05-11 DIAGNOSIS — E785 Hyperlipidemia, unspecified: Secondary | ICD-10-CM | POA: Diagnosis not present

## 2018-05-11 DIAGNOSIS — E1159 Type 2 diabetes mellitus with other circulatory complications: Secondary | ICD-10-CM | POA: Diagnosis not present

## 2018-05-11 DIAGNOSIS — N401 Enlarged prostate with lower urinary tract symptoms: Secondary | ICD-10-CM | POA: Diagnosis not present

## 2018-05-11 DIAGNOSIS — I5040 Unspecified combined systolic (congestive) and diastolic (congestive) heart failure: Secondary | ICD-10-CM | POA: Diagnosis not present

## 2018-05-11 DIAGNOSIS — K222 Esophageal obstruction: Secondary | ICD-10-CM | POA: Diagnosis not present

## 2018-05-11 DIAGNOSIS — I10 Essential (primary) hypertension: Secondary | ICD-10-CM | POA: Diagnosis not present

## 2018-05-11 DIAGNOSIS — N184 Chronic kidney disease, stage 4 (severe): Secondary | ICD-10-CM | POA: Diagnosis not present

## 2018-05-11 DIAGNOSIS — K219 Gastro-esophageal reflux disease without esophagitis: Secondary | ICD-10-CM | POA: Diagnosis not present

## 2018-05-11 DIAGNOSIS — D649 Anemia, unspecified: Secondary | ICD-10-CM | POA: Diagnosis not present

## 2018-05-13 ENCOUNTER — Telehealth: Payer: Self-pay | Admitting: *Deleted

## 2018-05-13 ENCOUNTER — Other Ambulatory Visit: Payer: Self-pay | Admitting: Internal Medicine

## 2018-05-13 DIAGNOSIS — I25119 Atherosclerotic heart disease of native coronary artery with unspecified angina pectoris: Secondary | ICD-10-CM | POA: Diagnosis not present

## 2018-05-13 DIAGNOSIS — I10 Essential (primary) hypertension: Secondary | ICD-10-CM | POA: Diagnosis not present

## 2018-05-13 DIAGNOSIS — E1159 Type 2 diabetes mellitus with other circulatory complications: Secondary | ICD-10-CM | POA: Diagnosis not present

## 2018-05-13 DIAGNOSIS — E785 Hyperlipidemia, unspecified: Secondary | ICD-10-CM | POA: Diagnosis not present

## 2018-05-13 DIAGNOSIS — I5040 Unspecified combined systolic (congestive) and diastolic (congestive) heart failure: Secondary | ICD-10-CM | POA: Diagnosis not present

## 2018-05-13 DIAGNOSIS — R0789 Other chest pain: Secondary | ICD-10-CM

## 2018-05-13 DIAGNOSIS — R634 Abnormal weight loss: Secondary | ICD-10-CM | POA: Diagnosis not present

## 2018-05-13 MED ORDER — MORPHINE SULFATE (CONCENTRATE) 20 MG/ML PO SOLN: 5.0000 mg | Freq: Four times a day (QID) | ORAL | 0 refills | Status: AC | PRN

## 2018-05-13 NOTE — Telephone Encounter (Signed)
Spoke with [name] [name].  I am sending in some morphine 20mg /ml 5mg  (0.41ml) po q 6 hr prn severe pain.  We may need to adjust frequency if he receives this and still does not get relief of his pain.

## 2018-05-13 NOTE — Telephone Encounter (Signed)
[name] [name] with Hospice called and stated that he is in patient's home. Patient is complaining of severe pain from Mid Sternum of chest all the way down left side. Patient is lying in bed. Pain 10/10. Took Nitroglycerin at 8:30 and 9:50 with no relief. At 9:30 took 500mg  Tylenol and a Hydrocodone with no relief. [name] is thinking a more fast acting pain medication.   Vitals: SpO2: 96%, Pulse 58, RR 32, BP 148/78 with Auscultatory gap 130-100.   Please Advise. Would like a response ASAP due to in home with patient.

## 2018-05-15 ENCOUNTER — Telehealth: Payer: Self-pay | Admitting: *Deleted

## 2018-05-15 NOTE — Telephone Encounter (Signed)
Patient daughter, Corky Sox called and stated that she would like to speak with Dr. Mariea Clonts directly. Stated that she spoke with a Biochemist, clinical, which is a friend, and he stated that maybe patient need to be evaluated for Mesenteric Ischemia.  Daughter stated that patient's episodes of pain are calmed down by liquid Morphine and stated that on days patient does not have the pain he does really well.  Daughter requested to speak with you and would like for you to call [phone]

## 2018-05-16 DIAGNOSIS — E785 Hyperlipidemia, unspecified: Secondary | ICD-10-CM | POA: Diagnosis not present

## 2018-05-16 DIAGNOSIS — I5040 Unspecified combined systolic (congestive) and diastolic (congestive) heart failure: Secondary | ICD-10-CM | POA: Diagnosis not present

## 2018-05-16 DIAGNOSIS — E1159 Type 2 diabetes mellitus with other circulatory complications: Secondary | ICD-10-CM | POA: Diagnosis not present

## 2018-05-16 DIAGNOSIS — I10 Essential (primary) hypertension: Secondary | ICD-10-CM | POA: Diagnosis not present

## 2018-05-16 DIAGNOSIS — R634 Abnormal weight loss: Secondary | ICD-10-CM | POA: Diagnosis not present

## 2018-05-16 DIAGNOSIS — I25119 Atherosclerotic heart disease of native coronary artery with unspecified angina pectoris: Secondary | ICD-10-CM | POA: Diagnosis not present

## 2018-05-16 NOTE — Telephone Encounter (Signed)
Spoke with [employer].  We discussed the invasive workup, transportation needs, and patient's weakness making investigation too challenging not to mention that treatment with anticoagulation would not be in his best interests with his progressive anemia.  He'd been taken off plavix.  I suggested we continue the morphine which did give him pain relief within a couple of hours.  We talked about the cause of the pain and that the suspicion is that it is due to esophageal spasms or even impactions with his dysphagia progressing.  We discussed that we can adjust his morphine if the episodes become more frequent or if they do not resolve with the 5mg  dosage.  I also suggested they communicate through [name] [name] from hospice since he is laying eyes on [name] [name] a couple times per week.  [name] [name] also mentioned that [name] [name] is doing better about thickening the liquids to prevent aspiration spells since she was provided more education in the home.  8 minutes were spent on this call.

## 2018-05-17 ENCOUNTER — Other Ambulatory Visit: Admitting: Licensed Clinical Social Worker

## 2018-05-17 DIAGNOSIS — I25119 Atherosclerotic heart disease of native coronary artery with unspecified angina pectoris: Secondary | ICD-10-CM | POA: Diagnosis not present

## 2018-05-17 DIAGNOSIS — E1159 Type 2 diabetes mellitus with other circulatory complications: Secondary | ICD-10-CM | POA: Diagnosis not present

## 2018-05-17 DIAGNOSIS — E785 Hyperlipidemia, unspecified: Secondary | ICD-10-CM | POA: Diagnosis not present

## 2018-05-17 DIAGNOSIS — R634 Abnormal weight loss: Secondary | ICD-10-CM | POA: Diagnosis not present

## 2018-05-17 DIAGNOSIS — I10 Essential (primary) hypertension: Secondary | ICD-10-CM | POA: Diagnosis not present

## 2018-05-17 DIAGNOSIS — I5040 Unspecified combined systolic (congestive) and diastolic (congestive) heart failure: Secondary | ICD-10-CM | POA: Diagnosis not present

## 2018-05-18 DIAGNOSIS — R634 Abnormal weight loss: Secondary | ICD-10-CM | POA: Diagnosis not present

## 2018-05-18 DIAGNOSIS — E785 Hyperlipidemia, unspecified: Secondary | ICD-10-CM | POA: Diagnosis not present

## 2018-05-18 DIAGNOSIS — I10 Essential (primary) hypertension: Secondary | ICD-10-CM | POA: Diagnosis not present

## 2018-05-18 DIAGNOSIS — I25119 Atherosclerotic heart disease of native coronary artery with unspecified angina pectoris: Secondary | ICD-10-CM | POA: Diagnosis not present

## 2018-05-18 DIAGNOSIS — E1159 Type 2 diabetes mellitus with other circulatory complications: Secondary | ICD-10-CM | POA: Diagnosis not present

## 2018-05-18 DIAGNOSIS — I5040 Unspecified combined systolic (congestive) and diastolic (congestive) heart failure: Secondary | ICD-10-CM | POA: Diagnosis not present

## 2018-05-19 DIAGNOSIS — I5040 Unspecified combined systolic (congestive) and diastolic (congestive) heart failure: Secondary | ICD-10-CM | POA: Diagnosis not present

## 2018-05-19 DIAGNOSIS — E1159 Type 2 diabetes mellitus with other circulatory complications: Secondary | ICD-10-CM | POA: Diagnosis not present

## 2018-05-19 DIAGNOSIS — I10 Essential (primary) hypertension: Secondary | ICD-10-CM | POA: Diagnosis not present

## 2018-05-19 DIAGNOSIS — I25119 Atherosclerotic heart disease of native coronary artery with unspecified angina pectoris: Secondary | ICD-10-CM | POA: Diagnosis not present

## 2018-05-19 DIAGNOSIS — R634 Abnormal weight loss: Secondary | ICD-10-CM | POA: Diagnosis not present

## 2018-05-19 DIAGNOSIS — E785 Hyperlipidemia, unspecified: Secondary | ICD-10-CM | POA: Diagnosis not present

## 2018-05-20 ENCOUNTER — Other Ambulatory Visit: Payer: Self-pay | Admitting: Internal Medicine

## 2018-05-20 ENCOUNTER — Telehealth: Payer: Self-pay

## 2018-05-20 DIAGNOSIS — I5040 Unspecified combined systolic (congestive) and diastolic (congestive) heart failure: Secondary | ICD-10-CM | POA: Diagnosis not present

## 2018-05-20 DIAGNOSIS — E1159 Type 2 diabetes mellitus with other circulatory complications: Secondary | ICD-10-CM | POA: Diagnosis not present

## 2018-05-20 DIAGNOSIS — R634 Abnormal weight loss: Secondary | ICD-10-CM | POA: Diagnosis not present

## 2018-05-20 DIAGNOSIS — I25119 Atherosclerotic heart disease of native coronary artery with unspecified angina pectoris: Secondary | ICD-10-CM | POA: Diagnosis not present

## 2018-05-20 DIAGNOSIS — E785 Hyperlipidemia, unspecified: Secondary | ICD-10-CM | POA: Diagnosis not present

## 2018-05-20 DIAGNOSIS — J690 Pneumonitis due to inhalation of food and vomit: Secondary | ICD-10-CM

## 2018-05-20 DIAGNOSIS — I10 Essential (primary) hypertension: Secondary | ICD-10-CM | POA: Diagnosis not present

## 2018-05-20 MED ORDER — AMOXICILLIN-POT CLAVULANATE 125-31.25 MG/5ML PO SUSR: 5.0000 mL | Freq: Three times a day (TID) | ORAL | 0 refills | Status: DC

## 2018-05-20 MED ORDER — AMOXICILLIN-POT CLAVULANATE 250-62.5 MG/5ML PO SUSR: 250.0000 mg | Freq: Three times a day (TID) | ORAL | 0 refills | Status: DC

## 2018-05-20 NOTE — Telephone Encounter (Signed)
New Rx was sent with changed dosage.

## 2018-05-20 NOTE — Telephone Encounter (Signed)
[name] with Hospice called, patient with nausea and vomiting (last episode Friday) productive cough (rusty red phlegm), crackles in left base, oxygen 89% (increased to 96 % with oxygen), Respirations 20, Pulse 68, low grade fever 99.3 (was 101.0 over the weekend) and B/P 148/74  Please advise

## 2018-05-20 NOTE — Telephone Encounter (Signed)
Message from [facility], the Augmentin 125/31.25 dose is too expensive and he's asking for 250/5mg  dose and hospice will pay for this. Please advise

## 2018-05-20 NOTE — Telephone Encounter (Signed)
Spoke with nurse and advised results rx sent to pharmacy by e-script

## 2018-05-21 DIAGNOSIS — I5040 Unspecified combined systolic (congestive) and diastolic (congestive) heart failure: Secondary | ICD-10-CM | POA: Diagnosis not present

## 2018-05-21 DIAGNOSIS — E785 Hyperlipidemia, unspecified: Secondary | ICD-10-CM | POA: Diagnosis not present

## 2018-05-21 DIAGNOSIS — I25119 Atherosclerotic heart disease of native coronary artery with unspecified angina pectoris: Secondary | ICD-10-CM | POA: Diagnosis not present

## 2018-05-21 DIAGNOSIS — I10 Essential (primary) hypertension: Secondary | ICD-10-CM | POA: Diagnosis not present

## 2018-05-21 DIAGNOSIS — R634 Abnormal weight loss: Secondary | ICD-10-CM | POA: Diagnosis not present

## 2018-05-21 DIAGNOSIS — E1159 Type 2 diabetes mellitus with other circulatory complications: Secondary | ICD-10-CM | POA: Diagnosis not present

## 2018-05-23 DIAGNOSIS — E1159 Type 2 diabetes mellitus with other circulatory complications: Secondary | ICD-10-CM | POA: Diagnosis not present

## 2018-05-23 DIAGNOSIS — I25119 Atherosclerotic heart disease of native coronary artery with unspecified angina pectoris: Secondary | ICD-10-CM | POA: Diagnosis not present

## 2018-05-23 DIAGNOSIS — R634 Abnormal weight loss: Secondary | ICD-10-CM | POA: Diagnosis not present

## 2018-05-23 DIAGNOSIS — I10 Essential (primary) hypertension: Secondary | ICD-10-CM | POA: Diagnosis not present

## 2018-05-23 DIAGNOSIS — I5040 Unspecified combined systolic (congestive) and diastolic (congestive) heart failure: Secondary | ICD-10-CM | POA: Diagnosis not present

## 2018-05-23 DIAGNOSIS — E785 Hyperlipidemia, unspecified: Secondary | ICD-10-CM | POA: Diagnosis not present

## 2018-05-27 DIAGNOSIS — E1159 Type 2 diabetes mellitus with other circulatory complications: Secondary | ICD-10-CM | POA: Diagnosis not present

## 2018-05-27 DIAGNOSIS — E785 Hyperlipidemia, unspecified: Secondary | ICD-10-CM | POA: Diagnosis not present

## 2018-05-27 DIAGNOSIS — I5040 Unspecified combined systolic (congestive) and diastolic (congestive) heart failure: Secondary | ICD-10-CM | POA: Diagnosis not present

## 2018-05-27 DIAGNOSIS — R634 Abnormal weight loss: Secondary | ICD-10-CM | POA: Diagnosis not present

## 2018-05-27 DIAGNOSIS — I10 Essential (primary) hypertension: Secondary | ICD-10-CM | POA: Diagnosis not present

## 2018-05-27 DIAGNOSIS — I25119 Atherosclerotic heart disease of native coronary artery with unspecified angina pectoris: Secondary | ICD-10-CM | POA: Diagnosis not present

## 2018-05-29 DIAGNOSIS — I5040 Unspecified combined systolic (congestive) and diastolic (congestive) heart failure: Secondary | ICD-10-CM | POA: Diagnosis not present

## 2018-05-29 DIAGNOSIS — R634 Abnormal weight loss: Secondary | ICD-10-CM | POA: Diagnosis not present

## 2018-05-29 DIAGNOSIS — E785 Hyperlipidemia, unspecified: Secondary | ICD-10-CM | POA: Diagnosis not present

## 2018-05-29 DIAGNOSIS — I25119 Atherosclerotic heart disease of native coronary artery with unspecified angina pectoris: Secondary | ICD-10-CM | POA: Diagnosis not present

## 2018-05-29 DIAGNOSIS — E1159 Type 2 diabetes mellitus with other circulatory complications: Secondary | ICD-10-CM | POA: Diagnosis not present

## 2018-05-29 DIAGNOSIS — I10 Essential (primary) hypertension: Secondary | ICD-10-CM | POA: Diagnosis not present

## 2018-05-30 ENCOUNTER — Telehealth: Payer: Self-pay

## 2018-05-30 DIAGNOSIS — E785 Hyperlipidemia, unspecified: Secondary | ICD-10-CM | POA: Diagnosis not present

## 2018-05-30 DIAGNOSIS — I5040 Unspecified combined systolic (congestive) and diastolic (congestive) heart failure: Secondary | ICD-10-CM | POA: Diagnosis not present

## 2018-05-30 DIAGNOSIS — I25119 Atherosclerotic heart disease of native coronary artery with unspecified angina pectoris: Secondary | ICD-10-CM | POA: Diagnosis not present

## 2018-05-30 DIAGNOSIS — E1159 Type 2 diabetes mellitus with other circulatory complications: Secondary | ICD-10-CM | POA: Diagnosis not present

## 2018-05-30 DIAGNOSIS — R634 Abnormal weight loss: Secondary | ICD-10-CM | POA: Diagnosis not present

## 2018-05-30 DIAGNOSIS — I10 Essential (primary) hypertension: Secondary | ICD-10-CM | POA: Diagnosis not present

## 2018-05-30 NOTE — Telephone Encounter (Signed)
A letter was received today from [facility] stating the following:   They were supposed to fly out of [place] for [place] on June 16 but they were unable to go due to patient's declining health. In order to be re-emersed for their tickets, they must have a letter from patient's physician stating that due to patient's declining health, they were unable to make their flight. The family will need 2 copies of the letter.   Please call [phone] when letters are ready to be picked up.   Incoming letter was placed in Dr. Cyndi Lennert review and sign folder.

## 2018-05-30 NOTE — Telephone Encounter (Signed)
[name] [name] with hospice palliative care called with an update on the patient. He has finished his antibiotics but is lethargic. BP: 11/54, R 28, P 55, O2: 94% on 2 liters of oxygen nasal canula. Temp. 97.9. Lungs are diminished in bases but did not hear any crackling. Patient does have a productive cough with brown sputum. Family says he ate good yesterday and this morning he had yogurt and ovaltine but has not wanted lunch so far. On assessment he says he hurts everywhere and rated it a 10/10 although wife says he has not mentioned any pain to her. She gave him tylenol. Patient is having some nausea. Routing to Dr. Mariea Clonts.

## 2018-05-30 NOTE — Telephone Encounter (Signed)
Letter completed.

## 2018-05-30 NOTE — Telephone Encounter (Signed)
Called and discussed with [name] [facility]). He states the patient has compazine for his nausea and he will get that started although the patient has only complained of nausea once. The wife also has on hand the hydrocodone but will be calling [name] if he continues to be in pain. He will advise her the morphine could be used for pain. Routing to Dr. Mariea Clonts for [place].

## 2018-05-30 NOTE — Telephone Encounter (Signed)
Discussed with [facility], letters are ready for pick up.

## 2018-05-30 NOTE — Telephone Encounter (Signed)
Please call [name] [name] back:  Would recommend adding zofran 4mg  q 6 hrs prn nausea--can be sent to his pharmacy.  Would also recommend using his morphine if he was hurting all over.

## 2018-05-31 DIAGNOSIS — I10 Essential (primary) hypertension: Secondary | ICD-10-CM | POA: Diagnosis not present

## 2018-05-31 DIAGNOSIS — E785 Hyperlipidemia, unspecified: Secondary | ICD-10-CM | POA: Diagnosis not present

## 2018-05-31 DIAGNOSIS — I5040 Unspecified combined systolic (congestive) and diastolic (congestive) heart failure: Secondary | ICD-10-CM | POA: Diagnosis not present

## 2018-05-31 DIAGNOSIS — R634 Abnormal weight loss: Secondary | ICD-10-CM | POA: Diagnosis not present

## 2018-05-31 DIAGNOSIS — I25119 Atherosclerotic heart disease of native coronary artery with unspecified angina pectoris: Secondary | ICD-10-CM | POA: Diagnosis not present

## 2018-05-31 DIAGNOSIS — E1159 Type 2 diabetes mellitus with other circulatory complications: Secondary | ICD-10-CM | POA: Diagnosis not present

## 2018-06-01 ENCOUNTER — Other Ambulatory Visit: Payer: Self-pay | Admitting: Internal Medicine

## 2018-06-01 DIAGNOSIS — I10 Essential (primary) hypertension: Secondary | ICD-10-CM | POA: Diagnosis not present

## 2018-06-01 DIAGNOSIS — R634 Abnormal weight loss: Secondary | ICD-10-CM | POA: Diagnosis not present

## 2018-06-01 DIAGNOSIS — E785 Hyperlipidemia, unspecified: Secondary | ICD-10-CM | POA: Diagnosis not present

## 2018-06-01 DIAGNOSIS — I5040 Unspecified combined systolic (congestive) and diastolic (congestive) heart failure: Secondary | ICD-10-CM | POA: Diagnosis not present

## 2018-06-01 DIAGNOSIS — I25119 Atherosclerotic heart disease of native coronary artery with unspecified angina pectoris: Secondary | ICD-10-CM | POA: Diagnosis not present

## 2018-06-01 DIAGNOSIS — E1159 Type 2 diabetes mellitus with other circulatory complications: Secondary | ICD-10-CM | POA: Diagnosis not present

## 2018-06-03 DIAGNOSIS — E1159 Type 2 diabetes mellitus with other circulatory complications: Secondary | ICD-10-CM | POA: Diagnosis not present

## 2018-06-03 DIAGNOSIS — I10 Essential (primary) hypertension: Secondary | ICD-10-CM | POA: Diagnosis not present

## 2018-06-03 DIAGNOSIS — E785 Hyperlipidemia, unspecified: Secondary | ICD-10-CM | POA: Diagnosis not present

## 2018-06-03 DIAGNOSIS — I25119 Atherosclerotic heart disease of native coronary artery with unspecified angina pectoris: Secondary | ICD-10-CM | POA: Diagnosis not present

## 2018-06-03 DIAGNOSIS — R634 Abnormal weight loss: Secondary | ICD-10-CM | POA: Diagnosis not present

## 2018-06-03 DIAGNOSIS — I5040 Unspecified combined systolic (congestive) and diastolic (congestive) heart failure: Secondary | ICD-10-CM | POA: Diagnosis not present

## 2018-06-04 ENCOUNTER — Telehealth: Payer: Self-pay

## 2018-06-04 NOTE — Telephone Encounter (Signed)
Patients daughter called and left voicemail stating the patient is a patient of Dr. Cyndi Lennert and is at home under hospice care and has several questions about different pains the patient is having and would like a call back from Dr. Mariea Clonts today if possible.   Called Patient's daughter who states the hospice doctor has not been involved as far has she knows.  She says he has had the pain in abdomen for a long time now. It seems he is still having the same pain. Pain has been a 10/10. Other than grimacing and saying he is in pain he doesn't give much more details.   Yesterday he had an episode and they questioned him, he eventually said he doesn't hurt other than the strange sensation of his stomach pumping/pulsing. They could actually see it happening and was able to feel it below his sternum. Last night she goggled diaphragm spasms and found diaphramatic flutter and the description seem to match his symptoms.    [name] who is his regular hospice nurse is out of town and the new nurse is in charge. He just says he feels bad all over and wonder if it could be part of the kidney failure? They've giving him 700 mg of tylenol and says in about an hour later he falls asleep, so she thinks it is helping. His wife is afraid to give him them morphine and hydrocodone because last time he crawled out of bed onto the floor.   Her main concerns are there any recommendations for his achiness and pain all over. Is 1/2 or 1/4 hydrocodone an option? Could an anti anxiety med help but not sedate him?   Routed to Dr. Mariea Clonts

## 2018-06-04 NOTE — Telephone Encounter (Signed)
Discussed with Cherlyn Roberts. She will get with the nurse for Ativan and try the hydrocodone if needed. She just left him and he was fine.

## 2018-06-04 NOTE — Telephone Encounter (Signed)
I do think it's reasonable to try 1/2 of a hydrocodone for the pain if a full one caused him to crawl out of the bed.  Ativan for anxiety may also help.  Please notify Corky Sox about these suggestions.  The hospice nurse filling in for [name] should also be able to help her since she's directly seen Mr. Hy.

## 2018-06-06 DIAGNOSIS — E1159 Type 2 diabetes mellitus with other circulatory complications: Secondary | ICD-10-CM | POA: Diagnosis not present

## 2018-06-06 DIAGNOSIS — I25119 Atherosclerotic heart disease of native coronary artery with unspecified angina pectoris: Secondary | ICD-10-CM | POA: Diagnosis not present

## 2018-06-06 DIAGNOSIS — E785 Hyperlipidemia, unspecified: Secondary | ICD-10-CM | POA: Diagnosis not present

## 2018-06-06 DIAGNOSIS — I10 Essential (primary) hypertension: Secondary | ICD-10-CM | POA: Diagnosis not present

## 2018-06-06 DIAGNOSIS — R634 Abnormal weight loss: Secondary | ICD-10-CM | POA: Diagnosis not present

## 2018-06-06 DIAGNOSIS — I5040 Unspecified combined systolic (congestive) and diastolic (congestive) heart failure: Secondary | ICD-10-CM | POA: Diagnosis not present

## 2018-06-10 ENCOUNTER — Telehealth: Payer: Self-pay

## 2018-06-10 ENCOUNTER — Other Ambulatory Visit: Payer: Self-pay | Admitting: Internal Medicine

## 2018-06-10 DIAGNOSIS — K222 Esophageal obstruction: Secondary | ICD-10-CM | POA: Diagnosis not present

## 2018-06-10 DIAGNOSIS — H409 Unspecified glaucoma: Secondary | ICD-10-CM | POA: Diagnosis not present

## 2018-06-10 DIAGNOSIS — F419 Anxiety disorder, unspecified: Secondary | ICD-10-CM

## 2018-06-10 DIAGNOSIS — I25119 Atherosclerotic heart disease of native coronary artery with unspecified angina pectoris: Secondary | ICD-10-CM | POA: Diagnosis not present

## 2018-06-10 DIAGNOSIS — K224 Dyskinesia of esophagus: Secondary | ICD-10-CM

## 2018-06-10 DIAGNOSIS — E1159 Type 2 diabetes mellitus with other circulatory complications: Secondary | ICD-10-CM | POA: Diagnosis not present

## 2018-06-10 DIAGNOSIS — N184 Chronic kidney disease, stage 4 (severe): Secondary | ICD-10-CM | POA: Diagnosis not present

## 2018-06-10 DIAGNOSIS — K219 Gastro-esophageal reflux disease without esophagitis: Secondary | ICD-10-CM | POA: Diagnosis not present

## 2018-06-10 DIAGNOSIS — D649 Anemia, unspecified: Secondary | ICD-10-CM | POA: Diagnosis not present

## 2018-06-10 DIAGNOSIS — E785 Hyperlipidemia, unspecified: Secondary | ICD-10-CM | POA: Diagnosis not present

## 2018-06-10 DIAGNOSIS — N401 Enlarged prostate with lower urinary tract symptoms: Secondary | ICD-10-CM | POA: Diagnosis not present

## 2018-06-10 DIAGNOSIS — R634 Abnormal weight loss: Secondary | ICD-10-CM | POA: Diagnosis not present

## 2018-06-10 DIAGNOSIS — I5040 Unspecified combined systolic (congestive) and diastolic (congestive) heart failure: Secondary | ICD-10-CM | POA: Diagnosis not present

## 2018-06-10 DIAGNOSIS — I10 Essential (primary) hypertension: Secondary | ICD-10-CM | POA: Diagnosis not present

## 2018-06-10 MED ORDER — LORAZEPAM 0.5 MG PO TABS: 0.5000 mg | ORAL_TABLET | Freq: Four times a day (QID) | ORAL | 1 refills | Status: DC | PRN

## 2018-06-10 MED ORDER — HYDROCODONE-ACETAMINOPHEN 5-325 MG PO TABS: 1.0000 | ORAL_TABLET | Freq: Four times a day (QID) | ORAL | 0 refills | Status: AC | PRN

## 2018-06-10 NOTE — Telephone Encounter (Signed)
[name] [name] with Hospice called requesting to speak with Dr.[name]. I offered to take a message and [name] [name] insisted that he speak with Dr.[name] and held x 2. Dr.[name] was currently seeing patients and unavailable.  I informed [name] [name] that Dr.[name] will return call when available, [name] [name] expressed his gratitude for forwarding message and will await return call.

## 2018-06-10 NOTE — Telephone Encounter (Signed)
Vitals all good.  Appetite poor like applesauce, magic cups only all day.  Needs hydrocodone refill to [facility] at [address] and [address].  PO intake had been poor and he looked dry last week.  His daughter thought he had diaphragmatic flutter and we had said we thought it was esophageal spasticity.  Start ativan 0.5mg  po q 6 hrs for spasms or anxiety.  Tongue was red and sore last week.  Mouth better today.  No edema.  Scattered rhonchi.  Keep lasix at 40mg  daily.

## 2018-06-14 DIAGNOSIS — E785 Hyperlipidemia, unspecified: Secondary | ICD-10-CM | POA: Diagnosis not present

## 2018-06-14 DIAGNOSIS — I5040 Unspecified combined systolic (congestive) and diastolic (congestive) heart failure: Secondary | ICD-10-CM | POA: Diagnosis not present

## 2018-06-14 DIAGNOSIS — I10 Essential (primary) hypertension: Secondary | ICD-10-CM | POA: Diagnosis not present

## 2018-06-14 DIAGNOSIS — R634 Abnormal weight loss: Secondary | ICD-10-CM | POA: Diagnosis not present

## 2018-06-14 DIAGNOSIS — E1159 Type 2 diabetes mellitus with other circulatory complications: Secondary | ICD-10-CM | POA: Diagnosis not present

## 2018-06-14 DIAGNOSIS — I25119 Atherosclerotic heart disease of native coronary artery with unspecified angina pectoris: Secondary | ICD-10-CM | POA: Diagnosis not present

## 2018-06-18 ENCOUNTER — Other Ambulatory Visit: Payer: Self-pay | Admitting: *Deleted

## 2018-06-18 DIAGNOSIS — E785 Hyperlipidemia, unspecified: Secondary | ICD-10-CM | POA: Diagnosis not present

## 2018-06-18 DIAGNOSIS — I10 Essential (primary) hypertension: Secondary | ICD-10-CM | POA: Diagnosis not present

## 2018-06-18 DIAGNOSIS — I25119 Atherosclerotic heart disease of native coronary artery with unspecified angina pectoris: Secondary | ICD-10-CM | POA: Diagnosis not present

## 2018-06-18 DIAGNOSIS — F32A Depression, unspecified: Secondary | ICD-10-CM

## 2018-06-18 DIAGNOSIS — R634 Abnormal weight loss: Secondary | ICD-10-CM | POA: Diagnosis not present

## 2018-06-18 DIAGNOSIS — I5040 Unspecified combined systolic (congestive) and diastolic (congestive) heart failure: Secondary | ICD-10-CM | POA: Diagnosis not present

## 2018-06-18 DIAGNOSIS — E1159 Type 2 diabetes mellitus with other circulatory complications: Secondary | ICD-10-CM | POA: Diagnosis not present

## 2018-06-18 DIAGNOSIS — F329 Major depressive disorder, single episode, unspecified: Secondary | ICD-10-CM

## 2018-06-18 MED ORDER — MIRTAZAPINE 7.5 MG PO TABS: 7.5000 mg | ORAL_TABLET | Freq: Every day | ORAL | 0 refills | Status: DC

## 2018-06-18 NOTE — Progress Notes (Signed)
[name] [name], [name] L, DO  Ilia Engelbert, Massie Kluver, RMA        Please notify [name] that we can begin remeron 7.5mg  po qhs for depression. I think we need to send it to his pharmacy also. Thanks.   Previous Messages    ----- Message -----  From: Eilene Ghazi, RMA  Sent: 06/18/2018  2:39 PM  To: Gayland Curry, DO  Subject: Medication for depression             Dr. Johnnette Barrios (RN) from [facility] called wanting to get medication for depression for this patient. Please Advise

## 2018-06-19 DIAGNOSIS — I5040 Unspecified combined systolic (congestive) and diastolic (congestive) heart failure: Secondary | ICD-10-CM | POA: Diagnosis not present

## 2018-06-19 DIAGNOSIS — R634 Abnormal weight loss: Secondary | ICD-10-CM | POA: Diagnosis not present

## 2018-06-19 DIAGNOSIS — I10 Essential (primary) hypertension: Secondary | ICD-10-CM | POA: Diagnosis not present

## 2018-06-19 DIAGNOSIS — E785 Hyperlipidemia, unspecified: Secondary | ICD-10-CM | POA: Diagnosis not present

## 2018-06-19 DIAGNOSIS — E1159 Type 2 diabetes mellitus with other circulatory complications: Secondary | ICD-10-CM | POA: Diagnosis not present

## 2018-06-19 DIAGNOSIS — I25119 Atherosclerotic heart disease of native coronary artery with unspecified angina pectoris: Secondary | ICD-10-CM | POA: Diagnosis not present

## 2018-06-21 DIAGNOSIS — I25119 Atherosclerotic heart disease of native coronary artery with unspecified angina pectoris: Secondary | ICD-10-CM | POA: Diagnosis not present

## 2018-06-21 DIAGNOSIS — E1159 Type 2 diabetes mellitus with other circulatory complications: Secondary | ICD-10-CM | POA: Diagnosis not present

## 2018-06-21 DIAGNOSIS — I10 Essential (primary) hypertension: Secondary | ICD-10-CM | POA: Diagnosis not present

## 2018-06-21 DIAGNOSIS — R634 Abnormal weight loss: Secondary | ICD-10-CM | POA: Diagnosis not present

## 2018-06-21 DIAGNOSIS — E785 Hyperlipidemia, unspecified: Secondary | ICD-10-CM | POA: Diagnosis not present

## 2018-06-21 DIAGNOSIS — I5040 Unspecified combined systolic (congestive) and diastolic (congestive) heart failure: Secondary | ICD-10-CM | POA: Diagnosis not present

## 2018-06-25 DIAGNOSIS — I25119 Atherosclerotic heart disease of native coronary artery with unspecified angina pectoris: Secondary | ICD-10-CM | POA: Diagnosis not present

## 2018-06-25 DIAGNOSIS — R634 Abnormal weight loss: Secondary | ICD-10-CM | POA: Diagnosis not present

## 2018-06-25 DIAGNOSIS — I10 Essential (primary) hypertension: Secondary | ICD-10-CM | POA: Diagnosis not present

## 2018-06-25 DIAGNOSIS — E1159 Type 2 diabetes mellitus with other circulatory complications: Secondary | ICD-10-CM | POA: Diagnosis not present

## 2018-06-25 DIAGNOSIS — I5040 Unspecified combined systolic (congestive) and diastolic (congestive) heart failure: Secondary | ICD-10-CM | POA: Diagnosis not present

## 2018-06-25 DIAGNOSIS — E785 Hyperlipidemia, unspecified: Secondary | ICD-10-CM | POA: Diagnosis not present

## 2018-06-26 DIAGNOSIS — E1159 Type 2 diabetes mellitus with other circulatory complications: Secondary | ICD-10-CM | POA: Diagnosis not present

## 2018-06-26 DIAGNOSIS — I10 Essential (primary) hypertension: Secondary | ICD-10-CM | POA: Diagnosis not present

## 2018-06-26 DIAGNOSIS — R634 Abnormal weight loss: Secondary | ICD-10-CM | POA: Diagnosis not present

## 2018-06-26 DIAGNOSIS — I25119 Atherosclerotic heart disease of native coronary artery with unspecified angina pectoris: Secondary | ICD-10-CM | POA: Diagnosis not present

## 2018-06-26 DIAGNOSIS — E785 Hyperlipidemia, unspecified: Secondary | ICD-10-CM | POA: Diagnosis not present

## 2018-06-26 DIAGNOSIS — I5040 Unspecified combined systolic (congestive) and diastolic (congestive) heart failure: Secondary | ICD-10-CM | POA: Diagnosis not present

## 2018-06-27 ENCOUNTER — Telehealth: Payer: Self-pay | Admitting: *Deleted

## 2018-06-27 NOTE — Telephone Encounter (Signed)
[name] [name] with Hospice called requesting verbal orders for Hospice Recert.  Verbal orders given.

## 2018-06-28 DIAGNOSIS — E785 Hyperlipidemia, unspecified: Secondary | ICD-10-CM | POA: Diagnosis not present

## 2018-06-28 DIAGNOSIS — E1159 Type 2 diabetes mellitus with other circulatory complications: Secondary | ICD-10-CM | POA: Diagnosis not present

## 2018-06-28 DIAGNOSIS — I10 Essential (primary) hypertension: Secondary | ICD-10-CM | POA: Diagnosis not present

## 2018-06-28 DIAGNOSIS — R634 Abnormal weight loss: Secondary | ICD-10-CM | POA: Diagnosis not present

## 2018-06-28 DIAGNOSIS — I25119 Atherosclerotic heart disease of native coronary artery with unspecified angina pectoris: Secondary | ICD-10-CM | POA: Diagnosis not present

## 2018-06-28 DIAGNOSIS — I5040 Unspecified combined systolic (congestive) and diastolic (congestive) heart failure: Secondary | ICD-10-CM | POA: Diagnosis not present

## 2018-07-02 DIAGNOSIS — I5040 Unspecified combined systolic (congestive) and diastolic (congestive) heart failure: Secondary | ICD-10-CM | POA: Diagnosis not present

## 2018-07-02 DIAGNOSIS — I10 Essential (primary) hypertension: Secondary | ICD-10-CM | POA: Diagnosis not present

## 2018-07-02 DIAGNOSIS — E785 Hyperlipidemia, unspecified: Secondary | ICD-10-CM | POA: Diagnosis not present

## 2018-07-02 DIAGNOSIS — I25119 Atherosclerotic heart disease of native coronary artery with unspecified angina pectoris: Secondary | ICD-10-CM | POA: Diagnosis not present

## 2018-07-02 DIAGNOSIS — R634 Abnormal weight loss: Secondary | ICD-10-CM | POA: Diagnosis not present

## 2018-07-02 DIAGNOSIS — E1159 Type 2 diabetes mellitus with other circulatory complications: Secondary | ICD-10-CM | POA: Diagnosis not present

## 2018-07-05 DIAGNOSIS — I5040 Unspecified combined systolic (congestive) and diastolic (congestive) heart failure: Secondary | ICD-10-CM | POA: Diagnosis not present

## 2018-07-05 DIAGNOSIS — I10 Essential (primary) hypertension: Secondary | ICD-10-CM | POA: Diagnosis not present

## 2018-07-05 DIAGNOSIS — I25119 Atherosclerotic heart disease of native coronary artery with unspecified angina pectoris: Secondary | ICD-10-CM | POA: Diagnosis not present

## 2018-07-05 DIAGNOSIS — E1159 Type 2 diabetes mellitus with other circulatory complications: Secondary | ICD-10-CM | POA: Diagnosis not present

## 2018-07-05 DIAGNOSIS — R634 Abnormal weight loss: Secondary | ICD-10-CM | POA: Diagnosis not present

## 2018-07-05 DIAGNOSIS — E785 Hyperlipidemia, unspecified: Secondary | ICD-10-CM | POA: Diagnosis not present

## 2018-07-08 DIAGNOSIS — R634 Abnormal weight loss: Secondary | ICD-10-CM | POA: Diagnosis not present

## 2018-07-08 DIAGNOSIS — E1159 Type 2 diabetes mellitus with other circulatory complications: Secondary | ICD-10-CM | POA: Diagnosis not present

## 2018-07-08 DIAGNOSIS — I10 Essential (primary) hypertension: Secondary | ICD-10-CM | POA: Diagnosis not present

## 2018-07-08 DIAGNOSIS — E785 Hyperlipidemia, unspecified: Secondary | ICD-10-CM | POA: Diagnosis not present

## 2018-07-08 DIAGNOSIS — I5040 Unspecified combined systolic (congestive) and diastolic (congestive) heart failure: Secondary | ICD-10-CM | POA: Diagnosis not present

## 2018-07-08 DIAGNOSIS — I25119 Atherosclerotic heart disease of native coronary artery with unspecified angina pectoris: Secondary | ICD-10-CM | POA: Diagnosis not present

## 2018-07-11 ENCOUNTER — Other Ambulatory Visit: Payer: Self-pay | Admitting: *Deleted

## 2018-07-11 DIAGNOSIS — D649 Anemia, unspecified: Secondary | ICD-10-CM | POA: Diagnosis not present

## 2018-07-11 DIAGNOSIS — N401 Enlarged prostate with lower urinary tract symptoms: Secondary | ICD-10-CM | POA: Diagnosis not present

## 2018-07-11 DIAGNOSIS — I10 Essential (primary) hypertension: Secondary | ICD-10-CM | POA: Diagnosis not present

## 2018-07-11 DIAGNOSIS — K219 Gastro-esophageal reflux disease without esophagitis: Secondary | ICD-10-CM | POA: Diagnosis not present

## 2018-07-11 DIAGNOSIS — F32A Depression, unspecified: Secondary | ICD-10-CM

## 2018-07-11 DIAGNOSIS — E785 Hyperlipidemia, unspecified: Secondary | ICD-10-CM | POA: Diagnosis not present

## 2018-07-11 DIAGNOSIS — H409 Unspecified glaucoma: Secondary | ICD-10-CM | POA: Diagnosis not present

## 2018-07-11 DIAGNOSIS — F329 Major depressive disorder, single episode, unspecified: Secondary | ICD-10-CM

## 2018-07-11 DIAGNOSIS — R634 Abnormal weight loss: Secondary | ICD-10-CM | POA: Diagnosis not present

## 2018-07-11 DIAGNOSIS — I5040 Unspecified combined systolic (congestive) and diastolic (congestive) heart failure: Secondary | ICD-10-CM | POA: Diagnosis not present

## 2018-07-11 DIAGNOSIS — K222 Esophageal obstruction: Secondary | ICD-10-CM | POA: Diagnosis not present

## 2018-07-11 DIAGNOSIS — E1159 Type 2 diabetes mellitus with other circulatory complications: Secondary | ICD-10-CM | POA: Diagnosis not present

## 2018-07-11 DIAGNOSIS — N184 Chronic kidney disease, stage 4 (severe): Secondary | ICD-10-CM | POA: Diagnosis not present

## 2018-07-11 DIAGNOSIS — I25119 Atherosclerotic heart disease of native coronary artery with unspecified angina pectoris: Secondary | ICD-10-CM | POA: Diagnosis not present

## 2018-07-11 MED ORDER — MIRTAZAPINE 7.5 MG PO TABS: 7.5000 mg | ORAL_TABLET | Freq: Every day | ORAL | 3 refills | Status: AC

## 2018-07-11 NOTE — Telephone Encounter (Signed)
[name] Edison Pace called and stated that patient is doing well on the [facility] and needs a refill. Faxed to pharmacy.

## 2018-07-16 DIAGNOSIS — E785 Hyperlipidemia, unspecified: Secondary | ICD-10-CM | POA: Diagnosis not present

## 2018-07-16 DIAGNOSIS — I25119 Atherosclerotic heart disease of native coronary artery with unspecified angina pectoris: Secondary | ICD-10-CM | POA: Diagnosis not present

## 2018-07-16 DIAGNOSIS — R634 Abnormal weight loss: Secondary | ICD-10-CM | POA: Diagnosis not present

## 2018-07-16 DIAGNOSIS — I5040 Unspecified combined systolic (congestive) and diastolic (congestive) heart failure: Secondary | ICD-10-CM | POA: Diagnosis not present

## 2018-07-16 DIAGNOSIS — I10 Essential (primary) hypertension: Secondary | ICD-10-CM | POA: Diagnosis not present

## 2018-07-16 DIAGNOSIS — E1159 Type 2 diabetes mellitus with other circulatory complications: Secondary | ICD-10-CM | POA: Diagnosis not present

## 2018-07-17 DIAGNOSIS — R634 Abnormal weight loss: Secondary | ICD-10-CM | POA: Diagnosis not present

## 2018-07-17 DIAGNOSIS — E785 Hyperlipidemia, unspecified: Secondary | ICD-10-CM | POA: Diagnosis not present

## 2018-07-17 DIAGNOSIS — I25119 Atherosclerotic heart disease of native coronary artery with unspecified angina pectoris: Secondary | ICD-10-CM | POA: Diagnosis not present

## 2018-07-17 DIAGNOSIS — I5040 Unspecified combined systolic (congestive) and diastolic (congestive) heart failure: Secondary | ICD-10-CM | POA: Diagnosis not present

## 2018-07-17 DIAGNOSIS — E1159 Type 2 diabetes mellitus with other circulatory complications: Secondary | ICD-10-CM | POA: Diagnosis not present

## 2018-07-17 DIAGNOSIS — I10 Essential (primary) hypertension: Secondary | ICD-10-CM | POA: Diagnosis not present

## 2018-07-18 ENCOUNTER — Telehealth (HOSPITAL_COMMUNITY): Payer: Self-pay | Admitting: *Deleted

## 2018-07-18 NOTE — Telephone Encounter (Signed)
-----   Message from Kara Mead sent at 07/18/2018  9:29 AM EDT ----- Regarding: Hospice Hey,  FYI.Marland KitchenMarland KitchenPatient's spouse called and cancelled his 08/05/18 f/u appt.  She stated pt is now under Hospice and is bedridden.  Thanks!

## 2018-07-19 DIAGNOSIS — I10 Essential (primary) hypertension: Secondary | ICD-10-CM | POA: Diagnosis not present

## 2018-07-19 DIAGNOSIS — I25119 Atherosclerotic heart disease of native coronary artery with unspecified angina pectoris: Secondary | ICD-10-CM | POA: Diagnosis not present

## 2018-07-19 DIAGNOSIS — R634 Abnormal weight loss: Secondary | ICD-10-CM | POA: Diagnosis not present

## 2018-07-19 DIAGNOSIS — E1159 Type 2 diabetes mellitus with other circulatory complications: Secondary | ICD-10-CM | POA: Diagnosis not present

## 2018-07-19 DIAGNOSIS — E785 Hyperlipidemia, unspecified: Secondary | ICD-10-CM | POA: Diagnosis not present

## 2018-07-19 DIAGNOSIS — I5040 Unspecified combined systolic (congestive) and diastolic (congestive) heart failure: Secondary | ICD-10-CM | POA: Diagnosis not present

## 2018-07-23 DIAGNOSIS — I5040 Unspecified combined systolic (congestive) and diastolic (congestive) heart failure: Secondary | ICD-10-CM | POA: Diagnosis not present

## 2018-07-23 DIAGNOSIS — E785 Hyperlipidemia, unspecified: Secondary | ICD-10-CM | POA: Diagnosis not present

## 2018-07-23 DIAGNOSIS — E1159 Type 2 diabetes mellitus with other circulatory complications: Secondary | ICD-10-CM | POA: Diagnosis not present

## 2018-07-23 DIAGNOSIS — I10 Essential (primary) hypertension: Secondary | ICD-10-CM | POA: Diagnosis not present

## 2018-07-23 DIAGNOSIS — R634 Abnormal weight loss: Secondary | ICD-10-CM | POA: Diagnosis not present

## 2018-07-23 DIAGNOSIS — I25119 Atherosclerotic heart disease of native coronary artery with unspecified angina pectoris: Secondary | ICD-10-CM | POA: Diagnosis not present

## 2018-07-26 DIAGNOSIS — I5040 Unspecified combined systolic (congestive) and diastolic (congestive) heart failure: Secondary | ICD-10-CM | POA: Diagnosis not present

## 2018-07-26 DIAGNOSIS — I25119 Atherosclerotic heart disease of native coronary artery with unspecified angina pectoris: Secondary | ICD-10-CM | POA: Diagnosis not present

## 2018-07-26 DIAGNOSIS — E785 Hyperlipidemia, unspecified: Secondary | ICD-10-CM | POA: Diagnosis not present

## 2018-07-26 DIAGNOSIS — I10 Essential (primary) hypertension: Secondary | ICD-10-CM | POA: Diagnosis not present

## 2018-07-26 DIAGNOSIS — R634 Abnormal weight loss: Secondary | ICD-10-CM | POA: Diagnosis not present

## 2018-07-26 DIAGNOSIS — E1159 Type 2 diabetes mellitus with other circulatory complications: Secondary | ICD-10-CM | POA: Diagnosis not present

## 2018-07-30 ENCOUNTER — Telehealth: Payer: Self-pay | Admitting: *Deleted

## 2018-07-30 DIAGNOSIS — R634 Abnormal weight loss: Secondary | ICD-10-CM | POA: Diagnosis not present

## 2018-07-30 DIAGNOSIS — E785 Hyperlipidemia, unspecified: Secondary | ICD-10-CM | POA: Diagnosis not present

## 2018-07-30 DIAGNOSIS — E118 Type 2 diabetes mellitus with unspecified complications: Secondary | ICD-10-CM

## 2018-07-30 DIAGNOSIS — L609 Nail disorder, unspecified: Secondary | ICD-10-CM

## 2018-07-30 DIAGNOSIS — I10 Essential (primary) hypertension: Secondary | ICD-10-CM | POA: Diagnosis not present

## 2018-07-30 DIAGNOSIS — I5040 Unspecified combined systolic (congestive) and diastolic (congestive) heart failure: Secondary | ICD-10-CM | POA: Diagnosis not present

## 2018-07-30 DIAGNOSIS — E1159 Type 2 diabetes mellitus with other circulatory complications: Secondary | ICD-10-CM | POA: Diagnosis not present

## 2018-07-30 DIAGNOSIS — I25119 Atherosclerotic heart disease of native coronary artery with unspecified angina pectoris: Secondary | ICD-10-CM | POA: Diagnosis not present

## 2018-07-30 NOTE — Telephone Encounter (Signed)
Referral information faxed to [facility],no return call yet. Awaiting response

## 2018-07-30 NOTE — Telephone Encounter (Signed)
Pt on hospice and daughter calling asking for a podiatrist that would come out to the home and cut pt's toenails. Per daughter hospice nurse stating that pt's toenails needs trimming.    Spoke with Chare ( filling in for referral person) who contacted [facility] and waiting for there return phone call. [phone]. She left message for them to return her call

## 2018-07-31 ENCOUNTER — Telehealth: Payer: Self-pay | Admitting: *Deleted

## 2018-07-31 NOTE — Telephone Encounter (Signed)
Patient's daughter aware referral in process

## 2018-07-31 NOTE — Telephone Encounter (Signed)
[name] [name], daughter called and stated that patient has been having some mental status changes and wants Dr. Cyndi Lennert opinion. Dr. Mariea Clonts is out of office till 9/3. Patient is on Hospice so daughter is going to ask the Hospice nurse to evaluate patient.

## 2018-08-02 DIAGNOSIS — I10 Essential (primary) hypertension: Secondary | ICD-10-CM | POA: Diagnosis not present

## 2018-08-02 DIAGNOSIS — R634 Abnormal weight loss: Secondary | ICD-10-CM | POA: Diagnosis not present

## 2018-08-02 DIAGNOSIS — I5040 Unspecified combined systolic (congestive) and diastolic (congestive) heart failure: Secondary | ICD-10-CM | POA: Diagnosis not present

## 2018-08-02 DIAGNOSIS — I25119 Atherosclerotic heart disease of native coronary artery with unspecified angina pectoris: Secondary | ICD-10-CM | POA: Diagnosis not present

## 2018-08-02 DIAGNOSIS — E1159 Type 2 diabetes mellitus with other circulatory complications: Secondary | ICD-10-CM | POA: Diagnosis not present

## 2018-08-02 DIAGNOSIS — E785 Hyperlipidemia, unspecified: Secondary | ICD-10-CM | POA: Diagnosis not present

## 2018-08-03 DIAGNOSIS — I10 Essential (primary) hypertension: Secondary | ICD-10-CM | POA: Diagnosis not present

## 2018-08-03 DIAGNOSIS — R634 Abnormal weight loss: Secondary | ICD-10-CM | POA: Diagnosis not present

## 2018-08-03 DIAGNOSIS — E1159 Type 2 diabetes mellitus with other circulatory complications: Secondary | ICD-10-CM | POA: Diagnosis not present

## 2018-08-03 DIAGNOSIS — E785 Hyperlipidemia, unspecified: Secondary | ICD-10-CM | POA: Diagnosis not present

## 2018-08-03 DIAGNOSIS — I25119 Atherosclerotic heart disease of native coronary artery with unspecified angina pectoris: Secondary | ICD-10-CM | POA: Diagnosis not present

## 2018-08-03 DIAGNOSIS — I5040 Unspecified combined systolic (congestive) and diastolic (congestive) heart failure: Secondary | ICD-10-CM | POA: Diagnosis not present

## 2018-08-05 ENCOUNTER — Encounter (HOSPITAL_COMMUNITY): Payer: Medicare Other | Admitting: Cardiology

## 2018-08-05 ENCOUNTER — Telehealth: Payer: Self-pay | Admitting: *Deleted

## 2018-08-05 DIAGNOSIS — R634 Abnormal weight loss: Secondary | ICD-10-CM | POA: Diagnosis not present

## 2018-08-05 DIAGNOSIS — E1159 Type 2 diabetes mellitus with other circulatory complications: Secondary | ICD-10-CM | POA: Diagnosis not present

## 2018-08-05 DIAGNOSIS — I10 Essential (primary) hypertension: Secondary | ICD-10-CM | POA: Diagnosis not present

## 2018-08-05 DIAGNOSIS — I25119 Atherosclerotic heart disease of native coronary artery with unspecified angina pectoris: Secondary | ICD-10-CM | POA: Diagnosis not present

## 2018-08-05 DIAGNOSIS — F419 Anxiety disorder, unspecified: Secondary | ICD-10-CM

## 2018-08-05 DIAGNOSIS — E785 Hyperlipidemia, unspecified: Secondary | ICD-10-CM | POA: Diagnosis not present

## 2018-08-05 DIAGNOSIS — K224 Dyskinesia of esophagus: Secondary | ICD-10-CM

## 2018-08-05 DIAGNOSIS — I5040 Unspecified combined systolic (congestive) and diastolic (congestive) heart failure: Secondary | ICD-10-CM | POA: Diagnosis not present

## 2018-08-05 NOTE — Telephone Encounter (Signed)
Patient is currently taking Lorazepam 0.5 mg 1 tablet every 6 hours, what would the increase be?

## 2018-08-05 NOTE — Telephone Encounter (Signed)
[name] hospice nurse calling asking for a increase in lorazepam 0.5 mg take 1 tablet by mouth every 6 hours as needed.  pt is being more combative per family, pt's agitation has increased and " hard to handle". The on-call hospice physician suggested Haldol (not sure of the dose) but the pharmacy couldn't get that over the weekend. Wife gave pt 0.25 mg 3:30 pm on Saturday afternoon, then at 5:30 pm she gave 0.5mg , then at 7pm she gave 0.5mg , then at 1am because he was still trying to get out of bed she gave 1mg .   Please advise he's asking for 0.5 -1mg  every 6 hours

## 2018-08-05 NOTE — Telephone Encounter (Signed)
Ok to provide lorazepam 0.5 mg every 6 hour as needed for anxiety/ agitation for 3 days. Have Cindi Carbon evaluate patient tomorrow when she is at the facility and adjust dosing as appropriate and write script for more days if appropriate.

## 2018-08-06 NOTE — Telephone Encounter (Signed)
Per verbal from Dr. Bubba Camp, ok to increase to 0.5mg  every 4 hours as needed anxiety    Spoke with [name], hospice nurse and advised results, pt has enough ativan and doesn't need a rx at this time.

## 2018-08-07 DIAGNOSIS — R634 Abnormal weight loss: Secondary | ICD-10-CM | POA: Diagnosis not present

## 2018-08-07 DIAGNOSIS — I5040 Unspecified combined systolic (congestive) and diastolic (congestive) heart failure: Secondary | ICD-10-CM | POA: Diagnosis not present

## 2018-08-07 DIAGNOSIS — I25119 Atherosclerotic heart disease of native coronary artery with unspecified angina pectoris: Secondary | ICD-10-CM | POA: Diagnosis not present

## 2018-08-07 DIAGNOSIS — E1159 Type 2 diabetes mellitus with other circulatory complications: Secondary | ICD-10-CM | POA: Diagnosis not present

## 2018-08-07 DIAGNOSIS — I10 Essential (primary) hypertension: Secondary | ICD-10-CM | POA: Diagnosis not present

## 2018-08-07 DIAGNOSIS — E785 Hyperlipidemia, unspecified: Secondary | ICD-10-CM | POA: Diagnosis not present

## 2018-08-08 DIAGNOSIS — R634 Abnormal weight loss: Secondary | ICD-10-CM | POA: Diagnosis not present

## 2018-08-08 DIAGNOSIS — I10 Essential (primary) hypertension: Secondary | ICD-10-CM | POA: Diagnosis not present

## 2018-08-08 DIAGNOSIS — I25119 Atherosclerotic heart disease of native coronary artery with unspecified angina pectoris: Secondary | ICD-10-CM | POA: Diagnosis not present

## 2018-08-08 DIAGNOSIS — I5040 Unspecified combined systolic (congestive) and diastolic (congestive) heart failure: Secondary | ICD-10-CM | POA: Diagnosis not present

## 2018-08-08 DIAGNOSIS — E1159 Type 2 diabetes mellitus with other circulatory complications: Secondary | ICD-10-CM | POA: Diagnosis not present

## 2018-08-08 DIAGNOSIS — E785 Hyperlipidemia, unspecified: Secondary | ICD-10-CM | POA: Diagnosis not present

## 2018-08-09 ENCOUNTER — Telehealth: Payer: Self-pay | Admitting: *Deleted

## 2018-08-09 ENCOUNTER — Encounter: Payer: Self-pay | Admitting: Internal Medicine

## 2018-08-09 DIAGNOSIS — R634 Abnormal weight loss: Secondary | ICD-10-CM | POA: Diagnosis not present

## 2018-08-09 DIAGNOSIS — E1159 Type 2 diabetes mellitus with other circulatory complications: Secondary | ICD-10-CM | POA: Diagnosis not present

## 2018-08-09 DIAGNOSIS — E785 Hyperlipidemia, unspecified: Secondary | ICD-10-CM | POA: Diagnosis not present

## 2018-08-09 DIAGNOSIS — I10 Essential (primary) hypertension: Secondary | ICD-10-CM | POA: Diagnosis not present

## 2018-08-09 DIAGNOSIS — I5040 Unspecified combined systolic (congestive) and diastolic (congestive) heart failure: Secondary | ICD-10-CM | POA: Diagnosis not present

## 2018-08-09 DIAGNOSIS — I25119 Atherosclerotic heart disease of native coronary artery with unspecified angina pectoris: Secondary | ICD-10-CM | POA: Diagnosis not present

## 2018-08-09 NOTE — Telephone Encounter (Signed)
Sherry with Hospice called and stated that patient is still having Increased Agitation. Stated that his Ativan was increased on  08/05/18.  Hospice is wanting an order to check urine to rule out UTI. Family agrees.  Please Advise. (Dr. Cyndi Lennert patient)

## 2018-08-09 NOTE — Telephone Encounter (Signed)
Verbal Order given to [facility] with Hospice.

## 2018-08-09 NOTE — Telephone Encounter (Signed)
  Ok to send urine for study.

## 2018-08-11 DIAGNOSIS — R634 Abnormal weight loss: Secondary | ICD-10-CM | POA: Diagnosis not present

## 2018-08-11 DIAGNOSIS — I10 Essential (primary) hypertension: Secondary | ICD-10-CM | POA: Diagnosis not present

## 2018-08-11 DIAGNOSIS — N184 Chronic kidney disease, stage 4 (severe): Secondary | ICD-10-CM | POA: Diagnosis not present

## 2018-08-11 DIAGNOSIS — K222 Esophageal obstruction: Secondary | ICD-10-CM | POA: Diagnosis not present

## 2018-08-11 DIAGNOSIS — I5040 Unspecified combined systolic (congestive) and diastolic (congestive) heart failure: Secondary | ICD-10-CM | POA: Diagnosis not present

## 2018-08-11 DIAGNOSIS — K219 Gastro-esophageal reflux disease without esophagitis: Secondary | ICD-10-CM | POA: Diagnosis not present

## 2018-08-11 DIAGNOSIS — E1159 Type 2 diabetes mellitus with other circulatory complications: Secondary | ICD-10-CM | POA: Diagnosis not present

## 2018-08-11 DIAGNOSIS — H409 Unspecified glaucoma: Secondary | ICD-10-CM | POA: Diagnosis not present

## 2018-08-11 DIAGNOSIS — I25119 Atherosclerotic heart disease of native coronary artery with unspecified angina pectoris: Secondary | ICD-10-CM | POA: Diagnosis not present

## 2018-08-11 DIAGNOSIS — E785 Hyperlipidemia, unspecified: Secondary | ICD-10-CM | POA: Diagnosis not present

## 2018-08-11 DIAGNOSIS — N401 Enlarged prostate with lower urinary tract symptoms: Secondary | ICD-10-CM | POA: Diagnosis not present

## 2018-08-11 DIAGNOSIS — D649 Anemia, unspecified: Secondary | ICD-10-CM | POA: Diagnosis not present

## 2018-08-13 DIAGNOSIS — I5040 Unspecified combined systolic (congestive) and diastolic (congestive) heart failure: Secondary | ICD-10-CM | POA: Diagnosis not present

## 2018-08-13 DIAGNOSIS — I10 Essential (primary) hypertension: Secondary | ICD-10-CM | POA: Diagnosis not present

## 2018-08-13 DIAGNOSIS — I25119 Atherosclerotic heart disease of native coronary artery with unspecified angina pectoris: Secondary | ICD-10-CM | POA: Diagnosis not present

## 2018-08-13 DIAGNOSIS — E785 Hyperlipidemia, unspecified: Secondary | ICD-10-CM | POA: Diagnosis not present

## 2018-08-13 DIAGNOSIS — R634 Abnormal weight loss: Secondary | ICD-10-CM | POA: Diagnosis not present

## 2018-08-13 DIAGNOSIS — E1159 Type 2 diabetes mellitus with other circulatory complications: Secondary | ICD-10-CM | POA: Diagnosis not present

## 2018-08-14 DIAGNOSIS — I10 Essential (primary) hypertension: Secondary | ICD-10-CM | POA: Diagnosis not present

## 2018-08-14 DIAGNOSIS — I25119 Atherosclerotic heart disease of native coronary artery with unspecified angina pectoris: Secondary | ICD-10-CM | POA: Diagnosis not present

## 2018-08-14 DIAGNOSIS — E1159 Type 2 diabetes mellitus with other circulatory complications: Secondary | ICD-10-CM | POA: Diagnosis not present

## 2018-08-14 DIAGNOSIS — E785 Hyperlipidemia, unspecified: Secondary | ICD-10-CM | POA: Diagnosis not present

## 2018-08-14 DIAGNOSIS — R634 Abnormal weight loss: Secondary | ICD-10-CM | POA: Diagnosis not present

## 2018-08-14 DIAGNOSIS — I5040 Unspecified combined systolic (congestive) and diastolic (congestive) heart failure: Secondary | ICD-10-CM | POA: Diagnosis not present

## 2018-08-15 DIAGNOSIS — E785 Hyperlipidemia, unspecified: Secondary | ICD-10-CM | POA: Diagnosis not present

## 2018-08-15 DIAGNOSIS — I25119 Atherosclerotic heart disease of native coronary artery with unspecified angina pectoris: Secondary | ICD-10-CM | POA: Diagnosis not present

## 2018-08-15 DIAGNOSIS — I5040 Unspecified combined systolic (congestive) and diastolic (congestive) heart failure: Secondary | ICD-10-CM | POA: Diagnosis not present

## 2018-08-15 DIAGNOSIS — I10 Essential (primary) hypertension: Secondary | ICD-10-CM | POA: Diagnosis not present

## 2018-08-15 DIAGNOSIS — R634 Abnormal weight loss: Secondary | ICD-10-CM | POA: Diagnosis not present

## 2018-08-15 DIAGNOSIS — E1159 Type 2 diabetes mellitus with other circulatory complications: Secondary | ICD-10-CM | POA: Diagnosis not present

## 2018-08-20 DIAGNOSIS — R634 Abnormal weight loss: Secondary | ICD-10-CM | POA: Diagnosis not present

## 2018-08-20 DIAGNOSIS — I10 Essential (primary) hypertension: Secondary | ICD-10-CM | POA: Diagnosis not present

## 2018-08-20 DIAGNOSIS — E785 Hyperlipidemia, unspecified: Secondary | ICD-10-CM | POA: Diagnosis not present

## 2018-08-20 DIAGNOSIS — I5040 Unspecified combined systolic (congestive) and diastolic (congestive) heart failure: Secondary | ICD-10-CM | POA: Diagnosis not present

## 2018-08-20 DIAGNOSIS — I25119 Atherosclerotic heart disease of native coronary artery with unspecified angina pectoris: Secondary | ICD-10-CM | POA: Diagnosis not present

## 2018-08-20 DIAGNOSIS — E1159 Type 2 diabetes mellitus with other circulatory complications: Secondary | ICD-10-CM | POA: Diagnosis not present

## 2018-08-21 ENCOUNTER — Telehealth: Payer: Self-pay

## 2018-08-21 NOTE — Telephone Encounter (Signed)
Patient's daughter, [name] [name], would like a call from Dr. Mariea Clonts regarding changes in patient's mental status and continued medical care. She would like a call back today but no later than tomorrow if possible. She feels that it is important that she speaks directly with provider. Mrs. [name] [name] can be reached at [phone]

## 2018-08-22 NOTE — Telephone Encounter (Signed)
Per Corky Sox (pt's daughter), over the past weeks pt has became more agitated and confusions at night.  Pt's wife has cancelled all appts, daughter wants labs done and really confused on what's going on. Daughter is asking for a call from Dr. Mariea Clonts.

## 2018-08-22 NOTE — Telephone Encounter (Signed)
Please call Corky Sox and get more details about what's going on so I can respond.  I have not gotten any recent calls from hospice about Mr. Molder.

## 2018-08-23 DIAGNOSIS — I5040 Unspecified combined systolic (congestive) and diastolic (congestive) heart failure: Secondary | ICD-10-CM | POA: Diagnosis not present

## 2018-08-23 DIAGNOSIS — I10 Essential (primary) hypertension: Secondary | ICD-10-CM | POA: Diagnosis not present

## 2018-08-23 DIAGNOSIS — R634 Abnormal weight loss: Secondary | ICD-10-CM | POA: Diagnosis not present

## 2018-08-23 DIAGNOSIS — E785 Hyperlipidemia, unspecified: Secondary | ICD-10-CM | POA: Diagnosis not present

## 2018-08-23 DIAGNOSIS — I25119 Atherosclerotic heart disease of native coronary artery with unspecified angina pectoris: Secondary | ICD-10-CM | POA: Diagnosis not present

## 2018-08-23 DIAGNOSIS — E1159 Type 2 diabetes mellitus with other circulatory complications: Secondary | ICD-10-CM | POA: Diagnosis not present

## 2018-08-26 ENCOUNTER — Telehealth: Payer: Self-pay | Admitting: *Deleted

## 2018-08-26 DIAGNOSIS — I5040 Unspecified combined systolic (congestive) and diastolic (congestive) heart failure: Secondary | ICD-10-CM | POA: Diagnosis not present

## 2018-08-26 DIAGNOSIS — I25119 Atherosclerotic heart disease of native coronary artery with unspecified angina pectoris: Secondary | ICD-10-CM | POA: Diagnosis not present

## 2018-08-26 DIAGNOSIS — I10 Essential (primary) hypertension: Secondary | ICD-10-CM | POA: Diagnosis not present

## 2018-08-26 DIAGNOSIS — E1159 Type 2 diabetes mellitus with other circulatory complications: Secondary | ICD-10-CM | POA: Diagnosis not present

## 2018-08-26 DIAGNOSIS — E785 Hyperlipidemia, unspecified: Secondary | ICD-10-CM | POA: Diagnosis not present

## 2018-08-26 DIAGNOSIS — R634 Abnormal weight loss: Secondary | ICD-10-CM | POA: Diagnosis not present

## 2018-08-26 NOTE — Telephone Encounter (Signed)
Thomes Dinning with Hospice called and just wanted to update you on medication changes. Stated that they have added: 08/14/2018-Seroquel 25mg  daily with supper for aggitation 08/23/18-Seroquel was increased to 50mg  daily with supper  Stated that patient has Lorazepam 0.5mg  every 6 hours as needed for anxiety/spasms and they have also ADDED Lorazepam 0.5mg  at bedtime  Medication list updated.

## 2018-08-26 NOTE — Telephone Encounter (Signed)
Patient called and stated that she has not heard from Dr. Mariea Clonts yet. Stated that she still has concerns and wants to speak with Dr. Mariea Clonts today before 3:00pm. Please call [phone]

## 2018-08-26 NOTE — Telephone Encounter (Signed)
My suggestion is that she speak with the hospice providers directly who are currently seeing her father.  Unfortunately, I am not able to provide reasonable advice without laying eyes on him.  If they feel like he needs to be seen in the office, they should make an appt; however, typically patients at end of life are too weak and delirious to make a trip to an office comfortably.

## 2018-08-26 NOTE — Telephone Encounter (Signed)
Marcie, daughter notified and agreed.

## 2018-08-27 DIAGNOSIS — I10 Essential (primary) hypertension: Secondary | ICD-10-CM | POA: Diagnosis not present

## 2018-08-27 DIAGNOSIS — R634 Abnormal weight loss: Secondary | ICD-10-CM | POA: Diagnosis not present

## 2018-08-27 DIAGNOSIS — I5040 Unspecified combined systolic (congestive) and diastolic (congestive) heart failure: Secondary | ICD-10-CM | POA: Diagnosis not present

## 2018-08-27 DIAGNOSIS — E785 Hyperlipidemia, unspecified: Secondary | ICD-10-CM | POA: Diagnosis not present

## 2018-08-27 DIAGNOSIS — I25119 Atherosclerotic heart disease of native coronary artery with unspecified angina pectoris: Secondary | ICD-10-CM | POA: Diagnosis not present

## 2018-08-27 DIAGNOSIS — E1159 Type 2 diabetes mellitus with other circulatory complications: Secondary | ICD-10-CM | POA: Diagnosis not present

## 2018-08-29 DIAGNOSIS — I5040 Unspecified combined systolic (congestive) and diastolic (congestive) heart failure: Secondary | ICD-10-CM | POA: Diagnosis not present

## 2018-08-29 DIAGNOSIS — R634 Abnormal weight loss: Secondary | ICD-10-CM | POA: Diagnosis not present

## 2018-08-29 DIAGNOSIS — E785 Hyperlipidemia, unspecified: Secondary | ICD-10-CM | POA: Diagnosis not present

## 2018-08-29 DIAGNOSIS — I25119 Atherosclerotic heart disease of native coronary artery with unspecified angina pectoris: Secondary | ICD-10-CM | POA: Diagnosis not present

## 2018-08-29 DIAGNOSIS — I10 Essential (primary) hypertension: Secondary | ICD-10-CM | POA: Diagnosis not present

## 2018-08-29 DIAGNOSIS — E1159 Type 2 diabetes mellitus with other circulatory complications: Secondary | ICD-10-CM | POA: Diagnosis not present

## 2018-08-30 ENCOUNTER — Telehealth: Payer: Self-pay | Admitting: *Deleted

## 2018-08-30 DIAGNOSIS — E785 Hyperlipidemia, unspecified: Secondary | ICD-10-CM | POA: Diagnosis not present

## 2018-08-30 DIAGNOSIS — R634 Abnormal weight loss: Secondary | ICD-10-CM | POA: Diagnosis not present

## 2018-08-30 DIAGNOSIS — I5040 Unspecified combined systolic (congestive) and diastolic (congestive) heart failure: Secondary | ICD-10-CM | POA: Diagnosis not present

## 2018-08-30 DIAGNOSIS — I10 Essential (primary) hypertension: Secondary | ICD-10-CM | POA: Diagnosis not present

## 2018-08-30 DIAGNOSIS — I25119 Atherosclerotic heart disease of native coronary artery with unspecified angina pectoris: Secondary | ICD-10-CM | POA: Diagnosis not present

## 2018-08-30 DIAGNOSIS — E1159 Type 2 diabetes mellitus with other circulatory complications: Secondary | ICD-10-CM | POA: Diagnosis not present

## 2018-08-30 MED ORDER — QUETIAPINE FUMARATE 50 MG PO TABS: 50.0000 mg | ORAL_TABLET | Freq: Every evening | ORAL | 1 refills | Status: AC

## 2018-08-30 MED ORDER — LORAZEPAM 1 MG PO TABS: 1.0000 mg | ORAL_TABLET | Freq: Every day | ORAL | 0 refills | Status: AC

## 2018-08-30 NOTE — Telephone Encounter (Signed)
Ok with me 

## 2018-08-30 NOTE — Telephone Encounter (Signed)
Spoke with Bettina Gavia is aware of Dr.[name]'s response, I confirmed pharmacy, and phoned in rx's  CB

## 2018-08-30 NOTE — Telephone Encounter (Signed)
Hospice nurse calling asking for order for 1mg  ativan 1 tablet at bedtime this is given with the Seroquel 50mg  at dinner. Per wife this works and keeps pt calm and in bed all night.

## 2018-09-02 ENCOUNTER — Other Ambulatory Visit: Payer: Self-pay | Admitting: *Deleted

## 2018-09-02 DIAGNOSIS — E785 Hyperlipidemia, unspecified: Secondary | ICD-10-CM | POA: Diagnosis not present

## 2018-09-02 DIAGNOSIS — I25119 Atherosclerotic heart disease of native coronary artery with unspecified angina pectoris: Secondary | ICD-10-CM | POA: Diagnosis not present

## 2018-09-02 DIAGNOSIS — I10 Essential (primary) hypertension: Secondary | ICD-10-CM | POA: Diagnosis not present

## 2018-09-02 DIAGNOSIS — E1159 Type 2 diabetes mellitus with other circulatory complications: Secondary | ICD-10-CM | POA: Diagnosis not present

## 2018-09-02 DIAGNOSIS — J690 Pneumonitis due to inhalation of food and vomit: Secondary | ICD-10-CM

## 2018-09-02 DIAGNOSIS — I5040 Unspecified combined systolic (congestive) and diastolic (congestive) heart failure: Secondary | ICD-10-CM | POA: Diagnosis not present

## 2018-09-02 DIAGNOSIS — R634 Abnormal weight loss: Secondary | ICD-10-CM | POA: Diagnosis not present

## 2018-09-02 MED ORDER — AMOXICILLIN-POT CLAVULANATE 250-62.5 MG/5ML PO SUSR: 250.0000 mg | Freq: Three times a day (TID) | ORAL | 0 refills | Status: DC

## 2018-09-02 MED ORDER — AMOXICILLIN-POT CLAVULANATE 250-62.5 MG/5ML PO SUSR
250.0000 mg | Freq: Three times a day (TID) | ORAL | 0 refills | Status: AC
Start: 2018-09-02 — End: 2018-09-12

## 2018-09-02 NOTE — Telephone Encounter (Signed)
[name] hospice nurse calling asking for order for abx for person that he thinks has aspirated. Per [name] pt choked on cream of wheat, pt coughing, vomited a little, temp 101.8, bp 134/60, resp 32, O2 82% . Discussed this verbally with Dr Mariea Clonts and per Dr. Mariea Clonts ok for Augementin. rx sent to pharmacy by e-script Spoke with [name] and advised results

## 2018-09-03 DIAGNOSIS — I25119 Atherosclerotic heart disease of native coronary artery with unspecified angina pectoris: Secondary | ICD-10-CM | POA: Diagnosis not present

## 2018-09-03 DIAGNOSIS — I10 Essential (primary) hypertension: Secondary | ICD-10-CM | POA: Diagnosis not present

## 2018-09-03 DIAGNOSIS — E785 Hyperlipidemia, unspecified: Secondary | ICD-10-CM | POA: Diagnosis not present

## 2018-09-03 DIAGNOSIS — E1159 Type 2 diabetes mellitus with other circulatory complications: Secondary | ICD-10-CM | POA: Diagnosis not present

## 2018-09-03 DIAGNOSIS — R634 Abnormal weight loss: Secondary | ICD-10-CM | POA: Diagnosis not present

## 2018-09-03 DIAGNOSIS — I5040 Unspecified combined systolic (congestive) and diastolic (congestive) heart failure: Secondary | ICD-10-CM | POA: Diagnosis not present

## 2018-09-06 DIAGNOSIS — I25119 Atherosclerotic heart disease of native coronary artery with unspecified angina pectoris: Secondary | ICD-10-CM | POA: Diagnosis not present

## 2018-09-06 DIAGNOSIS — R634 Abnormal weight loss: Secondary | ICD-10-CM | POA: Diagnosis not present

## 2018-09-06 DIAGNOSIS — E1159 Type 2 diabetes mellitus with other circulatory complications: Secondary | ICD-10-CM | POA: Diagnosis not present

## 2018-09-06 DIAGNOSIS — I5040 Unspecified combined systolic (congestive) and diastolic (congestive) heart failure: Secondary | ICD-10-CM | POA: Diagnosis not present

## 2018-09-06 DIAGNOSIS — E785 Hyperlipidemia, unspecified: Secondary | ICD-10-CM | POA: Diagnosis not present

## 2018-09-06 DIAGNOSIS — I10 Essential (primary) hypertension: Secondary | ICD-10-CM | POA: Diagnosis not present

## 2018-09-10 DIAGNOSIS — I10 Essential (primary) hypertension: Secondary | ICD-10-CM | POA: Diagnosis not present

## 2018-09-10 DIAGNOSIS — I5040 Unspecified combined systolic (congestive) and diastolic (congestive) heart failure: Secondary | ICD-10-CM | POA: Diagnosis not present

## 2018-09-10 DIAGNOSIS — K219 Gastro-esophageal reflux disease without esophagitis: Secondary | ICD-10-CM | POA: Diagnosis not present

## 2018-09-10 DIAGNOSIS — D649 Anemia, unspecified: Secondary | ICD-10-CM | POA: Diagnosis not present

## 2018-09-10 DIAGNOSIS — K222 Esophageal obstruction: Secondary | ICD-10-CM | POA: Diagnosis not present

## 2018-09-10 DIAGNOSIS — N401 Enlarged prostate with lower urinary tract symptoms: Secondary | ICD-10-CM | POA: Diagnosis not present

## 2018-09-10 DIAGNOSIS — R634 Abnormal weight loss: Secondary | ICD-10-CM | POA: Diagnosis not present

## 2018-09-10 DIAGNOSIS — I25119 Atherosclerotic heart disease of native coronary artery with unspecified angina pectoris: Secondary | ICD-10-CM | POA: Diagnosis not present

## 2018-09-10 DIAGNOSIS — H409 Unspecified glaucoma: Secondary | ICD-10-CM | POA: Diagnosis not present

## 2018-09-10 DIAGNOSIS — E1159 Type 2 diabetes mellitus with other circulatory complications: Secondary | ICD-10-CM | POA: Diagnosis not present

## 2018-09-10 DIAGNOSIS — N184 Chronic kidney disease, stage 4 (severe): Secondary | ICD-10-CM | POA: Diagnosis not present

## 2018-09-10 DIAGNOSIS — E785 Hyperlipidemia, unspecified: Secondary | ICD-10-CM | POA: Diagnosis not present

## 2018-09-11 DIAGNOSIS — E785 Hyperlipidemia, unspecified: Secondary | ICD-10-CM | POA: Diagnosis not present

## 2018-09-11 DIAGNOSIS — R634 Abnormal weight loss: Secondary | ICD-10-CM | POA: Diagnosis not present

## 2018-09-11 DIAGNOSIS — I10 Essential (primary) hypertension: Secondary | ICD-10-CM | POA: Diagnosis not present

## 2018-09-11 DIAGNOSIS — E1159 Type 2 diabetes mellitus with other circulatory complications: Secondary | ICD-10-CM | POA: Diagnosis not present

## 2018-09-11 DIAGNOSIS — I5040 Unspecified combined systolic (congestive) and diastolic (congestive) heart failure: Secondary | ICD-10-CM | POA: Diagnosis not present

## 2018-09-11 DIAGNOSIS — I25119 Atherosclerotic heart disease of native coronary artery with unspecified angina pectoris: Secondary | ICD-10-CM | POA: Diagnosis not present

## 2018-09-12 ENCOUNTER — Telehealth: Payer: Self-pay | Admitting: *Deleted

## 2018-09-12 DIAGNOSIS — E785 Hyperlipidemia, unspecified: Secondary | ICD-10-CM | POA: Diagnosis not present

## 2018-09-12 DIAGNOSIS — I10 Essential (primary) hypertension: Secondary | ICD-10-CM | POA: Diagnosis not present

## 2018-09-12 DIAGNOSIS — I5040 Unspecified combined systolic (congestive) and diastolic (congestive) heart failure: Secondary | ICD-10-CM | POA: Diagnosis not present

## 2018-09-12 DIAGNOSIS — I25119 Atherosclerotic heart disease of native coronary artery with unspecified angina pectoris: Secondary | ICD-10-CM | POA: Diagnosis not present

## 2018-09-12 DIAGNOSIS — R634 Abnormal weight loss: Secondary | ICD-10-CM | POA: Diagnosis not present

## 2018-09-12 DIAGNOSIS — E1159 Type 2 diabetes mellitus with other circulatory complications: Secondary | ICD-10-CM | POA: Diagnosis not present

## 2018-09-12 NOTE — Telephone Encounter (Signed)
[name] [name] with hospice calling to report a med error with pt, per hospice pt's wife gave patient seroquel 50 twice daily and it should be once daily.

## 2018-09-12 NOTE — Telephone Encounter (Signed)
Noted  

## 2018-09-13 DIAGNOSIS — I5040 Unspecified combined systolic (congestive) and diastolic (congestive) heart failure: Secondary | ICD-10-CM | POA: Diagnosis not present

## 2018-09-13 DIAGNOSIS — R634 Abnormal weight loss: Secondary | ICD-10-CM | POA: Diagnosis not present

## 2018-09-13 DIAGNOSIS — E1159 Type 2 diabetes mellitus with other circulatory complications: Secondary | ICD-10-CM | POA: Diagnosis not present

## 2018-09-13 DIAGNOSIS — I10 Essential (primary) hypertension: Secondary | ICD-10-CM | POA: Diagnosis not present

## 2018-09-13 DIAGNOSIS — I25119 Atherosclerotic heart disease of native coronary artery with unspecified angina pectoris: Secondary | ICD-10-CM | POA: Diagnosis not present

## 2018-09-13 DIAGNOSIS — E785 Hyperlipidemia, unspecified: Secondary | ICD-10-CM | POA: Diagnosis not present

## 2018-09-14 DIAGNOSIS — I10 Essential (primary) hypertension: Secondary | ICD-10-CM | POA: Diagnosis not present

## 2018-09-14 DIAGNOSIS — E1159 Type 2 diabetes mellitus with other circulatory complications: Secondary | ICD-10-CM | POA: Diagnosis not present

## 2018-09-14 DIAGNOSIS — I5040 Unspecified combined systolic (congestive) and diastolic (congestive) heart failure: Secondary | ICD-10-CM | POA: Diagnosis not present

## 2018-09-14 DIAGNOSIS — R634 Abnormal weight loss: Secondary | ICD-10-CM | POA: Diagnosis not present

## 2018-09-14 DIAGNOSIS — I25119 Atherosclerotic heart disease of native coronary artery with unspecified angina pectoris: Secondary | ICD-10-CM | POA: Diagnosis not present

## 2018-09-14 DIAGNOSIS — E785 Hyperlipidemia, unspecified: Secondary | ICD-10-CM | POA: Diagnosis not present

## 2018-09-15 ENCOUNTER — Other Ambulatory Visit: Payer: Self-pay | Admitting: Internal Medicine

## 2018-10-11 DEATH — deceased

## 2019-01-23 ENCOUNTER — Encounter: Payer: Self-pay | Admitting: Family

## 2019-03-10 ENCOUNTER — Ambulatory Visit: Payer: Self-pay

## 2019-03-10 ENCOUNTER — Encounter: Payer: Medicare Other | Admitting: Family

## 2019-03-11 ENCOUNTER — Encounter: Payer: Self-pay | Admitting: Family

## 2019-04-17 ENCOUNTER — Encounter: Payer: Self-pay | Admitting: Oncology
